# Patient Record
Sex: Female | Born: 1945 | Race: White | Hispanic: No | Marital: Married | Smoking: Former smoker
Health system: Southern US, Community
[De-identification: ages and names within clinical notes are randomized; demographics above are authoritative.]

## PROBLEM LIST (undated history)

## (undated) ENCOUNTER — Emergency Department (HOSPITAL_COMMUNITY): Admission: EM | Payer: Medicare Other | Source: Home / Self Care

## (undated) DIAGNOSIS — I509 Heart failure, unspecified: Secondary | ICD-10-CM

## (undated) DIAGNOSIS — T8859XA Other complications of anesthesia, initial encounter: Secondary | ICD-10-CM

## (undated) DIAGNOSIS — G4730 Sleep apnea, unspecified: Secondary | ICD-10-CM

## (undated) DIAGNOSIS — M8580 Other specified disorders of bone density and structure, unspecified site: Secondary | ICD-10-CM

## (undated) DIAGNOSIS — K219 Gastro-esophageal reflux disease without esophagitis: Secondary | ICD-10-CM

## (undated) DIAGNOSIS — M5431 Sciatica, right side: Secondary | ICD-10-CM

## (undated) DIAGNOSIS — D649 Anemia, unspecified: Secondary | ICD-10-CM

## (undated) DIAGNOSIS — M549 Dorsalgia, unspecified: Secondary | ICD-10-CM

## (undated) DIAGNOSIS — I4891 Unspecified atrial fibrillation: Secondary | ICD-10-CM

## (undated) DIAGNOSIS — Z5189 Encounter for other specified aftercare: Secondary | ICD-10-CM

## (undated) DIAGNOSIS — M1990 Unspecified osteoarthritis, unspecified site: Secondary | ICD-10-CM

## (undated) DIAGNOSIS — R7303 Prediabetes: Secondary | ICD-10-CM

## (undated) DIAGNOSIS — M5416 Radiculopathy, lumbar region: Secondary | ICD-10-CM

## (undated) DIAGNOSIS — S92302A Fracture of unspecified metatarsal bone(s), left foot, initial encounter for closed fracture: Secondary | ICD-10-CM

## (undated) DIAGNOSIS — Z950 Presence of cardiac pacemaker: Secondary | ICD-10-CM

## (undated) DIAGNOSIS — E213 Hyperparathyroidism, unspecified: Secondary | ICD-10-CM

## (undated) DIAGNOSIS — O009 Unspecified ectopic pregnancy without intrauterine pregnancy: Secondary | ICD-10-CM

## (undated) DIAGNOSIS — C541 Malignant neoplasm of endometrium: Secondary | ICD-10-CM

## (undated) DIAGNOSIS — I499 Cardiac arrhythmia, unspecified: Secondary | ICD-10-CM

## (undated) DIAGNOSIS — E785 Hyperlipidemia, unspecified: Secondary | ICD-10-CM

## (undated) DIAGNOSIS — I10 Essential (primary) hypertension: Secondary | ICD-10-CM

## (undated) DIAGNOSIS — I480 Paroxysmal atrial fibrillation: Secondary | ICD-10-CM

## (undated) DIAGNOSIS — J329 Chronic sinusitis, unspecified: Secondary | ICD-10-CM

## (undated) DIAGNOSIS — T7840XA Allergy, unspecified, initial encounter: Secondary | ICD-10-CM

## (undated) DIAGNOSIS — K589 Irritable bowel syndrome without diarrhea: Secondary | ICD-10-CM

## (undated) DIAGNOSIS — H269 Unspecified cataract: Secondary | ICD-10-CM

## (undated) DIAGNOSIS — F320 Major depressive disorder, single episode, mild: Secondary | ICD-10-CM

## (undated) DIAGNOSIS — R011 Cardiac murmur, unspecified: Secondary | ICD-10-CM

## (undated) HISTORY — DX: Dorsalgia, unspecified: M54.9

## (undated) HISTORY — PX: TOTAL KNEE ARTHROPLASTY: SHX125

## (undated) HISTORY — DX: Other specified disorders of bone density and structure, unspecified site: M85.80

## (undated) HISTORY — DX: Chronic sinusitis, unspecified: J32.9

## (undated) HISTORY — DX: Unspecified ectopic pregnancy without intrauterine pregnancy: O00.90

## (undated) HISTORY — DX: Sleep apnea, unspecified: G47.30

## (undated) HISTORY — DX: Fracture of unspecified metatarsal bone(s), left foot, initial encounter for closed fracture: S92.302A

## (undated) HISTORY — PX: CHOLECYSTECTOMY: SHX55

## (undated) HISTORY — DX: Irritable bowel syndrome without diarrhea: K58.9

## (undated) HISTORY — DX: Encounter for other specified aftercare: Z51.89

## (undated) HISTORY — DX: Malignant neoplasm of endometrium: C54.1

## (undated) HISTORY — DX: Allergy, unspecified, initial encounter: T78.40XA

## (undated) HISTORY — PX: NASAL SINUS SURGERY: SHX719

## (undated) HISTORY — PX: TONSILLECTOMY: SUR1361

## (undated) HISTORY — PX: ABDOMINAL HYSTERECTOMY: SHX81

## (undated) HISTORY — PX: TOTAL ABDOMINAL HYSTERECTOMY W/ BILATERAL SALPINGOOPHORECTOMY: SHX83

## (undated) HISTORY — PX: FINGER SURGERY: SHX640

## (undated) HISTORY — PX: DILATION AND CURETTAGE OF UTERUS: SHX78

## (undated) HISTORY — DX: Unspecified cataract: H26.9

## (undated) HISTORY — DX: Hyperparathyroidism, unspecified: E21.3

## (undated) HISTORY — DX: Sciatica, right side: M54.31

## (undated) HISTORY — DX: Cardiac murmur, unspecified: R01.1

## (undated) HISTORY — DX: Major depressive disorder, single episode, mild: F32.0

## (undated) HISTORY — DX: Radiculopathy, lumbar region: M54.16

## (undated) HISTORY — DX: Prediabetes: R73.03

## (undated) HISTORY — DX: Unspecified osteoarthritis, unspecified site: M19.90

## (undated) HISTORY — PX: FRACTURE SURGERY: SHX138

## (undated) HISTORY — DX: Anemia, unspecified: D64.9

## (undated) HISTORY — PX: JOINT REPLACEMENT: SHX530

## (undated) HISTORY — PX: EYE SURGERY: SHX253

---

## 1997-12-04 ENCOUNTER — Ambulatory Visit (HOSPITAL_COMMUNITY): Admission: RE | Admit: 1997-12-04 | Discharge: 1997-12-04 | Payer: Self-pay | Admitting: *Deleted

## 1999-01-14 ENCOUNTER — Ambulatory Visit (HOSPITAL_COMMUNITY): Admission: RE | Admit: 1999-01-14 | Discharge: 1999-01-14 | Payer: Self-pay | Admitting: *Deleted

## 2000-02-22 ENCOUNTER — Ambulatory Visit (HOSPITAL_COMMUNITY): Admission: RE | Admit: 2000-02-22 | Discharge: 2000-02-22 | Payer: Self-pay | Admitting: Neurosurgery

## 2000-02-22 ENCOUNTER — Encounter: Payer: Self-pay | Admitting: Neurosurgery

## 2000-03-07 ENCOUNTER — Ambulatory Visit (HOSPITAL_COMMUNITY): Admission: RE | Admit: 2000-03-07 | Discharge: 2000-03-07 | Payer: Self-pay | Admitting: Neurosurgery

## 2000-03-07 ENCOUNTER — Encounter: Payer: Self-pay | Admitting: Neurosurgery

## 2000-03-21 ENCOUNTER — Encounter: Payer: Self-pay | Admitting: Neurosurgery

## 2000-03-21 ENCOUNTER — Ambulatory Visit (HOSPITAL_COMMUNITY): Admission: RE | Admit: 2000-03-21 | Discharge: 2000-03-21 | Payer: Self-pay | Admitting: Neurosurgery

## 2000-04-11 ENCOUNTER — Encounter (INDEPENDENT_AMBULATORY_CARE_PROVIDER_SITE_OTHER): Payer: Self-pay

## 2000-04-11 ENCOUNTER — Other Ambulatory Visit: Admission: RE | Admit: 2000-04-11 | Discharge: 2000-04-11 | Payer: Self-pay | Admitting: *Deleted

## 2001-05-28 ENCOUNTER — Encounter: Payer: Self-pay | Admitting: Family Medicine

## 2001-05-28 ENCOUNTER — Encounter: Admission: RE | Admit: 2001-05-28 | Discharge: 2001-05-28 | Payer: Self-pay | Admitting: Family Medicine

## 2001-07-07 ENCOUNTER — Emergency Department (HOSPITAL_COMMUNITY): Admission: EM | Admit: 2001-07-07 | Discharge: 2001-07-07 | Payer: Self-pay | Admitting: Emergency Medicine

## 2002-01-02 DIAGNOSIS — C541 Malignant neoplasm of endometrium: Secondary | ICD-10-CM

## 2002-01-02 HISTORY — DX: Malignant neoplasm of endometrium: C54.1

## 2002-03-05 ENCOUNTER — Encounter: Admission: RE | Admit: 2002-03-05 | Discharge: 2002-03-05 | Payer: Self-pay | Admitting: *Deleted

## 2002-04-08 ENCOUNTER — Other Ambulatory Visit: Admission: RE | Admit: 2002-04-08 | Discharge: 2002-04-08 | Payer: Self-pay | Admitting: *Deleted

## 2002-05-02 ENCOUNTER — Encounter (INDEPENDENT_AMBULATORY_CARE_PROVIDER_SITE_OTHER): Payer: Self-pay | Admitting: *Deleted

## 2002-05-02 ENCOUNTER — Ambulatory Visit (HOSPITAL_COMMUNITY): Admission: RE | Admit: 2002-05-02 | Discharge: 2002-05-02 | Payer: Self-pay | Admitting: *Deleted

## 2002-05-21 ENCOUNTER — Encounter: Admission: RE | Admit: 2002-05-21 | Discharge: 2002-05-21 | Payer: Self-pay | Admitting: Internal Medicine

## 2002-05-21 ENCOUNTER — Encounter: Payer: Self-pay | Admitting: Internal Medicine

## 2002-05-21 ENCOUNTER — Encounter: Admission: RE | Admit: 2002-05-21 | Discharge: 2002-05-21 | Payer: Self-pay | Admitting: *Deleted

## 2002-06-06 ENCOUNTER — Inpatient Hospital Stay (HOSPITAL_COMMUNITY): Admission: RE | Admit: 2002-06-06 | Discharge: 2002-06-09 | Payer: Self-pay | Admitting: *Deleted

## 2002-06-06 ENCOUNTER — Encounter (INDEPENDENT_AMBULATORY_CARE_PROVIDER_SITE_OTHER): Payer: Self-pay | Admitting: Specialist

## 2002-07-08 ENCOUNTER — Ambulatory Visit: Admission: RE | Admit: 2002-07-08 | Discharge: 2002-07-08 | Payer: Self-pay | Admitting: Gynecology

## 2002-10-21 ENCOUNTER — Other Ambulatory Visit: Admission: RE | Admit: 2002-10-21 | Discharge: 2002-10-21 | Payer: Self-pay | Admitting: *Deleted

## 2002-12-29 ENCOUNTER — Encounter: Admission: RE | Admit: 2002-12-29 | Discharge: 2002-12-29 | Payer: Self-pay | Admitting: *Deleted

## 2003-01-13 ENCOUNTER — Other Ambulatory Visit: Admission: RE | Admit: 2003-01-13 | Discharge: 2003-01-13 | Payer: Self-pay | Admitting: *Deleted

## 2003-04-06 ENCOUNTER — Encounter: Admission: RE | Admit: 2003-04-06 | Discharge: 2003-04-06 | Payer: Self-pay | Admitting: *Deleted

## 2003-04-09 ENCOUNTER — Other Ambulatory Visit: Admission: RE | Admit: 2003-04-09 | Discharge: 2003-04-09 | Payer: Self-pay | Admitting: *Deleted

## 2003-07-03 ENCOUNTER — Encounter: Admission: RE | Admit: 2003-07-03 | Discharge: 2003-07-03 | Payer: Self-pay | Admitting: *Deleted

## 2003-10-11 ENCOUNTER — Emergency Department (HOSPITAL_COMMUNITY): Admission: EM | Admit: 2003-10-11 | Discharge: 2003-10-11 | Payer: Self-pay | Admitting: Emergency Medicine

## 2004-01-05 ENCOUNTER — Encounter (HOSPITAL_COMMUNITY): Admission: RE | Admit: 2004-01-05 | Discharge: 2004-04-04 | Payer: Self-pay | Admitting: *Deleted

## 2004-05-03 ENCOUNTER — Encounter: Admission: RE | Admit: 2004-05-03 | Discharge: 2004-05-03 | Payer: Self-pay | Admitting: *Deleted

## 2004-05-12 ENCOUNTER — Ambulatory Visit (HOSPITAL_COMMUNITY): Admission: RE | Admit: 2004-05-12 | Discharge: 2004-05-12 | Payer: Self-pay | Admitting: *Deleted

## 2005-02-22 ENCOUNTER — Encounter: Admission: RE | Admit: 2005-02-22 | Discharge: 2005-02-22 | Payer: Self-pay | Admitting: Internal Medicine

## 2005-05-31 ENCOUNTER — Ambulatory Visit (HOSPITAL_COMMUNITY): Admission: RE | Admit: 2005-05-31 | Discharge: 2005-05-31 | Payer: Self-pay | Admitting: *Deleted

## 2005-06-29 ENCOUNTER — Encounter: Admission: RE | Admit: 2005-06-29 | Discharge: 2005-06-29 | Payer: Self-pay | Admitting: *Deleted

## 2005-07-11 ENCOUNTER — Encounter: Admission: RE | Admit: 2005-07-11 | Discharge: 2005-10-09 | Payer: Self-pay | Admitting: Orthopedic Surgery

## 2006-04-03 ENCOUNTER — Ambulatory Visit (HOSPITAL_COMMUNITY): Admission: RE | Admit: 2006-04-03 | Discharge: 2006-04-03 | Payer: Self-pay | Admitting: Obstetrics & Gynecology

## 2006-07-25 ENCOUNTER — Encounter: Admission: RE | Admit: 2006-07-25 | Discharge: 2006-07-25 | Payer: Self-pay | Admitting: Obstetrics & Gynecology

## 2006-11-05 ENCOUNTER — Inpatient Hospital Stay (HOSPITAL_COMMUNITY): Admission: RE | Admit: 2006-11-05 | Discharge: 2006-11-08 | Payer: Self-pay | Admitting: Orthopedic Surgery

## 2006-11-27 ENCOUNTER — Encounter: Admission: RE | Admit: 2006-11-27 | Discharge: 2007-01-02 | Payer: Self-pay | Admitting: Orthopedic Surgery

## 2007-01-07 ENCOUNTER — Encounter: Admission: RE | Admit: 2007-01-07 | Discharge: 2007-04-07 | Payer: Self-pay | Admitting: Orthopedic Surgery

## 2007-04-10 ENCOUNTER — Ambulatory Visit (HOSPITAL_COMMUNITY): Admission: RE | Admit: 2007-04-10 | Discharge: 2007-04-10 | Payer: Self-pay | Admitting: Orthopedic Surgery

## 2007-06-18 ENCOUNTER — Ambulatory Visit (HOSPITAL_COMMUNITY): Admission: RE | Admit: 2007-06-18 | Discharge: 2007-06-18 | Payer: Self-pay | Admitting: Obstetrics & Gynecology

## 2007-10-10 ENCOUNTER — Encounter: Admission: RE | Admit: 2007-10-10 | Discharge: 2007-10-10 | Payer: Self-pay | Admitting: Obstetrics & Gynecology

## 2007-11-26 ENCOUNTER — Ambulatory Visit (HOSPITAL_COMMUNITY): Admission: RE | Admit: 2007-11-26 | Discharge: 2007-11-26 | Payer: Self-pay | Admitting: Gastroenterology

## 2008-10-28 ENCOUNTER — Encounter: Admission: RE | Admit: 2008-10-28 | Discharge: 2008-10-28 | Payer: Self-pay | Admitting: Obstetrics & Gynecology

## 2009-11-11 ENCOUNTER — Encounter: Admission: RE | Admit: 2009-11-11 | Discharge: 2009-11-11 | Payer: Self-pay | Admitting: Obstetrics & Gynecology

## 2010-05-17 NOTE — Discharge Summary (Signed)
Veronica Bradford, Veronica Bradford                 ACCOUNT NO.:  [id number]   MEDICAL RECORD NO.:  [id number]          PATIENT TYPE:  INP   LOCATION:  1606                         FACILITY:  [facility]   PHYSICIAN:  Ollen Gross, M.D.    DATE OF BIRTH:  Sep 19, 1945   DATE OF ADMISSION:  11/05/2006  DATE OF DISCHARGE:  11/08/2006                               DISCHARGE SUMMARY   ADMITTING DIAGNOSES:  1. Osteoarthritis right knee.  2. History of mild migraines.  3. Hypertension.  4. Hiatal hernia.  5. Reflux disease.  6. Past history of fluid retention following abdominal surgery.  7. Surgical menopause.   DISCHARGE DIAGNOSES:  1. Osteoarthritis right knee status post right total knee replacement      arthroplasty.  2. Mild postop blood loss anemia, did not require transfusion.  3. Postop volume overload, improved.  4. History of mild migraines.  5. Hypertension.  6. Hiatal hernia.  7. Reflux disease.  8. Past history of fluid retention following abdominal surgery.  9. Surgical menopause.   PROCEDURE:  November 05, 2006, right total knee.  Surgeon, Dr. Lequita Halt.  Assistant Avel Peace PA-C.   ANESTHESIA:  General.   CONSULTS:  None.   BRIEF HISTORY:  The patient is a 65 year old female with severe end-  stage arthritis of the right knee with progressive worsening pain and  dysfunction, failed non-operative management now presents for total knee  arthroplasty.   LABORATORY DATA:  Preop CBC showed hemoglobin 13.9, hematocrit 41.0,  white cell count 6.1. Chem panel slightly low potassium at 3.4.  Remaining Chem panel all within normal limits.  PT/INR preop 12.9 and  1.0 and PTT of 36.  Preop UA negative.  Serial CBCs were followed.  Hemoglobin did drop down to 11.3 then 10.2.  Last noted H and H 10.0 and  29.0.  Serial B-Mets were followed.  Potassium came up to 4.5, back down  to 3.7.   Chest x-ray October 30, 2006:  No active cardiopulmonary disease.   HOSPITAL COURSE:  The patient was  admitted to [facility],  tolerated procedure well.  Later transferred to the recovery room,  orthopedic floor, started on PCA and p.o. analgesic pain control after  surgery.  Actually was doing very well on the morning of day one.  She  is already doing straight leg raises.  Anticipate that she would do  really well.  She was noted to have a large volume overload over 3  liters.  Put her on strict Is and Os and started diuresing some of the  fluid.  She had a history of volume overload in the past with likely  third spacing so we wanted to keep her from getting into any pulmonary  issues.  Started diuresing and her output actually was very good.  She  was on lisinopril.  Her BUN and creatinine were fine.  We set that on  parameters, resumed her omeprazole.  Starting to get up out of bed on  day one.  By day two she was even doing better.  She had a rough night  on day one I think because she was up and about, a little bit better on  the morning of day two.  She developed migraines.  Started back on her  Imitrex.  DC the PCA, fluids and the Foley.  Dressing changed.  Incision  looked good.  She was able to get up and about with PT, walked about 120  feet by day three.  Migraine headache had improved.  She was doing well.  Incision looked good.  Weaned over to p.o. meds and discharged home.   DISCHARGE/PLAN:  1. The patient was discharged home on November 08, 2006.  2. Discharge diagnoses:  Please see above.  3. Discharge medications: Coumadin, Percocet, Robaxin.   DIET:  Resume home diet.   ACTIVITY:  She is weightbearing as tolerated right lower extremity.  Home health PT, home health nursing total knee protocol, daily dressing  change, do not submerge incision under water.  Follow-up 2 weeks.   DISPOSITION:  Home.   CONDITION ON DISCHARGE:  Improved.      Alexzandrew L. Perkins, P.A.C.      Ollen Gross, M.D.  Electronically Signed    ALP/MEDQ  D:  11/08/2006   T:  11/08/2006  Job:  [id number]   cc:   Ollen Gross, M.D.  Fax: [phone]   Theressa Millard, M.D.  Fax: [phone]

## 2010-05-17 NOTE — H&P (Signed)
Veronica Bradford, Veronica Bradford                 ACCOUNT NO.:  [id number]   MEDICAL RECORD NO.:  [id number]          PATIENT TYPE:  INP   LOCATION:  NA                           FACILITY:  [facility]   PHYSICIAN:  Ollen Gross, M.D.    DATE OF BIRTH:  1945/01/04   DATE OF ADMISSION:  11/05/2006  DATE OF DISCHARGE:                              HISTORY & PHYSICAL   CHIEF COMPLAINT:  Right knee pain.   HISTORY OF PRESENT ILLNESS:  The patient is a 65 year old female who has  been seen by Dr. Lequita Halt for ongoing right knee pain.  She has been  treated for knee problems for multiple years now.  She has had a knee  arthroscopy about 10 years ago.  For the past 2-3 years her knees have  been getting worse.  She has had injections including viscous  supplementation without significant relief.  She is found in the office  to have significant arthritis with bone-on-bone medial compartment also  patellofemoral spurring.  It is felt she has reached a point where she  would benefit undergoing surgery.  Risks and benefits been discussed.  She elects to proceed with surgery.   ALLERGIES:  PERCOGESIC CAUSES RASH.  AMOXICILLIN CAUSES RASH.   CURRENT MEDICATIONS:  1. Lisinopril/HCT, lovastatin, aspirin, Vicodin, guaifenesin,      omeprazole, Flonase nose spray.   PAST MEDICAL HISTORY:  1. History of mild migraines.  2. Hypertension.  3. Hiatal hernia.  4 . Reflux disease.  1. Past history of fluid retention following one of her abdominal      surgeries.  2. Surgical menopause.   PAST SURGICAL HISTORY:  1. Right knee surgery.  2. Hysterectomy with BSO.  3. Cholecystectomy.  4. Tonsils/adenoids.   SOCIAL HISTORY:  Married.  She is a Engineer, civil (consulting).  Works at [employer].  [place].  Four ounces of alcohol 3-5 times a week.  One child.  Husband and son will be assisting with care after surgery.   FAMILY HISTORY:  Mother with breast cancer and hypertension.  Father  with hypertension, diabetes.   REVIEW OF  SYSTEMS:  GENERAL:  No fevers, chills, night sweats.  NEUROLOGICAL:  No seizures, syncope, paralysis.  RESPIRATORY:  No  shortness of breath, productive cough or hemoptysis.  CARDIOVASCULAR:  No chest pain, or orthopnea.  GI:  No nausea or constipation.  GU: No  dysuria, hematuria or discharge.  MUSCULOSKELETAL:  Right knee.   PHYSICAL EXAMINATION:  VITAL SIGNS:  Pulse 72, respirations 12, blood  pressure 136/74.  GENERAL:  A 65 year old white female, well-nourished, well-developed, no  acute distress.  She is overweight, obese, alert, oriented, cooperative,  very pleasant, excellent historian.  HEENT: Normocephalic, atraumatic.  Pupils are reactive.  Oropharynx  clear.  EOMs intact.  NECK:  Supple.  No bruits.  CHEST:  Clear anterior posterior chest walls.  No rhonchi, rales or  wheezing.  HEART:  Regular rate and rhythm.  No murmur.  ABDOMEN:  Soft.  Bowel sounds present, nontender.  RECTAL/BREASTS/GENITALIA:  Not done, not pertinent to present illness.  EXTREMITIES:  Right knee no effusion,  marked crepitus is noted.  Range  of motion 5-125, tender more medial than lateral.   IMPRESSION:  1. Osteoarthritis right knee.  2. History of mild migraines.  3. Hypertension.  4. Hiatal hernia.  5. Reflux disease.  6. Past history of fluid retention following abdominal surgery.  7. Surgical menopause.   PLAN:  The patient was admitted to [facility] to undergo a  right total knee replacement arthroplasty.  Surgery will be performed by  Ollen Gross.      Alexzandrew L. Perkins, P.A.C.      Ollen Gross, M.D.  Electronically Signed    ALP/MEDQ  D:  10/31/2006  T:  10/31/2006  Job:  [id number]   cc:   Theressa Millard, M.D.  Fax: [phone]   Ollen Gross, M.D.  Fax: [phone]

## 2010-05-17 NOTE — Op Note (Signed)
[name], Bradford                 ACCOUNT NO.:  [id number]   MEDICAL RECORD NO.:  [id number]          PATIENT TYPE:  INP   LOCATION:  0004                         FACILITY:  [facility]   PHYSICIAN:  Ollen Gross, M.D.    DATE OF BIRTH:  21-May-1945   DATE OF PROCEDURE:  11/05/2006  DATE OF DISCHARGE:                               OPERATIVE REPORT   PREOPERATIVE DIAGNOSIS:  Osteoarthritis right knee.   POSTOPERATIVE DIAGNOSIS:  Osteoarthritis right knee.   PROCEDURE:  Right total knee arthroplasty.   SURGEON:  Ollen Gross, M.D.   ASSISTANT:  Avel Peace PA-C   ANESTHESIA:  General with postop Marcaine pain pump.   ESTIMATED BLOOD LOSS:  Minimal.   DRAINS:  None.   TOURNIQUET TIME:  40 minutes at 300 mmHg.   COMPLICATIONS:  None.   CONDITION:  Stable to recovery.   BRIEF CLINICAL NOTE:  Veronica Bradford is a 65 year old female who has severe end-  stage arthritis of the right knee with progressively worsening pain and  dysfunction.  She has failed nonoperative management and presents for  total knee arthroplasty.   PROCEDURE IN DETAIL:  After successful administration of general  anesthetic, a tourniquet was placed high on the right thigh and her  right lower extremity prepped and draped in usual sterile fashion.  Extremity was wrapped in Esmarch, knee flexed, tourniquet inflated to  300 mmHg.  Midline incision made with a 10 blade through subcutaneous  tissue to level the extensor mechanism.  A fresh blade is used make a  medial parapatellar arthrotomy.  Soft tissue over the proximal and  medial tibia subperiosteally elevated to the joint line with the knife  and into the semimembranosus bursa with a Cobb elevator.  Soft tissue  laterally was elevated with attention being paid to avoid the patellar  tendon on tibial tubercle.  The patella subluxed laterally, knee flexed  90 degrees, ACL and PCL removed.  Drill was used to create a starting  hole in distal femur and the canal was  thoroughly irrigated.  The 5  degree right valgus alignment guide is placed referencing off the  posterior condyles, rotations marked and a block pinned to remove 11 mm  of the distal femur.  I took 11 because she had a preop flexion  contracture.  Distal femoral resection is made with an oscillating saw.  Sizing blocks placed, size 3 is most appropriate.  Rotations marked at  the epicondylar axis.  Size 3 cutting block was placed and the anterior-  posterior and chamfer cuts were made.   Tibia subluxed forward and the menisci are removed.  Extramedullary  tibial alignment guides placed referencing proximally at the medial  aspect of the tibial tubercle and distally along the second metatarsal  axis and tibial crest.  The block is pinned to remove about 10 mm of the  non deficient lateral side.  Tibial resection is made with an  oscillating saw.  Size 3 is the most appropriate tibial component and  the proximal tibia is prepared with a modular drill and keel punch for a  size  3.  Femoral preparation is completed with the intercondylar cut.   Size 3 mobile bearing tibial trial, size 3 posterior stabilized femoral  trial and a 10-mm posterior stabilized rotating platform insert trial  are placed.  With a 20 there is a tiny bit of varus-valgus plane and a  tiny bit of hyperextension.  With a 12.5 which allowed for full  extension with excellent varus and valgus balance throughout full range  of motion.  This also has excellent AP balance.  The patella was everted  and thickness measured to be 22 mm.  Freehand resection taken to 12 mm,  35 template is placed, lug holes were drilled, trial patella was placed  and it tracks normally.  Osteophytes were removed off the posterior  femur with a trial in place.  All trials removed and the cut bone  surfaces are prepared with pulsatile lavage.  Cement was mixed and once  ready for implantation, the size 3 mobile bearing tibial tray, size 3   posterior stabilized femur and the 35 patella are cemented into position  and patella is held with the clamp.  The trial 12.5-mm inserts placed,  knee held in full extension and all extruded cement removed.  When the  cement fully hardened then the permanent 12.5 mm posterior stabilized  rotating platform insert is placed into the tibial tray.  The wound was  copiously irrigated with saline solution and the FloSeal was injected  onto the posterior capsule into the mediolateral gutters and  suprapatellar area.  Moist sponge was placed and the tourniquet released  with total time of 40 minutes.  We held the sponge of couple minutes  then removed it and there was minimal bleeding encountered.  That which  was encountered was stopped with electrocautery.  We irrigated the joint  and then closed the arthrotomy with interrupted #1 PDS.  Flexion against  gravity to 135 degrees.  Subcu was closed with interrupted 2-0 Vicryl  and subcuticular running 4-0 Monocryl.  The catheter for Marcaine pain  pump is placed and the pump initiated.  Steri-Strips and bulky sterile  dressing are applied and she is placed into a knee immobilizer, awakened  and transferred to recovery in stable condition.      Ollen Gross, M.D.  Electronically Signed     FA/MEDQ  D:  11/05/2006  T:  11/06/2006  Job:  [id number]

## 2010-05-17 NOTE — Op Note (Signed)
NAMEMELISSE, CAETANO                 ACCOUNT NO.:  [id number]   MEDICAL RECORD NO.:  [id number]          PATIENT TYPE:  AMB   LOCATION:  ENDO                         FACILITY:  [facility]   PHYSICIAN:  Petra Kuba, M.D.    DATE OF BIRTH:  Sep 03, 1945   DATE OF PROCEDURE:  DATE OF DISCHARGE:                               OPERATIVE REPORT   PROCEDURE:  Colonoscopy.   INDICATIONS:  Family history of colon cancer due for colonic screening.  Consent was signed after risks, benefits, methods and options thoroughly  discussed by Dr. Matthias Hughs prior to any sedation.   MEDICINES USED:  Fentanyl 100 mcg, Versed 10 mg.   DESCRIPTION OF PROCEDURE:  Rectal inspection was pertinent for small  external hemorrhoids.  Digital exam was negative.  The video colonoscope  was inserted and with some difficulty due to a long looping colon.  With  rolling her on her back and abdominal pressure was able to advance to  the cecum which was identified by the appendiceal orifice and ileocecal  valve.  On insertion, some left-sided scattered diverticula were seen  but no other abnormalities.  The scope was slowly withdrawn.  The prep  was adequate.  There was some liquid stool that required washing and  suctioning.  On slow withdrawal through the colon other than left-sided  diverticula, no polyps, tumors or masses were seen.  Once back in the  rectum, anorectal pull-through and retroflexion confirmed some small  hemorrhoids.  The scope was straightened and readvanced a short ways up  the left side of the colon.  Air was suctioned, the scope removed.  The  patient tolerated the procedure well.  There was no obvious immediate  complication.   ENDOSCOPIC DIAGNOSES:  1. Internal-external small hemorrhoids.  2. Left-sided diverticula to a proximal splenic flexure.  3. Otherwise within normal limits to the cecum.   PLAN:  Follow-up with either myself or [facility] p[facility].  Otherwise, repeat screening in 5  years.  Otherwise, return care to Dr.  Earl Gala.           ______________________________  Petra Kuba, M.D.     MEM/MEDQ  D:  11/26/2007  T:  11/26/2007  Job:  [id number]   cc:   Bernette Redbird, M.D.  Fax: [phone]   Theressa Millard, M.D.  Fax: [phone]

## 2010-05-20 NOTE — Op Note (Signed)
NAMERHYAN, WOLTERS                             ACCOUNT NO.:  [id number]   MEDICAL RECORD NO.:  [id number]                   PATIENT TYPE:  AMB   LOCATION:  DAY                                  FACILITY:  [facility]   PHYSICIAN:  Pershing Cox, M.D.            DATE OF BIRTH:  [birth year]   DATE OF PROCEDURE:  05/02/2002  DATE OF DISCHARGE:                                 OPERATIVE REPORT   PREOPERATIVE DIAGNOSES:  Dysfunctional uterine bleeding, enlarging uterine  myomas.   POSTOPERATIVE DIAGNOSES:  Dysfunctional uterine bleeding, enlarging uterine  myomas and submucosal myomas.   ANESTHESIA:  MAC plus Marcaine paracervical block.   SURGEON:  Pershing Cox, M.D.   INDICATIONS FOR PROCEDURE:  Veronica Bradford is 65 years old. She has begun to  have menstrual cycle irregularity and when seen for her annual examination,  her history revealed that she had bled almost the entire month of March. She  has a long history of dysfunctional uterine bleeding and had a normal biopsy  in the past. She had regular menstrual periods every other month in 2002 and  2003. Her last normal menstrual period was in December and no bleeding until  March when she began to have excessive menstrual bleeding. Sonogram showed a  uterus which was enlarged from previous studies and had a new uterine myoma.  The endometrial stripe was thickened. A decision was made to proceed with  D&C hysteroscopy to evaluate her uterine lining in preparation for  anticipated surgery.   FINDINGS:  Examination under anesthesia shows the uterus to be approximately  14 weeks in size and irregular in shape. The endometrial cavity sounds to 11  1/2 cm. On curettage, the walls of the endometrial canal are irregular  consistent with submucosal myoma.   DESCRIPTION OF PROCEDURE:  Veronica Bradford was brought to the operating room  with an IV in place. She received an gram of Ancef in the holding area. She  was placed supine on the OR  table and IV sedation was administered. She was  placed in Buxton stirrups and examination under anesthesia was performed.  Hibiclens was used to prep the vagina, perineum, lower abdomen, and upper  thigh. The patient was then draped with sterile linen for a vaginal  procedure. A collecting drape was placed beneath the buttock so there would  measured affluent during the procedure.   A large bivalve Graves speculum was inserted into the vagina, cervix was  visualized and anterior cervix was anesthetized with 0.25% Marcaine. A  single tooth tenaculum was used to grasp the anterior cervix. A Kevorkian  curette was used to obtain endocervical curetting. The uterine sound then  passed to a depth of 11-1/2 cm with 4-3/4 inches. Serial Pratt dilators were  used to dilate the cervix. It should be noted the cervix was very soft.  There was no difficulty with the dilatation. The hysteroscope was  introduced  into the cavity and attempt was made to distend the cavity. There was a huge  amount of tissue that was visualized and the cavity could not be distended  using normal pressures. Affluent was turned off and the pressure was raised  to 90 and still I could not get good visualization of the uterine fundus  because of the extensive amount of tissue. Hysteroscopy was aborted. Small  and large sharp curettes were used then to obtain tissue from the walls of  the endometrium at first using long strips and then exploration. The walls  both anterior and posterior showed irregularities which were suggestive of  submucosal myoma. No attempt was made to revisualize. The sound passed again  to a depth of 11 1/2 cm and the procedure was terminated.   Sterile urine was collected at the beginning of the procedure using a red  rubber catheter. This is to assess her urine as she has a low grade  temperature and an elevated white count of 11,000.                                               Pershing Cox,  M.D.    MAJ/MEDQ  D:  05/02/2002  T:  05/02/2002  Job:  [id number]

## 2010-05-20 NOTE — Consult Note (Signed)
NAME:  Veronica Bradford, Veronica Bradford                     ACCOUNT NO.:  [id number]   MEDICAL RECORD NO.:  [id number]                   PATIENT TYPE:  OUT   LOCATION:  GYN                                  FACILITY:  [facility]   PHYSICIAN:  De Blanch, M.D.         DATE OF BIRTH:  [birth year]   DATE OF CONSULTATION:  07/08/2002  DATE OF DISCHARGE:                                   CONSULTATION   REASON FOR CONSULTATION:  The patient is a 65 year old white female seen in  consultation at the request of Dr. Gildardo Griffes.  Her past history is  outlined in Dr. Argentina Ponder very detailed letter of June 16, 2002.  Because of  uncontrolled bleeding, the patient underwent a total abdominal hysterectomy,  bilateral salpingo-oophorectomy, peritoneal washings, and on June 06, 2002,  at the same time she had a cholecystectomy with findings of chronic  cholecystitis and cholelithiasis.  Initially, the cytology of the peritoneal  fluid was interpreted as showing malignant cells, although initial  interpretation of the hysterectomy specimen was considered to show complex  hyperplasia without atypia.  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  Because of the uncertainty as to this constellation of diagnoses, the  pathology was reviewed at [facility] by Dr. Ursula Beath  and his colleagues.  Dr. 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   IMPRESSION:  Stage 1A grade 1 endometrial adenocarcinoma.   PLAN:  I had a lengthy discussion with the patient and her husband regarding  the difference in the pathology reports and interpretations.  Given the  favorable report from [facility], I believe we should  manage the patient as if she had a stage 1A grade 1 endometrial cancer,  although closer surveillance  would be reasonable, including the possibility  of repeating a CAT scan or peritoneal washings at some point in the future.  I did explain to them the likelihood that cholecystitis might result in  reactive mesothelial cells which might be interpreted as atypical or  malignant.  The subtle differences between hyperplasia and endometrial  cancer were also discussed.  At the conclusion of the consultation, the  patient and her husband's questions were answered.  They will return to the  care of Dr. Carey Bullocks and will follow her recommendations.                                               De Blanch, M.D.    DC/MEDQ  D:  07/08/2002  T:  07/08/2002  Job:  [id number]   cc:   Pershing Cox, M.D.  [address]  [address]  [place]  [place] [address]  Fax: [phone]  Telford Nab, R.N.  [address]  [place], [place] [address]   Sandria Bales. Ezzard Standing, M.D.  [address]  [place]  [place] [address]  Fax: [phone]

## 2010-05-20 NOTE — Discharge Summary (Signed)
Veronica Bradford                             ACCOUNT NO.:  [id number]   MEDICAL RECORD NO.:  [id number]                   PATIENT TYPE:  INP   LOCATION:  7673                                 FACILITY:  [facility]   PHYSICIAN:  Pershing Cox, M.D.            DATE OF BIRTH:  02/06/1945   DATE OF ADMISSION:  06/06/2002  DATE OF DISCHARGE:  06/09/2002                                 DISCHARGE SUMMARY   ADMISSION DIAGNOSES:  Menorrhagia and uterine myomas.   HISTORY OF PRESENT ILLNESS:  For details of the patient's admission history  and physical, see the note transcribed and dated June 06, 2002. In brief, she  is 65 years old. She has a long history of dysfunctional uterine bleeding  and recently this has become quite heavy with the development of anemia. She  has been treated with Lupron and Progesterone to control this bleeding. She  is brought to the operating room today to have her uterus, fallopian tubes,  and ovaries removed. She has a family history of ovarian cancer. The patient  also had an abdominal pelvic CT scan performed by her family doctor which  showed gallstones. She has elected to undergo a laparoscopic cholecystectomy  prior to her hysterectomy.   HOSPITAL COURSE:  On the day of admission, Verenis Nicosia was taken to the  operating room and, under general anesthesia, laparoscopic cholecystectomy,  exploratory laparotomy, total abdominal hysterectomy, right salpingo-  oophorectomy were performed. The patient had a 12 week globular uterus with  frozen section suggesting adenomyosis.   The patient's immediate postoperative course was uncomplicated. She had good  urine output on the first postoperative day. Her pain control was adequate.  On the morning of postoperative day #1, I began Toradol. The patient was  doing well then except for a migraine headache which responded to nasal  Imitrex. She began on sips of clear liquids that day. The patient had a  significant  intraoperative blood loss but had no problems in the  postoperative period with evidence of blood loss.   Once she began to take sips of water and then advancing to clear liquids,  she was able to advance to a diet quite rapidly. She was able to tolerate  p.o. pain medication without difficulty. On postoperative day #2, she had  some drainage from her incision. This was prepped and probed and felt to be  a wound seroma.   When seen on postoperative day #3, she complained of low back pain but,  otherwise, no other problems. She had had flatus. She had no nausea.   DISPOSITION:  She was allowed to be discharged on postoperative day #3 with  prescriptions for Peri-Colace while on Tylox #40, Darvocet #40, Toradol 10  mg #6 and the Vivelle-Dot for estrogen replacement. Final pathology is  pending at the time of this dictation.  Pershing Cox, M.D.    MAJ/MEDQ  D:  06/13/2002  T:  06/14/2002  Job:  [id number]

## 2010-05-20 NOTE — Op Note (Signed)
Veronica Bradford                             ACCOUNT NO.:  [id number]   MEDICAL RECORD NO.:  [id number]                   PATIENT TYPE:  INP   LOCATION:  X001                                 FACILITY:  [facility]   PHYSICIAN:  Pershing Cox, M.D.            DATE OF BIRTH:  [birth year]   DATE OF PROCEDURE:  06/06/2002  DATE OF DISCHARGE:                                 OPERATIVE REPORT   PREOPERATIVE DIAGNOSES:  Intractable menorrhagia and uterine myoma.   POSTOPERATIVE DIAGNOSES:  Intractable menorrhagia, presumed adenomyosis.   PROCEDURE:  Exploratory laparotomy, total abdominal hysterectomy, right  salpingo-oophorectomy.   ANESTHESIA:  General endotracheal.   SURGEON:  Pershing Cox, M.D.   ASSISTANT:  Genia Del, M.D.   INDICATIONS FOR PROCEDURE:  Veronica Bradford is 65 years old. She has a long history of  dysfunctional uterine bleeding and it has lead to anemia on several  occasions. She is admitted today for total abdominal hysterectomy and right  salpingo-oophorectomy.   Veronica Bradford was brought to the operating room by Dr. Ovidio Kin who was  performing a laparoscopic cholecystectomy. She was prepped with Hibiclens.  Her vagina was prepped and a Foley catheter was sterilely inserted into her  bladder by the surgeon. Laparoscopic cholecystectomy was then performed.  Once Dr. Ezzard Standing had finished his case, we then proceeded with the total  abdominal hysterectomy.   Veronica Bradford was inserted into the subumbilical tissues extending to the  symphysis and approximately 4 cm below the umbilicus. The skin was incised  with a knife and carried down with blunt and sharp dissection with to the  fascia which was opened to expose underlying rectus muscles. The peritoneum  was opened atraumatically without injury to underlying tissues. A large  Balfour retractor was inserted and laps were placed in the abdomen to  elevate the bowel out of the pelvis.   The patient's uterus was huge.  There was a large side to side expansion and  it was very difficult to visualize. Long Kelly clamps were placed along the  adnexal structures. The round ligaments were identified, suture ligated and  transected. On the patient's left where there was no adnexa, a clamp was  placed along the peritoneal surfaces staying far away from the bowel. This  was incised and suture ligated. On the patient's right after the round had  been transected, the peritoneum along the infundibulopelvic ligament was  incised. The ureter was visualized and the IP ligament was clamped, cut,  suture and free tie ligated. Using the ovary on traction, I was able to  separate the ovary from the posterior broad ligament and lift it onto the  uterus. We opened the peritoneum overlying the lower uterine segment and  dissected along the cervix and lower uterine segment to separate the bladder  in this area.   The uterine arteries were clamped on each side of the specimen doubly. Each  of these pedicles was suture ligated. The second pedicle was suture ligated  and tied around the first for double ligature. There was a lot of bleeding  that ensued after the uterine arteries were taken. This was primarily back  bleeding on the left. We were able to halt some of this bleeding by putting  sutures into the substance of the uterus to halt the bleeding. Straight  Masterson clamps were used to separate the lower uterine segment and cervix  from the cardinal ligament. Beneath the cervix, right angled Masterson  clamps were placed. The specimen was excised from the upper vagina. All  pedicles along the way were Heaney ligated. Angled sutures were placed on  each side of the vagina and held. There was bleeding along the posterior  vaginal wall where the rectosigmoid had been adherent in this area. The  posterior vaginal wall was then sutured to the mucosa over the rectosigmoid  to secure this area and make it hemostatic. The vagina  was closed by  interrupted sutures closing from front to back. Hemostasis was obtained by  using cautery and where appropriate small 3-0 Vicryl sutures. Hemostasis was  complete at the end of the procedure.   The laps were removed. The anterior abdominal wall was closed with  interrupted Novofil suture using a Smead-Jones closure. The subcutaneous  tissues were approximated using interrupted 2-0 Vicryl stitches and skin  staples were applied. We closed the umbilical incision which Dr. Ezzard Standing had  left open in case our incision extended into this area. This was also closed  with skin staples.   Estimated blood loss 1000 mL. (This includes the washings that were placed  at the time of Dr. Allene Pyo surgery and not retrieved. This may be as high  as 500 mL of fluid.) Urine output 200 mL. Fluids, 3800 mL of crystalloid and  500 mL of Hespan.                                               Pershing Cox, M.D.    MAJ/MEDQ  D:  06/06/2002  T:  06/06/2002  Job:  [id number]   cc:   Theressa Millard, M.D.  [address] [place]  [place] [address]  Fax: [phone]   Sandria Bales. Ezzard Standing, M.D.  [address]  [place]  [place] [address]  Fax: [phone]   Genia Del, M.D.  [address]  [place]  [place] [address]  Fax: [phone]

## 2010-05-20 NOTE — Op Note (Signed)
Veronica Bradford, Veronica Bradford                             ACCOUNT NO.:  [id number]   MEDICAL RECORD NO.:  [id number]                   PATIENT TYPE:  INP   LOCATION:  X001                                 [facility]ACILITY[facility]  [facility]   PHYSICIAN:  Sandria Bales. Ezzard Standing, M.D.               DATE OF BIRTH:  12/21/1945   DATE OF PROCEDURE:  06/06/2002  DATE OF DISCHARGE:                                 OPERATIVE REPORT   PREOPERATIVE DIAGNOSES:  Chronic cholecystitis with cholelithiasis and  enlarged uterus.   POSTOPERATIVE DIAGNOSES:  Chronic cholecystitis with cholelithiasis and  enlarged uterus.   PROCEDURE:  Part one of this operation is a laparoscopic cholecystectomy  with intraoperative cholangiogram.   SURGEON:  Sandria Bales. Ezzard Standing, M.D.   FIRST ASSISTANT:  Pershing Cox, M.D.   ANESTHESIA:  General endotracheal.   ESTIMATED BLOOD LOSS:  Minimal.   INDICATIONS FOR PROCEDURE:  Veronica Bradford is a 65 year old white female who has  an enlarged uterus and a planned hysterectomy by Dr. Gildardo Griffes for  this enlarged uterus and menorrhagia. She had a CT scan of her abdomen which  revealed cholelithiasis and the patient actually gives a history of symptoms  from chronic GI dyspepsia which may very well be related to her gallbladder.  She comes for attempted laparoscopic cholecystectomy as the first portion of  this operation with Dr. Carey Bullocks doing an open hysterectomy and resection of  her remaining ovary at the second part of the operation. The indications and  potential complications were explained to the patient. The potential  complications included but not limited to infection, bleeding, bowel injury,  bile duct injury, and open gallbladder surgery.   The patient presents to the operating room, she had 1 g of Ancef at the  initiation of the procedure, PAS stockings in place, a Foley catheter in  place, oral gastric tube in place and her abdomen was prepped with  Hibiclens.   Four trocars were  placed, an infraumbilical trocar with a 12 mm Hasson  trocar secured with a #0 Vicryl suture, a 10 mm subxiphoid trocar and then  two 5 mm trocars in the right mid subcostal area.   Abdominal exploration with the laparoscope revealed right and left lobes of  the liver unremarkable, the stomach mildly dilated with some air in it. We  could see enlarged uterus down in her pelvis. The remainder of her bowel was  covered much with omentum. First because of concern by Dr. Carey Bullocks of this  being a potential malignancy, we irrigated the abdomen with about a liter of  saline, we covered about 600 mL for peritoneal washings and these were  aspirated both over the right lobe of the liver and down in the pelvis over  the uterus. After this was completed, we grabbed the gallbladder, rotated it  cephalad, dissection carried out along the gallbladder cystic duct junction.  The patient  had some attachments of the duodenum up to the gallbladder and  had fatty infiltration along the neck of the gallbladder and this was  dissected free. Intraoperative cholangiogram was obtained using a cut off  taut catheter which was inserted through a 14 gauge Jelco and it was decided  to cut the cystic duct and the taut catheter was secured with a hemoclip.   Using 1/2 strength Hypaque solution, I injected about 6 mL and showed free  flow of contrast down the common bile duct into the duodenum refluxing up  the hepatic radicles and this was felt to be a normal intraoperative  cholangiogram.   The taut catheter was then removed, the cystic duct then triply endoclipped  and divided. The cystic artery was identified, triply endoclipped and  divided. I then sharply and bluntly dissected the gallbladder from the  gallbladder bed, it showed the triangle of Calot well. The gallbladder bed  was slowly and sharply dissected from the gallbladder bed. Prior to complete  division of the gallbladder from the gallbladder bed, I  reinspected the  gallbladder bed and the triangle of Calot and there was no bleeding or bile  leak. I then divided the gallbladder from the gallbladder bed, placed the  gallbladder in the endocatch bag and delivered it through the umbilicus.   I then removed each trocar in each turn, there was no bleeding from any  trocar site. The umbilical trocar was closed with a #0 Vicryl suture, the  skin was closed with skin staples.   At this time, the patient was placed in Trendelenburg position and Dr.  Carey Bullocks started her hysterectomy through a lower midline abdominal incision,  she will dictate this portion of the operation.   Again this is the first portion of this operation, the patient did well to  this point with Dr. Carey Bullocks assuming care from here.                                               Sandria Bales. Ezzard Standing, M.D.    DHN/MEDQ  D:  06/06/2002  T:  06/06/2002  Job:  [id number]   cc:   Pershing Cox, M.D.  [address]  [place]  [place] [address]  Fax: [phone]   Theressa Millard, M.D.  [address] [place]  [place] [address]  Fax: [phone]   Candace Cruise, M.D.

## 2010-10-07 ENCOUNTER — Other Ambulatory Visit: Payer: Self-pay | Admitting: Obstetrics & Gynecology

## 2010-10-07 DIAGNOSIS — Z1231 Encounter for screening mammogram for malignant neoplasm of breast: Secondary | ICD-10-CM

## 2010-10-11 LAB — CBC
HCT: 29 — ABNORMAL LOW
HCT: 29.8 — ABNORMAL LOW
Hemoglobin: 10.2 — ABNORMAL LOW
Hemoglobin: 11.3 — ABNORMAL LOW
MCHC: 34.1
MCHC: 34.4
MCV: 83.6
MCV: 85
Platelets: 201
Platelets: 224
RBC: 3.51 — ABNORMAL LOW
RDW: 15.7 — ABNORMAL HIGH
WBC: 11.8 — ABNORMAL HIGH
WBC: 8.2

## 2010-10-11 LAB — TYPE AND SCREEN

## 2010-10-11 LAB — BASIC METABOLIC PANEL
BUN: 8
CO2: 28
Chloride: 104
Creatinine, Ser: 0.7
GFR calc Af Amer: >60
GFR calc Af Amer: >60
Glucose, Bld: 133 — ABNORMAL HIGH
Potassium: 4.5
Sodium: 137

## 2010-10-11 LAB — ABO/RH: ABO/RH(D): A POS

## 2010-10-12 LAB — URINALYSIS, ROUTINE W REFLEX MICROSCOPIC
Glucose, UA: NEGATIVE
Hgb urine dipstick: NEGATIVE
Protein, ur: NEGATIVE

## 2010-10-12 LAB — COMPREHENSIVE METABOLIC PANEL
BUN: 11
CO2: 30
Chloride: 100
Creatinine, Ser: 0.68
GFR calc non Af Amer: >60
Glucose, Bld: 106 — ABNORMAL HIGH
Total Bilirubin: 0.8

## 2010-10-12 LAB — CBC
HCT: 41
Hemoglobin: 13.9
MCV: 84.9
Platelets: 257
RBC: 4.83
WBC: 6.1

## 2010-10-12 LAB — PROTIME-INR: INR: 1

## 2010-10-12 LAB — APTT: aPTT: 36

## 2010-11-16 ENCOUNTER — Ambulatory Visit: Payer: Self-pay

## 2010-11-18 ENCOUNTER — Ambulatory Visit
Admission: RE | Admit: 2010-11-18 | Discharge: 2010-11-18 | Disposition: A | Discharge status: Home / Self Care | Payer: 59 | Source: Ambulatory Visit | Attending: Obstetrics & Gynecology | Admitting: Obstetrics & Gynecology

## 2010-11-18 DIAGNOSIS — Z1231 Encounter for screening mammogram for malignant neoplasm of breast: Secondary | ICD-10-CM

## 2011-01-06 ENCOUNTER — Encounter: Payer: Self-pay | Admitting: *Deleted

## 2011-01-06 ENCOUNTER — Emergency Department (INDEPENDENT_AMBULATORY_CARE_PROVIDER_SITE_OTHER)
Admission: EM | Admit: 2011-01-06 | Discharge: 2011-01-06 | Disposition: A | Discharge status: Home / Self Care | Payer: 59 | Source: Home / Self Care | Attending: Emergency Medicine | Admitting: Emergency Medicine

## 2011-01-06 DIAGNOSIS — N390 Urinary tract infection, site not specified: Secondary | ICD-10-CM

## 2011-01-06 DIAGNOSIS — R300 Dysuria: Secondary | ICD-10-CM

## 2011-01-06 HISTORY — DX: Hyperlipidemia, unspecified: E78.5

## 2011-01-06 HISTORY — DX: Essential (primary) hypertension: I10

## 2011-01-06 HISTORY — DX: Gastro-esophageal reflux disease without esophagitis: K21.9

## 2011-01-06 LAB — POCT URINALYSIS DIPSTICK
Bilirubin, UA: NEGATIVE
Glucose, UA: NEGATIVE
Ketones, UA: NEGATIVE
pH, UA: 6 (ref 5–8)

## 2011-01-06 MED ORDER — PHENAZOPYRIDINE HCL 200 MG PO TABS: 200.0000 mg | ORAL_TABLET | Freq: Three times a day (TID) | ORAL | Status: AC

## 2011-01-06 MED ORDER — CIPROFLOXACIN HCL 500 MG PO TABS: 500.0000 mg | ORAL_TABLET | Freq: Two times a day (BID) | ORAL | Status: AC

## 2011-01-06 NOTE — ED Notes (Signed)
Pt c/o dyuria and frequent urination x 1 day.

## 2011-01-06 NOTE — ED Provider Notes (Signed)
History     CSN: [id number]  Arrival date & time 01/06/11  1929   First MD Initiated Contact with Patient 01/06/11 1930      No chief complaint on file.   (Consider location/radiation/quality/duration/timing/severity/associated sxs/prior treatment) HPI Mandy is a 66 y.o. female who presents today with UTI symptoms for 1 days.  + dysuria + frequency + urgency No hematuria No vaginal discharge No fever/chills + lower abdominal pain No back pain No fatigue    No past medical history on file.  No past surgical history on file.  No family history on file.  History  Substance Use Topics  . Smoking status: Not on file  . Smokeless tobacco: Not on file  . Alcohol Use: Not on file    OB History    No data available      Review of Systems  Allergies  Review of patient's allergies indicates no known allergies.  Home Medications  No current outpatient prescriptions on file.  There were no vitals taken for this visit.  Physical Exam  Nursing note and vitals reviewed. Constitutional: She is oriented to person, place, and time. She appears well-developed and well-nourished.  HENT:  Head: Normocephalic and atraumatic.  Eyes: No scleral icterus.  Neck: Neck supple.  Cardiovascular: Regular rhythm and normal heart sounds.   Pulmonary/Chest: Effort normal and breath sounds normal. No respiratory distress.  Abdominal: Soft. Normal appearance and bowel sounds are normal. She exhibits no mass. There is no rebound, no guarding and no CVA tenderness.  Neurological: She is alert and oriented to person, place, and time.  Skin: Skin is warm and dry.  Psychiatric: She has a normal mood and affect. Her speech is normal.    ED Course  Procedures (including critical care time)  Labs Reviewed - No data to display No results found.   No diagnosis found.    MDM  1) Take the prescribed antibiotic as directed. 2) A urinalysis was done in clinic.  A urine culture is  pending. 3) Follow up with your PCP or urologist if not improving or if worsening symptoms.       Lily Kocher, MD 01/06/11 [phone]

## 2011-01-08 ENCOUNTER — Telehealth: Payer: Self-pay | Admitting: Family Medicine

## 2011-01-10 ENCOUNTER — Telehealth: Payer: Self-pay | Admitting: *Deleted

## 2011-01-26 LAB — LIPID PANEL
Cholesterol: 165 mg/dL (ref 0–200)
HDL: 52 mg/dL (ref 35–70)
LDL Cholesterol: 89 mg/dL
LDl/HDL Ratio: 3.2

## 2011-01-26 LAB — BASIC METABOLIC PANEL
BUN: 15 mg/dL (ref 4–21)
Creatinine: 0.7 mg/dL (ref 0.5–1.1)
Glucose: 85 mg/dL
Potassium: 3.9 mmol/L (ref 3.4–5.3)
Sodium: 141 mmol/L (ref 137–147)

## 2011-01-26 LAB — HEPATIC FUNCTION PANEL
ALT: 21 U/L (ref 7–35)
AST: 18 U/L (ref 13–35)
Alkaline Phosphatase: 60 U/L (ref 25–125)
Bilirubin, Total: 0.5 mg/dL

## 2011-03-15 ENCOUNTER — Encounter: Payer: 59 | Attending: Internal Medicine | Admitting: *Deleted

## 2011-03-15 ENCOUNTER — Encounter: Payer: Self-pay | Admitting: *Deleted

## 2011-03-15 DIAGNOSIS — E669 Obesity, unspecified: Secondary | ICD-10-CM | POA: Insufficient documentation

## 2011-03-15 DIAGNOSIS — E785 Hyperlipidemia, unspecified: Secondary | ICD-10-CM | POA: Insufficient documentation

## 2011-03-15 DIAGNOSIS — I10 Essential (primary) hypertension: Secondary | ICD-10-CM | POA: Insufficient documentation

## 2011-03-15 DIAGNOSIS — R7309 Other abnormal glucose: Secondary | ICD-10-CM | POA: Insufficient documentation

## 2011-03-15 DIAGNOSIS — Z713 Dietary counseling and surveillance: Secondary | ICD-10-CM | POA: Insufficient documentation

## 2011-03-15 NOTE — Progress Notes (Signed)
  Medical Nutrition Therapy:  Appt start time: 0745 end time:  [id number].   Assessment:  Primary concerns today: patient here for nutrition counseling for obesity and elevated fasting BG, which has recently improved. 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 She enjoys her job, however it is very sedentary. She has a 64 year old son at home that has Down's Syndrome and she states he is a joy and very easy to live with. Her husband has been remodeling a second home for a couple of years and she is looking forward to moving there when it is completed. Activity is limited, she has had knee replacement, but is successful with stationary bike in the home about 3 times a week.  MEDICATIONS: see list   DIETARY INTAKE:  Usual eating pattern includes 3 meals and 1-3 snacks per day.  Everyday foods include good variety of all food groups.  Avoided foods include high fat or sugar foods.    24-hr recall:  B ( AM): couple of egg beaters with 1 slice toast, butter, coffee SF creamer & Benefiber OR cottage cheese with fruit and toast (RYE)  Snk ( AM): carb with protein; crax with PNB, Cheese, bagel chip or fruit and protein  L ( PM): brings left overs from home OR Lean Cuisine with raw veggies OR cafe  Snk ( PM): same as AM OR 10 nuts D ( PM): lean meat, small starch, vegetables, salad with oil & vinegar, red wine Snk ( PM): none Beverages: coffee, red wine, hot tea, Seltzer water, 2% milk  Usual physical activity: low, walks dog daily and stationary bike 3x a week for 30 minutes  Estimated energy needs: 1200 calories 135 g carbohydrates 90 g protein 33 g fat  Progress Towards Goal(s):  In progress.   Nutritional Diagnosis:  NI-1.6 Predicted suboptional energy  As related to obesity.  As evidenced by BMI of 39.4% .    Intervention:  Nutrition counseling provided on balance of calorie intake vs energy spent with net weight gain. She has dieted most of her life and  is knowledgeable of good nutritious choices, however her energy expenditure is limited. Encouraged her to consider being active every day just as she eats, sleeps, bathes, etc every day. Plan: Aim for 2 Carb choices (30 grams) per meal +/- 1 either way, 0-1 Choice per snack if hungry Continue to read food labels for total carb of foods Increase activity level by riding stationary bike for at least 10 minutes every day, as tolerated Put "bike" time on the calendar so it is a planned appointment, not a tentative choice Plan a follow up visit to continue conversation on diabetes facts and options for control    Handouts given during visit include:  Living Well with Diabetes booklet FYI  Carb Counting and Reading Food Labels Handout  Meal Plan Card  Monitoring/Evaluation:  Dietary intake, exercise for at least 15 minutes every day as tolerated, body weight prn Patient to schedule once she has her work schedule.

## 2011-03-15 NOTE — Patient Instructions (Signed)
Plan: Aim for 2 Carb choices (30 grams) per meal +/- 1 either way, 0-1 Choice per snack if hungry Continue to read food labels for total carb of foods Increase activity level by riding stationary bike for at least 10 minutes every day, as tolerated Put "bike" time on the calendar so it is a planned appointment, not a tentative choice Plan a follow up visit to continue conversation on diabetes facts and options for control

## 2011-10-23 ENCOUNTER — Other Ambulatory Visit: Payer: Self-pay | Admitting: Obstetrics & Gynecology

## 2011-10-23 DIAGNOSIS — Z1231 Encounter for screening mammogram for malignant neoplasm of breast: Secondary | ICD-10-CM

## 2011-11-29 ENCOUNTER — Ambulatory Visit: Payer: 59

## 2011-12-13 ENCOUNTER — Ambulatory Visit
Admission: RE | Admit: 2011-12-13 | Discharge: 2011-12-13 | Disposition: A | Discharge status: Home / Self Care | Payer: 59 | Source: Ambulatory Visit | Attending: Obstetrics & Gynecology | Admitting: Obstetrics & Gynecology

## 2011-12-13 DIAGNOSIS — Z1231 Encounter for screening mammogram for malignant neoplasm of breast: Secondary | ICD-10-CM

## 2012-01-25 LAB — BASIC METABOLIC PANEL
BUN: 20 mg/dL (ref 4–21)
Creatinine: 0.7 mg/dL (ref 0.5–1.1)
Glucose: 85 mg/dL
Sodium: 140 mmol/L (ref 137–147)

## 2012-01-25 LAB — HEPATIC FUNCTION PANEL
ALT: 20 U/L (ref 7–35)
AST: 21 U/L (ref 13–35)
Alkaline Phosphatase: 64 U/L (ref 25–125)
Bilirubin, Total: 0.5 mg/dL

## 2012-01-25 LAB — LIPID PANEL
Cholesterol: 170 mg/dL (ref 0–200)
HDL: 54 mg/dL (ref 35–70)
LDL Cholesterol: 93 mg/dL
LDl/HDL Ratio: 3.1

## 2012-08-02 DIAGNOSIS — M5416 Radiculopathy, lumbar region: Secondary | ICD-10-CM

## 2012-08-02 HISTORY — DX: Radiculopathy, lumbar region: M54.16

## 2012-08-20 ENCOUNTER — Other Ambulatory Visit: Payer: Self-pay | Admitting: Internal Medicine

## 2012-08-20 ENCOUNTER — Other Ambulatory Visit (HOSPITAL_COMMUNITY): Payer: Self-pay | Admitting: Internal Medicine

## 2012-08-20 DIAGNOSIS — M5416 Radiculopathy, lumbar region: Secondary | ICD-10-CM

## 2012-08-20 DIAGNOSIS — M5410 Radiculopathy, site unspecified: Secondary | ICD-10-CM

## 2012-08-22 ENCOUNTER — Ambulatory Visit (HOSPITAL_COMMUNITY)
Admission: RE | Admit: 2012-08-22 | Discharge: 2012-08-22 | Disposition: A | Discharge status: Home / Self Care | Payer: 59 | Source: Ambulatory Visit | Attending: Internal Medicine | Admitting: Internal Medicine

## 2012-08-22 DIAGNOSIS — M4120 Other idiopathic scoliosis, site unspecified: Secondary | ICD-10-CM | POA: Insufficient documentation

## 2012-08-22 DIAGNOSIS — M5416 Radiculopathy, lumbar region: Secondary | ICD-10-CM

## 2012-08-22 DIAGNOSIS — M47817 Spondylosis without myelopathy or radiculopathy, lumbosacral region: Secondary | ICD-10-CM | POA: Insufficient documentation

## 2012-08-22 DIAGNOSIS — M5126 Other intervertebral disc displacement, lumbar region: Secondary | ICD-10-CM | POA: Insufficient documentation

## 2012-09-05 ENCOUNTER — Other Ambulatory Visit: Payer: Self-pay | Admitting: Internal Medicine

## 2012-09-05 DIAGNOSIS — M5416 Radiculopathy, lumbar region: Secondary | ICD-10-CM

## 2012-09-10 ENCOUNTER — Ambulatory Visit
Admission: RE | Admit: 2012-09-10 | Discharge: 2012-09-10 | Disposition: A | Discharge status: Home / Self Care | Payer: 59 | Source: Ambulatory Visit | Attending: Internal Medicine | Admitting: Internal Medicine

## 2012-09-10 DIAGNOSIS — M5416 Radiculopathy, lumbar region: Secondary | ICD-10-CM

## 2012-09-10 MED ORDER — METHYLPREDNISOLONE ACETATE 40 MG/ML INJ SUSP (RADIOLOG
120.0000 mg | Freq: Once | INTRAMUSCULAR | Status: AC
  Administered 2012-09-10: 120 mg via EPIDURAL

## 2012-09-10 MED ORDER — IOHEXOL 180 MG/ML  SOLN
1.0000 mL | Freq: Once | INTRAMUSCULAR | Status: AC | PRN
  Administered 2012-09-10: 1 mL via EPIDURAL

## 2013-01-02 LAB — HM COLONOSCOPY: HM Colonoscopy: NORMAL

## 2013-03-07 LAB — LIPID PANEL
Cholesterol: 172 mg/dL (ref 0–200)
HDL: 58 mg/dL (ref 35–70)
LDL Cholesterol: 98 mg/dL
LDl/HDL Ratio: 3

## 2013-03-07 LAB — BASIC METABOLIC PANEL
BUN: 14 mg/dL (ref 4–21)
Creatinine: 0.7 mg/dL (ref 0.5–1.1)
Glucose: 87 mg/dL
Potassium: 3.9 mmol/L (ref 3.4–5.3)
Sodium: 141 mmol/L (ref 137–147)

## 2013-03-07 LAB — HEPATIC FUNCTION PANEL
ALT: 27 U/L (ref 7–35)
AST: 23 U/L (ref 13–35)
Alkaline Phosphatase: 76 U/L (ref 25–125)
Bilirubin, Total: 0.6 mg/dL

## 2013-03-21 ENCOUNTER — Other Ambulatory Visit: Payer: Self-pay

## 2013-03-21 DIAGNOSIS — Z1231 Encounter for screening mammogram for malignant neoplasm of breast: Secondary | ICD-10-CM

## 2013-03-21 DIAGNOSIS — Z803 Family history of malignant neoplasm of breast: Secondary | ICD-10-CM

## 2013-04-07 ENCOUNTER — Ambulatory Visit
Admission: RE | Admit: 2013-04-07 | Discharge: 2013-04-07 | Disposition: A | Discharge status: Home / Self Care | Payer: 59 | Source: Ambulatory Visit

## 2013-04-07 DIAGNOSIS — Z1231 Encounter for screening mammogram for malignant neoplasm of breast: Secondary | ICD-10-CM

## 2013-04-07 DIAGNOSIS — Z803 Family history of malignant neoplasm of breast: Secondary | ICD-10-CM

## 2013-06-20 ENCOUNTER — Encounter: Payer: Self-pay | Admitting: Internal Medicine

## 2013-06-20 ENCOUNTER — Other Ambulatory Visit: Payer: Self-pay | Admitting: Internal Medicine

## 2013-06-20 ENCOUNTER — Telehealth: Payer: Self-pay | Admitting: *Deleted

## 2013-06-20 DIAGNOSIS — I4891 Unspecified atrial fibrillation: Secondary | ICD-10-CM

## 2013-06-20 DIAGNOSIS — I480 Paroxysmal atrial fibrillation: Secondary | ICD-10-CM | POA: Insufficient documentation

## 2013-06-20 NOTE — Telephone Encounter (Signed)
Spoke with pt and she would like to proceed with her scheduled echo and ov with [name] next week.  Echo will be for new onset afib.  Schedulers are aware that pt wants to keep scheduled echo and OV appt.  Pt very pleased with all the help.

## 2013-06-24 ENCOUNTER — Ambulatory Visit (HOSPITAL_COMMUNITY): Payer: 59 | Attending: Cardiology | Admitting: Radiology

## 2013-06-24 DIAGNOSIS — I4891 Unspecified atrial fibrillation: Secondary | ICD-10-CM | POA: Insufficient documentation

## 2013-06-24 DIAGNOSIS — E669 Obesity, unspecified: Secondary | ICD-10-CM | POA: Insufficient documentation

## 2013-06-24 NOTE — Progress Notes (Signed)
Echocardiogram performed.  

## 2013-06-25 ENCOUNTER — Ambulatory Visit (INDEPENDENT_AMBULATORY_CARE_PROVIDER_SITE_OTHER): Payer: 59 | Admitting: Internal Medicine

## 2013-06-25 VITALS — BP 136/70 | HR 76 | Ht 68.0 in | Wt 283.0 lb

## 2013-06-25 DIAGNOSIS — R0609 Other forms of dyspnea: Secondary | ICD-10-CM

## 2013-06-25 DIAGNOSIS — R609 Edema, unspecified: Secondary | ICD-10-CM

## 2013-06-25 DIAGNOSIS — R0989 Other specified symptoms and signs involving the circulatory and respiratory systems: Secondary | ICD-10-CM

## 2013-06-25 DIAGNOSIS — I4891 Unspecified atrial fibrillation: Secondary | ICD-10-CM

## 2013-06-25 DIAGNOSIS — R002 Palpitations: Secondary | ICD-10-CM

## 2013-06-25 DIAGNOSIS — R0683 Snoring: Secondary | ICD-10-CM

## 2013-06-25 MED ORDER — FUROSEMIDE 20 MG PO TABS: 20.0000 mg | ORAL_TABLET | ORAL | Status: DC | PRN

## 2013-06-25 NOTE — Patient Instructions (Addendum)
Your physician recommends that you schedule a follow-up appointment in:   [place] physician has recommended you make the following change in your medication:  ASPIRIN  81 MG  EVERY DAY  FUROSEMIDE  20 MG   AS NEEDED  FOR  SWELLING  Your physician has requested that you have a lower or upper extremity venous duplex. This test is an ultrasound of the veins in the legs or arms. It looks at venous blood flow that carries blood from the heart to the legs or arms. Allow one hour for a Lower Venous exam. Allow thirty minutes for an Upper Venous exam. There are no restrictions or special instructions.  LOWER   LEG  EDEMA Your physician has recommended that you have a sleep study. This test records several body functions during sleep, including: brain activity, eye movement, oxygen and carbon dioxide blood levels, heart rate and rhythm, breathing rate and rhythm, the flow of air through your mouth and nose, snoring, body muscle movements, and chest and belly movement.

## 2013-06-25 NOTE — Progress Notes (Signed)
ELECTROPHYSIOLOGY CONSULT NOTE  Patient ID: Veronica Bradford, MRN: [id number], DOB/AGE: [birth year] 68 y.o. Admit date: (Not on file) Date of Consult: 06/25/2013  Primary Physician: Horton Finer, MD Primary Cardiologist: new  Chief Complaint: at4rial fib*   HPI Veronica Bradford is a 68 y.o. 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 Hypertension   . Hyperlipemia   . GERD (gastroesophageal reflux disease)       Surgical History:  Past Surgical History  Procedure Laterality Date  . Total knee arthroplasty    . Abdominal hysterectomy    . Tonsillectomy    . Cholecystectomy       Home Meds: Prior to Admission medications   Medication Sig Start Date End Date Taking? Authorizing Provider  Dexlansoprazole (DEXILANT) 30 MG capsule Take 30 mg by mouth daily.      Historical Provider, MD  esomeprazole (NEXIUM) 40 MG capsule Take 40 mg by mouth daily before breakfast.    Historical Provider, MD  lisinopril-hydrochlorothiazide (PRINZIDE,ZESTORETIC) 10-12.5 MG per tablet Take 1 tablet by mouth daily.      Historical Provider, MD  simvastatin (ZOCOR) 80 MG tablet Take 80 mg by mouth at bedtime.       Historical Provider, MD    Allergies:  Allergies  Allergen Reactions  . Amoxicillin Rash    History   Social History  . Marital Status: Married    Spouse Name: N/A    Number of Children: N/A  . Years of Education: N/A   Occupational History  . Not on file.   Social History Main Topics  . Smoking status: Former Smoker    Quit date: 01/06/1980  . Smokeless tobacco: Never Used  . Alcohol Use: 2.4 oz/week    4 Glasses of wine per week     Comment: glass of wine 4 nights a week  . Drug Use: No  . Sexual Activity: Not on file   Other Topics Concern  . Not on file   Social History Narrative  . No narrative on file     Family History  Problem Relation Age of Onset  . Hypertension Mother   . Cancer Mother     ovarian, colon and breast  . Hypertension Father   . Diabetes Father      ROS:  Please see the history of present illness.     All other systems reviewed and negative.    Physical Exam:   Blood pressure 136/70, pulse 76, height 5\' 8"  (1.727 m), weight 283 lb (128.368 kg). General: Well developed, well nourished female in no acute distress. Head: Normocephalic, atraumatic, sclera non-icteric, no xanthomas,  nares are without discharge. EENT: normal Lymph Nodes:  none Back: without scoliosis/kyphosis , no CVA tendersness Neck: Negative for carotid bruits. JVD 8-10  Lungs: Clear bilaterally to auscultation without wheezes, rales, or rhonchi. Breathing is unlabored. Heart: RRR with diminished S1 S2. 2/6 murmur , rubs, or gallops appreciated. Abdomen: Soft, non-tender, non-distended with normoactive bowel sounds. No hepatomegaly. No rebound/guarding. No obvious abdominal masses. Msk:  Strength and tone appear normal for age. Extremities: No clubbing or cyanosis. L>R 2+edema.  Distal pedal pulses are 2+ and equal bilaterally. Skin: Warm and Dry Neuro: Alert and oriented X 3. CN III-XII intact Grossly normal sensory and motor function . Psych:  Responds to  questions appropriately with a normal affect.      Labs: Cardiac Enzymes No results found for this basename: CKTOTAL, CKMB, TROPONINI,  in the last 72 hours  Radiology/Studies:  No results found.  EKG: sinus at 76  18/13/41  Echo Normal LV functionwith LVH and Mild LAE  Assessment and Plan:  Atrial Fibrillation-presumed  LVH/Hypertensive cardiomyopathy  Left leg edema  Sleep disordered breathing  Hx of Uterine CAncer   She had ausculated irregularly irregular tahcycardia assoc with palpitations.  This occurred in the context of pseudophed, but also has LAE and LVH by echo, poss OSA and Hypertension so afib is likely  Will use AliveCor monitor to track rhythm  This is a very cost effective tool for diagnosing her symptomatic palpitations  If she were to have Afib, she would be an appropriate candidate for anticoagulation and i broached that subject with her today.  We will also have her decrease her ASA dose 325>>81  Will do venous doppler to exclude DVT and with hx of cancer will need pelvic CT if neg  Will arrange sleep study  Increase diuretic to add lasix 20 mg 2-3 d/week as needed for edema  We need to get review of her old labs to incl TSH CBC and BMET      Virl Axe

## 2013-06-27 ENCOUNTER — Ambulatory Visit (HOSPITAL_COMMUNITY): Payer: 59 | Attending: Internal Medicine | Admitting: Cardiology

## 2013-06-27 DIAGNOSIS — R609 Edema, unspecified: Secondary | ICD-10-CM | POA: Insufficient documentation

## 2013-06-27 NOTE — Progress Notes (Signed)
Bilateral LE venous duplex performed. 

## 2013-07-01 ENCOUNTER — Telehealth: Payer: Self-pay | Admitting: Internal Medicine

## 2013-07-01 NOTE — Telephone Encounter (Signed)
New message  Pt called request a call back with results. Please call

## 2013-07-01 NOTE — Telephone Encounter (Signed)
spoke with pt, reviewed results, she expressed understanding but had further questions for klein. will confer with him regarding her questions and get baclk with her.

## 2013-07-03 NOTE — Telephone Encounter (Signed)
Advised patient that I would order pelvic MRI and that we would contact her next week to schedule this. She is agreeable to this.

## 2013-07-07 ENCOUNTER — Other Ambulatory Visit: Payer: Self-pay | Admitting: *Deleted

## 2013-07-07 DIAGNOSIS — Z01812 Encounter for preprocedural laboratory examination: Secondary | ICD-10-CM

## 2013-07-07 DIAGNOSIS — R600 Localized edema: Secondary | ICD-10-CM

## 2013-07-07 NOTE — Telephone Encounter (Signed)
Follow Up   Pt called. Request a call back to discuss if her MRI has been scheduled and if not where is she within the process to have a scheduled appt.

## 2013-07-09 ENCOUNTER — Other Ambulatory Visit (INDEPENDENT_AMBULATORY_CARE_PROVIDER_SITE_OTHER): Payer: 59

## 2013-07-09 DIAGNOSIS — Z01812 Encounter for preprocedural laboratory examination: Secondary | ICD-10-CM

## 2013-07-09 LAB — BASIC METABOLIC PANEL
BUN: 19 mg/dL (ref 6–23)
CALCIUM: 11 mg/dL — AB (ref 8.4–10.5)
CO2: 28 meq/L (ref 19–32)
CREATININE: 0.8 mg/dL (ref 0.4–1.2)
Chloride: 102 mEq/L (ref 96–112)
GFR: 79.82 mL/min (ref >60.00)
Glucose, Bld: 113 mg/dL — ABNORMAL HIGH (ref 70–99)
Potassium: 3.6 mEq/L (ref 3.5–5.1)
Sodium: 139 mEq/L (ref 135–145)

## 2013-07-10 ENCOUNTER — Other Ambulatory Visit: Payer: Self-pay | Admitting: *Deleted

## 2013-07-10 ENCOUNTER — Telehealth: Payer: Self-pay | Admitting: Internal Medicine

## 2013-07-10 NOTE — Progress Notes (Signed)
Error

## 2013-07-10 NOTE — Telephone Encounter (Signed)
Theadora Rama, CMA spoke with patient earlier and explained we will send pill order out on 7/27. Order for Valium 5mg  prior to MRI. Patient verbalized understanding and agreeable to plan.

## 2013-07-10 NOTE — Telephone Encounter (Signed)
New message     Pt is having an MRI on 07-29-13.  She is claustrophobiic and need medication.  Please call in something to [facility] on [address]

## 2013-07-25 ENCOUNTER — Ambulatory Visit (HOSPITAL_COMMUNITY)
Admission: RE | Admit: 2013-07-25 | Discharge: 2013-07-25 | Disposition: A | Discharge status: Home / Self Care | Payer: 59 | Source: Ambulatory Visit | Attending: Internal Medicine | Admitting: Internal Medicine

## 2013-07-28 ENCOUNTER — Other Ambulatory Visit: Payer: Self-pay | Admitting: *Deleted

## 2013-07-28 MED ORDER — DIAZEPAM 5 MG PO TABS: 5.0000 mg | ORAL_TABLET | Freq: Four times a day (QID) | ORAL | Status: DC | PRN

## 2013-07-28 NOTE — Telephone Encounter (Signed)
phoned in 5mg  valium 1 tab only for the MRI on 7.28.15

## 2013-07-28 NOTE — Telephone Encounter (Signed)
also called pt and left msg regarding script. both home & work

## 2013-07-29 ENCOUNTER — Ambulatory Visit (HOSPITAL_COMMUNITY): Admission: RE | Admit: 2013-07-29 | Payer: 59 | Source: Ambulatory Visit

## 2013-07-30 ENCOUNTER — Other Ambulatory Visit (HOSPITAL_COMMUNITY): Payer: 59

## 2013-07-31 ENCOUNTER — Other Ambulatory Visit: Payer: Self-pay | Admitting: *Deleted

## 2013-07-31 ENCOUNTER — Ambulatory Visit (HOSPITAL_COMMUNITY)
Admission: RE | Admit: 2013-07-31 | Discharge: 2013-07-31 | Disposition: A | Discharge status: Home / Self Care | Payer: 59 | Source: Ambulatory Visit | Attending: Internal Medicine | Admitting: Internal Medicine

## 2013-07-31 DIAGNOSIS — R600 Localized edema: Secondary | ICD-10-CM

## 2013-07-31 DIAGNOSIS — Z8542 Personal history of malignant neoplasm of other parts of uterus: Secondary | ICD-10-CM | POA: Insufficient documentation

## 2013-07-31 DIAGNOSIS — R609 Edema, unspecified: Secondary | ICD-10-CM

## 2013-07-31 MED ORDER — GADOBENATE DIMEGLUMINE 529 MG/ML IV SOLN
20.0000 mL | Freq: Once | INTRAVENOUS | Status: AC
Start: 2013-07-31 — End: 2013-07-31
  Administered 2013-07-31: 20 mL via INTRAVENOUS

## 2013-08-02 DIAGNOSIS — M5431 Sciatica, right side: Secondary | ICD-10-CM

## 2013-08-02 HISTORY — DX: Sciatica, right side: M54.31

## 2013-08-04 ENCOUNTER — Telehealth: Payer: Self-pay | Admitting: Internal Medicine

## 2013-08-04 NOTE — Telephone Encounter (Signed)
Follow up:    Pt called for the results of her MRI 7/30 and the Sleep study plesase   Give her a call.

## 2013-08-04 NOTE — Telephone Encounter (Signed)
Advised normal MRI. She is extremely relieved to hear the good news.  Informed her Dr. Caryl Comes has not reviewed sleep study yet, but that I would call her once he has reviewed. She is agreeable to this.

## 2013-08-05 ENCOUNTER — Emergency Department (HOSPITAL_COMMUNITY)
Admission: EM | Admit: 2013-08-05 | Discharge: 2013-08-05 | Disposition: A | Discharge status: Home / Self Care | Payer: 59 | Attending: Emergency Medicine | Admitting: Emergency Medicine

## 2013-08-05 ENCOUNTER — Encounter (HOSPITAL_COMMUNITY): Payer: Self-pay | Admitting: Emergency Medicine

## 2013-08-05 ENCOUNTER — Other Ambulatory Visit: Payer: Self-pay | Admitting: Physician Assistant

## 2013-08-05 ENCOUNTER — Telehealth: Payer: Self-pay | Admitting: Internal Medicine

## 2013-08-05 DIAGNOSIS — Z87891 Personal history of nicotine dependence: Secondary | ICD-10-CM | POA: Insufficient documentation

## 2013-08-05 DIAGNOSIS — I480 Paroxysmal atrial fibrillation: Secondary | ICD-10-CM

## 2013-08-05 DIAGNOSIS — R42 Dizziness and giddiness: Secondary | ICD-10-CM | POA: Insufficient documentation

## 2013-08-05 DIAGNOSIS — Z7982 Long term (current) use of aspirin: Secondary | ICD-10-CM | POA: Insufficient documentation

## 2013-08-05 DIAGNOSIS — K219 Gastro-esophageal reflux disease without esophagitis: Secondary | ICD-10-CM | POA: Insufficient documentation

## 2013-08-05 DIAGNOSIS — R7309 Other abnormal glucose: Secondary | ICD-10-CM

## 2013-08-05 DIAGNOSIS — E739 Lactose intolerance, unspecified: Secondary | ICD-10-CM | POA: Insufficient documentation

## 2013-08-05 DIAGNOSIS — R002 Palpitations: Secondary | ICD-10-CM | POA: Insufficient documentation

## 2013-08-05 DIAGNOSIS — I4891 Unspecified atrial fibrillation: Secondary | ICD-10-CM | POA: Insufficient documentation

## 2013-08-05 DIAGNOSIS — E785 Hyperlipidemia, unspecified: Secondary | ICD-10-CM | POA: Insufficient documentation

## 2013-08-05 DIAGNOSIS — IMO0002 Reserved for concepts with insufficient information to code with codable children: Secondary | ICD-10-CM | POA: Insufficient documentation

## 2013-08-05 DIAGNOSIS — I10 Essential (primary) hypertension: Secondary | ICD-10-CM | POA: Insufficient documentation

## 2013-08-05 DIAGNOSIS — Z79899 Other long term (current) drug therapy: Secondary | ICD-10-CM | POA: Insufficient documentation

## 2013-08-05 DIAGNOSIS — Z792 Long term (current) use of antibiotics: Secondary | ICD-10-CM | POA: Insufficient documentation

## 2013-08-05 DIAGNOSIS — R7302 Impaired glucose tolerance (oral): Secondary | ICD-10-CM

## 2013-08-05 HISTORY — DX: Paroxysmal atrial fibrillation: I48.0

## 2013-08-05 LAB — BASIC METABOLIC PANEL
ANION GAP: 12 (ref 5–15)
BUN: 12 mg/dL (ref 6–23)
CALCIUM: 10.1 mg/dL (ref 8.4–10.5)
CHLORIDE: 106 meq/L (ref 96–112)
CO2: 26 mEq/L (ref 19–32)
Creatinine, Ser: 0.6 mg/dL (ref 0.50–1.10)
GFR calc Af Amer: >90 mL/min (ref >90)
GFR calc non Af Amer: >90 mL/min (ref >90)
GLUCOSE: 126 mg/dL — AB (ref 70–99)
POTASSIUM: 3.6 meq/L — AB (ref 3.7–5.3)
SODIUM: 144 meq/L (ref 137–147)

## 2013-08-05 LAB — PRO B NATRIURETIC PEPTIDE: Pro B Natriuretic peptide (BNP): 445.2 pg/mL — ABNORMAL HIGH (ref 0–125)

## 2013-08-05 LAB — CBC
HCT: 42.5 % (ref 36.0–46.0)
HEMOGLOBIN: 14.4 g/dL (ref 12.0–15.0)
MCH: 28.7 pg (ref 26.0–34.0)
MCHC: 33.9 g/dL (ref 30.0–36.0)
MCV: 84.8 fL (ref 78.0–100.0)
Platelets: 256 10*3/uL (ref 150–400)
RBC: 5.01 MIL/uL (ref 3.87–5.11)
RDW: 15.2 % (ref 11.5–15.5)
WBC: 6.6 10*3/uL (ref 4.0–10.5)

## 2013-08-05 LAB — TSH: TSH: 0.498 u[IU]/mL (ref 0.350–4.500)

## 2013-08-05 LAB — TROPONIN I

## 2013-08-05 LAB — I-STAT TROPONIN, ED: Troponin i, poc: 0 ng/mL (ref 0.00–0.08)

## 2013-08-05 MED ORDER — APIXABAN 5 MG PO TABS: 5.0000 mg | ORAL_TABLET | Freq: Two times a day (BID) | ORAL | Status: DC

## 2013-08-05 MED ORDER — METOPROLOL TARTRATE 25 MG PO TABS
12.5000 mg | ORAL_TABLET | Freq: Two times a day (BID) | ORAL | Status: DC
  Administered 2013-08-05: 12.5 mg via ORAL
  Filled 2013-08-05: qty 1

## 2013-08-05 MED ORDER — METOPROLOL TARTRATE 12.5 MG HALF TABLET: 12.5000 mg | ORAL_TABLET | Freq: Two times a day (BID) | ORAL | Status: DC

## 2013-08-05 MED ORDER — DILTIAZEM HCL 100 MG IV SOLR
5.0000 mg/h | INTRAVENOUS | Status: DC
  Administered 2013-08-05: 5 mg/h via INTRAVENOUS

## 2013-08-05 MED ORDER — DILTIAZEM LOAD VIA INFUSION
25.0000 mg | Freq: Once | INTRAVENOUS | Status: AC
  Administered 2013-08-05: 25 mg via INTRAVENOUS
  Filled 2013-08-05: qty 25

## 2013-08-05 MED ORDER — APIXABAN 5 MG PO TABS
5.0000 mg | ORAL_TABLET | Freq: Two times a day (BID) | ORAL | Status: DC
  Administered 2013-08-05: 5 mg via ORAL
  Filled 2013-08-05 (×2): qty 1

## 2013-08-05 MED ORDER — SODIUM CHLORIDE 0.9 % IV BOLUS (SEPSIS): 500.0000 mL | Freq: Once | INTRAVENOUS | Status: DC

## 2013-08-05 NOTE — Consult Note (Signed)
CARDIOLOGY CONSULT NOTE   Patient ID: Veronica Bradford MRN: [id number], DOB/AGE: 1945-08-04   Admit date: 08/05/2013 Date of Consult: 08/05/2013   Primary Physician: Horton Finer, MD Primary Cardiologist: Dr. Virl Axe  Pt. Profile   obese 68 year old Caucasian female with past medical history of hypertension, hyperlipidemia, GERD, and borderline diabetes present with a-fib with RVR who spontaneously converted back to NSR after diltiazem bolus  Problem List  Past Medical History  Diagnosis Date  . Hypertension   . Hyperlipemia   . GERD (gastroesophageal reflux disease)     Past Surgical History  Procedure Laterality Date  . Total knee arthroplasty    . Abdominal hysterectomy    . Tonsillectomy    . Cholecystectomy       Allergies  Allergies  Allergen Reactions  . Amoxicillin Rash    HPI   The patient is obese 68 year old Caucasian female with past medical history of hypertension, hyperlipidemia, GERD, chronic diastolic dysfunction and borderline diabetes. She was recently seen by Dr. Virl Axe in the office on 06/25/2013 after developing irregular heart rate for 10 hours. Unfortunately, her symptom resolved prior to office appointment. It was presumed that she had paroxysmal atrial fibrillation. She also recently underwent a colonoscopy as part of her annual screening process. The result was negative, however patient had prolonged diarrhea afterward. She denies any recent fever or chill, however states she was not feeling well throughout the entire Saturday and has some nausea and vomiting at the time. As part of the workup for her lower extremity edema, Dr. Caryl Comes scheduled MRI of the pelvis given her previous history of cancer which was negative. She also had lower extremity venous Doppler which was negative for DVT as well. She underwent echocardiogram on 06/24/2013 which showed moderate LVH, preserved systolic function, grade 2 diastolic dysfunction, mildly  dilated left atrium. 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 She woke up in the morning of 08/05/2013 around 5:30 AM feeling some palpitations and flushing sensation in her face. She checked her pulse which was tachycardic at the time and believed she went back into atrial fibrillation again.  Op on presentation to [facility] ED, she was noted to have a blood pressure of 120/58. Her pulse was take to the cardiac in 100-120s. Initial EKG showed atrial fibrillation with RVR with heart rate 114. Point-of-care troponin was negative. Significant laboratory finding include potassium of 3.6, creatinine of 0.6, proBNP of 400, glucose of 126. Patient was placed on diltiazem bolus followed by drip. Cardiology was consulted for A. fib with RVR.  Of note, patient denies any recent exertional chest discomfort, she does have lower extremity edema presumed to the ED to venous insufficiency, however denies any orthopnea or paroxysmal nocturnal dyspnea. She had some mild lightheadedness during the event, however no significant shortness of breath. Prior to be seen by cardiology service, patient actually converted back to normal sinus rhythm. Telemetry did not reveal significant posttermination pulses, the longest pulse was 1.2 seconds. After conversion, patient's vital sign was initially hypotensive with systolic blood pressure in the 90s, however gradually went back to 120s. Patient symptom resolved after conversion.  Inpatient Medications  . apixaban  5 mg Oral BID  . metoprolol tartrate  12.5 mg Oral BID    Family History Family History  Problem Relation Age of Onset  . Hypertension Mother   .  Cancer Mother     ovarian, colon and breast  . Hypertension Father   . Diabetes Father      Social History History   Social History  . Marital Status: Married    Spouse Name: N/A    Number of  Children: N/A  . Years of Education: N/A   Occupational History  . Not on file.   Social History Main Topics  . Smoking status: Former Smoker    Quit date: 01/06/1980  . Smokeless tobacco: Never Used  . Alcohol Use: 2.4 oz/week    4 Glasses of wine per week     Comment: glass of wine 4 nights a week  . Drug Use: No  . Sexual Activity: Not on file   Other Topics Concern  . Not on file   Social History Narrative  . No narrative on file     Review of Systems  General:  No chills, fever, night sweats. Lost 15 lbs weight changes.  Cardiovascular:  No chest pain, orthopnea, paroxysmal nocturnal dyspnea. Mild dyspnea on exertion, no increase in LE edema.  Palpitations and facial flushing Dermatological: No rash, lesions/masses Respiratory: No cough, dyspnea Urologic: No hematuria, dysuria Abdominal:   No nausea, vomiting, diarrhea, bright red blood per rectum, melena, or hematemesis Neurologic:  No visual changes, wkns, changes in mental status. All other systems reviewed and are otherwise negative except as noted above.  Physical Exam  Blood pressure 120/58, pulse 100, temperature 98 F (36.7 C), temperature source Oral, resp. rate 13, SpO2 98.00%.  General: Pleasant, NAD Psych: Normal affect. Neuro: Alert and oriented X 3. Moves all extremities spontaneously. HEENT: Normal  Neck: Supple without bruits or JVD. Lungs:  Resp regular and unlabored, CTA. Heart: RRR no s3, s4, or murmurs. Abdomen: Soft, non-tender, non-distended, BS + x 4.  Extremities: No clubbing, cyanosis. DP/PT/Radials 2+ and equal bilaterally. 2+ nonpitting edema  Labs  No results found for this basename: CKTOTAL, CKMB, TROPONINI,  in the last 72 hours Lab Results  Component Value Date   WBC 6.6 08/05/2013   HGB 14.4 08/05/2013   HCT 42.5 08/05/2013   MCV 84.8 08/05/2013   PLT 256 08/05/2013    Recent Labs Lab 08/05/13 1012  NA 144  K 3.6*  CL 106  CO2 26  BUN 12  CREATININE 0.60  CALCIUM 10.1    GLUCOSE 126*   No results found for this basename: CHOL, HDL, LDLCALC, TRIG   No results found for this basename: DDIMER    Radiology/Studies  Mr Pelvis W Wo Contrast  07/31/2013   CLINICAL DATA:  HISTORY OF ENDOMETRIAL CANCER.  EXAM: MRI PELVIS WITHOUT AND WITH CONTRAST  TECHNIQUE: Multiplanar multisequence MR imaging of the pelvis was performed both before and after administration of intravenous contrast.  CONTRAST:  16mL MULTIHANCE GADOBENATE DIMEGLUMINE 529 MG/ML IV SOLN  COMPARISON:  CT dated 06/18/2007  FINDINGS: There is no free fluid identified within the pelvis. No fluid collection. There is no evidence for adenopathy within the lower abdomen or pelvis. Small bilateral iliac nodes appear similar to previous examination.  Postoperative change from hysterectomy identified. No evidence for abnormal enhancing soft tissue within the vaginal cuff to suggest local tumor recurrence. The urinary bladder appears normal. The pelvic bowel loops are unremarkable. No peritoneal nodule or mass identified. The abdominal aorta and IVC as well as the iliac vessels appear patent. No findings identified to explain patient's leg swelling.  The signal from within the bone marrow appears normal. Note  is made of a fat containing right inguinal hernia.  IMPRESSION: 1. Examination is negative for residual or recurrence of tumor. 2. No findings identified to explain leg swelling. 3. Previous hysterectomy. 4. Right inguinal hernia which contains fat only.   Electronically Signed   By: Kerby Moors M.D.   On: 07/31/2013 13:05    ECG  EKG atrial fibrillation with RVR with heart rate 114  ASSESSMENT AND PLAN  1. atrial fibrillation with RVR  - converted in the ED after dilt gtt initiation  - will need rate control med, have given pt prescription for metoprolol 12.5 mg BID, start first dose in ED  - discussed with patient regarding anticoagulation, CHA2DS2-Vasc score 3 (HTN, female, age)  - eliquis 5mg  BID,  check with case manager (given pt 30day free coupon, copay afterward is $10 per month on eliquis)  - outpatient Lexiscan stress test in 1-2 weeks, office will contact patient  - check another troponin along with TSH, if troponin negative, patient to be discharged to followup with Dr. Caryl Comes as outpatient   2. lower extremity edema, likely 2.2 venous insufficiency and chronic diastolic HF 3. chronic diastolic dysfunction, on 20mg  lasix every other day  4. hypertension 5. Hyperlipidemia 6. Borderline hyperglycemia   Signed, Almyra Deforest, PA-C 08/05/2013, 1:39 PM   I have seen and evaluated the patient this PM along with Almyra Deforest, PA-C. I agree with his findings, examination as well as impression recommendations. I was present during the interview & examined the patient along with Mr. Eulas Post.  We have discussed the plan as laid out above.  New Dx of Afib in a 68 y/o woman with multiple CRFs - obese, borderline DM (glucose intolerant), HTN & HLD  -- converted with Diltiazem.  Will use BB (to avoid CCB related edema) & initiate Anticoagulation with Eliquis (CHA2DS2Vasc score ~3).   OP ST - if normal, could consider Flecainide for PRN or standing control -- defer to Dr. Caryl Comes.  (no SSx of CHF despite mod LVH with Gr 2 DD on Echo).  MD Time with pt: 25 min - total PA &MD time ~45 min.  Leonie Man, M.D., M.S. Interventional Cardiologist   Pager # [phone] 08/05/2013

## 2013-08-05 NOTE — ED Notes (Signed)
Pt.stated, i got up at 0530 feeling really hot so i got up to read and felt my heart going fast. I had the same symptoms one month ago. I called Dr. Sammuel Bailiff office and was told to come here.

## 2013-08-05 NOTE — Discharge Instructions (Signed)
You were started on two new medications. You are to take metoprolol 12.5 mg twice a day and apixaban 5 mg twice a day. You will have a Lexiscan stress test on 8/17 and a cardiology follow-up appointment with Kerin Ransom on 8/26. Please seek medical attention or return to the ED if you have any new chest pain, palpitations, shortness of breath, or any other worrisome medical condition.

## 2013-08-05 NOTE — Telephone Encounter (Signed)
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 Please call and advise.

## 2013-08-05 NOTE — ED Notes (Signed)
Vital signs stable. 

## 2013-08-05 NOTE — ED Provider Notes (Signed)
I saw and evaluated the patient, reviewed the resident's note and I agree with the findings and plan.   EKG Interpretation   Date/Time:  Tuesday August 05 2013 09:56:27 EDT Ventricular Rate:  114 PR Interval:    QRS Duration: 126 QT Interval:  344 QTC Calculation: 474 R Axis:   -24 Text Interpretation:  Atrial fibrillation with rapid ventricular response  Left ventricular hypertrophy with QRS widening Cannot rule out Septal  infarct , age undetermined T wave abnormality, consider lateral ischemia  or digitalis effect Abnormal ECG compared to prior, a fib has replaced  sinus rhythm and rate increased  Confirmed by [facility]  MD, TREY ([id number]) on  08/05/2013 1:20:24 PM      68 yo female with palpitations presenting with A fib with RVR.  On exam, well appearing, nontoxic, not distressed, normal respiratory effort, normal perfusion, heart sounds normal with irregularly irregular rhythm and tachy rate, lungs CTAB.  Dilt bolus and drip has improved rate.  Cardiology consulted.    Clinical Impression: 1. Paroxysmal atrial fibrillation   2. Hypertension, essential   3. Morbidly obese   4. Glucose intolerance (impaired glucose tolerance)       Houston Siren III, MD 08/06/13 [phone]

## 2013-08-05 NOTE — Progress Notes (Signed)
  CARE MANAGEMENT ED NOTE 08/05/2013  Patient:  COHEN, DOLEMAN   Account Number:  [id number]  Date Initiated:  08/05/2013  Documentation initiated by:  Edwyna Shell  Subjective/Objective Assessment:   68 yo female presenting to the ED with palpatations and dizziness     Subjective/Objective Assessment Detail:     Action/Plan:   Patient will use 30 day voucher for Eliquis at the outpatient pharmacy and then will follow up with the continued coupon through the outpatient pharmacy   Action/Plan Detail:   Anticipated DC Date:       Status Recommendation to Physician:   Result of Recommendation:  Agreed    DC Planning Services  CM consult  Medication Assistance    Choice offered to / List presented to:  C-1 Patient          Status of service:  Completed, signed off  ED Comments:   ED Comments Detail:  CM consulted by cardiology to assist with Eliquis 30 day voucher and continued coupon for refills. This CM spoke with Gerald Stabs in outpatient pharmacy and he stated that after the first 30 days the refills will be $25 without the coupon and $10 with the Eliquis coupon. This CM then activated the 30 day voucher and instructed the patient to take the coupon with the prescription to the outpatient pharmacy and also explained that the outpatient pharmacy has the coupon for the refills which will then lower the cost to $10 per month. Cardiology PA Almyra Deforest was called back and informed of affordability of medication and 30 day voucher that would be provided to the patient. The patient verbalized understanding and had no other questions or concerns.

## 2013-08-05 NOTE — ED Provider Notes (Signed)
CSN: [id number]     Arrival date & time 08/05/13  3299 History   First MD Initiated Contact with Patient 08/05/13 1019     Chief Complaint  Patient presents with  . Palpitations  . Dizziness     (Consider location/radiation/quality/duration/timing/severity/associated sxs/prior Treatment) Patient is a 68 y.o. female presenting with palpitations and dizziness.  Palpitations Associated symptoms: dizziness, nausea, shortness of breath and vomiting   Associated symptoms: no chest pain and no diaphoresis   Dizziness Associated symptoms: diarrhea, nausea, palpitations, shortness of breath and vomiting   Associated symptoms: no chest pain     Ms. Schwarz is a 68 year old woman with HTN, HL who presents with new onset a fib w RVR. Last June she felt palpitations and found her own pulse irregularly irregular (she is an Therapist, sports). She saw her cardiologist Dr. Caryl Comes and was in NSR. She had an echo notable for grade 2 diastolic dysfunction w a mildly dilated L atrium. LE doppler negative. She was fine until this past week she had several episodes of diarrhea and one episode of emesis on Saturday. She also reports subjective fevers and thought she had a bug. This morning she woke up with palpitations and an irregularly irregular rhythm in the 140s. She called her cardiologist and came in to the ED w husband.   Past Medical History  Diagnosis Date  . Hypertension   . Hyperlipemia   . GERD (gastroesophageal reflux disease)    Past Surgical History  Procedure Laterality Date  . Total knee arthroplasty    . Abdominal hysterectomy    . Tonsillectomy    . Cholecystectomy     Family History  Problem Relation Age of Onset  . Hypertension Mother   . Cancer Mother     ovarian, colon and breast  . Hypertension Father   . Diabetes Father    History  Substance Use Topics  . Smoking status: Former Smoker    Quit date: 01/06/1980  . Smokeless tobacco: Never Used  . Alcohol Use: 2.4 oz/week    4 Glasses of  wine per week     Comment: glass of wine 4 nights a week   OB History   Grav Para Term Preterm Abortions TAB SAB Ect Mult Living                 Review of Systems  Constitutional: Positive for fever and appetite change. Negative for chills and diaphoresis.  Respiratory: Positive for shortness of breath.   Cardiovascular: Positive for palpitations. Negative for chest pain.  Gastrointestinal: Positive for nausea, vomiting and diarrhea. Negative for abdominal pain and constipation.  Neurological: Positive for dizziness.      Allergies  Amoxicillin  Home Medications   Prior to Admission medications   Medication Sig Start Date End Date Taking? Authorizing Provider  acetaminophen-codeine (TYLENOL #3) 300-30 MG per tablet Take 1 tablet by mouth every 4 (four) hours as needed for moderate pain.    Yes Historical Provider, MD  aspirin EC 81 MG tablet Take 81 mg by mouth at bedtime.   Yes Historical Provider, MD  cetirizine (ZYRTEC) 10 MG tablet Take 10 mg by mouth daily.   Yes Historical Provider, MD  cholecalciferol (VITAMIN D) 1000 UNITS tablet Take 2,000 Units by mouth every other day.   Yes Historical Provider, MD  Coenzyme Q10 (CO Q 10) 10 MG CAPS Take 20 mg by mouth daily.   Yes Historical Provider, MD  esomeprazole (NEXIUM) 40 MG capsule Take 40  mg by mouth daily before breakfast.   Yes Historical Provider, MD  fluticasone (FLONASE) 50 MCG/ACT nasal spray Place 2 sprays into both nostrils daily.   Yes Historical Provider, MD  furosemide (LASIX) 20 MG tablet Take 20 mg by mouth daily as needed for fluid.   Yes Historical Provider, MD  Multiple Vitamins-Minerals (MULTIVITAMIN PO) Take 1 tablet by mouth daily.   Yes Historical Provider, MD  Omega-3 Fatty Acids (FISH OIL) 1200 MG CAPS Take 1,200 mg by mouth daily.   Yes Historical Provider, MD  oxymetazoline (AFRIN) 0.05 % nasal spray Place 2 sprays into both nostrils daily.   Yes Historical Provider, MD  simvastatin (ZOCOR) 80 MG  tablet Take 80 mg by mouth at bedtime.     Yes Historical Provider, MD  valsartan-hydrochlorothiazide (DIOVAN-HCT) 320-25 MG per tablet Take 1 tablet by mouth daily.   Yes Historical Provider, MD   BP 137/88  Pulse 95  Temp(Src) 98 F (36.7 C) (Oral)  Resp 20  SpO2 97% Physical Exam  Constitutional: She appears well-developed and well-nourished. No distress.  HENT:  Head: Normocephalic and atraumatic.  Eyes: EOM are normal. Pupils are equal, round, and reactive to light. No scleral icterus.  Cardiovascular:  Irregularly irregular, tachycardic, 2/6 systolic ejection murmur  Pulmonary/Chest: Effort normal and breath sounds normal. No respiratory distress. She has no wheezes. She has no rales.  Abdominal: Soft. Bowel sounds are normal. She exhibits no distension. There is no tenderness.  Musculoskeletal: She exhibits no edema.  Skin: She is not diaphoretic.    ED Course  Procedures (including critical care time) Labs Review  Basic Metabolic Panel:  Recent Labs  08/05/13 1012  NA 144  K 3.6*  CL 106  CO2 26  GLUCOSE 126*  BUN 12  CREATININE 0.60  CALCIUM 10.1   CBC:  Recent Labs  08/05/13 1012  WBC 6.6  HGB 14.4  HCT 42.5  MCV 84.8  PLT 256   Cardiac Enzymes:  Recent Labs  08/05/13 1435  TROPONINI <0.30  troponin negative x 2  BNP:  Recent Labs  08/05/13 1012  PROBNP 445.2*   Thyroid Function Tests:  Recent Labs  08/05/13 1426  TSH 0.498     Imaging Review No results found.   EKG Interpretation None      MDM   Final diagnoses:  None   10:56 AM: Patient in a fib w RVR on EKG. Irregularly irregular and tachycardic on exam, otherwise exam wnl. Called cardiology. Will start diltiazem with 25 mg bolus and titrated drip and reassess.  11:35 AM: Repeat EKG after diltiazem bolus still a fib but HR 79. Patient still asymptomatic on diltiazem drip. Troponin negative. CBC, BMP wnl  1:59 PM: Spoke to cardiology. They started her on  metoprolol 12.5 mg BID, apaxiban 5 mg BID. If her second troponin is negative, she is clear for d/c. They will schedule her outpatient follow-up which will likely include stress test. Pt currently stable with HR in 60s.  3:38PM: Second troponin negative. Patient asymptomatic and hemodynamically stable. Will d/c home.  Kelby Aline, MD 08/05/13 1540

## 2013-08-06 NOTE — ED Provider Notes (Signed)
I saw and evaluated the patient, reviewed the resident's note and I agree with the findings and plan.   EKG Interpretation   Date/Time:  Tuesday August 05 2013 09:56:27 EDT Ventricular Rate:  114 PR Interval:    QRS Duration: 126 QT Interval:  344 QTC Calculation: 474 R Axis:   -24 Text Interpretation:  Atrial fibrillation with rapid ventricular response  Left ventricular hypertrophy with QRS widening Cannot rule out Septal  infarct , age undetermined T wave abnormality, consider lateral ischemia  or digitalis effect Abnormal ECG compared to prior, a fib has replaced  sinus rhythm and rate increased  Confirmed by [facility]  MD, TREY ([id number]) on  08/05/2013 1:20:24 PM        Houston Siren III, MD 08/06/13 [phone]

## 2013-08-07 ENCOUNTER — Other Ambulatory Visit (HOSPITAL_COMMUNITY): Payer: 59

## 2013-08-07 NOTE — Telephone Encounter (Signed)
Abnormal sleep study results.  Dr. Caryl Comes is referring to her PCP, Dr. Maxwell Caul, for further advisement/referral needs. Faxing copy of sleep study to his office today.  Patient verbalized understanding and agreeable to plan.

## 2013-08-14 ENCOUNTER — Encounter: Payer: Self-pay | Admitting: Internal Medicine

## 2013-08-18 ENCOUNTER — Encounter (HOSPITAL_COMMUNITY): Payer: 59

## 2013-08-27 ENCOUNTER — Encounter: Payer: 59 | Admitting: Cardiology

## 2013-09-03 ENCOUNTER — Encounter (HOSPITAL_COMMUNITY): Payer: 59 | Attending: Physician Assistant | Admitting: Radiology

## 2013-09-03 VITALS — BP 122/50 | HR 58 | Ht 68.0 in | Wt 268.0 lb

## 2013-09-03 DIAGNOSIS — I480 Paroxysmal atrial fibrillation: Secondary | ICD-10-CM

## 2013-09-03 DIAGNOSIS — I4891 Unspecified atrial fibrillation: Secondary | ICD-10-CM | POA: Insufficient documentation

## 2013-09-03 MED ORDER — REGADENOSON 0.4 MG/5ML IV SOLN
0.4000 mg | Freq: Once | INTRAVENOUS | Status: AC
  Administered 2013-09-03: 0.4 mg via INTRAVENOUS

## 2013-09-03 MED ORDER — TECHNETIUM TC 99M SESTAMIBI GENERIC - CARDIOLITE
33.0000 | Freq: Once | INTRAVENOUS | Status: AC | PRN
  Administered 2013-09-03: 33 via INTRAVENOUS

## 2013-09-03 NOTE — Progress Notes (Signed)
[facility] [address] [place], [place] [address] [phone]    Cardiology Nuclear Med Study  JOSCELIN FRAY is a 68 y.o. female     MRN : [id number]     DOB: 04/15/1945  Procedure Date: 09/03/2013  Nuclear Med Background Indication for Stress Test:  Evaluation for Ischemia, and Patient seen in hospital on 08-05-2013 for AFIB/RVR, negative Troponin History:  No known CAD, Afib, Echo 06/2013, MPI ~10 yrs ago Cardiac Risk Factors: History of Smoking, Hypertension and Lipids  Symptoms:  Palpitations   Nuclear Pre-Procedure Caffeine/Decaff Intake:  None> 12 hrs NPO After: 9:00pm   Lungs:  clear O2 Sat: 98% on room air. IV 0.9% NS with Angio Cath:  22g  IV Site: R Antecubital x 1, tolerated well IV Started by:  Irven Baltimore, RN  Chest Size (in):  44 Cup Size: C  Height: 5\' 8"  (1.727 m)  Weight:  268 lb (121.564 kg)  BMI:  Body mass index is 40.76 kg/(m^2). Tech Comments:  No medications today. Patient took Metoprolol 8:00pm yesterday. Patsy Edwards,RN.    Nuclear Med Study 1 or 2 day study: 2 day  Stress Test Type:  Treadmill/Lexiscan  Reading MD: N/A  Order Authorizing Provider:  Virl Axe, MD  Resting Radionuclide: Technetium 36m Sestamibi  Resting Radionuclide Dose: 33.0 mCi  On     09-04-13  Stress Radionuclide:  Technetium 43m Sestamibi  Stress Radionuclide Dose: 33.0 mCi  On        09-03-13          Stress Protocol Rest HR: 58 Stress HR: 103  Rest BP: 122/50 Stress BP: 156/65  Exercise Time (min): n/a METS: n/a           Dose of Adenosine (mg):  n/a Dose of Lexiscan: 0.4 mg  Dose of Atropine (mg): n/a Dose of Dobutamine: n/a mcg/kg/min (at max HR)  Stress Test Technologist: Glade Lloyd, BS-ES  Nuclear Technologist:  Vedia Pereyra, CNMT     Rest Procedure:  Myocardial perfusion imaging was performed at rest 45 minutes following the intravenous administration of Technetium 27m Sestamibi. Rest ECG: Sinus bradycardia heart rate 58  with nonspecific interventricular conduction delay  Stress Procedure:  The patient received IV Lexiscan 0.4 mg over 15-seconds with concurrent low level exercise and then Technetium 62m Sestamibi was injected at 30-seconds while the patient continued walking one more minute.  Quantitative spect images were obtained after a 45-minute delay.  During the infusion of Lexiscan the patient stated she felt flushed but this resolved in recovery.  Stress ECG: No significant change from baseline ECG  QPS Raw Data Images:  Mild diaphragmatic attenuation; normal left ventricular size. There is bowel loop attenuation artifact seen at rest and stress, more significantly at rest. Stress Images:  There is subtle decreased uptake along the anterior wall distribution seen at both rest and stress consistent with breast attenuation. Otherwise, there is homogeneous radiotracer uptake. Rest Images:  See above Subtraction (SDS):  No evidence of ischemia. Transient Ischemic Dilatation (Normal <1.22):  0.89 Lung/Heart Ratio (Normal <0.45):  0.27  Quantitative Gated Spect Images QGS EDV:  94 ml QGS ESV:  31 ml  Impression Exercise Capacity:  Lexiscan with low level exercise. BP Response:  Normal blood pressure response. Clinical Symptoms:  Typical symptoms with Lexiscan. ECG Impression:  No significant ST segment change suggestive of ischemia. Comparison with Prior Nuclear Study: No images to compare  Overall Impression:  Low risk stress nuclear study  with no ischemia identified..  LV Ejection Fraction: 67%  LV Wall Motion:  NL LV Function; NL Wall Motion  Candee Furbish, MD

## 2013-09-04 ENCOUNTER — Ambulatory Visit (HOSPITAL_COMMUNITY): Payer: 59 | Attending: Internal Medicine

## 2013-09-04 DIAGNOSIS — R0989 Other specified symptoms and signs involving the circulatory and respiratory systems: Secondary | ICD-10-CM

## 2013-09-04 MED ORDER — TECHNETIUM TC 99M SESTAMIBI GENERIC - CARDIOLITE
33.0000 | Freq: Once | INTRAVENOUS | Status: AC | PRN
  Administered 2013-09-04: 33 via INTRAVENOUS

## 2013-09-09 ENCOUNTER — Ambulatory Visit (INDEPENDENT_AMBULATORY_CARE_PROVIDER_SITE_OTHER): Payer: 59 | Admitting: Cardiology

## 2013-09-09 ENCOUNTER — Other Ambulatory Visit: Payer: Self-pay | Admitting: Internal Medicine

## 2013-09-09 ENCOUNTER — Encounter: Payer: Self-pay | Admitting: Cardiology

## 2013-09-09 VITALS — BP 124/70 | HR 74 | Ht 69.0 in | Wt 267.0 lb

## 2013-09-09 DIAGNOSIS — R600 Localized edema: Secondary | ICD-10-CM

## 2013-09-09 DIAGNOSIS — E785 Hyperlipidemia, unspecified: Secondary | ICD-10-CM | POA: Insufficient documentation

## 2013-09-09 DIAGNOSIS — G4733 Obstructive sleep apnea (adult) (pediatric): Secondary | ICD-10-CM | POA: Insufficient documentation

## 2013-09-09 DIAGNOSIS — I10 Essential (primary) hypertension: Secondary | ICD-10-CM | POA: Insufficient documentation

## 2013-09-09 DIAGNOSIS — Z7901 Long term (current) use of anticoagulants: Secondary | ICD-10-CM

## 2013-09-09 DIAGNOSIS — M8580 Other specified disorders of bone density and structure, unspecified site: Secondary | ICD-10-CM

## 2013-09-09 DIAGNOSIS — I519 Heart disease, unspecified: Secondary | ICD-10-CM

## 2013-09-09 DIAGNOSIS — I4891 Unspecified atrial fibrillation: Secondary | ICD-10-CM

## 2013-09-09 DIAGNOSIS — R609 Edema, unspecified: Secondary | ICD-10-CM

## 2013-09-09 DIAGNOSIS — I480 Paroxysmal atrial fibrillation: Secondary | ICD-10-CM

## 2013-09-09 DIAGNOSIS — E669 Obesity, unspecified: Secondary | ICD-10-CM

## 2013-09-09 DIAGNOSIS — I119 Hypertensive heart disease without heart failure: Secondary | ICD-10-CM

## 2013-09-09 DIAGNOSIS — I5189 Other ill-defined heart diseases: Secondary | ICD-10-CM

## 2013-09-09 DIAGNOSIS — G4730 Sleep apnea, unspecified: Secondary | ICD-10-CM

## 2013-09-09 HISTORY — DX: Sleep apnea, unspecified: G47.30

## 2013-09-09 NOTE — Patient Instructions (Signed)
Your physician recommends that you continue on your current medications as directed. Please refer to the Current Medication list given to you today.   KEEP APPT WITH [name]  09/24/13

## 2013-09-09 NOTE — Assessment & Plan Note (Signed)
No recurrence since ER visit 08/05/13

## 2013-09-09 NOTE — Assessment & Plan Note (Signed)
Lasix prn, recent MRI, venous dopplers negative for DVT

## 2013-09-09 NOTE — Assessment & Plan Note (Signed)
Controlled.  

## 2013-09-09 NOTE — Assessment & Plan Note (Signed)
She is now on Bi Pap

## 2013-09-09 NOTE — Progress Notes (Signed)
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 She was seen June 24th by [name] for suspect PAF thatspontaneously converted back to NSR. She had lower extremity edema and a history suggesting sleep apnea. She had a sleep study as well as work up for pelvic and LE DVT which was negative. Echo showed LVH with grade 2 diastolic dysfunction.          She was seen in the ER 08/05/13 with PAF that converted with Diltiazem. She was started on Eliquis, CHA2D2Vasc -3, and lopressor (no Ca++ blocker secondary to edema. She is in the office today for follow up.             She has not had recurrent palpitations. She is now on BiPap and shared her monitoring results with me. She has lost 23 lbs with [employer]     Current Outpatient Prescriptions  Medication Sig Dispense Refill  . acetaminophen-codeine (TYLENOL #3) 300-30 MG per tablet Take 1 tablet by mouth every 4 (four) hours as needed for moderate pain.       Marland Kitchen apixaban (ELIQUIS) 5 MG TABS tablet Take 1 tablet (5 mg total) by mouth 2 (two) times daily.  60 tablet  0  . calcium & magnesium carbonates (MYLANTA) 311-232 MG per tablet Take 1 tablet by mouth daily.      . cetirizine (ZYRTEC) 10 MG tablet Take 10 mg by mouth daily.      . cholecalciferol (VITAMIN D) 1000 UNITS tablet Take 2,000 Units by mouth every other day.      . Coenzyme Q10 (CO Q 10) 10 MG CAPS Take 20 mg by mouth daily.      Marland Kitchen esomeprazole (NEXIUM) 40 MG capsule Take 40 mg by mouth daily before breakfast.      . fluticasone (FLONASE) 50 MCG/ACT nasal spray Place 2 sprays into both nostrils daily.      . furosemide (LASIX) 20 MG tablet Take 20 mg by mouth daily as needed for fluid.      . metoprolol tartrate (LOPRESSOR) 12.5 mg TABS tablet Take 0.5 tablets  (12.5 mg total) by mouth 2 (two) times daily. Take addition 0.5 tabs if have palpitation  60 tablet  12  . Multiple Vitamins-Minerals (MULTIVITAMIN PO) Take 1 tablet by mouth daily.      . Omega-3 Fatty Acids (FISH OIL) 1200 MG CAPS Take 1,200 mg by mouth daily.      . simvastatin (ZOCOR) 80 MG tablet Take 80 mg by mouth at bedtime.        . valsartan-hydrochlorothiazide (DIOVAN-HCT) 320-25 MG per tablet Take 1 tablet by mouth daily.      . Vitamin D, Ergocalciferol, (DRISDOL) 50000 UNITS CAPS capsule Take 50,000 Units by mouth daily.       No current facility-administered medications for this visit.    Allergies  Allergen Reactions  . Other Other (See Comments)    Shingles Vaccine>Serum Sickness per patient.  . Amoxicillin Rash    History   Social History  . Marital Status: Married    Spouse Name: N/A    Number of Children: N/A  . Years of Education: N/A   Occupational History  . Not on file.   Social History Main Topics  . Smoking status: Former Smoker  Quit date: 01/06/1980  . Smokeless tobacco: Never Used  . Alcohol Use: 2.4 oz/week    4 Glasses of wine per week     Comment: glass of wine 4 nights a week  . Drug Use: No  . Sexual Activity: Not on file   Other Topics Concern  . Not on file   Social History Narrative  . No narrative on file     Review of Systems: General: negative for chills, fever, night sweats or weight changes.  Cardiovascular: negative for chest pain, dyspnea on exertion, edema, orthopnea, palpitations, paroxysmal nocturnal dyspnea or shortness of breath Dermatological: negative for rash Respiratory: negative for cough or wheezing Chronic sinus issues, usues nasal flushes at home and Flonase.  Urologic: negative for hematuria Abdominal: negative for nausea, vomiting, bright red blood per rectum, melena, or hematemesis Chronic diarrhea after colonoscopy, followed by Dr [name]. Neurologic: negative for visual changes, syncope, or  dizziness Recent sciatica, Rx'd with steroid injection All other systems reviewed and are otherwise negative except as noted above.    Blood pressure 124/70, pulse 74, height 5\' 9"  (1.753 m), weight 267 lb (121.11 kg).  General appearance: alert, cooperative, no distress and morbidly obese Neck: no carotid bruit and no JVD Lungs: clear to auscultation bilaterally Heart: regular rate and rhythm Extremities: trace edema    ASSESSMENT AND PLAN:   PAF (paroxysmal atrial fibrillation) No recurrence since ER visit 08/05/13  Chronic anticoagulation Eliquis  Diastolic dysfunction-grade 2 No CHF  Edema of both legs Lasix prn, recent MRI, venous dopplers negative for DVT  Obesity (BMI 30-39.9) She is in [employer] and has lost 23 lbs.   Sleep apnea She is now on Bi Pap   HTN (hypertension) Controlled   PLAN  I did not change medications. She asked about ASA and fish oil, I told her ASA was relatively contraindicated on Eliquis.  We discussed OTC decongestant use. I applauded her for Wgt loss and Bi Pap compliance. She can see [place] in 6 months or sooner if needed.  Veronica Bradford KPA-C 09/09/2013 11:20 AM

## 2013-09-09 NOTE — Assessment & Plan Note (Signed)
No CHF- 

## 2013-09-09 NOTE — Assessment & Plan Note (Signed)
Eliquis 

## 2013-09-09 NOTE — Assessment & Plan Note (Signed)
She is in [employer] and has lost 23 lbs.

## 2013-09-13 ENCOUNTER — Other Ambulatory Visit: Payer: Self-pay | Admitting: Physician Assistant

## 2013-09-13 ENCOUNTER — Telehealth: Payer: Self-pay | Admitting: Physician Assistant

## 2013-09-13 DIAGNOSIS — E876 Hypokalemia: Secondary | ICD-10-CM

## 2013-09-13 LAB — BASIC METABOLIC PANEL
BUN: 15 mg/dL (ref 6–23)
CHLORIDE: 103 meq/L (ref 96–112)
CO2: 28 mEq/L (ref 19–32)
Calcium: 10.2 mg/dL (ref 8.4–10.5)
Creat: 0.71 mg/dL (ref 0.50–1.10)
GLUCOSE: 90 mg/dL (ref 70–99)
Potassium: 3.7 mEq/L (ref 3.5–5.3)
Sodium: 137 mEq/L (ref 135–145)

## 2013-09-13 LAB — MAGNESIUM: MAGNESIUM: 1.7 mg/dL (ref 1.5–2.5)

## 2013-09-13 NOTE — Telephone Encounter (Signed)
Patient called because she had been struggling with diarrhea for over a month, ever since she had a colonoscopy. She has seen her GI doctor and followed his recommendations, but the symptoms have not resolved. She is feeling weak from this, but felt worse today and developed palpitations. She took her home medications including metoprolol and Eliquis as usual and came to work. She continues to feel bad, and her heart rate is consistently greater than 120.  Advised her that her electrolytes may be altered because of the consistent diarrhea. Advised her that the acute illness may be the trigger for the probable atrial fibrillation. She took her vital signs and her systolic blood pressure is less than 100. Advised her that she may be dehydrated and need IV fluids. Request she go to the emergency room for further evaluation and the patient stated she would do so.

## 2013-09-16 ENCOUNTER — Ambulatory Visit
Admission: RE | Admit: 2013-09-16 | Discharge: 2013-09-16 | Disposition: A | Discharge status: Home / Self Care | Payer: 59 | Source: Ambulatory Visit | Attending: Internal Medicine | Admitting: Internal Medicine

## 2013-09-16 DIAGNOSIS — M8580 Other specified disorders of bone density and structure, unspecified site: Secondary | ICD-10-CM

## 2013-09-18 ENCOUNTER — Other Ambulatory Visit: Payer: Self-pay | Admitting: Internal Medicine

## 2013-09-24 ENCOUNTER — Encounter: Payer: Self-pay | Admitting: Internal Medicine

## 2013-09-24 ENCOUNTER — Ambulatory Visit (INDEPENDENT_AMBULATORY_CARE_PROVIDER_SITE_OTHER): Payer: 59 | Admitting: Internal Medicine

## 2013-09-24 VITALS — BP 108/56 | HR 60 | Ht 68.0 in | Wt 269.2 lb

## 2013-09-24 DIAGNOSIS — I480 Paroxysmal atrial fibrillation: Secondary | ICD-10-CM

## 2013-09-24 DIAGNOSIS — I4891 Unspecified atrial fibrillation: Secondary | ICD-10-CM

## 2013-09-24 MED ORDER — RIVAROXABAN 20 MG PO TABS: 20.0000 mg | ORAL_TABLET | Freq: Every day | ORAL | Status: DC

## 2013-09-24 NOTE — Progress Notes (Signed)
Patient Care Team: Horton Finer, MD as PCP - General (Internal Medicine)   HPI  Veronica Bradford is a 68 y.o. female Seen in followup for AFib   Echo >> normal LV function with LVH  With normal LV  She was seen in the ER 8/15 with recurrent AF   Started on apixoban   She has developed diarrhea or following an also occurred with the discontinuation of her proprietary  brand Nexium and the introduction of apixaban.  It has been a real nuisance and has persisted despite the use of probiotics, yogurt, and the recent introduction of Lomotil. It is abating somewhat.  Past Medical History  Diagnosis Date  . Hypertension   . Hyperlipemia   . GERD (gastroesophageal reflux disease)   . PAF (paroxysmal atrial fibrillation)     Past Surgical History  Procedure Laterality Date  . Total knee arthroplasty    . Abdominal hysterectomy    . Tonsillectomy    . Cholecystectomy      Current Outpatient Prescriptions  Medication Sig Dispense Refill  . acetaminophen-codeine (TYLENOL #3) 300-30 MG per tablet Take 1 tablet by mouth every 4 (four) hours as needed for moderate pain.       . calcium & magnesium carbonates (MYLANTA) 311-232 MG per tablet Take 1 tablet by mouth daily.      . cetirizine (ZYRTEC) 10 MG tablet Take 10 mg by mouth daily.      . cholecalciferol (VITAMIN D) 1000 UNITS tablet Take 2,000 Units by mouth every other day.      . Coenzyme Q10 (CO Q 10) 10 MG CAPS Take 20 mg by mouth daily.      . Diphenoxylate-Atropine (LOMOTIL PO) Take 1-2 capsules by mouth as directed. Every 2/3 days..      . fluticasone (FLONASE) 50 MCG/ACT nasal spray Place 2 sprays into both nostrils daily.      . furosemide (LASIX) 20 MG tablet Take 20 mg by mouth daily as needed for fluid.      . metoprolol tartrate (LOPRESSOR) 12.5 mg TABS tablet Take 0.5 tablets (12.5 mg total) by mouth 2 (two) times daily. Take addition 0.5 tabs if have palpitation  60 tablet  12  . Multiple Vitamins-Minerals  (MULTIVITAMIN PO) Take 1 tablet by mouth daily.      . simvastatin (ZOCOR) 80 MG tablet Take 80 mg by mouth at bedtime.        . valsartan-hydrochlorothiazide (DIOVAN-HCT) 320-25 MG per tablet Take 1 tablet by mouth daily.      . Vitamin D, Ergocalciferol, (DRISDOL) 50000 UNITS CAPS capsule Take 50,000 Units by mouth daily.      . Omega-3 Fatty Acids (FISH OIL) 1200 MG CAPS Take 1,200 mg by mouth daily.       No current facility-administered medications for this visit.    Allergies  Allergen Reactions  . Other Other (See Comments)    Shingles Vaccine>Serum Sickness per patient.  . Amoxicillin Rash    Review of Systems negative except from HPI and PMH  Physical Exam BP 108/56  Ht 5\' 8"  (1.727 m)  Wt 269 lb 3.2 oz (122.108 kg)  BMI 40.94 kg/m2 Well developed and well nourished in no acute distress HENT normal E scleral and icterus clear Neck Supple Clear to ausculation  Regular rate and rhythm, no murmurs gallops or rub Soft with active bowel sounds No clubbing cyanosis 1+ Edema Alert and oriented, grossly normal motor and sensory function Skin  Warm and Dry  ECG demonstrates sinus rhythm at 60 intervals 19/13/42 IVCD  Assessment and  Plan  Atrial fibrillation-paroxysmal   CHADS-VASc score 2 (age/gender)   Diarrhea   The latter problem has been described with apixaban. It was also concurrent with the introduction of her generic Nexium. The latter has been discontinued in favor of an H2 blocker. Her diarrhea seems to be improving a little bit. In the event however, that it does not resolve, I would like to undertake an apixaban exclusion trial.  At that point we will begin her on Rivaroxaban

## 2013-09-24 NOTE — Patient Instructions (Addendum)
Your physician has recommended you make the following change in your medication:  1) STOP Eliquis 2) START Xarelto  Your physician recommends that you schedule a follow-up appointment in: 3 months with Dr. Caryl Comes  Please call us with decision on NOAC - let me know if you stay on Eliquis

## 2013-11-26 ENCOUNTER — Encounter: Payer: Self-pay | Admitting: Internal Medicine

## 2013-12-10 ENCOUNTER — Encounter: Payer: Self-pay | Admitting: Internal Medicine

## 2013-12-10 ENCOUNTER — Ambulatory Visit (INDEPENDENT_AMBULATORY_CARE_PROVIDER_SITE_OTHER): Payer: 59 | Admitting: Internal Medicine

## 2013-12-10 VITALS — BP 122/70 | HR 56 | Ht 68.0 in | Wt 267.0 lb

## 2013-12-10 DIAGNOSIS — I480 Paroxysmal atrial fibrillation: Secondary | ICD-10-CM

## 2013-12-10 LAB — CBC WITH DIFFERENTIAL/PLATELET
BASOS ABS: 0 10*3/uL (ref 0.0–0.1)
Basophils Relative: 0.4 % (ref 0.0–3.0)
Eosinophils Absolute: 0.1 10*3/uL (ref 0.0–0.7)
Eosinophils Relative: 1.6 % (ref 0.0–5.0)
HEMATOCRIT: 40.6 % (ref 36.0–46.0)
Hemoglobin: 13.3 g/dL (ref 12.0–15.0)
LYMPHS ABS: 2 10*3/uL (ref 0.7–4.0)
Lymphocytes Relative: 23.5 % (ref 12.0–46.0)
MCHC: 32.8 g/dL (ref 30.0–36.0)
MCV: 86.6 fl (ref 78.0–100.0)
MONO ABS: 0.7 10*3/uL (ref 0.1–1.0)
MONOS PCT: 8.6 % (ref 3.0–12.0)
NEUTROS PCT: 65.9 % (ref 43.0–77.0)
Neutro Abs: 5.5 10*3/uL (ref 1.4–7.7)
Platelets: 241 10*3/uL (ref 150.0–400.0)
RBC: 4.69 Mil/uL (ref 3.87–5.11)
RDW: 15.1 % (ref 11.5–15.5)
WBC: 8.4 10*3/uL (ref 4.0–10.5)

## 2013-12-10 LAB — BASIC METABOLIC PANEL
BUN: 15 mg/dL (ref 6–23)
CO2: 30 mEq/L (ref 19–32)
Calcium: 10.5 mg/dL (ref 8.4–10.5)
Chloride: 100 mEq/L (ref 96–112)
Creatinine, Ser: 0.9 mg/dL (ref 0.4–1.2)
GFR: 69.24 mL/min (ref >60.00)
Glucose, Bld: 104 mg/dL — ABNORMAL HIGH (ref 70–99)
POTASSIUM: 3.5 meq/L (ref 3.5–5.1)
Sodium: 138 mEq/L (ref 135–145)

## 2013-12-10 LAB — TSH: TSH: 0.88 u[IU]/mL (ref 0.35–4.50)

## 2013-12-10 NOTE — Progress Notes (Signed)
Patient Care Team: Horton Finer, MD as PCP - General (Internal Medicine)   HPI  Veronica Bradford is a 68 y.o. female Seen in followup for AFib   Echo >> normal LV function with LVH  With normal LV  She was seen in the ER 8/15 with recurrent AF   Started on apixoban   She has developed diarrhea or following an also occurred with the discontinuation of her proprietary  brand Nexium and the introduction of apixaban. The diarrhea abated with the discontinuation of the apixaban. She is currently on Rivaroxaban  She has not had any atrial fibrillation in about 3 months.  She has some problems with peripheral edema. Her energy level is notably less than was a year ago. This is true not withstanding the fact that she is currently treated with sleep apnea with a significant reduction in her AHI as well as a 25 pound weight loss         Past Medical History  Diagnosis Date  . Hypertension   . Hyperlipemia   . GERD (gastroesophageal reflux disease)   . PAF (paroxysmal atrial fibrillation)     Past Surgical History  Procedure Laterality Date  . Total knee arthroplasty    . Abdominal hysterectomy    . Tonsillectomy    . Cholecystectomy      Current Outpatient Prescriptions  Medication Sig Dispense Refill  . acetaminophen-codeine (TYLENOL #3) 300-30 MG per tablet Take 1 tablet by mouth every 4 (four) hours as needed for moderate pain.     . Calcium Citrate-Vitamin D (CALCIUM CITRATE + PO) Take by mouth.    . cetirizine (ZYRTEC) 10 MG tablet Take 10 mg by mouth daily.    . cholecalciferol (VITAMIN D) 1000 UNITS tablet Take 2,000 Units by mouth every other day.    . Coenzyme Q10 (CO Q 10) 10 MG CAPS Take 20 mg by mouth daily.    . fluticasone (FLONASE) 50 MCG/ACT nasal spray Place 2 sprays into both nostrils daily.    . furosemide (LASIX) 20 MG tablet Take 20 mg by mouth daily as needed for fluid.    . metoprolol tartrate (LOPRESSOR) 12.5 mg TABS tablet Take 0.5 tablets  (12.5 mg total) by mouth 2 (two) times daily. Take addition 0.5 tabs if have palpitation 60 tablet 12  . Multiple Vitamins-Minerals (MULTIVITAMIN PO) Take 1 tablet by mouth daily.    . Omega-3 Fatty Acids (FISH OIL) 1200 MG CAPS Take 1,200 mg by mouth daily.    . rivaroxaban (XARELTO) 20 MG TABS tablet Take 1 tablet (20 mg total) by mouth daily with supper. 30 tablet 3  . simvastatin (ZOCOR) 80 MG tablet Take 80 mg by mouth at bedtime.      . valsartan-hydrochlorothiazide (DIOVAN-HCT) 320-25 MG per tablet Take 1 tablet by mouth daily.     No current facility-administered medications for this visit.    Allergies  Allergen Reactions  . Other Other (See Comments)    Shingles Vaccine>Serum Sickness per patient.  . Amoxicillin Rash    Review of Systems negative except from HPI and PMH  Physical Exam BP 122/70 mmHg  Pulse 56  Ht 5\' 8"  (1.727 m)  Wt 267 lb (121.11 kg)  BMI 40.61 kg/m2 Well developed and well nourished in no acute distress HENT normal E scleral and icterus clear Neck Supple Clear to ausculation  Regular rate and rhythm, no murmurs gallops or rub Soft with active bowel sounds No clubbing cyanosis  1+ Edema Alert and oriented, grossly normal motor and sensory function Skin Warm and Dry  ECG demonstrates sinus rhythm at 60 intervals 19/13/42 IVCD  Assessment and  Plan  Atrial fibrillation-paroxysmal   CHADS-VASc score 2 (age/gender)   OSA  Obesity  Fatigue  The cause of her fatigue is not clear. TSH was normal in August. Metabolic profile was also normal. Hemoglobin was 14.4. We will recheck these. I wonder whether it is related to her metoprolol notwithstanding the low dose. We will hold it.   We have discussed the strategy as relates to atrial fibrillation. In the event that she has a recurrence, if it persists overnight, or if the symptoms are significant, she will go to the emergency room. She has a prescription requesting the use of flecainide 300 mg as  a one-time dose to see if she will convert. In the event that she does not convert she would need cardioversion. Thereafter, we would determine the role of antiarrhythmic therapy based on the frequency of recurrences.

## 2013-12-10 NOTE — Patient Instructions (Addendum)
Handwritten prescription given to you today for Flecainide 300 mg if in A Fib -- this is to take to the ER with you.  Labs today: CBCD, BMET, TSH  Your physician wants you to follow-up in: 6 months with Dr. Caryl Comes. You will receive a reminder letter in the mail two months in advance. If you don't receive a letter, please call our office to schedule the follow-up appointment.

## 2013-12-11 ENCOUNTER — Ambulatory Visit: Payer: 59 | Admitting: Internal Medicine

## 2013-12-11 ENCOUNTER — Telehealth: Payer: Self-pay | Admitting: Internal Medicine

## 2013-12-11 NOTE — Telephone Encounter (Signed)
F/U  Patient returning call.

## 2013-12-11 NOTE — Telephone Encounter (Signed)
New Msg   Pt saw Dr. Caryl Comes yesterday and he changed meds, pt went into Afib this morning and wants to know if she should go to ER or stop meds. Please call and advise [phone].

## 2013-12-11 NOTE — Telephone Encounter (Signed)
Pt is aware to continue holding the metoprolol and see if the  fatigue gets better , and follow Dr. Olin Pia recommendations if on persistent A-fib overnight and have significant symptoms.

## 2013-12-11 NOTE — Telephone Encounter (Signed)
Pt was seen by Dr. Caryl Comes yesterday pt C/O of  fatigue MD think that it could be the Metoprolol . Pt was taking metoprolol 12.5 mg BID. The Metoprolol was D/C and pt was given a written prescription for Flecainide 300 mg as one time dose to take to the ER, if she has recurrent A-Fib. In the event if she has a recurrence if it persist it overnight or if her symptoms are significant. Pt started to have A-fib on early morning rate 120' beats/minute at 4:30 AM pt took 25 mg Metoprolol. By 8:30 AM HR was between 60 to 70 beats/minute regular she denies any symptoms now she  Took  Xarelto a few minutes ago after she had a procedure. Pt would like to know if continue  taken the Metoprolol 12.5 mg BID as before , because she has not being in A-fib for sometime since starting the Metoprolol. Pt states that Dr. Caryl Comes mention to her some other betablocker that she could take.   Dr.Ross DOD aware she states that the A-fib episode may have been  Incidental   and recommends for pt to give a time to see if she feels better holding the Metoprolol. Left pt a message to call back.

## 2014-01-08 ENCOUNTER — Ambulatory Visit: Payer: 59 | Attending: Sports Medicine

## 2014-01-08 DIAGNOSIS — M7521 Bicipital tendinitis, right shoulder: Secondary | ICD-10-CM | POA: Insufficient documentation

## 2014-01-08 DIAGNOSIS — M25511 Pain in right shoulder: Secondary | ICD-10-CM | POA: Insufficient documentation

## 2014-01-13 ENCOUNTER — Ambulatory Visit: Payer: 59

## 2014-01-13 DIAGNOSIS — M7521 Bicipital tendinitis, right shoulder: Secondary | ICD-10-CM | POA: Diagnosis not present

## 2014-01-15 ENCOUNTER — Ambulatory Visit: Payer: 59

## 2014-01-15 DIAGNOSIS — M7521 Bicipital tendinitis, right shoulder: Secondary | ICD-10-CM | POA: Diagnosis not present

## 2014-01-20 ENCOUNTER — Ambulatory Visit: Payer: 59

## 2014-01-20 DIAGNOSIS — M7521 Bicipital tendinitis, right shoulder: Secondary | ICD-10-CM | POA: Diagnosis not present

## 2014-01-23 ENCOUNTER — Ambulatory Visit: Payer: 59 | Admitting: Physical Therapy

## 2014-01-27 ENCOUNTER — Ambulatory Visit: Payer: 59

## 2014-01-27 DIAGNOSIS — M7521 Bicipital tendinitis, right shoulder: Secondary | ICD-10-CM | POA: Diagnosis not present

## 2014-01-30 ENCOUNTER — Ambulatory Visit: Payer: 59 | Admitting: Rehabilitation

## 2014-02-02 ENCOUNTER — Ambulatory Visit: Payer: 59 | Attending: Sports Medicine

## 2014-02-02 DIAGNOSIS — M25511 Pain in right shoulder: Secondary | ICD-10-CM | POA: Insufficient documentation

## 2014-02-02 DIAGNOSIS — M7521 Bicipital tendinitis, right shoulder: Secondary | ICD-10-CM | POA: Diagnosis present

## 2014-02-04 ENCOUNTER — Telehealth: Payer: Self-pay | Admitting: Internal Medicine

## 2014-02-04 NOTE — Telephone Encounter (Signed)
Called and spoke with Freda Munro, CMA for Dr. [name]. Informed her that Dr. Caryl Comes nor his nurse were in the office today but that I would forward this message to them to let them know that they are awaiting clearance to hold Xarelto.  Informed Freda Munro that Dr. Caryl Comes would not be back in the office until tomorrow.  Freda Munro verbalized understanding and was in agreement with plan.

## 2014-02-04 NOTE — Telephone Encounter (Signed)
New MEssage  Janett Billow from  [facility] called about status of clearance  Request for surgical clearance:  1. What type of surgery is being performed? endoscopy  2. When is this surgery scheduled? 2/8 monday  3. Are there any medications that need to be held prior to surgery and how long? Xarelto-   4. Name of physician performing surgery? Dr. Herbie Baltimore Buccini  5. What is your office phone and fax number? [phone] 6.

## 2014-02-05 NOTE — Telephone Encounter (Signed)
Should be held for 48 hrs  thaniks steve

## 2014-02-06 ENCOUNTER — Ambulatory Visit: Payer: 59 | Admitting: Rehabilitation

## 2014-02-06 ENCOUNTER — Encounter: Payer: Self-pay | Admitting: *Deleted

## 2014-02-06 NOTE — Telephone Encounter (Signed)
Faxed order to hold Xarelto to [facility].

## 2014-02-09 HISTORY — PX: ESOPHAGOGASTRODUODENOSCOPY: SHX1529

## 2014-02-16 ENCOUNTER — Encounter (HOSPITAL_COMMUNITY): Payer: Self-pay | Admitting: Neurology

## 2014-02-16 ENCOUNTER — Emergency Department (HOSPITAL_COMMUNITY)
Admission: EM | Admit: 2014-02-16 | Discharge: 2014-02-16 | Disposition: A | Discharge status: Home / Self Care | Payer: 59 | Attending: Emergency Medicine | Admitting: Emergency Medicine

## 2014-02-16 DIAGNOSIS — Z8719 Personal history of other diseases of the digestive system: Secondary | ICD-10-CM | POA: Insufficient documentation

## 2014-02-16 DIAGNOSIS — I4891 Unspecified atrial fibrillation: Secondary | ICD-10-CM | POA: Diagnosis present

## 2014-02-16 DIAGNOSIS — Z880 Allergy status to penicillin: Secondary | ICD-10-CM | POA: Insufficient documentation

## 2014-02-16 DIAGNOSIS — E785 Hyperlipidemia, unspecified: Secondary | ICD-10-CM | POA: Diagnosis not present

## 2014-02-16 DIAGNOSIS — Z79899 Other long term (current) drug therapy: Secondary | ICD-10-CM | POA: Insufficient documentation

## 2014-02-16 DIAGNOSIS — I480 Paroxysmal atrial fibrillation: Secondary | ICD-10-CM | POA: Insufficient documentation

## 2014-02-16 DIAGNOSIS — Z7901 Long term (current) use of anticoagulants: Secondary | ICD-10-CM | POA: Insufficient documentation

## 2014-02-16 DIAGNOSIS — Z7951 Long term (current) use of inhaled steroids: Secondary | ICD-10-CM | POA: Diagnosis not present

## 2014-02-16 DIAGNOSIS — I10 Essential (primary) hypertension: Secondary | ICD-10-CM | POA: Insufficient documentation

## 2014-02-16 DIAGNOSIS — Z87891 Personal history of nicotine dependence: Secondary | ICD-10-CM | POA: Diagnosis not present

## 2014-02-16 LAB — BASIC METABOLIC PANEL
ANION GAP: 3 — AB (ref 5–15)
BUN: 12 mg/dL (ref 6–23)
CHLORIDE: 109 mmol/L (ref 96–112)
CO2: 30 mmol/L (ref 19–32)
Calcium: 10.2 mg/dL (ref 8.4–10.5)
Creatinine, Ser: 0.65 mg/dL (ref 0.50–1.10)
GFR calc Af Amer: >90 mL/min (ref >90)
GLUCOSE: 103 mg/dL — AB (ref 70–99)
Potassium: 4.4 mmol/L (ref 3.5–5.1)
Sodium: 142 mmol/L (ref 135–145)

## 2014-02-16 LAB — MAGNESIUM: Magnesium: 2.1 mg/dL (ref 1.5–2.5)

## 2014-02-16 MED ORDER — FLECAINIDE ACETATE 150 MG PO TABS: 300.0000 mg | ORAL_TABLET | ORAL | Status: DC | PRN

## 2014-02-16 MED ORDER — FLECAINIDE ACETATE 100 MG PO TABS
300.0000 mg | ORAL_TABLET | Freq: Once | ORAL | Status: AC
  Administered 2014-02-16: 300 mg via ORAL
  Filled 2014-02-16 (×2): qty 3

## 2014-02-16 NOTE — Discharge Instructions (Signed)

## 2014-02-16 NOTE — ED Provider Notes (Signed)
CSN: [id number]     Arrival date & time 02/16/14  6010 History   First MD Initiated Contact with Patient 02/16/14 1002     Chief Complaint  Patient presents with  . Atrial Fibrillation     (Consider location/radiation/quality/duration/timing/severity/associated sxs/prior Treatment) Patient is a 69 y.o. female presenting with atrial fibrillation. The history is provided by the patient.  Atrial Fibrillation This is a recurrent problem. The current episode started 6 to 12 hours ago. The problem occurs constantly. The problem has not changed since onset.Pertinent negatives include no chest pain, no abdominal pain, no headaches and no shortness of breath. Nothing aggravates the symptoms. Nothing relieves the symptoms.    Past Medical History  Diagnosis Date  . Hypertension   . Hyperlipemia   . GERD (gastroesophageal reflux disease)   . PAF (paroxysmal atrial fibrillation)    Past Surgical History  Procedure Laterality Date  . Total knee arthroplasty    . Abdominal hysterectomy    . Tonsillectomy    . Cholecystectomy     Family History  Problem Relation Age of Onset  . Hypertension Mother   . Cancer Mother     ovarian, colon and breast  . Hypertension Father   . Diabetes Father    History  Substance Use Topics  . Smoking status: Former Smoker    Quit date: 01/06/1980  . Smokeless tobacco: Never Used  . Alcohol Use: 2.4 oz/week    4 Glasses of wine per week     Comment: glass of wine 4 nights a week   OB History    No data available     Review of Systems  Constitutional: Negative for fever and chills.  Respiratory: Negative for shortness of breath.   Cardiovascular: Negative for chest pain.  Gastrointestinal: Negative for abdominal pain.  Neurological: Negative for headaches.  All other systems reviewed and are negative.     Allergies  Other and Amoxicillin  Home Medications   Prior to Admission medications   Medication Sig Start Date End Date Taking?  Authorizing Provider  acetaminophen-codeine (TYLENOL #3) 300-30 MG per tablet Take 1 tablet by mouth every 4 (four) hours as needed for moderate pain.     Historical Provider, MD  Calcium Citrate-Vitamin D (CALCIUM CITRATE + PO) Take by mouth.    Historical Provider, MD  cetirizine (ZYRTEC) 10 MG tablet Take 10 mg by mouth daily.    Historical Provider, MD  cholecalciferol (VITAMIN D) 1000 UNITS tablet Take 2,000 Units by mouth every other day.    Historical Provider, MD  Coenzyme Q10 (CO Q 10) 10 MG CAPS Take 20 mg by mouth daily.    Historical Provider, MD  fluticasone (FLONASE) 50 MCG/ACT nasal spray Place 2 sprays into both nostrils daily.    Historical Provider, MD  furosemide (LASIX) 20 MG tablet Take 20 mg by mouth daily as needed for fluid.    Historical Provider, MD  metoprolol tartrate (LOPRESSOR) 12.5 mg TABS tablet Take 0.5 tablets (12.5 mg total) by mouth 2 (two) times daily. Take addition 0.5 tabs if have palpitation 08/05/13   Almyra Deforest, PA  Multiple Vitamins-Minerals (MULTIVITAMIN PO) Take 1 tablet by mouth daily.    Historical Provider, MD  Omega-3 Fatty Acids (FISH OIL) 1200 MG CAPS Take 1,200 mg by mouth daily.    Historical Provider, MD  rivaroxaban (XARELTO) 20 MG TABS tablet Take 1 tablet (20 mg total) by mouth daily with supper. 09/24/13   [name], MD  simvastatin [facility])  80 MG tablet Take 80 mg by mouth at bedtime.      Historical Provider, MD  valsartan-hydrochlorothiazide (DIOVAN-HCT) 320-25 MG per tablet Take 1 tablet by mouth daily.    Historical Provider, MD   BP 116/62 mmHg  Pulse 51  Temp(Src) 97.8 F (36.6 C) (Oral)  Resp 18  SpO2 97% Physical Exam  Constitutional: She is oriented to person, place, and time. She appears well-developed and well-nourished. No distress.  HENT:  Head: Normocephalic and atraumatic.  Mouth/Throat: Oropharynx is clear and moist.  Eyes: EOM are normal. Pupils are equal, round, and reactive to light.  Neck: Normal range of motion.  Neck supple.  Cardiovascular: Normal rate.  An irregularly irregular rhythm present. Exam reveals no friction rub.   No murmur heard. Pulmonary/Chest: Effort normal and breath sounds normal. No respiratory distress. She has no wheezes. She has no rales.  Abdominal: Soft. She exhibits no distension. There is no tenderness. There is no rebound.  Musculoskeletal: Normal range of motion. She exhibits no edema.  Neurological: She is alert and oriented to person, place, and time.  Skin: No rash noted. She is not diaphoretic.  Nursing note and vitals reviewed.   ED Course  Procedures (including critical care time) Labs Review Labs Reviewed - No data to display  Imaging Review No results found.   EKG Interpretation   Date/Time:  Monday February 16 2014 10:06:12 EST Ventricular Rate:  102 PR Interval:    QRS Duration: 129 QT Interval:  349 QTC Calculation: 455 R Axis:   -29 Text Interpretation:  Atrial fibrillation LVH with secondary  repolarization abnormality Anterior Q waves, possibly due to LVH Baseline  wander in lead(s) I II aVR aVF Afib similar to prior Confirmed by Mingo Amber   MD, [place] ([id number]) on 02/16/2014 10:16:06 AM      EKG Interpretation  Date/Time:  Monday February 16 2014 12:12:03 EST Ventricular Rate:  99 PR Interval:    QRS Duration: 146 QT Interval:  373 QTC Calculation: 479 R Axis:   -41 Text Interpretation:  Sinus rhythm  LVH with IVCD, LAD and secondary repol abnrm Baseline wander in lead(s) V2 Sinus rhythm, similar to prior Confirmed by Mingo Amber  MD, [place] ([id number]) on 02/16/2014 12:16:35 PM        MDM   Final diagnoses:  Paroxysmal atrial fibrillation    82F here with Afib. Takes Xarelto chronically for this. Flipped into Afib about 9 hours ago. Doesn't take any rate control medications. Has had extensive w/u for Afib, has Rx from Dr. Caryl Comes with Cardiology to take a 300 mg Flecainide whenever she is in Afib. Will give 300 mg flecainide. Exam benign except  for irregularly irreguarl HR with occasional RVR.  Converted to sinus rhythm, I spoke with Cards who recommended watching her for 2 hours and checking a BMP and a magnesium.   I spoke with Dr. Mare Ferrari about sending her home with flecainide Rx for "pill in the pocket" events. Not to take daily. Patient states this had been discussed previously. He stated that was ok. I sent her EP doctor, Dr. Caryl Comes, a message through epic about this.  After observation, has not had any arrhythmias, still in sinus rhythm. Stable for discharge.     Evelina Bucy, MD 02/16/14 [phone]

## 2014-02-16 NOTE — ED Notes (Signed)
Pt called out, feels like her heart is back in rhythm. EKG obtained. Appears to be in SR.

## 2014-02-16 NOTE — ED Notes (Signed)
Pt reports hx of afib intermittently, last night pt woke up in the night at 0100 with irregular, fast HR of 120. She took metoprolol 12.5 mg. This morning at 0530 took another 12.5 mg metoprolol. Has rx from Dr. Caryl Comes for fecainide. Denies cp. Pt is a x 4. AFIB on monitor.

## 2014-02-20 ENCOUNTER — Telehealth: Payer: Self-pay | Admitting: Internal Medicine

## 2014-02-20 NOTE — Telephone Encounter (Signed)
New Message  Pt was seen at ED on 2/15 for Afib. Per pt- was told to see if Dr. Caryl Comes needed to see pt before June recall. Nothing in AVS about f/u appt. Pt wanted to speak w/ Rn about what to do going forward. Please call vback and discuss,.

## 2014-02-20 NOTE — Telephone Encounter (Signed)
Informed patient that follow up in June should be sufficient unless symptomatic or uncontrolled.  Patient will let me know if frequency increases. She is aware I will review this with Dr. Caryl Comes when he returns at the beginning of March.

## 2014-03-02 NOTE — Telephone Encounter (Addendum)
Confirmed with patient that she has Flecainide at home if she needs for prn. Advised her to keep follow up in June, unless AFib worsens/symptomatic/problematic. She tells me that she thinks her AFib episodes are corresponding to stress.  She is keeping an eye on this to see if correlation continues when she is in stressful situations. She is agreeable to plan.

## 2014-03-03 ENCOUNTER — Other Ambulatory Visit: Payer: Self-pay | Admitting: Internal Medicine

## 2014-03-11 ENCOUNTER — Other Ambulatory Visit (HOSPITAL_COMMUNITY): Payer: Self-pay | Admitting: Sports Medicine

## 2014-03-11 ENCOUNTER — Other Ambulatory Visit: Payer: Self-pay | Admitting: Nurse Practitioner

## 2014-03-11 ENCOUNTER — Other Ambulatory Visit (HOSPITAL_COMMUNITY)
Admission: RE | Admit: 2014-03-11 | Discharge: 2014-03-11 | Disposition: A | Discharge status: Home / Self Care | Payer: 59 | Source: Ambulatory Visit | Attending: Nurse Practitioner | Admitting: Nurse Practitioner

## 2014-03-11 DIAGNOSIS — Z1151 Encounter for screening for human papillomavirus (HPV): Secondary | ICD-10-CM | POA: Insufficient documentation

## 2014-03-11 DIAGNOSIS — Z01419 Encounter for gynecological examination (general) (routine) without abnormal findings: Secondary | ICD-10-CM | POA: Insufficient documentation

## 2014-03-11 DIAGNOSIS — M778 Other enthesopathies, not elsewhere classified: Secondary | ICD-10-CM

## 2014-03-11 DIAGNOSIS — M7581 Other shoulder lesions, right shoulder: Principal | ICD-10-CM

## 2014-03-16 ENCOUNTER — Ambulatory Visit (HOSPITAL_COMMUNITY): Payer: 59

## 2014-03-16 LAB — CYTOLOGY - PAP

## 2014-03-24 ENCOUNTER — Ambulatory Visit (HOSPITAL_COMMUNITY)
Admission: RE | Admit: 2014-03-24 | Discharge: 2014-03-24 | Disposition: A | Discharge status: Home / Self Care | Payer: 59 | Source: Ambulatory Visit | Attending: Sports Medicine | Admitting: Sports Medicine

## 2014-03-24 DIAGNOSIS — M7581 Other shoulder lesions, right shoulder: Secondary | ICD-10-CM | POA: Insufficient documentation

## 2014-03-24 DIAGNOSIS — M778 Other enthesopathies, not elsewhere classified: Secondary | ICD-10-CM

## 2014-03-24 DIAGNOSIS — M25511 Pain in right shoulder: Secondary | ICD-10-CM | POA: Diagnosis present

## 2014-06-25 ENCOUNTER — Ambulatory Visit: Payer: 59 | Admitting: Family Medicine

## 2014-06-29 ENCOUNTER — Other Ambulatory Visit: Payer: Self-pay

## 2014-07-02 ENCOUNTER — Other Ambulatory Visit: Payer: Self-pay | Admitting: Internal Medicine

## 2014-07-14 ENCOUNTER — Ambulatory Visit: Payer: 59 | Admitting: Internal Medicine

## 2014-08-12 ENCOUNTER — Telehealth: Payer: Self-pay | Admitting: Behavioral Health

## 2014-08-12 NOTE — Telephone Encounter (Signed)
Pre-Visit Call completed with patient and chart updated.   Pre-Visit Info documented in Specialty Comments under [employer].    

## 2014-08-12 NOTE — Telephone Encounter (Signed)
Last note was an error.  Unable to reach patient at time of Pre-Visit Call.  Left message for patient to return call when available.

## 2014-08-13 ENCOUNTER — Ambulatory Visit (INDEPENDENT_AMBULATORY_CARE_PROVIDER_SITE_OTHER): Payer: 59 | Admitting: Internal Medicine

## 2014-08-13 ENCOUNTER — Encounter: Payer: Self-pay | Admitting: Internal Medicine

## 2014-08-13 VITALS — BP 138/76 | HR 77 | Temp 97.8°F | Ht 68.0 in | Wt 288.4 lb

## 2014-08-13 DIAGNOSIS — K589 Irritable bowel syndrome without diarrhea: Secondary | ICD-10-CM | POA: Diagnosis not present

## 2014-08-13 DIAGNOSIS — R7309 Other abnormal glucose: Secondary | ICD-10-CM | POA: Diagnosis not present

## 2014-08-13 DIAGNOSIS — K219 Gastro-esophageal reflux disease without esophagitis: Secondary | ICD-10-CM | POA: Insufficient documentation

## 2014-08-13 DIAGNOSIS — I10 Essential (primary) hypertension: Secondary | ICD-10-CM

## 2014-08-13 DIAGNOSIS — R739 Hyperglycemia, unspecified: Secondary | ICD-10-CM | POA: Insufficient documentation

## 2014-08-13 DIAGNOSIS — J329 Chronic sinusitis, unspecified: Secondary | ICD-10-CM | POA: Insufficient documentation

## 2014-08-13 DIAGNOSIS — M545 Low back pain, unspecified: Secondary | ICD-10-CM

## 2014-08-13 DIAGNOSIS — R7303 Prediabetes: Secondary | ICD-10-CM

## 2014-08-13 DIAGNOSIS — J320 Chronic maxillary sinusitis: Secondary | ICD-10-CM

## 2014-08-13 DIAGNOSIS — C541 Malignant neoplasm of endometrium: Secondary | ICD-10-CM | POA: Insufficient documentation

## 2014-08-13 DIAGNOSIS — M549 Dorsalgia, unspecified: Secondary | ICD-10-CM | POA: Insufficient documentation

## 2014-08-13 HISTORY — DX: Irritable bowel syndrome, unspecified: K58.9

## 2014-08-13 LAB — BASIC METABOLIC PANEL
BUN: 15 mg/dL (ref 6–23)
CO2: 27 mEq/L (ref 19–32)
Calcium: 10.6 mg/dL — ABNORMAL HIGH (ref 8.4–10.5)
Chloride: 107 mEq/L (ref 96–112)
Creatinine, Ser: 0.7 mg/dL (ref 0.40–1.20)
GFR: 88.8 mL/min (ref >60.00)
GLUCOSE: 111 mg/dL — AB (ref 70–99)
Potassium: 3.7 mEq/L (ref 3.5–5.1)
Sodium: 142 mEq/L (ref 135–145)

## 2014-08-13 LAB — HEMOGLOBIN A1C: HEMOGLOBIN A1C: 5.7 % (ref 4.6–6.5)

## 2014-08-13 MED ORDER — CIPROFLOXACIN HCL 500 MG PO TABS: 500.0000 mg | ORAL_TABLET | Freq: Two times a day (BID) | ORAL | Status: DC

## 2014-08-13 NOTE — Progress Notes (Signed)
Pre visit review using our clinic review tool, if applicable. No additional management support is needed unless otherwise documented below in the visit note. 

## 2014-08-13 NOTE — Patient Instructions (Addendum)
Get your blood work before you leave   Use ciprofloxacin if you develop traveler's diarrhea while in [place]. If you have severe symptoms you need to see a local M.D.  Please sttop by the front desk to be sure we have a release of information, we need to get records from the physician @ [facility].

## 2014-08-13 NOTE — Progress Notes (Signed)
Subjective:    Patient ID: Veronica Bradford, female    DOB: Sep 29, 1945, 69 y.o.   MRN: [id number]  DOS:  08/13/2014 Type of visit - description : New patient, to get established, previous PCP Dr. Maxwell Caul Interval history: Sleep apnea, still  follow-up by Dr. Maxwell Caul, on CPAP. Atrial fibrillation: Anticoagulated, rate is well-controlled today High cholesterol, on statins, no apparent side effects. Hypertension: Good compliance of medication, ambulatory BPs 135-145/70. Lower extremity edema: A chronic problem, on Lasix as needed, she has noted that tramadol increases the swelling. Chronic back pain: On Tylenol # 3 as needed. GERD: On Protonix, was unable to stop PPIs.  Review of Systems Denies chest pain or difficulty breathing No nausea vomiting. History of IBS, occasional diarrhea. No dysphagia or odynophagia  Past Medical History  Diagnosis Date  . Hypertension   . Hyperlipemia   . GERD (gastroesophageal reflux disease)   . PAF (paroxysmal atrial fibrillation)   . Prediabetes   . Chronic sinusitis     s/p 2 surgeries remotely, [name]  . Endometrial cancer     found during hysterectomy, no chemo,XRT  . Back pain   . GERD (gastroesophageal reflux disease)   . Sleep apnea 09/09/2013  . IBS (irritable bowel syndrome) 08/13/2014    Dx 2015, diarrhea on-off, Dr [name]     Past Surgical History  Procedure Laterality Date  . Total knee arthroplasty Right ~2008  . Abdominal hysterectomy      had B oophorectomy  . Tonsillectomy    . Cholecystectomy  ~2006  . Nasal sinus surgery      x 2 remotely, [name]    Social History   Social History  . Marital Status: Married    Spouse Name: N/A  . Number of Children: 1  . Years of Education: N/A   Occupational History  . RN-ICU    Social History Main Topics  . Smoking status: Former Smoker    Quit date: 01/06/1980  . Smokeless tobacco: Never Used  . Alcohol Use: 2.4 oz/week    4 Glasses of wine per week     Comment:  glass of wine 4 nights a week  . Drug Use: No  . Sexual Activity: Not on file   Other Topics Concern  . Not on file   Social History Narrative     Family History  Problem Relation Age of Onset  . Hypertension Mother   . Cancer Mother     ovarian, colon and breast  . Hypertension Father   . Diabetes Father        Medication List       This list is accurate as of: 08/13/14  2:15 PM.  Always use your most recent med list.               acetaminophen-codeine 300-30 MG per tablet  Commonly known as:  TYLENOL #3  Take 1 tablet by mouth daily as needed for moderate pain.     CALCIUM CITRATE + PO  Take by mouth.     cetirizine 10 MG tablet  Commonly known as:  ZYRTEC  Take 10 mg by mouth daily.     cholecalciferol 1000 UNITS tablet  Commonly known as:  VITAMIN D  Take 2,000 Units by mouth every other day.     ciprofloxacin 500 MG tablet  Commonly known as:  CIPRO  Take 1 tablet (500 mg total) by mouth 2 (two) times daily.     Co Q 10  10 MG Caps  Take 100 mg by mouth daily.     Fish Oil 1200 MG Caps  Take 1,200 mg by mouth daily.     flecainide 150 MG tablet  Commonly known as:  TAMBOCOR  Take 2 tablets (300 mg total) by mouth as needed (for paroxysmal atrial fibrillation).     fluticasone 50 MCG/ACT nasal spray  Commonly known as:  FLONASE  Place 2 sprays into both nostrils daily.     furosemide 20 MG tablet  Commonly known as:  LASIX  Take 20 mg by mouth daily as needed for fluid.     metoprolol tartrate 12.5 mg Tabs tablet  Commonly known as:  LOPRESSOR  Take 0.5 tablets (12.5 mg total) by mouth 2 (two) times daily. Take addition 0.5 tabs if have palpitation     MULTIVITAMIN PO  Take 1 tablet by mouth daily.     oxymetazoline 0.05 % nasal spray  Commonly known as:  AFRIN  Place 1 spray into both nostrils at bedtime.     pantoprazole 40 MG tablet  Commonly known as:  PROTONIX  Take 40 mg by mouth daily.     ranitidine 150 MG capsule  Commonly  known as:  ZANTAC  Take 150 mg by mouth daily as needed for heartburn.     simvastatin 80 MG tablet  Commonly known as:  ZOCOR  Take 40 mg by mouth at bedtime.     valsartan-hydrochlorothiazide 320-25 MG per tablet  Commonly known as:  DIOVAN-HCT  Take 1 tablet by mouth daily.     XARELTO 20 MG Tabs tablet  Generic drug:  rivaroxaban  TAKE 1 TABLET (20 MG) BY MOUTH DAILY WITH SUPPER.           Objective:   Physical Exam BP 138/76 mmHg  Pulse 77  Temp(Src) 97.8 F (36.6 C) (Oral)  Ht 5\' 8"  (1.727 m)  Wt 288 lb 6 oz (130.806 kg)  BMI 43.86 kg/m2  SpO2 97% General:   Well developed, well nourished . NAD.  HEENT:  Normocephalic . Face symmetric, atraumatic Lungs:  CTA B Normal respiratory effort, no intercostal retractions, no accessory muscle use. Heart: RRR,  no murmur.  +/+++ pretibial edema bilaterally  Skin: Not pale. Not jaundice Neurologic:  alert & oriented X3.  Speech normal, gait appropriate for age and unassisted Psych--  Cognition and judgment appear intact.  Cooperative with normal attention span and concentration.  Behavior appropriate. No anxious or depressed appearing.      Assessment & Plan:   Edema: Request a prescription to get an adjustable mattress that will allow leg elevation. Will do.  Hypertension: Continue with present care, check a BMP  History of A-fib: Sees cardiology regularly.  Sleep apnea: On CPAP, managed by Dr. Maxwell Caul  Prediabetes: Check A1c  Back pain:  on Tylenol 3 as needed. Use to take tramadol but it caused swelling.  History of endometrial cancer: Found during a hysterectomy, did not require radiation or chemotherapy, just observation. Had a PAP  with previous PCP 04-2014 and was told it was normal.  GERD: At some point she tried to stop PPIs and take H2 blockers but that didn't work for her.  High cholesterol: Get records  Going to [place], request a medication for diarrhea. We'll try Cipro as needed. See  instructions

## 2014-08-27 ENCOUNTER — Telehealth: Payer: Self-pay | Admitting: *Deleted

## 2014-08-27 NOTE — Telephone Encounter (Signed)
Medical records received via fax from [place]. Forwarded to [place]. JG//CMA

## 2014-09-22 ENCOUNTER — Ambulatory Visit (INDEPENDENT_AMBULATORY_CARE_PROVIDER_SITE_OTHER): Payer: 59 | Admitting: Internal Medicine

## 2014-09-22 ENCOUNTER — Encounter: Payer: Self-pay | Admitting: Internal Medicine

## 2014-09-22 VITALS — BP 128/70 | HR 68 | Ht 68.5 in | Wt 291.4 lb

## 2014-09-22 DIAGNOSIS — I480 Paroxysmal atrial fibrillation: Secondary | ICD-10-CM

## 2014-09-22 NOTE — Patient Instructions (Signed)
Medication Instructions:  Your physician recommends that you continue on your current medications as directed. Please refer to the Current Medication list given to you today.  Labwork: None ordered  Testing/Procedures: None ordered  Follow-Up: Your physician wants you to follow-up in: 1 year with Dr. Klein.  You will receive a reminder letter in the mail two months in advance. If you don't receive a letter, please call our office to schedule the follow-up appointment.  Any Other Special Instructions Will Be Listed Below (If Applicable). Thank you for choosing [facility]!!        

## 2014-09-22 NOTE — Progress Notes (Signed)
Patient Care Team: Colon Branch, MD as PCP - General (Internal Medicine) Carlena Sax, MD as Consulting Physician (Internal Medicine)   HPI  Veronica Bradford is a 69 y.o. female Seen in followup for AFib   Echo >> normal LV function with LVH  With normal LV  She was seen in the ER 8/15 with recurrent AF   Started on apixoban   She has developed diarrhea or following an also occurred with the discontinuation of her proprietary  brand Nexium and the introduction of apixaban. The diarrhea abated with the discontinuation of the apixaban. She is currently on Rivaroxaban  She hadatrial fibrillation  2/16 and went to the ER for possible AFib.  receivied flecainide >>> NSR    She has had 2 other episodes of afib each lasting about 3 hrs and terminating with fleciainide  She is extremely fatigued afterwards  Otherwise without complaints of sob cp edema or palpitations compiant with CXPAP           Past Medical History  Diagnosis Date  . Hypertension   . Hyperlipemia   . GERD (gastroesophageal reflux disease)   . PAF (paroxysmal atrial fibrillation)   . Prediabetes   . Chronic sinusitis     s/p 2 surgeries remotely, [name]  . Endometrial cancer     found during hysterectomy, no chemo,XRT  . Back pain   . GERD (gastroesophageal reflux disease)   . Sleep apnea 09/09/2013    Dx with OSA in 2015, by Dr. Caryl Comes, APAP  . IBS (irritable bowel syndrome) 08/13/2014    Dx 2015, diarrhea on-off, Dr [name]   . Ectopic pregnancy   . Osteopenia   . Mild depression     Pt's son has Down's Syndrome  . Sciatica of right side 08/2013    Received Depomedrol 80 mg injection  . Lumbar radiculopathy 08/2012    MRI in 08/2012  . Endometrial cancer 2004    Early diagnosis  . Mild hyperparathyroidism     Past Surgical History  Procedure Laterality Date  . Total knee arthroplasty Right ~2008  . Abdominal hysterectomy      had B oophorectomy  . Tonsillectomy    . Cholecystectomy  ~2006  .  Nasal sinus surgery      x 2 remotely, [name]  . Finger surgery      Left index    Current Outpatient Prescriptions  Medication Sig Dispense Refill  . acetaminophen-codeine (TYLENOL #3) 300-30 MG per tablet Take 1 tablet by mouth daily as needed for moderate pain.     . Calcium Citrate-Vitamin D (CALCIUM CITRATE + PO) Take by mouth.    . cetirizine (ZYRTEC) 10 MG tablet Take 10 mg by mouth daily.    . cholecalciferol (VITAMIN D) 1000 UNITS tablet Take 2,000 Units by mouth every other day.    . ciprofloxacin (CIPRO) 500 MG tablet Take 1 tablet (500 mg total) by mouth 2 (two) times daily. (Patient taking differently: Take 500 mg by mouth 2 (two) times daily. Prn for dental work) 6 tablet 0  . Coenzyme Q10 (CO Q 10) 10 MG CAPS Take 100 mg by mouth daily.     . flecainide (TAMBOCOR) 150 MG tablet Take 2 tablets (300 mg total) by mouth as needed (for paroxysmal atrial fibrillation). 10 tablet 0  . fluticasone (FLONASE) 50 MCG/ACT nasal spray Place 2 sprays into both nostrils daily.    . furosemide (LASIX) 20 MG tablet Take 20  mg by mouth daily as needed for fluid.    . metoprolol tartrate (LOPRESSOR) 12.5 mg TABS tablet Take 0.5 tablets (12.5 mg total) by mouth 2 (two) times daily. Take addition 0.5 tabs if have palpitation 60 tablet 12  . Multiple Vitamins-Minerals (MULTIVITAMIN PO) Take 1 tablet by mouth daily.    . Omega-3 Fatty Acids (FISH OIL) 1200 MG CAPS Take 1,200 mg by mouth daily.    Marland Kitchen oxymetazoline (AFRIN) 0.05 % nasal spray Place 1 spray into both nostrils at bedtime.     . pantoprazole (PROTONIX) 40 MG tablet Take 40 mg by mouth daily.    . ranitidine (ZANTAC) 150 MG capsule Take 150 mg by mouth daily as needed for heartburn.    . simvastatin (ZOCOR) 80 MG tablet Take 40 mg by mouth at bedtime.     . valsartan-hydrochlorothiazide (DIOVAN-HCT) 320-25 MG per tablet Take 1 tablet by mouth daily.    Alveda Reasons 20 MG TABS tablet TAKE 1 TABLET (20 MG) BY MOUTH DAILY WITH SUPPER. 30  tablet 3   No current facility-administered medications for this visit.    Allergies  Allergen Reactions  . Nsaids Other (See Comments)    Pt on Xarelto  . Other Other (See Comments)    Shingles Vaccine>Serum Sickness per patient.  . Tramadol Swelling  . Amoxicillin Rash    Review of Systems negative except from HPI and PMH  Physical Exam BP 128/70 mmHg  Pulse 68  Ht 5' 8.5" (1.74 m)  Wt 291 lb 6.4 oz (132.178 kg)  BMI 43.66 kg/m2 Well developed and well nourished in no acute distress HENT normal E scleral and icterus clear Neck Supple Clear to ausculation  Regular rate and rhythm, no murmurs gallops or rub Soft with active bowel sounds No clubbing cyanosis 1+ Edema Alert and oriented, grossly normal motor and sensory function Skin Warm and Dry  ECG demonstrates sinus rhythm at 60 intervals 19/13/42 IVCD  Assessment and  Plan  Atrial fibrillation-paroxysmal   CHADS-VASc score 2 (age/gender)   OSA wqell treated  Obesity  Fatigue  She will conitinue the protocol of prn flecaindie which is working   4 episodes over the last year

## 2014-10-01 ENCOUNTER — Ambulatory Visit (INDEPENDENT_AMBULATORY_CARE_PROVIDER_SITE_OTHER): Payer: 59

## 2014-10-01 DIAGNOSIS — Z23 Encounter for immunization: Secondary | ICD-10-CM | POA: Diagnosis not present

## 2014-11-03 ENCOUNTER — Other Ambulatory Visit: Payer: Self-pay | Admitting: Internal Medicine

## 2014-12-02 ENCOUNTER — Other Ambulatory Visit: Payer: Self-pay

## 2014-12-02 MED ORDER — FLECAINIDE ACETATE 150 MG PO TABS: 300.0000 mg | ORAL_TABLET | ORAL | Status: DC | PRN

## 2014-12-22 ENCOUNTER — Ambulatory Visit (INDEPENDENT_AMBULATORY_CARE_PROVIDER_SITE_OTHER): Payer: 59 | Admitting: Internal Medicine

## 2014-12-22 ENCOUNTER — Encounter: Payer: Self-pay | Admitting: Internal Medicine

## 2014-12-22 VITALS — BP 122/66 | HR 67 | Temp 97.4°F | Ht 68.0 in | Wt 286.1 lb

## 2014-12-22 DIAGNOSIS — I10 Essential (primary) hypertension: Secondary | ICD-10-CM

## 2014-12-22 DIAGNOSIS — K589 Irritable bowel syndrome without diarrhea: Secondary | ICD-10-CM

## 2014-12-22 DIAGNOSIS — E785 Hyperlipidemia, unspecified: Secondary | ICD-10-CM | POA: Diagnosis not present

## 2014-12-22 DIAGNOSIS — Z09 Encounter for follow-up examination after completed treatment for conditions other than malignant neoplasm: Secondary | ICD-10-CM

## 2014-12-22 DIAGNOSIS — R7303 Prediabetes: Secondary | ICD-10-CM | POA: Diagnosis not present

## 2014-12-22 NOTE — Progress Notes (Signed)
Pre visit review using our clinic review tool, if applicable. No additional management support is needed unless otherwise documented below in the visit note. 

## 2014-12-22 NOTE — Patient Instructions (Signed)
Before you leave the office:  Go to the front desk: Please schedule labs to be done within few days (fasting)  Schedule a complete physical exam to be done in 3 or 4 months  Please be fasting

## 2014-12-22 NOTE — Progress Notes (Signed)
Subjective:    Patient ID: Veronica Bradford, female    DOB: [birth year], 69 y.o.   MRN: [id number]  DOS:  12/22/2014 Type of visit - description : Routine office visit Interval history: A fibrillation: Rarely has symptoms IBS: Ongoing problem with diarrhea, symptoms decreased with Lomotil and Tylenol No. 3 as needed Tinnitus for about 4 months; reports  a long history of HOH, was checked 6 months ago, worse on the right. HTN: Ambulatory BPs in the 120s.    Review of Systems No chest pain or difficulty breathing No nausea vomiting  Past Medical History  Diagnosis Date  . Hypertension   . Hyperlipemia   . GERD (gastroesophageal reflux disease)   . PAF (paroxysmal atrial fibrillation) ([place])   . Prediabetes   . Chronic sinusitis     s/p 2 surgeries remotely, [name]  . Endometrial cancer ([place])     found during hysterectomy, no chemo,XRT  . Back pain   . GERD (gastroesophageal reflux disease)   . Sleep apnea 09/09/2013    Dx with OSA in 2015, by Dr. Caryl Comes, APAP  . IBS (irritable bowel syndrome) 08/13/2014    Dx 2015, diarrhea on-off, Dr [name]   . Ectopic pregnancy   . Osteopenia   . Mild depression     Pt's son has Down's Syndrome  . Sciatica of right side 08/2013    Received Depomedrol 80 mg injection  . Lumbar radiculopathy 08/2012    MRI in 08/2012  . Endometrial cancer ([place]) 2004    Early diagnosis  . Mild hyperparathyroidism [facility])     Past Surgical History  Procedure Laterality Date  . Total knee arthroplasty Right ~2008  . Abdominal hysterectomy      had B oophorectomy  . Tonsillectomy    . Cholecystectomy  ~2006  . Nasal sinus surgery      x 2 remotely, [name]  . Finger surgery      Left index  . Esophagogastroduodenoscopy  02/09/2014    Dr. [name]    Social History   Social History  . Marital Status: Married    Spouse Name: N/A  . Number of Children: 1  . Years of Education: N/A   Occupational History  . RN-ICU    Social History Main Topics    . Smoking status: Former Smoker    Quit date: 01/06/1980  . Smokeless tobacco: Never Used  . Alcohol Use: 2.4 oz/week    4 Glasses of wine per week     Comment: glass of wine 4 nights a week  . Drug Use: No  . Sexual Activity: Not on file   Other Topics Concern  . Not on file   Social History Narrative        Medication List       This list is accurate as of: 12/22/14 11:59 PM.  Always use your most recent med list.               acetaminophen-codeine 300-30 MG tablet  Commonly known as:  TYLENOL #3  Take 1 tablet by mouth daily as needed for moderate pain.     CALCIUM CITRATE + PO  Take by mouth.     cetirizine 10 MG tablet  Commonly known as:  ZYRTEC  Take 10 mg by mouth daily. Reported on 12/22/2014     cholecalciferol 1000 UNITS tablet  Commonly known as:  VITAMIN D  Take 2,000 Units by mouth every other day.  ciprofloxacin 500 MG tablet  Commonly known as:  CIPRO  Take 1 tablet (500 mg total) by mouth 2 (two) times daily.     Co Q 10 10 MG Caps  Take 100 mg by mouth daily.     Fish Oil 1200 MG Caps  Take 1,200 mg by mouth daily.     flecainide 150 MG tablet  Commonly known as:  TAMBOCOR  Take 2 tablets (300 mg total) by mouth as needed (for paroxysmal atrial fibrillation).     fluticasone 50 MCG/ACT nasal spray  Commonly known as:  FLONASE  Place 2 sprays into both nostrils daily.     furosemide 20 MG tablet  Commonly known as:  LASIX  Take 20 mg by mouth daily as needed for fluid.     metoprolol tartrate 12.5 mg Tabs tablet  Commonly known as:  LOPRESSOR  Take 0.5 tablets (12.5 mg total) by mouth 2 (two) times daily. Take addition 0.5 tabs if have palpitation     MULTIVITAMIN PO  Take 1 tablet by mouth daily.     oxymetazoline 0.05 % nasal spray  Commonly known as:  AFRIN  Place 1 spray into both nostrils at bedtime.     pantoprazole 40 MG tablet  Commonly known as:  PROTONIX  Take 40 mg by mouth daily.     ranitidine 150 MG  capsule  Commonly known as:  ZANTAC  Take 150 mg by mouth daily as needed for heartburn.     simvastatin 80 MG tablet  Commonly known as:  ZOCOR  Take 40 mg by mouth at bedtime.     valsartan-hydrochlorothiazide 320-25 MG tablet  Commonly known as:  DIOVAN-HCT  Take 1 tablet by mouth daily.     XARELTO 20 MG Tabs tablet  Generic drug:  rivaroxaban  TAKE 1 TABLET (20 MG) BY MOUTH DAILY WITH SUPPER.           Objective:   Physical Exam BP 122/66 mmHg  Pulse 67  Temp(Src) 97.4 F (36.3 C) (Oral)  Ht 5\' 8"  (1.727 m)  Wt 286 lb 2 oz (129.785 kg)  BMI 43.52 kg/m2  SpO2 97% General:   Well developed, well nourished . NAD.  HEENT:  Normocephalic . Face symmetric, atraumatic Lungs:  CTA B Normal respiratory effort, no intercostal retractions, no accessory muscle use. Heart: RRR,  ? Murmur right of the sternum  +/+++ pretibial edema bilaterally  Skin: Not pale. Not jaundice Neurologic:  alert & oriented X3.  Speech normal, gait appropriate for age and unassisted Psych--  Cognition and judgment appear intact.  Cooperative with normal attention span and concentration.  Behavior appropriate. No anxious or depressed appearing.      Assessment & Plan:   Assessment: Prediabetes HTN Hyperlipidemia Chronic lower extremity edema (eval by previous pcp, related to varicose veins?) Paroxysmal atrial fibrillation GI: Dr [name]  --GERD --IBS, chronic diarrhea --GI records: [place], 2004, 2009 8 and 07-2013. Negative for polyps or IBD. + Diverticuli. Next 2020 due to family history. Sleep apnea, CPAP, Dr. Maxwell Caul Chronic back pain, history of epidurals in the past, MRI 2014: Scoliosis,DJD. H/o endometrial cancer, found during a hysterectomy, no chemotherapy or XRT. Pap smear 04-2014 within normal per patient cipro pre dental d/t knee replacement (rx by ortho)  PLAN: Prediabetes: Last A1c satisfactory HTN: Well-controlled, recent BMP normal Hyperlipidemia: Check  FLP IBS, follow-up by Dr. [name] Tinnitus: Recommend observation for now, if increase sx or worsen HOH>>>> will need ENT referral RTC  4  months, CPX

## 2014-12-23 ENCOUNTER — Other Ambulatory Visit (INDEPENDENT_AMBULATORY_CARE_PROVIDER_SITE_OTHER): Payer: 59

## 2014-12-23 DIAGNOSIS — Z09 Encounter for follow-up examination after completed treatment for conditions other than malignant neoplasm: Secondary | ICD-10-CM | POA: Insufficient documentation

## 2014-12-23 DIAGNOSIS — E785 Hyperlipidemia, unspecified: Secondary | ICD-10-CM

## 2014-12-23 LAB — LIPID PANEL
CHOL/HDL RATIO: 3
Cholesterol: 141 mg/dL (ref 0–200)
HDL: 46.1 mg/dL (ref >39.00)
LDL CALC: 78 mg/dL (ref 0–99)
NonHDL: 95.16
TRIGLYCERIDES: 85 mg/dL (ref 0.0–149.0)
VLDL: 17 mg/dL (ref 0.0–40.0)

## 2014-12-23 NOTE — Assessment & Plan Note (Signed)
Prediabetes: Last A1c satisfactory HTN: Well-controlled, recent BMP normal Hyperlipidemia: Check FLP IBS, follow-up by Dr. [name] Tinnitus: Recommend observation for now, if increase sx or worsen HOH>>>> will need ENT referral RTC  4 months, CPX

## 2015-01-15 ENCOUNTER — Other Ambulatory Visit: Payer: Self-pay

## 2015-01-15 MED ORDER — SIMVASTATIN 80 MG PO TABS: 40.0000 mg | ORAL_TABLET | Freq: Every day | ORAL | Status: DC

## 2015-01-27 MED FILL — FLECAINIDE ACETATE 150 MG T: 150 | 5 days supply | Qty: 10 | Fill #2

## 2015-01-27 MED FILL — SIMVASTATIN 80 MG TABLET: 80 | 90 days supply | Qty: 45 | Fill #0

## 2015-01-27 MED FILL — XARELTO 20 MG TABLET: 20 | 90 days supply | Qty: 90 | Fill #3

## 2015-01-27 MED FILL — CLINDAMYCIN HCL 300 MG CAP: 300 | 8 days supply | Qty: 16 | Fill #0

## 2015-02-12 MED FILL — CLINDAMYCIN HCL 150 MG CAP: 150 | 10 days supply | Qty: 30 | Fill #0

## 2015-02-12 MED FILL — HYDROCODON-APAP 10-325: 10-325 | 2 days supply | Qty: 12 | Fill #0

## 2015-03-02 MED FILL — raNITIdine HCL 150 MG TABS: 150 | 30 days supply | Qty: 30 | Fill #0

## 2015-03-02 MED FILL — CLINDAMYCIN HCL 300 MG CAPS: 300 | 8 days supply | Qty: 16 | Fill #1

## 2015-03-02 MED FILL — FLECAINIDE ACETATE 150 MG T: 150 | 5 days supply | Qty: 10 | Fill #3

## 2015-03-12 ENCOUNTER — Telehealth: Payer: Self-pay | Admitting: Internal Medicine

## 2015-03-12 NOTE — Telephone Encounter (Signed)
Relationship to patient: Self  Can be reached: [phone]  Pharmacy: [facility]   Reason for call: Pt called in because she takes simvastatin She says that she switch insurance to [place]. Pt says that it will be cheaper for her to have a 90 day supply of her med. ALSO, pt would like to have 40 mg tablets instead of 80 because pt says that she have a hard time splitting her tablets.

## 2015-03-12 NOTE — Telephone Encounter (Signed)
Okay to change to Simvastatin 40 mg 1 tablet daily instead of Simvastatin 80 mg 1/2 tablet daily?

## 2015-03-15 ENCOUNTER — Other Ambulatory Visit: Payer: Self-pay

## 2015-03-15 MED ORDER — RIVAROXABAN 20 MG PO TABS: 20.0000 mg | ORAL_TABLET | Freq: Every day | ORAL | Status: DC

## 2015-03-15 MED ORDER — SIMVASTATIN 40 MG PO TABS: 40.0000 mg | ORAL_TABLET | Freq: Every day | ORAL | Status: DC

## 2015-03-15 NOTE — Telephone Encounter (Signed)
Okay simvastatin 40 mg 1 tablets daily , Rx 90 days

## 2015-03-15 NOTE — Telephone Encounter (Signed)
Simvastatin 80 mg d/c, Simvastatin 40 mg 1 tablet daily, #90 and 0RF sent to [facility].

## 2015-03-15 NOTE — Telephone Encounter (Signed)
Received fax from [place] requesting transfer of Xarelto Rx. Rx sent.

## 2015-03-16 ENCOUNTER — Other Ambulatory Visit: Payer: Self-pay

## 2015-03-16 MED ORDER — VALSARTAN-HYDROCHLOROTHIAZIDE 320-25 MG PO TABS: 1.0000 | ORAL_TABLET | Freq: Every day | ORAL | Status: DC

## 2015-04-06 ENCOUNTER — Emergency Department (HOSPITAL_COMMUNITY)
Admission: EM | Admit: 2015-04-06 | Discharge: 2015-04-06 | Disposition: A | Discharge status: Home / Self Care | Payer: Commercial Managed Care - HMO | Attending: Emergency Medicine | Admitting: Emergency Medicine

## 2015-04-06 ENCOUNTER — Other Ambulatory Visit: Payer: Self-pay

## 2015-04-06 ENCOUNTER — Encounter (HOSPITAL_COMMUNITY): Payer: Self-pay

## 2015-04-06 DIAGNOSIS — K219 Gastro-esophageal reflux disease without esophagitis: Secondary | ICD-10-CM | POA: Insufficient documentation

## 2015-04-06 DIAGNOSIS — Z880 Allergy status to penicillin: Secondary | ICD-10-CM | POA: Insufficient documentation

## 2015-04-06 DIAGNOSIS — I10 Essential (primary) hypertension: Secondary | ICD-10-CM | POA: Insufficient documentation

## 2015-04-06 DIAGNOSIS — Z8739 Personal history of other diseases of the musculoskeletal system and connective tissue: Secondary | ICD-10-CM | POA: Diagnosis not present

## 2015-04-06 DIAGNOSIS — R002 Palpitations: Secondary | ICD-10-CM | POA: Diagnosis present

## 2015-04-06 DIAGNOSIS — Z7951 Long term (current) use of inhaled steroids: Secondary | ICD-10-CM | POA: Insufficient documentation

## 2015-04-06 DIAGNOSIS — Z7901 Long term (current) use of anticoagulants: Secondary | ICD-10-CM | POA: Diagnosis not present

## 2015-04-06 DIAGNOSIS — Z8541 Personal history of malignant neoplasm of cervix uteri: Secondary | ICD-10-CM | POA: Diagnosis not present

## 2015-04-06 DIAGNOSIS — Z8709 Personal history of other diseases of the respiratory system: Secondary | ICD-10-CM | POA: Insufficient documentation

## 2015-04-06 DIAGNOSIS — E785 Hyperlipidemia, unspecified: Secondary | ICD-10-CM | POA: Diagnosis not present

## 2015-04-06 DIAGNOSIS — Z87891 Personal history of nicotine dependence: Secondary | ICD-10-CM | POA: Insufficient documentation

## 2015-04-06 DIAGNOSIS — Z8669 Personal history of other diseases of the nervous system and sense organs: Secondary | ICD-10-CM | POA: Diagnosis not present

## 2015-04-06 DIAGNOSIS — Z79899 Other long term (current) drug therapy: Secondary | ICD-10-CM | POA: Diagnosis not present

## 2015-04-06 DIAGNOSIS — I480 Paroxysmal atrial fibrillation: Secondary | ICD-10-CM | POA: Insufficient documentation

## 2015-04-06 DIAGNOSIS — I4891 Unspecified atrial fibrillation: Secondary | ICD-10-CM

## 2015-04-06 NOTE — ED Provider Notes (Signed)
CSN: [id number]     Arrival date & time 04/06/15  1122 History   First MD Initiated Contact with Patient 04/06/15 1352     Chief Complaint  Patient presents with  . Atrial Fibrillation     (Consider location/radiation/quality/duration/timing/severity/associated sxs/prior Treatment) HPI   70 year old female palpitations. She has a past history of atrial fibrillation. She reports onset of palpitations around midnight. She took a dose of flecainide but felt like she was still in atrial fibrillation this morning so came to the emergency room. She reports a normal recurrent couple hours. While waiting room she did convert back to a sinus rhythm. She has no further complaints. Denies any chest pain, palpitations, dyspnea, etc.   Past Medical History  Diagnosis Date  . Hypertension   . Hyperlipemia   . GERD (gastroesophageal reflux disease)   . PAF (paroxysmal atrial fibrillation) ([place])   . Prediabetes   . Chronic sinusitis     s/p 2 surgeries remotely, [name]  . Endometrial cancer ([place])     found during hysterectomy, no chemo,XRT  . Back pain   . GERD (gastroesophageal reflux disease)   . Sleep apnea 09/09/2013    Dx with OSA in 2015, by Dr. Caryl Comes, APAP  . IBS (irritable bowel syndrome) 08/13/2014    Dx 2015, diarrhea on-off, Dr [name]   . Ectopic pregnancy   . Osteopenia   . Mild depression     Pt's son has Down's Syndrome  . Sciatica of right side 08/2013    Received Depomedrol 80 mg injection  . Lumbar radiculopathy 08/2012    MRI in 08/2012  . Endometrial cancer ([place]) 2004    Early diagnosis  . Mild hyperparathyroidism [facility])    Past Surgical History  Procedure Laterality Date  . Total knee arthroplasty Right ~2008  . Abdominal hysterectomy      had B oophorectomy  . Tonsillectomy    . Cholecystectomy  ~2006  . Nasal sinus surgery      x 2 remotely, [name]  . Finger surgery      Left index  . Esophagogastroduodenoscopy  02/09/2014    Dr. [name]   Family History   Problem Relation Age of Onset  . Hypertension Mother   . Cancer Mother     ovarian, colon and breast  . Hypertension Father   . Diabetes Father   . Cancer Sister     Head and neck (1/2 sister)   Social History  Substance Use Topics  . Smoking status: Former Smoker    Quit date: 01/06/1980  . Smokeless tobacco: Never Used  . Alcohol Use: 2.4 oz/week    4 Glasses of wine per week     Comment: glass of wine 4 nights a week   OB History    No data available     Review of Systems  All systems reviewed and negative, other than as noted in HPI.   Allergies  Nsaids; Tramadol; Zoster vaccine live; and Amoxicillin  Home Medications   Prior to Admission medications   Medication Sig Start Date End Date Taking? Authorizing Provider  acetaminophen-codeine (TYLENOL #3) 300-30 MG per tablet Take 1 tablet by mouth daily as needed for moderate pain.     Historical Provider, MD  Calcium Citrate-Vitamin D (CALCIUM CITRATE + PO) Take by mouth.    Historical Provider, MD  cetirizine (ZYRTEC) 10 MG tablet Take 10 mg by mouth daily. Reported on 12/22/2014    Historical Provider, MD  cholecalciferol (VITAMIN D)  1000 UNITS tablet Take 2,000 Units by mouth every other day.    Historical Provider, MD  ciprofloxacin (CIPRO) 500 MG tablet Take 1 tablet (500 mg total) by mouth 2 (two) times daily. Patient not taking: Reported on 12/22/2014 08/13/14   Colon Branch, MD  Coenzyme Q10 (CO Q 10) 10 MG CAPS Take 100 mg by mouth daily.     Historical Provider, MD  flecainide (TAMBOCOR) 150 MG tablet Take 2 tablets (300 mg total) by mouth as needed (for paroxysmal atrial fibrillation). Patient not taking: Reported on 12/22/2014 12/02/14   [name], MD  fluticasone [facility]) 50 MCG/ACT nasal spray Place 2 sprays into both nostrils daily.    Historical Provider, MD  furosemide (LASIX) 20 MG tablet Take 20 mg by mouth daily as needed for fluid.    Historical Provider, MD  metoprolol tartrate (LOPRESSOR) 12.5  mg TABS tablet Take 0.5 tablets (12.5 mg total) by mouth 2 (two) times daily. Take addition 0.5 tabs if have palpitation Patient not taking: Reported on 12/22/2014 08/05/13   Almyra Deforest, PA  Multiple Vitamins-Minerals (MULTIVITAMIN PO) Take 1 tablet by mouth daily.    Historical Provider, MD  Omega-3 Fatty Acids (FISH OIL) 1200 MG CAPS Take 1,200 mg by mouth daily.    Historical Provider, MD  oxymetazoline (AFRIN) 0.05 % nasal spray Place 1 spray into both nostrils at bedtime.     Historical Provider, MD  pantoprazole (PROTONIX) 40 MG tablet Take 40 mg by mouth daily.    Historical Provider, MD  ranitidine (ZANTAC) 150 MG capsule Take 150 mg by mouth daily as needed for heartburn.    Historical Provider, MD  rivaroxaban (XARELTO) 20 MG TABS tablet Take 1 tablet (20 mg total) by mouth daily with supper. 03/15/15   [name], MD  simvastatin (ZOCOR) 40 MG tablet Take 1 tablet (40 mg total) by mouth at bedtime. 03/15/15   Colon Branch, MD  valsartan-hydrochlorothiazide (DIOVAN-HCT) 320-25 MG tablet Take 1 tablet by mouth daily. 03/16/15   Colon Branch, MD   BP 151/69 mmHg  Pulse 82  Temp(Src) 98.2 F (36.8 C) (Oral)  Resp 16  Ht 5\' 8"  (1.727 m)  Wt 279 lb 11.2 oz (126.871 kg)  BMI 42.54 kg/m2  SpO2 95% Physical Exam  Constitutional: She appears well-developed and well-nourished. No distress.  HENT:  Head: Normocephalic and atraumatic.  Eyes: Conjunctivae are normal. Right eye exhibits no discharge. Left eye exhibits no discharge.  Neck: Neck supple.  Cardiovascular: Normal rate, regular rhythm and normal heart sounds.  Exam reveals no gallop and no friction rub.   No murmur heard. Pulmonary/Chest: Effort normal and breath sounds normal. No respiratory distress.  Abdominal: Soft. She exhibits no distension. There is no tenderness.  Musculoskeletal: She exhibits no edema or tenderness.  Neurological: She is alert.  Skin: Skin is warm and dry.  Psychiatric: She has a normal mood and affect. Her  behavior is normal. Thought content normal.  Nursing note and vitals reviewed.   ED Course  Procedures (including critical care time) Labs Review Labs Reviewed - No data to display  Imaging Review No results found. I have personally reviewed and evaluated these images and lab results as part of my medical decision-making.   EKG Interpretation   Date/Time:  Tuesday April 06 2015 13:58:43 EDT Ventricular Rate:  64 PR Interval:  206 QRS Duration: 150 QT Interval:  441 QTC Calculation: 455 R Axis:   -41 Text Interpretation:  Sinus rhythm  Nonspecific IVCD with LAD LVH with  secondary repolarization abnormality Probable lateral infarct, age  indeterminate Anterior Q waves, possibly due to LVH ED PHYSICIAN  INTERPRETATION AVAILABLE IN [place] Confirmed by TEST, Record  ([id number]) on 04/07/2015 6:57:15 AM      MDM   Final diagnoses:  Atrial fibrillation, unspecified type ([place])    69yF with afib. Hx of same. Took flecainide last night and converted while in waiting room. Symptoms now completely resolved. She is on xarelto and has established cardiology care. I do not feel that further ED w/u is necessary. Pt is comfortable with discharge. Return precautions discussed.     Virgel Manifold, MD 04/12/15 1438

## 2015-04-06 NOTE — ED Notes (Signed)
Pt advised Amy, RN advised pt would be considered AMA if left now.  Pt will stay and see provider, pt requested  no xray or lab work until then.

## 2015-04-06 NOTE — ED Notes (Signed)
Pt reports onset last night started going into Afib took Flecanide and has not converted yet  No chest pain, shortness of breath.

## 2015-04-06 NOTE — Discharge Instructions (Signed)
Atrial Fibrillation °Atrial fibrillation is a type of heartbeat that is irregular or fast (rapid). If you have this condition, your heart keeps quivering in a weird (chaotic) way. This condition can make it so your heart cannot pump blood normally. Having this condition gives a person more risk for stroke, heart failure, and other heart problems. There are different types of atrial fibrillation. Talk with your doctor to learn about the type that you have. °HOME CARE °· Take over-the-counter and prescription medicines only as told by your doctor. °· If your doctor prescribed a blood-thinning medicine, take it exactly as told. Taking too much of it can cause bleeding. If you do not take enough of it, you will not have the protection that you need against stroke and other problems. °· Do not use any tobacco products. These include cigarettes, chewing tobacco, and e-cigarettes. If you need help quitting, ask your doctor. °· If you have apnea (obstructive sleep apnea), manage it as told by your doctor. °· Do not drink alcohol. °· Do not drink beverages that have caffeine. These include coffee, soda, and tea. °· Maintain a healthy weight. Do not use diet pills unless your doctor says they are safe for you. Diet pills may make heart problems worse. °· Follow diet instructions as told by your doctor. °· Exercise regularly as told by your doctor. °· Keep all follow-up visits as told by your doctor. This is important. °GET HELP IF: °· You notice a change in the speed, rhythm, or strength of your heartbeat. °· You are taking a blood-thinning medicine and you notice more bruising. °· You get tired more easily when you move or exercise. °GET HELP RIGHT AWAY IF: °· You have pain in your chest or your belly (abdomen). °· You have sweating or weakness. °· You feel sick to your stomach (nauseous). °· You notice blood in your throw up (vomit), poop (stool), or pee (urine). °· You are short of breath. °· You suddenly have swollen feet  and ankles. °· You feel dizzy. °· Your suddenly get weak or numb in your face, arms, or legs, especially if it happens on one side of your body. °· You have trouble talking, trouble understanding, or both. °· Your face or your eyelid droops on one side. °These symptoms may be an emergency. Do not wait to see if the symptoms will go away. Get medical help right away. Call your local emergency services (911 in the [place] Do not drive yourself to the hospital. °  °This information is not intended to replace advice given to you by your health care provider. Make sure you discuss any questions you have with your health care provider. °  °Document Released: 09/28/2007 Document Revised: 09/09/2014 Document Reviewed: 04/15/2014 °Elsevier Interactive Patient Education ©2016 [employer] ° °

## 2015-04-06 NOTE — ED Notes (Signed)
Pt verbalized understanding of d/c instructions, prescriptions, and follow-up care. No further questions/concerns, VSS, ambulatory w/ steady gait (refused wheelchair) 

## 2015-04-06 NOTE — ED Notes (Signed)
Pt reports has converted out of Afib.  Advised to let nurse first Amy, RN to know.

## 2015-04-06 NOTE — ED Notes (Signed)
Pt requesting blood to be obtained at time of IV insertion.

## 2015-04-14 ENCOUNTER — Encounter: Payer: Self-pay | Admitting: Internal Medicine

## 2015-04-14 ENCOUNTER — Ambulatory Visit (INDEPENDENT_AMBULATORY_CARE_PROVIDER_SITE_OTHER): Payer: Commercial Managed Care - HMO | Admitting: Internal Medicine

## 2015-04-14 VITALS — BP 126/56 | HR 58 | Temp 97.8°F | Ht 68.0 in | Wt 278.5 lb

## 2015-04-14 DIAGNOSIS — Z0001 Encounter for general adult medical examination with abnormal findings: Secondary | ICD-10-CM | POA: Diagnosis not present

## 2015-04-14 DIAGNOSIS — I10 Essential (primary) hypertension: Secondary | ICD-10-CM | POA: Diagnosis not present

## 2015-04-14 DIAGNOSIS — H9313 Tinnitus, bilateral: Secondary | ICD-10-CM | POA: Diagnosis not present

## 2015-04-14 DIAGNOSIS — Z124 Encounter for screening for malignant neoplasm of cervix: Secondary | ICD-10-CM

## 2015-04-14 DIAGNOSIS — E785 Hyperlipidemia, unspecified: Secondary | ICD-10-CM | POA: Diagnosis not present

## 2015-04-14 DIAGNOSIS — Z0000 Encounter for general adult medical examination without abnormal findings: Secondary | ICD-10-CM | POA: Diagnosis not present

## 2015-04-14 DIAGNOSIS — I480 Paroxysmal atrial fibrillation: Secondary | ICD-10-CM

## 2015-04-14 DIAGNOSIS — Z01419 Encounter for gynecological examination (general) (routine) without abnormal findings: Secondary | ICD-10-CM

## 2015-04-14 MED ORDER — ACETAMINOPHEN-CODEINE #3 300-30 MG PO TABS: 1.0000 | ORAL_TABLET | Freq: Every day | ORAL | Status: DC | PRN

## 2015-04-14 NOTE — Patient Instructions (Signed)
GO TO THE LAB :      Get the blood work     GO TO THE FRONT DESK Schedule your next appointment for a  routine checkup in 6 months, no fasting   The American Heart Association website has very good ideas about eating healthy

## 2015-04-14 NOTE — Assessment & Plan Note (Signed)
HTN: Continue Diovan HCT, labs Hyperlipidemia: Continue simvastatin, labs Chronic lower extremity edema: Venous insufficiency? Discussed  radiology referral for further eval, will think about it. A. fib, paroxysmal, most recent episode 3 weeks ago. Now asymptomatic Osteopenia: Last T score normal 2015, reassess next year Tinnitus: Three-month history also, refer to ENT. RTC 6 months

## 2015-04-14 NOTE — Progress Notes (Signed)
Pre visit review using our clinic review tool, if applicable. No additional management support is needed unless otherwise documented below in the visit note. 

## 2015-04-14 NOTE — Progress Notes (Signed)
Subjective:    Patient ID: Veronica Bradford, female    DOB: 11/12/1945, 70 y.o.   MRN: [id number]  DOS:  04/14/2015 Type of visit - description : CPX Interval history: Martin Majestic to the ER 04-06-2015 with episodes of atrial fibrillation, took flecainide, resolved spontaneously a few hours later. We also address her chronic medical problems, and a new problem (tinnitus)  Review of Systems  Constitutional: No fever. No chills. No unexplained wt changes. No unusual sweats  HEENT: No dental problems, no ear discharge, no facial swelling, no voice changes. No eye discharge, no eye  redness , no  intolerance to light   Respiratory: No wheezing , no  difficulty breathing. No cough , no mucus production  Cardiovascular: No CP  Leg swelling, chronic for about 2 years, worse when sitting down, decrease with activity. Somewhat concerned because swelling seems to be more prominent. Worse on the left. GI: no nausea, no vomiting, no diarrhea , no  abdominal pain.  No blood in the stools. No dysphagia, no odynophagia    Endocrine: No polyphagia, no polyuria , no polydipsia  GU: No dysuria, gross hematuria, difficulty urinating. No urinary urgency, no frequency.  Musculoskeletal: No joint swellings or unusual aches or pains  Skin: No change in the color of the skin, palor , no  Rash  Allergic, immunologic: No environmental allergies , no  food allergies  Neurological: No dizziness no  syncope. No headaches. No diplopia, no slurred, no slurred speech, no motor deficits, no facial  Numbness + Tinnitus for 3 months, getting worse, it is constant, no history of nose exposure, mild hearing decrease.  Hematological: No enlarged lymph nodes, no easy bruising , no unusual bleedings  Psychiatry: No suicidal ideas, no hallucinations, no beavior problems, no confusion.  No unusual/severe anxiety, no depression  Past Medical History  Diagnosis Date  . Hypertension   . Hyperlipemia   . GERD (gastroesophageal  reflux disease)   . PAF (paroxysmal atrial fibrillation) ([place])   . Prediabetes   . Chronic sinusitis     s/p 2 surgeries remotely, [name]  . Endometrial cancer ([place])     found during hysterectomy, no chemo,XRT  . Back pain   . GERD (gastroesophageal reflux disease)   . Sleep apnea 09/09/2013    Dx with OSA in 2015, by Dr. Caryl Comes, APAP  . IBS (irritable bowel syndrome) 08/13/2014    Dx 2015, diarrhea on-off, Dr [name]   . Ectopic pregnancy   . Osteopenia   . Mild depression     Pt's son has Down's Syndrome  . Sciatica of right side 08/2013    Received Depomedrol 80 mg injection  . Lumbar radiculopathy 08/2012    MRI in 08/2012  . Endometrial cancer ([place]) 2004    Early diagnosis  . Mild hyperparathyroidism [facility])     Past Surgical History  Procedure Laterality Date  . Total knee arthroplasty Right ~2008  . Abdominal hysterectomy      had B oophorectomy  . Tonsillectomy    . Cholecystectomy  ~2006  . Nasal sinus surgery      x 2 remotely, [name]  . Finger surgery      Left index  . Esophagogastroduodenoscopy  02/09/2014    Dr. [name]    Social History   Social History  . Marital Status: Married    Spouse Name: N/A  . Number of Children: 1  . Years of Education: N/A   Occupational History  . 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 Smoking status: Former Smoker    Quit date: 01/06/1980  . Smokeless tobacco: Never Used  . Alcohol Use: 2.4 oz/week    4 Glasses of wine per week     Comment: glass of wine 4 nights a week  . Drug Use: No  . Sexual Activity: Not on file   Other Topics Concern  . Not on file   Social History Narrative   Lives w/ husband, and son [name]     Family History  Problem Relation Age of Onset  . Hypertension Mother   . Cancer Mother     ovarian   . Hypertension Father   . Diabetes Father   . Cancer Sister     Head and neck (1/2 sister)  . Colon cancer Mother   . Breast cancer Mother   . CAD Neg Hx             Medication List       This list is accurate as of: 04/14/15  7:27 PM.  Always use your most recent med list.               acetaminophen-codeine 300-30 MG tablet  Commonly known as:  TYLENOL #3  Take 1 tablet by mouth daily as needed for moderate pain.     CALCIUM CITRATE + PO  Take by mouth.     cetirizine 10 MG tablet  Commonly known as:  ZYRTEC  Take 10 mg by mouth daily. Reported on 04/14/2015     cholecalciferol 1000 units tablet  Commonly known as:  VITAMIN D  Take 2,000 Units by mouth every other day.     ciprofloxacin 500 MG tablet  Commonly known as:  CIPRO  Take 1 tablet (500 mg total) by mouth 2 (two) times daily.     Co Q 10 10 MG Caps  Take 100 mg by mouth daily.     diphenoxylate-atropine 2.5-0.025 MG tablet  Commonly known as:  LOMOTIL  Take 2 tablets by mouth as needed for diarrhea or loose stools.     Fish Oil 1200 MG Caps  Take 1,200 mg by mouth daily.     flecainide 150 MG tablet  Commonly known as:  TAMBOCOR  Take 2 tablets (300 mg total) by mouth as needed (for paroxysmal atrial fibrillation).     fluticasone 50 MCG/ACT nasal spray  Commonly known as:  FLONASE  Place 2 sprays into both nostrils daily.     furosemide 20 MG tablet  Commonly known as:  LASIX  Take 20 mg by mouth daily as needed for fluid.     MULTIVITAMIN PO  Take 1 tablet by mouth daily.     oxymetazoline 0.05 % nasal spray  Commonly known as:  AFRIN  Place 1 spray into both nostrils at bedtime. Reported on 04/14/2015     pantoprazole 40 MG tablet  Commonly known as:  PROTONIX  Take 20 mg by mouth daily.     ranitidine 150 MG capsule  Commonly known as:  ZANTAC  Take 150 mg by mouth daily as needed for heartburn.     rivaroxaban 20 MG Tabs tablet  Commonly known as:  XARELTO  Take 1 tablet (20 mg total) by mouth daily with supper.     simvastatin 40 MG tablet  Commonly known as:  ZOCOR  Take 1 tablet (40 mg total) by mouth at bedtime.      valsartan-hydrochlorothiazide 320-25 MG  tablet  Commonly known as:  DIOVAN-HCT  Take 1 tablet by mouth daily.           Objective:   Physical Exam BP 126/56 mmHg  Pulse 58  Temp(Src) 97.8 F (36.6 C) (Oral)  Ht 5\' 8"  (1.727 m)  Wt 278 lb 8 oz (126.327 kg)  BMI 42.36 kg/m2  SpO2 95%  General:   Well developed, well nourished . NAD.  Neck: No  thyromegaly , normal carotid pulse, no bruit. HEENT:  Normocephalic . Face symmetric, atraumatic. TMs normal Lungs:  CTA B Normal respiratory effort, no intercostal retractions, no accessory muscle use. Heart: RRR,  no murmur.  Lower extremities: Left leg is slightly larger in circumference just above the ankle, at the calf level they are almost the same size + varicose veins without phlebitis changes Normal pedal pulses bilaterally Abdomen:  Not distended, soft, non-tender. No rebound or rigidity.   Skin: Exposed areas without rash. Not pale. Not jaundice Neurologic:  alert & oriented X3.  Speech normal, gait appropriate for age and unassisted Strength symmetric and appropriate for age.  Psych: Cognition and judgment appear intact.  Cooperative with normal attention span and concentration.  Behavior appropriate. No anxious or depressed appearing.    Assessment & Plan:    Assessment: Prediabetes HTN Hyperlipidemia Chronic lower extremity edema L>R (eval by previous pcp, related to varicose veins?) Paroxysmal atrial fibrillation GI: Dr [name]  --GERD --IBS, chronic diarrhea --GI records: [place], 2004, 2009 8 and 07-2013. Negative for polyps or IBD. + Diverticuli. Next 2020 due to [place] OSA--- CPAP, Dr. Maxwell Caul Osteopenia:  Multiple  Dexas, last two:  T score 2009  -1.1 T score 2015  -0.7 DJD --Chronic back pain-- tylenol #3 prn --- RF per PCP --history of epidurals in the past, MRI 2014: Scoliosis,DJD --CIPRO pre dental d/t knee replacement (rx by ortho) H/o endometrial cancer, found during a hysterectomy, no  chemotherapy or XRT. Pap smear 04-2014 within normal per patient   PLAN: HTN: Continue Diovan HCT, labs Hyperlipidemia: Continue simvastatin, labs Chronic lower extremity edema: Venous insufficiency? Discussed  radiology referral for further eval, will think about it. A. fib, paroxysmal, most recent episode 3 weeks ago. Now asymptomatic Osteopenia: Last T score normal 2015, reassess next year Tinnitus: Three-month history also, refer to ENT. RTC 6 months   In addition to CPX multiple other issues discussed, time spent  > >40 min

## 2015-04-14 NOTE — Assessment & Plan Note (Addendum)
Td 2013; pnm shot 2015, prevnar 2015; zostavax 2013 Female care: refer to gyn  PAP 04-2014 MMG 2015, patient will call and schedule a follow-up mammogram  Colonoscopy 2015: Normal per office visit notes from [place] and exercise discussed

## 2015-04-15 ENCOUNTER — Other Ambulatory Visit: Payer: Self-pay

## 2015-04-15 DIAGNOSIS — Z1231 Encounter for screening mammogram for malignant neoplasm of breast: Secondary | ICD-10-CM

## 2015-04-15 LAB — LIPID PANEL
CHOLESTEROL: 154 mg/dL (ref 0–200)
HDL: 57.2 mg/dL (ref >39.00)
LDL Cholesterol: 78 mg/dL (ref 0–99)
NONHDL: 96.45
TRIGLYCERIDES: 94 mg/dL (ref 0.0–149.0)
Total CHOL/HDL Ratio: 3
VLDL: 18.8 mg/dL (ref 0.0–40.0)

## 2015-04-15 LAB — COMPREHENSIVE METABOLIC PANEL
ALBUMIN: 4.1 g/dL (ref 3.5–5.2)
ALK PHOS: 68 U/L (ref 39–117)
ALT: 23 U/L (ref 0–35)
AST: 24 U/L (ref 0–37)
BILIRUBIN TOTAL: 0.9 mg/dL (ref 0.2–1.2)
BUN: 15 mg/dL (ref 6–23)
CALCIUM: 10.6 mg/dL — AB (ref 8.4–10.5)
CO2: 30 mEq/L (ref 19–32)
Chloride: 105 mEq/L (ref 96–112)
Creatinine, Ser: 0.7 mg/dL (ref 0.40–1.20)
GFR: 88.63 mL/min (ref >60.00)
Glucose, Bld: 79 mg/dL (ref 70–99)
POTASSIUM: 4.2 meq/L (ref 3.5–5.1)
Sodium: 142 mEq/L (ref 135–145)
TOTAL PROTEIN: 7 g/dL (ref 6.0–8.3)

## 2015-04-15 LAB — CBC WITH DIFFERENTIAL/PLATELET
BASOS PCT: 0.5 % (ref 0.0–3.0)
Basophils Absolute: 0 10*3/uL (ref 0.0–0.1)
EOS PCT: 2.7 % (ref 0.0–5.0)
Eosinophils Absolute: 0.2 10*3/uL (ref 0.0–0.7)
HCT: 41.6 % (ref 36.0–46.0)
Hemoglobin: 13.8 g/dL (ref 12.0–15.0)
LYMPHS ABS: 1.9 10*3/uL (ref 0.7–4.0)
Lymphocytes Relative: 26.4 % (ref 12.0–46.0)
MCHC: 33.2 g/dL (ref 30.0–36.0)
MCV: 86 fl (ref 78.0–100.0)
MONO ABS: 0.5 10*3/uL (ref 0.1–1.0)
MONOS PCT: 6.7 % (ref 3.0–12.0)
NEUTROS ABS: 4.7 10*3/uL (ref 1.4–7.7)
NEUTROS PCT: 63.7 % (ref 43.0–77.0)
Platelets: 213 10*3/uL (ref 150.0–400.0)
RBC: 4.83 Mil/uL (ref 3.87–5.11)
RDW: 15.5 % (ref 11.5–15.5)
WBC: 7.4 10*3/uL (ref 4.0–10.5)

## 2015-04-15 LAB — TSH: TSH: 1.03 u[IU]/mL (ref 0.35–4.50)

## 2015-04-20 MED FILL — MUPIROCIN 2% OINTMENT: 2 | 8 days supply | Qty: 22 | Fill #0

## 2015-04-20 MED FILL — CEPHALEXIN 500 MG CAPSULE: 500 | 5 days supply | Qty: 15 | Fill #0

## 2015-04-20 MED FILL — ACETAMINOPHEN/COD #3 TABLET: 300-30 | 30 days supply | Qty: 30 | Fill #0

## 2015-04-28 ENCOUNTER — Ambulatory Visit
Admission: RE | Admit: 2015-04-28 | Discharge: 2015-04-28 | Disposition: A | Discharge status: Home / Self Care | Payer: Commercial Managed Care - HMO | Source: Ambulatory Visit

## 2015-04-28 DIAGNOSIS — Z1231 Encounter for screening mammogram for malignant neoplasm of breast: Secondary | ICD-10-CM

## 2015-04-29 ENCOUNTER — Ambulatory Visit (INDEPENDENT_AMBULATORY_CARE_PROVIDER_SITE_OTHER): Payer: Commercial Managed Care - HMO | Admitting: Obstetrics & Gynecology

## 2015-04-29 ENCOUNTER — Other Ambulatory Visit (HOSPITAL_COMMUNITY)
Admission: RE | Admit: 2015-04-29 | Discharge: 2015-04-29 | Disposition: A | Discharge status: Home / Self Care | Payer: Commercial Managed Care - HMO | Source: Ambulatory Visit | Attending: Obstetrics & Gynecology | Admitting: Obstetrics & Gynecology

## 2015-04-29 ENCOUNTER — Encounter: Payer: Self-pay | Admitting: Obstetrics & Gynecology

## 2015-04-29 VITALS — BP 122/51 | HR 66 | Resp 16 | Ht 68.0 in | Wt 279.0 lb

## 2015-04-29 DIAGNOSIS — Z1151 Encounter for screening for human papillomavirus (HPV): Secondary | ICD-10-CM

## 2015-04-29 DIAGNOSIS — Z01419 Encounter for gynecological examination (general) (routine) without abnormal findings: Secondary | ICD-10-CM | POA: Insufficient documentation

## 2015-04-29 DIAGNOSIS — Z124 Encounter for screening for malignant neoplasm of cervix: Secondary | ICD-10-CM | POA: Diagnosis not present

## 2015-04-29 DIAGNOSIS — Z8542 Personal history of malignant neoplasm of other parts of uterus: Secondary | ICD-10-CM | POA: Diagnosis not present

## 2015-04-29 NOTE — Progress Notes (Signed)
Subjective:    Veronica Bradford is a 70 y.o. MW female who presents for an annual exam. The patient has no complaints today. The patient is not currently sexually active. GYN screening history: last pap: was normal. The patient wears seatbelts: yes. The patient participates in regular exercise: no. Has the patient ever been transfused or tattooed?: no. The patient reports that there is not domestic violence in her life.   Menstrual History: OB History    Gravida Para Term Preterm AB TAB SAB Ectopic Multiple Living   4 1 1  3  3   13        No LMP recorded. Patient has had a hysterectomy.    The following portions of the patient's history were reviewed and updated as appropriate: allergies, current medications, past family history, past medical history, past social history, past surgical history and problem list.  Review of Systems Pertinent items are noted in HPI. Married for 40+ years, no longer sexually active, retired Personnel officer, Her cardiologist has done an echocardiogram and did not hear her murmur (as Did Dr. [name])   Objective:    BP 122/51 mmHg  Pulse 66  Resp 16  Ht 5\' 8"  (1.727 m)  Wt 279 lb (126.554 kg)  BMI 42.43 kg/m2  General Appearance:    Alert, cooperative, no distress, appears stated age  Head:    Normocephalic, without obvious abnormality, atraumatic  Eyes:    PERRL, conjunctiva/corneas clear, EOM's intact, fundi    benign, both eyes  Ears:    Normal TM's and external ear canals, both ears  Nose:   Nares normal, septum midline, mucosa normal, no drainage    or sinus tenderness  Throat:   Lips, mucosa, and tongue normal; teeth and gums normal  Neck:   Supple, symmetrical, trachea midline, no adenopathy;    thyroid:  no enlargement/tenderness/nodules; no carotid   bruit or JVD  Back:     Symmetric, no curvature, ROM normal, no CVA tenderness  Lungs:     Clear to auscultation bilaterally, respirations unlabored  Chest Wall:    No tenderness or deformity   Heart:     Regular rate and rhythm, S1 and S2 normal, 3/6 holosystolic murmur  Breast Exam:    No tenderness, masses, or nipple abnormality  Abdomen:     Soft, non-tender, bowel sounds active all four quadrants,    no masses, no organomegaly  Genitalia:    Normal female without lesion, discharge or tenderness, normal cuff and no palpable pelvic masses     Extremities:   Extremities normal, atraumatic, no cyanosis or edema  Pulses:   2+ and symmetric all extremities  Skin:   Skin color, texture, turgor normal, no rashes or lesions  Lymph nodes:   Cervical, supraclavicular, and axillary nodes normal  Neurologic:   CNII-XII intact, normal strength, sensation and reflexes    throughout  .    Assessment:    Healthy female exam.    Plan:     Thin prep Pap smear. with cotesting (I am waiting for a call back from Dr. Denman George to see how long she will need pap smears)

## 2015-04-30 LAB — CYTOLOGY - PAP

## 2015-05-19 ENCOUNTER — Other Ambulatory Visit: Payer: Self-pay | Admitting: Internal Medicine

## 2015-06-03 ENCOUNTER — Ambulatory Visit (HOSPITAL_BASED_OUTPATIENT_CLINIC_OR_DEPARTMENT_OTHER)
Admission: RE | Admit: 2015-06-03 | Discharge: 2015-06-03 | Disposition: A | Discharge status: Home / Self Care | Payer: Commercial Managed Care - HMO | Source: Ambulatory Visit | Attending: Internal Medicine | Admitting: Internal Medicine

## 2015-06-03 ENCOUNTER — Encounter: Payer: Self-pay | Admitting: Internal Medicine

## 2015-06-03 ENCOUNTER — Ambulatory Visit (INDEPENDENT_AMBULATORY_CARE_PROVIDER_SITE_OTHER): Payer: Commercial Managed Care - HMO | Admitting: Internal Medicine

## 2015-06-03 ENCOUNTER — Telehealth: Payer: Self-pay | Admitting: Internal Medicine

## 2015-06-03 VITALS — BP 124/72 | HR 65 | Temp 97.6°F | Ht 68.0 in | Wt 279.4 lb

## 2015-06-03 DIAGNOSIS — S92352A Displaced fracture of fifth metatarsal bone, left foot, initial encounter for closed fracture: Secondary | ICD-10-CM | POA: Diagnosis not present

## 2015-06-03 DIAGNOSIS — M25572 Pain in left ankle and joints of left foot: Secondary | ICD-10-CM

## 2015-06-03 DIAGNOSIS — S92302A Fracture of unspecified metatarsal bone(s), left foot, initial encounter for closed fracture: Secondary | ICD-10-CM

## 2015-06-03 DIAGNOSIS — X58XXXA Exposure to other specified factors, initial encounter: Secondary | ICD-10-CM | POA: Diagnosis not present

## 2015-06-03 NOTE — Assessment & Plan Note (Signed)
Left foot injury and pain: Rule out fracture, get x-ray. If x-ray is +, refer to [place]. If (-) conservative treatment with ice, foot elevation. Has a boot , tried  to use it but feels more comfortable with wide shoes RTC 10-2015 as schedule

## 2015-06-03 NOTE — Progress Notes (Signed)
Subjective:    Patient ID: Veronica Bradford, female    DOB: 1945/11/10, 70 y.o.   MRN: [id number]  DOS:  06/03/2015 Type of visit - description : acute Interval history: About a week ago she was walking on uneven ground,rolled outward her left foot, immediately developed pain and swelling at the dorsum of the foot. Has been using ice and leg elevation. swelling slightly decreased, pain is still there, range from 4/10 to 7/10 On Tylenol with codeine as needed   Review of Systems Mild chronic ankle swelling at baseline, no ankle pain per se No other injury but her chronic back pain is slightly worse, she thinks because her gait is uneven d/t foot injury.  Past Medical History  Diagnosis Date  . Hypertension   . Hyperlipemia   . GERD (gastroesophageal reflux disease)   . PAF (paroxysmal atrial fibrillation) ([place])   . Prediabetes   . Chronic sinusitis     s/p 2 surgeries remotely, [name]  . Endometrial cancer ([place])     found during hysterectomy, no chemo,XRT  . Back pain   . GERD (gastroesophageal reflux disease)   . Sleep apnea 09/09/2013    Dx with OSA in 2015, by Dr. Caryl Comes, APAP  . IBS (irritable bowel syndrome) 08/13/2014    Dx 2015, diarrhea on-off, Dr [name]   . Ectopic pregnancy   . Osteopenia   . Mild depression     Pt's son has Down's Syndrome  . Sciatica of right side 08/2013    Received Depomedrol 80 mg injection  . Lumbar radiculopathy 08/2012    MRI in 08/2012  . Endometrial cancer ([place]) 2004    Early diagnosis  . Mild hyperparathyroidism [facility])     Past Surgical History  Procedure Laterality Date  . Total knee arthroplasty Right ~2008  . Abdominal hysterectomy      had B oophorectomy  . Tonsillectomy    . Cholecystectomy  ~2006  . Nasal sinus surgery      x 2 remotely, [name]  . Finger surgery      Left index  . Esophagogastroduodenoscopy  02/09/2014    Dr. [name]  . Dilation and curettage of uterus      after SAB    Social History   Social  History  . Marital Status: Married    Spouse Name: N/A  . Number of Children: 1  . Years of Education: N/A   Occupational History  . 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 Smoking status: Former Smoker    Quit date: 01/06/1980  . Smokeless tobacco: Never Used  . Alcohol Use: 2.4 oz/week    4 Glasses of wine per week     Comment: glass of wine 4 nights a week  . Drug Use: No  . Sexual Activity: No   Other Topics Concern  . Not on file   Social History Narrative   Lives w/ husband, and son [name]        Medication List       This list is accurate as of: 06/03/15  1:00 PM.  Always use your most recent med list.               acetaminophen-codeine 300-30 MG tablet  Commonly known as:  TYLENOL #3  Take 1 tablet by mouth daily as needed for moderate pain.     CALCIUM CITRATE + PO  Take by mouth.     cetirizine 10 MG  tablet  Commonly known as:  ZYRTEC  Take 10 mg by mouth daily. Reported on 04/14/2015     cholecalciferol 1000 units tablet  Commonly known as:  VITAMIN D  Take 2,000 Units by mouth every other day.     ciprofloxacin 500 MG tablet  Commonly known as:  CIPRO  Take 1 tablet (500 mg total) by mouth 2 (two) times daily.     Co Q 10 10 MG Caps  Take 100 mg by mouth daily.     diphenoxylate-atropine 2.5-0.025 MG tablet  Commonly known as:  LOMOTIL  Take 2 tablets by mouth as needed for diarrhea or loose stools.     Fish Oil 1200 MG Caps  Take 1,200 mg by mouth daily.     flecainide 150 MG tablet  Commonly known as:  TAMBOCOR  Take 2 tablets (300 mg total) by mouth as needed (for paroxysmal atrial fibrillation).     fluticasone 50 MCG/ACT nasal spray  Commonly known as:  FLONASE  Place 2 sprays into both nostrils daily.     furosemide 20 MG tablet  Commonly known as:  LASIX  Take 20 mg by mouth daily as needed for fluid.     MULTIVITAMIN PO  Take 1 tablet by mouth daily.     oxymetazoline 0.05 % nasal spray  Commonly known  as:  AFRIN  Place 1 spray into both nostrils at bedtime. Reported on 06/03/2015     pantoprazole 40 MG tablet  Commonly known as:  PROTONIX  Take 20 mg by mouth daily.     ranitidine 150 MG capsule  Commonly known as:  ZANTAC  Take 150 mg by mouth daily as needed for heartburn.     rivaroxaban 20 MG Tabs tablet  Commonly known as:  XARELTO  Take 1 tablet (20 mg total) by mouth daily with supper.     simvastatin 40 MG tablet  Commonly known as:  ZOCOR  Take 1 tablet (40 mg total) by mouth at bedtime.     valsartan-hydrochlorothiazide 320-25 MG tablet  Commonly known as:  DIOVAN-HCT  Take 1 tablet by mouth daily.           Objective:   Physical Exam BP 124/72 mmHg  Pulse 65  Temp(Src) 97.6 F (36.4 C) (Oral)  Ht 5\' 8"  (1.727 m)  Wt 279 lb 6 oz (126.724 kg)  BMI 42.49 kg/m2  SpO2 98% General:   Well developed, well nourished . NAD.  HEENT:  Normocephalic . Face symmetric, atraumatic MSK: Left ankle no TTP and full range of motion. Dorsum of the left foot is slightly swollen, slightly TTP proximal from the fourth toe. No deformities. Good left pedal pulse. Skin: Not pale. Not jaundice Neurologic:  alert & oriented X3.  Speech normal, gait appropriate for age and unassisted Psych--  Cognition and judgment appear intact.  Cooperative with normal attention span and concentration.  Behavior appropriate. No anxious or depressed appearing.      Assessment & Plan:   Assessment: Prediabetes HTN Hyperlipidemia Chronic lower extremity edema L>R (eval by previous pcp, related to varicose veins?) Paroxysmal atrial fibrillation GI: Dr [name]  --GERD --IBS, chronic diarrhea --GI records: [place], 2004, 2009 8 and 07-2013. Negative for polyps or IBD. + Diverticuli. Next 2020 due to [place] OSA--- CPAP, Dr. Maxwell Caul Osteopenia:  Multiple  Dexas, last two:  T score 2009  -1.1 T score 2015  -0.7 DJD --Chronic back pain-- tylenol #3 prn --- RF per PCP --history  of  epidurals in the past, MRI 2014: Scoliosis,DJD --CIPRO pre dental d/t knee replacement (rx by ortho) H/o endometrial cancer, found during a hysterectomy, no chemotherapy or XRT. Pap smear 04-2014 within normal per patient   PLAN: Left foot injury and pain: Rule out fracture, get x-ray. If x-ray is +, refer to [place]. If (-) conservative treatment with ice, foot elevation. Has a boot , tried  to use it but feels more comfortable with wide shoes RTC 10-2015 as schedule

## 2015-06-03 NOTE — Patient Instructions (Signed)
Get your x-rays downstairs  Continue Tylenol with codeine or Tylenol by itself to help pain  ICE  Leg elevation  Call if not gradually improving

## 2015-06-03 NOTE — Telephone Encounter (Signed)
Advise patient, she has a fracture at the fifth metatarsal. Arrange a referral to ortho.   There is a subacute mildly displaced spiral fracture through the distal third of the shaft of the fifth metatarsal. No other fractures are observed.

## 2015-06-03 NOTE — Telephone Encounter (Signed)
Spoke w/ Pt, informed her of x-ray results, instructed her to use boot if possible until she see's Ortho. Pt requesting to be seen at [place] since she is an already established Pt there. Referral placed. Informed her to expect a call from their office or ours to schedule an appt. Pt verbalized understanding.

## 2015-06-03 NOTE — Telephone Encounter (Signed)
Please advise 

## 2015-06-03 NOTE — Progress Notes (Signed)
Pre visit review using our clinic review tool, if applicable. No additional management support is needed unless otherwise documented below in the visit note. 

## 2015-06-03 NOTE — Telephone Encounter (Signed)
Relation to [id number] Call back number:[phone]   Reason for call:  Patient inquiring about imaging results patient was advised results would be back today.

## 2015-06-04 ENCOUNTER — Encounter: Payer: Self-pay | Admitting: Internal Medicine

## 2015-06-07 ENCOUNTER — Ambulatory Visit: Payer: Commercial Managed Care - HMO | Admitting: Family Medicine

## 2015-07-02 ENCOUNTER — Telehealth: Payer: Self-pay | Admitting: Internal Medicine

## 2015-07-02 MED ORDER — FLECAINIDE ACETATE 150 MG PO TABS: 300.0000 mg | ORAL_TABLET | ORAL | Status: DC | PRN

## 2015-07-02 MED FILL — FLECAINIDE ACETATE 150 MG T: 150 | 30 days supply | Qty: 30 | Fill #0

## 2015-07-02 NOTE — Telephone Encounter (Signed)
New message    The pt was wanting to speak with a nurse no other information provided

## 2015-07-02 NOTE — Telephone Encounter (Signed)
I spoke with Dr. Caryl Comes. Reviewed my conversation below with the patient. He states that the patient should go ahead and take an additional 300 mg of flecainide now and if she does not convert to NSR within 12-14 hours, then she should report to the ER at [facility] for the a-fib protocol in place there. I have notified the patient of Dr. Olin Pia recommendations and she is agreeable and voices understanding.

## 2015-07-02 NOTE — Telephone Encounter (Signed)
I called and spoke with the patient. She has a history of PAF.  She takes xarelto 20 mg once daily and flecainide 300 mg as needed for breakthrough. She reports several episodes of a-fib over the last few 2-3 months (4/4, 4/28, 5/15, 5/30- all lasting about 2-4 hours). She states today that she went in to a-fib about 2 am this morning and took flecainide 300 mg. She had copious amounts of vomiting about 15 minutes later. She waited until about 3 am and took an additional 150 mg of flecainide. She is currently still in a-fib with HR's 100-110. She has had no recurrent nausea/ vomiting/ denies any other cardiac symptoms at this time. She is not on any other medications for rate control- previously on metoprolol, but she states she did not feel well at all taking this. She has not missed any does of xarelto.  I advised her I will speak with Dr. Caryl Comes and call her back. She is agreeble.

## 2015-07-24 ENCOUNTER — Encounter: Payer: Self-pay | Admitting: Nurse Practitioner

## 2015-07-26 ENCOUNTER — Ambulatory Visit (INDEPENDENT_AMBULATORY_CARE_PROVIDER_SITE_OTHER): Payer: Commercial Managed Care - HMO | Admitting: Family

## 2015-07-26 ENCOUNTER — Telehealth: Payer: Self-pay | Admitting: Internal Medicine

## 2015-07-26 VITALS — BP 140/72 | HR 63 | Temp 98.1°F | Resp 16 | Ht 68.0 in | Wt 272.8 lb

## 2015-07-26 DIAGNOSIS — I4891 Unspecified atrial fibrillation: Secondary | ICD-10-CM

## 2015-07-26 DIAGNOSIS — I480 Paroxysmal atrial fibrillation: Secondary | ICD-10-CM | POA: Diagnosis not present

## 2015-07-26 DIAGNOSIS — R002 Palpitations: Secondary | ICD-10-CM

## 2015-07-26 DIAGNOSIS — I10 Essential (primary) hypertension: Secondary | ICD-10-CM | POA: Diagnosis not present

## 2015-07-26 NOTE — Assessment & Plan Note (Signed)
Was in NSR in the ER and rhythm is regular today on exam.  She is encouraged to keep her appointment with cardiology tomorrow as schedueld.

## 2015-07-26 NOTE — Progress Notes (Signed)
Subjective:    Patient ID: Veronica Bradford, female    DOB: [birth year], 70 y.o.   MRN: [id number]  HPI  Veronica Bradford is a 70 yr old female with history of hypertension, PAF (on flecanide PRN) and xarelto, who presents today with complaint of recent "pvc's." She brings with her today her lab work and EKG from her most recent ED visit.   BP Readings from Last 3 Encounters:  07/26/15 140/72  06/03/15 124/72  04/29/15 (!) 122/51   Friday 7/22 she reports that she has a pulsitile tinnitus which woke her up.  Felt that she was having "pvcs" about 0-4/minute. Reports that she had hx of pvc's about 20 yrs ago diagnosed by Dr. Melvern Banker. She reports that she spoke to Dr. Caryl Comes (she was in [place] at the time). She went to [facility] hospital ER. Reports that she was hydrated.  She felt sleepy for 2 days, had a HA.  Reports that she drove home with her husband. On Sunday AM, She took an imitrex which she had on hand.  Took a nap and when she woke up, her bp was improved. Reports bp 180/80 initially when she had it checked in [place].  She is scheduled to see Franco Collet NP tomorrow (cardiology).   She denies CP or SOB. Reports that her asymmetric LE edema is about at baseline. Perhaps slightly worse due to recent long car travel.   Review of Systems See HPI Past Medical History:  Diagnosis Date  . Back pain   . Chronic sinusitis    s/p 2 surgeries remotely, [name]  . Closed fracture of metatarsal of left foot    L foot, fifth metatarsal  . Ectopic pregnancy   . Endometrial cancer ([place])    found during hysterectomy, no chemo,XRT  . Endometrial cancer ([place]) 2004   Early diagnosis  . GERD (gastroesophageal reflux disease)   . GERD (gastroesophageal reflux disease)   . Hyperlipemia   . Hypertension   . IBS (irritable bowel syndrome) 08/13/2014   Dx 2015, diarrhea on-off, Dr [name]   . Lumbar radiculopathy 08/2012   MRI in 08/2012  . Mild depression    Pt's son has Down's Syndrome  . Mild  hyperparathyroidism ([place])   . Osteopenia   . PAF (paroxysmal atrial fibrillation) ([place])   . Prediabetes   . Sciatica of right side 08/2013   Received Depomedrol 80 mg injection  . Sleep apnea 09/09/2013   Dx with OSA in 2015, by Dr. Caryl Comes, APAP     Social History   Social History  . Marital status: Married    Spouse name: N/A  . Number of children: 1  . Years of education: N/A   Occupational History  . 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 Smoking status: Former Smoker    Quit date: 01/06/1980  . Smokeless tobacco: Never Used  . Alcohol use 2.4 oz/week    4 Glasses of wine per week     Comment: glass of wine 4 nights a week  . Drug use: No  . Sexual activity: No   Other Topics Concern  . Not on file   Social History Narrative   Lives w/ husband, and son Veronica Bradford    Past Surgical History:  Procedure Laterality Date  . ABDOMINAL HYSTERECTOMY     had B oophorectomy  . CHOLECYSTECTOMY  ~2006  . DILATION AND CURETTAGE OF UTERUS     after SAB  .  ESOPHAGOGASTRODUODENOSCOPY  02/09/2014   Dr. [name]  . FINGER SURGERY     Left index  . NASAL SINUS SURGERY     x 2 remotely, [name]  . TONSILLECTOMY    . TOTAL KNEE ARTHROPLASTY Right ~2008    Family History  Problem Relation Age of Onset  . Hypertension Mother   . Cancer Mother     ovarian   . Colon cancer Mother   . Breast cancer Mother   . Hypertension Father   . Diabetes Father   . Cancer Sister     Head and neck (1/2 sister)  . CAD Neg Hx     Allergies  Allergen Reactions  . Nsaids Other (See Comments)    Pt on Xarelto  . Tolmetin Other (See Comments)    Pt on Xarelto  . Tramadol Swelling  . Zoster Vaccine Live Other (See Comments)    Sickness per Pt.   Marland Kitchen Amoxicillin Rash    Current Outpatient Prescriptions on File Prior to Visit  Medication Sig Dispense Refill  . acetaminophen-codeine (TYLENOL #3) 300-30 MG tablet Take 1 tablet by mouth daily as needed for moderate pain. 30 tablet 0   . Calcium Citrate-Vitamin D (CALCIUM CITRATE + PO) Take by mouth.    . cetirizine (ZYRTEC) 10 MG tablet Take 10 mg by mouth daily. Reported on 04/14/2015    . cholecalciferol (VITAMIN D) 1000 UNITS tablet Take 2,000 Units by mouth every other day.    . ciprofloxacin (CIPRO) 500 MG tablet Take 1 tablet (500 mg total) by mouth 2 (two) times daily. 6 tablet 0  . Coenzyme Q10 (CO Q 10) 10 MG CAPS Take 100 mg by mouth daily.     . diphenoxylate-atropine (LOMOTIL) 2.5-0.025 MG tablet Take 2 tablets by mouth as needed for diarrhea or loose stools.    . flecainide (TAMBOCOR) 150 MG tablet Take 2 tablets (300 mg total) by mouth as needed (for paroxysmal atrial fibrillation). 30 tablet 1  . fluticasone (FLONASE) 50 MCG/ACT nasal spray Place 2 sprays into both nostrils daily.    . furosemide (LASIX) 20 MG tablet Take 20 mg by mouth daily as needed for fluid.    . Multiple Vitamins-Minerals (MULTIVITAMIN PO) Take 1 tablet by mouth daily.    . Omega-3 Fatty Acids (FISH OIL) 1200 MG CAPS Take 1,200 mg by mouth daily.    . ranitidine (ZANTAC) 150 MG capsule Take 150 mg by mouth daily as needed for heartburn.    . rivaroxaban (XARELTO) 20 MG TABS tablet Take 1 tablet (20 mg total) by mouth daily with supper. 90 tablet 1  . simvastatin (ZOCOR) 40 MG tablet Take 1 tablet (40 mg total) by mouth at bedtime. 90 tablet 1  . valsartan-hydrochlorothiazide (DIOVAN-HCT) 320-25 MG tablet Take 1 tablet by mouth daily. 90 tablet 1   No current facility-administered medications on file prior to visit.     BP 140/72   Pulse 63   Temp 98.1 F (36.7 C)   Resp 16   Ht 5\' 8"  (1.727 m)   Wt 272 lb 12.8 oz (123.7 kg)   SpO2 97%   BMI 41.48 kg/m        Objective:   Physical Exam  Constitutional: She appears well-developed and well-nourished.  Cardiovascular: Normal rate, regular rhythm and normal heart sounds.   No murmur heard. Pulmonary/Chest: Effort normal and breath sounds normal. No respiratory distress. She  has no wheezes.  Musculoskeletal:  L ankle/pedal edema 2-3+ R ankle/pedal edema  1-2+   Psychiatric: She has a normal mood and affect. Her behavior is normal. Judgment and thought content normal.          Assessment & Plan:

## 2015-07-26 NOTE — Telephone Encounter (Signed)
Pt called in to request a referral to see her cardiologist Warnell Forester. She says that she was seen at the hospital for cardiac concern and was advised to fu with cardiologist.

## 2015-07-26 NOTE — Telephone Encounter (Signed)
I called and spoke with the patient. She reports that she was on vacation with her family over the last week at [facility]. On Friday, she started to have "new onset" PVC's per her report.  She reports having about 4/ min at the time. She tried to stay hydrated and stay replete of K+/ magnesium. They were scheduled to drive home, but she became concerned when the PVC's didn't subside. She went to an Urgent [place] that sent her to [facility]. She was monitored for about 4 hours/ given IVF. 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 She called Dr. Caryl Comes over the weekend and he advised that the ER draw a troponin level on her- she states a troponin/ BMP/ TSH were all drawn and WNL.  Her SBP was running 150-180 over the weekend during these events.  She was instructed to f/u with her cardiologist regarding her PVC's when she returned home. She is still having them today (about 1-2/ min) intermittently. She is going to follow up with her PCP today about her hypertension.  She is requesting follow up with our office as well for PVC's. Appointment scheduled 7/25 with Truitt Merle, NP at 9:30 am. She is agreeable.

## 2015-07-26 NOTE — Assessment & Plan Note (Signed)
BP looks good today, continue current meds.

## 2015-07-26 NOTE — Telephone Encounter (Signed)
New message     The pt was seen in [place] While on vacation, and wants to be seen earlier than August 2 nd with a PA, she states she was seen in the ER cause of PVC's.

## 2015-07-26 NOTE — Patient Instructions (Signed)
Please keep your upcoming appointment with cardiology.   

## 2015-07-26 NOTE — Telephone Encounter (Signed)
Pt is an established cardio Pt, seeing Dr. Caryl Comes. Will place referral.

## 2015-07-26 NOTE — Progress Notes (Signed)
Pre visit review using our clinic review tool, if applicable. No additional management support is needed unless otherwise documented below in the visit note. 

## 2015-07-27 ENCOUNTER — Ambulatory Visit (INDEPENDENT_AMBULATORY_CARE_PROVIDER_SITE_OTHER): Payer: Commercial Managed Care - HMO | Admitting: Nurse Practitioner

## 2015-07-27 ENCOUNTER — Encounter: Payer: Self-pay | Admitting: Nurse Practitioner

## 2015-07-27 VITALS — BP 128/64 | HR 67 | Ht 68.0 in | Wt 273.4 lb

## 2015-07-27 DIAGNOSIS — I10 Essential (primary) hypertension: Secondary | ICD-10-CM | POA: Diagnosis not present

## 2015-07-27 DIAGNOSIS — I480 Paroxysmal atrial fibrillation: Secondary | ICD-10-CM | POA: Diagnosis not present

## 2015-07-27 DIAGNOSIS — Z7901 Long term (current) use of anticoagulants: Secondary | ICD-10-CM | POA: Diagnosis not present

## 2015-07-27 DIAGNOSIS — I119 Hypertensive heart disease without heart failure: Secondary | ICD-10-CM | POA: Diagnosis not present

## 2015-07-27 NOTE — Patient Instructions (Addendum)
We will be checking the following labs today - NONE   Medication Instructions:    Continue with your current medicines.     Testing/Procedures To Be Arranged:  Echocardiogram  Follow-Up:   See Dr. Caryl Comes back as planned unless your echo is abnormal.     Other Special Instructions:   Think about what we talked about today.   Keep a check on your blood pressure  Limit salt    If you need a refill on your cardiac medications before your next appointment, please call your pharmacy.   Call the [place] office at [phone] if you have any questions, problems or concerns.

## 2015-07-27 NOTE — Progress Notes (Signed)
CARDIOLOGY OFFICE NOTE  Date:  07/27/2015    Veronica Bradford Date of Birth: 09-12-1945 Medical Record [id number]  PCP:  Veronica November, MD  Cardiologist:  Veronica Bradford    Chief Complaint  Patient presents with  . Palpitations  . Atrial Fibrillation    Work in visit - seen for Dr. Caryl Bradford    History of Present Illness: Veronica Bradford is a 70 y.o. female who presents today for a work in visit. Seen for Dr. Caryl Bradford.   She has a history of PAF, GERD, prediabetes, depression and OSA. She is on chronic Flecainide as well as Xarelto. Did not tolerate Eliquis.   Last seen back in September. Felt to be doing ok.   Phone call yesterday - "The pt was seen in [place] While on vacation, and wants to be seen earlier than August 2 nd with a PA, she states she was seen in the ER cause of PVC's"  Thus added to my schedule for today.    Bradford in today. Here with her husband. Saw PCP yesterday. Brought her records. Was in [place] for vacation. On Friday, woke up with pulsatile tinnitus - feels like this is her PVC equivalent. Noted compensatory pauses. Tried to drink more fluids and maintain hydration. No chest pain and not short of breath. Felt like she "was not in the game" and "fuzzy". No passing out. No alcohol for a week prior. No real caffeine. Probably got more salt due to eating out. This persisted into the next day - she was concerned. Husband called Dr. Caryl Bradford - advised to go to Urgent Care for EKG and labs. She went to Urgent Care but was not satisfied with what they could do for her - referred on to the ER - negative labs and negative troponin. Was hydrated with IVF. BP was elevated at both places. This was associated with a headache for which she took Imitrex and BP meds early - this seemed to help. Saw her PCP yesterday for BP - BP was improved and she was to monitor at home. Now back to normal.  No atrial fib. Feels like she continues to have PVCs - but less - only using Flecainide prn - last spell  towards the end of June for which she called. She is now pretty much back to normal. Fairly sedentary. Had stopped checking her BP at home. Her labs were normal. EKG with LBBB - no PVCs or AF noted.  Past Medical History:  Diagnosis Date  . Back pain   . Chronic sinusitis    s/p 2 surgeries remotely, [name]  . Closed fracture of metatarsal of left foot    L foot, fifth metatarsal  . Ectopic pregnancy   . Endometrial cancer ([place])    found during hysterectomy, no chemo,XRT  . Endometrial cancer ([place]) 2004   Early diagnosis  . GERD (gastroesophageal reflux disease)   . GERD (gastroesophageal reflux disease)   . Hyperlipemia   . Hypertension   . IBS (irritable bowel syndrome) 08/13/2014   Dx 2015, diarrhea on-off, Dr [name]   . Lumbar radiculopathy 08/2012   MRI in 08/2012  . Mild depression    Pt's son has Down's Syndrome  . Mild hyperparathyroidism ([place])   . Osteopenia   . PAF (paroxysmal atrial fibrillation) ([place])   . Prediabetes   . Sciatica of right side 08/2013   Received Depomedrol 80 mg injection  . Sleep apnea 09/09/2013   Dx with OSA in 2015,  by Dr. Caryl Bradford, APAP    Past Surgical History:  Procedure Laterality Date  . ABDOMINAL HYSTERECTOMY     had B oophorectomy  . CHOLECYSTECTOMY  ~2006  . DILATION AND CURETTAGE OF UTERUS     after SAB  . ESOPHAGOGASTRODUODENOSCOPY  02/09/2014   Dr. [name]  . FINGER SURGERY     Left index  . NASAL SINUS SURGERY     x 2 remotely, [name]  . TONSILLECTOMY    . TOTAL KNEE ARTHROPLASTY Right ~2008     Medications: Current Outpatient Prescriptions  Medication Sig Dispense Refill  . acetaminophen-codeine (TYLENOL #3) 300-30 MG tablet Take 1 tablet by mouth daily as needed for moderate pain. 30 tablet 0  . Alum Hydroxide-Mag Carbonate (GAVISCON PO) Take 1-2 tablets by mouth as needed (indigestion).    . Calcium Citrate-Vitamin D (CALCIUM CITRATE + PO) Take by mouth.    . cetirizine (ZYRTEC) 10 MG tablet Take 10 mg by mouth as  needed for allergies.    . cholecalciferol (VITAMIN D) 1000 UNITS tablet Take 2,000 Units by mouth every other day.    . ciprofloxacin (CIPRO) 500 MG tablet Take 500 mg by mouth as needed (prior to dental work).    . Coenzyme Q10 (CO Q 10) 10 MG CAPS Take 100 mg by mouth daily.     . diphenoxylate-atropine (LOMOTIL) 2.5-0.025 MG tablet Take 2 tablets by mouth as needed for diarrhea or loose stools.    . flecainide (TAMBOCOR) 150 MG tablet Take 2 tablets (300 mg total) by mouth as needed (for paroxysmal atrial fibrillation). 30 tablet 1  . fluticasone (FLONASE) 50 MCG/ACT nasal spray Place into both nostrils as needed for allergies or rhinitis.    . furosemide (LASIX) 20 MG tablet Take 20 mg by mouth daily as needed for fluid.    . Multiple Vitamins-Minerals (MULTIVITAMIN PO) Take 1 tablet by mouth daily.    . Omega-3 Fatty Acids (FISH OIL) 1200 MG CAPS Take 1,200 mg by mouth daily.    . ranitidine (ZANTAC) 150 MG capsule Take 150 mg by mouth daily as needed for heartburn.    . rivaroxaban (XARELTO) 20 MG TABS tablet Take 1 tablet (20 mg total) by mouth daily with supper. 90 tablet 1  . simvastatin (ZOCOR) 40 MG tablet Take 1 tablet (40 mg total) by mouth at bedtime. 90 tablet 1  . valsartan-hydrochlorothiazide (DIOVAN-HCT) 320-25 MG tablet Take 1 tablet by mouth daily. 90 tablet 1   No current facility-administered medications for this visit.     Allergies: Allergies  Allergen Reactions  . Nsaids Other (See Comments)    Pt on Xarelto  . Tolmetin Other (See Comments)    Pt on Xarelto  . Tramadol Swelling  . Zoster Vaccine Live Other (See Comments)    Sickness per Pt.   Marland Kitchen Amoxicillin Rash    Social History: The patient  reports that she quit smoking about 35 years ago. She has never used smokeless tobacco. She reports that she drinks about 2.4 oz of alcohol per week . She reports that she does not use drugs.   Family History: The patient's family history includes Breast cancer in her  mother; Cancer in her mother and sister; Colon cancer in her mother; Diabetes in her father; Hypertension in her father and mother.   Review of Systems: Please see the history of present illness.   Otherwise, the review of systems is positive for none.   All other systems are reviewed and negative.  Physical Exam: VS:  BP 128/64 (BP Location: Left Arm, Patient Position: Sitting, Cuff Size: Large) Comment: left/ right 132/74  Pulse 67   Ht 5\' 8"  (1.727 m)   Wt 273 lb 6.4 oz (124 kg)   SpO2 97% Comment: at rest  BMI 41.57 kg/m  .  BMI Body mass index is 41.57 kg/m.  Wt Readings from Last 3 Encounters:  07/27/15 273 lb 6.4 oz (124 kg)  07/26/15 272 lb 12.8 oz (123.7 kg)  06/03/15 279 lb 6 oz (126.7 kg)    General: Pleasant. Obese female who is alert and in no acute distress.   HEENT: Normal.  Neck: Supple, no JVD, carotid bruits, or masses noted.  Cardiac: Regular rate and rhythm. No murmurs, rubs, or gallops. No edema.  Respiratory:  Lungs are clear to auscultation bilaterally with normal work of breathing.  GI: Obese. Soft and nontender.  MS: No deformity or atrophy. Gait and ROM intact.  Skin: Warm and dry. Color is normal.  Neuro:  Strength and sensation are intact and no gross focal deficits noted.  Psych: Alert, appropriate and with normal affect.   LABORATORY DATA:  EKG:  EKG is ordered today. This demonstrates NSR with LBBB.  Lab Results  Component Value Date   WBC 7.4 04/14/2015   HGB 13.8 04/14/2015   HCT 41.6 04/14/2015   PLT 213.0 04/14/2015   GLUCOSE 79 04/14/2015   CHOL 154 04/14/2015   TRIG 94.0 04/14/2015   HDL 57.20 04/14/2015   LDLCALC 78 04/14/2015   ALT 23 04/14/2015   AST 24 04/14/2015   NA 142 04/14/2015   K 4.2 04/14/2015   CL 105 04/14/2015   CREATININE 0.70 04/14/2015   BUN 15 04/14/2015   CO2 30 04/14/2015   TSH 1.03 04/14/2015   INR 2.5 (H) 11/08/2006   HGBA1C 5.7 08/13/2014    BNP (last 3 results) No results for input(s): BNP in  the last 8760 hours.  ProBNP (last 3 results) No results for input(s): PROBNP in the last 8760 hours.   Other Studies Reviewed Today:  Echo Study Conclusions from 06/2013  - Left ventricle: The cavity size was normal. Wall thickness was increased in a pattern of moderate LVH. Systolic function was normal. Wall motion was normal; there were no regional wall motion abnormalities. Features are consistent with a pseudonormal left ventricular filling pattern, with concomitant abnormal relaxation and increased filling pressure (grade 2 diastolic dysfunction). - Left atrium: The atrium was mildly dilated. Anterior-posterior dimension: 42 mm.  Assessment/Plan:  1. PVCs - not really symptomatic - I think this was just more of a concern for her - will get her echo updated - if recurs - would place Holter monitor. For now, no change in medicines.   2. PAF - maintained on chronic anticoagulation therapy with prn Flecainide  3. Chronic anticoagulation - recent CBC ok. No problems noted.  4. Obesity - discussed at length.   Current medicines are reviewed with the patient today.  The patient does not have concerns regarding medicines other than what has been noted above.  The following changes have been made:  See above.  Labs/ tests ordered today include:    Orders Placed This Encounter  Procedures  . ECHOCARDIOGRAM COMPLETE     Disposition:   FU with Dr. Caryl Bradford as planned.    Patient is agreeable to this plan and will call if any problems develop in the interim.   Signed: Burtis Junes, RN, ANP-C 07/27/2015 10:17 AM  [facility] [address] [place], [place]  [address] Phone: [phone] Fax: [phone]

## 2015-08-10 ENCOUNTER — Other Ambulatory Visit: Payer: Self-pay

## 2015-08-10 ENCOUNTER — Ambulatory Visit (HOSPITAL_COMMUNITY): Payer: Commercial Managed Care - HMO | Attending: Cardiology

## 2015-08-10 DIAGNOSIS — I119 Hypertensive heart disease without heart failure: Secondary | ICD-10-CM

## 2015-08-10 DIAGNOSIS — Z6841 Body Mass Index (BMI) 40.0 and over, adult: Secondary | ICD-10-CM | POA: Diagnosis not present

## 2015-08-10 DIAGNOSIS — I110 Hypertensive heart disease with heart failure: Secondary | ICD-10-CM | POA: Insufficient documentation

## 2015-08-10 DIAGNOSIS — I071 Rheumatic tricuspid insufficiency: Secondary | ICD-10-CM | POA: Insufficient documentation

## 2015-08-10 DIAGNOSIS — I509 Heart failure, unspecified: Secondary | ICD-10-CM | POA: Insufficient documentation

## 2015-08-10 DIAGNOSIS — Z7901 Long term (current) use of anticoagulants: Secondary | ICD-10-CM | POA: Diagnosis not present

## 2015-08-10 DIAGNOSIS — E785 Hyperlipidemia, unspecified: Secondary | ICD-10-CM | POA: Diagnosis not present

## 2015-08-10 DIAGNOSIS — I10 Essential (primary) hypertension: Secondary | ICD-10-CM

## 2015-08-10 DIAGNOSIS — I480 Paroxysmal atrial fibrillation: Secondary | ICD-10-CM | POA: Insufficient documentation

## 2015-08-10 DIAGNOSIS — E669 Obesity, unspecified: Secondary | ICD-10-CM | POA: Diagnosis not present

## 2015-08-23 ENCOUNTER — Other Ambulatory Visit: Payer: Self-pay | Admitting: Internal Medicine

## 2015-08-25 ENCOUNTER — Telehealth: Payer: Self-pay | Admitting: Internal Medicine

## 2015-08-25 NOTE — Telephone Encounter (Signed)
Was sent in yesterday as below.  Medication Detail    Disp Refills Start End   rivaroxaban (XARELTO) 20 MG TABS tablet 90 tablet 3 08/24/2015    Sig - Route: Take 1 tablet (20 mg total) by mouth daily with supper. - Oral   E-Prescribing Status: Receipt confirmed by pharmacy (08/24/2015 2:27 PM EDT)   Pharmacy   [facility], [place]

## 2015-08-25 NOTE — Telephone Encounter (Signed)
Pt requesting Xarelto refill to [facility] mail order-90 days

## 2015-08-30 ENCOUNTER — Encounter: Payer: Self-pay | Admitting: Internal Medicine

## 2015-09-21 ENCOUNTER — Ambulatory Visit: Payer: Commercial Managed Care - HMO | Admitting: Internal Medicine

## 2015-09-23 ENCOUNTER — Encounter: Payer: Self-pay | Admitting: Internal Medicine

## 2015-09-23 ENCOUNTER — Ambulatory Visit (INDEPENDENT_AMBULATORY_CARE_PROVIDER_SITE_OTHER): Payer: Commercial Managed Care - HMO | Admitting: Internal Medicine

## 2015-09-23 VITALS — BP 124/68 | HR 62 | Ht 68.0 in | Wt 265.2 lb

## 2015-09-23 DIAGNOSIS — I493 Ventricular premature depolarization: Secondary | ICD-10-CM | POA: Diagnosis not present

## 2015-09-23 DIAGNOSIS — I480 Paroxysmal atrial fibrillation: Secondary | ICD-10-CM | POA: Diagnosis not present

## 2015-09-23 MED ORDER — PROPAFENONE HCL 225 MG PO TABS: ORAL_TABLET | ORAL | 2 refills | Status: DC

## 2015-09-23 NOTE — Progress Notes (Signed)
Patient Care Team: Colon Branch, MD as PCP - General (Internal Medicine) Marius Ditch, MD as Consulting Physician (Internal Medicine) [name], MD as Consulting Physician (Cardiology) Wylene Simmer, MD as Consulting Physician (Orthopedic Surgery)   HPI  Veronica Bradford is a 70 y.o. female Seen in followup for AFib   Echo >> normal LV function with LVH  With normal LV  She was seen in the ER 8/15 with recurrent AF   Started on [place]   She has developed diarrhea or following an also occurred with the discontinuation of her proprietary  brand Nexium and the introduction of apixaban. The diarrhea abated with the discontinuation of the apixaban. She is currently on Rivaroxaban  She had atrial fibrillation  2/16 and went to the ER for possible AFib.  receivied flecainide >>> NSR    She has had 2 other episodes of afib each lasting about 3 hrs and terminating with fleciainide this largely still works if she is able to not vomit taking her flecainide. There does not seem to be any particular explanation to the episodic vomiting.    She also recounts that this summer she developed PVCs. I got involved. I'll follow along vacation in [place] in [place].  Otherwise without complaints of sob cp edema or palpitations compliant with CPAP although she struggled with tubing           Past Medical History:  Diagnosis Date  . Back pain   . Chronic sinusitis    s/p 2 surgeries remotely, [name]  . Closed fracture of metatarsal of left foot    L foot, fifth metatarsal  . Ectopic pregnancy   . Endometrial cancer ([place])    found during hysterectomy, no chemo,XRT  . Endometrial cancer ([place]) 2004   Early diagnosis  . GERD (gastroesophageal reflux disease)   . GERD (gastroesophageal reflux disease)   . Hyperlipemia   . Hypertension   . IBS (irritable bowel syndrome) 08/13/2014   Dx 2015, diarrhea on-off, Dr [name]   . Lumbar radiculopathy 08/2012   MRI in 08/2012  . Mild  depression    Pt's son has Down's Syndrome  . Mild hyperparathyroidism ([place])   . Osteopenia   . PAF (paroxysmal atrial fibrillation) ([place])   . Prediabetes   . Sciatica of right side 08/2013   Received Depomedrol 80 mg injection  . Sleep apnea 09/09/2013   Dx with OSA in 2015, by Dr. Caryl Comes, APAP    Past Surgical History:  Procedure Laterality Date  . ABDOMINAL HYSTERECTOMY     had B oophorectomy  . CHOLECYSTECTOMY  ~2006  . DILATION AND CURETTAGE OF UTERUS     after SAB  . ESOPHAGOGASTRODUODENOSCOPY  02/09/2014   Dr. [name]  . FINGER SURGERY     Left index  . NASAL SINUS SURGERY     x 2 remotely, [name]  . TONSILLECTOMY    . TOTAL KNEE ARTHROPLASTY Right ~2008    Current Outpatient Prescriptions  Medication Sig Dispense Refill  . acetaminophen-codeine (TYLENOL #3) 300-30 MG tablet Take 1 tablet by mouth daily as needed for moderate pain. 30 tablet 0  . Alum Hydroxide-Mag Carbonate (GAVISCON PO) Take 1-2 tablets by mouth as needed (indigestion).    . Calcium Citrate-Vitamin D (CALCIUM CITRATE + PO) Take by mouth.    . cetirizine (ZYRTEC) 10 MG tablet Take 10 mg by mouth as needed for allergies.    . cholecalciferol (VITAMIN D) 1000 UNITS  tablet Take 2,000 Units by mouth every other day.    . ciprofloxacin (CIPRO) 500 MG tablet Take 500 mg by mouth as needed (prior to dental work).    . Coenzyme Q10 (CO Q 10) 10 MG CAPS Take 100 mg by mouth daily.     . diphenoxylate-atropine (LOMOTIL) 2.5-0.025 MG tablet Take 2 tablets by mouth as needed for diarrhea or loose stools.    . fluticasone (FLONASE) 50 MCG/ACT nasal spray Place into both nostrils as needed for allergies or rhinitis.    . furosemide (LASIX) 20 MG tablet Take 20 mg by mouth daily as needed for fluid.    . Multiple Vitamins-Minerals (MULTIVITAMIN PO) Take 1 tablet by mouth daily.    . Omega-3 Fatty Acids (FISH OIL) 1200 MG CAPS Take 1,200 mg by mouth daily.    . ranitidine (ZANTAC) 150 MG capsule Take 150 mg by mouth  daily as needed for heartburn.    . rivaroxaban (XARELTO) 20 MG TABS tablet Take 1 tablet (20 mg total) by mouth daily with supper. 90 tablet 3  . simvastatin (ZOCOR) 40 MG tablet Take 1 tablet (40 mg total) by mouth at bedtime. 90 tablet 1  . valsartan-hydrochlorothiazide (DIOVAN-HCT) 320-25 MG tablet Take 1 tablet by mouth daily. 90 tablet 1  . propafenone (RYTHMOL) 225 MG tablet Take 2 tablets by mouth as directed for afib 30 tablet 2   No current facility-administered medications for this visit.     Allergies  Allergen Reactions  . Nsaids Other (See Comments)    Pt on Xarelto  . Tolmetin Other (See Comments)    Pt on Xarelto  . Tramadol Swelling  . Zoster Vaccine Live Other (See Comments)    Sickness per Pt.   Marland Kitchen Amoxicillin Rash    Review of Systems negative except from HPI and PMH  Physical Exam BP 124/68   Pulse 62   Ht 5\' 8"  (1.727 m)   Wt 265 lb 3.2 oz (120.3 kg)   SpO2 98%   BMI 40.32 kg/m  Well developed and well nourished in no acute distress HENT normal E scleral and icterus clear Neck Supple Clear to ausculation  Regular rate and rhythm, no murmurs gallops or rub Soft with active bowel sounds No clubbing cyanosis 1+ Edema Alert and oriented, grossly normal motor and sensory function Skin Warm and Dry  ECG demonstrates sinus rhythm at 60 intervals 19/13/42 IVCD  Assessment and  Plan  Atrial fibrillation-paroxysmal   CHADS-VASc score 2 (age/gender)   OSA well treated  Obesity   We have decided to try propafenone as an alternative to the flecainide.  She will use 450 mg as needed. We will see how it works.  He further discussed strategies sleep apnea therapy

## 2015-09-23 NOTE — Patient Instructions (Addendum)
Medication Instructions:  Your physician has recommended you make the following change in your medication:  1) Stop Flecainide 2) Take Propafenone 225 mg--take 2 tablets by mouth as directed for afib   Labwork: None ordered   Testing/Procedures: None ordered   Follow-Up: Your physician wants you to follow-up in: 6 months with Dr Gari Crown will receive a reminder letter in the mail two months in advance. If you don't receive a letter, please call our office to schedule the follow-up appointment.   Any Other Special Instructions Will Be Listed Below (If Applicable).     If you need a refill on your cardiac medications before your next appointment, please call your pharmacy.

## 2015-09-24 ENCOUNTER — Other Ambulatory Visit: Payer: Self-pay | Admitting: *Deleted

## 2015-09-24 MED ORDER — PROPAFENONE HCL 225 MG PO TABS: ORAL_TABLET | ORAL | 3 refills | Status: DC

## 2015-10-04 ENCOUNTER — Other Ambulatory Visit: Payer: Self-pay | Admitting: Internal Medicine

## 2015-10-19 ENCOUNTER — Encounter: Payer: Self-pay | Admitting: Internal Medicine

## 2015-10-19 ENCOUNTER — Ambulatory Visit (INDEPENDENT_AMBULATORY_CARE_PROVIDER_SITE_OTHER): Payer: Commercial Managed Care - HMO | Admitting: Internal Medicine

## 2015-10-19 VITALS — BP 124/68 | HR 58 | Temp 97.6°F | Resp 14 | Ht 68.0 in | Wt 266.2 lb

## 2015-10-19 DIAGNOSIS — I10 Essential (primary) hypertension: Secondary | ICD-10-CM

## 2015-10-19 DIAGNOSIS — Z23 Encounter for immunization: Secondary | ICD-10-CM

## 2015-10-19 LAB — BASIC METABOLIC PANEL
BUN: 17 mg/dL (ref 6–23)
CALCIUM: 10.9 mg/dL — AB (ref 8.4–10.5)
CO2: 29 meq/L (ref 19–32)
Chloride: 106 mEq/L (ref 96–112)
Creatinine, Ser: 0.69 mg/dL (ref 0.40–1.20)
GFR: 89.97 mL/min (ref >60.00)
Glucose, Bld: 96 mg/dL (ref 70–99)
Potassium: 3.7 mEq/L (ref 3.5–5.1)
SODIUM: 143 meq/L (ref 135–145)

## 2015-10-19 MED ORDER — ACETAMINOPHEN-CODEINE #3 300-30 MG PO TABS: 1.0000 | ORAL_TABLET | Freq: Every day | ORAL | 0 refills | Status: DC | PRN

## 2015-10-19 NOTE — Progress Notes (Signed)
Pre visit review using our clinic review tool, if applicable. No additional management support is needed unless otherwise documented below in the visit note. 

## 2015-10-19 NOTE — Patient Instructions (Signed)
GO TO THE LAB : Get the blood work     GO TO THE FRONT DESK Schedule your next appointment for a  Physical exam by 04-2016

## 2015-10-19 NOTE — Progress Notes (Signed)
Subjective:    Patient ID: Veronica Bradford, female    DOB: 1945/10/02, 70 y.o.   MRN: [id number]  DOS:  10/19/2015 Type of visit - description : Routine checkup Interval history: Feeling great, exercising more (swimming) eating healthier, has lost weight.  Wt Readings from Last 3 Encounters:  10/19/15 266 lb 4 oz (120.8 kg)  09/23/15 265 lb 3.2 oz (120.3 kg)  07/27/15 273 lb 6.4 oz (124 kg)     Review of Systems Denies chest or difficulty breathing Some back pain lately, she thinks related to increasing physical activity  Past Medical History:  Diagnosis Date  . Back pain   . Chronic sinusitis    s/p 2 surgeries remotely, [name]  . Closed fracture of metatarsal of left foot    L foot, fifth metatarsal  . Ectopic pregnancy   . Endometrial cancer ([place])    found during hysterectomy, no chemo,XRT  . Endometrial cancer ([place]) 2004   Early diagnosis  . GERD (gastroesophageal reflux disease)   . GERD (gastroesophageal reflux disease)   . Hyperlipemia   . Hypertension   . IBS (irritable bowel syndrome) 08/13/2014   Dx 2015, diarrhea on-off, Dr [name]   . Lumbar radiculopathy 08/2012   MRI in 08/2012  . Mild depression ([place])    Pt's son has Down's Syndrome  . Mild hyperparathyroidism ([place])   . Osteopenia   . PAF (paroxysmal atrial fibrillation) ([place])   . Prediabetes   . Sciatica of right side 08/2013   Received Depomedrol 80 mg injection  . Sleep apnea 09/09/2013   Dx with OSA in 2015, by Dr. Caryl Comes, APAP    Past Surgical History:  Procedure Laterality Date  . ABDOMINAL HYSTERECTOMY     had B oophorectomy  . CHOLECYSTECTOMY  ~2006  . DILATION AND CURETTAGE OF UTERUS     after SAB  . ESOPHAGOGASTRODUODENOSCOPY  02/09/2014   Dr. [name]  . FINGER SURGERY     Left index  . NASAL SINUS SURGERY     x 2 remotely, [name]  . TONSILLECTOMY    . TOTAL KNEE ARTHROPLASTY Right ~2008    Social History   Social History  . Marital status: Married    Spouse name: N/A  .  Number of children: 1  . Years of education: N/A   Occupational History  . 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 Smoking status: Former Smoker    Quit date: 01/06/1980  . Smokeless tobacco: Never Used  . Alcohol use 2.4 oz/week    4 Glasses of wine per week     Comment: glass of wine 4 nights a week  . Drug use: No  . Sexual activity: No   Other Topics Concern  . Not on file   Social History Narrative   Lives w/ husband, and son [name]        Medication List       Accurate as of 10/19/15  6:52 PM. Always use your most recent med list.          acetaminophen-codeine 300-30 MG tablet Commonly known as:  TYLENOL #3 Take 1 tablet by mouth daily as needed for moderate pain.   CALCIUM CITRATE + PO Take by mouth.   cetirizine 10 MG tablet Commonly known as:  ZYRTEC Take 10 mg by mouth as needed for allergies.   cholecalciferol 1000 units tablet Commonly known as:  VITAMIN D Take 2,000 Units by mouth every other  day.   ciprofloxacin 500 MG tablet Commonly known as:  CIPRO Take 500 mg by mouth as needed (prior to dental work).   Co Q 10 10 MG Caps Take 100 mg by mouth daily.   diphenoxylate-atropine 2.5-0.025 MG tablet Commonly known as:  LOMOTIL Take 2 tablets by mouth as needed for diarrhea or loose stools.   Fish Oil 1200 MG Caps Take 1,200 mg by mouth daily.   fluticasone 50 MCG/ACT nasal spray Commonly known as:  FLONASE Place into both nostrils as needed for allergies or rhinitis.   furosemide 20 MG tablet Commonly known as:  LASIX Take 20 mg by mouth daily as needed for fluid.   GAVISCON PO Take 1-2 tablets by mouth as needed (indigestion).   MULTIVITAMIN PO Take 1 tablet by mouth daily.   propafenone 225 MG tablet Commonly known as:  RYTHMOL Take 2 tablets by mouth daily as needed for afib   ranitidine 150 MG capsule Commonly known as:  ZANTAC Take 150 mg by mouth daily as needed for heartburn.   rivaroxaban 20 MG Tabs  tablet Commonly known as:  XARELTO Take 1 tablet (20 mg total) by mouth daily with supper.   simvastatin 40 MG tablet Commonly known as:  ZOCOR Take 1 tablet (40 mg total) by mouth at bedtime.   valsartan-hydrochlorothiazide 320-25 MG tablet Commonly known as:  DIOVAN-HCT Take 1 tablet by mouth daily.          Objective:   Physical Exam BP 124/68 (BP Location: Left Arm, Patient Position: Sitting, Cuff Size: Normal)   Pulse (!) 58   Temp 97.6 F (36.4 C) (Oral)   Resp 14   Ht 5\' 8"  (1.727 m)   Wt 266 lb 4 oz (120.8 kg)   SpO2 97%   BMI 40.48 kg/m  General:   Well developed, well nourished . NAD.  HEENT:  Normocephalic . Face symmetric, atraumatic Lungs:  CTA B Normal respiratory effort, no intercostal retractions, no accessory muscle use. Heart: RRR,  no murmur.  Trace edema, worse on the left  Skin: Not pale. Not jaundice Neurologic:  alert & oriented X3.  Speech normal, gait appropriate for age and unassisted Psych--  Cognition and judgment appear intact.  Cooperative with normal attention span and concentration.  Behavior appropriate. No anxious or depressed appearing.      Assessment & Plan:  Assessment: Prediabetes HTN Hyperlipidemia Chronic lower extremity edema L>R (eval by previous pcp, related to varicose veins?) CV: Paroxysmal atrial fibrillation  ( Rythmol----> pill-in-pocket) GI: Dr [name]  --GERD, IBS, chronic diarrhea --GI records: [place], 2004, 2009 8 and 07-2013. Negative for polyps or IBD. + Diverticuli. Next 2020 due to [place] OSA--- CPAP, Dr. Maxwell Caul Osteopenia:  Multiple  [place], last two:  T score 2009  -1.1 T score 2015  -0.7 DJD --Chronic back pain-- tylenol #3 prn --- RF per PCP --history of epidurals in the past, MRI 2014: Scoliosis,DJD --CIPRO pre dental d/t knee replacement (rx by ortho) H/o endometrial cancer, found during a hysterectomy, no chemotherapy or XRT. Pap smear 04-2014 within normal per  patient   PLAN: Prediabetes: Doing great with diet and exercise, see HPI HTN: BP is excellent, check a BMP. Continue Diovan HCT. Chronic back pain: Refill Tylenol No. 3. rec back stretching.  RTC 04-2016 for CPX

## 2015-10-19 NOTE — Assessment & Plan Note (Signed)
PLAN: Prediabetes: Doing great with diet and exercise, see HPI HTN: BP is excellent, check a BMP. Continue Diovan HCT. Chronic back pain: Refill Tylenol No. 3. rec back stretching.  RTC 04-2016 for CPX

## 2015-11-11 MED FILL — ACETAMINOPHEN/COD #3 TABLET: 300-30 | 30 days supply | Qty: 30 | Fill #0

## 2015-12-12 ENCOUNTER — Telehealth: Payer: Self-pay | Admitting: Physician Assistant

## 2015-12-12 NOTE — Telephone Encounter (Signed)
Pt had an episode of atrial fib that started last night.  She took er PRN Rhythmol but the afib continued all night. She was going to ask if she could take it again, but converted just before I called.   She has not missed any doses of Xarelto.  The afib has never lasted this long.  Told her she could take the dose once daily, but call first.   F/u as scheduled.  Lenoard Aden 12/11/2015 12:38 PM [facility] [phone]

## 2015-12-13 ENCOUNTER — Telehealth: Payer: Self-pay | Admitting: Internal Medicine

## 2015-12-13 NOTE — Telephone Encounter (Signed)
Reviewed with Dr. Harrington Challenger. OK to keep appt as planned tomorrow. No changes today

## 2015-12-13 NOTE — Telephone Encounter (Signed)
Spoke with pt. She reports episode of atrial fib over weekend. Went back to regular rhythm. Today she had been having frequent PVC's. She has checked pulse and PVC's are every 3-4 beats.  Worse when at rest. Less frequent when up moving around. Felt a little "dizzy in the head" this AM--like something was not right.  Aware of skipped beats. She feels these in her chest. Can hear her heartbeat.  Heart rate running around 60.  BP 146/62-64.  Has not been drinking caffeine.  I scheduled pt to see Truitt Merle, NP tomorrow at 9:00.  I instructed pt to go to ED if symptoms worsen prior to this appt.

## 2015-12-13 NOTE — Telephone Encounter (Signed)
Mrs.Goetzke is calling because she has been having a lot PVC's like every 4 beat and wants to know should she make an appt or go to the hospital . Please call .  Thanks

## 2015-12-14 ENCOUNTER — Encounter: Payer: Self-pay | Admitting: Nurse Practitioner

## 2015-12-14 ENCOUNTER — Ambulatory Visit (INDEPENDENT_AMBULATORY_CARE_PROVIDER_SITE_OTHER): Payer: Commercial Managed Care - HMO | Admitting: Nurse Practitioner

## 2015-12-14 VITALS — BP 128/62 | HR 58 | Ht 68.5 in | Wt 261.4 lb

## 2015-12-14 DIAGNOSIS — I480 Paroxysmal atrial fibrillation: Secondary | ICD-10-CM

## 2015-12-14 DIAGNOSIS — R002 Palpitations: Secondary | ICD-10-CM

## 2015-12-14 LAB — CBC
HCT: 42.3 % (ref 35.0–45.0)
Hemoglobin: 14.3 g/dL (ref 11.7–15.5)
MCH: 29.8 pg (ref 27.0–33.0)
MCHC: 33.8 g/dL (ref 32.0–36.0)
MCV: 88.1 fL (ref 80.0–100.0)
MPV: 10.9 fL (ref 7.5–12.5)
Platelets: 245 10*3/uL (ref 140–400)
RBC: 4.8 MIL/uL (ref 3.80–5.10)
RDW: 14 % (ref 11.0–15.0)
WBC: 6.4 10*3/uL (ref 3.8–10.8)

## 2015-12-14 LAB — BASIC METABOLIC PANEL
BUN: 14 mg/dL (ref 7–25)
CO2: 29 mmol/L (ref 20–31)
Calcium: 10.6 mg/dL — ABNORMAL HIGH (ref 8.6–10.4)
Chloride: 104 mmol/L (ref 98–110)
Creat: 0.76 mg/dL (ref 0.50–0.99)
Glucose, Bld: 108 mg/dL — ABNORMAL HIGH (ref 65–99)
Potassium: 3.6 mmol/L (ref 3.5–5.3)
Sodium: 141 mmol/L (ref 135–146)

## 2015-12-14 NOTE — Progress Notes (Signed)
CARDIOLOGY OFFICE NOTE  Date:  12/14/2015    [name] Date of Birth: 11-Jan-1945 Medical Record [id number]  PCP:  Kathlene November, MD  Cardiologist:  Caryl Comes    Chief Complaint  Patient presents with  . Palpitations    Work in visit - seen for Dr. Caryl Comes    History of Present Illness: Veronica Bradford is a 70 y.o. female who presents today for a work in visit. Seen for Dr. Caryl Comes. She has a history of HTN, HLD, PAF, chronic anticoagulation, OSA and obesity.   Last seen in September by Dr. Caryl Comes. Switched from prn Flecainide to prn Rhythmol. Has not tolerated Eliquis. She remains on Xarelto.   Comes in today. Here alone. Admits she is anxious. Complains of having more PVCs - every 3rd and 4th beat - worse over the past few days. Felt tired with this. No real chest pain. Not short of breath. Noted at rest - not so much when "up and around". No regular exercise. Says she has been having more atrial fib - was about every 4 to 6 weeks - now every other week for the past 6 weeks. Does use prn Rhythmol on occasion which seems to help - typically converts within an hour or so. She is concerned over her 1st degree AV block today. BP elevated yesterday.   Past Medical History:  Diagnosis Date  . Back pain   . Chronic sinusitis    s/p 2 surgeries remotely, [name]  . Closed fracture of metatarsal of left foot    L foot, fifth metatarsal  . Ectopic pregnancy   . Endometrial cancer ([place])    found during hysterectomy, no chemo,XRT  . Endometrial cancer ([place]) 2004   Early diagnosis  . GERD (gastroesophageal reflux disease)   . GERD (gastroesophageal reflux disease)   . Hyperlipemia   . Hypertension   . IBS (irritable bowel syndrome) 08/13/2014   Dx 2015, diarrhea on-off, Dr [name]   . Lumbar radiculopathy 08/2012   MRI in 08/2012  . Mild depression ([place])    Pt's son has Down's Syndrome  . Mild hyperparathyroidism ([place])   . Osteopenia   . PAF (paroxysmal atrial fibrillation) ([place])   .  Prediabetes   . Sciatica of right side 08/2013   Received Depomedrol 80 mg injection  . Sleep apnea 09/09/2013   Dx with OSA in 2015, by Dr. Caryl Comes, APAP    Past Surgical History:  Procedure Laterality Date  . ABDOMINAL HYSTERECTOMY     had B oophorectomy  . CHOLECYSTECTOMY  ~2006  . DILATION AND CURETTAGE OF UTERUS     after SAB  . ESOPHAGOGASTRODUODENOSCOPY  02/09/2014   Dr. [name]  . FINGER SURGERY     Left index  . NASAL SINUS SURGERY     x 2 remotely, [name]  . TONSILLECTOMY    . TOTAL KNEE ARTHROPLASTY Right ~2008     Medications: Current Outpatient Prescriptions  Medication Sig Dispense Refill  . acetaminophen-codeine (TYLENOL #3) 300-30 MG tablet Take 1 tablet by mouth daily as needed for moderate pain. 30 tablet 0  . Alum Hydroxide-Mag Carbonate (GAVISCON PO) Take 1-2 tablets by mouth as needed (indigestion).    . Calcium Citrate-Vitamin D (CALCIUM CITRATE + PO) Take by mouth.    . cetirizine (ZYRTEC) 10 MG tablet Take 10 mg by mouth as needed for allergies.    . cholecalciferol (VITAMIN D) 1000 UNITS tablet Take 2,000 Units by mouth every other day.    Marland Kitchen  ciprofloxacin (CIPRO) 500 MG tablet Take 500 mg by mouth as needed (prior to dental work).    . Coenzyme Q10 (CO Q 10) 10 MG CAPS Take 100 mg by mouth daily.     . diphenoxylate-atropine (LOMOTIL) 2.5-0.025 MG tablet Take 2 tablets by mouth as needed for diarrhea or loose stools.    . fluticasone (FLONASE) 50 MCG/ACT nasal spray Place into both nostrils as needed for allergies or rhinitis.    . furosemide (LASIX) 20 MG tablet Take 20 mg by mouth daily as needed for fluid.    . Multiple Vitamins-Minerals (MULTIVITAMIN PO) Take 1 tablet by mouth daily.    . Omega-3 Fatty Acids (FISH OIL) 1200 MG CAPS Take 1,200 mg by mouth daily.    . propafenone (RYTHMOL) 225 MG tablet Take 2 tablets by mouth daily as needed for afib 90 tablet 3  . ranitidine (ZANTAC) 150 MG capsule Take 150 mg by mouth daily as needed for heartburn.     . rivaroxaban (XARELTO) 20 MG TABS tablet Take 1 tablet (20 mg total) by mouth daily with supper. 90 tablet 3  . simvastatin (ZOCOR) 40 MG tablet Take 1 tablet (40 mg total) by mouth at bedtime. 90 tablet 0  . valsartan-hydrochlorothiazide (DIOVAN-HCT) 320-25 MG tablet Take 1 tablet by mouth daily. 90 tablet 0   No current facility-administered medications for this visit.     Allergies: Allergies  Allergen Reactions  . Nsaids Other (See Comments)    Pt on Xarelto  . Tolmetin Other (See Comments)    Pt on Xarelto  . Tramadol Swelling  . Zoster Vaccine Live Other (See Comments)    Sickness per Pt.   Marland Kitchen Amoxicillin Rash    Social History: The patient  reports that she quit smoking about 35 years ago. She has never used smokeless tobacco. She reports that she drinks about 2.4 oz of alcohol per week . She reports that she does not use drugs.   Family History: The patient's family history includes Breast cancer in her mother; Cancer in her mother and sister; Colon cancer in her mother; Diabetes in her father; Hypertension in her father and mother.   Review of Systems: Please see the history of present illness.   Otherwise, the review of systems is positive for IBS with diarrhea.   All other systems are reviewed and negative.   Physical Exam: VS:  BP 128/62   Pulse (!) 58   Ht 5' 8.5" (1.74 m)   Wt 261 lb 6.4 oz (118.6 kg)   BMI 39.17 kg/m  .  BMI Body mass index is 39.17 kg/m.  Wt Readings from Last 3 Encounters:  12/14/15 261 lb 6.4 oz (118.6 kg)  10/19/15 266 lb 4 oz (120.8 kg)  09/23/15 265 lb 3.2 oz (120.3 kg)    General: Pleasant. Obese female who is alert and in no acute distress.   HEENT: Normal.  Neck: Supple, no JVD, carotid bruits, or masses noted.  Cardiac: Regular rate and rhythm. Soft outflow murmur. No edema.  Respiratory:  Lungs are clear to auscultation bilaterally with normal work of breathing.  GI: Obese. Soft and nontender.  MS: No deformity or atrophy.  Gait and ROM intact.  Skin: Warm and dry. Color is normal.  Neuro:  Strength and sensation are intact and no gross focal deficits noted.  Psych: Alert, appropriate and with normal affect.   LABORATORY DATA:  EKG:  EKG is ordered today. This demonstrates NSR with LBBB. 1st degree AV  block noted.   Lab Results  Component Value Date   WBC 7.4 04/14/2015   HGB 13.8 04/14/2015   HCT 41.6 04/14/2015   PLT 213.0 04/14/2015   GLUCOSE 96 10/19/2015   CHOL 154 04/14/2015   TRIG 94.0 04/14/2015   HDL 57.20 04/14/2015   LDLCALC 78 04/14/2015   ALT 23 04/14/2015   AST 24 04/14/2015   NA 143 10/19/2015   K 3.7 10/19/2015   CL 106 10/19/2015   CREATININE 0.69 10/19/2015   BUN 17 10/19/2015   CO2 29 10/19/2015   TSH 1.03 04/14/2015   INR 2.5 (H) 11/08/2006   HGBA1C 5.7 08/13/2014    BNP (last 3 results) No results for input(s): BNP in the last 8760 hours.  ProBNP (last 3 results) No results for input(s): PROBNP in the last 8760 hours.   Other Studies Reviewed Today:  Echo Study Conclusions from 08/2015  - Left ventricle: The cavity size was normal. Wall thickness was   increased in a pattern of mild LVH. There was mild focal basal   hypertrophy of the septum. Systolic function was normal. The   estimated ejection fraction was in the range of 60% to 65%. Wall   motion was normal; there were no regional wall motion   abnormalities. Doppler parameters are consistent with abnormal   left ventricular relaxation (grade 1 diastolic dysfunction). - Left atrium: The atrium was moderately dilated.  Impressions:  - Normal LV systolic function; mild LVH; grade 1 diastolic   dysfunction; moderate LAE; trace TR.  Assessment/Plan: 1. Palpitations/PVCs/PAF - will place event monitor to quantify. May need to consider daily AAD therapy - will defer to Dr. Caryl Comes. Needs labs today. Has resting bradycardia which may limit therapy going forward. I have left her on her current regimen for  now.   2. Chronic anticoagulation - on Xarelto.   3. HTN - BP ok today. Would continue to monitor for now.   4. HLD  5. OSA  6. Obesity   Current medicines are reviewed with the patient today.  The patient does not have concerns regarding medicines other than what has been noted above.  The following changes have been made:  See above.  Labs/ tests ordered today include:    Orders Placed This Encounter  Procedures  . Basic metabolic panel  . CBC  . Cardiac event monitor  . EKG 12-Lead     Disposition:   FU with Dr. Caryl Comes after her event monitor.   Patient is agreeable to this plan and will call if any problems develop in the interim.   Signed: Burtis Junes, RN, ANP-C 12/14/2015 9:29 AM  [facility] [address] [place], [place]  [address] Phone: [phone] Fax: [phone]

## 2015-12-14 NOTE — Patient Instructions (Addendum)
We will be checking the following labs today - BMET and CBC   Medication Instructions:    Continue with your current medicines for now    Testing/Procedures To Be Arranged:  Event monitor   Follow-Up:   See Dr. Caryl Comes in follow up in about 5 weeks.     Other Special Instructions:   N/A    If you need a refill on your cardiac medications before your next appointment, please call your pharmacy.   Call the [facility] office at [phone] if you have any questions, problems or concerns.

## 2015-12-15 ENCOUNTER — Telehealth: Payer: Self-pay | Admitting: Nurse Practitioner

## 2015-12-15 NOTE — Telephone Encounter (Signed)
Patient returning Veronica Bradford's call.  

## 2015-12-16 ENCOUNTER — Telehealth: Payer: Self-pay | Admitting: Internal Medicine

## 2015-12-16 DIAGNOSIS — G4733 Obstructive sleep apnea (adult) (pediatric): Secondary | ICD-10-CM

## 2015-12-16 NOTE — Telephone Encounter (Signed)
Caller name: Relationship to patient: Self Can be reached: [phone]  Pharmacy:  Reason for call: Request referral to Dr. Nehemiah Settle @ [facility] [address] [place], [place], [place] [address]     [phone]  NPI: [id number] Dx code: G26.33  Patient has appointment on Monday 12/18

## 2015-12-16 NOTE — Telephone Encounter (Signed)
Referral placed.

## 2015-12-20 DIAGNOSIS — G4733 Obstructive sleep apnea (adult) (pediatric): Secondary | ICD-10-CM | POA: Diagnosis not present

## 2015-12-22 ENCOUNTER — Ambulatory Visit (INDEPENDENT_AMBULATORY_CARE_PROVIDER_SITE_OTHER): Payer: Commercial Managed Care - HMO

## 2015-12-22 DIAGNOSIS — I480 Paroxysmal atrial fibrillation: Secondary | ICD-10-CM | POA: Diagnosis not present

## 2015-12-22 DIAGNOSIS — R002 Palpitations: Secondary | ICD-10-CM | POA: Diagnosis not present

## 2016-01-06 ENCOUNTER — Telehealth: Payer: Self-pay | Admitting: Internal Medicine

## 2016-01-06 NOTE — Telephone Encounter (Signed)
I called and spoke with the patient. She states that she saw Truitt Merle, NP the middle of December due to a-fib episodes she was having, but she was also having PVC's. She has been wearing an event monitor since 12/22/15. She states the last 3 days have been "pretty bad" for her with what she believes are PVC's. She feels she is having a lot of bigeminy/ trigeminy. She is not recording every episodes as she states "I would have trigger finger from all the episodes." She states she is not really having any symptoms except she does not feel "in the game" and is not focused. She has not taken any PRN propafenone. I advised her I will speak with our monitor tech to try to obtain tracings from her events thus far to confirm what she is having. I will call her back this afternoon once Dr. Caryl Comes reviews. She is agreeable.

## 2016-01-06 NOTE — Telephone Encounter (Signed)
New Message    Heart Rate is 50, very lethargic, but thinks she is having 2-3 extra beat a minute.  Patient denies having SOB or any other symptoms

## 2016-01-06 NOTE — Telephone Encounter (Signed)
Called and spoke w Pt about symptomatic PVCs  Will try first Mg  Oxide 400 bid  If that doesn't work, will try flecainide 50 bid  If so will plan to do GXT Myoview  Complaints of chest discomfort but neg myoview 2015

## 2016-01-06 NOTE — Telephone Encounter (Signed)
New Message   Patient calling to follow up with Nira Conn about previous conversation

## 2016-01-13 ENCOUNTER — Telehealth: Payer: Self-pay | Admitting: *Deleted

## 2016-01-13 NOTE — Telephone Encounter (Signed)
Received cardiac monitor report for 01/12/2016 @3 :58ET. Report is for Sinus rhythm, Sinus arrhythmia w/run of V-Tach (5beats).  Called patient, she stated that she didn't have any symptoms.  She did say that she has a terrible cold and started doing "chest cupping" to break up congestion sometime in the afternoon but quit when she thought that it may affect the monitor.  She stated that she didn't feel any heart symptoms all day, which was better than previous days.  She is asking to be advised about what to do or not do with the "chest cupping" I advised that she would be called back.

## 2016-01-14 ENCOUNTER — Telehealth: Payer: Self-pay | Admitting: Internal Medicine

## 2016-01-14 NOTE — Telephone Encounter (Signed)
Returned call to patient.She stated she never received a call back from [place] yesterday regarding the run of VT on her monitor.Stated she continues to have irregular heart beat this morning.She stopped taking Flecainide,only took 4 doses.Stated Flecainide made her feel tired.She is taking the increased dose of magnesium and potassium.Stated she is concerned she had a run of V Tach.She would like a call from [place].She needs to know what he has planned for her.Advised Dr.Klein is out of office.I will send message to him.

## 2016-01-14 NOTE — Telephone Encounter (Signed)
New message      Pt is calling to follow up on conversation from yesterday.  She states no one has called her back.  She also needs to give update on PVC's.  She wishes to talk to Legrand Como since she talked to him yesterday

## 2016-01-17 ENCOUNTER — Encounter: Payer: Self-pay | Admitting: *Deleted

## 2016-01-17 NOTE — Telephone Encounter (Signed)
F/U Call  Veronica Bradford is calling because his wife standing heart rate is 62 and they are very concerned. Please call

## 2016-01-17 NOTE — Telephone Encounter (Signed)
Returned call to patient she stated she wanted to let Dr.Klein know she is having frequent PVC's.Stated she feels bad no energy,weakness,cannot concentrate.Stated she wanted to know if she needs to restart Flecainide or take another medication for PVC's.Message sent to [place] for advice.

## 2016-01-23 DIAGNOSIS — I10 Essential (primary) hypertension: Secondary | ICD-10-CM | POA: Diagnosis not present

## 2016-01-23 DIAGNOSIS — J018 Other acute sinusitis: Secondary | ICD-10-CM | POA: Diagnosis not present

## 2016-01-25 ENCOUNTER — Other Ambulatory Visit: Payer: Self-pay | Admitting: Internal Medicine

## 2016-01-25 MED ORDER — RIVAROXABAN 20 MG PO TABS: 20.0000 mg | ORAL_TABLET | Freq: Every day | ORAL | 3 refills | Status: DC

## 2016-01-25 NOTE — Telephone Encounter (Signed)
Thought we would try dronaderone 400 bid

## 2016-01-25 NOTE — Telephone Encounter (Signed)
I called and spoke with the patient to advise her of Dr. Olin Pia recommendations for trying Multaq 400 mg BID for PVC's. She states she is currently on magnesium oxide 400 mg BID- started 01/06/16. She tried the flecainide for 2 days and felt no relief with the amount of PVC's that she was having.  She has turned in her monitor and currently feels that her level of PVC's has decreased.  Her husband had been on multaq 400 mg BID and is off this so she does have some of this at home to take.  I advised her if she is feeling ok, she can hold off on starting this, but if her PVC's seem to worsen, she will start multaq 400 mg BID.  She voices understanding and is agreeable.  She is scheduled to follow up with Dr. Caryl Comes on 01/31/16.

## 2016-01-31 ENCOUNTER — Encounter: Payer: Self-pay | Admitting: Internal Medicine

## 2016-01-31 ENCOUNTER — Ambulatory Visit (INDEPENDENT_AMBULATORY_CARE_PROVIDER_SITE_OTHER): Payer: Medicare Other | Admitting: Internal Medicine

## 2016-01-31 VITALS — BP 114/56 | HR 62 | Ht 68.0 in | Wt 263.2 lb

## 2016-01-31 DIAGNOSIS — I480 Paroxysmal atrial fibrillation: Secondary | ICD-10-CM | POA: Diagnosis not present

## 2016-01-31 DIAGNOSIS — I493 Ventricular premature depolarization: Secondary | ICD-10-CM | POA: Diagnosis not present

## 2016-01-31 MED ORDER — FLECAINIDE ACETATE 100 MG PO TABS: ORAL_TABLET | ORAL | Status: DC

## 2016-01-31 NOTE — Progress Notes (Signed)
Patient Care Team: Colon Branch, MD as PCP - General (Internal Medicine) Marius Ditch, MD as Consulting Physician (Internal Medicine) [name], MD as Consulting Physician (Cardiology) Wylene Simmer, MD as Consulting Physician (Orthopedic Surgery)   HPI  GAE HASENAUER is a 71 y.o. female Seen in followup for AFib and now PVCs. She underwent an event recorder which was reviewed personally and demonstrated 0-14% PVCs (monomorphic)  She also recounts that this summer she developed PVCs. I got involved. She has continued to have symptoms. An event recorder demonstrated nonsustained ventricular tachycardia.  She notes no clear association with her irritable bowel or other things with her PVCs. She has eliminated caffeine from her diet. She is becoming teetotaler  Echo >> normal LV function with LVH  With normal LV   She was seen in the ER 8/15 with recurrent AF   Started on apixoban    She is currently on Rivaroxaban  She had atrial fibrillation  2/16 and went to the ER for possible AFib.  receivied flecainide >>> NSR    She has had 2 other episodes of afib each lasting about 3 hrs and terminating with fleciainide this largely still works if she is able to not vomit taking her flecainide. There does not seem to be any particular explanation to the episodic vomiting.               Past Medical History:  Diagnosis Date  . Back pain   . Chronic sinusitis    s/p 2 surgeries remotely, [name]  . Closed fracture of metatarsal of left foot    L foot, fifth metatarsal  . Ectopic pregnancy   . Endometrial cancer ([place])    found during hysterectomy, no chemo,XRT  . Endometrial cancer ([place]) 2004   Early diagnosis  . GERD (gastroesophageal reflux disease)   . GERD (gastroesophageal reflux disease)   . Hyperlipemia   . Hypertension   . IBS (irritable bowel syndrome) 08/13/2014   Dx 2015, diarrhea on-off, Dr [name]   . Lumbar radiculopathy 08/2012   MRI in 08/2012  . Mild  depression ([place])    Pt's son has Down's Syndrome  . Mild hyperparathyroidism ([place])   . Osteopenia   . PAF (paroxysmal atrial fibrillation) ([place])   . Prediabetes   . Sciatica of right side 08/2013   Received Depomedrol 80 mg injection  . Sleep apnea 09/09/2013   Dx with OSA in 2015, by Dr. Caryl Comes, APAP    Past Surgical History:  Procedure Laterality Date  . CHOLECYSTECTOMY  ~2006  . DILATION AND CURETTAGE OF UTERUS     after SAB  . ESOPHAGOGASTRODUODENOSCOPY  02/09/2014   Dr. [name]  . FINGER SURGERY Left    index  . NASAL SINUS SURGERY     x 2 remotely, [name]  . TONSILLECTOMY    . TOTAL ABDOMINAL HYSTERECTOMY W/ BILATERAL SALPINGOOPHORECTOMY    . TOTAL KNEE ARTHROPLASTY Right ~2008    Current Outpatient Prescriptions  Medication Sig Dispense Refill  . acetaminophen-codeine (TYLENOL #3) 300-30 MG tablet Take 1 tablet by mouth daily as needed for moderate pain. 30 tablet 0  . Alum Hydroxide-Mag Carbonate (GAVISCON PO) Take 1-2 tablets by mouth as needed (indigestion).    . Calcium Citrate-Vitamin D (CALCIUM CITRATE + PO) Take by mouth.    . cetirizine (ZYRTEC) 10 MG tablet Take 10 mg by mouth as needed for allergies.    . cholecalciferol (VITAMIN D) 1000 UNITS  tablet Take 2,000 Units by mouth every other day.    . ciprofloxacin (CIPRO) 500 MG tablet Take 500 mg by mouth as needed (prior to dental work).    . Coenzyme Q10 (CO Q 10) 10 MG CAPS Take 100 mg by mouth daily.     . diphenoxylate-atropine (LOMOTIL) 2.5-0.025 MG tablet Take 2 tablets by mouth as needed for diarrhea or loose stools.    Noelle Penner MAGNESIUM CITRATE PO Take 500 mg by mouth 2 (two) times daily.     . fluticasone (FLONASE) 50 MCG/ACT nasal spray Place into both nostrils as needed for allergies or rhinitis.    . furosemide (LASIX) 20 MG tablet Take 20 mg by mouth daily as needed for fluid.    . Multiple Vitamins-Minerals (MULTIVITAMIN PO) Take 1 tablet by mouth daily.    . Omega-3 Fatty Acids (FISH OIL) 1200 MG  CAPS Take 1,200 mg by mouth daily.    . propafenone (RYTHMOL) 225 MG tablet Take 2 tablets by mouth daily as needed for afib 90 tablet 3  . ranitidine (ZANTAC) 150 MG capsule Take 150 mg by mouth daily as needed for heartburn.    . rivaroxaban (XARELTO) 20 MG TABS tablet Take 1 tablet (20 mg total) by mouth daily with supper. 90 tablet 3  . simvastatin (ZOCOR) 40 MG tablet Take 1 tablet (40 mg total) by mouth at bedtime. 90 tablet 0  . valsartan-hydrochlorothiazide (DIOVAN-HCT) 320-25 MG tablet Take 1 tablet by mouth daily. 90 tablet 0   No current facility-administered medications for this visit.     Allergies  Allergen Reactions  . Nsaids Other (See Comments)    Pt on Xarelto  . Tolmetin Other (See Comments)    Pt on Xarelto  . Tramadol Swelling  . Zoster Vaccine Live Other (See Comments)    Sickness per Pt.   Marland Kitchen Amoxicillin Rash    Review of Systems negative except from HPI and PMH  Physical Exam BP (!) 114/56   Pulse 62   Ht 5\' 8"  (1.727 m)   Wt 263 lb 3.2 oz (119.4 kg)   SpO2 98%   BMI 40.02 kg/m  Well developed and well nourished in no acute distress HENT normal E scleral and icterus clear Neck Supple Clear to ausculation  Regular rate and rhythm, no murmurs gallops or rub Soft with active bowel sounds No clubbing cyanosis no  Edema Alert and oriented, grossly normal motor and sensory function Skin Warm and Dry  ECG demonstrates sinus rhythm at 60 intervals 19/14/45 IVCD  Assessment and  Plan  Atrial fibrillation-paroxysmal   CHADS-VASc score 2 (age/gender)   OSA well treated  Obesity  PVCs  Sinus bradycardia   Sinus bradycardia makes antiarrhythmic therapy for PVCs and/or atrial fibrillation more challenging. For now, we have elected to use flecainide as a when necessary for her PVCs at a dose of 100 mg twice daily. We have discussed the impact of atrial fibrillation and its need for adjunctive AV nodal blocking agents in the event that we were to  continue with 1 C therapy  Currently atrial fibrillation is quiescent  On Anticoagulation;  No bleeding issues   More than 50% of 45 min was spent in counseling related to the above

## 2016-01-31 NOTE — Patient Instructions (Signed)
Medication Instructions: - Your physician has recommended you make the following change in your medication:  1) take flecainide 100 mg - one tablet by mouth twice daily as needed for palpitations  Labwork: - none ordered  Procedures/Testing: - none ordered  Follow-Up: - Your physician recommends that you schedule a follow-up appointment in: 3-4 months with Dr. Caryl Comes.   Any Additional Special Instructions Will Be Listed Below (If Applicable).     If you need a refill on your cardiac medications before your next appointment, please call your pharmacy.

## 2016-02-25 ENCOUNTER — Telehealth: Payer: Self-pay | Admitting: Internal Medicine

## 2016-02-25 ENCOUNTER — Other Ambulatory Visit: Payer: Self-pay | Admitting: Internal Medicine

## 2016-02-28 MED ORDER — SIMVASTATIN 40 MG PO TABS: 40.0000 mg | ORAL_TABLET | Freq: Every day | ORAL | 0 refills | Status: DC

## 2016-02-28 MED ORDER — VALSARTAN-HYDROCHLOROTHIAZIDE 320-25 MG PO TABS: 1.0000 | ORAL_TABLET | Freq: Every day | ORAL | 0 refills | Status: DC

## 2016-02-28 MED ORDER — ACETAMINOPHEN-CODEINE #3 300-30 MG PO TABS: 1.0000 | ORAL_TABLET | Freq: Every day | ORAL | 0 refills | Status: DC | PRN

## 2016-02-28 NOTE — Telephone Encounter (Signed)
Rx printed, awaiting MD signature.  

## 2016-02-28 NOTE — Telephone Encounter (Signed)
Okay #30, no refills 

## 2016-02-28 NOTE — Telephone Encounter (Signed)
Pt is requesting refill on Tylenol #3.  Last OV: 04/14/2015, cpe 05/02/2016 Last Fill: 10/19/2015 #30 and 0RF UDS: None  Please advise.

## 2016-02-28 NOTE — Telephone Encounter (Signed)
Pt informed via MyChart that Rx has been placed at front desk for pick up at her convenience.  

## 2016-03-09 MED FILL — ACETAMINOPHEN/COD #3 TABLET: 300-30 | 30 days supply | Qty: 30 | Fill #0

## 2016-04-13 ENCOUNTER — Other Ambulatory Visit: Payer: Self-pay | Admitting: Internal Medicine

## 2016-04-20 ENCOUNTER — Encounter: Payer: Self-pay | Admitting: Internal Medicine

## 2016-04-20 ENCOUNTER — Ambulatory Visit (INDEPENDENT_AMBULATORY_CARE_PROVIDER_SITE_OTHER): Payer: Medicare Other | Admitting: Internal Medicine

## 2016-04-20 VITALS — BP 126/80 | HR 61 | Temp 98.2°F | Resp 14 | Ht 68.0 in | Wt 261.5 lb

## 2016-04-20 DIAGNOSIS — R1032 Left lower quadrant pain: Secondary | ICD-10-CM | POA: Diagnosis not present

## 2016-04-20 LAB — POCT URINALYSIS DIPSTICK
Bilirubin, UA: NEGATIVE
Blood, UA: NEGATIVE
Glucose, UA: NEGATIVE
Ketones, UA: NEGATIVE
Leukocytes, UA: NEGATIVE
Nitrite, UA: NEGATIVE
Spec Grav, UA: 1.01 (ref 1.010–1.025)
Urobilinogen, UA: 0.2 E.U./dL
pH, UA: 7 (ref 5.0–8.0)

## 2016-04-20 MED ORDER — SULFAMETHOXAZOLE-TRIMETHOPRIM 800-160 MG PO TABS: 1.0000 | ORAL_TABLET | Freq: Two times a day (BID) | ORAL | 0 refills | Status: DC

## 2016-04-20 MED FILL — SULFAMETHOXAZOLE/TMP DS TAB: 800-160 | 5 days supply | Qty: 10 | Fill #0

## 2016-04-20 NOTE — Assessment & Plan Note (Signed)
LLQ pain, suprapubic discomfort, LUTS: Abdominal exam is benign, Udip + protein. No alarm symptoms. She is concerned about her history of cancer. DDX includes a UTI, IBS, doubt diverticulitis. Doubt cancer related. Plan: Empiric Bactrim 5 days, ER if severe symptoms, check a UA urine culture. She has an appointment with me in few days, reassess then.

## 2016-04-20 NOTE — Progress Notes (Signed)
Subjective:    Patient ID: Veronica Bradford, female    DOB: 05/08/1945, 71 y.o.   MRN: [id number]  DOS:  04/20/2016 Type of visit - description : acute Interval history: 3 weeks h/o  abd pain, on-off, intensity range from 1/10 to 4/10. Located at [facility] mostly, does report suprapubic pressure, a feeling of need to urinate even minutes after she urinate.  No gross hematuria, no dysuria per se   Review of Systems No fever chills No nausea, vomiting, blood in the stool constipation. Has IBS and chronic diarrhea, currently symptoms are controlled with a small dose of Imodium daily Denies any vaginal discharge, bleeding or rash.  Past Medical History:  Diagnosis Date  . Back pain   . Chronic sinusitis    s/p 2 surgeries remotely, [name]  . Closed fracture of metatarsal of left foot    L foot, fifth metatarsal  . Ectopic pregnancy   . Endometrial cancer ([place])    found during hysterectomy, no chemo,XRT  . Endometrial cancer ([place]) 2004   Early diagnosis  . GERD (gastroesophageal reflux disease)   . GERD (gastroesophageal reflux disease)   . Hyperlipemia   . Hypertension   . IBS (irritable bowel syndrome) 08/13/2014   Dx 2015, diarrhea on-off, Dr [name]   . Lumbar radiculopathy 08/2012   MRI in 08/2012  . Mild depression ([place])    Pt's son has Down's Syndrome  . Mild hyperparathyroidism ([place])   . Osteopenia   . PAF (paroxysmal atrial fibrillation) ([place])   . Prediabetes   . Sciatica of right side 08/2013   Received Depomedrol 80 mg injection  . Sleep apnea 09/09/2013   Dx with OSA in 2015, by Dr. Caryl Comes, APAP    Past Surgical History:  Procedure Laterality Date  . CHOLECYSTECTOMY  ~2006  . DILATION AND CURETTAGE OF UTERUS     after SAB  . ESOPHAGOGASTRODUODENOSCOPY  02/09/2014   Dr. [name]  . FINGER SURGERY Left    index  . NASAL SINUS SURGERY     x 2 remotely, [name]  . TONSILLECTOMY    . TOTAL ABDOMINAL HYSTERECTOMY W/ BILATERAL SALPINGOOPHORECTOMY    . TOTAL KNEE  ARTHROPLASTY Right ~2008    Social History   Social History  . Marital status: Married    Spouse name: N/A  . Number of children: 1  . Years of education: N/A   Occupational History  . 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 Smoking status: Former Smoker    Quit date: 01/06/1980  . Smokeless tobacco: Never Used  . Alcohol use 2.4 oz/week    4 Glasses of wine per week     Comment: glass of wine 4 nights a week  . Drug use: No  . Sexual activity: No   Other Topics Concern  . Not on file   Social History Narrative   Lives w/ husband, and son [name]      Allergies as of 04/20/2016      Reactions   Nsaids Other (See Comments)   Pt on Xarelto   Tolmetin Other (See Comments)   Pt on Xarelto   Tramadol Swelling   Zoster Vaccine Live Other (See Comments)   Sickness per Pt.    Amoxicillin Rash      Medication List       Accurate as of 04/20/16 12:39 PM. Always use your most recent med list.          acetaminophen-codeine  300-30 MG tablet Commonly known as:  TYLENOL #3 Take 1 tablet by mouth daily as needed for moderate pain.   CALCIUM CITRATE + PO Take by mouth.   cetirizine 10 MG tablet Commonly known as:  ZYRTEC Take 10 mg by mouth as needed for allergies.   cholecalciferol 1000 units tablet Commonly known as:  VITAMIN D Take 2,000 Units by mouth every other day.   ciprofloxacin 500 MG tablet Commonly known as:  CIPRO Take 500 mg by mouth as needed (prior to dental work).   Co Q 10 10 MG Caps Take 100 mg by mouth daily.   diphenoxylate-atropine 2.5-0.025 MG tablet Commonly known as:  LOMOTIL Take 2 tablets by mouth as needed for diarrhea or loose stools.   Fish Oil 1200 MG Caps Take 1,200 mg by mouth daily.   flecainide 100 MG tablet Commonly known as:  TAMBOCOR Take one tablet (100 mg) by mouth twice daily as needed for PVC's/ palpitations   fluticasone 50 MCG/ACT nasal spray Commonly known as:  FLONASE Place into both nostrils  as needed for allergies or rhinitis.   furosemide 20 MG tablet Commonly known as:  LASIX Take 20 mg by mouth daily as needed for fluid.   GAVISCON PO Take 1-2 tablets by mouth as needed (indigestion).   MAGNESIUM-OXIDE PO Take 500 mg by mouth 2 (two) times daily.   MULTIVITAMIN PO Take 1 tablet by mouth daily.   propafenone 225 MG tablet Commonly known as:  RYTHMOL Take 2 tablets by mouth daily as needed for afib   ranitidine 150 MG capsule Commonly known as:  ZANTAC Take 150 mg by mouth daily as needed for heartburn.   rivaroxaban 20 MG Tabs tablet Commonly known as:  XARELTO Take 1 tablet (20 mg total) by mouth daily with supper.   simvastatin 40 MG tablet Commonly known as:  ZOCOR Take 1 tablet (40 mg total) by mouth at bedtime.   sulfamethoxazole-trimethoprim 800-160 MG tablet Commonly known as:  BACTRIM DS,SEPTRA DS Take 1 tablet by mouth 2 (two) times daily.   valsartan-hydrochlorothiazide 320-25 MG tablet Commonly known as:  DIOVAN-HCT Take 1 tablet by mouth daily.          Objective:   Physical Exam BP 126/80 (BP Location: Left Arm, Patient Position: Sitting, Cuff Size: Normal)   Pulse 61   Temp 98.2 F (36.8 C) (Oral)   Resp 14   Ht 5\' 8"  (1.727 m)   Wt 261 lb 8 oz (118.6 kg)   SpO2 96%   BMI 39.76 kg/m  General:   Well developed, well nourished . NAD.  HEENT:  Normocephalic . Face symmetric, atraumatic Lungs:  CTA B Normal respiratory effort, no intercostal retractions, no accessory muscle use. Heart: RRR,  no murmur.  no pretibial edema bilaterally  Abdomen:  Not distended, soft, non-tender. No rebound or rigidity.  Skin: Not pale. Not jaundice Neurologic:  alert & oriented X3.  Speech normal, gait appropriate for age and unassisted Psych--  Cognition and judgment appear intact.  Cooperative with normal attention span and concentration.  Behavior appropriate. No anxious or depressed appearing.    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 Negative for polyps or IBD. + Diverticuli. Next 2020 due  to [place] OSA--- CPAP, Dr. Maxwell Caul Osteopenia:  Multiple  Dexas, last two:  T score 2009  -1.1 T score 2015  -0.7 DJD --Chronic back pain-- tylenol #3 prn --- RF per PCP --history of epidurals in the past, MRI 2014: Scoliosis,DJD --CIPRO pre dental d/t knee replacement (rx by ortho) H/o endometrial cancer, found during a hysterectomy, no chemotherapy or XRT. Pap smear 04-2014 within normal per patient   PLAN: LLQ pain, suprapubic discomfort, LUTS: Abdominal exam is benign, Udip + protein. No alarm symptoms. She is concerned about her history of cancer. DDX includes a UTI, IBS, doubt diverticulitis. Doubt cancer related. Plan: Empiric Bactrim 5 days, ER if severe symptoms, check a UA urine culture. She has an appointment with me in few days, reassess then.

## 2016-04-20 NOTE — Patient Instructions (Addendum)
Drink plenty of fluids  Take Bactrim DS one tablet twice a day for 5 days  Call or go to the ER if severe symptoms, fever, chills, blood  in the stools

## 2016-04-20 NOTE — Progress Notes (Signed)
Pre visit review using our clinic review tool, if applicable. No additional management support is needed unless otherwise documented below in the visit note. 

## 2016-04-21 LAB — URINALYSIS, ROUTINE W REFLEX MICROSCOPIC
Bilirubin Urine: NEGATIVE
Hgb urine dipstick: NEGATIVE
Ketones, ur: NEGATIVE
LEUKOCYTES UA: NEGATIVE
Nitrite: NEGATIVE
SPECIFIC GRAVITY, URINE: 1.015 (ref 1.000–1.030)
Total Protein, Urine: NEGATIVE
URINE GLUCOSE: NEGATIVE
Urobilinogen, UA: 0.2 (ref 0.0–1.0)
pH: 7.5 (ref 5.0–8.0)

## 2016-04-23 LAB — URINE CULTURE

## 2016-04-24 MED ORDER — NITROFURANTOIN MONOHYD MACRO 100 MG PO CAPS: 100.0000 mg | ORAL_CAPSULE | Freq: Two times a day (BID) | ORAL | 0 refills | Status: DC

## 2016-04-24 MED FILL — NITROFURANTOIN MONO-MCR 100: 100 | 3 days supply | Qty: 6 | Fill #0

## 2016-04-24 NOTE — Addendum Note (Signed)
Addended byDamita Dunnings D on: 04/24/2016 01:08 PM   Modules accepted: Orders

## 2016-04-27 ENCOUNTER — Telehealth: Payer: Self-pay | Admitting: *Deleted

## 2016-04-27 MED ORDER — NITROFURANTOIN MONOHYD MACRO 100 MG PO CAPS: 100.0000 mg | ORAL_CAPSULE | Freq: Two times a day (BID) | ORAL | 0 refills | Status: DC

## 2016-04-27 MED FILL — NITROFURANTOIN MONO-MCR 100: 100 | 3 days supply | Qty: 6 | Fill #0

## 2016-04-27 NOTE — Telephone Encounter (Signed)
Pt states she sent "an email" this morning concerning her UTI symptoms. I do not see any evidence of this. Pt reports she finished her Macrobid at 9 pm last night. She states that this morning her abd pain is much better, but she is still feeling pressure above her bladder and constantly feels like she needs to void. Pt states she feels like all symptoms should be gone by now. Please advise.

## 2016-04-27 NOTE — Addendum Note (Signed)
Addended by: Naaman Plummer A on: 04/27/2016 01:57 PM   Modules accepted: Orders

## 2016-04-27 NOTE — Telephone Encounter (Signed)
AWV scheduled for 05/02/16 @815 .

## 2016-04-27 NOTE — Telephone Encounter (Signed)
Please send an additional prescription for Macrobid, #6 tablets, no refills

## 2016-04-27 NOTE — Telephone Encounter (Signed)
Medication filled to pharmacy as requested.  Pt notified.

## 2016-05-01 NOTE — Progress Notes (Signed)
Pre visit review using our clinic review tool, if applicable. No additional management support is needed unless otherwise documented below in the visit note. 

## 2016-05-01 NOTE — Progress Notes (Signed)
Subjective:   Veronica Bradford is a 71 y.o. female who presents for an Initial Medicare Annual Wellness Visit.  The Patient was informed that the wellness visit is to identify future health risk and educate and initiate measures that can reduce risk for increased disease through the lifespan.    Pt expresses concern today about continued pressure in bladder after recent tx. Will discuss further with PCP at appt today.  Review of Systems    No ROS.  Medicare Wellness Visit. Cardiac Risk Factors include: advanced age (>87men, >10 women);dyslipidemia;hypertension;obesity (BMI >30kg/m2);sedentary lifestyle Sleep patterns:  Uses Benadryl to sleep 6-7 hrs.  Feels rested. Home Safety/Smoke Alarms:  Feels safe in home. Smoke alarms in place.  Living environment; residence and Firearm Safety:Lives with husband and son. Lives on 1 floor of 2 story home. Guns not discussed. Seat Belt Safety/Bike Helmet: Wears seat belt.   Counseling:   Eye Exam- Dr.Oman annually. Wears glasses. Dental- Dr.Pucket. Every 6 months.  Female:   Pap- Hysterectomy      Mammo-Last 04/28/15: BI-RADS CATEGORY  1: Negative. Ordered today. Dexa scan- Last 09/16/13: Normal.---will discuss with PCP today.  CCS-Last 01/02/13: normal per external report.     Objective:    Today's Vitals   05/02/16 0842  BP: 140/62  Pulse: 60  SpO2: 98%  Weight: 259 lb (117.5 kg)  Height: 5\' 8"  (1.727 m)   Body mass index is 39.38 kg/m.   Current Medications (verified) Outpatient Encounter Prescriptions as of 05/02/2016  Medication Sig  . acetaminophen-codeine (TYLENOL #3) 300-30 MG tablet Take 1 tablet by mouth daily as needed for moderate pain.  Marland Kitchen Alum Hydroxide-Mag Carbonate (GAVISCON PO) Take 1-2 tablets by mouth as needed (indigestion).  . Calcium Citrate-Vitamin D (CALCIUM CITRATE + PO) Take by mouth.  . cetirizine (ZYRTEC) 10 MG tablet Take 10 mg by mouth as needed for allergies.  . cholecalciferol (VITAMIN D) 1000 UNITS tablet  Take 2,000 Units by mouth every other day.  . ciprofloxacin (CIPRO) 500 MG tablet Take 500 mg by mouth as needed (prior to dental work).  . Coenzyme Q10 (CO Q 10) 10 MG CAPS Take 100 mg by mouth daily.   . diphenoxylate-atropine (LOMOTIL) 2.5-0.025 MG tablet Take 2 tablets by mouth as needed for diarrhea or loose stools.  . flecainide (TAMBOCOR) 100 MG tablet Take one tablet (100 mg) by mouth twice daily as needed for PVC's/ palpitations  . fluticasone (FLONASE) 50 MCG/ACT nasal spray Place into both nostrils as needed for allergies or rhinitis.  Marland Kitchen MAGNESIUM-OXIDE PO Take 500 mg by mouth 2 (two) times daily.  . Multiple Vitamins-Minerals (MULTIVITAMIN PO) Take 1 tablet by mouth daily.  . nitrofurantoin, macrocrystal-monohydrate, (MACROBID) 100 MG capsule Take 1 capsule (100 mg total) by mouth 2 (two) times daily.  . Omega-3 Fatty Acids (FISH OIL) 1200 MG CAPS Take 1,200 mg by mouth daily.  . propafenone (RYTHMOL) 225 MG tablet Take 2 tablets by mouth daily as needed for afib  . ranitidine (ZANTAC) 150 MG capsule Take 150 mg by mouth daily as needed for heartburn.  . rivaroxaban (XARELTO) 20 MG TABS tablet Take 1 tablet (20 mg total) by mouth daily with supper.  . simvastatin (ZOCOR) 40 MG tablet Take 1 tablet (40 mg total) by mouth at bedtime.  . valsartan-hydrochlorothiazide (DIOVAN-HCT) 320-25 MG tablet Take 1 tablet by mouth daily.  . furosemide (LASIX) 20 MG tablet Take 20 mg by mouth daily as needed for fluid.   No facility-administered encounter medications  on file as of 05/02/2016.     Allergies (verified) Nsaids; Tolmetin; Zoster vaccine live; and Amoxicillin   History: Past Medical History:  Diagnosis Date  . Back pain   . Chronic sinusitis    s/p 2 surgeries remotely, [name]  . Closed fracture of metatarsal of left foot    L foot, fifth metatarsal  . Ectopic pregnancy   . Endometrial cancer ([place])    found during hysterectomy, no chemo,XRT  . Endometrial cancer ([place]) 2004     Early diagnosis  . GERD (gastroesophageal reflux disease)   . GERD (gastroesophageal reflux disease)   . Hyperlipemia   . Hypertension   . IBS (irritable bowel syndrome) 08/13/2014   Dx 2015, diarrhea on-off, Dr [name]   . Lumbar radiculopathy 08/2012   MRI in 08/2012  . Mild depression ([place])    Pt's son has Down's Syndrome  . Mild hyperparathyroidism ([place])   . Osteopenia   . PAF (paroxysmal atrial fibrillation) ([place])   . Prediabetes   . Sciatica of right side 08/2013   Received Depomedrol 80 mg injection  . Sleep apnea 09/09/2013   Dx with OSA in 2015, by Dr. Caryl Comes, APAP   Past Surgical History:  Procedure Laterality Date  . CHOLECYSTECTOMY  ~2006  . DILATION AND CURETTAGE OF UTERUS     after SAB  . ESOPHAGOGASTRODUODENOSCOPY  02/09/2014   Dr. [name]  . FINGER SURGERY Left    index  . NASAL SINUS SURGERY     x 2 remotely, [name]  . TONSILLECTOMY    . TOTAL ABDOMINAL HYSTERECTOMY W/ BILATERAL SALPINGOOPHORECTOMY    . TOTAL KNEE ARTHROPLASTY Right ~2008   Family History  Problem Relation Age of Onset  . Hypertension Mother   . Colon cancer Mother   . Breast cancer Mother   . Ovarian cancer Mother   . Hypertension Father   . Diabetes Father   . Head & neck cancer Sister   . CAD Neg Hx    Social History   Occupational History  . 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 Smoking status: Former Smoker    Quit date: 01/06/1980  . Smokeless tobacco: Never Used  . Alcohol use Yes     Comment: rare  . Drug use: No  . Sexual activity: No    Tobacco Counseling Counseling given: Not Answered   Activities of Daily Living In your present state of health, do you have any difficulty performing the following activities: 05/02/2016 10/19/2015  Hearing? N N  Vision? N N  Difficulty concentrating or making decisions? N N  Walking or climbing stairs? N N  Dressing or bathing? N N  Doing errands, shopping? N N  Preparing Food and eating ? N -  Using the  Toilet? N -  In the past six months, have you accidently leaked urine? Y -  Do you have problems with loss of bowel control? Y -  Managing your Medications? N -  Managing your Finances? N -  Housekeeping or managing your Housekeeping? N -  Some recent data might be hidden    Immunizations and Health Maintenance Immunization History  Administered Date(s) Administered  . Influenza, High Dose Seasonal PF 10/01/2014, 10/19/2015  . Influenza-Unspecified 10/19/2010, 09/09/2013  . Pneumococcal Conjugate-13 03/07/2013  . Pneumococcal Polysaccharide-23 09/09/2013  . Tdap 07/26/2011  . Zoster 11/12/2012   Health Maintenance Due  Topic Date Due  . Hepatitis C Screening  1945-08-19  . MAMMOGRAM  04/27/2016    Patient Care Team: Colon Branch, MD as PCP - General (Internal Medicine) Marius Ditch, MD as Consulting Physician (Internal Medicine) [name], MD as Consulting Physician (Cardiology) Wylene Simmer, MD as Consulting Physician (Orthopedic Surgery)  Indicate any recent Medical Services you may have received from other than [facility] providers in the past year (date may be approximate).     Assessment:   This is a routine wellness examination for [place]. Physical assessment deferred to PCP.  Hearing/Vision screen  Hearing Screening   125Hz  250Hz  500Hz  1000Hz  2000Hz  3000Hz  4000Hz  6000Hz  8000Hz   Right ear:   Pass Pass Pass  Pass    Left ear:   Fail Pass Pass  Pass    Comments: Able to hear conversational tones w/o difficulty. No issues reported.   Left ear hearing screen discussed with pt---declines referral to audiology at this time.   Visual Acuity Screening   Right eye Left eye Both eyes  Without correction:     With correction: 20/20 20/20 20/20     Dietary issues and exercise activities discussed: Current Exercise Habits: The patient does not participate in regular exercise at present, Exercise limited by: None identified   Diet (meal preparation, eat out, water intake,  caffeinated beverages, dairy products, fruits and vegetables): in general, a "healthy" diet     Goals      Patient Stated   . Weight (lb) < 225 lb (102.1 kg) (pt-stated)          With diet and exercise       Depression Screen PHQ 2/9 Scores 05/02/2016 10/19/2015 04/14/2015 08/13/2014  PHQ - 2 Score 0 0 0 0    Fall Risk Fall Risk  05/02/2016 10/19/2015 04/14/2015 08/13/2014  Falls in the past year? No No No No    Cognitive Function: Ad8 score reviewed for issues:  Issues making decisions:no  Less interest in hobbies / activities:no  Repeats questions, stories (family complaining):no  Trouble using ordinary gadgets (microwave, computer, phone):no  Forgets the month or year: no  Mismanaging finances: no  Remembering appts:no  Daily problems with thinking and/or memory:no Ad8 score is=0        Screening Tests Health Maintenance  Topic Date Due  . Hepatitis C Screening  March 12, 1945  . MAMMOGRAM  04/27/2016  . INFLUENZA VACCINE  08/02/2016  . COLONOSCOPY  01/02/2018  . TETANUS/TDAP  07/25/2021  . DEXA SCAN  Completed  . PNA vac Low Risk Adult  Completed      Plan:     Follow up with PCP today as scheduled.  Continue to eat heart healthy diet (full of fruits, vegetables, whole grains, lean protein, water--limit salt, fat, and sugar intake) and increase physical activity as tolerated.  Continue doing brain stimulating activities (puzzles, reading, adult coloring books, staying active) to keep memory sharp.   Schedule mammogram. Order placed today.  I have personally reviewed and noted the following in the patient's chart:   . Medical and social history . Use of alcohol, tobacco or illicit drugs  . Current medications and supplements . Functional ability and status . Nutritional status . Physical activity . Advanced directives . List of other physicians . Vitals . Screenings to include cognitive, depression, and falls . Referrals and appointments  In  addition, I have reviewed and discussed with patient certain preventive protocols, quality metrics, and best practice recommendations. A written personalized care plan for preventive services as well as general preventive health recommendations were provided to patient.  Naaman Plummer [place], [place]   05/02/2016   Kathlene November, MD\

## 2016-05-02 ENCOUNTER — Encounter (HOSPITAL_BASED_OUTPATIENT_CLINIC_OR_DEPARTMENT_OTHER): Payer: Self-pay

## 2016-05-02 ENCOUNTER — Ambulatory Visit (INDEPENDENT_AMBULATORY_CARE_PROVIDER_SITE_OTHER): Payer: Medicare Other | Admitting: Internal Medicine

## 2016-05-02 ENCOUNTER — Encounter: Payer: Self-pay | Admitting: Internal Medicine

## 2016-05-02 ENCOUNTER — Ambulatory Visit (HOSPITAL_BASED_OUTPATIENT_CLINIC_OR_DEPARTMENT_OTHER)
Admission: RE | Admit: 2016-05-02 | Discharge: 2016-05-02 | Disposition: A | Discharge status: Home / Self Care | Payer: Medicare Other | Source: Ambulatory Visit | Attending: Internal Medicine | Admitting: Internal Medicine

## 2016-05-02 VITALS — BP 140/62 | HR 60 | Ht 68.0 in | Wt 259.0 lb

## 2016-05-02 DIAGNOSIS — R102 Pelvic and perineal pain: Secondary | ICD-10-CM | POA: Insufficient documentation

## 2016-05-02 DIAGNOSIS — Z1159 Encounter for screening for other viral diseases: Secondary | ICD-10-CM

## 2016-05-02 DIAGNOSIS — Z0001 Encounter for general adult medical examination with abnormal findings: Secondary | ICD-10-CM

## 2016-05-02 DIAGNOSIS — Z1231 Encounter for screening mammogram for malignant neoplasm of breast: Secondary | ICD-10-CM | POA: Diagnosis not present

## 2016-05-02 DIAGNOSIS — Z1239 Encounter for other screening for malignant neoplasm of breast: Secondary | ICD-10-CM

## 2016-05-02 DIAGNOSIS — R911 Solitary pulmonary nodule: Secondary | ICD-10-CM | POA: Insufficient documentation

## 2016-05-02 DIAGNOSIS — K5730 Diverticulosis of large intestine without perforation or abscess without bleeding: Secondary | ICD-10-CM | POA: Diagnosis not present

## 2016-05-02 DIAGNOSIS — Z0000 Encounter for general adult medical examination without abnormal findings: Secondary | ICD-10-CM

## 2016-05-02 DIAGNOSIS — K5790 Diverticulosis of intestine, part unspecified, without perforation or abscess without bleeding: Secondary | ICD-10-CM | POA: Diagnosis not present

## 2016-05-02 DIAGNOSIS — R399 Unspecified symptoms and signs involving the genitourinary system: Secondary | ICD-10-CM

## 2016-05-02 LAB — CBC WITH DIFFERENTIAL/PLATELET
BASOS PCT: 0.6 % (ref 0.0–3.0)
Basophils Absolute: 0 10*3/uL (ref 0.0–0.1)
EOS PCT: 2.7 % (ref 0.0–5.0)
Eosinophils Absolute: 0.2 10*3/uL (ref 0.0–0.7)
HEMATOCRIT: 43.9 % (ref 36.0–46.0)
Hemoglobin: 14.7 g/dL (ref 12.0–15.0)
Lymphocytes Relative: 25.2 % (ref 12.0–46.0)
Lymphs Abs: 1.5 10*3/uL (ref 0.7–4.0)
MCHC: 33.4 g/dL (ref 30.0–36.0)
MCV: 88 fl (ref 78.0–100.0)
MONOS PCT: 8.9 % (ref 3.0–12.0)
Monocytes Absolute: 0.5 10*3/uL (ref 0.1–1.0)
NEUTROS ABS: 3.8 10*3/uL (ref 1.4–7.7)
Neutrophils Relative %: 62.6 % (ref 43.0–77.0)
PLATELETS: 226 10*3/uL (ref 150.0–400.0)
RBC: 4.99 Mil/uL (ref 3.87–5.11)
RDW: 14.9 % (ref 11.5–15.5)
WBC: 6.1 10*3/uL (ref 4.0–10.5)

## 2016-05-02 LAB — COMPREHENSIVE METABOLIC PANEL
ALK PHOS: 66 U/L (ref 39–117)
ALT: 18 U/L (ref 0–35)
AST: 20 U/L (ref 0–37)
Albumin: 4 g/dL (ref 3.5–5.2)
BILIRUBIN TOTAL: 0.8 mg/dL (ref 0.2–1.2)
BUN: 14 mg/dL (ref 6–23)
CALCIUM: 10.6 mg/dL — AB (ref 8.4–10.5)
CO2: 29 mEq/L (ref 19–32)
CREATININE: 0.66 mg/dL (ref 0.40–1.20)
Chloride: 105 mEq/L (ref 96–112)
GFR: 94.55 mL/min (ref >60.00)
Glucose, Bld: 102 mg/dL — ABNORMAL HIGH (ref 70–99)
Potassium: 3.7 mEq/L (ref 3.5–5.1)
Sodium: 140 mEq/L (ref 135–145)
TOTAL PROTEIN: 6.8 g/dL (ref 6.0–8.3)

## 2016-05-02 LAB — POC URINALSYSI DIPSTICK (AUTOMATED)
Bilirubin, UA: NEGATIVE
Blood, UA: NEGATIVE
Glucose, UA: NEGATIVE
Ketones, UA: NEGATIVE
LEUKOCYTES UA: NEGATIVE
NITRITE UA: NEGATIVE
PH UA: 6 (ref 5.0–8.0)
PROTEIN UA: NEGATIVE
Spec Grav, UA: >=1.03 — AB (ref 1.010–1.025)
UROBILINOGEN UA: 0.2 U/dL

## 2016-05-02 LAB — URINALYSIS, ROUTINE W REFLEX MICROSCOPIC
Bilirubin Urine: NEGATIVE
Hgb urine dipstick: NEGATIVE
KETONES UR: NEGATIVE
Nitrite: NEGATIVE
PH: 6 (ref 5.0–8.0)
SPECIFIC GRAVITY, URINE: 1.025 (ref 1.000–1.030)
TOTAL PROTEIN, URINE-UPE24: NEGATIVE
URINE GLUCOSE: NEGATIVE
UROBILINOGEN UA: 0.2 (ref 0.0–1.0)

## 2016-05-02 LAB — MAGNESIUM: Magnesium: 2.1 mg/dL (ref 1.5–2.5)

## 2016-05-02 LAB — LIPID PANEL
CHOLESTEROL: 165 mg/dL (ref 0–200)
HDL: 55.7 mg/dL (ref >39.00)
LDL Cholesterol: 79 mg/dL (ref 0–99)
NonHDL: 109.29
TRIGLYCERIDES: 153 mg/dL — AB (ref 0.0–149.0)
Total CHOL/HDL Ratio: 3
VLDL: 30.6 mg/dL (ref 0.0–40.0)

## 2016-05-02 LAB — HEMOGLOBIN A1C: HEMOGLOBIN A1C: 5.8 % (ref 4.6–6.5)

## 2016-05-02 LAB — HEPATITIS C ANTIBODY: HCV Ab: NEGATIVE

## 2016-05-02 MED ORDER — ACETAMINOPHEN-CODEINE #3 300-30 MG PO TABS: 1.0000 | ORAL_TABLET | Freq: Every day | ORAL | 0 refills | Status: DC | PRN

## 2016-05-02 MED ORDER — IOPAMIDOL (ISOVUE-300) INJECTION 61%
100.0000 mL | Freq: Once | INTRAVENOUS | Status: AC | PRN
  Administered 2016-05-02: 100 mL via INTRAVENOUS

## 2016-05-02 MED FILL — ACETAMINOPHEN/COD #3 TABLET: 300-30 | 30 days supply | Qty: 30 | Fill #0

## 2016-05-02 NOTE — Patient Instructions (Addendum)
GO TO THE LAB : Get the blood work     GO TO THE FRONT DESK Schedule your next appointment for a  checkup in 4 weeks  ER if severe lower abdominal pain, fever, chills.        Veronica Bradford , Thank you for taking time to come for your Medicare Wellness Visit. I appreciate your ongoing commitment to your health goals. Please review the following plan we discussed and let me know if I can assist you in the future.   These are the goals we discussed: Goals      Patient Stated   . Weight (lb) < 225 lb (102.1 kg) (pt-stated)          With diet and exercise        This is a list of the screening recommended for you and due dates:  Health Maintenance  Topic Date Due  .  Hepatitis C: One time screening is recommended by Center for Disease Control  (CDC) for  adults born from [birth year] through 1963.   1945/02/01  . Mammogram  04/27/2016  . Flu Shot  08/02/2016  . Colon Cancer Screening  01/02/2018  . Tetanus Vaccine  07/25/2021  . DEXA scan (bone density measurement)  Completed  . Pneumonia vaccines  Completed   Continue to eat heart healthy diet (full of fruits, vegetables, whole grains, lean protein, water--limit salt, fat, and sugar intake) and increase physical activity as tolerated.  Continue doing brain stimulating activities (puzzles, reading, adult coloring books, staying active) to keep memory sharp.   Schedule mammogram. Order placed today.   Health Maintenance, Female Adopting a healthy lifestyle and getting preventive care can go a long way to promote health and wellness. Talk with your health care provider about what schedule of regular examinations is right for you. This is a good chance for you to check in with your provider about disease prevention and staying healthy. In between checkups, there are plenty of things you can do on your own. Experts have done a lot of research about which lifestyle changes and preventive measures are most likely to keep you healthy. Ask  your health care provider for more information. Weight and diet Eat a healthy diet  Be sure to include plenty of vegetables, fruits, low-fat dairy products, and lean protein.  Do not eat a lot of foods high in solid fats, added sugars, or salt.  Get regular exercise. This is one of the most important things you can do for your health.  Most adults should exercise for at least 150 minutes each week. The exercise should increase your heart rate and make you sweat (moderate-intensity exercise).  Most adults should also do strengthening exercises at least twice a week. This is in addition to the moderate-intensity exercise. Maintain a healthy weight  Body mass index (BMI) is a measurement that can be used to identify possible weight problems. It estimates body fat based on height and weight. Your health care provider can help determine your BMI and help you achieve or maintain a healthy weight.  For females 55 years of age and older:  A BMI below 18.5 is considered underweight.  A BMI of 18.5 to 24.9 is normal.  A BMI of 25 to 29.9 is considered overweight.  A BMI of 30 and above is considered obese. Watch levels of cholesterol and blood lipids  You should start having your blood tested for lipids and cholesterol at 69 years of age, then have this test  every 5 years.  You may need to have your cholesterol levels checked more often if:  Your lipid or cholesterol levels are high.  You are older than 69 years of age.  You are at high risk for heart disease. Cancer screening Lung Cancer  Lung cancer screening is recommended for adults 42-18 years old who are at high risk for lung cancer because of a history of smoking.  A yearly low-dose CT scan of the lungs is recommended for people who:  Currently smoke.  Have quit within the past 15 years.  Have at least a 30-pack-year history of smoking. A pack year is smoking an average of one pack of cigarettes a day for 1  year.  Yearly screening should continue until it has been 15 years since you quit.  Yearly screening should stop if you develop a health problem that would prevent you from having lung cancer treatment. Breast Cancer  Practice breast self-awareness. This means understanding how your breasts normally appear and feel.  It also means doing regular breast self-exams. Let your health care provider know about any changes, no matter how small.  If you are in your 20s or 30s, you should have a clinical breast exam (CBE) by a health care provider every 1-3 years as part of a regular health exam.  If you are 14 or older, have a CBE every year. Also consider having a breast X-ray (mammogram) every year.  If you have a family history of breast cancer, talk to your health care provider about genetic screening.  If you are at high risk for breast cancer, talk to your health care provider about having an MRI and a mammogram every year.  Breast cancer gene (BRCA) assessment is recommended for women who have family members with BRCA-related cancers. BRCA-related cancers include:  Breast.  Ovarian.  Tubal.  Peritoneal cancers.  Results of the assessment will determine the need for genetic counseling and BRCA1 and BRCA2 testing. Cervical Cancer  Your health care provider may recommend that you be screened regularly for cancer of the pelvic organs (ovaries, uterus, and vagina). This screening involves a pelvic examination, including checking for microscopic changes to the surface of your cervix (Pap test). You may be encouraged to have this screening done every 3 years, beginning at age 15.  For women ages 67-65, health care providers may recommend pelvic exams and Pap testing every 3 years, or they may recommend the Pap and pelvic exam, combined with testing for human papilloma virus (HPV), every 5 years. Some types of HPV increase your risk of cervical cancer. Testing for HPV may also be done on women  of any age with unclear Pap test results.  Other health care providers may not recommend any screening for nonpregnant women who are considered low risk for pelvic cancer and who do not have symptoms. Ask your health care provider if a screening pelvic exam is right for you.  If you have had past treatment for cervical cancer or a condition that could lead to cancer, you need Pap tests and screening for cancer for at least 20 years after your treatment. If Pap tests have been discontinued, your risk factors (such as having a new sexual partner) need to be reassessed to determine if screening should resume. Some women have medical problems that increase the chance of getting cervical cancer. In these cases, your health care provider may recommend more frequent screening and Pap tests. Colorectal Cancer  This type of cancer can be  detected and often prevented.  Routine colorectal cancer screening usually begins at 69 years of age and continues through 69 years of age.  Your health care provider may recommend screening at an earlier age if you have risk factors for colon cancer.  Your health care provider may also recommend using home test kits to check for hidden blood in the stool.  A small camera at the end of a tube can be used to examine your colon directly (sigmoidoscopy or colonoscopy). This is done to check for the earliest forms of colorectal cancer.  Routine screening usually begins at age 59.  Direct examination of the colon should be repeated every 5-10 years through 69 years of age. However, you may need to be screened more often if early forms of precancerous polyps or small growths are found. Skin Cancer  Check your skin from head to toe regularly.  Tell your health care provider about any new moles or changes in moles, especially if there is a change in a mole's shape or color.  Also tell your health care provider if you have a mole that is larger than the size of a pencil  eraser.  Always use sunscreen. Apply sunscreen liberally and repeatedly throughout the day.  Protect yourself by wearing long sleeves, pants, a wide-brimmed hat, and sunglasses whenever you are outside. Heart disease, diabetes, and high blood pressure  High blood pressure causes heart disease and increases the risk of stroke. High blood pressure is more likely to develop in:  People who have blood pressure in the high end of the normal range (130-139/85-89 mm Hg).  People who are overweight or obese.  People who are African American.  If you are 92-62 years of age, have your blood pressure checked every 3-5 years. If you are 26 years of age or older, have your blood pressure checked every year. You should have your blood pressure measured twice-once when you are at a hospital or clinic, and once when you are not at a hospital or clinic. Record the average of the two measurements. To check your blood pressure when you are not at a hospital or clinic, you can use:  An automated blood pressure machine at a pharmacy.  A home blood pressure monitor.  If you are between 75 years and 7 years old, ask your health care provider if you should take aspirin to prevent strokes.  Have regular diabetes screenings. This involves taking a blood sample to check your fasting blood sugar level.  If you are at a normal weight and have a low risk for diabetes, have this test once every three years after 69 years of age.  If you are overweight and have a high risk for diabetes, consider being tested at a younger age or more often. Preventing infection Hepatitis B  If you have a higher risk for hepatitis B, you should be screened for this virus. You are considered at high risk for hepatitis B if:  You were born in a country where hepatitis B is common. Ask your health care provider which countries are considered high risk.  Your parents were born in a high-risk country, and you have not been immunized  against hepatitis B (hepatitis B vaccine).  You have HIV or AIDS.  You use needles to inject street drugs.  You live with someone who has hepatitis B.  You have had sex with someone who has hepatitis B.  You get hemodialysis treatment.  You take certain medicines for conditions,  including cancer, organ transplantation, and autoimmune conditions. Hepatitis C  Blood testing is recommended for:  Everyone born from [birth year] through 1963.  Anyone with known risk factors for hepatitis C. Sexually transmitted infections (STIs)  You should be screened for sexually transmitted infections (STIs) including gonorrhea and chlamydia if:  You are sexually active and are younger than 68 years of age.  You are older than 69 years of age and your health care provider tells you that you are at risk for this type of infection.  Your sexual activity has changed since you were last screened and you are at an increased risk for chlamydia or gonorrhea. Ask your health care provider if you are at risk.  If you do not have HIV, but are at risk, it may be recommended that you take a prescription medicine daily to prevent HIV infection. This is called pre-exposure prophylaxis (PrEP). You are considered at risk if:  You are sexually active and do not regularly use condoms or know the HIV status of your partner(s).  You take drugs by injection.  You are sexually active with a partner who has HIV. Talk with your health care provider about whether you are at high risk of being infected with HIV. If you choose to begin PrEP, you should first be tested for HIV. You should then be tested every 3 months for as long as you are taking PrEP. Pregnancy  If you are premenopausal and you may become pregnant, ask your health care provider about preconception counseling.  If you may become pregnant, take 400 to 800 micrograms (mcg) of folic acid every day.  If you want to prevent pregnancy, talk to your health care  provider about birth control (contraception). Osteoporosis and menopause  Osteoporosis is a disease in which the bones lose minerals and strength with aging. This can result in serious bone fractures. Your risk for osteoporosis can be identified using a bone density scan.  If you are 41 years of age or older, or if you are at risk for osteoporosis and fractures, ask your health care provider if you should be screened.  Ask your health care provider whether you should take a calcium or vitamin D supplement to lower your risk for osteoporosis.  Menopause may have certain physical symptoms and risks.  Hormone replacement therapy may reduce some of these symptoms and risks. Talk to your health care provider about whether hormone replacement therapy is right for you. Follow these instructions at home:  Schedule regular health, dental, and eye exams.  Stay current with your immunizations.  Do not use any tobacco products including cigarettes, chewing tobacco, or electronic cigarettes.  If you are pregnant, do not drink alcohol.  If you are breastfeeding, limit how much and how often you drink alcohol.  Limit alcohol intake to no more than 1 drink per day for nonpregnant women. One drink equals 12 ounces of beer, 5 ounces of wine, or 1 ounces of hard liquor.  Do not use street drugs.  Do not share needles.  Ask your health care provider for help if you need support or information about quitting drugs.  Tell your health care provider if you often feel depressed.  Tell your health care provider if you have ever been abused or do not feel safe at home. This information is not intended to replace advice given to you by your health care provider. Make sure you discuss any questions you have with your health care provider. Document Released: 07/04/2010 Document  Revised: 05/27/2015 Document Reviewed: 09/22/2014 Elsevier Interactive Patient Education  2017 [employer].

## 2016-05-02 NOTE — Progress Notes (Signed)
Subjective:    Patient ID: Veronica Bradford, female    DOB: 1945-08-26, 71 y.o.   MRN: [id number]  DOS:  05/02/2016 Type of visit - description : CPX Interval history: Chronic medical issues discussed   Review of Systems Was seen with the suprapubic discomfort, urinary symptoms, urine culture showed enterococcus. Was prescribed Bactrim and subsequently switched to Macrobid ( 3-day rounds x 2 ). She is not completely well. She still has some suprapubic pressure and intermittent left lower quadrant discomfort. No dysuria or gross hematuria. Occasional postvoiding spasm at the suprapubic area.  Specifically asked about the following: No fever chills. No chest pain or difficulty breathing No nausea, vomiting, blood in the stools. Stools are soft consistent with IBS Occasionally heartburn. No headaches   Other than above, a 14 point review of systems is negative     Past Medical History:  Diagnosis Date  . Back pain   . Chronic sinusitis    s/p 2 surgeries remotely, [name]  . Closed fracture of metatarsal of left foot    L foot, fifth metatarsal  . Ectopic pregnancy   . Endometrial cancer ([place])    found during hysterectomy, no chemo,XRT  . Endometrial cancer ([place]) 2004   Early diagnosis  . GERD (gastroesophageal reflux disease)   . GERD (gastroesophageal reflux disease)   . Hyperlipemia   . Hypertension   . IBS (irritable bowel syndrome) 08/13/2014   Dx 2015, diarrhea on-off, Dr [name]   . Lumbar radiculopathy 08/2012   MRI in 08/2012  . Mild depression ([place])    Pt's son has Down's Syndrome  . Mild hyperparathyroidism ([place])   . Osteopenia   . PAF (paroxysmal atrial fibrillation) ([place])   . Prediabetes   . Sciatica of right side 08/2013   Received Depomedrol 80 mg injection  . Sleep apnea 09/09/2013   Dx with OSA in 2015, by Dr. Caryl Comes, APAP    Past Surgical History:  Procedure Laterality Date  . ABDOMINAL HYSTERECTOMY    . CHOLECYSTECTOMY  ~2006  . DILATION AND  CURETTAGE OF UTERUS     after SAB  . ESOPHAGOGASTRODUODENOSCOPY  02/09/2014   Dr. [name]  . FINGER SURGERY Left    index  . NASAL SINUS SURGERY     x 2 remotely, [name]  . TONSILLECTOMY    . TOTAL ABDOMINAL HYSTERECTOMY W/ BILATERAL SALPINGOOPHORECTOMY    . TOTAL KNEE ARTHROPLASTY Right ~2008    Social History   Social History  . Marital status: Married    Spouse name: N/A  . Number of children: 1  . Years of education: N/A   Occupational History  . 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 Smoking status: Former Smoker    Quit date: 01/06/1980  . Smokeless tobacco: Never Used  . Alcohol use Yes     Comment: rare  . Drug use: No  . Sexual activity: No   Other Topics Concern  . Not on file   Social History Narrative   Lives w/ husband, and son [name]     Family History  Problem Relation Age of Onset  . Hypertension Mother   . Colon cancer Mother   . Breast cancer Mother   . Ovarian cancer Mother   . Hypertension Father   . Diabetes Father   . Head & neck cancer Sister   . CAD Neg Hx      Allergies as of 05/02/2016  Reactions   Nsaids Other (See Comments)   Pt on Xarelto   Tolmetin Other (See Comments)   Pt on Xarelto   Zoster Vaccine Live Other (See Comments)   Sickness per Pt.    Amoxicillin Rash      Medication List       Accurate as of 05/02/16 11:59 PM. Always use your most recent med list.          acetaminophen-codeine 300-30 MG tablet Commonly known as:  TYLENOL #3 Take 1 tablet by mouth daily as needed for moderate pain.   CALCIUM CITRATE + PO Take by mouth.   cetirizine 10 MG tablet Commonly known as:  ZYRTEC Take 10 mg by mouth as needed for allergies.   cholecalciferol 1000 units tablet Commonly known as:  VITAMIN D Take 2,000 Units by mouth every other day.   ciprofloxacin 500 MG tablet Commonly known as:  CIPRO Take 500 mg by mouth as needed (prior to dental work).   Co Q 10 10 MG Caps Take 100 mg by  mouth daily.   diphenoxylate-atropine 2.5-0.025 MG tablet Commonly known as:  LOMOTIL Take 2 tablets by mouth as needed for diarrhea or loose stools.   Fish Oil 1200 MG Caps Take 1,200 mg by mouth daily.   flecainide 100 MG tablet Commonly known as:  TAMBOCOR Take one tablet (100 mg) by mouth twice daily as needed for PVC's/ palpitations   fluticasone 50 MCG/ACT nasal spray Commonly known as:  FLONASE Place into both nostrils as needed for allergies or rhinitis.   furosemide 20 MG tablet Commonly known as:  LASIX Take 20 mg by mouth daily as needed for fluid.   GAVISCON PO Take 1-2 tablets by mouth as needed (indigestion).   MAGNESIUM-OXIDE PO Take 500 mg by mouth 2 (two) times daily.   MULTIVITAMIN PO Take 1 tablet by mouth daily.   nitrofurantoin (macrocrystal-monohydrate) 100 MG capsule Commonly known as:  MACROBID Take 1 capsule (100 mg total) by mouth 2 (two) times daily.   propafenone 225 MG tablet Commonly known as:  RYTHMOL Take 2 tablets by mouth daily as needed for afib   ranitidine 150 MG capsule Commonly known as:  ZANTAC Take 150 mg by mouth daily as needed for heartburn.   rivaroxaban 20 MG Tabs tablet Commonly known as:  XARELTO Take 1 tablet (20 mg total) by mouth daily with supper.   simvastatin 40 MG tablet Commonly known as:  ZOCOR Take 1 tablet (40 mg total) by mouth at bedtime.   valsartan-hydrochlorothiazide 320-25 MG tablet Commonly known as:  DIOVAN-HCT Take 1 tablet by mouth daily.          Objective:   Physical Exam BP 140/62 (BP Location: Right Arm, Patient Position: Sitting, Cuff Size: Large)   Pulse 60   Ht 5\' 8"  (1.727 m)   Wt 259 lb (117.5 kg)   SpO2 98%   BMI 39.38 kg/m   General:   Well developed, well nourished . NAD.  Neck: No  thyromegaly  HEENT:  Normocephalic . Face symmetric, atraumatic Lungs:  CTA B Normal respiratory effort, no intercostal retractions, no accessory muscle use. Heart: RRR,  no murmur.    No pretibial edema bilaterally  Abdomen:  Not distended, soft, non-tender. No rebound or rigidity.   Skin: Exposed areas without rash. Not pale. Not jaundice Neurologic:  alert & oriented X3.  Speech normal, gait appropriate for age and unassisted Strength symmetric and appropriate for age.  Psych: Cognition and judgment  appear intact.  Cooperative with normal attention span and concentration.  Behavior appropriate. No anxious or depressed appearing.    Assessment & Plan:   Assessment: Prediabetes HTN Hyperlipidemia Chronic lower extremity edema L>R (eval by previous pcp, related to varicose veins?) CV:  Paroxysmal atrial fibrillation: on xarelto and Rythmol----> pill-in-pocket PVCs -- flecainide prn GI: Dr [name]  --GERD (zantac or protonix prn), IBS, chronic diarrhea --GI records: [place], 2004, 2009 8 and 07-2013. Negative for polyps or IBD. + Diverticuli. Next 2020 due to [place] OSA--- CPAP, Dr. Maxwell Caul Osteopenia:  Multiple  Dexas, last two:  T score 2009  -1.1 T score 2015  -0.7 DJD --Chronic back pain-- tylenol #3 prn --- RF per PCP --history of epidurals in the past, MRI 2014: Scoliosis,DJD --CIPRO pre dental d/t knee replacement (rx by ortho) H/o endometrial cancer, found during a hysterectomy, no chemotherapy or XRT. Pap smear 04-2014 within normal per patient   PLAN: HTN: Seems well-controlled on Lasix Hyperlipidemia: Checking labs, on simvastatin Paroxysmal A. fib: On Xarelto, seems to be doing well, also check Mg levels per pt request  GERD: Occasional sxs, alternating between Zantac and Protonix. Chronic back pain: Refill Tylenol 3 LLQ, suprapubic discomfort, LUTS: See last visit, UCX (+) , s/p Bactrim and subsequently switched to [place] ( 3-day rounds x 2 ). Sxs are not completely resolved. Udip (-). Plan: UA, urine culture, CT abdomen and pelvis to rule out other conditions (h/o endometrial ca, diverticulitis?, others?) RTC 4 weeks

## 2016-05-02 NOTE — Assessment & Plan Note (Addendum)
--  Td 2013; pnm shot 2015, prevnar 2015; zostavax 2013 (had a severe HA after zostavax): shingrex discussed , reluctant to proceed d/t s/e from zostavax --Female care: per gyn PAP 12- 2017 (-) MMG--04-2015 (-) --CCS: last cscope 2015, next 2020 d/t FH --Diet and exercise discussed --Labs: CMP, FLP, CBC, A1c, UA, urine culture. Hep C. Mg

## 2016-05-03 LAB — URINE CULTURE: Organism ID, Bacteria: NO GROWTH

## 2016-05-03 NOTE — Assessment & Plan Note (Signed)
HTN: Seems well-controlled on Lasix Hyperlipidemia: Checking labs, on simvastatin Paroxysmal A. fib: On Xarelto, seems to be doing well, also check Mg levels per pt request  GERD: Occasional sxs, alternating between Zantac and Protonix. Chronic back pain: Refill Tylenol 3 LLQ, suprapubic discomfort, LUTS: See last visit, UCX (+) , s/p Bactrim and subsequently switched to [place] ( 3-day rounds x 2 ). Sxs are not completely resolved. Udip (-). Plan: UA, urine culture, CT abdomen and pelvis to rule out other conditions (h/o endometrial ca, diverticulitis?, others?) RTC 4 weeks

## 2016-05-15 DIAGNOSIS — H35373 Puckering of macula, bilateral: Secondary | ICD-10-CM | POA: Diagnosis not present

## 2016-05-17 ENCOUNTER — Ambulatory Visit (INDEPENDENT_AMBULATORY_CARE_PROVIDER_SITE_OTHER): Payer: Medicare Other | Admitting: Internal Medicine

## 2016-05-17 ENCOUNTER — Encounter: Payer: Self-pay | Admitting: Internal Medicine

## 2016-05-17 VITALS — BP 122/74 | HR 63 | Ht 68.0 in | Wt 263.0 lb

## 2016-05-17 DIAGNOSIS — R001 Bradycardia, unspecified: Secondary | ICD-10-CM | POA: Diagnosis not present

## 2016-05-17 DIAGNOSIS — I493 Ventricular premature depolarization: Secondary | ICD-10-CM | POA: Diagnosis not present

## 2016-05-17 DIAGNOSIS — I480 Paroxysmal atrial fibrillation: Secondary | ICD-10-CM

## 2016-05-17 NOTE — Progress Notes (Signed)
Patient Care Team: Colon Branch, MD as PCP - General (Internal Medicine) Marius Ditch, MD as Consulting Physician (Internal Medicine) [name], MD as Consulting Physician (Cardiology) Wylene Simmer, MD as Consulting Physician (Orthopedic Surgery)   HPI  Veronica Bradford is a 71 y.o. female Seen in followup for AFib and now PVCs. Event recorder   demonstrated 0-14% PVCs (monomorphic and  nonsustained ventricular tachycardia.  She notes no clear association with her irritable bowel or other things with her PVCs. She has eliminated caffeine from her diet. She is becoming teetotaler  PVC episodes are infrequent but recurring daily. Atrial fibrillation has been sporadic. She try taking propafenone without restoration of sinus. She did try the flecainide again which worked better and was not associated with nausea and vomiting as before.  anticoagulation with Rivaroxaban and without bleeding issues  PVCs also improved with supplemental magnesium  Date Cr Hgb Cr Mag  5/18 0.66 14.7 3.7 2.1           She has chronic mild peripheral edema and mild DOE. No chest pain.  She takes a diuretic when necessary  Echo >> normal LV function with LVH  With normal LV                Past Medical History:  Diagnosis Date  . Back pain   . Chronic sinusitis    s/p 2 surgeries remotely, [name]  . Closed fracture of metatarsal of left foot    L foot, fifth metatarsal  . Ectopic pregnancy   . Endometrial cancer ([place])    found during hysterectomy, no chemo,XRT  . Endometrial cancer ([place]) 2004   Early diagnosis  . GERD (gastroesophageal reflux disease)   . GERD (gastroesophageal reflux disease)   . Hyperlipemia   . Hypertension   . IBS (irritable bowel syndrome) 08/13/2014   Dx 2015, diarrhea on-off, Dr [name]   . Lumbar radiculopathy 08/2012   MRI in 08/2012  . Mild depression ([place])    Pt's son has Down's Syndrome  . Mild hyperparathyroidism ([place])   . Osteopenia   .  PAF (paroxysmal atrial fibrillation) ([place])   . Prediabetes   . Sciatica of right side 08/2013   Received Depomedrol 80 mg injection  . Sleep apnea 09/09/2013   Dx with OSA in 2015, by Dr. Caryl Comes, APAP    Past Surgical History:  Procedure Laterality Date  . ABDOMINAL HYSTERECTOMY    . CHOLECYSTECTOMY  ~2006  . DILATION AND CURETTAGE OF UTERUS     after SAB  . ESOPHAGOGASTRODUODENOSCOPY  02/09/2014   Dr. [name]  . FINGER SURGERY Left    index  . NASAL SINUS SURGERY     x 2 remotely, [name]  . TONSILLECTOMY    . TOTAL ABDOMINAL HYSTERECTOMY W/ BILATERAL SALPINGOOPHORECTOMY    . TOTAL KNEE ARTHROPLASTY Right ~2008    Current Outpatient Prescriptions  Medication Sig Dispense Refill  . acetaminophen-codeine (TYLENOL #3) 300-30 MG tablet Take 1 tablet by mouth daily as needed for moderate pain. 30 tablet 0  . Alum Hydroxide-Mag Carbonate (GAVISCON PO) Take 1-2 tablets by mouth as needed (indigestion).    . Calcium Citrate-Vitamin D (CALCIUM CITRATE + PO) Take by mouth.    . cetirizine (ZYRTEC) 10 MG tablet Take 10 mg by mouth as needed for allergies.    . cholecalciferol (VITAMIN D) 1000 UNITS tablet Take 2,000 Units by mouth every other day.    Marland Kitchen CINNAMON PO Take  0.5 tsp by mouth twice daily    . ciprofloxacin (CIPRO) 500 MG tablet Take 500 mg by mouth as needed (prior to dental work).    . Coenzyme Q10 (CO Q 10) 10 MG CAPS Take 100 mg by mouth daily.     Marland Kitchen CRANBERRY PO Take 1 capsule by mouth daily.    . diphenoxylate-atropine (LOMOTIL) 2.5-0.025 MG tablet Take 2 tablets by mouth as needed for diarrhea or loose stools.    . flecainide (TAMBOCOR) 100 MG tablet Take one tablet (100 mg) by mouth twice daily as needed for PVC's/ palpitations    . fluticasone (FLONASE) 50 MCG/ACT nasal spray Place into both nostrils as needed for allergies or rhinitis.    . furosemide (LASIX) 20 MG tablet Take 20 mg by mouth daily as needed for fluid.    Marland Kitchen MAGNESIUM-OXIDE PO Take 500 mg by mouth 2 (two)  times daily.    . Multiple Vitamins-Minerals (MULTIVITAMIN PO) Take 1 tablet by mouth daily.    . nitrofurantoin, macrocrystal-monohydrate, (MACROBID) 100 MG capsule Take 1 capsule (100 mg total) by mouth 2 (two) times daily. 6 capsule 0  . Omega-3 Fatty Acids (FISH OIL) 1200 MG CAPS Take 1,200 mg by mouth daily.    . propafenone (RYTHMOL) 225 MG tablet Take 2 tablets by mouth daily as needed for afib 90 tablet 3  . ranitidine (ZANTAC) 150 MG capsule Take 150 mg by mouth daily as needed for heartburn.    . rivaroxaban (XARELTO) 20 MG TABS tablet Take 1 tablet (20 mg total) by mouth daily with supper. 90 tablet 3  . simvastatin (ZOCOR) 40 MG tablet Take 1 tablet (40 mg total) by mouth at bedtime. 90 tablet 0  . valsartan-hydrochlorothiazide (DIOVAN-HCT) 320-25 MG tablet Take 1 tablet by mouth daily. 90 tablet 0   No current facility-administered medications for this visit.     Allergies  Allergen Reactions  . Nsaids Other (See Comments)    Pt on Xarelto  . Tolmetin Other (See Comments)    Pt on Xarelto  . Zoster Vaccine Live Other (See Comments)    Sickness per Pt.   Marland Kitchen Amoxicillin Rash    Review of Systems negative except from HPI and PMH  Physical Exam BP 122/74   Pulse 63   Ht 5\' 8"  (1.727 m)   Wt 263 lb (119.3 kg)   SpO2 99%   BMI 39.99 kg/m  Well developed and nourished in no acute distress HENT normal Neck supple with JVP-flat Clear No bruit Regular rate and rhythm, no murmurs or gallops Abd-soft with active BS No Clubbing cyanosis tr  edema Skin-warm and dry A & Oriented  Grossly normal sensory and motor function  ECG demonstrates sinus rhythm at 56 Intervals 19/16/46 Right bundle branch block  Assessment and  Plan  Atrial fibrillation-paroxysmal   CHADS-VASc score 2 (age/gender)    Obesity  PVCs  Sinus bradycardia  On Anticoagulation;  No bleeding issues   We will continue when necessary flecainide for atrial fibrillation and continue magnesium for  PVCs controlled  She is continuing to work on weight loss.  We spent more than 50% of our >25 min visit in face to face counseling regarding the above

## 2016-05-17 NOTE — Patient Instructions (Addendum)
Medication Instructions:  Your physician recommends that you continue on your current medications as directed. Please refer to the Current Medication list given to you today.  Labwork: None ordered.  Testing/Procedures: None ordered.  Follow-Up: Your physician wants you to follow-up in: 6 months with Renee Ursuy, PA. You will receive a reminder letter in the mail two months in advance. If you don't receive a letter, please call our office to schedule the follow-up appointment.   Any Other Special Instructions Will Be Listed Below (If Applicable).     If you need a refill on your cardiac medications before your next appointment, please call your pharmacy.  

## 2016-05-22 DIAGNOSIS — N390 Urinary tract infection, site not specified: Secondary | ICD-10-CM | POA: Diagnosis not present

## 2016-05-30 ENCOUNTER — Ambulatory Visit (INDEPENDENT_AMBULATORY_CARE_PROVIDER_SITE_OTHER): Payer: Medicare Other | Admitting: Internal Medicine

## 2016-05-30 ENCOUNTER — Encounter: Payer: Self-pay | Admitting: Internal Medicine

## 2016-05-30 DIAGNOSIS — Z09 Encounter for follow-up examination after completed treatment for conditions other than malignant neoplasm: Secondary | ICD-10-CM

## 2016-05-30 DIAGNOSIS — R1032 Left lower quadrant pain: Secondary | ICD-10-CM | POA: Diagnosis not present

## 2016-05-30 NOTE — Assessment & Plan Note (Addendum)
Hyperglycemia: CBGs in the 80s - 90s with cinnamon, patient wonder if this is safe to take large amount of supplements. I'm not sure. I still recommend a healthy diet, exercise as the primary way to manage her CBGs. LLQ pain, LUTS: CT scan was negative, saw urology, no pathology found, felt to be irritable bladder. Was recommended dietary changes and Azo-Standard OTC, feeling better  Hypercalcemia: Chronic for more than 10 years per patient. Will check ionized calcium and PTH. She is currently on calcium 1000 mg daily and vitamin D approximately 1300 units. RTC 6 months.

## 2016-05-30 NOTE — Patient Instructions (Signed)
GO TO THE LAB : Get the blood work     GO TO THE FRONT DESK Schedule your next appointment for a  Check up in 6 months   

## 2016-05-30 NOTE — Progress Notes (Signed)
Subjective:    Patient ID: Veronica Bradford, female    DOB: 04-Jan-1945, 71 y.o.   MRN: [id number]  DOS:  05/30/2016 Type of visit - description : f/u Interval history: Since the last visit, CT abdomen was negative, saw urology, note reviewed . LUTS: sx better after dietary changes  Saw cards, note reviewed, stable   Review of Systems Overall feels well, check her CBGs 2 to 3 times a week, fasting. When she takes cinnamon supplement CBGs are in the 80s or 90s.  Past Medical History:  Diagnosis Date  . Back pain   . Chronic sinusitis    s/p 2 surgeries remotely, [name]  . Closed fracture of metatarsal of left foot    L foot, fifth metatarsal  . Ectopic pregnancy   . Endometrial cancer ([place])    found during hysterectomy, no chemo,XRT  . Endometrial cancer ([place]) 2004   Early diagnosis  . GERD (gastroesophageal reflux disease)   . GERD (gastroesophageal reflux disease)   . Hyperlipemia   . Hypertension   . IBS (irritable bowel syndrome) 08/13/2014   Dx 2015, diarrhea on-off, Dr [name]   . Lumbar radiculopathy 08/2012   MRI in 08/2012  . Mild depression ([place])    Pt's son has Down's Syndrome  . Mild hyperparathyroidism ([place])   . Osteopenia   . PAF (paroxysmal atrial fibrillation) ([place])   . Prediabetes   . Sciatica of right side 08/2013   Received Depomedrol 80 mg injection  . Sleep apnea 09/09/2013   Dx with OSA in 2015, by Dr. Caryl Comes, APAP    Past Surgical History:  Procedure Laterality Date  . ABDOMINAL HYSTERECTOMY    . CHOLECYSTECTOMY  ~2006  . DILATION AND CURETTAGE OF UTERUS     after SAB  . ESOPHAGOGASTRODUODENOSCOPY  02/09/2014   Dr. [name]  . FINGER SURGERY Left    index  . NASAL SINUS SURGERY     x 2 remotely, [name]  . TONSILLECTOMY    . TOTAL ABDOMINAL HYSTERECTOMY W/ BILATERAL SALPINGOOPHORECTOMY    . TOTAL KNEE ARTHROPLASTY Right ~2008    Social History   Social History  . Marital status: Married    Spouse name: N/A  . Number of children: 1  .  Years of education: N/A   Occupational History  . 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 Smoking status: Former Smoker    Quit date: 01/06/1980  . Smokeless tobacco: Never Used  . Alcohol use Yes     Comment: rare  . Drug use: No  . Sexual activity: No   Other Topics Concern  . Not on file   Social History Narrative   Lives w/ husband, and son [name]      Allergies as of 05/30/2016      Reactions   Nsaids Other (See Comments)   Pt on Xarelto   Tolmetin Other (See Comments)   Pt on Xarelto   Zoster Vaccine Live Other (See Comments)   Sickness per Pt.    Amoxicillin Rash      Medication List       Accurate as of 05/30/16  2:53 PM. Always use your most recent med list.          acetaminophen-codeine 300-30 MG tablet Commonly known as:  TYLENOL #3 Take 1 tablet by mouth daily as needed for moderate pain.   CALCIUM CITRATE + PO Take by mouth.   cetirizine 10 MG tablet Commonly known  as:  ZYRTEC Take 10 mg by mouth as needed for allergies.   cholecalciferol 1000 units tablet Commonly known as:  VITAMIN D Take 2,000 Units by mouth every other day.   CINNAMON PO Take 0.5 tsp by mouth twice daily   ciprofloxacin 500 MG tablet Commonly known as:  CIPRO Take 500 mg by mouth as needed (prior to dental work).   Co Q 10 10 MG Caps Take 100 mg by mouth daily.   CRANBERRY PO Take 1 capsule by mouth daily.   diphenoxylate-atropine 2.5-0.025 MG tablet Commonly known as:  LOMOTIL Take 2 tablets by mouth as needed for diarrhea or loose stools.   Fish Oil 1200 MG Caps Take 1,200 mg by mouth daily.   flecainide 100 MG tablet Commonly known as:  TAMBOCOR Take one tablet (100 mg) by mouth twice daily as needed for PVC's/ palpitations   fluticasone 50 MCG/ACT nasal spray Commonly known as:  FLONASE Place into both nostrils as needed for allergies or rhinitis.   furosemide 20 MG tablet Commonly known as:  LASIX Take 20 mg by mouth daily as needed  for fluid.   GAVISCON PO Take 1-2 tablets by mouth as needed (indigestion).   MAGNESIUM-OXIDE PO Take 500 mg by mouth 2 (two) times daily.   MULTIVITAMIN PO Take 1 tablet by mouth daily.   propafenone 225 MG tablet Commonly known as:  RYTHMOL Take 2 tablets by mouth daily as needed for afib   ranitidine 150 MG capsule Commonly known as:  ZANTAC Take 150 mg by mouth daily as needed for heartburn.   rivaroxaban 20 MG Tabs tablet Commonly known as:  XARELTO Take 1 tablet (20 mg total) by mouth daily with supper.   simvastatin 40 MG tablet Commonly known as:  ZOCOR Take 1 tablet (40 mg total) by mouth at bedtime.   valsartan-hydrochlorothiazide 320-25 MG tablet Commonly known as:  DIOVAN-HCT Take 1 tablet by mouth daily.          Objective:   Physical Exam BP 126/74 (BP Location: Left Arm, Patient Position: Sitting, Cuff Size: Normal)   Pulse 60   Temp 97.6 F (36.4 C) (Oral)   Resp 14   Ht 5\' 8"  (1.727 m)   Wt 261 lb 6 oz (118.6 kg)   SpO2 97%   BMI 39.74 kg/m  General:   Well developed, well nourished . NAD.  HEENT:  Normocephalic . Face symmetric, atraumatic Neck - no mass  Lungs:  CTA B Normal respiratory effort, no intercostal retractions, no accessory muscle use. Heart: RRR,  no murmur.  No pretibial edema bilaterally  Skin: Not pale. Not jaundice Neurologic:  alert & oriented X3.  Speech normal, gait appropriate for age and unassisted Psych--  Cognition and judgment appear intact.  Cooperative with normal attention span and concentration.  Behavior appropriate. No anxious or depressed appearing.      Assessment & Plan:  Assessment: Prediabetes HTN Hyperlipidemia Chronic lower extremity edema L>R (eval by previous pcp, related to varicose veins?) CV:  Paroxysmal atrial fibrillation: on xarelto and Rythmol----> pill-in-pocket PVCs -- flecainide prn GI: Dr [name]  --GERD (zantac or protonix prn), IBS, chronic diarrhea --GI records:  [place], 2004, 2009 8 and 07-2013. Negative for polyps or IBD. + Diverticuli. Next 2020 due to [place] OSA--- CPAP, Dr. Maxwell Caul Osteopenia:  Multiple  Dexas, last two:  T score 2009  -1.1 T score 2015  -0.7 DJD --Chronic back pain-- tylenol #3 prn --- RF per PCP --history of epidurals  in the past, MRI 2014: Scoliosis,DJD --CIPRO pre dental d/t knee replacement (rx by ortho) H/o endometrial cancer, found during a hysterectomy, no chemotherapy or XRT. Pap smear 04-2014 within normal per patient   PLAN:  Hyperglycemia: CBGs in the 80s - 90s with cinnamon, patient wonder if this is safe to take large amount of supplements. I'm not sure. I still recommend a healthy diet, exercise as the primary way to manage her CBGs. LLQ pain, LUTS: CT scan was negative, saw urology, no pathology found, felt to be irritable bladder. Was recommended dietary changes and Azo-Standard OTC, feeling better  Hypercalcemia: Chronic for more than 10 years per patient. Will check ionized calcium and PTH. She is currently on calcium 1000 mg daily and vitamin D approximately 1300 units. RTC 6 months.

## 2016-05-30 NOTE — Progress Notes (Signed)
Pre visit review using our clinic review tool, if applicable. No additional management support is needed unless otherwise documented below in the visit note. 

## 2016-05-31 LAB — PARATHYROID HORMONE, INTACT (NO CA): PTH: 77 pg/mL — ABNORMAL HIGH (ref 14–64)

## 2016-05-31 LAB — CALCIUM, IONIZED: Calcium, Ion: 5.8 mg/dL — ABNORMAL HIGH (ref 4.8–5.6)

## 2016-06-01 DIAGNOSIS — G4733 Obstructive sleep apnea (adult) (pediatric): Secondary | ICD-10-CM | POA: Diagnosis not present

## 2016-06-05 ENCOUNTER — Encounter: Payer: Self-pay | Admitting: Internal Medicine

## 2016-06-05 NOTE — Addendum Note (Signed)
Addended byDamita Dunnings D on: 06/05/2016 01:59 PM   Modules accepted: Orders

## 2016-06-06 ENCOUNTER — Other Ambulatory Visit (INDEPENDENT_AMBULATORY_CARE_PROVIDER_SITE_OTHER): Payer: Medicare Other

## 2016-06-07 ENCOUNTER — Telehealth: Payer: Self-pay | Admitting: Internal Medicine

## 2016-06-07 DIAGNOSIS — E213 Hyperparathyroidism, unspecified: Secondary | ICD-10-CM

## 2016-06-07 NOTE — Telephone Encounter (Signed)
Per Dr. [name], okay to send general surgery referral to Dr. Harlow Asa for q/o hyperparathyroidism.

## 2016-06-07 NOTE — Telephone Encounter (Signed)
Caller name: Relationship to patient: Self Can be reached: [phone]  Pharmacy:  Reason for call: Request referral to see Dr. Armandina Gemma. (Please communicate via My Chart)

## 2016-06-09 DIAGNOSIS — R140 Abdominal distension (gaseous): Secondary | ICD-10-CM | POA: Diagnosis not present

## 2016-06-09 DIAGNOSIS — K580 Irritable bowel syndrome with diarrhea: Secondary | ICD-10-CM | POA: Diagnosis not present

## 2016-06-09 LAB — VITAMIN D 1,25 DIHYDROXY
Vitamin D 1, 25 (OH)2 Total: 60 pg/mL (ref 18–72)
Vitamin D3 1, 25 (OH)2: 60 pg/mL

## 2016-06-12 ENCOUNTER — Other Ambulatory Visit: Payer: Self-pay | Admitting: Internal Medicine

## 2016-06-13 DIAGNOSIS — H35373 Puckering of macula, bilateral: Secondary | ICD-10-CM | POA: Diagnosis not present

## 2016-06-13 DIAGNOSIS — H43813 Vitreous degeneration, bilateral: Secondary | ICD-10-CM | POA: Diagnosis not present

## 2016-06-13 DIAGNOSIS — H43392 Other vitreous opacities, left eye: Secondary | ICD-10-CM | POA: Diagnosis not present

## 2016-06-13 DIAGNOSIS — H2513 Age-related nuclear cataract, bilateral: Secondary | ICD-10-CM | POA: Diagnosis not present

## 2016-06-13 MED ORDER — ACETAMINOPHEN-CODEINE #3 300-30 MG PO TABS: 1.0000 | ORAL_TABLET | Freq: Every day | ORAL | 0 refills | Status: DC | PRN

## 2016-06-13 NOTE — Telephone Encounter (Signed)
Pt informed via MyChart that Rx has been placed at front desk for pick up at her convenience.  

## 2016-06-13 NOTE — Telephone Encounter (Signed)
Ok 30 tablets, no refill

## 2016-06-13 NOTE — Telephone Encounter (Signed)
Pt is requesting refill on Tylenol # 3.   Last OV: 05/30/2016 Last Fill: 05/02/2016 #30 and 0RF UDS: None  Will need UDS and contract at time of pick up.  Please advise.

## 2016-06-13 NOTE — Telephone Encounter (Signed)
Rx printed, awaiting MD signature.  

## 2016-06-15 NOTE — Telephone Encounter (Signed)
No, is ok to wait

## 2016-06-15 NOTE — Telephone Encounter (Signed)
Please advise 

## 2016-06-15 NOTE — Telephone Encounter (Signed)
Can be reached: [phone] or [phone] Pt would prefer msg in [place]   Reason for call: Pt is scheduled for appt 08/07/16 with Dr. Harlow Asa at [facility]. Pt has declined the ENT appt as she previously saw Dr. Lucia Gaskins ENT but he does not do parathyroid surgery. Pt is asking if there is concern for her waiting until 08/07/16 due to calcium levels. Pt would like msg in MyChart with feedback.

## 2016-06-15 NOTE — Telephone Encounter (Signed)
MyChart message sent to Pt as requested.

## 2016-06-19 ENCOUNTER — Other Ambulatory Visit: Payer: Self-pay | Admitting: Internal Medicine

## 2016-06-21 MED FILL — ACETAMINOPHEN/COD #3 TABLET: 300-30 | 30 days supply | Qty: 30 | Fill #0

## 2016-06-29 ENCOUNTER — Encounter: Payer: Self-pay | Admitting: Internal Medicine

## 2016-07-22 DIAGNOSIS — N390 Urinary tract infection, site not specified: Secondary | ICD-10-CM | POA: Diagnosis not present

## 2016-07-22 DIAGNOSIS — R350 Frequency of micturition: Secondary | ICD-10-CM | POA: Diagnosis not present

## 2016-07-22 DIAGNOSIS — R300 Dysuria: Secondary | ICD-10-CM | POA: Diagnosis not present

## 2016-08-15 DIAGNOSIS — E210 Primary hyperparathyroidism: Secondary | ICD-10-CM | POA: Diagnosis not present

## 2016-08-15 DIAGNOSIS — L720 Epidermal cyst: Secondary | ICD-10-CM | POA: Diagnosis not present

## 2016-08-16 ENCOUNTER — Other Ambulatory Visit (HOSPITAL_COMMUNITY): Payer: Self-pay | Admitting: Surgery

## 2016-08-16 DIAGNOSIS — E210 Primary hyperparathyroidism: Secondary | ICD-10-CM

## 2016-08-18 DIAGNOSIS — E210 Primary hyperparathyroidism: Secondary | ICD-10-CM | POA: Diagnosis not present

## 2016-08-21 ENCOUNTER — Telehealth: Payer: Self-pay | Admitting: Internal Medicine

## 2016-08-21 ENCOUNTER — Encounter (HOSPITAL_COMMUNITY): Payer: Self-pay | Admitting: Nurse Practitioner

## 2016-08-21 ENCOUNTER — Ambulatory Visit (HOSPITAL_COMMUNITY)
Admission: RE | Admit: 2016-08-21 | Discharge: 2016-08-21 | Disposition: A | Discharge status: Home / Self Care | Payer: Medicare Other | Source: Ambulatory Visit | Attending: Nurse Practitioner | Admitting: Nurse Practitioner

## 2016-08-21 VITALS — BP 148/84 | HR 106 | Ht 68.0 in | Wt 257.6 lb

## 2016-08-21 DIAGNOSIS — I10 Essential (primary) hypertension: Secondary | ICD-10-CM | POA: Diagnosis not present

## 2016-08-21 DIAGNOSIS — Z79899 Other long term (current) drug therapy: Secondary | ICD-10-CM | POA: Insufficient documentation

## 2016-08-21 DIAGNOSIS — Z833 Family history of diabetes mellitus: Secondary | ICD-10-CM | POA: Insufficient documentation

## 2016-08-21 DIAGNOSIS — Z7901 Long term (current) use of anticoagulants: Secondary | ICD-10-CM | POA: Insufficient documentation

## 2016-08-21 DIAGNOSIS — Z888 Allergy status to other drugs, medicaments and biological substances status: Secondary | ICD-10-CM | POA: Diagnosis not present

## 2016-08-21 DIAGNOSIS — F329 Major depressive disorder, single episode, unspecified: Secondary | ICD-10-CM | POA: Insufficient documentation

## 2016-08-21 DIAGNOSIS — Z8041 Family history of malignant neoplasm of ovary: Secondary | ICD-10-CM | POA: Insufficient documentation

## 2016-08-21 DIAGNOSIS — I480 Paroxysmal atrial fibrillation: Secondary | ICD-10-CM | POA: Diagnosis not present

## 2016-08-21 DIAGNOSIS — Z803 Family history of malignant neoplasm of breast: Secondary | ICD-10-CM | POA: Insufficient documentation

## 2016-08-21 DIAGNOSIS — K580 Irritable bowel syndrome with diarrhea: Secondary | ICD-10-CM | POA: Diagnosis not present

## 2016-08-21 DIAGNOSIS — Z880 Allergy status to penicillin: Secondary | ICD-10-CM | POA: Insufficient documentation

## 2016-08-21 DIAGNOSIS — R7303 Prediabetes: Secondary | ICD-10-CM | POA: Diagnosis not present

## 2016-08-21 DIAGNOSIS — G4733 Obstructive sleep apnea (adult) (pediatric): Secondary | ICD-10-CM | POA: Diagnosis not present

## 2016-08-21 DIAGNOSIS — M5416 Radiculopathy, lumbar region: Secondary | ICD-10-CM | POA: Diagnosis not present

## 2016-08-21 DIAGNOSIS — E785 Hyperlipidemia, unspecified: Secondary | ICD-10-CM | POA: Diagnosis not present

## 2016-08-21 DIAGNOSIS — Z87891 Personal history of nicotine dependence: Secondary | ICD-10-CM | POA: Insufficient documentation

## 2016-08-21 DIAGNOSIS — K219 Gastro-esophageal reflux disease without esophagitis: Secondary | ICD-10-CM | POA: Diagnosis not present

## 2016-08-21 DIAGNOSIS — Z800 Family history of malignant neoplasm of digestive organs: Secondary | ICD-10-CM | POA: Diagnosis not present

## 2016-08-21 DIAGNOSIS — M8580 Other specified disorders of bone density and structure, unspecified site: Secondary | ICD-10-CM | POA: Insufficient documentation

## 2016-08-21 DIAGNOSIS — Z8542 Personal history of malignant neoplasm of other parts of uterus: Secondary | ICD-10-CM | POA: Insufficient documentation

## 2016-08-21 DIAGNOSIS — Z96651 Presence of right artificial knee joint: Secondary | ICD-10-CM | POA: Diagnosis not present

## 2016-08-21 DIAGNOSIS — Z9049 Acquired absence of other specified parts of digestive tract: Secondary | ICD-10-CM | POA: Insufficient documentation

## 2016-08-21 DIAGNOSIS — Z8249 Family history of ischemic heart disease and other diseases of the circulatory system: Secondary | ICD-10-CM | POA: Insufficient documentation

## 2016-08-21 DIAGNOSIS — Z9889 Other specified postprocedural states: Secondary | ICD-10-CM | POA: Diagnosis not present

## 2016-08-21 LAB — CBC
HCT: 46 % (ref 36.0–46.0)
Hemoglobin: 15.1 g/dL — ABNORMAL HIGH (ref 12.0–15.0)
MCH: 28.8 pg (ref 26.0–34.0)
MCHC: 32.8 g/dL (ref 30.0–36.0)
MCV: 87.6 fL (ref 78.0–100.0)
PLATELETS: 259 10*3/uL (ref 150–400)
RBC: 5.25 MIL/uL — AB (ref 3.87–5.11)
RDW: 14.2 % (ref 11.5–15.5)
WBC: 7.5 10*3/uL (ref 4.0–10.5)

## 2016-08-21 LAB — BASIC METABOLIC PANEL
ANION GAP: 9 (ref 5–15)
BUN: 12 mg/dL (ref 6–20)
CALCIUM: 11 mg/dL — AB (ref 8.9–10.3)
CO2: 27 mmol/L (ref 22–32)
CREATININE: 0.73 mg/dL (ref 0.44–1.00)
Chloride: 106 mmol/L (ref 101–111)
Glucose, Bld: 104 mg/dL — ABNORMAL HIGH (ref 65–99)
Potassium: 4.4 mmol/L (ref 3.5–5.1)
SODIUM: 142 mmol/L (ref 135–145)

## 2016-08-21 LAB — MAGNESIUM: MAGNESIUM: 2.3 mg/dL (ref 1.7–2.4)

## 2016-08-21 MED ORDER — DILTIAZEM HCL 30 MG PO TABS: ORAL_TABLET | ORAL | 0 refills | Status: DC

## 2016-08-21 NOTE — Telephone Encounter (Signed)
New message  Pt call requesting to speak with RN.  Pt states she went into Afib early this morning and took her medication the patient states she came out of afib around 4:30am. Pt states she has went back into afib. Please call back to discuss

## 2016-08-21 NOTE — Telephone Encounter (Signed)
Pt states she woke up about 12:30 AM in A Fib, took a total of flecainide 300 mg, was in SR about 4:30 AM. Pt states a short time ago she went back in A Fib, HR 92-100, BP 141-151-90-101, pt denies any symptoms except feeling that her heart rate is fast and irregular, does not feel she could walk long distance. Pt states she has not missed any doses of Xarelto.   I reviewed with Dr Nahser--Dr Acie Fredrickson recommended appt for pt in [facility] this afternoon. Pt aware she has been scheduled to see Roderic Palau, NP today at 2:30 PM in [facility].

## 2016-08-21 NOTE — Addendum Note (Signed)
Encounter addended by: Iona Hansen, CMA on: 08/21/2016  3:43 PM<BR>    Actions taken: Order list changed

## 2016-08-21 NOTE — Progress Notes (Signed)
Primary Care Physician: Colon Branch, MD Referring Physician: [facility] triage EP: Dr. Ludger Nutting is a 71 y.o. female with a h/o paroxsymal afib  followed by Dr. Caryl Comes. She usually will take EITHER flecainide or propafenone to help restore SR. She noted around 4:30 am today that she was in afib and took her usual 300 mg flecainide. Around 11 am, she noted return to SR, however early afternoon, she went back into afib. She also noted that she is being worked up for abnormal parathyroid levels and her IBS is bothering her with more diarrhea over the last couple of days. She has been treating with imodium and two doses peptobismol yesterday. She is not on daily rate control because she normally has a heart rate in the 50's. She is on xarelto 20 mg daily, states no missed doses.  Today, she denies symptoms of   chest pain, shortness of breath, orthopnea, PND, lower extremity edema, dizziness, presyncope, syncope, or neurologic sequela. Positive for palpitations/ fatigue.The patient is tolerating medications without difficulties and is otherwise without complaint today.   Past Medical History:  Diagnosis Date  . Back pain   . Chronic sinusitis    s/p 2 surgeries remotely, [name]  . Closed fracture of metatarsal of left foot    L foot, fifth metatarsal  . Ectopic pregnancy   . Endometrial cancer ([place]) 2004   Early diagnosis  . GERD (gastroesophageal reflux disease)   . Hyperlipemia   . Hypertension   . IBS (irritable bowel syndrome) 08/13/2014   Dx 2015, diarrhea on-off, Dr [name]   . Lumbar radiculopathy 08/2012   MRI in 08/2012  . Mild depression ([place])    Pt's son has Down's Syndrome  . Mild hyperparathyroidism ([place])   . Osteopenia   . PAF (paroxysmal atrial fibrillation) ([place])   . Prediabetes   . Sciatica of right side 08/2013   Received Depomedrol 80 mg injection  . Sleep apnea 09/09/2013   Dx with OSA in 2015, by Dr. Caryl Comes, APAP   Past Surgical History:  Procedure  Laterality Date  . ABDOMINAL HYSTERECTOMY    . CHOLECYSTECTOMY  ~2006  . DILATION AND CURETTAGE OF UTERUS     after SAB  . ESOPHAGOGASTRODUODENOSCOPY  02/09/2014   Dr. [name]  . FINGER SURGERY Left    index  . NASAL SINUS SURGERY     x 2 remotely, [name]  . TONSILLECTOMY    . TOTAL ABDOMINAL HYSTERECTOMY W/ BILATERAL SALPINGOOPHORECTOMY    . TOTAL KNEE ARTHROPLASTY Right ~2008    Current Outpatient Prescriptions  Medication Sig Dispense Refill  . acetaminophen-codeine (TYLENOL #3) 300-30 MG tablet Take 1 tablet by mouth daily as needed for moderate pain. 30 tablet 0  . Alum Hydroxide-Mag Carbonate (GAVISCON PO) Take 1-2 tablets by mouth as needed (indigestion).    . CRANBERRY PO Take 1 capsule by mouth daily.    . flecainide (TAMBOCOR) 150 MG tablet Take 150 mg by mouth as needed. Pill in a pocket    . flecainide (TAMBOCOR) 50 MG tablet Take 100 mg by mouth 2 (two) times daily as needed.     . fluticasone (FLONASE) 50 MCG/ACT nasal spray Place into both nostrils as needed for allergies or rhinitis.    Marland Kitchen loperamide (IMODIUM A-D) 2 MG tablet Take 2 mg by mouth. 0.5 tablet bid, increase when has diarrhea    . MAGNESIUM-OXIDE PO Take 500 mg by mouth 2 (two) times daily.    Marland Kitchen  Multiple Vitamins-Minerals (MULTIVITAMIN PO) Take 1 tablet by mouth daily.    . Omega-3 Fatty Acids (FISH OIL) 1200 MG CAPS Take 1,200 mg by mouth daily.    . propafenone (RYTHMOL) 225 MG tablet Take 2 tablets by mouth daily as needed for afib 90 tablet 3  . ranitidine (ZANTAC) 150 MG capsule Take 150 mg by mouth daily as needed for heartburn.    . rivaroxaban (XARELTO) 20 MG TABS tablet Take 1 tablet (20 mg total) by mouth daily with supper. 90 tablet 3  . simvastatin (ZOCOR) 40 MG tablet Take 1 tablet (40 mg total) by mouth at bedtime. 90 tablet 2  . valsartan-hydrochlorothiazide (DIOVAN-HCT) 320-25 MG tablet Take 1 tablet by mouth daily. 90 tablet 2  . Calcium Citrate-Vitamin D (CALCIUM CITRATE + PO) Take by  mouth.    . cetirizine (ZYRTEC) 10 MG tablet Take 10 mg by mouth as needed for allergies.    . cholecalciferol (VITAMIN D) 1000 UNITS tablet Take 2,000 Units by mouth every other day.    Marland Kitchen CINNAMON PO Take 0.5 tsp by mouth twice daily    . ciprofloxacin (CIPRO) 500 MG tablet Take 500 mg by mouth as needed (prior to dental work).    . Coenzyme Q10 (CO Q 10) 10 MG CAPS Take 100 mg by mouth daily.     Marland Kitchen diltiazem (CARDIZEM) 30 MG tablet Take one tablet by mouth every 4 hours AS NEEDED for A-fib HR > 100 as long as BP > 100. 30 tablet 0  . furosemide (LASIX) 20 MG tablet Take 20 mg by mouth daily as needed for fluid.     No current facility-administered medications for this encounter.     Allergies  Allergen Reactions  . Nsaids Other (See Comments)    Pt on Xarelto  . Tolmetin Other (See Comments)    Pt on Xarelto  . Zoster Vaccine Live Other (See Comments)    Sickness per Pt.   Marland Kitchen Amoxicillin Rash    Social History   Social History  . Marital status: Married    Spouse name: N/A  . Number of children: 1  . Years of education: N/A   Occupational History  . 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 Smoking status: Former Smoker    Quit date: 01/06/1980  . Smokeless tobacco: Never Used  . Alcohol use Yes     Comment: rare  . Drug use: No  . Sexual activity: No   Other Topics Concern  . Not on file   Social History Narrative   Lives w/ husband, and son [name]    Family History  Problem Relation Age of Onset  . Hypertension Mother   . Colon cancer Mother   . Breast cancer Mother   . Ovarian cancer Mother   . Hypertension Father   . Diabetes Father   . Head & neck cancer Sister   . CAD Neg Hx     ROS- All systems are reviewed and negative except as per the HPI above  Physical Exam: Vitals:   08/21/16 1434  BP: (!) 148/84  Pulse: (!) 106  Weight: 257 lb 9.6 oz (116.8 kg)  Height: 5\' 8"  (1.727 m)   Wt Readings from Last 3 Encounters:  08/21/16 257  lb 9.6 oz (116.8 kg)  05/30/16 261 lb 6 oz (118.6 kg)  05/17/16 263 lb (119.3 kg)    Labs: Lab Results  Component Value Date   NA 140 05/02/2016  K 3.7 05/02/2016   CL 105 05/02/2016   CO2 29 05/02/2016   GLUCOSE 102 (H) 05/02/2016   BUN 14 05/02/2016   CREATININE 0.66 05/02/2016   CALCIUM 10.6 (H) 05/02/2016   MG 2.1 05/02/2016   Lab Results  Component Value Date   INR 2.5 (H) 11/08/2006   Lab Results  Component Value Date   CHOL 165 05/02/2016   HDL 55.70 05/02/2016   LDLCALC 79 05/02/2016   TRIG 153.0 (H) 05/02/2016     GEN- The patient is well appearing, alert and oriented x 3 today.   Head- normocephalic, atraumatic Eyes-  Sclera clear, conjunctiva pink Ears- hearing intact Oropharynx- clear Neck- supple, no JVP Lymph- no cervical lymphadenopathy Lungs- Clear to ausculation bilaterally, normal work of breathing Heart- irregular rate and rhythm, no murmurs, rubs or gallops, PMI not laterally displaced GI- soft, NT, ND, + BS Extremities- no clubbing, cyanosis, or edema MS- no significant deformity or atrophy Skin- no rash or lesion Psych- euthymic mood, full affect Neuro- strength and sensation are intact  EKG-afib at 106 bpm, RBBB, LAFB, qrs int 166 ms, qtc 448 ms Epic records reviewed    Assessment and Plan: 1. Paroxysmal afib  First occurred 4:30 this am, responded well to 300 mg flecainide but has now reoccurred Do not take any further flecainide or Rythmol since she took the big conversion dose this am Will give 30 mg Cardizem to use every 4 hours if sys BP is over 100 and pulse stays around 100 If afib persists she will need cardioversion She will call the office in the am and let us know if afib persisits Otherwise, to the ER if condition worsens She plans to let her GI doctor know that she is having a flare of diarrhea with IBS Bmet/cbc today  Butch Penny C. Tyffany Waldrop, [facility] [address] [place], [place]  [address] [phone]

## 2016-08-21 NOTE — Patient Instructions (Signed)
Start Cardizem 30 mg.   Take one tablet by mouth every 4 hours AS NEEDED for A-fib HR > 100 as long as BP > 100.  Please call us tomorrow for an update

## 2016-08-23 ENCOUNTER — Encounter (HOSPITAL_COMMUNITY): Payer: Medicare Other

## 2016-08-23 ENCOUNTER — Encounter (HOSPITAL_COMMUNITY)
Admission: RE | Admit: 2016-08-23 | Discharge: 2016-08-23 | Disposition: A | Discharge status: Home / Self Care | Payer: Medicare Other | Source: Ambulatory Visit | Attending: Surgery | Admitting: Surgery

## 2016-08-23 DIAGNOSIS — E210 Primary hyperparathyroidism: Secondary | ICD-10-CM | POA: Insufficient documentation

## 2016-08-23 MED ORDER — TECHNETIUM TC 99M SESTAMIBI - CARDIOLITE
25.0000 | Freq: Once | INTRAVENOUS | Status: AC | PRN
  Administered 2016-08-23: 25 via INTRAVENOUS

## 2016-08-30 ENCOUNTER — Other Ambulatory Visit: Payer: Self-pay | Admitting: Surgery

## 2016-08-30 DIAGNOSIS — E210 Primary hyperparathyroidism: Secondary | ICD-10-CM

## 2016-09-06 ENCOUNTER — Ambulatory Visit
Admission: RE | Admit: 2016-09-06 | Discharge: 2016-09-06 | Disposition: A | Discharge status: Home / Self Care | Payer: Medicare Other | Source: Ambulatory Visit | Attending: Surgery | Admitting: Surgery

## 2016-09-06 ENCOUNTER — Other Ambulatory Visit: Payer: Self-pay | Admitting: Surgery

## 2016-09-06 DIAGNOSIS — E041 Nontoxic single thyroid nodule: Secondary | ICD-10-CM | POA: Diagnosis not present

## 2016-09-06 DIAGNOSIS — E210 Primary hyperparathyroidism: Secondary | ICD-10-CM

## 2016-09-11 ENCOUNTER — Telehealth: Payer: Self-pay

## 2016-09-11 NOTE — Telephone Encounter (Signed)
LMOM informing Pt that Handicap Placard form has been completed and placed at front desk for pick up at her convenience. Copy of form sent for scanning.

## 2016-09-15 ENCOUNTER — Other Ambulatory Visit: Payer: Self-pay | Admitting: Surgery

## 2016-09-15 DIAGNOSIS — E210 Primary hyperparathyroidism: Secondary | ICD-10-CM

## 2016-09-22 ENCOUNTER — Telehealth: Payer: Self-pay | Admitting: Internal Medicine

## 2016-09-22 MED ORDER — ACETAMINOPHEN-CODEINE #3 300-30 MG PO TABS: 1.0000 | ORAL_TABLET | Freq: Every day | ORAL | 0 refills | Status: DC | PRN

## 2016-09-22 NOTE — Telephone Encounter (Signed)
Okay Tylenol No. 3 , # 30 no refills. Switch from Diovan with me in next refill

## 2016-09-22 NOTE — Telephone Encounter (Signed)
LMOM informing Pt that Rx has been placed at front desk for pick up at her convenience.  

## 2016-09-22 NOTE — Telephone Encounter (Addendum)
Pt is requesting refill on Tylenol #3.   Last OV: 05/30/2016 Last Fill: 06/13/2016 #30 and 0RF UDS: None  NCCR printed; no issues noted.    Please advise.    Also, Pt still on Diovan-HCT 320-25mg . Please advise.

## 2016-09-22 NOTE — Telephone Encounter (Signed)
Rx printed, awaiting MD signature.  

## 2016-09-22 NOTE — Telephone Encounter (Signed)
°  Relation to pt : self  Call back [phone]   Reason for call:  Patient requesting a refill acetaminophen-codeine (TYLENOL #3) 300-30 MG tablet, please advise

## 2016-09-28 ENCOUNTER — Ambulatory Visit
Admission: RE | Admit: 2016-09-28 | Discharge: 2016-09-28 | Disposition: A | Discharge status: Home / Self Care | Payer: Medicare Other | Source: Ambulatory Visit | Attending: Surgery | Admitting: Surgery

## 2016-09-28 DIAGNOSIS — E210 Primary hyperparathyroidism: Secondary | ICD-10-CM

## 2016-09-28 DIAGNOSIS — I6523 Occlusion and stenosis of bilateral carotid arteries: Secondary | ICD-10-CM | POA: Diagnosis not present

## 2016-09-28 MED ORDER — IOPAMIDOL (ISOVUE-300) INJECTION 61%
75.0000 mL | Freq: Once | INTRAVENOUS | Status: AC | PRN
Start: 2016-09-28 — End: 2016-09-28
  Administered 2016-09-28: 75 mL via INTRAVENOUS

## 2016-10-16 DIAGNOSIS — E210 Primary hyperparathyroidism: Secondary | ICD-10-CM | POA: Diagnosis not present

## 2016-10-18 ENCOUNTER — Telehealth: Payer: Self-pay | Admitting: Internal Medicine

## 2016-10-18 ENCOUNTER — Encounter: Payer: Self-pay | Admitting: Internal Medicine

## 2016-10-18 ENCOUNTER — Ambulatory Visit (INDEPENDENT_AMBULATORY_CARE_PROVIDER_SITE_OTHER): Payer: Medicare Other | Admitting: Internal Medicine

## 2016-10-18 ENCOUNTER — Telehealth (HOSPITAL_COMMUNITY): Payer: Self-pay | Admitting: *Deleted

## 2016-10-18 VITALS — BP 116/78 | HR 65 | Temp 97.7°F | Resp 14 | Ht 68.0 in | Wt 251.2 lb

## 2016-10-18 DIAGNOSIS — R197 Diarrhea, unspecified: Secondary | ICD-10-CM

## 2016-10-18 DIAGNOSIS — K529 Noninfective gastroenteritis and colitis, unspecified: Secondary | ICD-10-CM | POA: Diagnosis not present

## 2016-10-18 DIAGNOSIS — I480 Paroxysmal atrial fibrillation: Secondary | ICD-10-CM | POA: Diagnosis not present

## 2016-10-18 MED FILL — ACETAMINOPHEN/COD #3 TABLET: 300-30 | 30 days supply | Qty: 30 | Fill #0

## 2016-10-18 NOTE — Progress Notes (Signed)
Subjective:    Patient ID: Veronica Bradford, female    DOB: [birth year], 71 y.o.   MRN: [id number]  DOS:  10/18/2016 Type of visit - description : acute Interval history: She has IBS-D with on and off diarrhea however 8 days ago started with severe nausea, vomiting, diarrhea and abdominal pain.  Diarrhea was watery, several episodes a day, no blood or mucus in the stools. No hematemesis. She was able to drink plenty of fluids and continue taking her meds and Mg supplements. After 36 hours she felt better, did notice weight loss. Symptoms resurface 3 days ago on 10/15/2016: Nausea, vomiting, diarrhea and abdominal pain again. Abdominal pain was diffuse, crampy, not localizing to the left of the right. She attempted to eat a more normal diet yesterday and had more diarrhea. Today is on a clear diet, still having watery clear stool diarrhea.  At the same time, had 2 episodes of atrial fibrillation characterized by tachycardia/palpitations. First episode was 10/16/2016, she took propafenone x 2, symptoms stopped after 4 hours. Had another episode today at 5 AM , took propafenone again x 1 and Cardizem as recommended by cardiology. Symptoms stopped at noon.    Wt Readings from Last 3 Encounters:  10/18/16 251 lb 4 oz (114 kg)  08/21/16 257 lb 9.6 oz (116.8 kg)  05/30/16 261 lb 6 oz (118.6 kg)    Review of Systems Denies chest pain, shortness of breath or near syncope with episodes of A. fib. No fever chills No dysuria or gross hematuria No recent antibiotics, last one was June  Past Medical History:  Diagnosis Date  . Back pain   . Chronic sinusitis    s/p 2 surgeries remotely, [name]  . Closed fracture of metatarsal of left foot    L foot, fifth metatarsal  . Ectopic pregnancy   . Endometrial cancer ([place]) 2004   Early diagnosis  . GERD (gastroesophageal reflux disease)   . Hyperlipemia   . Hypertension   . IBS (irritable bowel syndrome) 08/13/2014   Dx 2015, diarrhea  on-off, Dr [name]   . Lumbar radiculopathy 08/2012   MRI in 08/2012  . Mild depression ([place])    Pt's son has Down's Syndrome  . Mild hyperparathyroidism ([place])   . Osteopenia   . PAF (paroxysmal atrial fibrillation) ([place])   . Prediabetes   . Sciatica of right side 08/2013   Received Depomedrol 80 mg injection  . Sleep apnea 09/09/2013   Dx with OSA in 2015, by Dr. Caryl Comes, APAP    Past Surgical History:  Procedure Laterality Date  . ABDOMINAL HYSTERECTOMY    . CHOLECYSTECTOMY  ~2006  . DILATION AND CURETTAGE OF UTERUS     after SAB  . ESOPHAGOGASTRODUODENOSCOPY  02/09/2014   Dr. [name]  . FINGER SURGERY Left    index  . NASAL SINUS SURGERY     x 2 remotely, [name]  . TONSILLECTOMY    . TOTAL ABDOMINAL HYSTERECTOMY W/ BILATERAL SALPINGOOPHORECTOMY    . TOTAL KNEE ARTHROPLASTY Right ~2008    Social History   Social History  . Marital status: Married    Spouse name: N/A  . Number of children: 1  . Years of education: N/A   Occupational History  . 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 Smoking status: Former Smoker    Quit date: 01/06/1980  . Smokeless tobacco: Never Used  . Alcohol use Yes     Comment: rare  .  Drug use: No  . Sexual activity: No   Other Topics Concern  . Not on file   Social History Narrative   Lives w/ husband, and son [name]      Allergies as of 10/18/2016      Reactions   Nsaids Other (See Comments)   Pt on Xarelto   Tolmetin Other (See Comments)   Pt on Xarelto   Zoster Vaccine Live Other (See Comments)   Sickness per Pt.    Amoxicillin Rash      Medication List       Accurate as of 10/18/16 11:59 PM. Always use your most recent med list.          acetaminophen-codeine 300-30 MG tablet Commonly known as:  TYLENOL #3 Take 1 tablet by mouth daily as needed for moderate pain.   CALCIUM CITRATE + PO Take by mouth.   cetirizine 10 MG tablet Commonly known as:  ZYRTEC Take 10 mg by mouth as needed for  allergies.   cholecalciferol 1000 units tablet Commonly known as:  VITAMIN D Take 2,000 Units by mouth every other day.   CINNAMON PO Take 0.5 tsp by mouth twice daily   ciprofloxacin 500 MG tablet Commonly known as:  CIPRO Take 500 mg by mouth as needed (prior to dental work).   Co Q 10 10 MG Caps Take 100 mg by mouth daily.   CRANBERRY PO Take 1 capsule by mouth daily.   diltiazem 30 MG tablet Commonly known as:  CARDIZEM Take one tablet by mouth every 4 hours AS NEEDED for A-fib HR > 100 as long as BP > 100.   Fish Oil 1200 MG Caps Take 1,200 mg by mouth daily.   flecainide 50 MG tablet Commonly known as:  TAMBOCOR Take 100 mg by mouth 2 (two) times daily as needed.   flecainide 150 MG tablet Commonly known as:  TAMBOCOR Take 150 mg by mouth as needed. Pill in a pocket   fluticasone 50 MCG/ACT nasal spray Commonly known as:  FLONASE Place into both nostrils as needed for allergies or rhinitis.   furosemide 20 MG tablet Commonly known as:  LASIX Take 20 mg by mouth daily as needed for fluid.   GAVISCON PO Take 1-2 tablets by mouth as needed (indigestion).   IMODIUM A-D 2 MG tablet Generic drug:  loperamide Take 2 mg by mouth. 0.5 tablet bid, increase when has diarrhea   MAGNESIUM-OXIDE PO Take 500 mg by mouth 2 (two) times daily.   MULTIVITAMIN PO Take 1 tablet by mouth daily.   propafenone 225 MG tablet Commonly known as:  RYTHMOL Take 2 tablets by mouth daily as needed for afib   ranitidine 150 MG capsule Commonly known as:  ZANTAC Take 150 mg by mouth daily as needed for heartburn.   rivaroxaban 20 MG Tabs tablet Commonly known as:  XARELTO Take 1 tablet (20 mg total) by mouth daily with supper.   simvastatin 40 MG tablet Commonly known as:  ZOCOR Take 1 tablet (40 mg total) by mouth at bedtime.   valsartan-hydrochlorothiazide 320-25 MG tablet Commonly known as:  DIOVAN-HCT Take 1 tablet by mouth daily.          Objective:    Physical Exam BP 116/78 (BP Location: Left Arm, Patient Position: Sitting, Cuff Size: Normal)   Pulse 65   Temp 97.7 F (36.5 C) (Oral)   Resp 14   Ht 5\' 8"  (1.727 m)   Wt 251 lb 4 oz (114  kg)   SpO2 98%   BMI 38.20 kg/m  General:   Well developed, well nourished . NAD.  HEENT:  Normocephalic . Face symmetric, atraumatic Lungs:  CTA B Normal respiratory effort, no intercostal retractions, no accessory muscle use. Heart: RRR,  no murmur.  Trace ankle edema, slightly worse on the left   Abdomen:  Not distended, soft, minimal tenderness at the epigastric area without mass,rebound or rigidity.  Skin: Not pale. Not jaundice Neurologic:  alert & oriented X3.  Speech normal, gait appropriate for age and unassisted Psych--  Cognition and judgment appear intact.  Cooperative with normal attention span and concentration.  Behavior appropriate. No anxious or depressed appearing.    Assessment & Plan:   Assessment: Prediabetes HTN Hyperlipidemia Chronic lower extremity edema L>R (eval by previous pcp, related to varicose veins?) CV:  Paroxysmal atrial fibrillation: on xarelto and Rythmol----> pill-in-pocket PVCs -- flecainide prn GI: Dr [name]  --GERD (zantac or protonix prn), IBS, chronic diarrhea --GI records: [place], 2004, 2009 8 and 07-2013. Negative for polyps or IBD. + Diverticuli. Next 2020 due to [place] -- IBS, Diarrhea OSA--- CPAP, Dr. Maxwell Caul Osteopenia:  Multiple  Dexas, last two:  T score 2009  -1.1 T score 2015  -0.7 DJD --Chronic back pain-- tylenol #3 prn --- RF per PCP --history of epidurals in the past, MRI 2014: Scoliosis,DJD --CIPRO pre dental d/t knee replacement (rx by ortho) H/o endometrial cancer, found during a hysterectomy, no chemotherapy or XRT. Pap smear 04-2014 within normal per patient Primary hyperparathyroidism: Chronic hypercalcemia, increased PTH 05/2016, refered to ENT  PLAN:  Acute gastroenteritis: Episodes of nausea, vomiting,  diarrhea and abdominal pain likely acute gastroenteritis, doubt related to IBS. No other people around her is sick, no recent antibiotics. She takes a very low iimodium dose  chronically for IBS, recommend to minimize his use, risk of QT prolongation per U-T-D. To help here acute diarrhea recommend Metamucil 2 capsules twice a day for few days. Going forward, rec to  discuss with cardiology appropriateness of take a low dose of Imodium daily. Plan: Continue clear diet, CBC, CMP, magnesium levels. Stool cultures, WBCs and C. difficile. Call if no better. A. fib: EKG today sinus rhythm, no change from previous.. EKG discuss with cardiology who agrees . ER if symptoms persistent. Primary hyperparathyroidism?: Chronic hypercalcemia, increased PTH 05/2016, saw general surgery, further eval was done, she was told she does not have primary hyperparathyroidism. Reassess on RTC  Today, I spent more than 45   min with the patient: >50% of the time counseling regards acute gastroenteritis, listening to her concerns and detailed history. Coordinating her care with cardiology, discussing with them her EKG. Reviewing phone calls from cardiology.

## 2016-10-18 NOTE — Telephone Encounter (Signed)
Pt left msg on machine that she went into afib after 2 days of nausea/vomiting and diarrhea.  She took propafenone 225 mg, 2 tablets on Monday.  Wanted to know if she could take full dose again today.  Per Butch Penny due to metabolization of drug she would advise the patient wait until tomorrow to take more propafenone.  She could use her 30 mg cardizem per instructions and see PCP to get potassium and electrolytes checked.  I called pt and the above was discussed.  Per patient, she already took 1 tab of propafenone at 5 a.m. And so she was advised then to wait 12 hours to take the other tablet, but to use the cardizem 30 mg if HR over 496 and her systolic bp over 759.  Pt understood and was going to call PCP to get lab/nurse visit.  Pt was instructed to call if no improvement

## 2016-10-18 NOTE — Telephone Encounter (Signed)
Spoke w/ Pt, she is still having GI symptoms as of about 30-45 minutes ago, Pt has hx of IBS but symptoms typically do not last this long. Recommend Pt to come into office for visit. Appt scheduled with PCP at 2:30.

## 2016-10-18 NOTE — Telephone Encounter (Signed)
Please see other telephone note from today.  Pt also called AFib clinic who addressed this concern.  Closing encounter.

## 2016-10-18 NOTE — Telephone Encounter (Signed)
Please proceed with a BMP and magnesium level today. DX atrial fibrillation If she is still symptomatic with stomach problems: needs to be seen

## 2016-10-18 NOTE — Progress Notes (Signed)
Pre visit review using our clinic review tool, if applicable. No additional management support is needed unless otherwise documented below in the visit note. 

## 2016-10-18 NOTE — Telephone Encounter (Signed)
Veronica Bradford Self [phone]  [name] called to say that she had a round of nausea and diarrhea last week for two days and again this week and she went into AFib last night, she has taken some imodium and drinking plenty of fluids like coconut water. She also called the Afib Clinic and they recommended she call us to get some labs to check her electrolytes. Please advice.

## 2016-10-18 NOTE — Telephone Encounter (Signed)
Mrs.Ruffino is calling because she is in AFIB and is wanting to know if she should take her medication( Propasenone 225 mg ) , because she took two tablets on Monday . Please call

## 2016-10-18 NOTE — Telephone Encounter (Signed)
Please advise 

## 2016-10-18 NOTE — Patient Instructions (Signed)
GO TO THE LAB : Get the blood work     Continue drinking plenty of clear fluids  Imodium as needed  Call cardiology if you continue having atrial fibrillation  ER if symptoms severe, chest pain   Food Choices to Help Relieve Diarrhea, Adult When you have diarrhea, the foods you eat and your eating habits are very important. Choosing the right foods and drinks can help:  Relieve diarrhea.  Replace lost fluids and nutrients.  Prevent dehydration.  What general guidelines should I follow? Relieving diarrhea  Choose foods with less than 2 g or .07 oz. of fiber per serving.  Limit fats to less than 8 tsp (38 g or 1.34 oz.) a day.  Avoid the following: ? Foods and beverages sweetened with high-fructose corn syrup, honey, or sugar alcohols such as xylitol, sorbitol, and mannitol. ? Foods that contain a lot of fat or sugar. ? Fried, greasy, or spicy foods. ? High-fiber grains, breads, and cereals. ? Raw fruits and vegetables.  Eat foods that are rich in probiotics. These foods include dairy products such as yogurt and fermented milk products. They help increase healthy bacteria in the stomach and intestines (gastrointestinal tract, or GI tract).  If you have lactose intolerance, avoid dairy products. These may make your diarrhea worse.  Take medicine to help stop diarrhea (antidiarrheal medicine) only as told by your health care provider. Replacing nutrients  Eat small meals or snacks every 3-4 hours.  Eat bland foods, such as white rice, toast, or baked potato, until your diarrhea starts to get better. Gradually reintroduce nutrient-rich foods as tolerated or as told by your health care provider. This includes: ? Well-cooked protein foods. ? Peeled, seeded, and soft-cooked fruits and vegetables. ? Low-fat dairy products.  Take vitamin and mineral supplements as told by your health care provider. Preventing dehydration   Start by sipping water or a special solution to  prevent dehydration (oral rehydration solution, ORS). Urine that is clear or pale yellow means that you are getting enough fluid.  Try to drink at least 8-10 cups of fluid each day to help replace lost fluids.  You may add other liquids in addition to water, such as clear juice or decaffeinated sports drinks, as tolerated or as told by your health care provider.  Avoid drinks with caffeine, such as coffee, tea, or soft drinks.  Avoid alcohol. What foods are recommended? The items listed may not be a complete list. Talk with your health care provider about what dietary choices are best for you. Grains White rice. White, [place], or pita breads (fresh or toasted), including plain rolls, buns, or bagels. White pasta. Saltine, soda, or graham crackers. Pretzels. Low-fiber cereal. Cooked cereals made with water (such as cornmeal, farina, or cream cereals). Plain muffins. Matzo. Melba toast. Zwieback. Vegetables Potatoes (without the skin). Most well-cooked and canned vegetables without skins or seeds. Tender lettuce. Fruits Apple sauce. Fruits canned in juice. Cooked apricots, cherries, grapefruit, peaches, pears, or plums. Fresh bananas and cantaloupe. Meats and other protein foods Baked or boiled chicken. Eggs. Tofu. Fish. Seafood. Smooth nut butters. Ground or well-cooked tender beef, ham, veal, lamb, pork, or poultry. Dairy Plain yogurt, kefir, and unsweetened liquid yogurt. Lactose-free milk, buttermilk, skim milk, or soy milk. Low-fat or nonfat hard cheese. Beverages Water. Low-calorie sports drinks. Fruit juices without pulp. Strained tomato and vegetable juices. Decaffeinated teas. Sugar-free beverages not sweetened with sugar alcohols. Oral rehydration solutions, if approved by your health care provider. Seasoning and other foods  Bouillon, broth, or soups made from recommended foods. What foods are not recommended? The items listed may not be a complete list. Talk with your health care  provider about what dietary choices are best for you. Grains Whole grain, whole wheat, bran, or rye breads, rolls, pastas, and crackers. Wild or brown rice. Whole grain or bran cereals. Barley. Oats and oatmeal. Corn tortillas or taco shells. Granola. Popcorn. Vegetables Raw vegetables. Fried vegetables. Cabbage, broccoli, Brussels sprouts, artichokes, baked beans, beet greens, corn, kale, legumes, peas, sweet potatoes, and yams. Potato skins. Cooked spinach and cabbage. Fruits Dried fruit, including raisins and dates. Raw fruits. Stewed or dried prunes. Canned fruits with syrup. Meat and other protein foods Fried or fatty meats. Deli meats. Chunky nut butters. Nuts and seeds. Beans and lentils. Berniece Salines. Hot dogs. Sausage. Dairy High-fat cheeses. Whole milk, chocolate milk, and beverages made with milk, such as milk shakes. Half-and-half. Cream. sour cream. Ice cream. Beverages Caffeinated beverages (such as coffee, tea, soda, or energy drinks). Alcoholic beverages. Fruit juices with pulp. Prune juice. Soft drinks sweetened with high-fructose corn syrup or sugar alcohols. High-calorie sports drinks. Fats and oils Butter. Cream sauces. Margarine. Salad oils. Plain salad dressings. Olives. Avocados. Mayonnaise. Sweets and desserts Sweet rolls, doughnuts, and sweet breads. Sugar-free desserts sweetened with sugar alcohols such as xylitol and sorbitol. Seasoning and other foods Honey. Hot sauce. Chili powder. Gravy. Cream-based or milk-based soups. Pancakes and waffles. Summary  When you have diarrhea, the foods you eat and your eating habits are very important.  Make sure you get at least 8-10 cups of fluid each day, or enough to keep your urine clear or pale yellow.  Eat bland foods and gradually reintroduce healthy, nutrient-rich foods as tolerated, or as told by your health care provider.  Avoid high-fiber, fried, greasy, or spicy foods. This information is not intended to replace advice  given to you by your health care provider. Make sure you discuss any questions you have with your health care provider. Document Released: 03/11/2003 Document Revised: 12/17/2015 Document Reviewed: 12/17/2015 Elsevier Interactive Patient Education  2017 [employer].

## 2016-10-19 LAB — COMPREHENSIVE METABOLIC PANEL
ALBUMIN: 3.9 g/dL (ref 3.5–5.2)
ALK PHOS: 62 U/L (ref 39–117)
ALT: 16 U/L (ref 0–35)
AST: 18 U/L (ref 0–37)
BUN: 11 mg/dL (ref 6–23)
CO2: 31 mEq/L (ref 19–32)
Calcium: 10.4 mg/dL (ref 8.4–10.5)
Chloride: 104 mEq/L (ref 96–112)
Creatinine, Ser: 0.64 mg/dL (ref 0.40–1.20)
GFR: 97.84 mL/min (ref >60.00)
Glucose, Bld: 103 mg/dL — ABNORMAL HIGH (ref 70–99)
Potassium: 4.2 mEq/L (ref 3.5–5.1)
SODIUM: 141 meq/L (ref 135–145)
TOTAL PROTEIN: 6.5 g/dL (ref 6.0–8.3)
Total Bilirubin: 0.7 mg/dL (ref 0.2–1.2)

## 2016-10-19 LAB — CBC WITH DIFFERENTIAL/PLATELET
Basophils Absolute: 0 10*3/uL (ref 0.0–0.1)
Basophils Relative: 0.3 % (ref 0.0–3.0)
EOS PCT: 3.8 % (ref 0.0–5.0)
Eosinophils Absolute: 0.3 10*3/uL (ref 0.0–0.7)
HCT: 46.3 % — ABNORMAL HIGH (ref 36.0–46.0)
Hemoglobin: 15.3 g/dL — ABNORMAL HIGH (ref 12.0–15.0)
Lymphocytes Relative: 30 % (ref 12.0–46.0)
Lymphs Abs: 2.1 10*3/uL (ref 0.7–4.0)
MCHC: 32.9 g/dL (ref 30.0–36.0)
MCV: 89.5 fl (ref 78.0–100.0)
MONO ABS: 0.6 10*3/uL (ref 0.1–1.0)
MONOS PCT: 8.9 % (ref 3.0–12.0)
Neutro Abs: 4.1 10*3/uL (ref 1.4–7.7)
Neutrophils Relative %: 57 % (ref 43.0–77.0)
Platelets: 222 10*3/uL (ref 150.0–400.0)
RBC: 5.18 Mil/uL — AB (ref 3.87–5.11)
RDW: 14.1 % (ref 11.5–15.5)
WBC: 7.1 10*3/uL (ref 4.0–10.5)

## 2016-10-19 LAB — MAGNESIUM: MAGNESIUM: 2.1 mg/dL (ref 1.5–2.5)

## 2016-10-19 NOTE — Assessment & Plan Note (Signed)
Acute gastroenteritis: Episodes of nausea, vomiting, diarrhea and abdominal pain likely acute gastroenteritis, doubt related to IBS. No other people around her is sick, no recent antibiotics. She takes a very low iimodium dose  chronically for IBS, recommend to minimize his use, risk of QT prolongation per U-T-D. To help here acute diarrhea recommend Metamucil 2 capsules twice a day for few days. Going forward, rec to  discuss with cardiology appropriateness of take a low dose of Imodium daily. Plan: Continue clear diet, CBC, CMP, magnesium levels. Stool cultures, WBCs and C. difficile. Call if no better. A. fib: EKG today sinus rhythm, no change from previous.. EKG discuss with cardiology who agrees . ER if symptoms persistent. Primary hyperparathyroidism?: Chronic hypercalcemia, increased PTH 05/2016, saw general surgery, further eval was done, she was told she does not have primary hyperparathyroidism. Reassess on RTC

## 2016-10-20 ENCOUNTER — Ambulatory Visit: Payer: Medicare Other

## 2016-10-20 DIAGNOSIS — R197 Diarrhea, unspecified: Secondary | ICD-10-CM | POA: Diagnosis not present

## 2016-10-20 NOTE — Addendum Note (Signed)
Addended by: Harl Bowie on: 10/20/2016 11:15 AM   Modules accepted: Orders

## 2016-10-22 LAB — TIQ-NTM

## 2016-10-22 LAB — CLOSTRIDIUM DIFFICILE BY PCR

## 2016-10-23 LAB — FECAL LACTOFERRIN, QUANT
FECAL LACTOFERRIN: POSITIVE — AB
MICRO NUMBER:: 81173249
SPECIMEN QUALITY: ADEQUATE

## 2016-10-24 LAB — STOOL CULTURE
MICRO NUMBER: 81173244
MICRO NUMBER:: 81173242
MICRO NUMBER:: 81173243
SHIGA RESULT: NOT DETECTED
SPECIMEN QUALITY:: ADEQUATE
SPECIMEN QUALITY:: ADEQUATE
SPECIMEN QUALITY:: ADEQUATE

## 2016-10-26 DIAGNOSIS — K580 Irritable bowel syndrome with diarrhea: Secondary | ICD-10-CM | POA: Diagnosis not present

## 2016-10-26 DIAGNOSIS — G43A0 Cyclical vomiting, not intractable: Secondary | ICD-10-CM | POA: Diagnosis not present

## 2016-10-30 ENCOUNTER — Encounter: Payer: Self-pay | Admitting: Internal Medicine

## 2016-10-30 ENCOUNTER — Telehealth: Payer: Self-pay | Admitting: Internal Medicine

## 2016-10-30 NOTE — Telephone Encounter (Signed)
Received a note from general surgery, the request patient to be off hydrochlorothiazide for 3-6 months due to primary hyperparathyroidism. After 6 months off diuretics, then they plan to check a I- PTH, serum calcium and vitamin D. She was recommended to continue with vitamin D 2000 units daily. Will discuss with the patient when she comes back in few weeks

## 2016-10-31 ENCOUNTER — Other Ambulatory Visit: Payer: Self-pay | Admitting: Internal Medicine

## 2016-10-31 NOTE — Telephone Encounter (Signed)
Pt last saw [name] 05/17/16, last labs 10/18/16 Creat 0.64, weight 114kg, age 71, CrCl 151.41, based on CrCl pt is on appropriate dosage of Xarelto 20mg  QD.  Will refill rx.

## 2016-11-01 ENCOUNTER — Ambulatory Visit: Payer: Medicare Other

## 2016-11-01 MED FILL — PEG-3350 SOLUTION: 420 | 1 days supply | Qty: 4000 | Fill #0

## 2016-11-02 ENCOUNTER — Telehealth: Payer: Self-pay | Admitting: Internal Medicine

## 2016-11-02 NOTE — Telephone Encounter (Signed)
New Message  Pt call requesting to speak with RN about my chart message sent to the nurse. Please call back to discuss

## 2016-11-02 NOTE — Telephone Encounter (Signed)
Discussed with Roderic Palau NP -- recommended with 220PR on last EKG to take only flecainide 50mg  tonight and tomorrow morning to hopefully prevent any breakthrough afib during procedure. Pt will call if further issues.

## 2016-11-02 NOTE — Telephone Encounter (Addendum)
This patient's most recent medication changes have been addressed by Roderic Palau, NP in AFib Clinic and Dr. Caryl Comes is not in the office. I am routing message to AFib clinic for assistance. Please see patient's advice request dated 10/29

## 2016-11-03 DIAGNOSIS — D126 Benign neoplasm of colon, unspecified: Secondary | ICD-10-CM | POA: Diagnosis not present

## 2016-11-03 DIAGNOSIS — K3189 Other diseases of stomach and duodenum: Secondary | ICD-10-CM | POA: Diagnosis not present

## 2016-11-03 DIAGNOSIS — K5730 Diverticulosis of large intestine without perforation or abscess without bleeding: Secondary | ICD-10-CM | POA: Diagnosis not present

## 2016-11-03 DIAGNOSIS — Z800 Family history of malignant neoplasm of digestive organs: Secondary | ICD-10-CM | POA: Diagnosis not present

## 2016-11-03 DIAGNOSIS — R197 Diarrhea, unspecified: Secondary | ICD-10-CM | POA: Diagnosis not present

## 2016-11-03 DIAGNOSIS — R112 Nausea with vomiting, unspecified: Secondary | ICD-10-CM | POA: Diagnosis not present

## 2016-11-03 LAB — HM COLONOSCOPY

## 2016-11-07 DIAGNOSIS — K3189 Other diseases of stomach and duodenum: Secondary | ICD-10-CM | POA: Diagnosis not present

## 2016-11-07 DIAGNOSIS — D126 Benign neoplasm of colon, unspecified: Secondary | ICD-10-CM | POA: Diagnosis not present

## 2016-11-10 ENCOUNTER — Ambulatory Visit (INDEPENDENT_AMBULATORY_CARE_PROVIDER_SITE_OTHER): Payer: Medicare Other

## 2016-11-10 DIAGNOSIS — Z23 Encounter for immunization: Secondary | ICD-10-CM

## 2016-11-13 ENCOUNTER — Encounter: Payer: Self-pay | Admitting: Physician Assistant

## 2016-11-13 ENCOUNTER — Encounter: Payer: Self-pay | Admitting: Internal Medicine

## 2016-11-17 DIAGNOSIS — K580 Irritable bowel syndrome with diarrhea: Secondary | ICD-10-CM | POA: Diagnosis not present

## 2016-11-17 DIAGNOSIS — G43A0 Cyclical vomiting, not intractable: Secondary | ICD-10-CM | POA: Diagnosis not present

## 2016-11-26 NOTE — Progress Notes (Signed)
Cardiology Office Note Date:  11/27/2016  Patient ID:  Veronica Bradford, Veronica Bradford 1945-01-08, MRN [id number] PCP:  Colon Branch, MD  Cardiologist:  Dr. Caryl Comes    Chief Complaint: Planned 6 months follow up  History of Present Illness: Veronica Bradford is a 71 y.o. female with history of HTN, HLD CBP, OSA, hx of endometrial cancer treated surgically, IBS,  and AFib and PVCs., OSA she reports compliant with CPAP  The patient comes today to be seen for Dr. Caryl Comes, last seen by him in May.  Most recently she had follow up with Maximino Greenland, NP in the Afib clinic in August, at that time had an AFib episode that responded to he 300mg  Flecainide though reoccurred shortly afterwards and rx diltiazem 30mg  Q4 prn.  More recently was planned for EGD/colonoscopy late October, she was advised by AFib clinic to take prophylactic flecainide 50mg  the evening prior and AM of her procedure in hopes to prevent AFib that might end up cancelling her procedure.  She had her EGD/colonoscopy done without Afib or complication.  She reports having to use a PRN flecainide dose abut every 4-6 weeks.  She can tell immediately when she is in Afib, HR tends to be quite fast, towards 120's, and makes her feel very tired/weak.  She denies any kind of CP, outside of he AF feels like her exertional capacity is good.  She continues to stuggle quite a bit with her GI issues, episodic vomiting and diarrhea.  She can have a day with 4-6 episodes of vomiting (never blood or coffee ground), and other days watery diarrhea (again, no blood).  She has been found in the last year or s with elevated calcium and was worked up (negative) for hyperparathyroid, she reports discussions with her PMD about stopping her HCTZ and see if this helps.  She has chronic trace edema, waxes and wanes with known varicose veins, inquires about referral/vascular specialist.  She doesn't feel like she is bothered at all with the PVCs, says those palpitations were much different  then her AF and has not felt them since started on magnesium supplement.  She denies any trouble with the Xarelto, no bleeding or signs of bleeding.  AFib hx: Diagnosed 2015 Uses EITHER flecainide OR propafenone PRN only   Past Medical History:  Diagnosis Date  . Back pain   . Chronic sinusitis    s/p 2 surgeries remotely, [name]  . Closed fracture of metatarsal of left foot    L foot, fifth metatarsal  . Ectopic pregnancy   . Endometrial cancer ([place]) 2004   Early diagnosis  . GERD (gastroesophageal reflux disease)   . Hyperlipemia   . Hypertension   . IBS (irritable bowel syndrome) 08/13/2014   Dx 2015, diarrhea on-off, Dr [name]   . Lumbar radiculopathy 08/2012   MRI in 08/2012  . Mild depression ([place])    Pt's son has Down's Syndrome  . 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 Osteopenia   . PAF (paroxysmal atrial fibrillation) ([place])   . Prediabetes   . Sciatica of right side 08/2013   Received Depomedrol 80 mg injection  . Sleep apnea 09/09/2013   Dx with OSA in 2015, by Dr. Caryl Comes, APAP    Past Surgical History:  Procedure Laterality Date  . ABDOMINAL HYSTERECTOMY    . CHOLECYSTECTOMY  ~2006  . DILATION AND CURETTAGE OF UTERUS     after SAB  . ESOPHAGOGASTRODUODENOSCOPY  02/09/2014   Dr. [name]  .  FINGER SURGERY Left    index  . NASAL SINUS SURGERY     x 2 remotely, [name]  . TONSILLECTOMY    . TOTAL ABDOMINAL HYSTERECTOMY W/ BILATERAL SALPINGOOPHORECTOMY    . TOTAL KNEE ARTHROPLASTY Right ~2008    Current Outpatient Medications  Medication Sig Dispense Refill  . acetaminophen-codeine (TYLENOL #3) 300-30 MG tablet Take 1 tablet by mouth daily as needed for moderate pain. 30 tablet 0  . Alum Hydroxide-Mag Carbonate (GAVISCON PO) Take 1-2 tablets by mouth as needed (indigestion).    . Calcium Citrate-Vitamin D (CALCIUM CITRATE + PO) Take by mouth.    . cetirizine (ZYRTEC) 10 MG tablet Take 10 mg by mouth as needed for allergies.    . cholecalciferol (VITAMIN D)  1000 UNITS tablet Take 2,000 Units by mouth every other day.    Marland Kitchen CINNAMON PO Take 0.5 tsp by mouth twice daily    . ciprofloxacin (CIPRO) 500 MG tablet Take 500 mg by mouth as needed (prior to dental work).    . Coenzyme Q10 (CO Q 10) 10 MG CAPS Take 100 mg by mouth daily.     Marland Kitchen CRANBERRY PO Take 1 capsule by mouth as needed.     . diltiazem (CARDIZEM) 30 MG tablet Take one tablet by mouth every 4 hours AS NEEDED for A-fib HR > 100 as long as BP > 100. 30 tablet 0  . flecainide (TAMBOCOR) 150 MG tablet Take 150 mg by mouth 2 (two) times daily. Pill in a pocket     . flecainide (TAMBOCOR) 50 MG tablet Take 100 mg by mouth 2 (two) times daily as needed.     . fluticasone (FLONASE) 50 MCG/ACT nasal spray Place into both nostrils as needed for allergies or rhinitis.    . furosemide (LASIX) 20 MG tablet Take 20 mg by mouth daily as needed for fluid.    Marland Kitchen loperamide (IMODIUM A-D) 2 MG tablet Take 2 mg by mouth. 0.5 tablet bid, increase when has diarrhea    . MAGNESIUM-OXIDE PO Take 500 mg by mouth 2 (two) times daily.    . Multiple Vitamins-Minerals (MULTIVITAMIN PO) Take 1 tablet by mouth daily.    . Omega-3 Fatty Acids (FISH OIL) 1200 MG CAPS Take 1,200 mg by mouth daily.    . pantoprazole (PROTONIX) 40 MG tablet Take 40 mg by mouth daily.    . propafenone (RYTHMOL) 225 MG tablet Take 2 tablets by mouth daily as needed for afib 90 tablet 3  . ranitidine (ZANTAC) 150 MG capsule Take 150 mg by mouth daily as needed for heartburn.    . simvastatin (ZOCOR) 40 MG tablet Take 1 tablet (40 mg total) by mouth at bedtime. 90 tablet 2  . valsartan-hydrochlorothiazide (DIOVAN-HCT) 320-25 MG tablet Take 1 tablet by mouth daily. 90 tablet 2  . XARELTO 20 MG TABS tablet TAKE 1 TABLET BY MOUTH  DAILY WITH SUPPER 90 tablet 1   No current facility-administered medications for this visit.     Allergies:   Nsaids; Tolmetin; Zoster vaccine live; and Amoxicillin   Social History:  The patient  reports that she  quit smoking about 36 years ago. she has never used smokeless tobacco. She reports that she drinks alcohol. She reports that she does not use drugs.   Family History:  The patient's family history includes Breast cancer in her mother; Colon cancer in her mother; Diabetes in her father; Head & neck cancer in her sister; Hypertension in her father and mother;  Ovarian cancer in her mother.  ROS:  Please see the history of present illness.  All other systems are reviewed and otherwise negative.   PHYSICAL EXAM:  VS:  BP 122/66   Pulse 69   Ht 5\' 8"  (1.727 m)   Wt 255 lb (115.7 kg)   SpO2 98%   BMI 38.77 kg/m  BMI: Body mass index is 38.77 kg/m. Well nourished, well developed, in no acute distress  HEENT: normocephalic, atraumatic  Neck: no JVD, carotid bruits or masses Cardiac:  RRR; no significant murmurs, no rubs, or gallops Lungs:  CTA b/l, no wheezing, rhonchi or rales  Abd: soft, nontender, obese MS: no deformity or atrophy Ext: trace ankle edema b/l Skin: warm and dry, no rash Neuro:  No gross deficits appreciated Psych: euthymic mood, full affect   EKG:  SB, 58bpm, RBBB, PR 171ms, QRS 148ms, QTc 459ms  08/10/15: TTE Study Conclusions - Left ventricle: The cavity size was normal. Wall thickness was   increased in a pattern of mild LVH. There was mild focal basal   hypertrophy of the septum. Systolic function was normal. The   estimated ejection fraction was in the range of 60% to 65%. Wall   motion was normal; there were no regional wall motion   abnormalities. Doppler parameters are consistent with abnormal   left ventricular relaxation (grade 1 diastolic dysfunction). - Left atrium: The atrium was moderately dilated. Impressions: - Normal LV systolic function; mild LVH; grade 1 diastolic   dysfunction; moderate LAE; trace TR.  09/05/13: stress myoview Impression Exercise Capacity:  Lexiscan with low level exercise. BP Response:  Normal blood pressure  response. Clinical Symptoms:  Typical symptoms with Lexiscan. ECG Impression:  No significant ST segment change suggestive of ischemia. Comparison with Prior Nuclear Study: No images to compare Overall Impression:  Low risk stress nuclear study with no ischemia identified.. LV Ejection Fraction: 67%  LV Wall Motion:  NL LV Function; NL Wall Motion  Recent Labs: 10/18/2016: ALT 16; BUN 11; Creatinine, Ser 0.64; Hemoglobin 15.3; Magnesium 2.1; Platelets 222.0; Potassium 4.2; Sodium 141  05/02/2016: Cholesterol 165; HDL 55.70; LDL Cholesterol 79; Total CHOL/HDL Ratio 3; Triglycerides 153.0; VLDL 30.6   CrCl cannot be calculated (Patient's most recent lab result is older than the maximum 21 days allowed.).   Wt Readings from Last 3 Encounters:  11/27/16 255 lb (115.7 kg)  10/18/16 251 lb 4 oz (114 kg)  08/21/16 257 lb 9.6 oz (116.8 kg)     Other studies reviewed: Additional studies/records reviewed today include: summarized above  ASSESSMENT AND PLAN:  1. Paroxysmal Afib     CHA2DS2Vasc is 2, on Xarelto, appropriately dosed     She reports compliance with xarelto      She has been using Flecainid OR Porpafenonenow as PRN med only for her AFib, she infrequently will use the propafenone, and never uses both on the same day.  Her last AFib episode took longer then usual to go away, about 12 hours, usually only 4-6.  She also was concerned back in August when she took her Flecainide and resolved the AFib though came back a few hours later, self resolving shortly afterwards.  We discussed that she may require at some point daily medical therapy, though given SB and RBBB (not new), may need to consider alternative to Flecainide or Propafenone.  She for now feels like she is most comfortable with PRN use.  We discussed lifestyle modifications, particularly exercise and weight loss.  2. HTN  Looks OK, no objection to stopping HCTZ, she will disuss further with her PMD,  and calcium  monitoring    Disposition: F/u with AFib clinic in 4 months, sooner if needed.  Current medicines are reviewed at length with the patient today.  The patient did not have any concerns regarding medicines.  Venetia Night, PA-C 11/27/2016 10:37 AM     [facility] [address] [place] [address] [phone] (office)  [phone] (fax)

## 2016-11-27 ENCOUNTER — Ambulatory Visit: Payer: Medicare Other | Admitting: Physician Assistant

## 2016-11-27 DIAGNOSIS — Z79899 Other long term (current) drug therapy: Secondary | ICD-10-CM

## 2016-11-27 DIAGNOSIS — I10 Essential (primary) hypertension: Secondary | ICD-10-CM | POA: Diagnosis not present

## 2016-11-27 DIAGNOSIS — I480 Paroxysmal atrial fibrillation: Secondary | ICD-10-CM | POA: Diagnosis not present

## 2016-11-27 MED ORDER — FUROSEMIDE 20 MG PO TABS: 20.0000 mg | ORAL_TABLET | Freq: Every day | ORAL | 6 refills | Status: DC | PRN

## 2016-11-27 MED ORDER — FLECAINIDE ACETATE 150 MG PO TABS: 300.0000 mg | ORAL_TABLET | ORAL | 6 refills | Status: DC | PRN

## 2016-11-27 MED ORDER — PROPAFENONE HCL 225 MG PO TABS: ORAL_TABLET | ORAL | 6 refills | Status: DC

## 2016-11-27 MED FILL — PROPAFENONE HCL 225 MG TAB: 225 | 30 days supply | Qty: 30 | Fill #0

## 2016-11-27 MED FILL — FUROSEMIDE 20 MG TAB: 20 | 30 days supply | Qty: 30 | Fill #0

## 2016-11-27 MED FILL — FLECAINIDE ACETATE 150 MG T: 150 | 30 days supply | Qty: 30 | Fill #0

## 2016-11-27 NOTE — Patient Instructions (Addendum)
Medication Instructions:   Your physician recommends that you continue on your current medications as directed. Please refer to the Current Medication list given to you today.   If you need a refill on your cardiac medications before your next appointment, please call your pharmacy.  Labwork: NONE ORDERED  TODAY    Testing/Procedures: NONE ORDERED  TODAY    Follow-Up: IN 4 MONTHS WITH THE AFIB CLINIC   Any Other Special Instructions Will Be Listed Below (If Applicable).

## 2016-12-04 ENCOUNTER — Ambulatory Visit: Payer: Medicare Other | Admitting: Internal Medicine

## 2016-12-04 ENCOUNTER — Encounter: Payer: Self-pay | Admitting: Internal Medicine

## 2016-12-04 VITALS — BP 128/72 | HR 56 | Temp 98.1°F | Resp 14 | Ht 68.0 in | Wt 253.2 lb

## 2016-12-04 DIAGNOSIS — G8929 Other chronic pain: Secondary | ICD-10-CM

## 2016-12-04 DIAGNOSIS — K580 Irritable bowel syndrome with diarrhea: Secondary | ICD-10-CM | POA: Diagnosis not present

## 2016-12-04 DIAGNOSIS — M545 Low back pain, unspecified: Secondary | ICD-10-CM

## 2016-12-04 DIAGNOSIS — I10 Essential (primary) hypertension: Secondary | ICD-10-CM

## 2016-12-04 MED ORDER — VALSARTAN 320 MG PO TABS: 320.0000 mg | ORAL_TABLET | Freq: Every day | ORAL | 1 refills | Status: DC

## 2016-12-04 MED ORDER — VALSARTAN 320 MG PO TABS: 320.0000 mg | ORAL_TABLET | Freq: Every day | ORAL | 0 refills | Status: DC

## 2016-12-04 MED ORDER — ACETAMINOPHEN-CODEINE #3 300-30 MG PO TABS: 1.0000 | ORAL_TABLET | Freq: Every day | ORAL | 0 refills | Status: DC | PRN

## 2016-12-04 MED FILL — ACETAMINOPHEN/COD #3 TABLET: 300-30 | 30 days supply | Qty: 30 | Fill #0

## 2016-12-04 MED FILL — VALSARTAN 320 MG TABLET: 320 | 30 days supply | Qty: 30 | Fill #0

## 2016-12-04 NOTE — Progress Notes (Signed)
Subjective:    Patient ID: Veronica Bradford, female    DOB: Jul 10, 1945, 71 y.o.   MRN: [id number]  DOS:  12/04/2016 Type of visit - description : rov Interval history: See last visit, had a number of GI symptoms, saw GI, had upper and lower endoscopy. Overall feels better but he still has occasional diarrhea. A. fib: Recently seen by cardiology. Increase calcium: Note from surgery reviewed, they recommend to DC HCTZ. Chronic pain: Due for a refill on Tylenol 3   Review of Systems Denies chest pain or difficulty breathing.  Still has occasional LE edema Still has occasional palpitations and symptomatic A. fib. Has varicose veins, no actual pain they seem to be more visible here lately.  Past Medical History:  Diagnosis Date  . Back pain   . Chronic sinusitis    s/p 2 surgeries remotely, [name]  . Closed fracture of metatarsal of left foot    L foot, fifth metatarsal  . Ectopic pregnancy   . Endometrial cancer ([place]) 2004   Early diagnosis  . GERD (gastroesophageal reflux disease)   . Hyperlipemia   . Hypertension   . IBS (irritable bowel syndrome) 08/13/2014   Dx 2015, diarrhea on-off, Dr [name]   . Lumbar radiculopathy 08/2012   MRI in 08/2012  . Mild depression ([place])    Pt's son has Down's Syndrome  . Mild hyperparathyroidism ([place])   . Osteopenia   . PAF (paroxysmal atrial fibrillation) ([place])   . Prediabetes   . Sciatica of right side 08/2013   Received Depomedrol 80 mg injection  . Sleep apnea 09/09/2013   Dx with OSA in 2015, by Dr. Caryl Comes, APAP    Past Surgical History:  Procedure Laterality Date  . ABDOMINAL HYSTERECTOMY    . CHOLECYSTECTOMY  ~2006  . DILATION AND CURETTAGE OF UTERUS     after SAB  . ESOPHAGOGASTRODUODENOSCOPY  02/09/2014   Dr. [name]  . FINGER SURGERY Left    index  . NASAL SINUS SURGERY     x 2 remotely, [name]  . TONSILLECTOMY    . TOTAL ABDOMINAL HYSTERECTOMY W/ BILATERAL SALPINGOOPHORECTOMY    . TOTAL KNEE ARTHROPLASTY Right ~2008      Social History   Socioeconomic History  . Marital status: Married    Spouse name: Not on file  . Number of children: 1  . Years of education: Not on file  . Highest education level: Not on file  Social Needs  . Financial resource strain: Not on file  . Food insecurity - worry: Not on file  . Food insecurity - inability: Not on file  . Transportation needs - medical: Not on file  . Transportation needs - non-medical: Not on file  Occupational History  . 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 Smoking status: Former Smoker    Last attempt to quit: 01/06/1980    Years since quitting: 36.9  . Smokeless tobacco: Never Used  Substance and Sexual Activity  . Alcohol use: Yes    Comment: rare  . Drug use: No  . Sexual activity: No  Other Topics Concern  . Not on file  Social History Narrative   Lives w/ husband, and son [name]      Allergies as of 12/04/2016      Reactions   Nsaids Other (See Comments)   Pt on Xarelto   Tolmetin Other (See Comments)   Pt on Xarelto   Zoster Vaccine Live Other (See Comments)  Sickness per Pt.    Amoxicillin Rash      Medication List        Accurate as of 12/04/16  7:05 PM. Always use your most recent med list.          acetaminophen-codeine 300-30 MG tablet Commonly known as:  TYLENOL #3 Take 1 tablet by mouth daily as needed for moderate pain.   CALCIUM CITRATE + PO Take by mouth.   cetirizine 10 MG tablet Commonly known as:  ZYRTEC Take 10 mg by mouth as needed for allergies.   cholecalciferol 1000 units tablet Commonly known as:  VITAMIN D Take 2,000 Units by mouth every other day.   CINNAMON PO Take 0.5 tsp by mouth twice daily   ciprofloxacin 500 MG tablet Commonly known as:  CIPRO Take 500 mg by mouth as needed (prior to dental work).   Co Q 10 10 MG Caps Take 100 mg by mouth daily.   CRANBERRY PO Take 1 capsule by mouth as needed.   diltiazem 30 MG tablet Commonly known as:  CARDIZEM Take one  tablet by mouth every 4 hours AS NEEDED for A-fib HR > 100 as long as BP > 100.   Fish Oil 1200 MG Caps Take 1,200 mg by mouth daily.   flecainide 150 MG tablet Commonly known as:  TAMBOCOR Take 2 tablets (300 mg total) by mouth as needed (FOR AFIB). Pill in a pocket   fluticasone 50 MCG/ACT nasal spray Commonly known as:  FLONASE Place into both nostrils as needed for allergies or rhinitis.   furosemide 20 MG tablet Commonly known as:  LASIX Take 1 tablet (20 mg total) by mouth daily as needed for fluid.   GAVISCON PO Take 1-2 tablets by mouth as needed (indigestion).   IMODIUM A-D 2 MG tablet Generic drug:  loperamide Take 2 mg by mouth. 0.5 tablet bid, increase when has diarrhea   MAGNESIUM-OXIDE PO Take 500 mg by mouth 2 (two) times daily.   MULTIVITAMIN PO Take 1 tablet by mouth daily.   pantoprazole 40 MG tablet Commonly known as:  PROTONIX Take 40 mg by mouth daily.   propafenone 225 MG tablet Commonly known as:  RYTHMOL Take 2 tablets by mouth daily as needed for afib   ranitidine 150 MG capsule Commonly known as:  ZANTAC Take 150 mg by mouth daily as needed for heartburn.   simvastatin 40 MG tablet Commonly known as:  ZOCOR Take 1 tablet (40 mg total) by mouth at bedtime.   valsartan 320 MG tablet Commonly known as:  DIOVAN Take 1 tablet (320 mg total) by mouth daily.   XARELTO 20 MG Tabs tablet Generic drug:  rivaroxaban TAKE 1 TABLET BY MOUTH  DAILY WITH SUPPER          Objective:   Physical Exam BP 128/72 (BP Location: Left Arm, Patient Position: Sitting, Cuff Size: Small)   Pulse (!) 56   Temp 98.1 F (36.7 C) (Oral)   Resp 14   Ht 5\' 8"  (1.727 m)   Wt 253 lb 4 oz (114.9 kg)   SpO2 97%   BMI 38.51 kg/m  General:   Well developed, overweight appearing, no distress HEENT:  Normocephalic . Face symmetric, atraumatic Lungs:  CTA B Normal respiratory effort, no intercostal retractions, no accessory muscle use. Heart: RRR,  no  murmur.  Trace pretibial edema bilaterally  Skin: Not pale. Not jaundice.  Few small superficial varicose veins at the lower extremities, no phlebitis on exam  Neurologic:  alert & oriented X3.  Speech normal, gait appropriate for age and unassisted Psych--  Cognition and judgment appear intact.  Cooperative with normal attention span and concentration.  Behavior appropriate. No anxious or depressed appearing.      Assessment & Plan:    Assessment: Prediabetes HTN Hyperlipidemia Chronic lower extremity edema L>R (eval by previous pcp, related to varicose veins?) CV:  Paroxysmal atrial fibrillation: on xarelto and Rythmol----> pill-in-pocket PVCs -- flecainide prn GI: Dr [name]  --GERD (zantac or protonix prn), IBS, chronic diarrhea --GI records: [place], 2004, 2009 8 and 07-2013. Negative for polyps or IBD. + Diverticuli. Next 202 due to [place]. Had a EGD/cscope 11/2016 d -- IBS, Diarrhea OSA--- CPAP, Dr. Maxwell Caul Osteopenia:  Multiple  Dexas, last two:  T score 2009  -1.1 T score 2015  -0.7 DJD --Chronic back pain-- tylenol #3 prn --- RF per PCP --history of epidurals in the past, MRI 2014: Scoliosis,DJD --CIPRO pre dental d/t knee replacement (rx by ortho) H/o endometrial cancer, found during a hysterectomy, no chemotherapy or XRT. Pap smear 04-2014 within normal per patient Primary hyperparathyroidism: Chronic hypercalcemia, increased PTH 05/2016, refered to ENT  PLAN:  IBS, diarrhea predominant: Had a EGD, colonoscopy with Dr. [name] November 2018, back on Protonix, overall better compared to last visit, still has occasional diarrhea. Hypercalcemia: Surgery suggest to DC HCTZ (she takes Lasix prn for edema) and recheck an intact PTH, calcium and vitamin D by April 2019.  She is to continue vitamin D, she is not taking calcium supplements.  See next. HTN: Will DC HCTZ, continue Diovan 320 mg daily, monitor BPs.  To replace HCTZ we could try beta-blockers but her heart  rate at baseline is in the 50s, amlodipine?  She uses CCB's as needed and she has some baseline edema.  Other options could be clonidine, Cardura.   I discussed this with cardiology, they are okay with amlodipine as long as she does not take diltiazem frequently.  All this was discussed with the patient we agreed to simply stop the HCTZ, watch diet and salt intake, monitor BPs.  If needed will start amlodipine (noting that simvastatin will have to be changed to Lipitor due to interactions) BMP in  2 weeks DJD: Refill Tylenol 3, get a UDS Varicose veins, at some point would like to have treatment.  That is okay.  I   refer to IR when she is ready RTC  05/2017

## 2016-12-04 NOTE — Assessment & Plan Note (Signed)
IBS, diarrhea predominant: Had a EGD, colonoscopy with Dr. [name] November 2018, back on Protonix, overall better compared to last visit, still has occasional diarrhea. Hypercalcemia: Surgery suggest to DC HCTZ (she takes Lasix prn for edema) and recheck an intact PTH, calcium and vitamin D by April 2019.  She is to continue vitamin D, she is not taking calcium supplements.  See next. HTN: Will DC HCTZ, continue Diovan 320 mg daily, monitor BPs.  To replace HCTZ we could try beta-blockers but her heart rate at baseline is in the 50s, amlodipine?  She uses CCB's as needed and she has some baseline edema.  Other options could be clonidine, Cardura.   I discussed this with cardiology, they are okay with amlodipine as long as she does not take diltiazem frequently.  All this was discussed with the patient we agreed to simply stop the HCTZ, watch diet and salt intake, monitor BPs.  If needed will start amlodipine (noting that simvastatin will have to be changed to Lipitor due to interactions) BMP in  2 weeks DJD: Refill Tylenol 3, get a UDS Varicose veins, at some point would like to have treatment.  That is okay.  I   refer to IR when she is ready RTC  05/2017

## 2016-12-04 NOTE — Patient Instructions (Addendum)
Go to the lab, provide a urine sample  GO TO THE FRONT DESK Schedule your next appointment for a physical exam by May 2019  Schedule labs to be done 2 weeks from today, BMP, HTN  Stop Diovan HCT, start Diovan 320 mg 1 tablet daily  Check the  blood pressure 2 or 3 times a week Be sure your blood pressure is between 110/65 and  135/85. If it is consistently higher or lower, let me know

## 2016-12-04 NOTE — Progress Notes (Signed)
Pre visit review using our clinic review tool, if applicable. No additional management support is needed unless otherwise documented below in the visit note. 

## 2016-12-07 DIAGNOSIS — G4733 Obstructive sleep apnea (adult) (pediatric): Secondary | ICD-10-CM | POA: Diagnosis not present

## 2016-12-09 LAB — PAIN MGMT, PROFILE 8 W/CONF, U
6 Acetylmorphine: NEGATIVE ng/mL (ref <10)
ALPHAHYDROXYALPRAZOLAM: NEGATIVE ng/mL (ref <25)
AMPHETAMINES: NEGATIVE ng/mL (ref <500)
Alcohol Metabolites: NEGATIVE ng/mL (ref <500)
Alphahydroxymidazolam: NEGATIVE ng/mL (ref <50)
Alphahydroxytriazolam: NEGATIVE ng/mL (ref <50)
Aminoclonazepam: NEGATIVE ng/mL (ref <25)
Benzodiazepines: NEGATIVE ng/mL (ref <100)
Buprenorphine, Urine: NEGATIVE ng/mL (ref <5)
Buprenorphine: NEGATIVE ng/mL (ref <2)
COCAINE METABOLITE: NEGATIVE ng/mL (ref <150)
CREATININE: 124.9 mg/dL
Hydroxyethylflurazepam: NEGATIVE ng/mL (ref <50)
LORAZEPAM: NEGATIVE ng/mL (ref <50)
MDMA: NEGATIVE ng/mL (ref <500)
Marijuana Metabolite: NEGATIVE ng/mL (ref <20)
NORBUPRENORPHINE: NEGATIVE ng/mL (ref <2)
NORDIAZEPAM: NEGATIVE ng/mL (ref <50)
OPIATES: NEGATIVE ng/mL (ref <100)
OXIDANT: NEGATIVE ug/mL (ref <200)
Oxazepam: NEGATIVE ng/mL (ref <50)
Oxycodone: NEGATIVE ng/mL (ref <100)
PH: 7.17 (ref 4.5–9.0)
Temazepam: NEGATIVE ng/mL (ref <50)

## 2016-12-13 ENCOUNTER — Other Ambulatory Visit: Payer: Self-pay

## 2016-12-13 ENCOUNTER — Emergency Department (HOSPITAL_BASED_OUTPATIENT_CLINIC_OR_DEPARTMENT_OTHER)
Admission: EM | Admit: 2016-12-13 | Discharge: 2016-12-14 | Disposition: A | Discharge status: Home / Self Care | Payer: Medicare Other | Attending: Emergency Medicine | Admitting: Emergency Medicine

## 2016-12-13 ENCOUNTER — Encounter (HOSPITAL_BASED_OUTPATIENT_CLINIC_OR_DEPARTMENT_OTHER): Payer: Self-pay

## 2016-12-13 DIAGNOSIS — Z79899 Other long term (current) drug therapy: Secondary | ICD-10-CM | POA: Diagnosis not present

## 2016-12-13 DIAGNOSIS — I10 Essential (primary) hypertension: Secondary | ICD-10-CM | POA: Diagnosis not present

## 2016-12-13 DIAGNOSIS — Z96651 Presence of right artificial knee joint: Secondary | ICD-10-CM | POA: Diagnosis not present

## 2016-12-13 DIAGNOSIS — Z7901 Long term (current) use of anticoagulants: Secondary | ICD-10-CM | POA: Diagnosis not present

## 2016-12-13 DIAGNOSIS — Z87891 Personal history of nicotine dependence: Secondary | ICD-10-CM | POA: Insufficient documentation

## 2016-12-13 DIAGNOSIS — R002 Palpitations: Secondary | ICD-10-CM | POA: Diagnosis present

## 2016-12-13 DIAGNOSIS — I4891 Unspecified atrial fibrillation: Secondary | ICD-10-CM

## 2016-12-13 LAB — CBC
HCT: 45.5 % (ref 36.0–46.0)
Hemoglobin: 15 g/dL (ref 12.0–15.0)
MCH: 29.4 pg (ref 26.0–34.0)
MCHC: 33 g/dL (ref 30.0–36.0)
MCV: 89 fL (ref 78.0–100.0)
PLATELETS: 237 10*3/uL (ref 150–400)
RBC: 5.11 MIL/uL (ref 3.87–5.11)
RDW: 14.8 % (ref 11.5–15.5)
WBC: 8.4 10*3/uL (ref 4.0–10.5)

## 2016-12-13 LAB — BASIC METABOLIC PANEL
Anion gap: 7 (ref 5–15)
BUN: 15 mg/dL (ref 6–20)
CALCIUM: 10.1 mg/dL (ref 8.9–10.3)
CO2: 26 mmol/L (ref 22–32)
CREATININE: 0.6 mg/dL (ref 0.44–1.00)
Chloride: 108 mmol/L (ref 101–111)
Glucose, Bld: 102 mg/dL — ABNORMAL HIGH (ref 65–99)
Potassium: 4.1 mmol/L (ref 3.5–5.1)
SODIUM: 141 mmol/L (ref 135–145)

## 2016-12-13 LAB — TROPONIN I

## 2016-12-13 MED ORDER — RIVAROXABAN 20 MG PO TABS
20.0000 mg | ORAL_TABLET | Freq: Every day | ORAL | Status: DC
  Administered 2016-12-13: 20 mg via ORAL
  Filled 2016-12-13: qty 1

## 2016-12-13 MED ORDER — DILTIAZEM HCL 25 MG/5ML IV SOLN
10.0000 mg | Freq: Once | INTRAVENOUS | Status: AC
  Administered 2016-12-13: 10 mg via INTRAVENOUS
  Filled 2016-12-13: qty 5

## 2016-12-13 MED ORDER — DILTIAZEM HCL 25 MG/5ML IV SOLN
10.0000 mg | Freq: Once | INTRAVENOUS | Status: AC
  Administered 2016-12-13: 10 mg via INTRAVENOUS

## 2016-12-13 MED ORDER — SODIUM CHLORIDE 0.9 % IV BOLUS (SEPSIS)
250.0000 mL | Freq: Once | INTRAVENOUS | Status: AC
  Administered 2016-12-13: 250 mL via INTRAVENOUS

## 2016-12-13 NOTE — ED Provider Notes (Signed)
[facility]  [facility] EMERGENCY DEPARTMENT Provider Note   CSN: [id number] Arrival date & time: 12/13/16  1811     History   Chief Complaint Chief Complaint  Patient presents with  . Tachycardia    HPI Veronica Bradford is a 71 y.o. female.  HPI  71 y.o. female with a hx of HTN, HLD, PAF on Xarelto, presents to the Emergency Department today due to palpitations around 3pm. Hx same x 10 days ago. Took propafenone 300mg  as Cardiologist instructed without change. Denies chest pain or SOB. No abdominal pain. No N/V. No diaphoresis. No numbness/tingling. Pt states she started to have even higher heart rate PTA. Checked and it was in 200s. Valsalva maneuvers with reductions. HR remained leveated around 100-120bpm. Pt states baseline HR 50-60s. 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 No cough/congestion. No other symptoms noted     Past Medical History:  Diagnosis Date  . Back pain   . Chronic sinusitis    s/p 2 surgeries remotely, [name]  . Closed fracture of metatarsal of left foot    L foot, fifth metatarsal  . Ectopic pregnancy   . Endometrial cancer ([place]) 2004   Early diagnosis  . GERD (gastroesophageal reflux disease)   . Hyperlipemia   . Hypertension   . IBS (irritable bowel syndrome) 08/13/2014   Dx 2015, diarrhea on-off, Dr [name]   . Lumbar radiculopathy 08/2012   MRI in 08/2012  . Mild depression ([place])    Pt's son has Down's Syndrome  . Mild hyperparathyroidism ([place])   . Osteopenia   . PAF (paroxysmal atrial fibrillation) ([place])   . Prediabetes   . Sciatica of right side 08/2013   Received Depomedrol 80 mg injection  . Sleep apnea 09/09/2013   Dx with OSA in 2015, by Dr. Caryl Comes, APAP    Patient Active Problem List   Diagnosis Date Noted  . Annual physical exam 04/14/2015  . PCP NOTES >>>>> 12/23/2014  . IBS (irritable bowel syndrome) 08/13/2014  . Hyperglycemia   . Chronic sinusitis   . Endometrial cancer ([place])   . Back pain-- on tylenol #3 prn   . GERD  (gastroesophageal reflux disease)   . HTN (hypertension) 09/09/2013  . Diastolic dysfunction-grade 2 09/09/2013  . Chronic anticoagulation 09/09/2013  . Edema of both legs 09/09/2013  . Dyslipidemia 09/09/2013  . Obesity (BMI 30-39.9) 09/09/2013  . Sleep apnea-- Dr Maxwell Caul, on CPAP 09/09/2013  . PAF (paroxysmal atrial fibrillation) ([place]) 06/20/2013    Past Surgical History:  Procedure Laterality Date  . ABDOMINAL HYSTERECTOMY    . CHOLECYSTECTOMY  ~2006  . DILATION AND CURETTAGE OF UTERUS     after SAB  . ESOPHAGOGASTRODUODENOSCOPY  02/09/2014   Dr. [name]  . FINGER SURGERY Left    index  . NASAL SINUS SURGERY     x 2 remotely, [name]  . TONSILLECTOMY    . TOTAL ABDOMINAL HYSTERECTOMY W/ BILATERAL SALPINGOOPHORECTOMY    . TOTAL KNEE ARTHROPLASTY Right ~2008    OB History    Gravida Para Term Preterm AB Living   4 1 1   3 13    SAB TAB Ectopic Multiple Live Births   3               Home Medications    Prior to Admission medications   Medication Sig Start Date End Date Taking? Authorizing Provider  acetaminophen-codeine (TYLENOL #3) 300-30 MG tablet Take 1 tablet by mouth daily as needed for moderate pain. 12/04/16  Colon Branch, MD  Alum Hydroxide-Mag Carbonate (GAVISCON PO) Take 1-2 tablets by mouth as needed (indigestion).    [provider]  Calcium Citrate-Vitamin D (CALCIUM CITRATE + PO) Take by mouth.    [provider]  cetirizine (ZYRTEC) 10 MG tablet Take 10 mg by mouth as needed for allergies.    [provider]  cholecalciferol (VITAMIN D) 1000 UNITS tablet Take 2,000 Units by mouth every other day.    [provider]  CINNAMON PO Take 0.5 tsp by mouth twice daily    [provider]  ciprofloxacin (CIPRO) 500 MG tablet Take 500 mg by mouth as needed (prior to dental work).    [provider]  Coenzyme Q10 (CO Q 10) 10 MG CAPS Take 100 mg by mouth daily.     [provider]  CRANBERRY PO Take 1  capsule by mouth as needed.     [provider]  diltiazem (CARDIZEM) 30 MG tablet Take one tablet by mouth every 4 hours AS NEEDED for A-fib HR > 100 as long as BP > 100. 08/21/16   Sherran Needs, NP  flecainide (TAMBOCOR) 150 MG tablet Take 2 tablets (300 mg total) by mouth as needed (FOR AFIB). Pill in a pocket 11/27/16   Baldwin Jamaica, PA-C  fluticasone [facility]) 50 MCG/ACT nasal spray Place into both nostrils as needed for allergies or rhinitis.    [provider]  furosemide (LASIX) 20 MG tablet Take 1 tablet (20 mg total) by mouth daily as needed for fluid. 11/27/16   Baldwin Jamaica, PA-C  loperamide (IMODIUM A-D) 2 MG tablet Take 2 mg by mouth. 0.5 tablet bid, increase when has diarrhea    [provider]  MAGNESIUM-OXIDE PO Take 500 mg by mouth 2 (two) times daily.    [provider]  Multiple Vitamins-Minerals (MULTIVITAMIN PO) Take 1 tablet by mouth daily.    [provider]  Omega-3 Fatty Acids (FISH OIL) 1200 MG CAPS Take 1,200 mg by mouth daily.    [provider]  pantoprazole (PROTONIX) 40 MG tablet Take 40 mg by mouth daily.    [provider]  propafenone (RYTHMOL) 225 MG tablet Take 2 tablets by mouth daily as needed for afib 11/27/16   Baldwin Jamaica, PA-C  ranitidine (ZANTAC) 150 MG capsule Take 150 mg by mouth daily as needed for heartburn.    [provider]  simvastatin (ZOCOR) 40 MG tablet Take 1 tablet (40 mg total) by mouth at bedtime. 06/19/16   Colon Branch, MD  valsartan (DIOVAN) 320 MG tablet Take 1 tablet (320 mg total) by mouth daily. 12/04/16   Colon Branch, MD  XARELTO 20 MG TABS tablet TAKE 1 TABLET BY MOUTH  DAILY WITH SUPPER 10/31/16   [name], MD    Family History Family History  Problem Relation Age of Onset  . Hypertension Mother   . Colon cancer Mother   . Breast cancer Mother   . Ovarian cancer Mother   . Hypertension Father   . Diabetes Father   . Head & neck  cancer Sister   . CAD Neg Hx     Social History Social History   Tobacco Use  . Smoking status: Former Smoker    Last attempt to quit: 01/06/1980    Years since quitting: 36.9  . Smokeless tobacco: Never Used  Substance Use Topics  . Alcohol use: Yes    Comment: rare  .  Drug use: No     Allergies   Nsaids; Tolmetin; Zoster vaccine live; and Amoxicillin   Review of Systems Review of Systems ROS reviewed and all are negative for acute change except as noted in the HPI.  Physical Exam Updated Vital Signs BP (!) 155/86 (BP Location: Right Arm)   Pulse (!) 170   Temp 98.1 F (36.7 C) (Oral)   Resp 20   Ht 5\' 8"  (1.727 m)   Wt 112.5 kg (248 lb)   SpO2 100%   BMI 37.71 kg/m   Physical Exam  Constitutional: She is oriented to person, place, and time. She appears well-developed and well-nourished. No distress.  HENT:  Head: Normocephalic and atraumatic.  Right Ear: Tympanic membrane, external ear and ear canal normal.  Left Ear: Tympanic membrane, external ear and ear canal normal.  Nose: Nose normal.  Mouth/Throat: Uvula is midline, oropharynx is clear and moist and mucous membranes are normal. No trismus in the jaw. No oropharyngeal exudate, posterior oropharyngeal erythema or tonsillar abscesses.  Eyes: EOM are normal. Pupils are equal, round, and reactive to light.  Neck: Normal range of motion. Neck supple. No tracheal deviation present.  Cardiovascular: S1 normal, S2 normal, normal heart sounds, intact distal pulses and normal pulses. An irregularly irregular rhythm present. Tachycardia present.  Pulmonary/Chest: Effort normal and breath sounds normal. No respiratory distress. She has no decreased breath sounds. She has no wheezes. She has no rhonchi. She has no rales.  Abdominal: Normal appearance and bowel sounds are normal. There is no tenderness.  Musculoskeletal: Normal range of motion.  Neurological: She is alert and oriented to person, place, and time.  Skin:  Skin is warm and dry.  Psychiatric: She has a normal mood and affect. Her speech is normal and behavior is normal. Thought content normal.  Nursing note and vitals reviewed.  ED Treatments / Results  Labs (all labs ordered are listed, but only abnormal results are displayed) Labs Reviewed  BASIC METABOLIC PANEL - Abnormal; Notable for the following components:      Result Value   Glucose, Bld 102 (*)    All other components within normal limits  CBC  TROPONIN I    EKG  EKG Interpretation  Date/Time:  Wednesday December 13 2016 18:32:45 EST Ventricular Rate:  82 PR Interval:    QRS Duration: 181 QT Interval:  404 QTC Calculation: 472 R Axis:   -44 Text Interpretation:  Atrial fibrillation RBBB and LAFB Probable left ventricular hypertrophy Abnormal T, consider ischemia, lateral leads no sig change from previous Confirmed by Charlesetta Shanks [phone]) on 12/13/2016 7:53:45 PM       Radiology No results found.  Procedures Procedures (including critical care time) CRITICAL CARE Performed by: Ozella Rocks   Total critical care time: 60 minutes  Critical care time was exclusive of separately billable procedures and treating other patients.  Critical care was necessary to treat or prevent imminent or life-threatening deterioration.  Critical care was time spent personally by me on the following activities: development of treatment plan with patient and/or surrogate as well as nursing, discussions with consultants, evaluation of patient's response to treatment, examination of patient, obtaining history from patient or surrogate, ordering and performing treatments and interventions, ordering and review of laboratory studies, ordering and review of radiographic studies, pulse oximetry and re-evaluation of patient's condition.   Medications Ordered in ED Medications  rivaroxaban (XARELTO) tablet 20 mg (20 mg Oral Given 12/13/16 2345)  diltiazem (CARDIZEM) injection 10 mg (10  mg  Intravenous Given 12/13/16 2015)  sodium chloride 0.9 % bolus 250 mL (0 mLs Intravenous Stopped 12/13/16 2015)  diltiazem (CARDIZEM) injection 10 mg (10 mg Intravenous Given 12/13/16 2255)  sodium chloride 0.9 % bolus 250 mL (0 mLs Intravenous Stopped 12/13/16 2301)  diltiazem (CARDIZEM) injection 10 mg (10 mg Intravenous Given 12/13/16 2328)     Initial Impression / Assessment and Plan / ED Course  I have reviewed the triage vital signs and the nursing notes.  Pertinent labs & imaging results that were available during my care of the patient were reviewed by me and considered in my medical decision making (see chart for details).  Final Clinical Impressions(s) / ED Diagnoses  {I have reviewed and evaluated the relevant laboratory values. {I have reviewed and evaluated the relevant imaging studies. {I have interpreted the relevant EKG. {I have reviewed the relevant previous healthcare records.  {I obtained HPI from historian. {Patient discussed with supervising physician.  ED Course:  Assessment: Pt is a 71 y.o. female with a hx of HTN, HLD, PAF on Xarelto, presents to the Emergency Department today due to palpitations around 3pm. Hx same x 10 days ago. Took propafenone 300mg  as Cardiologist instructed without change. Denies chest pain or SOB. No abdominal pain. No N/V. No diaphoresis. No numbness/tingling. Pt states she started to have even higher heart rate PTA. Checked and it was in 200s. Valsalva maneuvers with reductions. HR remained leveated around 100-120bpm. Pt states baseline HR 50-60s. 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 No cough/congestion. On exam, pt in NAD. Nontoxic/nonseptic appearing. VS with tachycardia. Normotensive. Afebrile. Lungs CTA. Abdomen nontender soft. EKG shows Afib with RVR. Given 10mg  Diltazem with improvement of HR. Given additional 10mg  Diltiazem x2 with HR 70s. Labs unremarkable. CHAD2Vasc 3. Observed in ED. Consult to Cardiology (Dr.[email or web address] Will Rx Diltz ER 120 to take  Daily. Pt can take 30mg  Dilt q4h before she fills Rx. Plan is to DC home with close follow up to Cardiology. Pt main concern was elevated HR with improvement with Valsalva. Potentially SVT, but patient without symptoms in ED or on EKG. Potentially just Afib RVR. At time of discharge, Patient is in no acute distress. Vital Signs are stable. Patient is able to ambulate. Patient able to tolerate PO.    Disposition/Plan:  DC Home Additional Verbal discharge instructions given and discussed with patient.  Pt Instructed to f/u with Cardiology in the next week for evaluation and treatment of symptoms. Return precautions given Pt acknowledges and agrees with plan  Supervising Physician Charlesetta Shanks, MD  Final diagnoses:  Atrial fibrillation with rapid ventricular response [facility])    ED Discharge Orders    None       Shary Decamp, PA-C 12/14/16 0017    Charlesetta Shanks, MD 12/15/16 [phone]

## 2016-12-13 NOTE — ED Notes (Signed)
ED Provider at bedside. 

## 2016-12-13 NOTE — ED Notes (Signed)
ED Provider at bedside discussing consult with Cardiology and dispo plan of care

## 2016-12-13 NOTE — ED Notes (Signed)
[place] bar and Trail mix given per pt request.

## 2016-12-13 NOTE — ED Notes (Signed)
ED Provider at bedside, Dr. Johnney Killian.

## 2016-12-13 NOTE — ED Notes (Signed)
Pt walked to bathroom with no pain and brisk gait.  HR up to 177 upon return to room.  HR back down to 100 within 2 min of sitting down on bed.

## 2016-12-13 NOTE — ED Triage Notes (Signed)
C/o elevated HR x today-hx of Afib-NAD

## 2016-12-14 MED ORDER — DILTIAZEM HCL ER 120 MG PO CP12: 120.0000 mg | ORAL_CAPSULE | Freq: Every day | ORAL | 0 refills | Status: DC

## 2016-12-14 NOTE — Discharge Instructions (Signed)
Please read and follow all provided instructions.  Your diagnoses today include:  1. Atrial fibrillation with rapid ventricular response (HCC)     Medications prescribed:  Take as prescribed   Home care instructions:  Follow any educational materials contained in this packet.  Follow-up instructions: Please follow-up with your Cardiologist for further evaluation of symptoms and treatment   Return instructions:  Please return to the Emergency Department if you do not get better, if you get worse, or new symptoms OR  - Fever (temperature greater than 101.60F)  - Bleeding that does not stop with holding pressure to the area    -Severe pain (please note that you may be more sore the day after your accident)  - Chest Pain  - Difficulty breathing  - Severe nausea or vomiting  - Inability to tolerate food and liquids  - Passing out  - Skin becoming red around your wounds  - Change in mental status (confusion or lethargy)  - New numbness or weakness    Please return if you have any other emergent concerns.  Additional Information:  Your vital signs today were: BP 123/67    Pulse 79    Temp 98.1 F (36.7 C) (Oral)    Resp 14    Ht 5\' 8"  (1.727 m)    Wt 112.5 kg (248 lb)    SpO2 100%    BMI 37.71 kg/m  If your blood pressure (BP) was elevated above 135/85 this visit, please have this repeated by your doctor within one month. ---------------

## 2016-12-15 ENCOUNTER — Telehealth: Payer: Self-pay | Admitting: Internal Medicine

## 2016-12-15 NOTE — Telephone Encounter (Signed)
F/U Call:  Patient returning call states that if you cant reach her on number listed, please try [phone].

## 2016-12-15 NOTE — Telephone Encounter (Signed)
Left message for patient to call back  

## 2016-12-15 NOTE — Telephone Encounter (Signed)
New Message  Pt call requesting to speak with RN about her hospital visit on 12/12. Please call back to discuss

## 2016-12-15 NOTE — Telephone Encounter (Signed)
Patient calling about several questions. Patient was wondering if propafenone (Rythmol) could have caused her to go into SVT? Should she be concerned about going into SVT or was this just a one time thing? Patient stated she is doing fine now and HR is staying in the 50's. Patient wanted to also let you know she is not taking diltiazem extended release that was prescribed during her ER visit. She does not feel that it would be safe to take with her HR being in the 50's. Patient was also wondering if she would be fine going on vacation to [place] for a couple of months. Patient requested Dr. Caryl Comes to call her back at [phone] or [phone]. Will forward to Dr. Caryl Comes.

## 2016-12-15 NOTE — Telephone Encounter (Signed)
HER SVT was in fact afib  I agree with slow HR would not take dilt but could use it as needed  The frequency of these event will determine what we may want to do next-- change meds or ablation --   [place] should be warm and sunny  Have fun and [employer]

## 2016-12-18 ENCOUNTER — Other Ambulatory Visit (INDEPENDENT_AMBULATORY_CARE_PROVIDER_SITE_OTHER): Payer: Medicare Other

## 2016-12-18 ENCOUNTER — Encounter: Payer: Self-pay | Admitting: Internal Medicine

## 2016-12-18 ENCOUNTER — Telehealth: Payer: Self-pay | Admitting: Internal Medicine

## 2016-12-18 DIAGNOSIS — I10 Essential (primary) hypertension: Secondary | ICD-10-CM | POA: Diagnosis not present

## 2016-12-18 DIAGNOSIS — R1012 Left upper quadrant pain: Secondary | ICD-10-CM | POA: Diagnosis not present

## 2016-12-18 DIAGNOSIS — G4733 Obstructive sleep apnea (adult) (pediatric): Secondary | ICD-10-CM | POA: Diagnosis not present

## 2016-12-18 DIAGNOSIS — G43A0 Cyclical vomiting, not intractable: Secondary | ICD-10-CM | POA: Diagnosis not present

## 2016-12-18 DIAGNOSIS — K580 Irritable bowel syndrome with diarrhea: Secondary | ICD-10-CM | POA: Diagnosis not present

## 2016-12-18 LAB — BASIC METABOLIC PANEL
BUN: 15 mg/dL (ref 6–23)
CHLORIDE: 108 meq/L (ref 96–112)
CO2: 28 mEq/L (ref 19–32)
CREATININE: 0.62 mg/dL (ref 0.40–1.20)
Calcium: 9.9 mg/dL (ref 8.4–10.5)
GFR: 101.44 mL/min (ref >60.00)
GLUCOSE: 86 mg/dL (ref 70–99)
Potassium: 3.8 mEq/L (ref 3.5–5.1)
Sodium: 142 mEq/L (ref 135–145)

## 2016-12-18 MED ORDER — VALSARTAN 320 MG PO TABS: 320.0000 mg | ORAL_TABLET | Freq: Every day | ORAL | 1 refills | Status: DC

## 2016-12-18 NOTE — Telephone Encounter (Signed)
Rx sent 

## 2016-12-18 NOTE — Telephone Encounter (Signed)
Called patient about Dr. Olin Pia message. Patient verbalized understanding and had no other questions at this time.

## 2016-12-18 NOTE — Telephone Encounter (Signed)
Copied from [place]. Topic: Quick Communication - See Telephone Encounter >> Dec 18, 2016  8:02 AM Rosalin Hawking wrote: CRM for notification. See Telephone encounter for:  12/18/16.   Pt stated is leaving out of town on Dec 25, 2016 and is needing a refill on her valsartan (DIOVAN) 320 MG tablet, pt stated is wanting to have 3 month supply. Please advise pt when rx sent to pharmacy since she needs to get it before the 24 th of Dec.

## 2016-12-20 MED FILL — dilTIAZem HCL 30 MG TABS: 30 | 5 days supply | Qty: 30 | Fill #0

## 2016-12-21 ENCOUNTER — Other Ambulatory Visit: Payer: Self-pay

## 2016-12-21 MED ORDER — VALSARTAN 320 MG PO TABS: 320.0000 mg | ORAL_TABLET | Freq: Every day | ORAL | 1 refills | Status: DC

## 2016-12-22 ENCOUNTER — Telehealth: Payer: Self-pay | Admitting: Internal Medicine

## 2016-12-22 DIAGNOSIS — E785 Hyperlipidemia, unspecified: Secondary | ICD-10-CM

## 2016-12-22 NOTE — Telephone Encounter (Signed)
Letter from his pharmacy reviewed, there is a interaction between simvastatin and diltiazem.  Advised patient rec to stop simvastatin and switch her to Pravachol 40 mg 1 p.o. daily. If she agrees, send a prescription, schedule AST ALT  FLP 6 weeks from today.

## 2016-12-25 MED ORDER — PRAVASTATIN SODIUM 40 MG PO TABS: 40.0000 mg | ORAL_TABLET | Freq: Every day | ORAL | 0 refills | Status: DC

## 2016-12-25 NOTE — Telephone Encounter (Signed)
MyChart message sent to Pt informing of recommendations. Pravastatin sent to [facility]. AST, ALT, FLP ordered to be completed in 6 weeks. Instructed Pt to call office to schedule lab appt (fasting).

## 2016-12-27 MED FILL — VALSARTAN 320 MG TABLET: 320 | 90 days supply | Qty: 90 | Fill #0

## 2017-01-02 DIAGNOSIS — R1084 Generalized abdominal pain: Secondary | ICD-10-CM | POA: Diagnosis not present

## 2017-01-02 DIAGNOSIS — K580 Irritable bowel syndrome with diarrhea: Secondary | ICD-10-CM | POA: Diagnosis not present

## 2017-01-03 DIAGNOSIS — R1084 Generalized abdominal pain: Secondary | ICD-10-CM | POA: Diagnosis not present

## 2017-01-03 LAB — BASIC METABOLIC PANEL
BUN: 16 (ref 4–21)
CREATININE: 0.7 (ref 0.5–1.1)
GLUCOSE: 129
POTASSIUM: 4.1 (ref 3.4–5.3)
Sodium: 142 (ref 137–147)

## 2017-01-03 LAB — HEPATIC FUNCTION PANEL
ALT: 49 — AB (ref 7–35)
AST: 52 — AB (ref 13–35)
Alkaline Phosphatase: 132 — AB (ref 25–125)
Bilirubin, Direct: 0.2 (ref 0.01–0.4)
Bilirubin, Total: 0.6

## 2017-01-03 LAB — CBC AND DIFFERENTIAL
HEMATOCRIT: 48 — AB (ref 36–46)
HEMOGLOBIN: 15.9 (ref 12.0–16.0)
Neutrophils Absolute: 7
Platelets: 254 (ref 150–399)
WBC: 10.3

## 2017-01-15 ENCOUNTER — Encounter (HOSPITAL_COMMUNITY): Payer: Self-pay | Admitting: Nurse Practitioner

## 2017-01-15 DIAGNOSIS — R609 Edema, unspecified: Secondary | ICD-10-CM | POA: Diagnosis not present

## 2017-01-15 DIAGNOSIS — E119 Type 2 diabetes mellitus without complications: Secondary | ICD-10-CM | POA: Diagnosis not present

## 2017-01-15 DIAGNOSIS — I4891 Unspecified atrial fibrillation: Secondary | ICD-10-CM | POA: Diagnosis not present

## 2017-01-15 DIAGNOSIS — K589 Irritable bowel syndrome without diarrhea: Secondary | ICD-10-CM | POA: Diagnosis not present

## 2017-01-15 DIAGNOSIS — I10 Essential (primary) hypertension: Secondary | ICD-10-CM | POA: Diagnosis not present

## 2017-01-15 MED ORDER — DILTIAZEM HCL 30 MG PO TABS: ORAL_TABLET | ORAL | 1 refills | Status: DC

## 2017-02-12 ENCOUNTER — Other Ambulatory Visit: Payer: Self-pay | Admitting: Internal Medicine

## 2017-03-19 ENCOUNTER — Telehealth: Payer: Self-pay | Admitting: *Deleted

## 2017-03-19 ENCOUNTER — Other Ambulatory Visit (INDEPENDENT_AMBULATORY_CARE_PROVIDER_SITE_OTHER): Payer: Medicare Other

## 2017-03-19 DIAGNOSIS — E213 Hyperparathyroidism, unspecified: Secondary | ICD-10-CM

## 2017-03-19 DIAGNOSIS — E785 Hyperlipidemia, unspecified: Secondary | ICD-10-CM | POA: Diagnosis not present

## 2017-03-19 LAB — LIPID PANEL
CHOL/HDL RATIO: 3
CHOLESTEROL: 177 mg/dL (ref 0–200)
HDL: 58 mg/dL (ref >39.00)
LDL CALC: 98 mg/dL (ref 0–99)
NonHDL: 118.76
TRIGLYCERIDES: 104 mg/dL (ref 0.0–149.0)
VLDL: 20.8 mg/dL (ref 0.0–40.0)

## 2017-03-19 LAB — AST: AST: 19 U/L (ref 0–37)

## 2017-03-19 LAB — ALT: ALT: 20 U/L (ref 0–35)

## 2017-03-19 NOTE — Telephone Encounter (Signed)
Received Physician Orders from [facility] requesting to have labs for pt drawn in our office "in the first part of April and send results to them at [phone]; forwarded to provider/SLS

## 2017-03-22 NOTE — Addendum Note (Signed)
Addended byDamita Dunnings D on: 03/22/2017 01:18 PM   Modules accepted: Orders

## 2017-03-22 NOTE — Telephone Encounter (Signed)
Surgery Dr. Harle Stanford request patient to have PTH, calcium and vitamin D drawn in April 2019.  DX hypercalcemia.  Will do.

## 2017-03-22 NOTE — Telephone Encounter (Signed)
Orders placed.

## 2017-03-28 ENCOUNTER — Encounter: Payer: Self-pay | Admitting: Internal Medicine

## 2017-03-28 ENCOUNTER — Telehealth: Payer: Self-pay | Admitting: Internal Medicine

## 2017-03-28 NOTE — Telephone Encounter (Signed)
Requesting:acetaminophen-codeine (TYLENOL #3) 300-30 MG tablet Contract:06/20/16 UDS:12/04/16 Last Visit:12/04/16 Next Visit:none with pcp Last Refill:12/04/16 #30 0-R  Please Advise

## 2017-03-29 ENCOUNTER — Encounter (HOSPITAL_COMMUNITY): Payer: Self-pay | Admitting: Nurse Practitioner

## 2017-03-29 ENCOUNTER — Ambulatory Visit (HOSPITAL_COMMUNITY)
Admission: RE | Admit: 2017-03-29 | Discharge: 2017-03-29 | Disposition: A | Discharge status: Home / Self Care | Payer: Medicare Other | Source: Ambulatory Visit | Attending: Nurse Practitioner | Admitting: Nurse Practitioner

## 2017-03-29 VITALS — BP 136/68 | HR 58 | Ht 68.0 in | Wt 249.8 lb

## 2017-03-29 DIAGNOSIS — Z886 Allergy status to analgesic agent status: Secondary | ICD-10-CM | POA: Insufficient documentation

## 2017-03-29 DIAGNOSIS — Z90710 Acquired absence of both cervix and uterus: Secondary | ICD-10-CM | POA: Diagnosis not present

## 2017-03-29 DIAGNOSIS — G4733 Obstructive sleep apnea (adult) (pediatric): Secondary | ICD-10-CM | POA: Insufficient documentation

## 2017-03-29 DIAGNOSIS — Z880 Allergy status to penicillin: Secondary | ICD-10-CM | POA: Diagnosis not present

## 2017-03-29 DIAGNOSIS — M5416 Radiculopathy, lumbar region: Secondary | ICD-10-CM | POA: Diagnosis not present

## 2017-03-29 DIAGNOSIS — E785 Hyperlipidemia, unspecified: Secondary | ICD-10-CM | POA: Diagnosis not present

## 2017-03-29 DIAGNOSIS — Z79899 Other long term (current) drug therapy: Secondary | ICD-10-CM | POA: Insufficient documentation

## 2017-03-29 DIAGNOSIS — F329 Major depressive disorder, single episode, unspecified: Secondary | ICD-10-CM | POA: Diagnosis not present

## 2017-03-29 DIAGNOSIS — M8580 Other specified disorders of bone density and structure, unspecified site: Secondary | ICD-10-CM | POA: Insufficient documentation

## 2017-03-29 DIAGNOSIS — Z833 Family history of diabetes mellitus: Secondary | ICD-10-CM | POA: Diagnosis not present

## 2017-03-29 DIAGNOSIS — Z96651 Presence of right artificial knee joint: Secondary | ICD-10-CM | POA: Insufficient documentation

## 2017-03-29 DIAGNOSIS — Z888 Allergy status to other drugs, medicaments and biological substances status: Secondary | ICD-10-CM | POA: Diagnosis not present

## 2017-03-29 DIAGNOSIS — K589 Irritable bowel syndrome without diarrhea: Secondary | ICD-10-CM | POA: Insufficient documentation

## 2017-03-29 DIAGNOSIS — Z87891 Personal history of nicotine dependence: Secondary | ICD-10-CM | POA: Insufficient documentation

## 2017-03-29 DIAGNOSIS — I10 Essential (primary) hypertension: Secondary | ICD-10-CM | POA: Diagnosis not present

## 2017-03-29 DIAGNOSIS — Z8249 Family history of ischemic heart disease and other diseases of the circulatory system: Secondary | ICD-10-CM | POA: Insufficient documentation

## 2017-03-29 DIAGNOSIS — Z8041 Family history of malignant neoplasm of ovary: Secondary | ICD-10-CM | POA: Insufficient documentation

## 2017-03-29 DIAGNOSIS — I480 Paroxysmal atrial fibrillation: Secondary | ICD-10-CM

## 2017-03-29 DIAGNOSIS — Z800 Family history of malignant neoplasm of digestive organs: Secondary | ICD-10-CM | POA: Insufficient documentation

## 2017-03-29 DIAGNOSIS — R7303 Prediabetes: Secondary | ICD-10-CM | POA: Diagnosis not present

## 2017-03-29 DIAGNOSIS — Z803 Family history of malignant neoplasm of breast: Secondary | ICD-10-CM | POA: Insufficient documentation

## 2017-03-29 DIAGNOSIS — Z8542 Personal history of malignant neoplasm of other parts of uterus: Secondary | ICD-10-CM | POA: Diagnosis not present

## 2017-03-29 DIAGNOSIS — Z9889 Other specified postprocedural states: Secondary | ICD-10-CM | POA: Insufficient documentation

## 2017-03-29 DIAGNOSIS — K219 Gastro-esophageal reflux disease without esophagitis: Secondary | ICD-10-CM | POA: Insufficient documentation

## 2017-03-29 DIAGNOSIS — Z7901 Long term (current) use of anticoagulants: Secondary | ICD-10-CM | POA: Insufficient documentation

## 2017-03-29 DIAGNOSIS — Z9049 Acquired absence of other specified parts of digestive tract: Secondary | ICD-10-CM | POA: Insufficient documentation

## 2017-03-29 MED ORDER — ACETAMINOPHEN-CODEINE #3 300-30 MG PO TABS: 1.0000 | ORAL_TABLET | Freq: Every day | ORAL | 0 refills | Status: DC | PRN

## 2017-03-29 MED FILL — ACETAMINOPHEN/COD #3 TABLET: 300-30 | 30 days supply | Qty: 30 | Fill #0

## 2017-03-29 NOTE — Telephone Encounter (Signed)
Sent!

## 2017-03-29 NOTE — Telephone Encounter (Signed)
NCCR printed- no discrepancies noted- sent for scanning.  

## 2017-03-29 NOTE — Progress Notes (Signed)
Primary Care Physician: Colon Branch, MD Referring Physician: [facility] triage EP: Dr. Pat Kocher, PA-C   Veronica Bradford is a 72 y.o. female with a h/o paroxsymal afib  followed by Dr. Caryl Comes. She usually will take EITHER flecainide or propafenone to help restore SR. She is in afib clinic for 4 month f/u from last visit with [place].  She notes that she was seen in the ER here in December with RVR around 200 mg after taking PIP Rythmol.  She did convert with cardizem IV. Sine then she has had a couple episodes of afib and has taken flecainide with conversion in 1-2 hours. She is afraid to take the rythmol again for the RVR associated with it. She always starts out with 30 mg cardizem before any antiarrythmic.She is on xarelto 20 mg daily, states no missed doses.She is currently happy with the PIP approach and does not want daily antiarrythmic.  Today, she denies symptoms of   chest pain, shortness of breath, orthopnea, PND, lower extremity edema, dizziness, presyncope, syncope, or neurologic sequela. Positive for palpitations/ fatigue.The patient is tolerating medications without difficulties and is otherwise without complaint today.   Past Medical History:  Diagnosis Date  . Back pain   . Chronic sinusitis    s/p 2 surgeries remotely, [name]  . Closed fracture of metatarsal of left foot    L foot, fifth metatarsal  . Ectopic pregnancy   . Endometrial cancer ([place]) 2004   Early diagnosis  . GERD (gastroesophageal reflux disease)   . Hyperlipemia   . Hypertension   . IBS (irritable bowel syndrome) 08/13/2014   Dx 2015, diarrhea on-off, Dr [name]   . Lumbar radiculopathy 08/2012   MRI in 08/2012  . Mild depression ([place])    Pt's son has Down's Syndrome  . Mild hyperparathyroidism ([place])   . Osteopenia   . PAF (paroxysmal atrial fibrillation) ([place])   . Prediabetes   . Sciatica of right side 08/2013   Received Depomedrol 80 mg injection  . Sleep apnea 09/09/2013   Dx with OSA in  2015, by Dr. Caryl Comes, APAP   Past Surgical History:  Procedure Laterality Date  . ABDOMINAL HYSTERECTOMY    . CHOLECYSTECTOMY  ~2006  . DILATION AND CURETTAGE OF UTERUS     after SAB  . ESOPHAGOGASTRODUODENOSCOPY  02/09/2014   Dr. [name]  . FINGER SURGERY Left    index  . NASAL SINUS SURGERY     x 2 remotely, [name]  . TONSILLECTOMY    . TOTAL ABDOMINAL HYSTERECTOMY W/ BILATERAL SALPINGOOPHORECTOMY    . TOTAL KNEE ARTHROPLASTY Right ~2008    Current Outpatient Medications  Medication Sig Dispense Refill  . acetaminophen-codeine (TYLENOL #3) 300-30 MG tablet Take 1 tablet by mouth daily as needed for moderate pain. 30 tablet 0  . Calcium Citrate-Vitamin D (CALCIUM CITRATE + PO) Take by mouth.    . cetirizine (ZYRTEC) 10 MG tablet Take 10 mg by mouth as needed for allergies.    . cholecalciferol (VITAMIN D) 1000 UNITS tablet Take 2,000 Units by mouth every other day.    . Coenzyme Q10 (CO Q 10) 10 MG CAPS Take 100 mg by mouth daily.     Marland Kitchen CRANBERRY PO Take 1 capsule by mouth as needed.     . diltiazem (CARDIZEM) 30 MG tablet Take 1 tablet every 4 hours AS NEEDED for heart rate >100 as long as blood pressure >100. 45 tablet 1  . flecainide (TAMBOCOR)  150 MG tablet Take 2 tablets (300 mg total) by mouth as needed (FOR AFIB). Pill in a pocket 30 tablet 6  . fluticasone (FLONASE) 50 MCG/ACT nasal spray Place into both nostrils as needed for allergies or rhinitis.    . furosemide (LASIX) 20 MG tablet Take 1 tablet (20 mg total) by mouth daily as needed for fluid. 30 tablet 6  . loperamide (IMODIUM A-D) 2 MG tablet Take 2 mg by mouth. 0.5 tablet bid, increase when has diarrhea    . MAGNESIUM-OXIDE PO Take 500 mg by mouth 2 (two) times daily.    . Multiple Vitamins-Minerals (MULTIVITAMIN PO) Take 1 tablet by mouth daily.    . Omega-3 Fatty Acids (FISH OIL) 1200 MG CAPS Take 1,200 mg by mouth daily.    . pantoprazole (PROTONIX) 40 MG tablet Take 40 mg by mouth daily.    . pravastatin  (PRAVACHOL) 40 MG tablet Take 1 tablet (40 mg total) by mouth at bedtime. 90 tablet 1  . valsartan (DIOVAN) 320 MG tablet Take 1 tablet (320 mg total) by mouth daily. 90 tablet 1  . XARELTO 20 MG TABS tablet TAKE 1 TABLET BY MOUTH  DAILY WITH SUPPER 90 tablet 1  . CINNAMON PO Take 0.5 tsp by mouth twice daily    . ciprofloxacin (CIPRO) 500 MG tablet Take 500 mg by mouth as needed (prior to dental work).    . propafenone (RYTHMOL) 225 MG tablet Take 2 tablets by mouth daily as needed for afib (Patient not taking: Reported on 03/29/2017) 30 tablet 6   No current facility-administered medications for this encounter.     Allergies  Allergen Reactions  . Nsaids Other (See Comments)    Pt on Xarelto  . Tolmetin Other (See Comments)    Pt on Xarelto  . Zoster Vaccine Live Other (See Comments)    Sickness per Pt.   Marland Kitchen Amoxicillin Rash    Social History   Socioeconomic History  . Marital status: Married    Spouse name: Not on file  . Number of children: 1  . Years of education: Not on file  . Highest education level: Not on file  Occupational History  . 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 Financial resource strain: Not on file  . Food insecurity:    Worry: Not on file    Inability: Not on file  . Transportation needs:    Medical: Not on file    Non-medical: Not on file  Tobacco Use  . Smoking status: Former Smoker    Last attempt to quit: 01/06/1980    Years since quitting: 37.2  . Smokeless tobacco: Never Used  Substance and Sexual Activity  . Alcohol use: Yes    Comment: rare  . Drug use: No  . Sexual activity: Not on file  Lifestyle  . Physical activity:    Days per week: Not on file    Minutes per session: Not on file  . Stress: Not on file  Relationships  . Social connections:    Talks on phone: Not on file    Gets together: Not on file    Attends religious service: Not on file    Active member of club or organization: Not on file    Attends meetings  of clubs or organizations: Not on file    Relationship status: Not on file  . Intimate partner violence:    Fear of current or ex partner: Not on file    Emotionally  abused: Not on file    Physically abused: Not on file    Forced sexual activity: Not on file  Other Topics Concern  . Not on file  Social History Narrative   Lives w/ husband, and son [name]    Family History  Problem Relation Age of Onset  . Hypertension Mother   . Colon cancer Mother   . Breast cancer Mother   . Ovarian cancer Mother   . Hypertension Father   . Diabetes Father   . Head & neck cancer Sister   . CAD Neg Hx     ROS- All systems are reviewed and negative except as per the HPI above  Physical Exam: Vitals:   03/29/17 1049  BP: 136/68  Pulse: (!) 58  Weight: 249 lb 12.8 oz (113.3 kg)  Height: 5\' 8"  (1.727 m)   Wt Readings from Last 3 Encounters:  03/29/17 249 lb 12.8 oz (113.3 kg)  12/13/16 248 lb (112.5 kg)  12/04/16 253 lb 4 oz (114.9 kg)    Labs: Lab Results  Component Value Date   NA 142 12/18/2016   K 3.8 12/18/2016   CL 108 12/18/2016   CO2 28 12/18/2016   GLUCOSE 86 12/18/2016   BUN 15 12/18/2016   CREATININE 0.62 12/18/2016   CALCIUM 9.9 12/18/2016   MG 2.1 10/18/2016   Lab Results  Component Value Date   INR 2.5 (H) 11/08/2006   Lab Results  Component Value Date   CHOL 177 03/19/2017   HDL 58.00 03/19/2017   LDLCALC 98 03/19/2017   TRIG 104.0 03/19/2017     GEN- The patient is well appearing, alert and oriented x 3 today.   Head- normocephalic, atraumatic Eyes-  Sclera clear, conjunctiva pink Ears- hearing intact Oropharynx- clear Neck- supple, no JVP Lymph- no cervical lymphadenopathy Lungs- Clear to ausculation bilaterally, normal work of breathing Heart- irregular rate and rhythm, no murmurs, rubs or gallops, PMI not laterally displaced GI- soft, NT, ND, + BS Extremities- no clubbing, cyanosis, or edema MS- no significant deformity or atrophy Skin- no  rash or lesion Psych- euthymic mood, full affect Neuro- strength and sensation are intact  EKG- Sinus brady at 58 bpm, PR int 216 ms, qrs int 154 bpm, qtc 433 ms Epic records reviewed    Assessment and Plan: 1. Paroxysmal afib  Still happy with PIP approach with flecainde to control afib burden Prefers not to take propafenone as she had HR around 200 bpm with last use Continue 30 mg Cardizem to use every 4 hours for breakthrough afib to take first before flecainide Continue xarelto 20 mg daily for chadsvasc score of at least 3  F/u with Dr. Caryl Comes in June   [place]. Rashon Westrup, [facility] [address] [place], [place] [address] [phone]

## 2017-04-11 ENCOUNTER — Other Ambulatory Visit (INDEPENDENT_AMBULATORY_CARE_PROVIDER_SITE_OTHER): Payer: Medicare Other

## 2017-04-11 DIAGNOSIS — E213 Hyperparathyroidism, unspecified: Secondary | ICD-10-CM

## 2017-04-14 LAB — VITAMIN D 1,25 DIHYDROXY
Vitamin D 1, 25 (OH)2 Total: 44 pg/mL (ref 18–72)
Vitamin D2 1, 25 (OH)2: <8 pg/mL
Vitamin D3 1, 25 (OH)2: 44 pg/mL

## 2017-04-14 LAB — PTH, INTACT AND CALCIUM
CALCIUM: 10.3 mg/dL (ref 8.6–10.4)
PTH: 125 pg/mL — AB (ref 14–64)

## 2017-04-17 DIAGNOSIS — E041 Nontoxic single thyroid nodule: Secondary | ICD-10-CM | POA: Diagnosis not present

## 2017-04-17 DIAGNOSIS — E349 Endocrine disorder, unspecified: Secondary | ICD-10-CM | POA: Diagnosis not present

## 2017-04-30 NOTE — Progress Notes (Addendum)
Subjective:   Veronica Bradford is a 72 y.o. female who presents for Medicare Annual/Subsequent preventive examination.  Review of Systems: No ROS.  Medicare Wellness Visit. Additional risk factors are reflected in the social history.  Cardiac Risk Factors include: advanced age (>32men, >34 women);hypertension;obesity (BMI >30kg/m2);sedentary lifestyle Sleep patterns: Uses Benadryl only as needed. Sleeps 4-6 hrs. Naps as needed.  Home Safety/Smoke Alarms: Feels safe in home. Smoke alarms in place.  Living environment; residence and Firearm Safety: Lives with husband on 1 floor of 2 story home.  Seat Belt Safety/Bike Helmet: Wears seat belt.   Female:   Pap- hysterectomy. Last pap 04/29/15      Mammo- ordered      Dexa scan-  ordered      CCS- utd   Objective:    Vitals: BP 134/62 (BP Location: Left Wrist, Patient Position: Sitting, Cuff Size: Normal)   Pulse (!) 53   Ht 5\' 8"  (1.727 m)   Wt 258 lb (117 kg)   SpO2 96%   BMI 39.23 kg/m   Body mass index is 39.23 kg/m.  Advanced Directives 05/03/2017 12/13/2016 05/02/2016 04/06/2015  Does Patient Have a Medical Advance Directive? Yes Yes Yes Yes  Type of Paramedic of [place];Living will [place];Living will [place];Living will [place];Living will  Does patient want to make changes to medical advance directive? No - Patient declined - - No - Patient declined  Copy of [place] in Chart? No - copy requested - No - copy requested -    Tobacco Social History   Tobacco Use  Smoking Status Former Smoker  . Last attempt to quit: 01/06/1980  . Years since quitting: 37.3  Smokeless Tobacco Never Used     Counseling given: Not Answered   Clinical Intake:     Pain : No/denies pain                 Past Medical History:  Diagnosis Date  . Back pain   . Chronic sinusitis    s/p 2 surgeries remotely, [name]  . Closed  fracture of metatarsal of left foot    L foot, fifth metatarsal  . Ectopic pregnancy   . Endometrial cancer ([place]) 2004   Early diagnosis  . GERD (gastroesophageal reflux disease)   . Hyperlipemia   . Hypertension   . IBS (irritable bowel syndrome) 08/13/2014   Dx 2015, diarrhea on-off, Dr [name]   . Lumbar radiculopathy 08/2012   MRI in 08/2012  . Mild depression ([place])    Pt's son has Down's Syndrome  . Mild hyperparathyroidism ([place])   . Osteopenia   . PAF (paroxysmal atrial fibrillation) ([place])   . Prediabetes   . Sciatica of right side 08/2013   Received Depomedrol 80 mg injection  . Sleep apnea 09/09/2013   Dx with OSA in 2015, by Dr. Caryl Comes, APAP   Past Surgical History:  Procedure Laterality Date  . ABDOMINAL HYSTERECTOMY    . CHOLECYSTECTOMY  ~2006  . DILATION AND CURETTAGE OF UTERUS     after SAB  . ESOPHAGOGASTRODUODENOSCOPY  02/09/2014   Dr. [name]  . FINGER SURGERY Left    index  . NASAL SINUS SURGERY     x 2 remotely, [name]  . TONSILLECTOMY    . TOTAL ABDOMINAL HYSTERECTOMY W/ BILATERAL SALPINGOOPHORECTOMY    . TOTAL KNEE ARTHROPLASTY Right ~2008   Family History  Problem Relation Age of  Onset  . Hypertension Mother   . Colon cancer Mother   . Breast cancer Mother   . Ovarian cancer Mother   . Hypertension Father   . Diabetes Father   . Head & neck cancer Sister   . CAD Neg Hx    Social History   Socioeconomic History  . Marital status: Married    Spouse name: Not on file  . Number of children: 1  . Years of education: Not on file  . Highest education level: Not on file  Occupational History  . 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 Financial resource strain: Not on file  . Food insecurity:    Worry: Not on file    Inability: Not on file  . Transportation needs:    Medical: Not on file    Non-medical: Not on file  Tobacco Use  . Smoking status: Former Smoker    Last attempt to quit: 01/06/1980    Years since quitting: 37.3  .  Smokeless tobacco: Never Used  Substance and Sexual Activity  . Alcohol use: Yes    Comment: rare  . Drug use: No  . Sexual activity: Not on file  Lifestyle  . Physical activity:    Days per week: Not on file    Minutes per session: Not on file  . Stress: Not on file  Relationships  . Social connections:    Talks on phone: Not on file    Gets together: Not on file    Attends religious service: Not on file    Active member of club or organization: Not on file    Attends meetings of clubs or organizations: Not on file    Relationship status: Not on file  Other Topics Concern  . Not on file  Social History Narrative   Lives w/ husband, and son Veronica Bradford    Outpatient Encounter Medications as of 05/03/2017  Medication Sig  . acetaminophen-codeine (TYLENOL #3) 300-30 MG tablet Take 1 tablet by mouth daily as needed for moderate pain.  . cetirizine (ZYRTEC) 10 MG tablet Take 10 mg by mouth as needed for allergies.  . cholecalciferol (VITAMIN D) 1000 UNITS tablet Take 2,000 Units by mouth every other day.  . Coenzyme Q10 (CO Q 10) 10 MG CAPS Take 100 mg by mouth daily.   Marland Kitchen CRANBERRY PO Take 1 capsule by mouth as needed.   . diltiazem (CARDIZEM) 30 MG tablet Take 1 tablet every 4 hours AS NEEDED for heart rate >100 as long as blood pressure >100.  . flecainide (TAMBOCOR) 150 MG tablet Take 2 tablets (300 mg total) by mouth as needed (FOR AFIB). Pill in a pocket  . fluticasone (FLONASE) 50 MCG/ACT nasal spray Place into both nostrils as needed for allergies or rhinitis.  . furosemide (LASIX) 20 MG tablet Take 1 tablet (20 mg total) by mouth daily as needed for fluid.  Marland Kitchen loperamide (IMODIUM A-D) 2 MG tablet Take 2 mg by mouth. 0.5 tablet bid, increase when has diarrhea  . MAGNESIUM-OXIDE PO Take 500 mg by mouth 2 (two) times daily.  . Multiple Vitamins-Minerals (MULTIVITAMIN PO) Take 1 tablet by mouth daily.  . Omega-3 Fatty Acids (FISH OIL) 1200 MG CAPS Take 1,200 mg by mouth daily.  .  pantoprazole (PROTONIX) 40 MG tablet Take 40 mg by mouth daily.  . pravastatin (PRAVACHOL) 40 MG tablet Take 1 tablet (40 mg total) by mouth at bedtime.  . propafenone (RYTHMOL) 225 MG tablet Take 2 tablets  by mouth daily as needed for afib  . valsartan (DIOVAN) 320 MG tablet Take 1 tablet (320 mg total) by mouth daily.  Alveda Reasons 20 MG TABS tablet TAKE 1 TABLET BY MOUTH  DAILY WITH SUPPER  . Calcium Citrate-Vitamin D (CALCIUM CITRATE + PO) Take by mouth.  Marland Kitchen CINNAMON PO Take 0.5 tsp by mouth twice daily  . ciprofloxacin (CIPRO) 500 MG tablet Take 500 mg by mouth as needed (prior to dental work).   No facility-administered encounter medications on file as of 05/03/2017.     Activities of Daily Living In your present state of health, do you have any difficulty performing the following activities: 05/03/2017  Hearing? N  Vision? N  Comment wears glasses  Difficulty concentrating or making decisions? N  Walking or climbing stairs? N  Dressing or bathing? N  Doing errands, shopping? N  Preparing Food and eating ? N  Using the Toilet? N  In the past six months, have you accidently leaked urine? N  Do you have problems with loss of bowel control? N  Managing your Medications? N  Managing your Finances? N  Housekeeping or managing your Housekeeping? N  Some recent data might be hidden    Patient Care Team: Colon Branch, MD as PCP - General (Internal Medicine) Marius Ditch, MD as Consulting Physician (Internal Medicine) [name], MD as Consulting Physician (Cardiology) Wylene Simmer, MD as Consulting Physician (Orthopedic Surgery) Armandina Gemma, MD as Consulting Physician (General Surgery)   Assessment:   This is a routine wellness examination for [place]. Physical assessment deferred to PCP.   Exercise Activities and Dietary recommendations Current Exercise Habits: The patient does not participate in regular exercise at present, Exercise limited by: None identified Diet (meal  preparation, eat out, water intake, caffeinated beverages, dairy products, fruits and vegetables): well balanced, on average, 3 meals per day   Goals    . Weight (lb) < 225 lb (102.1 kg) (pt-stated)     With diet and exercise        Fall Risk Fall Risk  05/03/2017 05/02/2016 10/19/2015 04/14/2015 08/13/2014  Falls in the past year? No No No No No     Depression Screen PHQ 2/9 Scores 05/03/2017 05/02/2016 10/19/2015 04/14/2015  PHQ - 2 Score 0 0 0 0    Cognitive Function Ad8 score reviewed for issues:  Issues making decisions:no  Less interest in hobbies / activities:no  Repeats questions, stories (family complaining):no  Trouble using ordinary gadgets (microwave, computer, phone):no  Forgets the month or year: no  Mismanaging finances: no  Remembering appts:no  Daily problems with thinking and/or memory:no Ad8 score is=0         Immunization History  Administered Date(s) Administered  . Influenza, High Dose Seasonal PF 10/01/2014, 10/19/2015, 11/10/2016  . Influenza-Unspecified 10/19/2010, 09/09/2013  . Pneumococcal Conjugate-13 03/07/2013  . Pneumococcal Polysaccharide-23 09/09/2013  . Tdap 07/26/2011  . Zoster 11/12/2012    Screening Tests Health Maintenance  Topic Date Due  . MAMMOGRAM  05/02/2017  . INFLUENZA VACCINE  08/02/2017  . TETANUS/TDAP  07/25/2021  . COLONOSCOPY  11/03/2021  . DEXA SCAN  Completed  . Hepatitis C Screening  Completed  . PNA vac Low Risk Adult  Completed        Plan:   Follow up with [name] as scheduled 05/23/17  Eat heart healthy diet (full of fruits, vegetables, whole grains, lean protein, water--limit salt, fat, and sugar intake) and increase physical activity as tolerated.  Continue doing  brain stimulating activities (puzzles, reading, adult coloring books, staying active) to keep memory sharp.   I have personally reviewed and noted the following in the patient's chart:   . Medical and social history . Use of alcohol,  tobacco or illicit drugs  . Current medications and supplements . Functional ability and status . Nutritional status . Physical activity . Advanced directives . List of other physicians . Hospitalizations, surgeries, and ER visits in previous 12 months . Vitals . Screenings to include cognitive, depression, and falls . Referrals and appointments  In addition, I have reviewed and discussed with patient certain preventive protocols, quality metrics, and best practice recommendations. A written personalized care plan for preventive services as well as general preventive health recommendations were provided to patient.     Veronica Bradford, [place]  05/03/2017  Kathlene November, MD

## 2017-05-03 ENCOUNTER — Ambulatory Visit (HOSPITAL_BASED_OUTPATIENT_CLINIC_OR_DEPARTMENT_OTHER)
Admission: RE | Admit: 2017-05-03 | Discharge: 2017-05-03 | Disposition: A | Discharge status: Home / Self Care | Payer: Medicare Other | Source: Ambulatory Visit | Attending: Internal Medicine | Admitting: Internal Medicine

## 2017-05-03 ENCOUNTER — Ambulatory Visit (INDEPENDENT_AMBULATORY_CARE_PROVIDER_SITE_OTHER): Payer: Medicare Other | Admitting: *Deleted

## 2017-05-03 ENCOUNTER — Encounter: Payer: Self-pay | Admitting: *Deleted

## 2017-05-03 VITALS — BP 134/62 | HR 53 | Ht 68.0 in | Wt 258.0 lb

## 2017-05-03 DIAGNOSIS — Z780 Asymptomatic menopausal state: Secondary | ICD-10-CM | POA: Insufficient documentation

## 2017-05-03 DIAGNOSIS — Z0000 Encounter for general adult medical examination without abnormal findings: Secondary | ICD-10-CM

## 2017-05-03 DIAGNOSIS — Z1231 Encounter for screening mammogram for malignant neoplasm of breast: Secondary | ICD-10-CM | POA: Insufficient documentation

## 2017-05-03 DIAGNOSIS — M85852 Other specified disorders of bone density and structure, left thigh: Secondary | ICD-10-CM | POA: Diagnosis not present

## 2017-05-03 DIAGNOSIS — Z1239 Encounter for other screening for malignant neoplasm of breast: Secondary | ICD-10-CM

## 2017-05-03 NOTE — Patient Instructions (Signed)
Follow up with [name] as scheduled 05/23/17  Eat heart healthy diet (full of fruits, vegetables, whole grains, lean protein, water--limit salt, fat, and sugar intake) and increase physical activity as tolerated.  Continue doing brain stimulating activities (puzzles, reading, adult coloring books, staying active) to keep memory sharp.    Veronica Bradford , Thank you for taking time to come for your Medicare Wellness Visit. I appreciate your ongoing commitment to your health goals. Please review the following plan we discussed and let me know if I can assist you in the future.   These are the goals we discussed: Goals    . Weight (lb) < 225 lb (102.1 kg) (pt-stated)     With diet and exercise        This is a list of the screening recommended for you and due dates:  Health Maintenance  Topic Date Due  . Mammogram  05/02/2017  . Flu Shot  08/02/2017  . Tetanus Vaccine  07/25/2021  . Colon Cancer Screening  11/03/2021  . DEXA scan (bone density measurement)  Completed  .  Hepatitis C: One time screening is recommended by Center for Disease Control  (CDC) for  adults born from [birth year] through 1963.   Completed  . Pneumonia vaccines  Completed    Health Maintenance for Postmenopausal Women Menopause is a normal process in which your reproductive ability comes to an end. This process happens gradually over a span of months to years, usually between the ages of 34 and 41. Menopause is complete when you have missed 12 consecutive menstrual periods. It is important to talk with your health care provider about some of the most common conditions that affect postmenopausal women, such as heart disease, cancer, and bone loss (osteoporosis). Adopting a healthy lifestyle and getting preventive care can help to promote your health and wellness. Those actions can also lower your chances of developing some of these common conditions. What should I know about menopause? During menopause, you may experience a  number of symptoms, such as:  Moderate-to-severe hot flashes.  Night sweats.  Decrease in sex drive.  Mood swings.  Headaches.  Tiredness.  Irritability.  Memory problems.  Insomnia.  Choosing to treat or not to treat menopausal changes is an individual decision that you make with your health care provider. What should I know about hormone replacement therapy and supplements? Hormone therapy products are effective for treating symptoms that are associated with menopause, such as hot flashes and night sweats. Hormone replacement carries certain risks, especially as you become older. If you are thinking about using estrogen or estrogen with progestin treatments, discuss the benefits and risks with your health care provider. What should I know about heart disease and stroke? Heart disease, heart attack, and stroke become more likely as you age. This may be due, in part, to the hormonal changes that your body experiences during menopause. These can affect how your body processes dietary fats, triglycerides, and cholesterol. Heart attack and stroke are both medical emergencies. There are many things that you can do to help prevent heart disease and stroke:  Have your blood pressure checked at least every 1-2 years. High blood pressure causes heart disease and increases the risk of stroke.  If you are 28-39 years old, ask your health care provider if you should take aspirin to prevent a heart attack or a stroke.  Do not use any tobacco products, including cigarettes, chewing tobacco, or electronic cigarettes. If you need help quitting, ask your health  care provider.  It is important to eat a healthy diet and maintain a healthy weight. ? Be sure to include plenty of vegetables, fruits, low-fat dairy products, and lean protein. ? Avoid eating foods that are high in solid fats, added sugars, or salt (sodium).  Get regular exercise. This is one of the most important things that you can do  for your health. ? Try to exercise for at least 150 minutes each week. The type of exercise that you do should increase your heart rate and make you sweat. This is known as moderate-intensity exercise. ? Try to do strengthening exercises at least twice each week. Do these in addition to the moderate-intensity exercise.  Know your numbers.Ask your health care provider to check your cholesterol and your blood glucose. Continue to have your blood tested as directed by your health care provider.  What should I know about cancer screening? There are several types of cancer. Take the following steps to reduce your risk and to catch any cancer development as early as possible. Breast Cancer  Practice breast self-awareness. ? This means understanding how your breasts normally appear and feel. ? It also means doing regular breast self-exams. Let your health care provider know about any changes, no matter how small.  If you are 70 or older, have a clinician do a breast exam (clinical breast exam or CBE) every year. Depending on your age, family history, and medical history, it may be recommended that you also have a yearly breast X-ray (mammogram).  If you have a family history of breast cancer, talk with your health care provider about genetic screening.  If you are at high risk for breast cancer, talk with your health care provider about having an MRI and a mammogram every year.  Breast cancer (BRCA) gene test is recommended for women who have family members with BRCA-related cancers. Results of the assessment will determine the need for genetic counseling and BRCA1 and for BRCA2 testing. BRCA-related cancers include these types: ? Breast. This occurs in males or females. ? Ovarian. ? Tubal. This may also be called fallopian tube cancer. ? Cancer of the abdominal or pelvic lining (peritoneal cancer). ? Prostate. ? Pancreatic.  Cervical, Uterine, and Ovarian Cancer Your health care provider may  recommend that you be screened regularly for cancer of the pelvic organs. These include your ovaries, uterus, and vagina. This screening involves a pelvic exam, which includes checking for microscopic changes to the surface of your cervix (Pap test).  For women ages 21-65, health care providers may recommend a pelvic exam and a Pap test every three years. For women ages 44-65, they may recommend the Pap test and pelvic exam, combined with testing for human papilloma virus (HPV), every five years. Some types of HPV increase your risk of cervical cancer. Testing for HPV may also be done on women of any age who have unclear Pap test results.  Other health care providers may not recommend any screening for nonpregnant women who are considered low risk for pelvic cancer and have no symptoms. Ask your health care provider if a screening pelvic exam is right for you.  If you have had past treatment for cervical cancer or a condition that could lead to cancer, you need Pap tests and screening for cancer for at least 20 years after your treatment. If Pap tests have been discontinued for you, your risk factors (such as having a new sexual partner) need to be reassessed to determine if you should  start having screenings again. Some women have medical problems that increase the chance of getting cervical cancer. In these cases, your health care provider may recommend that you have screening and Pap tests more often.  If you have a family history of uterine cancer or ovarian cancer, talk with your health care provider about genetic screening.  If you have vaginal bleeding after reaching menopause, tell your health care provider.  There are currently no reliable tests available to screen for ovarian cancer.  Lung Cancer Lung cancer screening is recommended for adults 21-18 years old who are at high risk for lung cancer because of a history of smoking. A yearly low-dose CT scan of the lungs is recommended if  you:  Currently smoke.  Have a history of at least 30 pack-years of smoking and you currently smoke or have quit within the past 15 years. A pack-year is smoking an average of one pack of cigarettes per day for one year.  Yearly screening should:  Continue until it has been 15 years since you quit.  Stop if you develop a health problem that would prevent you from having lung cancer treatment.  Colorectal Cancer  This type of cancer can be detected and can often be prevented.  Routine colorectal cancer screening usually begins at age 4 and continues through age 53.  If you have risk factors for colon cancer, your health care provider may recommend that you be screened at an earlier age.  If you have a family history of colorectal cancer, talk with your health care provider about genetic screening.  Your health care provider may also recommend using home test kits to check for hidden blood in your stool.  A small camera at the end of a tube can be used to examine your colon directly (sigmoidoscopy or colonoscopy). This is done to check for the earliest forms of colorectal cancer.  Direct examination of the colon should be repeated every 5-10 years until age 15. However, if early forms of precancerous polyps or small growths are found or if you have a family history or genetic risk for colorectal cancer, you may need to be screened more often.  Skin Cancer  Check your skin from head to toe regularly.  Monitor any moles. Be sure to tell your health care provider: ? About any new moles or changes in moles, especially if there is a change in a mole's shape or color. ? If you have a mole that is larger than the size of a pencil eraser.  If any of your family members has a history of skin cancer, especially at a young age, talk with your health care provider about genetic screening.  Always use sunscreen. Apply sunscreen liberally and repeatedly throughout the day.  Whenever you are  outside, protect yourself by wearing long sleeves, pants, a wide-brimmed hat, and sunglasses.  What should I know about osteoporosis? Osteoporosis is a condition in which bone destruction happens more quickly than new bone creation. After menopause, you may be at an increased risk for osteoporosis. To help prevent osteoporosis or the bone fractures that can happen because of osteoporosis, the following is recommended:  If you are 19-55 years old, get at least 1,000 mg of calcium and at least 600 mg of vitamin D per day.  If you are older than age 54 but younger than age 78, get at least 1,200 mg of calcium and at least 600 mg of vitamin D per day.  If you are older  than age 1, get at least 1,200 mg of calcium and at least 800 mg of vitamin D per day.  Smoking and excessive alcohol intake increase the risk of osteoporosis. Eat foods that are rich in calcium and vitamin D, and do weight-bearing exercises several times each week as directed by your health care provider. What should I know about how menopause affects my mental health? Depression may occur at any age, but it is more common as you become older. Common symptoms of depression include:  Low or sad mood.  Changes in sleep patterns.  Changes in appetite or eating patterns.  Feeling an overall lack of motivation or enjoyment of activities that you previously enjoyed.  Frequent crying spells.  Talk with your health care provider if you think that you are experiencing depression. What should I know about immunizations? It is important that you get and maintain your immunizations. These include:  Tetanus, diphtheria, and pertussis (Tdap) booster vaccine.  Influenza every year before the flu season begins.  Pneumonia vaccine.  Shingles vaccine.  Your health care provider may also recommend other immunizations. This information is not intended to replace advice given to you by your health care provider. Make sure you discuss  any questions you have with your health care provider. Document Released: 02/10/2005 Document Revised: 07/09/2015 Document Reviewed: 09/22/2014 Elsevier Interactive Patient Education  2018 [employer].

## 2017-05-15 ENCOUNTER — Encounter: Payer: Self-pay | Admitting: Internal Medicine

## 2017-05-16 ENCOUNTER — Other Ambulatory Visit: Payer: Self-pay | Admitting: Internal Medicine

## 2017-05-16 NOTE — Telephone Encounter (Signed)
Xarelto 20mg  refill request received; pt is 72 yrs old, wt-114.9kg, Crea-0.70 on 01/03/17, last seen by Roderic Palau on 03/29/17, CrCl-137.21ml/min; will send in refill to requested pharmacy.

## 2017-05-23 ENCOUNTER — Encounter: Payer: Self-pay | Admitting: Internal Medicine

## 2017-05-23 ENCOUNTER — Ambulatory Visit (INDEPENDENT_AMBULATORY_CARE_PROVIDER_SITE_OTHER): Payer: Medicare Other | Admitting: Internal Medicine

## 2017-05-23 VITALS — BP 128/78 | HR 53 | Temp 98.0°F | Resp 14 | Ht 68.0 in | Wt 256.2 lb

## 2017-05-23 DIAGNOSIS — I10 Essential (primary) hypertension: Secondary | ICD-10-CM

## 2017-05-23 DIAGNOSIS — M545 Low back pain, unspecified: Secondary | ICD-10-CM

## 2017-05-23 DIAGNOSIS — R945 Abnormal results of liver function studies: Secondary | ICD-10-CM

## 2017-05-23 DIAGNOSIS — G8929 Other chronic pain: Secondary | ICD-10-CM

## 2017-05-23 DIAGNOSIS — E785 Hyperlipidemia, unspecified: Secondary | ICD-10-CM

## 2017-05-23 DIAGNOSIS — Z0000 Encounter for general adult medical examination without abnormal findings: Secondary | ICD-10-CM

## 2017-05-23 DIAGNOSIS — M8580 Other specified disorders of bone density and structure, unspecified site: Secondary | ICD-10-CM

## 2017-05-23 DIAGNOSIS — R7989 Other specified abnormal findings of blood chemistry: Secondary | ICD-10-CM

## 2017-05-23 DIAGNOSIS — L989 Disorder of the skin and subcutaneous tissue, unspecified: Secondary | ICD-10-CM | POA: Diagnosis not present

## 2017-05-23 LAB — CBC WITH DIFFERENTIAL/PLATELET
BASOS ABS: 0 10*3/uL (ref 0.0–0.1)
Basophils Relative: 1 % (ref 0.0–3.0)
EOS PCT: 4.3 % (ref 0.0–5.0)
Eosinophils Absolute: 0.2 10*3/uL (ref 0.0–0.7)
HCT: 40.1 % (ref 36.0–46.0)
HEMOGLOBIN: 13.3 g/dL (ref 12.0–15.0)
Lymphocytes Relative: 34 % (ref 12.0–46.0)
Lymphs Abs: 1.6 10*3/uL (ref 0.7–4.0)
MCHC: 33.1 g/dL (ref 30.0–36.0)
MCV: 88.5 fl (ref 78.0–100.0)
MONOS PCT: 7.7 % (ref 3.0–12.0)
Monocytes Absolute: 0.4 10*3/uL (ref 0.1–1.0)
Neutro Abs: 2.4 10*3/uL (ref 1.4–7.7)
Neutrophils Relative %: 53 % (ref 43.0–77.0)
Platelets: 191 10*3/uL (ref 150.0–400.0)
RBC: 4.53 Mil/uL (ref 3.87–5.11)
RDW: 14.7 % (ref 11.5–15.5)
WBC: 4.6 10*3/uL (ref 4.0–10.5)

## 2017-05-23 LAB — COMPREHENSIVE METABOLIC PANEL
ALBUMIN: 4 g/dL (ref 3.5–5.2)
ALK PHOS: 83 U/L (ref 39–117)
ALT: 36 U/L — ABNORMAL HIGH (ref 0–35)
AST: 38 U/L — ABNORMAL HIGH (ref 0–37)
BILIRUBIN TOTAL: 0.8 mg/dL (ref 0.2–1.2)
BUN: 18 mg/dL (ref 6–23)
CO2: 30 mEq/L (ref 19–32)
Calcium: 10.3 mg/dL (ref 8.4–10.5)
Chloride: 107 mEq/L (ref 96–112)
Creatinine, Ser: 0.65 mg/dL (ref 0.40–1.20)
GFR: 95.94 mL/min (ref >60.00)
GLUCOSE: 93 mg/dL (ref 70–99)
Potassium: 4.1 mEq/L (ref 3.5–5.1)
SODIUM: 143 meq/L (ref 135–145)
TOTAL PROTEIN: 6.4 g/dL (ref 6.0–8.3)

## 2017-05-23 LAB — TSH: TSH: 1.06 u[IU]/mL (ref 0.35–4.50)

## 2017-05-23 MED ORDER — ACETAMINOPHEN-CODEINE #3 300-30 MG PO TABS: 1.0000 | ORAL_TABLET | Freq: Every day | ORAL | 0 refills | Status: DC | PRN

## 2017-05-23 MED FILL — ACETAMINOPHEN/COD #3 TABLET: 300-30 | 30 days supply | Qty: 30 | Fill #0

## 2017-05-23 NOTE — Patient Instructions (Signed)
GO TO THE LAB : Get the blood work     GO TO THE FRONT DESK Schedule your next appointment for a  routine checkup in 6 months  

## 2017-05-23 NOTE — Progress Notes (Signed)
Pre visit review using our clinic review tool, if applicable. No additional management support is needed unless otherwise documented below in the visit note. 

## 2017-05-23 NOTE — Assessment & Plan Note (Signed)
DM A1c stable over time.  No change Hyperlipidemia: Well-controlled currently on   Pravachol HTN: Currently well controlled on Diovan 320 mg, takes half B.I.D..  He uses Lasix as needed for edema, Cardizem as needed for PVCs. GI: Had a number of GI symptoms, had a EGD and colonoscopy 12/2016, doing better. Chronic back pain: Sxs still there but stable over time, we discussed possibly referral to orthopedic doctor but she declined, at this point she will not accept any procedures.  She takes Tylenol #3 on average 3 times a week. Osteopenia: Last DEXA.  05/03/2017, T score increased from -0.7 to -1.8 context of primary hyperparathyroidism.  Discussed case with surgery, they are not opposed to start medication.  After talking with the patient we agreed to reassess her T score in 1 year and decide then whether or not she likes to start medication then Aanxiety: +  stress related to her own health issues.  Counseled. Request a dermatology referral for a general screening.  Multiple skin lesions. RTC 6 months

## 2017-05-23 NOTE — Progress Notes (Signed)
Subjective:    Patient ID: Veronica Bradford, female    DOB: 03-Jul-1945, 72 y.o.   MRN: [id number]  DOS:  05/23/2017 Type of visit - description : CPX Interval history: Chronic medical issues reviewed.      Review of Systems Continue with chronic back pain, often times at night, denies fever, chills.  Takes Tylenol # 3 on average 3 times a week, overall pain has been stable over the last few years. Admit to a stress, mostly related to her own health.  Other than above, a 14 point review of systems is negative      Past Medical History:  Diagnosis Date  . Back pain   . Chronic sinusitis    s/p 2 surgeries remotely, Dr Veronica Bradford  . Closed fracture of metatarsal of left foot    L foot, fifth metatarsal  . Ectopic pregnancy   . Endometrial cancer ([place]) 2004   Early diagnosis  . GERD (gastroesophageal reflux disease)   . Hyperlipemia   . Hypertension   . IBS (irritable bowel syndrome) 08/13/2014   Dx 2015, diarrhea on-off, Dr Veronica Bradford   . Lumbar radiculopathy 08/2012   MRI in 08/2012  . Mild depression ([place])    Pt's son has Down's Syndrome  . 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 Osteopenia   . PAF (paroxysmal atrial fibrillation) ([place])   . Prediabetes   . Sciatica of right side 08/2013   Received Depomedrol 80 mg injection  . Sleep apnea 09/09/2013   Dx with OSA in 2015, by Dr. Caryl Comes, APAP    Past Surgical History:  Procedure Laterality Date  . ABDOMINAL HYSTERECTOMY    . CHOLECYSTECTOMY  ~2006  . DILATION AND CURETTAGE OF UTERUS     after SAB  . ESOPHAGOGASTRODUODENOSCOPY  02/09/2014   Dr. Cristina Bradford  . FINGER SURGERY Left    index  . NASAL SINUS SURGERY     x 2 remotely, Dr Veronica Bradford  . TONSILLECTOMY    . TOTAL ABDOMINAL HYSTERECTOMY W/ BILATERAL SALPINGOOPHORECTOMY    . TOTAL KNEE ARTHROPLASTY Right ~2008    Social History   Socioeconomic History  . Marital status: Married    Spouse name: Not on file  . Number of children: 1  . Years of education: Not on file  . Highest  education level: Not on file  Occupational History  . 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 Financial resource strain: Not on file  . Food insecurity:    Worry: Not on file    Inability: Not on file  . Transportation needs:    Medical: Not on file    Non-medical: Not on file  Tobacco Use  . Smoking status: Former Smoker    Last attempt to quit: 01/06/1980    Years since quitting: 37.4  . Smokeless tobacco: Never Used  . Tobacco comment: smoked from 1970 to 1982, less than 1 ppd  Substance and Sexual Activity  . Alcohol use: Yes    Comment: rare  . Drug use: No  . Sexual activity: Not on file  Lifestyle  . Physical activity:    Days per week: Not on file    Minutes per session: Not on file  . Stress: Not on file  Relationships  . Social connections:    Talks on phone: Not on file    Gets together: Not on file    Attends religious service: Not on file    Active member of club or organization: Not on  file    Attends meetings of clubs or organizations: Not on file    Relationship status: Not on file  . Intimate partner violence:    Fear of current or ex partner: Not on file    Emotionally abused: Not on file    Physically abused: Not on file    Forced sexual activity: Not on file  Other Topics Concern  . Not on file  Social History Narrative   Lives w/ husband, and son Veronica Bradford     Family History  Problem Relation Age of Onset  . Hypertension Mother   . Colon cancer Mother   . Breast cancer Mother   . Ovarian cancer Mother   . Hypertension Father   . Diabetes Father   . Head & neck cancer Sister   . CAD Neg Hx      Allergies as of 05/23/2017      Reactions   Nsaids Other (See Comments)   Pt on Xarelto   Tolmetin Other (See Comments)   Pt on Xarelto   Zoster Vaccine Live Other (See Comments)   Sickness per Pt.    Amoxicillin Rash      Medication List        Accurate as of 05/23/17  6:36 PM. Always use your most recent med list.            acetaminophen-codeine 300-30 MG tablet Commonly known as:  TYLENOL #3 Take 1 tablet by mouth daily as needed for moderate pain.   cetirizine 10 MG tablet Commonly known as:  ZYRTEC Take 10 mg by mouth as needed for allergies.   cholecalciferol 1000 units tablet Commonly known as:  VITAMIN D Take 2,000 Units by mouth every other day.   CINNAMON PO Take 0.5 tsp by mouth twice daily   ciprofloxacin 500 MG tablet Commonly known as:  CIPRO Take 500 mg by mouth as needed (prior to dental work).   Co Q 10 10 MG Caps Take 100 mg by mouth daily.   CRANBERRY PO Take 1 capsule by mouth as needed.   diltiazem 30 MG tablet Commonly known as:  CARDIZEM Take 1 tablet every 4 hours AS NEEDED for heart rate >100 as long as blood pressure >100.   Fish Oil 1200 MG Caps Take 1,200 mg by mouth daily.   flecainide 150 MG tablet Commonly known as:  TAMBOCOR Take 2 tablets (300 mg total) by mouth as needed (FOR AFIB). Pill in a pocket   fluticasone 50 MCG/ACT nasal spray Commonly known as:  FLONASE Place into both nostrils as needed for allergies or rhinitis.   furosemide 20 MG tablet Commonly known as:  LASIX Take 1 tablet (20 mg total) by mouth daily as needed for fluid.   IMODIUM A-D 2 MG tablet Generic drug:  loperamide Take 2 mg by mouth. 0.5 tablet bid, increase when has diarrhea   MAGNESIUM-OXIDE PO Take 500 mg by mouth 2 (two) times daily.   MULTIVITAMIN PO Take 1 tablet by mouth daily.   pantoprazole 40 MG tablet Commonly known as:  PROTONIX Take 40 mg by mouth daily.   pravastatin 40 MG tablet Commonly known as:  PRAVACHOL Take 1 tablet (40 mg total) by mouth at bedtime.   propafenone 225 MG tablet Commonly known as:  RYTHMOL Take 2 tablets by mouth daily as needed for afib   valsartan 320 MG tablet Commonly known as:  DIOVAN Take 1 tablet (320 mg total) by mouth daily.   XARELTO 20 MG  Tabs tablet Generic drug:  rivaroxaban TAKE 1 TABLET BY MOUTH  DAILY  WITH SUPPER          Objective:   Physical Exam BP 128/78 (BP Location: Left Arm, Patient Position: Sitting, Cuff Size: Normal)   Pulse (!) 53   Temp 98 F (36.7 C) (Oral)   Resp 14   Ht 5\' 8"  (1.727 m)   Wt 256 lb 4 oz (116.2 kg)   SpO2 98%   BMI 38.96 kg/m  General:   Well developed, obese appearing. NAD.  Neck: No  thyromegaly  HEENT:  Normocephalic . Face symmetric, atraumatic Lungs:  CTA B Normal respiratory effort, no intercostal retractions, no accessory muscle use. Heart: RRR,  no murmur.  No pretibial edema today  Abdomen:  Not distended, soft, non-tender. No rebound or rigidity.   Skin: Noticeable varicose veins at the lower extremities, no phlebitis. Neurologic:  alert & oriented X3.  Speech normal, gait appropriate for age and unassisted Strength symmetric and appropriate for age.  Psych: Cognition and judgment appear intact.  Cooperative with normal attention span and concentration.  Behavior appropriate. No anxious or depressed appearing.     Assessment & Plan:   Assessment: Prediabetes HTN Hyperlipidemia Chronic lower extremity edema L>R (eval by previous pcp, related to varicose veins?) CV:  Paroxysmal atrial fibrillation: on xarelto; Rythmol----> pill-in-pocket PVCs -- flecainide prn GI: Dr Veronica Bradford  --GERD (zantac or protonix prn), IBS, chronic diarrhea --GI records: [place], 2004, 2009 8 and 07-2013. Negative for polyps or IBD. + Diverticuli. Next 202 due to [place]. Had a EGD/cscope 11/2016 d -- IBS, Diarrhea OSA--- CPAP, Dr. Maxwell Caul Osteopenia:  Multiple  Dexas, last two:  T score 2009  -1.1 T score 2015  -0.7 DJD --Chronic back pain-- tylenol #3 prn --- RF per PCP --history of epidurals in the past, MRI 2014: Scoliosis,DJD --CIPRO pre dental d/t knee replacement (rx by ortho) H/o endometrial cancer, found during a hysterectomy, no chemotherapy or XRT.  Primary hyperparathyroidism: Chronic hypercalcemia, increased PTH 05/2016,  refered to ENT  PLAN:  DM A1c stable over time.  No change Hyperlipidemia: Well-controlled currently on   Pravachol HTN: Currently well controlled on Diovan 320 mg, takes half B.I.D..  He uses Lasix as needed for edema, Cardizem as needed for PVCs. GI: Had a number of GI symptoms, had a EGD and colonoscopy 12/2016, doing better. Chronic back pain: Sxs still there but stable over time, we discussed possibly referral to orthopedic doctor but she declined, at this point she will not accept any procedures.  She takes Tylenol #3 on average 3 times a week. Osteopenia: Last DEXA.  05/03/2017, T score increased from -0.7 to -1.8 context of primary hyperparathyroidism.  Discussed case with surgery, they are not opposed to start medication.  After talking with the patient we agreed to reassess her T score in 1 year and decide then whether or not she likes to start medication then Aanxiety: +  stress related to her own health issues.  Counseled. Request a dermatology referral for a general screening.  Multiple skin lesions. RTC 6 months   Today, I spent more than  15  min with the patient: >50% of the time counseling regards all her chronic medical problems including back pain management and osteopenia management.

## 2017-05-23 NOTE — Assessment & Plan Note (Addendum)
--  Td 2013; pnm shot 2015, prevnar 2015; zostavax 2013 (had a severe HA after zostavax); shingrex discussed, reluctant to proceed d/t s/e from zostavax --Female care: per gyn, h/o endometrial ca: refer back to Gyn PAP 12- 2017 (-) [facility]-- 05/2017, (-) per pt  --CCS: last cscope 2015; on 11-2016 d/t GI sx had EGD (see report) and Cscope (no report, + polyps per pt); next Cscope per  GI --Diet and exercise discussed --Labs: CMP, CBC, TSH

## 2017-05-24 ENCOUNTER — Telehealth: Payer: Self-pay | Admitting: Internal Medicine

## 2017-05-24 NOTE — Telephone Encounter (Signed)
Copied from [place] [id number]. Topic: Quick Communication - Lab Results >> May 24, 2017  3:09 PM Damita Dunnings, CMA wrote: Called patient to inform them of lab results from 05/23/2017. When patient returns call, triage nurse may disclose results.

## 2017-05-24 NOTE — Telephone Encounter (Signed)
Please advise 

## 2017-05-24 NOTE — Addendum Note (Signed)
Addended byDamita Dunnings D on: 05/24/2017 03:14 PM   Modules accepted: Orders

## 2017-05-24 NOTE — Telephone Encounter (Signed)
Patient notified of labs- she wants to know if the new stain started in December could cause elevated levels. Patient states she only takes Cardizem as needed- maybe once monthly. Labs scheduled for next week.  Patient also wanted to verify that Dr [name] has reviewed her labs from [facility] - She brought CAT scan and labs in- they were all normal from her report.

## 2017-05-25 ENCOUNTER — Other Ambulatory Visit: Payer: Self-pay | Admitting: Internal Medicine

## 2017-05-25 NOTE — Telephone Encounter (Signed)
Spoke w/ Pt, informed to stay on statin. Pt verbalized understanding. Lab appt scheduled 05/29/2017.

## 2017-05-25 NOTE — Telephone Encounter (Signed)
Recommend to stay on the statins and proceed with blood work.

## 2017-05-29 ENCOUNTER — Other Ambulatory Visit (INDEPENDENT_AMBULATORY_CARE_PROVIDER_SITE_OTHER): Payer: Medicare Other

## 2017-05-29 DIAGNOSIS — R7989 Other specified abnormal findings of blood chemistry: Secondary | ICD-10-CM

## 2017-05-29 DIAGNOSIS — R945 Abnormal results of liver function studies: Secondary | ICD-10-CM | POA: Diagnosis not present

## 2017-05-31 ENCOUNTER — Telehealth: Payer: Self-pay | Admitting: *Deleted

## 2017-05-31 LAB — ANA: Anti Nuclear Antibody(ANA): NEGATIVE

## 2017-05-31 LAB — ALPHA-1-ANTITRYPSIN: A1 ANTITRYPSIN SER: 133 mg/dL (ref 83–199)

## 2017-05-31 LAB — TRANSFERRIN SATURATION
IRON SATN MFR SERPL: 18 % Saturation
IRON SERPL-MCNC: 61 ug/dL
TRANSFERRIN SERPL-MCNC: 244 mg/dL

## 2017-05-31 LAB — ANTI-SMOOTH MUSCLE ANTIBODY, IGG: Actin (Smooth Muscle) Antibody (IGG): <20 U (ref <20)

## 2017-05-31 LAB — HEPATITIS B SURFACE ANTIGEN: Hepatitis B Surface Ag: NONREACTIVE

## 2017-05-31 LAB — HEPATITIS B CORE ANTIBODY, TOTAL: Hep B Core Total Ab: NONREACTIVE

## 2017-05-31 NOTE — Telephone Encounter (Signed)
Received Lab Report results from [facility]; forwarded to provider/SLS 05/30

## 2017-06-14 ENCOUNTER — Encounter: Payer: Self-pay | Admitting: Internal Medicine

## 2017-07-14 ENCOUNTER — Other Ambulatory Visit: Payer: Self-pay

## 2017-07-14 ENCOUNTER — Telehealth: Payer: Self-pay | Admitting: Student

## 2017-07-14 ENCOUNTER — Emergency Department (HOSPITAL_COMMUNITY)
Admission: EM | Admit: 2017-07-14 | Discharge: 2017-07-15 | Disposition: A | Discharge status: Home / Self Care | Payer: Medicare Other | Attending: Emergency Medicine | Admitting: Emergency Medicine

## 2017-07-14 ENCOUNTER — Emergency Department (HOSPITAL_COMMUNITY): Payer: Medicare Other

## 2017-07-14 DIAGNOSIS — Z87891 Personal history of nicotine dependence: Secondary | ICD-10-CM | POA: Insufficient documentation

## 2017-07-14 DIAGNOSIS — I10 Essential (primary) hypertension: Secondary | ICD-10-CM | POA: Diagnosis not present

## 2017-07-14 DIAGNOSIS — R002 Palpitations: Secondary | ICD-10-CM | POA: Diagnosis not present

## 2017-07-14 DIAGNOSIS — Z79899 Other long term (current) drug therapy: Secondary | ICD-10-CM | POA: Diagnosis not present

## 2017-07-14 DIAGNOSIS — I480 Paroxysmal atrial fibrillation: Secondary | ICD-10-CM | POA: Diagnosis not present

## 2017-07-14 LAB — BASIC METABOLIC PANEL
Anion gap: 9 (ref 5–15)
BUN: 11 mg/dL (ref 8–23)
CHLORIDE: 110 mmol/L (ref 98–111)
CO2: 23 mmol/L (ref 22–32)
CREATININE: 0.78 mg/dL (ref 0.44–1.00)
Calcium: 10.4 mg/dL — ABNORMAL HIGH (ref 8.9–10.3)
Glucose, Bld: 130 mg/dL — ABNORMAL HIGH (ref 70–99)
POTASSIUM: 4.6 mmol/L (ref 3.5–5.1)
Sodium: 142 mmol/L (ref 135–145)

## 2017-07-14 LAB — CBC
HEMATOCRIT: 47.9 % — AB (ref 36.0–46.0)
Hemoglobin: 15 g/dL (ref 12.0–15.0)
MCH: 28.1 pg (ref 26.0–34.0)
MCHC: 31.3 g/dL (ref 30.0–36.0)
MCV: 89.9 fL (ref 78.0–100.0)
Platelets: 244 10*3/uL (ref 150–400)
RBC: 5.33 MIL/uL — ABNORMAL HIGH (ref 3.87–5.11)
RDW: 14.6 % (ref 11.5–15.5)
WBC: 7 10*3/uL (ref 4.0–10.5)

## 2017-07-14 MED ORDER — ETOMIDATE 2 MG/ML IV SOLN
10.0000 mg | Freq: Once | INTRAVENOUS | Status: DC
  Filled 2017-07-14: qty 10

## 2017-07-14 NOTE — ED Triage Notes (Signed)
Patient c/o AFIB since last night at 01:30a.m. Taking her diltiazem q4 - q6 hours per her provider instruction (she has medication for PRN afib). Denies SOB and CP, only pressure.

## 2017-07-14 NOTE — ED Provider Notes (Signed)
[facility] EMERGENCY DEPARTMENT Provider Note   CSN: [id number] Arrival date & time: 07/14/17  2150     History   Chief Complaint Chief Complaint  Patient presents with  . Palpitations    HPI Veronica Bradford is a 72 y.o. female.  HPI  This is a 72 year old female with a history of hypertension, hyperlipidemia, paroxysmal atrial fibrillation on Xarelto who presents with atrial fibrillation.  Patient reports that she woke up this morning at 1:30 AM with palpitations and palpitations in her left ear.  She states that at baseline her heart rate is in the 50s.  She is very symptomatic when her heart rate is over 100.  She took 300 mg of flecainide at 1:30 AM.  Heart rate continued to be elevated in the 115's.  At 3 AM she took a dose of diltiazem.  She took an additional dose around mid morning and 1 at 4 PM.  She called her cardiology office who recommended her scheduling her diltiazem every 6 hours.  Last diltiazem dose was around 4.  Patient reports that after dinner she noted her heart rate was in the 120s.  She became concerned.  She denies any chest pain, shortness of breath, recent illnesses or fevers.  She denies any recent increase caffeine use.  No changes in medication.  No recent sicknesses.  She does report that she takes her Xarelto daily as prescribed.  Primary cardiologist:  Dr. Caryl Comes  Past Medical History:  Diagnosis Date  . Back pain   . Chronic sinusitis    s/p 2 surgeries remotely, [name]  . Closed fracture of metatarsal of left foot    L foot, fifth metatarsal  . Ectopic pregnancy   . Endometrial cancer ([place]) 2004   Early diagnosis  . GERD (gastroesophageal reflux disease)   . Hyperlipemia   . Hypertension   . IBS (irritable bowel syndrome) 08/13/2014   Dx 2015, diarrhea on-off, Dr [name]   . Lumbar radiculopathy 08/2012   MRI in 08/2012  . Mild depression ([place])    Pt's son has Down's Syndrome  . Mild hyperparathyroidism ([place])   .  Osteopenia   . PAF (paroxysmal atrial fibrillation) ([place])   . Prediabetes   . Sciatica of right side 08/2013   Received Depomedrol 80 mg injection  . Sleep apnea 09/09/2013   Dx with OSA in 2015, by Dr. Caryl Comes, APAP    Patient Active Problem List   Diagnosis Date Noted  . Osteopenia 05/23/2017  . Annual physical exam 04/14/2015  . PCP NOTES >>>>> 12/23/2014  . IBS (irritable bowel syndrome) 08/13/2014  . Hyperglycemia   . Chronic sinusitis   . Endometrial cancer ([place])   . Back pain-- on tylenol #3 prn   . GERD (gastroesophageal reflux disease)   . HTN (hypertension) 09/09/2013  . Diastolic dysfunction-grade 2 09/09/2013  . Chronic anticoagulation 09/09/2013  . Edema of both legs 09/09/2013  . Dyslipidemia 09/09/2013  . Obesity (BMI 30-39.9) 09/09/2013  . Sleep apnea-- Dr Maxwell Caul, on CPAP 09/09/2013  . PAF (paroxysmal atrial fibrillation) ([place]) 06/20/2013    Past Surgical History:  Procedure Laterality Date  . ABDOMINAL HYSTERECTOMY    . CHOLECYSTECTOMY  ~2006  . DILATION AND CURETTAGE OF UTERUS     after SAB  . ESOPHAGOGASTRODUODENOSCOPY  02/09/2014   Dr. [name]  . FINGER SURGERY Left    index  . NASAL SINUS SURGERY     x 2 remotely, [name]  . TONSILLECTOMY    .  TOTAL ABDOMINAL HYSTERECTOMY W/ BILATERAL SALPINGOOPHORECTOMY    . TOTAL KNEE ARTHROPLASTY Right ~2008     OB History    Gravida  4   Para  1   Term  1   Preterm      AB  3   Living  13     SAB  3   TAB      Ectopic      Multiple      Live Births               Home Medications    Prior to Admission medications   Medication Sig Start Date End Date Taking? Authorizing Provider  cetirizine (ZYRTEC) 10 MG tablet Take 10 mg by mouth as needed for allergies.   Yes [provider]  cholecalciferol (VITAMIN D) 1000 UNITS tablet Take 2,000 Units by mouth daily.    Yes [provider]  ciprofloxacin (CIPRO) 500 MG tablet Take 500 mg by mouth as needed (prior to dental  work).   Yes [provider]  Coenzyme Q10 (CO Q 10) 10 MG CAPS Take 100 mg by mouth daily.    Yes [provider]  CRANBERRY PO Take 1 capsule by mouth as needed.    Yes [provider]  Cyanocobalamin (VITAMIN B-12 PO) Take 1 tablet by mouth daily.   Yes [provider]  diltiazem (CARDIZEM) 30 MG tablet Take 1 tablet every 4 hours AS NEEDED for heart rate >100 as long as blood pressure >100. 01/15/17  Yes Sherran Needs, NP  flecainide (TAMBOCOR) 150 MG tablet Take 2 tablets (300 mg total) by mouth as needed (FOR AFIB). Pill in a pocket 11/27/16  Yes Baldwin Jamaica, PA-C  fluticasone [facility]) 50 MCG/ACT nasal spray Place 1 spray into both nostrils as needed for allergies or rhinitis.    Yes [provider]  furosemide (LASIX) 20 MG tablet Take 1 tablet (20 mg total) by mouth daily as needed for fluid. 11/27/16  Yes Baldwin Jamaica, PA-C  MAGNESIUM-OXIDE PO Take 500 mg by mouth 2 (two) times daily.   Yes [provider]  Multiple Vitamins-Minerals (MULTIVITAMIN PO) Take 1 tablet by mouth daily.   Yes [provider]  Omega-3 Fatty Acids (FISH OIL) 1200 MG CAPS Take 1,200 mg by mouth daily.   Yes [provider]  pantoprazole (PROTONIX) 40 MG tablet Take 40 mg by mouth daily.   Yes [provider]  pravastatin (PRAVACHOL) 40 MG tablet Take 1 tablet (40 mg total) by mouth at bedtime. 02/12/17  Yes Colon Branch, MD  propafenone (RYTHMOL) 225 MG tablet Take 2 tablets by mouth daily as needed for afib 11/27/16  Yes Baldwin Jamaica, PA-C  valsartan (DIOVAN) 320 MG tablet Take 1 tablet (320 mg total) by mouth daily. Patient taking differently: Take 160 mg by mouth 2 (two) times daily.  05/25/17  Yes Paz, Alda Berthold, MD  XARELTO 20 MG TABS tablet TAKE 1 TABLET BY MOUTH  DAILY WITH SUPPER 05/16/17  Yes [name], MD  acetaminophen-codeine (TYLENOL #3) 300-30 MG tablet Take 1 tablet by mouth daily as needed for moderate  pain. Patient not taking: Reported on 07/15/2017 05/23/17   Colon Branch, MD    Family History Family History  Problem Relation Age of Onset  . Hypertension Mother   . Colon cancer Mother   . Breast cancer Mother   . Ovarian cancer Mother   . Hypertension Father   .  Diabetes Father   . Head & neck cancer Sister   . CAD Neg Hx     Social History Social History   Tobacco Use  . Smoking status: Former Smoker    Last attempt to quit: 01/06/1980    Years since quitting: 37.5  . Smokeless tobacco: Never Used  . Tobacco comment: smoked from 1970 to 1982, less than 1 ppd  Substance Use Topics  . Alcohol use: Yes    Comment: rare  . Drug use: No     Allergies   Nsaids; Tolmetin; Zoster vaccine live; and Amoxicillin   Review of Systems Review of Systems  Constitutional: Negative for fever.  Respiratory: Negative for shortness of breath.   Cardiovascular: Positive for palpitations. Negative for chest pain.  Gastrointestinal: Negative for abdominal pain, nausea and vomiting.  Genitourinary: Negative for dysuria.  All other systems reviewed and are negative.    Physical Exam Updated Vital Signs BP (!) 145/68   Pulse 62   Temp 98.4 F (36.9 C) (Oral)   Resp 17   Ht 5\' 8"  (1.727 m)   Wt 115.7 kg (255 lb)   SpO2 99%   BMI 38.77 kg/m   Physical Exam  Constitutional: She is oriented to person, place, and time. She appears well-developed and well-nourished.  Overweight  HENT:  Head: Normocephalic and atraumatic.  Neck: Neck supple.  Cardiovascular: Normal heart sounds.  No murmur heard. Irregular rhythm  Pulmonary/Chest: Effort normal and breath sounds normal. No respiratory distress. She has no wheezes.  Abdominal: Soft. Bowel sounds are normal. There is no tenderness.  Musculoskeletal: She exhibits edema.  Neurological: She is alert and oriented to person, place, and time.  Skin: Skin is warm and dry.  Psychiatric: She has a normal mood and affect.  Nursing note  and vitals reviewed.    ED Treatments / Results  Labs (all labs ordered are listed, but only abnormal results are displayed) Labs Reviewed  BASIC METABOLIC PANEL - Abnormal; Notable for the following components:      Result Value   Glucose, Bld 130 (*)    Calcium 10.4 (*)    All other components within normal limits  CBC - Abnormal; Notable for the following components:   RBC 5.33 (*)    HCT 47.9 (*)    All other components within normal limits  MAGNESIUM    EKG EKG Interpretation  Date/Time:  Saturday July 14 2017 21:59:42 EDT Ventricular Rate:  113 PR Interval:    QRS Duration: 148 QT Interval:  322 QTC Calculation: 441 R Axis:   -53 Text Interpretation:  Atrial fibrillation Right bundle branch block Left anterior fascicular block  Bifascicular block  Left ventricular hypertrophy with repolarization abnormality Cannot rule out Septal infarct , age undetermined Abnormal ECG Faster when compared to prior Reconfirmed by Thayer Jew [phone]) on 07/14/2017 11:12:14 PM   Radiology Dg Chest 2 View  Result Date: 07/14/2017 CLINICAL DATA:  Heart palpitations EXAM: CHEST - 2 VIEW COMPARISON:  None. FINDINGS: The heart size and mediastinal contours are within normal limits. Both lungs are clear. The visualized skeletal structures are unremarkable. IMPRESSION: No active cardiopulmonary disease. Electronically Signed   By: Ulyses Jarred M.D.   On: 07/14/2017 22:30    Procedures .Cardioversion Date/Time: 07/15/2017 1:11 AM Performed by: Merryl Hacker, MD Authorized by: Merryl Hacker, MD   Consent:    Consent obtained:  Verbal and written   Consent given by:  Patient   Risks discussed:  Induced  arrhythmia, death and pain   Alternatives discussed:  Rate-control medication, delayed treatment and observation Pre-procedure details:    Cardioversion basis:  Emergent   Rhythm:  Atrial fibrillation   Electrode placement:  Anterior-posterior Patient sedated: Yes. Refer to  sedation procedure documentation for details of sedation.  Attempt one:    Cardioversion mode:  Synchronous   Shock (Joules):  200   Shock outcome:  Conversion to normal sinus rhythm Post-procedure details:    Patient status:  Awake   Patient tolerance of procedure:  Tolerated well, no immediate complications .Sedation Date/Time: 07/15/2017 1:11 AM Performed by: Merryl Hacker, MD Authorized by: Merryl Hacker, MD   Consent:    Consent obtained:  Written   Consent given by:  Patient   Risks discussed:  Allergic reaction, inadequate sedation, nausea, vomiting, respiratory compromise necessitating ventilatory assistance and intubation and prolonged sedation necessitating reversal Universal protocol:    Procedure explained and questions answered to patient or proxy's satisfaction: yes     Relevant documents present and verified: yes     Immediately prior to procedure a time out was called: yes   Indications:    Procedure performed:  Cardioversion   Procedure necessitating sedation performed by:  Physician performing sedation   Intended level of sedation:  Deep Pre-sedation assessment:    Time since last food or drink:  8p   ASA classification: class 3 - patient with severe systemic disease     Neck mobility: normal     Mouth opening:  2 finger widths   Thyromental distance:  3 finger widths   Mallampati score:  II - soft palate, uvula, fauces visible   Pre-sedation assessments completed and reviewed: airway patency, cardiovascular function, hydration status and respiratory function   Immediate pre-procedure details:    Reassessment: Patient reassessed immediately prior to procedure     Reviewed: vital signs and relevant labs/tests     Verified: bag valve mask available, emergency equipment available, intubation equipment available, IV patency confirmed and oxygen available   Procedure details (see MAR for exact dosages):    Preoxygenation:  Nasal cannula   Sedation:   Etomidate   Intra-procedure monitoring:  Blood pressure monitoring, continuous capnometry, frequent vital sign checks and continuous pulse oximetry   Intra-procedure events: none     Total Provider sedation time (minutes):  10 Post-procedure details:    Attendance: Constant attendance by certified staff until patient recovered     Recovery: Patient returned to pre-procedure baseline     Post-sedation assessments completed and reviewed: airway patency, cardiovascular function and hydration status     Patient is stable for discharge or admission: yes     Patient tolerance:  Tolerated well, no immediate complications   (including critical care time)  CRITICAL CARE Performed by: Merryl Hacker   Total critical care time: 30 minutes  Critical care time was exclusive of separately billable procedures and treating other patients.  Critical care was necessary to treat or prevent imminent or life-threatening deterioration.  Critical care was time spent personally by me on the following activities: development of treatment plan with patient and/or surrogate as well as nursing, discussions with consultants, evaluation of patient's response to treatment, examination of patient, obtaining history from patient or surrogate, ordering and performing treatments and interventions, ordering and review of laboratory studies, ordering and review of radiographic studies, pulse oximetry and re-evaluation of patient's condition.   Medications Ordered in ED Medications  etomidate (AMIDATE) injection 10 mg (has no administration in  time range)  etomidate (AMIDATE) injection (8 mg Intravenous Given 07/15/17 0022)     Initial Impression / Assessment and Plan / ED Course  I have reviewed the triage vital signs and the nursing notes.  Pertinent labs & imaging results that were available during my care of the patient were reviewed by me and considered in my medical decision making (see chart for  details).  Clinical Course as of Jul 15 108  Nancy Fetter Jul 15, 2017  0046 Cardioversion successful with conversion to sinus rhythm.  Postconversion, patient is now awake and alert.  Will monitor for short duration.  Will ensure that she can walk and tolerate fluids without difficulty.   [CH]  0109 Patient ambulatory and back to baseline post cardioversion.  She has a ride home.  She remains in sinus rhythm with a rate in the 50s to 60s.   [CH]    Clinical Course User Index [CH] Javarion Douty, Barbette Hair, MD    This patients CHA2DS2-VASc Score and unadjusted Ischemic Stroke Rate (% per year) is equal to 3.2 % stroke rate/year from a score of 3  Above score calculated as 1 point each if present [CHF, HTN, DM, Vascular=MI/PAD/Aortic Plaque, Age if 65-74, or Female] Above score calculated as 2 points each if present [Age > 75, or Stroke/TIA/TE]   Patient presents in atrial fibrillation.  She is very symptomatic even at fairly good rate control.  Rates in the emergency department between 90 and 110.  She reports compliance with her Xarelto.  I discussed with the patient at length risk and benefits of electrocardioversion.  She is a candidate for electrocardioversion.  She otherwise does not appear to have any contraindications.  She is followed closely by cardiology.  Her lab work-up was reviewed.  No significant metabolic derangements.  Chest x-ray reassuring.  Troponin negative.  Patient was sedated and cardioverted without any adverse events.  She returned to her baseline post cardioversion.  We will have her follow-up with cardiology on Monday.  After history, exam, and medical workup I feel the patient has been appropriately medically screened and is safe for discharge home. Pertinent diagnoses were discussed with the patient. Patient was given return precautions.   Final Clinical Impressions(s) / ED Diagnoses   Final diagnoses:  Paroxysmal atrial fibrillation [facility])    ED Discharge Orders         Ordered    Amb referral to AFIB Clinic     07/14/17 2320       Merryl Hacker, MD 07/15/17 0116

## 2017-07-14 NOTE — Telephone Encounter (Addendum)
    Patient called the after-hours line to report she had gone into atrial fibrillation around 0100 last night as she felt her heart skipping. HR was in the 80's at that time. She took 300mg  of Flecainide as this has converted her in the past. She reports that she feels like she is still in atrial fibrillation and heart rate has varied from the 80's to 110's. She denies any associated dyspnea but does feel her heart skipping. No associated chest pain, lightheadedness, dizziness, or presyncope.  I recommended that she continue to take Cardizem 30 mg every 6 hours and continue to follow heart rate and BP. Advised against using Flecainide again as this should not be used twice within 24 hours. Will send a note to the atrial fibrillation clinic so she has close follow-up. She was advised to go to the emergency department if she develops rapid palpitations, dyspnea, or any other acute changes. She voiced understanding of this and was appreciative of the call.  Signed, Erma Heritage, PA-C 07/14/2017, 3:10 PM Pager: [phone]

## 2017-07-15 LAB — MAGNESIUM: MAGNESIUM: 2.3 mg/dL (ref 1.7–2.4)

## 2017-07-15 MED ORDER — ETOMIDATE 2 MG/ML IV SOLN
INTRAVENOUS | Status: AC | PRN
  Administered 2017-07-15: 8 mg via INTRAVENOUS

## 2017-07-15 NOTE — Discharge Instructions (Addendum)
You were seen today for atrial fibrillation.  You underwent cardioversion and sedation.  You successfully converted to sinus rhythm.  Continue medications at home as previously prescribed.  Follow-up with cardiology.

## 2017-07-16 ENCOUNTER — Encounter: Payer: Self-pay | Admitting: Obstetrics & Gynecology

## 2017-07-16 ENCOUNTER — Ambulatory Visit (INDEPENDENT_AMBULATORY_CARE_PROVIDER_SITE_OTHER): Payer: Medicare Other | Admitting: Obstetrics & Gynecology

## 2017-07-16 ENCOUNTER — Telehealth: Payer: Self-pay | Admitting: Internal Medicine

## 2017-07-16 VITALS — BP 109/50 | HR 62 | Ht 68.0 in | Wt 256.1 lb

## 2017-07-16 DIAGNOSIS — Z01419 Encounter for gynecological examination (general) (routine) without abnormal findings: Secondary | ICD-10-CM | POA: Diagnosis not present

## 2017-07-16 NOTE — Telephone Encounter (Signed)
Pt calling to let us know that she went to ED and was cardioverted Sat around midnight. She has had PVC's with some lighthededness and Bigeminy this am, off and on-was told to let us know-asking if fu needed? Pls advise [phone]

## 2017-07-16 NOTE — Telephone Encounter (Signed)
Pt feeling better now. Rhythm is normalized. Appt made for 7/17

## 2017-07-16 NOTE — Progress Notes (Signed)
Subjective:     Veronica Bradford is a 72 y.o. female here for a routine exam.  [id number] LMP 72yo. Pt was dx'd uterine ca. Pt was s/p surgery. NO chemo or radiation.   Current complaints: feeling extra heart beats.  Pt was recently at the hosp for afib. S/p cardioversion. Pt is a prev ICU nurse.      Gynecologic History No LMP recorded. Patient has had a hysterectomy. Contraception: status post hysterectomy Last Pap: WNL. Results were: normal. 04/29/2015. Pt does not have a uterus.   Last mammogram: 05/03/2017. Results were: normal  Obstetric History OB History  Gravida Para Term Preterm AB Living  4 1 1   3 1   SAB TAB Ectopic Multiple Live Births  2   1   1     # Outcome Date GA Lbr Len/2nd Weight Sex Delivery Anes PTL Lv  4 SAB 1989          3 SAB 1986          2 Term 43 [redacted]w[redacted]d   M Vag-Spont None  LIV  1 Ectopic 1980           The following portions of the patient's history were reviewed and updated as appropriate: allergies, current medications, past family history, past medical history, past social history, past surgical history and problem list.  Patient Active Problem List   Diagnosis Date Noted  . Osteopenia 05/23/2017  . Annual physical exam 04/14/2015  . PCP NOTES >>>>> 12/23/2014  . IBS (irritable bowel syndrome) 08/13/2014  . Hyperglycemia   . Chronic sinusitis   . Endometrial cancer ([place])   . Back pain-- on tylenol #3 prn   . GERD (gastroesophageal reflux disease)   . HTN (hypertension) 09/09/2013  . Diastolic dysfunction-grade 2 09/09/2013  . Chronic anticoagulation 09/09/2013  . Edema of both legs 09/09/2013  . Dyslipidemia 09/09/2013  . Obesity (BMI 30-39.9) 09/09/2013  . Sleep apnea-- Dr Maxwell Caul, on CPAP 09/09/2013  . PAF (paroxysmal atrial fibrillation) ([place]) 06/20/2013   Review of Systems Pertinent items are noted in HPI.    Objective:  BP (!) 109/50   Pulse 62   Ht 5\' 8"  (1.727 m)   Wt 256 lb 1.9 oz (116.2 kg)   BMI 38.94 kg/m  General Appearance:     Alert, cooperative, no distress, appears stated age  Head:    Normocephalic, without obvious abnormality, atraumatic  Eyes:    conjunctiva/corneas clear, EOM's intact, both eyes  Ears:    Normal external ear canals, both ears  Nose:   Nares normal, septum midline, mucosa normal, no drainage    or sinus tenderness  Throat:   Lips, mucosa, and tongue normal; teeth and gums normal  Neck:   Supple, symmetrical, trachea midline, no adenopathy;    thyroid:  no enlargement/tenderness/nodules  Back:     Symmetric, no curvature, ROM normal, no CVA tenderness  Lungs:     Clear to auscultation bilaterally, respirations unlabored  Chest Wall:    No tenderness or deformity. Pt has multiple skipped beats and ectopic beats- asymptomatic.    Heart:    Regular rate and rhythm, S1 and S2 normal, no murmur, rub   or gallop  Breast Exam:    No tenderness, masses, or nipple abnormality  Abdomen:     Soft, non-tender, bowel sounds active all four quadrants,    no masses, no organomegaly  Genitalia:    Normal female without lesion, discharge or tenderness; cervix and uterus surgically absent.  Extremities:   Extremities normal, atraumatic, no cyanosis or edema  Pulses:   2+ and symmetric all extremities  Skin:   Skin color, texture, turgor normal, no rashes or lesions     Assessment:    Healthy female exam.   H/o Uterine cancer   No further PAP indicated  F/u ANNUALLY for GYN and PELVIC exam .   afib- s/p cardioversion 2 days prev.    Plan:     F/u with Cards for full eval  Pt also followed by the afib clinic. Did leave a message and will expect a call back later today Annual mammogram   Manessa Buley L. Harraway-Smith, M.D., Cherlynn June

## 2017-07-18 ENCOUNTER — Encounter (HOSPITAL_COMMUNITY): Payer: Self-pay | Admitting: Nurse Practitioner

## 2017-07-18 ENCOUNTER — Ambulatory Visit (HOSPITAL_COMMUNITY)
Admission: RE | Admit: 2017-07-18 | Discharge: 2017-07-18 | Disposition: A | Discharge status: Home / Self Care | Payer: Medicare Other | Source: Ambulatory Visit | Attending: Nurse Practitioner | Admitting: Nurse Practitioner

## 2017-07-18 VITALS — BP 138/80 | HR 56 | Ht 68.0 in | Wt 254.0 lb

## 2017-07-18 DIAGNOSIS — Z87891 Personal history of nicotine dependence: Secondary | ICD-10-CM | POA: Insufficient documentation

## 2017-07-18 DIAGNOSIS — Z8249 Family history of ischemic heart disease and other diseases of the circulatory system: Secondary | ICD-10-CM | POA: Diagnosis not present

## 2017-07-18 DIAGNOSIS — K219 Gastro-esophageal reflux disease without esophagitis: Secondary | ICD-10-CM | POA: Insufficient documentation

## 2017-07-18 DIAGNOSIS — G4733 Obstructive sleep apnea (adult) (pediatric): Secondary | ICD-10-CM | POA: Insufficient documentation

## 2017-07-18 DIAGNOSIS — Z79899 Other long term (current) drug therapy: Secondary | ICD-10-CM | POA: Diagnosis not present

## 2017-07-18 DIAGNOSIS — I4510 Unspecified right bundle-branch block: Secondary | ICD-10-CM | POA: Diagnosis not present

## 2017-07-18 DIAGNOSIS — R002 Palpitations: Secondary | ICD-10-CM | POA: Diagnosis not present

## 2017-07-18 DIAGNOSIS — Z800 Family history of malignant neoplasm of digestive organs: Secondary | ICD-10-CM | POA: Insufficient documentation

## 2017-07-18 DIAGNOSIS — Z880 Allergy status to penicillin: Secondary | ICD-10-CM | POA: Diagnosis not present

## 2017-07-18 DIAGNOSIS — F329 Major depressive disorder, single episode, unspecified: Secondary | ICD-10-CM | POA: Insufficient documentation

## 2017-07-18 DIAGNOSIS — Z96651 Presence of right artificial knee joint: Secondary | ICD-10-CM | POA: Insufficient documentation

## 2017-07-18 DIAGNOSIS — R001 Bradycardia, unspecified: Secondary | ICD-10-CM | POA: Insufficient documentation

## 2017-07-18 DIAGNOSIS — M5416 Radiculopathy, lumbar region: Secondary | ICD-10-CM | POA: Insufficient documentation

## 2017-07-18 DIAGNOSIS — R7303 Prediabetes: Secondary | ICD-10-CM | POA: Insufficient documentation

## 2017-07-18 DIAGNOSIS — Z8542 Personal history of malignant neoplasm of other parts of uterus: Secondary | ICD-10-CM | POA: Diagnosis not present

## 2017-07-18 DIAGNOSIS — E785 Hyperlipidemia, unspecified: Secondary | ICD-10-CM | POA: Diagnosis not present

## 2017-07-18 DIAGNOSIS — M8580 Other specified disorders of bone density and structure, unspecified site: Secondary | ICD-10-CM | POA: Diagnosis not present

## 2017-07-18 DIAGNOSIS — I10 Essential (primary) hypertension: Secondary | ICD-10-CM | POA: Insufficient documentation

## 2017-07-18 DIAGNOSIS — K589 Irritable bowel syndrome without diarrhea: Secondary | ICD-10-CM | POA: Insufficient documentation

## 2017-07-18 DIAGNOSIS — I480 Paroxysmal atrial fibrillation: Secondary | ICD-10-CM | POA: Diagnosis not present

## 2017-07-18 NOTE — Progress Notes (Signed)
Primary Care Physician: Colon Branch, MD Referring Physician: [facility] triage EP: Dr. Pat Kocher, PA-C   Veronica Bradford is a 72 y.o. female with a h/o paroxsymal afib  followed by Dr. Caryl Comes. She usually will take EITHER flecainide or propafenone to help restore SR. She is in afib clinic for 4 month f/u from last visit with [facility].  She notes that she was seen in the ER here in December with RVR around 200 mg after taking PIP Rythmol.  She did convert with cardizem IV. Sine then she has had a couple episodes of afib and has taken flecainide with conversion in 1-2 hours. She is afraid to take the rythmol again for the RVR associated with it. She always starts out with 30 mg cardizem before any antiarrythmic.She was on xarelto 20 mg daily, states no missed doses.She is currently happy with the PIP approach and does not want daily antiarrythmic.  Pt is back in afib clinic after having an urgent cardioversion in the  ER, 7/13. She developed afib and took her 300 mg flecainide x 1 and was using Cardizem 30 mg every few hours. After 23 hours and failure to convert,  with continued RVR and lightheadedness, she went to the ER and had successful cardioversion. She states the last time she had afib was 8 weeks ago and the PIP flecainide converted her. No obvious triggers. 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  Today, she denies symptoms of   chest pain, shortness of breath, orthopnea, PND, lower extremity edema, dizziness, presyncope, syncope, or neurologic sequela. Positive for palpitations/ fatigue.The patient is tolerating medications without difficulties and is otherwise without complaint today.   Past Medical History:  Diagnosis Date  . Back pain   . Chronic sinusitis    s/p 2 surgeries remotely, [name]  . Closed fracture of metatarsal of left foot    L foot, fifth metatarsal  . Ectopic pregnancy     . Endometrial cancer ([place]) 2004   Early diagnosis  . GERD (gastroesophageal reflux disease)   . Hyperlipemia   . Hypertension   . IBS (irritable bowel syndrome) 08/13/2014   Dx 2015, diarrhea on-off, Dr [name]   . Lumbar radiculopathy 08/2012   MRI in 08/2012  . Mild depression ([place])    Pt's son has Down's Syndrome  . Mild hyperparathyroidism ([place])   . Osteopenia   . PAF (paroxysmal atrial fibrillation) ([place])   . Prediabetes   . Sciatica of right side 08/2013   Received Depomedrol 80 mg injection  . Sleep apnea 09/09/2013   Dx with OSA in 2015, by Dr. Caryl Comes, APAP   Past Surgical History:  Procedure Laterality Date  . ABDOMINAL HYSTERECTOMY    . CHOLECYSTECTOMY  ~2006  . DILATION AND CURETTAGE OF UTERUS     after SAB  . ESOPHAGOGASTRODUODENOSCOPY  02/09/2014   Dr. [name]  . FINGER SURGERY Left    index  . NASAL SINUS SURGERY     x 2 remotely, [name]  . TONSILLECTOMY    . TOTAL ABDOMINAL HYSTERECTOMY W/ BILATERAL SALPINGOOPHORECTOMY    . TOTAL KNEE ARTHROPLASTY Right ~2008    Current Outpatient Medications  Medication Sig Dispense Refill  . acetaminophen-codeine (TYLENOL #3) 300-30 MG tablet Take 1 tablet by mouth daily as needed for moderate pain. 30 tablet 0  . cetirizine (ZYRTEC) 10 MG tablet Take 10  mg by mouth as needed for allergies.    . cholecalciferol (VITAMIN D) 1000 UNITS tablet Take 2,000 Units by mouth daily.     . ciprofloxacin (CIPRO) 500 MG tablet Take 500 mg by mouth as needed (prior to dental work).    . Coenzyme Q10 (CO Q 10) 10 MG CAPS Take 100 mg by mouth daily.     . Cyanocobalamin (VITAMIN B-12 PO) Take 1 tablet by mouth daily.    Marland Kitchen diltiazem (CARDIZEM) 30 MG tablet Take 1 tablet every 4 hours AS NEEDED for heart rate >100 as long as blood pressure >100. 45 tablet 1  . flecainide (TAMBOCOR) 150 MG tablet Take 2 tablets (300 mg total) by mouth as needed (FOR AFIB). Pill in a pocket 30 tablet 6  . furosemide (LASIX) 20 MG tablet Take 1 tablet (20 mg  total) by mouth daily as needed for fluid. 30 tablet 6  . MAGNESIUM-OXIDE PO Take 500 mg by mouth 2 (two) times daily.    . Multiple Vitamins-Minerals (MULTIVITAMIN PO) Take 1 tablet by mouth daily.    . Omega-3 Fatty Acids (FISH OIL) 1200 MG CAPS Take 1,200 mg by mouth daily.    . pantoprazole (PROTONIX) 40 MG tablet Take 40 mg by mouth daily.    . pravastatin (PRAVACHOL) 40 MG tablet Take 1 tablet (40 mg total) by mouth at bedtime. 90 tablet 1  . propafenone (RYTHMOL) 225 MG tablet Take 2 tablets by mouth daily as needed for afib 30 tablet 6  . valsartan (DIOVAN) 320 MG tablet Take 1 tablet (320 mg total) by mouth daily. (Patient taking differently: Take 160 mg by mouth 2 (two) times daily. ) 90 tablet 1  . XARELTO 20 MG TABS tablet TAKE 1 TABLET BY MOUTH  DAILY WITH SUPPER 90 tablet 2  . CRANBERRY PO Take 1 capsule by mouth as needed.     . fluticasone (FLONASE) 50 MCG/ACT nasal spray Place 1 spray into both nostrils as needed for allergies or rhinitis.      No current facility-administered medications for this encounter.     Allergies  Allergen Reactions  . Nsaids Other (See Comments)    Pt on Xarelto  . Tolmetin Other (See Comments)    Pt on Xarelto  . Zoster Vaccine Live Other (See Comments)    Sickness per Pt.   Marland Kitchen Amoxicillin Rash    Social History   Socioeconomic History  . Marital status: Married    Spouse name: Not on file  . Number of children: 1  . Years of education: Not on file  . Highest education level: Not on file  Occupational History  . 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 Financial resource strain: Not on file  . Food insecurity:    Worry: Not on file    Inability: Not on file  . Transportation needs:    Medical: Not on file    Non-medical: Not on file  Tobacco Use  . Smoking status: Former Smoker    Last attempt to quit: 01/06/1980    Years since quitting: 37.5  . Smokeless tobacco: Never Used  . Tobacco comment: smoked from 1970 to  1982, less than 1 ppd  Substance and Sexual Activity  . Alcohol use: Yes    Comment: rare  . Drug use: No  . Sexual activity: Not Currently    Birth control/protection: None  Lifestyle  . Physical activity:    Days per week: Not on file  Minutes per session: Not on file  . Stress: Not on file  Relationships  . Social connections:    Talks on phone: Not on file    Gets together: Not on file    Attends religious service: Not on file    Active member of club or organization: Not on file    Attends meetings of clubs or organizations: Not on file    Relationship status: Not on file  . Intimate partner violence:    Fear of current or ex partner: Not on file    Emotionally abused: Not on file    Physically abused: Not on file    Forced sexual activity: Not on file  Other Topics Concern  . Not on file  Social History Narrative   Lives w/ husband, and son [name]    Family History  Problem Relation Age of Onset  . Hypertension Mother   . Colon cancer Mother   . Breast cancer Mother   . Ovarian cancer Mother   . Hypertension Father   . Diabetes Father   . Head & neck cancer Sister   . CAD Neg Hx     ROS- All systems are reviewed and negative except as per the HPI above  Physical Exam: Vitals:   07/18/17 1118  BP: 138/80  Pulse: (!) 56  Weight: 254 lb (115.2 kg)  Height: 5\' 8"  (1.727 m)   Wt Readings from Last 3 Encounters:  07/18/17 254 lb (115.2 kg)  07/16/17 256 lb 1.9 oz (116.2 kg)  07/14/17 255 lb (115.7 kg)    Labs: Lab Results  Component Value Date   NA 142 07/14/2017   K 4.6 07/14/2017   CL 110 07/14/2017   CO2 23 07/14/2017   GLUCOSE 130 (H) 07/14/2017   BUN 11 07/14/2017   CREATININE 0.78 07/14/2017   CALCIUM 10.4 (H) 07/14/2017   MG 2.3 07/14/2017   Lab Results  Component Value Date   INR 2.5 (H) 11/08/2006   Lab Results  Component Value Date   CHOL 177 03/19/2017   HDL 58.00 03/19/2017   LDLCALC 98 03/19/2017   TRIG 104.0 03/19/2017      GEN- The patient is well appearing, alert and oriented x 3 today.   Head- normocephalic, atraumatic Eyes-  Sclera clear, conjunctiva pink Ears- hearing intact Oropharynx- clear Neck- supple, no JVP Lymph- no cervical lymphadenopathy Lungs- Clear to ausculation bilaterally, normal work of breathing Heart- regular rate and rhythm, no murmurs, rubs or gallops, PMI not laterally displaced GI- soft, NT, ND, + BS Extremities- no clubbing, cyanosis, or edema MS- no significant deformity or atrophy Skin- no rash or lesion Psych- euthymic mood, full affect Neuro- strength and sensation are intact  EKG- Sinus brady at 56 bpm, PR int 204 ms, qrs int 158 bpm, qtc 445 ms Epic records reviewed    Assessment and Plan: 1. Paroxysmal afib  PIP flecainide failed to convert pt Successful cardioversion in ER, 7/13, but afib episodes are occurring more often, last episode 8 weeks ago Prefers not to take propafenone as she had HR around 200 bpm with last use I hesitate to recommend daily flecainide for which she would ideally need concomitant rate control drug, for baseline RBBB, borderline first degree block and brady at baseline.  She may be an ablation candidate or change of antiarrythmic, to one that would not cause a chronotropic response    Continue xarelto 20 mg daily for chadsvasc score of at least 3  I will get  her back to Dr. Caryl Comes to discuss   Geroge Baseman. Rozella Servello, [facility] [address] [place], [place] [address] [phone]

## 2017-07-25 NOTE — Progress Notes (Signed)
[facility]   Patient Details  Name: RUDIE RIKARD MRN: [id number] Date of Birth: [birth year] Age: 72 y.o. PCP: Colon Branch, MD  Vitals:   07/23/17 0945  BP: (!) 142/70  Pulse: (!) 56  Resp: 18  SpO2: 97%  Weight: 258 lb 12.8 oz (117.4 kg)     [facility] Eval - 07/25/17 0900      Referral    Referring Provider  AFIB clinic    Reason for referral  Obesitity/Overweight;Hypertension;Inactivity;Orthopedic;Other    Program Start Date  07/25/17      Measurement   Waist Circumference  47 inches    Hip Circumference  56.5 inches    Body fat  48.2 percent      Information for Trainer   Goals  "increase fitness, core, balance"    Current Exercise  "a tiny bit of walking"    Orthopedic Concerns  "RTKR, low back pain, LT knee pain"    Pertinent Medical History  "AFIB, HTN, Obesity"    Current Barriers  Kneeling RT knee      Mobility and Daily Activities   I find it easy to walk up or down two or more flights of stairs.  3    I have no trouble taking out the trash.  4    I do housework such as vacuuming and dusting on my own without difficulty.  4    I can easily lift a gallon of milk (8lbs).  4    I can easily walk a mile.  2    I have no trouble reaching into high cupboards or reaching down to pick up something from the floor.  4    I do not have trouble doing out-door work such as Armed forces logistics/support/administrative officer, raking leaves, or gardening.  3      Mobility and Daily Activities   I feel younger than my age.  2    I feel independent.  4    I feel energetic.  3    I live an active life.   3    I feel strong.  3    I feel healthy.  2    I feel active as other people my age.  2      How fit and strong are you.   Fit and Strong Total Score  43      Past Medical History:  Diagnosis Date  . Back pain   . Chronic sinusitis    s/p 2 surgeries remotely, [name]  . Closed fracture of metatarsal of left foot    L foot, fifth metatarsal  . Ectopic pregnancy   .  Endometrial cancer ([place]) 2004   Early diagnosis  . GERD (gastroesophageal reflux disease)   . Hyperlipemia   . Hypertension   . IBS (irritable bowel syndrome) 08/13/2014   Dx 2015, diarrhea on-off, Dr [name]   . Lumbar radiculopathy 08/2012   MRI in 08/2012  . Mild depression ([place])    Pt's son has Down's Syndrome  . Mild hyperparathyroidism ([place])   . Osteopenia   . PAF (paroxysmal atrial fibrillation) ([place])   . Prediabetes   . Sciatica of right side 08/2013   Received Depomedrol 80 mg injection  . Sleep apnea 09/09/2013   Dx with OSA in 2015, by Dr. Caryl Comes, APAP   Past Surgical History:  Procedure Laterality Date  . ABDOMINAL HYSTERECTOMY    . CHOLECYSTECTOMY  ~2006  . DILATION AND CURETTAGE  OF UTERUS     after SAB  . ESOPHAGOGASTRODUODENOSCOPY  02/09/2014   Dr. [name]  . FINGER SURGERY Left    index  . NASAL SINUS SURGERY     x 2 remotely, [name]  . TONSILLECTOMY    . TOTAL ABDOMINAL HYSTERECTOMY W/ BILATERAL SALPINGOOPHORECTOMY    . TOTAL KNEE ARTHROPLASTY Right ~2008   Social History   Tobacco Use  Smoking Status Former Smoker  . Last attempt to quit: 01/06/1980  . Years since quitting: 37.5  Smokeless Tobacco Never Used  Tobacco Comment   smoked from 1970 to 1982, less than 1 ppd    Mrs. Turnbaugh and her husband will participate in the PREP together starting today x 12 weeks on Wed/Fri 11-noon.  [name] 07/25/2017, 9:48 AM

## 2017-07-26 ENCOUNTER — Encounter: Payer: Self-pay | Admitting: Internal Medicine

## 2017-07-26 ENCOUNTER — Ambulatory Visit: Payer: Medicare Other | Admitting: Internal Medicine

## 2017-07-26 VITALS — BP 120/68 | HR 61 | Ht 68.0 in | Wt 255.2 lb

## 2017-07-26 DIAGNOSIS — I493 Ventricular premature depolarization: Secondary | ICD-10-CM | POA: Diagnosis not present

## 2017-07-26 DIAGNOSIS — I480 Paroxysmal atrial fibrillation: Secondary | ICD-10-CM | POA: Diagnosis not present

## 2017-07-26 DIAGNOSIS — R001 Bradycardia, unspecified: Secondary | ICD-10-CM | POA: Diagnosis not present

## 2017-07-26 NOTE — Progress Notes (Signed)
Patient Care Team: Colon Branch, MD as PCP - General (Internal Medicine) Marius Ditch, MD as Consulting Physician (Internal Medicine) [name], MD as Consulting Physician (Cardiology) Wylene Simmer, MD as Consulting Physician (Orthopedic Surgery) Armandina Gemma, MD as Consulting Physician (General Surgery)   HPI  Veronica Bradford is a 72 y.o. female Seen in followup for AFib and now PVCs. Event recorder demonstrated 0-14% PVCs (monomorphic and  nonsustained ventricular tachycardia.  She notes no clear association with her irritable bowel or other things with her PVCs. She has eliminated caffeine from her diet. She is becoming teetotaler  PVC episodes are infrequent but recurring daily. Atrial fibrillation has been sporadic. She try taking propafenone without restoration of sinus. She did try the flecainide again which worked better and was not associated with nausea and vomiting as before.  anticoagulation with Rivaroxaban and without bleeding issues  PVCs also improved with supplemental magnesium  Date Cr Hgb Cr Mag  5/18 0.66 14.7 3.7 2.1           Echo >> normal LV function with LVH  With normal LV   DATE TEST EF   9/15 Myoview  65 % No ischemia  8/17 Echo   60-65 %          She is referred back from the A. fib clinic where she has been seen because of recurrent episodes of atrial fibrillation requiring cardioversion.  Episodes include 12/18, 7/19.  The first was associated with a rapid ventricular response having taken propafenone.  She is concerned about the ventricular rate of 200 the heart rate was not documented.  It was by her auscultation.  She is also had numerous other episodes between December and now wearing the flecainide has been temporally associated with this termination of her atrial fibrillation episodes  The episodes are associated with a great deal of lassitude some breathlessness and lightheadedness.   She is very anxious about the next  occurrence of afib and its disruption       Past Medical History:  Diagnosis Date  . Back pain   . Chronic sinusitis    s/p 2 surgeries remotely, [name]  . Closed fracture of metatarsal of left foot    L foot, fifth metatarsal  . Ectopic pregnancy   . Endometrial cancer ([place]) 2004   Early diagnosis  . GERD (gastroesophageal reflux disease)   . Hyperlipemia   . Hypertension   . IBS (irritable bowel syndrome) 08/13/2014   Dx 2015, diarrhea on-off, Dr [name]   . Lumbar radiculopathy 08/2012   MRI in 08/2012  . Mild depression ([place])    Pt's son has Down's Syndrome  . Mild hyperparathyroidism ([place])   . Osteopenia   . PAF (paroxysmal atrial fibrillation) ([place])   . Prediabetes   . Sciatica of right side 08/2013   Received Depomedrol 80 mg injection  . Sleep apnea 09/09/2013   Dx with OSA in 2015, by Dr. Caryl Comes, APAP    Past Surgical History:  Procedure Laterality Date  . ABDOMINAL HYSTERECTOMY    . CHOLECYSTECTOMY  ~2006  . DILATION AND CURETTAGE OF UTERUS     after SAB  . ESOPHAGOGASTRODUODENOSCOPY  02/09/2014   Dr. [name]  . FINGER SURGERY Left    index  . NASAL SINUS SURGERY     x 2 remotely, [name]  . TONSILLECTOMY    . TOTAL ABDOMINAL HYSTERECTOMY W/ BILATERAL SALPINGOOPHORECTOMY    . TOTAL KNEE ARTHROPLASTY  Right ~2008    Current Outpatient Medications  Medication Sig Dispense Refill  . acetaminophen-codeine (TYLENOL #3) 300-30 MG tablet Take 1 tablet by mouth daily as needed for moderate pain. 30 tablet 0  . cetirizine (ZYRTEC) 10 MG tablet Take 10 mg by mouth as needed for allergies.    . cholecalciferol (VITAMIN D) 1000 UNITS tablet Take 2,000 Units by mouth daily.     . ciprofloxacin (CIPRO) 500 MG tablet Take 500 mg by mouth as needed (prior to dental work).    . Coenzyme Q10 (CO Q 10) 10 MG CAPS Take 100 mg by mouth daily.     Marland Kitchen CRANBERRY PO Take 1 capsule by mouth as needed.     . Cyanocobalamin (VITAMIN B-12 PO) Take 1 tablet by mouth daily.    Marland Kitchen  diltiazem (CARDIZEM) 30 MG tablet Take 1 tablet every 4 hours AS NEEDED for heart rate >100 as long as blood pressure >100. 45 tablet 1  . flecainide (TAMBOCOR) 150 MG tablet Take 2 tablets (300 mg total) by mouth as needed (FOR AFIB). Pill in a pocket 30 tablet 6  . fluticasone (FLONASE) 50 MCG/ACT nasal spray Place 1 spray into both nostrils as needed for allergies or rhinitis.     . furosemide (LASIX) 20 MG tablet Take 1 tablet (20 mg total) by mouth daily as needed for fluid. 30 tablet 6  . MAGNESIUM-OXIDE PO Take 500 mg by mouth 2 (two) times daily.    . Multiple Vitamins-Minerals (MULTIVITAMIN PO) Take 1 tablet by mouth daily.    . Omega-3 Fatty Acids (FISH OIL) 1200 MG CAPS Take 1,200 mg by mouth daily.    . pantoprazole (PROTONIX) 40 MG tablet Take 40 mg by mouth daily.    . pravastatin (PRAVACHOL) 40 MG tablet Take 1 tablet (40 mg total) by mouth at bedtime. 90 tablet 1  . propafenone (RYTHMOL) 225 MG tablet Take 2 tablets by mouth daily as needed for afib 30 tablet 6  . valsartan (DIOVAN) 320 MG tablet Take 1 tablet (320 mg total) by mouth daily. (Patient taking differently: Take 160 mg by mouth 2 (two) times daily. ) 90 tablet 1  . XARELTO 20 MG TABS tablet TAKE 1 TABLET BY MOUTH  DAILY WITH SUPPER 90 tablet 2   No current facility-administered medications for this visit.     Allergies  Allergen Reactions  . Nsaids Other (See Comments)    Pt on Xarelto  . Tolmetin Other (See Comments)    Pt on Xarelto  . Zoster Vaccine Live Other (See Comments)    Sickness per Pt.   Marland Kitchen Amoxicillin Rash    Review of Systems negative except from HPI and PMH  Physical Exam BP 120/68 (BP Location: Left Arm, Patient Position: Sitting, Cuff Size: Normal)   Pulse 61   Ht '5\' 8"'$  (1.727 m)   Wt 255 lb 4 oz (115.8 kg)   BMI 38.81 kg/m  Well developed and nourished in no acute distress HENT normal Neck supple with JVP-flat Clear Regular rate and rhythm, no murmurs or gallops Abd-soft with  active BS No Clubbing cyanosis edema Skin-warm and dry A & Oriented  Grossly normal sensory and motor function  ECG demonstrates from 7/17 sinus rhythm at 56 Intervals 20/16/46 Axis left -35   right bundle branch block left axis deviation  Assessment and  Plan  Atrial fibrillation-paroxysmal   CHADS-VASc score 2 (age/gender)    Obesity  PVCs  Sinus bradycardia  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   On Anticoagulation;  No bleeding issues   She has started exercise through the afib clinic  We had a lengthy discussion regarding treatment options.  These would include ongoing PRN 1C therapy with cardioversion back-up, full-time antiarrhythmic therapy and consideration of catheter ablation  Drug options I think are reasonably limited as noted by DC-NP that is to say the conduction system disease might make the use of a 1C difficult.  Need for adjunctive AV nodal blocking agent would aggravate her bradycardia.  Hence, dofetilide would be our best option.  We reviewed potential benefits risks and need for hospitalization.  She would like to defer this at the present time and would like to meet with Dr. Greggory Brandy with whom her husband has also met to consider catheter ablation.  She is more inclined that he pursue it.  We briefly discussed benefits as well as risks.  More than 50% of 40  min was spent in counseling related to the above

## 2017-07-26 NOTE — Patient Instructions (Addendum)
Medication Instructions: - Your physician recommends that you continue on your current medications as directed. Please refer to the Current Medication list given to you today.  Labwork: - none ordered  Procedures/Testing: - none ordered  Follow-Up: - You have been referred to : Dr. [name] for consideration of a-fib ablation. You will be contacted by the [facility] office to schedule this.   Any Additional Special Instructions Will Be Listed Below (If Applicable).     If you need a refill on your cardiac medications before your next appointment, please call your pharmacy.

## 2017-08-01 NOTE — Progress Notes (Signed)
[facility] PREP Weekly Session   Patient Details  Name: Veronica Bradford MRN: [id number] Date of Birth: 1945-07-22 Age: 72 y.o. PCP: Colon Branch, MD  Vitals:   08/01/17 1415  Weight: 259 lb (117.5 kg)    [facility] Weekly seesion - 08/01/17 1400      Weekly Session   Topic Discussed  Other ways to be active    Minutes exercised this week  60 minutes cardio   cardio   Classes attended to date  1      Fun things you did since last meeting:"swimming" Things you are grateful for:"my family, another day" Nutrition celebrations:"more fruit"  [name] 08/01/2017, 2:15 PM

## 2017-08-08 ENCOUNTER — Other Ambulatory Visit (HOSPITAL_COMMUNITY): Payer: Self-pay | Admitting: Nurse Practitioner

## 2017-08-08 MED FILL — FLECAINIDE ACETATE 150 MG T: 150 | 30 days supply | Qty: 30 | Fill #1

## 2017-08-08 NOTE — Telephone Encounter (Signed)
This is a A-Fib clinic pt 

## 2017-08-08 NOTE — Progress Notes (Signed)
[facility] PREP Weekly Session   Patient Details  Name: Veronica Bradford MRN: [id number] Date of Birth: Dec 10, 1945 Age: 72 y.o. PCP: Colon Branch, MD  Vitals:   08/08/17 1525  Weight: 254 lb 4.8 oz (115.3 kg)    [facility] Weekly seesion - 08/01/17 1400      Weekly Session   Topic Discussed  Other ways to be active    Minutes exercised this week  60 minutes cardio   cardio   Classes attended to date  1      Fun things you did since last meeting:"swim" Things you are grateful for:"modern medicine, fresh fruit" Nutrition celebrations:" No salt, fresh fruit" Barriers:"afib"   [name] 08/08/2017, 3:26 PM

## 2017-08-09 ENCOUNTER — Other Ambulatory Visit: Payer: Self-pay | Admitting: Internal Medicine

## 2017-08-09 MED ORDER — VALSARTAN 320 MG PO TABS: 320.0000 mg | ORAL_TABLET | Freq: Every day | ORAL | 1 refills | Status: DC

## 2017-08-09 MED FILL — dilTIAZem HCL 30 MG TABS: 30 | 5 days supply | Qty: 30 | Fill #0

## 2017-08-15 NOTE — Progress Notes (Signed)
[facility] PREP Weekly Session   Patient Details  Name: Veronica Bradford MRN: [id number] Date of Birth: 07-23-1945 Age: 72 y.o. PCP: Colon Branch, MD  Vitals:   08/15/17 1325  Weight: 259 lb (117.5 kg)    [facility] [employer] Weekly seesion - 08/15/17 1300      Weekly Session   Topic Discussed  Restaurant Eating    Minutes exercised this week  110 minutes   35cardio/45strength/29flexibility     Fun things you did since last meeting:"swim, knitting" Things you are grateful for:"family, friends" Nutrition celebrations:"drinking more water, more veggies" Barriers:"concerns for heart issues"   [name] 08/15/2017, 1:26 PM

## 2017-08-21 DIAGNOSIS — H43813 Vitreous degeneration, bilateral: Secondary | ICD-10-CM | POA: Diagnosis not present

## 2017-08-21 DIAGNOSIS — H43392 Other vitreous opacities, left eye: Secondary | ICD-10-CM | POA: Diagnosis not present

## 2017-08-21 DIAGNOSIS — H35373 Puckering of macula, bilateral: Secondary | ICD-10-CM | POA: Diagnosis not present

## 2017-08-21 DIAGNOSIS — H3581 Retinal edema: Secondary | ICD-10-CM | POA: Diagnosis not present

## 2017-08-23 DIAGNOSIS — B351 Tinea unguium: Secondary | ICD-10-CM | POA: Diagnosis not present

## 2017-08-23 DIAGNOSIS — D225 Melanocytic nevi of trunk: Secondary | ICD-10-CM | POA: Diagnosis not present

## 2017-08-23 DIAGNOSIS — L820 Inflamed seborrheic keratosis: Secondary | ICD-10-CM | POA: Diagnosis not present

## 2017-08-23 DIAGNOSIS — Z1283 Encounter for screening for malignant neoplasm of skin: Secondary | ICD-10-CM | POA: Diagnosis not present

## 2017-08-27 ENCOUNTER — Encounter: Payer: Self-pay | Admitting: Internal Medicine

## 2017-08-27 ENCOUNTER — Ambulatory Visit: Payer: Medicare Other | Admitting: Internal Medicine

## 2017-08-27 VITALS — BP 134/68 | HR 63 | Ht 68.0 in | Wt 258.6 lb

## 2017-08-27 DIAGNOSIS — Z01812 Encounter for preprocedural laboratory examination: Secondary | ICD-10-CM

## 2017-08-27 DIAGNOSIS — G4733 Obstructive sleep apnea (adult) (pediatric): Secondary | ICD-10-CM | POA: Diagnosis not present

## 2017-08-27 DIAGNOSIS — I480 Paroxysmal atrial fibrillation: Secondary | ICD-10-CM | POA: Diagnosis not present

## 2017-08-27 NOTE — H&P (View-Only) (Signed)
Electrophysiology Office Note   Date:  08/27/2017   ID:  Veronica, Bradford 04/11/1945, MRN [id number]  PCP:  Colon Branch, MD    Primary Electrophysiologist: [name]   CC: afib   History of Present Illness: Veronica Bradford is a 72 y.o. female who presents today for electrophysiology evaluation.   She is referred by [name] for further evaluation of afib and PVCs.  She has had afib for 4-5 years.  She has done reasonably well with prn flecainide and rhythmol.  Episodes have increased in frequency and duration.  She also has PVCs.  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  She has not tried preventative AADs but only use as pill in pocket.  [name] has mentioned tikosyn as an option. During afib, she has symptoms of fatigue and tachypalpitations. Today, she denies symptoms of palpitations, chest pain, shortness of breath, orthopnea, PND, lower extremity edema, claudication, dizziness, presyncope, syncope, bleeding, or neurologic sequela. The patient is tolerating medications without difficulties and is otherwise without complaint today.    Past Medical History:  Diagnosis Date  . Back pain   . Chronic sinusitis    s/p 2 surgeries remotely, [name]  . Closed fracture of metatarsal of left foot    L foot, fifth metatarsal  . Ectopic pregnancy   . Endometrial cancer ([place]) 2004   Early diagnosis  . GERD (gastroesophageal reflux disease)   . Hyperlipemia   . Hypertension   . IBS (irritable bowel syndrome) 08/13/2014   Dx 2015, diarrhea on-off, Dr [name]   . Lumbar radiculopathy 08/2012   MRI in 08/2012  . Mild depression ([place])    Pt's son has Down's Syndrome  . Mild hyperparathyroidism ([place])   . Osteopenia   . PAF (paroxysmal atrial fibrillation) ([place])   . Prediabetes   . Sciatica of right side 08/2013   Received Depomedrol 80 mg injection  . Sleep apnea 09/09/2013   Dx with OSA in 2015, by Dr. Caryl Comes, APAP   Past Surgical History:  Procedure  Laterality Date  . ABDOMINAL HYSTERECTOMY    . CHOLECYSTECTOMY  ~2006  . DILATION AND CURETTAGE OF UTERUS     after SAB  . ESOPHAGOGASTRODUODENOSCOPY  02/09/2014   Dr. [name]  . FINGER SURGERY Left    index  . NASAL SINUS SURGERY     x 2 remotely, [name]  . TONSILLECTOMY    . TOTAL ABDOMINAL HYSTERECTOMY W/ BILATERAL SALPINGOOPHORECTOMY    . TOTAL KNEE ARTHROPLASTY Right ~2008     Current Outpatient Medications  Medication Sig Dispense Refill  . acetaminophen-codeine (TYLENOL #3) 300-30 MG tablet Take 1 tablet by mouth daily as needed for moderate pain. 30 tablet 0  . cetirizine (ZYRTEC) 10 MG tablet Take 10 mg by mouth as needed for allergies.    . cholecalciferol (VITAMIN D) 1000 UNITS tablet Take 2,000 Units by mouth daily.     . ciprofloxacin (CIPRO) 500 MG tablet Take 500 mg by mouth as needed (prior to dental work).    . Coenzyme Q10 (CO Q 10) 10 MG CAPS Take 100 mg by mouth daily.     Marland Kitchen CRANBERRY PO Take 1 capsule by mouth as needed.     . Cyanocobalamin (VITAMIN B-12 PO) Take 1 tablet by mouth daily.    Marland Kitchen diltiazem (CARDIZEM) 30 MG tablet Take 1 tablet every 4 hours AS NEEDED for heart rate >100 as long as blood pressure >100. 45  tablet 1  . diltiazem (CARDIZEM) 30 MG tablet TAKE ONE TABLET BY MOUTH EVERY 4 HOURS AS NEEDED FOR A-FIB HR > 100 AS LONG AS BP > 100. 30 tablet 2  . flecainide (TAMBOCOR) 150 MG tablet Take 2 tablets (300 mg total) by mouth as needed (FOR AFIB). Pill in a pocket 30 tablet 6  . fluticasone (FLONASE) 50 MCG/ACT nasal spray Place 1 spray into both nostrils as needed for allergies or rhinitis.     . furosemide (LASIX) 20 MG tablet Take 1 tablet (20 mg total) by mouth daily as needed for fluid. 30 tablet 6  . MAGNESIUM-OXIDE PO Take 500 mg by mouth 2 (two) times daily.    . Multiple Vitamins-Minerals (MULTIVITAMIN PO) Take 1 tablet by mouth daily.    . Omega-3 Fatty Acids (FISH OIL) 1200 MG CAPS Take 1,200 mg by mouth daily.    . pantoprazole  (PROTONIX) 40 MG tablet Take 40 mg by mouth daily.    . pravastatin (PRAVACHOL) 40 MG tablet Take 1 tablet (40 mg total) by mouth at bedtime. 90 tablet 1  . propafenone (RYTHMOL) 225 MG tablet Take 2 tablets by mouth daily as needed for afib 30 tablet 6  . valsartan (DIOVAN) 320 MG tablet Take 1 tablet (320 mg total) by mouth daily. 90 tablet 1  . XARELTO 20 MG TABS tablet TAKE 1 TABLET BY MOUTH  DAILY WITH SUPPER 90 tablet 2   No current facility-administered medications for this visit.     Allergies:   Nsaids; Tolmetin; Zoster vaccine live; and Amoxicillin   Social History:  The patient  reports that she quit smoking about 37 years ago. She has never used smokeless tobacco. She reports that she drinks alcohol. She reports that she does not use drugs.   Family History:  The patient's  family history includes Breast cancer in her mother; Colon cancer in her mother; Diabetes in her father; Head & neck cancer in her sister; Hypertension in her father and mother; Ovarian cancer in her mother.    ROS:  Please see the history of present illness.   All other systems are personally reviewed and negative.    PHYSICAL EXAM: VS:  BP 134/68   Pulse 63   Ht 5\' 8"  (1.727 m)   Wt 258 lb 9.6 oz (117.3 kg)   SpO2 99%   BMI 39.32 kg/m  , BMI Body mass index is 39.32 kg/m. GEN: Well nourished, well developed, in no acute distress  HEENT: normal  Neck: no JVD, carotid bruits, or masses Cardiac: RRR; no murmurs, rubs, or gallops,no edema  Respiratory:  clear to auscultation bilaterally, normal work of breathing GI: soft, nontender, nondistended, + BS MS: no deformity or atrophy  Skin: warm and dry  Neuro:  Strength and sensation are intact Psych: euthymic mood, full affect  EKG:  EKG is ordered today. The ekg ordered today is personally reviewed and shows sinus rhythm 63 bpm, PR 216 msec, RBB, LAHB, QRS 158 msec,  QTc 464 msec   Recent Labs: 05/23/2017: ALT 36; TSH 1.06 07/14/2017: BUN 11;  Creatinine, Ser 0.78; Hemoglobin 15.0; Magnesium 2.3; Platelets 244; Potassium 4.6; Sodium 142  personally reviewed   Lipid Panel     Component Value Date/Time   CHOL 177 03/19/2017 0926   TRIG 104.0 03/19/2017 0926   HDL 58.00 03/19/2017 0926   CHOLHDL 3 03/19/2017 0926   VLDL 20.8 03/19/2017 0926   LDLCALC 98 03/19/2017 0926   personally reviewed  Wt Readings from Last 3 Encounters:  08/27/17 258 lb 9.6 oz (117.3 kg)  08/15/17 259 lb (117.5 kg)  08/08/17 254 lb 4.8 oz (115.3 kg)      Other studies personally reviewed: Additional studies/ records that were reviewed today include: Dr Aquilla Hacker notes, AF clinic notes  Review of the above records today demonstrates: echo 08/10/15 reveals EF 60%, mild LVH, moderate LA enlargement (80mm)   ASSESSMENT AND PLAN:  1.  Paroxysmal atrial fibrillation The patient has symptomatic, recurrent paroxysmal atrial fibrillation. she has failed medical therapy with rhythmol and flecainide as "pill in pocket". Chads2vasc score is 3.  she is anticoagulated with xarelto. Therapeutic strategies for afib including medicine (tikosyn) and ablation were discussed in detail with the patient today. Risk, benefits, and alternatives to EP study and radiofrequency ablation for afib were also discussed in detail today. These risks include but are not limited to stroke, bleeding, vascular damage, tamponade, perforation, damage to the esophagus, lungs, and other structures, pulmonary vein stenosis, worsening renal function, and death. The patient understands these risk and wishes to proceed.  We will therefore proceed with catheter ablation at the next available time.  Carto, ICE, anesthesia are requested for the procedure.  Will also obtain cardiac CT prior to the procedure to exclude LAA thrombus and further evaluate atrial anatomy.  Echo is ordered today to evaluate LA size.  2. Obesity Body mass index is 39.32 kg/m. Lifestyle modification encouraged  3.  OSA She is compliant with CPAP which is followed by Dr Maxwell Caul   Current medicines are reviewed at length with the patient today.   The patient does not have concerns regarding her medicines.  The following changes were made today:  none    Signed, [name], MD  08/27/2017 9:01 AM     [facility] [address] [place] [address] [phone] (office) [phone] (fax)

## 2017-08-27 NOTE — Progress Notes (Signed)
Electrophysiology Office Note   Date:  08/27/2017   ID:  Veronica Bradford, Veronica Bradford Apr 18, 1945, MRN [id number]  PCP:  Colon Branch, MD    Primary Electrophysiologist: [name]   CC: afib   History of Present Illness: Veronica Bradford is a 72 y.o. female who presents today for electrophysiology evaluation.   She is referred by [name] for further evaluation of afib and PVCs.  She has had afib for 4-5 years.  She has done reasonably well with prn flecainide and rhythmol.  Episodes have increased in frequency and duration.  She also has PVCs.  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  She has not tried preventative AADs but only use as pill in pocket.  [name] has mentioned tikosyn as an option. During afib, she has symptoms of fatigue and tachypalpitations. Today, she denies symptoms of palpitations, chest pain, shortness of breath, orthopnea, PND, lower extremity edema, claudication, dizziness, presyncope, syncope, bleeding, or neurologic sequela. The patient is tolerating medications without difficulties and is otherwise without complaint today.    Past Medical History:  Diagnosis Date  . Back pain   . Chronic sinusitis    s/p 2 surgeries remotely, [name]  . Closed fracture of metatarsal of left foot    L foot, fifth metatarsal  . Ectopic pregnancy   . Endometrial cancer ([place]) 2004   Early diagnosis  . GERD (gastroesophageal reflux disease)   . Hyperlipemia   . Hypertension   . IBS (irritable bowel syndrome) 08/13/2014   Dx 2015, diarrhea on-off, Dr [name]   . Lumbar radiculopathy 08/2012   MRI in 08/2012  . Mild depression ([place])    Pt's son has Down's Syndrome  . Mild hyperparathyroidism ([place])   . Osteopenia   . PAF (paroxysmal atrial fibrillation) ([place])   . Prediabetes   . Sciatica of right side 08/2013   Received Depomedrol 80 mg injection  . Sleep apnea 09/09/2013   Dx with OSA in 2015, by Dr. Caryl Comes, APAP   Past Surgical History:  Procedure  Laterality Date  . ABDOMINAL HYSTERECTOMY    . CHOLECYSTECTOMY  ~2006  . DILATION AND CURETTAGE OF UTERUS     after SAB  . ESOPHAGOGASTRODUODENOSCOPY  02/09/2014   Dr. [name]  . FINGER SURGERY Left    index  . NASAL SINUS SURGERY     x 2 remotely, [name]  . TONSILLECTOMY    . TOTAL ABDOMINAL HYSTERECTOMY W/ BILATERAL SALPINGOOPHORECTOMY    . TOTAL KNEE ARTHROPLASTY Right ~2008     Current Outpatient Medications  Medication Sig Dispense Refill  . acetaminophen-codeine (TYLENOL #3) 300-30 MG tablet Take 1 tablet by mouth daily as needed for moderate pain. 30 tablet 0  . cetirizine (ZYRTEC) 10 MG tablet Take 10 mg by mouth as needed for allergies.    . cholecalciferol (VITAMIN D) 1000 UNITS tablet Take 2,000 Units by mouth daily.     . ciprofloxacin (CIPRO) 500 MG tablet Take 500 mg by mouth as needed (prior to dental work).    . Coenzyme Q10 (CO Q 10) 10 MG CAPS Take 100 mg by mouth daily.     Marland Kitchen CRANBERRY PO Take 1 capsule by mouth as needed.     . Cyanocobalamin (VITAMIN B-12 PO) Take 1 tablet by mouth daily.    Marland Kitchen diltiazem (CARDIZEM) 30 MG tablet Take 1 tablet every 4 hours AS NEEDED for heart rate >100 as long as blood pressure >100. 45  tablet 1  . diltiazem (CARDIZEM) 30 MG tablet TAKE ONE TABLET BY MOUTH EVERY 4 HOURS AS NEEDED FOR A-FIB HR > 100 AS LONG AS BP > 100. 30 tablet 2  . flecainide (TAMBOCOR) 150 MG tablet Take 2 tablets (300 mg total) by mouth as needed (FOR AFIB). Pill in a pocket 30 tablet 6  . fluticasone (FLONASE) 50 MCG/ACT nasal spray Place 1 spray into both nostrils as needed for allergies or rhinitis.     . furosemide (LASIX) 20 MG tablet Take 1 tablet (20 mg total) by mouth daily as needed for fluid. 30 tablet 6  . MAGNESIUM-OXIDE PO Take 500 mg by mouth 2 (two) times daily.    . Multiple Vitamins-Minerals (MULTIVITAMIN PO) Take 1 tablet by mouth daily.    . Omega-3 Fatty Acids (FISH OIL) 1200 MG CAPS Take 1,200 mg by mouth daily.    . pantoprazole  (PROTONIX) 40 MG tablet Take 40 mg by mouth daily.    . pravastatin (PRAVACHOL) 40 MG tablet Take 1 tablet (40 mg total) by mouth at bedtime. 90 tablet 1  . propafenone (RYTHMOL) 225 MG tablet Take 2 tablets by mouth daily as needed for afib 30 tablet 6  . valsartan (DIOVAN) 320 MG tablet Take 1 tablet (320 mg total) by mouth daily. 90 tablet 1  . XARELTO 20 MG TABS tablet TAKE 1 TABLET BY MOUTH  DAILY WITH SUPPER 90 tablet 2   No current facility-administered medications for this visit.     Allergies:   Nsaids; Tolmetin; Zoster vaccine live; and Amoxicillin   Social History:  The patient  reports that she quit smoking about 37 years ago. She has never used smokeless tobacco. She reports that she drinks alcohol. She reports that she does not use drugs.   Family History:  The patient's  family history includes Breast cancer in her mother; Colon cancer in her mother; Diabetes in her father; Head & neck cancer in her sister; Hypertension in her father and mother; Ovarian cancer in her mother.    ROS:  Please see the history of present illness.   All other systems are personally reviewed and negative.    PHYSICAL EXAM: VS:  BP 134/68   Pulse 63   Ht 5\' 8"  (1.727 m)   Wt 258 lb 9.6 oz (117.3 kg)   SpO2 99%   BMI 39.32 kg/m  , BMI Body mass index is 39.32 kg/m. GEN: Well nourished, well developed, in no acute distress  HEENT: normal  Neck: no JVD, carotid bruits, or masses Cardiac: RRR; no murmurs, rubs, or gallops,no edema  Respiratory:  clear to auscultation bilaterally, normal work of breathing GI: soft, nontender, nondistended, + BS MS: no deformity or atrophy  Skin: warm and dry  Neuro:  Strength and sensation are intact Psych: euthymic mood, full affect  EKG:  EKG is ordered today. The ekg ordered today is personally reviewed and shows sinus rhythm 63 bpm, PR 216 msec, RBB, LAHB, QRS 158 msec,  QTc 464 msec   Recent Labs: 05/23/2017: ALT 36; TSH 1.06 07/14/2017: BUN 11;  Creatinine, Ser 0.78; Hemoglobin 15.0; Magnesium 2.3; Platelets 244; Potassium 4.6; Sodium 142  personally reviewed   Lipid Panel     Component Value Date/Time   CHOL 177 03/19/2017 0926   TRIG 104.0 03/19/2017 0926   HDL 58.00 03/19/2017 0926   CHOLHDL 3 03/19/2017 0926   VLDL 20.8 03/19/2017 0926   LDLCALC 98 03/19/2017 0926   personally reviewed  Wt Readings from Last 3 Encounters:  08/27/17 258 lb 9.6 oz (117.3 kg)  08/15/17 259 lb (117.5 kg)  08/08/17 254 lb 4.8 oz (115.3 kg)      Other studies personally reviewed: Additional studies/ records that were reviewed today include: Dr Aquilla Hacker notes, AF clinic notes  Review of the above records today demonstrates: echo 08/10/15 reveals EF 60%, mild LVH, moderate LA enlargement (48mm)   ASSESSMENT AND PLAN:  1.  Paroxysmal atrial fibrillation The patient has symptomatic, recurrent paroxysmal atrial fibrillation. she has failed medical therapy with rhythmol and flecainide as "pill in pocket". Chads2vasc score is 3.  she is anticoagulated with xarelto. Therapeutic strategies for afib including medicine (tikosyn) and ablation were discussed in detail with the patient today. Risk, benefits, and alternatives to EP study and radiofrequency ablation for afib were also discussed in detail today. These risks include but are not limited to stroke, bleeding, vascular damage, tamponade, perforation, damage to the esophagus, lungs, and other structures, pulmonary vein stenosis, worsening renal function, and death. The patient understands these risk and wishes to proceed.  We will therefore proceed with catheter ablation at the next available time.  Carto, ICE, anesthesia are requested for the procedure.  Will also obtain cardiac CT prior to the procedure to exclude LAA thrombus and further evaluate atrial anatomy.  Echo is ordered today to evaluate LA size.  2. Obesity Body mass index is 39.32 kg/m. Lifestyle modification encouraged  3.  OSA She is compliant with CPAP which is followed by Dr Maxwell Caul   Current medicines are reviewed at length with the patient today.   The patient does not have concerns regarding her medicines.  The following changes were made today:  none    Signed, [name], MD  08/27/2017 9:01 AM     [facility] [address] [place] [address] [phone] (office) [phone] (fax)

## 2017-08-27 NOTE — Patient Instructions (Addendum)
Medication Instructions:  Your physician recommends that you continue on your current medications as directed. Please refer to the Current Medication list given to you today. If you need a refill on your cardiac medications before your next appointment, please call your pharmacy.  Labwork: Today pre procedure blood work:  BMP and CBC  Testing/Procedures: Your physician has requested that you have an echocardiogram. Echocardiography is a painless test that uses sound waves to create images of your heart. It provides your doctor with information about the size and shape of your heart and how well your heart's chambers and valves are working. This procedure takes approximately one hour. There are no restrictions for this procedure.  Your physician has requested that you have cardiac CT. Cardiac computed tomography (CT) is a painless test that uses an x-ray machine to take clear, detailed pictures of your heart. For further information please visit [email or web address]. Please follow instruction sheet as given.  Your physician has recommended that you have an ablation. Catheter ablation is a medical procedure used to treat some cardiac arrhythmias (irregular heartbeats). During catheter ablation, a long, thin, flexible tube is put into a blood vessel in your groin (upper thigh), or neck. This tube is called an ablation catheter. It is then guided to your heart through the blood vessel. Radio frequency waves destroy small areas of heart tissue where abnormal heartbeats may cause an arrhythmia to start. Please see the instructions below.  Follow-Up: You will follow up with Roderic Palau, NP with the Atrial fibrillation (Afib) clinic 4 weeks after your ablationon 9/3.  You will follow up with Dr. Rayann Heman 3 months after your procedure on 9/3.    CARDIAC CT INSTRUCTIONS:  Please arrive at the [facility] main entrance of [facility] [place], [place]  [phone]  Proceed to the [facility] Radiology Department (First Floor).  Please follow these instructions carefully (unless otherwise directed):  Hold all erectile dysfunction medications at least 48 hours prior to test.  On the Night Before the Test: . Drink plenty of water. . Do not consume any caffeinated/decaffeinated beverages or chocolate 12 hours prior to your test. . Do not take any antihistamines 12 hours prior to your test. . If you take Metformin do not take 24 hours prior to test. .  On the Day of the Test: . Drink plenty of water. Do not drink any water within one hour of the test. . Do not eat any food 4 hours prior to the test. . You may take your regular medications prior to the test. . HOLD Furosemide morning of the test.  After the Test: . Drink plenty of water. . After receiving IV contrast, you may experience a mild flushed feeling. This is normal. . On occasion, you may experience a mild rash up to 24 hours after the test. This is not dangerous. If this occurs, you can take Benadryl 25 mg and increase your fluid intake. . If you experience trouble breathing, this can be serious. If it is severe call 911 IMMEDIATELY. If it is mild, please call our office. . If you take any of these medications: Glipizide/Metformin, Avandament, Glucavance, please do not take 48 hours after completing test.   ABLATION INSTRUCTIONS:  Please arrive at the [facility] main entrance of [facility] hospital at: 5:30 a.m. on 09/04/17 Do not eat or drink after midnight prior to procedure On the morning of your procedure do not take any medications. Plan  for one night stay.  You will need someone to drive you home at discharge.     Cardiac Ablation Cardiac ablation is a procedure to disable (ablate) a small amount of heart tissue in very specific places. The heart has many electrical connections. Sometimes these connections are abnormal and can cause the heart to beat very  fast or irregularly. Ablating some of the problem areas can improve the heart rhythm or return it to normal. Ablation may be done for people who:  Have Wolff-Parkinson-White syndrome.  Have fast heart rhythms (tachycardia).  Have taken medicines for an abnormal heart rhythm (arrhythmia) that were not effective or caused side effects.  Have a high-risk heartbeat that may be life-threatening.  During the procedure, a small incision is made in the neck or the groin, and a long, thin, flexible tube (catheter) is inserted into the incision and moved to the heart. Small devices (electrodes) on the tip of the catheter will send out electrical currents. A type of X-ray (fluoroscopy) will be used to help guide the catheter and to provide images of the heart. Tell a health care provider about:  Any allergies you have.  All medicines you are taking, including vitamins, herbs, eye drops, creams, and over-the-counter medicines.  Any problems you or family members have had with anesthetic medicines.  Any blood disorders you have.  Any surgeries you have had.  Any medical conditions you have, such as kidney failure.  Whether you are pregnant or may be pregnant. What are the risks? Generally, this is a safe procedure. However, problems may occur, including:  Infection.  Bruising and bleeding at the catheter insertion site.  Bleeding into the chest, especially into the sac that surrounds the heart. This is a serious complication.  Stroke or blood clots.  Damage to other structures or organs.  Allergic reaction to medicines or dyes.  Need for a permanent pacemaker if the normal electrical system is damaged. A pacemaker is a small computer that sends electrical signals to the heart and helps your heart beat normally.  The procedure not being fully effective. This may not be recognized until months later. Repeat ablation procedures are sometimes required.  What happens before the  procedure?  Follow instructions from your health care provider about eating or drinking restrictions.  Ask your health care provider about: ? Changing or stopping your regular medicines. This is especially important if you are taking diabetes medicines or blood thinners. ? Taking medicines such as aspirin and ibuprofen. These medicines can thin your blood. Do not take these medicines before your procedure if your health care provider instructs you not to.  Plan to have someone take you home from the hospital or clinic.  If you will be going home right after the procedure, plan to have someone with you for 24 hours. What happens during the procedure?  To lower your risk of infection: ? Your health care team will wash or sanitize their hands. ? Your skin will be washed with soap. ? Hair may be removed from the incision area.  An IV tube will be inserted into one of your veins.  You will be given a medicine to help you relax (sedative).  The skin on your neck or groin will be numbed.  An incision will be made in your neck or your groin.  A needle will be inserted through the incision and into a large vein in your neck or groin.  A catheter will be inserted into the needle and  moved to your heart.  Dye may be injected through the catheter to help your surgeon see the area of the heart that needs treatment.  Electrical currents will be sent from the catheter to ablate heart tissue in desired areas. There are three types of energy that may be used to ablate heart tissue: ? Heat (radiofrequency energy). ? Laser energy. ? Extreme cold (cryoablation).  When the necessary tissue has been ablated, the catheter will be removed.  Pressure will be held on the catheter insertion area to prevent excessive bleeding.  A bandage (dressing) will be placed over the catheter insertion area. The procedure may vary among health care providers and hospitals. What happens after the  procedure?  Your blood pressure, heart rate, breathing rate, and blood oxygen level will be monitored until the medicines you were given have worn off.  Your catheter insertion area will be monitored for bleeding. You will need to lie still for a few hours to ensure that you do not bleed from the catheter insertion area.  Do not drive for 24 hours or as long as directed by your health care provider. Summary  Cardiac ablation is a procedure to disable (ablate) a small amount of heart tissue in very specific places. Ablating some of the problem areas can improve the heart rhythm or return it to normal.  During the procedure, electrical currents will be sent from the catheter to ablate heart tissue in desired areas. This information is not intended to replace advice given to you by your health care provider. Make sure you discuss any questions you have with your health care provider. Document Released: 05/07/2008 Document Revised: 11/08/2015 Document Reviewed: 11/08/2015 Elsevier Interactive Patient Education  [employer].

## 2017-08-28 ENCOUNTER — Ambulatory Visit (HOSPITAL_COMMUNITY)
Admission: RE | Admit: 2017-08-28 | Discharge: 2017-08-28 | Disposition: A | Discharge status: Home / Self Care | Payer: Medicare Other | Source: Ambulatory Visit | Attending: Internal Medicine | Admitting: Internal Medicine

## 2017-08-28 ENCOUNTER — Encounter (HOSPITAL_COMMUNITY): Payer: Self-pay

## 2017-08-28 ENCOUNTER — Ambulatory Visit (HOSPITAL_COMMUNITY): Admission: RE | Admit: 2017-08-28 | Payer: Medicare Other | Source: Ambulatory Visit

## 2017-08-28 DIAGNOSIS — I480 Paroxysmal atrial fibrillation: Secondary | ICD-10-CM | POA: Diagnosis not present

## 2017-08-28 DIAGNOSIS — I4891 Unspecified atrial fibrillation: Secondary | ICD-10-CM | POA: Diagnosis not present

## 2017-08-28 DIAGNOSIS — R918 Other nonspecific abnormal finding of lung field: Secondary | ICD-10-CM | POA: Diagnosis not present

## 2017-08-28 DIAGNOSIS — G4733 Obstructive sleep apnea (adult) (pediatric): Secondary | ICD-10-CM | POA: Diagnosis not present

## 2017-08-28 LAB — CBC
HEMOGLOBIN: 14.6 g/dL (ref 11.1–15.9)
Hematocrit: 43.1 % (ref 34.0–46.6)
MCH: 29.7 pg (ref 26.6–33.0)
MCHC: 33.9 g/dL (ref 31.5–35.7)
MCV: 88 fL (ref 79–97)
PLATELETS: 245 10*3/uL (ref 150–450)
RBC: 4.92 x10E6/uL (ref 3.77–5.28)
RDW: 14.8 % (ref 12.3–15.4)
WBC: 6.7 10*3/uL (ref 3.4–10.8)

## 2017-08-28 LAB — BASIC METABOLIC PANEL
BUN/Creatinine Ratio: 18 (ref 12–28)
BUN: 14 mg/dL (ref 8–27)
CO2: 26 mmol/L (ref 20–29)
Calcium: 10.6 mg/dL — ABNORMAL HIGH (ref 8.7–10.3)
Chloride: 106 mmol/L (ref 96–106)
Creatinine, Ser: 0.77 mg/dL (ref 0.57–1.00)
GFR, EST AFRICAN AMERICAN: 91 mL/min/{1.73_m2} (ref >59)
GFR, EST NON AFRICAN AMERICAN: 79 mL/min/{1.73_m2} (ref >59)
Glucose: 100 mg/dL — ABNORMAL HIGH (ref 65–99)
POTASSIUM: 4.3 mmol/L (ref 3.5–5.2)
SODIUM: 144 mmol/L (ref 134–144)

## 2017-08-28 MED ORDER — IOPAMIDOL (ISOVUE-370) INJECTION 76%
100.0000 mL | Freq: Once | INTRAVENOUS | Status: AC | PRN
  Administered 2017-08-28: 100 mL via INTRAVENOUS

## 2017-08-30 ENCOUNTER — Ambulatory Visit (HOSPITAL_COMMUNITY): Payer: Medicare Other | Attending: Cardiology

## 2017-08-30 ENCOUNTER — Other Ambulatory Visit: Payer: Self-pay

## 2017-08-30 DIAGNOSIS — G4733 Obstructive sleep apnea (adult) (pediatric): Secondary | ICD-10-CM | POA: Diagnosis not present

## 2017-08-30 DIAGNOSIS — I10 Essential (primary) hypertension: Secondary | ICD-10-CM | POA: Insufficient documentation

## 2017-08-30 DIAGNOSIS — I4891 Unspecified atrial fibrillation: Secondary | ICD-10-CM | POA: Insufficient documentation

## 2017-08-30 DIAGNOSIS — I361 Nonrheumatic tricuspid (valve) insufficiency: Secondary | ICD-10-CM | POA: Diagnosis not present

## 2017-08-30 DIAGNOSIS — I480 Paroxysmal atrial fibrillation: Secondary | ICD-10-CM

## 2017-08-30 DIAGNOSIS — E785 Hyperlipidemia, unspecified: Secondary | ICD-10-CM | POA: Insufficient documentation

## 2017-08-31 NOTE — Progress Notes (Signed)
[facility] [employer] PREP Weekly Session   Patient Details  Name: Veronica Bradford MRN: [id number] Date of Birth: June 30, 1945 Age: 72 y.o. PCP: Colon Branch, MD  Vitals:   08/22/17 1426  Weight: 261 lb (118.4 kg)    [facility] [employer] Weekly seesion - 08/31/17 1400      Weekly Session   Minutes exercised this week  240 minutes   150cardio/90strength     Things you are grateful for:"doctors"  [name] 08/31/2017, 2:27 PM

## 2017-08-31 NOTE — Progress Notes (Signed)
[facility] PREP Weekly Session   Patient Details  Name: Veronica Bradford MRN: [id number] Date of Birth: [birth year] Age: 72 y.o. PCP: Colon Branch, MD  Vitals:    [facility] [employer] Weekly seesion - 08/31/17 1453      Weekly Session   Minutes exercised this week  220 minutes   160cardio/60strength     Fun things you did since last meeting:"yard sales, knitting, lunch w/friends" Things you are grateful for:"family, medical people, figs" Nutrition celebrations for the week:"figs, reading labels" Barriers:"eating out, low salt"   [name] 08/31/2017, 2:54 PM

## 2017-09-03 NOTE — Anesthesia Preprocedure Evaluation (Addendum)
Anesthesia Evaluation  Patient identified by MRN, date of birth, ID band Patient awake    Reviewed: Allergy & Precautions, H&P , NPO status , Patient's Chart, lab work & pertinent test results  Airway Mallampati: II  TM Distance: >3 FB Neck ROM: Full    Dental no notable dental hx. (+) Teeth Intact, Dental Advisory Given   Pulmonary sleep apnea and Continuous Positive Airway Pressure Ventilation , former smoker,    Pulmonary exam normal breath sounds clear to auscultation       Cardiovascular Exercise Tolerance: Good hypertension, + dysrhythmias Atrial Fibrillation  Rhythm:Regular Rate:Normal     Neuro/Psych Depression negative neurological ROS     GI/Hepatic Neg liver ROS, GERD  Medicated and Controlled,  Endo/Other  Morbid obesity  Renal/GU negative Renal ROS  negative genitourinary   Musculoskeletal   Abdominal   Peds  Hematology negative hematology ROS (+)   Anesthesia Other Findings   Reproductive/Obstetrics negative OB ROS                            Anesthesia Physical Anesthesia Plan  ASA: III  Anesthesia Plan: General   Post-op Pain Management:    Induction: Intravenous  PONV Risk Score and Plan: 4 or greater and Ondansetron, Dexamethasone and Midazolam  Airway Management Planned: LMA and Oral ETT  Additional Equipment:   Intra-op Plan:   Post-operative Plan: Extubation in OR  Informed Consent: I have reviewed the patients History and Physical, chart, labs and discussed the procedure including the risks, benefits and alternatives for the proposed anesthesia with the patient or authorized representative who has indicated his/her understanding and acceptance.   Dental advisory given  Plan Discussed with: CRNA  Anesthesia Plan Comments:         Anesthesia Quick Evaluation

## 2017-09-04 ENCOUNTER — Other Ambulatory Visit: Payer: Self-pay

## 2017-09-04 ENCOUNTER — Encounter (HOSPITAL_COMMUNITY)
Admission: RE | Disposition: A | Discharge status: Home / Self Care | Payer: Self-pay | Source: Ambulatory Visit | Attending: Internal Medicine

## 2017-09-04 ENCOUNTER — Ambulatory Visit (HOSPITAL_COMMUNITY): Payer: Medicare Other | Admitting: Certified Registered Nurse Anesthetist

## 2017-09-04 ENCOUNTER — Encounter (HOSPITAL_COMMUNITY): Payer: Self-pay | Admitting: Certified Registered Nurse Anesthetist

## 2017-09-04 ENCOUNTER — Ambulatory Visit (HOSPITAL_COMMUNITY)
Admission: RE | Admit: 2017-09-04 | Discharge: 2017-09-05 | Disposition: A | Discharge status: Home / Self Care | Payer: Medicare Other | Source: Ambulatory Visit | Attending: Internal Medicine | Admitting: Internal Medicine

## 2017-09-04 DIAGNOSIS — K589 Irritable bowel syndrome without diarrhea: Secondary | ICD-10-CM | POA: Insufficient documentation

## 2017-09-04 DIAGNOSIS — R7303 Prediabetes: Secondary | ICD-10-CM | POA: Insufficient documentation

## 2017-09-04 DIAGNOSIS — M8580 Other specified disorders of bone density and structure, unspecified site: Secondary | ICD-10-CM | POA: Diagnosis not present

## 2017-09-04 DIAGNOSIS — K219 Gastro-esophageal reflux disease without esophagitis: Secondary | ICD-10-CM | POA: Diagnosis not present

## 2017-09-04 DIAGNOSIS — Z8041 Family history of malignant neoplasm of ovary: Secondary | ICD-10-CM | POA: Insufficient documentation

## 2017-09-04 DIAGNOSIS — G4733 Obstructive sleep apnea (adult) (pediatric): Secondary | ICD-10-CM | POA: Diagnosis not present

## 2017-09-04 DIAGNOSIS — Z8249 Family history of ischemic heart disease and other diseases of the circulatory system: Secondary | ICD-10-CM | POA: Insufficient documentation

## 2017-09-04 DIAGNOSIS — E213 Hyperparathyroidism, unspecified: Secondary | ICD-10-CM | POA: Insufficient documentation

## 2017-09-04 DIAGNOSIS — Z880 Allergy status to penicillin: Secondary | ICD-10-CM | POA: Insufficient documentation

## 2017-09-04 DIAGNOSIS — Z888 Allergy status to other drugs, medicaments and biological substances status: Secondary | ICD-10-CM | POA: Insufficient documentation

## 2017-09-04 DIAGNOSIS — Z886 Allergy status to analgesic agent status: Secondary | ICD-10-CM | POA: Diagnosis not present

## 2017-09-04 DIAGNOSIS — Z79899 Other long term (current) drug therapy: Secondary | ICD-10-CM | POA: Insufficient documentation

## 2017-09-04 DIAGNOSIS — Z87891 Personal history of nicotine dependence: Secondary | ICD-10-CM | POA: Diagnosis not present

## 2017-09-04 DIAGNOSIS — Z7901 Long term (current) use of anticoagulants: Secondary | ICD-10-CM | POA: Diagnosis not present

## 2017-09-04 DIAGNOSIS — I483 Typical atrial flutter: Secondary | ICD-10-CM | POA: Insufficient documentation

## 2017-09-04 DIAGNOSIS — I10 Essential (primary) hypertension: Secondary | ICD-10-CM | POA: Insufficient documentation

## 2017-09-04 DIAGNOSIS — Z808 Family history of malignant neoplasm of other organs or systems: Secondary | ICD-10-CM | POA: Insufficient documentation

## 2017-09-04 DIAGNOSIS — Z90722 Acquired absence of ovaries, bilateral: Secondary | ICD-10-CM | POA: Diagnosis not present

## 2017-09-04 DIAGNOSIS — I4891 Unspecified atrial fibrillation: Secondary | ICD-10-CM | POA: Diagnosis not present

## 2017-09-04 DIAGNOSIS — Z833 Family history of diabetes mellitus: Secondary | ICD-10-CM | POA: Insufficient documentation

## 2017-09-04 DIAGNOSIS — Z9049 Acquired absence of other specified parts of digestive tract: Secondary | ICD-10-CM | POA: Insufficient documentation

## 2017-09-04 DIAGNOSIS — Z96651 Presence of right artificial knee joint: Secondary | ICD-10-CM | POA: Diagnosis not present

## 2017-09-04 DIAGNOSIS — E669 Obesity, unspecified: Secondary | ICD-10-CM | POA: Diagnosis not present

## 2017-09-04 DIAGNOSIS — Z803 Family history of malignant neoplasm of breast: Secondary | ICD-10-CM | POA: Insufficient documentation

## 2017-09-04 DIAGNOSIS — Z887 Allergy status to serum and vaccine status: Secondary | ICD-10-CM | POA: Insufficient documentation

## 2017-09-04 DIAGNOSIS — E785 Hyperlipidemia, unspecified: Secondary | ICD-10-CM | POA: Diagnosis not present

## 2017-09-04 DIAGNOSIS — Z90710 Acquired absence of both cervix and uterus: Secondary | ICD-10-CM | POA: Diagnosis not present

## 2017-09-04 DIAGNOSIS — Z800 Family history of malignant neoplasm of digestive organs: Secondary | ICD-10-CM | POA: Insufficient documentation

## 2017-09-04 DIAGNOSIS — I480 Paroxysmal atrial fibrillation: Secondary | ICD-10-CM | POA: Diagnosis not present

## 2017-09-04 DIAGNOSIS — Z6839 Body mass index (BMI) 39.0-39.9, adult: Secondary | ICD-10-CM | POA: Insufficient documentation

## 2017-09-04 DIAGNOSIS — Z9889 Other specified postprocedural states: Secondary | ICD-10-CM | POA: Diagnosis not present

## 2017-09-04 HISTORY — PX: ATRIAL FIBRILLATION ABLATION: EP1191

## 2017-09-04 LAB — POCT ACTIVATED CLOTTING TIME
ACTIVATED CLOTTING TIME: 274 s
ACTIVATED CLOTTING TIME: 296 s
ACTIVATED CLOTTING TIME: 390 s
ACTIVATED CLOTTING TIME: 186 s
Activated Clotting Time: 296 seconds

## 2017-09-04 SURGERY — ATRIAL FIBRILLATION ABLATION
Anesthesia: General

## 2017-09-04 MED ORDER — SODIUM CHLORIDE 0.9 % IV SOLN: 250.0000 mL | INTRAVENOUS | Status: DC | PRN

## 2017-09-04 MED ORDER — FLECAINIDE ACETATE 100 MG PO TABS
300.0000 mg | ORAL_TABLET | Freq: Once | ORAL | Status: AC
  Administered 2017-09-04: 18:00:00 300 mg via ORAL
  Filled 2017-09-04 (×2): qty 3

## 2017-09-04 MED ORDER — MIDAZOLAM HCL 2 MG/2ML IJ SOLN
INTRAMUSCULAR | Status: AC
  Filled 2017-09-04: qty 2

## 2017-09-04 MED ORDER — LOPERAMIDE HCL 2 MG PO CAPS: 2.0000 mg | ORAL_CAPSULE | ORAL | Status: DC | PRN

## 2017-09-04 MED ORDER — DILTIAZEM HCL 30 MG PO TABS
30.0000 mg | ORAL_TABLET | Freq: Once | ORAL | Status: AC
  Administered 2017-09-04: 30 mg via ORAL
  Filled 2017-09-04: qty 1

## 2017-09-04 MED ORDER — SODIUM CHLORIDE 0.9 % IV SOLN
INTRAVENOUS | Status: DC
  Administered 2017-09-04: 06:00:00 via INTRAVENOUS

## 2017-09-04 MED ORDER — HEPARIN (PORCINE) IN NACL 1000-0.9 UT/500ML-% IV SOLN
INTRAVENOUS | Status: AC
  Filled 2017-09-04: qty 500

## 2017-09-04 MED ORDER — HEPARIN SODIUM (PORCINE) 1000 UNIT/ML IJ SOLN
INTRAMUSCULAR | Status: AC
  Filled 2017-09-04: qty 1

## 2017-09-04 MED ORDER — HEPARIN SODIUM (PORCINE) 1000 UNIT/ML IJ SOLN
INTRAMUSCULAR | Status: DC | PRN
  Administered 2017-09-04 (×2): 4000 [IU] via INTRAVENOUS
  Administered 2017-09-04: 3000 [IU] via INTRAVENOUS

## 2017-09-04 MED ORDER — PROPOFOL 10 MG/ML IV BOLUS
INTRAVENOUS | Status: AC
  Filled 2017-09-04: qty 20

## 2017-09-04 MED ORDER — SODIUM CHLORIDE 0.9% FLUSH
3.0000 mL | Freq: Two times a day (BID) | INTRAVENOUS | Status: DC
  Administered 2017-09-04: 18:00:00 3 mL via INTRAVENOUS

## 2017-09-04 MED ORDER — PHENYLEPHRINE HCL 10 MG/ML IJ SOLN
INTRAMUSCULAR | Status: DC | PRN
  Administered 2017-09-04 (×3): 80 ug via INTRAVENOUS
  Administered 2017-09-04: 120 ug via INTRAVENOUS
  Administered 2017-09-04: 80 ug via INTRAVENOUS

## 2017-09-04 MED ORDER — FENTANYL CITRATE (PF) 250 MCG/5ML IJ SOLN
INTRAMUSCULAR | Status: AC
  Filled 2017-09-04: qty 5

## 2017-09-04 MED ORDER — DILTIAZEM HCL 30 MG PO TABS
30.0000 mg | ORAL_TABLET | Freq: Four times a day (QID) | ORAL | Status: DC
  Filled 2017-09-04 (×4): qty 1

## 2017-09-04 MED ORDER — BUPIVACAINE HCL (PF) 0.25 % IJ SOLN
INTRAMUSCULAR | Status: DC | PRN
  Administered 2017-09-04: 30 mL

## 2017-09-04 MED ORDER — SUGAMMADEX SODIUM 200 MG/2ML IV SOLN
INTRAVENOUS | Status: DC | PRN
  Administered 2017-09-04: 200 mg via INTRAVENOUS

## 2017-09-04 MED ORDER — ACETAMINOPHEN 325 MG PO TABS: 650.0000 mg | ORAL_TABLET | ORAL | Status: DC | PRN

## 2017-09-04 MED ORDER — HYDROCODONE-ACETAMINOPHEN 5-325 MG PO TABS: 1.0000 | ORAL_TABLET | ORAL | Status: DC | PRN

## 2017-09-04 MED ORDER — ISOPROTERENOL HCL 0.2 MG/ML IJ SOLN
INTRAVENOUS | Status: DC | PRN
  Administered 2017-09-04: 20 ug/min via INTRAVENOUS

## 2017-09-04 MED ORDER — RIVAROXABAN 20 MG PO TABS
20.0000 mg | ORAL_TABLET | Freq: Every day | ORAL | Status: DC
  Administered 2017-09-04: 20 mg via ORAL
  Filled 2017-09-04 (×2): qty 1

## 2017-09-04 MED ORDER — HEPARIN SODIUM (PORCINE) 1000 UNIT/ML IJ SOLN
INTRAMUSCULAR | Status: DC | PRN
  Administered 2017-09-04: 12000 [IU] via INTRAVENOUS
  Administered 2017-09-04 (×2): 1000 [IU] via INTRAVENOUS

## 2017-09-04 MED ORDER — HYDROCODONE-ACETAMINOPHEN 5-325 MG PO TABS
1.0000 | ORAL_TABLET | ORAL | Status: DC | PRN
  Administered 2017-09-04: 2 via ORAL
  Filled 2017-09-04 (×2): qty 2

## 2017-09-04 MED ORDER — ISOPROTERENOL HCL 0.2 MG/ML IJ SOLN
INTRAMUSCULAR | Status: AC
  Filled 2017-09-04: qty 5

## 2017-09-04 MED ORDER — ONDANSETRON HCL 4 MG/2ML IJ SOLN
INTRAMUSCULAR | Status: DC | PRN
  Administered 2017-09-04: 4 mg via INTRAVENOUS

## 2017-09-04 MED ORDER — PROPOFOL 10 MG/ML IV BOLUS
INTRAVENOUS | Status: DC | PRN
  Administered 2017-09-04: 50 mg via INTRAVENOUS
  Administered 2017-09-04: 130 mg via INTRAVENOUS

## 2017-09-04 MED ORDER — PANTOPRAZOLE SODIUM 40 MG PO TBEC
40.0000 mg | DELAYED_RELEASE_TABLET | Freq: Every day | ORAL | Status: DC
  Administered 2017-09-04: 40 mg via ORAL
  Filled 2017-09-04: qty 1

## 2017-09-04 MED ORDER — PROTAMINE SULFATE 10 MG/ML IV SOLN
INTRAVENOUS | Status: DC | PRN
  Administered 2017-09-04: 40 mg via INTRAVENOUS

## 2017-09-04 MED ORDER — ONDANSETRON HCL 4 MG/2ML IJ SOLN: 4.0000 mg | Freq: Four times a day (QID) | INTRAMUSCULAR | Status: DC | PRN

## 2017-09-04 MED ORDER — LIDOCAINE HCL (CARDIAC) PF 100 MG/5ML IV SOSY
PREFILLED_SYRINGE | INTRAVENOUS | Status: DC | PRN
  Administered 2017-09-04: 60 mg via INTRAVENOUS

## 2017-09-04 MED ORDER — OFF THE BEAT BOOK
Freq: Once | Status: AC
  Administered 2017-09-04: 21:00:00 1
  Filled 2017-09-04: qty 1

## 2017-09-04 MED ORDER — SODIUM CHLORIDE 0.9% FLUSH: 3.0000 mL | INTRAVENOUS | Status: DC | PRN

## 2017-09-04 MED ORDER — SODIUM CHLORIDE 0.9% FLUSH: 3.0000 mL | Freq: Two times a day (BID) | INTRAVENOUS | Status: DC

## 2017-09-04 MED ORDER — EPHEDRINE SULFATE 50 MG/ML IJ SOLN
INTRAMUSCULAR | Status: DC | PRN
  Administered 2017-09-04: 5 mg via INTRAVENOUS
  Administered 2017-09-04: 10 mg via INTRAVENOUS
  Administered 2017-09-04: 5 mg via INTRAVENOUS

## 2017-09-04 MED ORDER — HEPARIN (PORCINE) IN NACL 1000-0.9 UT/500ML-% IV SOLN
INTRAVENOUS | Status: DC | PRN
  Administered 2017-09-04: 500 mL

## 2017-09-04 MED ORDER — FENTANYL CITRATE (PF) 100 MCG/2ML IJ SOLN
INTRAMUSCULAR | Status: DC | PRN
  Administered 2017-09-04 (×2): 50 ug via INTRAVENOUS
  Administered 2017-09-04: 100 ug via INTRAVENOUS
  Administered 2017-09-04: 50 ug via INTRAVENOUS

## 2017-09-04 MED ORDER — ROCURONIUM BROMIDE 100 MG/10ML IV SOLN
INTRAVENOUS | Status: DC | PRN
  Administered 2017-09-04 (×2): 50 mg via INTRAVENOUS

## 2017-09-04 MED ORDER — DEXAMETHASONE SODIUM PHOSPHATE 10 MG/ML IJ SOLN
INTRAMUSCULAR | Status: DC | PRN
  Administered 2017-09-04: 10 mg via INTRAVENOUS

## 2017-09-04 MED ORDER — BUPIVACAINE HCL (PF) 0.25 % IJ SOLN
INTRAMUSCULAR | Status: AC
  Filled 2017-09-04: qty 30

## 2017-09-04 MED ORDER — IRBESARTAN 300 MG PO TABS
300.0000 mg | ORAL_TABLET | Freq: Every day | ORAL | Status: DC
  Filled 2017-09-04: qty 1

## 2017-09-04 MED ORDER — ACETAMINOPHEN 325 MG PO TABS
650.0000 mg | ORAL_TABLET | ORAL | Status: DC | PRN
  Filled 2017-09-04: qty 2

## 2017-09-04 MED ORDER — MIDAZOLAM HCL 5 MG/5ML IJ SOLN
INTRAMUSCULAR | Status: DC | PRN
  Administered 2017-09-04: 2 mg via INTRAVENOUS

## 2017-09-04 SURGICAL SUPPLY — 18 items
BLANKET WARM UNDERBOD FULL ACC (MISCELLANEOUS) ×1 IMPLANT
CATH MAPPNG PENTARAY F 2-6-2MM (CATHETERS) IMPLANT
CATH NAVISTAR SMARTTOUCH DF (ABLATOR) ×1 IMPLANT
CATH SOUNDSTAR 3D IMAGING (CATHETERS) ×1 IMPLANT
CATH WEBSTER BI DIR CS D-F CRV (CATHETERS) ×1 IMPLANT
COVER SWIFTLINK CONNECTOR (BAG) ×1 IMPLANT
NDL BAYLIS TRANSSEPTAL 71CM (NEEDLE) IMPLANT
NEEDLE BAYLIS TRANSSEPTAL 71CM (NEEDLE) ×2 IMPLANT
PACK EP LATEX FREE (CUSTOM PROCEDURE TRAY) ×2
PACK EP LF (CUSTOM PROCEDURE TRAY) ×1 IMPLANT
PAD PRO RADIOLUCENT 2001M-C (PAD) ×2 IMPLANT
PATCH CARTO3 (PAD) ×1 IMPLANT
PENTARAY F 2-6-2MM (CATHETERS) ×2
SHEATH AVANTI 11F 11CM (SHEATH) ×1 IMPLANT
SHEATH PINNACLE 7F 10CM (SHEATH) ×2 IMPLANT
SHEATH PINNACLE 9F 10CM (SHEATH) ×1 IMPLANT
SHEATH SWARTZ TS SL2 63CM 8.5F (SHEATH) ×1 IMPLANT
TUBING SMART ABLATE COOLFLOW (TUBING) ×1 IMPLANT

## 2017-09-04 NOTE — Anesthesia Procedure Notes (Signed)
Procedure Name: Intubation Date/Time: 09/04/2017 7:55 AM Performed by: Shirlyn Goltz, CRNA Pre-anesthesia Checklist: Patient identified, Emergency Drugs available, Suction available and Patient being monitored Patient Re-evaluated:Patient Re-evaluated prior to induction Oxygen Delivery Method: Circle system utilized Preoxygenation: Pre-oxygenation with 100% oxygen Induction Type: IV induction Ventilation: Mask ventilation without difficulty Laryngoscope Size: Mac and 3 Grade View: Grade III Tube type: Oral Tube size: 7.0 mm Number of attempts: 1 Airway Equipment and Method: Stylet Placement Confirmation: ETT inserted through vocal cords under direct vision,  positive ETCO2 and breath sounds checked- equal and bilateral Secured at: 22 cm Tube secured with: Tape Dental Injury: Teeth and Oropharynx as per pre-operative assessment

## 2017-09-04 NOTE — Interval H&P Note (Signed)
History and Physical Interval Note:  09/04/2017 7:25 AM  Veronica Bradford  has presented today for surgery, with the diagnosis of afib  The various methods of treatment have been discussed with the patient and family. After consideration of risks, benefits and other options for treatment, the patient has consented to  Procedure(s): ATRIAL FIBRILLATION ABLATION (N/A) as a surgical intervention .  The patient's history has been reviewed, patient examined, no change in status, stable for surgery.  I have reviewed the patient's chart and labs.  Questions were answered to the patient's satisfaction.     [name]

## 2017-09-04 NOTE — Anesthesia Postprocedure Evaluation (Signed)
Anesthesia Post Note  Patient: Veronica Bradford  Procedure(s) Performed: ATRIAL FIBRILLATION ABLATION (N/A )     Patient location during evaluation: PACU Anesthesia Type: General Level of consciousness: awake and alert Pain management: pain level controlled Vital Signs Assessment: post-procedure vital signs reviewed and stable Respiratory status: spontaneous breathing, nonlabored ventilation and respiratory function stable Cardiovascular status: blood pressure returned to baseline and stable Postop Assessment: no apparent nausea or vomiting Anesthetic complications: no    Last Vitals:  Vitals:   09/04/17 1210 09/04/17 1215  BP: 137/65 (!) 142/70  Pulse: 78 79  Resp: 12 14  Temp:    SpO2: 98% 99%    Last Pain:  Vitals:   09/04/17 1135  TempSrc:   PainSc: 0-No pain                 Dyamon Sosinski,W. EDMOND

## 2017-09-04 NOTE — Transfer of Care (Signed)
Immediate Anesthesia Transfer of Care Note  Patient: Veronica Bradford  Procedure(s) Performed: ATRIAL FIBRILLATION ABLATION (N/A )  Patient Location: Cath Lab  Anesthesia Type:General  Level of Consciousness: awake, alert , oriented and patient cooperative  Airway & Oxygen Therapy: Patient Spontanous Breathing and Patient connected to nasal cannula oxygen  Post-op Assessment: Report given to RN and Post -op Vital signs reviewed and stable  Post vital signs: Reviewed and stable  Last Vitals:  Vitals Value Taken Time  BP 143/62 09/04/2017 11:35 AM  Temp    Pulse 88 09/04/2017 11:36 AM  Resp 8 09/04/2017 11:36 AM  SpO2 97 % 09/04/2017 11:36 AM  Vitals shown include unvalidated device data.  Last Pain:  Vitals:   09/04/17 1135  TempSrc:   PainSc: 0-No pain      Patients Stated Pain Goal: 2 (84/03/97 9536)  Complications: No apparent anesthesia complications

## 2017-09-04 NOTE — Progress Notes (Addendum)
Client states, "I think I am having a laryngospasm"; Dr Ola Spurr notified and in to see client; no new orders noted

## 2017-09-04 NOTE — Progress Notes (Signed)
Site area: Right groin a 7, 9, and 11 french arterial sheath was removed  Site Prior to Removal:  Level 0  Pressure Applied For 20 MINUTES    Bedrest Beginning at 1215p  Manual:   Yes.    Patient Status During Pull:  stable  Post Pull Groin Site:  Level 0  Post Pull Instructions Given:  Yes.    Post Pull Pulses Present:  Yes.    Dressing Applied:  Yes.    Comments:  VS remain stable

## 2017-09-04 NOTE — Progress Notes (Signed)
Doing well post operatively She has had nonsustained episodes of afib as well as ectopic atrial tachycardia, secondary to ablation. She is quite anxious about this.  We will observe overnight with anticipated discharge in AM  [name] MD, [facility] 09/04/2017 3:38 PM

## 2017-09-04 NOTE — Progress Notes (Signed)
Client c/o feeling like heart is racing, cardiac monitor shows increased heart rate and Dr Rayann Heman notified and order noted

## 2017-09-04 NOTE — Progress Notes (Signed)
Client cont c/o headache and states wants me to wait to call Dr about cont headache

## 2017-09-04 NOTE — Progress Notes (Signed)
Report called and client transferred via stretcher and cardiac monitor to 6-C-08

## 2017-09-05 ENCOUNTER — Encounter (HOSPITAL_COMMUNITY): Payer: Self-pay | Admitting: Internal Medicine

## 2017-09-05 DIAGNOSIS — Z79899 Other long term (current) drug therapy: Secondary | ICD-10-CM | POA: Diagnosis not present

## 2017-09-05 DIAGNOSIS — Z9889 Other specified postprocedural states: Secondary | ICD-10-CM | POA: Diagnosis not present

## 2017-09-05 DIAGNOSIS — I480 Paroxysmal atrial fibrillation: Secondary | ICD-10-CM | POA: Diagnosis not present

## 2017-09-05 DIAGNOSIS — I10 Essential (primary) hypertension: Secondary | ICD-10-CM | POA: Diagnosis not present

## 2017-09-05 DIAGNOSIS — Z87891 Personal history of nicotine dependence: Secondary | ICD-10-CM | POA: Diagnosis not present

## 2017-09-05 DIAGNOSIS — G4733 Obstructive sleep apnea (adult) (pediatric): Secondary | ICD-10-CM | POA: Diagnosis not present

## 2017-09-05 DIAGNOSIS — Z886 Allergy status to analgesic agent status: Secondary | ICD-10-CM | POA: Diagnosis not present

## 2017-09-05 DIAGNOSIS — Z880 Allergy status to penicillin: Secondary | ICD-10-CM | POA: Diagnosis not present

## 2017-09-05 DIAGNOSIS — K589 Irritable bowel syndrome without diarrhea: Secondary | ICD-10-CM | POA: Diagnosis not present

## 2017-09-05 DIAGNOSIS — E785 Hyperlipidemia, unspecified: Secondary | ICD-10-CM | POA: Diagnosis not present

## 2017-09-05 DIAGNOSIS — Z887 Allergy status to serum and vaccine status: Secondary | ICD-10-CM | POA: Diagnosis not present

## 2017-09-05 DIAGNOSIS — Z888 Allergy status to other drugs, medicaments and biological substances status: Secondary | ICD-10-CM | POA: Diagnosis not present

## 2017-09-05 DIAGNOSIS — Z96651 Presence of right artificial knee joint: Secondary | ICD-10-CM | POA: Diagnosis not present

## 2017-09-05 DIAGNOSIS — K219 Gastro-esophageal reflux disease without esophagitis: Secondary | ICD-10-CM | POA: Diagnosis not present

## 2017-09-05 DIAGNOSIS — I483 Typical atrial flutter: Secondary | ICD-10-CM | POA: Diagnosis not present

## 2017-09-05 DIAGNOSIS — R7303 Prediabetes: Secondary | ICD-10-CM | POA: Diagnosis not present

## 2017-09-05 DIAGNOSIS — E213 Hyperparathyroidism, unspecified: Secondary | ICD-10-CM | POA: Diagnosis not present

## 2017-09-05 DIAGNOSIS — Z9049 Acquired absence of other specified parts of digestive tract: Secondary | ICD-10-CM | POA: Diagnosis not present

## 2017-09-05 DIAGNOSIS — M8580 Other specified disorders of bone density and structure, unspecified site: Secondary | ICD-10-CM | POA: Diagnosis not present

## 2017-09-05 DIAGNOSIS — Z7901 Long term (current) use of anticoagulants: Secondary | ICD-10-CM | POA: Diagnosis not present

## 2017-09-05 NOTE — Discharge Instructions (Signed)
Post procedure care/activity instructions No driving for 4 days. No lifting over 5 lbs for 1 week. No vigorous or sexual activity for 1 week. You may return to work in one week. Keep procedure site clean & dry. If you notice increased pain, swelling, bleeding or pus, call/return!  You may shower, but no soaking baths/hot tubs/pools for 1 week.     You have an appointment set up with the [facility].  Multiple studies have shown that being followed by a dedicated atrial fibrillation clinic in addition to the standard care you receive from your other physicians improves health. We believe that enrollment in the atrial fibrillation clinic will allow [place] to better care for you.   The phone number to the [facility] is [phone]. The clinic is staffed Monday through Friday from 8:30am to 5pm.  Parking Directions: The clinic is located in the Heart and Vascular Building connected to [facility]. 1)From [address] turn on to [address] and go to the 3rd entrance  (Heart and Vascular entrance) on the right. 2)Look to the right for Heart &Vascular Parking Garage. 3)A code for the entrance is required please call the clinic to receive this.   4)Take the elevators to the 1st floor. Registration is in the room with the glass walls at the end of the hallway.  If you have any trouble parking or locating the clinic, please dont hesitate to call [phone].

## 2017-09-05 NOTE — Discharge Summary (Addendum)
ELECTROPHYSIOLOGY PROCEDURE DISCHARGE SUMMARY    Patient ID: Veronica Bradford,  MRN: [id number], DOB/AGE: Feb 14, 1945 72 y.o.  Admit date: 09/04/2017 Discharge date: 09/05/2017  Primary Care Physician: Colon Branch, MD  Electrophysiologist: Dr. Caryl Comes  Primary Discharge Diagnosis:  1. Paroxysmal AFib     CHA2DS2Vasc is 3, on xarelto 2. AFlutter (typical)  Secondary Discharge Diagnosis:  1. HTN 2. OSA 3. Obesity  Procedures This Admission:  1.  Electrophysiology study and radiofrequency catheter ablation on 09/04/17 by Dr [name].  This study demonstrated   CONCLUSIONS: 1. Sinus rhythm upon presentation.   2. Intracardiac echo reveals a moderate sized left atrium with four separate pulmonary veins without evidence of pulmonary vein stenosis. 3. Successful electrical isolation and anatomical encircling of all four pulmonary veins with radiofrequency current.    4. Cavo-tricuspid isthmus ablation was performed with complete bidirectional isthmus block achieved.  5. No inducible arrhythmias following ablation both on and off of Isuprel 6. No early apparent complications.   Brief HPI: Veronica Bradford is a 72 y.o. female with a history of paroxysmal atrial fibrillation.  She has failed medical therapy with flecainide and propafenone (pill in the pocket). Risks, benefits, and alternatives to catheter ablation of atrial fibrillation were reviewed with the patient who wished to proceed.  The patient underwent cardiac CT prior to the procedure which demonstrated no LAA thrombus.    Hospital Course:  The patient was admitted and underwent EPS/RFCA of atrial fibrillation with details as outlined above.  They were monitored on telemetry overnight which demonstrated SR/SB generally 50's-60's.   R groin was without complication on the day of discharge.  The patient feels well this morning, no CP or SOB, she was examined by Dr. Rayann Heman and considered to be stable for discharge.  Wound care and  restrictions were reviewed with the patient.  The patient will be seen back by Roderic Palau, NP in 4 weeks and Dr Rayann Heman in 12 weeks for post ablation follow up.     Physical Exam: Vitals:   09/04/17 1954 09/04/17 2000 09/05/17 0439 09/05/17 0707  BP: 111/74  (!) 100/40 (!) 112/47  Pulse: 64  (!) 53 (!) 52  Resp: 19 (!) 25 13 15   Temp: 98 F (36.7 C)  97.7 F (36.5 C) 98 F (36.7 C)  TempSrc: Oral  Oral Oral  SpO2: 99%  99% 97%  Weight:   116 kg   Height:        GEN- The patient is well appearing, alert and oriented x 3 today.   HEENT: normocephalic, atraumatic; sclera clear, conjunctiva pink; hearing intact; oropharynx clear; neck supple  Lungs- CTA b/l, normal work of breathing.  No wheezes, rales, rhonchi Heart-  RRR, no murmurs, rubs or gallops  GI- soft, non-tender, non-distended  Extremities- no clubbing, cyanosis, or edema; R DP/PT pulses 2+ bilaterally,  R groin without hematoma/bruit MS- no significant deformity or atrophy Skin- warm and dry, no rash or lesion Psych- euthymic mood, full affect Neuro- strength and sensation are intact   Labs:   Lab Results  Component Value Date   WBC 6.7 08/27/2017   HGB 14.6 08/27/2017   HCT 43.1 08/27/2017   MCV 88 08/27/2017   PLT 245 08/27/2017   No results for input(s): NA, K, CL, CO2, BUN, CREATININE, CALCIUM, PROT, BILITOT, ALKPHOS, ALT, AST, GLUCOSE in the last 168 hours.  Invalid input(s): LABALBU   Discharge Medications:  Allergies as of 09/05/2017  Reactions   Nsaids Other (See Comments)   Pt on Xarelto   Penicillins    Local reaction   Tolmetin Other (See Comments)   Pt on Xarelto   Zoster Vaccine Live Other (See Comments)   Sickness per Pt.    Amoxicillin Rash      Medication List    TAKE these medications   acetaminophen-codeine 300-30 MG tablet Commonly known as:  TYLENOL #3 Take 1 tablet by mouth daily as needed for moderate pain. What changed:  how much to take   cetirizine 10 MG  tablet Commonly known as:  ZYRTEC Take 10 mg by mouth daily as needed for allergies.   Chromium Picolinate 1000 MCG Tabs Take 1,000 mcg by mouth every evening.   clindamycin 300 MG capsule Commonly known as:  CLEOCIN Take 600 mg by mouth See admin instructions. Take 600 mg by mouth prior to dental appointment   Co Q 10 100 MG Caps Take 100 mg by mouth daily.   diltiazem 30 MG tablet Commonly known as:  CARDIZEM Take 1 tablet every 4 hours AS NEEDED for heart rate >100 as long as blood pressure >100. What changed:    how much to take  how to take this  when to take this  reasons to take this  additional instructions   Fish Oil 1000 MG Caps Take 1,000 mg by mouth daily.   flecainide 150 MG tablet Commonly known as:  TAMBOCOR Take 2 tablets (300 mg total) by mouth as needed (FOR AFIB). Pill in a pocket What changed:    when to take this  reasons to take this  additional instructions   fluticasone 50 MCG/ACT nasal spray Commonly known as:  FLONASE Place 1 spray into both nostrils daily.   furosemide 20 MG tablet Commonly known as:  LASIX Take 1 tablet (20 mg total) by mouth daily as needed for fluid.   loperamide 2 MG capsule Commonly known as:  IMODIUM Take 2-4 mg by mouth as needed for diarrhea or loose stools.   MAGNESIUM-OXIDE PO Take 500 mg by mouth 2 (two) times daily.   MULTIVITAMIN PO Take 1 tablet by mouth daily.   pantoprazole 40 MG tablet Commonly known as:  PROTONIX Take 40 mg by mouth daily.   pravastatin 40 MG tablet Commonly known as:  PRAVACHOL Take 1 tablet (40 mg total) by mouth at bedtime.   propafenone 225 MG tablet Commonly known as:  RYTHMOL Take 2 tablets by mouth daily as needed for afib   TERBINAFINE EX Apply 1 application topically at bedtime. Terbinafine 1000 mg DMSO Compound Toe Nail Polish   valsartan 320 MG tablet Commonly known as:  DIOVAN Take 1 tablet (320 mg total) by mouth daily. What changed:    how much  to take  when to take this   vitamin B-12 1000 MCG tablet Commonly known as:  CYANOCOBALAMIN Take 2,000 mcg by mouth every evening.   Vitamin D 2000 units Caps Take 2,000 Units by mouth daily.   XARELTO 20 MG Tabs tablet Generic drug:  rivaroxaban TAKE 1 TABLET BY MOUTH  DAILY WITH SUPPER What changed:  See the new instructions.       Disposition:  Home  Discharge Instructions    Diet - low sodium heart healthy   Complete by:  As directed    Increase activity slowly   Complete by:  As directed      Follow-up Information    [facility] Follow  up on 10/02/2017.   Specialty:  Cardiology Why:  9:30AM Contact information: [address] [id number] [place] [phone]       [name], MD Follow up on 12/13/2017.   Specialty:  Cardiology Why:  9:30AM Contact information: [place] [place] [address] [phone]           Duration of Discharge Encounter: Greater than 30 minutes including physician time.  SignedTommye Standard, PA-C 09/05/2017 8:49 AM   I have seen, examined the patient, and reviewed the ab2ove assessment and plan.  Changes to above are made where necessary.  On exam, RRR.  Doing well s/p ablation.  Routine follow-up.  Co Sign: [name], MD 09/05/2017 9:56 PM

## 2017-09-06 ENCOUNTER — Telehealth: Payer: Self-pay

## 2017-09-06 NOTE — Telephone Encounter (Signed)
Patient to follow up with Cardiology

## 2017-09-10 ENCOUNTER — Telehealth (HOSPITAL_COMMUNITY): Payer: Self-pay | Admitting: *Deleted

## 2017-09-10 LAB — POCT I-STAT CREATININE: CREATININE: 0.6 mg/dL (ref 0.44–1.00)

## 2017-09-10 MED ORDER — METOPROLOL SUCCINATE ER 25 MG PO TB24: 12.5000 mg | ORAL_TABLET | Freq: Every day | ORAL | 2 refills | Status: DC

## 2017-09-10 MED FILL — METOPROLOL SUCCINATE ER 25: 25 | 30 days supply | Qty: 15 | Fill #0

## 2017-09-10 NOTE — Telephone Encounter (Signed)
Patient called in stating since ablation she is having an increase in PVCs. Most of the time it self-terminates fairly quickly but it definitely an uptick in the frequency. 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 Never experienced that before - Roderic Palau NP instructed if returns to see eye physician. Patient states she has not missed any doses of her xarelto. Discussed above with Butch Penny - she would like to start low dose metoprolol succinate 12.5mg  at bedtime (BP 130/54 HR 63 up to 90 with pvcs). Patient states she will monitor her PVCs today and decide if she actually wants to start the metoprolol or not but asked that I go ahead and call in RX.

## 2017-09-13 ENCOUNTER — Telehealth (HOSPITAL_COMMUNITY): Payer: Self-pay | Admitting: *Deleted

## 2017-09-13 ENCOUNTER — Ambulatory Visit (HOSPITAL_COMMUNITY): Payer: Medicare Other | Admitting: Nurse Practitioner

## 2017-09-13 MED ORDER — VALSARTAN 320 MG PO TABS: 160.0000 mg | ORAL_TABLET | Freq: Every day | ORAL | 1 refills | Status: DC

## 2017-09-13 NOTE — Telephone Encounter (Signed)
Patient called in stating since starting metoprolol she has had a normal rhythm but her bp has been lower. Discussed with Roderic Palau NP - will decrease diovan to 160mg  in the AM only and continue metoprolol 1/2 tab at bedtime. Pt in agreement.

## 2017-09-21 NOTE — Progress Notes (Signed)
[facility] [employer] PREP Weekly Session   Patient Details  Name: Veronica Bradford MRN: [id number] Date of Birth: [birth year] Age: 72 y.o. PCP: Colon Branch, MD  Vitals:   09/19/17 1450  Weight: 263 lb (119.3 kg)    [facility] [employer] Weekly seesion - 09/21/17 1400      Weekly Session   Topic Discussed  --   portions   Minutes exercised this week  40 minutes      Fun things you did since last meeting:"went to [employer] music" Nutrition celebrations:"figs"   [name] 09/21/2017, 2:51 PM

## 2017-09-27 ENCOUNTER — Other Ambulatory Visit: Payer: Self-pay | Admitting: Surgery

## 2017-09-27 DIAGNOSIS — E349 Endocrine disorder, unspecified: Secondary | ICD-10-CM

## 2017-09-27 DIAGNOSIS — E210 Primary hyperparathyroidism: Secondary | ICD-10-CM

## 2017-09-27 DIAGNOSIS — E041 Nontoxic single thyroid nodule: Secondary | ICD-10-CM

## 2017-10-01 ENCOUNTER — Ambulatory Visit
Admission: RE | Admit: 2017-10-01 | Discharge: 2017-10-01 | Disposition: A | Discharge status: Home / Self Care | Payer: Medicare Other | Source: Ambulatory Visit | Attending: Surgery | Admitting: Surgery

## 2017-10-01 ENCOUNTER — Telehealth: Payer: Self-pay | Admitting: Internal Medicine

## 2017-10-01 DIAGNOSIS — E349 Endocrine disorder, unspecified: Secondary | ICD-10-CM

## 2017-10-01 DIAGNOSIS — E210 Primary hyperparathyroidism: Secondary | ICD-10-CM

## 2017-10-01 DIAGNOSIS — E041 Nontoxic single thyroid nodule: Secondary | ICD-10-CM

## 2017-10-01 MED ORDER — ACETAMINOPHEN-CODEINE #3 300-30 MG PO TABS: 1.0000 | ORAL_TABLET | Freq: Every day | ORAL | 0 refills | Status: DC | PRN

## 2017-10-01 MED FILL — ACETAMINOPHEN/COD #3 TABLET: 300-30 | 30 days supply | Qty: 30 | Fill #0

## 2017-10-01 NOTE — Telephone Encounter (Signed)
Tylenol # 3 refill Last Refill:05/24/27 # 30 with 0 refill Last OV: 05/23/17 PCP: Dr. [name] Pharmacy:[facility]

## 2017-10-01 NOTE — Progress Notes (Signed)
[facility] PRE[facility] Weekly Session   Patient Details  Name: Veronica Bradford MRN: [id number] Date of Birth: 11-Nov-1945 Age: 72 y.o. PCP: Colon Branch, MD  Vitals:   09/26/17 1459  Weight: 261 lb 6.4 oz (118.6 kg)    [facility] [employer] Weekly seesion - 10/01/17 1400      Weekly Session   Minutes exercised this week  180 minutes   120cardio/30strength/62flexibility     Fun things you did since last meeting:"Park festival" Things you are grateful for:"being here" Barriers:"Heart, med issues"  [name] 10/01/2017, 2:59 PM

## 2017-10-01 NOTE — Telephone Encounter (Signed)
Copied from [place] [phone]. Topic: Quick Communication - Rx Refill/Question >> Oct 01, 2017  9:54 AM Blase Mess A wrote: Medication: acetaminophen-codeine (TYLENOL #3) 300-30 MG tablet [[id number]]  Has the patient contacted their pharmacy? Yes  (Agent: If no, request that the patient contact the pharmacy for the refill.) (Agent: If yes, when and what did the pharmacy advise?)  Preferred Pharmacy (with phone number or street name): [place], [place] [address] Phone: [phone] Fax: [phone]    Agent: Please be advised that RX refills may take up to 3 business days. We ask that you follow-up with your pharmacy.

## 2017-10-01 NOTE — Telephone Encounter (Signed)
NCCR printed- no discrepancies noted- sent for scanning.  

## 2017-10-01 NOTE — Telephone Encounter (Signed)
rx sent

## 2017-10-02 ENCOUNTER — Ambulatory Visit (HOSPITAL_COMMUNITY)
Admission: RE | Admit: 2017-10-02 | Discharge: 2017-10-02 | Disposition: A | Discharge status: Home / Self Care | Payer: Medicare Other | Source: Ambulatory Visit | Attending: Nurse Practitioner | Admitting: Nurse Practitioner

## 2017-10-02 ENCOUNTER — Encounter (HOSPITAL_COMMUNITY): Payer: Self-pay | Admitting: Nurse Practitioner

## 2017-10-02 VITALS — BP 122/62 | HR 50 | Ht 68.0 in | Wt 255.0 lb

## 2017-10-02 DIAGNOSIS — Z880 Allergy status to penicillin: Secondary | ICD-10-CM | POA: Diagnosis not present

## 2017-10-02 DIAGNOSIS — Z8249 Family history of ischemic heart disease and other diseases of the circulatory system: Secondary | ICD-10-CM | POA: Diagnosis not present

## 2017-10-02 DIAGNOSIS — I480 Paroxysmal atrial fibrillation: Secondary | ICD-10-CM | POA: Insufficient documentation

## 2017-10-02 DIAGNOSIS — Z8542 Personal history of malignant neoplasm of other parts of uterus: Secondary | ICD-10-CM | POA: Insufficient documentation

## 2017-10-02 DIAGNOSIS — R7303 Prediabetes: Secondary | ICD-10-CM | POA: Insufficient documentation

## 2017-10-02 DIAGNOSIS — I10 Essential (primary) hypertension: Secondary | ICD-10-CM | POA: Diagnosis not present

## 2017-10-02 DIAGNOSIS — Z9889 Other specified postprocedural states: Secondary | ICD-10-CM | POA: Insufficient documentation

## 2017-10-02 DIAGNOSIS — G4733 Obstructive sleep apnea (adult) (pediatric): Secondary | ICD-10-CM | POA: Insufficient documentation

## 2017-10-02 DIAGNOSIS — I4891 Unspecified atrial fibrillation: Secondary | ICD-10-CM | POA: Diagnosis present

## 2017-10-02 DIAGNOSIS — E785 Hyperlipidemia, unspecified: Secondary | ICD-10-CM | POA: Diagnosis not present

## 2017-10-02 DIAGNOSIS — K219 Gastro-esophageal reflux disease without esophagitis: Secondary | ICD-10-CM | POA: Insufficient documentation

## 2017-10-02 DIAGNOSIS — K589 Irritable bowel syndrome without diarrhea: Secondary | ICD-10-CM | POA: Diagnosis not present

## 2017-10-02 DIAGNOSIS — E210 Primary hyperparathyroidism: Secondary | ICD-10-CM | POA: Diagnosis not present

## 2017-10-02 DIAGNOSIS — Z96651 Presence of right artificial knee joint: Secondary | ICD-10-CM | POA: Diagnosis not present

## 2017-10-02 DIAGNOSIS — Z79899 Other long term (current) drug therapy: Secondary | ICD-10-CM | POA: Insufficient documentation

## 2017-10-02 DIAGNOSIS — Z9049 Acquired absence of other specified parts of digestive tract: Secondary | ICD-10-CM | POA: Diagnosis not present

## 2017-10-02 DIAGNOSIS — Z7901 Long term (current) use of anticoagulants: Secondary | ICD-10-CM | POA: Insufficient documentation

## 2017-10-02 DIAGNOSIS — E213 Hyperparathyroidism, unspecified: Secondary | ICD-10-CM | POA: Insufficient documentation

## 2017-10-02 DIAGNOSIS — M8580 Other specified disorders of bone density and structure, unspecified site: Secondary | ICD-10-CM | POA: Diagnosis not present

## 2017-10-02 DIAGNOSIS — Z886 Allergy status to analgesic agent status: Secondary | ICD-10-CM | POA: Insufficient documentation

## 2017-10-02 DIAGNOSIS — E349 Endocrine disorder, unspecified: Secondary | ICD-10-CM | POA: Diagnosis not present

## 2017-10-02 DIAGNOSIS — Z87891 Personal history of nicotine dependence: Secondary | ICD-10-CM | POA: Diagnosis not present

## 2017-10-02 MED ORDER — METOPROLOL SUCCINATE ER 25 MG PO TB24: 12.5000 mg | ORAL_TABLET | Freq: Every day | ORAL | 2 refills | Status: DC | PRN

## 2017-10-02 NOTE — Progress Notes (Signed)
Primary Care Physician: Colon Branch, MD Referring Physician: [facility] triage EP: Dr. Pat Kocher, PA-C   Veronica Bradford is a 72 y.o. female with a h/o paroxsymal afib  followed by Dr. Caryl Comes. She usually will take EITHER flecainide or propafenone to help restore SR. She is in afib clinic for 4 month f/u from last visit with [facility].  She notes that she was seen in the ER here in December with RVR around 200 mg after taking PIP Rythmol.  She did convert with cardizem IV. Sine then she has had a couple episodes of afib and has taken flecainide with conversion in 1-2 hours. She is afraid to take the rythmol again for the RVR associated with it. She always starts out with 30 mg cardizem before any antiarrythmic.She was on xarelto 20 mg daily, states no missed doses.She is currently happy with the PIP approach and does not want daily antiarrythmic.  Pt is back in afib clinic after having an urgent cardioversion in the  ER, 7/13. She developed afib and took her 300 mg flecainide x 1 and was using Cardizem 30 mg every few hours. After 23 hours and failure to convert,  with continued RVR and lightheadedness, she went to the ER and had successful cardioversion. She states the last time she had afib was 8 weeks ago and the PIP flecainide converted her. No obvious triggers. 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  F/u in afib clinic, she underwent ablation one month ago. Was noticing some increased HR's after ablation and she was started on 12.5 mg metoprolol succinate, but cut this in half with some low BP issues. She has only noted 2 short episodes of afib since the procedure . Has a resting HR in the 50's, 50 bpm today. Has noted some pounding of the heart with some pulsation in the fingertips since staring BB. Some indigestion, but improving, no groin issues.   Today, she denies symptoms of   chest pain,  shortness of breath, orthopnea, PND, lower extremity edema, dizziness, presyncope, syncope, or neurologic sequela. Positive for palpitations/ fatigue.The patient is tolerating medications without difficulties and is otherwise without complaint today.   Past Medical History:  Diagnosis Date  . Back pain   . Chronic sinusitis    s/p 2 surgeries remotely, [name]  . Closed fracture of metatarsal of left foot    L foot, fifth metatarsal  . Ectopic pregnancy   . Endometrial cancer ([place]) 2004   Early diagnosis  . GERD (gastroesophageal reflux disease)   . Hyperlipemia   . Hypertension   . IBS (irritable bowel syndrome) 08/13/2014   Dx 2015, diarrhea on-off, Dr [name]   . Lumbar radiculopathy 08/2012   MRI in 08/2012  . Mild depression ([place])    Pt's son has Down's Syndrome  . Mild hyperparathyroidism ([place] [facility])   . Osteopenia   . PAF (paroxysmal atrial fibrillation) ([place])   . Prediabetes   . Sciatica of right side 08/2013   Received Depomedrol 80 mg injection  . Sleep apnea 09/09/2013   Dx with OSA in 2015, by Dr. Caryl Comes, APAP   Past Surgical History:  Procedure Laterality Date  . ABDOMINAL HYSTERECTOMY    . ATRIAL FIBRILLATION ABLATION N/A 09/04/2017   Procedure: ATRIAL FIBRILLATION ABLATION;  Surgeon: [name], MD;  Location: [facility];  Service: Cardiovascular;  Laterality: N/A;  .  CHOLECYSTECTOMY  ~2006  . DILATION AND CURETTAGE OF UTERUS     after SAB  . ESOPHAGOGASTRODUODENOSCOPY  02/09/2014   Dr. [name]  . FINGER SURGERY Left    index  . NASAL SINUS SURGERY     x 2 remotely, [name]  . TONSILLECTOMY    . TOTAL ABDOMINAL HYSTERECTOMY W/ BILATERAL SALPINGOOPHORECTOMY    . TOTAL KNEE ARTHROPLASTY Right ~2008    Current Outpatient Medications  Medication Sig Dispense Refill  . acetaminophen-codeine (TYLENOL #3) 300-30 MG tablet Take 1 tablet by mouth daily as needed for moderate pain. 30 tablet 0  . Cholecalciferol (VITAMIN D) 2000 units CAPS Take 2,000 Units by  mouth daily.     . Chromium Picolinate 1000 MCG TABS Take 1,000 mcg by mouth every evening.    . clindamycin (CLEOCIN) 300 MG capsule Take 600 mg by mouth See admin instructions. Take 600 mg by mouth prior to dental appointment    . Coenzyme Q10 (CO Q 10) 100 MG CAPS Take 100 mg by mouth daily.     Marland Kitchen diltiazem (CARDIZEM) 30 MG tablet Take 1 tablet every 4 hours AS NEEDED for heart rate >100 as long as blood pressure >100. (Patient taking differently: Take 30 mg by mouth every 4 (four) hours as needed (for AFIB). ) 45 tablet 1  . flecainide (TAMBOCOR) 150 MG tablet Take 2 tablets (300 mg total) by mouth as needed (FOR AFIB). Pill in a pocket (Patient taking differently: Take 300 mg by mouth daily as needed (for AFIB). ) 30 tablet 6  . fluticasone (FLONASE) 50 MCG/ACT nasal spray Place 1 spray into both nostrils daily.     Marland Kitchen loperamide (IMODIUM) 2 MG capsule Take 2-4 mg by mouth as needed for diarrhea or loose stools.    Marland Kitchen MAGNESIUM-OXIDE PO Take 550 mg by mouth daily with supper.     . metoprolol succinate (TOPROL XL) 25 MG 24 hr tablet Take 0.5 tablets (12.5 mg total) by mouth daily as needed. 15 tablet 2  . Multiple Vitamins-Minerals (MULTIVITAMIN PO) Take 1 tablet by mouth daily.    . Omega-3 Fatty Acids (FISH OIL) 1000 MG CAPS Take 1,000 mg by mouth daily.     . pantoprazole (PROTONIX) 40 MG tablet Take 40 mg by mouth daily.    . pravastatin (PRAVACHOL) 40 MG tablet Take 1 tablet (40 mg total) by mouth at bedtime. 90 tablet 1  . TERBINAFINE EX Apply 1 application topically at bedtime. Terbinafine 1000 mg DMSO Compound Toe Nail Polish    . valsartan (DIOVAN) 320 MG tablet Take 0.5 tablets (160 mg total) by mouth daily. 90 tablet 1  . vitamin B-12 (CYANOCOBALAMIN) 1000 MCG tablet Take 2,000 mcg by mouth every evening.     Alveda Reasons 20 MG TABS tablet TAKE 1 TABLET BY MOUTH  DAILY WITH SUPPER (Patient taking differently: Take 20 mg by mouth daily with supper. ) 90 tablet 2  . cetirizine (ZYRTEC) 10  MG tablet Take 10 mg by mouth daily as needed for allergies.     . furosemide (LASIX) 20 MG tablet Take 1 tablet (20 mg total) by mouth daily as needed for fluid. (Patient not taking: Reported on 10/02/2017) 30 tablet 6   No current facility-administered medications for this encounter.     Allergies  Allergen Reactions  . Nsaids Other (See Comments)    Pt on Xarelto  . Penicillins     Local reaction  . Tolmetin Other (See Comments)    Pt  on Xarelto  . Zoster Vaccine Live Other (See Comments)    Sickness per Pt.   Marland Kitchen Amoxicillin Rash    Social History   Socioeconomic History  . Marital status: Married    Spouse name: Not on file  . Number of children: 1  . Years of education: Not on file  . Highest education level: Not on file  Occupational History  . 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 Financial resource strain: Not on file  . Food insecurity:    Worry: Not on file    Inability: Not on file  . Transportation needs:    Medical: Not on file    Non-medical: Not on file  Tobacco Use  . Smoking status: Former Smoker    Last attempt to quit: 01/06/1980    Years since quitting: 37.7  . Smokeless tobacco: Never Used  . Tobacco comment: smoked from 1970 to 1982, less than 1 ppd  Substance and Sexual Activity  . Alcohol use: Yes    Comment: rare  . Drug use: No  . Sexual activity: Not Currently    Birth control/protection: None  Lifestyle  . Physical activity:    Days per week: Not on file    Minutes per session: Not on file  . Stress: Not on file  Relationships  . Social connections:    Talks on phone: Not on file    Gets together: Not on file    Attends religious service: Not on file    Active member of club or organization: Not on file    Attends meetings of clubs or organizations: Not on file    Relationship status: Not on file  . Intimate partner violence:    Fear of current or ex partner: Not on file    Emotionally abused: Not on file     Physically abused: Not on file    Forced sexual activity: Not on file  Other Topics Concern  . Not on file  Social History Narrative   Lives w/ husband, and son [name]   Retired Marine scientist   Lives in [place]    Family History  Problem Relation Age of Onset  . Hypertension Mother   . Colon cancer Mother   . Breast cancer Mother   . Ovarian cancer Mother   . Hypertension Father   . Diabetes Father   . Head & neck cancer Sister   . CAD Neg Hx     ROS- All systems are reviewed and negative except as per the HPI above  Physical Exam: Vitals:   10/02/17 0921  BP: 122/62  Pulse: (!) 50  Weight: 115.7 kg  Height: 5\' 8"  (1.727 m)   Wt Readings from Last 3 Encounters:  10/02/17 115.7 kg  09/26/17 118.6 kg  09/19/17 119.3 kg    Labs: Lab Results  Component Value Date   NA 144 08/27/2017   K 4.3 08/27/2017   CL 106 08/27/2017   CO2 26 08/27/2017   GLUCOSE 100 (H) 08/27/2017   BUN 14 08/27/2017   CREATININE 0.60 08/28/2017   CALCIUM 10.6 (H) 08/27/2017   MG 2.3 07/14/2017   Lab Results  Component Value Date   INR 2.5 (H) 11/08/2006   Lab Results  Component Value Date   CHOL 177 03/19/2017   HDL 58.00 03/19/2017   LDLCALC 98 03/19/2017   TRIG 104.0 03/19/2017     GEN- The patient is well appearing, alert and oriented x 3 today.   Head-  normocephalic, atraumatic Eyes-  Sclera clear, conjunctiva pink Ears- hearing intact Oropharynx- clear Neck- supple, no JVP Lymph- no cervical lymphadenopathy Lungs- Clear to ausculation bilaterally, normal work of breathing Heart- slow regular rate and rhythm, no murmurs, rubs or gallops, PMI not laterally displaced GI- soft, NT, ND, + BS Extremities- no clubbing, cyanosis, or edema MS- no significant deformity or atrophy Skin- no rash or lesion Psych- euthymic mood, full affect Neuro- strength and sensation are intact  EKG- Sinus brady at 50 bpm, PR int 190 ms, qrs int 180 bpm, qtc 448 ms Epic records  reviewed    Assessment and Plan: 1. Paroxysmal afib  PIP flecainide failed to convert pt Successful cardioversion in ER, 7/13, but afib episodes were occurring more often Now s/p ablation one month ago Overall low afib burden Will stop BB  She is back to exercising at the gym Continue xarelto 20 mg daily for chadsvasc score of at least 3, reminded not to interrupt anticoagulation until f/u with Dr. Rayann Heman, end of healing period   2. HTN Stable  F/u with Dr. Rayann Heman  12/12   Veronica Bradford. Veronica Bradford, [facility] [address] [place], [place] [address] [phone]

## 2017-10-02 NOTE — Addendum Note (Signed)
Encounter addended by: Sherran Needs, NP on: 10/02/2017 10:50 AM  Actions taken: LOS modified

## 2017-10-03 ENCOUNTER — Ambulatory Visit (INDEPENDENT_AMBULATORY_CARE_PROVIDER_SITE_OTHER): Payer: Medicare Other

## 2017-10-03 DIAGNOSIS — Z23 Encounter for immunization: Secondary | ICD-10-CM

## 2017-10-03 NOTE — Progress Notes (Signed)
[facility] [employer] PREP Weekly Session   Patient Details  Name: [name] MRN: [id number] Date of Birth: 04/04/1945 Age: 72 y.o. PCP: Colon Branch, MD  Vitals:   10/03/17 1402  Weight: 255 lb 1.6 oz (115.7 kg)    [facility] [employer] Weekly seesion - 10/03/17 1400      Weekly Session   Topic Discussed  Water    Minutes exercised this week  120 minutes   60cardio/40strength/38flexibility     Things you are grateful for:"being alive" Nutrition celebrations:"birthday cake" Barriers:"afib x 1 nite, med changes"   [name] 10/03/2017, 2:02 PM

## 2017-10-15 NOTE — Progress Notes (Signed)
[facility] [employer] PRE[facility] Weekly Session   Patient Details  Name: MIKELL CAMP MRN: [id number] Date of Birth: [birth year] Age: 72 y.o. PCP: Colon Branch, MD  Vitals:   10/10/17 0937  Weight: 253 lb 4.8 oz (114.9 kg)    [facility] [employer] Weekly seesion - 10/15/17 0900      Weekly Session   Minutes exercised this week  120 minutes   60cardio/30strength/74flexibility     Things you are grateful for:"family, this class" Nutrition celebrations:"good choices in restaurants" Barriers:"IBS"  [name] 10/15/2017, 9:37 AM

## 2017-10-18 ENCOUNTER — Encounter (HOSPITAL_COMMUNITY): Payer: Self-pay | Admitting: Emergency Medicine

## 2017-10-18 ENCOUNTER — Emergency Department (HOSPITAL_COMMUNITY)
Admission: EM | Admit: 2017-10-18 | Discharge: 2017-10-18 | Disposition: A | Discharge status: Home / Self Care | Payer: Medicare Other | Attending: Emergency Medicine | Admitting: Emergency Medicine

## 2017-10-18 ENCOUNTER — Emergency Department (HOSPITAL_COMMUNITY): Payer: Medicare Other

## 2017-10-18 DIAGNOSIS — Z96641 Presence of right artificial hip joint: Secondary | ICD-10-CM | POA: Insufficient documentation

## 2017-10-18 DIAGNOSIS — Z87891 Personal history of nicotine dependence: Secondary | ICD-10-CM | POA: Insufficient documentation

## 2017-10-18 DIAGNOSIS — Z79899 Other long term (current) drug therapy: Secondary | ICD-10-CM | POA: Diagnosis not present

## 2017-10-18 DIAGNOSIS — I10 Essential (primary) hypertension: Secondary | ICD-10-CM | POA: Diagnosis not present

## 2017-10-18 DIAGNOSIS — J209 Acute bronchitis, unspecified: Secondary | ICD-10-CM | POA: Insufficient documentation

## 2017-10-18 DIAGNOSIS — R079 Chest pain, unspecified: Secondary | ICD-10-CM | POA: Diagnosis not present

## 2017-10-18 DIAGNOSIS — R05 Cough: Secondary | ICD-10-CM | POA: Diagnosis not present

## 2017-10-18 LAB — CBC WITH DIFFERENTIAL/PLATELET
ABS IMMATURE GRANULOCYTES: 0.04 10*3/uL (ref 0.00–0.07)
BASOS PCT: 0 %
Basophils Absolute: 0.1 10*3/uL (ref 0.0–0.1)
Eosinophils Absolute: 0.2 10*3/uL (ref 0.0–0.5)
Eosinophils Relative: 1 %
HCT: 42.2 % (ref 36.0–46.0)
Hemoglobin: 13.5 g/dL (ref 12.0–15.0)
Immature Granulocytes: 0 %
Lymphocytes Relative: 9 %
Lymphs Abs: 1.1 10*3/uL (ref 0.7–4.0)
MCH: 28.8 pg (ref 26.0–34.0)
MCHC: 32 g/dL (ref 30.0–36.0)
MCV: 90 fL (ref 80.0–100.0)
MONO ABS: 0.8 10*3/uL (ref 0.1–1.0)
Monocytes Relative: 6 %
NEUTROS ABS: 10.1 10*3/uL — AB (ref 1.7–7.7)
Neutrophils Relative %: 84 %
PLATELETS: 226 10*3/uL (ref 150–400)
RBC: 4.69 MIL/uL (ref 3.87–5.11)
RDW: 14.1 % (ref 11.5–15.5)
WBC: 12.2 10*3/uL — AB (ref 4.0–10.5)
nRBC: 0 % (ref 0.0–0.2)

## 2017-10-18 LAB — BASIC METABOLIC PANEL
ANION GAP: 7 (ref 5–15)
BUN: 6 mg/dL — ABNORMAL LOW (ref 8–23)
CALCIUM: 9.7 mg/dL (ref 8.9–10.3)
CHLORIDE: 106 mmol/L (ref 98–111)
CO2: 24 mmol/L (ref 22–32)
Creatinine, Ser: 0.67 mg/dL (ref 0.44–1.00)
GFR calc non Af Amer: >60 mL/min (ref >60)
Glucose, Bld: 115 mg/dL — ABNORMAL HIGH (ref 70–99)
POTASSIUM: 3.9 mmol/L (ref 3.5–5.1)
Sodium: 137 mmol/L (ref 135–145)

## 2017-10-18 LAB — I-STAT TROPONIN, ED: Troponin i, poc: 0.01 ng/mL (ref 0.00–0.08)

## 2017-10-18 MED ORDER — HYDROCODONE-ACETAMINOPHEN 5-325 MG PO TABS: 1.0000 | ORAL_TABLET | ORAL | 0 refills | Status: DC | PRN

## 2017-10-18 MED ORDER — IPRATROPIUM-ALBUTEROL 0.5-2.5 (3) MG/3ML IN SOLN
3.0000 mL | Freq: Once | RESPIRATORY_TRACT | Status: DC
  Filled 2017-10-18: qty 3

## 2017-10-18 MED ORDER — CEFUROXIME AXETIL 500 MG PO TABS: 500.0000 mg | ORAL_TABLET | Freq: Two times a day (BID) | ORAL | 0 refills | Status: DC

## 2017-10-18 MED ORDER — HYDROCODONE-ACETAMINOPHEN 5-325 MG PO TABS
1.0000 | ORAL_TABLET | Freq: Once | ORAL | Status: AC
  Administered 2017-10-18: 1 via ORAL
  Filled 2017-10-18: qty 1

## 2017-10-18 MED ORDER — CEFDINIR 300 MG PO CAPS
300.0000 mg | ORAL_CAPSULE | Freq: Once | ORAL | Status: AC
  Administered 2017-10-18: 300 mg via ORAL
  Filled 2017-10-18: qty 1

## 2017-10-18 NOTE — Discharge Instructions (Addendum)
Drink plenty of fluids.  Reserve hydrocodone for use at night so you can sleep.  Return if symptoms are getting worse.

## 2017-10-18 NOTE — ED Triage Notes (Addendum)
Patient reports right upper chest pain with SOB , productive cough , nasal congestion for 4 days and headache onset today , fever = 102 today , chest pain increases with coughing.

## 2017-10-18 NOTE — ED Provider Notes (Signed)
[facility] Provider Note   CSN: [id number] Arrival date & time: 10/18/17  0149     History   Chief Complaint Chief Complaint  Patient presents with  . Chest Pain    HPI [facility] is a 72 y.o. female.  The history is provided by the patient.  She has history of hypertension, hyperlipidemia, paroxysmal atrial fibrillation and comes in complaining of cold symptoms for the last 5 days.  She has had yellowish to greenish rhinorrhea and cough productive of yellowish sputum.  With the symptoms, she has developed right upper chest pain which she rates at 7/10.  Pain is worse with deep breath and cough.  There has been some dyspnea.  During the day today, she had fever up to 102.6 with associated chills.  There is been no nausea or vomiting.  Of note, she had an ablation for atrial fibrillation approximately 6 weeks ago, and has had 2 episodes of atrial fibrillation since then.  She is currently anticoagulated on rivaroxaban.  She took half of a tablet of Tylenol #3 at 1 PM without relief of cough or headache.  Past Medical History:  Diagnosis Date  . Back pain   . Chronic sinusitis    s/p 2 surgeries remotely, [name]  . Closed fracture of metatarsal of left foot    L foot, fifth metatarsal  . Ectopic pregnancy   . Endometrial cancer ([place]) 2004   Early diagnosis  . GERD (gastroesophageal reflux disease)   . Hyperlipemia   . Hypertension   . IBS (irritable bowel syndrome) 08/13/2014   Dx 2015, diarrhea on-off, Dr [name]   . Lumbar radiculopathy 08/2012   MRI in 08/2012  . Mild depression ([place])    Pt's son has Down's Syndrome  . 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 Osteopenia   . PAF (paroxysmal atrial fibrillation) ([place])   . Prediabetes   . Sciatica of right side 08/2013   Received Depomedrol 80 mg injection  . Sleep apnea 09/09/2013   Dx with OSA in 2015, by Dr. Caryl Comes, APAP    Patient Active Problem List   Diagnosis Date Noted  .  Paroxysmal atrial fibrillation ([place]) 09/04/2017  . Osteopenia 05/23/2017  . Annual physical exam 04/14/2015  . PCP NOTES >>>>> 12/23/2014  . IBS (irritable bowel syndrome) 08/13/2014  . Hyperglycemia   . Chronic sinusitis   . Endometrial cancer ([place])   . Back pain-- on tylenol #3 prn   . GERD (gastroesophageal reflux disease)   . HTN (hypertension) 09/09/2013  . Diastolic dysfunction-grade 2 09/09/2013  . Chronic anticoagulation 09/09/2013  . Edema of both legs 09/09/2013  . Dyslipidemia 09/09/2013  . Obesity (BMI 30-39.9) 09/09/2013  . Sleep apnea-- Dr Maxwell Caul, on CPAP 09/09/2013  . PAF (paroxysmal atrial fibrillation) ([place]) 06/20/2013    Past Surgical History:  Procedure Laterality Date  . ABDOMINAL HYSTERECTOMY    . ATRIAL FIBRILLATION ABLATION N/A 09/04/2017   Procedure: ATRIAL FIBRILLATION ABLATION;  Surgeon: [name], MD;  Location: [facility];  Service: Cardiovascular;  Laterality: N/A;  . CHOLECYSTECTOMY  ~2006  . DILATION AND CURETTAGE OF UTERUS     after SAB  . ESOPHAGOGASTRODUODENOSCOPY  02/09/2014   Dr. [name]  . FINGER SURGERY Left    index  . NASAL SINUS SURGERY     x 2 remotely, [name]  . TONSILLECTOMY    . TOTAL ABDOMINAL HYSTERECTOMY W/ BILATERAL SALPINGOOPHORECTOMY    . TOTAL KNEE ARTHROPLASTY Right ~  2008     OB History    Gravida  4   Para  1   Term  1   Preterm      AB  3   Living  1     SAB  2   TAB      Ectopic  1   Multiple      Live Births  1            Home Medications    Prior to Admission medications   Medication Sig Start Date End Date Taking? Authorizing Provider  acetaminophen-codeine (TYLENOL #3) 300-30 MG tablet Take 1 tablet by mouth daily as needed for moderate pain. 10/01/17   Colon Branch, MD  cetirizine (ZYRTEC) 10 MG tablet Take 10 mg by mouth daily as needed for allergies.     [provider]  Cholecalciferol (VITAMIN D) 2000 units CAPS Take 2,000 Units by mouth daily.     [provider]  Chromium Picolinate 1000 MCG TABS Take 1,000 mcg by mouth every evening.    [provider]  clindamycin (CLEOCIN) 300 MG capsule Take 600 mg by mouth See admin instructions. Take 600 mg by mouth prior to dental appointment    [provider]  Coenzyme Q10 (CO Q 10) 100 MG CAPS Take 100 mg by mouth daily.     [provider]  diltiazem (CARDIZEM) 30 MG tablet Take 1 tablet every 4 hours AS NEEDED for heart rate >100 as long as blood pressure >100. Patient taking differently: Take 30 mg by mouth every 4 (four) hours as needed (for AFIB).  01/15/17   Sherran Needs, NP  flecainide (TAMBOCOR) 150 MG tablet Take 2 tablets (300 mg total) by mouth as needed (FOR AFIB). Pill in a pocket Patient taking differently: Take 300 mg by mouth daily as needed (for AFIB).  11/27/16   Baldwin Jamaica, PA-C  fluticasone (FLONASE) 50 MCG/ACT nasal spray Place 1 spray into both nostrils daily.     [provider]  furosemide (LASIX) 20 MG tablet Take 1 tablet (20 mg total) by mouth daily as needed for fluid. Patient not taking: Reported on 10/02/2017 11/27/16   Baldwin Jamaica, PA-C  loperamide (IMODIUM) 2 MG capsule Take 2-4 mg by mouth as needed for diarrhea or loose stools.    [provider]  MAGNESIUM-OXIDE PO Take 550 mg by mouth daily with supper.     [provider]  metoprolol succinate (TOPROL XL) 25 MG 24 hr tablet Take 0.5 tablets (12.5 mg total) by mouth daily as needed. 10/02/17   Sherran Needs, NP  Multiple Vitamins-Minerals (MULTIVITAMIN PO) Take 1 tablet by mouth daily.    [provider]  Omega-3 Fatty Acids (FISH OIL) 1000 MG CAPS Take 1,000 mg by mouth daily.     [provider]  pantoprazole (PROTONIX) 40 MG tablet Take 40 mg by mouth daily.    [provider]  pravastatin (PRAVACHOL) 40 MG tablet Take 1 tablet (40 mg total) by mouth at bedtime. 08/09/17   Colon Branch, MD  TERBINAFINE EX Apply 1  application topically at bedtime. Terbinafine 1000 mg DMSO Compound Toe Nail [place]    [provider]  valsartan (DIOVAN) 320 MG tablet Take 0.5 tablets (160 mg total) by mouth daily. 09/13/17   Sherran Needs, NP  vitamin B-12 (CYANOCOBALAMIN) 1000 MCG tablet Take 2,000 mcg by mouth every evening.     [provider]  XARELTO 20 MG TABS tablet TAKE 1 TABLET BY MOUTH  DAILY WITH SUPPER Patient taking differently: Take 20 mg by mouth daily with supper.  05/16/17   [name], MD    Family History Family History  Problem Relation Age of Onset  . Hypertension Mother   . Colon cancer Mother   . Breast cancer Mother   . Ovarian cancer Mother   . Hypertension Father   . Diabetes Father   . Head & neck cancer Sister   . CAD Neg Hx     Social History Social History   Tobacco Use  . Smoking status: Former Smoker    Last attempt to quit: 01/06/1980    Years since quitting: 37.8  . Smokeless tobacco: Never Used  . Tobacco comment: smoked from 1970 to 1982, less than 1 ppd  Substance Use Topics  . Alcohol use: Yes    Comment: rare  . Drug use: No     Allergies   Nsaids; Penicillins; Tolmetin; Zoster vaccine live; and Amoxicillin   Review of Systems Review of Systems  All other systems reviewed and are negative.    Physical Exam Updated Vital Signs BP 119/77 (BP Location: Right Arm)   Pulse 82   Temp 99.5 F (37.5 C) (Oral)   Resp 18   SpO2 98%   Physical Exam  Nursing note and vitals reviewed.  72 year old female, resting comfortably and in no acute distress. Vital signs are normal. Oxygen saturation is 98%, which is normal. Head is normocephalic and atraumatic. PERRLA, EOMI. Oropharynx is clear. Neck is nontender and supple without adenopathy or JVD. Back is nontender and there is no CVA tenderness. Lungs have prolonged exhalation phase with occasional expiratory wheeze.  There are no rales or rhonchi. Chest is nontender. Heart has regular  rate and rhythm without murmur. Abdomen is soft, flat, nontender without masses or hepatosplenomegaly and peristalsis is normoactive. Extremities have 1+ edema, full range of motion is present. Skin is warm and dry without rash. Neurologic: Mental status is normal, cranial nerves are intact, there are no motor or sensory deficits.  ED Treatments / Results  Labs (all labs ordered are listed, but only abnormal results are displayed) Labs Reviewed  BASIC METABOLIC PANEL - Abnormal; Notable for the following components:      Result Value   Glucose, Bld 115 (*)    BUN 6 (*)    All other components within normal limits  CBC WITH DIFFERENTIAL/PLATELET - Abnormal; Notable for the following components:   WBC 12.2 (*)    Neutro Abs 10.1 (*)    All other components within normal limits  I-STAT TROPONIN, ED    EKG EKG Interpretation  Date/Time:  Thursday October 18 2017 01:52:02 EDT Ventricular Rate:  81 PR Interval:    QRS Duration: 154 QT Interval:  410 QTC Calculation: 476 R Axis:   -40 Text Interpretation:  Atrial fibrillation Left axis deviation Right bundle branch block When compared with ECG of 10/02/2017, HEART RATE has increased Reconfirmed by Delora Fuel ([id number]) on 10/18/2017 2:06:11 AM   Radiology Dg Chest 2 View  Result Date: 10/18/2017 CLINICAL DATA:  Cough EXAM: CHEST - 2 VIEW COMPARISON:  07/14/2017 FINDINGS: The heart size and mediastinal contours are within normal limits. Both lungs are clear. The visualized skeletal structures are unremarkable. IMPRESSION: No active cardiopulmonary disease. Electronically Signed   By: Donavan Foil M.D.   On: 10/18/2017 02:40    Procedures Procedures   Medications Ordered  in ED Medications  ipratropium-albuterol (DUONEB) 0.5-2.5 (3) MG/3ML nebulizer solution 3 mL (3 mLs Nebulization Not Given 10/18/17 0324)  cefdinir (OMNICEF) capsule 300 mg (has no administration in time range)  HYDROcodone-acetaminophen (NORCO/VICODIN) 5-325 MG  per tablet 1 tablet (1 tablet Oral Given 10/18/17 0250)     Initial Impression / Assessment and Plan / ED Course  I have reviewed the triage vital signs and the nursing notes.  Pertinent labs & imaging results that were available during my care of the patient were reviewed by me and considered in my medical decision making (see chart for details).  Respiratory tract infection with secondary headache and chest pain.  X-ray shows no evidence of pneumonia.  She is afebrile here.  We will give therapeutic trial of albuterol with ipratropium.  Unfortunately, because she is on an oral anticoagulant, cannot use NSAIDs for pain.  Old records are reviewed confirming recent hospitalization for cardiac ablation.  She refused albuterol because of concern that it might send her into atrial fibrillation.  Laboratory work-up is unremarkable.  Mild leukocytosis is present.  Suspect she started with a viral bronchitis and had bacterial superinfection.  Will treat with antibiotics.  She is allergic to penicillin, is discharged with prescription for cefuroxime.  Also given prescription for small number of hydrocodone-acetaminophen tablets to use at night to suppress cough so she can sleep.  Return precautions discussed.  Final Clinical Impressions(s) / ED Diagnoses   Final diagnoses:  Acute bronchitis, unspecified organism    ED Discharge Orders         Ordered    cefUROXime (CEFTIN) 500 MG tablet  2 times daily with meals     10/18/17 0400    HYDROcodone-acetaminophen (NORCO) 5-325 MG tablet  Every 4 hours PRN     10/18/17 3662           Delora Fuel, MD 94/76/54 [phone]

## 2017-10-18 NOTE — ED Notes (Signed)
IV attempted x2 without success, IV held at this time per MD

## 2017-10-19 ENCOUNTER — Ambulatory Visit: Payer: Self-pay

## 2017-10-19 ENCOUNTER — Encounter: Payer: Self-pay | Admitting: Internal Medicine

## 2017-10-19 ENCOUNTER — Ambulatory Visit (INDEPENDENT_AMBULATORY_CARE_PROVIDER_SITE_OTHER): Payer: Medicare Other | Admitting: Internal Medicine

## 2017-10-19 VITALS — BP 126/78 | HR 56 | Temp 98.0°F | Resp 16 | Ht 68.0 in | Wt 258.1 lb

## 2017-10-19 DIAGNOSIS — J9801 Acute bronchospasm: Secondary | ICD-10-CM

## 2017-10-19 DIAGNOSIS — J40 Bronchitis, not specified as acute or chronic: Secondary | ICD-10-CM

## 2017-10-19 MED ORDER — PREDNISONE 10 MG PO TABS: ORAL_TABLET | ORAL | 0 refills | Status: DC

## 2017-10-19 MED FILL — predniSONE 10 MG TABS: 10 | 5 days supply | Qty: 10 | Fill #0

## 2017-10-19 NOTE — Progress Notes (Signed)
Subjective:    Patient ID: [facility], female    DOB: Jul 02, 1945, 72 y.o.   MRN: [id number]  DOS:  10/19/2017 Type of visit - description : acute Interval history:  Went to the ER yesterday complaining of upper respiratory symptoms for 5 days. CBC showed a white count of 12.2.  BMP normal, EKG chest x-ray with no acute findings. Was prescribed cefdinir and hydrocodone for cough suppression. She was noted to have prolonged expiration with occasional wheeze, declined albuterol as she is afraid of going back on A. fib.   Review of Systems She is here today because she continue with symptoms Still coughing, some yellow sputum, overall cough has decreased a little. No fever today. Sinuses with minimal symptoms, using saline irrigation Continued wheezing and having DOE. Reports a headache but only w/ fevers. No nausea, vomiting, diarrhea. + Malaise and mild generalized aches.  Past Medical History:  Diagnosis Date  . Back pain   . Chronic sinusitis    s/p 2 surgeries remotely, [name]  . Closed fracture of metatarsal of left foot    L foot, fifth metatarsal  . Ectopic pregnancy   . Endometrial cancer ([place]) 2004   Early diagnosis  . GERD (gastroesophageal reflux disease)   . Hyperlipemia   . Hypertension   . IBS (irritable bowel syndrome) 08/13/2014   Dx 2015, diarrhea on-off, Dr [name]   . Lumbar radiculopathy 08/2012   MRI in 08/2012  . Mild depression ([place])    Pt's son has Down's Syndrome  . Mild hyperparathyroidism ([place])   . Osteopenia   . PAF (paroxysmal atrial fibrillation) ([place])   . Prediabetes   . Sciatica of right side 08/2013   Received Depomedrol 80 mg injection  . Sleep apnea 09/09/2013   Dx with OSA in 2015, by Dr. Caryl Comes, APAP    Past Surgical History:  Procedure Laterality Date  . ABDOMINAL HYSTERECTOMY    . ATRIAL FIBRILLATION ABLATION N/A 09/04/2017   Procedure: ATRIAL FIBRILLATION ABLATION;  Surgeon: [name], MD;  Location: [facility];   Service: Cardiovascular;  Laterality: N/A;  . CHOLECYSTECTOMY  ~2006  . DILATION AND CURETTAGE OF UTERUS     after SAB  . ESOPHAGOGASTRODUODENOSCOPY  02/09/2014   Dr. [name]  . FINGER SURGERY Left    index  . NASAL SINUS SURGERY     x 2 remotely, [name]  . TONSILLECTOMY    . TOTAL ABDOMINAL HYSTERECTOMY W/ BILATERAL SALPINGOOPHORECTOMY    . TOTAL KNEE ARTHROPLASTY Right ~2008    Social History   Socioeconomic History  . Marital status: Married    Spouse name: Not on file  . Number of children: 1  . Years of education: Not on file  . Highest education level: Not on file  Occupational History  . 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 Financial resource strain: Not on file  . Food insecurity:    Worry: Not on file    Inability: Not on file  . Transportation needs:    Medical: Not on file    Non-medical: Not on file  Tobacco Use  . Smoking status: Former Smoker    Last attempt to quit: 01/06/1980    Years since quitting: 37.8  . Smokeless tobacco: Never Used  . Tobacco comment: smoked from 1970 to 1982, less than 1 ppd  Substance and Sexual Activity  . Alcohol use: Yes    Comment: rare  . Drug use: No  . Sexual  activity: Not Currently    Birth control/protection: None  Lifestyle  . Physical activity:    Days per week: Not on file    Minutes per session: Not on file  . Stress: Not on file  Relationships  . Social connections:    Talks on phone: Not on file    Gets together: Not on file    Attends religious service: Not on file    Active member of club or organization: Not on file    Attends meetings of clubs or organizations: Not on file    Relationship status: Not on file  . Intimate partner violence:    Fear of current or ex partner: Not on file    Emotionally abused: Not on file    Physically abused: Not on file    Forced sexual activity: Not on file  Other Topics Concern  . Not on file  Social History Narrative   Lives w/ husband, and son  [name]   Retired Marine scientist   Lives in [place] as of 10/19/2017      Reactions   Nsaids Other (See Comments)   Pt on Xarelto   Penicillins    Local reaction   Tolmetin Other (See Comments)   Pt on Xarelto   Zoster Vaccine Live Other (See Comments)   Sickness per Pt.    Amoxicillin Rash      Medication List        Accurate as of 10/19/17 11:59 PM. Always use your most recent med list.          acetaminophen-codeine 300-30 MG tablet Commonly known as:  TYLENOL #3 Take 1 tablet by mouth daily as needed for moderate pain.   cefUROXime 500 MG tablet Commonly known as:  CEFTIN Take 1 tablet (500 mg total) by mouth 2 (two) times daily with a meal.   Chromium Picolinate 1000 MCG Tabs Take 1,000 mcg by mouth every evening.   Co Q 10 100 MG Caps Take 100 mg by mouth daily.   diltiazem 30 MG tablet Commonly known as:  CARDIZEM Take 1 tablet every 4 hours AS NEEDED for heart rate >100 as long as blood pressure >100.   Fish Oil 1000 MG Caps Take 1,000 mg by mouth daily.   flecainide 150 MG tablet Commonly known as:  TAMBOCOR Take 2 tablets (300 mg total) by mouth as needed (FOR AFIB). Pill in a pocket   fluticasone 50 MCG/ACT nasal spray Commonly known as:  FLONASE Place 1 spray into both nostrils daily.   HYDROcodone-acetaminophen 5-325 MG tablet Commonly known as:  NORCO/VICODIN Take 1 tablet by mouth every 4 (four) hours as needed for moderate pain (or coughing).   MAGNESIUM-OXIDE PO Take 500 mg by mouth 2 (two) times daily.   MULTIVITAMIN PO Take 1 tablet by mouth daily.   pantoprazole 40 MG tablet Commonly known as:  PROTONIX Take 40 mg by mouth daily.   pravastatin 40 MG tablet Commonly known as:  PRAVACHOL Take 1 tablet (40 mg total) by mouth at bedtime.   predniSONE 10 MG tablet Commonly known as:  DELTASONE 2 tabs a day x 5 days   valsartan 320 MG tablet Commonly known as:  DIOVAN Take 0.5 tablets (160 mg total) by mouth daily.     vitamin B-12 1000 MCG tablet Commonly known as:  CYANOCOBALAMIN Take 2,000 mcg by mouth every evening.   Vitamin D 2000 units Caps Take 2,000 Units by mouth daily.  XARELTO 20 MG Tabs tablet Generic drug:  rivaroxaban TAKE 1 TABLET BY MOUTH  DAILY WITH SUPPER          Objective:   Physical Exam BP 126/78 (BP Location: Left Wrist, Patient Position: Sitting, Cuff Size: Small)   Pulse (!) 56   Temp 98 F (36.7 C) (Oral)   Resp 16   Ht 5\' 8"  (1.727 m)   Wt 258 lb 2 oz (117.1 kg)   SpO2 96%   BMI 39.25 kg/m  General:   Well developed, NAD, see BMI.  HEENT:  Normocephalic . Face symmetric, atraumatic. TMs normal.  Throat symmetric no red.  Nose is slightly congested. Lungs:  Few rhonchi, increased expiratory time, few wheezes  Normal respiratory effort, no intercostal retractions, no accessory muscle use. Heart: RRR,  no murmur.  No pretibial edema bilaterally  Skin: Not pale. Not jaundice Neurologic:  alert & oriented X3.  Speech normal, gait appropriate for age and unassisted Psych--  Cognition and judgment appear intact.  Cooperative with normal attention span and concentration.  Behavior appropriate. No anxious or depressed appearing.      Assessment & Plan:    Assessment: Prediabetes HTN Hyperlipidemia Chronic lower extremity edema L>R (eval by previous pcp, related to varicose veins?) CV:  Paroxysmal atrial fibrillation: on xarelto; Rythmol----> pill-in-pocket PVCs -- flecainide prn Ablation 09/2017 GI: Dr [name]  --GERD (zantac or protonix prn), IBS, chronic diarrhea --GI records: [place], 2004, 2009 8 and 07-2013. Negative for polyps or IBD. + Diverticuli. Next 202 due to [place]. Had a EGD/cscope 11/2016 d -- IBS, Diarrhea OSA--- CPAP, Dr. Maxwell Caul Osteopenia:  Multiple  Dexas, last two:  T score 2009  -1.1 T score 2015  -0.7 DJD --Chronic back pain-- tylenol #3 prn --- RF per PCP --history of epidurals in the past, MRI 2014:  Scoliosis,DJD --CIPRO pre dental d/t knee replacement (rx by ortho) H/o endometrial cancer, found during a hysterectomy, no chemotherapy or XRT.  Primary hyperparathyroidism: Chronic hypercalcemia, increased PTH 05/2016, refered to ENT  PLAN:  Bronchitis, bronchospasm: No history of previous asthma, symptoms consistent with bronchitis and bronchospasm, already on antibiotics. Although she continue with symptoms she is overall slightly better.  She is reluctant to take inhalers due to history of recent A. fib, I understand her concerns.  We agreed to add prednisone and continue the other medications.  Call if not better.  See AVS.

## 2017-10-19 NOTE — Progress Notes (Signed)
Pre visit review using our clinic review tool, if applicable. No additional management support is needed unless otherwise documented below in the visit note. 

## 2017-10-19 NOTE — Patient Instructions (Signed)
Continue the antibiotic Ceftin  Continue Robitussin-DM  Continue saline irrigation in your sinuses  Hydrocodone as needed for cough  Please take prednisone as prescribed for 5 days  Call if not gradually better  ER if you feel worse  Tylenol as needed for fever

## 2017-10-19 NOTE — Telephone Encounter (Signed)
Pt. Reports she was seen in ED yesterday and diagnosed with bronchitis. Started on antibiotic. Recently had an ablation for cardiac arrhythmias. Refused a breathing treatment yesterday because she was afraid of the stimulant. Still having wheezing today and would like to see her provider. Appointment made for today.   Reason for Disposition . [1] Intermittent fever > 100.0 F (37.8 C) AND [2] lasts > 3 weeks  Answer Assessment - Initial Assessment Questions 1. TEMPERATURE: "What is the most recent temperature?"  "How was it measured?"      Running low grade fever 2. ONSET: "When did the fever start?"      Yesterday 102.6 3. SYMPTOMS: "Do you have any other symptoms besides the fever?"  (e.g., colds, headache, sore throat, earache, cough, rash, diarrhea, vomiting, abdominal pain)     Diagnosed with bronchitis 4. CAUSE: If there are no symptoms, ask: "What do you think is causing the fever?"      Seen in ED with bronchitis 5. CONTACTS: "Does anyone else in the family have an infection?"     Yes - everyone has had acold 6. TREATMENT: "What have you done so far to treat this fever?" (e.g., medications)     Tylenol  7. IMMUNOCOMPROMISE: "Do you have of the following: diabetes, HIV positive, splenectomy, cancer chemotherapy, chronic steroid treatment, transplant patient, etc."     No 8. PREGNANCY: "Is there any chance you are pregnant?" "When was your last menstrual period?"     No 9. TRAVEL: "Have you traveled out of the country in the last month?" (e.g., travel history, exposures)     No  Protocols used: FEVER-A-AH

## 2017-10-20 NOTE — Assessment & Plan Note (Signed)
  Bronchitis, bronchospasm: No history of previous asthma, symptoms consistent with bronchitis and bronchospasm, already on antibiotics. Although she continue with symptoms she is overall slightly better.  She is reluctant to take inhalers due to history of recent A. fib, I understand her concerns.  We agreed to add prednisone and continue the other medications.  Call if not better.  See AVS.

## 2017-10-26 DIAGNOSIS — E041 Nontoxic single thyroid nodule: Secondary | ICD-10-CM | POA: Diagnosis not present

## 2017-10-26 DIAGNOSIS — E210 Primary hyperparathyroidism: Secondary | ICD-10-CM | POA: Diagnosis not present

## 2017-11-15 ENCOUNTER — Telehealth: Payer: Self-pay | Admitting: Internal Medicine

## 2017-11-15 MED FILL — FLECAINIDE ACETATE 150 MG T: 150 | 30 days supply | Qty: 30 | Fill #2

## 2017-11-15 MED FILL — dilTIAZem HCL 30 MG TABS: 30 | 5 days supply | Qty: 30 | Fill #1

## 2017-11-15 NOTE — Telephone Encounter (Signed)
Was RX hydrocodone at the ER, will delete from med list, RF Tylenol #3

## 2017-11-15 NOTE — Telephone Encounter (Signed)
Pt is requesting refill on Tylenol #3.   Last OV: 10/19/2017 Last Fill:  10/01/2017 #30 and 0RF UDS: 12/04/2016 Low risk  NCCR in media from 10/01/2017

## 2017-11-16 MED FILL — ACETAMINOPHEN/COD #3 TABLET: 300-30 | 30 days supply | Qty: 30 | Fill #0

## 2017-12-06 ENCOUNTER — Ambulatory Visit: Payer: Medicare Other | Admitting: Internal Medicine

## 2017-12-06 ENCOUNTER — Encounter: Payer: Self-pay | Admitting: Internal Medicine

## 2017-12-06 VITALS — BP 120/68 | HR 64 | Ht 67.0 in | Wt 255.2 lb

## 2017-12-06 DIAGNOSIS — R918 Other nonspecific abnormal finding of lung field: Secondary | ICD-10-CM | POA: Diagnosis not present

## 2017-12-06 LAB — CBC WITH DIFFERENTIAL/PLATELET
BASOS ABS: 0 10*3/uL (ref 0.0–0.1)
Basophils Relative: 0.8 % (ref 0.0–3.0)
EOS PCT: 4.7 % (ref 0.0–5.0)
Eosinophils Absolute: 0.3 10*3/uL (ref 0.0–0.7)
HCT: 41.5 % (ref 36.0–46.0)
Hemoglobin: 13.6 g/dL (ref 12.0–15.0)
LYMPHS ABS: 1.9 10*3/uL (ref 0.7–4.0)
Lymphocytes Relative: 33 % (ref 12.0–46.0)
MCHC: 32.7 g/dL (ref 30.0–36.0)
MCV: 87.8 fl (ref 78.0–100.0)
MONO ABS: 0.5 10*3/uL (ref 0.1–1.0)
Monocytes Relative: 9 % (ref 3.0–12.0)
NEUTROS ABS: 3.1 10*3/uL (ref 1.4–7.7)
NEUTROS PCT: 52.5 % (ref 43.0–77.0)
PLATELETS: 227 10*3/uL (ref 150.0–400.0)
RBC: 4.73 Mil/uL (ref 3.87–5.11)
RDW: 14.6 % (ref 11.5–15.5)
WBC: 5.9 10*3/uL (ref 4.0–10.5)

## 2017-12-06 LAB — SEDIMENTATION RATE: SED RATE: 16 mm/h (ref 0–30)

## 2017-12-06 NOTE — Progress Notes (Addendum)
Veronica Bradford, female    DOB: 01-Jan-1946   MRN: [id number]    Brief patient profile:  61 yowf quit smoking 1982 s/p hysterectomy stage 1 adenocarcinoma around 2004 and has kept up with coloscopy and mammography  Referred by Dr Joylene Grapes for incidental MPNs on CT heart scan.  History of Present Illness  12/06/2017  Pulmonary/ 1st office eval/   feels overall the same for years, no wt loss, no known active primary ca or for above or h/o collagen vasc dz   Dyspnea:  Not limited by breathing from desired activities  / cardio at gym limited by knees  Cough: none Sleep: on cpap / head flat  SABA use: none    No obvious day to day or daytime variability or assoc excess/ purulent sputum or mucus plugs or hemoptysis or cp or chest tightness, subjective wheeze or overt sinus or hb symptoms.   Sleeping on cpap as above  without nocturnal  or early am exacerbation  of respiratory  c/o's or need for noct saba. Also denies any obvious fluctuation of symptoms with weather or environmental changes or other aggravating or alleviating factors except as outlined above   No unusual exposure hx or h/o childhood pna/ asthma or knowledge of premature birth.  Current Allergies, Complete Past Medical History, Past Surgical History, Family History, and Social History were reviewed in [employer] record.  ROS  The following are not active complaints unless bolded Hoarseness, sore throat, dysphagia, dental problems, itching, sneezing,  nasal congestion or discharge of excess mucus or purulent secretions, ear ache,   fever, chills, sweats, unintended wt loss or wt gain, classically pleuritic or exertional cp,  orthopnea pnd or arm/hand swelling  or leg feet swelling, presyncope, palpitations, abdominal pain, anorexia, nausea, vomiting, diarrhea  or change in bowel habits or change in bladder habits, change in stools or change in urine, dysuria, hematuria,  rash, arthralgias, visual complaints,  headache, numbness, weakness or ataxia or problems with walking or coordination,  change in mood or  memory.           Past Medical History:  Diagnosis Date  . Back pain   . Chronic sinusitis    s/p 2 surgeries remotely, [name]  . Closed fracture of metatarsal of left foot    L foot, fifth metatarsal  . Ectopic pregnancy   . Endometrial cancer ([place]) 2004   Early diagnosis  . GERD (gastroesophageal reflux disease)   . Hyperlipemia   . Hypertension   . IBS (irritable bowel syndrome) 08/13/2014   Dx 2015, diarrhea on-off, Dr [name]   . Lumbar radiculopathy 08/2012   MRI in 08/2012  . Mild depression ([place])    Pt's son has Down's Syndrome  . Mild hyperparathyroidism ([place])   . Osteopenia   . PAF (paroxysmal atrial fibrillation) ([place])   . Prediabetes   . Sciatica of right side 08/2013   Received Depomedrol 80 mg injection  . Sleep apnea 09/09/2013   Dx with OSA in 2015, by Dr. Caryl Comes, APAP    Outpatient Medications Prior to Visit  Medication Sig Dispense Refill  . acetaminophen-codeine (TYLENOL #3) 300-30 MG tablet TAKE 1 TABLET BY MOUTH DAILY AS NEEDED FOR MODERATE PAIN. 30 tablet 0  . cefUROXime (CEFTIN) 500 MG tablet Take 1 tablet (500 mg total) by mouth 2 (two) times daily with a meal. 20 tablet 0  . Cholecalciferol (VITAMIN D) 2000 units CAPS Take 2,000 Units by mouth daily.     Marland Kitchen  Chromium Picolinate 1000 MCG TABS Take 1,000 mcg by mouth every evening.    . Coenzyme Q10 (CO Q 10) 100 MG CAPS Take 100 mg by mouth daily.     Marland Kitchen diltiazem (CARDIZEM) 30 MG tablet Take 1 tablet every 4 hours AS NEEDED for heart rate >100 as long as blood pressure >100. (Patient taking differently: Take 30 mg by mouth every 4 (four) hours as needed (for AFIB). ) 45 tablet 1  . flecainide (TAMBOCOR) 150 MG tablet Take 2 tablets (300 mg total) by mouth as needed (FOR AFIB). Pill in a pocket (Patient taking differently: Take 300 mg by mouth daily as needed (for AFIB). ) 30 tablet 6  . fluticasone (FLONASE)  50 MCG/ACT nasal spray Place 1 spray into both nostrils daily.     Marland Kitchen MAGNESIUM-OXIDE PO Take 500 mg by mouth 2 (two) times daily.     . Multiple Vitamins-Minerals (MULTIVITAMIN PO) Take 1 tablet by mouth daily.    . Omega-3 Fatty Acids (FISH OIL) 1000 MG CAPS Take 1,000 mg by mouth daily.     . pantoprazole (PROTONIX) 40 MG tablet Take 40 mg by mouth daily.    . pravastatin (PRAVACHOL) 40 MG tablet Take 1 tablet (40 mg total) by mouth at bedtime. 90 tablet 1  . predniSONE (DELTASONE) 10 MG tablet 2 tabs a day x 5 days 10 tablet 0  . valsartan (DIOVAN) 320 MG tablet Take 0.5 tablets (160 mg total) by mouth daily. (Patient taking differently: Take 160 mg by mouth 2 (two) times daily. ) 90 tablet 1  . vitamin B-12 (CYANOCOBALAMIN) 1000 MCG tablet Take 2,000 mcg by mouth every evening.     Alveda Reasons 20 MG TABS tablet TAKE 1 TABLET BY MOUTH  DAILY WITH SUPPER (Patient taking differently: Take 20 mg by mouth daily with supper. ) 90 tablet 2   No facility-administered medications prior to visit.      Objective:     BP 120/68 (BP Location: Left Arm, Patient Position: Sitting, Cuff Size: Large)   Pulse 64   Ht '5\' 7"'$  (1.702 m)   Wt 255 lb 3.2 oz (115.8 kg)   SpO2 98%   BMI 39.97 kg/m      Vital signs reviewed - Note on arrival 02 sats  98% on RA  HEENT: nl dentition, turbinates bilaterally, and oropharynx. Nl external ear canals without cough reflex   NECK :  without JVD/Nodes/TM/ nl carotid upstrokes bilaterally   LUNGS: no acc muscle use,  Nl contour chest which is clear to A and P bilaterally without cough on insp or exp maneuvers   CV:  slt irreg with   no s3 or II/VI hsm- No  increase in P2, and  Trace bilateral pedal edema   ABD: obese  soft and nontender with nl inspiratory excursion in the supine position. No bruits or organomegaly appreciated, bowel sounds nl  MS:  Nl gait/ ext warm without deformities, calf tenderness, cyanosis or clubbing No obvious joint restrictions    SKIN: warm and dry without lesions    NEURO:  alert, approp, nl sensorium with  no motor or cerebellar deficits apparent.      Labs ordered/ reviewed:    Lab Results  Component Value Date   WBC 5.9 12/06/2017   HGB 13.6 12/06/2017   HCT 41.5 12/06/2017   MCV 87.8 12/06/2017   PLT 227.0 12/06/2017      Labs ordered 12/06/2017   Quant Gold TB  Lab Results  Component Value Date   ESRSEDRATE 16 12/06/2017        I personally reviewed images and agree with radiology impression as follows:   Chest CT 08/28/17 No acute abnormality. Small pulmonary nodules, largest measuring 5 mm. No follow-up needed if patient is low-risk (and has no known or suspected primary neoplasm). Non-contrast chest CT can be considered in 12 months if patient is high-risk.     Assessment   Multiple pulmonary nodules determined by computed tomography of lung See CT chest 08/28/17 all < 5 mm - ESR 12/06/2017  16  - GOLD TB plus pending    She does not have a significant smoking hx but does have an incidentally noted G1 adeno of uterus p hysterectomy for unrelated problem 15 years prior to OV.  So she is at low but not minimal risk and reasonable to do f/u ct chest at one year -  I don't feel strongly about this based on [facility] guidelines as reviewed with the pt and the f/u is optional, not mandatory, but she is agreeeable to the one year f/u and nothing further at that point if stable - if she has new resp symptoms in meantime CT needs to be moved up and of course if needs repeat cardiac CT for some reason in that same time frame  then would not need the f/u  At one year ct here.   Discussed in detail all the  indications, usual  risks and alternatives  relative to the benefits with patient who agrees to proceed with conservative f/u with  >>> CT chest placed in reminder file for 08/29/18      Total time devoted to counseling  > 50 % of initial 40  min office visit:  review case with pt/  discussion of options/alternatives/ personally creating written customized instructions  in presence of pt  then going over those specific  Instructions directly with the pt including how to use all of the meds but in particular covering each new medication in detail and the difference between the maintenance= "automatic" meds and the prns using an action plan format for the latter (If this problem/symptom => do that organization reading Left to right).  Please see AVS from this visit for a full list of these instructions which I personally wrote for this pt and  are unique to this visit.                Christinia Gully, MD 12/06/2017

## 2017-12-06 NOTE — Patient Instructions (Addendum)
Please remember to go to the lab department   for your tests - we will call you with the results when they are available.      We will contact you for a follow up ct chest  Around 08/29/18

## 2017-12-07 ENCOUNTER — Encounter: Payer: Self-pay | Admitting: Internal Medicine

## 2017-12-07 NOTE — Assessment & Plan Note (Addendum)
See CT chest 08/28/17 all < 5 mm - ESR 12/06/2017  16  - GOLD TB plus pending    She does not have a significant smoking hx but does have an incidentally noted G1 adeno of uterus p hysterectomy for unrelated problem 15 years prior to OV.  So she is at low but not minimal risk and reasonable to do f/u ct chest at one year -  I don't feel strongly about this based on [facility] guidelines as reviewed with the pt and the f/u is optional, not mandatory, but she is agreeeable to the one year f/u and nothing further at that point if stable - if she has new resp symptoms in meantime CT needs to be moved up and of course if needs repeat cardiac CT for some reason in that same time frame  then would not need the f/u  At one year ct here.   Discussed in detail all the  indications, usual  risks and alternatives  relative to the benefits with patient who agrees to proceed with conservative f/u with  >>> CT chest placed in reminder file for 08/29/18      Total time devoted to counseling  > 50 % of initial 40  min office visit:  review case with pt/ discussion of options/alternatives/ personally creating written customized instructions  in presence of pt  then going over those specific  Instructions directly with the pt including how to use all of the meds but in particular covering each new medication in detail and the difference between the maintenance= "automatic" meds and the prns using an action plan format for the latter (If this problem/symptom => do that organization reading Left to right).  Please see AVS from this visit for a full list of these instructions which I personally wrote for this pt and  are unique to this visit.

## 2017-12-08 LAB — QUANTIFERON-TB GOLD PLUS
Mitogen-NIL: 9.25 IU/mL
NIL: 0.02 IU/mL
QuantiFERON-TB Gold Plus: NEGATIVE
TB1-NIL: 0 [IU]/mL
TB2-NIL: 0 [IU]/mL

## 2017-12-10 NOTE — Progress Notes (Signed)
Spoke with pt and notified of results per Dr. Wert. Pt verbalized understanding and denied any questions. 

## 2017-12-11 DIAGNOSIS — G4733 Obstructive sleep apnea (adult) (pediatric): Secondary | ICD-10-CM | POA: Diagnosis not present

## 2017-12-13 ENCOUNTER — Ambulatory Visit: Payer: Medicare Other | Admitting: Internal Medicine

## 2017-12-13 ENCOUNTER — Encounter: Payer: Self-pay | Admitting: Internal Medicine

## 2017-12-13 VITALS — BP 126/70 | HR 60 | Ht 67.0 in | Wt 249.2 lb

## 2017-12-13 DIAGNOSIS — I480 Paroxysmal atrial fibrillation: Secondary | ICD-10-CM

## 2017-12-13 DIAGNOSIS — G4733 Obstructive sleep apnea (adult) (pediatric): Secondary | ICD-10-CM

## 2017-12-13 MED ORDER — FLECAINIDE ACETATE 150 MG PO TABS: 300.0000 mg | ORAL_TABLET | ORAL | 6 refills | Status: DC | PRN

## 2017-12-13 MED ORDER — DILTIAZEM HCL 30 MG PO TABS: ORAL_TABLET | ORAL | 1 refills | Status: DC

## 2017-12-13 NOTE — Progress Notes (Signed)
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 female who presents today for routine electrophysiology followup.  Since her recent afib ablation, the patient reports doing very well.  she denies procedure related complications and is pleased with the results of the procedure.  She had bronchitis recently and has significantly reduced her exercise.  She has rare palpitations but also notices lability in her heart rate which concerns her at times. Today, she denies symptoms of chest pain, shortness of breath,  lower extremity edema, dizziness, presyncope, or syncope.  The patient is otherwise without complaint today.   Past Medical History:  Diagnosis Date  . Back pain   . Chronic sinusitis    s/p 2 surgeries remotely, [name]  . Closed fracture of metatarsal of left foot    L foot, fifth metatarsal  . Ectopic pregnancy   . Endometrial cancer ([place]) 2004   Early diagnosis  . GERD (gastroesophageal reflux disease)   . Hyperlipemia   . Hypertension   . IBS (irritable bowel syndrome) 08/13/2014   Dx 2015, diarrhea on-off, Dr [name]   . Lumbar radiculopathy 08/2012   MRI in 08/2012  . Mild depression ([place])    Pt's son has Down's Syndrome  . Mild hyperparathyroidism ([place])   . Osteopenia   . PAF (paroxysmal atrial fibrillation) ([place])   . Prediabetes   . Sciatica of right side 08/2013   Received Depomedrol 80 mg injection  . Sleep apnea 09/09/2013   Dx with OSA in 2015, by Dr. Caryl Comes, APAP   Past Surgical History:  Procedure Laterality Date  . ABDOMINAL HYSTERECTOMY    . ATRIAL FIBRILLATION ABLATION N/A 09/04/2017   Procedure: ATRIAL FIBRILLATION ABLATION;  Surgeon: [name], MD;  Location: [facility];  Service: Cardiovascular;  Laterality: N/A;  . CHOLECYSTECTOMY  ~2006  . DILATION AND CURETTAGE OF UTERUS     after SAB  . ESOPHAGOGASTRODUODENOSCOPY  02/09/2014   Dr. [name]  . FINGER SURGERY Left    index  . NASAL SINUS SURGERY     x 2 remotely, Dr  Lucia Gaskins  . TONSILLECTOMY    . TOTAL ABDOMINAL HYSTERECTOMY W/ BILATERAL SALPINGOOPHORECTOMY    . TOTAL KNEE ARTHROPLASTY Right ~2008    ROS- all systems are personally reviewed and negatives except as per HPI above  Current Outpatient Medications  Medication Sig Dispense Refill  . acetaminophen-codeine (TYLENOL #3) 300-30 MG tablet TAKE 1 TABLET BY MOUTH DAILY AS NEEDED FOR MODERATE PAIN. 30 tablet 0  . Cholecalciferol (VITAMIN D) 2000 units CAPS Take 2,000 Units by mouth daily.     . Chromium Picolinate 1000 MCG TABS Take 1,000 mcg by mouth every evening.    . Coenzyme Q10 (CO Q 10) 100 MG CAPS Take 100 mg by mouth daily.     Marland Kitchen diltiazem (CARDIZEM) 30 MG tablet Take 1 tablet every 4 hours AS NEEDED for heart rate >100 as long as blood pressure >100. (Patient taking differently: Take 30 mg by mouth every 4 (four) hours as needed (for AFIB). ) 45 tablet 1  . flecainide (TAMBOCOR) 150 MG tablet Take 2 tablets (300 mg total) by mouth as needed (FOR AFIB). Pill in a pocket (Patient taking differently: Take 300 mg by mouth daily as needed (for AFIB). ) 30 tablet 6  . fluticasone (FLONASE) 50 MCG/ACT nasal spray Place 1 spray into both nostrils daily.     Marland Kitchen MAGNESIUM-OXIDE PO Take 500 mg by mouth  2 (two) times daily.     . Multiple Vitamins-Minerals (MULTIVITAMIN PO) Take 1 tablet by mouth daily.    . Omega-3 Fatty Acids (FISH OIL) 1000 MG CAPS Take 1,000 mg by mouth daily.     . pantoprazole (PROTONIX) 40 MG tablet Take 40 mg by mouth daily.    . pravastatin (PRAVACHOL) 40 MG tablet Take 1 tablet (40 mg total) by mouth at bedtime. 90 tablet 1  . rivaroxaban (XARELTO) 20 MG TABS tablet Take 20 mg by mouth daily.    . valsartan (DIOVAN) 320 MG tablet Take 0.5 tablets (160 mg total) by mouth daily. (Patient taking differently: Take 160 mg by mouth 2 (two) times daily. ) 90 tablet 1  . vitamin B-12 (CYANOCOBALAMIN) 1000 MCG tablet Take 2,000 mcg by mouth every evening.      No current  facility-administered medications for this visit.     Physical Exam: Vitals:   12/13/17 0932  BP: 126/70  Pulse: 60  SpO2: 99%  Weight: 249 lb 3.2 oz (113 kg)  Height: 5\' 7"  (1.702 m)    GEN- The patient is well appearing, alert and oriented x 3 today.   Head- normocephalic, atraumatic Eyes-  Sclera clear, conjunctiva pink Ears- hearing intact Oropharynx- clear Lungs- Clear to ausculation bilaterally, normal work of breathing Heart- Regular rate and rhythm, no murmurs, rubs or gallops, PMI not laterally displaced GI- soft, NT, ND, + BS Extremities- no clubbing, cyanosis, or edema  EKG tracing ordered today is personally reviewed and shows sinus rhythm 60 bpm, RBBB, LAHB  Assessment and Plan:  1. Paroxysmal atrial fibrillation and atrial flutter Doing well s/p ablation off AAD therapy She has "pill in pocket' flecainide and diltiazem chads2vasc score is 3.  Continue anticoagulation Consider ILR if she still has palpitations on return in 3 months I would not advise 30 day monitors for her going forward  2. Obesity Body mass index is 39.03 kg/m. Lifestyle modification encouraged Regular exercise encouraged  3. OSA Compliance with CPAP encouraged  4. HTN Stable No change required today  Return to see me in 3 months  [name] MD, [facility] 12/13/2017 9:50 AM

## 2017-12-13 NOTE — Patient Instructions (Addendum)
Medication Instructions:  Your physician recommends that you continue on your current medications as directed. Please refer to the Current Medication list given to you today.  Labwork: None ordered.  Testing/Procedures: None ordered.  Follow-Up: Your physician wants you to follow-up in: 3 months with Dr. Allred.      Any Other Special Instructions Will Be Listed Below (If Applicable).  If you need a refill on your cardiac medications before your next appointment, please call your pharmacy.   

## 2017-12-24 ENCOUNTER — Telehealth: Payer: Self-pay | Admitting: Internal Medicine

## 2017-12-24 MED FILL — ACETAMINOPHEN/COD #3 TABLET: 300-30 | 30 days supply | Qty: 30 | Fill #0

## 2017-12-24 NOTE — Telephone Encounter (Signed)
sent 

## 2017-12-24 NOTE — Telephone Encounter (Signed)
Pt requesting refill on Tylenol #3.   Last OV: 10/19/2017 Last Fill: 11/15/2017 #30 and 0RF UDS: 12/04/2016 Low risk  NCCR in media from 10/01/2017

## 2018-01-01 ENCOUNTER — Other Ambulatory Visit: Payer: Self-pay

## 2018-01-01 ENCOUNTER — Emergency Department
Admission: EM | Admit: 2018-01-01 | Discharge: 2018-01-01 | Disposition: A | Discharge status: Home / Self Care | Payer: Medicare Other | Source: Home / Self Care

## 2018-01-01 ENCOUNTER — Encounter: Payer: Self-pay | Admitting: Emergency Medicine

## 2018-01-01 DIAGNOSIS — R300 Dysuria: Secondary | ICD-10-CM | POA: Diagnosis not present

## 2018-01-01 LAB — POCT URINALYSIS DIP (MANUAL ENTRY)
BILIRUBIN UA: NEGATIVE
BILIRUBIN UA: NEGATIVE mg/dL
Blood, UA: NEGATIVE
Glucose, UA: NEGATIVE mg/dL
LEUKOCYTES UA: NEGATIVE
Nitrite, UA: NEGATIVE
PROTEIN UA: NEGATIVE mg/dL
SPEC GRAV UA: 1.01 (ref 1.010–1.025)
Urobilinogen, UA: 0.2 E.U./dL
pH, UA: 7 (ref 5.0–8.0)

## 2018-01-01 MED ORDER — NITROFURANTOIN MONOHYD MACRO 100 MG PO CAPS: 100.0000 mg | ORAL_CAPSULE | Freq: Two times a day (BID) | ORAL | 0 refills | Status: DC

## 2018-01-01 NOTE — ED Triage Notes (Signed)
Dysuria x 3 days

## 2018-01-01 NOTE — Discharge Instructions (Addendum)
10 you to drink plenty of fluids  Your urine culture is pending  Take the nitrofurantoin (Macrobid) 100 mg 1 twice daily.  Since you are going to [place], you might want to check back in a few days to see if a culture result has returned.  If nothing grows you can discontinue the medicine.  Take Pyridium as needed.  Get rechecked if you are concerned that something is getting worse or if you are having fever or chills or vomiting or generally getting sicker.

## 2018-01-01 NOTE — ED Provider Notes (Addendum)
[facility]    CSN: [id number] Arrival date & time: 01/01/18  [place]     History   Chief Complaint Chief Complaint  Patient presents with  . Dysuria    HPI Veronica Bradford is a 72 y.o. female.   HPI Patient has dysuria for the past 3 days.  She has been drinking a lot of water and take some Pyridium.  A year and a half ago she had a similar symptoms and ended up having an enterococcus UTI which was somewhat resistant on gave some difficulty getting cleared up.  She is getting ready to go out of town to [place] in a couple of days, and a little anxious about this.  She has not had any fever or chills.  She has not had sexual intercourse for about 7 years, so cannot blame that.  She dipped a urine at home and was positive for leukocytes.  I explained to her that the test here was negative.  We discussed possibilities.   Past Medical History:  Diagnosis Date  . Back pain   . Chronic sinusitis    s/p 2 surgeries remotely, [name]  . Closed fracture of metatarsal of left foot    L foot, fifth metatarsal  . Ectopic pregnancy   . Endometrial cancer ([place]) 2004   Early diagnosis  . GERD (gastroesophageal reflux disease)   . Hyperlipemia   . Hypertension   . IBS (irritable bowel syndrome) 08/13/2014   Dx 2015, diarrhea on-off, Dr [name]   . Lumbar radiculopathy 08/2012   MRI in 08/2012  . Mild depression ([place])    Pt's son has Down's Syndrome  . Mild hyperparathyroidism ([place])   . Osteopenia   . PAF (paroxysmal atrial fibrillation) ([place])   . Prediabetes   . Sciatica of right side 08/2013   Received Depomedrol 80 mg injection  . Sleep apnea 09/09/2013   Dx with OSA in 2015, by Dr. Caryl Comes, APAP    Patient Active Problem List   Diagnosis Date Noted  . Multiple pulmonary nodules determined by computed tomography of lung 12/06/2017  . Paroxysmal atrial fibrillation ([place]) 09/04/2017  . Osteopenia 05/23/2017  . Annual physical exam 04/14/2015  . PCP NOTES >>>>>  12/23/2014  . IBS (irritable bowel syndrome) 08/13/2014  . Hyperglycemia   . Chronic sinusitis   . Endometrial cancer ([place])   . Back pain-- on tylenol #3 prn   . GERD (gastroesophageal reflux disease)   . HTN (hypertension) 09/09/2013  . Diastolic dysfunction-grade 2 09/09/2013  . Chronic anticoagulation 09/09/2013  . Edema of both legs 09/09/2013  . Dyslipidemia 09/09/2013  . Obesity (BMI 30-39.9) 09/09/2013  . Sleep apnea-- Dr Maxwell Caul, on CPAP 09/09/2013  . PAF (paroxysmal atrial fibrillation) ([place]) 06/20/2013    Past Surgical History:  Procedure Laterality Date  . ABDOMINAL HYSTERECTOMY    . ATRIAL FIBRILLATION ABLATION N/A 09/04/2017   Procedure: ATRIAL FIBRILLATION ABLATION;  Surgeon: [name], MD;  Location: [facility];  Service: Cardiovascular;  Laterality: N/A;  . CHOLECYSTECTOMY  ~2006  . DILATION AND CURETTAGE OF UTERUS     after SAB  . ESOPHAGOGASTRODUODENOSCOPY  02/09/2014   Dr. [name]  . FINGER SURGERY Left    index  . NASAL SINUS SURGERY     x 2 remotely, [name]  . TONSILLECTOMY    . TOTAL ABDOMINAL HYSTERECTOMY W/ BILATERAL SALPINGOOPHORECTOMY    . TOTAL KNEE ARTHROPLASTY Right ~2008    OB History    Gravida  4   Para  1   Term  1   Preterm      AB  3   Living  1     SAB  2   TAB      Ectopic  1   Multiple      Live Births  1            Home Medications    Prior to Admission medications   Medication Sig Start Date End Date Taking? Authorizing Provider  acetaminophen-codeine (TYLENOL #3) 300-30 MG tablet TAKE 1 TABLET BY MOUTH DAILY AS NEEDED FOR MODERATE PAIN. 12/24/17   Colon Branch, MD  Cholecalciferol (VITAMIN D) 2000 units CAPS Take 2,000 Units by mouth daily.     [provider]  Chromium Picolinate 1000 MCG TABS Take 1,000 mcg by mouth every evening.    [provider]  ciprofloxacin (CIPRO) 500 MG tablet Take 1 tablet (500 mg total) by mouth 2 (two) times daily for 7 days. 01/04/18 01/11/18   Leamon Arnt, MD  Coenzyme Q10 (CO Q 10) 100 MG CAPS Take 100 mg by mouth daily.     [provider]  diltiazem (CARDIZEM) 30 MG tablet Take 1 tablet every 4 hours AS NEEDED for heart rate >100 as long as blood pressure >100. 12/13/17   Allred, Jeneen Rinks, MD  flecainide (TAMBOCOR) 150 MG tablet Take 2 tablets (300 mg total) by mouth as needed (FOR AFIB). Pill in a pocket 12/13/17   Allred, Jeneen Rinks, MD  fluticasone (FLONASE) 50 MCG/ACT nasal spray Place 1 spray into both nostrils daily.     [provider]  MAGNESIUM-OXIDE PO Take 500 mg by mouth 2 (two) times daily.     [provider]  metroNIDAZOLE (FLAGYL) 500 MG tablet Take 1 tablet (500 mg total) by mouth 2 (two) times daily for 7 days. 01/04/18 01/11/18  Leamon Arnt, MD  Multiple Vitamins-Minerals (MULTIVITAMIN PO) Take 1 tablet by mouth daily.    [provider]  Omega-3 Fatty Acids (FISH OIL) 1000 MG CAPS Take 1,000 mg by mouth daily.     [provider]  pantoprazole (PROTONIX) 40 MG tablet Take 40 mg by mouth daily.    [provider]  pravastatin (PRAVACHOL) 40 MG tablet Take 1 tablet (40 mg total) by mouth at bedtime. 08/09/17   Colon Branch, MD  rivaroxaban (XARELTO) 20 MG TABS tablet Take 20 mg by mouth daily.    [provider]  valsartan (DIOVAN) 320 MG tablet Take 0.5 tablets (160 mg total) by mouth daily. Patient taking differently: Take 160 mg by mouth 2 (two) times daily.  09/13/17   Sherran Needs, NP  vitamin B-12 (CYANOCOBALAMIN) 1000 MCG tablet Take 2,000 mcg by mouth every evening.     [provider]    Family History Family History  Problem Relation Age of Onset  . Hypertension Mother   . Colon cancer Mother   . Breast cancer Mother   . Ovarian cancer Mother   . Hypertension Father   . Diabetes Father   . Head & neck cancer Sister   . CAD Neg Hx     Social History Social History   Tobacco Use  . Smoking status: Former Smoker    Last  attempt to quit: 01/06/1980    Years since quitting: 38.0  . Smokeless tobacco: Never Used  . Tobacco comment: smoked from 1970 to 1982, less than 1 ppd  Substance Use Topics  .  Alcohol use: Yes    Comment: rare  . Drug use: No     Allergies   Nsaids; Penicillins; Tolmetin; Zoster vaccine live; and Amoxicillin   Review of Systems Review of Systems Incisional: Unremarkable No other systemic complaints  Physical Exam Triage Vital Signs ED Triage Vitals  Enc Vitals Group     BP 01/01/18 1932 (!) 171/70     Pulse Rate 01/01/18 1932 67     Resp --      Temp 01/01/18 1932 97.6 F (36.4 C)     Temp Source 01/01/18 1932 Oral     SpO2 01/01/18 1932 98 %     Weight 01/01/18 1933 245 lb (111.1 kg)     Height 01/01/18 1933 5\' 7"  (1.702 m)     Head Circumference --      Peak Flow --      Pain Score 01/01/18 1932 2     Pain Loc --      Pain Edu? --      Excl. in [place]? --    No data found.  Updated Vital Signs BP (!) 171/70 (BP Location: Right Arm)   Pulse 67   Temp 97.6 F (36.4 C) (Oral)   Ht 5\' 7"  (1.702 m)   Wt 111.1 kg   SpO2 98%   BMI 38.37 kg/m   Visual Acuity Right Eye Distance:   Left Eye Distance:   Bilateral Distance:    Right Eye Near:   Left Eye Near:    Bilateral Near:     Physical Exam No acute distress.  No CVA tenderness.  Abdomen soft.  A little discomfort in the suprapubic region on palpation.  UC Treatments / Results  Labs (all labs ordered are listed, but only abnormal results are displayed) Labs Reviewed  URINE CULTURE  POCT URINALYSIS DIP (MANUAL ENTRY)    EKG None  Radiology No results found.  Procedures Procedures (including critical care time)  Medications Ordered in UC Medications - No data to display  Initial Impression / Assessment and Plan / UC Course  I have reviewed the triage vital signs and the nursing notes.  Pertinent labs & imaging results that were available during my care of the patient were reviewed by me and  considered in my medical decision making (see chart for details).     Dysuria, no definite UTI, but because of her history did go ahead and put her on antibiotics for a few days until the urine culture returned because she had had a positive dip at home. Final Clinical Impressions(s) / UC Diagnoses   Final diagnoses:  Dysuria     Discharge Instructions     10 you to drink plenty of fluids  Your urine culture is pending  Take the nitrofurantoin (Macrobid) 100 mg 1 twice daily.  Since you are going to [place], you might want to check back in a few days to see if a culture result has returned.  If nothing grows you can discontinue the medicine.  Take Pyridium as needed.  Get rechecked if you are concerned that something is getting worse or if you are having fever or chills or vomiting or generally getting sicker.    ED Prescriptions    Medication Sig Dispense Auth. Provider   nitrofurantoin, macrocrystal-monohydrate, (MACROBID) 100 MG capsule Take 1 capsule (100 mg total) by mouth 2 (two) times daily. 10 capsule Posey Boyer, MD     Controlled Substance Prescriptions [place] Controlled Substance Registry consulted? No  Posey Boyer, MD 01/01/18 2014    Posey Boyer, MD 01/04/18 [phone]

## 2018-01-03 ENCOUNTER — Telehealth: Payer: Self-pay | Admitting: *Deleted

## 2018-01-03 LAB — URINE CULTURE
MICRO NUMBER:: 91557067
Result:: NO GROWTH
SPECIMEN QUALITY: ADEQUATE

## 2018-01-03 NOTE — Telephone Encounter (Signed)
Callback: No answer, left message on mobile voicemail UCX negative. Call back as needed.

## 2018-01-04 ENCOUNTER — Encounter: Payer: Self-pay | Admitting: Family Medicine

## 2018-01-04 ENCOUNTER — Ambulatory Visit: Payer: Self-pay | Admitting: *Deleted

## 2018-01-04 ENCOUNTER — Ambulatory Visit (INDEPENDENT_AMBULATORY_CARE_PROVIDER_SITE_OTHER): Payer: Medicare Other | Admitting: Family Medicine

## 2018-01-04 ENCOUNTER — Other Ambulatory Visit: Payer: Self-pay

## 2018-01-04 VITALS — BP 122/70 | HR 61 | Temp 98.0°F | Resp 18 | Ht 67.0 in | Wt 252.6 lb

## 2018-01-04 DIAGNOSIS — K580 Irritable bowel syndrome with diarrhea: Secondary | ICD-10-CM | POA: Diagnosis not present

## 2018-01-04 MED ORDER — CIPROFLOXACIN HCL 500 MG PO TABS: 500.0000 mg | ORAL_TABLET | Freq: Two times a day (BID) | ORAL | 0 refills | Status: AC

## 2018-01-04 MED ORDER — METRONIDAZOLE 500 MG PO TABS: 500.0000 mg | ORAL_TABLET | Freq: Two times a day (BID) | ORAL | 0 refills | Status: AC

## 2018-01-04 NOTE — Patient Instructions (Signed)
Please follow up if symptoms do not improve or as needed.   I believe this is your IBS IF you get fever or increased pain, you may start the antibiotics to cover for diverticulitis.   If you get worse, please go to a medical facility for further evaluation.

## 2018-01-04 NOTE — Progress Notes (Signed)
Subjective  CC:   Chief Complaint  Patient presents with  . Abdominal Pain    History of IBS.Marland Kitchen Had diarrhea yesterday morning took Imodium.. Discomfort started at 3pm and is gradually decreasing     HPI: Veronica Bradford is a 73 y.o. female who presents to the office today to address the problems listed above in the chief complaint. Same day acute visit; PCP not available. New pt to me. Chart reviewed.    73 year old with history of IBS-D who presents with an episode of left lower quadrant abdominal pain that started off yesterday morning was associated with 2 episodes of watery diarrhea.  Pain was described as gnawing.  It persisted throughout the night.  However, pain is almost completely resolved since 10 AM this morning.  She has not had further episodes of diarrhea.  She had watery diarrhea without blood or mucus.  She is mildly stressed because tomorrow she is to leave in her RV for 19-month trip to [place].  She had a similar exacerbation last year at this time.  She had full GI evaluation then.  She did use Imodium yesterday.  Appetite was mildly decreased but no nausea vomiting or fevers.  She has had a colonoscopy and that did show diverticulosis.  She is never experienced an acute flare of diverticulitis.  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  No history of SBO.  She is feeling fairly well right now.  Assessment  1. Irritable bowel syndrome with diarrhea      Plan   Abdominal pain and diarrhea: Symptoms and exam most consistent with a bowel syndrome flare.  Reassured.  Given the left lower quadrant symptoms, this could be an early diverticulitis.  Because she is traveling, I gave her a printed prescription for Cipro and Flagyl to use if diarrhea and abdominal pain persist or fever develops.  She understands that if her symptoms worsen she will need to seek medical care again.  Patient understands and agrees with current care plan.  Follow up: Return  if symptoms worsen or fail to improve.  Visit date not found  No orders of the defined types were placed in this encounter.  Meds ordered this encounter  Medications  . ciprofloxacin (CIPRO) 500 MG tablet    Sig: Take 1 tablet (500 mg total) by mouth 2 (two) times daily for 7 days.    Dispense:  14 tablet    Refill:  0  . metroNIDAZOLE (FLAGYL) 500 MG tablet    Sig: Take 1 tablet (500 mg total) by mouth 2 (two) times daily for 7 days.    Dispense:  14 tablet    Refill:  0      I reviewed the patients updated PMH, FH, and SocHx.    Patient Active Problem List   Diagnosis Date Noted  . Multiple pulmonary nodules determined by computed tomography of lung 12/06/2017  . Paroxysmal atrial fibrillation ([place]) 09/04/2017  . Osteopenia 05/23/2017  . Annual physical exam 04/14/2015  . PCP NOTES >>>>> 12/23/2014  . IBS (irritable bowel syndrome) 08/13/2014  . Hyperglycemia   . Chronic sinusitis   . Endometrial cancer ([place])   . Back pain-- on tylenol #3 prn   . GERD (gastroesophageal reflux disease)   . HTN (hypertension) 09/09/2013  . Diastolic dysfunction-grade 2 09/09/2013  . Chronic anticoagulation 09/09/2013  . Edema of both legs 09/09/2013  . Dyslipidemia 09/09/2013  . Obesity (BMI 30-39.9) 09/09/2013  . Sleep apnea-- Dr  Osborne, on CPAP 09/09/2013  . PAF (paroxysmal atrial fibrillation) ([place]) 06/20/2013   Current Meds  Medication Sig  . acetaminophen-codeine (TYLENOL #3) 300-30 MG tablet TAKE 1 TABLET BY MOUTH DAILY AS NEEDED FOR MODERATE PAIN.  Marland Kitchen Cholecalciferol (VITAMIN D) 2000 units CAPS Take 2,000 Units by mouth daily.   . Chromium Picolinate 1000 MCG TABS Take 1,000 mcg by mouth every evening.  . Coenzyme Q10 (CO Q 10) 100 MG CAPS Take 100 mg by mouth daily.   Marland Kitchen diltiazem (CARDIZEM) 30 MG tablet Take 1 tablet every 4 hours AS NEEDED for heart rate >100 as long as blood pressure >100.  . flecainide (TAMBOCOR) 150 MG tablet Take 2 tablets (300 mg total) by mouth as  needed (FOR AFIB). Pill in a pocket  . fluticasone (FLONASE) 50 MCG/ACT nasal spray Place 1 spray into both nostrils daily.   Marland Kitchen MAGNESIUM-OXIDE PO Take 500 mg by mouth 2 (two) times daily.   . Multiple Vitamins-Minerals (MULTIVITAMIN PO) Take 1 tablet by mouth daily.  . Omega-3 Fatty Acids (FISH OIL) 1000 MG CAPS Take 1,000 mg by mouth daily.   . pantoprazole (PROTONIX) 40 MG tablet Take 40 mg by mouth daily.  . pravastatin (PRAVACHOL) 40 MG tablet Take 1 tablet (40 mg total) by mouth at bedtime.  . rivaroxaban (XARELTO) 20 MG TABS tablet Take 20 mg by mouth daily.  . valsartan (DIOVAN) 320 MG tablet Take 0.5 tablets (160 mg total) by mouth daily. (Patient taking differently: Take 160 mg by mouth 2 (two) times daily. )  . vitamin B-12 (CYANOCOBALAMIN) 1000 MCG tablet Take 2,000 mcg by mouth every evening.   . [DISCONTINUED] nitrofurantoin, macrocrystal-monohydrate, (MACROBID) 100 MG capsule Take 1 capsule (100 mg total) by mouth 2 (two) times daily.    Allergies: Patient is allergic to nsaids; penicillins; tolmetin; zoster vaccine live; and amoxicillin. Family History: Patient family history includes Breast cancer in her mother; Colon cancer in her mother; Diabetes in her father; Head & neck cancer in her sister; Hypertension in her father and mother; Ovarian cancer in her mother. Social History:  Patient  reports that she quit smoking about 38 years ago. She has never used smokeless tobacco. She reports current alcohol use. She reports that she does not use drugs.  Review of Systems: Constitutional: Negative for fever malaise or anorexia Cardiovascular: negative for chest pain Respiratory: negative for SOB or persistent cough Gastrointestinal: negative for abdominal pain  Objective  Vitals: BP 122/70   Pulse 61   Temp 98 F (36.7 C) (Oral)   Resp 18   Ht 5\' 7"  (1.702 m)   Wt 252 lb 9.6 oz (114.6 kg)   SpO2 98%   BMI 39.56 kg/m  General: no acute distress , A&Ox3, appears  well HEENT: PEERL, conjunctiva normal, Oropharynx moist,neck is supple Cardiovascular:  RRR without murmur or gallop.  Respiratory:  Good breath sounds bilaterally, CTAB with normal respiratory effort Gastrointestinal: soft, flat abdomen, normal active bowel sounds, no palpable masses, no hepatosplenomegaly, no appreciated hernias, mild left mid abdominal tenderness without rebound, guarding or masses.  Left lower quadrant is benign Skin:  Warm, no rashes     Commons side effects, risks, benefits, and alternatives for medications and treatment plan prescribed today were discussed, and the patient expressed understanding of the given instructions. Patient is instructed to call or message via MyChart if he/she has any questions or concerns regarding our treatment plan. No barriers to understanding were identified. We discussed Red Flag symptoms and signs  in detail. Patient expressed understanding regarding what to do in case of urgent or emergency type symptoms.   Medication list was reconciled, printed and provided to the patient in AVS. Patient instructions and summary information was reviewed with the patient as documented in the AVS. This note was prepared with assistance of Dragon voice recognition software. Occasional wrong-word or sound-a-like substitutions may have occurred due to the inherent limitations of voice recognition software

## 2018-01-04 NOTE — Telephone Encounter (Signed)
Patient is calling to report that she is having LLQ abdominal pain- she does have history of IBS and she states diverticula have been seen on colonoscopy- although she has never has a flare. Appointment schedule for evaluation of abdominal pain- PCP not available- she is aware to go to ED for fever or severe pain.    Reason for Disposition . [1] MILD-MODERATE pain AND [2] constant AND [3] present > 2 hours  Answer Assessment - Initial Assessment Questions 1. LOCATION: "Where does it hurt?"      LLQ 2. RADIATION: "Does the pain shoot anywhere else?" (e.g., chest, back)     stable 3. ONSET: "When did the pain begin?" (e.g., minutes, hours or days ago)      yesterday 4. SUDDEN: "Gradual or sudden onset?"     Gradual discomfort 5. PATTERN "Does the pain come and go, or is it constant?"    - If constant: "Is it getting better, staying the same, or worsening?"      (Note: Constant means the pain never goes away completely; most serious pain is constant and it progresses)     - If intermittent: "How long does it last?" "Do you have pain now?"     (Note: Intermittent means the pain goes away completely between bouts)     Constant- stabbing pain- boring pain 6. SEVERITY: "How bad is the pain?"  (e.g., Scale 1-10; mild, moderate, or severe)   - MILD (1-3): doesn't interfere with normal activities, abdomen soft and not tender to touch    - MODERATE (4-7): interferes with normal activities or awakens from sleep, tender to touch    - SEVERE (8-10): excruciating pain, doubled over, unable to do any normal activities      5-6 7. RECURRENT SYMPTOM: "Have you ever had this type of abdominal pain before?" If so, ask: "When was the last time?" and "What happened that time?"      Patient does have history bowel problems- intermittent pain that goes away 8. CAUSE: "What do you think is causing the abdominal pain?"     unknown 9. RELIEVING/AGGRAVATING FACTORS: "What makes it better or worse?" (e.g.,  movement, antacids, bowel movement)     Pain medication helped-but didn't relieve 10. OTHER SYMPTOMS: "Has there been any vomiting, diarrhea, constipation, or urine problems?"       Thought she was having urinary symptoms- she was seen and culture was clear- she was given Rx to have on hand, diarrhea 2 times yesterday and nausea- common with her IBS 11. PREGNANCY: "Is there any chance you are pregnant?" "When was your last menstrual period?"       n/a  Protocols used: ABDOMINAL PAIN - [facility]

## 2018-01-07 ENCOUNTER — Ambulatory Visit: Payer: Medicare Other | Admitting: Family Medicine

## 2018-01-07 ENCOUNTER — Other Ambulatory Visit (HOSPITAL_COMMUNITY): Payer: Self-pay | Admitting: *Deleted

## 2018-01-07 MED ORDER — RIVAROXABAN 20 MG PO TABS: 20.0000 mg | ORAL_TABLET | Freq: Every day | ORAL | 0 refills | Status: DC

## 2018-01-15 ENCOUNTER — Other Ambulatory Visit: Payer: Self-pay | Admitting: Internal Medicine

## 2018-03-15 ENCOUNTER — Ambulatory Visit: Payer: Medicare Other | Admitting: Internal Medicine

## 2018-03-19 ENCOUNTER — Other Ambulatory Visit: Payer: Self-pay | Admitting: Internal Medicine

## 2018-03-19 NOTE — Telephone Encounter (Signed)
Weight 114kg, age 73, SCr 0.67 on 10/18/17, CrCl 121mL/min. Last OV August 2019.

## 2018-03-28 DIAGNOSIS — G4733 Obstructive sleep apnea (adult) (pediatric): Secondary | ICD-10-CM | POA: Diagnosis not present

## 2018-04-25 ENCOUNTER — Telehealth: Payer: Self-pay

## 2018-04-25 NOTE — Telephone Encounter (Signed)
Spoke with pt regarding appt on 04/29/18. Pt stated she is unable to upload an EKG, but will check vitals prior to appt. Pt questions and concerns were address.

## 2018-04-29 ENCOUNTER — Telehealth (INDEPENDENT_AMBULATORY_CARE_PROVIDER_SITE_OTHER): Payer: Medicare Other | Admitting: Internal Medicine

## 2018-04-29 ENCOUNTER — Encounter: Payer: Self-pay | Admitting: Internal Medicine

## 2018-04-29 ENCOUNTER — Other Ambulatory Visit: Payer: Self-pay

## 2018-04-29 VITALS — BP 108/43 | HR 53

## 2018-04-29 DIAGNOSIS — I10 Essential (primary) hypertension: Secondary | ICD-10-CM | POA: Diagnosis not present

## 2018-04-29 DIAGNOSIS — I480 Paroxysmal atrial fibrillation: Secondary | ICD-10-CM

## 2018-04-29 DIAGNOSIS — G4733 Obstructive sleep apnea (adult) (pediatric): Secondary | ICD-10-CM

## 2018-04-29 NOTE — Progress Notes (Signed)
Electrophysiology TeleHealth Note   Due to national recommendations of social distancing due to COVID 19, an audio/video telehealth visit is felt to be most appropriate for this patient at this time.  See MyChart message from today for the patient's consent to telehealth for [facility].   Date:  04/29/2018   ID:  Veronica Bradford, DOB November 03, 1945, MRN [id number]  Location: patient's home  Provider location: [address] [place]  Evaluation Performed: Follow-up visit  PCP:  Colon Branch, MD   Chief Complaint:  afib  History of Present Illness:    Veronica Bradford is a 73 y.o. female who presents via audio/video conferencing for a telehealth visit today.  Since last being seen in our clinic, the patient reports doing very well. She has rare palpitations.  These will sometimes wake her from sleep.  She reports having an irregular pulse with heart rates 80s.  Today, she denies symptoms of  chest pain, shortness of breath,  lower extremity edema, dizziness, presyncope, or syncope.  The patient is otherwise without complaint today.  The patient denies symptoms of fevers, chills, cough, or new SOB worrisome for COVID 19.  Past Medical History:  Diagnosis Date  . Back pain   . Chronic sinusitis    s/p 2 surgeries remotely, [name]  . Closed fracture of metatarsal of left foot    L foot, fifth metatarsal  . Ectopic pregnancy   . Endometrial cancer ([place]) 2004   Early diagnosis  . GERD (gastroesophageal reflux disease)   . Hyperlipemia   . Hypertension   . IBS (irritable bowel syndrome) 08/13/2014   Dx 2015, diarrhea on-off, Dr [name]   . Lumbar radiculopathy 08/2012   MRI in 08/2012  . Mild depression ([place])    Pt's son has Down's Syndrome  . Mild hyperparathyroidism ([place])   . Osteopenia   . PAF (paroxysmal atrial fibrillation) ([place])   . Prediabetes   . Sciatica of right side 08/2013   Received Depomedrol 80 mg injection  . Sleep apnea 09/09/2013   Dx with OSA in  2015, by Dr. Caryl Comes, APAP    Past Surgical History:  Procedure Laterality Date  . ABDOMINAL HYSTERECTOMY    . ATRIAL FIBRILLATION ABLATION N/A 09/04/2017   Procedure: ATRIAL FIBRILLATION ABLATION;  Surgeon: [name], MD;  Location: [place] [facility];  Service: Cardiovascular;  Laterality: N/A;  . CHOLECYSTECTOMY  ~2006  . DILATION AND CURETTAGE OF UTERUS     after SAB  . ESOPHAGOGASTRODUODENOSCOPY  02/09/2014   Dr. [name]  . FINGER SURGERY Left    index  . NASAL SINUS SURGERY     x 2 remotely, [name]  . TONSILLECTOMY    . TOTAL ABDOMINAL HYSTERECTOMY W/ BILATERAL SALPINGOOPHORECTOMY    . TOTAL KNEE ARTHROPLASTY Right ~2008    Current Outpatient Medications  Medication Sig Dispense Refill  . acetaminophen-codeine (TYLENOL #3) 300-30 MG tablet TAKE 1 TABLET BY MOUTH DAILY AS NEEDED FOR MODERATE PAIN. 30 tablet 0  . Cholecalciferol (VITAMIN D) 2000 units CAPS Take 2,000 Units by mouth daily.     . Chromium Picolinate 1000 MCG TABS Take 1,000 mcg by mouth every evening.    . clindamycin (CLEOCIN) 150 MG capsule Take 1 capsule by mouth 3 (three) times daily.    . Coenzyme Q10 (CO Q 10) 100 MG CAPS Take 100 mg by mouth daily.     Marland Kitchen diltiazem (CARDIZEM) 30 MG tablet Take 1 tablet every 4  hours AS NEEDED for heart rate >100 as long as blood pressure >100. 45 tablet 1  . Flaxseed, Linseed, (FLAXSEED OIL) 1000 MG CAPS Take 1 capsule by mouth daily.    . flecainide (TAMBOCOR) 150 MG tablet Take 2 tablets (300 mg total) by mouth as needed (FOR AFIB). Pill in a pocket 30 tablet 6  . fluticasone (FLONASE) 50 MCG/ACT nasal spray Place 1 spray into both nostrils daily.     Marland Kitchen loperamide (IMODIUM A-D) 2 MG tablet Take 2 mg by mouth as needed for diarrhea or loose stools.    . Magnesium 500 MG TABS Take 1 tablet by mouth daily.    . Multiple Vitamins-Minerals (MULTIVITAMIN PO) Take 1 tablet by mouth daily.    . Omega-3 Fatty Acids (FISH OIL) 1000 MG CAPS Take 1,000 mg by mouth daily.     .  pantoprazole (PROTONIX) 40 MG tablet Take 40 mg by mouth daily.    . pravastatin (PRAVACHOL) 40 MG tablet Take 1 tablet (40 mg total) by mouth at bedtime. 90 tablet 1  . valsartan (DIOVAN) 320 MG tablet Take 0.5 tablets (160 mg total) by mouth 2 (two) times daily. 180 tablet 1  . vitamin B-12 (CYANOCOBALAMIN) 1000 MCG tablet Take 2,000 mcg by mouth every evening.     Alveda Reasons 20 MG TABS tablet TAKE 1 TABLET BY MOUTH  DAILY WITH SUPPER 90 tablet 1   No current facility-administered medications for this visit.     Allergies:   Nsaids; Penicillins; Tolmetin; Zoster vaccine live; and Amoxicillin   Social History:  The patient  reports that she quit smoking about 38 years ago. She has never used smokeless tobacco. She reports current alcohol use. She reports that she does not use drugs.   Family History:  The patient's  family history includes Breast cancer in her mother; Colon cancer in her mother; Diabetes in her father; Head & neck cancer in her sister; Hypertension in her father and mother; Ovarian cancer in her mother.   ROS:  Please see the history of present illness.   All other systems are personally reviewed and negative.    Exam:    Vital Signs:  BP (!) 108/43   Pulse (!) 53   Well appearing, alert and conversant, regular work of breathing,  good skin color Eyes- anicteric, neuro- grossly intact, skin- no apparent rash or lesions or cyanosis, mouth- oral mucosa is pink   Labs/Other Tests and Data Reviewed:    Recent Labs: 05/23/2017: ALT 36; TSH 1.06 07/14/2017: Magnesium 2.3 10/18/2017: BUN 6; Creatinine, Ser 0.67; Potassium 3.9; Sodium 137 12/06/2017: Hemoglobin 13.6; Platelets 227.0   Wt Readings from Last 3 Encounters:  01/04/18 252 lb 9.6 oz (114.6 kg)  01/01/18 245 lb (111.1 kg)  12/13/17 249 lb 3.2 oz (113 kg)     Other studies personally reviewed: Additional studies/ records that were reviewed today include: my prior office notes  Review of the above records  today demonstrates: as above Prior radiographs: cardiac CT 08/28/17- calcium score  Is 71%    ASSESSMENT & PLAN:    1.  Paroxysmal atrial fibrillation/ atrial flutter She has short episodes of palpitations which are rare.  She takes flecainide for this. I have advised ILR.  She will consider this. I would not advise 30 day monitors in the future. She is on xarelto  2. OSA She is compliant with CPAP Followed by Dr Maxwell Caul and optimized per patient  3. HTN Stable No change required today  4. Overweight Lifestyle modification is encouraged  5. COVID 19 screen The patient denies symptoms of COVID 19 at this time.  The importance of social distancing was discussed today.  Follow-up:  Return to see me in 6 months  Current medicines are reviewed at length with the patient today.   The patient does not have concerns regarding her medicines.  The following changes were made today:  none  Labs/ tests ordered today include:  No orders of the defined types were placed in this encounter.   Patient Risk:  after full review of this patients clinical status, I feel that they are at moderate risk at this time.  Today, I have spent 15 minutes with the patient with telehealth technology discussing afib .    Army Fossa, MD  04/29/2018 9:54 AM     [address] [place]  [phone] (office) [phone] (fax)

## 2018-05-03 NOTE — Progress Notes (Deleted)
Virtual Visit via Video Note  I connected with patient on 05/06/18 at 10:00 AM EDT by a video enabled telemedicine application and verified that I am speaking with the correct person using two identifiers.   THIS ENCOUNTER IS A VIRTUAL VISIT DUE TO COVID-19 - PATIENT WAS NOT SEEN IN THE OFFICE. PATIENT HAS CONSENTED TO VIRTUAL VISIT / TELEMEDICINE VISIT   Location of patient: home  Location of provider: office  I discussed the limitations of evaluation and management by telemedicine and the availability of in person appointments. The patient expressed understanding and agreed to proceed.   Subjective:   Veronica Bradford is a 73 y.o. female who presents for Medicare Annual (Subsequent) preventive examination.  Review of Systems: No ROS.  Medicare Wellness Virtual Visit. UTA vital signs.  Additional risk factors are reflected in the social history.   Sleep patterns:   Home Safety/Smoke Alarms: Feels safe in home. Smoke alarms in place.     Female:     Mammo- 05/04/17      Dexa scan-  05/03/17      CCS- 11/03/16 per abstract entry.     Objective:     Vitals: Unable to assess. This visit is enabled though telemedicine due to Covid 19.   Advanced Directives 10/18/2017 09/04/2017 05/03/2017 12/13/2016 05/02/2016 04/06/2015  Does Patient Have a Medical Advance Directive? No Yes Yes Yes Yes Yes  Type of Advance Directive - [place];Living will [place];Living will [place];Living will [place];Living will [place];Living will  Does patient want to make changes to medical advance directive? - No - Patient declined No - Patient declined - - No - Patient declined  Copy of [place] in Chart? - No - copy requested No - copy requested - No - copy requested -    Tobacco Social History   Tobacco Use  Smoking Status Former Smoker  . Last attempt to quit: 01/06/1980  . Years since  quitting: 38.3  Smokeless Tobacco Never Used  Tobacco Comment   smoked from 1970 to 1982, less than 1 ppd     Counseling given: Not Answered Comment: smoked from [place] to 1982, less than 1 ppd   Clinical Intake:                       Past Medical History:  Diagnosis Date  . Back pain   . Chronic sinusitis    s/p 2 surgeries remotely, [name]  . Closed fracture of metatarsal of left foot    L foot, fifth metatarsal  . Ectopic pregnancy   . Endometrial cancer ([place]) 2004   Early diagnosis  . GERD (gastroesophageal reflux disease)   . Hyperlipemia   . Hypertension   . IBS (irritable bowel syndrome) 08/13/2014   Dx 2015, diarrhea on-off, Dr [name]   . Lumbar radiculopathy 08/2012   MRI in 08/2012  . Mild depression ([place])    Pt's son has Down's Syndrome  . Mild hyperparathyroidism ([place])   . Osteopenia   . PAF (paroxysmal atrial fibrillation) ([place])   . Prediabetes   . Sciatica of right side 08/2013   Received Depomedrol 80 mg injection  . Sleep apnea 09/09/2013   Dx with OSA in 2015, by Dr. Caryl Comes, APAP   Past Surgical History:  Procedure Laterality Date  . ABDOMINAL HYSTERECTOMY    . ATRIAL FIBRILLATION ABLATION N/A 09/04/2017   Procedure: ATRIAL FIBRILLATION  ABLATION;  Surgeon: [name], MD;  Location: [facility];  Service: Cardiovascular;  Laterality: N/A;  . CHOLECYSTECTOMY  ~2006  . DILATION AND CURETTAGE OF UTERUS     after SAB  . ESOPHAGOGASTRODUODENOSCOPY  02/09/2014   Dr. [name]  . FINGER SURGERY Left    index  . NASAL SINUS SURGERY     x 2 remotely, [name]  . TONSILLECTOMY    . TOTAL ABDOMINAL HYSTERECTOMY W/ BILATERAL SALPINGOOPHORECTOMY    . TOTAL KNEE ARTHROPLASTY Right ~2008   Family History  Problem Relation Age of Onset  . Hypertension Mother   . Colon cancer Mother   . Breast cancer Mother   . Ovarian cancer Mother   . Hypertension Father   . Diabetes Father   . Head & neck cancer Sister   . CAD Neg Hx    Social  History   Socioeconomic History  . Marital status: Married    Spouse name: Not on file  . Number of children: 1  . Years of education: Not on file  . Highest education level: Not on file  Occupational History  . 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 Financial resource strain: Not on file  . Food insecurity:    Worry: Not on file    Inability: Not on file  . Transportation needs:    Medical: Not on file    Non-medical: Not on file  Tobacco Use  . Smoking status: Former Smoker    Last attempt to quit: 01/06/1980    Years since quitting: 38.3  . Smokeless tobacco: Never Used  . Tobacco comment: smoked from 1970 to 1982, less than 1 ppd  Substance and Sexual Activity  . Alcohol use: Yes    Comment: rare  . Drug use: No  . Sexual activity: Not Currently    Birth control/protection: None  Lifestyle  . Physical activity:    Days per week: Not on file    Minutes per session: Not on file  . Stress: Not on file  Relationships  . Social connections:    Talks on phone: Not on file    Gets together: Not on file    Attends religious service: Not on file    Active member of club or organization: Not on file    Attends meetings of clubs or organizations: Not on file    Relationship status: Not on file  Other Topics Concern  . Not on file  Social History Narrative   Lives w/ husband, and son [name]   Retired Marine scientist   Lives in [place]    Outpatient Encounter Medications as of 05/06/2018  Medication Sig  . acetaminophen-codeine (TYLENOL #3) 300-30 MG tablet TAKE 1 TABLET BY MOUTH DAILY AS NEEDED FOR MODERATE PAIN.  Marland Kitchen Cholecalciferol (VITAMIN D) 2000 units CAPS Take 2,000 Units by mouth daily.   . Chromium Picolinate 1000 MCG TABS Take 1,000 mcg by mouth every evening.  . clindamycin (CLEOCIN) 150 MG capsule Take 1 capsule by mouth 3 (three) times daily.  . Coenzyme Q10 (CO Q 10) 100 MG CAPS Take 100 mg by mouth daily.   Marland Kitchen diltiazem (CARDIZEM) 30 MG tablet Take 1 tablet  every 4 hours AS NEEDED for heart rate >100 as long as blood pressure >100.  Marland Kitchen Flaxseed, Linseed, (FLAXSEED OIL) 1000 MG CAPS Take 1 capsule by mouth daily.  . flecainide (TAMBOCOR) 150 MG tablet Take 2 tablets (300 mg total) by mouth as needed (FOR AFIB). Pill  in a pocket  . fluticasone (FLONASE) 50 MCG/ACT nasal spray Place 1 spray into both nostrils daily.   Marland Kitchen loperamide (IMODIUM A-D) 2 MG tablet Take 2 mg by mouth as needed for diarrhea or loose stools.  . Magnesium 500 MG TABS Take 1 tablet by mouth daily.  . Multiple Vitamins-Minerals (MULTIVITAMIN PO) Take 1 tablet by mouth daily.  . Omega-3 Fatty Acids (FISH OIL) 1000 MG CAPS Take 1,000 mg by mouth daily.   . pantoprazole (PROTONIX) 40 MG tablet Take 40 mg by mouth daily.  . pravastatin (PRAVACHOL) 40 MG tablet Take 1 tablet (40 mg total) by mouth at bedtime.  . valsartan (DIOVAN) 320 MG tablet Take 0.5 tablets (160 mg total) by mouth 2 (two) times daily.  . vitamin B-12 (CYANOCOBALAMIN) 1000 MCG tablet Take 2,000 mcg by mouth every evening.   Alveda Reasons 20 MG TABS tablet TAKE 1 TABLET BY MOUTH  DAILY WITH SUPPER   No facility-administered encounter medications on file as of 05/06/2018.     Activities of Daily Living In your present state of health, do you have any difficulty performing the following activities: 09/04/2017 05/03/2017  Hearing? N N  Vision? N N  Comment - wears glasses  Difficulty concentrating or making decisions? N N  Walking or climbing stairs? N N  Dressing or bathing? N N  Doing errands, shopping? N N  Preparing Food and eating ? - N  Using the Toilet? - N  In the past six months, have you accidently leaked urine? - N  Do you have problems with loss of bowel control? - N  Managing your Medications? - N  Managing your Finances? - N  Housekeeping or managing your Housekeeping? - N  Some recent data might be hidden    Patient Care Team: Colon Branch, MD as PCP - General (Internal Medicine) Marius Ditch, MD  as Consulting Physician (Internal Medicine) [name], MD as Consulting Physician (Cardiology) Wylene Simmer, MD as Consulting Physician (Orthopedic Surgery) Armandina Gemma, MD as Consulting Physician (General Surgery)    Assessment:   This is a routine wellness examination for [place]. Physical assessment deferred to PCP.  Exercise Activities and Dietary recommendations   Diet (meal preparation, eat out, water intake, caffeinated beverages, dairy products, fruits and vegetables): {Desc; diets:16563} Breakfast: Lunch:  Dinner:      Goals    . Weight (lb) < 225 lb (102.1 kg) (pt-stated)     With diet and exercise        Fall Risk Fall Risk  05/03/2017 05/02/2016 10/19/2015 04/14/2015 08/13/2014  Falls in the past year? No No No No No    Depression Screen PHQ 2/9 Scores 05/03/2017 05/02/2016 10/19/2015 04/14/2015  PHQ - 2 Score 0 0 0 0     Cognitive Function Ad8 score reviewed for issues:  Issues making decisions:  Less interest in hobbies / activities:  Repeats questions, stories (family complaining):  Trouble using ordinary gadgets (microwave, computer, phone):  Forgets the month or year:   Mismanaging finances:   Remembering appts:  Daily problems with thinking and/or memory: Ad8 score is=            Immunization History  Administered Date(s) Administered  . Influenza, High Dose Seasonal PF 10/01/2014, 10/19/2015, 11/10/2016, 10/03/2017  . Influenza-Unspecified 10/19/2010, 09/09/2013  . Pneumococcal Conjugate-13 03/07/2013  . Pneumococcal Polysaccharide-23 09/09/2013  . Tdap 07/26/2011  . Zoster 11/12/2012   Screening Tests Health Maintenance  Topic Date Due  . MAMMOGRAM  05/04/2018  . DEXA SCAN  05/04/2018  . INFLUENZA VACCINE  08/03/2018  . TETANUS/TDAP  07/25/2021  . COLONOSCOPY  11/03/2021  . Hepatitis C Screening  Completed  . PNA vac Low Risk Adult  Completed     Plan:   ***   I have personally reviewed and noted the following in the  patient's chart:   . Medical and social history . Use of alcohol, tobacco or illicit drugs  . Current medications and supplements . Functional ability and status . Nutritional status . Physical activity . Advanced directives . List of other physicians . Hospitalizations, surgeries, and ER visits in previous 12 months . Vitals . Screenings to include cognitive, depression, and falls . Referrals and appointments  In addition, I have reviewed and discussed with patient certain preventive protocols, quality metrics, and best practice recommendations. A written personalized care plan for preventive services as well as general preventive health recommendations were provided to patient.     Shela Nevin, [place]  05/03/2018

## 2018-05-06 ENCOUNTER — Ambulatory Visit (INDEPENDENT_AMBULATORY_CARE_PROVIDER_SITE_OTHER): Payer: Medicare Other | Admitting: *Deleted

## 2018-05-06 ENCOUNTER — Encounter: Payer: Self-pay | Admitting: *Deleted

## 2018-05-06 ENCOUNTER — Ambulatory Visit: Payer: Medicare Other | Admitting: *Deleted

## 2018-05-06 DIAGNOSIS — Z0000 Encounter for general adult medical examination without abnormal findings: Secondary | ICD-10-CM | POA: Diagnosis not present

## 2018-05-06 NOTE — Progress Notes (Addendum)
Virtual Visit via Video Note  I connected with patient on 05/06/18 at 10:00 AM EDT by a video enabled telemedicine application and verified that I am speaking with the correct person using two identifiers.   THIS ENCOUNTER IS A VIRTUAL VISIT DUE TO COVID-19 - PATIENT WAS NOT SEEN IN THE OFFICE. PATIENT HAS CONSENTED TO VIRTUAL VISIT / TELEMEDICINE VISIT   Location of patient: home  Location of provider: office  I discussed the limitations of evaluation and management by telemedicine and the availability of in person appointments. The patient expressed understanding and agreed to proceed.   Subjective:   Veronica Bradford is a 73 y.o. female who presents for Medicare Annual (Subsequent) preventive examination.   Pt is retired Therapist, sports. Sewing masks for friends for covid.  Review of Systems: No ROS.  Medicare Wellness Virtual Visit. UTA vital signs.  Additional risk factors are reflected in the social history. Cardiac Risk Factors include: advanced age (>42men, >30 women);hypertension Sleep patterns: still only sleeps 4-6 hrs  Home Safety/Smoke Alarms: Feels safe in home. Smoke alarms in place.  Lives with husband and son on 1st floor of 2 story home.   Female:       Mammo-   Pt will sched after pandemic is over    Dexa scan- 05/03/17        CCS- 11/03/16. Recall 5 yrs per      Objective:     Vitals: Unable to assess. This visit is enabled though telemedicine due to Covid 19. Pt reports BP 116/49, HR=61bpm.  Advanced Directives 05/06/2018 10/18/2017 09/04/2017 05/03/2017 12/13/2016 05/02/2016 04/06/2015  Does Patient Have a Medical Advance Directive? No No Yes Yes Yes Yes Yes  Type of Advance Directive - Public librarian;Living will Veronica Bradford;Living will [place];Living will [place];Living will [place];Living will  Does patient want to make changes to medical advance directive? - - No - Patient declined  No - Patient declined - - No - Patient declined  Copy of [place] in Chart? - - No - copy requested No - copy requested - No - copy requested -  Would patient like information on creating a medical advance directive? No - Patient declined - - - - - -    Tobacco Social History   Tobacco Use  Smoking Status Former Smoker  . Last attempt to quit: 01/06/1980  . Years since quitting: 38.3  Smokeless Tobacco Never Used  Tobacco Comment   smoked from 1970 to 1982, less than 1 ppd     Counseling given: Not Answered Comment: smoked from [place] to 1982, less than 1 ppd   Clinical Intake:     Pain : No/denies pain     Past Medical History:  Diagnosis Date  . Back pain   . Chronic sinusitis    s/p 2 surgeries remotely, [name]  . Closed fracture of metatarsal of left foot    L foot, fifth metatarsal  . Ectopic pregnancy   . Endometrial cancer ([place]) 2004   Early diagnosis  . GERD (gastroesophageal reflux disease)   . Hyperlipemia   . Hypertension   . IBS (irritable bowel syndrome) 08/13/2014   Dx 2015, diarrhea on-off, Dr [name]   . Lumbar radiculopathy 08/2012   MRI in 08/2012  . Mild depression ([place])    Pt's son has Down's Syndrome  . Mild hyperparathyroidism ([place])   . Osteopenia   . PAF (paroxysmal  atrial fibrillation) ([place])   . Prediabetes   . Sciatica of right side 08/2013   Received Depomedrol 80 mg injection  . Sleep apnea 09/09/2013   Dx with OSA in 2015, by Dr. Caryl Comes, APAP   Past Surgical History:  Procedure Laterality Date  . ABDOMINAL HYSTERECTOMY    . ATRIAL FIBRILLATION ABLATION N/A 09/04/2017   Procedure: ATRIAL FIBRILLATION ABLATION;  Surgeon: [name], MD;  Location: [facility];  Service: Cardiovascular;  Laterality: N/A;  . CHOLECYSTECTOMY  ~2006  . DILATION AND CURETTAGE OF UTERUS     after SAB  . ESOPHAGOGASTRODUODENOSCOPY  02/09/2014   Dr. [name]  . FINGER SURGERY Left    index  . NASAL SINUS SURGERY     x 2 remotely,  [name]  . TONSILLECTOMY    . TOTAL ABDOMINAL HYSTERECTOMY W/ BILATERAL SALPINGOOPHORECTOMY    . TOTAL KNEE ARTHROPLASTY Right ~2008   Family History  Problem Relation Age of Onset  . Hypertension Mother   . Colon cancer Mother   . Breast cancer Mother   . Ovarian cancer Mother   . Hypertension Father   . Diabetes Father   . Head & neck cancer Sister   . CAD Neg Hx    Social History   Socioeconomic History  . Marital status: Married    Spouse name: Not on file  . Number of children: 1  . Years of education: Not on file  . Highest education level: Not on file  Occupational History  . 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 Financial resource strain: Not on file  . Food insecurity:    Worry: Not on file    Inability: Not on file  . Transportation needs:    Medical: Not on file    Non-medical: Not on file  Tobacco Use  . Smoking status: Former Smoker    Last attempt to quit: 01/06/1980    Years since quitting: 38.3  . Smokeless tobacco: Never Used  . Tobacco comment: smoked from 1970 to 1982, less than 1 ppd  Substance and Sexual Activity  . Alcohol use: Yes    Comment: rare  . Drug use: No  . Sexual activity: Not Currently    Birth control/protection: None  Lifestyle  . Physical activity:    Days per week: Not on file    Minutes per session: Not on file  . Stress: Not on file  Relationships  . Social connections:    Talks on phone: Not on file    Gets together: Not on file    Attends religious service: Not on file    Active member of club or organization: Not on file    Attends meetings of clubs or organizations: Not on file    Relationship status: Not on file  Other Topics Concern  . Not on file  Social History Narrative   Lives w/ husband, and son Veronica Bradford   Retired Marine scientist   Lives in [place]    Outpatient Encounter Medications as of 05/06/2018  Medication Sig  . acetaminophen-codeine (TYLENOL #3) 300-30 MG tablet TAKE 1 TABLET BY MOUTH DAILY  AS NEEDED FOR MODERATE PAIN.  Marland Kitchen Cholecalciferol (VITAMIN D) 2000 units CAPS Take 2,000 Units by mouth daily.   . clindamycin (CLEOCIN) 150 MG capsule Take 1 capsule by mouth 3 (three) times daily.  . Coenzyme Q10 (CO Q 10) 100 MG CAPS Take 100 mg by mouth daily.   Marland Kitchen diltiazem (CARDIZEM) 30 MG tablet Take 1  tablet every 4 hours AS NEEDED for heart rate >100 as long as blood pressure >100.  Marland Kitchen Flaxseed, Linseed, (FLAXSEED OIL) 1000 MG CAPS Take 1 capsule by mouth daily.  . flecainide (TAMBOCOR) 150 MG tablet Take 2 tablets (300 mg total) by mouth as needed (FOR AFIB). Pill in a pocket  . fluticasone (FLONASE) 50 MCG/ACT nasal spray Place 1 spray into both nostrils daily.   . Magnesium 500 MG TABS Take 1 tablet by mouth daily.  . Multiple Vitamins-Minerals (MULTIVITAMIN PO) Take 1 tablet by mouth daily.  . Omega-3 Fatty Acids (FISH OIL) 1000 MG CAPS Take 1,000 mg by mouth daily.   . pantoprazole (PROTONIX) 40 MG tablet Take 40 mg by mouth daily.  . pravastatin (PRAVACHOL) 40 MG tablet Take 1 tablet (40 mg total) by mouth at bedtime.  . valsartan (DIOVAN) 320 MG tablet Take 0.5 tablets (160 mg total) by mouth 2 (two) times daily.  . vitamin B-12 (CYANOCOBALAMIN) 1000 MCG tablet Take 2,000 mcg by mouth every evening.   Alveda Reasons 20 MG TABS tablet TAKE 1 TABLET BY MOUTH  DAILY WITH SUPPER  . Chromium Picolinate 1000 MCG TABS Take 1,000 mcg by mouth every evening.  . loperamide (IMODIUM A-D) 2 MG tablet Take 2 mg by mouth as needed for diarrhea or loose stools.   No facility-administered encounter medications on file as of 05/06/2018.     Activities of Daily Living In your present state of health, do you have any difficulty performing the following activities: 05/06/2018 09/04/2017  Hearing? N N  Vision? N N  Difficulty concentrating or making decisions? N N  Walking or climbing stairs? N N  Dressing or bathing? N N  Doing errands, shopping? N N  Preparing Food and eating ? N -  Using the Toilet? N  -  In the past six months, have you accidently leaked urine? N -  Do you have problems with loss of bowel control? N -  Managing your Medications? N -  Managing your Finances? N -  Housekeeping or managing your Housekeeping? N -  Some recent data might be hidden    Patient Care Team: Colon Branch, MD as PCP - General (Internal Medicine) Marius Ditch, MD as Consulting Physician (Internal Medicine) [name], MD as Consulting Physician (Cardiology) Wylene Simmer, MD as Consulting Physician (Orthopedic Surgery) Armandina Gemma, MD as Consulting Physician (General Surgery)    Assessment:   This is a routine wellness examination for [place]. Physical assessment deferred to PCP.   Exercise Activities and Dietary recommendations Current Exercise Habits: Home exercise routine, Type of exercise: walking, Time (Minutes): 20, Frequency (Times/Week): 7, Weekly Exercise (Minutes/Week): 140, Intensity: Mild, Exercise limited by: None identified   Diet (meal preparation, eat out, water intake, caffeinated beverages, dairy products, fruits and vegetables): well balanced, on average, 3 meals per day   Goals    . Weight (lb) < 225 lb (102.1 kg) (pt-stated)     With diet and exercise        Fall Risk Fall Risk  05/06/2018 05/03/2017 05/02/2016 10/19/2015 04/14/2015  Falls in the past year? 0 No No No No     Depression Screen PHQ 2/9 Scores 05/06/2018 05/03/2017 05/02/2016 10/19/2015  PHQ - 2 Score 0 0 0 0     Cognitive Function Ad8 score reviewed for issues:  Issues making decisions:no  Less interest in hobbies / activities:no  Repeats questions, stories (family complaining):no  Trouble using ordinary gadgets (microwave, computer, phone):no  Forgets the month or year: no  Mismanaging finances: no  Remembering appts:no  Daily problems with thinking and/or memory:no Ad8 score is=0         Immunization History  Administered Date(s) Administered  . Influenza, High Dose Seasonal  PF 10/01/2014, 10/19/2015, 11/10/2016, 10/03/2017  . Influenza-Unspecified 10/19/2010, 09/09/2013  . Pneumococcal Conjugate-13 03/07/2013  . Pneumococcal Polysaccharide-23 09/09/2013  . Tdap 07/26/2011  . Zoster 11/12/2012    Screening Tests Health Maintenance  Topic Date Due  . MAMMOGRAM  05/04/2018  . DEXA SCAN  05/04/2018  . INFLUENZA VACCINE  08/03/2018  . TETANUS/TDAP  07/25/2021  . COLONOSCOPY  11/03/2021  . Hepatitis C Screening  Completed  . PNA vac Low Risk Adult  Completed       Plan:   See you next year.  Continue to eat heart healthy diet (full of fruits, vegetables, whole grains, lean protein, water--limit salt, fat, and sugar intake) and increase physical activity as tolerated.  Continue doing brain stimulating activities (puzzles, reading, adult coloring books, staying active) to keep memory sharp.   Bring a copy of your living will and/or healthcare power of attorney to your next office visit.    I have personally reviewed and noted the following in the patient's chart:   . Medical and social history . Use of alcohol, tobacco or illicit drugs  . Current medications and supplements . Functional ability and status . Nutritional status . Physical activity . Advanced directives . List of other physicians . Hospitalizations, surgeries, and ER visits in previous 12 months . Vitals . Screenings to include cognitive, depression, and falls . Referrals and appointments  In addition, I have reviewed and discussed with patient certain preventive protocols, quality metrics, and best practice recommendations. A written personalized care plan for preventive services as well as general preventive health recommendations were provided to patient.     Naaman Plummer [place], [place]  05/06/2018   Kathlene November, MD

## 2018-05-06 NOTE — Patient Instructions (Signed)
See you next year.  Continue to eat heart healthy diet (full of fruits, vegetables, whole grains, lean protein, water--limit salt, fat, and sugar intake) and increase physical activity as tolerated.  Continue doing brain stimulating activities (puzzles, reading, adult coloring books, staying active) to keep memory sharp.   Bring a copy of your living will and/or healthcare power of attorney to your next office visit.   [name] , Thank you for taking time to come for your Medicare Wellness Visit. I appreciate your ongoing commitment to your health goals. Please review the following plan we discussed and let me know if I can assist you in the future.   These are the goals we discussed: Goals    . Weight (lb) < 225 lb (102.1 kg) (pt-stated)     With diet and exercise        This is a list of the screening recommended for you and due dates:  Health Maintenance  Topic Date Due  . Mammogram  05/04/2018  . DEXA scan (bone density measurement)  05/04/2018  . Flu Shot  08/03/2018  . Tetanus Vaccine  07/25/2021  . Colon Cancer Screening  11/03/2021  .  Hepatitis C: One time screening is recommended by Center for Disease Control  (CDC) for  adults born from [birth year] through 1963.   Completed  . Pneumonia vaccines  Completed    Health Maintenance After Age 35 After age 70, you are at a higher risk for certain long-term diseases and infections as well as injuries from falls. Falls are a major cause of broken bones and head injuries in people who are older than age 46. Getting regular preventive care can help to keep you healthy and well. Preventive care includes getting regular testing and making lifestyle changes as recommended by your health care provider. Talk with your health care provider about:  Which screenings and tests you should have. A screening is a test that checks for a disease when you have no symptoms.  A diet and exercise plan that is right for you. What should I know about  screenings and tests to prevent falls? Screening and testing are the best ways to find a health problem early. Early diagnosis and treatment give you the best chance of managing medical conditions that are common after age 69. Certain conditions and lifestyle choices may make you more likely to have a fall. Your health care provider may recommend:  Regular vision checks. Poor vision and conditions such as cataracts can make you more likely to have a fall. If you wear glasses, make sure to get your prescription updated if your vision changes.  Medicine review. Work with your health care provider to regularly review all of the medicines you are taking, including over-the-counter medicines. Ask your health care provider about any side effects that may make you more likely to have a fall. Tell your health care provider if any medicines that you take make you feel dizzy or sleepy.  Osteoporosis screening. Osteoporosis is a condition that causes the bones to get weaker. This can make the bones weak and cause them to break more easily.  Blood pressure screening. Blood pressure changes and medicines to control blood pressure can make you feel dizzy.  Strength and balance checks. Your health care provider may recommend certain tests to check your strength and balance while standing, walking, or changing positions.  Foot health exam. Foot pain and numbness, as well as not wearing proper footwear, can make you more likely  to have a fall.  Depression screening. You may be more likely to have a fall if you have a fear of falling, feel emotionally low, or feel unable to do activities that you used to do.  Alcohol use screening. Using too much alcohol can affect your balance and may make you more likely to have a fall. What actions can I take to lower my risk of falls? General instructions  Talk with your health care provider about your risks for falling. Tell your health care provider if: ? You fall. Be sure  to tell your health care provider about all falls, even ones that seem minor. ? You feel dizzy, sleepy, or off-balance.  Take over-the-counter and prescription medicines only as told by your health care provider. These include any supplements.  Eat a healthy diet and maintain a healthy weight. A healthy diet includes low-fat dairy products, low-fat (lean) meats, and fiber from whole grains, beans, and lots of fruits and vegetables. Home safety  Remove any tripping hazards, such as rugs, cords, and clutter.  Install safety equipment such as grab bars in bathrooms and safety rails on stairs.  Keep rooms and walkways well-lit. Activity   Follow a regular exercise program to stay fit. This will help you maintain your balance. Ask your health care provider what types of exercise are appropriate for you.  If you need a cane or walker, use it as recommended by your health care provider.  Wear supportive shoes that have nonskid soles. Lifestyle  Do not drink alcohol if your health care provider tells you not to drink.  If you drink alcohol, limit how much you have: ? 0-1 drink a day for women. ? 0-2 drinks a day for men.  Be aware of how much alcohol is in your drink. In the [place] one drink equals one typical bottle of beer (12 oz), one-half glass of wine (5 oz), or one shot of hard liquor (1 oz).  Do not use any products that contain nicotine or tobacco, such as cigarettes and e-cigarettes. If you need help quitting, ask your health care provider. Summary  Having a healthy lifestyle and getting preventive care can help to protect your health and wellness after age 67.  Screening and testing are the best way to find a health problem early and help you avoid having a fall. Early diagnosis and treatment give you the best chance for managing medical conditions that are more common for people who are older than age 36.  Falls are a major cause of broken bones and head injuries in people  who are older than age 17. Take precautions to prevent a fall at home.  Work with your health care provider to learn what changes you can make to improve your health and wellness and to prevent falls. This information is not intended to replace advice given to you by your health care provider. Make sure you discuss any questions you have with your health care provider. Document Released: 11/01/2016 Document Revised: 11/01/2016 Document Reviewed: 11/01/2016 Elsevier Interactive Patient Education  2019 [employer].

## 2018-05-14 DIAGNOSIS — H2512 Age-related nuclear cataract, left eye: Secondary | ICD-10-CM | POA: Diagnosis not present

## 2018-05-14 DIAGNOSIS — H35373 Puckering of macula, bilateral: Secondary | ICD-10-CM | POA: Diagnosis not present

## 2018-05-14 DIAGNOSIS — H18413 Arcus senilis, bilateral: Secondary | ICD-10-CM | POA: Diagnosis not present

## 2018-05-14 DIAGNOSIS — H2513 Age-related nuclear cataract, bilateral: Secondary | ICD-10-CM | POA: Diagnosis not present

## 2018-05-14 DIAGNOSIS — H25043 Posterior subcapsular polar age-related cataract, bilateral: Secondary | ICD-10-CM | POA: Diagnosis not present

## 2018-05-28 ENCOUNTER — Other Ambulatory Visit: Payer: Self-pay

## 2018-05-28 ENCOUNTER — Ambulatory Visit (INDEPENDENT_AMBULATORY_CARE_PROVIDER_SITE_OTHER): Payer: Medicare Other | Admitting: Internal Medicine

## 2018-05-28 DIAGNOSIS — E785 Hyperlipidemia, unspecified: Secondary | ICD-10-CM

## 2018-05-28 DIAGNOSIS — M545 Low back pain, unspecified: Secondary | ICD-10-CM

## 2018-05-28 DIAGNOSIS — K580 Irritable bowel syndrome with diarrhea: Secondary | ICD-10-CM

## 2018-05-28 DIAGNOSIS — R739 Hyperglycemia, unspecified: Secondary | ICD-10-CM | POA: Diagnosis not present

## 2018-05-28 DIAGNOSIS — I480 Paroxysmal atrial fibrillation: Secondary | ICD-10-CM

## 2018-05-28 DIAGNOSIS — I10 Essential (primary) hypertension: Secondary | ICD-10-CM

## 2018-05-28 DIAGNOSIS — G8929 Other chronic pain: Secondary | ICD-10-CM

## 2018-05-28 DIAGNOSIS — E213 Hyperparathyroidism, unspecified: Secondary | ICD-10-CM

## 2018-05-28 MED ORDER — ACETAMINOPHEN-CODEINE #3 300-30 MG PO TABS: 1.0000 | ORAL_TABLET | Freq: Every day | ORAL | 0 refills | Status: DC | PRN

## 2018-05-28 MED FILL — ACETAMINOPHEN/COD #3 TABLET: 300-30 | 30 days supply | Qty: 30 | Fill #0

## 2018-05-28 NOTE — Progress Notes (Signed)
Subjective:    Patient ID: Veronica Bradford, female    DOB: 20-Jan-1945, 73 y.o.   MRN: [id number]  DOS:  05/28/2018 Type of visit - description: Virtual Visit via Video Note  I connected with@ on 05/28/18 at 10:00 AM EDT by a video enabled telemedicine application and verified that I am speaking with the correct person using two identifiers.   THIS ENCOUNTER IS A VIRTUAL VISIT DUE TO COVID-19 - PATIENT WAS NOT SEEN IN THE OFFICE. PATIENT HAS CONSENTED TO VIRTUAL VISIT / TELEMEDICINE VISIT   Location of patient: home  Location of provider: office  I discussed the limitations of evaluation and management by telemedicine and the availability of in person appointments. The patient expressed understanding and agreed to proceed.  History of Present Illness:  ROV HTN: Occasionally BPs are low, she is adjusting her medication as needed Having dental problems, on doxycycline for few weeks Back pain on and off, needs a refill on Tylenol #3 Pulmonary nodules: Pulmonary note reviewed PAF: Cardiology note reviewed Increase LFTs: Chart reviewed, patient concerned    Review of Systems Denies fever chills No chest pain or difficulty breathing Occasional diarrhea, she feels related to IBS.  No blood in the stools. Very good COVID-19 precautions.  She shelters at home & practice social distancing  Past Medical History:  Diagnosis Date  . Back pain   . Chronic sinusitis    s/p 2 surgeries remotely, [name]  . Closed fracture of metatarsal of left foot    L foot, fifth metatarsal  . Ectopic pregnancy   . Endometrial cancer ([place]) 2004   Early diagnosis  . GERD (gastroesophageal reflux disease)   . Hyperlipemia   . Hypertension   . IBS (irritable bowel syndrome) 08/13/2014   Dx 2015, diarrhea on-off, Dr [name]   . Lumbar radiculopathy 08/2012   MRI in 08/2012  . Mild depression ([place])    Pt's son has Down's Syndrome  . Mild hyperparathyroidism ([place])   . Osteopenia   . PAF (paroxysmal  atrial fibrillation) ([place])   . Prediabetes   . Sciatica of right side 08/2013   Received Depomedrol 80 mg injection  . Sleep apnea 09/09/2013   Dx with OSA in 2015, by Dr. Caryl Comes, APAP    Past Surgical History:  Procedure Laterality Date  . ABDOMINAL HYSTERECTOMY    . ATRIAL FIBRILLATION ABLATION N/A 09/04/2017   Procedure: ATRIAL FIBRILLATION ABLATION;  Surgeon: [name], MD;  Location: [facility];  Service: Cardiovascular;  Laterality: N/A;  . CHOLECYSTECTOMY  ~2006  . DILATION AND CURETTAGE OF UTERUS     after SAB  . ESOPHAGOGASTRODUODENOSCOPY  02/09/2014   Dr. [name]  . FINGER SURGERY Left    index  . NASAL SINUS SURGERY     x 2 remotely, [name]  . TONSILLECTOMY    . TOTAL ABDOMINAL HYSTERECTOMY W/ BILATERAL SALPINGOOPHORECTOMY    . TOTAL KNEE ARTHROPLASTY Right ~2008    Social History   Socioeconomic History  . Marital status: Married    Spouse name: Not on file  . Number of children: 1  . Years of education: Not on file  . Highest education level: Not on file  Occupational History  . 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 Financial resource strain: Not on file  . Food insecurity:    Worry: Not on file    Inability: Not on file  . Transportation needs:    Medical: Not on file  Non-medical: Not on file  Tobacco Use  . Smoking status: Former Smoker    Last attempt to quit: 01/06/1980    Years since quitting: 38.4  . Smokeless tobacco: Never Used  . Tobacco comment: smoked from 1970 to 1982, less than 1 ppd  Substance and Sexual Activity  . Alcohol use: Yes    Comment: rare  . Drug use: No  . Sexual activity: Not Currently    Birth control/protection: None  Lifestyle  . Physical activity:    Days per week: Not on file    Minutes per session: Not on file  . Stress: Not on file  Relationships  . Social connections:    Talks on phone: Not on file    Gets together: Not on file    Attends religious service: Not on file    Active  member of club or organization: Not on file    Attends meetings of clubs or organizations: Not on file    Relationship status: Not on file  . Intimate partner violence:    Fear of current or ex partner: Not on file    Emotionally abused: Not on file    Physically abused: Not on file    Forced sexual activity: Not on file  Other Topics Concern  . Not on file  Social History Narrative   Lives w/ husband, and son [name]   Retired Marine scientist   Lives in [place] as of 05/28/2018      Reactions   Nsaids Other (See Comments)   Pt on Xarelto   Penicillins    Local reaction   Tolmetin Other (See Comments)   Pt on Xarelto   Zoster Vaccine Live Other (See Comments)   Sickness per Pt.    Amoxicillin Rash      Medication List       Accurate as of May 28, 2018 10:05 AM. If you have any questions, ask your nurse or doctor.        acetaminophen-codeine 300-30 MG tablet Commonly known as:  TYLENOL #3 TAKE 1 TABLET BY MOUTH DAILY AS NEEDED FOR MODERATE PAIN.   Chromium Picolinate 1000 MCG Tabs Take 1,000 mcg by mouth every evening.   clindamycin 150 MG capsule Commonly known as:  CLEOCIN Take 1 capsule by mouth 3 (three) times daily.   Co Q 10 100 MG Caps Take 100 mg by mouth daily.   diltiazem 30 MG tablet Commonly known as:  Cardizem Take 1 tablet every 4 hours AS NEEDED for heart rate >100 as long as blood pressure >100.   Fish Oil 1000 MG Caps Take 1,000 mg by mouth daily.   Flaxseed Oil 1000 MG Caps Take 1 capsule by mouth daily.   flecainide 150 MG tablet Commonly known as:  TAMBOCOR Take 2 tablets (300 mg total) by mouth as needed (FOR AFIB). Pill in a pocket   fluticasone 50 MCG/ACT nasal spray Commonly known as:  FLONASE Place 1 spray into both nostrils daily.   loperamide 2 MG tablet Commonly known as:  IMODIUM A-D Take 2 mg by mouth as needed for diarrhea or loose stools.   Magnesium 500 MG Tabs Take 1 tablet by mouth daily.   MULTIVITAMIN  PO Take 1 tablet by mouth daily.   pantoprazole 40 MG tablet Commonly known as:  PROTONIX Take 40 mg by mouth daily.   pravastatin 40 MG tablet Commonly known as:  PRAVACHOL Take 1 tablet (40 mg total) by mouth  at bedtime.   valsartan 320 MG tablet Commonly known as:  DIOVAN Take 0.5 tablets (160 mg total) by mouth 2 (two) times daily.   vitamin B-12 1000 MCG tablet Commonly known as:  CYANOCOBALAMIN Take 2,000 mcg by mouth every evening.   Vitamin D 50 MCG (2000 UT) Caps Take 2,000 Units by mouth daily.   Xarelto 20 MG Tabs tablet Generic drug:  rivaroxaban TAKE 1 TABLET BY MOUTH  DAILY WITH SUPPER           Objective:   Physical Exam There were no vitals taken for this visit. This is a virtual video visit, she was able to see me but I have a problem seeing her in my screen.    Assessment      Assessment: Prediabetes HTN Hyperlipidemia Chronic lower extremity edema L>R (eval by previous pcp, related to varicose veins?) CV:  --Paroxysmal atrial fibrillation, a flutter: on xarelto flecainide-diltiazem  prn --Ablation 09/2017 GI: Dr [name]  --GERD (zantac or protonix prn), IBS, chronic diarrhea --GI records: [place], 2004, 2009 8 and 07-2013. Negative for polyps or IBD. + Diverticuli. Next 202 due to [place]. Had a EGD/cscope 11/2016 d -- IBS, Diarrhea --Increased LFTs: CT abdomen 05/2016: Normal liver. 05/2017: WNL  Hepatitis B and C, Alpha 1 antitrypsin, anti-smooth muscle antibody, ANA trans-ferritin  OSA--- CPAP, Dr. Maxwell Caul Osteopenia:  Multiple  Dexas, last two:  T score 2009  -1.1 T score 2015  -0.7 DJD --Chronic back pain-- tylenol #3 prn --- RF per PCP --history of epidurals in the past, MRI 2014: Scoliosis,DJD --CIPRO pre dental d/t knee replacement (rx by ortho) H/o endometrial cancer, found during a hysterectomy, no chemotherapy or XRT.  Primary hyperparathyroidism: Chronic hypercalcemia, increased PTH 05/2016, refered to ENT  PLAN:   Prediabetes: diet control. Check A1c HTN: Currently on Diovan, she takes half tablet twice a day, once or twice a month BPs are low and she skips half tablet as needed.  Check a CMP. Hyperlipidemia: On Pravachol, check a FLP Paroxysmal atrial fibrillation, a flutter, palpitations: Flecainide and verapamil as needed (typically uses once or twice a month), on Xarelto. IBS: Symptoms at baseline, diarrhea on average once a day.   Chronic back pain: Refill Tylenol 3 Primary hyperparathyroidism: Last visit with surgery 10-2017, felt to be stable, follow-up in 1 year LFTs elevated: Minimal, sporadic LFT elevation.  Recheck labs today.   Pulmonary nodules: Saw Dr. Melvyn Novas 12/06/2017, TB Gold, sed rate negative, next CT 08-2018 Plan: Labs this week  RTC CPX 4 months I discussed the assessment and treatment plan with the patient. The patient was provided an opportunity to ask questions and all were answered. The patient agreed with the plan and demonstrated an understanding of the instructions.   The patient was advised to call back or seek an in-person evaluation if the symptoms worsen or if the condition fails to improve as anticipated.

## 2018-05-29 ENCOUNTER — Other Ambulatory Visit: Payer: Medicare Other

## 2018-05-29 DIAGNOSIS — H35373 Puckering of macula, bilateral: Secondary | ICD-10-CM | POA: Diagnosis not present

## 2018-05-29 DIAGNOSIS — H18413 Arcus senilis, bilateral: Secondary | ICD-10-CM | POA: Diagnosis not present

## 2018-05-29 DIAGNOSIS — H2513 Age-related nuclear cataract, bilateral: Secondary | ICD-10-CM | POA: Diagnosis not present

## 2018-05-29 DIAGNOSIS — H25043 Posterior subcapsular polar age-related cataract, bilateral: Secondary | ICD-10-CM | POA: Diagnosis not present

## 2018-05-29 NOTE — Assessment & Plan Note (Signed)
Prediabetes: diet control. Check A1c HTN: Currently on Diovan, she takes half tablet twice a day, once or twice a month BPs are low and she skips half tablet as needed.  Check a CMP. Hyperlipidemia: On Pravachol, check a FLP Paroxysmal atrial fibrillation, a flutter, palpitations: Flecainide and verapamil as needed (typically uses once or twice a month), on Xarelto. IBS: Symptoms at baseline, diarrhea on average once a day.   Chronic back pain: Refill Tylenol 3 Primary hyperparathyroidism: Last visit with surgery 10-2017, felt to be stable, follow-up in 1 year LFTs elevated: Minimal, sporadic LFT elevation.  Recheck labs today.   Pulmonary nodules: Saw Dr. Melvyn Novas 12/06/2017, TB Gold, sed rate negative, next CT 08-2018 Plan: Labs this week  RTC CPX 4 months

## 2018-05-31 ENCOUNTER — Other Ambulatory Visit: Payer: Self-pay

## 2018-05-31 ENCOUNTER — Other Ambulatory Visit (INDEPENDENT_AMBULATORY_CARE_PROVIDER_SITE_OTHER): Payer: Medicare Other

## 2018-05-31 DIAGNOSIS — E785 Hyperlipidemia, unspecified: Secondary | ICD-10-CM

## 2018-05-31 DIAGNOSIS — I10 Essential (primary) hypertension: Secondary | ICD-10-CM

## 2018-05-31 DIAGNOSIS — R739 Hyperglycemia, unspecified: Secondary | ICD-10-CM | POA: Diagnosis not present

## 2018-05-31 LAB — LIPID PANEL
Cholesterol: 177 mg/dL (ref 0–200)
HDL: 61.7 mg/dL (ref >39.00)
LDL Cholesterol: 99 mg/dL (ref 0–99)
NonHDL: 114.85
Total CHOL/HDL Ratio: 3
Triglycerides: 81 mg/dL (ref 0.0–149.0)
VLDL: 16.2 mg/dL (ref 0.0–40.0)

## 2018-05-31 LAB — COMPREHENSIVE METABOLIC PANEL
ALT: 15 U/L (ref 0–35)
AST: 16 U/L (ref 0–37)
Albumin: 3.6 g/dL (ref 3.5–5.2)
Alkaline Phosphatase: 67 U/L (ref 39–117)
BUN: 15 mg/dL (ref 6–23)
CO2: 28 mEq/L (ref 19–32)
Calcium: 9.9 mg/dL (ref 8.4–10.5)
Chloride: 107 mEq/L (ref 96–112)
Creatinine, Ser: 0.65 mg/dL (ref 0.40–1.20)
GFR: 90 mL/min (ref >60.00)
Glucose, Bld: 85 mg/dL (ref 70–99)
Potassium: 4.3 mEq/L (ref 3.5–5.1)
Sodium: 142 mEq/L (ref 135–145)
Total Bilirubin: 0.7 mg/dL (ref 0.2–1.2)
Total Protein: 5.8 g/dL — ABNORMAL LOW (ref 6.0–8.3)

## 2018-05-31 LAB — HEMOGLOBIN A1C: Hgb A1c MFr Bld: 5.7 % (ref 4.6–6.5)

## 2018-06-15 DIAGNOSIS — H2512 Age-related nuclear cataract, left eye: Secondary | ICD-10-CM | POA: Diagnosis not present

## 2018-06-18 DIAGNOSIS — H25812 Combined forms of age-related cataract, left eye: Secondary | ICD-10-CM | POA: Diagnosis not present

## 2018-06-18 DIAGNOSIS — H2512 Age-related nuclear cataract, left eye: Secondary | ICD-10-CM | POA: Diagnosis not present

## 2018-06-19 DIAGNOSIS — H2511 Age-related nuclear cataract, right eye: Secondary | ICD-10-CM | POA: Diagnosis not present

## 2018-06-19 DIAGNOSIS — H25011 Cortical age-related cataract, right eye: Secondary | ICD-10-CM | POA: Diagnosis not present

## 2018-06-19 DIAGNOSIS — H25041 Posterior subcapsular polar age-related cataract, right eye: Secondary | ICD-10-CM | POA: Diagnosis not present

## 2018-06-20 DIAGNOSIS — G4733 Obstructive sleep apnea (adult) (pediatric): Secondary | ICD-10-CM | POA: Diagnosis not present

## 2018-07-02 DIAGNOSIS — H2511 Age-related nuclear cataract, right eye: Secondary | ICD-10-CM | POA: Diagnosis not present

## 2018-07-02 DIAGNOSIS — H25811 Combined forms of age-related cataract, right eye: Secondary | ICD-10-CM | POA: Diagnosis not present

## 2018-07-10 ENCOUNTER — Other Ambulatory Visit: Payer: Self-pay | Admitting: Internal Medicine

## 2018-07-10 DIAGNOSIS — R911 Solitary pulmonary nodule: Secondary | ICD-10-CM

## 2018-07-28 ENCOUNTER — Other Ambulatory Visit: Payer: Self-pay | Admitting: Internal Medicine

## 2018-08-11 ENCOUNTER — Telehealth: Payer: Self-pay | Admitting: Student

## 2018-08-11 NOTE — Telephone Encounter (Signed)
   Received page from Answering Service that patient has been in atrial fibrillation for the past 12 hours. Patient is a former ICU nurse. She has a history of atrial fibrillation s/p ablation in 09/2017. She is on PRN Cardizem and pill in pocket Flecainide at home. She states she went into atrial fibrillation around 11:45pm last night. She has took 30mg  of Diltiazem 30mg  at that time. When that did not help, she took 300mg  of Flecainide. She then took another 30mg  of Diltiazem at 4:00am and 9:00am with no improvement. BP around 11:45 am was 140/80 and HR was in the 100's to 120's. Patient reports palpitations and mild lightheadedness but no chest pain, shortness of breath, or near syncope. Discussed with Dr. Curt Bears - unfortunately only real option at this time is DCCV in ED or try to get patient into the A. Fib clinic tomorrow. Patient has not missed any doses of Xarelto. Patient stated she is most likely going to go to the ED because she does not want to wait until tomorrow. Will have husband drive her if she goes. Patient to call back if she decides she would rather have an appointment in the A. Fib clinic.  Darreld Mclean, PA-C 08/11/2018 12:21 PM

## 2018-08-23 ENCOUNTER — Ambulatory Visit (INDEPENDENT_AMBULATORY_CARE_PROVIDER_SITE_OTHER)
Admission: RE | Admit: 2018-08-23 | Discharge: 2018-08-23 | Disposition: A | Discharge status: Home / Self Care | Payer: Medicare Other | Source: Ambulatory Visit | Attending: Internal Medicine | Admitting: Internal Medicine

## 2018-08-23 ENCOUNTER — Other Ambulatory Visit: Payer: Self-pay

## 2018-08-23 DIAGNOSIS — R911 Solitary pulmonary nodule: Secondary | ICD-10-CM | POA: Diagnosis not present

## 2018-08-23 DIAGNOSIS — R918 Other nonspecific abnormal finding of lung field: Secondary | ICD-10-CM | POA: Diagnosis not present

## 2018-08-25 IMAGING — CT CT NECK SOFT TISSUE WO/W CM
2 of 10 series · 8 of 27 positions shown, 9 images · IV contrast (75CC ISOVUE 300)
Comparison: Nuclear medicine parathyroid scan 08/23/2016

CLINICAL DATA: Primary hyperparathyroidism. Rule out parathyroid
adenoma

EXAM:
CT NECK WITH AND WITHOUT CONTRAST
TECHNIQUE: Multidetector CT imaging of the neck was performed without and with
intravenous contrast.
CONTRAST:  75mL O4FQOI-H11 IOPAMIDOL (O4FQOI-H11) INJECTION 61%

[Series 4: arterial thin · axial · arterial · 0.39mm/px · z∈[+29,+221]mm · 3 of 309 slices shown, 4 images]
[im 1/309  soft-tissue]
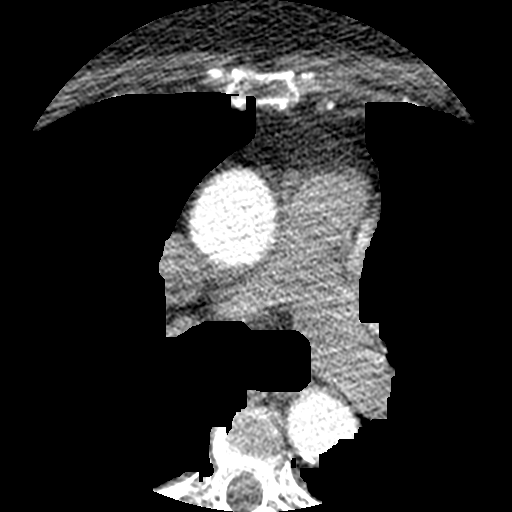
[im 1/309  bone]
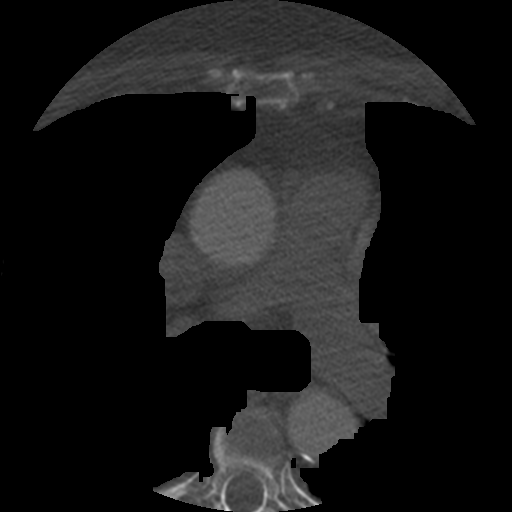
[im 155/309  bone]
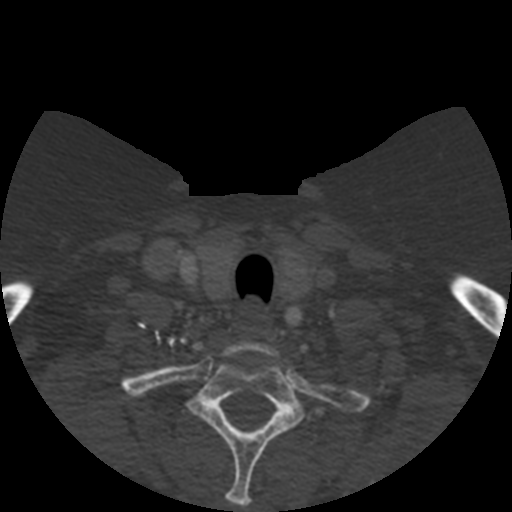
[im 309/309  bone]
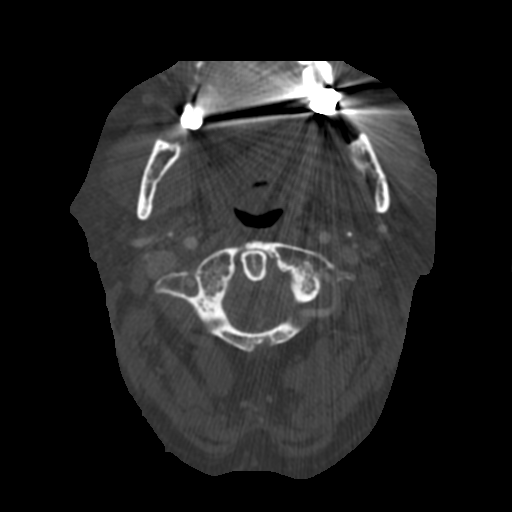

[Series 601: cor soft tissue neck · coronal · 0.39mm/px · 5 of 101 slices shown]
[im 17/101  bone]
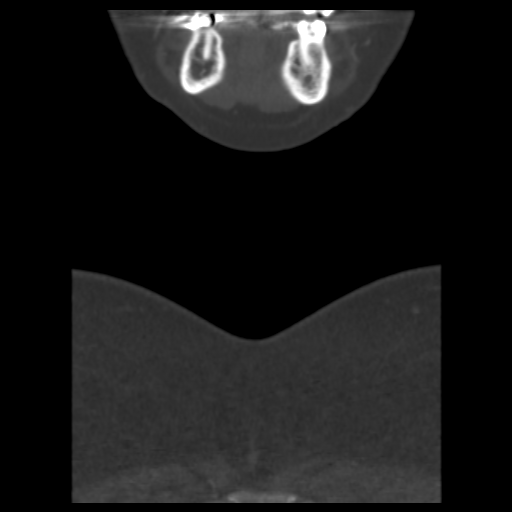
[im 34/101  bone]
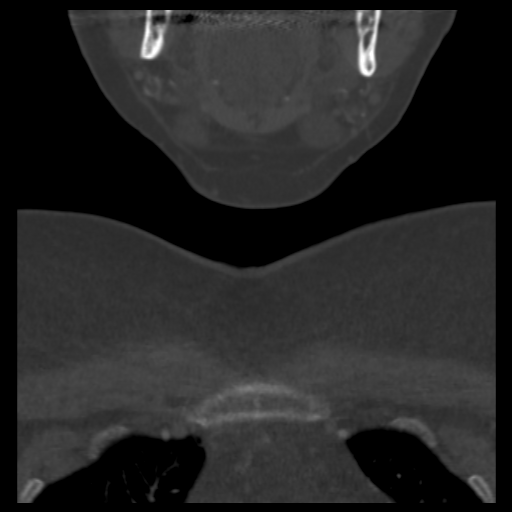
[im 51/101  bone]
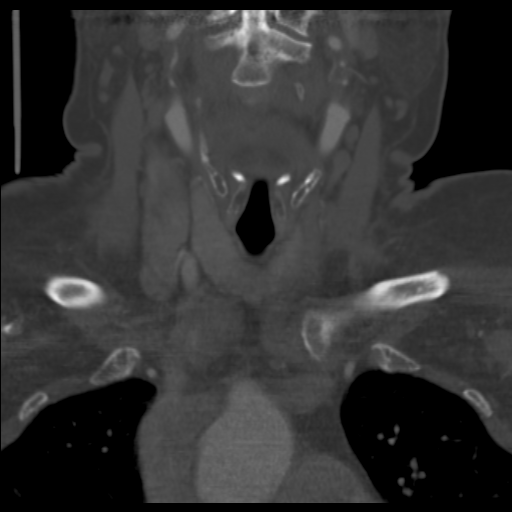
[im 67/101  bone]
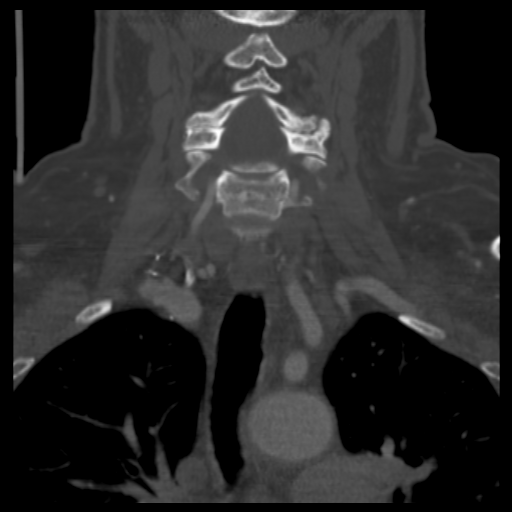
[im 84/101  bone]
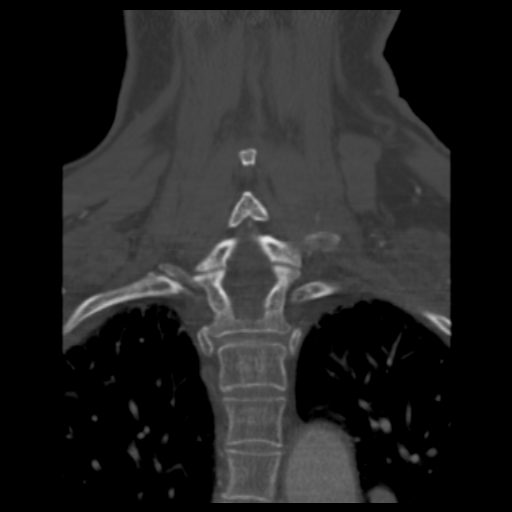

[8 of 27 positions shown; findings below may reference images not displayed]

FINDINGS: Pharynx and larynx: Negative

Salivary glands: Negative

Thyroid: Thyroid normal in size. No focal thyroid nodule. Left
thyroid lobe extends lower than the right thyroid lobe likely
accounting for the asymmetric activity in the left lower pole on
nuclear medicine scan.

Negative for parathyroid adenoma. No hyperenhancing nodules seen in
the region of the thyroid bed. No nodule is seen in the region of
the tracheoesophageal groove or superior mediastinum.

Lymph nodes: No pathologic or worrisome lymph nodes in the neck

Vascular: Mild atherosclerotic disease of the carotid bifurcation
bilaterally.

Limited intracranial: Not imaged

Visualized orbits: Not image

Mastoids and visualized paranasal sinuses: Not imaged

Skeleton: No acute skeletal abnormality.

Upper chest: Lung apices clear.

Other: None
IMPRESSION: Negative for parathyroid adenoma.

Carotid artery atherosclerotic disease bilaterally, relatively mild

## 2018-09-12 ENCOUNTER — Other Ambulatory Visit (HOSPITAL_BASED_OUTPATIENT_CLINIC_OR_DEPARTMENT_OTHER): Payer: Self-pay | Admitting: Internal Medicine

## 2018-09-12 DIAGNOSIS — Z1231 Encounter for screening mammogram for malignant neoplasm of breast: Secondary | ICD-10-CM

## 2018-09-16 ENCOUNTER — Encounter (HOSPITAL_BASED_OUTPATIENT_CLINIC_OR_DEPARTMENT_OTHER): Payer: Self-pay

## 2018-09-16 ENCOUNTER — Other Ambulatory Visit: Payer: Self-pay

## 2018-09-16 ENCOUNTER — Ambulatory Visit (HOSPITAL_BASED_OUTPATIENT_CLINIC_OR_DEPARTMENT_OTHER)
Admission: RE | Admit: 2018-09-16 | Discharge: 2018-09-16 | Disposition: A | Discharge status: Home / Self Care | Payer: Medicare Other | Source: Ambulatory Visit | Attending: Internal Medicine | Admitting: Internal Medicine

## 2018-09-16 DIAGNOSIS — Z1231 Encounter for screening mammogram for malignant neoplasm of breast: Secondary | ICD-10-CM | POA: Diagnosis not present

## 2018-09-18 ENCOUNTER — Other Ambulatory Visit: Payer: Self-pay

## 2018-09-19 ENCOUNTER — Encounter: Payer: Self-pay | Admitting: Internal Medicine

## 2018-09-19 ENCOUNTER — Ambulatory Visit (INDEPENDENT_AMBULATORY_CARE_PROVIDER_SITE_OTHER): Payer: Medicare Other | Admitting: Internal Medicine

## 2018-09-19 ENCOUNTER — Other Ambulatory Visit: Payer: Self-pay

## 2018-09-19 VITALS — BP 138/47 | HR 61 | Temp 97.9°F | Resp 16 | Ht 67.0 in | Wt 269.4 lb

## 2018-09-19 DIAGNOSIS — E210 Primary hyperparathyroidism: Secondary | ICD-10-CM

## 2018-09-19 DIAGNOSIS — Z23 Encounter for immunization: Secondary | ICD-10-CM | POA: Diagnosis not present

## 2018-09-19 DIAGNOSIS — Z79899 Other long term (current) drug therapy: Secondary | ICD-10-CM | POA: Diagnosis not present

## 2018-09-19 DIAGNOSIS — G8929 Other chronic pain: Secondary | ICD-10-CM | POA: Diagnosis not present

## 2018-09-19 DIAGNOSIS — I480 Paroxysmal atrial fibrillation: Secondary | ICD-10-CM

## 2018-09-19 DIAGNOSIS — Z01419 Encounter for gynecological examination (general) (routine) without abnormal findings: Secondary | ICD-10-CM

## 2018-09-19 DIAGNOSIS — M545 Low back pain: Secondary | ICD-10-CM | POA: Diagnosis not present

## 2018-09-19 DIAGNOSIS — E785 Hyperlipidemia, unspecified: Secondary | ICD-10-CM | POA: Diagnosis not present

## 2018-09-19 DIAGNOSIS — Z09 Encounter for follow-up examination after completed treatment for conditions other than malignant neoplasm: Secondary | ICD-10-CM

## 2018-09-19 DIAGNOSIS — Z0000 Encounter for general adult medical examination without abnormal findings: Secondary | ICD-10-CM | POA: Diagnosis not present

## 2018-09-19 DIAGNOSIS — E669 Obesity, unspecified: Secondary | ICD-10-CM

## 2018-09-19 LAB — BASIC METABOLIC PANEL
BUN: 11 mg/dL (ref 6–23)
CO2: 29 mEq/L (ref 19–32)
Calcium: 10.5 mg/dL (ref 8.4–10.5)
Chloride: 108 mEq/L (ref 96–112)
Creatinine, Ser: 0.66 mg/dL (ref 0.40–1.20)
GFR: 88.35 mL/min (ref >60.00)
Glucose, Bld: 97 mg/dL (ref 70–99)
Potassium: 4.7 mEq/L (ref 3.5–5.1)
Sodium: 142 mEq/L (ref 135–145)

## 2018-09-19 LAB — CBC WITH DIFFERENTIAL/PLATELET
Basophils Absolute: 0.1 10*3/uL (ref 0.0–0.1)
Basophils Relative: 1.1 % (ref 0.0–3.0)
Eosinophils Absolute: 0.3 10*3/uL (ref 0.0–0.7)
Eosinophils Relative: 5.2 % — ABNORMAL HIGH (ref 0.0–5.0)
HCT: 41.5 % (ref 36.0–46.0)
Hemoglobin: 13.7 g/dL (ref 12.0–15.0)
Lymphocytes Relative: 29 % (ref 12.0–46.0)
Lymphs Abs: 1.7 10*3/uL (ref 0.7–4.0)
MCHC: 32.9 g/dL (ref 30.0–36.0)
MCV: 87.8 fl (ref 78.0–100.0)
Monocytes Absolute: 0.6 10*3/uL (ref 0.1–1.0)
Monocytes Relative: 9.9 % (ref 3.0–12.0)
Neutro Abs: 3.1 10*3/uL (ref 1.4–7.7)
Neutrophils Relative %: 54.8 % (ref 43.0–77.0)
Platelets: 226 10*3/uL (ref 150.0–400.0)
RBC: 4.73 Mil/uL (ref 3.87–5.11)
RDW: 14.8 % (ref 11.5–15.5)
WBC: 5.7 10*3/uL (ref 4.0–10.5)

## 2018-09-19 LAB — VITAMIN D 25 HYDROXY (VIT D DEFICIENCY, FRACTURES): VITD: 26.83 ng/mL — ABNORMAL LOW (ref 30.00–100.00)

## 2018-09-19 LAB — TSH: TSH: 1.14 u[IU]/mL (ref 0.35–4.50)

## 2018-09-19 MED ORDER — ACETAMINOPHEN-CODEINE #3 300-30 MG PO TABS: 1.0000 | ORAL_TABLET | Freq: Every day | ORAL | 0 refills | Status: DC | PRN

## 2018-09-19 MED FILL — ACETAMINOPHEN/COD #3 TABLET: 300-30 | 30 days supply | Qty: 30 | Fill #0

## 2018-09-19 NOTE — Patient Instructions (Addendum)
GO TO THE LAB : Get the blood work     GO TO THE FRONT DESK Schedule your next appointment for a   checkup in 4 months  

## 2018-09-19 NOTE — Progress Notes (Signed)
Subjective:    Patient ID: Veronica Bradford, female    DOB: [birth year], 73 y.o.   MRN: [id number]  DOS:  09/19/2018 Type of visit - description: CPX Note from cardiology and recent chest CT report reviewed.    Review of Systems Reports chronic skin irritation on the lower extremities.  Wonders if something can be done Having discomfort for the lower abdomen, it feels like it is located in the fat tissue.  Sometimes hurts with changing positions. Denies any redness or swelling. Request Tylenol #3 to be refilled   Other than above, a 14 point review of systems is negative    Past Medical History:  Diagnosis Date  . Back pain   . Chronic sinusitis    s/p 2 surgeries remotely, [name]  . Closed fracture of metatarsal of left foot    L foot, fifth metatarsal  . Ectopic pregnancy   . Endometrial cancer ([place]) 2004   Early diagnosis  . GERD (gastroesophageal reflux disease)   . Hyperlipemia   . Hypertension   . IBS (irritable bowel syndrome) 08/13/2014   Dx 2015, diarrhea on-off, Dr [name]   . Lumbar radiculopathy 08/2012   MRI in 08/2012  . Mild depression ([place])    Pt's son has Down's Syndrome  . Mild hyperparathyroidism ([place])   . Osteopenia   . PAF (paroxysmal atrial fibrillation) ([place])   . Prediabetes   . Sciatica of right side 08/2013   Received Depomedrol 80 mg injection  . Sleep apnea 09/09/2013   Dx with OSA in 2015, by Dr. Caryl Comes, APAP    Past Surgical History:  Procedure Laterality Date  . ABDOMINAL HYSTERECTOMY    . ATRIAL FIBRILLATION ABLATION N/A 09/04/2017   Procedure: ATRIAL FIBRILLATION ABLATION;  Surgeon: [name], MD;  Location: [facility];  Service: Cardiovascular;  Laterality: N/A;  . CHOLECYSTECTOMY  ~2006  . DILATION AND CURETTAGE OF UTERUS     after SAB  . ESOPHAGOGASTRODUODENOSCOPY  02/09/2014   Dr. [name]  . FINGER SURGERY Left    index  . NASAL SINUS SURGERY     x 2 remotely, [name]  . TONSILLECTOMY    . TOTAL ABDOMINAL  HYSTERECTOMY W/ BILATERAL SALPINGOOPHORECTOMY    . TOTAL KNEE ARTHROPLASTY Right ~2008    Social History   Socioeconomic History  . Marital status: Married    Spouse name: Not on file  . Number of children: 1  . Years of education: Not on file  . Highest education level: Not on file  Occupational History  . 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 Financial resource strain: Not on file  . Food insecurity    Worry: Not on file    Inability: Not on file  . Transportation needs    Medical: Not on file    Non-medical: Not on file  Tobacco Use  . Smoking status: Former Smoker    Quit date: 01/06/1980    Years since quitting: 38.7  . Smokeless tobacco: Never Used  . Tobacco comment: smoked from 1970 to 1982, less than 1 ppd  Substance and Sexual Activity  . Alcohol use: Yes    Comment: rare  . Drug use: No  . Sexual activity: Not Currently    Birth control/protection: None  Lifestyle  . Physical activity    Days per week: Not on file    Minutes per session: Not on file  . Stress: Not on file  Relationships  . Social connections  Talks on phone: Not on file    Gets together: Not on file    Attends religious service: Not on file    Active member of club or organization: Not on file    Attends meetings of clubs or organizations: Not on file    Relationship status: Not on file  . Intimate partner violence    Fear of current or ex partner: Not on file    Emotionally abused: Not on file    Physically abused: Not on file    Forced sexual activity: Not on file  Other Topics Concern  . Not on file  Social History Narrative   Lives w/ husband, and son Veronica Bradford   Retired Marine scientist   Lives in [place]     Family History  Problem Relation Age of Onset  . Hypertension Mother   . Colon cancer Mother   . Breast cancer Mother   . Ovarian cancer Mother   . Hypertension Father   . Diabetes Father   . Head & neck cancer Sister   . CAD Neg Hx      Allergies as of  09/19/2018      Reactions   Nsaids Other (See Comments)   Pt on Xarelto   Penicillins    Local reaction   Tolmetin Other (See Comments)   Pt on Xarelto   Zoster Vaccine Live Other (See Comments)   Sickness per Pt.    Amoxicillin Rash      Medication List       Accurate as of September 19, 2018 11:59 PM. If you have any questions, ask your nurse or doctor.        STOP taking these medications   clindamycin 150 MG capsule Commonly known as: CLEOCIN Stopped by: Kathlene November, MD     TAKE these medications   acetaminophen-codeine 300-30 MG tablet Commonly known as: TYLENOL #3 Take 1 tablet by mouth daily as needed for moderate pain.   Benadryl Allergy 25 MG tablet Generic drug: diphenhydrAMINE PRN for sleep   Chromium Picolinate 1000 MCG Tabs Take 1,000 mcg by mouth every evening.   Co Q 10 100 MG Caps Take 100 mg by mouth daily.   Cranberry 600 MG Tabs   diltiazem 30 MG tablet Commonly known as: Cardizem Take 1 tablet every 4 hours AS NEEDED for heart rate >100 as long as blood pressure >100.   Fish Oil 1000 MG Caps Take 1,000 mg by mouth daily.   Flaxseed Oil 1000 MG Caps Take 1 capsule by mouth daily.   flecainide 150 MG tablet Commonly known as: TAMBOCOR Take 2 tablets (300 mg total) by mouth as needed (FOR AFIB). Pill in a pocket   fluticasone 50 MCG/ACT nasal spray Commonly known as: FLONASE Place 1 spray into both nostrils daily.   loperamide 2 MG tablet Commonly known as: IMODIUM A-D Take 2 mg by mouth as needed for diarrhea or loose stools.   Lutein 40 MG Caps   Magnesium 500 MG Tabs Take 1 tablet by mouth daily.   MULTIVITAMIN PO Take 1 tablet by mouth daily.   pantoprazole 40 MG tablet Commonly known as: PROTONIX Take 40 mg by mouth daily.   pravastatin 40 MG tablet Commonly known as: PRAVACHOL Take 1 tablet (40 mg total) by mouth at bedtime.   valsartan 320 MG tablet Commonly known as: DIOVAN Take 0.5 tablets (160 mg total) by mouth  2 (two) times daily.   vitamin B-12 1000 MCG tablet Commonly known as:  CYANOCOBALAMIN Take 2,000 mcg by mouth every evening.   Vitamin D 50 MCG (2000 UT) Caps Take 2,000 Units by mouth daily.   Xarelto 20 MG Tabs tablet Generic drug: rivaroxaban TAKE 1 TABLET BY MOUTH  DAILY WITH SUPPER           Objective:   Physical Exam Musculoskeletal:       Legs:    BP (!) 138/47 (BP Location: Left Arm, Patient Position: Sitting, Cuff Size: Normal)   Pulse 61   Temp 97.9 F (36.6 C) (Temporal)   Resp 16   Ht 5\' 7"  (1.702 m)   Wt 269 lb 6 oz (122.2 kg)   SpO2 99%   BMI 42.19 kg/m  General:   Well developed, NAD, BMI noted.  HEENT:  Normocephalic . Face symmetric, atraumatic Lungs:  CTA B Normal respiratory effort, no intercostal retractions, no accessory muscle use. Heart: RRR,  no murmur.  no pretibial edema bilaterally  Abdomen:  Not distended, soft, non-tender.  Very large adipose tissue noted with no abnormalities Skin: See graphic  Neurologic:  alert & oriented X3.  Speech normal, gait appropriate for age and unassisted Psych--  Cognition and judgment appear intact.  Cooperative with normal attention span and concentration.  Behavior appropriate. No anxious or depressed appearing.     Assessment      Assessment: Prediabetes HTN Hyperlipidemia Chronic lower extremity edema L>R (eval by previous pcp, related to varicose veins?) CV:  --Paroxysmal atrial fibrillation, a flutter: on xarelto flecainide-diltiazem  prn --Ablation 09/2017 GI: Dr [name]  --GERD (zantac or protonix prn), IBS, chronic diarrhea --GI records: [place], 2004, 2009 8 and 07-2013. Negative for polyps or IBD. + Diverticuli. Next 202 due to [place]. Had a EGD/cscope 11/2016 d --Increased LFTs: CT abdomen 05/2016: Normal liver. 05/2017: WNL  Hepatitis B and C, Alpha 1 antitrypsin, anti-smooth muscle antibody, ANA trans-ferritin  OSA--- CPAP, Dr. Maxwell Caul Osteopenia:  Multiple  Dexas,  last two:  T score 2009  -1.1; T score 2015  -0.7; T score 05/2017 -1.8 DJD --Chronic back pain-- tylenol #3 prn --- RF per PCP --history of epidurals in the past, MRI 2014: Scoliosis,DJD --CIPRO pre dental d/t knee replacement (rx by ortho) H/o endometrial cancer, found during a hysterectomy, no chemotherapy or XRT.  Primary hyperparathyroidism: Chronic hypercalcemia, increased PTH 05/2016, refered to ENT  PLAN:  Here for CPX  Diabetes: Check A1c HTN: Seems controlled Hyperlipidemia: Last FLP satisfactory Chronic LE edema and skin changes: Likely stasis dermatitis due to varicose veins and obesity.  Recommend to keep the area protected,use moisturizers. Abdominal pain: As described above likely from pendulous adipose tissue.  Weight loss would be beneficial. Chronic back pain: RF Tylenol 3, check a UDS.  She uses on average 1 or 2 tablets a week. Osteopenia: Will discuss on RTC paroxysmal atrial fibrillation: Last visit with cardiology 04/2018.  They noted rare palpitations, on flecainide-cardiazem  prn and Xarelto. Pulmonary nodule: CT 08/23/2018, interval resolution of subpleural nodules, there are two unchanged 3 mm nodules, no follow-up needed Hyperparathyroidism, thyroid nodule: Neck ultrasound 10/01/2016, last visit with surgery 10/26/2017.  They recommend watchful waiting and will follow-up in 1 year.  Patient requests labs, will do well vitamin D, PTH intact and a calcium level (BMP) RTC 4 months  Today in addition to the physical exam I spent more than 20    min with the patient: >50% of the time counseling regards her chronic medical problems

## 2018-09-19 NOTE — Progress Notes (Signed)
Pre visit review using our clinic review tool, if applicable. No additional management support is needed unless otherwise documented below in the visit note. 

## 2018-09-20 LAB — PAIN MGMT, PROFILE 8 W/CONF, U
6 Acetylmorphine: NEGATIVE ng/mL
Alcohol Metabolites: NEGATIVE ng/mL (ref <500)
Amphetamines: NEGATIVE ng/mL
Benzodiazepines: NEGATIVE ng/mL
Buprenorphine, Urine: NEGATIVE ng/mL
Cocaine Metabolite: NEGATIVE ng/mL
Creatinine: 32.4 mg/dL
MDMA: NEGATIVE ng/mL
Marijuana Metabolite: NEGATIVE ng/mL
Opiates: NEGATIVE ng/mL
Oxidant: NEGATIVE ug/mL
Oxycodone: NEGATIVE ng/mL
pH: 6.5 (ref 4.5–9.0)

## 2018-09-20 LAB — PTH, INTACT AND CALCIUM
Calcium: 10.1 mg/dL (ref 8.6–10.4)
PTH: 157 pg/mL — ABNORMAL HIGH (ref 14–64)

## 2018-09-21 NOTE — Assessment & Plan Note (Signed)
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  Recommend to keep the area protected,use moisturizers. Abdominal pain: As described above likely from pendulous adipose tissue.  Weight loss would be beneficial. Chronic back pain: RF Tylenol 3, check a UDS.  She uses on average 1 or 2 tablets a week. Osteopenia: Will discuss on RTC paroxysmal atrial fibrillation: Last visit with cardiology 04/2018.  They noted rare palpitations, on flecainide-cardiazem  prn and Xarelto. Pulmonary nodule: CT 08/23/2018, interval resolution of subpleural nodules, there are two unchanged 3 mm nodules, no follow-up needed Hyperparathyroidism, thyroid nodule: Neck ultrasound 10/01/2016, last visit with surgery 10/26/2017.  They recommend watchful waiting and will follow-up in 1 year.  Patient requests labs, will do well vitamin D, PTH intact and a calcium level (BMP) RTC 4 months

## 2018-09-21 NOTE — Assessment & Plan Note (Signed)
--  Td 2013 - pnm shot 2015 - prevnar 2015 -  zostavax 2013: had a severe HA (and fever?) after zostavax  - shingrex discussed, reluctant to proceed d/t s/e from zostavax - flu shot today --Female care: per gyn, h/o endometrial ca; saw Gyn 07/16/2017, no further Paps needed but follow-up with gynecology every year for a pelvic exam.  Will refer to gynecology PAP 12- 2017 (-) MMG 09/2018 neg   --CCS:  cscope 2015 -- had a  Colonoscopy & EGD 11-2016 d/t GI sx; no Cscope  report, + polyps per pt; next  per  GI --Diet and exercise discussed --Labs: BMP, CBC, TSH, UDS, PTH, vitamin D

## 2018-09-25 ENCOUNTER — Other Ambulatory Visit: Payer: Self-pay

## 2018-09-25 ENCOUNTER — Encounter: Payer: Self-pay | Admitting: Internal Medicine

## 2018-09-25 MED ORDER — VITAMIN D (ERGOCALCIFEROL) 1.25 MG (50000 UNIT) PO CAPS: 50000.0000 [IU] | ORAL_CAPSULE | ORAL | 0 refills | Status: DC

## 2018-09-27 ENCOUNTER — Ambulatory Visit: Payer: Medicare Other | Admitting: Family Medicine

## 2018-10-04 DIAGNOSIS — K580 Irritable bowel syndrome with diarrhea: Secondary | ICD-10-CM | POA: Diagnosis not present

## 2018-10-04 DIAGNOSIS — R198 Other specified symptoms and signs involving the digestive system and abdomen: Secondary | ICD-10-CM | POA: Diagnosis not present

## 2018-10-04 DIAGNOSIS — R197 Diarrhea, unspecified: Secondary | ICD-10-CM | POA: Diagnosis not present

## 2018-10-07 DIAGNOSIS — L821 Other seborrheic keratosis: Secondary | ICD-10-CM | POA: Diagnosis not present

## 2018-10-07 DIAGNOSIS — D225 Melanocytic nevi of trunk: Secondary | ICD-10-CM | POA: Diagnosis not present

## 2018-10-07 DIAGNOSIS — D1801 Hemangioma of skin and subcutaneous tissue: Secondary | ICD-10-CM | POA: Diagnosis not present

## 2018-10-07 DIAGNOSIS — L570 Actinic keratosis: Secondary | ICD-10-CM | POA: Diagnosis not present

## 2018-10-07 DIAGNOSIS — L814 Other melanin hyperpigmentation: Secondary | ICD-10-CM | POA: Diagnosis not present

## 2018-10-09 ENCOUNTER — Encounter: Payer: Self-pay | Admitting: Obstetrics & Gynecology

## 2018-10-09 ENCOUNTER — Ambulatory Visit (INDEPENDENT_AMBULATORY_CARE_PROVIDER_SITE_OTHER): Payer: Medicare Other | Admitting: Obstetrics & Gynecology

## 2018-10-09 ENCOUNTER — Other Ambulatory Visit: Payer: Self-pay

## 2018-10-09 VITALS — BP 130/42 | HR 64 | Ht 67.0 in | Wt 267.1 lb

## 2018-10-09 DIAGNOSIS — Z8542 Personal history of malignant neoplasm of other parts of uterus: Secondary | ICD-10-CM | POA: Diagnosis not present

## 2018-10-09 DIAGNOSIS — Z01419 Encounter for gynecological examination (general) (routine) without abnormal findings: Secondary | ICD-10-CM

## 2018-10-09 NOTE — Progress Notes (Signed)
Subjective:     Veronica Bradford is a 73 y.o. female here for a routine exam.  Current complaints: none. Pt denies GYN problems. She reports that she has pain at times from the IBS. Pt denies bleeding or pelvic pain. Seh reports a h/o uterine cancer. Her mother had breast, colon and ovarian cancer that was dx'd when she was >85 years of age.    Gynecologic History No LMP recorded. Patient has had a hysterectomy. Contraception: status post hysterectomy Last Pap: prior to hyst. Results were: normal Last mammogram: 09/16/2018. Results were: normal  Obstetric History OB History  Gravida Para Term Preterm AB Living  4 1 1   3 1   SAB TAB Ectopic Multiple Live Births  2   1   1     # Outcome Date GA Lbr Len/2nd Weight Sex Delivery Anes PTL Lv  4 SAB 1989          3 SAB 1986          2 Term 65 [redacted]w[redacted]d   M Vag-Spont None  LIV  1 Ectopic 1980           The following portions of the patient's history were reviewed and updated as appropriate: allergies, current medications, past family history, past medical history, past social history, past surgical history and problem list.  Review of Systems Pertinent items are noted in HPI.    Objective:  BP (!) 130/42   Pulse 64   Ht 5\' 7"  (1.702 m)   Wt 267 lb 1.3 oz (121.1 kg)   BMI 41.83 kg/m   General Appearance:    Alert, cooperative, no distress, appears stated age  Head:    Normocephalic, without obvious abnormality, atraumatic  Eyes:    conjunctiva/corneas clear, EOM's intact, both eyes  Ears:    Normal external ear canals, both ears  Nose:   Nares normal, septum midline, mucosa normal, no drainage    or sinus tenderness  Throat:   Lips, mucosa, and tongue normal; teeth and gums normal  Neck:   Supple, symmetrical, trachea midline, no adenopathy;    thyroid:  no enlargement/tenderness/nodules  Back:     Symmetric, no curvature, ROM normal, no CVA tenderness  Lungs:     respirations unlabored  Chest Wall:    No tenderness or deformity   Heart:    Regular rate and rhythm  Breast Exam:    No tenderness, masses, or nipple abnormality  Abdomen:     Soft, non-tender, bowel sounds active all four quadrants,    no masses, no organomegaly  Genitalia:    Normal female without lesion, discharge or tenderness   No nodularity or masses at the cuff. Rectal not repeated it was done within the month by primary care.   Extremities:   Extremities normal, atraumatic, no cyanosis or edema  Pulses:   2+ and symmetric all extremities  Skin:   Skin color, texture, turgor normal, no rashes or lesions    Assessment:    Healthy female exam.   H/o Uterine cancer.    Plan:    Follow up in: 1 year.    Rec annual mammogram and pelvic exam   Noha Milberger L. Harraway-Smith, M.D., Cherlynn June

## 2018-10-11 DIAGNOSIS — K580 Irritable bowel syndrome with diarrhea: Secondary | ICD-10-CM | POA: Diagnosis not present

## 2018-10-15 DIAGNOSIS — H3581 Retinal edema: Secondary | ICD-10-CM | POA: Diagnosis not present

## 2018-10-15 DIAGNOSIS — H43392 Other vitreous opacities, left eye: Secondary | ICD-10-CM | POA: Diagnosis not present

## 2018-10-15 DIAGNOSIS — H35373 Puckering of macula, bilateral: Secondary | ICD-10-CM | POA: Diagnosis not present

## 2018-10-15 DIAGNOSIS — H43813 Vitreous degeneration, bilateral: Secondary | ICD-10-CM | POA: Diagnosis not present

## 2018-10-21 ENCOUNTER — Other Ambulatory Visit: Payer: Self-pay | Admitting: Internal Medicine

## 2018-10-28 ENCOUNTER — Telehealth: Payer: Medicare Other | Admitting: Internal Medicine

## 2018-10-28 DIAGNOSIS — H26492 Other secondary cataract, left eye: Secondary | ICD-10-CM | POA: Diagnosis not present

## 2018-10-28 DIAGNOSIS — H35372 Puckering of macula, left eye: Secondary | ICD-10-CM | POA: Diagnosis not present

## 2018-10-28 DIAGNOSIS — H3581 Retinal edema: Secondary | ICD-10-CM | POA: Diagnosis not present

## 2018-11-05 ENCOUNTER — Telehealth (HOSPITAL_COMMUNITY): Payer: Self-pay | Admitting: *Deleted

## 2018-11-05 NOTE — Telephone Encounter (Signed)
Patient called in stating her AF burden has been increasing in frequency over the last 10 days requiring PIP use of cardizem and flecainide. HR ws between 80-110. Pt states post episodes she is fatigued, brain fog and just not feeling right for about 24 hours then back to baseline. BP 93/61 HR 48 now-  encouraged to increase fluid intake to help with BP. Pt has follow up with Dr. Rayann Heman on 11/16 - will notify his office of recent uptick in AF burden. Pt would like a sooner appt with [facility] - will send to his RN/Scheduler to see if this is available. Pt will continue current medication regimen until follow up.

## 2018-11-06 ENCOUNTER — Encounter: Payer: Self-pay | Admitting: Internal Medicine

## 2018-11-06 DIAGNOSIS — H3581 Retinal edema: Secondary | ICD-10-CM | POA: Diagnosis not present

## 2018-11-06 DIAGNOSIS — H35371 Puckering of macula, right eye: Secondary | ICD-10-CM | POA: Diagnosis not present

## 2018-11-06 MED ORDER — FLECAINIDE ACETATE 150 MG PO TABS: 300.0000 mg | ORAL_TABLET | Freq: Every day | ORAL | 5 refills | Status: DC | PRN

## 2018-11-06 MED ORDER — DILTIAZEM HCL 30 MG PO TABS: ORAL_TABLET | ORAL | 5 refills | Status: DC

## 2018-11-06 NOTE — Telephone Encounter (Signed)
Pt's medications were sent to pt's pharmacy as requested. Confirmation received.  

## 2018-11-07 ENCOUNTER — Telehealth (INDEPENDENT_AMBULATORY_CARE_PROVIDER_SITE_OTHER): Payer: Medicare Other | Admitting: Internal Medicine

## 2018-11-07 ENCOUNTER — Encounter: Payer: Self-pay | Admitting: Internal Medicine

## 2018-11-07 VITALS — Ht 67.0 in | Wt 263.0 lb

## 2018-11-07 DIAGNOSIS — G4733 Obstructive sleep apnea (adult) (pediatric): Secondary | ICD-10-CM | POA: Diagnosis not present

## 2018-11-07 DIAGNOSIS — I480 Paroxysmal atrial fibrillation: Secondary | ICD-10-CM | POA: Diagnosis not present

## 2018-11-07 NOTE — Progress Notes (Signed)
Electrophysiology TeleHealth Note   Due to national recommendations of social distancing due to COVID 19, an audio/video telehealth visit is felt to be most appropriate for this patient at this time.  See MyChart message from today for the patient's consent to telehealth for [name].   Date:  11/07/2018   ID:  Veronica Bradford, DOB 11-Feb-1945, MRN [id number]  Location: patient's home  Provider location:  [facility]  Evaluation Performed: Follow-up visit  PCP:  Colon Branch, MD   Electrophysiologist:  [name]  Chief Complaint:  palpitations  History of Present Illness:    Veronica Bradford is a 73 y.o. female who presents via telehealth conferencing today.  Since last being seen in our clinic, the patient reports doing very well.  She continues to have occasional palpitations.  She is recovering from retinal surgery and has noticed increased palpitations since that time.  Today, she denies symptoms of chest pain, shortness of breath,  lower extremity edema, dizziness, presyncope, or syncope.  The patient is otherwise without complaint today.  The patient denies symptoms of fevers, chills, cough, or new SOB worrisome for COVID 19.  Past Medical History:  Diagnosis Date  . Back pain   . Chronic sinusitis    s/p 2 surgeries remotely, [name]  . Closed fracture of metatarsal of left foot    L foot, fifth metatarsal  . Ectopic pregnancy   . Endometrial cancer ([place]) 2004   Early diagnosis  . GERD (gastroesophageal reflux disease)   . Hyperlipemia   . Hypertension   . IBS (irritable bowel syndrome) 08/13/2014   Dx 2015, diarrhea on-off, Dr [name]   . Lumbar radiculopathy 08/2012   MRI in 08/2012  . Mild depression ([place])    Pt's son has Down's Syndrome  . Mild hyperparathyroidism ([place])   . Osteopenia   . PAF (paroxysmal atrial fibrillation) ([place])   . Prediabetes   . Sciatica of right side 08/2013   Received Depomedrol 80 mg injection  . Sleep apnea 09/09/2013   Dx with  OSA in 2015, by Dr. Caryl Comes, APAP    Past Surgical History:  Procedure Laterality Date  . ABDOMINAL HYSTERECTOMY    . ATRIAL FIBRILLATION ABLATION N/A 09/04/2017   Procedure: ATRIAL FIBRILLATION ABLATION;  Surgeon: [name], MD;  Location: [place] [facility];  Service: Cardiovascular;  Laterality: N/A;  . CHOLECYSTECTOMY  ~2006  . DILATION AND CURETTAGE OF UTERUS     after SAB  . ESOPHAGOGASTRODUODENOSCOPY  02/09/2014   Dr. [name]  . FINGER SURGERY Left    index  . NASAL SINUS SURGERY     x 2 remotely, [name]  . TONSILLECTOMY    . TOTAL ABDOMINAL HYSTERECTOMY W/ BILATERAL SALPINGOOPHORECTOMY    . TOTAL KNEE ARTHROPLASTY Right ~2008    Current Outpatient Medications  Medication Sig Dispense Refill  . acetaminophen-codeine (TYLENOL #3) 300-30 MG tablet Take 1 tablet by mouth daily as needed for moderate pain. 30 tablet 0  . Cholecalciferol (VITAMIN D) 2000 units CAPS Take 2,000 Units by mouth daily.     . Chromium Picolinate 1000 MCG TABS Take 1,000 mcg by mouth every evening.    . Coenzyme Q10 (CO Q 10) 100 MG CAPS Take 100 mg by mouth daily.     . Cranberry 600 MG TABS     . diltiazem (CARDIZEM) 30 MG tablet Take 1 tablet every 4 hours AS NEEDED for heart rate >100 as long as blood pressure >100.  45 tablet 5  . diphenhydrAMINE (BENADRYL ALLERGY) 25 MG tablet PRN for sleep    . flecainide (TAMBOCOR) 150 MG tablet Take 2 tablets (300 mg total) by mouth daily as needed (FOR AFIB). Pill in a pocket 60 tablet 5  . fluticasone (FLONASE) 50 MCG/ACT nasal spray Place 1 spray into both nostrils daily.     Marland Kitchen loperamide (IMODIUM A-D) 2 MG tablet Take 2 mg by mouth as needed for diarrhea or loose stools.    . Lutein 40 MG CAPS     . Magnesium 500 MG TABS Take 1 tablet by mouth daily.    . Multiple Vitamins-Minerals (MULTIVITAMIN PO) Take 1 tablet by mouth daily.    . Omega-3 Fatty Acids (FISH OIL) 1000 MG CAPS Take 1,000 mg by mouth daily.     . pantoprazole (PROTONIX) 40 MG tablet Take  40 mg by mouth daily.    . pravastatin (PRAVACHOL) 40 MG tablet Take 1 tablet (40 mg total) by mouth at bedtime. 90 tablet 3  . valsartan (DIOVAN) 320 MG tablet Take 0.5 tablets (160 mg total) by mouth 2 (two) times daily. 90 tablet 2  . vitamin B-12 (CYANOCOBALAMIN) 1000 MCG tablet Take 2,000 mcg by mouth every evening.     . Vitamin D, Ergocalciferol, (DRISDOL) 1.25 MG (50000 UT) CAPS capsule Take 1 capsule (50,000 Units total) by mouth every 7 (seven) days. 12 capsule 0  . XARELTO 20 MG TABS tablet TAKE 1 TABLET BY MOUTH  DAILY WITH SUPPER 90 tablet 1   No current facility-administered medications for this visit.     Allergies:   Nsaids, Penicillins, Tolmetin, Zoster vaccine live, and Amoxicillin   Social History:  The patient  reports that she quit smoking about 38 years ago. She has never used smokeless tobacco. She reports current alcohol use. She reports that she does not use drugs.   Family History:  The patient's family history includes Breast cancer in her mother; Colon cancer in her mother; Diabetes in her father; Head & neck cancer in her sister; Hypertension in her father and mother; Ovarian cancer in her mother.   ROS:  Please see the history of present illness.   All other systems are personally reviewed and negative.    Exam:    Vital Signs:  Ht 5\' 7"  (1.702 m)   Wt 263 lb (119.3 kg)   BMI 41.19 kg/m   Well sounding and appearing, alert and conversant, regular work of breathing,  good skin color Eyes- anicteric, neuro- grossly intact, skin- no apparent rash or lesions or cyanosis, mouth- oral mucosa is pink  Labs/Other Tests and Data Reviewed:    Recent Labs: 05/31/2018: ALT 15 09/19/2018: BUN 11; Creatinine, Ser 0.66; Hemoglobin 13.7; Platelets 226.0; Potassium 4.7; Sodium 142; TSH 1.14   Wt Readings from Last 3 Encounters:  11/07/18 263 lb (119.3 kg)  10/09/18 267 lb 1.3 oz (121.1 kg)  09/19/18 269 lb 6 oz (122.2 kg)     I have reviewed [facility] mobile  transmissions from 10/17/2018 and also 11/05/2018 which confirm afib  ASSESSMENT & PLAN:    1.  Palpitations Paroxysmal atrial fibrillation/ atrial flutter S/p PVI/ CTI ablation 2019 She continues to have palpitations despite medical therapy with flecainide. I have reviewed recent [facility] which clearly show afib. She is on xarelto The patient has symptomatic, recurrent paroxysmal atrial fibrillation. she has failed medical therapy with flecainide.  She is s/p afib ablation 2019. Therapeutic strategies for afib including medicine (sotalol) and ablation  were discussed in detail with the patient today. Risk, benefits, and alternatives to EP study and radiofrequency ablation for afib were also discussed in detail today.  She is clear that she is not interested in ablation at this time.  Given RBBB, LAD I would not advise flecainide daily  2. OSA Compliant with CPAP  3. HTN Stable No change required today  4. Obesity Body mass index is 41.19 kg/m. Lifestyle modification is advised I have reviewed the patients BMI and decreased success rates with ablation at length today.  Weight loss is strongly advised.  Per Guijian et al (PACE 2013; [id number]), patients with BMI 25-29.9 (obese) have a 27% increase in AF recurrence post ablation.  Patients with BMI >30 have a 31% increase in AF recurrence post ablation when compared to those with BMI <25.  Follow-up:  AF clinic in 3 months  Patient Risk:  after full review of this patients clinical status, I feel that they are at moderate risk at this time.  Today, I have spent 15 minutes with the patient with telehealth technology discussing arrhythmia management .    SignedThompson Grayer, MD  11/07/2018 2:17 PM     [address] [place] [address] [phone] (office) [phone] (fax)

## 2018-11-08 DIAGNOSIS — G4733 Obstructive sleep apnea (adult) (pediatric): Secondary | ICD-10-CM | POA: Diagnosis not present

## 2018-11-18 ENCOUNTER — Telehealth: Payer: Medicare Other | Admitting: Internal Medicine

## 2018-11-18 DIAGNOSIS — H35371 Puckering of macula, right eye: Secondary | ICD-10-CM | POA: Insufficient documentation

## 2018-11-18 DIAGNOSIS — H3581 Retinal edema: Secondary | ICD-10-CM | POA: Insufficient documentation

## 2018-11-27 DIAGNOSIS — H35372 Puckering of macula, left eye: Secondary | ICD-10-CM | POA: Diagnosis not present

## 2018-11-27 DIAGNOSIS — H3581 Retinal edema: Secondary | ICD-10-CM | POA: Diagnosis not present

## 2018-12-09 ENCOUNTER — Other Ambulatory Visit: Payer: Self-pay | Admitting: Internal Medicine

## 2018-12-09 NOTE — Telephone Encounter (Signed)
Pt's age 73, wt 119.3 kg, SCr 0.66, CrCl 149.38, last ov w/ Dr. Rayann Heman 11/07/18.

## 2019-01-13 DIAGNOSIS — G4733 Obstructive sleep apnea (adult) (pediatric): Secondary | ICD-10-CM | POA: Diagnosis not present

## 2019-01-15 ENCOUNTER — Encounter: Payer: Self-pay | Admitting: Internal Medicine

## 2019-01-20 ENCOUNTER — Encounter: Payer: Self-pay | Admitting: Internal Medicine

## 2019-01-20 ENCOUNTER — Other Ambulatory Visit: Payer: Self-pay

## 2019-01-20 ENCOUNTER — Ambulatory Visit (INDEPENDENT_AMBULATORY_CARE_PROVIDER_SITE_OTHER): Payer: Medicare Other | Admitting: Internal Medicine

## 2019-01-20 VITALS — BP 105/55 | HR 61

## 2019-01-20 DIAGNOSIS — M545 Low back pain: Secondary | ICD-10-CM

## 2019-01-20 DIAGNOSIS — M8580 Other specified disorders of bone density and structure, unspecified site: Secondary | ICD-10-CM

## 2019-01-20 DIAGNOSIS — G8929 Other chronic pain: Secondary | ICD-10-CM

## 2019-01-20 DIAGNOSIS — E559 Vitamin D deficiency, unspecified: Secondary | ICD-10-CM | POA: Insufficient documentation

## 2019-01-20 DIAGNOSIS — I480 Paroxysmal atrial fibrillation: Secondary | ICD-10-CM

## 2019-01-20 DIAGNOSIS — E210 Primary hyperparathyroidism: Secondary | ICD-10-CM

## 2019-01-20 DIAGNOSIS — I10 Essential (primary) hypertension: Secondary | ICD-10-CM | POA: Diagnosis not present

## 2019-01-20 MED ORDER — ACETAMINOPHEN-CODEINE #3 300-30 MG PO TABS: 1.0000 | ORAL_TABLET | Freq: Every day | ORAL | 0 refills | Status: DC | PRN

## 2019-01-20 NOTE — Assessment & Plan Note (Signed)
Prediabetes: Diet controlled, last A1c satisfactory HTN: Currently on Diovan, last BMP satisfactory.  Blood pressure today 105/55 with no symptoms of low blood pressure.  No change Paroxysmal atrial fibrillation: Good compliance with Xarelto, on flecainide/diltiazem as needed.  She had few bouts of atrial fibrillation after eye surgery but that is largely resolved Osteopenia: Due for a bone density test, patient is reluctant to proceed due to the quarantine. Hyperparathyroidism: Last PTH elevated, will wait until able to see surgery in person to follow-up with them. Vitamin D deficiency: S/p ergocalciferol, now on daily vitamin D supplements.  Will recheck on RTC MSK, chronic back pain: Refill Tylenol 3.  PDMP reviewed and okay. RTC 3 months, will call and schedule

## 2019-01-20 NOTE — Progress Notes (Signed)
Subjective:    Patient ID: Veronica Bradford, female    DOB: July 29, 1945, 74 y.o.   MRN: [id number]  DOS:  01/20/2019 Type of visit - description: Virtual Visit via Video Note  I connected with the above patient  by a video enabled telemedicine application and verified that I am speaking with the correct person using two identifiers.   THIS ENCOUNTER IS A VIRTUAL VISIT DUE TO COVID-19 - PATIENT WAS NOT SEEN IN THE OFFICE. PATIENT HAS CONSENTED TO VIRTUAL VISIT / TELEMEDICINE VISIT   Location of patient: home  Location of provider: office  I discussed the limitations of evaluation and management by telemedicine and the availability of in person appointments. The patient expressed understanding and agreed to proceed.  Routine office visit Today we talk about osteopenia, vitamin D deficiency, hyperparathyroidism, pain management, HTN, atrial fibrillation.    In general she feels well. No fever chills No chest pain no difficulty breathing Taking very good care of herself with good quarantine precautions.  Review of Systems  See above   Past Medical History:  Diagnosis Date  . Back pain   . Chronic sinusitis    s/p 2 surgeries remotely, [name]  . Closed fracture of metatarsal of left foot    L foot, fifth metatarsal  . Ectopic pregnancy   . Endometrial cancer ([place]) 2004   Early diagnosis  . GERD (gastroesophageal reflux disease)   . Hyperlipemia   . Hypertension   . IBS (irritable bowel syndrome) 08/13/2014   Dx 2015, diarrhea on-off, Dr [name]   . Lumbar radiculopathy 08/2012   MRI in 08/2012  . Mild depression ([place])    Pt's son has Down's Syndrome  . Mild hyperparathyroidism ([place])   . Osteopenia   . PAF (paroxysmal atrial fibrillation) ([place])   . Prediabetes   . Sciatica of right side 08/2013   Received Depomedrol 80 mg injection  . Sleep apnea 09/09/2013   Dx with OSA in 2015, by Dr. Caryl Comes, APAP    Past Surgical History:  Procedure Laterality Date  . ABDOMINAL  HYSTERECTOMY    . ATRIAL FIBRILLATION ABLATION N/A 09/04/2017   Procedure: ATRIAL FIBRILLATION ABLATION;  Surgeon: [name], MD;  Location: [place] [facility];  Service: Cardiovascular;  Laterality: N/A;  . CHOLECYSTECTOMY  ~2006  . DILATION AND CURETTAGE OF UTERUS     after SAB  . ESOPHAGOGASTRODUODENOSCOPY  02/09/2014   Dr. [name]  . FINGER SURGERY Left    index  . NASAL SINUS SURGERY     x 2 remotely, [name]  . TONSILLECTOMY    . TOTAL ABDOMINAL HYSTERECTOMY W/ BILATERAL SALPINGOOPHORECTOMY    . TOTAL KNEE ARTHROPLASTY Right ~2008            Objective:   Physical Exam There were no vitals taken for this visit. This is a virtual video visit, she is alert oriented x3, in no distress.    Assessment      Assessment: Prediabetes HTN Hyperlipidemia Chronic lower extremity edema L>R (eval by previous pcp, related to varicose veins?) CV:  --Paroxysmal atrial fibrillation, a flutter: on xarelto flecainide-diltiazem  prn --Ablation 09/2017 GI: Dr [name]  --GERD (zantac or protonix prn), IBS, chronic diarrhea --GI records: [place], 2004, 2009 8 and 07-2013. Negative for polyps or IBD. + Diverticuli. Next 202 due to [place]. Had a EGD/cscope 11/2016 d --Increased LFTs: CT abdomen 05/2016: Normal liver. 05/2017: WNL  Hepatitis B and C, Alpha 1 antitrypsin, anti-smooth muscle antibody, ANA trans-ferritin  OSA--- CPAP, Dr. Maxwell Caul Osteopenia:  Multiple  Dexas, last two:  T score 2009  -1.1; T score 2015  -0.7; T score 05/2017 -1.8 DJD --Chronic back pain-- tylenol #3 prn --- RF per PCP --history of epidurals in the past, MRI 2014: Scoliosis,DJD --CIPRO pre dental d/t knee replacement (rx by ortho) H/o endometrial cancer, found during a hysterectomy, no chemotherapy or XRT.  Primary hyperparathyroidism: Chronic hypercalcemia, increased PTH 05/2016, refered to ENT  PLAN:  Prediabetes: Diet controlled, last A1c satisfactory HTN: Currently on Diovan, last BMP  satisfactory.  Blood pressure today 105/55 with no symptoms of low blood pressure.  No change Paroxysmal atrial fibrillation: Good compliance with Xarelto, on flecainide/diltiazem as needed.  She had few bouts of atrial fibrillation after eye surgery but that is largely resolved Osteopenia: Due for a bone density test, patient is reluctant to proceed due to the quarantine. Hyperparathyroidism: Last PTH elevated, will wait until able to see surgery in person to follow-up with them. Vitamin D deficiency: S/p ergocalciferol, now on daily vitamin D supplements.  Will recheck on RTC MSK, chronic back pain: Refill Tylenol 3.  PDMP reviewed and okay. RTC 3 months, will call and schedule      I discussed the assessment and treatment plan with the patient. The patient was provided an opportunity to ask questions and all were answered. The patient agreed with the plan and demonstrated an understanding of the instructions.   The patient was advised to call back or seek an in-person evaluation if the symptoms worsen or if the condition fails to improve as anticipated.

## 2019-02-02 ENCOUNTER — Ambulatory Visit: Payer: Medicare Other

## 2019-02-05 ENCOUNTER — Encounter: Payer: Self-pay | Admitting: Internal Medicine

## 2019-02-05 ENCOUNTER — Telehealth (INDEPENDENT_AMBULATORY_CARE_PROVIDER_SITE_OTHER): Payer: Medicare Other | Admitting: Internal Medicine

## 2019-02-05 VITALS — BP 121/57 | HR 59 | Ht 67.0 in | Wt 260.0 lb

## 2019-02-05 DIAGNOSIS — I493 Ventricular premature depolarization: Secondary | ICD-10-CM | POA: Diagnosis not present

## 2019-02-05 DIAGNOSIS — I480 Paroxysmal atrial fibrillation: Secondary | ICD-10-CM

## 2019-02-05 DIAGNOSIS — I10 Essential (primary) hypertension: Secondary | ICD-10-CM | POA: Diagnosis not present

## 2019-02-05 DIAGNOSIS — G4733 Obstructive sleep apnea (adult) (pediatric): Secondary | ICD-10-CM | POA: Diagnosis not present

## 2019-02-05 NOTE — Progress Notes (Signed)
Electrophysiology TeleHealth Note   Due to national recommendations of social distancing due to COVID 19, an audio/video telehealth visit is felt to be most appropriate for this patient at this time.  See MyChart message from today for the patient's consent to telehealth for [facility].  Date:  02/05/2019   ID:  Veronica Bradford, DOB 09-07-1945, MRN [id number]  Location: patient's home  Provider location:  [facility]  Evaluation Performed: Follow-up visit  PCP:  Colon Branch, MD   Electrophysiologist:  Dr Rayann Heman  Chief Complaint:  AF follow up  History of Present Illness:    Veronica Bradford is a 74 y.o. female who presents via telehealth conferencing today.  Since last being seen in our clinic, the patient reports doing reasonably well.  She has been working on weight loss.  She also struggles with IBS.   Today, she denies symptoms of  chest pain, shortness of breath,  lower extremity edema, dizziness, presyncope, or syncope.  The patient is otherwise without complaint today.  The patient denies symptoms of fevers, chills, cough, or new SOB worrisome for COVID 19.  Past Medical History:  Diagnosis Date  . Back pain   . Chronic sinusitis    s/p 2 surgeries remotely, [name]  . Closed fracture of metatarsal of left foot    L foot, fifth metatarsal  . Ectopic pregnancy   . Endometrial cancer ([place]) 2004   Early diagnosis  . GERD (gastroesophageal reflux disease)   . Hyperlipemia   . Hypertension   . IBS (irritable bowel syndrome) 08/13/2014   Dx 2015, diarrhea on-off, Dr [name]   . Lumbar radiculopathy 08/2012   MRI in 08/2012  . Mild depression ([place])    Pt's son has Down's Syndrome  . Mild hyperparathyroidism ([place])   . Osteopenia   . PAF (paroxysmal atrial fibrillation) ([place])   . Prediabetes   . Sciatica of right side 08/2013   Received Depomedrol 80 mg injection  . Sleep apnea 09/09/2013   Dx with OSA in 2015, by Dr. Caryl Comes, APAP    Past Surgical History:    Procedure Laterality Date  . ABDOMINAL HYSTERECTOMY    . ATRIAL FIBRILLATION ABLATION N/A 09/04/2017   Procedure: ATRIAL FIBRILLATION ABLATION;  Surgeon: [name], MD;  Location: [place] [facility];  Service: Cardiovascular;  Laterality: N/A;  . CHOLECYSTECTOMY  ~2006  . DILATION AND CURETTAGE OF UTERUS     after SAB  . ESOPHAGOGASTRODUODENOSCOPY  02/09/2014   Dr. [name]  . FINGER SURGERY Left    index  . NASAL SINUS SURGERY     x 2 remotely, [name]  . TONSILLECTOMY    . TOTAL ABDOMINAL HYSTERECTOMY W/ BILATERAL SALPINGOOPHORECTOMY    . TOTAL KNEE ARTHROPLASTY Right ~2008    Current Outpatient Medications  Medication Sig Dispense Refill  . acetaminophen-codeine (TYLENOL #3) 300-30 MG tablet Take 1 tablet by mouth daily as needed for moderate pain. 30 tablet 0  . Cholecalciferol (VITAMIN D) 2000 units CAPS Take 2,000 Units by mouth daily.     . Chromium Picolinate 1000 MCG TABS Take 1,000 mcg by mouth every evening.    . Coenzyme Q10 (CO Q 10) 100 MG CAPS Take 100 mg by mouth daily.     . Cranberry 600 MG TABS     . diltiazem (CARDIZEM) 30 MG tablet Take 1 tablet every 4 hours AS NEEDED for heart rate >100 as long as blood pressure >100. 45 tablet 5  .  diphenhydrAMINE (BENADRYL ALLERGY) 25 MG tablet PRN for sleep    . flecainide (TAMBOCOR) 150 MG tablet Take 2 tablets (300 mg total) by mouth daily as needed (FOR AFIB). Pill in a pocket 60 tablet 5  . fluticasone (FLONASE) 50 MCG/ACT nasal spray Place 1 spray into both nostrils daily.     Marland Kitchen loperamide (IMODIUM A-D) 2 MG tablet Take 2 mg by mouth as needed for diarrhea or loose stools.    . Lutein 40 MG CAPS     . Magnesium 500 MG TABS Take 1 tablet by mouth daily.    . Multiple Vitamins-Minerals (MULTIVITAMIN PO) Take 1 tablet by mouth daily.    . Omega-3 Fatty Acids (FISH OIL) 1000 MG CAPS Take 1,000 mg by mouth daily.     . pantoprazole (PROTONIX) 40 MG tablet Take 40 mg by mouth daily.    . pravastatin (PRAVACHOL) 40 MG  tablet Take 1 tablet (40 mg total) by mouth at bedtime. 90 tablet 3  . valsartan (DIOVAN) 320 MG tablet Take 0.5 tablets (160 mg total) by mouth 2 (two) times daily. 90 tablet 2  . vitamin B-12 (CYANOCOBALAMIN) 1000 MCG tablet Take 2,000 mcg by mouth every evening.     Alveda Reasons 20 MG TABS tablet TAKE 1 TABLET BY MOUTH  DAILY WITH SUPPER 90 tablet 3   No current facility-administered medications for this visit.    Allergies:   Nsaids, Penicillins, Tolmetin, Zoster vaccine live, and Amoxicillin   Social History:  The patient  reports that she quit smoking about 39 years ago. She has never used smokeless tobacco. She reports current alcohol use. She reports that she does not use drugs.   Family History:  The patient's  family history includes Breast cancer in her mother; Colon cancer in her mother; Diabetes in her father; Head & neck cancer in her sister; Hypertension in her father and mother; Ovarian cancer in her mother.   ROS:  Please see the history of present illness.   All other systems are personally reviewed and negative.    Exam:    Vital Signs:  BP (!) 121/57   Pulse (!) 59   Ht 5\' 7"  (1.702 m)   Wt 260 lb (117.9 kg)   BMI 40.72 kg/m   Well sounding and appearing, alert and conversant, regular work of breathing,  good skin color Eyes- anicteric, neuro- grossly intact, skin- no apparent rash or lesions or cyanosis, mouth- oral mucosa is pink  Labs/Other Tests and Data Reviewed:    Recent Labs: 05/31/2018: ALT 15 09/19/2018: BUN 11; Creatinine, Ser 0.66; Hemoglobin 13.7; Platelets 226.0; Potassium 4.7; Sodium 142; TSH 1.14   Wt Readings from Last 3 Encounters:  02/05/19 260 lb (117.9 kg)  11/07/18 263 lb (119.3 kg)  10/09/18 267 lb 1.3 oz (121.1 kg)     EKG's from [facility] reviewed   ASSESSMENT & PLAN:    1.  Paroxysmal atrial fibrillation/flutter/PVCs S/p PVI/CTI 2019 Recent Kardia strips reviewed which show SR, rare PVCs Continue pill in the pocket  flecainide. With baseline conduction system disease, would avoid daily flecainide  2.  OSA Compliant with CPAP  3.  HTN Stable No change required today  4.  Obesity She has been working on weight loss She is down 7 pounds since October Congratulated on weight loss!  Encouraged to continue efforts     Follow-up:  AF clinic in 3 months    Patient Risk:  after full review of this patients clinical status,  I feel that they are at moderate risk at this time.  Today, I have spent 15 minutes with the patient with telehealth technology discussing arrhythmia management .    Army Fossa, MD  02/05/2019 12:27 PM     [place] [address] [place] [address] [phone] (office) [phone] (fax)

## 2019-02-09 ENCOUNTER — Ambulatory Visit: Payer: Medicare Other | Attending: Internal Medicine

## 2019-02-09 DIAGNOSIS — Z23 Encounter for immunization: Secondary | ICD-10-CM

## 2019-02-09 NOTE — Progress Notes (Signed)
   Covid-19 Vaccination Clinic  Name:  ANARIA SIEKER    MRN: [id number] DOB: 1945/07/16  02/09/2019  Ms. Beckert was observed post Covid-19 immunization for 15 minutes without incidence. She was provided with Vaccine Information Sheet and instruction to access the V-Safe system.   Ms. Lazer was instructed to call 911 with any severe reactions post vaccine: Marland Kitchen Difficulty breathing  . Swelling of your face and throat  . A fast heartbeat  . A bad rash all over your body  . Dizziness and weakness    Immunizations Administered    Name Date Dose VIS Date Route   Pfizer COVID-19 Vaccine 02/09/2019  2:08 PM 0.3 mL 12/13/2018 Intramuscular   Manufacturer: [place]   Lot: [id number]   [place]: [id number]

## 2019-02-10 ENCOUNTER — Telehealth: Payer: Medicare Other | Admitting: Internal Medicine

## 2019-02-13 ENCOUNTER — Ambulatory Visit: Payer: Medicare Other

## 2019-03-06 ENCOUNTER — Ambulatory Visit: Payer: Medicare Other | Attending: Internal Medicine

## 2019-03-06 DIAGNOSIS — Z23 Encounter for immunization: Secondary | ICD-10-CM | POA: Insufficient documentation

## 2019-03-06 NOTE — Progress Notes (Signed)
   Covid-19 Vaccination Clinic  Name:  Veronica Bradford    MRN: [id number] DOB: [birth year]  03/06/2019  [name] was observed post Covid-19 immunization for 15 minutes without incident. She was provided with Vaccine Information Sheet and instruction to access the V-Safe system.   [name] was instructed to call 911 with any severe reactions post vaccine: Marland Kitchen Difficulty breathing  . Swelling of face and throat  . A fast heartbeat  . A bad rash all over body  . Dizziness and weakness   Immunizations Administered    Name Date Dose VIS Date Route   Pfizer COVID-19 Vaccine 03/06/2019  9:02 AM 0.3 mL 12/13/2018 Intramuscular   Manufacturer: [place]   Lot: [id number]   [place]: [id number]

## 2019-03-25 DIAGNOSIS — H35371 Puckering of macula, right eye: Secondary | ICD-10-CM | POA: Diagnosis not present

## 2019-03-25 DIAGNOSIS — H43811 Vitreous degeneration, right eye: Secondary | ICD-10-CM | POA: Diagnosis not present

## 2019-03-27 ENCOUNTER — Telehealth: Payer: Self-pay

## 2019-03-27 DIAGNOSIS — E349 Endocrine disorder, unspecified: Secondary | ICD-10-CM | POA: Diagnosis not present

## 2019-03-27 DIAGNOSIS — E210 Primary hyperparathyroidism: Secondary | ICD-10-CM | POA: Diagnosis not present

## 2019-03-28 ENCOUNTER — Encounter: Payer: Self-pay | Admitting: Internal Medicine

## 2019-03-28 ENCOUNTER — Other Ambulatory Visit: Payer: Self-pay

## 2019-03-28 ENCOUNTER — Telehealth (INDEPENDENT_AMBULATORY_CARE_PROVIDER_SITE_OTHER): Payer: Medicare Other | Admitting: Internal Medicine

## 2019-03-28 VITALS — BP 102/60 | Ht 67.0 in

## 2019-03-28 DIAGNOSIS — I10 Essential (primary) hypertension: Secondary | ICD-10-CM

## 2019-03-28 DIAGNOSIS — G5603 Carpal tunnel syndrome, bilateral upper limbs: Secondary | ICD-10-CM | POA: Diagnosis not present

## 2019-03-28 NOTE — Assessment & Plan Note (Signed)
CTS: SX consistent with CTS, doubt radiculopathy.  Symptoms are acutely worse and she is having some motor weakness.  After a long conversation we agreed on: -Start to use CTS splinters bilaterally, at night. -Refer to Ortho hand. Had questions about complications and yes in the long-term she could get permanent nerve damage. HTN: Slightly low at times, she takes valsartan 0.5 tablets B.I.D. and occasionally skip it if the blood pressure is too low.  Further adjustment on RTC Primary hyperparathyroidism: To see surgery soon. RTC 3 months, will call and arrange

## 2019-03-28 NOTE — Progress Notes (Signed)
Pre visit review using our clinic review tool, if applicable. No additional management support is needed unless otherwise documented below in the visit note. 

## 2019-03-28 NOTE — Progress Notes (Signed)
Subjective:    Patient ID: Veronica Bradford, female    DOB: May 31, 1945, 74 y.o.   MRN: [id number]  DOS:  03/28/2019 Type of visit - description: Virtual Visit via Video Note  I connected with the above patient  by a video enabled telemedicine application and verified that I am speaking with the correct person using two identifiers.   THIS ENCOUNTER IS A VIRTUAL VISIT DUE TO COVID-19 - PATIENT WAS NOT SEEN IN THE OFFICE. PATIENT HAS CONSENTED TO VIRTUAL VISIT / TELEMEDICINE VISIT   Location of patient: home  Location of provider: office  I discussed the limitations of evaluation and management by telemedicine and the availability of in person appointments. The patient expressed understanding and agreed to proceed.  Acute Has a long history of bilateral hand numbness, worse in the last 10 days, R>L Has numbness, tingling at the first second and third fingers. She uses her hands a lot doing crafting. At times she has some weakness and is unable to open bottles with her right hand.  Denies neck pain or elbow pain. No hand or wrist swelling     Review of Systems See above   Past Medical History:  Diagnosis Date  . Back pain   . Chronic sinusitis    s/p 2 surgeries remotely, [name]  . Closed fracture of metatarsal of left foot    L foot, fifth metatarsal  . Ectopic pregnancy   . Endometrial cancer ([place]) 2004   Early diagnosis  . GERD (gastroesophageal reflux disease)   . Hyperlipemia   . Hypertension   . IBS (irritable bowel syndrome) 08/13/2014   Dx 2015, diarrhea on-off, Dr [name]   . Lumbar radiculopathy 08/2012   MRI in 08/2012  . Mild depression ([place])    Pt's son has Down's Syndrome  . Mild hyperparathyroidism ([place])   . Osteopenia   . PAF (paroxysmal atrial fibrillation) ([place])   . Prediabetes   . Sciatica of right side 08/2013   Received Depomedrol 80 mg injection  . Sleep apnea 09/09/2013   Dx with OSA in 2015, by Dr. Caryl Comes, APAP    Past Surgical History:   Procedure Laterality Date  . ABDOMINAL HYSTERECTOMY    . ATRIAL FIBRILLATION ABLATION N/A 09/04/2017   Procedure: ATRIAL FIBRILLATION ABLATION;  Surgeon: [name], MD;  Location: [facility];  Service: Cardiovascular;  Laterality: N/A;  . CHOLECYSTECTOMY  ~2006  . DILATION AND CURETTAGE OF UTERUS     after SAB  . ESOPHAGOGASTRODUODENOSCOPY  02/09/2014   Dr. [name]  . FINGER SURGERY Left    index  . NASAL SINUS SURGERY     x 2 remotely, [name]  . TONSILLECTOMY    . TOTAL ABDOMINAL HYSTERECTOMY W/ BILATERAL SALPINGOOPHORECTOMY    . TOTAL KNEE ARTHROPLASTY Right ~2008    Allergies as of 03/28/2019      Reactions   Nsaids Other (See Comments)   Pt on Xarelto   Penicillins    Local reaction   Tolmetin Other (See Comments)   Pt on Xarelto   Zoster Vaccine Live Other (See Comments)   Sickness per Pt.    Amoxicillin Rash      Medication List       Accurate as of March 28, 2019  4:49 PM. If you have any questions, ask your nurse or doctor.        STOP taking these medications   Vitamin D 50 MCG (2000 UT) Caps Stopped by: Kathlene November, MD  TAKE these medications   acetaminophen-codeine 300-30 MG tablet Commonly known as: TYLENOL #3 Take 1 tablet by mouth daily as needed for moderate pain.   Benadryl Allergy 25 MG tablet Generic drug: diphenhydrAMINE PRN for sleep   Chromium Picolinate 1000 MCG Tabs Take 1,000 mcg by mouth every evening.   Co Q 10 100 MG Caps Take 100 mg by mouth daily.   Cranberry 600 MG Tabs   diltiazem 30 MG tablet Commonly known as: Cardizem Take 1 tablet every 4 hours AS NEEDED for heart rate >100 as long as blood pressure >100.   Fish Oil 1000 MG Caps Take 1,000 mg by mouth daily.   flecainide 150 MG tablet Commonly known as: TAMBOCOR Take 2 tablets (300 mg total) by mouth daily as needed (FOR AFIB). Pill in a pocket   fluticasone 50 MCG/ACT nasal spray Commonly known as: FLONASE Place 1 spray into both nostrils daily.    loperamide 2 MG tablet Commonly known as: IMODIUM A-D Take 2 mg by mouth as needed for diarrhea or loose stools.   Lutein 40 MG Caps   Magnesium 500 MG Tabs Take 1 tablet by mouth daily.   MULTIVITAMIN PO Take 1 tablet by mouth daily.   omeprazole 20 MG capsule Commonly known as: PRILOSEC Take 20 mg by mouth daily.   pantoprazole 40 MG tablet Commonly known as: PROTONIX Take 40 mg by mouth daily.   pravastatin 40 MG tablet Commonly known as: PRAVACHOL Take 1 tablet (40 mg total) by mouth at bedtime.   valsartan 320 MG tablet Commonly known as: DIOVAN Take 0.5 tablets (160 mg total) by mouth 2 (two) times daily.   vitamin B-12 1000 MCG tablet Commonly known as: CYANOCOBALAMIN Take 2,000 mcg by mouth every evening.   Xarelto 20 MG Tabs tablet Generic drug: rivaroxaban TAKE 1 TABLET BY MOUTH  DAILY WITH SUPPER          Objective:   Physical Exam BP 102/60   Ht 5\' 7"  (1.702 m)   BMI 40.72 kg/m  This is a virtual video visit, she is alert oriented x3, in no apparent distress    Assessment     Assessment: Prediabetes HTN Hyperlipidemia Chronic lower extremity edema L>R (eval by previous pcp, related to varicose veins?) CV:  --Paroxysmal atrial fibrillation, a flutter: on xarelto flecainide-diltiazem  prn --Ablation 09/2017 GI: Dr [name]  --GERD (zantac or protonix prn), IBS, chronic diarrhea --GI records: [facility], 2004, 2009 8 and 07-2013. Negative for polyps or IBD. + Diverticuli. Next 202 due to [place]. Had a EGD/cscope 11/2016 d --Increased LFTs: CT abdomen 05/2016: Normal liver. 05/2017: WNL  Hepatitis B and C, Alpha 1 antitrypsin, anti-smooth muscle antibody, ANA trans-ferritin  OSA--- CPAP, Dr. Maxwell Caul Osteopenia:  Multiple  Dexas, last two:  T score 2009  -1.1; T score 2015  -0.7; T score 05/2017 -1.8 DJD --Chronic back pain-- tylenol #3 prn --- RF per PCP --history of epidurals in the past, MRI 2014: Scoliosis,DJD --CIPRO pre dental d/t knee  replacement (rx by ortho) H/o endometrial cancer, found during a hysterectomy, no chemotherapy or XRT.  Primary hyperparathyroidism: Chronic hypercalcemia, increased PTH 05/2016, refered to ENT  PLAN:  CTS: SX consistent with CTS, doubt radiculopathy.  Symptoms are acutely worse and she is having some motor weakness.  After a long conversation we agreed on: -Start to use CTS splinters bilaterally, at night. -Refer to Ortho hand. Had questions about complications and yes in the long-term she could get permanent nerve damage.  HTN: Slightly low at times, she takes valsartan 0.5 tablets B.I.D. and occasionally skip it if the blood pressure is too low.  Further adjustment on RTC Primary hyperparathyroidism: To see surgery soon. RTC 3 months, will call and arrange   I discussed the assessment and treatment plan with the patient. The patient was provided an opportunity to ask questions and all were answered. The patient agreed with the plan and demonstrated an understanding of the instructions.   The patient was advised to call back or seek an in-person evaluation if the symptoms worsen or if the condition fails to improve as anticipated.

## 2019-04-03 DIAGNOSIS — E210 Primary hyperparathyroidism: Secondary | ICD-10-CM | POA: Diagnosis not present

## 2019-04-03 DIAGNOSIS — E349 Endocrine disorder, unspecified: Secondary | ICD-10-CM | POA: Diagnosis not present

## 2019-04-23 DIAGNOSIS — Z961 Presence of intraocular lens: Secondary | ICD-10-CM | POA: Diagnosis not present

## 2019-05-07 ENCOUNTER — Ambulatory Visit (HOSPITAL_COMMUNITY)
Admission: RE | Admit: 2019-05-07 | Discharge: 2019-05-07 | Disposition: A | Discharge status: Home / Self Care | Payer: Medicare Other | Source: Ambulatory Visit | Attending: Nurse Practitioner | Admitting: Nurse Practitioner

## 2019-05-07 ENCOUNTER — Encounter (HOSPITAL_COMMUNITY): Payer: Self-pay | Admitting: Nurse Practitioner

## 2019-05-07 ENCOUNTER — Other Ambulatory Visit: Payer: Self-pay

## 2019-05-07 VITALS — BP 140/56 | HR 54 | Ht 67.0 in | Wt 258.0 lb

## 2019-05-07 DIAGNOSIS — I480 Paroxysmal atrial fibrillation: Secondary | ICD-10-CM | POA: Diagnosis not present

## 2019-05-07 DIAGNOSIS — D6869 Other thrombophilia: Secondary | ICD-10-CM | POA: Diagnosis not present

## 2019-05-07 NOTE — Progress Notes (Signed)
This visit is being conducted via phone call  - after an attmept to do on video chat - due to the COVID-19 pandemic. This patient has given me verbal consent via phone to conduct this visit, patient states they are participating from their home address. Some vital signs may be absent or patient reported.   Patient identification: identified by name, DOB, and current address.   Subjective:   Veronica Bradford is a 74 y.o. female who presents for Medicare Annual (Subsequent) preventive examination.  Pt is retired Therapist, sports.  Review of Systems:  Home Safety/Smoke Alarms: Feels safe in home. Smoke alarms in place.  Lives with husband and son in 2 story home.  Female:   Mammo- 09/16/18      Dexa scan- 05/03/17. Declines today due to pandemic.    CCS- 11/03/16. Recall 5 yrs.      Objective:     Vitals: Unable to assess. This visit is enabled though telemedicine due to Covid 19.   Advanced Directives 05/08/2019 05/06/2018 10/18/2017 09/04/2017 05/03/2017 12/13/2016 05/02/2016  Does Patient Have a Medical Advance Directive? No No No Yes Yes Yes Yes  Type of Advance Directive - - Public librarian;Living will [place];Living will [place];Living will [place];Living will  Does patient want to make changes to medical advance directive? - - - No - Patient declined No - Patient declined - -  Copy of [place] in Chart? - - - No - copy requested No - copy requested - No - copy requested  Would patient like information on creating a medical advance directive? No - Patient declined No - Patient declined - - - - -    Tobacco Social History   Tobacco Use  Smoking Status Former Smoker  . Quit date: 01/06/1980  . Years since quitting: 39.3  Smokeless Tobacco Never Used  Tobacco Comment   smoked from 1970 to 1982, less than 1 ppd     Counseling given: Not Answered Comment: smoked from [place] to 1982, less than 1 ppd    Clinical Intake: Pain : No/denies pain     Past Medical History:  Diagnosis Date  . Back pain   . Chronic sinusitis    s/p 2 surgeries remotely, [name]  . Closed fracture of metatarsal of left foot    L foot, fifth metatarsal  . Ectopic pregnancy   . Endometrial cancer ([place]) 2004   Early diagnosis  . GERD (gastroesophageal reflux disease)   . Hyperlipemia   . Hypertension   . IBS (irritable bowel syndrome) 08/13/2014   Dx 2015, diarrhea on-off, Dr [name]   . Lumbar radiculopathy 08/2012   MRI in 08/2012  . Mild depression ([place])    Pt's son has Down's Syndrome  . Mild hyperparathyroidism ([place])   . Osteopenia   . PAF (paroxysmal atrial fibrillation) ([place])   . Prediabetes   . Sciatica of right side 08/2013   Received Depomedrol 80 mg injection  . Sleep apnea 09/09/2013   Dx with OSA in 2015, by Dr. Caryl Comes, APAP   Past Surgical History:  Procedure Laterality Date  . ABDOMINAL HYSTERECTOMY    . ATRIAL FIBRILLATION ABLATION N/A 09/04/2017   Procedure: ATRIAL FIBRILLATION ABLATION;  Surgeon: [name], MD;  Location: [facility];  Service: Cardiovascular;  Laterality: N/A;  . CHOLECYSTECTOMY  ~2006  . DILATION AND CURETTAGE OF UTERUS     after SAB  . ESOPHAGOGASTRODUODENOSCOPY  02/09/2014   Dr. [name]  . FINGER SURGERY Left    index  . NASAL SINUS SURGERY     x 2 remotely, [name]  . TONSILLECTOMY    . TOTAL ABDOMINAL HYSTERECTOMY W/ BILATERAL SALPINGOOPHORECTOMY    . TOTAL KNEE ARTHROPLASTY Right ~2008   Family History  Problem Relation Age of Onset  . Hypertension Mother   . Colon cancer Mother   . Breast cancer Mother   . Ovarian cancer Mother   . Hypertension Father   . Diabetes Father   . Head & neck cancer Sister   . CAD Neg Hx    Social History   Socioeconomic History  . Marital status: Married    Spouse name: Not on file  . Number of children: 1  . Years of education: Not on file  . Highest education level: Not on file  Occupational  History  . 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 Smoking status: Former Smoker    Quit date: 01/06/1980    Years since quitting: 39.3  . Smokeless tobacco: Never Used  . Tobacco comment: smoked from 1970 to 1982, less than 1 ppd  Substance and Sexual Activity  . Alcohol use: Yes    Comment: rare  . Drug use: No  . Sexual activity: Not Currently    Birth control/protection: None  Other Topics Concern  . Not on file  Social History Narrative   Lives w/ husband, and son [name]   Retired Marine scientist   [place]ives in [place] Strain: [place]   . Difficulty of Paying Living Expenses: Not hard at all  Food Insecurity: No Food Insecurity  . Worried About Charity fundraiser in the Last Year: Never true  . Ran Out of Food in the Last Year: Never true  Transportation Needs: No Transportation Needs  . Lack of Transportation (Medical): No  . Lack of Transportation (Non-Medical): No  Physical Activity:   . Days of Exercise per Week:   . Minutes of Exercise per Session:   Stress:   . Feeling of Stress :   Social Connections:   . Frequency of Communication with Friends and Family:   . Frequency of Social Gatherings with Friends and Family:   . Attends Religious Services:   . Active Member of Clubs or Organizations:   . Attends Archivist Meetings:   Marland Kitchen Marital Status:     Outpatient Encounter Medications as of 05/08/2019  Medication Sig  . acetaminophen (TYLENOL) 325 MG tablet Take 325 mg by mouth as needed.  Marland Kitchen acetaminophen-codeine (TYLENOL #3) 300-30 MG tablet Take 1 tablet by mouth daily as needed for moderate pain.  . Chromium Picolinate 1000 MCG TABS Take 1,000 mcg by mouth every evening.  . Coenzyme Q10 (CO Q 10) 100 MG CAPS Take 100 mg by mouth daily.   . Cranberry 600 MG TABS   . diltiazem (CARDIZEM) 30 MG tablet Take 1 tablet every 4 hours AS NEEDED for heart rate >100 as long as blood pressure >100.  .  flecainide (TAMBOCOR) 150 MG tablet Take 2 tablets (300 mg total) by mouth daily as needed (FOR AFIB). Pill in a pocket  . fluticasone (FLONASE) 50 MCG/ACT nasal spray Place 1 spray into both nostrils daily.   Marland Kitchen loperamide (IMODIUM A-D) 2 MG tablet Take 2 mg by mouth daily.   . Lutein 40 MG CAPS Taking one capsule by mouth daily  .  Magnesium 400 MG TABS Taking 400mg  of magnesium oxide by mouth in the am and the other magnesium tablet 250mg  at night  . Omega-3 Fatty Acids (FISH OIL) 1000 MG CAPS Take 1,000 mg by mouth daily.   Marland Kitchen omeprazole (PRILOSEC) 20 MG capsule Take 20 mg by mouth daily.  . pravastatin (PRAVACHOL) 40 MG tablet Take 1 tablet (40 mg total) by mouth at bedtime.  . valsartan (DIOVAN) 320 MG tablet Take 0.5 tablets (160 mg total) by mouth 2 (two) times daily.  . vitamin B-12 (CYANOCOBALAMIN) 1000 MCG tablet Take 2,000 mcg by mouth every evening.   Alveda Reasons 20 MG TABS tablet TAKE 1 TABLET BY MOUTH  DAILY WITH SUPPER  . [DISCONTINUED] diphenhydrAMINE (BENADRYL ALLERGY) 25 MG tablet PRN for sleep  . [DISCONTINUED] Multiple Vitamins-Minerals (MULTIVITAMIN PO) Take 1 tablet by mouth daily.  . [DISCONTINUED] pantoprazole (PROTONIX) 40 MG tablet Take 40 mg by mouth daily.   No facility-administered encounter medications on file as of 05/08/2019.    Activities of Daily Living In your present state of health, do you have any difficulty performing the following activities: 05/08/2019  Hearing? N  Vision? N  Difficulty concentrating or making decisions? N  Walking or climbing stairs? N  Dressing or bathing? N  Doing errands, shopping? N  Preparing Food and eating ? N  Using the Toilet? N  In the past six months, have you accidently leaked urine? N  Do you have problems with loss of bowel control? N  Managing your Medications? N  Managing your Finances? N  Housekeeping or managing your Housekeeping? N  Some recent data might be hidden    Patient Care Team: Colon Branch, MD as PCP -  General (Internal Medicine) Marius Ditch, MD as Consulting Physician (Internal Medicine) [name], MD as Consulting Physician (Cardiology) Wylene Simmer, MD as Consulting Physician (Orthopedic Surgery) Armandina Gemma, MD as Consulting Physician (General Surgery) [name], MD as Consulting Physician (Gastroenterology) [place], Heather, [place] as Consulting Physician (Optometry) Sherlynn Stalls, MD as Consulting Physician (Ophthalmology)    Assessment:   This is a routine wellness examination for [place]. Physical assessment deferred to PCP.   Exercise Activities and Dietary recommendations Current Exercise Habits: Home exercise routine, Type of exercise: walking, Time (Minutes): 10, Frequency (Times/Week): 5, Weekly Exercise (Minutes/Week): 50, Intensity: Mild, Exercise limited by: None identified   Diet (meal preparation, eat out, water intake, caffeinated beverages, dairy products, fruits and vegetables): in general, a "healthy" diet  , well balanced pt follows a low sodium diet.   Goals    . Weight (lb) < 225 lb (102.1 kg) (pt-stated)     With diet and exercise        Fall Risk Fall Risk  05/08/2019 09/19/2018 05/06/2018 05/03/2017 05/02/2016  Falls in the past year? 0 0 0 No No  Number falls in past yr: 0 - - - -  Injury with Fall? 0 - - - -  Follow up Education provided;Falls prevention discussed Falls evaluation completed - - -   Depression Screen PHQ 2/9 Scores 05/08/2019 05/06/2018 05/03/2017 05/02/2016  PHQ - 2 Score 0 0 0 0     Cognitive Function Ad8 score reviewed for issues:  Issues making decisions:no  Less interest in hobbies / activities:no  Repeats questions, stories (family complaining):no  Trouble using ordinary gadgets (microwave, computer, phone):no  Forgets the month or year: no  Mismanaging finances: no  Remembering appts:no  Daily problems with thinking and/or memory:no Ad8 score is=0  Immunization History  Administered Date(s)  Administered  . Fluad Quad(high Dose 65+) 09/19/2018  . Influenza, High Dose Seasonal PF 10/01/2014, 10/19/2015, 11/10/2016, 10/03/2017  . Influenza-Unspecified 10/19/2010, 09/09/2013  . PFIZER SARS-COV-2 Vaccination 02/09/2019, 03/06/2019  . Pneumococcal Conjugate-13 03/07/2013  . Pneumococcal Polysaccharide-23 09/09/2013  . Tdap 07/26/2011  . Zoster 11/12/2012     Screening Tests Health Maintenance  Topic Date Due  . DEXA SCAN  05/04/2018  . INFLUENZA VACCINE  08/03/2019  . MAMMOGRAM  09/16/2019  . TETANUS/TDAP  07/25/2021  . COLONOSCOPY  11/03/2021  . COVID-19 Vaccine  Completed  . Hepatitis C Screening  Completed  . PNA vac Low Risk Adult  Completed      Plan:    Please schedule your next medicare wellness visit with me in 1 yr.  Continue to eat heart healthy diet (full of fruits, vegetables, whole grains, lean protein, water--limit salt, fat, and sugar intake) and increase physical activity as tolerated.   Continue doing brain stimulating activities (puzzles, reading, adult coloring books, staying active) to keep memory sharp.   Bring a copy of your living will and/or healthcare power of attorney to your next office visit.   I have personally reviewed and noted the following in the patient's chart:   . Medical and social history . Use of alcohol, tobacco or illicit drugs  . Current medications and supplements . Functional ability and status . Nutritional status . Physical activity . Advanced directives . List of other physicians . Hospitalizations, surgeries, and ER visits in previous 12 months . Vitals . Screenings to include cognitive, depression, and falls . Referrals and appointments  In addition, I have reviewed and discussed with patient certain preventive protocols, quality metrics, and best practice recommendations. A written personalized care plan for preventive services as well as general preventive health recommendations were provided to patient.      Shela Nevin, [place]  05/08/2019   PCP note: Pt states she has had you look at veins in her legs at previous appt and  she would like referral to Vein and vascular specialist.

## 2019-05-07 NOTE — Progress Notes (Signed)
Electrophysiology TeleHealth Note   Due to national recommendations of social distancing due to COVID 19, Video telehealth visit is felt to be most appropriate for this patient at this time.  See MyChart message/consent below  from today for patient consent regarding telehealth for the [facility].    Date:  05/07/2019   ID:  Veronica Bradford, DOB [birth year], MRN [id number]  Location: home   Provider location: [address] [place], [place] [address] Evaluation Performed:  Follow up afib  PCP:  Colon Branch, MD  Primary Cardiologist:  Dr.Klein  Primary Electrophysiologist: Dr. Rayann Heman  CC: Paroxysmal afib     History of Present Illness: Veronica Bradford is a 74 y.o. female who presents via audio/video conferencing for a telehealth visit today.   The patient is referred for 3 month f/u regarding afib  by Dr Rayann Heman. She reports afib x 2 in March, related to her covid shot. Both episodes were over in 4 hours. She  also had an area to bleed on her lower leg possibly a small varicosity that burst. She has chronic  fatigue that is present on most days and does not correlate with afib episodes.My chart Kardia strips reviewed.They show S brady.  She uses CPAP.   Today, she denies symptoms of palpitations, chest pain, shortness of breath, orthopnea, PND, lower extremity edema, claudication, dizziness, presyncope, syncope, bleeding, or neurologic sequela. The patient is tolerating medications without difficulties and is otherwise without complaint today+ fatigue .   she denies symptoms of cough, fevers, chills, or new SOB worrisome for COVID 19.  She has had both covid shots.   Atrial Fibrillation Risk Factors:  she does have symptoms or diagnosis of sleep apnea. she is compliant with CPAP therapy. she does not have a history of rheumatic fever. she does not have a history of alcohol use. The patient does not have a history of early familial atrial fibrillation or other  arrhythmias.  she has a BMI of Body mass index is 40.41 kg/m.Marland Kitchen Filed Weights   05/07/19 1011  Weight: 117 kg    Past Medical History:  Diagnosis Date  . Back pain   . Chronic sinusitis    s/p 2 surgeries remotely, [name]  . Closed fracture of metatarsal of left foot    L foot, fifth metatarsal  . Ectopic pregnancy   . Endometrial cancer ([facility]) 2004   Early diagnosis  . GERD (gastroesophageal reflux disease)   . Hyperlipemia   . Hypertension   . IBS (irritable bowel syndrome) 08/13/2014   Dx 2015, diarrhea on-off, Dr [name]   . Lumbar radiculopathy 08/2012   MRI in 08/2012  . 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 Mild hyperparathyroidism ([facility])   . Osteopenia   . PAF (paroxysmal atrial fibrillation) ([facility])   . Prediabetes   . Sciatica of right side 08/2013   Received Depomedrol 80 mg injection  . Sleep apnea 09/09/2013   Dx with OSA in 2015, by Dr. Caryl Comes, APAP   Past Surgical History:  Procedure Laterality Date  . ABDOMINAL HYSTERECTOMY    . ATRIAL FIBRILLATION ABLATION N/A 09/04/2017   Procedure: ATRIAL FIBRILLATION ABLATION;  Surgeon: [name], MD;  Location: [facility];  Service: Cardiovascular;  Laterality: N/A;  . CHOLECYSTECTOMY  ~2006  . DILATION AND CURETTAGE OF UTERUS     after SAB  . ESOPHAGOGASTRODUODENOSCOPY  02/09/2014   Dr. [name]  . FINGER SURGERY  Left    index  . NASAL SINUS SURGERY     x 2 remotely, [name]  . TONSILLECTOMY    . TOTAL ABDOMINAL HYSTERECTOMY W/ BILATERAL SALPINGOOPHORECTOMY    . TOTAL KNEE ARTHROPLASTY Right ~2008     Current Outpatient Medications  Medication Sig Dispense Refill  . acetaminophen (TYLENOL) 325 MG tablet Take 325 mg by mouth as needed.    Marland Kitchen acetaminophen-codeine (TYLENOL #3) 300-30 MG tablet Take 1 tablet by mouth daily as needed for moderate pain. 30 tablet 0  . Chromium Picolinate 1000 MCG TABS Take 1,000 mcg by mouth every evening.    . Coenzyme Q10 (CO Q 10) 100 MG CAPS Take 100  mg by mouth daily.     Marland Kitchen diltiazem (CARDIZEM) 30 MG tablet Take 1 tablet every 4 hours AS NEEDED for heart rate >100 as long as blood pressure >100. 45 tablet 5  . flecainide (TAMBOCOR) 150 MG tablet Take 2 tablets (300 mg total) by mouth daily as needed (FOR AFIB). Pill in a pocket 60 tablet 5  . fluticasone (FLONASE) 50 MCG/ACT nasal spray Place 1 spray into both nostrils daily.     Marland Kitchen loperamide (IMODIUM A-D) 2 MG tablet Take 2 mg by mouth daily.     . Lutein 40 MG CAPS Taking one capsule by mouth daily    . Magnesium 400 MG TABS Taking 400mg  of magnesium oxide by mouth in the am and the other magnesium tablet at night    . Omega-3 Fatty Acids (FISH OIL) 1000 MG CAPS Take 1,000 mg by mouth daily.     Marland Kitchen omeprazole (PRILOSEC) 20 MG capsule Take 20 mg by mouth daily.    . pravastatin (PRAVACHOL) 40 MG tablet Take 1 tablet (40 mg total) by mouth at bedtime. 90 tablet 3  . valsartan (DIOVAN) 320 MG tablet Take 0.5 tablets (160 mg total) by mouth 2 (two) times daily. 90 tablet 2  . vitamin B-12 (CYANOCOBALAMIN) 1000 MCG tablet Take 2,000 mcg by mouth every evening.     Alveda Reasons 20 MG TABS tablet TAKE 1 TABLET BY MOUTH  DAILY WITH SUPPER 90 tablet 3  . Cranberry 600 MG TABS      No current facility-administered medications for this encounter.    Allergies:   Nsaids, Penicillins, Tolmetin, Zoster vaccine live, and Amoxicillin   Social History:  The patient  reports that she quit smoking about 39 years ago. She has never used smokeless tobacco. She reports current alcohol use. She reports that she does not use drugs.   Family History:  The patient's  family history includes Breast cancer in her mother; Colon cancer in her mother; Diabetes in her father; Head & neck cancer in her sister; Hypertension in her father and mother; Ovarian cancer in her mother.    ROS:  Please see the history of present illness.   All other systems are personally reviewed and negative.   Exam: Well appearing, alert  and conversant, regular work of breathing,  good skin color Poor quality of audio/video   Recent Labs: 05/31/2018: ALT 15 09/19/2018: BUN 11; Creatinine, Ser 0.66; Hemoglobin 13.7; Platelets 226.0; Potassium 4.7; Sodium 142; TSH 1.14  personally reviewed    Other studies personally reviewed: Additional studies/ records that were reviewed today include: Kardia strips   Review of the above records today demonstrates: Sinus brady in the 50's    The patient presents wearable device technology report for my review today. kardia strips as above  ASSESSMENT AND PLAN:  1.   Paroxysmal  atrial fibrillation Appears  to be low burden  Has flecainide PIP to use if needed  Last episode was related to covid shot  Continue xarelto 20 mg daily   This patients CHA2DS2-VASc Score and unadjusted Ischemic Stroke Rate (% per year) is equal to 3.2 % stroke rate/year from a score of 3  Above score calculated as 1 point each if present [CHF, HTN, DM, Vascular=MI/PAD/Aortic Plaque, Age if 65-74, or Female] Above score calculated as 2 points each if present [Age > 75, or Stroke/TIA/TE]  2. HTN Stable    3. LE varicosities  May benefit seeing a vein and vascular specialist  Asked to check with PCP to see if referral needed from them  She has a wellness visit tomorrow   COVID screen The patient does not have any symptoms that suggest any further testing/ screening at this time.  Social distancing reinforced today.   She has had both covid shots   Follow-up:   3 months with Dr. Rayann Heman   Current medicines are reviewed   with the patient today.   The patient does not have concerns regarding her medicines.  The following changes were made today:  none  Labs/ tests ordered today include: none  No orders of the defined types were placed in this encounter.   Patient Risk:  after full review of this patients clinical status, I feel that they are at moderate  risk at this time.   Today, I have  spent  15  minutes with the patient with telehealth technology discussing above issues   Signed, Roderic Palau NP  05/07/2019 11:36 AM  Afib [facility] [address] [place], [place] [address] [phone]   I hereby voluntarily request, consent and authorize the [facility] and its employed or contracted physicians, physician assistants, nurse practitioners or other licensed health care professionals (the Practitioner), to provide me with telemedicine health care services (the "Services") as deemed necessary by the treating Practitioner. I acknowledge and consent to receive the Services by the Practitioner via telemedicine. I understand that the telemedicine visit will involve communicating with the Practitioner through live audiovisual communication technology and the disclosure of certain medical information by electronic transmission. I acknowledge that I have been given the opportunity to request an in-person assessment or other available alternative prior to the telemedicine visit and am voluntarily participating in the telemedicine visit.   I understand that I have the right to withhold or withdraw my consent to the use of telemedicine in the course of my care at any time, without affecting my right to future care or treatment, and that the Practitioner or I may terminate the telemedicine visit at any time. I understand that I have the right to inspect all information obtained and/or recorded in the course of the telemedicine visit and may receive copies of available information for a reasonable fee.  I understand that some of the potential risks of receiving the Services via telemedicine include:   Delay or interruption in medical evaluation due to technological equipment failure or disruption;  Information transmitted may not be sufficient (e.g. poor resolution of images) to allow for appropriate medical decision making by the Practitioner; and/or  In rare  instances, security protocols could fail, causing a breach of personal health information.   Furthermore, I acknowledge that it is my responsibility to provide information about my medical history, conditions and care that is complete and accurate to  the best of my ability. I acknowledge that Practitioner's advice, recommendations, and/or decision may be based on factors not within their control, such as incomplete or inaccurate data provided by me or distortions of diagnostic images or specimens that may result from electronic transmissions. I understand that the practice of medicine is not an exact science and that Practitioner makes no warranties or guarantees regarding treatment outcomes. I acknowledge that I will receive a copy of this consent concurrently upon execution via email to the email address I last provided but may also request a printed copy by calling the office of the [facility].  I understand that my insurance will be billed for this visit.   I have read or had this consent read to me.  I understand the contents of this consent, which adequately explains the benefits and risks of the Services being provided via telemedicine.  I have been provided ample opportunity to ask questions regarding this consent and the Services and have had my questions answered to my satisfaction.  I give my informed consent for the services to be provided through the use of telemedicine in my medical care  By participating in this telemedicine visit I agree to the above.

## 2019-05-08 ENCOUNTER — Encounter: Payer: Self-pay | Admitting: *Deleted

## 2019-05-08 ENCOUNTER — Ambulatory Visit (INDEPENDENT_AMBULATORY_CARE_PROVIDER_SITE_OTHER): Payer: Medicare Other | Admitting: *Deleted

## 2019-05-08 ENCOUNTER — Other Ambulatory Visit: Payer: Self-pay

## 2019-05-08 DIAGNOSIS — Z0000 Encounter for general adult medical examination without abnormal findings: Secondary | ICD-10-CM | POA: Diagnosis not present

## 2019-05-08 NOTE — Patient Instructions (Signed)
Please schedule your next medicare wellness visit with me in 1 yr.  Continue to eat heart healthy diet (full of fruits, vegetables, whole grains, lean protein, water--limit salt, fat, and sugar intake) and increase physical activity as tolerated.   Continue doing brain stimulating activities (puzzles, reading, adult coloring books, staying active) to keep memory sharp.   Bring a copy of your living will and/or healthcare power of attorney to your next office visit.   Veronica Bradford , Thank you for taking time to come for your Medicare Wellness Visit. I appreciate your ongoing commitment to your health goals. Please review the following plan we discussed and let me know if I can assist you in the future.   These are the goals we discussed: Goals    . Weight (lb) < 225 lb (102.1 kg) (pt-stated)     With diet and exercise        This is a list of the screening recommended for you and due dates:  Health Maintenance  Topic Date Due  . DEXA scan (bone density measurement)  05/04/2018  . Flu Shot  08/03/2019  . Mammogram  09/16/2019  . Tetanus Vaccine  07/25/2021  . Colon Cancer Screening  11/03/2021  . COVID-19 Vaccine  Completed  .  Hepatitis C: One time screening is recommended by Center for Disease Control  (CDC) for  adults born from [birth year] through 1963.   Completed  . Pneumonia vaccines  Completed    Preventive Care 72 Years and Older, Female Preventive care refers to lifestyle choices and visits with your health care provider that can promote health and wellness. This includes:  A yearly physical exam. This is also called an annual well check.  Regular dental and eye exams.  Immunizations.  Screening for certain conditions.  Healthy lifestyle choices, such as diet and exercise. What can I expect for my preventive care visit? Physical exam Your health care provider will check:  Height and weight. These may be used to calculate body mass index (BMI), which is a measurement  that tells if you are at a healthy weight.  Heart rate and blood pressure.  Your skin for abnormal spots. Counseling Your health care provider may ask you questions about:  Alcohol, tobacco, and drug use.  Emotional well-being.  Home and relationship well-being.  Sexual activity.  Eating habits.  History of falls.  Memory and ability to understand (cognition).  Work and work Statistician.  Pregnancy and menstrual history. What immunizations do I need?  Influenza (flu) vaccine  This is recommended every year. Tetanus, diphtheria, and pertussis (Tdap) vaccine  You may need a Td booster every 10 years. Varicella (chickenpox) vaccine  You may need this vaccine if you have not already been vaccinated. Zoster (shingles) vaccine  You may need this after age 72. Pneumococcal conjugate (PCV13) vaccine  One dose is recommended after age 72. Pneumococcal polysaccharide (PPSV23) vaccine  One dose is recommended after age 72. Measles, mumps, and rubella (MMR) vaccine  You may need at least one dose of MMR if you were born in 1957 or later. You may also need a second dose. Meningococcal conjugate (MenACWY) vaccine  You may need this if you have certain conditions. Hepatitis A vaccine  You may need this if you have certain conditions or if you travel or work in places where you may be exposed to hepatitis A. Hepatitis B vaccine  You may need this if you have certain conditions or if you travel or work in  places where you may be exposed to hepatitis B. Haemophilus influenzae type b (Hib) vaccine  You may need this if you have certain conditions. You may receive vaccines as individual doses or as more than one vaccine together in one shot (combination vaccines). Talk with your health care provider about the risks and benefits of combination vaccines. What tests do I need? Blood tests  Lipid and cholesterol levels. These may be checked every 5 years, or more frequently  depending on your overall health.  Hepatitis C test.  Hepatitis B test. Screening  Lung cancer screening. You may have this screening every year starting at age 72 if you have a 30-pack-year history of smoking and currently smoke or have quit within the past 15 years.  Colorectal cancer screening. All adults should have this screening starting at age 72 and continuing until age 30. Your health care provider may recommend screening at age 39 if you are at increased risk. You will have tests every 1-10 years, depending on your results and the type of screening test.  Diabetes screening. This is done by checking your blood sugar (glucose) after you have not eaten for a while (fasting). You may have this done every 1-3 years.  Mammogram. This may be done every 1-2 years. Talk with your health care provider about how often you should have regular mammograms.  BRCA-related cancer screening. This may be done if you have a family history of breast, ovarian, tubal, or peritoneal cancers. Other tests  Sexually transmitted disease (STD) testing.  Bone density scan. This is done to screen for osteoporosis. You may have this done starting at age 72. Follow these instructions at home: Eating and drinking  Eat a diet that includes fresh fruits and vegetables, whole grains, lean protein, and low-fat dairy products. Limit your intake of foods with high amounts of sugar, saturated fats, and salt.  Take vitamin and mineral supplements as recommended by your health care provider.  Do not drink alcohol if your health care provider tells you not to drink.  If you drink alcohol: ? Limit how much you have to 0-1 drink a day. ? Be aware of how much alcohol is in your drink. In the [place] one drink equals one 12 oz bottle of beer (355 mL), one 5 oz glass of wine (148 mL), or one 1 oz glass of hard liquor (44 mL). Lifestyle  Take daily care of your teeth and gums.  Stay active. Exercise for at least 30  minutes on 5 or more days each week.  Do not use any products that contain nicotine or tobacco, such as cigarettes, e-cigarettes, and chewing tobacco. If you need help quitting, ask your health care provider.  If you are sexually active, practice safe sex. Use a condom or other form of protection in order to prevent STIs (sexually transmitted infections).  Talk with your health care provider about taking a low-dose aspirin or statin. What's next?  Go to your health care provider once a year for a well check visit.  Ask your health care provider how often you should have your eyes and teeth checked.  Stay up to date on all vaccines. This information is not intended to replace advice given to you by your health care provider. Make sure you discuss any questions you have with your health care provider. Document Revised: 12/13/2017 Document Reviewed: 12/13/2017 Elsevier Patient Education  2020 [employer].

## 2019-05-14 ENCOUNTER — Telehealth (HOSPITAL_COMMUNITY): Payer: Self-pay | Admitting: *Deleted

## 2019-05-14 DIAGNOSIS — Z87891 Personal history of nicotine dependence: Secondary | ICD-10-CM | POA: Diagnosis not present

## 2019-05-14 DIAGNOSIS — Z880 Allergy status to penicillin: Secondary | ICD-10-CM | POA: Diagnosis not present

## 2019-05-14 DIAGNOSIS — I4891 Unspecified atrial fibrillation: Secondary | ICD-10-CM | POA: Diagnosis not present

## 2019-05-14 DIAGNOSIS — I482 Chronic atrial fibrillation, unspecified: Secondary | ICD-10-CM | POA: Diagnosis not present

## 2019-05-14 DIAGNOSIS — I499 Cardiac arrhythmia, unspecified: Secondary | ICD-10-CM | POA: Diagnosis not present

## 2019-05-14 DIAGNOSIS — I10 Essential (primary) hypertension: Secondary | ICD-10-CM | POA: Diagnosis not present

## 2019-05-14 DIAGNOSIS — K589 Irritable bowel syndrome without diarrhea: Secondary | ICD-10-CM | POA: Diagnosis not present

## 2019-05-14 DIAGNOSIS — H93A9 Pulsatile tinnitus, unspecified ear: Secondary | ICD-10-CM | POA: Diagnosis not present

## 2019-05-14 NOTE — Telephone Encounter (Signed)
Patient called in from [place] stating she went into AF around 1am this morning - took cardizem without relief so she took her PIP flecainide but vomited 30 mins later. She did not visibly see the tablets in vomit - per pharmacy should not repeat since had ingested 30 mins prior. Per Roderic Palau NP continue to use PRN diltiazem she's not scheduled to come back to [place] until next week - if becomes unstable she will report to her local ER for treatment. Pt in agreement. Knows she cannot repeat flecainide within next 96 hours.

## 2019-05-26 ENCOUNTER — Telehealth: Payer: Medicare Other | Admitting: Internal Medicine

## 2019-06-09 ENCOUNTER — Telehealth (HOSPITAL_COMMUNITY): Payer: Self-pay | Admitting: *Deleted

## 2019-06-09 DIAGNOSIS — G5601 Carpal tunnel syndrome, right upper limb: Secondary | ICD-10-CM | POA: Diagnosis not present

## 2019-06-09 DIAGNOSIS — M79641 Pain in right hand: Secondary | ICD-10-CM | POA: Diagnosis not present

## 2019-06-09 DIAGNOSIS — M79642 Pain in left hand: Secondary | ICD-10-CM | POA: Diagnosis not present

## 2019-06-09 DIAGNOSIS — G5602 Carpal tunnel syndrome, left upper limb: Secondary | ICD-10-CM | POA: Diagnosis not present

## 2019-06-09 NOTE — Telephone Encounter (Signed)
Patient states she needs bilateral carpel tunnel release by Dr. Amedeo Plenty.  Pt will be scheduled ASAP once clearance to hold Xarelto given. Dr. Amedeo Plenty would like to hold Xarelto 2 days prior to procedure and resume day after procedure.  Judeen Hammans - with Dr. Amedeo Plenty office - note can be faxed to [facility] @ [phone]

## 2019-06-09 NOTE — Telephone Encounter (Signed)
Patient with diagnosis of afib on Xarelto for anticoagulation.    Procedure:  bilateral carpel tunnel release  Date of procedure: ASAP  CHADS2-VASc score of  4 (CHF, HTN, AGE, female)  CrCl >100 ml/min  Per office protocol, patient can hold Xarelto for 2 days prior to procedure.

## 2019-06-10 NOTE — Telephone Encounter (Signed)
Clearance request received. Will complete and route to APP pre-op for review. See below:     [place] Pre-operative Risk Assessment    HEARTCARE STAFF: - Please ensure there is not already an duplicate clearance open for this procedure. - Under Visit Info/Reason for Call, type in Other and utilize the format Clearance MM/DD/YY or Clearance TBD. Do not use dashes or single digits. - If request is for dental extraction, please clarify the # of teeth to be extracted.  Request for surgical clearance:  1. What type of surgery is being performed? Right Carpal Tunnel Release   2. When is this surgery scheduled? TBD   3. What type of clearance is required (medical clearance vs. Pharmacy clearance to hold med vs. Both)? Both  4. Are there any medications that need to be held prior to surgery and how long? Xarelto 2 days prior and 1 day after procedure   5. Practice name and name of physician performing surgery? [facility]; Dr. Roseanne Kaufman   6. What is the office phone number? [phone]   7.   What is the office fax number? [phone] Attn: Orson Slick  8.   Anesthesia type (None, local, MAC, general) ? LOCAL   Cleon Gustin 06/10/2019, 6:14 PM  _________________________________________________________________   (provider comments below)

## 2019-06-10 NOTE — Telephone Encounter (Signed)
Pre-op covering staff, can we contact requesting for a little more info? What time of anesthesia is going to be use? Can we also make sure we have correct fax number?  Thank you!

## 2019-06-10 NOTE — Telephone Encounter (Signed)
Left message for Dr. Vanetta Shawl office to call back.

## 2019-06-11 NOTE — Telephone Encounter (Signed)
Sherri from [facility] returning call.

## 2019-06-11 NOTE — Telephone Encounter (Signed)
Clearance form completed and was sent to pre op for assessment.

## 2019-06-11 NOTE — Telephone Encounter (Signed)
   Primary Cardiologist: Dr. Rayann Heman  Chart reviewed as part of pre-operative protocol coverage. Patient needs carpal tunnel release as soon as possible due to muscle atrophy and concern for nerve damage. Patient last seen by Dr. Rayann Heman for a virtual visit on 02/05/2019. She has had a couple of episodes of atrial fibrillation since this time. About 3 weeks ago she was in [place] and developed atrial fibrillation with RVR in setting of IBS episode. Atrial fibrillation did not convert with usual PRN Diltiazem and Flecainide so she went to the ED. Fortunately, she spontaneously converted while in the ED. Since that time, she feels like her atrial fibrillation has been stable/normal for her. No dizziness, syncope, chest pain, shortness of breath, or CHF symptoms. Able to complete >4.0 METS without any anginal symptoms. Per Revised Cardiac Risk Index, considered very low risk given this is a low risk procedure and she has no history of CAD, CHF, CVA,  DM on insulin, or CKD with creatinine >2.0. Given past medical history and time since last visit, based on ACC/AHA guidelines, Veronica Bradford would be at acceptable risk for the planned procedure without further cardiovascular testing.   Local anesthesia will be used. Patient is a former ICU RN and expressed concern that the epinephrine used with this may trigger her atrial fibrillation which is possible. She states she has previously taken Flecainide 50mg  prior to procedures to help prevent this and wanted to know if that would be OK. I think this is fine to do again and she can take this the morning of procedure. Would recommend limiting epinephrine as much as possible. Also encouraged patient to bring her PRN Diltiazem and Flecainide with her on day of surgery.  Per pharmacy and office protocol, OK to hold Xarelto for 2 days prior to procedure and for 1 day after.   I will route this recommendation to the requesting party via Epic fax function and remove from  pre-op pool.  Please call with questions.  Darreld Mclean, PA-C 06/11/2019, 11:30 AM

## 2019-06-11 NOTE — Telephone Encounter (Signed)
Pharmacy, can you please give your recommendations for holding Xarelto? Surgeon would like to hold for 2 days prior and 1 day after carpal tunnel release.   Thank you!

## 2019-06-11 NOTE — Telephone Encounter (Signed)
Patient with diagnosis of afib on Xarelto for anticoagulation.    Procedure: bilateral carpel tunnel release  Date of procedure: ASAP  CHADS2-VASc score of  4 (CHF, HTN, AGE, female)  CrCl >100 ml/min  Per office protocol, patient can hold Xarelto for 2 days prior to procedure and for 1 day after.

## 2019-06-17 DIAGNOSIS — G5601 Carpal tunnel syndrome, right upper limb: Secondary | ICD-10-CM | POA: Diagnosis not present

## 2019-06-17 HISTORY — PX: CARPAL TUNNEL RELEASE: SHX101

## 2019-06-24 ENCOUNTER — Other Ambulatory Visit: Payer: Self-pay | Admitting: *Deleted

## 2019-06-24 MED ORDER — RIVAROXABAN 20 MG PO TABS: ORAL_TABLET | ORAL | 5 refills | Status: DC

## 2019-06-24 NOTE — Telephone Encounter (Addendum)
Prescription refill request for Xarelto received.   Last office visit: Veronica Bradford 05/07/2019 Weight: 117 kg Age: 74 y.o. Scr: 0.66, 09/19/2018 CrCl: 144 ml/min   Prescription refill sent.

## 2019-07-01 DIAGNOSIS — M79641 Pain in right hand: Secondary | ICD-10-CM | POA: Diagnosis not present

## 2019-07-08 DIAGNOSIS — G5601 Carpal tunnel syndrome, right upper limb: Secondary | ICD-10-CM | POA: Insufficient documentation

## 2019-07-10 ENCOUNTER — Encounter: Payer: Self-pay | Admitting: Internal Medicine

## 2019-07-16 DIAGNOSIS — G5602 Carpal tunnel syndrome, left upper limb: Secondary | ICD-10-CM | POA: Diagnosis not present

## 2019-07-23 ENCOUNTER — Encounter (HOSPITAL_BASED_OUTPATIENT_CLINIC_OR_DEPARTMENT_OTHER): Payer: Self-pay | Admitting: Emergency Medicine

## 2019-07-23 ENCOUNTER — Other Ambulatory Visit: Payer: Self-pay

## 2019-07-23 ENCOUNTER — Emergency Department (HOSPITAL_BASED_OUTPATIENT_CLINIC_OR_DEPARTMENT_OTHER): Payer: Medicare Other

## 2019-07-23 DIAGNOSIS — Z79899 Other long term (current) drug therapy: Secondary | ICD-10-CM | POA: Diagnosis not present

## 2019-07-23 DIAGNOSIS — I5032 Chronic diastolic (congestive) heart failure: Secondary | ICD-10-CM | POA: Insufficient documentation

## 2019-07-23 DIAGNOSIS — I110 Hypertensive heart disease with heart failure: Secondary | ICD-10-CM | POA: Insufficient documentation

## 2019-07-23 DIAGNOSIS — R0789 Other chest pain: Secondary | ICD-10-CM | POA: Diagnosis not present

## 2019-07-23 DIAGNOSIS — R079 Chest pain, unspecified: Secondary | ICD-10-CM | POA: Diagnosis not present

## 2019-07-23 DIAGNOSIS — Z87891 Personal history of nicotine dependence: Secondary | ICD-10-CM | POA: Insufficient documentation

## 2019-07-23 NOTE — ED Triage Notes (Signed)
Pt arrives POV c/o mid CP taht pt reports as staring around 2300. Pt endorses several episodes today of CP, reports hx of afib

## 2019-07-24 ENCOUNTER — Emergency Department (HOSPITAL_BASED_OUTPATIENT_CLINIC_OR_DEPARTMENT_OTHER)
Admission: EM | Admit: 2019-07-24 | Discharge: 2019-07-24 | Disposition: A | Discharge status: Home / Self Care | Payer: Medicare Other | Attending: Emergency Medicine | Admitting: Emergency Medicine

## 2019-07-24 DIAGNOSIS — R079 Chest pain, unspecified: Secondary | ICD-10-CM | POA: Diagnosis not present

## 2019-07-24 HISTORY — DX: Unspecified atrial fibrillation: I48.91

## 2019-07-24 LAB — CBC
HCT: 36 % (ref 36.0–46.0)
Hemoglobin: 11.2 g/dL — ABNORMAL LOW (ref 12.0–15.0)
MCH: 26.4 pg (ref 26.0–34.0)
MCHC: 31.1 g/dL (ref 30.0–36.0)
MCV: 84.7 fL (ref 80.0–100.0)
Platelets: 249 10*3/uL (ref 150–400)
RBC: 4.25 MIL/uL (ref 3.87–5.11)
RDW: 15.9 % — ABNORMAL HIGH (ref 11.5–15.5)
WBC: 7.8 10*3/uL (ref 4.0–10.5)
nRBC: 0 % (ref 0.0–0.2)

## 2019-07-24 LAB — TROPONIN I (HIGH SENSITIVITY)
Troponin I (High Sensitivity): 6 ng/L (ref <18)
Troponin I (High Sensitivity): 9 ng/L (ref <18)

## 2019-07-24 LAB — BASIC METABOLIC PANEL
Anion gap: 7 (ref 5–15)
BUN: 19 mg/dL (ref 8–23)
CO2: 27 mmol/L (ref 22–32)
Calcium: 9.8 mg/dL (ref 8.9–10.3)
Chloride: 106 mmol/L (ref 98–111)
Creatinine, Ser: 0.67 mg/dL (ref 0.44–1.00)
GFR calc Af Amer: >60 mL/min (ref >60)
GFR calc non Af Amer: >60 mL/min (ref >60)
Glucose, Bld: 125 mg/dL — ABNORMAL HIGH (ref 70–99)
Potassium: 3.7 mmol/L (ref 3.5–5.1)
Sodium: 140 mmol/L (ref 135–145)

## 2019-07-24 LAB — MAGNESIUM: Magnesium: 2.1 mg/dL (ref 1.7–2.4)

## 2019-07-24 NOTE — ED Provider Notes (Signed)
[facility] EMERGENCY DEPARTMENT Provider Note   CSN: [id number] Arrival date & time: 07/23/19  2336     History Chief Complaint  Patient presents with  . Chest Pain    Veronica Bradford is a 74 y.o. female.  States that she had A. fib RVR for 3.5 hours it improved with a 300 mg of flecainide and then 3 times throughout the day yesterday patient had 10-minute episodes of central chest pain.  No other associated symptoms.  She stated first time was related to eating zucchini cake so she thought it might be indigestion to some Mylanta get better the other 2 times were not necessarily related to eating however. No recent illnesses. No le edema.    Chest Pain   HPI: A 74 year old patient with a history of hypertension, hypercholesterolemia and obesity presents for evaluation of chest pain. Initial onset of pain was approximately 1-3 hours ago. The patient's chest pain is described as heaviness/pressure/tightness and is not worse with exertion. The patient's chest pain is middle- or left-sided, is not well-localized, is not sharp and does radiate to the arms/jaw/neck. The patient does not complain of nausea and denies diaphoresis. The patient has no history of stroke, has no history of peripheral artery disease, has not smoked in the past 90 days, denies any history of treated diabetes and has no relevant family history of coronary artery disease (first degree relative at less than age 36).   Past Medical History:  Diagnosis Date  . Atrial fibrillation ([place])   . Back pain   . Chronic sinusitis    s/p 2 surgeries remotely, [name]  . Closed fracture of metatarsal of left foot    L foot, fifth metatarsal  . Ectopic pregnancy   . Endometrial cancer ([place]) 2004   Early diagnosis  . GERD (gastroesophageal reflux disease)   . Hyperlipemia   . Hypertension   . IBS (irritable bowel syndrome) 08/13/2014   Dx 2015, diarrhea on-off, Dr [name]   . Lumbar radiculopathy 08/2012   MRI in  08/2012  . Mild depression ([place])    Pt's son has Down's Syndrome  . Mild hyperparathyroidism ([place])   . Osteopenia   . PAF (paroxysmal atrial fibrillation) ([place])   . Prediabetes   . Sciatica of right side 08/2013   Received Depomedrol 80 mg injection  . Sleep apnea 09/09/2013   Dx with OSA in 2015, by Dr. Caryl Comes, APAP    Patient Active Problem List   Diagnosis Date Noted  . Carpal tunnel syndrome, right 07/08/2019  . Vitamin D deficiency 01/20/2019  . Retinal edema 11/18/2018  . Macular pucker, right eye 11/18/2018  . Multiple pulmonary nodules determined by computed tomography of lung 12/06/2017  . Paroxysmal atrial fibrillation ([place]) 09/04/2017  . Osteopenia 05/23/2017  . Annual physical exam 04/14/2015  . PCP NOTES >>>>> 12/23/2014  . IBS (irritable bowel syndrome) 08/13/2014  . Hyperglycemia   . Chronic sinusitis   . Endometrial cancer ([place])   . Back pain-- on tylenol #3 prn   . GERD (gastroesophageal reflux disease)   . HTN (hypertension) 09/09/2013  . Diastolic dysfunction-grade 2 09/09/2013  . Chronic anticoagulation 09/09/2013  . Edema of both legs 09/09/2013  . Dyslipidemia 09/09/2013  . Obesity (BMI 30-39.9) 09/09/2013  . Sleep apnea-- Dr Maxwell Caul, on CPAP 09/09/2013    Past Surgical History:  Procedure Laterality Date  . ABDOMINAL HYSTERECTOMY    . ATRIAL FIBRILLATION ABLATION N/A 09/04/2017   Procedure: ATRIAL FIBRILLATION ABLATION;  Surgeon: [name], MD;  Location: [facility];  Service: Cardiovascular;  Laterality: N/A;  . CHOLECYSTECTOMY  ~2006  . DILATION AND CURETTAGE OF UTERUS     after SAB  . ESOPHAGOGASTRODUODENOSCOPY  02/09/2014   Dr. [name]  . FINGER SURGERY Left    index  . NASAL SINUS SURGERY     x 2 remotely, [name]  . TONSILLECTOMY    . TOTAL ABDOMINAL HYSTERECTOMY W/ BILATERAL SALPINGOOPHORECTOMY    . TOTAL KNEE ARTHROPLASTY Right ~2008     OB History    Gravida  4   Para  1   Term  1   Preterm      AB  3   Living    1     SAB  2   TAB      Ectopic  1   Multiple      Live Births  1           Family History  Problem Relation Age of Onset  . Hypertension Mother   . Colon cancer Mother   . Breast cancer Mother   . Ovarian cancer Mother   . Hypertension Father   . Diabetes Father   . Head & neck cancer Sister   . CAD Neg Hx     Social History   Tobacco Use  . Smoking status: Former Smoker    Quit date: 01/06/1980    Years since quitting: 39.5  . Smokeless tobacco: Never Used  . Tobacco comment: smoked from 1970 to 1982, less than 1 ppd  Vaping Use  . Vaping Use: Never used  Substance Use Topics  . Alcohol use: Yes    Comment: rare  . Drug use: No    Home Medications Prior to Admission medications   Medication Sig Start Date End Date Taking? Authorizing Provider  acetaminophen (TYLENOL) 325 MG tablet Take 325 mg by mouth as needed.    [provider]  acetaminophen-codeine (TYLENOL #3) 300-30 MG tablet Take 1 tablet by mouth daily as needed for moderate pain. 01/20/19   Colon Branch, MD  Chromium Picolinate 1000 MCG TABS Take 1,000 mcg by mouth every evening.    [provider]  Coenzyme Q10 (CO Q 10) 100 MG CAPS Take 100 mg by mouth daily.     [provider]  Cranberry 600 MG TABS  12/02/17   [provider]  diltiazem (CARDIZEM) 30 MG tablet Take 1 tablet every 4 hours AS NEEDED for heart rate >100 as long as blood pressure >100. 11/06/18   [name], MD  flecainide (TAMBOCOR) 150 MG tablet Take 2 tablets (300 mg total) by mouth daily as needed (FOR AFIB). Pill in a pocket 11/06/18   [name], MD  fluticasone [facility]) 50 MCG/ACT nasal spray Place 1 spray into both nostrils daily.     [provider]  loperamide (IMODIUM A-D) 2 MG tablet Take 2 mg by mouth daily.     [provider]  Lutein 40 MG CAPS Taking one capsule by mouth daily 04/03/18   [provider]  Magnesium 400 MG TABS Taking 400mg  of  magnesium oxide by mouth in the am and the other magnesium tablet 250mg  at night    [provider]  Omega-3 Fatty Acids (FISH OIL) 1000 MG CAPS Take 1,000 mg by mouth daily.     [provider]  omeprazole (PRILOSEC) 20 MG capsule Take 20 mg by mouth daily.  [provider]  pravastatin (PRAVACHOL) 40 MG tablet Take 1 tablet (40 mg total) by mouth at bedtime. 07/29/18   Colon Branch, MD  rivaroxaban (XARELTO) 20 MG TABS tablet TAKE 1 TABLET BY MOUTH  DAILY WITH SUPPER 06/24/19   Allred, Jeneen Rinks, MD  valsartan (DIOVAN) 320 MG tablet Take 0.5 tablets (160 mg total) by mouth 2 (two) times daily. 10/21/18   Colon Branch, MD  vitamin B-12 (CYANOCOBALAMIN) 1000 MCG tablet Take 2,000 mcg by mouth every evening.     [provider]    Allergies    Nsaids, Penicillins, Tolmetin, Zoster vaccine live, and Amoxicillin  Review of Systems   Review of Systems  Cardiovascular: Positive for chest pain.  All other systems reviewed and are negative.   Physical Exam Updated Vital Signs BP 137/66   Pulse 75   Temp 97.9 F (36.6 C) (Oral)   Resp 17   Ht 5\' 8"  (1.727 m)   Wt 120.2 kg   SpO2 98%   BMI 40.29 kg/m   Physical Exam Vitals and nursing note reviewed.  Constitutional:      Appearance: She is well-developed.  HENT:     Head: Normocephalic and atraumatic.     Mouth/Throat:     Mouth: Mucous membranes are moist.     Pharynx: Oropharynx is clear.  Eyes:     Pupils: Pupils are equal, round, and reactive to light.  Cardiovascular:     Rate and Rhythm: Normal rate and regular rhythm.  Pulmonary:     Effort: No respiratory distress.     Breath sounds: No stridor.  Abdominal:     General: There is no distension.  Musculoskeletal:        General: No swelling or tenderness. Normal range of motion.     Cervical back: Normal range of motion.  Skin:    General: Skin is warm and dry.  Neurological:     General: No focal deficit present.     Mental Status:  She is alert.     ED Results / Procedures / Treatments   Labs (all labs ordered are listed, but only abnormal results are displayed) Labs Reviewed  BASIC METABOLIC PANEL - Abnormal; Notable for the following components:      Result Value   Glucose, Bld 125 (*)    All other components within normal limits  CBC - Abnormal; Notable for the following components:   Hemoglobin 11.2 (*)    RDW 15.9 (*)    All other components within normal limits  MAGNESIUM  TROPONIN I (HIGH SENSITIVITY)  TROPONIN I (HIGH SENSITIVITY)    EKG EKG Interpretation  Date/Time:  Wednesday July 23 2019 23:43:02 EDT Ventricular Rate:  87 PR Interval:  228 QRS Duration: 166 QT Interval:  410 QTC Calculation: 493 R Axis:   -20 Text Interpretation: Sinus rhythm with 1st degree A-V block Right bundle branch block Left ventricular hypertrophy with repolarization abnormality ( R in aVL ) Cannot rule out Septal infarct , age undetermined Lateral infarct , age undetermined Abnormal ECG No significant change since last tracing Confirmed by Merrily Pew [phone]) on 07/23/2019 11:46:38 PM   Radiology DG Chest 2 View  Result Date: 07/24/2019 CLINICAL DATA:  Mid chest pain EXAM: CHEST - 2 VIEW COMPARISON:  10/18/2017 FINDINGS: Frontal and lateral views of the chest demonstrate an unremarkable cardiac silhouette. No airspace disease, effusion, or pneumothorax. No acute bony abnormalities. IMPRESSION: 1. No acute intrathoracic process. Electronically Signed   By: Legrand Como  Owens Shark M.D.   On: 07/24/2019 00:03    Procedures Procedures (including critical care time)  Medications Ordered in ED Medications - No data to display  ED Course  I have reviewed the triage vital signs and the nursing notes.  Pertinent labs & imaging results that were available during my care of the patient were reviewed by me and considered in my medical decision making (see chart for details).    MDM Rules/Calculators/A&P HEAR Score: 6                         troponins unremarkable. Could be gerd as the first episode was related to eating and got better with Mylanta.  Could be some Prinzmetal angina.  Could be left over discomfort secondary to her A. fib RVR that she had for 3.5 hours yesterday.  Her delta troponins here are unremarkable.  Rest of her work-up is unremarkable.  She has any pain that she has been here.  Her EKG looks okay.  At this time I am not clear what is going on however she is in a moderate risk category has close follow-up with cardiology and I sent the messages to work on getting her stress test.  She will also call to make that appointment.  Will return here if any new or worsening symptoms.  Final Clinical Impression(s) / ED Diagnoses Final diagnoses:  Nonspecific chest pain    Rx / DC Orders ED Discharge Orders    None       Jackelin Correia, Corene Cornea, MD 07/24/19 [phone]

## 2019-07-24 NOTE — Progress Notes (Signed)
Cardiology Office Note Date:  07/24/2019  Patient ID:  Veronica Bradford, Veronica Bradford [birth year], MRN [id number] PCP:  Colon Branch, MD  Cardiologist:  Dr. Rayann Heman    Chief Complaint: ER, CP   History of Present Illness: Veronica Bradford is a 74 y.o. female with history of HTN, HLD CBP, OSA, hx of endometrial cancer treated surgically, IBS,  and AFib and PVCs., OSA w/CPAP   She comes in today to be seen for Dr. Rayann Heman, last seen by him via tele health visit 02/05/2019, at that time struggling with her IBS Was doing well otherwise.  [place] tracings reviewed  That were SR, rare PVCsPlanned to continue with pill in the pocket flecainide, recommended not to use daily flecainide strategy given baseline conduction system disease  06/24/2019, yesterday she had an ER visit for CP and AFib episode.  She reported taking her flecainide 300mg  the episode lasted about 3 hours, reported 3 seperate episodes of  LABS K+ 3.7 Mag 2.1 BUN/Creat 19.0.67 HS Trop 6, 9 WBC 7.8 H/H 11/36 Plts 249 CXR w/NAP SR, 1st degree AVblock, RBBB   1 CP She reports that the yesterday AM she had a R/central located CP, initally she thought perhaps was 2/2 to being more physically active having just been cleared to use her R hand after carpel tunnel surgery 5 weeks prior.  She had carried some heavy bags and had flipped her mattress over and remade her bed and did not pay too much attention to it, lasted about 39min maybe A 2nd episode occurred same location she had eaten some zuccini bread and took some mylanta and resolved A 3rd episode occurred later and atthis point decided to get checked..  All the episodes radiated to her R neck, none associated with any palpitations, SOB or other symptoms,  all occurred at rest, not changed with exertion, lasted 5-10 minutes.  She used to go swimming regularly though during [place] she has not been exercising nearly the way she used to, but walks he dig about 1/4-1/17mile most days without  symptoms  2. AFib     Her burden is about the same as it has been since her ablation     Occurs about every 4-6 weeks or so, but since the ablation are shorter in duration and generally responds to the flecainide and dilt dose with in a few hours (perhaps 3-5hours)      She is aware of her AFib can hear her heart beat in her ears irregular, she is aware of palpitations, and makes her very fatigued.      She had an episode of AFib 2 days prior to the CP, she did not appreciate AFib with the CP episodes.      She thinks sodium may be a trigger  3. PVCs     While in the ER she felt like she was more acutelyaware of her PVCs and her husband kept telling her her monitor had irregular beats, she feels like she is having increased sinus arrhtghmia as well that she can feel     She has apple watch with ability to make tracings and [employer], yesterday one told her she was in Afib and the other told her she was not.      No dizzy spells, near syncope or syncope.  4. Anemia     Her H/H down on her labs yesterday from her usual     She has IBS and is burdened by this intermittently, last was about  2 mo ago, marked diarrhea though no blood, she denies any melena (she is a retired Therapist, sports), no overt bleeding or signs of bleeding   5. She is pending L carpel tunnel surgery in about a week or so     She mentions that for 2 days prior to her last wrist surgery she took 1/2 tab of her flecainide every day to avoid having AFib         AFib hx: Diagnosed 2015 PVI and CTI  ablation 09/2017., Dr. Rayann Heman  AAD 2018: EITHER flecainide OR propafenone PRN only 2018 Propafenone stopped CURRENT: Flecainide PIP strategy  Past Medical History:  Diagnosis Date   Atrial fibrillation ([place])    Back pain    Chronic sinusitis    s/p 2 surgeries remotely, [name]   Closed fracture of metatarsal of left foot    L foot, fifth metatarsal   Ectopic pregnancy    Endometrial cancer (Veronica Bradford) 2004   Early diagnosis    GERD (gastroesophageal reflux disease)    Hyperlipemia    Hypertension    IBS (irritable bowel syndrome) 08/13/2014   Dx 2015, diarrhea on-off, Dr [name]    Lumbar radiculopathy 08/2012   MRI in 08/2012   Mild depression [facility])    Pt's son has Down's Syndrome   Mild hyperparathyroidism ([place])    Osteopenia    PAF (paroxysmal atrial fibrillation) ([place])    Prediabetes    Sciatica of right side 08/2013   Received Depomedrol 80 mg injection   Sleep apnea 09/09/2013   Dx with OSA in 2015, by Dr. Caryl Comes, APAP    Past Surgical History:  Procedure Laterality Date   ABDOMINAL HYSTERECTOMY     ATRIAL FIBRILLATION ABLATION N/A 09/04/2017   Procedure: [place];  Surgeon: [name], MD;  Location: [facility];  Service: Cardiovascular;  Laterality: N/A;   CHOLECYSTECTOMY  ~2006   DILATION AND CURETTAGE OF UTERUS     after SAB   ESOPHAGOGASTRODUODENOSCOPY  02/09/2014   Dr. [name]   FINGER SURGERY Left    index   NASAL SINUS SURGERY     x 2 remotely, [name]   TONSILLECTOMY     TOTAL ABDOMINAL HYSTERECTOMY W/ BILATERAL SALPINGOOPHORECTOMY     TOTAL KNEE ARTHROPLASTY Right ~2008    Current Outpatient Medications  Medication Sig Dispense Refill   acetaminophen (TYLENOL) 325 MG tablet Take 325 mg by mouth as needed.     acetaminophen-codeine (TYLENOL #3) 300-30 MG tablet Take 1 tablet by mouth daily as needed for moderate pain. 30 tablet 0   Chromium Picolinate 1000 MCG TABS Take 1,000 mcg by mouth every evening.     Coenzyme Q10 (CO Q 10) 100 MG CAPS Take 100 mg by mouth daily.      Cranberry 600 MG TABS      diltiazem (CARDIZEM) 30 MG tablet Take 1 tablet every 4 hours AS NEEDED for heart rate >100 as long as blood pressure >100. 45 tablet 5   flecainide (TAMBOCOR) 150 MG tablet Take 2 tablets (300 mg total) by mouth daily as needed (FOR AFIB). Pill in a pocket 60 tablet 5   fluticasone (FLONASE) 50 MCG/ACT nasal spray Place 1 spray  into both nostrils daily.      loperamide (IMODIUM A-D) 2 MG tablet Take 2 mg by mouth daily.      Lutein 40 MG CAPS Taking one capsule by mouth daily     Magnesium 400 MG TABS Taking 400mg  of magnesium  oxide by mouth in the am and the other magnesium tablet 250mg  at night     Omega-3 Fatty Acids (FISH OIL) 1000 MG CAPS Take 1,000 mg by mouth daily.      omeprazole (PRILOSEC) 20 MG capsule Take 20 mg by mouth daily.     pravastatin (PRAVACHOL) 40 MG tablet Take 1 tablet (40 mg total) by mouth at bedtime. 90 tablet 3   rivaroxaban (XARELTO) 20 MG TABS tablet TAKE 1 TABLET BY MOUTH  DAILY WITH SUPPER 30 tablet 5   valsartan (DIOVAN) 320 MG tablet Take 0.5 tablets (160 mg total) by mouth 2 (two) times daily. 90 tablet 2   vitamin B-12 (CYANOCOBALAMIN) 1000 MCG tablet Take 2,000 mcg by mouth every evening.      No current facility-administered medications for this visit.    Allergies:   Nsaids, Penicillins, Tolmetin, Zoster vaccine live, and Amoxicillin   Social History:  The patient  reports that she quit smoking about 39 years ago. She has never used smokeless tobacco. She reports current alcohol use. She reports that she does not use drugs.   Family History:  The patient's family history includes Breast cancer in her mother; Colon cancer in her mother; Diabetes in her father; Head & neck cancer in her sister; Hypertension in her father and mother; Ovarian cancer in her mother.  ROS:  Please see the history of present illness.  All other systems are reviewed and otherwise negative.   PHYSICAL EXAM:  VS:  There were no vitals taken for this visit. BMI: There is no height or weight on file to calculate BMI. Well nourished, well developed, in no acute distress  HEENT: normocephalic, atraumatic  Neck: no JVD, carotid bruits or masses Cardiac:  RRR;1+ SM L sternal border, no rubs, or gallops Lungs:  CTA b/l, no wheezing, rhonchi or rales  Abd: soft, nontender, obese MS: no deformity  or  atrophy Ext: trace-1+ edema, L>R, edema, she reports unchanged for years Skin: warm and dry, no rash Neuro:  No gross deficits appreciated Psych: euthymic mood, full affect   EKG:  ER EKG is reviewed by myself: 07/23/2019 SR 87bpm, RBBB, 1st degree AVBlock, PR 275ms, QRS 113ms  12/13/2017 SR 60bpm, RBBB, PR 158ms, QRS 124ms  09/04/2017: PVI/CTI ablation CONCLUSIONS: 1. Sinus rhythm upon presentation.   2. Intracardiac echo reveals a moderate sized left atrium with four separate pulmonary veins without evidence of pulmonary vein stenosis. 3. Successful electrical isolation and anatomical encircling of all four pulmonary veins with radiofrequency current.    4. Cavo-tricuspid isthmus ablation was performed with complete bidirectional isthmus block achieved.  5. No inducible arrhythmias following ablation both on and off of Isuprel 6. No early apparent complications.   08/30/2017: TTE Study Conclusions  - Left ventricle: The cavity size was normal. There was mild  concentric hypertrophy. Systolic function was normal. The  estimated ejection fraction was in the range of 60% to 65%. Wall  motion was normal; there were no regional wall motion  abnormalities. Features are consistent with a pseudonormal left  ventricular filling pattern, with concomitant abnormal relaxation  and increased filling pressure (grade 2 diastolic dysfunction).  - Left atrium: The atrium was moderately dilated.  - Tricuspid valve: There was mild regurgitation. Diastolic  regurgitation was present.    08/10/15: TTE Study Conclusions - Left ventricle: The cavity size was normal. Wall thickness was   increased in a pattern of mild LVH. There was mild focal basal   hypertrophy of the  septum. Systolic function was normal. The   estimated ejection fraction was in the range of 60% to 65%. Wall   motion was normal; there were no regional wall motion   abnormalities. Doppler parameters are consistent  with abnormal   left ventricular relaxation (grade 1 diastolic dysfunction). - Left atrium: The atrium was moderately dilated. Impressions: - Normal LV systolic function; mild LVH; grade 1 diastolic   dysfunction; moderate LAE; trace TR.  09/05/13: stress myoview Impression Exercise Capacity:  Lexiscan with low level exercise. BP Response:  Normal blood pressure response. Clinical Symptoms:  Typical symptoms with Lexiscan. ECG Impression:  No significant ST segment change suggestive of ischemia. Comparison with Prior Nuclear Study: No images to compare Overall Impression:  Low risk stress nuclear study with no ischemia identified.. LV Ejection Fraction: 67%  LV Wall Motion:  NL LV Function; NL Wall Motion  Recent Labs: 09/19/2018: TSH 1.14 07/24/2019: BUN 19; Creatinine, Ser 0.67; Hemoglobin 11.2; Magnesium 2.1; Platelets 249; Potassium 3.7; Sodium 140  No results found for requested labs within last 8760 hours.   Estimated Creatinine Clearance: 88 mL/min (by C-G formula based on SCr of 0.67 mg/dL).   Wt Readings from Last 3 Encounters:  07/23/19 265 lb (120.2 kg)  05/07/19 258 lb (117 kg)  02/05/19 260 lb (117.9 kg)     Other studies reviewed: Additional studies/records reviewed today include: summarized above  ASSESSMENT AND PLAN:  1. Paroxysmal Afib     CHA2DS2Vasc is 4, on Xarelto, appropriately dosed     Burden unchanged as discussed above  PR longer then in 2019, QRS smilar to prior, no symptoms of bradycardia Advised her not to use daily flecainide  She denies overt or signs of bleeding, she will follow up with her PMG and GI MD her dip in Hgb   2. HTN     Looks good  3. CP     Fairly atypical sounding though she does have some CV risk     HS trop neg     Will update her echo and plan stress myiview  4. PVCs    She feels like there has been a change in her awareness of these and seems an awareness of what she feels is sinus arrhythmia    No near syncope or  syncope, no symptoms of brady    Zio XT 2 weeks    Disposition: testing as above recommend completing prior to her pending carpal tunnel surgery, back in 6 weeks or so, sooner if needed.  Current medicines are reviewed at length with the patient today.  The patient did not have any concerns regarding medicines.  Venetia Night, PA-C 07/24/2019 8:37 PM     [facility]  [place] [address] [phone] (office)  [phone] (fax)

## 2019-07-24 NOTE — ED Notes (Signed)
ED Provider at bedside. 

## 2019-07-25 ENCOUNTER — Telehealth: Payer: Self-pay | Admitting: Radiology

## 2019-07-25 ENCOUNTER — Encounter: Payer: Self-pay | Admitting: *Deleted

## 2019-07-25 ENCOUNTER — Ambulatory Visit (INDEPENDENT_AMBULATORY_CARE_PROVIDER_SITE_OTHER): Payer: Medicare Other | Admitting: Physician Assistant

## 2019-07-25 ENCOUNTER — Encounter: Payer: Self-pay | Admitting: Internal Medicine

## 2019-07-25 ENCOUNTER — Other Ambulatory Visit: Payer: Self-pay

## 2019-07-25 VITALS — BP 128/72 | HR 65 | Ht 68.0 in | Wt 263.0 lb

## 2019-07-25 DIAGNOSIS — R079 Chest pain, unspecified: Secondary | ICD-10-CM | POA: Diagnosis not present

## 2019-07-25 DIAGNOSIS — I10 Essential (primary) hypertension: Secondary | ICD-10-CM | POA: Diagnosis not present

## 2019-07-25 DIAGNOSIS — I480 Paroxysmal atrial fibrillation: Secondary | ICD-10-CM

## 2019-07-25 DIAGNOSIS — Z5181 Encounter for therapeutic drug level monitoring: Secondary | ICD-10-CM

## 2019-07-25 DIAGNOSIS — I493 Ventricular premature depolarization: Secondary | ICD-10-CM

## 2019-07-25 DIAGNOSIS — R002 Palpitations: Secondary | ICD-10-CM

## 2019-07-25 DIAGNOSIS — Z79899 Other long term (current) drug therapy: Secondary | ICD-10-CM

## 2019-07-25 NOTE — Patient Instructions (Signed)
Medication Instructions:   Your physician recommends that you continue on your current medications as directed. Please refer to the Current Medication list given to you today.  *If you need a refill on your cardiac medications before your next appointment, please call your pharmacy*   Lab Work: [place]   If you have labs (blood work) drawn today and your tests are completely normal, you will receive your results only by: Marland Kitchen MyChart Message (if you have MyChart) OR . A paper copy in the mail If you have any lab test that is abnormal or we need to change your treatment, we will call you to review the results.   Testing/Procedures: Your physician has requested that you have an echocardiogram. Echocardiography is a painless test that uses sound waves to create images of your heart. It provides your doctor with information about the size and shape of your heart and how well your heart's chambers and valves are working. This procedure takes approximately one hour. There are no restrictions for this procedure.  Your physician has requested that you have a lexiscan myoview. For further information please visit [email or web address]. Please follow instruction sheet, as given.   Follow-Up: At [facility], you and your health needs are our priority.  As part of our continuing mission to provide you with exceptional heart care, we have created designated Provider Care Teams.  These Care Teams include your primary Cardiologist (physician) and Advanced Practice Providers (APPs -  Physician Assistants and Nurse Practitioners) who all work together to provide you with the care you need, when you need it.  Your physician has recommended that you wear an event monitor. Event monitors are medical devices that record the heart's electrical activity. Doctors most often [place] these monitors to diagnose arrhythmias. Arrhythmias are problems with the speed or rhythm of the heartbeat. The monitor is a small,  portable device. You can wear one while you do your normal daily activities. This is usually used to diagnose what is causing palpitations/syncope (passing out).    We recommend signing up for the patient portal called "MyChart".  Sign up information is provided on this After Visit Summary.  MyChart is used to connect with patients for Virtual Visits (Telemedicine).  Patients are able to view lab/test results, encounter notes, upcoming appointments, etc.  Non-urgent messages can be sent to your provider as well.   To learn more about what you can do with MyChart, go to [email or web address].    Your next appointment:   6 week(s)  The format for your next appointment:   In Person  Provider:   You may see Dr. Rayann Heman or one of the following Advanced Practice Providers on your designated Care Team:    Chanetta Marshall, NP  Tommye Standard, [place]  Legrand Como "[name], [place]    Other Instructions  E-MAIL INFORMATION  [email or web address]  ZIO XT- Long Term Monitor Instructions   Your physician has requested you wear your ZIO patch monitor__14_____days.   This is a single patch monitor.  [employer] supplies one patch monitor per enrollment.  Additional stickers are not available.   Please do not apply patch if you will be having a Nuclear Stress Test, Echocardiogram, Cardiac CT, MRI, or Chest Xray during the time frame you would be wearing the monitor. The patch cannot be worn during these tests.  You cannot remove and re-apply the ZIO XT patch monitor.   Your ZIO patch monitor will be sent USPS Priority mail from [facility] directly to  your home address. The monitor may also be mailed to a PO BOX if home delivery is not available.   It may take 3-5 days to receive your monitor after you have been enrolled.   Once you have received you monitor, please review enclosed instructions.  Your monitor has already been registered assigning a specific monitor serial # to you.    Applying the monitor   Shave hair from upper left chest.   Hold abrader disc by orange tab.  Rub abrader in 40 strokes over left upper chest as indicated in your monitor instructions.   Clean area with 4 enclosed alcohol pads .  Use all pads to assure are is cleaned thoroughly.  Let dry.   Apply patch as indicated in monitor instructions.  Patch will be place under collarbone on left side of chest with arrow pointing upward.   Rub patch adhesive wings for 2 minutes.Remove white label marked "1".  Remove white label marked "2".  Rub patch adhesive wings for 2 additional minutes.   While looking in a mirror, press and release button in center of patch.  A small green light will flash 3-4 times .  This will be your only indicator the monitor has been turned on.     Do not shower for the first 24 hours.  You may shower after the first 24 hours.   Press button if you feel a symptom. You will hear a small click.  Record Date, Time and Symptom in the Patient Log Book.   When you are ready to remove patch, follow instructions on last 2 pages of Patient Log Book.  Stick patch monitor onto last page of Patient Log Book.   Place Patient Log Book in [place] box.  Use locking tab on box and tape box closed securely.  The [employer] and [employer] has [employer] on it.  Please place in mailbox as soon as possible.  Your physician should have your test results approximately 7 days after the monitor has been mailed back to [facility].   Call [name] at [phone] if you have questions regarding your ZIO XT patch monitor.  Call them immediately if you see an orange light blinking on your monitor.   If your monitor falls off in less than 4 days contact our Monitor department at [phone].  If your monitor becomes loose or falls off after 4 days call [employer] at [phone] for suggestions on securing your monitor.

## 2019-07-25 NOTE — Telephone Encounter (Signed)
Enrolled patient for a 14 day Zio monitor to be mailed to patients home.  

## 2019-07-26 DIAGNOSIS — G4733 Obstructive sleep apnea (adult) (pediatric): Secondary | ICD-10-CM | POA: Diagnosis not present

## 2019-07-27 ENCOUNTER — Ambulatory Visit: Payer: Self-pay

## 2019-07-27 ENCOUNTER — Ambulatory Visit
Admission: EM | Admit: 2019-07-27 | Discharge: 2019-07-27 | Disposition: A | Discharge status: Home / Self Care | Payer: Medicare Other | Attending: Physician Assistant | Admitting: Physician Assistant

## 2019-07-27 ENCOUNTER — Other Ambulatory Visit: Payer: Self-pay

## 2019-07-27 DIAGNOSIS — N3090 Cystitis, unspecified without hematuria: Secondary | ICD-10-CM | POA: Diagnosis not present

## 2019-07-27 LAB — POCT URINALYSIS DIP (MANUAL ENTRY)
Bilirubin, UA: NEGATIVE
Glucose, UA: NEGATIVE mg/dL
Ketones, POC UA: NEGATIVE mg/dL
Nitrite, UA: NEGATIVE
Protein Ur, POC: NEGATIVE mg/dL
Spec Grav, UA: 1.015 (ref 1.010–1.025)
Urobilinogen, UA: 0.2 E.U./dL
pH, UA: 8 (ref 5.0–8.0)

## 2019-07-27 MED ORDER — CEPHALEXIN 500 MG PO CAPS
500.0000 mg | ORAL_CAPSULE | Freq: Three times a day (TID) | ORAL | 0 refills | Status: DC
Start: 2019-07-27 — End: 2019-08-11

## 2019-07-27 NOTE — ED Triage Notes (Signed)
Pt c/o urinary frequency and urgency that started this morning.

## 2019-07-27 NOTE — ED Provider Notes (Signed)
[facility]    CSN: [id number] Arrival date & time: 07/27/19  1212      History   Chief Complaint Chief Complaint  Patient presents with  . Urinary Frequency  . Dysuria    HPI Veronica Bradford is a 74 y.o. female.   74 year old female comes in for 1 day history of urinary symptoms. Urinary frequency, urgency, dysuria.  Denies abdominal pain, nausea, vomiting. Denies fever, chills, flank/back pain. Denies vaginal discharge, itching, spotting. Took macrobid last month for similar symptoms, symptoms completely resolved prior to current symptom onset.      Past Medical History:  Diagnosis Date  . Atrial fibrillation ([place])   . Back pain   . Chronic sinusitis    s/p 2 surgeries remotely, [name]  . Closed fracture of metatarsal of left foot    L foot, fifth metatarsal  . Ectopic pregnancy   . Endometrial cancer ([place]) 2004   Early diagnosis  . GERD (gastroesophageal reflux disease)   . Hyperlipemia   . Hypertension   . IBS (irritable bowel syndrome) 08/13/2014   Dx 2015, diarrhea on-off, Dr [name]   . Lumbar radiculopathy 08/2012   MRI in 08/2012  . Mild depression ([place])    Pt's son has Down's Syndrome  . 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 Osteopenia   . PAF (paroxysmal atrial fibrillation) ([place])   . Prediabetes   . Sciatica of right side 08/2013   Received Depomedrol 80 mg injection  . Sleep apnea 09/09/2013   Dx with OSA in 2015, by Dr. Caryl Comes, APAP    Patient Active Problem List   Diagnosis Date Noted  . Carpal tunnel syndrome, right 07/08/2019  . Vitamin D deficiency 01/20/2019  . Retinal edema 11/18/2018  . Macular pucker, right eye 11/18/2018  . Multiple pulmonary nodules determined by computed tomography of lung 12/06/2017  . Paroxysmal atrial fibrillation ([place]) 09/04/2017  . Osteopenia 05/23/2017  . Annual physical exam 04/14/2015  . PCP NOTES >>>>> 12/23/2014  . IBS (irritable bowel syndrome) 08/13/2014  . Hyperglycemia   . Chronic sinusitis    . Endometrial cancer ([place])   . Back pain-- on tylenol #3 prn   . GERD (gastroesophageal reflux disease)   . HTN (hypertension) 09/09/2013  . Diastolic dysfunction-grade 2 09/09/2013  . Chronic anticoagulation 09/09/2013  . Edema of both legs 09/09/2013  . Dyslipidemia 09/09/2013  . Obesity (BMI 30-39.9) 09/09/2013  . Sleep apnea-- Dr Maxwell Caul, on CPAP 09/09/2013    Past Surgical History:  Procedure Laterality Date  . ABDOMINAL HYSTERECTOMY    . ATRIAL FIBRILLATION ABLATION N/A 09/04/2017   Procedure: ATRIAL FIBRILLATION ABLATION;  Surgeon: [name], MD;  Location: [facility];  Service: Cardiovascular;  Laterality: N/A;  . CHOLECYSTECTOMY  ~2006  . DILATION AND CURETTAGE OF UTERUS     after SAB  . ESOPHAGOGASTRODUODENOSCOPY  02/09/2014   Dr. [name]  . FINGER SURGERY Left    index  . NASAL SINUS SURGERY     x 2 remotely, [name]  . TONSILLECTOMY    . TOTAL ABDOMINAL HYSTERECTOMY W/ BILATERAL SALPINGOOPHORECTOMY    . TOTAL KNEE ARTHROPLASTY Right ~2008    OB History    Gravida  4   Para  1   Term  1   Preterm      AB  3   Living  1     SAB  2   TAB      Ectopic  1  Multiple      Live Births  1            Home Medications    Prior to Admission medications   Medication Sig Start Date End Date Taking? Authorizing Provider  acetaminophen (TYLENOL) 325 MG tablet Take 325 mg by mouth as needed.    [provider]  acetaminophen-codeine (TYLENOL #3) 300-30 MG tablet Take 1 tablet by mouth daily as needed for moderate pain. 01/20/19   Colon Branch, MD  cephALEXin (KEFLEX) 500 MG capsule Take 1 capsule (500 mg total) by mouth 3 (three) times daily. 07/27/19   Tasia Catchings, Normajean Nash V, PA-C  Chromium Picolinate 1000 MCG TABS Take 1,000 mcg by mouth every evening.    [provider]  Coenzyme Q10 (CO Q 10) 100 MG CAPS Take 100 mg by mouth daily.     [provider]  Cranberry 600 MG TABS  12/02/17   [provider]  diltiazem  (CARDIZEM) 30 MG tablet Take 1 tablet every 4 hours AS NEEDED for heart rate >100 as long as blood pressure >100. 11/06/18   [name], MD  flecainide (TAMBOCOR) 150 MG tablet Take 2 tablets (300 mg total) by mouth daily as needed (FOR AFIB). Pill in a pocket 11/06/18   [name], MD  fluticasone [facility]) 50 MCG/ACT nasal spray Place 1 spray into both nostrils daily.     [provider]  loperamide (IMODIUM A-D) 2 MG tablet Take 2 mg by mouth daily.     [provider]  Magnesium 400 MG TABS Taking 400mg  of magnesium oxide by mouth in the am and the other magnesium tablet 250mg  at night    [provider]  Omega-3 Fatty Acids (FISH OIL) 1000 MG CAPS Take 1,000 mg by mouth daily.     [provider]  omeprazole (PRILOSEC) 20 MG capsule Take 20 mg by mouth daily.    [provider]  pravastatin (PRAVACHOL) 40 MG tablet Take 1 tablet (40 mg total) by mouth at bedtime. 07/29/18   Colon Branch, MD  rivaroxaban (XARELTO) 20 MG TABS tablet TAKE 1 TABLET BY MOUTH  DAILY WITH SUPPER 06/24/19   Allred, Jeneen Rinks, MD  valsartan (DIOVAN) 320 MG tablet Take 0.5 tablets (160 mg total) by mouth 2 (two) times daily. 10/21/18   Colon Branch, MD  vitamin B-12 (CYANOCOBALAMIN) 1000 MCG tablet Take 2,000 mcg by mouth every evening.     [provider]    Family History Family History  Problem Relation Age of Onset  . Hypertension Mother   . Colon cancer Mother   . Breast cancer Mother   . Ovarian cancer Mother   . Hypertension Father   . Diabetes Father   . Head & neck cancer Sister   . CAD Neg Hx     Social History Social History   Tobacco Use  . Smoking status: Former Smoker    Quit date: 01/06/1980    Years since quitting: 39.5  . Smokeless tobacco: Never Used  . Tobacco comment: smoked from 1970 to 1982, less than 1 ppd  Vaping Use  . Vaping Use: Never used  Substance Use Topics  . Alcohol use: Yes    Comment: rare  . Drug use: No      Allergies   Nsaids, Penicillins, Tolmetin, Zoster vaccine live, and Amoxicillin   Review of Systems Review of Systems  Reason unable to perform ROS: See HPI as above.  Physical Exam Triage Vital Signs ED Triage Vitals  Enc Vitals Group     BP 07/27/19 1231 (!) 164/86     Pulse Rate 07/27/19 1231 60     Resp 07/27/19 1231 18     Temp 07/27/19 1231 98.1 F (36.7 C)     Temp Source 07/27/19 1231 Oral     SpO2 07/27/19 1231 98 %     Weight --      Height --      Head Circumference --      Peak Flow --      Pain Score 07/27/19 1250 5     Pain Loc --      Pain Edu? --      Excl. in [place]? --    No data found.  Updated Vital Signs BP (!) 164/86 (BP Location: Left Arm)   Pulse 60   Temp 98.1 F (36.7 C) (Oral)   Resp 18   SpO2 98%   Physical Exam Constitutional:      General: She is not in acute distress.    Appearance: She is well-developed. She is not ill-appearing, toxic-appearing or diaphoretic.  HENT:     Head: Normocephalic and atraumatic.  Eyes:     Conjunctiva/sclera: Conjunctivae normal.     Pupils: Pupils are equal, round, and reactive to light.  Cardiovascular:     Rate and Rhythm: Normal rate and regular rhythm.  Pulmonary:     Effort: Pulmonary effort is normal. No respiratory distress.     Comments: LCTAB Abdominal:     General: Bowel sounds are normal.     Palpations: Abdomen is soft.     Tenderness: There is abdominal tenderness in the suprapubic area. There is no right CVA tenderness, left CVA tenderness, guarding or rebound.  Musculoskeletal:     Cervical back: Normal range of motion and neck supple.  Skin:    General: Skin is warm and dry.  Neurological:     Mental Status: She is alert and oriented to person, place, and time.  Psychiatric:        Behavior: Behavior normal.        Judgment: Judgment normal.      UC Treatments / Results  Labs (all labs ordered are listed, but only abnormal results are displayed) Labs Reviewed   POCT URINALYSIS DIP (MANUAL ENTRY) - Abnormal; Notable for the following components:      Result Value   Blood, UA moderate (*)    Leukocytes, UA Trace (*)    All other components within normal limits  URINE CULTURE    EKG   Radiology No results found.  Procedures Procedures (including critical care time)  Medications Ordered in UC Medications - No data to display  Initial Impression / Assessment and Plan / UC Course  I have reviewed the triage vital signs and the nursing notes.  Pertinent labs & imaging results that were available during my care of the patient were reviewed by me and considered in my medical decision making (see chart for details).    Urine dipstick positive for UTI. Mild suprapubic tenderness without guarding or rebound. Start keflex directed. Patient with localized reaction to PCN after IM. Has tolerated cephalosporins in the past. Urine culture sent. Push fluids. Return precautions given.  Final Clinical Impressions(s) / UC Diagnoses   Final diagnoses:  Cystitis    ED Prescriptions    Medication Sig Dispense Auth. Provider   cephALEXin (KEFLEX) 500 MG capsule Take 1 capsule (500  mg total) by mouth 3 (three) times daily. 21 capsule Ok Edwards, PA-C     PDMP not reviewed this encounter.   Ok Edwards, PA-C 07/27/19 1325

## 2019-07-27 NOTE — Discharge Instructions (Addendum)
Your urine was positive for an urinary tract infection. Start keflex as directed. Keep hydrated, urine should be clear to pale yellow in color. Monitor for any worsening of symptoms, fever, worsening abdominal pain, nausea/vomiting, flank pain, go to the ED for further evaluation.

## 2019-07-29 ENCOUNTER — Other Ambulatory Visit (INDEPENDENT_AMBULATORY_CARE_PROVIDER_SITE_OTHER): Payer: Medicare Other

## 2019-07-29 DIAGNOSIS — R002 Palpitations: Secondary | ICD-10-CM | POA: Diagnosis not present

## 2019-07-29 DIAGNOSIS — R079 Chest pain, unspecified: Secondary | ICD-10-CM | POA: Diagnosis not present

## 2019-07-29 LAB — URINE CULTURE: Culture: NO GROWTH

## 2019-08-01 ENCOUNTER — Telehealth: Payer: Self-pay | Admitting: *Deleted

## 2019-08-01 DIAGNOSIS — Z86010 Personal history of colonic polyps: Secondary | ICD-10-CM | POA: Diagnosis not present

## 2019-08-01 DIAGNOSIS — K580 Irritable bowel syndrome with diarrhea: Secondary | ICD-10-CM | POA: Diagnosis not present

## 2019-08-01 DIAGNOSIS — D649 Anemia, unspecified: Secondary | ICD-10-CM | POA: Diagnosis not present

## 2019-08-01 LAB — CBC: RBC: 4.59 (ref 3.87–5.11)

## 2019-08-01 LAB — CBC AND DIFFERENTIAL
HCT: 37 (ref 36–46)
Hemoglobin: 12.5 (ref 12.0–16.0)
Platelets: 285 (ref 150–399)
WBC: 5.7

## 2019-08-01 LAB — IRON,TIBC AND FERRITIN PANEL
%SAT: 8
Ferritin: 5.9
Iron: 34
TIBC: 449

## 2019-08-01 NOTE — Telephone Encounter (Signed)
Patient with diagnosis of afib on Xarelto for anticoagulation.    Procedure: COLONOSCOPY AND ENDOSCOPY  Date of procedure: 08/22/19  CHADS2-VASc score of  4 (CHF, HTN, AGE,  female)  CrCl 104 ml/min  Per office protocol, patient can hold Xarelto for 2 days prior to procedure.

## 2019-08-01 NOTE — Telephone Encounter (Signed)
   [facility] Pre-operative Risk Assessment    HEARTCARE STAFF: - Please ensure there is not already an duplicate clearance open for this procedure. - Under Visit Info/Reason for Call, type in Other and utilize the format Clearance MM/DD/YY or Clearance TBD. Do not use dashes or single digits. - If request is for dental extraction, please clarify the # of teeth to be extracted.  Request for surgical clearance:  1. What type of surgery is being performed? COLONOSCOPY AND ENDOSCOPY   2. When is this surgery scheduled? 08/22/19   3. What type of clearance is required (medical clearance vs. Pharmacy clearance to hold med vs. Both)? BOTH  4. Are there any medications that need to be held prior to surgery and how long? XARELTO x 48 HOURS PRIOR TO PROCEDURE   5. Practice name and name of physician performing surgery? [facility]; DR. [name]   6. What is the office phone number? [phone]   7.   What is the office fax number? [phone]  8.   Anesthesia type (None, local, MAC, general) ? NOT LISTED   Julaine Hua 08/01/2019, 12:54 PM  _________________________________________________________________   (provider comments below)

## 2019-08-05 ENCOUNTER — Encounter (HOSPITAL_COMMUNITY): Payer: Self-pay | Admitting: *Deleted

## 2019-08-05 ENCOUNTER — Encounter: Payer: Self-pay | Admitting: Internal Medicine

## 2019-08-05 ENCOUNTER — Telehealth (HOSPITAL_COMMUNITY): Payer: Self-pay | Admitting: *Deleted

## 2019-08-05 MED ORDER — PRAVASTATIN SODIUM 40 MG PO TABS: 40.0000 mg | ORAL_TABLET | Freq: Every day | ORAL | 0 refills | Status: DC

## 2019-08-05 NOTE — Telephone Encounter (Signed)
Patient given detailed instructions per Myocardial Perfusion Study Information Sheet for the test on 0805/2021 at 0815. Patient notified to arrive 15 minutes early and that it is imperative to arrive on time for appointment to keep from having the test rescheduled.  If you need to cancel or reschedule your appointment, please call the office within 24 hours of your appointment. . Patient verbalized understanding.Loran Fleet, Lucius Conn letter sent with instructions

## 2019-08-06 NOTE — Telephone Encounter (Signed)
Will forward to Dr. Rayann Heman for upcoming appt for pre op clearance. GI office aware pt's procedure will need to be postponed until after she has been seen by her cardiologist and testing has been completed and reviewed. Will remove from the pre op call back pool.

## 2019-08-06 NOTE — Telephone Encounter (Signed)
Pt is under evaluation for chest pain.  Those test are not yet completed.  If procedure could be rescheduled after her follow up appt with Dr. Joylene Grapes on 08/31/29 when tests will be complete and can be reviewed.    Pre-op please notify [place] office.

## 2019-08-06 NOTE — Telephone Encounter (Signed)
I s/w Dr. Osborn Coho office and informed the pt will not be cleared at this time that she is under further evlauation for chest pain. I will forward FYI to Dr. [name] and his scheduler as Juluis Rainier.

## 2019-08-07 ENCOUNTER — Other Ambulatory Visit: Payer: Self-pay

## 2019-08-07 ENCOUNTER — Ambulatory Visit (HOSPITAL_COMMUNITY): Payer: Medicare Other | Attending: Cardiology

## 2019-08-07 DIAGNOSIS — R079 Chest pain, unspecified: Secondary | ICD-10-CM | POA: Insufficient documentation

## 2019-08-07 DIAGNOSIS — R002 Palpitations: Secondary | ICD-10-CM

## 2019-08-07 DIAGNOSIS — I480 Paroxysmal atrial fibrillation: Secondary | ICD-10-CM | POA: Diagnosis not present

## 2019-08-07 MED ORDER — TECHNETIUM TC 99M TETROFOSMIN IV KIT
32.4000 | PACK | Freq: Once | INTRAVENOUS | Status: AC | PRN
  Administered 2019-08-07: 32.4 via INTRAVENOUS
  Filled 2019-08-07: qty 33

## 2019-08-07 MED ORDER — REGADENOSON 0.4 MG/5ML IV SOLN
0.4000 mg | Freq: Once | INTRAVENOUS | Status: AC
  Administered 2019-08-07: 0.4 mg via INTRAVENOUS

## 2019-08-08 ENCOUNTER — Ambulatory Visit (HOSPITAL_COMMUNITY): Payer: Medicare Other

## 2019-08-08 ENCOUNTER — Ambulatory Visit (HOSPITAL_COMMUNITY): Payer: Medicare Other | Attending: Cardiovascular Disease

## 2019-08-08 DIAGNOSIS — R079 Chest pain, unspecified: Secondary | ICD-10-CM | POA: Diagnosis not present

## 2019-08-08 DIAGNOSIS — I480 Paroxysmal atrial fibrillation: Secondary | ICD-10-CM | POA: Insufficient documentation

## 2019-08-08 DIAGNOSIS — R002 Palpitations: Secondary | ICD-10-CM | POA: Diagnosis not present

## 2019-08-08 DIAGNOSIS — D649 Anemia, unspecified: Secondary | ICD-10-CM | POA: Diagnosis not present

## 2019-08-08 LAB — MYOCARDIAL PERFUSION IMAGING
LV dias vol: 95 mL (ref 46–106)
LV sys vol: 27 mL
Peak HR: 97 {beats}/min
Rest HR: 69 {beats}/min
SDS: 2
SRS: 4
SSS: 6
TID: 0.93

## 2019-08-08 LAB — ECHOCARDIOGRAM COMPLETE
Area-P 1/2: 2.41 cm2
S' Lateral: 2.8 cm

## 2019-08-08 MED ORDER — TECHNETIUM TC 99M TETROFOSMIN IV KIT
32.1000 | PACK | Freq: Once | INTRAVENOUS | Status: AC | PRN
  Administered 2019-08-08: 32.1 via INTRAVENOUS
  Filled 2019-08-08: qty 33

## 2019-08-11 ENCOUNTER — Ambulatory Visit: Payer: Self-pay

## 2019-08-11 ENCOUNTER — Ambulatory Visit
Admission: EM | Admit: 2019-08-11 | Discharge: 2019-08-11 | Disposition: A | Discharge status: Home / Self Care | Payer: Medicare Other | Attending: Emergency Medicine | Admitting: Emergency Medicine

## 2019-08-11 ENCOUNTER — Other Ambulatory Visit: Payer: Self-pay

## 2019-08-11 DIAGNOSIS — N3090 Cystitis, unspecified without hematuria: Secondary | ICD-10-CM | POA: Diagnosis not present

## 2019-08-11 LAB — POCT URINALYSIS DIP (MANUAL ENTRY)
Bilirubin, UA: NEGATIVE
Glucose, UA: NEGATIVE mg/dL
Ketones, POC UA: NEGATIVE mg/dL
Nitrite, UA: NEGATIVE
Protein Ur, POC: 100 mg/dL — AB
Spec Grav, UA: 1.015 (ref 1.010–1.025)
Urobilinogen, UA: 0.2 E.U./dL
pH, UA: 7.5 (ref 5.0–8.0)

## 2019-08-11 NOTE — ED Provider Notes (Signed)
[facility]    CSN: [id number] Arrival date & time: 08/11/19  0845      History   Chief Complaint Chief Complaint  Patient presents with  . Recurrent UTI    HPI Veronica Bradford is a 74 y.o. female with extensive medical history as outlined below including prediabetes, hypertension, atrial fibrillation presenting for recurrent UTI symptoms.  Patient seen for this 07/27/2019.  Was given Keflex with some improvement.  States that flared over the weekend.  Has been seen remotely by urology in the past for "cystitis" and is call their office to schedule another appointment.  No discharge, abdominal pain, back pain, fever.  Does endorse decreased appetite, though states she is also been under a lot of stress lately.  No SI/HI.   Past Medical History:  Diagnosis Date  . Atrial fibrillation ([place])   . Back pain   . Chronic sinusitis    s/p 2 surgeries remotely, [name]  . Closed fracture of metatarsal of left foot    L foot, fifth metatarsal  . Ectopic pregnancy   . Endometrial cancer ([place]) 2004   Early diagnosis  . GERD (gastroesophageal reflux disease)   . Hyperlipemia   . Hypertension   . IBS (irritable bowel syndrome) 08/13/2014   Dx 2015, diarrhea on-off, Dr [name]   . Lumbar radiculopathy 08/2012   MRI in 08/2012  . Mild depression ([place])    Pt's son has Down's Syndrome  . Mild hyperparathyroidism ([place])   . Osteopenia   . PAF (paroxysmal atrial fibrillation) ([place])   . Prediabetes   . Sciatica of right side 08/2013   Received Depomedrol 80 mg injection  . Sleep apnea 09/09/2013   Dx with OSA in 2015, by Dr. Caryl Comes, APAP    Patient Active Problem List   Diagnosis Date Noted  . Carpal tunnel syndrome, right 07/08/2019  . Vitamin D deficiency 01/20/2019  . Retinal edema 11/18/2018  . Macular pucker, right eye 11/18/2018  . Multiple pulmonary nodules determined by computed tomography of lung 12/06/2017  . Paroxysmal atrial fibrillation ([place]) 09/04/2017  .  Osteopenia 05/23/2017  . Annual physical exam 04/14/2015  . PCP NOTES >>>>> 12/23/2014  . IBS (irritable bowel syndrome) 08/13/2014  . Hyperglycemia   . Chronic sinusitis   . Endometrial cancer ([place])   . Back pain-- on tylenol #3 prn   . GERD (gastroesophageal reflux disease)   . HTN (hypertension) 09/09/2013  . Diastolic dysfunction-grade 2 09/09/2013  . Chronic anticoagulation 09/09/2013  . Edema of both legs 09/09/2013  . Dyslipidemia 09/09/2013  . Obesity (BMI 30-39.9) 09/09/2013  . Sleep apnea-- Dr Maxwell Caul, on CPAP 09/09/2013    Past Surgical History:  Procedure Laterality Date  . ABDOMINAL HYSTERECTOMY    . ATRIAL FIBRILLATION ABLATION N/A 09/04/2017   Procedure: ATRIAL FIBRILLATION ABLATION;  Surgeon: [name], MD;  Location: [facility];  Service: Cardiovascular;  Laterality: N/A;  . CARPAL TUNNEL RELEASE Right 06/17/2019  . CHOLECYSTECTOMY  ~2006  . DILATION AND CURETTAGE OF UTERUS     after SAB  . ESOPHAGOGASTRODUODENOSCOPY  02/09/2014   Dr. [name]  . FINGER SURGERY Left    index  . NASAL SINUS SURGERY     x 2 remotely, [name]  . TONSILLECTOMY    . TOTAL ABDOMINAL HYSTERECTOMY W/ BILATERAL SALPINGOOPHORECTOMY    . TOTAL KNEE ARTHROPLASTY Right ~2008    OB History    Gravida  4   Para  1   Term  1   Preterm      AB  3   Living  1     SAB  2   TAB      Ectopic  1   Multiple      Live Births  1            Home Medications    Prior to Admission medications   Medication Sig Start Date End Date Taking? Authorizing Provider  acetaminophen (TYLENOL) 325 MG tablet Take 325 mg by mouth as needed.    [provider]  acetaminophen-codeine (TYLENOL #3) 300-30 MG tablet Take 1 tablet by mouth daily as needed for moderate pain. 01/20/19   Colon Branch, MD  Chromium Picolinate 1000 MCG TABS Take 1,000 mcg by mouth every evening.    [provider]  Coenzyme Q10 (CO Q 10) 100 MG CAPS Take 100 mg by mouth daily.      [provider]  Cranberry 600 MG TABS  12/02/17   [provider]  diltiazem (CARDIZEM) 30 MG tablet Take 1 tablet every 4 hours AS NEEDED for heart rate >100 as long as blood pressure >100. 11/06/18   [name], MD  flecainide (TAMBOCOR) 150 MG tablet Take 2 tablets (300 mg total) by mouth daily as needed (FOR AFIB). Pill in a pocket 11/06/18   [name], MD  fluticasone [facility]) 50 MCG/ACT nasal spray Place 1 spray into both nostrils daily.     [provider]  loperamide (IMODIUM A-D) 2 MG tablet Take 2 mg by mouth daily.     [provider]  Magnesium 400 MG TABS Taking 400mg  of magnesium oxide by mouth in the am and the other magnesium tablet 250mg  at night    [provider]  Omega-3 Fatty Acids (FISH OIL) 1000 MG CAPS Take 1,000 mg by mouth daily.     [provider]  omeprazole (PRILOSEC) 20 MG capsule Take 20 mg by mouth daily.    [provider]  pravastatin (PRAVACHOL) 40 MG tablet Take 1 tablet (40 mg total) by mouth at bedtime. 08/05/19   Colon Branch, MD  rivaroxaban (XARELTO) 20 MG TABS tablet TAKE 1 TABLET BY MOUTH  DAILY WITH SUPPER 06/24/19   Allred, Jeneen Rinks, MD  valsartan (DIOVAN) 320 MG tablet Take 0.5 tablets (160 mg total) by mouth 2 (two) times daily. 10/21/18   Colon Branch, MD  vitamin B-12 (CYANOCOBALAMIN) 1000 MCG tablet Take 2,000 mcg by mouth every evening.     [provider]    Family History Family History  Problem Relation Age of Onset  . Hypertension Mother   . Colon cancer Mother   . Breast cancer Mother   . Ovarian cancer Mother   . Hypertension Father   . Diabetes Father   . Head & neck cancer Sister   . CAD Neg Hx     Social History Social History   Tobacco Use  . Smoking status: Former Smoker    Quit date: 01/06/1980    Years since quitting: 39.6  . Smokeless tobacco: Never Used  . Tobacco comment: smoked from 1970 to 1982, less than 1 ppd  Vaping Use  . Vaping Use:  Never used  Substance Use Topics  . Alcohol use: Yes    Comment: rare  . Drug use: No     Allergies   Nsaids, Penicillins, Tolmetin, Zoster vaccine live, and Amoxicillin   Review of Systems As per HPI  Physical Exam Triage Vital Signs ED Triage Vitals  Enc Vitals Group     BP      Pulse      Resp      Temp      Temp src      SpO2      Weight      Height      Head Circumference      Peak Flow      Pain Score      Pain Loc      Pain Edu?      Excl. in [place]?    No data found.  Updated Vital Signs BP (!) 142/74 (BP Location: Left Arm)   Pulse 75   Temp 98.1 F (36.7 C) (Oral)   Resp 18   SpO2 97%   Visual Acuity Right Eye Distance:   Left Eye Distance:   Bilateral Distance:    Right Eye Near:   Left Eye Near:    Bilateral Near:     Physical Exam Constitutional:      General: She is not in acute distress. HENT:     Head: Normocephalic and atraumatic.  Eyes:     General: No scleral icterus.    Pupils: Pupils are equal, round, and reactive to light.  Cardiovascular:     Rate and Rhythm: Normal rate.  Pulmonary:     Effort: Pulmonary effort is normal.  Abdominal:     General: Bowel sounds are normal.     Palpations: Abdomen is soft.     Tenderness: There is no abdominal tenderness. There is no right CVA tenderness, left CVA tenderness or guarding.  Skin:    Coloration: Skin is not jaundiced or pale.  Neurological:     Mental Status: She is alert and oriented to person, place, and time.      UC Treatments / Results  Labs (all labs ordered are listed, but only abnormal results are displayed) Labs Reviewed  POCT URINALYSIS DIP (MANUAL ENTRY) - Abnormal; Notable for the following components:      Result Value   Color, UA light yellow (*)    Blood, UA large (*)    Protein Ur, POC =100 (*)    Leukocytes, UA Large (3+) (*)    All other components within normal limits  URINE CULTURE    EKG   Radiology No results  found.  Procedures Procedures (including critical care time)  Medications Ordered in UC Medications - No data to display  Initial Impression / Assessment and Plan / UC Course  I have reviewed the triage vital signs and the nursing notes.  Pertinent labs & imaging results that were available during my care of the patient were reviewed by me and considered in my medical decision making (see chart for details).     Afebrile, nontoxic in office today.  Last seen for this 07/27/2019: Urine culture negative.  Urine with large leukocytes, protein, blood.  Given patient's history, parents today in office, will await culture prior to treating with antibiotics given she recently completed course.  Patient has follow-up pending with urology: Stressed importance of keeping this for further evaluation/management of possible cystitis.  Return precautions discussed, pt verbalized understanding and is agreeable to plan. Final Clinical Impressions(s) / UC Diagnoses   Final diagnoses:  Cystitis     Discharge Instructions     Urine culture pending: We will treat with antibiotics if indicated.   Important that you follow-up with urology for further evaluation  of your recurrent cystitis. Return sooner for worsening pain, bleeding, fever.    ED Prescriptions    None     PDMP not reviewed this encounter.   [place], [place], [place] 08/11/19 1037

## 2019-08-11 NOTE — ED Triage Notes (Signed)
Pt c/o urinary urgency for the past few days and has worsen over the weekend.

## 2019-08-11 NOTE — Discharge Instructions (Addendum)
Urine culture pending: We will treat with antibiotics if indicated.   Important that you follow-up with urology for further evaluation of your recurrent cystitis. Return sooner for worsening pain, bleeding, fever.

## 2019-08-12 LAB — URINE CULTURE

## 2019-08-13 ENCOUNTER — Other Ambulatory Visit: Payer: Self-pay

## 2019-08-13 ENCOUNTER — Ambulatory Visit: Payer: Self-pay

## 2019-08-13 ENCOUNTER — Encounter: Payer: Self-pay | Admitting: Family Medicine

## 2019-08-13 ENCOUNTER — Ambulatory Visit (INDEPENDENT_AMBULATORY_CARE_PROVIDER_SITE_OTHER): Payer: Medicare Other | Admitting: Family Medicine

## 2019-08-13 VITALS — BP 132/70 | HR 76 | Temp 97.9°F | Resp 16 | Ht 68.0 in | Wt 257.0 lb

## 2019-08-13 DIAGNOSIS — R300 Dysuria: Secondary | ICD-10-CM | POA: Diagnosis not present

## 2019-08-13 DIAGNOSIS — R310 Gross hematuria: Secondary | ICD-10-CM | POA: Diagnosis not present

## 2019-08-13 DIAGNOSIS — R350 Frequency of micturition: Secondary | ICD-10-CM

## 2019-08-13 LAB — POCT URINALYSIS DIPSTICK
Bilirubin, UA: NEGATIVE
Blood, UA: NEGATIVE
Glucose, UA: NEGATIVE
Ketones, UA: NEGATIVE
Nitrite, UA: NEGATIVE
Protein, UA: NEGATIVE
Urobilinogen, UA: 0.2 E.U./dL
pH, UA: 6 (ref 5.0–8.0)

## 2019-08-13 MED ORDER — NITROFURANTOIN MONOHYD MACRO 100 MG PO CAPS
100.0000 mg | ORAL_CAPSULE | Freq: Two times a day (BID) | ORAL | 0 refills | Status: AC
Start: 2019-08-13 — End: 2019-08-18

## 2019-08-13 MED ORDER — PHENAZOPYRIDINE HCL 200 MG PO TABS: 200.0000 mg | ORAL_TABLET | Freq: Three times a day (TID) | ORAL | 0 refills | Status: AC | PRN

## 2019-08-13 NOTE — Patient Instructions (Addendum)
A few things to remember from today's visit:   Urinary frequency - Plan: POCT urinalysis dipstick, Culture, Urine, Culture, Urine  Gross hematuria - Plan: Ambulatory referral to Urology  It does not seem to be infectious, but you can try Macrobid and if not better in 48 hours you can stop it. Urology referral placed. Adequate hydration.  If you need refills please call your pharmacy. Do not use My Chart to request refills or for acute issues that need immediate attention.    Please be sure medication list is accurate. If a new problem present, please set up appointment sooner than planned today.

## 2019-08-13 NOTE — Progress Notes (Deleted)
ACUTE VISIT Chief Complaint  Patient presents with  . Urinary Tract Infection   HPI: Veronica Bradford is a 74 y.o. female with history of diastolic dysfunction grade 2, atrial fibrillation on chronic anticoagulation, hypertension, and OSA here today complaining of urinary symptoms for at least 8 weeks. Gross hematuria, last episode 2 days ago. Severe burning and pressure sensation with urination. Nocturia x2-3. Problem is interfering with his sleep, last night she slept 2 hours.  She has been evaluated in the ED on 07/27/2019 and 08/11/2019. Initially treated with nitrofurantoin, temporary relief. Recently completed treatment with Keflex. Ucx 07/27/19: No growth. Ucx 08/11/19:Multiple species present, suggest recollection.  She thinks bacteria may not be growing in cultures because she is drinking a lot of water. She would like to have another urine culture.  No history of nephrolithiasis. Reported history of hypercalcemia.  Endometrial cancer diagnosed in 2016 after hysterectomy, she will require radiation or chemotherapy. Former smoker. Family history negative for urinary tract malignancy.  Atrial fibrillation on Xarelto 20 mg daily. She has been in the ER a few weeks ago complaining of chest pain. She is following with cardiologist, currently she had a cardiac monitor. Chest pain has resolved.  Lab Results  Component Value Date   CREATININE 0.67 07/24/2019   BUN 19 07/24/2019   NA 140 07/24/2019   K 3.7 07/24/2019   CL 106 07/24/2019   CO2 27 07/24/2019   She also reports recent diagnosis of anemia. She is following with GI.  Lab Results  Component Value Date   WBC 7.8 07/24/2019   HGB 11.2 (L) 07/24/2019   HCT 36.0 07/24/2019   MCV 84.7 07/24/2019   PLT 249 07/24/2019   Negative for fever. She has had chills for the past 2 to 3 days. + Decreased appetite and nausea. Negative for abdominal pain, vomiting, changes in bowel habits, blood in the stool, or  melena. She is positive that there is no vaginal bleeding and negative for vaginal discharge.  History of back pain, she denied having more pain than usual.  Review of Systems  Constitutional: Positive for fatigue. Negative for activity change and appetite change.  HENT: Negative for mouth sores and nosebleeds.   Respiratory: Negative for cough and wheezing.   Cardiovascular: Negative for chest pain.  Gastrointestinal: Negative for abdominal pain, nausea and vomiting.  Musculoskeletal: Negative for back pain and myalgias.  Rest see pertinent positives and negatives per HPI.  Current Outpatient Medications on File Prior to Visit  Medication Sig Dispense Refill  . acetaminophen (TYLENOL) 325 MG tablet Take 325 mg by mouth as needed.    Marland Kitchen acetaminophen-codeine (TYLENOL #3) 300-30 MG tablet Take 1 tablet by mouth daily as needed for moderate pain. 30 tablet 0  . Chromium Picolinate 1000 MCG TABS Take 1,000 mcg by mouth every evening.    . Coenzyme Q10 (CO Q 10) 100 MG CAPS Take 100 mg by mouth daily.     . Cranberry 600 MG TABS     . diltiazem (CARDIZEM) 30 MG tablet Take 1 tablet every 4 hours AS NEEDED for heart rate >100 as long as blood pressure >100. 45 tablet 5  . flecainide (TAMBOCOR) 150 MG tablet Take 2 tablets (300 mg total) by mouth daily as needed (FOR AFIB). Pill in a pocket 60 tablet 5  . fluticasone (FLONASE) 50 MCG/ACT nasal spray Place 1 spray into both nostrils daily.     Marland Kitchen loperamide (IMODIUM A-D) 2 MG tablet Take 2 mg by  mouth daily.     . Magnesium 400 MG TABS Taking 400mg  of magnesium oxide by mouth in the am and the other magnesium tablet 250mg  at night    . Omega-3 Fatty Acids (FISH OIL) 1000 MG CAPS Take 1,000 mg by mouth daily.     Marland Kitchen omeprazole (PRILOSEC) 20 MG capsule Take 20 mg by mouth daily.    . pravastatin (PRAVACHOL) 40 MG tablet Take 1 tablet (40 mg total) by mouth at bedtime. 90 tablet 0  . Pyridoxine HCl (VITAMIN B6) 200 MG TABS     . rivaroxaban  (XARELTO) 20 MG TABS tablet TAKE 1 TABLET BY MOUTH  DAILY WITH SUPPER 30 tablet 5  . valsartan (DIOVAN) 320 MG tablet Take 0.5 tablets (160 mg total) by mouth 2 (two) times daily. 90 tablet 2  . vitamin B-12 (CYANOCOBALAMIN) 1000 MCG tablet Take 2,000 mcg by mouth every evening.      No current facility-administered medications on file prior to visit.     Past Medical History:  Diagnosis Date  . Atrial fibrillation ([place])   . Back pain   . Chronic sinusitis    s/p 2 surgeries remotely, [name]  . Closed fracture of metatarsal of left foot    L foot, fifth metatarsal  . Ectopic pregnancy   . Endometrial cancer ([place]) 2004   Early diagnosis  . GERD (gastroesophageal reflux disease)   . Hyperlipemia   . Hypertension   . IBS (irritable bowel syndrome) 08/13/2014   Dx 2015, diarrhea on-off, Dr [name]   . Lumbar radiculopathy 08/2012   MRI in 08/2012  . Mild depression ([place])    Pt's son has Down's Syndrome  . Mild hyperparathyroidism ([place])   . Osteopenia   . PAF (paroxysmal atrial fibrillation) ([place])   . Prediabetes   . Sciatica of right side 08/2013   Received Depomedrol 80 mg injection  . Sleep apnea 09/09/2013   Dx with OSA in 2015, by Dr. Caryl Comes, APAP   Allergies  Allergen Reactions  . Nsaids Other (See Comments)    Pt on Xarelto  . Penicillins     Local reaction  . Tolmetin Other (See Comments)    Pt on Xarelto  . Zoster Vaccine Live Other (See Comments)    Sickness per Pt.   Marland Kitchen Amoxicillin Rash    Social History   Socioeconomic History  . Marital status: Married    Spouse name: Not on file  . Number of children: 1  . Years of education: Not on file  . Highest education level: Not on file  Occupational History  . 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 Smoking status: Former Smoker    Quit date: 01/06/1980    Years since quitting: 39.6  . Smokeless tobacco: Never Used  . Tobacco comment: smoked from 1970 to 1982, less than 1 ppd  Vaping Use  . Vaping  Use: Never used  Substance and Sexual Activity  . Alcohol use: Yes    Comment: rare  . Drug use: No  . Sexual activity: Not Currently    Birth control/protection: None  Other Topics Concern  . Not on file  Social History Narrative   Lives w/ husband, and son [name]   Retired Marine scientist   Lives in [place] Strain: [place]   . Difficulty of Paying Living Expenses: Not hard at all  Food Insecurity: No Food Insecurity  . Worried About Running  Out of Food in the Last Year: Never true  . Ran Out of Food in the Last Year: Never true  Transportation Needs: No Transportation Needs  . Lack of Transportation (Medical): No  . Lack of Transportation (Non-Medical): No  Physical Activity:   . Days of Exercise per Week:   . Minutes of Exercise per Session:   Stress:   . Feeling of Stress :   Social Connections:   . Frequency of Communication with Friends and Family:   . Frequency of Social Gatherings with Friends and Family:   . Attends Religious Services:   . Active Member of Clubs or Organizations:   . Attends Archivist Meetings:   Marland Kitchen Marital Status:     Vitals:   08/13/19 1113  BP: 132/70  Pulse: 76  Temp: 97.9 F (36.6 C)  SpO2: 99%   Body mass index is 39.08 kg/m.  Physical Exam Constitutional:      General: She is not in acute distress.    Appearance: She is well-developed.  HENT:     Head: Normocephalic and atraumatic.     Mouth/Throat:     Mouth: Mucous membranes are moist.     Pharynx: Oropharynx is clear.  Eyes:     Conjunctiva/sclera: Conjunctivae normal.  Cardiovascular:     Rate and Rhythm: Normal rate and regular rhythm.  Pulmonary:     Effort: Pulmonary effort is normal. No respiratory distress.     Breath sounds: Normal breath sounds.  Abdominal:     Palpations: Abdomen is soft. There is no mass.     Tenderness: There is no abdominal tenderness.  Lymphadenopathy:     Cervical: No cervical  adenopathy.  Skin:    General: Skin is warm.     Findings: No erythema.  Neurological:     Mental Status: She is alert and oriented to person, place, and time.  Psychiatric:        Mood and Affect: Mood is anxious.        Speech: Speech normal.     ASSESSMENT AND PLAN:  Veronica Bradford was seen today for urinary tract infection.  Diagnoses and all orders for this visit:  Urinary frequency -     POCT urinalysis dipstick -     Culture, Urine; Future -     Culture, Urine     No follow-ups on file.   Nasser Ku G. [place], MD  [facility]. [place] office.  Discharge Instructions   None

## 2019-08-13 NOTE — Progress Notes (Signed)
ACUTE VISIT Chief Complaint  Patient presents with  . Urinary Tract Infection   HPI: [name] is a 74 y.o. female  with history of diastolic dysfunction grade 2, atrial fibrillation on chronic anticoagulation, hypertension, and OSA here today complaining of urinary symptoms for at least 8 weeks. Gross hematuria, last episode 2 days ago. Severe burning and pressure sensation with urination. Nocturia x2-3. Problem is interfering with his sleep, last night she slept 2 hours.  She has been evaluated in the ED on 07/27/2019 and 08/11/2019. Initially treated with nitrofurantoin, temporary relief. Recently completed treatment with Keflex. Ucx 07/27/19: No growth. Ucx 08/11/19:Multiple species present, suggest recollection.  She thinks high water intake may have caused negative urine cultures.  She would like to have another urine culture.  No history of nephrolithiasis. Reported history of hypercalcemia.  Endometrial cancer diagnosed in 2016 after hysterectomy, she will require radiation or chemotherapy. Former smoker. Family history negative for urinary tract malignancy.  Atrial fibrillation on Xarelto 20 mg daily. She has been in the ER a few weeks ago complaining of chest pain. She is following with cardiologist, currently she had a cardiac monitor. Chest pain has resolved.  Lab Results  Component Value Date   CREATININE 0.67 07/24/2019   BUN 19 07/24/2019   NA 140 07/24/2019   K 3.7 07/24/2019   CL 106 07/24/2019   CO2 27 07/24/2019   She also reports recent diagnosis of anemia. She is following with GI.  Lab Results  Component Value Date   WBC 7.8 07/24/2019   HGB 11.2 (L) 07/24/2019   HCT 36.0 07/24/2019   MCV 84.7 07/24/2019   PLT 249 07/24/2019   Negative for fever. She has had chills for the past 2 to 3 days. + Decreased appetite and nausea. Negative for abdominal pain, vomiting, changes in bowel habits, blood in the stool, or melena. She is positive  that there is no vaginal bleeding and negative for vaginal discharge and pruritus.  History of back pain, she denied having more pain than usual.  Review of Systems  Constitutional: Positive for activity change, appetite change and fatigue.  HENT: Negative for mouth sores and nosebleeds.   Endocrine: Negative for polydipsia, polyphagia and polyuria.  Genitourinary: Positive for dysuria. Negative for decreased urine volume and flank pain.  Musculoskeletal: Negative for gait problem and joint swelling.  Skin: Negative for pallor and rash.  Neurological: Negative for syncope and weakness.  Psychiatric/Behavioral: Positive for sleep disturbance. Negative for confusion. The patient is nervous/anxious.   Rest see pertinent positives and negatives per HPI.  Current Outpatient Medications on File Prior to Visit  Medication Sig Dispense Refill  . acetaminophen (TYLENOL) 325 MG tablet Take 325 mg by mouth as needed.    Marland Kitchen acetaminophen-codeine (TYLENOL #3) 300-30 MG tablet Take 1 tablet by mouth daily as needed for moderate pain. 30 tablet 0  . Chromium Picolinate 1000 MCG TABS Take 1,000 mcg by mouth every evening.    . Coenzyme Q10 (CO Q 10) 100 MG CAPS Take 100 mg by mouth daily.     . Cranberry 600 MG TABS     . diltiazem (CARDIZEM) 30 MG tablet Take 1 tablet every 4 hours AS NEEDED for heart rate >100 as long as blood pressure >100. 45 tablet 5  . flecainide (TAMBOCOR) 150 MG tablet Take 2 tablets (300 mg total) by mouth daily as needed (FOR AFIB). Pill in a pocket 60 tablet 5  . fluticasone (FLONASE) 50 MCG/ACT nasal  spray Place 1 spray into both nostrils daily.     Marland Kitchen loperamide (IMODIUM A-D) 2 MG tablet Take 2 mg by mouth daily.     . Magnesium 400 MG TABS Taking 400mg  of magnesium oxide by mouth in the am and the other magnesium tablet 250mg  at night    . Omega-3 Fatty Acids (FISH OIL) 1000 MG CAPS Take 1,000 mg by mouth daily.     Marland Kitchen omeprazole (PRILOSEC) 20 MG capsule Take 20 mg by mouth  daily.    . pravastatin (PRAVACHOL) 40 MG tablet Take 1 tablet (40 mg total) by mouth at bedtime. 90 tablet 0  . Pyridoxine HCl (VITAMIN B6) 200 MG TABS     . rivaroxaban (XARELTO) 20 MG TABS tablet TAKE 1 TABLET BY MOUTH  DAILY WITH SUPPER 30 tablet 5  . valsartan (DIOVAN) 320 MG tablet Take 0.5 tablets (160 mg total) by mouth 2 (two) times daily. 90 tablet 2  . vitamin B-12 (CYANOCOBALAMIN) 1000 MCG tablet Take 2,000 mcg by mouth every evening.      No current facility-administered medications on file prior to visit.   Past Medical History:  Diagnosis Date  . Atrial fibrillation ([place])   . Back pain   . Chronic sinusitis    s/p 2 surgeries remotely, [name]  . Closed fracture of metatarsal of left foot    L foot, fifth metatarsal  . Ectopic pregnancy   . Endometrial cancer ([place]) 2004   Early diagnosis  . GERD (gastroesophageal reflux disease)   . Hyperlipemia   . Hypertension   . IBS (irritable bowel syndrome) 08/13/2014   Dx 2015, diarrhea on-off, Dr [name]   . Lumbar radiculopathy 08/2012   MRI in 08/2012  . Mild depression ([place])    Pt's son has Down's Syndrome  . Mild hyperparathyroidism ([place])   . Osteopenia   . PAF (paroxysmal atrial fibrillation) ([place])   . Prediabetes   . Sciatica of right side 08/2013   Received Depomedrol 80 mg injection  . Sleep apnea 09/09/2013   Dx with OSA in 2015, by Dr. Caryl Comes, APAP   Allergies  Allergen Reactions  . Nsaids Other (See Comments)    Pt on Xarelto  . Penicillins     Local reaction  . Tolmetin Other (See Comments)    Pt on Xarelto  . Zoster Vaccine Live Other (See Comments)    Sickness per Pt.   Marland Kitchen Amoxicillin Rash    Social History   Socioeconomic History  . Marital status: Married    Spouse name: Not on file  . Number of children: 1  . Years of education: Not on file  . Highest education level: Not on file  Occupational History  . 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 Smoking status: Former Smoker     Quit date: 01/06/1980    Years since quitting: 39.6  . Smokeless tobacco: Never Used  . Tobacco comment: smoked from 1970 to 1982, less than 1 ppd  Vaping Use  . Vaping Use: Never used  Substance and Sexual Activity  . Alcohol use: Yes    Comment: rare  . Drug use: No  . Sexual activity: Not Currently    Birth control/protection: None  Other Topics Concern  . Not on file  Social History Narrative   Lives w/ husband, and son [name]   Retired Marine scientist   Lives in [place] Strain: [place]   .  Difficulty of Paying Living Expenses: Not hard at all  Food Insecurity: No Food Insecurity  . Worried About Charity fundraiser in the Last Year: Never true  . Ran Out of Food in the Last Year: Never true  Transportation Needs: No Transportation Needs  . Lack of Transportation (Medical): No  . Lack of Transportation (Non-Medical): No  Physical Activity:   . Days of Exercise per Week:   . Minutes of Exercise per Session:   Stress:   . Feeling of Stress :   Social Connections:   . Frequency of Communication with Friends and Family:   . Frequency of Social Gatherings with Friends and Family:   . Attends Religious Services:   . Active Member of Clubs or Organizations:   . Attends Archivist Meetings:   Marland Kitchen Marital Status:    Vitals:   08/13/19 1113  BP: 132/70  Pulse: 76  Resp: 16  Temp: 97.9 F (36.6 C)  SpO2: 99%   Body mass index is 39.08 kg/m.  Physical Exam Vitals and nursing note reviewed.  Constitutional:      General: She is not in acute distress.    Appearance: She is well-developed.  HENT:     Head: Normocephalic and atraumatic.     Mouth/Throat:     Mouth: Mucous membranes are dry.  Eyes:     Conjunctiva/sclera: Conjunctivae normal.  Cardiovascular:     Rate and Rhythm: Normal rate and regular rhythm.     Heart sounds: Murmur (Soft SEM RUSB) heard.   Pulmonary:     Effort: Pulmonary effort is normal.  No respiratory distress.     Breath sounds: Normal breath sounds.  Abdominal:     Palpations: Abdomen is soft. There is no mass.     Tenderness: There is no abdominal tenderness. There is no right CVA tenderness or left CVA tenderness.  Musculoskeletal:     Right lower leg: No edema.     Left lower leg: No edema.  Skin:    General: Skin is warm.     Findings: No erythema.  Neurological:     General: No focal deficit present.     Mental Status: She is alert and oriented to person, place, and time.  Psychiatric:        Mood and Affect: Mood is anxious.   ASSESSMENT AND PLAN:  Ms. Demica was seen today for urinary tract infection.  Diagnoses and all orders for this visit: Orders Placed This Encounter  Procedures  . Culture, Urine  . Ambulatory referral to Urology  . POCT urinalysis dipstick    Gross hematuria Probably has been recovering for at least 2 months. We discussed all possible etiologies, malignancy needs to be considered. Anticoagulation could aggravate problem. Recommend urology evaluation, referral placed. Instructed about warning signs.  Urinary frequency Urine dipstick 1+ leukocytes, rest negative. She has been on antibiotic treatment with no resolution of symptoms. Explained that it does not seem to be caused by an infectious process. She would like urine sent for culture.  Dysuria Symptomatic treatment with phenazopyridine. She is not reporting gynecologic symptoms, so GU examination was not done today. Adequate hydration. A few years ago nitrofurantoin help with problem, she would like to try antibiotic again. Macrobid 100 mg twice daily, instructed to stop medication if not improvement in 48 hours.  -     phenazopyridine (PYRIDIUM) 200 MG tablet; Take 1 tablet (200 mg total) by mouth 3 (three) times daily as needed for up  to 3 days for pain.  Other orders -     nitrofurantoin, macrocrystal-monohydrate, (MACROBID) 100 MG capsule; Take 1 capsule (100 mg  total) by mouth 2 (two) times daily for 5 days.  Return if symptoms worsen or fail to improve, for Urologic evaluation..  Dempsey Knotek G. [place], MD  [facility]. [place] office.  Discharge Instructions       A few things to remember from today's visit:   Urinary frequency - Plan: POCT urinalysis dipstick, Culture, Urine, Culture, Urine  Gross hematuria - Plan: Ambulatory referral to Urology  It does not seem to be infectious, but you can try Macrobid and if not better in 48 hours you can stop it. Urology referral placed. Adequate hydration.  If you need refills please call your pharmacy. Do not use My Chart to request refills or for acute issues that need immediate attention.    Please be sure medication list is accurate. If a new problem present, please set up appointment sooner than planned today.

## 2019-08-15 LAB — URINE CULTURE
MICRO NUMBER:: 10813898
SPECIMEN QUALITY:: ADEQUATE

## 2019-08-16 ENCOUNTER — Other Ambulatory Visit: Payer: Self-pay | Admitting: Family Medicine

## 2019-08-16 ENCOUNTER — Encounter: Payer: Self-pay | Admitting: Family Medicine

## 2019-08-16 MED ORDER — SULFAMETHOXAZOLE-TRIMETHOPRIM 800-160 MG PO TABS
1.0000 | ORAL_TABLET | Freq: Two times a day (BID) | ORAL | 0 refills | Status: AC
Start: 2019-08-16 — End: 2019-08-19

## 2019-08-16 NOTE — Progress Notes (Signed)
Pt called after hours nurse. Did not tolerate macrobid and requesting different rx. Culture shows pansensitive klebsiella. Will send in bactrim due to penicillin allergy.  Algis Greenhouse. Jerline Pain, MD 08/16/2019 3:42 PM

## 2019-08-26 DIAGNOSIS — R002 Palpitations: Secondary | ICD-10-CM | POA: Diagnosis not present

## 2019-08-27 ENCOUNTER — Encounter: Payer: Self-pay | Admitting: Internal Medicine

## 2019-08-29 ENCOUNTER — Telehealth: Payer: Self-pay

## 2019-08-29 ENCOUNTER — Telehealth: Payer: Medicare Other | Admitting: Internal Medicine

## 2019-08-29 NOTE — Telephone Encounter (Signed)
Spoke with pt regarding virtual appt on 09/01/19. Pt stated she will check her vitals and upload an EKG prior to her appt. Pt agreed and confirmed virtual visit.

## 2019-09-01 ENCOUNTER — Other Ambulatory Visit: Payer: Self-pay

## 2019-09-01 ENCOUNTER — Telehealth (INDEPENDENT_AMBULATORY_CARE_PROVIDER_SITE_OTHER): Payer: Medicare Other | Admitting: Internal Medicine

## 2019-09-01 VITALS — BP 137/48 | HR 56 | Ht 68.0 in | Wt 253.0 lb

## 2019-09-01 DIAGNOSIS — I10 Essential (primary) hypertension: Secondary | ICD-10-CM

## 2019-09-01 DIAGNOSIS — I480 Paroxysmal atrial fibrillation: Secondary | ICD-10-CM | POA: Diagnosis not present

## 2019-09-01 DIAGNOSIS — G4733 Obstructive sleep apnea (adult) (pediatric): Secondary | ICD-10-CM

## 2019-09-01 DIAGNOSIS — D6869 Other thrombophilia: Secondary | ICD-10-CM | POA: Diagnosis not present

## 2019-09-01 NOTE — Progress Notes (Signed)
Electrophysiology TeleHealth Note   Due to national recommendations of social distancing due to COVID 19, an audio/video telehealth visit is felt to be most appropriate for this patient at this time.  See MyChart message from today for the patient's consent to telehealth for [facility].  Date:  09/01/2019   ID:  Veronica Bradford, DOB 1945-12-23, MRN [id number]  Location: patient's home  Provider location:  [place]  Evaluation Performed: Follow-up visit  PCP:  Colon Branch, MD   Electrophysiologist:  Dr Rayann Heman  Chief Complaint:  palpitations  History of Present Illness:    Veronica Bradford is a 74 y.o. female who presents via telehealth conferencing today.  Since last being seen in our clinic, the patient reports doing very well.  She was in the ED in July with afib and chest pain.  She was seen by EP APP subsequently.  She has done well since, without further episodes. Today, she denies symptoms of palpitations, exertional chest pain, shortness of breath,  lower extremity edema, dizziness, presyncope, or syncope.  The patient is otherwise without complaint today.   Past Medical History:  Diagnosis Date  . Atrial fibrillation ([place])   . Back pain   . Chronic sinusitis    s/p 2 surgeries remotely, [name]  . Closed fracture of metatarsal of left foot    L foot, fifth metatarsal  . Ectopic pregnancy   . Endometrial cancer ([place]) 2004   Early diagnosis  . GERD (gastroesophageal reflux disease)   . Hyperlipemia   . Hypertension   . IBS (irritable bowel syndrome) 08/13/2014   Dx 2015, diarrhea on-off, Dr [name]   . Lumbar radiculopathy 08/2012   MRI in 08/2012  . Mild depression ([place])    Pt's son has Down's Syndrome  . Mild hyperparathyroidism ([place])   . Osteopenia   . PAF (paroxysmal atrial fibrillation) ([place])   . Prediabetes   . Sciatica of right side 08/2013   Received Depomedrol 80 mg injection  . Sleep apnea 09/09/2013   Dx with OSA in 2015, by Dr. Caryl Comes, APAP     Past Surgical History:  Procedure Laterality Date  . ABDOMINAL HYSTERECTOMY    . ATRIAL FIBRILLATION ABLATION N/A 09/04/2017   Procedure: ATRIAL FIBRILLATION ABLATION;  Surgeon: [name], MD;  Location: [place] [facility];  Service: Cardiovascular;  Laterality: N/A;  . CARPAL TUNNEL RELEASE Right 06/17/2019  . CHOLECYSTECTOMY  ~2006  . DILATION AND CURETTAGE OF UTERUS     after SAB  . ESOPHAGOGASTRODUODENOSCOPY  02/09/2014   Dr. [name]  . FINGER SURGERY Left    index  . NASAL SINUS SURGERY     x 2 remotely, [name]  . TONSILLECTOMY    . TOTAL ABDOMINAL HYSTERECTOMY W/ BILATERAL SALPINGOOPHORECTOMY    . TOTAL KNEE ARTHROPLASTY Right ~2008    Current Outpatient Medications  Medication Sig Dispense Refill  . acetaminophen (TYLENOL) 325 MG tablet Take 325 mg by mouth as needed.    Marland Kitchen acetaminophen-codeine (TYLENOL #3) 300-30 MG tablet Take 1 tablet by mouth daily as needed for moderate pain. 30 tablet 0  . Chromium Picolinate 1000 MCG TABS Take 1,000 mcg by mouth every evening.    . Coenzyme Q10 (CO Q 10) 100 MG CAPS Take 100 mg by mouth daily.     . Cranberry 600 MG TABS     . diltiazem (CARDIZEM) 30 MG tablet Take 1 tablet every 4 hours AS NEEDED for heart rate >100 as  long as blood pressure >100. 45 tablet 5  . flecainide (TAMBOCOR) 150 MG tablet Take 2 tablets (300 mg total) by mouth daily as needed (FOR AFIB). Pill in a pocket 60 tablet 5  . fluticasone (FLONASE) 50 MCG/ACT nasal spray Place 1 spray into both nostrils daily.     . furosemide (LASIX) 20 MG tablet Take 20 mg by mouth as needed for fluid.    Marland Kitchen loperamide (IMODIUM A-D) 2 MG tablet Take 2 mg by mouth daily.     . Magnesium 400 MG TABS Taking 400mg  of magnesium oxide by mouth in the am and the other magnesium tablet 250mg  at night    . Omega-3 Fatty Acids (FISH OIL) 1000 MG CAPS Take 1,000 mg by mouth daily.     Marland Kitchen omeprazole (PRILOSEC) 20 MG capsule Take 20 mg by mouth daily.    . pravastatin (PRAVACHOL) 40 MG  tablet Take 1 tablet (40 mg total) by mouth at bedtime. 90 tablet 0  . Pyridoxine HCl (VITAMIN B6) 200 MG TABS     . rivaroxaban (XARELTO) 20 MG TABS tablet TAKE 1 TABLET BY MOUTH  DAILY WITH SUPPER 30 tablet 5  . valsartan (DIOVAN) 320 MG tablet Take 0.5 tablets (160 mg total) by mouth 2 (two) times daily. 90 tablet 2  . vitamin B-12 (CYANOCOBALAMIN) 1000 MCG tablet Take 2,000 mcg by mouth every evening.      No current facility-administered medications for this visit.    Allergies:   Nsaids, Penicillins, Tolmetin, Zoster vaccine live, and Amoxicillin   Social History:  The patient  reports that she quit smoking about 39 years ago. She has never used smokeless tobacco. She reports current alcohol use. She reports that she does not use drugs.   ROS:  Please see the history of present illness.   All other systems are personally reviewed and negative.    Exam:    Vital Signs:  BP (!) 137/48   Pulse (!) 56   Ht 5\' 8"  (1.727 m)   Wt 253 lb (114.8 kg)   BMI 38.47 kg/m   Well sounding and appearing, alert and conversant, regular work of breathing,  good skin color Eyes- anicteric, neuro- grossly intact, skin- no apparent rash or lesions or cyanosis, mouth- oral mucosa is pink  Labs/Other Tests and Data Reviewed:    Recent Labs: 09/19/2018: TSH 1.14 07/24/2019: BUN 19; Creatinine, Ser 0.67; Magnesium 2.1; Potassium 3.7; Sodium 140 08/01/2019: Hemoglobin 12.5; Platelets 285   Wt Readings from Last 3 Encounters:  09/01/19 253 lb (114.8 kg)  08/13/19 257 lb (116.6 kg)  08/07/19 263 lb (119.3 kg)    Echo, event monitor, and myoview were reviewed with her at length today.  ASSESSMENT & PLAN:    1.  Paroxysmal atrial fibrillation/ atrial flutter S/p PVI/CTI 2019 She has done well post ablation, despite severe LA enlargement She takes xarelto for stroke prevention for chads2vasc score of 4. She takes flecainide as a pill in pocket medicine We discussed importantance of close follow-up  to avoid toxicity with this medicine.  2. HTN Stable No change required today  3. OSA Compliant with CPAP  4. Obesity She continues to work diligently on this  5. preop She needs to have a colonoscopy and also carpel tunnel release Ok to proceed with these procedures without further CV testing Hold xarelto 24-48 hours prior to the procedure and resume as soon as she can after   Risks, benefits and potential toxicities for medications prescribed and/or  refilled reviewed with patient today.   Follow-up:  3 months in AF clinic   Patient Risk:  after full review of this patients clinical status, I feel that they are at moderate risk at this time.  Today, I have spent 15 minutes with the patient with telehealth technology discussing arrhythmia management .    Army Fossa, MD  09/01/2019 9:41 AM     [facility] [address] [place] [address] [phone] (office) [phone] (fax)

## 2019-09-04 ENCOUNTER — Encounter: Payer: Self-pay | Admitting: Internal Medicine

## 2019-09-04 ENCOUNTER — Other Ambulatory Visit: Payer: Self-pay | Admitting: Family Medicine

## 2019-09-04 MED ORDER — SULFAMETHOXAZOLE-TRIMETHOPRIM 800-160 MG PO TABS
1.0000 | ORAL_TABLET | Freq: Two times a day (BID) | ORAL | 0 refills | Status: DC
Start: 2019-09-04 — End: 2019-12-29

## 2019-09-04 NOTE — Telephone Encounter (Signed)
Please advise in PCP absence.  

## 2019-09-05 ENCOUNTER — Ambulatory Visit (INDEPENDENT_AMBULATORY_CARE_PROVIDER_SITE_OTHER): Payer: Medicare Other | Admitting: Medical

## 2019-09-05 ENCOUNTER — Other Ambulatory Visit: Payer: Self-pay

## 2019-09-05 VITALS — BP 149/38 | HR 69 | Temp 97.6°F | Resp 18 | Ht 68.0 in | Wt 262.2 lb

## 2019-09-05 DIAGNOSIS — R350 Frequency of micturition: Secondary | ICD-10-CM | POA: Diagnosis not present

## 2019-09-05 DIAGNOSIS — R300 Dysuria: Secondary | ICD-10-CM

## 2019-09-05 LAB — POC URINALSYSI DIPSTICK (AUTOMATED)
Bilirubin, UA: NEGATIVE
Blood, UA: NEGATIVE
Glucose, UA: NEGATIVE
Ketones, UA: NEGATIVE
Leukocytes, UA: NEGATIVE
Nitrite, UA: NEGATIVE
Protein, UA: NEGATIVE
Spec Grav, UA: 1.01 (ref 1.010–1.025)
Urobilinogen, UA: 0.2 E.U./dL
pH, UA: 7 (ref 5.0–8.0)

## 2019-09-05 NOTE — Progress Notes (Addendum)
Subjective:    Patient ID: Veronica Bradford, female    DOB: Jul 07, 1945, 74 y.o.   MRN: [id number]  HPI  Pt came in for possible uti symptoms. Dr Veronica Bradford saw my chart message and send in bactrim.  My name is Dr Veronica Bradford I am helping out Dr Veronica Bradford while he is out of the office. Since you had a decent response to Bactrim DS I sent in a new 10 day course to get you through to your visit with Dr Veronica Bradford. Also recommend taking some AZO cranberry tabs and a probiotic daily. Drink plenty of water and let us know if your symptoms worsen. Take care Dr B   Pt in today reporting urinary symptoms for weeks. States had some symptoms since end of June. Pt took otc urinary test and + indicators. Pt tool old antibiotic and got better briefly but then afterwards symptoms came back. Then she went to UC and they gave keflex. Got better symptoms returned. She got evaluated again and culture indeterminate and was contaminated.   Later did another culture and got + for Klebsiella. She got bactrim for 3 days. That was on sensitivity report.   Over last 5 days.  Dysuria- yes Frequent urination-yes Hesitancy-no Suprapubic pressure-some Fever-no chills-no Nausea-no Vomiting-no CVA pain-no History of UTI-yes Gross hematuria-no  Review of Systems  Constitutional: Negative for chills, fatigue and fever.  Respiratory: Negative for cough, chest tightness and shortness of breath.   Cardiovascular: Negative for chest pain and palpitations.  Gastrointestinal: Negative for abdominal pain.  Genitourinary: Positive for dysuria and frequency. Negative for urgency.       Some slight burn and slight frequency.  Musculoskeletal: Negative for back pain and joint swelling.  Skin: Negative for rash.  Neurological: Negative for dizziness, numbness and headaches.  Hematological: Negative for adenopathy. Does not bruise/bleed easily.  Psychiatric/Behavioral: Negative for behavioral problems and confusion.    Past Medical  History:  Diagnosis Date  . Atrial fibrillation ([place])   . Back pain   . Chronic sinusitis    s/p 2 surgeries remotely, Dr Veronica Bradford  . Closed fracture of metatarsal of left foot    L foot, fifth metatarsal  . Ectopic pregnancy   . Endometrial cancer ([place]) 2004   Early diagnosis  . GERD (gastroesophageal reflux disease)   . Hyperlipemia   . Hypertension   . IBS (irritable bowel syndrome) 08/13/2014   Dx 2015, diarrhea on-off, Dr Veronica Bradford   . Lumbar radiculopathy 08/2012   MRI in 08/2012  . Mild depression ([place])    Pt's son has Down's Syndrome  . Mild hyperparathyroidism ([place])   . Osteopenia   . PAF (paroxysmal atrial fibrillation) ([place])   . Prediabetes   . Sciatica of right side 08/2013   Received Depomedrol 80 mg injection  . Sleep apnea 09/09/2013   Dx with OSA in 2015, by Dr. Caryl Bradford, APAP     Social History   Socioeconomic History  . Marital status: Married    Spouse name: Not on file  . Number of children: 1  . Years of education: Not on file  . Highest education level: Not on file  Occupational History  . 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 Smoking status: Former Smoker    Quit date: 01/06/1980    Years since quitting: 39.6  . Smokeless tobacco: Never Used  . Tobacco comment: smoked from 1970 to 1982, less than 1 ppd  Vaping Use  . Vaping Use: Never used  Substance and Sexual Activity  . Alcohol use: Yes    Comment: rare  . Drug use: No  . Sexual activity: Not Currently    Birth control/protection: None  Other Topics Concern  . Not on file  Social History Narrative   Lives w/ husband, and son Veronica Bradford   Retired Marine scientist   Lives in [place] Strain: [place]   . Difficulty of Paying Living Expenses: Not hard at all  Food Insecurity: No Food Insecurity  . Worried About Charity fundraiser in the Last Year: Never true  . Ran Out of Food in the Last Year: Never true  Transportation Needs: No  Transportation Needs  . Lack of Transportation (Medical): No  . Lack of Transportation (Non-Medical): No  Physical Activity:   . Days of Exercise per Week: Not on file  . Minutes of Exercise per Session: Not on file  Stress:   . Feeling of Stress : Not on file  Social Connections:   . Frequency of Communication with Friends and Family: Not on file  . Frequency of Social Gatherings with Friends and Family: Not on file  . Attends Religious Services: Not on file  . Active Member of Clubs or Organizations: Not on file  . Attends Archivist Meetings: Not on file  . Marital Status: Not on file  Intimate Partner Violence:   . Fear of Current or Ex-Partner: Not on file  . Emotionally Abused: Not on file  . Physically Abused: Not on file  . Sexually Abused: Not on file    Past Surgical History:  Procedure Laterality Date  . ABDOMINAL HYSTERECTOMY    . ATRIAL FIBRILLATION ABLATION N/A 09/04/2017   Procedure: ATRIAL FIBRILLATION ABLATION;  Surgeon: Veronica Grayer, MD;  Location: [facility];  Service: Cardiovascular;  Laterality: N/A;  . CARPAL TUNNEL RELEASE Right 06/17/2019  . CHOLECYSTECTOMY  ~2006  . DILATION AND CURETTAGE OF UTERUS     after SAB  . ESOPHAGOGASTRODUODENOSCOPY  02/09/2014   Dr. Cristina Bradford  . FINGER SURGERY Left    index  . NASAL SINUS SURGERY     x 2 remotely, Dr Veronica Bradford  . TONSILLECTOMY    . TOTAL ABDOMINAL HYSTERECTOMY W/ BILATERAL SALPINGOOPHORECTOMY    . TOTAL KNEE ARTHROPLASTY Right ~2008    Family History  Problem Relation Age of Onset  . Hypertension Mother   . Colon cancer Mother   . Breast cancer Mother   . Ovarian cancer Mother   . Hypertension Father   . Diabetes Father   . Head & neck cancer Sister   . CAD Neg Hx     Allergies  Allergen Reactions  . Nsaids Other (See Comments)    Pt on Xarelto  . Penicillins     Local reaction  . Tolmetin Other (See Comments)    Pt on Xarelto  . Zoster Vaccine Live Other (See Comments)     Sickness per Pt.   Marland Kitchen Amoxicillin Rash    Current Outpatient Medications on File Prior to Visit  Medication Sig Dispense Refill  . acetaminophen (TYLENOL) 325 MG tablet Take 325 mg by mouth as needed.    Marland Kitchen acetaminophen-codeine (TYLENOL #3) 300-30 MG tablet Take 1 tablet by mouth daily as needed for moderate pain. 30 tablet 0  . Chromium Picolinate 1000 MCG TABS Take 1,000 mcg by mouth every evening.    . Coenzyme Q10 (CO Q 10) 100 MG CAPS Take  100 mg by mouth daily.     . Cranberry 600 MG TABS     . diltiazem (CARDIZEM) 30 MG tablet Take 1 tablet every 4 hours AS NEEDED for heart rate >100 as long as blood pressure >100. 45 tablet 5  . flecainide (TAMBOCOR) 150 MG tablet Take 2 tablets (300 mg total) by mouth daily as needed (FOR AFIB). Pill in a pocket 60 tablet 5  . fluticasone (FLONASE) 50 MCG/ACT nasal spray Place 1 spray into both nostrils daily.     . furosemide (LASIX) 20 MG tablet Take 20 mg by mouth as needed for fluid.    Marland Kitchen loperamide (IMODIUM A-D) 2 MG tablet Take 2 mg by mouth daily.     . Magnesium 400 MG TABS Taking 400mg  of magnesium oxide by mouth in the am and the other magnesium tablet 250mg  at night    . Omega-3 Fatty Acids (FISH OIL) 1000 MG CAPS Take 1,000 mg by mouth daily.     Marland Kitchen omeprazole (PRILOSEC) 20 MG capsule Take 20 mg by mouth daily.    . pravastatin (PRAVACHOL) 40 MG tablet Take 1 tablet (40 mg total) by mouth at bedtime. 90 tablet 0  . Pyridoxine HCl (VITAMIN B6) 200 MG TABS     . rivaroxaban (XARELTO) 20 MG TABS tablet TAKE 1 TABLET BY MOUTH  DAILY WITH SUPPER 30 tablet 5  . sulfamethoxazole-trimethoprim (BACTRIM DS) 800-160 MG tablet Take 1 tablet by mouth 2 (two) times daily. 20 tablet 0  . valsartan (DIOVAN) 320 MG tablet Take 0.5 tablets (160 mg total) by mouth 2 (two) times daily. 90 tablet 2  . vitamin B-12 (CYANOCOBALAMIN) 1000 MCG tablet Take 2,000 mcg by mouth every evening.      No current facility-administered medications on file prior to visit.      BP (!) 149/38   Pulse 69   Temp 97.6 F (36.4 C) (Oral)   Resp 18   Ht 5\' 8"  (1.727 m)   Wt 262 lb 3.2 oz (118.9 kg)   SpO2 100%   BMI 39.87 kg/m       Objective:   Physical Exam  General- No acute distress. Pleasant patient. Neck- Full range of motion, no jvd Lungs- Clear, even and unlabored. Heart- regular rate and rhythm. Neurologic- CNII- XII grossly intact.  Back- no cva tenderness.       Assessment & Plan:  You do have suspicious signs/sympotms presently and history that supports likely uti.  Your urine looks clear but will follow your culture.  Start the bactrim DS Dr. Charlett Bradford sent in.  If studies come back negative then consider dx of IC.  Follow up 7 days or as needed.  Keep follow up with your urologist as well.

## 2019-09-05 NOTE — Patient Instructions (Signed)
You do have suspicious signs/sympotms presently and history that supports likely uti.  Your urine looks clear but will follow your culture.  Start the bactrim DS Dr. Charlett Blake sent in.  If studies come back negative then consider dx of IC.  Follow up 7 days or as needed.

## 2019-09-06 LAB — URINE CULTURE
MICRO NUMBER:: 10910846
Result:: NO GROWTH
SPECIMEN QUALITY:: ADEQUATE

## 2019-09-09 ENCOUNTER — Encounter: Payer: Self-pay | Admitting: Medical

## 2019-09-12 ENCOUNTER — Ambulatory Visit: Payer: Medicare Other | Admitting: Medical

## 2019-10-01 ENCOUNTER — Encounter: Payer: Self-pay | Admitting: Internal Medicine

## 2019-10-01 DIAGNOSIS — M8580 Other specified disorders of bone density and structure, unspecified site: Secondary | ICD-10-CM

## 2019-10-01 DIAGNOSIS — Z780 Asymptomatic menopausal state: Secondary | ICD-10-CM

## 2019-10-03 ENCOUNTER — Encounter (HOSPITAL_BASED_OUTPATIENT_CLINIC_OR_DEPARTMENT_OTHER): Payer: Medicare Other

## 2019-10-05 ENCOUNTER — Telehealth: Payer: Self-pay | Admitting: Internal Medicine

## 2019-10-05 NOTE — Telephone Encounter (Signed)
Paged by Ms. Hafford re atrial fibrillation. She reports that earlier this morning she could feel herself go back into atrial fibrillation. Her Fitbit clearly demonstrated an abrupt jump in her heart rate into the low 100s. She immediately took a diltiazem and two hours later took her flecainide. Normally she will convert, but she has yet to despite two additional doses of dilt. She has palpitations and fatigue, but no significant SOB or chest pain. Her HR remains in the low 100s but will go up to the 130s if she gets up and walks around. She is wondering whether she should come in this evening for a cardioversion. I advised her to trial an additional dose of dilt later this evening. I asked that she come to the ED if the palpitations become more bothersome or she has any significant change in her symptoms (SOB, chest pain, etc). She has not missed any doses of Xarelto. She will call tomorrow to report whether she has converted back to sinus.

## 2019-10-06 NOTE — Telephone Encounter (Signed)
Please follow-up with her to see if an AF clinic visit would be beneficial.

## 2019-10-09 ENCOUNTER — Encounter (HOSPITAL_COMMUNITY): Payer: Self-pay

## 2019-10-14 ENCOUNTER — Other Ambulatory Visit: Payer: Self-pay | Admitting: Internal Medicine

## 2019-10-14 NOTE — Telephone Encounter (Signed)
Lm to discuss 10/8 no return call.

## 2019-10-16 DIAGNOSIS — Z87448 Personal history of other diseases of urinary system: Secondary | ICD-10-CM | POA: Diagnosis not present

## 2019-10-16 DIAGNOSIS — Z87440 Personal history of urinary (tract) infections: Secondary | ICD-10-CM | POA: Diagnosis not present

## 2019-10-16 MED FILL — SOLIFENACIN SUCCINATE 10 MG: 10 | 30 days supply | Qty: 30 | Fill #0

## 2019-10-17 DIAGNOSIS — H43811 Vitreous degeneration, right eye: Secondary | ICD-10-CM | POA: Diagnosis not present

## 2019-10-17 DIAGNOSIS — H35371 Puckering of macula, right eye: Secondary | ICD-10-CM | POA: Diagnosis not present

## 2019-10-24 ENCOUNTER — Telehealth: Payer: Self-pay | Admitting: Pharmacist

## 2019-10-24 NOTE — Progress Notes (Addendum)
Chronic Care Management Pharmacy Assistant   Name: Veronica Bradford  MRN: [id number] DOB: Dec 08, 1945  Reason for Encounter: Medication Review/ General Adherence Call  Patient Questions:  1.  Have you seen any other providers since your last visit?   2.  Any changes in your medicines or health?     PCP : Colon Branch, MD  Allergies:   Allergies  Allergen Reactions   Nsaids Other (See Comments)    Pt on Xarelto   Penicillins     Local reaction   Tolmetin Other (See Comments)    Pt on Xarelto   Zoster Vaccine Live Other (See Comments)    Sickness per Pt.    Amoxicillin Rash    Medications: Outpatient Encounter Medications as of 10/24/2019  Medication Sig   acetaminophen (TYLENOL) 325 MG tablet Take 325 mg by mouth as needed.   acetaminophen-codeine (TYLENOL #3) 300-30 MG tablet Take 1 tablet by mouth daily as needed for moderate pain.   Chromium Picolinate 1000 MCG TABS Take 1,000 mcg by mouth every evening.   Coenzyme Q10 (CO Q 10) 100 MG CAPS Take 100 mg by mouth daily.    Cranberry 600 MG TABS    diltiazem (CARDIZEM) 30 MG tablet Take 1 tablet every 4 hours AS NEEDED for heart rate >100 as long as blood pressure >100.   flecainide (TAMBOCOR) 150 MG tablet Take 2 tablets (300 mg total) by mouth daily as needed (FOR AFIB). Pill in a pocket   fluticasone (FLONASE) 50 MCG/ACT nasal spray Place 1 spray into both nostrils daily.    furosemide (LASIX) 20 MG tablet Take 20 mg by mouth as needed for fluid.   loperamide (IMODIUM A-D) 2 MG tablet Take 2 mg by mouth daily.    Magnesium 400 MG TABS Taking 400mg  of magnesium oxide by mouth in the am and the other magnesium tablet 250mg  at night   Omega-3 Fatty Acids (FISH OIL) 1000 MG CAPS Take 1,000 mg by mouth daily.    omeprazole (PRILOSEC) 20 MG capsule Take 20 mg by mouth daily.   pravastatin (PRAVACHOL) 40 MG tablet Take 1 tablet (40 mg total) by mouth at bedtime.   Pyridoxine HCl (VITAMIN B6) 200 MG TABS    rivaroxaban  (XARELTO) 20 MG TABS tablet TAKE 1 TABLET BY MOUTH  DAILY WITH SUPPER   sulfamethoxazole-trimethoprim (BACTRIM DS) 800-160 MG tablet Take 1 tablet by mouth 2 (two) times daily.   valsartan (DIOVAN) 320 MG tablet Take 0.5 tablets (160 mg total) by mouth 2 (two) times daily.   vitamin B-12 (CYANOCOBALAMIN) 1000 MCG tablet Take 2,000 mcg by mouth every evening.    No facility-administered encounter medications on file as of 10/24/2019.    Current Diagnosis: Patient Active Problem List   Diagnosis Date Noted   Carpal tunnel syndrome, right 07/08/2019   Vitamin D deficiency 01/20/2019   Retinal edema 11/18/2018   Macular pucker, right eye 11/18/2018   Multiple pulmonary nodules determined by computed tomography of lung 12/06/2017   Paroxysmal atrial fibrillation ([place]) 09/04/2017   Osteopenia 05/23/2017   Annual physical exam 04/14/2015   PCP NOTES >>>>> 12/23/2014   IBS (irritable bowel syndrome) 08/13/2014   Hyperglycemia    Chronic sinusitis    Endometrial cancer ([facility])    Back pain-- on tylenol #3 prn    GERD (gastroesophageal reflux disease)    HTN (hypertension) 85/46/2703   Diastolic dysfunction-grade 2 09/09/2013   Chronic anticoagulation 09/09/2013   Edema of both legs  09/09/2013   Dyslipidemia 09/09/2013   Obesity (BMI 30-39.9) 09/09/2013   Sleep apnea-- Dr Maxwell Caul, on CPAP 09/09/2013    Goals Addressed   None    Called patient and discussed Pravastatin 40 mg 90-day supply medication, no problems at this time.  Patient was unaware of the Chronic Care Management program.  Explained the concept of CCM and our goals on working with patients. Patient states she has been taking this medication for years now.   Denies any side effects. Patient denies any recent ED visits.  Patient states she has no problems obtaining her medication.  States no pharmacy problems.   Follow-Up:  Pharmacist Review    [place] Shannon, [facility] [place]  Pharmacist Assistant [phone]  Reviewed by: De Blanch, PharmD Clinical Pharmacist [facility] [phone]

## 2019-10-29 ENCOUNTER — Other Ambulatory Visit: Payer: Self-pay

## 2019-10-29 ENCOUNTER — Ambulatory Visit (HOSPITAL_COMMUNITY)
Admission: RE | Admit: 2019-10-29 | Discharge: 2019-10-29 | Disposition: A | Discharge status: Home / Self Care | Payer: Medicare Other | Source: Ambulatory Visit | Attending: Nurse Practitioner | Admitting: Nurse Practitioner

## 2019-10-29 ENCOUNTER — Encounter (HOSPITAL_COMMUNITY): Payer: Self-pay | Admitting: Nurse Practitioner

## 2019-10-29 VITALS — BP 116/68 | HR 55 | Ht 68.0 in | Wt 259.2 lb

## 2019-10-29 DIAGNOSIS — Z87891 Personal history of nicotine dependence: Secondary | ICD-10-CM | POA: Diagnosis not present

## 2019-10-29 DIAGNOSIS — D6869 Other thrombophilia: Secondary | ICD-10-CM | POA: Diagnosis not present

## 2019-10-29 DIAGNOSIS — I480 Paroxysmal atrial fibrillation: Secondary | ICD-10-CM | POA: Insufficient documentation

## 2019-10-29 DIAGNOSIS — I493 Ventricular premature depolarization: Secondary | ICD-10-CM | POA: Diagnosis not present

## 2019-10-29 DIAGNOSIS — I4510 Unspecified right bundle-branch block: Secondary | ICD-10-CM | POA: Diagnosis not present

## 2019-10-29 DIAGNOSIS — Z7901 Long term (current) use of anticoagulants: Secondary | ICD-10-CM | POA: Diagnosis not present

## 2019-10-29 LAB — CBC
HCT: 41.1 % (ref 36.0–46.0)
Hemoglobin: 12.6 g/dL (ref 12.0–15.0)
MCH: 27.5 pg (ref 26.0–34.0)
MCHC: 30.7 g/dL (ref 30.0–36.0)
MCV: 89.5 fL (ref 80.0–100.0)
Platelets: 229 10*3/uL (ref 150–400)
RBC: 4.59 MIL/uL (ref 3.87–5.11)
RDW: 17.2 % — ABNORMAL HIGH (ref 11.5–15.5)
WBC: 7.5 10*3/uL (ref 4.0–10.5)
nRBC: 0 % (ref 0.0–0.2)

## 2019-10-29 LAB — TSH: TSH: 0.719 u[IU]/mL (ref 0.350–4.500)

## 2019-10-29 LAB — COMPREHENSIVE METABOLIC PANEL
ALT: 23 U/L (ref 0–44)
AST: 23 U/L (ref 15–41)
Albumin: 3.5 g/dL (ref 3.5–5.0)
Alkaline Phosphatase: 61 U/L (ref 38–126)
Anion gap: 6 (ref 5–15)
BUN: 10 mg/dL (ref 8–23)
CO2: 27 mmol/L (ref 22–32)
Calcium: 10.1 mg/dL (ref 8.9–10.3)
Chloride: 109 mmol/L (ref 98–111)
Creatinine, Ser: 0.69 mg/dL (ref 0.44–1.00)
GFR, Estimated: >60 mL/min (ref >60)
Glucose, Bld: 113 mg/dL — ABNORMAL HIGH (ref 70–99)
Potassium: 3.6 mmol/L (ref 3.5–5.1)
Sodium: 142 mmol/L (ref 135–145)
Total Bilirubin: 0.6 mg/dL (ref 0.3–1.2)
Total Protein: 6.2 g/dL — ABNORMAL LOW (ref 6.5–8.1)

## 2019-10-29 LAB — MAGNESIUM: Magnesium: 2.2 mg/dL (ref 1.7–2.4)

## 2019-10-29 NOTE — Progress Notes (Signed)
Primary Care Physician: Colon Branch, MD Referring Physician: Dr. Rayann Heman EP: Dr. Ludger Nutting is a 74 y.o. female with a h/o afib and PVC's that is in the afib clinic for increased palpitations 2/2 PVC's. She has them documented on her Jodelle Red and her ekg today shows sinus brady with PVC's as well. She feels they contribute to a "brain fog" when she has more of them. She has a HR in the 50's without any rate control on board, so I do not feel comfortable adding a BB for PVC's .Marland Kitchen In the past, a very small dose of BB caused HR's in the 40's. In the past Dr. Caryl Comes recommended magnesium for them which helped for a while. She is currently still taking this bid. She denies any extra caffeine or alcohol. No extra stress. She was in the hospital in July with chest pain and afib, wore a monitor for a short while but no significant PVC burden was noted. She feels the PVC's have been worse for the last week. She does use flecainide PIP for breakthrough afib , but I do not feel comfortable using this daily for PVC's 2/2 baseline RBBB.  afib has been quiet.    Today, she + for symptoms of palpitations, no  chest pain, shortness of breath, orthopnea, PND, lower extremity edema, dizziness, presyncope, syncope, or neurologic sequela.+ for brain fog. The patient is tolerating medications without difficulties and is otherwise without complaint today.   Past Medical History:  Diagnosis Date  . Atrial fibrillation ([place])   . Back pain   . Chronic sinusitis    s/p 2 surgeries remotely, [name]  . Closed fracture of metatarsal of left foot    L foot, fifth metatarsal  . Ectopic pregnancy   . Endometrial cancer ([place]) 2004   Early diagnosis  . GERD (gastroesophageal reflux disease)   . Hyperlipemia   . Hypertension   . IBS (irritable bowel syndrome) 08/13/2014   Dx 2015, diarrhea on-off, Dr [name]   . Lumbar radiculopathy 08/2012   MRI in 08/2012  . Mild depression ([place])    Pt's son has Down's  Syndrome  . Mild hyperparathyroidism ([place])   . Osteopenia   . PAF (paroxysmal atrial fibrillation) ([place])   . Prediabetes   . Sciatica of right side 08/2013   Received Depomedrol 80 mg injection  . Sleep apnea 09/09/2013   Dx with OSA in 2015, by Dr. Caryl Comes, APAP   Past Surgical History:  Procedure Laterality Date  . ABDOMINAL HYSTERECTOMY    . ATRIAL FIBRILLATION ABLATION N/A 09/04/2017   Procedure: ATRIAL FIBRILLATION ABLATION;  Surgeon: [name], MD;  Location: [facility];  Service: Cardiovascular;  Laterality: N/A;  . CARPAL TUNNEL RELEASE Right 06/17/2019  . CHOLECYSTECTOMY  ~2006  . DILATION AND CURETTAGE OF UTERUS     after SAB  . ESOPHAGOGASTRODUODENOSCOPY  02/09/2014   Dr. [name]  . FINGER SURGERY Left    index  . NASAL SINUS SURGERY     x 2 remotely, [name]  . TONSILLECTOMY    . TOTAL ABDOMINAL HYSTERECTOMY W/ BILATERAL SALPINGOOPHORECTOMY    . TOTAL KNEE ARTHROPLASTY Right ~2008    Current Outpatient Medications  Medication Sig Dispense Refill  . acetaminophen (TYLENOL) 325 MG tablet Take 325 mg by mouth as needed.    Marland Kitchen acetaminophen-codeine (TYLENOL #3) 300-30 MG tablet Take 1 tablet by mouth daily as needed for moderate pain. 30 tablet 0  . Chromium  Picolinate 1000 MCG TABS Take 1,000 mcg by mouth every evening.    . Coenzyme Q10 (CO Q 10) 100 MG CAPS Take 100 mg by mouth daily.     . Cranberry 600 MG TABS     . diltiazem (CARDIZEM) 30 MG tablet Take 1 tablet every 4 hours AS NEEDED for heart rate >100 as long as blood pressure >100. 45 tablet 5  . flecainide (TAMBOCOR) 150 MG tablet Take 2 tablets (300 mg total) by mouth daily as needed (FOR AFIB). Pill in a pocket 60 tablet 5  . fluticasone (FLONASE) 50 MCG/ACT nasal spray Place 1 spray into both nostrils daily.     . furosemide (LASIX) 20 MG tablet Take 20 mg by mouth as needed for fluid.    . Magnesium 400 MG TABS Taking 400mg  of magnesium oxide by mouth in the am and the other magnesium tablet 250mg   at night    . Omega-3 Fatty Acids (FISH OIL) 1000 MG CAPS Take 1,000 mg by mouth daily.     Marland Kitchen omeprazole (PRILOSEC) 20 MG capsule Take 20 mg by mouth daily.    . pravastatin (PRAVACHOL) 40 MG tablet Take 1 tablet (40 mg total) by mouth at bedtime. 90 tablet 1  . Pyridoxine HCl (VITAMIN B6) 200 MG TABS     . rivaroxaban (XARELTO) 20 MG TABS tablet TAKE 1 TABLET BY MOUTH  DAILY WITH SUPPER 30 tablet 5  . valsartan (DIOVAN) 320 MG tablet Take 0.5 tablets (160 mg total) by mouth 2 (two) times daily. 90 tablet 2  . vitamin B-12 (CYANOCOBALAMIN) 1000 MCG tablet Take 2,000 mcg by mouth every evening.     . loperamide (IMODIUM A-D) 2 MG tablet Take 2 mg by mouth daily.     Marland Kitchen sulfamethoxazole-trimethoprim (BACTRIM DS) 800-160 MG tablet Take 1 tablet by mouth 2 (two) times daily. (Patient not taking: Reported on 10/29/2019) 20 tablet 0   No current facility-administered medications for this encounter.    Allergies  Allergen Reactions  . Nsaids Other (See Comments)    Pt on Xarelto  . Penicillins     Local reaction  . Tolmetin Other (See Comments)    Pt on Xarelto  . Zoster Vaccine Live Other (See Comments)    Sickness per Pt.   Marland Kitchen Amoxicillin Rash    Social History   Socioeconomic History  . Marital status: Married    Spouse name: Not on file  . Number of children: 1  . Years of education: Not on file  . Highest education level: Not on file  Occupational History  . 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 Smoking status: Former Smoker    Quit date: 01/06/1980    Years since quitting: 39.8  . Smokeless tobacco: Never Used  . Tobacco comment: smoked from 1970 to 1982, less than 1 ppd  Vaping Use  . Vaping Use: Never used  Substance and Sexual Activity  . Alcohol use: Yes    Comment: rare  . Drug use: No  . Sexual activity: Not Currently    Birth control/protection: None  Other Topics Concern  . Not on file  Social History Narrative   Lives w/ husband, and son [name]    Retired Marine scientist   Lives in [place] Strain: [place]   . Difficulty of Paying Living Expenses: Not hard at all  Food Insecurity: No Food Insecurity  . Worried About [employer] of  Food in the Last Year: Never true  . Ran Out of Food in the Last Year: Never true  Transportation Needs: No Transportation Needs  . Lack of Transportation (Medical): No  . Lack of Transportation (Non-Medical): No  Physical Activity:   . Days of Exercise per Week: Not on file  . Minutes of Exercise per Session: Not on file  Stress:   . Feeling of Stress : Not on file  Social Connections:   . Frequency of Communication with Friends and Family: Not on file  . Frequency of Social Gatherings with Friends and Family: Not on file  . Attends Religious Services: Not on file  . Active Member of Clubs or Organizations: Not on file  . Attends Archivist Meetings: Not on file  . Marital Status: Not on file  Intimate Partner Violence:   . Fear of Current or Ex-Partner: Not on file  . Emotionally Abused: Not on file  . Physically Abused: Not on file  . Sexually Abused: Not on file    Family History  Problem Relation Age of Onset  . Hypertension Mother   . Colon cancer Mother   . Breast cancer Mother   . Ovarian cancer Mother   . Hypertension Father   . Diabetes Father   . Head & neck cancer Sister   . CAD Neg Hx     ROS- All systems are reviewed and negative except as per the HPI above  Physical Exam: Vitals:   10/29/19 1539  BP: 116/68  Pulse: (!) 55  Weight: 117.6 kg  Height: 5\' 8"  (1.727 m)   Wt Readings from Last 3 Encounters:  10/29/19 117.6 kg  09/05/19 118.9 kg  09/01/19 114.8 kg    Labs: Lab Results  Component Value Date   NA 140 07/24/2019   K 3.7 07/24/2019   CL 106 07/24/2019   CO2 27 07/24/2019   GLUCOSE 125 (H) 07/24/2019   BUN 19 07/24/2019   CREATININE 0.67 07/24/2019   CALCIUM 9.8 07/24/2019   MG 2.1  07/24/2019   Lab Results  Component Value Date   INR 2.5 (H) 11/08/2006   Lab Results  Component Value Date   CHOL 177 05/31/2018   HDL 61.70 05/31/2018   [place] 99 05/31/2018   TRIG 81.0 05/31/2018     GEN- The patient is well appearing, alert and oriented x 3 today.   Head- normocephalic, atraumatic Eyes-  Sclera clear, conjunctiva pink Ears- hearing intact Oropharynx- clear Neck- supple, no JVP Lymph- no cervical lymphadenopathy Lungs- Clear to ausculation bilaterally, normal work of breathing Heart- regular rate and irregular  Rhythm(pvc's), no murmurs, rubs or gallops, PMI not laterally displaced GI- soft, NT, ND, + BS Extremities- no clubbing, cyanosis, or edema MS- no significant deformity or atrophy Skin- no rash or lesion Psych- euthymic mood, full affect Neuro- strength and sensation are intact  EKG-sinus brady at 55 bpm, with frequent PVC's RBBB 08/08/19-1. Left ventricular ejection fraction, by estimation, is 60 to 65%. The left ventricle has normal function. The left ventricle has no regional wall motion abnormalities. Left ventricular diastolic parameters are consistent with Grade II diastolic dysfunction (pseudonormalization). Elevated left ventricular end-diastolic pressure. 2. Right ventricular systolic function is normal. The right ventricular size is normal. There is normal pulmonary artery systolic pressure. 3. Left atrial size was severely dilated. 4. The mitral valve is normal in structure. Trivial mitral valve regurgitation. No evidence of mitral stenosis. 5. The aortic valve is tricuspid. Aortic valve regurgitation is  not visualized. No aortic stenosis is present. 6. The inferior vena cava is normal in size with greater than 50% respiratory variability, suggesting right atrial pressure of 3 mmHg.   Assessment and Plan: 1. Frequent PVC's Avoid caffeine She does not consume alcohol Continue magnesium as prescribed She was reassured that PVC's  are usually benign Will place a one week ZIO patch to assess PVC burden I am not prescribing BB for presence of bradycardia or daily flecainide for RBBB Cbc, Tsh, Cmet, Mag today  2. Paroxysmal afib  This has recently been quiet    3. CHA2DS2VASc score of 3 Continue  xarelto 20 mg daily   She will f/u with Dr. Caryl Comes after zio patch   Geroge Baseman. [place], [facility] [address] [place],  [address] [phone]

## 2019-10-29 NOTE — Patient Instructions (Signed)
Remove monitor on 11/4 and mail in  Dr. Jackalyn [place] office will call to schedule appointment for follow up to review monitor results.

## 2019-11-03 ENCOUNTER — Telehealth (HOSPITAL_COMMUNITY): Payer: Self-pay

## 2019-11-03 ENCOUNTER — Telehealth: Payer: Self-pay | Admitting: Internal Medicine

## 2019-11-03 NOTE — Telephone Encounter (Signed)
Contacted patient to notify her Per Dr. Caryl Comes there is no harm in proceeding with her Colonoscopy. Her PVCS seem to be a little better and Roderic Palau NP was notified. Consulted with patient and she verbalized understanding.

## 2019-11-03 NOTE — Telephone Encounter (Signed)
Pt called in stated she seen Dr Rayann Heman last wed on the 247th and she was suppose to get a call to be referred to [name].  She stated she has not rec'd a call , she just wanted to fu   Best number [phone]

## 2019-11-03 NOTE — Telephone Encounter (Signed)
New message    Pt has questions about the heart monitor she is wearing. She would like to speak to Dr. Aquilla Hacker nurse about pvc and if it would interfere with her having a colonoscopy

## 2019-11-10 DIAGNOSIS — Z1159 Encounter for screening for other viral diseases: Secondary | ICD-10-CM | POA: Diagnosis not present

## 2019-11-13 ENCOUNTER — Encounter: Payer: Self-pay | Admitting: Internal Medicine

## 2019-11-13 DIAGNOSIS — K319 Disease of stomach and duodenum, unspecified: Secondary | ICD-10-CM | POA: Diagnosis not present

## 2019-11-13 DIAGNOSIS — K317 Polyp of stomach and duodenum: Secondary | ICD-10-CM | POA: Diagnosis not present

## 2019-11-13 DIAGNOSIS — K3189 Other diseases of stomach and duodenum: Secondary | ICD-10-CM | POA: Diagnosis not present

## 2019-11-13 DIAGNOSIS — Z86010 Personal history of colonic polyps: Secondary | ICD-10-CM | POA: Diagnosis not present

## 2019-11-13 DIAGNOSIS — D500 Iron deficiency anemia secondary to blood loss (chronic): Secondary | ICD-10-CM | POA: Diagnosis not present

## 2019-11-13 LAB — HM COLONOSCOPY

## 2019-11-15 DIAGNOSIS — R002 Palpitations: Secondary | ICD-10-CM | POA: Diagnosis not present

## 2019-11-17 ENCOUNTER — Encounter (HOSPITAL_COMMUNITY): Payer: Self-pay | Admitting: *Deleted

## 2019-11-17 ENCOUNTER — Other Ambulatory Visit: Payer: Self-pay

## 2019-11-17 ENCOUNTER — Encounter (HOSPITAL_COMMUNITY): Payer: Self-pay

## 2019-11-17 ENCOUNTER — Ambulatory Visit (HOSPITAL_BASED_OUTPATIENT_CLINIC_OR_DEPARTMENT_OTHER)
Admission: RE | Admit: 2019-11-17 | Discharge: 2019-11-17 | Disposition: A | Discharge status: Home / Self Care | Payer: Medicare Other | Source: Ambulatory Visit

## 2019-11-17 ENCOUNTER — Ambulatory Visit (HOSPITAL_BASED_OUTPATIENT_CLINIC_OR_DEPARTMENT_OTHER)
Admission: RE | Admit: 2019-11-17 | Discharge: 2019-11-17 | Disposition: A | Discharge status: Home / Self Care | Payer: Medicare Other | Source: Ambulatory Visit | Attending: Internal Medicine | Admitting: Internal Medicine

## 2019-11-17 ENCOUNTER — Encounter (HOSPITAL_BASED_OUTPATIENT_CLINIC_OR_DEPARTMENT_OTHER): Payer: Self-pay

## 2019-11-17 DIAGNOSIS — Z780 Asymptomatic menopausal state: Secondary | ICD-10-CM | POA: Insufficient documentation

## 2019-11-17 DIAGNOSIS — Z13820 Encounter for screening for osteoporosis: Secondary | ICD-10-CM | POA: Diagnosis not present

## 2019-11-17 DIAGNOSIS — Z1231 Encounter for screening mammogram for malignant neoplasm of breast: Secondary | ICD-10-CM | POA: Diagnosis not present

## 2019-11-17 DIAGNOSIS — M8580 Other specified disorders of bone density and structure, unspecified site: Secondary | ICD-10-CM | POA: Insufficient documentation

## 2019-11-17 DIAGNOSIS — M85852 Other specified disorders of bone density and structure, left thigh: Secondary | ICD-10-CM | POA: Diagnosis not present

## 2019-11-17 NOTE — Addendum Note (Signed)
Encounter addended by: Juluis Mire, RN on: 11/17/2019 8:43 AM  Actions taken: Imaging Exam ended

## 2019-11-19 DIAGNOSIS — K319 Disease of stomach and duodenum, unspecified: Secondary | ICD-10-CM | POA: Diagnosis not present

## 2019-11-19 DIAGNOSIS — K317 Polyp of stomach and duodenum: Secondary | ICD-10-CM | POA: Diagnosis not present

## 2019-11-20 ENCOUNTER — Encounter: Payer: Self-pay | Admitting: Internal Medicine

## 2019-11-21 ENCOUNTER — Other Ambulatory Visit: Payer: Self-pay

## 2019-11-21 ENCOUNTER — Encounter: Payer: Self-pay | Admitting: Internal Medicine

## 2019-11-21 ENCOUNTER — Ambulatory Visit: Payer: Medicare Other | Admitting: Internal Medicine

## 2019-11-21 VITALS — BP 118/64 | HR 60 | Ht 67.5 in | Wt 254.2 lb

## 2019-11-21 DIAGNOSIS — I480 Paroxysmal atrial fibrillation: Secondary | ICD-10-CM | POA: Diagnosis not present

## 2019-11-21 MED ORDER — FLECAINIDE ACETATE 50 MG PO TABS: 50.0000 mg | ORAL_TABLET | Freq: Two times a day (BID) | ORAL | 3 refills | Status: DC

## 2019-11-21 NOTE — Patient Instructions (Signed)
Medication Instructions:  Your physician has recommended you make the following change in your medication:  Start Flecainide 50mg  1 tablet by mouth twice daily.  *If you need a refill on your cardiac medications before your next appointment, please call your pharmacy*   Lab Work: None ordered.  If you have labs (blood work) drawn today and your tests are completely normal, you will receive your results only by: Marland Kitchen MyChart Message (if you have MyChart) OR . A paper copy in the mail If you have any lab test that is abnormal or we need to change your treatment, we will call you to review the results.   Testing/Procedures: EKg to be scheduled next week.   Follow-Up: At [facility], you and your health needs are our priority.  As part of our continuing mission to provide you with exceptional heart care, we have created designated Provider Care Teams.  These Care Teams include your primary Cardiologist (physician) and Advanced Practice Providers (APPs -  Physician Assistants and Nurse Practitioners) who all work together to provide you with the care you need, when you need it.  We recommend signing up for the patient portal called "MyChart".  Sign up information is provided on this After Visit Summary.  MyChart is used to connect with patients for Virtual Visits (Telemedicine).  Patients are able to view lab/test results, encounter notes, upcoming appointments, etc.  Non-urgent messages can be sent to your provider as well.   To learn more about what you can do with MyChart, go to [email or web address].    Your next appointment:   02/05/2020 at 145pm - Virtual visit with [name]

## 2019-11-21 NOTE — Progress Notes (Signed)
Patient Care Team: Colon Branch, MD as PCP - General (Internal Medicine) Marius Ditch, MD as Consulting Physician (Internal Medicine) [name], MD as Consulting Physician (Cardiology) Wylene Simmer, MD as Consulting Physician (Orthopedic Surgery) Armandina Gemma, MD as Consulting Physician (General Surgery) [name], MD as Consulting Physician (Gastroenterology) [place], [place], [place] as Consulting Physician (Optometry) Sherlynn Stalls, MD as Consulting Physician (Ophthalmology) Roseanne Kaufman, MD as Consulting Physician (Orthopedic Surgery)   HPI  Veronica Bradford is a 74 y.o. female Seen in followup for AFib and now PVCs. Event recorder demonstrated 0-14% PVCs (monomorphic and  nonsustained ventricular tachycardia. Afib ablation (9/19) JA--her impression is that her atrial fibrillation frequency is not much different.  She does not need to get cardioverted however.  Atrial fibrillation seems to be associated in some way with her IBS which is associated with diarrhea.  She has treated sleep apnea with good reports.   anticoagulation with Rivaroxaban and without bleeding issues  PVCs has been variably improved with supplemental magnesium;   more problematic recently with "brain fog.  "  Longstanding bradycardia.  Using her cardia as well as her recent Zio patch she does get her heart rate into the low 100s with walking the dog.    Date Cr Hgb  K Mag  5/18 0.66 14.7 3.7 2.1  10/21  0.69 12.6 3.6 2.2 6       DATE TEST EF   9/15 Myoview  65 % No ischemia  8/17 Echo   60-65 %   8/21 Myoview 71% No ischemia  8/21 Echo  60-65% LAE-mod-severe  (42.5 ml/m2)         Monitor 8/21>> scant Afib ( 2 hrs) and rare PVCs  Monitor 11/21 >> AFib none   PVCs 6 %      Past Medical History:  Diagnosis Date  . Atrial fibrillation ([place])   . Back pain   . Chronic sinusitis    s/p 2 surgeries remotely, [name]  . Closed fracture of metatarsal of left foot    L foot, fifth  metatarsal  . Ectopic pregnancy   . Endometrial cancer ([place]) 2004   Early diagnosis  . GERD (gastroesophageal reflux disease)   . Hyperlipemia   . Hypertension   . IBS (irritable bowel syndrome) 08/13/2014   Dx 2015, diarrhea on-off, Dr [name]   . Lumbar radiculopathy 08/2012   MRI in 08/2012  . Mild depression ([place])    Pt's son has Down's Syndrome  . Mild hyperparathyroidism ([place])   . Osteopenia   . PAF (paroxysmal atrial fibrillation) ([place])   . Prediabetes   . Sciatica of right side 08/2013   Received Depomedrol 80 mg injection  . Sleep apnea 09/09/2013   Dx with OSA in 2015, by Dr. Caryl Comes, APAP    Past Surgical History:  Procedure Laterality Date  . ABDOMINAL HYSTERECTOMY    . ATRIAL FIBRILLATION ABLATION N/A 09/04/2017   Procedure: ATRIAL FIBRILLATION ABLATION;  Surgeon: [name], MD;  Location: [facility];  Service: Cardiovascular;  Laterality: N/A;  . CARPAL TUNNEL RELEASE Right 06/17/2019  . CHOLECYSTECTOMY  ~2006  . DILATION AND CURETTAGE OF UTERUS     after SAB  . ESOPHAGOGASTRODUODENOSCOPY  02/09/2014   Dr. [name]  . FINGER SURGERY Left    index  . NASAL SINUS SURGERY     x 2 remotely, [name]  . TONSILLECTOMY    . TOTAL ABDOMINAL HYSTERECTOMY W/ BILATERAL SALPINGOOPHORECTOMY    .  TOTAL KNEE ARTHROPLASTY Right ~2008    Current Outpatient Medications  Medication Sig Dispense Refill  . acetaminophen (TYLENOL) 325 MG tablet Take 325 mg by mouth as needed.    Marland Kitchen acetaminophen-codeine (TYLENOL #3) 300-30 MG tablet Take 1 tablet by mouth daily as needed for moderate pain. 30 tablet 0  . Chromium Picolinate 1000 MCG TABS Take 1,000 mcg by mouth every evening.    . Coenzyme Q10 (CO Q 10) 100 MG CAPS Take 100 mg by mouth daily.     . Cranberry 600 MG TABS     . diltiazem (CARDIZEM) 30 MG tablet Take 1 tablet every 4 hours AS NEEDED for heart rate >100 as long as blood pressure >100. 45 tablet 5  . flecainide (TAMBOCOR) 150 MG tablet Take 2 tablets (300 mg  total) by mouth daily as needed (FOR AFIB). Pill in a pocket 60 tablet 5  . fluticasone (FLONASE) 50 MCG/ACT nasal spray Place 1 spray into both nostrils daily.     . furosemide (LASIX) 20 MG tablet Take 20 mg by mouth as needed for fluid.    Marland Kitchen loperamide (IMODIUM A-D) 2 MG tablet Take 2 mg by mouth daily.     . Magnesium 400 MG TABS Take 400 mg by mouth in the morning and at bedtime.     . Omega-3 Fatty Acids (FISH OIL) 1000 MG CAPS Take 1,000 mg by mouth daily.     Marland Kitchen omeprazole (PRILOSEC) 20 MG capsule Take 20 mg by mouth daily.    . pravastatin (PRAVACHOL) 40 MG tablet Take 1 tablet (40 mg total) by mouth at bedtime. 90 tablet 1  . Pyridoxine HCl (VITAMIN B6) 200 MG TABS     . rivaroxaban (XARELTO) 20 MG TABS tablet TAKE 1 TABLET BY MOUTH  DAILY WITH SUPPER 30 tablet 5  . sulfamethoxazole-trimethoprim (BACTRIM DS) 800-160 MG tablet Take 1 tablet by mouth 2 (two) times daily. 20 tablet 0  . valsartan (DIOVAN) 320 MG tablet Take 320 mg by mouth daily.    . vitamin B-12 (CYANOCOBALAMIN) 1000 MCG tablet Take 2,000 mcg by mouth every evening.      No current facility-administered medications for this visit.    Allergies  Allergen Reactions  . Nsaids Other (See Comments)    Pt on Xarelto  . Penicillins     Local reaction  . Tolmetin Other (See Comments)    Pt on Xarelto  . Zoster Vaccine Live Other (See Comments)    Sickness per Pt.   Marland Kitchen Amoxicillin Rash    Review of Systems negative except from HPI and PMH  Physical Exam BP 118/64   Pulse 60   Ht 5' 7.5" (1.715 m)   Wt 254 lb 3.2 oz (115.3 kg)   SpO2 94%   BMI 39.23 kg/m  Well developed and nourished in no acute distress HENT normal Neck supple with JVP-flat Clear Regular rate and rhythm, no murmurs or gallops Abd-soft with active BS No Clubbing cyanosis edema Skin-warm and dry A & Oriented  Grossly normal sensory and motor function  ECG  Sinus @ 60  20/16/46 RBBB  Assessment and  Plan  Atrial  fibrillation-paroxysmal   CHADS-VASc score 2 (age/gender)    Obesity  RBBB  PVCs  Sinus bradycardia  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    The ECG 12/15 showed IVCD @ 140    On anticoagulation without bleeding.  Discussed treatment options related to her A. fib as well as her PVCs which  would include #1-doing nothing,   2-antiarrhythmic therapy,   3-ablative therapy.  As relates to the latter and her A. fib her left atrial size becomes an issue. Moreover, there were concerns about hospitalization given the Covid pandemic and so antiarrhythmic options also would be excluding of dofetilide.  Do not think amiodarone is a great choice with his propensity to bradycardia which would be significantly aggravated.  This would also apply to dronaderone.  Hence, we have landed on flecainide and we have had a lengthy discussion regarding the potential for acceleration of her atrial arrhythmias in the event that she developed atrial flutter without concomitant AV nodal blockade.  She does have Cardizem at home and she would use it as needed.  With her conduction system disease, we will have to play close attention to lengthening of PR/QRS duration.

## 2019-11-25 MED ORDER — VALSARTAN 320 MG PO TABS: 320.0000 mg | ORAL_TABLET | Freq: Every day | ORAL | 0 refills | Status: DC

## 2019-12-01 ENCOUNTER — Other Ambulatory Visit: Payer: Self-pay

## 2019-12-01 ENCOUNTER — Ambulatory Visit (INDEPENDENT_AMBULATORY_CARE_PROVIDER_SITE_OTHER): Payer: Medicare Other

## 2019-12-01 VITALS — BP 159/75 | HR 76 | Wt 256.2 lb

## 2019-12-01 DIAGNOSIS — I480 Paroxysmal atrial fibrillation: Secondary | ICD-10-CM | POA: Diagnosis not present

## 2019-12-01 DIAGNOSIS — I493 Ventricular premature depolarization: Secondary | ICD-10-CM | POA: Diagnosis not present

## 2019-12-01 DIAGNOSIS — Z79899 Other long term (current) drug therapy: Secondary | ICD-10-CM

## 2019-12-01 NOTE — Progress Notes (Signed)
Pt presents for nurse visit today for EKG due to Flecainide start at the request of [name].  Pt started Flecainide 50mg  bid on 11/27/2019 for PVC's and A-Fib.  Pt reports she has been feeling well since starting the Flecainide and does not believe she has had any PVC's since starting medication.  Pt did report she possibly felt PVC's while having EKG today.  EKG reviewed by Dr Leda Quail who states it is unchanged from last EKG and to continue Flecainide as prescribed.  Message routed to [name] for review.

## 2019-12-01 NOTE — Patient Instructions (Signed)
Medication Instructions:  Your physician recommends that you continue on your current medications as directed. Please refer to the Current Medication list given to you today.  *If you need a refill on your cardiac medications before your next appointment, please call your pharmacy*   Lab Work: None ordered.  If you have labs (blood work) drawn today and your tests are completely normal, you will receive your results only by: Marland Kitchen MyChart Message (if you have MyChart) OR . A paper copy in the mail If you have any lab test that is abnormal or we need to change your treatment, we will call you to review the results.   Testing/Procedures: EKG today   Follow-Up: At [facility], you and your health needs are our priority.  As part of our continuing mission to provide you with exceptional heart care, we have created designated Provider Care Teams.  These Care Teams include your primary Cardiologist (physician) and Advanced Practice Providers (APPs -  Physician Assistants and Nurse Practitioners) who all work together to provide you with the care you need, when you need it.  We recommend signing up for the patient portal called "MyChart".  Sign up information is provided on this After Visit Summary.  MyChart is used to connect with patients for Virtual Visits (Telemedicine).  Patients are able to view lab/test results, encounter notes, upcoming appointments, etc.  Non-urgent messages can be sent to your provider as well.   To learn more about what you can do with MyChart, go to [email or web address].

## 2019-12-04 DIAGNOSIS — Z87440 Personal history of urinary (tract) infections: Secondary | ICD-10-CM | POA: Diagnosis not present

## 2019-12-12 ENCOUNTER — Encounter: Payer: Self-pay | Admitting: Internal Medicine

## 2019-12-17 DIAGNOSIS — Z87440 Personal history of urinary (tract) infections: Secondary | ICD-10-CM | POA: Diagnosis not present

## 2019-12-29 ENCOUNTER — Encounter: Payer: Self-pay | Admitting: Internal Medicine

## 2019-12-29 ENCOUNTER — Other Ambulatory Visit: Payer: Self-pay

## 2019-12-29 ENCOUNTER — Ambulatory Visit (INDEPENDENT_AMBULATORY_CARE_PROVIDER_SITE_OTHER): Payer: Medicare Other | Admitting: Internal Medicine

## 2019-12-29 VITALS — BP 142/86 | HR 57 | Temp 98.3°F | Resp 18 | Ht 68.0 in | Wt 255.4 lb

## 2019-12-29 DIAGNOSIS — E785 Hyperlipidemia, unspecified: Secondary | ICD-10-CM

## 2019-12-29 DIAGNOSIS — M545 Low back pain, unspecified: Secondary | ICD-10-CM

## 2019-12-29 DIAGNOSIS — Z0000 Encounter for general adult medical examination without abnormal findings: Secondary | ICD-10-CM | POA: Diagnosis not present

## 2019-12-29 DIAGNOSIS — Z79899 Other long term (current) drug therapy: Secondary | ICD-10-CM

## 2019-12-29 DIAGNOSIS — D509 Iron deficiency anemia, unspecified: Secondary | ICD-10-CM | POA: Diagnosis not present

## 2019-12-29 DIAGNOSIS — I480 Paroxysmal atrial fibrillation: Secondary | ICD-10-CM | POA: Diagnosis not present

## 2019-12-29 DIAGNOSIS — R739 Hyperglycemia, unspecified: Secondary | ICD-10-CM | POA: Diagnosis not present

## 2019-12-29 DIAGNOSIS — I10 Essential (primary) hypertension: Secondary | ICD-10-CM | POA: Diagnosis not present

## 2019-12-29 DIAGNOSIS — Z0001 Encounter for general adult medical examination with abnormal findings: Secondary | ICD-10-CM

## 2019-12-29 DIAGNOSIS — G8929 Other chronic pain: Secondary | ICD-10-CM | POA: Diagnosis not present

## 2019-12-29 MED ORDER — ACETAMINOPHEN-CODEINE #3 300-30 MG PO TABS
1.0000 | ORAL_TABLET | Freq: Every day | ORAL | 0 refills | Status: DC | PRN
Start: 2019-12-29 — End: 2020-03-12

## 2019-12-29 NOTE — Patient Instructions (Signed)
Check the  blood pressure 2 or 3 times a month  BP GOAL is between 110/65 and  135/85. If it is consistently higher or lower, let me know    the average.    GO TO THE LAB : Get the blood work     [place], [place] back for a checkup in 6 months

## 2019-12-29 NOTE — Progress Notes (Signed)
Pre visit review using our clinic review tool, if applicable. No additional management support is needed unless otherwise documented below in the visit note. 

## 2019-12-29 NOTE — Progress Notes (Signed)
Subjective:    Patient ID: Veronica Bradford, female    DOB: 1945-09-27, 74 y.o.   MRN: [id number]  DOS:  12/29/2019 Type of visit - description: cpx Since the last office visit, she saw several doctors, notes reviewed. In general is feeling well.    Review of Systems History of IBS, has episodic nausea, vomiting . No chest pain no difficulty breathing. Palpitation has decreased significantly since she started flecainide. Occasional lower extremity edema, at baseline. Emotionally doing okay  Other than above, a 14 point review of systems is negative      Past Medical History:  Diagnosis Date  . Atrial fibrillation (HCC)   . Back pain   . Chronic sinusitis    s/p 2 surgeries remotely, Dr Ezzard Standing  . Closed fracture of metatarsal of left foot    L foot, fifth metatarsal  . Ectopic pregnancy   . Endometrial cancer (HCC) 2004   Early diagnosis  . GERD (gastroesophageal reflux disease)   . Hyperlipemia   . Hypertension   . IBS (irritable bowel syndrome) 08/13/2014   Dx 2015, diarrhea on-off, Dr Matthias Hughs   . Lumbar radiculopathy 08/2012   MRI in 08/2012  . Mild depression (HCC)    Pt's son has Down's Syndrome  . Mild hyperparathyroidism (HCC)   . Osteopenia   . PAF (paroxysmal atrial fibrillation) (HCC)   . Prediabetes   . Sciatica of right side 08/2013   Received Depomedrol 80 mg injection  . Sleep apnea 09/09/2013   Dx with OSA in 2015, by Dr. Graciela Husbands, APAP    Past Surgical History:  Procedure Laterality Date  . ABDOMINAL HYSTERECTOMY    . ATRIAL FIBRILLATION ABLATION N/A 09/04/2017   Procedure: ATRIAL FIBRILLATION ABLATION;  Surgeon: Hillis Range, MD;  Location: [facility];  Service: Cardiovascular;  Laterality: N/A;  . CARPAL TUNNEL RELEASE Right 06/17/2019  . CHOLECYSTECTOMY  ~2006  . DILATION AND CURETTAGE OF UTERUS     after SAB  . ESOPHAGOGASTRODUODENOSCOPY  02/09/2014   Dr. Matthias Hughs  . FINGER SURGERY Left    index  . NASAL SINUS SURGERY     x 2 remotely,  Dr Ezzard Standing  . TONSILLECTOMY    . TOTAL ABDOMINAL HYSTERECTOMY W/ BILATERAL SALPINGOOPHORECTOMY    . TOTAL KNEE ARTHROPLASTY Right ~2008    Allergies as of 12/29/2019      Reactions   Nsaids Other (See Comments)   Pt on Xarelto   Penicillins    Local reaction   Tolmetin Other (See Comments)   Pt on Xarelto   Zoster Vaccine Live Other (See Comments)   Sickness per Pt.    Amoxicillin Rash      Medication List       Accurate as of December 29, 2019 11:59 PM. If you have any questions, ask your nurse or doctor.        STOP taking these medications   Magnesium 400 MG Tabs Stopped by: Willow Ora, MD   sulfamethoxazole-trimethoprim 800-160 MG tablet Commonly known as: BACTRIM DS Stopped by: Willow Ora, MD     TAKE these medications   acetaminophen 325 MG tablet Commonly known as: TYLENOL Take 325 mg by mouth as needed.   acetaminophen-codeine 300-30 MG tablet Commonly known as: TYLENOL #3 Take 1 tablet by mouth daily as needed for moderate pain.   Chromium Picolinate 1000 MCG Tabs Take 1,000 mcg by mouth every evening.   Co Q 10 100 MG Caps Take 100 mg by mouth daily.  Cranberry 600 MG Tabs   diltiazem 30 MG tablet Commonly known as: Cardizem Take 1 tablet every 4 hours AS NEEDED for heart rate >100 as long as blood pressure >100.   Fish Oil 1000 MG Caps Take 1,000 mg by mouth daily.   flecainide 50 MG tablet Commonly known as: TAMBOCOR Take 1 tablet (50 mg total) by mouth 2 (two) times daily.   fluticasone 50 MCG/ACT nasal spray Commonly known as: FLONASE Place 1 spray into both nostrils daily.   furosemide 20 MG tablet Commonly known as: LASIX Take 20 mg by mouth as needed for fluid.   loperamide 2 MG tablet Commonly known as: IMODIUM A-D Take 2 mg by mouth daily.   omeprazole 20 MG capsule Commonly known as: PRILOSEC Take 20 mg by mouth daily.   pravastatin 40 MG tablet Commonly known as: PRAVACHOL Take 1 tablet (40 mg total) by mouth at  bedtime.   rivaroxaban 20 MG Tabs tablet Commonly known as: Xarelto TAKE 1 TABLET BY MOUTH  DAILY WITH SUPPER   valsartan 320 MG tablet Commonly known as: DIOVAN Take 1 tablet (320 mg total) by mouth daily.   vitamin B-12 1000 MCG tablet Commonly known as: CYANOCOBALAMIN Take 2,000 mcg by mouth every evening.   Vitamin B6 200 MG Tabs          Objective:   Physical Exam BP (!) 142/86 (BP Location: Left Arm, Patient Position: Sitting, Cuff Size: Normal)   Pulse (!) 57   Temp 98.3 F (36.8 C) (Oral)   Resp 18   Ht 5\' 8"  (1.727 m)   Wt 255 lb 6 oz (115.8 kg)   SpO2 97%   BMI 38.83 kg/m  General: Well developed, NAD, BMI noted Neck: No  thyromegaly  HEENT:  Normocephalic . Face symmetric, atraumatic Lungs:  CTA B Normal respiratory effort, no intercostal retractions, no accessory muscle use. Heart: RRR,  no murmur.  Abdomen:  Not distended, soft, non-tender. No rebound or rigidity.   Lower extremities: no pretibial edema bilaterally  Skin: Exposed areas without rash. Not pale. Not jaundice Neurologic:  alert & oriented X3.  Speech normal, gait appropriate for age and unassisted Strength symmetric and appropriate for age.  Psych: Cognition and judgment appear intact.  Cooperative with normal attention span and concentration.  Behavior appropriate. No anxious or depressed appearing.     Assessment      Assessment: Prediabetes HTN Hyperlipidemia Chronic lower extremity edema L>R (eval by previous pcp, related to varicose veins?) CV:  --P.  Atrial fibrillation, A. flutter: on xarelto flecainide-diltiazem  prn --Ablation 09/2017 GI: Dr [name]  --GERD (zantac or protonix prn), IBS, chronic diarrhea --GI records: [place], 2004, 2009 8 and 07-2013. Negative for polyps or IBD. + Diverticuli. Next 202 due to [place]. Had a EGD/cscope 11/2016 d --Increased LFTs: CT abdomen 05/2016: Normal liver. 05/2017: WNL  Hepatitis B and C, Alpha 1 antitrypsin,  anti-smooth muscle antibody, ANA trans-ferritin  OSA--- CPAP, Dr. Maxwell Caul Osteopenia:  Multiple  Dexas, last two:  T score 2009  -1.1; T score 2015  -0.7; T score 05/2017 -1.8 DJD --Chronic back pain-- tylenol #3 prn --- RF per PCP --history of epidurals in the past, MRI 2014: Scoliosis,DJD --CIPRO pre dental d/t knee replacement (rx by ortho) H/o endometrial cancer, found during a hysterectomy, no chemotherapy or XRT.  Primary hyperparathyroidism: Chronic hypercalcemia, increased PTH 05/2016, refered to ENT  PLAN:  Here for CPX Prediabetes: A1c stable for years, recheck today. HTN: BP today is  142/86, continue Lasix, Diovan.  Checking labs. Paroxysmal A. fib: Saw cardiology 11/21/2019, remains anticoagulated, treatment options discussed.  Was prescribed flecainide, it has help: significantly less palpitations.  The patient stopped Mg and request levels rechecked. Primary hyperparathyroidism: T score -1.7 on November 2021. Saw general surgery April 2021 regards hyperparathyroidism and thyroid nodules, was recommended to return in 1 year Anemia: Saw GI 08/01/2019, Iron 74, iron saturation 8%, ferritin 5.9. was rec EGD and colonoscopy   Reports not available, apparently in transit to be scanned.  Checking labs DJD: Refill Tylenol 3.  PDMP okay.  RF sent, contract and UDS today RTC 6 months  In addition to CPX, we addressed her chronic medical issues, did extensive chart review as well.   This visit occurred during the SARS-CoV-2 public health emergency.  Safety protocols were in place, including screening questions prior to the visit, additional usage of staff PPE, and extensive cleaning of exam room while observing appropriate contact time as indicated for disinfecting solutions.

## 2019-12-30 ENCOUNTER — Encounter: Payer: Self-pay | Admitting: Internal Medicine

## 2019-12-30 LAB — BASIC METABOLIC PANEL
BUN: 11 mg/dL (ref 6–23)
CO2: 28 mEq/L (ref 19–32)
Calcium: 10.3 mg/dL (ref 8.4–10.5)
Chloride: 107 mEq/L (ref 96–112)
Creatinine, Ser: 0.59 mg/dL (ref 0.40–1.20)
GFR: 90.28 mL/min (ref >60.00)
Glucose, Bld: 84 mg/dL (ref 70–99)
Potassium: 3.9 mEq/L (ref 3.5–5.1)
Sodium: 142 mEq/L (ref 135–145)

## 2019-12-30 LAB — DRUG MONITORING, PANEL 8 WITH CONFIRMATION, URINE
6 Acetylmorphine: NEGATIVE ng/mL (ref <10)
Alcohol Metabolites: NEGATIVE ng/mL
Amphetamines: NEGATIVE ng/mL (ref <500)
Benzodiazepines: NEGATIVE ng/mL (ref <100)
Buprenorphine, Urine: NEGATIVE ng/mL (ref <5)
Cocaine Metabolite: NEGATIVE ng/mL (ref <150)
Creatinine: 24.5 mg/dL
MDMA: NEGATIVE ng/mL (ref <500)
Marijuana Metabolite: NEGATIVE ng/mL (ref <20)
Opiates: NEGATIVE ng/mL (ref <100)
Oxidant: NEGATIVE ug/mL
Oxycodone: NEGATIVE ng/mL (ref <100)
pH: 6.4 (ref 4.5–9.0)

## 2019-12-30 LAB — CBC WITH DIFFERENTIAL/PLATELET
Basophils Absolute: 0.1 10*3/uL (ref 0.0–0.1)
Basophils Relative: 1.3 % (ref 0.0–3.0)
Eosinophils Absolute: 0.2 10*3/uL (ref 0.0–0.7)
Eosinophils Relative: 3.3 % (ref 0.0–5.0)
HCT: 40.1 % (ref 36.0–46.0)
Hemoglobin: 13.3 g/dL (ref 12.0–15.0)
Lymphocytes Relative: 27.4 % (ref 12.0–46.0)
Lymphs Abs: 1.6 10*3/uL (ref 0.7–4.0)
MCHC: 33.2 g/dL (ref 30.0–36.0)
MCV: 87 fl (ref 78.0–100.0)
Monocytes Absolute: 0.5 10*3/uL (ref 0.1–1.0)
Monocytes Relative: 8.7 % (ref 3.0–12.0)
Neutro Abs: 3.4 10*3/uL (ref 1.4–7.7)
Neutrophils Relative %: 59.3 % (ref 43.0–77.0)
Platelets: 223 10*3/uL (ref 150.0–400.0)
RBC: 4.61 Mil/uL (ref 3.87–5.11)
RDW: 15.1 % (ref 11.5–15.5)
WBC: 5.8 10*3/uL (ref 4.0–10.5)

## 2019-12-30 LAB — IRON: Iron: 66 ug/dL (ref 42–145)

## 2019-12-30 LAB — DM TEMPLATE

## 2019-12-30 LAB — FERRITIN: Ferritin: 22.9 ng/mL (ref 10.0–291.0)

## 2019-12-30 LAB — MAGNESIUM: Magnesium: 2.1 mg/dL (ref 1.5–2.5)

## 2019-12-30 LAB — HEMOGLOBIN A1C: Hgb A1c MFr Bld: 5.5 % (ref 4.6–6.5)

## 2019-12-30 NOTE — Assessment & Plan Note (Signed)
Here for CPX Prediabetes: A1c stable for years, recheck today. HTN: BP today is 142/86, continue Lasix, Diovan.  Checking labs. Paroxysmal A. fib: Saw cardiology 11/21/2019, remains anticoagulated, treatment options discussed.  Was prescribed flecainide, it has help: significantly less palpitations.  The patient stopped Mg and request levels rechecked. Primary hyperparathyroidism: T score -1.7 on November 2021. Saw general surgery April 2021 regards hyperparathyroidism and thyroid nodules, was recommended to return in 1 year Anemia: Saw GI 08/01/2019, Iron 74, iron saturation 8%, ferritin 5.9. was rec EGD and colonoscopy   Reports not available, apparently in transit to be scanned.  Checking labs DJD: Refill Tylenol 3.  PDMP okay.  RF sent, contract and UDS today RTC 6 months

## 2019-12-30 NOTE — Assessment & Plan Note (Signed)
--  Td 2013 - pnm shot 2015; - prevnar 2015 -  zostavax 2013: had s/e  severe HA (and fever?) after zostavax ;  reluctant to take shingrex  - had COVID vaccine x3 -Had a flu shot --Female care: per gyn, h/o endometrial ca; saw Gyn 10-19-2019 per pt  Last mammogram 11-2019 ([facility]) --CCS:  cscope 2015 -- had a  Colonoscopy & EGD 11-2016 d/t GI sx; no Cscope  report, + polyps per pt; had a cscope again 2021, no reports --Diet and exercise discussed --Labs:  BMP,A1C magnesium, FLP, CBC, iron, ferritin,

## 2020-01-07 NOTE — Telephone Encounter (Signed)
See also pt advice request - Spoke with pt who reports she has had a breakthrough episode of rapid Afib this am.  Pt states this was confirmed by her Fitbit and Kardia. Pt took Dilt 30mg  at that time. Pt denies current CP, SOB. Dizziness or syncope.  Pt spoke with On-call provider around 7am and was advised to take morning dose of Flecainide now which pt did.  Current HR between 98 and 105 with BP 150/90.  On call provider told pt she would be contacted by Dr staff for further advisement.  Pt's next appointment with Dr Odessa Fleming is a virtual visit 02/05/2020.

## 2020-01-07 NOTE — Telephone Encounter (Signed)
Received phone from pt who states she remains in Afib with HR in 80's - 90's.  Pt's states she is currently asymptomatic.  Pt advised per Otilio Saber, PA-C continue to monitor and take Diltiazem as directed.  If pt develops symptoms as discussed previously she should seek attention in the ED.  Pt advised RN will be out of the office tomorrow but she may contact triage with update as well as on call provider if needed.  Pt verbalizes understanding and agrees with current plan.

## 2020-01-16 MED ORDER — RIVAROXABAN 20 MG PO TABS
20.0000 mg | ORAL_TABLET | Freq: Every day | ORAL | 3 refills | Status: DC
Start: 2020-01-16 — End: 2020-11-03

## 2020-01-16 NOTE — Addendum Note (Signed)
Addended by: Thora Lance on: 01/16/2020 03:48 PM   Modules accepted: Orders

## 2020-01-19 DIAGNOSIS — G4733 Obstructive sleep apnea (adult) (pediatric): Secondary | ICD-10-CM | POA: Diagnosis not present

## 2020-01-31 DIAGNOSIS — G4733 Obstructive sleep apnea (adult) (pediatric): Secondary | ICD-10-CM | POA: Diagnosis not present

## 2020-02-05 ENCOUNTER — Telehealth: Payer: Self-pay

## 2020-02-05 ENCOUNTER — Telehealth (INDEPENDENT_AMBULATORY_CARE_PROVIDER_SITE_OTHER): Payer: Medicare Other | Admitting: Internal Medicine

## 2020-02-05 ENCOUNTER — Other Ambulatory Visit: Payer: Self-pay

## 2020-02-05 VITALS — BP 126/56 | HR 52 | Ht 68.0 in | Wt 254.0 lb

## 2020-02-05 DIAGNOSIS — I480 Paroxysmal atrial fibrillation: Secondary | ICD-10-CM

## 2020-02-05 NOTE — Patient Instructions (Signed)
Medication Instructions:  Your physician has recommended you make the following change in your medication:   ** You may take Flecainide 100mg  as needed in addition to your standard dose for prolonged episodes of Afib. *If you need a refill on your cardiac medications before your next appointment, please call your pharmacy*   Lab Work: None ordered.  If you have labs (blood work) drawn today and your tests are completely normal, you will receive your results only by: Marland Kitchen MyChart Message (if you have MyChart) OR . A paper copy in the mail If you have any lab test that is abnormal or we need to change your treatment, we will call you to review the results.   Testing/Procedures: None ordered.    Follow-Up: At [facility], you and your health needs are our priority.  As part of our continuing mission to provide you with exceptional heart care, we have created designated Provider Care Teams.  These Care Teams include your primary Cardiologist (physician) and Advanced Practice Providers (APPs -  Physician Assistants and Nurse Practitioners) who all work together to provide you with the care you need, when you need it.  We recommend signing up for the patient portal called "MyChart".  Sign up information is provided on this After Visit Summary.  MyChart is used to connect with patients for Virtual Visits (Telemedicine).  Patients are able to view lab/test results, encounter notes, upcoming appointments, etc.  Non-urgent messages can be sent to your provider as well.   To learn more about what you can do with MyChart, go to [email or web address].    Your next appointment:   6 month(s)  The format for your next appointment:   In Person  Provider:   Virl Axe, MD

## 2020-02-05 NOTE — Progress Notes (Signed)
Electrophysiology TeleHealth Note   Due to national recommendations of social distancing due to COVID 19, an audio/video telehealth visit is felt to be most appropriate for this patient at this time.  See MyChart message from today for the patient's consent to telehealth for [facility].   Date:  02/05/2020   ID:  Veronica Bradford, DOB 08/15/1945, MRN [id number]  Location: patient's home  Provider location: [address] [place]  Evaluation Performed: Follow-up visit  PCP:  Colon Branch, MD  Cardiologist:  no Electrophysiologist:  SK   Chief Complaint:  Afib   History of Present Illness:    Veronica Bradford is a 75 y.o. female who presents via audio/video conferencing for a telehealth visit today.  Since last being seen in our clinic for persistent atrial fibrillation and PVCs  the patient reports doing much better.  PVCs are much less and brain fog is better  One episode of intercurrent atrial fibrillation  Lasted about 12 hrs, some palps assoc with heaviness and dyspnea  Prior history of atrial fibrillation ablation 9/19-JA.  Because of recent episodes of atrial fibrillation we started her on flecainide with some trepidation given her propensity to bradycardia precluding the use of concomitant AV nodal blocking drugs.  There has been one interval atrial fibrillation.  Some PACs on kardiaMobile   Dofetilide was precluded at her last discussions because of concerns of hospitalization during the pandemic  Date Cr Hgb  K Mag  5/18 0.66 14.7 3.7 2.1  10/21  0.69 12.6 3.6 2.2 6  12/21  13.3         DATE TEST EF   9/15 Myoview  65 % No ischemia  8/17 Echo   60-65 %   8/21 Myoview 71% No ischemia  8/21 Echo  60-65% LAE-mod-severe  (42.5 ml/m2)         DATE PR interval QRSduration Dose  10/21 194 160 0  11/21 212 164 50      Monitor 8/21>> scant Afib ( 2 hrs) and rare PVCs  Monitor 11/21 >> AFib none   PVCs 6 %   The patient denies symptoms of  fevers, chills, cough, or new SOB worrisome for COVID 19.  Past Medical History:  Diagnosis Date  . Atrial fibrillation ([place])   . Back pain   . Chronic sinusitis    s/p 2 surgeries remotely, [name]  . Closed fracture of metatarsal of left foot    L foot, fifth metatarsal  . Ectopic pregnancy   . Endometrial cancer ([place]) 2004   Early diagnosis  . GERD (gastroesophageal reflux disease)   . Hyperlipemia   . Hypertension   . IBS (irritable bowel syndrome) 08/13/2014   Dx 2015, diarrhea on-off, Dr [name]   . Lumbar radiculopathy 08/2012   MRI in 08/2012  . Mild depression ([place])    Pt's son has Down's Syndrome  . Mild hyperparathyroidism ([place])   . Osteopenia   . PAF (paroxysmal atrial fibrillation) ([place])   . Prediabetes   . Sciatica of right side 08/2013   Received Depomedrol 80 mg injection  . Sleep apnea 09/09/2013   Dx with OSA in 2015, by Dr. Caryl Comes, APAP    Past Surgical History:  Procedure Laterality Date  . ABDOMINAL HYSTERECTOMY    . ATRIAL FIBRILLATION ABLATION N/A 09/04/2017   Procedure: ATRIAL FIBRILLATION ABLATION;  Surgeon: [name], MD;  Location: [facility];  Service: Cardiovascular;  Laterality: N/A;  .  CARPAL TUNNEL RELEASE Right 06/17/2019  . CHOLECYSTECTOMY  ~2006  . DILATION AND CURETTAGE OF UTERUS     after SAB  . ESOPHAGOGASTRODUODENOSCOPY  02/09/2014   Dr. [name]  . FINGER SURGERY Left    index  . NASAL SINUS SURGERY     x 2 remotely, [name]  . TONSILLECTOMY    . TOTAL ABDOMINAL HYSTERECTOMY W/ BILATERAL SALPINGOOPHORECTOMY    . TOTAL KNEE ARTHROPLASTY Right ~2008    Current Outpatient Medications  Medication Sig Dispense Refill  . acetaminophen (TYLENOL) 325 MG tablet Take 325 mg by mouth as needed.    Marland Kitchen acetaminophen-codeine (TYLENOL #3) 300-30 MG tablet Take 1 tablet by mouth daily as needed for moderate pain. 30 tablet 0  . Chromium Picolinate 1000 MCG TABS Take 1,000 mcg by mouth every evening.    . Coenzyme Q10 (CO Q 10) 100 MG  CAPS Take 100 mg by mouth daily.     . Cranberry 600 MG TABS     . diltiazem (CARDIZEM) 30 MG tablet Take 1 tablet every 4 hours AS NEEDED for heart rate >100 as long as blood pressure >100. 45 tablet 5  . ferrous sulfate 325 (65 FE) MG tablet Take 325 mg by mouth daily with breakfast.    . flecainide (TAMBOCOR) 50 MG tablet Take 1 tablet (50 mg total) by mouth 2 (two) times daily. 180 tablet 3  . fluticasone (FLONASE) 50 MCG/ACT nasal spray Place 1 spray into both nostrils daily.    . furosemide (LASIX) 20 MG tablet Take 20 mg by mouth as needed for fluid.    Marland Kitchen loperamide (IMODIUM A-D) 2 MG tablet Take 2 mg by mouth daily.    . Omega-3 Fatty Acids (FISH OIL) 1000 MG CAPS Take 1,000 mg by mouth daily.     Marland Kitchen omeprazole (PRILOSEC) 20 MG capsule Take 20 mg by mouth daily.    . pravastatin (PRAVACHOL) 40 MG tablet Take 1 tablet (40 mg total) by mouth at bedtime. 90 tablet 1  . Pyridoxine HCl (VITAMIN B6) 200 MG TABS     . rivaroxaban (XARELTO) 20 MG TABS tablet Take 1 tablet (20 mg total) by mouth daily with supper. 90 tablet 3  . valsartan (DIOVAN) 320 MG tablet Take 1 tablet (320 mg total) by mouth daily. 90 tablet 0  . vitamin B-12 (CYANOCOBALAMIN) 1000 MCG tablet Take 2,000 mcg by mouth every evening.      No current facility-administered medications for this visit.    Allergies:   Nsaids, Penicillins, Tolmetin, Zoster vaccine live, and Amoxicillin   Social History:  The patient  reports that she quit smoking about 40 years ago. She has never used smokeless tobacco. She reports current alcohol use. She reports that she does not use drugs.   Family History:  The patient's   family history includes Breast cancer in her mother; Colon cancer in her mother; Diabetes in her father; Head & neck cancer in her sister; Hypertension in her father and mother; Ovarian cancer in her mother.   ROS:  Please see the history of present illness.   All other systems are personally reviewed and negative.     Exam:    Vital Signs:  BP (!) 126/56   Pulse (!) 52   Ht 5\' 8"  (1.727 m)   Wt 254 lb (115.2 kg)   BMI 38.62 kg/m      Labs/Other Tests and Data Reviewed:    Recent Labs: 10/29/2019: ALT 23; TSH 0.719 12/29/2019: BUN  11; Creatinine, Ser 0.59; Hemoglobin 13.3; Magnesium 2.1; Platelets 223.0; Potassium 3.9; Sodium 142   Wt Readings from Last 3 Encounters:  02/05/20 254 lb (115.2 kg)  12/29/19 255 lb 6 oz (115.8 kg)  12/01/19 256 lb 3.2 oz (116.2 kg)     Other studies personally reviewed: Additional studies/ records that were reviewed today include: *remote trac9ing >> NSR +/- PACs Review of the above records today demonstrates:    ASSESSMENT & PLAN:    Atrial fibrillation-paroxysmal   CHADS-VASc score 2 (age/gender)    Obesity  RBBB  PVCs  Sinus bradycardia  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  With PVCs improved, less brain fog,  Will continue flec at current notice  Rare atrial fib  Continue flec  No bleeding   COVID 19 screen The patient denies symptoms of COVID 19 at this time.  The importance of social distancing was discussed today.  Follow-up:  92m    Current medicines are reviewed at length with the patient today.   The patient does not have concerns regarding her medicines.  The following changes were made today:  Take flecainide 100 mg prn in addition to standard dose with prolonged AFib Suggested use of prn benzo with cardiac assoc anxiety  Suggested she discuss this with her PCP  Labs/ tests ordered today include:   No orders of the defined types were placed in this encounter.   Future tests ( post COVID )     Patient Risk:  after full review of this patients clinical status, I feel that they are at moderate*  risk at this time.  Today, I have spent 21 minutes with the patient with telehealth technology discussing the above.  Signed, Virl Axe, MD  02/05/2020 2:08 PM     [facility] [address] [place] [address] [phone] (office) [phone] (fax)

## 2020-02-05 NOTE — Telephone Encounter (Signed)
  Patient Consent for Virtual Visit         Veronica Bradford has provided verbal consent on 02/05/2020 for a virtual visit (video or telephone).   CONSENT FOR VIRTUAL VISIT FOR:  Veronica Bradford  By participating in this virtual visit I agree to the following:  I hereby voluntarily request, consent and authorize [facility] and its employed or contracted physicians, physician assistants, nurse practitioners or other licensed health care professionals (the Practitioner), to provide me with telemedicine health care services (the "Services") as deemed necessary by the treating Practitioner. I acknowledge and consent to receive the Services by the Practitioner via telemedicine. I understand that the telemedicine visit will involve communicating with the Practitioner through live audiovisual communication technology and the disclosure of certain medical information by electronic transmission. I acknowledge that I have been given the opportunity to request an in-person assessment or other available alternative prior to the telemedicine visit and am voluntarily participating in the telemedicine visit.  I understand that I have the right to withhold or withdraw my consent to the use of telemedicine in the course of my care at any time, without affecting my right to future care or treatment, and that the Practitioner or I may terminate the telemedicine visit at any time. I understand that I have the right to inspect all information obtained and/or recorded in the course of the telemedicine visit and may receive copies of available information for a reasonable fee.  I understand that some of the potential risks of receiving the Services via telemedicine include:  Marland Kitchen Delay or interruption in medical evaluation due to technological equipment failure or disruption; . Information transmitted may not be sufficient (e.g. poor resolution of images) to allow for appropriate medical decision making by the Practitioner;  and/or  . In rare instances, security protocols could fail, causing a breach of personal health information.  Furthermore, I acknowledge that it is my responsibility to provide information about my medical history, conditions and care that is complete and accurate to the best of my ability. I acknowledge that Practitioner's advice, recommendations, and/or decision may be based on factors not within their control, such as incomplete or inaccurate data provided by me or distortions of diagnostic images or specimens that may result from electronic transmissions. I understand that the practice of medicine is not an exact science and that Practitioner makes no warranties or guarantees regarding treatment outcomes. I acknowledge that a copy of this consent can be made available to me via my patient portal ([place]), or I can request a printed copy by calling the office of [facility].    I understand that my insurance will be billed for this visit.   I have read or had this consent read to me. . I understand the contents of this consent, which adequately explains the benefits and risks of the Services being provided via telemedicine.  . I have been provided ample opportunity to ask questions regarding this consent and the Services and have had my questions answered to my satisfaction. . I give my informed consent for the services to be provided through the use of telemedicine in my medical care

## 2020-02-23 ENCOUNTER — Other Ambulatory Visit: Payer: Self-pay | Admitting: Internal Medicine

## 2020-02-27 DIAGNOSIS — R112 Nausea with vomiting, unspecified: Secondary | ICD-10-CM | POA: Diagnosis not present

## 2020-02-27 DIAGNOSIS — R197 Diarrhea, unspecified: Secondary | ICD-10-CM | POA: Diagnosis not present

## 2020-02-27 LAB — COMPREHENSIVE METABOLIC PANEL
Albumin: 3.9 (ref 3.5–5.0)
Calcium: 9.9 (ref 8.7–10.7)

## 2020-02-27 LAB — BASIC METABOLIC PANEL
BUN: 10 (ref 4–21)
CO2: 20 (ref 13–22)
Chloride: 108 (ref 99–108)
Creatinine: 0.7 (ref 0.5–1.1)
Glucose: 101
Potassium: 3.8 (ref 3.4–5.3)
Sodium: 143 (ref 137–147)

## 2020-02-27 LAB — HEPATIC FUNCTION PANEL
ALT: 27 (ref 7–35)
AST: 45 — AB (ref 13–35)
Alkaline Phosphatase: 114 (ref 25–125)

## 2020-02-27 LAB — CBC: RBC: 5.12 — AB (ref 3.87–5.11)

## 2020-02-27 LAB — CBC AND DIFFERENTIAL
HCT: 46 (ref 36–46)
Hemoglobin: 15.3 (ref 12.0–16.0)
WBC: 9.8

## 2020-03-04 ENCOUNTER — Other Ambulatory Visit: Payer: Self-pay | Admitting: Physician Assistant

## 2020-03-04 DIAGNOSIS — R112 Nausea with vomiting, unspecified: Secondary | ICD-10-CM | POA: Diagnosis not present

## 2020-03-04 DIAGNOSIS — R197 Diarrhea, unspecified: Secondary | ICD-10-CM | POA: Diagnosis not present

## 2020-03-09 ENCOUNTER — Other Ambulatory Visit: Payer: Self-pay

## 2020-03-09 ENCOUNTER — Inpatient Hospital Stay (HOSPITAL_COMMUNITY)
Admission: AD | Admit: 2020-03-09 | Discharge: 2020-03-12 | DRG: 392 | Disposition: A | Discharge status: Home / Self Care | Payer: Medicare Other | Source: Ambulatory Visit | Attending: Internal Medicine | Admitting: Internal Medicine

## 2020-03-09 ENCOUNTER — Inpatient Hospital Stay (HOSPITAL_COMMUNITY): Payer: Medicare Other

## 2020-03-09 ENCOUNTER — Encounter (HOSPITAL_COMMUNITY): Payer: Self-pay | Admitting: Internal Medicine

## 2020-03-09 DIAGNOSIS — K2970 Gastritis, unspecified, without bleeding: Secondary | ICD-10-CM | POA: Diagnosis not present

## 2020-03-09 DIAGNOSIS — Z880 Allergy status to penicillin: Secondary | ICD-10-CM | POA: Diagnosis not present

## 2020-03-09 DIAGNOSIS — K3189 Other diseases of stomach and duodenum: Secondary | ICD-10-CM | POA: Diagnosis not present

## 2020-03-09 DIAGNOSIS — Z6838 Body mass index (BMI) 38.0-38.9, adult: Secondary | ICD-10-CM

## 2020-03-09 DIAGNOSIS — M8580 Other specified disorders of bone density and structure, unspecified site: Secondary | ICD-10-CM | POA: Diagnosis not present

## 2020-03-09 DIAGNOSIS — Z96651 Presence of right artificial knee joint: Secondary | ICD-10-CM | POA: Diagnosis present

## 2020-03-09 DIAGNOSIS — I480 Paroxysmal atrial fibrillation: Secondary | ICD-10-CM | POA: Diagnosis not present

## 2020-03-09 DIAGNOSIS — Z90710 Acquired absence of both cervix and uterus: Secondary | ICD-10-CM | POA: Diagnosis not present

## 2020-03-09 DIAGNOSIS — C541 Malignant neoplasm of endometrium: Secondary | ICD-10-CM | POA: Diagnosis present

## 2020-03-09 DIAGNOSIS — K580 Irritable bowel syndrome with diarrhea: Secondary | ICD-10-CM | POA: Diagnosis present

## 2020-03-09 DIAGNOSIS — Z7901 Long term (current) use of anticoagulants: Secondary | ICD-10-CM | POA: Diagnosis not present

## 2020-03-09 DIAGNOSIS — Z886 Allergy status to analgesic agent status: Secondary | ICD-10-CM | POA: Diagnosis not present

## 2020-03-09 DIAGNOSIS — K589 Irritable bowel syndrome without diarrhea: Secondary | ICD-10-CM | POA: Diagnosis present

## 2020-03-09 DIAGNOSIS — Z20822 Contact with and (suspected) exposure to covid-19: Secondary | ICD-10-CM | POA: Diagnosis present

## 2020-03-09 DIAGNOSIS — E213 Hyperparathyroidism, unspecified: Secondary | ICD-10-CM | POA: Diagnosis not present

## 2020-03-09 DIAGNOSIS — K649 Unspecified hemorrhoids: Secondary | ICD-10-CM | POA: Diagnosis present

## 2020-03-09 DIAGNOSIS — I5032 Chronic diastolic (congestive) heart failure: Secondary | ICD-10-CM | POA: Diagnosis present

## 2020-03-09 DIAGNOSIS — E669 Obesity, unspecified: Secondary | ICD-10-CM | POA: Diagnosis present

## 2020-03-09 DIAGNOSIS — E785 Hyperlipidemia, unspecified: Secondary | ICD-10-CM | POA: Diagnosis present

## 2020-03-09 DIAGNOSIS — Z888 Allergy status to other drugs, medicaments and biological substances status: Secondary | ICD-10-CM | POA: Diagnosis not present

## 2020-03-09 DIAGNOSIS — R197 Diarrhea, unspecified: Secondary | ICD-10-CM | POA: Diagnosis not present

## 2020-03-09 DIAGNOSIS — E860 Dehydration: Secondary | ICD-10-CM | POA: Diagnosis not present

## 2020-03-09 DIAGNOSIS — R112 Nausea with vomiting, unspecified: Secondary | ICD-10-CM | POA: Diagnosis present

## 2020-03-09 DIAGNOSIS — I110 Hypertensive heart disease with heart failure: Secondary | ICD-10-CM | POA: Diagnosis present

## 2020-03-09 DIAGNOSIS — R1012 Left upper quadrant pain: Secondary | ICD-10-CM | POA: Diagnosis not present

## 2020-03-09 DIAGNOSIS — Z79899 Other long term (current) drug therapy: Secondary | ICD-10-CM

## 2020-03-09 DIAGNOSIS — G4733 Obstructive sleep apnea (adult) (pediatric): Secondary | ICD-10-CM | POA: Diagnosis not present

## 2020-03-09 DIAGNOSIS — R7303 Prediabetes: Secondary | ICD-10-CM | POA: Diagnosis present

## 2020-03-09 DIAGNOSIS — I5189 Other ill-defined heart diseases: Secondary | ICD-10-CM

## 2020-03-09 DIAGNOSIS — R634 Abnormal weight loss: Secondary | ICD-10-CM | POA: Diagnosis not present

## 2020-03-09 DIAGNOSIS — E861 Hypovolemia: Secondary | ICD-10-CM | POA: Diagnosis present

## 2020-03-09 DIAGNOSIS — K5730 Diverticulosis of large intestine without perforation or abscess without bleeding: Secondary | ICD-10-CM | POA: Diagnosis present

## 2020-03-09 DIAGNOSIS — Z9049 Acquired absence of other specified parts of digestive tract: Secondary | ICD-10-CM

## 2020-03-09 DIAGNOSIS — Z8542 Personal history of malignant neoplasm of other parts of uterus: Secondary | ICD-10-CM

## 2020-03-09 DIAGNOSIS — G4730 Sleep apnea, unspecified: Secondary | ICD-10-CM | POA: Diagnosis present

## 2020-03-09 DIAGNOSIS — Z8249 Family history of ischemic heart disease and other diseases of the circulatory system: Secondary | ICD-10-CM

## 2020-03-09 DIAGNOSIS — Z87891 Personal history of nicotine dependence: Secondary | ICD-10-CM

## 2020-03-09 DIAGNOSIS — K219 Gastro-esophageal reflux disease without esophagitis: Secondary | ICD-10-CM | POA: Diagnosis not present

## 2020-03-09 DIAGNOSIS — R109 Unspecified abdominal pain: Secondary | ICD-10-CM | POA: Diagnosis not present

## 2020-03-09 DIAGNOSIS — I10 Essential (primary) hypertension: Secondary | ICD-10-CM | POA: Diagnosis present

## 2020-03-09 DIAGNOSIS — K449 Diaphragmatic hernia without obstruction or gangrene: Secondary | ICD-10-CM | POA: Diagnosis not present

## 2020-03-09 DIAGNOSIS — Z833 Family history of diabetes mellitus: Secondary | ICD-10-CM

## 2020-03-09 LAB — CBC WITH DIFFERENTIAL/PLATELET
Abs Immature Granulocytes: 0.02 10*3/uL (ref 0.00–0.07)
Basophils Absolute: 0.1 10*3/uL (ref 0.0–0.1)
Basophils Relative: 1 %
Eosinophils Absolute: 1.6 10*3/uL — ABNORMAL HIGH (ref 0.0–0.5)
Eosinophils Relative: 17 %
HCT: 46.6 % — ABNORMAL HIGH (ref 36.0–46.0)
Hemoglobin: 15.2 g/dL — ABNORMAL HIGH (ref 12.0–15.0)
Immature Granulocytes: 0 %
Lymphocytes Relative: 20 %
Lymphs Abs: 1.9 10*3/uL (ref 0.7–4.0)
MCH: 29.2 pg (ref 26.0–34.0)
MCHC: 32.6 g/dL (ref 30.0–36.0)
MCV: 89.6 fL (ref 80.0–100.0)
Monocytes Absolute: 0.6 10*3/uL (ref 0.1–1.0)
Monocytes Relative: 6 %
Neutro Abs: 5.4 10*3/uL (ref 1.7–7.7)
Neutrophils Relative %: 56 %
Platelets: 262 10*3/uL (ref 150–400)
RBC: 5.2 MIL/uL — ABNORMAL HIGH (ref 3.87–5.11)
RDW: 14.4 % (ref 11.5–15.5)
WBC: 9.7 10*3/uL (ref 4.0–10.5)
nRBC: 0 % (ref 0.0–0.2)

## 2020-03-09 LAB — COMPREHENSIVE METABOLIC PANEL
ALT: 24 U/L (ref 0–44)
AST: 29 U/L (ref 15–41)
Albumin: 3.6 g/dL (ref 3.5–5.0)
Alkaline Phosphatase: 81 U/L (ref 38–126)
Anion gap: 6 (ref 5–15)
BUN: 12 mg/dL (ref 8–23)
CO2: 25 mmol/L (ref 22–32)
Calcium: 10 mg/dL (ref 8.9–10.3)
Chloride: 104 mmol/L (ref 98–111)
Creatinine, Ser: 0.62 mg/dL (ref 0.44–1.00)
GFR, Estimated: >60 mL/min (ref >60)
Glucose, Bld: 103 mg/dL — ABNORMAL HIGH (ref 70–99)
Potassium: 3.3 mmol/L — ABNORMAL LOW (ref 3.5–5.1)
Sodium: 135 mmol/L (ref 135–145)
Total Bilirubin: 0.8 mg/dL (ref 0.3–1.2)
Total Protein: 6.1 g/dL — ABNORMAL LOW (ref 6.5–8.1)

## 2020-03-09 LAB — SARS CORONAVIRUS 2 (TAT 6-24 HRS): SARS Coronavirus 2: NEGATIVE

## 2020-03-09 LAB — LIPASE, BLOOD: Lipase: 25 U/L (ref 11–51)

## 2020-03-09 LAB — MAGNESIUM: Magnesium: 2.1 mg/dL (ref 1.7–2.4)

## 2020-03-09 MED ORDER — VITAMIN B-12 1000 MCG PO TABS
2000.0000 ug | ORAL_TABLET | Freq: Every evening | ORAL | Status: DC
  Administered 2020-03-09 – 2020-03-11 (×3): 2000 ug via ORAL
  Filled 2020-03-09 (×3): qty 2

## 2020-03-09 MED ORDER — OMEGA-3-ACID ETHYL ESTERS 1 G PO CAPS
1.0000 g | ORAL_CAPSULE | Freq: Every day | ORAL | Status: DC
  Administered 2020-03-09 – 2020-03-12 (×4): 1 g via ORAL
  Filled 2020-03-09 (×3): qty 1

## 2020-03-09 MED ORDER — PRAVASTATIN SODIUM 40 MG PO TABS
40.0000 mg | ORAL_TABLET | Freq: Every day | ORAL | Status: DC
  Administered 2020-03-09 – 2020-03-10 (×2): 40 mg via ORAL
  Filled 2020-03-09 (×2): qty 1

## 2020-03-09 MED ORDER — POLYVINYL ALCOHOL 1.4 % OP SOLN
1.0000 [drp] | Freq: Three times a day (TID) | OPHTHALMIC | Status: DC | PRN
  Filled 2020-03-09: qty 15

## 2020-03-09 MED ORDER — IOHEXOL 9 MG/ML PO SOLN
500.0000 mL | ORAL | Status: AC
  Administered 2020-03-09 (×2): 500 mL via ORAL

## 2020-03-09 MED ORDER — ACETAMINOPHEN 325 MG PO TABS: 650.0000 mg | ORAL_TABLET | Freq: Four times a day (QID) | ORAL | Status: DC | PRN

## 2020-03-09 MED ORDER — PEG 3350-KCL-NA BICARB-NACL 420 G PO SOLR
4000.0000 mL | Freq: Once | ORAL | Status: AC
  Administered 2020-03-10: 4000 mL via ORAL

## 2020-03-09 MED ORDER — FLECAINIDE ACETATE 50 MG PO TABS
50.0000 mg | ORAL_TABLET | Freq: Two times a day (BID) | ORAL | Status: DC
  Administered 2020-03-09 – 2020-03-12 (×6): 50 mg via ORAL
  Filled 2020-03-09 (×6): qty 1

## 2020-03-09 MED ORDER — IOHEXOL 300 MG/ML  SOLN
100.0000 mL | Freq: Once | INTRAMUSCULAR | Status: AC | PRN
  Administered 2020-03-09: 100 mL via INTRAVENOUS

## 2020-03-09 MED ORDER — ACETAMINOPHEN 650 MG RE SUPP: 650.0000 mg | Freq: Four times a day (QID) | RECTAL | Status: DC | PRN

## 2020-03-09 MED ORDER — HYPROMELLOSE (GONIOSCOPIC) 2.5 % OP SOLN: 1.0000 [drp] | Freq: Three times a day (TID) | OPHTHALMIC | Status: DC | PRN

## 2020-03-09 MED ORDER — FLUTICASONE PROPIONATE 50 MCG/ACT NA SUSP
1.0000 | Freq: Every day | NASAL | Status: DC
  Administered 2020-03-10 – 2020-03-12 (×3): 1 via NASAL
  Filled 2020-03-09: qty 16

## 2020-03-09 MED ORDER — ONDANSETRON HCL 4 MG/2ML IJ SOLN: 4.0000 mg | Freq: Three times a day (TID) | INTRAMUSCULAR | Status: DC | PRN

## 2020-03-09 MED ORDER — VITAMIN B-6 100 MG PO TABS
200.0000 mg | ORAL_TABLET | Freq: Every day | ORAL | Status: DC
  Administered 2020-03-09 – 2020-03-12 (×4): 200 mg via ORAL
  Filled 2020-03-09 (×4): qty 2

## 2020-03-09 MED ORDER — POTASSIUM CHLORIDE CRYS ER 20 MEQ PO TBCR
40.0000 meq | EXTENDED_RELEASE_TABLET | Freq: Every day | ORAL | Status: AC
  Administered 2020-03-09 – 2020-03-10 (×2): 40 meq via ORAL
  Filled 2020-03-09: qty 2

## 2020-03-09 NOTE — Plan of Care (Signed)

## 2020-03-09 NOTE — Progress Notes (Signed)
IVT consulted for PIV placement.  RN going to assess for PIV placement and put in new consult if difficult to get a PIV.

## 2020-03-09 NOTE — Consult Note (Addendum)
Referring Provider: [facility] Primary Care Physician:  Colon Branch, MD Primary Gastroenterologist:  Dr. [name]  Reason for Consultation: Nausea and vomiting, diarrhea, weight loss  HPI: Veronica Bradford is a 75 y.o. female admitted directly to the hospital because of ongoing symptoms of nausea, vomiting, food intolerance, inability to eat, weight loss, and diarrhea unresponsive to outpatient management.  This is a morbidly obese Caucasian female with history of atrial fibrillation (chads score 4 as of August 2021) on chronic anticoagulation with Xarelto whom I have followed for years with a longstanding history of atypical diarrhea predominant IBS, historically characterized by spells of diarrhea occurring about once a week, heralded by nausea and vomiting, with resolution of symptoms in between these episodes.  Through the years, the symptoms were really not of sufficient severity to necessitate ongoing medical intervention or frequent trips to the doctor.  This past November, I performed endoscopy and colonoscopy on the patient because of Hemoccult negative iron deficiency anemia, hemoglobin 12.5, iron saturation 8%, ferritin 6.  There was some mild hemorrhagic gastritis on the endoscopy, and sigmoid diverticulosis on colonoscopy, but no source of iron deficiency anemia was identified.    Note that on a previous colonoscopy in 2018, random mucosal biopsies of the colon have been negative for microscopic colitis.  For the past couple of weeks, the patient has had persistent symptoms of nausea, vomiting, and diarrhea, which is atypical for her.  She has had some days worse than others, but she has not had a long symptom-free interval.  As a consequence, she indicates she has lost 14 pounds, although the onset of symptoms did correlate with going on weight watchers so some of the weight loss may be explained by dietary changes.  By our scales, her weight is down 11 pounds compared to her  previous baseline weight last July.  When seen recently by our physician assistant in the office, she was started on colestipol for possible choleretic diarrhea, since she is remotely status post cholecystectomy.  However, if anything the seem to make her symptoms worse.  Because of the ongoing symptoms and the absence of improvement on outpatient therapy, expedited evaluation was felt to be appropriate.  We had originally planned for an outpatient CT later this month, but with the patient's symptoms getting worse, it was not felt like she would do well with outpatient evaluation and management.  Ironically, the patient has actually been doing better over the past 48 hours.  Her last vomiting, which was quite severe and protracted, was about 36 hours prior to admission, and her last bowel movement, after taking multiple Imodium's, was about 12 hours ago so by her standards, things are actually relatively good at the moment.     Past Medical History:  Diagnosis Date  . Atrial fibrillation ([place])   . Back pain   . Chronic sinusitis    s/p 2 surgeries remotely, [name]  . Closed fracture of metatarsal of left foot    L foot, fifth metatarsal  . Ectopic pregnancy   . Endometrial cancer ([place]) 2004   Early diagnosis  . GERD (gastroesophageal reflux disease)   . Hyperlipemia   . Hypertension   . IBS (irritable bowel syndrome) 08/13/2014   Dx 2015, diarrhea on-off, Dr [name]   . Lumbar radiculopathy 08/2012   MRI in 08/2012  . 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 Osteopenia   . PAF (paroxysmal atrial fibrillation) ([place])   . Prediabetes   . Sciatica of right  side 08/2013   Received Depomedrol 80 mg injection  . Sleep apnea 09/09/2013   Dx with OSA in 2015, by Dr. Caryl Comes, APAP    Past Surgical History:  Procedure Laterality Date  . ABDOMINAL HYSTERECTOMY    . ATRIAL FIBRILLATION ABLATION N/A 09/04/2017   Procedure: ATRIAL FIBRILLATION ABLATION;  Surgeon: [name], MD;  Location: [facility];  Service: Cardiovascular;  Laterality: N/A;  . CARPAL TUNNEL RELEASE Right 06/17/2019  . CHOLECYSTECTOMY  ~2006  . DILATION AND CURETTAGE OF UTERUS     after SAB  . ESOPHAGOGASTRODUODENOSCOPY  02/09/2014   Dr. [name]  . FINGER SURGERY Left    index  . NASAL SINUS SURGERY     x 2 remotely, [name]  . TONSILLECTOMY    . TOTAL ABDOMINAL HYSTERECTOMY W/ BILATERAL SALPINGOOPHORECTOMY    . TOTAL KNEE ARTHROPLASTY Right ~2008    Prior to Admission medications   Medication Sig Start Date End Date Taking? Authorizing Provider  acetaminophen (TYLENOL) 325 MG tablet Take 325 mg by mouth as needed.    [provider]  acetaminophen-codeine (TYLENOL #3) 300-30 MG tablet Take 1 tablet by mouth daily as needed for moderate pain. 12/29/19   Colon Branch, MD  Chromium Picolinate 1000 MCG TABS Take 1,000 mcg by mouth every evening.    [provider]  Coenzyme Q10 (CO Q 10) 100 MG CAPS Take 100 mg by mouth daily.     [provider]  Cranberry 600 MG TABS  12/02/17   [provider]  diltiazem (CARDIZEM) 30 MG tablet Take 1 tablet every 4 hours AS NEEDED for heart rate >100 as long as blood pressure >100. 11/06/18   [name], MD  ferrous sulfate 325 (65 FE) MG tablet Take 325 mg by mouth daily with breakfast.    [provider]  flecainide (TAMBOCOR) 50 MG tablet Take 1 tablet (50 mg total) by mouth 2 (two) times daily. 11/21/19   [name], MD  fluticasone (FLONASE) 50 MCG/ACT nasal spray Place 1 spray into both nostrils daily.    [provider]  furosemide (LASIX) 20 MG tablet Take 20 mg by mouth as needed for fluid.    [provider]  loperamide (IMODIUM A-D) 2 MG tablet Take 2 mg by mouth daily.    [provider]  Omega-3 Fatty Acids (FISH OIL) 1000 MG CAPS Take 1,000 mg by mouth daily.     [provider]  omeprazole (PRILOSEC) 20 MG capsule Take 20 mg by mouth daily.    [provider]  pravastatin (PRAVACHOL) 40 MG tablet Take 1 tablet (40 mg total) by mouth at bedtime. 10/15/19   Colon Branch, MD  Pyridoxine HCl (VITAMIN B6) 200 MG TABS  06/17/19   [provider]  rivaroxaban (XARELTO) 20 MG TABS tablet Take 1 tablet (20 mg total) by mouth daily with supper. 01/16/20   [name], MD  valsartan (DIOVAN) 320 MG tablet TAKE 1 TABLET(320 MG) BY MOUTH DAILY 02/23/20   Colon Branch, MD  vitamin B-12 (CYANOCOBALAMIN) 1000 MCG tablet Take 2,000 mcg by mouth every evening.     [provider]    No current facility-administered medications for this encounter.    Allergies as of 03/09/2020 - Review Complete 03/09/2020  Allergen Reaction Noted  . Lisinopril  03/09/2020  . Nsaids Other (See Comments) 08/13/2014  . Penicillins  09/04/2017  . Tolmetin Other (See Comments) 06/03/2015  .  Zoster vaccine live Other (See Comments) 12/22/2014  . Amoxicillin Rash 03/15/2011    Family History  Problem Relation Age of Onset  . Hypertension Mother   . Colon cancer Mother   . Breast cancer Mother   . Ovarian cancer Mother   . Hypertension Father   . Diabetes Father   . Head & neck cancer Sister   . CAD Neg Hx     Social History   Socioeconomic History  . Marital status: Married    Spouse name: Not on file  . Number of children: 1  . Years of education: Not on file  . Highest education level: Not on file  Occupational History  . 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 Smoking status: Former Smoker    Quit date: 01/06/1980    Years since quitting: 40.2  . Smokeless tobacco: Never Used  . Tobacco comment: smoked from 1970 to 1982, less than 1 ppd  Vaping Use  . Vaping Use: Never used  Substance and Sexual Activity  . Alcohol use: Yes    Comment: rare  . Drug use: No  . Sexual activity: Not Currently    Birth control/protection: None  Other Topics Concern  . Not on file  Social History Narrative   Lives w/ husband, and son  [name]   Retired Marine scientist   Lives in [place] Strain: [place]   . Difficulty of Paying Living Expenses: Not hard at all  Food Insecurity: No Food Insecurity  . Worried About Charity fundraiser in the Last Year: Never true  . Ran Out of Food in the Last Year: Never true  Transportation Needs: No Transportation Needs  . Lack of Transportation (Medical): No  . Lack of Transportation (Non-Medical): No  Physical Activity: Not on file  Stress: Not on file  Social Connections: Not on file  Intimate Partner Violence: Not on file    Review of Systems: No chest pain, but the patient has been concerned by the fact that her heart rate has been running relatively high with minimal exertion, such as walking around in the house, with her heart rate reaching 104 (normally, it does not get up that high, even when she goes outside for a walk with her dog).  No dyspnea.  No focal neurologic symptoms.  No urinary symptoms.  No skin rashes.  Physical Exam: Vital signs in last 24 hours:     General:   Alert,  Well-developed, well-nourished, pleasant and cooperative in NAD Head:  Normocephalic and atraumatic. Eyes:  Sclera clear, no icterus.   Conjunctiva pink. Mouth:   No ulcerations or lesions.  Oropharynx pink but somewhat dry. Neck:   No masses or thyromegaly. Lungs:  Clear throughout to auscultation.   No wheezes, crackles, or rhonchi. No evident respiratory distress. Heart:   Regular rate and rhythm; no murmurs, clicks, rubs,  or gallops.  The patient might be in sinus rhythm at this time, at least there was no overt regularity to the heart rhythm. Abdomen: Moderately adipose, but without overt tenderness. Msk:   Symmetrical without gross deformities. Pulses:  Normal radial pulse is noted. Extremities:   Without clubbing, cyanosis, or edema.  The patient reports that her left leg tends to remain a bit swollen due to varicosities, although that is  not clinically evident to my exam. Neurologic:  Alert and coherent;  grossly normal neurologically. Skin:  Intact without significant lesions or  rashes. Cervical Nodes:  No significant cervical adenopathy. Psych:   Alert and cooperative. Normal mood and affect.  Intake/Output from previous day: No intake/output data recorded. Intake/Output this shift: No intake/output data recorded.  Lab Results: No results for input(s): WBC, HGB, HCT, PLT in the last 72 hours. BMET No results for input(s): NA, K, CL, CO2, GLUCOSE, BUN, CREATININE, CALCIUM in the last 72 hours. LFT No results for input(s): PROT, ALBUMIN, AST, ALT, ALKPHOS, BILITOT, BILIDIR, IBILI in the last 72 hours. PT/INR No results for input(s): LABPROT, INR in the last 72 hours.  Studies/Results: No results found.  Impression:  1.  Several week history of intractable nausea and vomiting with diarrhea with associated symptoms (tachycardia with minimal exertion) to suggest volume contraction 2.  Pre-existing longstanding history of periodic episodic nausea vomiting and diarrhea 3.  Weight loss and feeding difficulties 4.  Atrial fibrillation with chronic anticoagulation 5.  Morbid obesity 6.  History of significant left-sided diverticulosis 7.  Family history of colon cancer in her mother at age 60, with colonoscopy in 2018 showing 3 small adenomatous/serrated polyps, subsequent colonoscopic exam in 2021 negative for polyps  Plan: (Discussed with Dr. Marylyn Ishihara)  1.  Hold Xarelto in anticipation of upcoming endoscopic procedures 2.  CT of abdomen and pelvis checking for neoplasia, neuroendocrine tumor, evidence of inflammatory bowel disease,low grade bowel obstruction, etc. 3.  Prep tomorrow, endoscopy and colonoscopy on Thursday with random mucosal biopsies to check for source of nausea and diarrhea--scheduled for 7:30 a.m. Thursday. 4.  Check Chromogranin A level for possible neuroendocrine secretory tumor   LOS: 0 days    Youlanda Mighty Buccini  03/09/2020, 1:38 PM   Pager [phone] If no answer or after 5 PM call [phone]

## 2020-03-09 NOTE — H&P (Signed)
History and Physical    Veronica Bradford YSA:[id number] DOB: 09/16/1945 DOA: 03/09/2020  PCP: Colon Branch, MD  Patient coming from: [facility]  Chief Complaint: N/V/D  HPI: Veronica Bradford is a 75 y.o. female with medical history significant of IBS, a fib, HTN. She presents with N/V/D. She states that she has IBS; and when it flares, it happens in a specific way. She will typically have several hours or voluminous diarrhea and vomiting followed by days of intermittent diarrhea. Imodium typically calms everything down. 2 weeks ago, she started having her "regular" symptoms. She tried her regular treatment of imodium, but it didn't help. She tried to take as much fluids as possible but she wasn't able to keep up. She called her GI doc, and she was started on colestipol and home fluids. Her symptoms did not improve. She called her GI doc again, and they scheduled at CT. However, this CT can't be done until next week. Over the last several days, she has been unable to keep anything down at all. She also felt palpitations.  She decided to see her GI team again. In office, they found her to have soft BP and tachycardia. They recommended that she go to the hospital for evaluation.    Review of Systems:  Denies CP, syncopal episodes, dyspnea, fevers, hematochezia, hematemesis. Reports 16 lbs weight loss. Review of systems is otherwise negative for all not mentioned in HPI.   PMHx Past Medical History:  Diagnosis Date  . Atrial fibrillation ([place])   . Back pain   . Chronic sinusitis    s/p 2 surgeries remotely, [name]  . Closed fracture of metatarsal of left foot    L foot, fifth metatarsal  . Ectopic pregnancy   . Endometrial cancer ([place]) 2004   Early diagnosis  . GERD (gastroesophageal reflux disease)   . Hyperlipemia   . Hypertension   . IBS (irritable bowel syndrome) 08/13/2014   Dx 2015, diarrhea on-off, Dr [name]   . Lumbar radiculopathy 08/2012   MRI in 08/2012  . Mild depression ([place])     Pt's son has Down's Syndrome  . 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 Osteopenia   . PAF (paroxysmal atrial fibrillation) ([place])   . Prediabetes   . Sciatica of right side 08/2013   Received Depomedrol 80 mg injection  . Sleep apnea 09/09/2013   Dx with OSA in 2015, by Dr. Caryl Comes, APAP    PSHx Past Surgical History:  Procedure Laterality Date  . ABDOMINAL HYSTERECTOMY    . ATRIAL FIBRILLATION ABLATION N/A 09/04/2017   Procedure: ATRIAL FIBRILLATION ABLATION;  Surgeon: [name], MD;  Location: [facility];  Service: Cardiovascular;  Laterality: N/A;  . CARPAL TUNNEL RELEASE Right 06/17/2019  . CHOLECYSTECTOMY  ~2006  . DILATION AND CURETTAGE OF UTERUS     after SAB  . ESOPHAGOGASTRODUODENOSCOPY  02/09/2014   Dr. [name]  . FINGER SURGERY Left    index  . NASAL SINUS SURGERY     x 2 remotely, [name]  . TONSILLECTOMY    . TOTAL ABDOMINAL HYSTERECTOMY W/ BILATERAL SALPINGOOPHORECTOMY    . TOTAL KNEE ARTHROPLASTY Right ~2008    SocHx  reports that she quit smoking about 40 years ago. She has never used smokeless tobacco. She reports current alcohol use. She reports that she does not use drugs.  Allergies  Allergen Reactions  . Nsaids Other (See Comments)    Pt on Xarelto  . Penicillins  Local reaction  . Tolmetin Other (See Comments)    Pt on Xarelto  . Zoster Vaccine Live Other (See Comments)    Sickness per Pt.   Marland Kitchen Amoxicillin Rash    FamHx Family History  Problem Relation Age of Onset  . Hypertension Mother   . Colon cancer Mother   . Breast cancer Mother   . Ovarian cancer Mother   . Hypertension Father   . Diabetes Father   . Head & neck cancer Sister   . CAD Neg Hx     Prior to Admission medications   Medication Sig Start Date End Date Taking? Authorizing Provider  acetaminophen (TYLENOL) 325 MG tablet Take 325 mg by mouth as needed.    [provider]  acetaminophen-codeine (TYLENOL #3) 300-30 MG tablet Take 1 tablet by mouth daily  as needed for moderate pain. 12/29/19   Colon Branch, MD  Chromium Picolinate 1000 MCG TABS Take 1,000 mcg by mouth every evening.    [provider]  Coenzyme Q10 (CO Q 10) 100 MG CAPS Take 100 mg by mouth daily.     [provider]  Cranberry 600 MG TABS  12/02/17   [provider]  diltiazem (CARDIZEM) 30 MG tablet Take 1 tablet every 4 hours AS NEEDED for heart rate >100 as long as blood pressure >100. 11/06/18   [name], MD  ferrous sulfate 325 (65 FE) MG tablet Take 325 mg by mouth daily with breakfast.    [provider]  flecainide (TAMBOCOR) 50 MG tablet Take 1 tablet (50 mg total) by mouth 2 (two) times daily. 11/21/19   [name], MD  fluticasone (FLONASE) 50 MCG/ACT nasal spray Place 1 spray into both nostrils daily.    [provider]  furosemide (LASIX) 20 MG tablet Take 20 mg by mouth as needed for fluid.    [provider]  loperamide (IMODIUM A-D) 2 MG tablet Take 2 mg by mouth daily.    [provider]  Omega-3 Fatty Acids (FISH OIL) 1000 MG CAPS Take 1,000 mg by mouth daily.     [provider]  omeprazole (PRILOSEC) 20 MG capsule Take 20 mg by mouth daily.    [provider]  pravastatin (PRAVACHOL) 40 MG tablet Take 1 tablet (40 mg total) by mouth at bedtime. 10/15/19   Colon Branch, MD  Pyridoxine HCl (VITAMIN B6) 200 MG TABS  06/17/19   [provider]  rivaroxaban (XARELTO) 20 MG TABS tablet Take 1 tablet (20 mg total) by mouth daily with supper. 01/16/20   [name], MD  valsartan (DIOVAN) 320 MG tablet TAKE 1 TABLET(320 MG) BY MOUTH DAILY 02/23/20   Colon Branch, MD  vitamin B-12 (CYANOCOBALAMIN) 1000 MCG tablet Take 2,000 mcg by mouth every evening.     [provider]    Physical Exam: There were no vitals filed for this visit.  General: 75 y.o. female resting in chair in NAD Eyes: PERRL, normal sclera ENMT: Nares patent w/o discharge, orophaynx clear,  dentition normal, ears w/o discharge/lesions/ulcers Neck: Supple, trachea midline Cardiovascular: RRR, +S1, S2, no m/g/r, equal pulses throughout Respiratory: CTABL, no w/r/r, normal WOB GI: BS+, NDNT, no masses noted, no organomegaly noted MSK: No e/c/c Skin: No rashes, bruises, ulcerations noted Neuro: A&O x 3, no focal deficits Psyc: Appropriate interaction and affect, calm/cooperative  Labs on Admission: I have personally reviewed following labs and imaging studies  CBC: No results for input(s): WBC,  NEUTROABS, HGB, HCT, MCV, PLT in the last 168 hours. Basic Metabolic Panel: No results for input(s): NA, K, CL, CO2, GLUCOSE, BUN, CREATININE, CALCIUM, MG, PHOS in the last 168 hours. GFR: CrCl cannot be calculated (Patient's most recent lab result is older than the maximum 21 days allowed.). Liver Function Tests: No results for input(s): AST, ALT, ALKPHOS, BILITOT, PROT, ALBUMIN in the last 168 hours. No results for input(s): LIPASE, AMYLASE in the last 168 hours. No results for input(s): AMMONIA in the last 168 hours. Coagulation Profile: No results for input(s): INR, PROTIME in the last 168 hours. Cardiac Enzymes: No results for input(s): CKTOTAL, CKMB, CKMBINDEX, TROPONINI in the last 168 hours. BNP (last 3 results) No results for input(s): PROBNP in the last 8760 hours. HbA1C: No results for input(s): HGBA1C in the last 72 hours. CBG: No results for input(s): GLUCAP in the last 168 hours. Lipid Profile: No results for input(s): CHOL, HDL, LDLCALC, TRIG, CHOLHDL, LDLDIRECT in the last 72 hours. Thyroid Function Tests: No results for input(s): TSH, T4TOTAL, FREET4, T3FREE, THYROIDAB in the last 72 hours. Anemia Panel: No results for input(s): VITAMINB12, FOLATE, FERRITIN, TIBC, IRON, RETICCTPCT in the last 72 hours. Urine analysis:    Component Value Date/Time   COLORURINE YELLOW 05/02/2016 0951   APPEARANCEUR Cloudy (A) 05/02/2016 0951   LABSPEC 1.025 05/02/2016 0951    PHURINE 6.0 05/02/2016 0951   GLUCOSEU NEGATIVE 05/02/2016 0951   HGBUR NEGATIVE 05/02/2016 0951   BILIRUBINUR negative 09/05/2019 1352   KETONESUR negative 08/11/2019 0929   KETONESUR NEGATIVE 05/02/2016 0951   PROTEINUR Negative 09/05/2019 1352   PROTEINUR NEGATIVE 10/30/2006 0945   UROBILINOGEN 0.2 09/05/2019 1352   UROBILINOGEN 0.2 05/02/2016 0951   NITRITE negative 09/05/2019 1352   NITRITE NEGATIVE 05/02/2016 0951   LEUKOCYTESUR Negative 09/05/2019 1352    Radiological Exams on Admission: No results found.  Assessment/Plan N/V/D Unintentional wt loss     - admit to inpt, tele     - [facility] to follow     - checking lipase, CMP, cbc, c diff/GI PCR, chromogranin A     - if renal functio is ok, will order CT w/ IV and oral contrast     - hold xarelto for pending scope      - PRN anti-emetics  Paroxysmal A fib HTN     - hold anticoagulation     - continue fleccanide     - BP was soft in GI office; slowly start back BP meds  IBS     - as above  OSA     - continue CPAP at night  HLD     - continue statin  DVT prophylaxis: SCDs  Code Status: FULL  Family Communication: None at bedside  Consults called: [facility]   Status is: Inpatient  Remains inpatient appropriate because:Inpatient level of care appropriate due to severity of illness   Dispo: The patient is from: Home              Anticipated d/c is to: Home              Patient currently is not medically stable to d/c.   Difficult to place patient No  Jonnie Finner DO [facility]  If 7PM-7AM, please contact night-coverage [email or web address]  03/09/2020, 1:18 PM

## 2020-03-10 DIAGNOSIS — Z7901 Long term (current) use of anticoagulants: Secondary | ICD-10-CM

## 2020-03-10 DIAGNOSIS — E785 Hyperlipidemia, unspecified: Secondary | ICD-10-CM

## 2020-03-10 DIAGNOSIS — I5189 Other ill-defined heart diseases: Secondary | ICD-10-CM

## 2020-03-10 LAB — CBC
HCT: 45.3 % (ref 36.0–46.0)
Hemoglobin: 15 g/dL (ref 12.0–15.0)
MCH: 29.8 pg (ref 26.0–34.0)
MCHC: 33.1 g/dL (ref 30.0–36.0)
MCV: 90.1 fL (ref 80.0–100.0)
Platelets: 250 10*3/uL (ref 150–400)
RBC: 5.03 MIL/uL (ref 3.87–5.11)
RDW: 14.5 % (ref 11.5–15.5)
WBC: 7.2 10*3/uL (ref 4.0–10.5)
nRBC: 0 % (ref 0.0–0.2)

## 2020-03-10 LAB — COMPREHENSIVE METABOLIC PANEL
ALT: 19 U/L (ref 0–44)
AST: 26 U/L (ref 15–41)
Albumin: 2.7 g/dL — ABNORMAL LOW (ref 3.5–5.0)
Alkaline Phosphatase: 54 U/L (ref 38–126)
Anion gap: 6 (ref 5–15)
BUN: 6 mg/dL — ABNORMAL LOW (ref 8–23)
CO2: 22 mmol/L (ref 22–32)
Calcium: 9.4 mg/dL (ref 8.9–10.3)
Chloride: 108 mmol/L (ref 98–111)
Creatinine, Ser: 0.37 mg/dL — ABNORMAL LOW (ref 0.44–1.00)
GFR, Estimated: >60 mL/min (ref >60)
Glucose, Bld: 90 mg/dL (ref 70–99)
Potassium: 4.2 mmol/L (ref 3.5–5.1)
Sodium: 136 mmol/L (ref 135–145)
Total Bilirubin: 0.8 mg/dL (ref 0.3–1.2)
Total Protein: 4.4 g/dL — ABNORMAL LOW (ref 6.5–8.1)

## 2020-03-10 MED ORDER — PROSOURCE PLUS PO LIQD
30.0000 mL | Freq: Three times a day (TID) | ORAL | Status: DC
  Administered 2020-03-10: 30 mL via ORAL
  Filled 2020-03-10 (×5): qty 30

## 2020-03-10 MED ORDER — LACTATED RINGERS IV SOLN: INTRAVENOUS | Status: AC

## 2020-03-10 MED ORDER — BOOST / RESOURCE BREEZE PO LIQD CUSTOM
1.0000 | Freq: Two times a day (BID) | ORAL | Status: DC
  Administered 2020-03-10 – 2020-03-12 (×4): 1 via ORAL

## 2020-03-10 MED ORDER — IRBESARTAN 150 MG PO TABS
150.0000 mg | ORAL_TABLET | Freq: Every day | ORAL | Status: DC
Start: 2020-03-10 — End: 2020-03-12
  Administered 2020-03-10 – 2020-03-12 (×3): 150 mg via ORAL
  Filled 2020-03-10 (×2): qty 1

## 2020-03-10 MED ORDER — IRBESARTAN 150 MG PO TABS
300.0000 mg | ORAL_TABLET | Freq: Every day | ORAL | Status: DC
  Filled 2020-03-10: qty 2

## 2020-03-10 MED ORDER — ADULT MULTIVITAMIN W/MINERALS CH
1.0000 | ORAL_TABLET | Freq: Every day | ORAL | Status: DC
  Administered 2020-03-10 – 2020-03-12 (×3): 1 via ORAL
  Filled 2020-03-10 (×3): qty 1

## 2020-03-10 NOTE — Progress Notes (Signed)
[facility]                                                                              Patient Demographics  Veronica Bradford, is a 75 y.o. female, DOB - [birth year], WRU:[id number]  Admit date - 03/09/2020   Admitting Physician Jonnie Finner, DO  Outpatient Primary MD for the patient is Colon Branch, MD  Outpatient specialists:   LOS - 1  days   Medical records reviewed and are as summarized below:    No chief complaint on file.      Brief summary   Patient is a 75 year old female with history of IBS, obesity, paroxysmal A. fib on chronic anticoagulation, GERD, hypertension, grade 2 diastolic CHF, history of endometrial CA admitted directly to the hospital because of ongoing nausea vomiting diarrhea, weight loss.  Patient reported that she has IBS and typically has flares with several hours of voluminous diarrhea and vomiting followed by days of intermittent diarrhea.  She has used Imodium with improvement.  However for the last 2 weeks she has been having intractable nausea vomiting and diarrhea, unable to keep up.  Patient called her GI, was started on colestipol and hold fluids however with no significant improvement.  She was scheduled for CT however it could not be done until next week.  She also felt palpitations, saw GI in office and was found to have hypotension and tachycardia and was recommended ED for further work-up. Patient reported no hematochezia, melena, hematemesis.  He however has 16 pounds weight loss.   Assessment & Plan    Principal Problem: Intractable nausea & vomiting, diarrhea, underlying history of IBS -CT abdomen pelvis negative for any abdominal pathology. -Given several weeks history, unable to hold anything down, weight loss.  Has longstanding history of episodic nausea, vomiting and diarrhea -GI consulted, recommended EGD, colonoscopy on 3/10 -Follow C. difficile, stool studies.  If all work-up negative, will obtain GES -Placed on IV  fluid hydration  Active Problems: Dehydration -Due to hypovolemia from intractable nausea vomiting and diarrhea.  Continue IV fluids    HTN (hypertension), grade 2 diastolic dysfunction -BP currently stable  Paroxysmal atrial fibrillation on chronic anticoagulation -Heart rate currently controlled, continue flecainide - hold Xarelto for EGD, colonoscopy  OSA -Continue CPAP at night    Dyslipidemia -Continue statin  History of endometrial cancer ([place]), 2004 -Outpatient follow-up with gyne-onc  Obesity Estimated body mass index is 38.53 kg/m as calculated from the following:   Height as of this encounter: 5\' 7"  (1.702 m).   Weight as of this encounter: 111.6 kg.  Code Status: Full CODE STATUS DVT Prophylaxis:  SCDs Start: 03/09/20 1431   Level of Care: Level of care: Telemetry Family Communication: Discussed all imaging results, lab results, explained to the patient    Disposition Plan:     Status is: Inpatient  Remains inpatient appropriate because:IV treatments appropriate due to intensity of illness or inability to take PO   Dispo: The patient is from: Home              Anticipated d/c is to: Home  Patient currently is not medically stable to d/c.  Needs endoscopy, colonoscopy, GI work-up in progress   Difficult to place patient No      Time Spent in minutes 35 minutes  Procedures:  None  Consultants:   Gastroenterology  Antimicrobials:   Anti-infectives (From admission, onward)   None         Medications  Scheduled Meds: . (feeding supplement) PROSource Plus  30 mL Oral TID BM  . feeding supplement  1 Container Oral BID BM  . flecainide  50 mg Oral BID  . fluticasone  1 spray Each Nare Daily  . irbesartan  150 mg Oral Daily  . multivitamin with minerals  1 tablet Oral Daily  . omega-3 acid ethyl esters  1 g Oral Daily  . pravastatin  40 mg Oral QHS  . pyridOXINE  200 mg Oral Daily  . vitamin B-12  2,000 mcg Oral QPM    Continuous Infusions: PRN Meds:.acetaminophen **OR** acetaminophen, ondansetron (ZOFRAN) IV, polyvinyl alcohol      Subjective:   Veronica Bradford was seen and examined today.  States no diarrhea this morning.  No active nausea vomiting.  No chest pain or shortness of breath.  No acute events overnight.    Objective:   Vitals:   03/10/20 0109 03/10/20 0622 03/10/20 1037 03/10/20 1242  BP: (!) 141/59 (!) 157/66 126/67 (!) 109/47  Pulse: 65 (!) 58 62 64  Resp: 18 16  16   Temp: 98.1 F (36.7 C) 98.5 F (36.9 C)  97.8 F (36.6 C)  TempSrc: Oral Oral  Axillary  SpO2: 98% 99% 100% 99%  Weight:      Height:       No intake or output data in the 24 hours ending 03/10/20 1624   Wt Readings from Last 3 Encounters:  03/09/20 111.6 kg  02/05/20 115.2 kg  12/29/19 115.8 kg     Exam  General: Alert and oriented x 3, NAD  Cardiovascular: S1 S2 auscultated, no murmurs, RRR  Respiratory: Clear to auscultation bilaterally, no wheezing, rales or rhonchi  Gastrointestinal: Soft, nontender, nondistended, + bowel sounds  Ext: no pedal edema bilaterally  Neuro: no new FND's  Musculoskeletal: No digital cyanosis, clubbing  Skin: No rashes  Psych: Normal affect and demeanor, alert and oriented x3    Data Reviewed:  I have personally reviewed following labs and imaging studies  Micro Results Recent Results (from the past 240 hour(s))  SARS CORONAVIRUS 2 (TAT 6-24 HRS) Nasopharyngeal Nasopharyngeal Swab     Status: None   Collection Time: 03/09/20  2:00 PM   Specimen: Nasopharyngeal Swab  Result Value Ref Range Status   SARS Coronavirus 2 NEGATIVE NEGATIVE Final    Comment: (NOTE) SARS-CoV-2 target nucleic acids are NOT DETECTED.  The SARS-CoV-2 RNA is generally detectable in upper and lower respiratory specimens during the acute phase of infection. Negative results do not preclude SARS-CoV-2 infection, do not rule out co-infections with other pathogens, and should not be  used as the sole basis for treatment or other patient management decisions. Negative results must be combined with clinical observations, patient history, and epidemiological information. The expected result is Negative.  Fact Sheet for Patients: SugarRoll.be  Fact Sheet for Healthcare Providers: https://www.woods-mathews.com/  This test is not yet approved or cleared by the [place] FDA and  has been authorized for detection and/or diagnosis of SARS-CoV-2 by FDA under an Emergency Use Authorization (EUA). This EUA will remain  in effect (meaning this test can  be used) for the duration of the COVID-19 declaration under Se ction 564(b)(1) of the Act, 21 U.S.C. section 360bbb-3(b)(1), unless the authorization is terminated or revoked sooner.  Performed at [facility], [place] [address], [place], [place] [address]     Radiology Reports CT ABDOMEN PELVIS W CONTRAST  Result Date: 03/09/2020 CLINICAL DATA:  Abdominal pain with severe diarrhea EXAM: CT ABDOMEN AND PELVIS WITH CONTRAST TECHNIQUE: Multidetector CT imaging of the abdomen and pelvis was performed using the standard protocol following bolus administration of intravenous contrast. CONTRAST:  114mL OMNIPAQUE IOHEXOL 300 MG/ML  SOLN COMPARISON:  05/02/2016 FINDINGS: Lower chest: No acute abnormality. Hepatobiliary: No focal liver abnormality is seen. Status post cholecystectomy. No biliary dilatation. Pancreas: Unremarkable. No pancreatic ductal dilatation or surrounding inflammatory changes. Spleen: Normal in size without focal abnormality. Adrenals/Urinary Tract: Adrenal glands are within normal limits. Kidneys demonstrate normal enhancement pattern. Normal excretion is noted bilaterally. No renal calculi or obstructive changes are seen. The ureters are within normal limits. Bladder is decompressed. Stomach/Bowel: Scattered diverticular change of the colon is noted. No obstructive or  inflammatory changes are seen. The appendix is within normal limits. Small bowel and stomach are unremarkable. Vascular/Lymphatic: Aortic atherosclerosis. No enlarged abdominal or pelvic lymph nodes. Reproductive: Status post hysterectomy. No adnexal masses. Other: No free fluid is noted. Fat containing umbilical hernia is seen and stable. Musculoskeletal: Degenerative changes of lumbar spine are seen. IMPRESSION: Diverticulosis without diverticulitis. Stable umbilical fat containing hernia. No acute abnormality noted. Electronically Signed   By: Inez Catalina M.D.   On: 03/09/2020 22:29    Lab Data:  CBC: Recent Labs  Lab 03/09/20 1414 03/10/20 0649  WBC 9.7 7.2  NEUTROABS 5.4  --   HGB 15.2* 15.0  HCT 46.6* 45.3  MCV 89.6 90.1  PLT 262 621   Basic Metabolic Panel: Recent Labs  Lab 03/09/20 1414 03/10/20 0527  NA 135 136  K 3.3* 4.2  CL 104 108  CO2 25 22  GLUCOSE 103* 90  BUN 12 6*  CREATININE 0.62 0.37*  CALCIUM 10.0 9.4  MG 2.1  --    GFR: Estimated Creatinine Clearance: 81.9 mL/min (A) (by C-G formula based on SCr of 0.37 mg/dL (L)). Liver Function Tests: Recent Labs  Lab 03/09/20 1414 03/10/20 0527  AST 29 26  ALT 24 19  ALKPHOS 81 54  BILITOT 0.8 0.8  PROT 6.1* 4.4*  ALBUMIN 3.6 2.7*   Recent Labs  Lab 03/09/20 1414  LIPASE 25   No results for input(s): AMMONIA in the last 168 hours. Coagulation Profile: No results for input(s): INR, PROTIME in the last 168 hours. Cardiac Enzymes: No results for input(s): CKTOTAL, CKMB, CKMBINDEX, TROPONINI in the last 168 hours. BNP (last 3 results) No results for input(s): PROBNP in the last 8760 hours. HbA1C: No results for input(s): HGBA1C in the last 72 hours. CBG: No results for input(s): GLUCAP in the last 168 hours. Lipid Profile: No results for input(s): CHOL, HDL, LDLCALC, TRIG, CHOLHDL, LDLDIRECT in the last 72 hours. Thyroid Function Tests: No results for input(s): TSH, T4TOTAL, FREET4, T3FREE,  THYROIDAB in the last 72 hours. Anemia Panel: No results for input(s): VITAMINB12, FOLATE, FERRITIN, TIBC, IRON, RETICCTPCT in the last 72 hours. Urine analysis:    Component Value Date/Time   COLORURINE YELLOW 05/02/2016 0951   APPEARANCEUR Cloudy (A) 05/02/2016 0951   LABSPEC 1.025 05/02/2016 0951   PHURINE 6.0 05/02/2016 0951   GLUCOSEU NEGATIVE 05/02/2016 0951   HGBUR NEGATIVE 05/02/2016  [place] negative 09/05/2019 1352   KETONESUR negative 08/11/2019 0929   [place] 05/02/2016 0951   PROTEINUR Negative 09/05/2019 1352   PROTEINUR NEGATIVE 10/30/2006 0945   UROBILINOGEN 0.2 09/05/2019 1352   UROBILINOGEN 0.2 05/02/2016 0951   NITRITE negative 09/05/2019 1352   NITRITE NEGATIVE 05/02/2016 0951   LEUKOCYTESUR Negative 09/05/2019 1352     Alif Petrak M.D. [facility] 03/10/2020, 4:24 PM  Available via Epic secure chat 7am-7pm After 7 pm, please refer to night coverage provider listed on amion.

## 2020-03-10 NOTE — Progress Notes (Signed)
Patient had a fairly good night.  She has had minimal nausea, no vomiting.  She is tolerating clear liquids.  She has had 3 loose bowel movements over the past 12 hours.  CT scan negative for any cause of nausea and vomiting or diarrhea, such as bowel obstruction, mass, or inflammatory bowel disease.  BUN and creatinine have declined since admission; even though they were normal on admission this suggests that there was probably an element of volume contraction or dehydration as an outpatient.  The patient has not received IV fluids (there have been some problems with vascular access), but she has been drinking large amount of clear liquids, by her report.  CT scan negative for cause of symptoms, specifically no secretory tumor, bowel obstruction, etc.  Impression: Gradually resolving nausea, vomiting, and diarrhea of unclear cause  Plan: Proceed with endoscopy and colonoscopy early tomorrow morning as planned.  Continue to hold Xarelto.  Cleotis Nipper, M.D. Pager [phone] If no answer or after 5 PM call [phone]

## 2020-03-10 NOTE — Progress Notes (Signed)
Initial Nutrition Assessment  RD working remotely.  DOCUMENTATION CODES:   Obesity unspecified  INTERVENTION:  - will order Boost Breeze BID, each supplement provides 250 kcal and 9 grams of protein. - will order order 30 ml Prosource Plus TID, each supplement provides 100 kcal and 15 grams protein.  - will order 1 tablet multivitamin with minerals/day.  - diet advancement as medically feasible.  - complete NFPE when feasible.  NUTRITION DIAGNOSIS:   Inadequate oral intake related to acute illness,nausea,vomiting,diarrhea as evidenced by per patient/family report.  GOAL:   Patient will meet greater than or equal to 90% of their needs  MONITOR:   PO intake,Supplement acceptance,Diet advancement,Labs,Weight trends  REASON FOR ASSESSMENT:   Malnutrition Screening Tool  ASSESSMENT:   75 y.o. female with medical history of IBS, a fib, and HTN. She presented to the ED with N/V/D and palpatations. Imodium typically helps when she has IBS flare. She was unable to keep anything down for several days PTA.  RD is working off campus and patient is currently out of the room to CT. CLD ordered yesterday at 1430 and patient to be made NPO tonight at midnight. No intakes documented.   She has not been seen by a [place] RD at any time in the past.   Weight yesterday was 246 lb and weight on 02/05/20 was 253 lb. This indicates 7 lb weight loss (2.8% body weight) in the past 1 month; not significant for time frame.  Weight had been stable 08/13/19-02/05/20. No information documented in the edema section of flow sheet.   GI is following.   Labs reviewed; BUN: 6 mg/dl, creatinine: 0.37 mg/dl. Medications reviewed; 1 g lovaza/day, 40 mEq Klor-Con x1 dose 3/8 and x1 dose 3/9, 200 mg vitamin B6/day, 2000 mcg oral cyanocobalamin/day.     NUTRITION - FOCUSED PHYSICAL EXAM:  unable to complete at this time.   Diet Order:   Diet Order            Diet NPO time specified  Diet effective  midnight           Diet clear liquid Room service appropriate? Yes; Fluid consistency: Thin  Diet effective now                 EDUCATION NEEDS:   Not appropriate for education at this time  Skin:  Skin Assessment: Reviewed RN Assessment  Last BM:  3/8 (type 7 x1)  Height:   Ht Readings from Last 1 Encounters:  03/09/20 5\' 7"  (1.702 m)    Weight:   Wt Readings from Last 1 Encounters:  03/09/20 111.6 kg     Estimated Nutritional Needs:  Kcal:  1650-1850 kcal Protein:  80-95 grams Fluid:  >/= 3 L/day      Jarome Matin, MS, RD, LDN, CNSC Inpatient Clinical Dietitian RD pager # available in AMION  After hours/weekend pager # available in [facility]

## 2020-03-10 NOTE — Plan of Care (Signed)

## 2020-03-11 ENCOUNTER — Encounter (HOSPITAL_COMMUNITY)
Admission: AD | Disposition: A | Discharge status: Home / Self Care | Payer: Self-pay | Source: Ambulatory Visit | Attending: Internal Medicine

## 2020-03-11 ENCOUNTER — Inpatient Hospital Stay (HOSPITAL_COMMUNITY): Payer: Medicare Other | Admitting: Certified Registered Nurse Anesthetist

## 2020-03-11 ENCOUNTER — Encounter (HOSPITAL_COMMUNITY): Payer: Self-pay | Admitting: Internal Medicine

## 2020-03-11 HISTORY — PX: COLONOSCOPY WITH PROPOFOL: SHX5780

## 2020-03-11 HISTORY — PX: ESOPHAGOGASTRODUODENOSCOPY (EGD) WITH PROPOFOL: SHX5813

## 2020-03-11 HISTORY — PX: BIOPSY: SHX5522

## 2020-03-11 LAB — CBC
HCT: 38.3 % (ref 36.0–46.0)
Hemoglobin: 12.4 g/dL (ref 12.0–15.0)
MCH: 29.6 pg (ref 26.0–34.0)
MCHC: 32.4 g/dL (ref 30.0–36.0)
MCV: 91.4 fL (ref 80.0–100.0)
Platelets: 205 10*3/uL (ref 150–400)
RBC: 4.19 MIL/uL (ref 3.87–5.11)
RDW: 14.7 % (ref 11.5–15.5)
WBC: 6.6 10*3/uL (ref 4.0–10.5)
nRBC: 0 % (ref 0.0–0.2)

## 2020-03-11 LAB — BASIC METABOLIC PANEL
Anion gap: 4 — ABNORMAL LOW (ref 5–15)
BUN: <5 mg/dL — ABNORMAL LOW (ref 8–23)
CO2: 22 mmol/L (ref 22–32)
Calcium: 9.2 mg/dL (ref 8.9–10.3)
Chloride: 109 mmol/L (ref 98–111)
Creatinine, Ser: 0.5 mg/dL (ref 0.44–1.00)
GFR, Estimated: >60 mL/min (ref >60)
Glucose, Bld: 99 mg/dL (ref 70–99)
Potassium: 4.1 mmol/L (ref 3.5–5.1)
Sodium: 135 mmol/L (ref 135–145)

## 2020-03-11 LAB — CHROMOGRANIN A: Chromogranin A (ng/mL): 119.7 ng/mL — ABNORMAL HIGH (ref 0.0–101.8)

## 2020-03-11 SURGERY — ESOPHAGOGASTRODUODENOSCOPY (EGD) WITH PROPOFOL
Anesthesia: Monitor Anesthesia Care

## 2020-03-11 MED ORDER — LIDOCAINE 2% (20 MG/ML) 5 ML SYRINGE
INTRAMUSCULAR | Status: DC | PRN
  Administered 2020-03-11: 60 mg via INTRAVENOUS

## 2020-03-11 MED ORDER — PROPOFOL 10 MG/ML IV BOLUS
INTRAVENOUS | Status: AC
  Filled 2020-03-11: qty 20

## 2020-03-11 MED ORDER — RIVAROXABAN 20 MG PO TABS
20.0000 mg | ORAL_TABLET | Freq: Every day | ORAL | Status: DC
  Administered 2020-03-11: 20 mg via ORAL
  Filled 2020-03-11: qty 1

## 2020-03-11 MED ORDER — SUCRALFATE 1 G PO TABS
1.0000 g | ORAL_TABLET | ORAL | Status: DC
  Administered 2020-03-12 (×3): 1 g via ORAL
  Filled 2020-03-11 (×3): qty 1

## 2020-03-11 MED ORDER — PROPOFOL 1000 MG/100ML IV EMUL
INTRAVENOUS | Status: AC
  Filled 2020-03-11: qty 100

## 2020-03-11 MED ORDER — SUCRALFATE 1 G PO TABS
1.0000 g | ORAL_TABLET | Freq: Three times a day (TID) | ORAL | Status: DC
  Administered 2020-03-11: 1 g via ORAL

## 2020-03-11 MED ORDER — PRAVASTATIN SODIUM 40 MG PO TABS
40.0000 mg | ORAL_TABLET | Freq: Every evening | ORAL | Status: DC
  Administered 2020-03-11: 40 mg via ORAL
  Filled 2020-03-11: qty 1

## 2020-03-11 MED ORDER — PROPOFOL 500 MG/50ML IV EMUL
INTRAVENOUS | Status: DC | PRN
  Administered 2020-03-11: 100 ug/kg/min via INTRAVENOUS

## 2020-03-11 MED ORDER — SUCRALFATE 1 G PO TABS
1.0000 g | ORAL_TABLET | Freq: Four times a day (QID) | ORAL | Status: DC
  Filled 2020-03-11: qty 1

## 2020-03-11 MED ORDER — PANTOPRAZOLE SODIUM 40 MG PO TBEC
40.0000 mg | DELAYED_RELEASE_TABLET | Freq: Every day | ORAL | Status: DC
  Administered 2020-03-11 – 2020-03-12 (×2): 40 mg via ORAL
  Filled 2020-03-11 (×2): qty 1

## 2020-03-11 MED ORDER — ONDANSETRON HCL 4 MG/2ML IJ SOLN
INTRAMUSCULAR | Status: DC | PRN
  Administered 2020-03-11: 4 mg via INTRAVENOUS

## 2020-03-11 MED ORDER — PROPOFOL 10 MG/ML IV BOLUS
INTRAVENOUS | Status: DC | PRN
  Administered 2020-03-11 (×3): 10 mg via INTRAVENOUS
  Administered 2020-03-11: 30 mg via INTRAVENOUS

## 2020-03-11 SURGICAL SUPPLY — 25 items

## 2020-03-11 NOTE — Interval H&P Note (Signed)
History and Physical Interval Note:  03/11/2020 7:41 AM  [name]  has presented today for surgery, with the diagnosis of Nausea, vomiting, weight loss, diarrhea.  The various methods of treatment have been discussed with the patient . After consideration of risks, benefits and other options for treatment, the patient has consented to  Procedure(s): ESOPHAGOGASTRODUODENOSCOPY (EGD) WITH PROPOFOL (N/A) COLONOSCOPY WITH PROPOFOL (N/A) as a surgical intervention.  The patient's history has been reviewed, patient examined, no change in status, stable for surgery.  I have reviewed the patient's chart and labs.  Questions were answered to the patient's satisfaction.     Veronica Bradford

## 2020-03-11 NOTE — Transfer of Care (Signed)
Immediate Anesthesia Transfer of Care Note  Patient: Veronica Bradford  Procedure(s) Performed: ESOPHAGOGASTRODUODENOSCOPY (EGD) WITH PROPOFOL (N/A ) COLONOSCOPY WITH PROPOFOL (N/A ) BIOPSY  Patient Location: PACU and Endoscopy Unit  Anesthesia Type:MAC  Level of Consciousness: awake, alert  and oriented  Airway & Oxygen Therapy: Patient Spontanous Breathing and Patient connected to face mask oxygen  Post-op Assessment: Report given to RN and Post -op Vital signs reviewed and stable  Post vital signs: Reviewed and stable  Last Vitals:  Vitals Value Taken Time  BP    Temp    Pulse 72 03/11/20 0824  Resp 11 03/11/20 0824  SpO2 100 % 03/11/20 0824  Vitals shown include unvalidated device data.  Last Pain:  Vitals:   03/11/20 0710  TempSrc: Oral  PainSc: 0-No pain      Patients Stated Pain Goal: 0 (45/80/99 8338)  Complications: No complications documented.

## 2020-03-11 NOTE — Anesthesia Preprocedure Evaluation (Signed)
Anesthesia Evaluation  Patient identified by MRN, date of birth, ID band Patient awake    Reviewed: Allergy & Precautions, NPO status , Patient's Chart, lab work & pertinent test results  Airway Mallampati: II  TM Distance: >3 FB Neck ROM: Full    Dental no notable dental hx.    Pulmonary sleep apnea and Continuous Positive Airway Pressure Ventilation , former smoker,    Pulmonary exam normal breath sounds clear to auscultation       Cardiovascular hypertension, Normal cardiovascular exam+ dysrhythmias Atrial Fibrillation  Rhythm:Regular Rate:Normal     Neuro/Psych negative neurological ROS  negative psych ROS   GI/Hepatic Neg liver ROS, GERD  ,  Endo/Other  Morbid obesity  Renal/GU negative Renal ROS  negative genitourinary   Musculoskeletal negative musculoskeletal ROS (+)   Abdominal   Peds negative pediatric ROS (+)  Hematology negative hematology ROS (+)   Anesthesia Other Findings   Reproductive/Obstetrics negative OB ROS                             Anesthesia Physical Anesthesia Plan  ASA: III  Anesthesia Plan: MAC   Post-op Pain Management:    Induction: Intravenous  PONV Risk Score and Plan: 1 and Propofol infusion  Airway Management Planned: Simple Face Mask  Additional Equipment:   Intra-op Plan:   Post-operative Plan:   Informed Consent: I have reviewed the patients History and Physical, chart, labs and discussed the procedure including the risks, benefits and alternatives for the proposed anesthesia with the patient or authorized representative who has indicated his/her understanding and acceptance.     Dental advisory given  Plan Discussed with: CRNA and Surgeon  Anesthesia Plan Comments:         Anesthesia Quick Evaluation

## 2020-03-11 NOTE — Op Note (Signed)
[facility] Patient Name: Veronica Bradford Procedure Date: 03/11/2020 MRN: [id number] Attending MD: [name] , MD Date of Birth: May 17, 1945 CSN: [id number] Age: 75 Admit Type: Inpatient Procedure:                Colonoscopy Indications:              Last colonoscopy: November 2021, Clinically                            significant diarrhea of unexplained origin Providers:                [name], MD, Cleda Daub, RN, Laverda Sorenson, Technician, Virgia Land, CRNA Referring MD:              Medicines:                Monitored Anesthesia Care Complications:            No immediate complications. Estimated Blood Loss:     Estimated blood loss was minimal. Procedure:                Pre-Anesthesia Assessment:                           - Prior to the procedure, a History and Physical                            was performed, and patient medications and                            allergies were reviewed. The patient's tolerance of                            previous anesthesia was also reviewed. The risks                            and benefits of the procedure and the sedation                            options and risks were discussed with the patient.                            All questions were answered, and informed consent                            was obtained. Prior Anticoagulants: The patient has                            taken Xarelto (rivaroxaban), last dose was 2 days                            prior to procedure. ASA Grade Assessment: III - A  patient with severe systemic disease. After                            reviewing the risks and benefits, the patient was                            deemed in satisfactory condition to undergo the                            procedure.                           After obtaining informed consent, the colonoscope                            was passed under direct  vision. Throughout the                            procedure, the patient's blood pressure, pulse, and                            oxygen saturations were monitored continuously. The                            [id number]-[id number] ([id number] Olympus pediatric colonscope                            was introduced through the anus and advanced to the                            the cecum, identified by appendiceal orifice and                            ileocecal valve. The colonoscopy was somewhat                            difficult due to restricted mobility of the colon.                            The patient tolerated the procedure well. The                            quality of the bowel preparation was good. Scope In: 7:58:08 AM Scope Out: 8:17:00 AM Scope Withdrawal Time: 0 hours 13 minutes 35 seconds  Total Procedure Duration: 0 hours 18 minutes 52 seconds  Findings:      Hemorrhoids were found on perianal exam.      Multiple medium-mouthed diverticula were found in the sigmoid colon.      The exam was otherwise normal throughout the examined colon.      There is no endoscopic evidence of inflammation, mass, polyps or       angiodysplasia in the entire colon.      Biopsies for histology were taken with a cold forceps for evaluation of       microscopic colitis.      The  retroflexed view of the distal rectum and anal verge was normal and       showed no anal or rectal abnormalities. Impression:               - No cause for diarrhea endoscopically evident.                           - Hemorrhoids found on perianal exam.                           - Diverticulosis in the sigmoid colon.                           - The distal rectum and anal verge are normal on                            retroflexion view.                           - Biopsies were taken with a cold forceps for                            evaluation of microscopic colitis. Moderate Sedation:      This patient was sedated with monitored  anesthesia care, not moderate       sedation. Recommendation:           - Await pathology results.                           - Continue present medications. Procedure Code(s):        --- Professional ---                           [phone], Colonoscopy, flexible; with biopsy, single                            or multiple Diagnosis Code(s):        --- Professional ---                           R19.7, Diarrhea, unspecified                           K57.30, Diverticulosis of large intestine without                            perforation or abscess without bleeding CPT copyright 2019 American Medical Association. All rights reserved. The codes documented in this report are preliminary and upon coder review may  be revised to meet current compliance requirements. [name], MD 03/11/2020 8:33:10 AM This report has been signed electronically. Number of Addenda: 0

## 2020-03-11 NOTE — Progress Notes (Signed)
Patient completed CHG bath and consent signed for EGD.  Endoscopy nurse at bedside and patient taken for her procedure via wheelchair.  No c/o voiced prior to leaving.

## 2020-03-11 NOTE — Progress Notes (Signed)
[facility]                                                                              Patient Demographics  Veronica Bradford, is a 75 y.o. female, DOB - 1945/09/30, HKV:[id number]  Admit date - 03/09/2020   Admitting Physician Jonnie Finner, DO  Outpatient Primary MD for the patient is Colon Branch, MD  Outpatient specialists:   LOS - 2  days   Medical records reviewed and are as summarized below:    No chief complaint on file.      Brief summary   Patient is a 75 year old female with history of IBS, obesity, paroxysmal A. fib on chronic anticoagulation, GERD, hypertension, grade 2 diastolic CHF, history of endometrial CA admitted directly to the hospital because of ongoing nausea vomiting diarrhea, weight loss.  Patient reported that she has IBS and typically has flares with several hours of voluminous diarrhea and vomiting followed by days of intermittent diarrhea.  She has used Imodium with improvement.  However for the last 2 weeks she has been having intractable nausea vomiting and diarrhea, unable to keep up.  Patient called her GI, was started on colestipol and hold fluids however with no significant improvement.  She was scheduled for CT however it could not be done until next week.  She also felt palpitations, saw GI in office and was found to have hypotension and tachycardia and was recommended ED for further work-up. Patient reported no hematochezia, melena, hematemesis.  He however has 16 pounds weight loss.   Assessment & Plan    Principal Problem: Intractable nausea & vomiting, diarrhea, underlying history of IBS -CT abdomen pelvis negative for any abdominal pathology. -Given several weeks history, unable to hold anything down, weight loss.  Has longstanding history of episodic nausea, vomiting and diarrhea -EGD today showed bile reflux and moderate gastritis, started on PPI and Carafate -Started on soft diet.  If patient tolerates today, with no  recurrence of symptoms, likely will DC home in a.m.  Active Problems: Dehydration -Due to hypovolemia from intractable nausea vomiting and diarrhea.  Diarrhea improved     HTN (hypertension), grade 2 diastolic dysfunction -BP stable, continue Avapro  Paroxysmal atrial fibrillation on chronic anticoagulation -Heart rate currently controlled, continue flecainide -Resume Xarelto in the evening  OSA -Continue CPAP at night    Dyslipidemia -Continue statin  History of endometrial cancer ([place]), 2004 -Outpatient follow-up with gyne-onc  Obesity Estimated body mass index is 38.33 kg/m as calculated from the following:   Height as of this encounter: 5\' 7"  (1.702 m).   Weight as of this encounter: 111 kg.  Code Status: Full CODE STATUS DVT Prophylaxis:  SCDs Start: 03/09/20 1431   Level of Care: Level of care: Telemetry Family Communication: Discussed all imaging results, lab results, explained to the patient    Disposition Plan:     Status is: Inpatient  Remains inpatient appropriate because:IV treatments appropriate due to intensity of illness or inability to take PO   Dispo: The patient is from: Home              Anticipated d/c is to:  Home              Patient currently is not medically stable to d/c.  Hopefully DC home in a.m. if continues to tolerate diet    Difficult to place patient No      Time Spent in minutes 35 minutes  Procedures:  None  Consultants:   Gastroenterology  Antimicrobials:   Anti-infectives (From admission, onward)   None         Medications  Scheduled Meds: . (feeding supplement) PROSource Plus  30 mL Oral TID BM  . feeding supplement  1 Container Oral BID BM  . flecainide  50 mg Oral BID  . fluticasone  1 spray Each Nare Daily  . irbesartan  150 mg Oral Daily  . multivitamin with minerals  1 tablet Oral Daily  . omega-3 acid ethyl esters  1 g Oral Daily  . pantoprazole  40 mg Oral Daily  . pravastatin  40 mg Oral QPM   . pyridOXINE  200 mg Oral Daily  . rivaroxaban  20 mg Oral Q supper  . sucralfate  1 g Oral 4 times per day  . vitamin B-12  2,000 mcg Oral QPM   Continuous Infusions: . lactated ringers 75 mL/hr at 03/11/20 0644   PRN Meds:.acetaminophen **OR** acetaminophen, ondansetron (ZOFRAN) IV, polyvinyl alcohol      Subjective:   Vickii Volland was seen and examined today.  Seen after the endoscopy.,  Feeling cold.  States no diarrhea this morning.  No active nausea vomiting.  Wants to try soft diet.    Objective:   Vitals:   03/11/20 0840 03/11/20 0949 03/11/20 1218 03/11/20 1322  BP: 127/80 (!) 159/68 119/62 (!) 134/49  Pulse: (!) 59 (!) 57 66 71  Resp: 11 16  18   Temp:  (!) 97.4 F (36.3 C)  98 F (36.7 C)  TempSrc:  Oral  Oral  SpO2: 100% 100% 100% 99%  Weight:      Height:        Intake/Output Summary (Last 24 hours) at 03/11/2020 1608 Last data filed at 03/11/2020 1217 Gross per 24 hour  Intake 1667.14 ml  Output -  Net 1667.14 ml     Wt Readings from Last 3 Encounters:  03/11/20 111 kg  02/05/20 115.2 kg  12/29/19 115.8 kg   Physical Exam  General: Alert and oriented x 3, NAD  Cardiovascular: S1 S2 clear, RRR. No pedal edema b/l  Respiratory: CTAB  Gastrointestinal: Soft, nontender, nondistended, NBS  Ext: no pedal edema bilaterally  Neuro: no new deficits  Musculoskeletal: No cyanosis, clubbing  Skin: No rashes  Psych: Normal affect and demeanor, alert and oriented x3    Data Reviewed:  I have personally reviewed following labs and imaging studies  Micro Results Recent Results (from the past 240 hour(s))  SARS CORONAVIRUS 2 (TAT 6-24 HRS) Nasopharyngeal Nasopharyngeal Swab     Status: None   Collection Time: 03/09/20  2:00 PM   Specimen: Nasopharyngeal Swab  Result Value Ref Range Status   SARS Coronavirus 2 NEGATIVE NEGATIVE Final    Comment: (NOTE) SARS-CoV-2 target nucleic acids are NOT DETECTED.  The SARS-CoV-2 RNA is generally  detectable in upper and lower respiratory specimens during the acute phase of infection. Negative results do not preclude SARS-CoV-2 infection, do not rule out co-infections with other pathogens, and should not be used as the sole basis for treatment or other patient management decisions. Negative results must be combined with clinical observations, patient history,  and epidemiological information. The expected result is Negative.  Fact Sheet for Patients: SugarRoll.be  Fact Sheet for Healthcare Providers: https://www.woods-mathews.com/  This test is not yet approved or cleared by the [place] FDA and  has been authorized for detection and/or diagnosis of SARS-CoV-2 by FDA under an Emergency Use Authorization (EUA). This EUA will remain  in effect (meaning this test can be used) for the duration of the COVID-19 declaration under Se ction 564(b)(1) of the Act, 21 U.S.C. section 360bbb-3(b)(1), unless the authorization is terminated or revoked sooner.  Performed at [facility], [place] [address], [place], [place] [address]     Radiology Reports CT ABDOMEN PELVIS W CONTRAST  Result Date: 03/09/2020 CLINICAL DATA:  Abdominal pain with severe diarrhea EXAM: CT ABDOMEN AND PELVIS WITH CONTRAST TECHNIQUE: Multidetector CT imaging of the abdomen and pelvis was performed using the standard protocol following bolus administration of intravenous contrast. CONTRAST:  182mL OMNIPAQUE IOHEXOL 300 MG/ML  SOLN COMPARISON:  05/02/2016 FINDINGS: Lower chest: No acute abnormality. Hepatobiliary: No focal liver abnormality is seen. Status post cholecystectomy. No biliary dilatation. Pancreas: Unremarkable. No pancreatic ductal dilatation or surrounding inflammatory changes. Spleen: Normal in size without focal abnormality. Adrenals/Urinary Tract: Adrenal glands are within normal limits. Kidneys demonstrate normal enhancement pattern. Normal excretion is  noted bilaterally. No renal calculi or obstructive changes are seen. The ureters are within normal limits. Bladder is decompressed. Stomach/Bowel: Scattered diverticular change of the colon is noted. No obstructive or inflammatory changes are seen. The appendix is within normal limits. Small bowel and stomach are unremarkable. Vascular/Lymphatic: Aortic atherosclerosis. No enlarged abdominal or pelvic lymph nodes. Reproductive: Status post hysterectomy. No adnexal masses. Other: No free fluid is noted. Fat containing umbilical hernia is seen and stable. Musculoskeletal: Degenerative changes of lumbar spine are seen. IMPRESSION: Diverticulosis without diverticulitis. Stable umbilical fat containing hernia. No acute abnormality noted. Electronically Signed   By: Inez Catalina M.D.   On: 03/09/2020 22:29    Lab Data:  CBC: Recent Labs  Lab 03/09/20 1414 03/10/20 0649 03/11/20 0523  WBC 9.7 7.2 6.6  NEUTROABS 5.4  --   --   HGB 15.2* 15.0 12.4  HCT 46.6* 45.3 38.3  MCV 89.6 90.1 91.4  PLT 262 250 427   Basic Metabolic Panel: Recent Labs  Lab 03/09/20 1414 03/10/20 0527 03/11/20 0523  NA 135 136 135  K 3.3* 4.2 4.1  CL 104 108 109  CO2 25 22 22   GLUCOSE 103* 90 99  BUN 12 6* <5*  CREATININE 0.62 0.37* 0.50  CALCIUM 10.0 9.4 9.2  MG 2.1  --   --    GFR: Estimated Creatinine Clearance: 81.7 mL/min (by C-G formula based on SCr of 0.5 mg/dL). Liver Function Tests: Recent Labs  Lab 03/09/20 1414 03/10/20 0527  AST 29 26  ALT 24 19  ALKPHOS 81 54  BILITOT 0.8 0.8  PROT 6.1* 4.4*  ALBUMIN 3.6 2.7*   Recent Labs  Lab 03/09/20 1414  LIPASE 25   No results for input(s): AMMONIA in the last 168 hours. Coagulation Profile: No results for input(s): INR, PROTIME in the last 168 hours. Cardiac Enzymes: No results for input(s): CKTOTAL, CKMB, CKMBINDEX, TROPONINI in the last 168 hours. BNP (last 3 results) No results for input(s): PROBNP in the last 8760 hours. HbA1C: No  results for input(s): HGBA1C in the last 72 hours. CBG: No results for input(s): GLUCAP in the last 168 hours. Lipid Profile: No results for input(s): CHOL, HDL, LDLCALC,  TRIG, CHOLHDL, LDLDIRECT in the last 72 hours. Thyroid Function Tests: No results for input(s): TSH, T4TOTAL, FREET4, T3FREE, THYROIDAB in the last 72 hours. Anemia Panel: No results for input(s): VITAMINB12, FOLATE, FERRITIN, TIBC, IRON, RETICCTPCT in the last 72 hours. Urine analysis:    Component Value Date/Time   COLORURINE YELLOW 05/02/2016 0951   APPEARANCEUR Cloudy (A) 05/02/2016 0951   LABSPEC 1.025 05/02/2016 0951   PHURINE 6.0 05/02/2016 0951   GLUCOSEU NEGATIVE 05/02/2016 0951   HGBUR NEGATIVE 05/02/2016 0951   BILIRUBINUR negative 09/05/2019 1352   KETONESUR negative 08/11/2019 0929   KETONESUR NEGATIVE 05/02/2016 0951   PROTEINUR Negative 09/05/2019 1352   PROTEINUR NEGATIVE 10/30/2006 0945   UROBILINOGEN 0.2 09/05/2019 1352   UROBILINOGEN 0.2 05/02/2016 0951   NITRITE negative 09/05/2019 1352   NITRITE NEGATIVE 05/02/2016 0951   LEUKOCYTESUR Negative 09/05/2019 1352     Ripudeep Rai M.D. [facility] 03/11/2020, 4:08 PM  Available via Epic secure chat 7am-7pm After 7 pm, please refer to night coverage provider listed on amion.

## 2020-03-11 NOTE — Progress Notes (Signed)
Patient's endoscopy and colonoscopy were well-tolerated.  The endoscopy showed bile reflux and moderate gastritis, perhaps due to the bile.  Accordingly, I will start the patient on sucralfate.  Also, the patient points out that she is on omeprazole as an outpatient.  I will start pantoprazole here in the hospital.  The colonoscopy was normal except for known sigmoid diverticulosis, but random mucosal biopsies were obtained to check for microscopic colitis.  Recommendations:  1.  I will advance the patient to a soft diet 2.  From my standpoint, the patient can be restarted on her Xarelto this evening  3.  If the patient does reasonably well for the next 24 hours (that is, reasonable oral intake, no vomiting, and no severe diarrhea), I think discharge tomorrow would be reasonable.  I will take care of contacting the patient with her biopsy results when they are available. 4.  Start sucralfate and pantoprazole as discussed above (ordered).  I will plan to see the patient on rounds tomorrow.  Please call me if you have any questions.  Cleotis Nipper, M.D. Pager [phone] If no answer or after 5 PM call [phone]

## 2020-03-11 NOTE — Progress Notes (Signed)
Pt tolerated soft diet well, no c/o nausea or vomiting.  Jerene Pitch

## 2020-03-11 NOTE — Anesthesia Postprocedure Evaluation (Signed)
Anesthesia Post Note  Patient: [name]  Procedure(s) Performed: ESOPHAGOGASTRODUODENOSCOPY (EGD) WITH PROPOFOL (N/A ) COLONOSCOPY WITH PROPOFOL (N/A ) BIOPSY     Patient location during evaluation: PACU Anesthesia Type: MAC Level of consciousness: awake and alert Pain management: pain level controlled Vital Signs Assessment: post-procedure vital signs reviewed and stable Respiratory status: spontaneous breathing, nonlabored ventilation, respiratory function stable and patient connected to nasal cannula oxygen Cardiovascular status: stable and blood pressure returned to baseline Postop Assessment: no apparent nausea or vomiting Anesthetic complications: no   No complications documented.  Last Vitals:  Vitals:   03/11/20 0710 03/11/20 0825  BP: (!) 144/51 138/61  Pulse: 64 66  Resp: 16 16  Temp: 36.7 C 36.6 C  SpO2: 100% 100%    Last Pain:  Vitals:   03/11/20 0825  TempSrc: Oral  PainSc:                  Shavonn Convey S

## 2020-03-11 NOTE — Op Note (Signed)
[facility] Patient Name: Veronica Bradford Procedure Date: 03/11/2020 MRN: [id number] Attending MD: [name] , MD Date of Birth: 1945/05/18 CSN: [id number] Age: 75 Admit Type: Inpatient Procedure:                Upper GI endoscopy Indications:              Nausea with vomiting, Weight loss Providers:                [name], MD, Cleda Daub, RN, Laverda Sorenson, Technician, Virgia Land, CRNA Referring MD:              Medicines:                Monitored Anesthesia Care Complications:            No immediate complications. Estimated Blood Loss:     Estimated blood loss was minimal. Estimated blood                            loss was minimal. Procedure:                Pre-Anesthesia Assessment:                           - Prior to the procedure, a History and Physical                            was performed, and patient medications and                            allergies were reviewed. The patient's tolerance of                            previous anesthesia was also reviewed. The risks                            and benefits of the procedure and the sedation                            options and risks were discussed with the patient.                            All questions were answered, and informed consent                            was obtained. Prior Anticoagulants: The patient has                            taken Xarelto (rivaroxaban), last dose was 2 days                            prior to procedure. ASA Grade Assessment: III - A  patient with severe systemic disease. After                            reviewing the risks and benefits, the patient was                            deemed in satisfactory condition to undergo the                            procedure.                           After obtaining informed consent, the endoscope was                            passed under direct vision.  Throughout the                            procedure, the patient's blood pressure, pulse, and                            oxygen saturations were monitored continuously. The                            [id number]-H190 ([id number]) Olympus gastroscope was                            introduced through the mouth, and advanced to the                            second part of duodenum. The upper GI endoscopy was                            accomplished without difficulty. The patient                            tolerated the procedure well. Scope In: Scope Out: Findings:      The larynx was normal.      The examined esophagus was normal.      A 2 cm hiatal hernia was present.      Bilious fluid (moderate amount) was found on the greater curvature of       the stomach.      Diffuse moderately erythematous mucosa with bleeding (focal mucosal       hemorrhages) was found in the gastric antrum.      The cardia and gastric fundus were normal on retroflexion.      Biopsies were taken with a cold forceps in the gastric fundus and in the       gastric antrum for histology. Estimated blood loss was minimal.      The examined duodenum was normal. Biopsies were taken with a cold       forceps for histology. Impression:               - Normal larynx.                           -  Normal esophagus.                           - 2 cm hiatal hernia.                           - Bilious gastric fluid.                           - Erythematous mucosa in the antrum, suggestive of                            bile reflux gastritis.                           - Normal examined duodenum. Biopsied.                           - Biopsies were taken with a cold forceps for                            histology in the gastric fundus and in the gastric                            antrum. Moderate Sedation:      This patient was sedated with monitored anesthesia care, not moderate       sedation. Recommendation:           - Await  pathology results.                           - Use sucralfate tablets 1 gram PO QID for 1 month. Procedure Code(s):        --- Professional ---                           [id number], Esophagogastroduodenoscopy, flexible,                            transoral; with biopsy, single or multiple Diagnosis Code(s):        --- Professional ---                           K31.89, Other diseases of stomach and duodenum                           R11.2, Nausea with vomiting, unspecified                           R63.4, Abnormal weight loss CPT copyright 2019 American [employer] Association. All rights reserved. The codes documented in this report are preliminary and upon coder review may  be revised to meet current compliance requirements. [name], MD 03/11/2020 8:28:46 AM This report has been signed electronically. Number of Addenda: 0

## 2020-03-12 ENCOUNTER — Encounter (HOSPITAL_COMMUNITY): Payer: Self-pay | Admitting: Gastroenterology

## 2020-03-12 LAB — CBC
HCT: 38.2 % (ref 36.0–46.0)
Hemoglobin: 12.4 g/dL (ref 12.0–15.0)
MCH: 29.8 pg (ref 26.0–34.0)
MCHC: 32.5 g/dL (ref 30.0–36.0)
MCV: 91.8 fL (ref 80.0–100.0)
Platelets: 214 10*3/uL (ref 150–400)
RBC: 4.16 MIL/uL (ref 3.87–5.11)
RDW: 14.7 % (ref 11.5–15.5)
WBC: 7.5 10*3/uL (ref 4.0–10.5)
nRBC: 0 % (ref 0.0–0.2)

## 2020-03-12 LAB — BASIC METABOLIC PANEL
Anion gap: 7 (ref 5–15)
BUN: 10 mg/dL (ref 8–23)
CO2: 22 mmol/L (ref 22–32)
Calcium: 9.4 mg/dL (ref 8.9–10.3)
Chloride: 108 mmol/L (ref 98–111)
Creatinine, Ser: 0.52 mg/dL (ref 0.44–1.00)
GFR, Estimated: >60 mL/min (ref >60)
Glucose, Bld: 124 mg/dL — ABNORMAL HIGH (ref 70–99)
Potassium: 3.7 mmol/L (ref 3.5–5.1)
Sodium: 137 mmol/L (ref 135–145)

## 2020-03-12 LAB — SURGICAL PATHOLOGY

## 2020-03-12 MED ORDER — SUCRALFATE 1 G PO TABS: 1.0000 g | ORAL_TABLET | Freq: Three times a day (TID) | ORAL | 1 refills | Status: DC

## 2020-03-12 MED ORDER — PANTOPRAZOLE SODIUM 40 MG PO TBEC: 40.0000 mg | DELAYED_RELEASE_TABLET | Freq: Every day | ORAL | 3 refills | Status: DC

## 2020-03-12 NOTE — Progress Notes (Signed)
Biopsies from endoscopy and colonoscopy are still pending.  Clinically, the patient is doing well.  She had one, soft formed bowel movement yesterday, so the diarrhea has resolved.  From the upper tract perspective, she is tolerating a soft diet without vomiting.  Her minimally elevated chromogranin a level is not of concern.  It might be attributable to PPI therapy.  I explained all this to the patient in considerable detail and she seems adequately reassured.  Impression: Clinically resolved episodic/recurrent GI tract dysmotility leading to nausea, vomiting, and diarrhea  Plan  1.  Okay for discharge at this time.  2.  Patient already has follow-up scheduled in our office for several weeks from now.  She knows to call us in the meantime if there is symptomatic exacerbation.  3.  No specific dietary restrictions are required.  I would advise the patient to advance diet as tolerated.  4.  I will contact the patient with her biopsy results when they are available next week.  5.  In terms of medications, I would continue both sucralfate and pantoprazole for now.  6.  I have suggested that, if she becomes symptomatic with nausea/vomiting and/or diarrhea in the future, that she resort to frequent small aliquots of clear liquids and high-dose Imodium.  This might be sufficient to keep her out of the emergency room.  7.  Plan discussed with Dr. Tana Coast.  Cleotis Nipper, M.D. Pager [phone] If no answer or after 5 PM call [phone]

## 2020-03-12 NOTE — Care Management Important Message (Signed)
Important Message  Patient Details IM Letter given to the Patient. Name: Veronica Bradford MRN: [id number] Date of Birth: 1945/04/05   Medicare Important Message Given:  Yes     Kerin Salen 03/12/2020, 10:46 AM

## 2020-03-12 NOTE — Discharge Summary (Addendum)
Physician Discharge Summary   Patient ID: Veronica Bradford MRN: [id number] DOB/AGE: 01/23/1945 75 y.o.  Admit date: 03/09/2020 Discharge date: 03/12/2020  Primary Care Physician:  Colon Branch, MD   Recommendations for Outpatient Follow-up:  1. Follow up with PCP in 1-2 weeks 2. Patient to follow-up with GI in 2 weeks  Home Health: None, at baseline, ambulating without any difficulty Equipment/Devices:   Discharge Condition: stable  CODE STATUS: FULL    Diet recommendation: Heart healthy diet   Discharge Diagnoses:   Present on Admission: . Intractable nausea and vomiting secondary to moderate gastritis, bile reflux Diarrhea with underlying history of IBS Dehydration . Dyslipidemia . Endometrial cancer ([place]) . GERD (gastroesophageal reflux disease) . HTN (hypertension) . IBS (irritable bowel syndrome) . Obesity (BMI 30-39.9) . Paroxysmal atrial fibrillation ([facility]) . Sleep apnea-- Dr Maxwell Caul, on CPAP   Consults: Gastroenterology    Allergies:   Allergies  Allergen Reactions  . Lisinopril     Other reaction(s): cough  . Nsaids Other (See Comments)    Pt on Xarelto  . Penicillins     Local reaction  . Tolmetin Other (See Comments)    Pt on Xarelto  . Zoster Vaccine Live Other (See Comments)    Sickness per Pt.   Marland Kitchen Amoxicillin Rash     DISCHARGE MEDICATIONS: Allergies as of 03/12/2020      Reactions   Lisinopril    Other reaction(s): cough   Nsaids Other (See Comments)   Pt on Xarelto   Penicillins    Local reaction   Tolmetin Other (See Comments)   Pt on Xarelto   Zoster Vaccine Live Other (See Comments)   Sickness per Pt.    Amoxicillin Rash      Medication List    STOP taking these medications   acetaminophen-codeine 300-30 MG tablet Commonly known as: TYLENOL #3   omeprazole 20 MG capsule Commonly known as: PRILOSEC     TAKE these medications   acetaminophen 325 MG tablet Commonly known as: TYLENOL Take 325 mg by mouth every 6 (six)  hours as needed for moderate pain.   Chromium Picolinate 1000 MCG Tabs Take 1,000 mcg by mouth every evening.   Co Q 10 100 MG Caps Take 100 mg by mouth daily.   Cranberry 600 MG Tabs Take 600 mg by mouth daily.   diltiazem 30 MG tablet Commonly known as: Cardizem Take 1 tablet every 4 hours AS NEEDED for heart rate >100 as long as blood pressure >100. What changed:   how much to take  how to take this  when to take this  reasons to take this   ferrous sulfate 325 (65 FE) MG tablet Take 325 mg by mouth daily with breakfast.   Fish Oil 1000 MG Caps Take 1,000 mg by mouth daily.   flecainide 50 MG tablet Commonly known as: TAMBOCOR Take 1 tablet (50 mg total) by mouth 2 (two) times daily.   fluticasone 50 MCG/ACT nasal spray Commonly known as: FLONASE Place 1 spray into both nostrils daily.   furosemide 20 MG tablet Commonly known as: LASIX Take 20 mg by mouth daily as needed for fluid.   hydroxypropyl methylcellulose / hypromellose 2.5 % ophthalmic solution Commonly known as: ISOPTO TEARS / GONIOVISC Place 1 drop into both eyes 3 (three) times daily as needed for dry eyes.   loperamide 2 MG tablet Commonly known as: IMODIUM A-D Take 2 mg by mouth daily.   ondansetron 4 MG disintegrating tablet Commonly  known as: ZOFRAN-ODT Take 4 mg by mouth every 8 (eight) hours as needed for nausea or vomiting.   pantoprazole 40 MG tablet Commonly known as: PROTONIX Take 1 tablet (40 mg total) by mouth daily. Start taking on: March 13, 2020   pravastatin 40 MG tablet Commonly known as: PRAVACHOL Take 1 tablet (40 mg total) by mouth at bedtime.   Probiotic Tbec Take 1 tablet by mouth daily.   rivaroxaban 20 MG Tabs tablet Commonly known as: Xarelto Take 1 tablet (20 mg total) by mouth daily with supper.   sucralfate 1 g tablet Commonly known as: CARAFATE Take 1 tablet (1 g total) by mouth 4 (four) times daily -  with meals and at bedtime.   valsartan 320 MG  tablet Commonly known as: DIOVAN TAKE 1 TABLET(320 MG) BY MOUTH DAILY What changed: See the new instructions.   vitamin B-12 1000 MCG tablet Commonly known as: CYANOCOBALAMIN Take 2,000 mcg by mouth every evening.   Vitamin B6 200 MG Tabs Take 200 tablets by mouth daily.        Brief H and P: For complete details please refer to admission H and P, but in brief Patient is a 75 year old female with history of IBS, obesity, paroxysmal A. fib on chronic anticoagulation, GERD, hypertension, grade 2 diastolic CHF, history of endometrial CA admitted directly to the hospital because of ongoing nausea vomiting diarrhea, weight loss.  Patient reported that she has IBS and typically has flares with several hours of voluminous diarrhea and vomiting followed by days of intermittent diarrhea.  She has used Imodium with improvement.  However for the last 2 weeks she has been having intractable nausea vomiting and diarrhea, unable to keep up.  Patient called her GI, was started on colestipol and hold fluids however with no significant improvement.  She was scheduled for CT however it could not be done until next week.  She also felt palpitations, saw GI in office and was found to have hypotension and tachycardia and was recommended ED for further work-up. Patient reported no hematochezia, melena, hematemesis, however has 16 pounds weight loss.  Hospital Course:   Intractable nausea & vomiting, diarrhea, underlying history of IBS likely due to moderate gastritis -CT abdomen pelvis negative for any abdominal pathology. Several weeks history, unable to hold anything down, weight loss.  Has longstanding history of episodic nausea, vomiting and diarrhea -GI was consulted, underwent EGD which showed bile reflux and moderate gastritis, started on PPI and Carafate -Colonoscopy was essentially normal except diverticulosis in the sigmoid colon Chromogranin level was slightly elevated.  Discussed with  gastroenterology, Dr. [name], slight elevation is not concerning.  Will follow in the office. Patient has tolerated no solid diet, cleared to DC home.   Dehydration -Due to hypovolemia from intractable nausea vomiting and diarrhea.  - Diarrhea now improved     HTN (hypertension), grade 2 diastolic dysfunction -BP stable, continue valsartan  Paroxysmal atrial fibrillation on chronic anticoagulation -Heart rate currently controlled, continue flecainide -Continue Xarelto  OSA -Continue CPAP at night    Dyslipidemia -Continue statin  History of endometrial cancer ([place]), 2004 -Outpatient follow-up with gyne-onc  Obesity Estimated body mass index is 38.33 kg/m as calculated from the following:   Height as of this encounter: 5\' 7"  (1.702 m).   Weight as of this encounter: 111 kg    Day of Discharge S: No acute complaints, ambulating, tolerating diet.  Diarrhea improved.  BP (!) 125/54 (BP Location: Left Arm)   Pulse Marland Kitchen)  56   Temp 98.1 F (36.7 C) (Oral)   Resp 16   Ht 5\' 7"  (1.702 m)   Wt 111 kg   SpO2 99%   BMI 38.33 kg/m   Physical Exam: General: Alert and awake oriented x3 not in any acute distress. HEENT: anicteric sclera, pupils reactive to light and accommodation CVS: S1-S2 clear no murmur rubs or gallops Chest: clear to auscultation bilaterally, no wheezing rales or rhonchi Abdomen: soft nontender, nondistended, normal bowel sounds Extremities: no cyanosis, clubbing or edema noted bilaterally Neuro: Cranial nerves II-XII intact, no focal neurological deficits    Get Medicines reviewed and adjusted: Please take all your medications with you for your next visit with your Primary MD  Please request your Primary MD to go over all hospital tests and procedure/radiological results at the follow up. Please ask your Primary MD to get all Hospital records sent to his/her office.  If you experience worsening of your admission symptoms, develop shortness of  breath, life threatening emergency, suicidal or homicidal thoughts you must seek medical attention immediately by calling 911 or calling your MD immediately  if symptoms less severe.  You must read complete instructions/literature along with all the possible adverse reactions/side effects for all the Medicines you take and that have been prescribed to you. Take any new Medicines after you have completely understood and accept all the possible adverse reactions/side effects.   Do not drive when taking pain medications.   Do not take more than prescribed Pain, Sleep and Anxiety Medications  Special Instructions: If you have smoked or chewed Tobacco  in the last 2 yrs please stop smoking, stop any regular Alcohol  and or any Recreational drug use.  Wear Seat belts while driving.  Please note  You were cared for by a hospitalist during your hospital stay. Once you are discharged, your primary care physician will handle any further medical issues. Please note that NO REFILLS for any discharge medications will be authorized once you are discharged, as it is imperative that you return to your primary care physician (or establish a relationship with a primary care physician if you do not have one) for your aftercare needs so that they can reassess your need for medications and monitor your lab values.   The results of significant diagnostics from this hospitalization (including imaging, microbiology, ancillary and laboratory) are listed below for reference.      Procedures/Studies:  CT ABDOMEN PELVIS W CONTRAST  Result Date: 03/09/2020 CLINICAL DATA:  Abdominal pain with severe diarrhea EXAM: CT ABDOMEN AND PELVIS WITH CONTRAST TECHNIQUE: Multidetector CT imaging of the abdomen and pelvis was performed using the standard protocol following bolus administration of intravenous contrast. CONTRAST:  119mL OMNIPAQUE IOHEXOL 300 MG/ML  SOLN COMPARISON:  05/02/2016 FINDINGS: Lower chest: No acute  abnormality. Hepatobiliary: No focal liver abnormality is seen. Status post cholecystectomy. No biliary dilatation. Pancreas: Unremarkable. No pancreatic ductal dilatation or surrounding inflammatory changes. Spleen: Normal in size without focal abnormality. Adrenals/Urinary Tract: Adrenal glands are within normal limits. Kidneys demonstrate normal enhancement pattern. Normal excretion is noted bilaterally. No renal calculi or obstructive changes are seen. The ureters are within normal limits. Bladder is decompressed. Stomach/Bowel: Scattered diverticular change of the colon is noted. No obstructive or inflammatory changes are seen. The appendix is within normal limits. Small bowel and stomach are unremarkable. Vascular/Lymphatic: Aortic atherosclerosis. No enlarged abdominal or pelvic lymph nodes. Reproductive: Status post hysterectomy. No adnexal masses. Other: No free fluid is noted. Fat containing umbilical hernia  is seen and stable. Musculoskeletal: Degenerative changes of lumbar spine are seen. IMPRESSION: Diverticulosis without diverticulitis. Stable umbilical fat containing hernia. No acute abnormality noted. Electronically Signed   By: Inez Catalina M.D.   On: 03/09/2020 22:29       LAB RESULTS: Basic Metabolic Panel: Recent Labs  Lab 03/09/20 1414 03/10/20 0527 03/11/20 0523 03/12/20 0542  NA 135   < > 135 137  K 3.3*   < > 4.1 3.7  CL 104   < > 109 108  CO2 25   < > 22 22  GLUCOSE 103*   < > 99 124*  BUN 12   < > <5* 10  CREATININE 0.62   < > 0.50 0.52  CALCIUM 10.0   < > 9.2 9.4  MG 2.1  --   --   --    < > = values in this interval not displayed.   Liver Function Tests: Recent Labs  Lab 03/09/20 1414 03/10/20 0527  AST 29 26  ALT 24 19  ALKPHOS 81 54  BILITOT 0.8 0.8  PROT 6.1* 4.4*  ALBUMIN 3.6 2.7*   Recent Labs  Lab 03/09/20 1414  LIPASE 25   No results for input(s): AMMONIA in the last 168 hours. CBC: Recent Labs  Lab 03/09/20 1414 03/10/20 0649  03/11/20 0523 03/12/20 0542  WBC 9.7   < > 6.6 7.5  NEUTROABS 5.4  --   --   --   HGB 15.2*   < > 12.4 12.4  HCT 46.6*   < > 38.3 38.2  MCV 89.6   < > 91.4 91.8  PLT 262   < > 205 214   < > = values in this interval not displayed.   Cardiac Enzymes: No results for input(s): CKTOTAL, CKMB, CKMBINDEX, TROPONINI in the last 168 hours. BNP: Invalid input(s): POCBNP CBG: No results for input(s): GLUCAP in the last 168 hours.     Disposition and Follow-up: Discharge Instructions    Diet - low sodium heart healthy   Complete by: As directed    Increase activity slowly   Complete by: As directed        DISPOSITION: Home   DISCHARGE FOLLOW-UP  Follow-up Information    Buccini, Robert, MD. Schedule an appointment as soon as possible for a visit in 2 week(s).   Specialty: Gastroenterology Contact information: [address] [place] [address] [phone]        Colon Branch, MD. Schedule an appointment as soon as possible for a visit in 2 week(s).   Specialty: Internal Medicine Contact information: [address] [place] [address] [phone]                Time coordinating discharge:  35 minutes  Signed:   Estill Cotta M.D. [facility] 03/12/2020, 2:04 PM

## 2020-03-15 ENCOUNTER — Telehealth: Payer: Self-pay

## 2020-03-15 NOTE — Telephone Encounter (Signed)
Transition Care Management Unsuccessful Follow-up Telephone Call  Date of discharge and from where: 03/12/2020-[facility]  Attempts:  1st Attempt  Reason for unsuccessful TCM follow-up call:  Left voice message

## 2020-03-16 ENCOUNTER — Telehealth: Payer: Self-pay

## 2020-03-16 NOTE — Telephone Encounter (Signed)
Transition Care Management Unsuccessful Follow-up Telephone Call  Date of discharge and from where:  03/12/2020-[facility]  Attempts:  3rd Attempt  Reason for unsuccessful TCM follow-up call:  No answer/busy

## 2020-03-22 ENCOUNTER — Encounter: Payer: Self-pay | Admitting: Internal Medicine

## 2020-03-22 ENCOUNTER — Other Ambulatory Visit: Payer: Medicare Other

## 2020-03-26 ENCOUNTER — Ambulatory Visit (INDEPENDENT_AMBULATORY_CARE_PROVIDER_SITE_OTHER): Payer: Medicare Other | Admitting: Internal Medicine

## 2020-03-26 ENCOUNTER — Other Ambulatory Visit: Payer: Self-pay

## 2020-03-26 ENCOUNTER — Other Ambulatory Visit: Payer: Self-pay | Admitting: Internal Medicine

## 2020-03-26 VITALS — BP 131/78 | HR 70 | Temp 97.9°F | Ht 67.0 in | Wt 248.0 lb

## 2020-03-26 DIAGNOSIS — R197 Diarrhea, unspecified: Secondary | ICD-10-CM

## 2020-03-26 DIAGNOSIS — E213 Hyperparathyroidism, unspecified: Secondary | ICD-10-CM | POA: Diagnosis not present

## 2020-03-26 DIAGNOSIS — I10 Essential (primary) hypertension: Secondary | ICD-10-CM

## 2020-03-26 DIAGNOSIS — R112 Nausea with vomiting, unspecified: Secondary | ICD-10-CM

## 2020-03-26 MED ORDER — TRIAMTERENE-HCTZ 37.5-25 MG PO TABS: 0.5000 | ORAL_TABLET | Freq: Every day | ORAL | 0 refills | Status: DC

## 2020-03-26 NOTE — Progress Notes (Signed)
Subjective:    Patient ID: Veronica Bradford, female    DOB: 11/20/1945, 75 y.o.   MRN: [id number]  DOS:  03/26/2020 Type of visit - description: Hospital follow-up, TCM 14  Admitted to hospital 03/10/2019, discharged 03/12/2020. Presented with intractable nausea and vomiting as well as diarrhea,  was dehydrated. CT abdomen with no acute pathology, had a EGD that showed bile reflux and moderate gastritis, started PPIs and Carafate. Colonoscopy essentially negative except for diverticulosis. Chromogranin slightly elevated but not of a major concern per GI. Subsequently with conservative treatment she improved and was discharged home.   Review of Systems Since he left the hospital, he continue with some watery diarrhea.  Some nausea. Has changed her diet and remove all diary and is somewhat better. Nausea has also improved.  Denies fever chills No chest pain or difficulty breathing Lower extremity edema at baseline. No blood in the stools No heartburn  Past Medical History:  Diagnosis Date  . Atrial fibrillation ([facility])   . Back pain   . Chronic sinusitis    s/p 2 surgeries remotely, [name]  . Closed fracture of metatarsal of left foot    L foot, fifth metatarsal  . Ectopic pregnancy   . Endometrial cancer ([facility]) 2004   Early diagnosis  . GERD (gastroesophageal reflux disease)   . Hyperlipemia   . Hypertension   . IBS (irritable bowel syndrome) 08/13/2014   Dx 2015, diarrhea on-off, Dr [name]   . Lumbar radiculopathy 08/2012   MRI in 08/2012  . Mild hyperparathyroidism ([facility])   . Osteopenia   . PAF (paroxysmal atrial fibrillation) ([facility] [facility])   . Prediabetes   . Sciatica of right side 08/2013   Received Depomedrol 80 mg injection  . Sleep apnea 09/09/2013   Dx with OSA in 2015, by Dr. Caryl Comes, APAP    Past Surgical History:  Procedure Laterality Date  . ABDOMINAL HYSTERECTOMY    . ATRIAL FIBRILLATION ABLATION N/A 09/04/2017   Procedure: ATRIAL FIBRILLATION ABLATION;  Surgeon:  [name], MD;  Location: [facility];  Service: Cardiovascular;  Laterality: N/A;  . BIOPSY  03/11/2020   Procedure: BIOPSY;  Surgeon: [name], MD;  Location: [facility];  Service: Endoscopy;;  EGD and COLON  . CARPAL TUNNEL RELEASE Right 06/17/2019  . CHOLECYSTECTOMY  ~2006  . COLONOSCOPY WITH PROPOFOL N/A 03/11/2020   Procedure: COLONOSCOPY WITH PROPOFOL;  Surgeon: [name], MD;  Location: [facility];  Service: Endoscopy;  Laterality: N/A;  . DILATION AND CURETTAGE OF UTERUS     after SAB  . ESOPHAGOGASTRODUODENOSCOPY  02/09/2014   Dr. [name]  . ESOPHAGOGASTRODUODENOSCOPY (EGD) WITH PROPOFOL N/A 03/11/2020   Procedure: ESOPHAGOGASTRODUODENOSCOPY (EGD) WITH PROPOFOL;  Surgeon: [name], MD;  Location: [facility];  Service: Endoscopy;  Laterality: N/A;  . FINGER SURGERY Left    index  . NASAL SINUS SURGERY     x 2 remotely, [name]  . TONSILLECTOMY    . TOTAL ABDOMINAL HYSTERECTOMY W/ BILATERAL SALPINGOOPHORECTOMY    . TOTAL KNEE ARTHROPLASTY Right ~2008    Allergies as of 03/26/2020      Reactions   Lisinopril    Other reaction(s): cough   Nsaids Other (See Comments)   Pt on Xarelto   Penicillins    Local reaction   Tolmetin Other (See Comments)   Pt on Xarelto   Zoster Vaccine Live Other (See Comments)   Sickness per Pt.    Amoxicillin Rash      Medication List  Accurate as of March 26, 2020 11:59 PM. If you have any questions, ask your nurse or doctor.        STOP taking these medications   valsartan 320 MG tablet Commonly known as: DIOVAN Stopped by: Kathlene November, MD     TAKE these medications   acetaminophen 325 MG tablet Commonly known as: TYLENOL Take 325 mg by mouth every 6 (six) hours as needed for moderate pain.   acetaminophen-codeine 300-30 MG tablet Commonly known as: TYLENOL #3 Take 1 tablet by mouth daily as needed for moderate pain.   Chromium Picolinate 1000 MCG Tabs Take 1,000 mcg by mouth every  evening.   Co Q 10 100 MG Caps Take 100 mg by mouth daily.   colestipol 1 g tablet Commonly known as: COLESTID Take 1 g by mouth 2 (two) times daily.   Cranberry 600 MG Tabs Take 600 mg by mouth daily.   diltiazem 30 MG tablet Commonly known as: Cardizem Take 1 tablet every 4 hours AS NEEDED for heart rate >100 as long as blood pressure >100. What changed:   how much to take  how to take this  when to take this  reasons to take this   ferrous sulfate 325 (65 FE) MG tablet Take 325 mg by mouth daily with breakfast.   Fish Oil 1000 MG Caps Take 1,000 mg by mouth daily.   flecainide 50 MG tablet Commonly known as: TAMBOCOR Take 1 tablet (50 mg total) by mouth 2 (two) times daily.   fluticasone 50 MCG/ACT nasal spray Commonly known as: FLONASE Place 1 spray into both nostrils daily.   furosemide 20 MG tablet Commonly known as: LASIX Take 20 mg by mouth daily as needed for fluid.   hydroxypropyl methylcellulose / hypromellose 2.5 % ophthalmic solution Commonly known as: ISOPTO TEARS / GONIOVISC Place 1 drop into both eyes 3 (three) times daily as needed for dry eyes.   loperamide 2 MG tablet Commonly known as: IMODIUM A-D Take 2 mg by mouth daily.   ondansetron 4 MG disintegrating tablet Commonly known as: ZOFRAN-ODT Take 4 mg by mouth every 8 (eight) hours as needed for nausea or vomiting.   pantoprazole 40 MG tablet Commonly known as: PROTONIX Take 1 tablet (40 mg total) by mouth daily.   pravastatin 40 MG tablet Commonly known as: PRAVACHOL Take 1 tablet (40 mg total) by mouth at bedtime.   Probiotic Tbec Take 1 tablet by mouth daily.   rivaroxaban 20 MG Tabs tablet Commonly known as: Xarelto Take 1 tablet (20 mg total) by mouth daily with supper.   sucralfate 1 g tablet Commonly known as: CARAFATE Take 1 tablet (1 g total) by mouth 4 (four) times daily -  with meals and at bedtime.   triamterene-hydrochlorothiazide 37.5-25 MG tablet Commonly  known as: MAXZIDE-25 Take 0.5 tablets by mouth daily. Started by: Kathlene November, MD   vitamin B-12 1000 MCG tablet Commonly known as: CYANOCOBALAMIN Take 2,000 mcg by mouth every evening.   Vitamin B6 200 MG Tabs Take 200 tablets by mouth daily.          Objective:   Physical Exam BP 131/78 (BP Location: Right Arm, Patient Position: Sitting, Cuff Size: Large)   Pulse 70   Temp 97.9 F (36.6 C) (Temporal)   Ht 5\' 7"  (1.702 m)   Wt 248 lb (112.5 kg)   SpO2 99%   BMI 38.84 kg/m  General: Well developed, NAD, BMI noted HEENT:  Normocephalic . Face symmetric,  atraumatic Lungs:  CTA B Normal respiratory effort, no intercostal retractions, no accessory muscle use. Heart: RRR,  no murmur.  Abdomen:  Not distended, soft, non-tender. No rebound or rigidity.   Lower extremities: no pretibial edema bilaterally  Skin: Exposed areas without rash. Not pale. Not jaundice Neurologic:  alert & oriented X3.  Speech normal, gait appropriate for age and unassisted Strength symmetric and appropriate for age.  Psych: Cognition and judgment appear intact.  Cooperative with normal attention span and concentration.  Behavior appropriate.    Assessment      Assessment: Prediabetes HTN Hyperlipidemia Chronic lower extremity edema L>R (eval by previous pcp, related to varicose veins?) CV:  --P.  Atrial fibrillation, A. flutter: on xarelto flecainide-diltiazem  prn --Ablation 09/2017 GI: Dr [name]  --GERD (zantac or protonix prn), IBS, chronic diarrhea --GI records: [facility], 2004, 2009 8 and 07-2013. Negative for polyps or IBD. + Diverticuli. Next 202 due to [place]. Had a EGD/cscope 11/2016 d --Increased LFTs: CT abdomen 05/2016: Normal liver. 05/2017: WNL  Hepatitis B and C, Alpha 1 antitrypsin, anti-smooth muscle antibody, ANA trans-ferritin  OSA--- CPAP, Dr. Maxwell Caul Osteopenia:  Multiple  Dexas, last two:  T score 2009  -1.1; T score 2015  -0.7; T score 05/2017  -1.8 DJD --Chronic back pain-- tylenol #3 prn --- RF per PCP --history of epidurals in the past, MRI 2014: Scoliosis,DJD --CIPRO pre dental d/t knee replacement (rx by ortho) H/o endometrial cancer, found during a hysterectomy, no chemotherapy or XRT.  Primary hyperparathyroidism: Chronic hypercalcemia, increased PTH 05/2016, refered to ENT  PLAN:  TCM 14 Nausea, vomiting, diarrhea: Acute symptoms in the context of IBS and chronic diarrhea. Admitted to the hospital,  discharge summary reviewed, see above. PPIs increase from omeprazole to Protonix 40, she is also taking Carafate and colestipol which is helping. Has changed her diet and eliminate diarrhea. Will see GI soon. She suspects valsartan may be the culprit of some of her sxs request to stop it.  See next HTN:  She self decrease valsartan 320 mg to half tablet daily because her BP was too low, BPs remain controlled. Also, suspects  her chronic diarrhea may be related to valsartan and requests a change. I am not convinced that valsartan is the culprit but we agreed on: Stop valsartan, Maxide half tablet daily, BMP and CBC next week, monitor BPs. Primary hyperparathyroidism: Has not seen surgery in a while, likes to proceed, referral sent noting that her last calcium levels have been normal. RTC labs next week RTC ROV 2 months    This visit occurred during the SARS-CoV-2 public health emergency.  Safety protocols were in place, including screening questions prior to the visit, additional usage of staff PPE, and extensive cleaning of exam room while observing appropriate contact time as indicated for disinfecting solutions.

## 2020-03-26 NOTE — Patient Instructions (Addendum)
Stop valsartan  Start Maxide half tablet daily every morning  Still okay to take occasional Lasix  Check the  blood pressure daily BP GOAL is between 110/65 and  135/85. If it is consistently higher or lower, let me know      [place], [place] back for blood work next week  Come back for checkup in 2 months

## 2020-03-28 NOTE — Assessment & Plan Note (Signed)
TCM 14 Nausea, vomiting, diarrhea: Acute symptoms in the context of IBS and chronic diarrhea. Admitted to the hospital,  discharge summary reviewed, see above. PPIs increase from omeprazole to Protonix 40, she is also taking Carafate and colestipol which is helping. Has changed her diet and eliminate diarrhea. Will see GI soon. She suspects valsartan may be the culprit of some of her sxs request to stop it.  See next HTN:  She self decrease valsartan 320 mg to half tablet daily because her BP was too low, BPs remain controlled. Also, suspects  her chronic diarrhea may be related to valsartan and requests a change. I am not convinced that valsartan is the culprit but we agreed on: Stop valsartan, Maxide half tablet daily, BMP and CBC next week, monitor BPs. Primary hyperparathyroidism: Has not seen surgery in a while, likes to proceed, referral sent noting that her last calcium levels have been normal. RTC labs next week RTC ROV 2 months

## 2020-03-30 DIAGNOSIS — R112 Nausea with vomiting, unspecified: Secondary | ICD-10-CM | POA: Diagnosis not present

## 2020-03-30 DIAGNOSIS — R197 Diarrhea, unspecified: Secondary | ICD-10-CM | POA: Diagnosis not present

## 2020-03-31 ENCOUNTER — Other Ambulatory Visit: Payer: Medicare Other

## 2020-04-05 ENCOUNTER — Other Ambulatory Visit: Payer: Self-pay

## 2020-04-05 ENCOUNTER — Other Ambulatory Visit (INDEPENDENT_AMBULATORY_CARE_PROVIDER_SITE_OTHER): Payer: Medicare Other

## 2020-04-05 DIAGNOSIS — R197 Diarrhea, unspecified: Secondary | ICD-10-CM

## 2020-04-05 LAB — CBC WITH DIFFERENTIAL/PLATELET
Basophils Absolute: 0.1 10*3/uL (ref 0.0–0.1)
Basophils Relative: 1.1 % (ref 0.0–3.0)
Eosinophils Absolute: 0.2 10*3/uL (ref 0.0–0.7)
Eosinophils Relative: 4.3 % (ref 0.0–5.0)
HCT: 40.7 % (ref 36.0–46.0)
Hemoglobin: 13.7 g/dL (ref 12.0–15.0)
Lymphocytes Relative: 25.8 % (ref 12.0–46.0)
Lymphs Abs: 1.3 10*3/uL (ref 0.7–4.0)
MCHC: 33.7 g/dL (ref 30.0–36.0)
MCV: 87.8 fl (ref 78.0–100.0)
Monocytes Absolute: 0.5 10*3/uL (ref 0.1–1.0)
Monocytes Relative: 10.5 % (ref 3.0–12.0)
Neutro Abs: 3 10*3/uL (ref 1.4–7.7)
Neutrophils Relative %: 58.3 % (ref 43.0–77.0)
Platelets: 255 10*3/uL (ref 150.0–400.0)
RBC: 4.64 Mil/uL (ref 3.87–5.11)
RDW: 14.7 % (ref 11.5–15.5)
WBC: 5.2 10*3/uL (ref 4.0–10.5)

## 2020-04-05 LAB — BASIC METABOLIC PANEL
BUN: 10 mg/dL (ref 6–23)
CO2: 25 mEq/L (ref 19–32)
Calcium: 10.2 mg/dL (ref 8.4–10.5)
Chloride: 105 mEq/L (ref 96–112)
Creatinine, Ser: 0.58 mg/dL (ref 0.40–1.20)
GFR: 90.49 mL/min (ref >60.00)
Glucose, Bld: 98 mg/dL (ref 70–99)
Potassium: 3.6 mEq/L (ref 3.5–5.1)
Sodium: 138 mEq/L (ref 135–145)

## 2020-04-19 ENCOUNTER — Other Ambulatory Visit: Payer: Self-pay | Admitting: Internal Medicine

## 2020-04-19 DIAGNOSIS — E210 Primary hyperparathyroidism: Secondary | ICD-10-CM | POA: Diagnosis not present

## 2020-04-19 LAB — VITAMIN D 25 HYDROXY (VIT D DEFICIENCY, FRACTURES): Vit D, 25-Hydroxy: 28.2

## 2020-04-30 DIAGNOSIS — H43811 Vitreous degeneration, right eye: Secondary | ICD-10-CM | POA: Diagnosis not present

## 2020-04-30 DIAGNOSIS — H35371 Puckering of macula, right eye: Secondary | ICD-10-CM | POA: Diagnosis not present

## 2020-04-30 DIAGNOSIS — H26491 Other secondary cataract, right eye: Secondary | ICD-10-CM | POA: Diagnosis not present

## 2020-05-06 ENCOUNTER — Telehealth: Payer: Self-pay | Admitting: Internal Medicine

## 2020-05-06 MED ORDER — VITAMIN D (ERGOCALCIFEROL) 1.25 MG (50000 UNIT) PO CAPS: 50000.0000 [IU] | ORAL_CAPSULE | ORAL | 0 refills | Status: DC

## 2020-05-06 NOTE — Telephone Encounter (Signed)
Evaluated by surgery  04/2020. PTH 68 (normal range 15-65) Vitamin D low at 28.2. Surgery recommend vitamin D supplementation, check PTH and vitamin D levels every 6 to 12 months. Follow-up with them as needed.  Please contact patient, recommend: Ergocalciferol 50,000 units weekly for 3 months, send the prescription.

## 2020-05-11 DIAGNOSIS — Z87440 Personal history of urinary (tract) infections: Secondary | ICD-10-CM | POA: Diagnosis not present

## 2020-05-18 IMAGING — US US THYROID
1 series · 13 of 25 positions shown · non-contrast
Comparison: 09/07/2006

CLINICAL DATA: Thyroid nodule, hyperparathyroidism

EXAM:
THYROID ULTRASOUND
TECHNIQUE: Ultrasound examination of the thyroid gland and adjacent soft
tissues was performed.

[Series 1: us thyroid · 0.05mm/px · 13 of 64 slices shown]
[im 1/64]
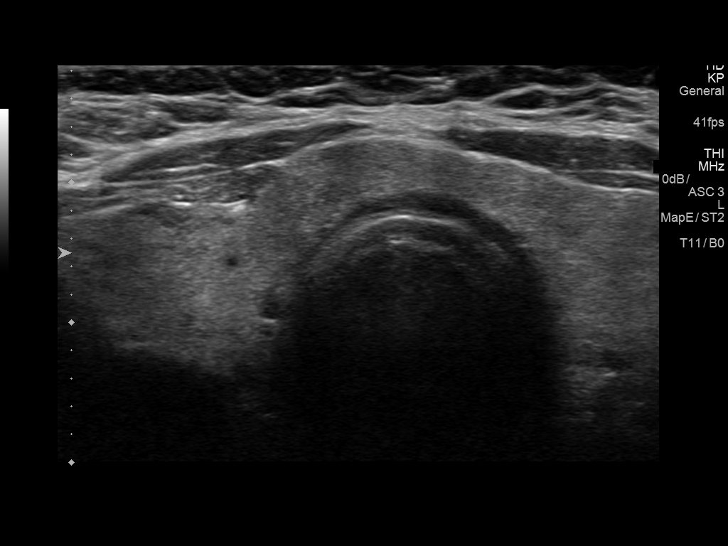
[im 6/64]
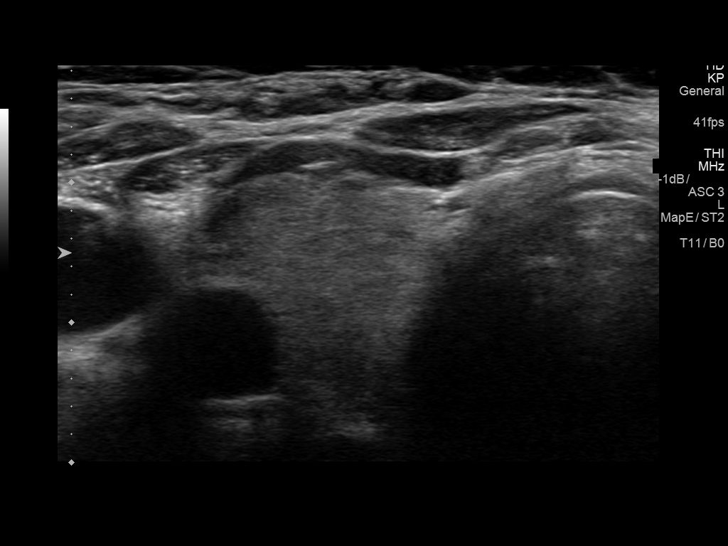
[im 11/64]
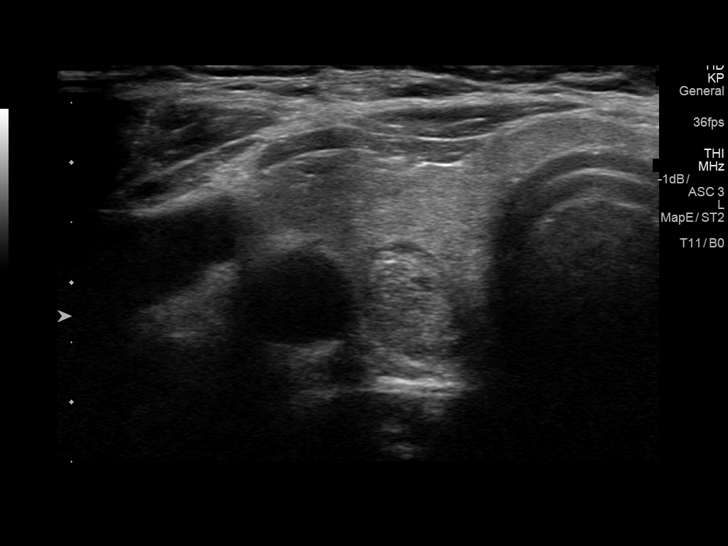
[im 16/64]
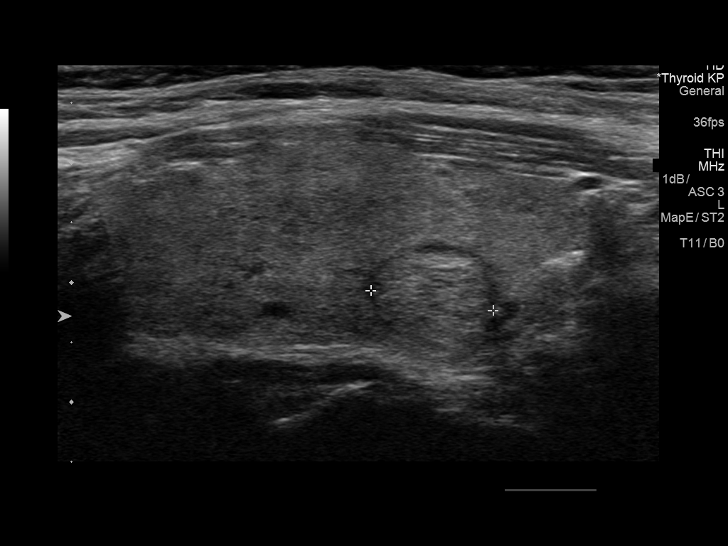
[im 22/64]
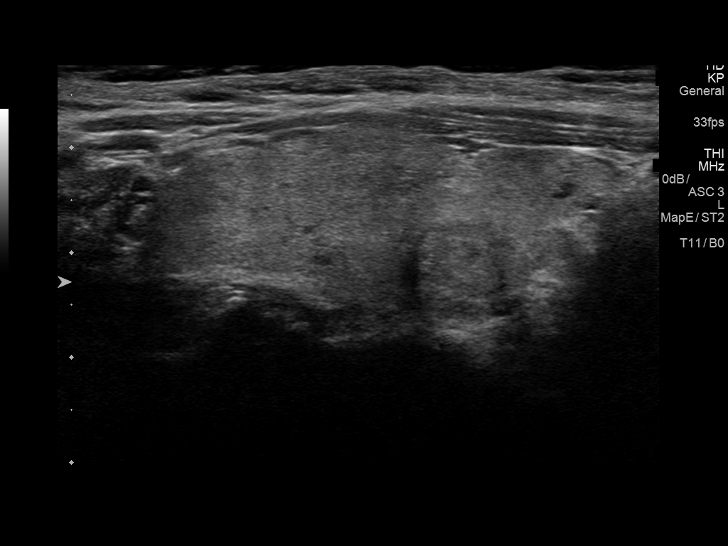
[im 27/64]
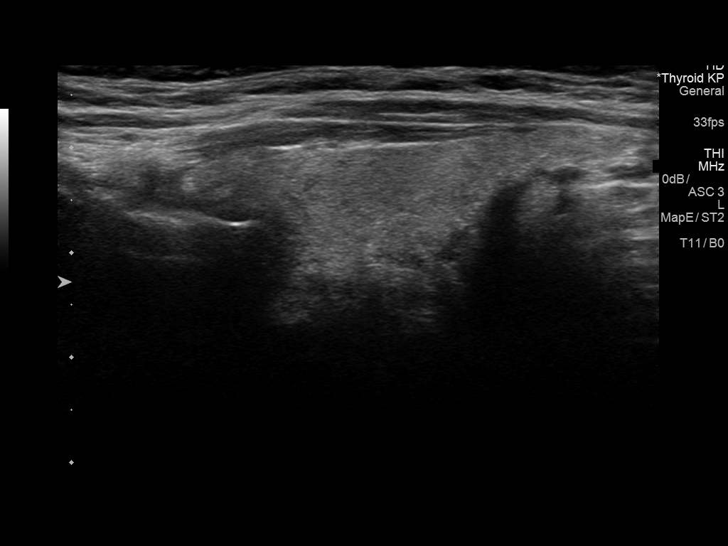
[im 32/64]
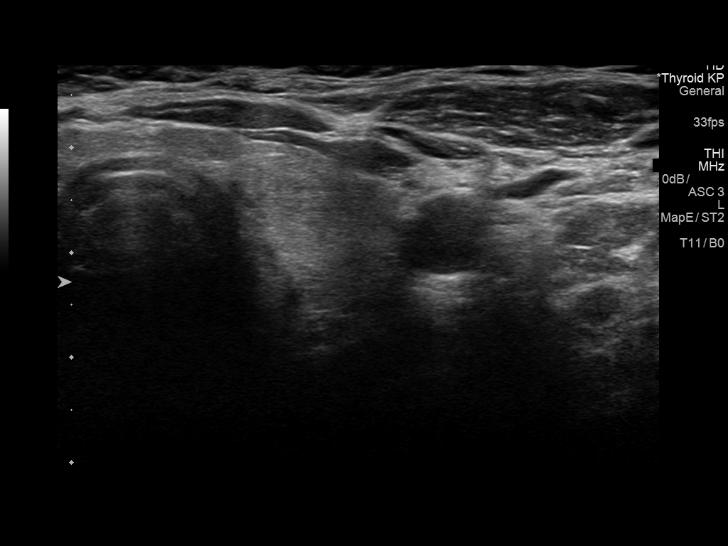
[im 37/64]
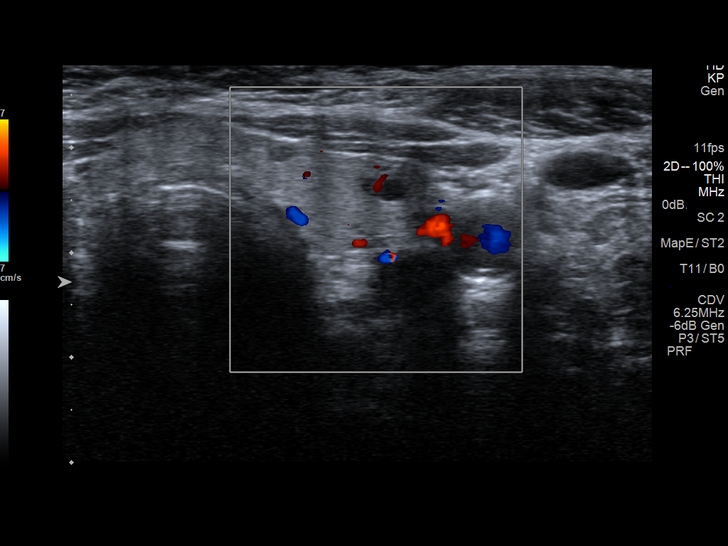
[im 43/64]
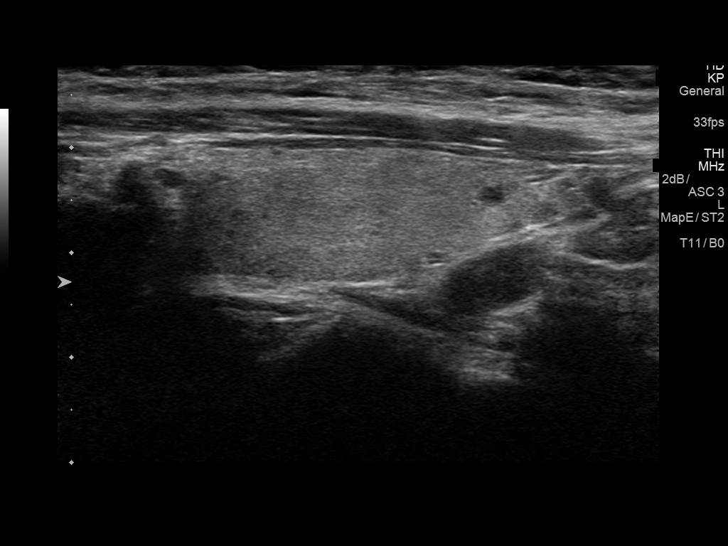
[im 48/64]
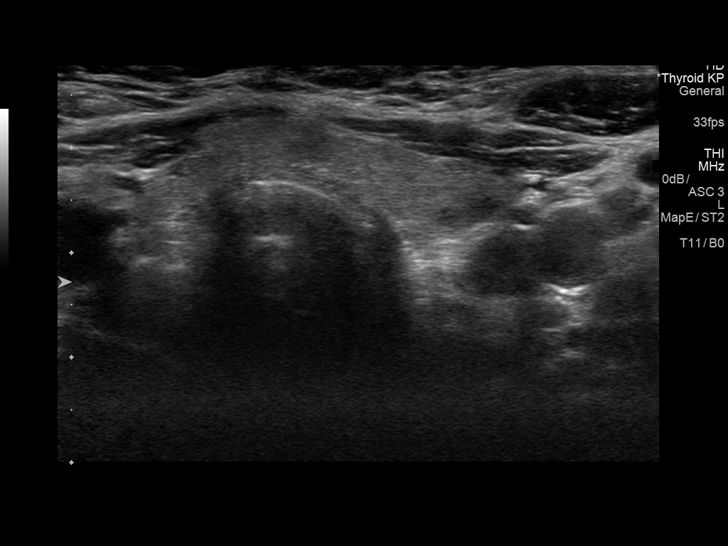
[im 53/64]
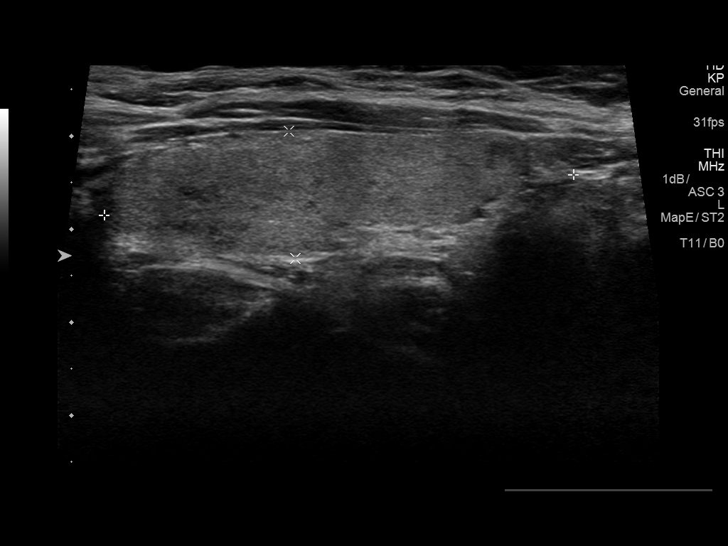
[im 58/64]
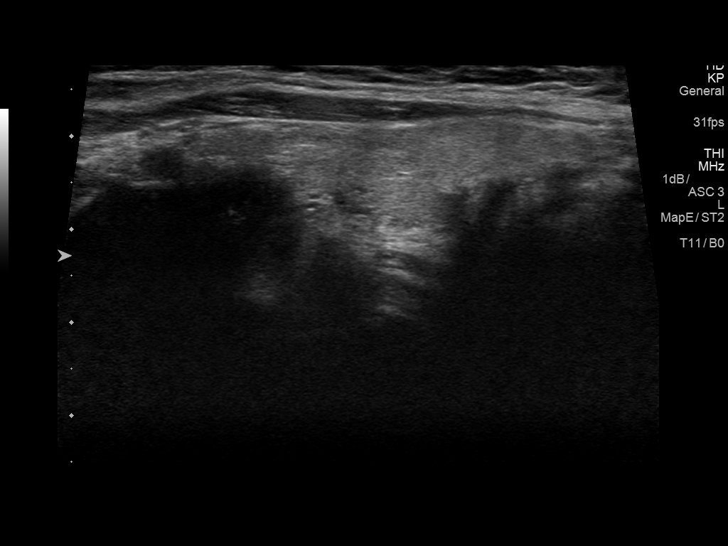
[im 64/64]
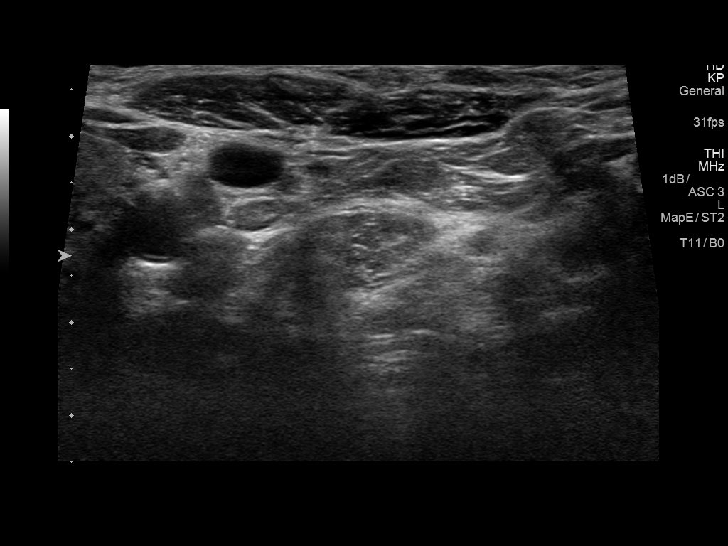

[13 of 25 positions shown; findings below may reference images not displayed]

FINDINGS: Parenchymal Echotexture: Mildly heterogenous

Isthmus: 4 mm

Right lobe: 4.6 x 1.8 x 2.0 cm, previously 4.3 x 1.2 x 2.0 cm

Left lobe: 5.1 x 1.4 x 1.6 cm, previously 4.7 x 1.4 x 1.5 cm

_________________________________________________________

Estimated total number of nodules >/= 1 cm: 1

Number of spongiform nodules >/=  2 cm not described below (TR1): 0

Number of mixed cystic and solid nodules >/= 1.5 cm not described
below (TR2): 0

_________________________________________________________

Nodule # 1:

Location: Right; Inferior

Maximum size: 1.0, unchanged cm; Other 2 dimensions: 1.0 x 0.9,
previously 1.0 x 1.0 cm

Composition: solid/almost completely solid (2)

Echogenicity: isoechoic (1)

Shape: not taller-than-wide (0)

Margins: smooth (0)

Echogenic foci: none (0)

ACR TI-RADS total points: 3.

ACR TI-RADS risk category: TR3 (3 points).

ACR TI-RADS recommendations:

Given size (<1.4 cm) and appearance, this nodule does NOT meet
TI-RADS criteria for biopsy or dedicated follow-up. This nodule has
been down graded from TR 4 to now a TR 3 nodule.

_________________________________________________________

No significant left thyroid abnormality.

Inferior and posterior to the left thyroid lower pole there is a
hypoechoic oval nodule measuring 11 x 7 x 6 mm remaining
indeterminate for an adjacent lymph node or prominent parathyroid.

No other adenopathy
IMPRESSION: 1.0 cm right inferior TR 3 nodule does not meet criteria for biopsy
or follow-up. See above comment.

11 mm indeterminate hypoechoic oval nodule inferior posterior to the
left thyroid lobe may represent adjacent lymph node versus prominent
parathyroid. Difficult to exclude adenoma. Consider repeat
parathyroid scan versus neck CT with contrast.

The above is in keeping with the ACR TI-RADS recommendations - [HOSPITAL] 2004;[DATE].

## 2020-05-26 DIAGNOSIS — H26491 Other secondary cataract, right eye: Secondary | ICD-10-CM | POA: Diagnosis not present

## 2020-05-28 ENCOUNTER — Ambulatory Visit (INDEPENDENT_AMBULATORY_CARE_PROVIDER_SITE_OTHER): Payer: Medicare Other | Admitting: Internal Medicine

## 2020-05-28 ENCOUNTER — Other Ambulatory Visit: Payer: Self-pay

## 2020-05-28 ENCOUNTER — Encounter: Payer: Self-pay | Admitting: Internal Medicine

## 2020-05-28 VITALS — BP 133/74 | HR 61 | Temp 98.2°F | Resp 18 | Ht 67.0 in | Wt 242.0 lb

## 2020-05-28 DIAGNOSIS — I10 Essential (primary) hypertension: Secondary | ICD-10-CM | POA: Diagnosis not present

## 2020-05-28 DIAGNOSIS — E210 Primary hyperparathyroidism: Secondary | ICD-10-CM

## 2020-05-28 DIAGNOSIS — D235 Other benign neoplasm of skin of trunk: Secondary | ICD-10-CM | POA: Diagnosis not present

## 2020-05-28 DIAGNOSIS — R5383 Other fatigue: Secondary | ICD-10-CM | POA: Diagnosis not present

## 2020-05-28 DIAGNOSIS — E041 Nontoxic single thyroid nodule: Secondary | ICD-10-CM | POA: Diagnosis not present

## 2020-05-28 NOTE — Progress Notes (Signed)
Subjective:    Patient ID: Veronica Bradford, female    DOB: Jan 05, 1945, 75 y.o.   MRN: [id number]  DOS:  05/28/2020 Type of visit - description: Follow-up Today we discussed  hypertension, issues with diarrhea, also primary hyperparathyroidism.  Also, has a lump on her back for many years, in the last 4 to 5 years it has increased in size and is getting sensitive.  Wt Readings from Last 3 Encounters:  05/28/20 242 lb (109.8 kg)  03/26/20 248 lb (112.5 kg)  03/11/20 244 lb 11.4 oz (111 kg)     Wt Readings from Last 3 Encounters:  05/28/20 242 lb (109.8 kg)  03/26/20 248 lb (112.5 kg)  03/11/20 244 lb 11.4 oz (111 kg)      Review of Systems See above   Past Medical History:  Diagnosis Date  . Atrial fibrillation ([place])   . Back pain   . Chronic sinusitis    s/p 2 surgeries remotely, [name]  . Closed fracture of metatarsal of left foot    L foot, fifth metatarsal  . Ectopic pregnancy   . Endometrial cancer ([place]) 2004   Early diagnosis  . GERD (gastroesophageal reflux disease)   . Hyperlipemia   . Hypertension   . IBS (irritable bowel syndrome) 08/13/2014   Dx 2015, diarrhea on-off, Dr [name]   . Lumbar radiculopathy 08/2012   MRI in 08/2012  . Mild hyperparathyroidism ([place])   . Osteopenia   . PAF (paroxysmal atrial fibrillation) ([place])   . Prediabetes   . Sciatica of right side 08/2013   Received Depomedrol 80 mg injection  . Sleep apnea 09/09/2013   Dx with OSA in 2015, by Dr. Caryl Comes, APAP    Past Surgical History:  Procedure Laterality Date  . ABDOMINAL HYSTERECTOMY    . ATRIAL FIBRILLATION ABLATION N/A 09/04/2017   Procedure: ATRIAL FIBRILLATION ABLATION;  Surgeon: [name], MD;  Location: [facility];  Service: Cardiovascular;  Laterality: N/A;  . BIOPSY  03/11/2020   Procedure: BIOPSY;  Surgeon: [name], MD;  Location: [facility];  Service: Endoscopy;;  EGD and COLON  . CARPAL TUNNEL RELEASE Right 06/17/2019  . CHOLECYSTECTOMY  ~2006  .  COLONOSCOPY WITH PROPOFOL N/A 03/11/2020   Procedure: COLONOSCOPY WITH PROPOFOL;  Surgeon: [name], MD;  Location: [facility];  Service: Endoscopy;  Laterality: N/A;  . DILATION AND CURETTAGE OF UTERUS     after SAB  . ESOPHAGOGASTRODUODENOSCOPY  02/09/2014   Dr. [name]  . ESOPHAGOGASTRODUODENOSCOPY (EGD) WITH PROPOFOL N/A 03/11/2020   Procedure: ESOPHAGOGASTRODUODENOSCOPY (EGD) WITH PROPOFOL;  Surgeon: [name], MD;  Location: [facility];  Service: Endoscopy;  Laterality: N/A;  . FINGER SURGERY Left    index  . NASAL SINUS SURGERY     x 2 remotely, [name]  . TONSILLECTOMY    . TOTAL ABDOMINAL HYSTERECTOMY W/ BILATERAL SALPINGOOPHORECTOMY    . TOTAL KNEE ARTHROPLASTY Right ~2008    Allergies as of 05/28/2020      Reactions   Lisinopril    Other reaction(s): cough   Nsaids Other (See Comments)   Pt on Xarelto   Penicillins    Local reaction   Tolmetin Other (See Comments)   Pt on Xarelto   Zoster Vaccine Live Other (See Comments)   Sickness per Pt.    Amoxicillin Rash      Medication List       Accurate as of May 28, 2020  2:17 PM. If you have any questions, ask  your nurse or doctor.        STOP taking these medications   Fish Oil 1000 MG Caps Stopped by: Kathlene November, MD   ondansetron 4 MG disintegrating tablet Commonly known as: ZOFRAN-ODT Stopped by: Kathlene November, MD   Probiotic Tbec Stopped by: Kathlene November, MD     TAKE these medications   acetaminophen 325 MG tablet Commonly known as: TYLENOL Take 325 mg by mouth every 6 (six) hours as needed for moderate pain.   acetaminophen-codeine 300-30 MG tablet Commonly known as: TYLENOL #3 Take 1 tablet by mouth daily as needed for moderate pain.   Chromium Picolinate 1000 MCG Tabs Take 1,000 mcg by mouth every evening.   Co Q 10 100 MG Caps Take 100 mg by mouth daily.   colestipol 1 g tablet Commonly known as: COLESTID Take 1 g by mouth 2 (two) times daily.   Cranberry 600 MG Tabs Take 600 mg  by mouth daily.   diltiazem 30 MG tablet Commonly known as: Cardizem Take 1 tablet every 4 hours AS NEEDED for heart rate >100 as long as blood pressure >100. What changed:   how much to take  how to take this  when to take this  reasons to take this   flecainide 50 MG tablet Commonly known as: TAMBOCOR Take 1 tablet (50 mg total) by mouth 2 (two) times daily.   fluticasone 50 MCG/ACT nasal spray Commonly known as: FLONASE Place 1 spray into both nostrils daily.   furosemide 20 MG tablet Commonly known as: LASIX Take 20 mg by mouth daily as needed for fluid.   hydroxypropyl methylcellulose / hypromellose 2.5 % ophthalmic solution Commonly known as: ISOPTO TEARS / GONIOVISC Place 1 drop into both eyes 3 (three) times daily as needed for dry eyes.   loperamide 2 MG tablet Commonly known as: IMODIUM A-D Take 2 mg by mouth daily.   pantoprazole 40 MG tablet Commonly known as: PROTONIX Take 1 tablet (40 mg total) by mouth daily.   pravastatin 40 MG tablet Commonly known as: PRAVACHOL Take 1 tablet (40 mg total) by mouth at bedtime.   rivaroxaban 20 MG Tabs tablet Commonly known as: Xarelto Take 1 tablet (20 mg total) by mouth daily with supper.   sucralfate 1 g tablet Commonly known as: CARAFATE Take 1 tablet (1 g total) by mouth 4 (four) times daily -  with meals and at bedtime.   triamterene-hydrochlorothiazide 37.5-25 MG tablet Commonly known as: MAXZIDE-25 Take 0.5 tablets by mouth daily.   vitamin B-12 1000 MCG tablet Commonly known as: CYANOCOBALAMIN Take 2,000 mcg by mouth every evening.   Vitamin B6 200 MG Tabs Take 200 tablets by mouth daily.   Vitamin D (Ergocalciferol) 1.25 MG (50000 UNIT) Caps capsule Commonly known as: DRISDOL Take 1 capsule (50,000 Units total) by mouth every 7 (seven) days.          Objective:   Physical Exam Skin:        BP 136/77 (BP Location: Left Arm, Patient Position: Sitting, Cuff Size: Large)   Pulse 61    Temp 98.2 F (36.8 C)   Resp 18   Ht 5\' 7"  (1.702 m)   Wt 242 lb (109.8 kg)   SpO2 98%   BMI 37.90 kg/m  General:   Well developed, NAD, BMI noted.  HEENT:  Normocephalic . Face symmetric, atraumatic Lungs:  CTA B Normal respiratory effort, no intercostal retractions, no accessory muscle use. Heart: RRR,  no murmur.  Abdomen:  Not distended, soft, non-tender. No rebound or rigidity.   Skin: Not pale. Not jaundice Lower extremities: no pretibial edema bilaterally  Neurologic:  alert & oriented X3.  Speech normal, gait appropriate for age and unassisted Psych--  Cognition and judgment appear intact.  Cooperative with normal attention span and concentration.  Behavior appropriate. No anxious or depressed appearing.     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  Atrial fibrillation, A. flutter: on xarelto flecainide-diltiazem  prn --Ablation 09/2017 GI: Dr [name]  --GERD (zantac or protonix prn), IBS, chronic diarrhea --GI records: [place], 2004, 2009 8 and 07-2013. Negative for polyps or IBD. + Diverticuli. EGD/cscope 11/2016; EGD/Cscope 03/11/2020   --Increased LFTs: CT abdomen 05/2016: Normal liver. 05/2017: WNL  Hepatitis B and C, Alpha 1 antitrypsin, anti-smooth muscle antibody, ANA trans-ferritin  OSA--- CPAP, Dr. Maxwell Caul Osteopenia:  Multiple  Dexas, last two:  T score 2009  -1.1; T score 2015  -0.7; T score 05/2017 -1.8 DJD --Chronic back pain-- tylenol #3 prn --- RF per PCP --history of epidurals in the past, MRI 2014: Scoliosis,DJD --CIPRO pre dental d/t knee replacement (rx by ortho) H/o endometrial cancer, found during a hysterectomy, no chemotherapy or XRT.  Primary hyperparathyroidism: Chronic hypercalcemia, increased PTH 05/2016, refered to ENT  PLAN:  HTN: See last visit, losartan switch to HCTZ, ambulatory BPs are great, subsequent  BMP was okay.  She also takes Lasix as needed and Cardizem. Nausea, vomiting, diarrhea: See last OV, overall stomach issues are much improved since valsartan was d/c and  since she changed her diet, is avoiding milk products. Sebaceous cyst: See physical exam, has a sebaceous cyst, likes excision, refer to dermatology. Obesity: Has lost some weight, eating healthier.praised  Primary hyperparathyroidism:Seen by surgery 04/03/2019, follow-up in 1 year or as needed. Plan to start moniting issue here, back to surgery if needed; check calcium, intact PTH and vitamin D level Thyroid nodules: Previously follow-up by surgery, plan to repeat ultrasound RTC 6 months   This visit occurred during the SARS-CoV-2 public health emergency.  Safety protocols were in place, including screening questions prior to the visit, additional usage of staff PPE, and extensive cleaning of exam room while observing appropriate contact time as indicated for disinfecting solutions.

## 2020-05-28 NOTE — Patient Instructions (Addendum)
Check the  blood pressure  BP GOAL is between 110/65 and  135/85. If it is consistently higher or lower, let me know    GO TO THE LAB : Get the blood work     [place], [place] back for a checkup in 6 months   STOP BY THE FIRST FLOOR:   Schedule  a thyroid ultrasound

## 2020-05-29 LAB — PTH, INTACT AND CALCIUM
Calcium: 9.8 mg/dL (ref 8.6–10.4)
PTH: 152 pg/mL — ABNORMAL HIGH (ref 16–77)

## 2020-05-29 LAB — VITAMIN D 25 HYDROXY (VIT D DEFICIENCY, FRACTURES): Vit D, 25-Hydroxy: 52 ng/mL (ref 30–100)

## 2020-05-29 NOTE — Assessment & Plan Note (Signed)
HTN: See last visit, losartan switch to HCTZ, ambulatory BPs are great, subsequent BMP was okay.  She also takes Lasix as needed and Cardizem. Nausea, vomiting, diarrhea: See last OV, overall stomach issues are much improved since valsartan was d/c and  since she changed her diet, is avoiding milk products. Sebaceous cyst: See physical exam, has a sebaceous cyst, likes excision, refer to dermatology. Obesity: Has lost some weight, eating healthier.praised  Primary hyperparathyroidism:Seen by surgery 04/03/2019, follow-up in 1 year or as needed. Plan to start moniting issue here, back to surgery if needed; check calcium, intact PTH and vitamin D level Thyroid nodules: Previously follow-up by surgery, plan to repeat ultrasound RTC 6 months

## 2020-06-07 ENCOUNTER — Other Ambulatory Visit: Payer: Self-pay

## 2020-06-07 ENCOUNTER — Encounter: Payer: Self-pay | Admitting: Internal Medicine

## 2020-06-07 ENCOUNTER — Ambulatory Visit (HOSPITAL_BASED_OUTPATIENT_CLINIC_OR_DEPARTMENT_OTHER)
Admission: RE | Admit: 2020-06-07 | Discharge: 2020-06-07 | Disposition: A | Discharge status: Home / Self Care | Payer: Medicare Other | Source: Ambulatory Visit | Attending: Internal Medicine | Admitting: Internal Medicine

## 2020-06-07 ENCOUNTER — Other Ambulatory Visit: Payer: Self-pay | Admitting: Internal Medicine

## 2020-06-07 DIAGNOSIS — E213 Hyperparathyroidism, unspecified: Secondary | ICD-10-CM | POA: Diagnosis not present

## 2020-06-07 DIAGNOSIS — E041 Nontoxic single thyroid nodule: Secondary | ICD-10-CM | POA: Insufficient documentation

## 2020-06-08 ENCOUNTER — Ambulatory Visit (INDEPENDENT_AMBULATORY_CARE_PROVIDER_SITE_OTHER): Payer: Medicare Other

## 2020-06-08 VITALS — Ht 67.0 in | Wt 240.0 lb

## 2020-06-08 DIAGNOSIS — Z0000 Encounter for general adult medical examination without abnormal findings: Secondary | ICD-10-CM | POA: Diagnosis not present

## 2020-06-08 NOTE — Progress Notes (Signed)
Subjective:   Veronica Bradford is a 75 y.o. female who presents for Medicare Annual (Subsequent) preventive examination.  I connected with [name] today by telephone and verified that I am speaking with the correct person using two identifiers. 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    I discussed the limitations, risks, security and privacy concerns of performing an evaluation and management service by telephone and the availability of in person appointments. I also discussed with the patient that there may be a patient responsible charge related to this service. The patient expressed understanding and verbally consented to this telephonic visit.    Interactive audio and video telecommunications were attempted between this provider and patient, however failed, due to patient having technical difficulties OR patient did not have access to video capability.  We continued and completed visit with audio only.  Some vital signs may be absent or patient reported.   Time Spent with patient on telephone encounter: 30 minutes   Review of Systems     Cardiac Risk Factors include: advanced age (>15men, >22 women);hypertension;dyslipidemia;obesity (BMI >30kg/m2)     Objective:    Today's Vitals   06/08/20 1340  Weight: 240 lb (108.9 kg)  Height: 5\' 7"  (1.702 m)   Body mass index is 37.59 kg/m.  Advanced Directives 06/08/2020 03/09/2020 07/23/2019 05/08/2019 05/06/2018 10/18/2017 09/04/2017  Does Patient Have a Medical Advance Directive? No Yes;No No No No No Yes  Type of Advance Directive - - - - - - Press photographer;Living will  Does patient want to make changes to medical advance directive? - No - Patient declined - - - - No - Patient declined  Copy of [place] in Chart? - - - - - - No - copy requested  Would patient like information on creating a medical advance directive? Yes  (MAU/Ambulatory/Procedural Areas - Information given) No - Patient declined - No - Patient declined No - Patient declined - -    Current Medications (verified) Outpatient Encounter Medications as of 06/08/2020  Medication Sig  . acetaminophen (TYLENOL) 325 MG tablet Take 325 mg by mouth every 6 (six) hours as needed for moderate pain.  Marland Kitchen acetaminophen-codeine (TYLENOL #3) 300-30 MG tablet Take 1 tablet by mouth daily as needed for moderate pain.  . Chromium Picolinate 1000 MCG TABS Take 1,000 mcg by mouth every evening.  . Coenzyme Q10 (CO Q 10) 100 MG CAPS Take 100 mg by mouth daily.   . colestipol (COLESTID) 1 g tablet Take 1 g by mouth 2 (two) times daily.  . Cranberry 600 MG TABS Take 600 mg by mouth daily.  Marland Kitchen diltiazem (CARDIZEM) 30 MG tablet Take 1 tablet every 4 hours AS NEEDED for heart rate >100 as long as blood pressure >100. (Patient taking differently: Take 30 mg by mouth daily as needed (Afib). Take 1 tablet every 4 hours AS NEEDED for heart rate >100 as long as blood pressure >100.)  . flecainide (TAMBOCOR) 50 MG tablet Take 1 tablet (50 mg total) by mouth 2 (two) times daily.  . fluticasone (FLONASE) 50 MCG/ACT nasal spray Place 1 spray into both nostrils daily.  . furosemide (LASIX) 20 MG tablet Take 20 mg by mouth daily as needed for fluid.  . hydroxypropyl methylcellulose / hypromellose (ISOPTO TEARS / GONIOVISC) 2.5 % ophthalmic solution Place 1 drop into both eyes 3 (three) times daily as needed for dry eyes.  Marland Kitchen loperamide (IMODIUM A-D) 2  MG tablet Take 2 mg by mouth daily.  . pantoprazole (PROTONIX) 40 MG tablet Take 1 tablet (40 mg total) by mouth daily.  . pravastatin (PRAVACHOL) 40 MG tablet Take 1 tablet (40 mg total) by mouth at bedtime.  . Pyridoxine HCl (VITAMIN B6) 200 MG TABS Take 200 tablets by mouth daily.  . rivaroxaban (XARELTO) 20 MG TABS tablet Take 1 tablet (20 mg total) by mouth daily with supper.  . triamterene-hydrochlorothiazide (MAXZIDE-25) 37.5-25 MG  tablet Take 0.5 tablets by mouth daily.  . vitamin B-12 (CYANOCOBALAMIN) 1000 MCG tablet Take 2,000 mcg by mouth every evening.   . Vitamin D, Ergocalciferol, (DRISDOL) 1.25 MG (50000 UNIT) CAPS capsule Take 1 capsule (50,000 Units total) by mouth every 7 (seven) days.  . sucralfate (CARAFATE) 1 g tablet Take 1 tablet (1 g total) by mouth 4 (four) times daily -  with meals and at bedtime.   No facility-administered encounter medications on file as of 06/08/2020.    Allergies (verified) Lisinopril, Nsaids, Penicillins, Tolmetin, Zoster vaccine live, and Amoxicillin   History: Past Medical History:  Diagnosis Date  . Atrial fibrillation ([place])   . Back pain   . Chronic sinusitis    s/p 2 surgeries remotely, [name]  . Closed fracture of metatarsal of left foot    L foot, fifth metatarsal  . Ectopic pregnancy   . Endometrial cancer ([place]) 2004   Early diagnosis  . GERD (gastroesophageal reflux disease)   . Hyperlipemia   . Hypertension   . IBS (irritable bowel syndrome) 08/13/2014   Dx 2015, diarrhea on-off, Dr [name]   . Lumbar radiculopathy 08/2012   MRI in 08/2012  . 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 Osteopenia   . PAF (paroxysmal atrial fibrillation) ([place])   . Prediabetes   . Sciatica of right side 08/2013   Received Depomedrol 80 mg injection  . Sleep apnea 09/09/2013   Dx with OSA in 2015, by Dr. Caryl Comes, APAP   Past Surgical History:  Procedure Laterality Date  . ABDOMINAL HYSTERECTOMY    . ATRIAL FIBRILLATION ABLATION N/A 09/04/2017   Procedure: ATRIAL FIBRILLATION ABLATION;  Surgeon: [name], MD;  Location: [facility];  Service: Cardiovascular;  Laterality: N/A;  . BIOPSY  03/11/2020   Procedure: BIOPSY;  Surgeon: [name], MD;  Location: [facility];  Service: Endoscopy;;  EGD and COLON  . CARPAL TUNNEL RELEASE Right 06/17/2019  . CHOLECYSTECTOMY  ~2006  . COLONOSCOPY WITH PROPOFOL N/A 03/11/2020   Procedure: COLONOSCOPY WITH PROPOFOL;  Surgeon:  [name], MD;  Location: [facility];  Service: Endoscopy;  Laterality: N/A;  . DILATION AND CURETTAGE OF UTERUS     after SAB  . ESOPHAGOGASTRODUODENOSCOPY  02/09/2014   Dr. [name]  . ESOPHAGOGASTRODUODENOSCOPY (EGD) WITH PROPOFOL N/A 03/11/2020   Procedure: ESOPHAGOGASTRODUODENOSCOPY (EGD) WITH PROPOFOL;  Surgeon: [name], MD;  Location: [facility];  Service: Endoscopy;  Laterality: N/A;  . FINGER SURGERY Left    index  . NASAL SINUS SURGERY     x 2 remotely, [name]  . TONSILLECTOMY    . TOTAL ABDOMINAL HYSTERECTOMY W/ BILATERAL SALPINGOOPHORECTOMY    . TOTAL KNEE ARTHROPLASTY Right ~2008   Family History  Problem Relation Age of Onset  . Hypertension Mother   . Colon cancer Mother   . Breast cancer Mother   . Ovarian cancer Mother   . Hypertension Father   . Diabetes Father   . Head & neck cancer Sister   . CAD Neg  Hx    Social History   Socioeconomic History  . Marital status: Married    Spouse name: Not on file  . Number of children: 1  . Years of education: Not on file  . Highest education level: Not on file  Occupational History  . 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 Smoking status: Former Smoker    Quit date: 01/06/1980    Years since quitting: 40.4  . Smokeless tobacco: Never Used  . Tobacco comment: smoked from 1970 to 1982, less than 1 ppd  Vaping Use  . Vaping Use: Never used  Substance and Sexual Activity  . Alcohol use: Yes    Comment: rare  . Drug use: No  . Sexual activity: Not Currently    Birth control/protection: None  Other Topics Concern  . Not on file  Social History Narrative   Lives w/ husband, and son [name]   Retired Marine scientist   Lives in [place] Strain: [place]   . Difficulty of Paying Living Expenses: Not hard at all  Food Insecurity: No Food Insecurity  . Worried About Charity fundraiser in the Last Year: Never true  . Ran Out of Food in the Last  Year: Never true  Transportation Needs: No Transportation Needs  . Lack of Transportation (Medical): No  . Lack of Transportation (Non-Medical): No  Physical Activity: Sufficiently Active  . Days of Exercise per Week: 7 days  . Minutes of Exercise per Session: 30 min  Stress: No Stress Concern Present  . Feeling of Stress : Not at all  Social Connections: Moderately Integrated  . Frequency of Communication with Friends and Family: More than three times a week  . Frequency of Social Gatherings with Friends and Family: Once a week  . Attends Religious Services: Never  . Active Member of Clubs or Organizations: Yes  . Attends Archivist Meetings: More than 4 times per year  . Marital Status: Married    Tobacco Counseling Counseling given: Not Answered Comment: smoked from 1970 to 1982, less than 1 ppd   Clinical Intake:  Pre-visit preparation completed: Yes  Pain : No/denies pain     Nutritional Status: BMI > 30  Obese Nutritional Risks: None Diabetes: No  How often do you need to have someone help you when you read instructions, pamphlets, or other written materials from your doctor or pharmacy?: 1 - Never  Diabetic?No  Interpreter Needed?: No  Information entered by :: Caroleen Hamman LPN   Activities of Daily Living In your present state of health, do you have any difficulty performing the following activities: 06/08/2020 03/09/2020  Hearing? N N  Vision? N N  Difficulty concentrating or making decisions? N N  Walking or climbing stairs? N Y  Comment - secondary to weakness  Dressing or bathing? N N  Doing errands, shopping? N N  Preparing Food and eating ? N -  Using the Toilet? N -  In the past six months, have you accidently leaked urine? Y -  Comment occasionally -  Do you have problems with loss of bowel control? N -  Managing your Medications? N -  Managing your Finances? N -  Housekeeping or managing your Housekeeping? N -  Some recent data  might be hidden    Patient Care Team: Colon Branch, MD as PCP - General (Internal Medicine) Marius Ditch, MD as Consulting Physician (Internal Medicine) Caryl Comes,  Revonda Standard, MD as Consulting Physician (Cardiology) Wylene Simmer, MD as Consulting Physician (Orthopedic Surgery) Armandina Gemma, MD as Consulting Physician (General Surgery) [name], MD as Consulting Physician (Gastroenterology) [place], Heather, [place] as Consulting Physician (Optometry) Sherlynn Stalls, MD as Consulting Physician (Ophthalmology) Roseanne Kaufman, MD as Consulting Physician (Orthopedic Surgery)  Indicate any recent Medical Services you may have received from other than [facility] providers in the past year (date may be approximate).     Assessment:   This is a routine wellness examination for [place].  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 [place] Reading glasses  Dietary issues and exercise activities discussed: Current Exercise Habits: Home exercise routine, Type of exercise: walking, Time (Minutes): 30, Frequency (Times/Week): 7, Weekly Exercise (Minutes/Week): 210, Intensity: Mild, Exercise limited by: None identified  Goals Addressed   None    Depression Screen PHQ 2/9 Scores 06/08/2020 05/28/2020 12/29/2019 05/08/2019 05/06/2018 05/03/2017 05/02/2016  PHQ - 2 Score 0 0 0 0 0 0 0    Fall Risk Fall Risk  06/08/2020 05/28/2020 12/29/2019 05/08/2019 09/19/2018  Falls in the past year? 0 0 0 0 0  Number falls in past yr: 0 - 0 0 -  Injury with Fall? 0 - 0 0 -  Follow up Falls prevention discussed - - Education provided;Falls prevention discussed Falls evaluation completed    FALL RISK PREVENTION PERTAINING TO THE HOME:  Any stairs in or around the home? Yes  If so, are there any without handrails? No  Home free of  loose throw rugs in walkways, pet beds, electrical cords, etc? Yes  Adequate lighting in your home to reduce risk of falls? Yes   ASSISTIVE DEVICES UTILIZED TO PREVENT FALLS:  Life alert? No  Use of a cane, walker or w/c? No  Grab bars in the bathroom? Yes  Shower chair or bench in shower? No  Elevated toilet seat or a handicapped toilet? No   TIMED UP AND GO:  Was the test performed? No . Phone visit   Cognitive Function:Normal cognitive status assessed by  this Nurse Health Advisor. No abnormalities found.          Immunizations Immunization History  Administered Date(s) Administered  . Fluad Quad(high Dose 65+) 09/19/2018  . Influenza, High Dose Seasonal PF 10/01/2014, 10/19/2015, 11/10/2016, 10/03/2017  . Influenza-Unspecified 10/19/2010, 09/09/2013, 09/25/2019  . PFIZER(Purple Top)SARS-COV-2 Vaccination 02/09/2019, 03/06/2019, 10/08/2019, 04/12/2020  . Pneumococcal Conjugate-13 03/07/2013  . Pneumococcal Polysaccharide-23 09/09/2013  . Tdap 07/26/2011  . Zoster, Live 11/12/2012    TDAP status: Up to date  Flu Vaccine status: Up to date  Pneumococcal vaccine status: Up to date  Covid-19 vaccine status: Completed vaccines  Qualifies for Shingles Vaccine? Yes   Zostavax completed Yes   Shingrix Completed?: No.    Education has been provided regarding the importance of this vaccine. Patient has been advised to call insurance company to determine out of pocket expense if they have not yet received this vaccine. Advised may also receive vaccine at local pharmacy or Health Dept. Verbalized acceptance and understanding.  Screening Tests Health Maintenance  Topic Date Due  . Pneumococcal Vaccine 16-26 Years old (1 of 4 - PCV13) Never done  . Zoster Vaccines- Shingrix (1 of  2) Never done  . INFLUENZA VACCINE  08/02/2020  . MAMMOGRAM  11/16/2020  . DEXA SCAN  11/16/2020  . TETANUS/TDAP  07/25/2021  . COLONOSCOPY (Pts 45-32yrs Insurance coverage will need to be  confirmed)  03/11/2025  . COVID-19 Vaccine  Completed  . Hepatitis C Screening  Completed  . PNA vac Low Risk Adult  Completed  . HPV VACCINES  Aged Out    Health Maintenance  Health Maintenance Due  Topic Date Due  . Pneumococcal Vaccine 2-5 Years old (1 of 4 - PCV13) Never done  . Zoster Vaccines- Shingrix (1 of 2) Never done    Colorectal cancer screening: Type of screening: Colonoscopy. Completed 03/11/2020. Repeat every 5 years  Mammogram status: Completed Bilateral 11/17/2019. Repeat every year  Bone Density status: Completed 11/17/2019. Results reflect: Bone density results: OSTEOPENIA. Repeat every 2 years.  Lung Cancer Screening: (Low Dose CT Chest recommended if Age 46-80 years, 30 pack-year currently smoking OR have quit w/in 15years.) does not qualify.     Additional Screening:  Hepatitis C Screening: Completed 05/02/2016  Vision Screening: Recommended annual ophthalmology exams for early detection of glaucoma and other disorders of the eye. Is the patient up to date with their annual eye exam?  Yes  Who is the provider or what is the name of the office in which the patient attends annual eye exams? Dr. [place]   Dental Screening: Recommended annual dental exams for proper oral hygiene  Community Resource Referral / Chronic Care Management: CRR required this visit?  No   CCM required this visit?  No      Plan:     I have personally reviewed and noted the following in the patient's chart:   . Medical and social history . Use of alcohol, tobacco or illicit drugs  . Current medications and supplements including opioid prescriptions.  . Functional ability and status . Nutritional status . Physical activity . Advanced directives . List of other physicians . Hospitalizations, surgeries, and ER visits in previous 12 months . Vitals . Screenings to include cognitive, depression, and falls . Referrals and appointments  In addition, I have reviewed and  discussed with patient certain preventive protocols, quality metrics, and best practice recommendations. A written personalized care plan for preventive services as well as general preventive health recommendations were provided to patient.   Due to this being a telephonic visit, the after visit summary with patients personalized plan was offered to patient via mail or my-chart. Patient would like to access on my-chart.   Marta Antu, LPN   07/08/4140  Nurse Health Advisor  Nurse Notes: None

## 2020-06-08 NOTE — Patient Instructions (Signed)
Veronica Bradford , Thank you for taking time to complete your Medicare Wellness Visit. I appreciate your ongoing commitment to your health goals. Please review the following plan we discussed and let me know if I can assist you in the future.   Screening recommendations/referrals: Colonoscopy: Completed 03/11/2020-Due 03/11/2025 Mammogram: Completed 11/17/2019-Due 11/16/2020 Bone Density: Completed 11/17/2019-Due 11/16/2021 Recommended yearly ophthalmology/optometry visit for glaucoma screening and checkup Recommended yearly dental visit for hygiene and checkup  Vaccinations: Influenza vaccine: Up to date Pneumococcal vaccine: Up to date Tdap vaccine: Up to date-Due-07/25/2021 Shingles vaccine: Discuss with pharmacy  Covid-19:Up to date  Advanced directives: Information mailed today  Conditions/risks identified: See problem list  Next appointment: Follow up in one year for your annual wellness visit 06/13/2021 @ 1:00   Preventive Care 65 Years and Older, Female Preventive care refers to lifestyle choices and visits with your health care provider that can promote health and wellness. What does preventive care include?  A yearly physical exam. This is also called an annual well check.  Dental exams once or twice a year.  Routine eye exams. Ask your health care provider how often you should have your eyes checked.  Personal lifestyle choices, including:  Daily care of your teeth and gums.  Regular physical activity.  Eating a healthy diet.  Avoiding tobacco and drug use.  Limiting alcohol use.  Practicing safe sex.  Taking low-dose aspirin every day.  Taking vitamin and mineral supplements as recommended by your health care provider. What happens during an annual well check? The services and screenings done by your health care provider during your annual well check will depend on your age, overall health, lifestyle risk factors, and family history of disease. Counseling  Your  health care provider may ask you questions about your:  Alcohol use.  Tobacco use.  Drug use.  Emotional well-being.  Home and relationship well-being.  Sexual activity.  Eating habits.  History of falls.  Memory and ability to understand (cognition).  Work and work Statistician.  Reproductive health. Screening  You may have the following tests or measurements:  Height, weight, and BMI.  Blood pressure.  Lipid and cholesterol levels. These may be checked every 5 years, or more frequently if you are over 36 years old.  Skin check.  Lung cancer screening. You may have this screening every year starting at age 76 if you have a 30-pack-year history of smoking and currently smoke or have quit within the past 15 years.  Fecal occult blood test (FOBT) of the stool. You may have this test every year starting at age 34.  Flexible sigmoidoscopy or colonoscopy. You may have a sigmoidoscopy every 5 years or a colonoscopy every 10 years starting at age 76.  Hepatitis C blood test.  Hepatitis B blood test.  Sexually transmitted disease (STD) testing.  Diabetes screening. This is done by checking your blood sugar (glucose) after you have not eaten for a while (fasting). You may have this done every 1-3 years.  Bone density scan. This is done to screen for osteoporosis. You may have this done starting at age 58.  Mammogram. This may be done every 1-2 years. Talk to your health care provider about how often you should have regular mammograms. Talk with your health care provider about your test results, treatment options, and if necessary, the need for more tests. Vaccines  Your health care provider may recommend certain vaccines, such as:  Influenza vaccine. This is recommended every year.  Tetanus, diphtheria, and  acellular pertussis (Tdap, Td) vaccine. You may need a Td booster every 10 years.  Zoster vaccine. You may need this after age 50.  Pneumococcal 13-valent  conjugate (PCV13) vaccine. One dose is recommended after age 23.  Pneumococcal polysaccharide (PPSV23) vaccine. One dose is recommended after age 2. Talk to your health care provider about which screenings and vaccines you need and how often you need them. This information is not intended to replace advice given to you by your health care provider. Make sure you discuss any questions you have with your health care provider. Document Released: 01/15/2015 Document Revised: 09/08/2015 Document Reviewed: 10/20/2014 Elsevier Interactive Patient Education  2017 [place] Prevention in the Home Falls can cause injuries. They can happen to people of all ages. There are many things you can do to make your home safe and to help prevent falls. What can I do on the outside of my home?  Regularly fix the edges of walkways and driveways and fix any cracks.  Remove anything that might make you trip as you walk through a door, such as a raised step or threshold.  Trim any bushes or trees on the path to your home.  Use bright outdoor lighting.  Clear any walking paths of anything that might make someone trip, such as rocks or tools.  Regularly check to see if handrails are loose or broken. Make sure that both sides of any steps have handrails.  Any raised decks and porches should have guardrails on the edges.  Have any leaves, snow, or ice cleared regularly.  Use sand or salt on walking paths during winter.  Clean up any spills in your garage right away. This includes oil or grease spills. What can I do in the bathroom?  Use night lights.  Install grab bars by the toilet and in the tub and shower. Do not use towel bars as grab bars.  Use non-skid mats or decals in the tub or shower.  If you need to sit down in the shower, use a plastic, non-slip stool.  Keep the floor dry. Clean up any water that spills on the floor as soon as it happens.  Remove soap buildup in the tub or  shower regularly.  Attach bath mats securely with double-sided non-slip rug tape.  Do not have throw rugs and other things on the floor that can make you trip. What can I do in the bedroom?  Use night lights.  Make sure that you have a light by your bed that is easy to reach.  Do not use any sheets or blankets that are too big for your bed. They should not hang down onto the floor.  Have a firm chair that has side arms. You can use this for support while you get dressed.  Do not have throw rugs and other things on the floor that can make you trip. What can I do in the kitchen?  Clean up any spills right away.  Avoid walking on wet floors.  Keep items that you use a lot in easy-to-reach places.  If you need to reach something above you, use a strong step stool that has a grab bar.  Keep electrical cords out of the way.  Do not use floor polish or wax that makes floors slippery. If you must use wax, use non-skid floor wax.  Do not have throw rugs and other things on the floor that can make you trip. What can I do with my stairs?  Do not leave any items on the stairs.  Make sure that there are handrails on both sides of the stairs and use them. Fix handrails that are broken or loose. Make sure that handrails are as long as the stairways.  Check any carpeting to make sure that it is firmly attached to the stairs. Fix any carpet that is loose or worn.  Avoid having throw rugs at the top or bottom of the stairs. If you do have throw rugs, attach them to the floor with carpet tape.  Make sure that you have a light switch at the top of the stairs and the bottom of the stairs. If you do not have them, ask someone to add them for you. What else can I do to help prevent falls?  Wear shoes that:  Do not have high heels.  Have rubber bottoms.  Are comfortable and fit you well.  Are closed at the toe. Do not wear sandals.  If you use a stepladder:  Make sure that it is fully  opened. Do not climb a closed stepladder.  Make sure that both sides of the stepladder are locked into place.  Ask someone to hold it for you, if possible.  Clearly mark and make sure that you can see:  Any grab bars or handrails.  First and last steps.  Where the edge of each step is.  Use tools that help you move around (mobility aids) if they are needed. These include:  Canes.  Walkers.  Scooters.  Crutches.  Turn on the lights when you go into a dark area. Replace any light bulbs as soon as they burn out.  Set up your furniture so you have a clear path. Avoid moving your furniture around.  If any of your floors are uneven, fix them.  If there are any pets around you, be aware of where they are.  Review your medicines with your doctor. Some medicines can make you feel dizzy. This can increase your chance of falling. Ask your doctor what other things that you can do to help prevent falls. This information is not intended to replace advice given to you by your health care provider. Make sure you discuss any questions you have with your health care provider. Document Released: 10/15/2008 Document Revised: 05/27/2015 Document Reviewed: 01/23/2014 Elsevier Interactive Patient Education  2017 [employer].

## 2020-06-09 ENCOUNTER — Other Ambulatory Visit: Payer: Self-pay

## 2020-06-09 DIAGNOSIS — E041 Nontoxic single thyroid nodule: Secondary | ICD-10-CM

## 2020-06-10 ENCOUNTER — Other Ambulatory Visit: Payer: Self-pay | Admitting: Internal Medicine

## 2020-06-10 DIAGNOSIS — E041 Nontoxic single thyroid nodule: Secondary | ICD-10-CM

## 2020-06-16 ENCOUNTER — Ambulatory Visit
Admission: RE | Admit: 2020-06-16 | Discharge: 2020-06-16 | Disposition: A | Discharge status: Home / Self Care | Payer: Medicare Other | Source: Ambulatory Visit | Attending: Internal Medicine | Admitting: Internal Medicine

## 2020-06-16 ENCOUNTER — Other Ambulatory Visit (HOSPITAL_COMMUNITY)
Admission: RE | Admit: 2020-06-16 | Discharge: 2020-06-16 | Disposition: A | Discharge status: Home / Self Care | Payer: Medicare Other | Source: Ambulatory Visit | Attending: Student | Admitting: Student

## 2020-06-16 DIAGNOSIS — E041 Nontoxic single thyroid nodule: Secondary | ICD-10-CM | POA: Insufficient documentation

## 2020-06-17 LAB — CYTOLOGY - NON PAP

## 2020-06-22 DIAGNOSIS — G4733 Obstructive sleep apnea (adult) (pediatric): Secondary | ICD-10-CM | POA: Diagnosis not present

## 2020-06-29 ENCOUNTER — Ambulatory Visit: Payer: Medicare Other | Admitting: Internal Medicine

## 2020-07-01 ENCOUNTER — Encounter: Payer: Self-pay | Admitting: Internal Medicine

## 2020-07-01 DIAGNOSIS — D351 Benign neoplasm of parathyroid gland: Secondary | ICD-10-CM

## 2020-07-12 ENCOUNTER — Ambulatory Visit (INDEPENDENT_AMBULATORY_CARE_PROVIDER_SITE_OTHER): Payer: Medicare Other

## 2020-07-12 ENCOUNTER — Ambulatory Visit (INDEPENDENT_AMBULATORY_CARE_PROVIDER_SITE_OTHER): Payer: Medicare Other | Admitting: *Deleted

## 2020-07-12 ENCOUNTER — Other Ambulatory Visit: Payer: Self-pay

## 2020-07-12 DIAGNOSIS — I480 Paroxysmal atrial fibrillation: Secondary | ICD-10-CM

## 2020-07-12 DIAGNOSIS — H02831 Dermatochalasis of right upper eyelid: Secondary | ICD-10-CM | POA: Diagnosis not present

## 2020-07-12 DIAGNOSIS — Z961 Presence of intraocular lens: Secondary | ICD-10-CM | POA: Diagnosis not present

## 2020-07-12 DIAGNOSIS — H18413 Arcus senilis, bilateral: Secondary | ICD-10-CM | POA: Diagnosis not present

## 2020-07-12 DIAGNOSIS — H26491 Other secondary cataract, right eye: Secondary | ICD-10-CM | POA: Diagnosis not present

## 2020-07-12 DIAGNOSIS — H35373 Puckering of macula, bilateral: Secondary | ICD-10-CM | POA: Diagnosis not present

## 2020-07-12 NOTE — Progress Notes (Signed)
Reason for visit: EKG for pt who sent [place] reading  Name of MD requesting visit: Dr. Fransico Him, DOD*  H&P: Pt on flecainide for PVCs, which has been very helpful.  Noted slow HRs and some associated lightheadedness over the past 2 days.  ROS related to problem: Dr. Radford Pax reviewed the [facility] and requested EKG to confirm it is SB w PACs - plan: if EKG ok, obtain 2 week Zio monitor  Assessment and plan per MD: EKG confirmed by Dr. Radford Pax, Sinus bradycardia LAFB, RBBB, LVH, HR is 52;  she ordered 2 week Zio monitor which pt understands will be mailed to her home.

## 2020-07-12 NOTE — Telephone Encounter (Signed)
Pt called office and has been added to nurse room schedule for EKG to be reviewed by Dr. Radford Pax today.

## 2020-07-12 NOTE — Progress Notes (Unsigned)
Patient enrolled for [employer] to mail a 14 day ZIO XT monitor to address on file. 

## 2020-07-12 NOTE — Telephone Encounter (Signed)
I reviewed with Dr. Radford Pax.  She adv this tracing is difficult to confirm its sinus.  Would like for pt to come today for EKG and if okay she should have a 2 week Zio monitor placed.   Left message for pt to call back and replied to her myChart message.

## 2020-07-15 DIAGNOSIS — I480 Paroxysmal atrial fibrillation: Secondary | ICD-10-CM | POA: Diagnosis not present

## 2020-07-20 ENCOUNTER — Telehealth: Payer: Self-pay | Admitting: Internal Medicine

## 2020-07-20 DIAGNOSIS — Z961 Presence of intraocular lens: Secondary | ICD-10-CM | POA: Diagnosis not present

## 2020-07-20 MED ORDER — ACETAMINOPHEN-CODEINE #3 300-30 MG PO TABS: 1.0000 | ORAL_TABLET | Freq: Every day | ORAL | 0 refills | Status: DC | PRN

## 2020-07-20 NOTE — Telephone Encounter (Signed)
PDMP okay, Rx sent 

## 2020-07-20 NOTE — Telephone Encounter (Signed)
Requesting: Tylenol #3 Contract:  UDS: 12/29/19 Last Visit: 05/28/20 Next Visit: 11/30/20 Last Refill: 03/26/20   Please Advise

## 2020-07-26 ENCOUNTER — Telehealth: Payer: Self-pay | Admitting: Internal Medicine

## 2020-07-26 NOTE — Telephone Encounter (Signed)
[employer] called to report end results of patient's monitor. Patient had two episodes of bradycardia. One was HR 37 for 30 seconds, and HR 36 for 30 seconds on 07/16/20 at 1246 pm  and 450 pm. Will send message to Dr. Caryl Comes and his nurse.

## 2020-07-26 NOTE — Telephone Encounter (Signed)
New Message:        Monica from [facility] is calling. She have an abnormal  EKG result. from

## 2020-07-28 NOTE — Telephone Encounter (Signed)
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

## 2020-07-29 NOTE — Telephone Encounter (Signed)
Spoke with pt who does still have her AliveCor monitor to utilize as needed.Pt states her first Zio monitor had to be returned due to malfunction and she is now wearing her second monitor and has another 9 days of wear before returning.  Pt advised to continue monitoring.  Follow up appointment is scheduled for 08/12/2020 with [name].

## 2020-08-09 ENCOUNTER — Telehealth: Payer: Self-pay | Admitting: Internal Medicine

## 2020-08-09 DIAGNOSIS — R001 Bradycardia, unspecified: Secondary | ICD-10-CM | POA: Insufficient documentation

## 2020-08-09 DIAGNOSIS — I4510 Unspecified right bundle-branch block: Secondary | ICD-10-CM | POA: Insufficient documentation

## 2020-08-09 DIAGNOSIS — I493 Ventricular premature depolarization: Secondary | ICD-10-CM | POA: Insufficient documentation

## 2020-08-09 DIAGNOSIS — I480 Paroxysmal atrial fibrillation: Secondary | ICD-10-CM | POA: Diagnosis not present

## 2020-08-09 NOTE — Telephone Encounter (Signed)
Pt received a phone call this morning from our office, no vm was left. Pt has an upcoming appt  08/12/20 Thursday of this week  Please advise pt further

## 2020-08-10 ENCOUNTER — Telehealth: Payer: Self-pay | Admitting: Internal Medicine

## 2020-08-10 NOTE — Telephone Encounter (Signed)
Spoke with pt and advised I do not see where anyone attempted a phone call to pt and advised call was most likely related to a reminder for 08/12/2020 appointment with [name]. Pt verbalizes understanding and thanked Therapist, sports for the call back.

## 2020-08-10 NOTE — Telephone Encounter (Signed)
Abnormal EKG on Zio

## 2020-08-10 NOTE — Telephone Encounter (Signed)
Pt has an appointment 08/12/2020 with [name] for follow up.

## 2020-08-10 NOTE — Telephone Encounter (Signed)
Spoke with Raquel Sarna at [facility] who is calling with end of report results for the patient. She wore the monitor from 7/26 to 8/5. She had an episode of symptomatic bradycardia at 40 bpm for 30 seconds. She also had 6 runs of SVT.  Will forwards to Dr. Caryl Comes and his nurse to make them aware.

## 2020-08-12 ENCOUNTER — Other Ambulatory Visit: Payer: Self-pay

## 2020-08-12 ENCOUNTER — Ambulatory Visit: Payer: Medicare Other | Admitting: Internal Medicine

## 2020-08-12 VITALS — BP 116/68 | HR 82 | Ht 67.5 in | Wt 236.0 lb

## 2020-08-12 DIAGNOSIS — I4510 Unspecified right bundle-branch block: Secondary | ICD-10-CM

## 2020-08-12 DIAGNOSIS — I480 Paroxysmal atrial fibrillation: Secondary | ICD-10-CM

## 2020-08-12 DIAGNOSIS — I493 Ventricular premature depolarization: Secondary | ICD-10-CM

## 2020-08-12 DIAGNOSIS — Z01812 Encounter for preprocedural laboratory examination: Secondary | ICD-10-CM

## 2020-08-12 DIAGNOSIS — R001 Bradycardia, unspecified: Secondary | ICD-10-CM | POA: Diagnosis not present

## 2020-08-12 DIAGNOSIS — Z01818 Encounter for other preprocedural examination: Secondary | ICD-10-CM | POA: Diagnosis not present

## 2020-08-12 NOTE — Progress Notes (Signed)
Patient ID: Veronica Bradford, female   DOB: 07/15/1945, 75 y.o.   MRN: [id number]      Patient Care Team: Colon Branch, MD as PCP - General (Internal Medicine) Marius Ditch, MD as Consulting Physician (Internal Medicine) [name], MD as Consulting Physician (Cardiology) Wylene Simmer, MD as Consulting Physician (Orthopedic Surgery) Armandina Gemma, MD as Consulting Physician (General Surgery) [name], MD as Consulting Physician (Gastroenterology) [place], Heather, Delton as Consulting Physician (Optometry) Sherlynn Stalls, MD as Consulting Physician (Ophthalmology) Roseanne Kaufman, MD as Consulting Physician (Orthopedic Surgery)   HPI  Veronica Bradford is a 75 y.o. female Seen in followup for AFib and now PVCs. Event recorder demonstrated 0-14% PVCs (monomorphic and  nonsustained ventricular tachycardia. Afib ablation (9/19) JA--her impression is that her atrial fibrillation frequency is not much different.  She does not need to get cardioverted however.  Atrial fibrillation seems to be associated in some way with her IBS which is associated with diarrhea.  She has treated sleep apnea with good reports.   anticoagulation with Rivaroxaban and no bleeding issues  More recently has had episodes of atrial fibrillation about once every month or 2 lasting 4 -9 hours, and went into Afib this am atrial fibrillation and is associated with significant weakness and exercise intolerance and freq increases of the HR into the 120-140s.  Dyspnea assoc with exertion, provoked by  1 flight of stairs, < 100 fts  She has also had problems of late with bradycardia with palpitations in her throat associated with fatigue.  An event recorder for 3 days demonstrated (as noted below), freq junctional rhythm and mean heart rates less than 50 She has sensation of throbbing/ pulsating in her neck and can count her pulse In her ears.    No chest pain, nocturnal dyspnea, and no orthopnea.  There have been no syncope.    Has had LE edema of late.  Not quite sure why.  Is on Maxide which is controlling blood pressure well.  No increased sodium intake.  Edema recurrently temporally related to atrial fibrillation  .    She is on Weight Watchers Diet and loss approximately 50 lbs   Date Cr Hgb  K Mag  5/18 0.66 14.7 3.7 2.1  12/21 0.59 13.3 3.9 2.2 6  4/22 0.58 13.7 3.6      DATE TEST EF   9/15 Myoview  65 % No ischemia  8/17 Echo   60-65 %   8/21 Myoview 71% No ischemia  8/21 Echo  60-65% LAE-mod-severe  (42.5 ml/m2)       Event Recorder personnally reviewed   Monitor 8/21>> scant Afib ( 2 hrs) and rare PVCs  Monitor 11/21 >> AFib none   PVCs 6 % Monitor 7/22 heart rate average 52 with a range of 35--122 AM averages 50  Past Medical History:  Diagnosis Date   Atrial fibrillation (HCC)    Back pain    Chronic sinusitis    s/p 2 surgeries remotely, [name]   Closed fracture of metatarsal of left foot    L foot, fifth metatarsal   Ectopic pregnancy    Endometrial cancer ([place]) 2004   Early diagnosis   GERD (gastroesophageal reflux disease)    Hyperlipemia    Hypertension    IBS (irritable bowel syndrome) 08/13/2014   Dx 2015, diarrhea on-off, Dr [name]    Lumbar radiculopathy 08/2012   MRI in 08/2012   Mild hyperparathyroidism ([place])    Osteopenia  PAF (paroxysmal atrial fibrillation) (HCC)    Prediabetes    Sciatica of right side 08/2013   Received Depomedrol 80 mg injection   Sleep apnea 09/09/2013   Dx with OSA in 2015, by Dr. Caryl Comes, APAP    Past Surgical History:  Procedure Laterality Date   ABDOMINAL HYSTERECTOMY     ATRIAL FIBRILLATION ABLATION N/A 09/04/2017   Procedure: [place];  Surgeon: [name], MD;  Location: [facility];  Service: Cardiovascular;  Laterality: N/A;   BIOPSY  03/11/2020   Procedure: BIOPSY;  Surgeon: [name], MD;  Location: [facility];  Service: Endoscopy;;  EGD and COLON   CARPAL TUNNEL RELEASE Right 06/17/2019    CHOLECYSTECTOMY  ~2006   COLONOSCOPY WITH PROPOFOL N/A 03/11/2020   Procedure: COLONOSCOPY WITH PROPOFOL;  Surgeon: [name], MD;  Location: [facility];  Service: Endoscopy;  Laterality: N/A;   DILATION AND CURETTAGE OF UTERUS     after SAB   ESOPHAGOGASTRODUODENOSCOPY  02/09/2014   Dr. [name]   ESOPHAGOGASTRODUODENOSCOPY (EGD) WITH PROPOFOL N/A 03/11/2020   Procedure: ESOPHAGOGASTRODUODENOSCOPY (EGD) WITH PROPOFOL;  Surgeon: [name], MD;  Location: [facility];  Service: Endoscopy;  Laterality: N/A;   FINGER SURGERY Left    index   NASAL SINUS SURGERY     x 2 remotely, [name]   TONSILLECTOMY     TOTAL ABDOMINAL HYSTERECTOMY W/ BILATERAL SALPINGOOPHORECTOMY     TOTAL KNEE ARTHROPLASTY Right ~2008    Current Outpatient Medications  Medication Sig Dispense Refill   acetaminophen (TYLENOL) 325 MG tablet Take 325 mg by mouth every 6 (six) hours as needed for moderate pain.     acetaminophen-codeine (TYLENOL #3) 300-30 MG tablet Take 1 tablet by mouth daily as needed for moderate pain. 30 tablet 0   Chromium Picolinate 1000 MCG TABS Take 1,000 mcg by mouth every evening.     Coenzyme Q10 (CO Q 10) 100 MG CAPS Take 100 mg by mouth daily.      colestipol (COLESTID) 1 g tablet Take 1 g by mouth 2 (two) times daily.     Cranberry 600 MG TABS Take 600 mg by mouth daily.     diltiazem (CARDIZEM) 30 MG tablet Take 1 tablet every 4 hours AS NEEDED for heart rate >100 as long as blood pressure >100. (Patient taking differently: Take 30 mg by mouth daily as needed (Afib). Take 1 tablet every 4 hours AS NEEDED for heart rate >100 as long as blood pressure >100.) 45 tablet 5   flecainide (TAMBOCOR) 50 MG tablet Take 1 tablet (50 mg total) by mouth 2 (two) times daily. 180 tablet 3   fluticasone (FLONASE) 50 MCG/ACT nasal spray Place 1 spray into both nostrils daily.     furosemide (LASIX) 20 MG tablet Take 20 mg by mouth daily as needed for fluid.     hydroxypropyl methylcellulose /  hypromellose (ISOPTO TEARS / GONIOVISC) 2.5 % ophthalmic solution Place 1 drop into both eyes 3 (three) times daily as needed for dry eyes.     loperamide (IMODIUM A-D) 2 MG tablet Take 2 mg by mouth daily.     pantoprazole (PROTONIX) 40 MG tablet Take 1 tablet (40 mg total) by mouth daily. 30 tablet 3   pravastatin (PRAVACHOL) 40 MG tablet Take 1 tablet (40 mg total) by mouth at bedtime. 90 tablet 1   Pyridoxine HCl (VITAMIN B6) 200 MG TABS Take 200 tablets by mouth daily.     rivaroxaban (XARELTO) 20 MG TABS tablet  Take 1 tablet (20 mg total) by mouth daily with supper. 90 tablet 3   sucralfate (CARAFATE) 1 g tablet Take 1 tablet (1 g total) by mouth 4 (four) times daily -  with meals and at bedtime. 120 tablet 1   triamterene-hydrochlorothiazide (MAXZIDE-25) 37.5-25 MG tablet Take 0.5 tablets by mouth daily. 45 tablet 1   vitamin B-12 (CYANOCOBALAMIN) 1000 MCG tablet Take 2,000 mcg by mouth every evening.      Vitamin D, Ergocalciferol, (DRISDOL) 1.25 MG (50000 UNIT) CAPS capsule Take 1 capsule (50,000 Units total) by mouth every 7 (seven) days. 12 capsule 0   No current facility-administered medications for this visit.    Allergies  Allergen Reactions   Lisinopril     Other reaction(s): cough   Nsaids Other (See Comments)    Pt on Xarelto   Penicillins     Local reaction   Tolmetin Other (See Comments)    Pt on Xarelto   Zoster Vaccine Live Other (See Comments)    Sickness per Pt.    Amoxicillin Rash    Review of Systems negative except from HPI and PMH  Physical Exam: BP 116/68   Pulse 82   Ht 5' 7.5" (1.715 m)   Wt 236 lb (107 kg)   SpO2 98%   BMI 36.42 kg/m   Well developed and nourished in no acute distress HENT normal Neck supple with JVP-  7 Lungs Clear Irregularly irregular rate and rhythm, no murmurs or gallops Abd-soft with active BS No Clubbing cyanosis 2+  edema Skin-warm and dry A & Oriented  Grossly normal sensory and motor function  ECG: Atrial  fibrillation at 82 bpm expected we will-/17/42 Right bundle branch left axis deviation   Assessment and  Plan  Atrial fibrillation-paroxysmal   CHADS-VASc score 2 (age/gender)    Obesity  RBBB/left axis deviation  PVCs  Sinus bradycardia/junctional rhythm  The patient has symptomatic sinus node dysfunction with frequent junctional rhythm bradycardia and chronotropic incompetence.  Pacing is indicated for relief of symptoms.  Moreover, it will allow more aggressive therapies for control of her paroxysmal atrial fibrillation associated with its rapid rates.  Right now she is in atrial fibrillation which is the bigger problem compared to her junctional rhythm; hence, we will undertake cardioversion.  Thereafter, we will need to wait 3 weeks or so prior to holding her anticoagulation to allow for pacemaker implantation.  In the event that she converted spontaneously we can reschedule a pacemaker more expeditiously.  Dual-chamber pacing will be indicated.  We will also have her increase her furosemide given her volume overload.  Have her take Lasix 20 mg a day until resolution.  Diet is already well salt deplete      I,Stephanie Williams,acting as a scribe for Virl Axe, MD.,have documented all relevant documentation on the behalf of Virl Axe, MD,as directed by  Virl Axe, MD while in the presence of Virl Axe, MD.  I, Virl Axe, MD, have reviewed all documentation for this visit. The documentation on 08/12/20 for the exam, diagnosis, procedures, and orders are all accurate and complete.

## 2020-08-12 NOTE — Patient Instructions (Signed)
Medication Instructions:  Your physician recommends that you continue on your current medications as directed. Please refer to the Current Medication list given to you today.  *If you need a refill on your cardiac medications before your next appointment, please call your pharmacy*   Lab Work: CBC and BMET today  If you have labs (blood work) drawn today and your tests are completely normal, you will receive your results only by: [place] (if you have MyChart) OR A paper copy in the mail If you have any lab test that is abnormal or we need to change your treatment, we will call you to review the results.   Testing/Procedures: Your physician has recommended that you have a Cardioversion (DCCV). Electrical Cardioversion uses a jolt of electricity to your heart either through paddles or wired patches attached to your chest. This is a controlled, usually prescheduled, procedure. Defibrillation is done under light anesthesia in the hospital, and you usually go home the day of the procedure. This is done to get your heart back into a normal rhythm. You are not awake for the procedure. Please see the instruction sheet given to you today.   Your physician has recommended that you have a pacemaker inserted. A pacemaker is a small device that is placed under the skin of your chest or abdomen to help control abnormal heart rhythms. This device uses electrical pulses to prompt the heart to beat at a normal rate. Pacemakers are used to treat heart rhythms that are too slow. Wire (leads) are attached to the pacemaker that goes into the chambers of you heart. This is done in the hospital and usually requires and overnight stay. Please see the instruction sheet given to you today for more information.    Follow-Up: At [facility], you and your health needs are our priority.  As part of our continuing mission to provide you with exceptional heart care, we have created designated Provider Care Teams.   These Care Teams include your primary Cardiologist (physician) and Advanced Practice Providers (APPs -  Physician Assistants and Nurse Practitioners) who all work together to provide you with the care you need, when you need it.  We recommend signing up for the patient portal called "MyChart".  Sign up information is provided on this After Visit Summary.  MyChart is used to connect with patients for Virtual Visits (Telemedicine).  Patients are able to view lab/test results, encounter notes, upcoming appointments, etc.  Non-urgent messages can be sent to your provider as well.   To learn more about what you can do with MyChart, go to [email or web address].    Your next appointment:   To be scheduled   Other Instructions Dear Veronica Bradford are scheduled for a Cardioversion on Thursday 08/19/2020 with Dr. Jenkins Rouge.  Please arrive at the [facility] (Main Entrance A) at [facility]: [place], [place] [address] at 730am.   DIET: Nothing to eat or drink after midnight except a sip of water with medications (see medication instructions below)  FYI: For your safety, and to allow [place] to monitor your vital signs accurately during the surgery/procedure we request that   if you have artificial nails, gel coating, SNS etc. Please have those removed prior to your surgery/procedure. Not having the nail coverings /polish removed may result in cancellation or delay of your surgery/procedure.   Medication Instructions: Hold all medications the morning of your procedure.  Continue your anticoagulant: Xarelto You will need to continue  your anticoagulant after your procedure until you  are told by your Provider that it is safe to stop   Labs: CBC and BMET today   You must have a responsible person to drive you home and stay in the waiting area during your procedure. Failure to do so could result in cancellation.  Bring your insurance cards.  *Special Note: Every effort is  made to have your procedure done on time. Occasionally there are emergencies that occur at the hospital that may cause delays. Please be patient if a delay does occur.

## 2020-08-13 ENCOUNTER — Telehealth: Payer: Self-pay | Admitting: Internal Medicine

## 2020-08-13 LAB — CBC
Hematocrit: 37.9 % (ref 34.0–46.6)
Hemoglobin: 11.8 g/dL (ref 11.1–15.9)
MCH: 26.5 pg — ABNORMAL LOW (ref 26.6–33.0)
MCHC: 31.1 g/dL — ABNORMAL LOW (ref 31.5–35.7)
MCV: 85 fL (ref 79–97)
Platelets: 281 10*3/uL (ref 150–450)
RBC: 4.46 x10E6/uL (ref 3.77–5.28)
RDW: 13.6 % (ref 11.7–15.4)
WBC: 7.4 10*3/uL (ref 3.4–10.8)

## 2020-08-13 LAB — BASIC METABOLIC PANEL
BUN/Creatinine Ratio: 13 (ref 12–28)
BUN: 9 mg/dL (ref 8–27)
CO2: 26 mmol/L (ref 20–29)
Calcium: 10.6 mg/dL — ABNORMAL HIGH (ref 8.7–10.3)
Chloride: 110 mmol/L — ABNORMAL HIGH (ref 96–106)
Creatinine, Ser: 0.67 mg/dL (ref 0.57–1.00)
Glucose: 95 mg/dL (ref 65–99)
Potassium: 3.9 mmol/L (ref 3.5–5.2)
Sodium: 149 mmol/L — ABNORMAL HIGH (ref 134–144)
eGFR: 93 mL/min/{1.73_m2} (ref >59)

## 2020-08-13 MED ORDER — DILTIAZEM HCL 30 MG PO TABS: ORAL_TABLET | ORAL | 5 refills | Status: DC

## 2020-08-13 NOTE — Telephone Encounter (Signed)
Took EKG to Dr. Caryl Comes for review. Verified NSR. Planned to cancel cardioversion. Asked to hold PPM implant slot on schedule for patient on Sept 7, 2022 at 8:30 am and have Rosann Auerbach, RN call next week to set up.   Notified patient of plan. Advised to call office if she goes back into atrial fibrillation. Verbalized agreement and understanding.   Canceled Cardioversion.  Forward to Dr. Olin Pia nurse for follow up.

## 2020-08-13 NOTE — Telephone Encounter (Signed)
Veronica Bradford is calling wanting to speak with a nurse in regards to her upcoming procedure.

## 2020-08-13 NOTE — Telephone Encounter (Signed)
See my chart message

## 2020-08-17 ENCOUNTER — Telehealth: Payer: Self-pay

## 2020-08-17 NOTE — Telephone Encounter (Signed)
poke with pt and advised per [name] labs are normal with exception of increase sodium and calcium level.  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   [name] advised the above and states pt may continue her follow up with these providers for the elevated Na+ and Ca and above testing will not be necessary at this time.

## 2020-08-17 NOTE — Telephone Encounter (Signed)
-----   Message from [name], MD sent at 08/14/2020  3:04 PM EDT ----- Please Inform Patient  Labs are normal x increased sodium--maybe benefit from increased fluids   Will need repeat CA , ionized calcium and bmet in two weeks   She can be scheduled for PM implant -- 9/7 or we can look at another MD doing it    Thanks

## 2020-08-19 ENCOUNTER — Encounter (HOSPITAL_COMMUNITY): Admission: RE | Payer: Self-pay | Source: Home / Self Care

## 2020-08-19 ENCOUNTER — Ambulatory Visit (HOSPITAL_COMMUNITY): Admission: RE | Admit: 2020-08-19 | Payer: Medicare Other | Source: Home / Self Care | Admitting: Cardiovascular Disease

## 2020-08-19 DIAGNOSIS — Z01812 Encounter for preprocedural laboratory examination: Secondary | ICD-10-CM

## 2020-08-19 DIAGNOSIS — R001 Bradycardia, unspecified: Secondary | ICD-10-CM

## 2020-08-19 DIAGNOSIS — I480 Paroxysmal atrial fibrillation: Secondary | ICD-10-CM

## 2020-08-19 DIAGNOSIS — I4510 Unspecified right bundle-branch block: Secondary | ICD-10-CM

## 2020-08-19 SURGERY — CARDIOVERSION
Anesthesia: General

## 2020-08-24 ENCOUNTER — Telehealth: Payer: Self-pay

## 2020-08-24 NOTE — Telephone Encounter (Signed)
Attempted phone call to pt to review PPM Instruction letter.  Left message to contact RN at [phone].

## 2020-08-25 MED ORDER — FUROSEMIDE 20 MG PO TABS: 20.0000 mg | ORAL_TABLET | Freq: Every day | ORAL | 1 refills | Status: DC | PRN

## 2020-08-25 NOTE — Addendum Note (Signed)
Addended by: Thora Lance on: 08/25/2020 08:13 AM   Modules accepted: Orders

## 2020-09-08 ENCOUNTER — Other Ambulatory Visit: Payer: Self-pay | Admitting: Internal Medicine

## 2020-09-09 ENCOUNTER — Encounter: Payer: Self-pay | Admitting: Internal Medicine

## 2020-09-09 ENCOUNTER — Other Ambulatory Visit: Payer: Medicare Other | Admitting: *Deleted

## 2020-09-09 ENCOUNTER — Other Ambulatory Visit: Payer: Self-pay

## 2020-09-09 DIAGNOSIS — I480 Paroxysmal atrial fibrillation: Secondary | ICD-10-CM | POA: Diagnosis not present

## 2020-09-09 DIAGNOSIS — I4510 Unspecified right bundle-branch block: Secondary | ICD-10-CM

## 2020-09-09 DIAGNOSIS — R001 Bradycardia, unspecified: Secondary | ICD-10-CM

## 2020-09-09 DIAGNOSIS — Z01812 Encounter for preprocedural laboratory examination: Secondary | ICD-10-CM | POA: Diagnosis not present

## 2020-09-09 LAB — BASIC METABOLIC PANEL
BUN/Creatinine Ratio: 18 (ref 12–28)
BUN: 13 mg/dL (ref 8–27)
CO2: 31 mmol/L — ABNORMAL HIGH (ref 20–29)
Calcium: 10.6 mg/dL — ABNORMAL HIGH (ref 8.7–10.3)
Chloride: 103 mmol/L (ref 96–106)
Creatinine, Ser: 0.71 mg/dL (ref 0.57–1.00)
Glucose: 97 mg/dL (ref 65–99)
Potassium: 4 mmol/L (ref 3.5–5.2)
Sodium: 139 mmol/L (ref 134–144)
eGFR: 90 mL/min/{1.73_m2} (ref >59)

## 2020-09-09 LAB — CBC
Hematocrit: 32.1 % — ABNORMAL LOW (ref 34.0–46.6)
Hemoglobin: 10.2 g/dL — ABNORMAL LOW (ref 11.1–15.9)
MCH: 25.3 pg — ABNORMAL LOW (ref 26.6–33.0)
MCHC: 31.8 g/dL (ref 31.5–35.7)
MCV: 80 fL (ref 79–97)
Platelets: 258 10*3/uL (ref 150–450)
RBC: 4.03 x10E6/uL (ref 3.77–5.28)
RDW: 16 % — ABNORMAL HIGH (ref 11.7–15.4)
WBC: 6.1 10*3/uL (ref 3.4–10.8)

## 2020-09-10 DIAGNOSIS — I480 Paroxysmal atrial fibrillation: Secondary | ICD-10-CM | POA: Diagnosis not present

## 2020-09-10 DIAGNOSIS — K2970 Gastritis, unspecified, without bleeding: Secondary | ICD-10-CM | POA: Diagnosis not present

## 2020-09-10 DIAGNOSIS — D500 Iron deficiency anemia secondary to blood loss (chronic): Secondary | ICD-10-CM | POA: Diagnosis not present

## 2020-09-10 DIAGNOSIS — K580 Irritable bowel syndrome with diarrhea: Secondary | ICD-10-CM | POA: Diagnosis not present

## 2020-09-13 ENCOUNTER — Ambulatory Visit (HOSPITAL_COMMUNITY)
Admission: RE | Admit: 2020-09-13 | Discharge: 2020-09-13 | Disposition: A | Discharge status: Home / Self Care | Payer: Medicare Other | Attending: Internal Medicine | Admitting: Internal Medicine

## 2020-09-13 ENCOUNTER — Other Ambulatory Visit: Payer: Self-pay

## 2020-09-13 ENCOUNTER — Ambulatory Visit (HOSPITAL_COMMUNITY)
Admission: RE | Disposition: A | Discharge status: Home / Self Care | Payer: Self-pay | Source: Home / Self Care | Attending: Internal Medicine

## 2020-09-13 ENCOUNTER — Ambulatory Visit (HOSPITAL_COMMUNITY): Payer: Medicare Other

## 2020-09-13 DIAGNOSIS — Z6836 Body mass index (BMI) 36.0-36.9, adult: Secondary | ICD-10-CM | POA: Insufficient documentation

## 2020-09-13 DIAGNOSIS — I495 Sick sinus syndrome: Secondary | ICD-10-CM | POA: Diagnosis not present

## 2020-09-13 DIAGNOSIS — Z886 Allergy status to analgesic agent status: Secondary | ICD-10-CM | POA: Insufficient documentation

## 2020-09-13 DIAGNOSIS — Z950 Presence of cardiac pacemaker: Secondary | ICD-10-CM | POA: Diagnosis not present

## 2020-09-13 DIAGNOSIS — Z7901 Long term (current) use of anticoagulants: Secondary | ICD-10-CM | POA: Insufficient documentation

## 2020-09-13 DIAGNOSIS — Z79899 Other long term (current) drug therapy: Secondary | ICD-10-CM | POA: Insufficient documentation

## 2020-09-13 DIAGNOSIS — Z888 Allergy status to other drugs, medicaments and biological substances status: Secondary | ICD-10-CM | POA: Insufficient documentation

## 2020-09-13 DIAGNOSIS — I4510 Unspecified right bundle-branch block: Secondary | ICD-10-CM | POA: Insufficient documentation

## 2020-09-13 DIAGNOSIS — Z9889 Other specified postprocedural states: Secondary | ICD-10-CM | POA: Diagnosis not present

## 2020-09-13 DIAGNOSIS — E669 Obesity, unspecified: Secondary | ICD-10-CM | POA: Insufficient documentation

## 2020-09-13 DIAGNOSIS — Z880 Allergy status to penicillin: Secondary | ICD-10-CM | POA: Insufficient documentation

## 2020-09-13 DIAGNOSIS — I480 Paroxysmal atrial fibrillation: Secondary | ICD-10-CM | POA: Diagnosis not present

## 2020-09-13 DIAGNOSIS — Z959 Presence of cardiac and vascular implant and graft, unspecified: Secondary | ICD-10-CM

## 2020-09-13 DIAGNOSIS — Z87891 Personal history of nicotine dependence: Secondary | ICD-10-CM | POA: Diagnosis not present

## 2020-09-13 HISTORY — PX: PACEMAKER IMPLANT: EP1218

## 2020-09-13 SURGERY — PACEMAKER IMPLANT

## 2020-09-13 MED ORDER — ACETAMINOPHEN 325 MG PO TABS
325.0000 mg | ORAL_TABLET | ORAL | Status: DC | PRN
  Filled 2020-09-13: qty 2

## 2020-09-13 MED ORDER — HEPARIN (PORCINE) IN NACL 1000-0.9 UT/500ML-% IV SOLN
INTRAVENOUS | Status: DC | PRN
  Administered 2020-09-13: 500 mL

## 2020-09-13 MED ORDER — MIDAZOLAM HCL 5 MG/5ML IJ SOLN
INTRAMUSCULAR | Status: DC | PRN
  Administered 2020-09-13 (×2): 1 mg via INTRAVENOUS
  Administered 2020-09-13: 2 mg via INTRAVENOUS
  Administered 2020-09-13 (×2): 1 mg via INTRAVENOUS

## 2020-09-13 MED ORDER — MIDAZOLAM HCL 5 MG/5ML IJ SOLN
INTRAMUSCULAR | Status: AC
  Filled 2020-09-13: qty 5

## 2020-09-13 MED ORDER — FENTANYL CITRATE (PF) 100 MCG/2ML IJ SOLN
INTRAMUSCULAR | Status: DC | PRN
  Administered 2020-09-13 (×4): 25 ug via INTRAVENOUS

## 2020-09-13 MED ORDER — FENTANYL CITRATE (PF) 100 MCG/2ML IJ SOLN
INTRAMUSCULAR | Status: AC
  Filled 2020-09-13: qty 2

## 2020-09-13 MED ORDER — LIDOCAINE HCL 1 % IJ SOLN
INTRAMUSCULAR | Status: AC
  Filled 2020-09-13: qty 60

## 2020-09-13 MED ORDER — SODIUM CHLORIDE 0.9 % IV SOLN
80.0000 mg | INTRAVENOUS | Status: AC
  Administered 2020-09-13: 80 mg
  Filled 2020-09-13: qty 2

## 2020-09-13 MED ORDER — SODIUM CHLORIDE 0.9 % IV SOLN: INTRAVENOUS | Status: AC

## 2020-09-13 MED ORDER — SODIUM CHLORIDE 0.9 % IV SOLN: INTRAVENOUS | Status: DC

## 2020-09-13 MED ORDER — LIDOCAINE HCL (PF) 1 % IJ SOLN
INTRAMUSCULAR | Status: DC | PRN
  Administered 2020-09-13: 60 mL

## 2020-09-13 MED ORDER — SODIUM CHLORIDE 0.9 % IV SOLN
INTRAVENOUS | Status: AC
  Filled 2020-09-13: qty 2

## 2020-09-13 MED ORDER — VANCOMYCIN HCL 1500 MG/300ML IV SOLN
1500.0000 mg | INTRAVENOUS | Status: AC
  Administered 2020-09-13: 1500 mg via INTRAVENOUS
  Filled 2020-09-13: qty 300

## 2020-09-13 MED ORDER — ACETAMINOPHEN 325 MG PO TABS
ORAL_TABLET | ORAL | Status: AC
  Administered 2020-09-13: 650 mg
  Filled 2020-09-13: qty 2

## 2020-09-13 MED ORDER — HEPARIN (PORCINE) IN NACL 1000-0.9 UT/500ML-% IV SOLN
INTRAVENOUS | Status: AC
  Filled 2020-09-13: qty 500

## 2020-09-13 MED ORDER — DILTIAZEM HCL ER COATED BEADS 120 MG PO CP24: 120.0000 mg | ORAL_CAPSULE | Freq: Every day | ORAL | 6 refills | Status: DC

## 2020-09-13 MED ORDER — VANCOMYCIN HCL IN DEXTROSE 1-5 GM/200ML-% IV SOLN
INTRAVENOUS | Status: AC
  Filled 2020-09-13: qty 200

## 2020-09-13 SURGICAL SUPPLY — 13 items
CABLE SURGICAL S-101-97-12 (CABLE) ×2 IMPLANT
CATH RIGHTSITE C315HIS02 (CATHETERS) ×1 IMPLANT
HEMOSTAT SURGICEL 2X4 FIBR (HEMOSTASIS) ×1 IMPLANT
LEAD SELECT SECURE 3830 383069 (Lead) IMPLANT
LEAD TENDRIL MRI 52CM LPA1200M (Lead) ×1 IMPLANT
PACEMAKER ASSURITY DR-RF (Pacemaker) ×1 IMPLANT
PAD PRO RADIOLUCENT 2001M-C (PAD) ×2 IMPLANT
SELECT SECURE 3830 383069 (Lead) ×2 IMPLANT
SHEATH 7FR PRELUDE SNAP 13 (SHEATH) ×1 IMPLANT
SHEATH 8FR PRELUDE SNAP 13 (SHEATH) ×1 IMPLANT
SLITTER 6232ADJ (MISCELLANEOUS) ×1 IMPLANT
TRAY PACEMAKER INSERTION (PACKS) ×2 IMPLANT
WIRE HI TORQ VERSACORE-J 145CM (WIRE) ×1 IMPLANT

## 2020-09-13 NOTE — Progress Notes (Signed)
Pt ambulated without difficulty or bleeding.   Discharged home with her husband who will drive and stay with pt x 24 hrs. 

## 2020-09-13 NOTE — Discharge Instructions (Signed)
    Supplemental Discharge Instructions for  Pacemaker/Defibrillator Patients  Tomorrow, 09/14/20, send in a device transmission  Activity No heavy lifting or vigorous activity with your left/right arm for 6 to 8 weeks.  Do not raise your left/right arm above your head for one week.  Gradually raise your affected arm as drawn below.             09/18/20                    09/19/20                    09/20/20                  09/21/20 __  NO DRIVING for  1 week   ; you may begin driving on   [id number]  .  WOUND CARE Keep the wound area clean and dry.  Do not get this area wet , no showers for 24 hours, you may shower on 09/14/20 evening  . Tomorrow, 09/14/20, remove the arm sling Dr. Caryl Comes used DERMABOND to close your wound.  DO NOT peel this off.  Do not rub/scrub the area, pat dry. No bandage is needed on the site.  DO  NOT apply any creams, oils, or ointments to the wound area. If you notice any drainage or discharge from the wound, any swelling or bruising at the site, or you develop a fever > 101? F after you are discharged home, call the office at once.  Special Instructions You are still able to use cellular telephones; use the ear opposite the side where you have your pacemaker/defibrillator.  Avoid carrying your cellular phone near your device. When traveling through airports, show security personnel your identification card to avoid being screened in the metal detectors.  Ask the security personnel to use the hand wand. Avoid arc welding equipment, MRI testing (magnetic resonance imaging), TENS units (transcutaneous nerve stimulators).  Call the office for questions about other devices. Avoid electrical appliances that are in poor condition or are not properly grounded. Microwave ovens are safe to be near or to operate.

## 2020-09-13 NOTE — H&P (Signed)
Patient Care Team: Colon Branch, MD as PCP - General (Internal Medicine) Marius Ditch, MD as Consulting Physician (Internal Medicine) [name], MD as Consulting Physician (Cardiology) Wylene Simmer, MD as Consulting Physician (Orthopedic Surgery) Armandina Gemma, MD as Consulting Physician (General Surgery) [name], MD as Consulting Physician (Gastroenterology) [place], [place], [place] as Consulting Physician (Optometry) Sherlynn Stalls, MD as Consulting Physician (Ophthalmology) Roseanne Kaufman, MD as Consulting Physician (Orthopedic Surgery)   HPI  Veronica Bradford is a 75 y.o. 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  Also with PAR and RVR, treatment of which is limited by bradycardia  Interval afib with spontaneous reversion  Denies dyspnea at rest But complaining of fatigue and some cloudiness of thought  No chest pain  Symptomatic PVCs   Anemia with ongoing eval.   Date Cr Hgb  K Mag  5/18 0.66 14.7 3.7 2.1  12/21 0.59 13.3 3.9 2.2 6  4/22 0.58 13.7 3.6    9/22 0.7 10.2 4.0       DATE TEST EF    9/15 Myoview  65 % No ischemia  8/17 Echo   60-65 %    8/21 Myoview 71% No ischemia  8/21 Echo  60-65% LAE-mod-severe  (42.5 ml/m2)             Records and Results Reviewed   Past Medical History:  Diagnosis Date   Atrial fibrillation (HCC)    Back pain    Chronic sinusitis    s/p 2 surgeries remotely, [name]   Closed fracture of metatarsal of left foot    L foot, fifth metatarsal   Ectopic pregnancy    Endometrial cancer ([place]) 2004   Early diagnosis   GERD (gastroesophageal reflux disease)    Hyperlipemia    Hypertension    IBS (irritable bowel syndrome) 08/13/2014   Dx 2015, diarrhea on-off, Dr [name]    Lumbar radiculopathy 08/2012   MRI in 08/2012   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 Caryl Comes, APAP    Past Surgical History:  Procedure Laterality Date   ABDOMINAL HYSTERECTOMY     ATRIAL FIBRILLATION ABLATION N/A 09/04/2017   Procedure: [place];  Surgeon: [name], MD;  Location: [facility];  Service: Cardiovascular;  Laterality: N/A;   BIOPSY  03/11/2020   Procedure: BIOPSY;  Surgeon: [name], MD;  Location: [facility];  Service: Endoscopy;;  EGD and COLON   CARPAL TUNNEL RELEASE Right 06/17/2019   CHOLECYSTECTOMY  ~2006   COLONOSCOPY WITH PROPOFOL N/A 03/11/2020   Procedure: COLONOSCOPY WITH PROPOFOL;  Surgeon: [name], MD;  Location: [facility];  Service: Endoscopy;  Laterality: N/A;   DILATION AND CURETTAGE OF UTERUS     after SAB   ESOPHAGOGASTRODUODENOSCOPY  02/09/2014   Dr. [name]   ESOPHAGOGASTRODUODENOSCOPY (EGD) WITH PROPOFOL N/A 03/11/2020   Procedure: ESOPHAGOGASTRODUODENOSCOPY (EGD) WITH PROPOFOL;  Surgeon: [name], MD;  Location: [facility];  Service: Endoscopy;  Laterality: N/A;   FINGER SURGERY Left    index   NASAL SINUS SURGERY     x 2 remotely, [name]   TONSILLECTOMY     TOTAL ABDOMINAL HYSTERECTOMY W/ BILATERAL SALPINGOOPHORECTOMY  TOTAL KNEE ARTHROPLASTY Right ~2008    Current Facility-Administered Medications  Medication Dose Route Frequency Provider Last Rate Last Admin   0.9 %  sodium chloride infusion   Intravenous Continuous [name], MD 50 mL/hr at 09/13/20 1108 New Bag at 09/13/20 1108   gentamicin (GARAMYCIN) 80 mg in sodium chloride 0.9 % 500 mL irrigation  80 mg Irrigation On Call [name], MD       vancomycin (VANCOREADY) IVPB 1500 mg/300 mL  1,500 mg Intravenous On Call [name], MD        Allergies  Allergen Reactions   Lactose Intolerance (Gi) Diarrhea and Nausea And Vomiting   Lisinopril Cough   Nsaids Other (See Comments)    Pt on Xarelto    Tape     Causes blisters    Tolmetin Other (See Comments)    Pt on Xarelto   Zoster Vaccine Live Other (See Comments)    Serum sickness, high fever, neck pain    Amoxicillin Rash   Penicillins Rash    Local reaction      Social History   Tobacco Use   Smoking status: Former    Types: Cigarettes    Quit date: 01/06/1980    Years since quitting: 40.7   Smokeless tobacco: Never   Tobacco comments:    smoked from 1970 to 1982, less than 1 ppd  Vaping Use   Vaping Use: Never used  Substance Use Topics   Alcohol use: Yes    Comment: rare   Drug use: No     Family History  Problem Relation Age of Onset   Hypertension Mother    Colon cancer Mother    Breast cancer Mother    Ovarian cancer Mother    Hypertension Father    Diabetes Father    Head & neck cancer Sister    CAD Neg Hx      Current Meds  Medication Sig   acetaminophen (TYLENOL) 500 MG tablet Take 500 mg by mouth every 6 (six) hours as needed for moderate pain or headache.   acetaminophen-codeine (TYLENOL #3) 300-30 MG tablet Take 1 tablet by mouth daily as needed for moderate pain.   ARTIFICIAL TEAR SOLUTION OP Place 1 drop into both eyes 4 (four) times daily.   cholecalciferol (VITAMIN D3) 25 MCG (1000 UNIT) tablet Take 1,000 Units by mouth daily.   Coenzyme Q10 300 MG CAPS Take 300 mg by mouth daily.   diltiazem (CARDIZEM) 30 MG tablet Take 1 tablet every 4 hours AS NEEDED for heart rate >100 as long as blood pressure >100.   Fish Oil-Cholecalciferol (OMEGA-3 + D PO) Take 3 capsules by mouth daily.   flecainide (TAMBOCOR) 50 MG tablet Take 1 tablet (50 mg total) by mouth 2 (two) times daily. (Patient taking differently: Take 50-100 mg by mouth See admin instructions. Take 50 mg twice daily, may take an additional 100 mg dose as needed for breakthrough afib)   fluticasone (FLONASE) 50 MCG/ACT nasal spray Place 1 spray into both nostrils every other day.   furosemide (LASIX) 20 MG tablet Take 1 tablet (20 mg  total) by mouth daily as needed for fluid. (Patient taking differently: Take 20-40 mg by mouth daily as needed for fluid.)   loperamide (IMODIUM A-D) 2 MG tablet Take 2-4 mg by mouth 2 (two) times daily as needed for diarrhea or loose stools.   Misc Natural Products (CYSTEX URINARY HEALTH PO) Take 1 capsule by mouth daily.  pantoprazole (PROTONIX) 40 MG tablet Take 1 tablet (40 mg total) by mouth daily.   pravastatin (PRAVACHOL) 40 MG tablet Take 1 tablet (40 mg total) by mouth at bedtime.   pyridOXINE (VITAMIN B-6) 100 MG tablet Take 100 mg by mouth daily.   rivaroxaban (XARELTO) 20 MG TABS tablet Take 1 tablet (20 mg total) by mouth daily with supper.   sucralfate (CARAFATE) 1 g tablet Take 1 g by mouth 4 (four) times daily -  with meals and at bedtime.   triamterene-hydrochlorothiazide (MAXZIDE-25) 37.5-25 MG tablet Take 0.5 tablets by mouth daily.   [DISCONTINUED] triamterene-hydrochlorothiazide (MAXZIDE-25) 37.5-25 MG tablet Take 0.5 tablets by mouth daily.     Review of Systems negative except from HPI and PMH  Physical Exam BP (!) 158/86   Pulse 68   Temp 98.6 F (37 C)   Resp 18   Ht 5' 7.5" (1.715 m)   Wt 107 kg   SpO2 99%   BMI 36.40 kg/m  Well developed and well nourished in no acute distress HENT normal E scleral and icterus clear Neck Supple JVP flat; carotids brisk and full Clear to ausculation Regular rate and rhythm, no murmurs gallops or rub Soft with active bowel sounds No clubbing cyanosis  Edema Alert and oriented, grossly normal motor and sensory function Skin Warm and Dry    Assessment and  Plan Sinus bradycardia/junctional rhythm-symptomatic  Atrial fibrillation-paroxysmal    CHADS-VASc score 2 (age/gender)    Obesity   RBBB/left axis deviation   PVCs   For pacemaker insertion for symptomatic resting bradycardia 2/2 sinus node dysfunction and junctional rhythm  Also will allow more aggressive therapy for atrial fibrillation  Her  cognitive cloudiness may be a drug effect and serial exclusion is reasonable to see if we can identify the culprit   The benefits and risks were reviewed including but not limited to death,  perforation, infection, lead dislodgement and device malfunction.  The patient understands agrees and is willing to proceed.

## 2020-09-14 ENCOUNTER — Telehealth: Payer: Self-pay

## 2020-09-14 ENCOUNTER — Encounter (HOSPITAL_COMMUNITY): Payer: Self-pay | Admitting: Internal Medicine

## 2020-09-14 MED FILL — Lidocaine HCl Local Inj 1%: INTRAMUSCULAR | Qty: 60 | Status: AC

## 2020-09-14 NOTE — Telephone Encounter (Signed)
Follow-up after same day discharge: Implant date: 09/13/20 MD: Virl Axe, MD Device: St. Jude Assurity Dual-chamber PPM Location: Left Chest   Wound check visit: 09/23/20 @ 08:40 90 day MD follow-up: 12/14/20 @ 2:00  Remote Transmission received: No  Dressing removed: Yes-dermabond remains intact  Successful telephone encounter to patient s/p ppm implant yesterday. Patient states she is doing well. Minimal swelling and discomfort today. Advised she may use ice pack (not directly on skin) for discomfort and mild swelling/bruising and tylenol as previously taken for discomfort. She has been ordered a remote monitor and per industry mailed today and patient should receive tomorrow. Instructions on how to send manual transmission sent via [place]. Patient provided device clinic contact and encouraged to call with any additional questions or concerns.

## 2020-09-15 ENCOUNTER — Telehealth: Payer: Self-pay

## 2020-09-15 NOTE — Telephone Encounter (Signed)
LMOVM letting patient know that we received her transmission.

## 2020-09-21 DIAGNOSIS — D1801 Hemangioma of skin and subcutaneous tissue: Secondary | ICD-10-CM | POA: Diagnosis not present

## 2020-09-21 DIAGNOSIS — L814 Other melanin hyperpigmentation: Secondary | ICD-10-CM | POA: Diagnosis not present

## 2020-09-21 DIAGNOSIS — L821 Other seborrheic keratosis: Secondary | ICD-10-CM | POA: Diagnosis not present

## 2020-09-21 DIAGNOSIS — L720 Epidermal cyst: Secondary | ICD-10-CM | POA: Diagnosis not present

## 2020-09-23 ENCOUNTER — Other Ambulatory Visit: Payer: Self-pay

## 2020-09-23 ENCOUNTER — Ambulatory Visit (INDEPENDENT_AMBULATORY_CARE_PROVIDER_SITE_OTHER): Payer: Medicare Other

## 2020-09-23 DIAGNOSIS — R001 Bradycardia, unspecified: Secondary | ICD-10-CM

## 2020-09-23 NOTE — Patient Instructions (Signed)
   After Your Pacemaker   Monitor your pacemaker site for redness, swelling, and drainage. Call the device clinic at [phone] if you experience these symptoms or fever/chills.  Your incision was closed with Dermabond:  You may shower 1 day after your defibrillator implant and wash your incision with soap and water. Avoid lotions, ointments, or perfumes over your incision until it is well-healed.  You may use a hot tub or a pool after your wound check appointment if the incision is completely closed.  Do not lift, push or pull greater than 10 pounds with the affected arm until 6 weeks after your procedure. There are no other restrictions in arm movement after your wound check appointment.  You may drive, unless driving has been restricted by your healthcare providers.  Your Pacemaker is not MRI compatible.  Remote monitoring is used to monitor your pacemaker from home. This monitoring is scheduled every 91 days by our office. It allows us to keep an eye on the functioning of your device to ensure it is working properly. You will routinely see your Electrophysiologist annually (more often if necessary).  

## 2020-09-24 LAB — CUP PACEART INCLINIC DEVICE CHECK
Battery Remaining Longevity: 87 mo
Battery Voltage: 3.1 V
Brady Statistic RA Percent Paced: 87 %
Brady Statistic RV Percent Paced: 0.74 %
Date Time Interrogation Session: 20220922090400
Implantable Lead Implant Date: 20220912
Implantable Lead Implant Date: 20220912
Implantable Lead Location: 753859
Implantable Lead Location: 753860
Implantable Lead Model: 3830
Implantable Pulse Generator Implant Date: 20220912
Lead Channel Impedance Value: 400 Ohm
Lead Channel Impedance Value: 625 Ohm
Lead Channel Pacing Threshold Amplitude: 0.75 V
Lead Channel Pacing Threshold Amplitude: 1 V
Lead Channel Pacing Threshold Pulse Width: 0.4 ms
Lead Channel Pacing Threshold Pulse Width: 0.4 ms
Lead Channel Sensing Intrinsic Amplitude: 1.6 mV
Lead Channel Sensing Intrinsic Amplitude: 12 mV
Lead Channel Setting Pacing Amplitude: 3.5 V
Lead Channel Setting Pacing Amplitude: 3.5 V
Lead Channel Setting Pacing Pulse Width: 0.4 ms
Lead Channel Setting Sensing Sensitivity: 2 mV
Pulse Gen Model: 2272
Pulse Gen Serial Number: 3956585

## 2020-09-24 NOTE — Progress Notes (Signed)
Wound check appointment. Steri-strips removed. Wound without redness or edema. Incision edges approximated, wound well healed. Normal device function. Thresholds, sensing, and impedances consistent with implant measurements. Device programmed at 3.5V/auto capture programmed on for extra safety margin until 3 month visit. Histogram distribution appropriate for patient and level of activity. 23 AMS episodes and farfield noted, all occurring on 09/19/20. Appears to be 1:1 SVT with longest 14 seconds. Consulted industry for oversensing during [place]. Device reprogrammed from DDI to VVI for AT/AF detection. Patient educated about wound care, arm mobility, lifting restrictions. Patient enrolled in remote monitoring with next transmission scheduled 12/14/20. 91 day ROV follow up with Dr. Caryl Comes 12/14/20.

## 2020-09-25 ENCOUNTER — Telehealth: Payer: Self-pay | Admitting: Cardiology

## 2020-09-25 NOTE — Telephone Encounter (Signed)
Pt called, she had gone into A fib this AM around 6 a.  She took her po flecainide as previously instructed.   She wanted to be sure if of to take her short acting dilt like she has in the past for PAF..  reassured it was ok.  If HR continues to be elevated she was encouraged to go to ER possible DCCV.  She has not missed Xarelto since resumed after PPM.    Triage:  please check with pt on Monday to see if she is in SR and if not will need appt with Dr. Caryl Comes or EP APP

## 2020-09-27 NOTE — Telephone Encounter (Signed)
Outreach made to Pt.  Pt states she was in afib about 12 hours, but after a couple doses of her PRN diltiazem she converted.  No further action needed.

## 2020-10-12 ENCOUNTER — Other Ambulatory Visit (HOSPITAL_BASED_OUTPATIENT_CLINIC_OR_DEPARTMENT_OTHER): Payer: Self-pay | Admitting: Physician Assistant

## 2020-10-12 ENCOUNTER — Other Ambulatory Visit (HOSPITAL_COMMUNITY): Payer: Self-pay | Admitting: Physician Assistant

## 2020-10-12 DIAGNOSIS — K3189 Other diseases of stomach and duodenum: Secondary | ICD-10-CM | POA: Diagnosis not present

## 2020-10-12 DIAGNOSIS — K580 Irritable bowel syndrome with diarrhea: Secondary | ICD-10-CM | POA: Diagnosis not present

## 2020-10-12 DIAGNOSIS — D509 Iron deficiency anemia, unspecified: Secondary | ICD-10-CM | POA: Diagnosis not present

## 2020-10-12 DIAGNOSIS — R1115 Cyclical vomiting syndrome unrelated to migraine: Secondary | ICD-10-CM

## 2020-10-12 DIAGNOSIS — G43A0 Cyclical vomiting, not intractable: Secondary | ICD-10-CM | POA: Diagnosis not present

## 2020-10-12 DIAGNOSIS — R1012 Left upper quadrant pain: Secondary | ICD-10-CM | POA: Diagnosis not present

## 2020-10-18 DIAGNOSIS — D509 Iron deficiency anemia, unspecified: Secondary | ICD-10-CM | POA: Diagnosis not present

## 2020-10-27 DIAGNOSIS — H02889 Meibomian gland dysfunction of unspecified eye, unspecified eyelid: Secondary | ICD-10-CM | POA: Diagnosis not present

## 2020-10-27 DIAGNOSIS — H04123 Dry eye syndrome of bilateral lacrimal glands: Secondary | ICD-10-CM | POA: Diagnosis not present

## 2020-11-02 HISTORY — PX: CARPAL TUNNEL RELEASE: SHX101

## 2020-11-03 ENCOUNTER — Other Ambulatory Visit: Payer: Self-pay

## 2020-11-03 ENCOUNTER — Telehealth: Payer: Self-pay | Admitting: Internal Medicine

## 2020-11-03 ENCOUNTER — Ambulatory Visit (HOSPITAL_COMMUNITY)
Admission: RE | Admit: 2020-11-03 | Discharge: 2020-11-03 | Disposition: A | Discharge status: Home / Self Care | Payer: Medicare Other | Source: Ambulatory Visit | Attending: Physician Assistant | Admitting: Physician Assistant

## 2020-11-03 DIAGNOSIS — R197 Diarrhea, unspecified: Secondary | ICD-10-CM | POA: Diagnosis not present

## 2020-11-03 DIAGNOSIS — R1115 Cyclical vomiting syndrome unrelated to migraine: Secondary | ICD-10-CM | POA: Diagnosis not present

## 2020-11-03 DIAGNOSIS — R112 Nausea with vomiting, unspecified: Secondary | ICD-10-CM | POA: Diagnosis not present

## 2020-11-03 MED ORDER — TECHNETIUM TC 99M SULFUR COLLOID
1.9000 | Freq: Once | INTRAVENOUS | Status: AC | PRN
  Administered 2020-11-03: 1.9 via INTRAVENOUS

## 2020-11-03 MED ORDER — RIVAROXABAN 20 MG PO TABS
20.0000 mg | ORAL_TABLET | Freq: Every day | ORAL | 3 refills | Status: DC
Start: 2020-11-03 — End: 2021-01-31

## 2020-11-03 NOTE — Telephone Encounter (Deleted)
Prescription refill request for Xarelto received.  Indication:  Last office visit:08/12/20 Caryl Comes)  Weight: Age: Scr: CrCl:

## 2020-11-03 NOTE — Telephone Encounter (Signed)
Patient called back to let us know that she was able to get an appointment at her old urologist office. She no longer needs a urine test.

## 2020-11-03 NOTE — Telephone Encounter (Signed)
Patient states she took an UTI stick test and she tested positive on top on having all the symptoms for it. Due to no available appointments patient would like to know if she could come in and give an urine culture instead. She tried contacting her urologist but he left his practice so she would have to start the process as a new patient and would not be seen any sooner. She would like a call back to know what to do. Please advice.

## 2020-11-03 NOTE — Telephone Encounter (Signed)
Prescription refill request for Xarelto received.  Indication: Afib  Last office visit:08/12/20 Caryl Comes)  Weight: 107kg Age: 75 Scr: 0.71 CrCl: 120.56ml/min  Appropriate dose and refill sent to requested pharmacy.

## 2020-11-04 ENCOUNTER — Ambulatory Visit: Payer: Medicare Other | Admitting: Internal Medicine

## 2020-11-04 DIAGNOSIS — G5602 Carpal tunnel syndrome, left upper limb: Secondary | ICD-10-CM | POA: Diagnosis not present

## 2020-11-05 ENCOUNTER — Ambulatory Visit (INDEPENDENT_AMBULATORY_CARE_PROVIDER_SITE_OTHER): Payer: Medicare Other | Admitting: Family

## 2020-11-05 ENCOUNTER — Other Ambulatory Visit: Payer: Self-pay

## 2020-11-05 ENCOUNTER — Telehealth: Payer: Self-pay | Admitting: *Deleted

## 2020-11-05 ENCOUNTER — Encounter: Payer: Self-pay | Admitting: Family

## 2020-11-05 VITALS — BP 156/60 | HR 60 | Temp 97.9°F | Ht 67.0 in | Wt 226.0 lb

## 2020-11-05 DIAGNOSIS — R300 Dysuria: Secondary | ICD-10-CM

## 2020-11-05 DIAGNOSIS — Z87440 Personal history of urinary (tract) infections: Secondary | ICD-10-CM

## 2020-11-05 MED ORDER — NITROFURANTOIN MONOHYD MACRO 100 MG PO CAPS: 100.0000 mg | ORAL_CAPSULE | Freq: Two times a day (BID) | ORAL | 0 refills | Status: DC

## 2020-11-05 NOTE — Telephone Encounter (Signed)
   Pre-operative Risk Assessment    Patient Name: Veronica Bradford  DOB: Oct 25, 1945 MRN: [id number]      Request for Surgical Clearance   Procedure:   LEFT CARPAL TUNNEL RELEASE  Date of Surgery: Clearance TBD                                 Surgeon:  DR. Roseanne Kaufman Surgeon's Group or Practice Name:  [facility] Phone number:  [phone] Fax number:  [phone] ATTN: Orson Slick   Type of Clearance Requested: - Medical  - Pharmacy:  Hold Rivaroxaban (Xarelto) 2 DAYS PRIOR, DAY OF AND HOLD 1 DAY AFTER PROCEDURE   Type of Anesthesia:   STRAIGHT Local    Additional requests/questions:   Jiles Prows   11/05/2020, 4:52 PM

## 2020-11-05 NOTE — Progress Notes (Signed)
Veronica Bradford is a 75 y.o. female with the following history as recorded in EpicCare:  Patient Active Problem List   Diagnosis Date Noted   RBBB 08/09/2020   PVC's (premature ventricular contractions) 08/09/2020   Sinus bradycardia 08/09/2020   Nausea & vomiting 03/09/2020   Carpal tunnel syndrome, right 07/08/2019   Vitamin D deficiency 01/20/2019   Retinal edema 11/18/2018   Macular pucker, right eye 11/18/2018   Multiple pulmonary nodules determined by computed tomography of lung 12/06/2017   Paroxysmal atrial fibrillation ([place]) 09/04/2017   Osteopenia 05/23/2017   Annual physical exam 04/14/2015   PCP NOTES >>>>> 12/23/2014   IBS (irritable bowel syndrome) 08/13/2014   Hyperglycemia    Chronic sinusitis    Endometrial cancer ([place])    Back pain-- on tylenol #3 prn    GERD (gastroesophageal reflux disease)    HTN (hypertension) 27/25/3664   Diastolic dysfunction-grade 2 09/09/2013   Chronic anticoagulation 09/09/2013   Edema of both legs 09/09/2013   Dyslipidemia 09/09/2013   Obesity (BMI 30-39.9) 09/09/2013   Sleep apnea-- Dr Maxwell Caul, on CPAP 09/09/2013    Current Outpatient Medications  Medication Sig Dispense Refill   acetaminophen (TYLENOL) 500 MG tablet Take 500 mg by mouth every 6 (six) hours as needed for moderate pain or headache.     acetaminophen-codeine (TYLENOL #3) 300-30 MG tablet Take 1 tablet by mouth daily as needed for moderate pain. 30 tablet 0   ARTIFICIAL TEAR SOLUTION OP Place 1 drop into both eyes 4 (four) times daily.     cholecalciferol (VITAMIN D3) 25 MCG (1000 UNIT) tablet Take 1,000 Units by mouth daily.     Coenzyme Q10 300 MG CAPS Take 300 mg by mouth daily.     diltiazem (CARDIZEM CD) 120 MG 24 hr capsule Take 1 capsule (120 mg total) by mouth daily. 30 capsule 6   diltiazem (CARDIZEM) 30 MG tablet Take 1 tablet every 4 hours AS NEEDED for heart rate >100 as long as blood pressure >100. 45 tablet 5   Fish Oil-Cholecalciferol (OMEGA-3 + D PO)  Take 3 capsules by mouth daily.     flecainide (TAMBOCOR) 50 MG tablet Take 1 tablet (50 mg total) by mouth 2 (two) times daily. (Patient taking differently: Take 50-100 mg by mouth See admin instructions. Take 50 mg twice daily, may take an additional 100 mg dose as needed for breakthrough afib) 180 tablet 3   fluticasone (FLONASE) 50 MCG/ACT nasal spray Place 1 spray into both nostrils every other day.     furosemide (LASIX) 20 MG tablet Take 1 tablet (20 mg total) by mouth daily as needed for fluid. (Patient taking differently: Take 20-40 mg by mouth daily as needed for fluid.) 90 tablet 1   loperamide (IMODIUM A-D) 2 MG tablet Take 2-4 mg by mouth 2 (two) times daily as needed for diarrhea or loose stools.     nitrofurantoin, macrocrystal-monohydrate, (MACROBID) 100 MG capsule Take 1 capsule (100 mg total) by mouth 2 (two) times daily. 14 capsule 0   pantoprazole (PROTONIX) 40 MG tablet Take 1 tablet (40 mg total) by mouth daily. 30 tablet 3   pravastatin (PRAVACHOL) 40 MG tablet Take 1 tablet (40 mg total) by mouth at bedtime. 90 tablet 1   pyridOXINE (VITAMIN B-6) 100 MG tablet Take 100 mg by mouth daily.     rivaroxaban (XARELTO) 20 MG TABS tablet Take 1 tablet (20 mg total) by mouth daily with supper. 90 tablet 3   sucralfate (CARAFATE) 1  g tablet Take 1 g by mouth 4 (four) times daily -  with meals and at bedtime.     triamterene-hydrochlorothiazide (MAXZIDE-25) 37.5-25 MG tablet Take 0.5 tablets by mouth daily. 45 tablet 1   Misc Natural Products (CYSTEX URINARY HEALTH PO) Take 1 capsule by mouth daily. (Patient not taking: Reported on 11/05/2020)     No current facility-administered medications for this visit.    Allergies: Lactose intolerance (gi), Lisinopril, Nsaids, Tape, Tolmetin, Zoster vaccine live, Amoxicillin, and Penicillins  Past Medical History:  Diagnosis Date   Atrial fibrillation ([place])    Back pain    Chronic sinusitis    s/p 2 surgeries remotely, [name]   Closed  fracture of metatarsal of left foot    L foot, fifth metatarsal   Ectopic pregnancy    Endometrial cancer ([place]) 2004   Early diagnosis   GERD (gastroesophageal reflux disease)    Hyperlipemia    Hypertension    IBS (irritable bowel syndrome) 08/13/2014   Dx 2015, diarrhea on-off, Dr [name]    Lumbar radiculopathy 08/2012   MRI in 08/2012   Mild hyperparathyroidism ([place])    Osteopenia    PAF (paroxysmal atrial fibrillation) ([facility])    Prediabetes    Sciatica of right side 08/2013   Received Depomedrol 80 mg injection   Sleep apnea 09/09/2013   Dx with OSA in 2015, by Dr. Caryl Comes, APAP    Past Surgical History:  Procedure Laterality Date   ABDOMINAL HYSTERECTOMY     ATRIAL FIBRILLATION ABLATION N/A 09/04/2017   Procedure: [place];  Surgeon: [name], MD;  Location: [facility];  Service: Cardiovascular;  Laterality: N/A;   BIOPSY  03/11/2020   Procedure: BIOPSY;  Surgeon: [name], MD;  [facility]: [facility];  Service: Endoscopy;;  EGD and COLON   CARPAL TUNNEL RELEASE Right 06/17/2019   CHOLECYSTECTOMY  ~2006   COLONOSCOPY WITH PROPOFOL N/A 03/11/2020   Procedure: COLONOSCOPY WITH PROPOFOL;  Surgeon: [name], MD;  [facility];  Service: Endoscopy;  Laterality: N/A;   DILATION AND CURETTAGE OF UTERUS     after SAB   ESOPHAGOGASTRODUODENOSCOPY  02/09/2014   Dr. 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/2020   Procedure: PACEMAKER IMPLANT;  Surgeon: [name], MD;  Location: [facility];  Service: Cardiovascular;  Laterality: N/A;   TONSILLECTOMY     TOTAL ABDOMINAL HYSTERECTOMY W/ BILATERAL SALPINGOOPHORECTOMY     TOTAL KNEE  ARTHROPLASTY Right ~2008    Family History  Problem Relation Age of Onset   Hypertension Mother    Colon cancer Mother    Breast cancer Mother    Ovarian cancer Mother    Hypertension Father    Diabetes Father    Head & neck cancer Sister    CAD Neg Hx     Social History   Tobacco Use   Smoking status: Former    Types: Cigarettes    Quit date: 01/06/1980    Years since quitting: 40.8   Smokeless tobacco: Never   Tobacco comments:    smoked from [place] to 1982, less than 1 ppd  Substance Use Topics   Alcohol use: Yes  Comment: rare    Subjective:  Concerned for possible UTI- is prone to recurrent UTIs; did home test almost 2 weeks ago and was "positive" for UTI; had left over prescription of Bactrim DS and treated with 5 days; initially felt better on the antibiotic but felt like symptoms are still persisting; requesting urine culture;  Also requesting referral to her urologist who has changed practices and is now at [facility] in [place];    Objective:  Vitals:   11/05/20 1442  BP: (!) 156/60  Pulse: 60  Temp: 97.9 F (36.6 C)  TempSrc: Oral  SpO2: 99%  Weight: 226 lb (102.5 kg)  Height: 5\' 7"  (1.702 m)    General: Well developed, well nourished, in no acute distress  Skin : Warm and dry.  Head: Normocephalic and atraumatic  Lungs: Respirations unlabored; clear to auscultation bilaterally without wheeze, rales, rhonchi  CVS exam: normal rate and regular rhythm.  Neurologic: Alert and oriented; speech intact; face symmetrical; moves all extremities well; CNII-XII intact without focal deficit   Assessment:  1. History of recurrent UTIs   2. Dysuria     Plan:  Update urine culture today; Rx for Macrobid 100 mg bid x 7 days; follow up to be determined based on culture results;   This visit occurred during the SARS-CoV-2 public health emergency.  Safety protocols were in place, including screening questions prior to the visit, additional usage of staff PPE,  and extensive cleaning of exam room while observing appropriate contact time as indicated for disinfecting solutions.    No follow-ups on file.  Orders Placed This Encounter  Procedures   Urine Culture   Ambulatory referral to Urology    Referral Priority:   Routine    Referral Type:   Consultation    Referral Reason:   Specialty Services Required    Referred to Provider:   Primus Bravo., MD    Requested Specialty:   Urology    Number of Visits Requested:   1    Requested Prescriptions   Signed Prescriptions Disp Refills   nitrofurantoin, macrocrystal-monohydrate, (MACROBID) 100 MG capsule 14 capsule 0    Sig: Take 1 capsule (100 mg total) by mouth 2 (two) times daily.

## 2020-11-06 LAB — URINE CULTURE
MICRO NUMBER:: 12595832
Result:: NO GROWTH
SPECIMEN QUALITY:: ADEQUATE

## 2020-11-08 NOTE — Telephone Encounter (Signed)
Clinical pharmacist to review Xarelto prior to carpal tunnel surgery.

## 2020-11-09 ENCOUNTER — Other Ambulatory Visit: Payer: Self-pay | Admitting: Internal Medicine

## 2020-11-09 NOTE — Telephone Encounter (Signed)
Left a message for the patient to call back and speak to the on-call preop APP of the day 

## 2020-11-09 NOTE — Telephone Encounter (Signed)
Patient with diagnosis of atrial fibrillation on Xarelto for anticoagulation.    Procedure: left carpal tunnel release Date of procedure: TBD   CHA2DS2-VASc Score = 3   This indicates a 3.2% annual risk of stroke. The patient's score is based upon: CHF History: 0 HTN History: 1 Diabetes History: 0 Stroke History: 0 Vascular Disease History: 0 Age Score: 1 Gender Score: 1   CrCl 88 (with adjusted body weight) Platelet count 258  Per office protocol, patient can hold Xarelto for 2 days prior to procedure.   Patient will not need bridging with Lovenox (enoxaparin) around procedure.  Resume Xarelto after procedure at discretion of surgeon.

## 2020-11-17 ENCOUNTER — Encounter: Payer: Self-pay | Admitting: Internal Medicine

## 2020-11-17 ENCOUNTER — Other Ambulatory Visit (HOSPITAL_BASED_OUTPATIENT_CLINIC_OR_DEPARTMENT_OTHER): Payer: Self-pay | Admitting: Internal Medicine

## 2020-11-17 ENCOUNTER — Other Ambulatory Visit: Payer: Self-pay | Admitting: Internal Medicine

## 2020-11-17 DIAGNOSIS — Z1231 Encounter for screening mammogram for malignant neoplasm of breast: Secondary | ICD-10-CM

## 2020-11-17 NOTE — Telephone Encounter (Signed)
   Primary Cardiologist: Virl Axe, MD  Chart reviewed as part of pre-operative protocol coverage. Given past medical history and time since last visit, based on ACC/AHA guidelines, AADHYA BUSTAMANTE would be at acceptable risk for the planned procedure without further cardiovascular testing.   Patient with diagnosis of atrial fibrillation on Xarelto for anticoagulation.     Procedure: left carpal tunnel release Date of procedure: TBD     CHA2DS2-VASc Score = 3   This indicates a 3.2% annual risk of stroke. The patient's score is based upon: CHF History: 0 HTN History: 1 Diabetes History: 0 Stroke History: 0 Vascular Disease History: 0 Age Score: 1 Gender Score: 1    CrCl 88 (with adjusted body weight) Platelet count 258   Per office protocol, patient can hold Xarelto for 2 days prior to procedure.   Patient will not need bridging with Lovenox (enoxaparin) around procedure.  Resume Xarelto after procedure at discretion of surgeon.  Patient was advised that if she develops new symptoms prior to surgery to contact our office to arrange a follow-up appointment.  She verbalized understanding.  I will route this recommendation to the requesting party via Epic fax function and remove from pre-op pool.  Please call with questions.  Jossie Ng. Nekayla Heider NP-C    11/17/2020, 2:23 PM [address] Office [phone] Fax [phone]

## 2020-11-22 ENCOUNTER — Other Ambulatory Visit: Payer: Self-pay

## 2020-11-22 ENCOUNTER — Ambulatory Visit
Admission: RE | Admit: 2020-11-22 | Discharge: 2020-11-22 | Disposition: A | Discharge status: Home / Self Care | Payer: Medicare Other | Source: Ambulatory Visit

## 2020-11-22 DIAGNOSIS — Z1231 Encounter for screening mammogram for malignant neoplasm of breast: Secondary | ICD-10-CM

## 2020-11-23 DIAGNOSIS — G5602 Carpal tunnel syndrome, left upper limb: Secondary | ICD-10-CM | POA: Diagnosis not present

## 2020-11-26 ENCOUNTER — Telehealth: Payer: Self-pay | Admitting: Internal Medicine

## 2020-11-29 NOTE — Telephone Encounter (Signed)
PDMP okay, Rx sent 

## 2020-11-29 NOTE — Telephone Encounter (Signed)
Requesting: Tylenol #3 Contract: 12/29/2019 UDS: 12/29/2019 Last Visit: 05/28/2020 Next Visit: 11/30/2020 Last Refill: 07/20/2020 #30 and 0RF  Please Advise

## 2020-11-30 ENCOUNTER — Telehealth: Payer: Self-pay | Admitting: *Deleted

## 2020-11-30 ENCOUNTER — Encounter: Payer: Self-pay | Admitting: Internal Medicine

## 2020-11-30 ENCOUNTER — Ambulatory Visit (INDEPENDENT_AMBULATORY_CARE_PROVIDER_SITE_OTHER): Payer: Medicare Other | Admitting: Internal Medicine

## 2020-11-30 ENCOUNTER — Other Ambulatory Visit: Payer: Self-pay

## 2020-11-30 VITALS — BP 140/70 | HR 60 | Temp 97.8°F | Resp 18 | Ht 67.0 in | Wt 225.6 lb

## 2020-11-30 DIAGNOSIS — Z79899 Other long term (current) drug therapy: Secondary | ICD-10-CM

## 2020-11-30 DIAGNOSIS — D509 Iron deficiency anemia, unspecified: Secondary | ICD-10-CM | POA: Diagnosis not present

## 2020-11-30 DIAGNOSIS — I10 Essential (primary) hypertension: Secondary | ICD-10-CM | POA: Diagnosis not present

## 2020-11-30 DIAGNOSIS — E785 Hyperlipidemia, unspecified: Secondary | ICD-10-CM

## 2020-11-30 DIAGNOSIS — E041 Nontoxic single thyroid nodule: Secondary | ICD-10-CM | POA: Diagnosis not present

## 2020-11-30 NOTE — Assessment & Plan Note (Signed)
Diabetes: Doing well with diet, recheck A1c on RTC HTN: On Cardizem, Lasix as needed, Maxide.  BP today satisfactory, continue monitoring. Hyperlipidemia: On Pravachol, labs on RTC A. fib, a flutter: Had a pacemaker 09-2020, continue anticoagulated.  Seems to be doing well. Heart murmur?  Recheck on RTC Nausea, vomiting, diarrhea, anemia:  S/p Colonoscopy and EGD  03/11/2020  . EGD >> Erythematous mucosa of the antrum, biopsy done : no malignancy, no H. pylori. Since the last visit, had a gastric  emptying study: Rapid gastric emptying, dumping?. Iron was low according to the patient, on iron and increase dose of Protonix as Rx by GI Thyroid nodule:  Per [place] 06/07/2020: Inferior R thyroid nodule, BX Bethesda category 2. Parathyroid adenoma? We will recheck ultrasound 1 year versus referred to Endo Sebaceous cyst: See last visit, saw dermatology, cyst to be excised. Preventive care: Had a mammogram last week Had COVID-vaccine #5 and a flu shot. RTC 3-4 m

## 2020-11-30 NOTE — Patient Instructions (Addendum)
Check the  blood pressure 2 or 3 times a   week   BP GOAL is between 110/65 and  135/85. If it is consistently higher or lower, let me know   Go to the lab: provide a urine sample    GO TO THE FRONT DESK, [place] back for a physical in 3-4 months

## 2020-11-30 NOTE — Telephone Encounter (Signed)
Pt provided UDS after visit today and wanted PCP to know that her last dose of hydrocodone was 3-4 days ago.

## 2020-11-30 NOTE — Progress Notes (Signed)
Subjective:    Patient ID: Veronica Bradford, female    DOB: 1945-09-24, 75 y.o.   MRN: [id number]  DOS:  11/30/2020 Type of visit - description: f/u Since the last office visit she is doing well. She update me on all her chronic medical problems.  BP Readings from Last 3 Encounters:  11/30/20 140/70  11/05/20 (!) 156/60  09/13/20 (!) 134/44   Wt Readings from Last 3 Encounters:  11/30/20 225 lb 9.6 oz (102.3 kg)  11/05/20 226 lb (102.5 kg)  09/13/20 235 lb 14.3 oz (107 kg)     Review of Systems See above   Past Medical History:  Diagnosis Date   Atrial fibrillation ([facility])    Back pain    Chronic sinusitis    s/p 2 surgeries remotely, [name]   Closed fracture of metatarsal of left foot    L foot, fifth metatarsal   Ectopic pregnancy    Endometrial cancer ([place]) 2004   Early diagnosis   GERD (gastroesophageal reflux disease)    Hyperlipemia    Hypertension    IBS (irritable bowel syndrome) 08/13/2014   Dx 2015, diarrhea on-off, Dr [name]    Lumbar radiculopathy 08/2012   MRI in 08/2012   Mild hyperparathyroidism ([place])    Osteopenia    PAF (paroxysmal atrial fibrillation) (HCC)    Prediabetes    Sciatica of right side 08/2013   Received Depomedrol 80 mg injection   Sleep apnea 09/09/2013   Dx with OSA in 2015, by Dr. Caryl Comes, APAP    Past Surgical History:  Procedure Laterality Date   ABDOMINAL HYSTERECTOMY     ATRIAL FIBRILLATION ABLATION N/A 09/04/2017   Procedure: [place];  Surgeon: [name], MD;  Location: [facility];  Service: Cardiovascular;  Laterality: N/A;   BIOPSY  03/11/2020   Procedure: BIOPSY;  Surgeon: [name], MD;  [facility];  Service: Endoscopy;;  EGD and COLON   CARPAL TUNNEL RELEASE Right 06/17/2019   CARPAL TUNNEL RELEASE Left 11/2020   CHOLECYSTECTOMY  ~2006   COLONOSCOPY WITH PROPOFOL N/A 03/11/2020   Procedure: COLONOSCOPY WITH PROPOFOL;  Surgeon: [name], MD;  [facility];  Service: Endoscopy;  Laterality: N/A;   DILATION AND CURETTAGE OF UTERUS     after SAB   ESOPHAGOGASTRODUODENOSCOPY  02/09/2014   Dr. 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/2020   Procedure: PACEMAKER IMPLANT;  Surgeon: [name], MD;  Location: [facility];  Service: Cardiovascular;  Laterality: N/A;   TONSILLECTOMY     TOTAL ABDOMINAL HYSTERECTOMY W/ BILATERAL SALPINGOOPHORECTOMY     TOTAL KNEE ARTHROPLASTY Right ~2008    Allergies as of 11/30/2020       Reactions   Lactose Intolerance (gi) Diarrhea, Nausea And Vomiting   Lisinopril Cough   Nsaids Other (See Comments)   Pt on Xarelto   Tape    Causes blisters   Tolmetin Other (See Comments)   Pt on Xarelto   Zoster Vaccine Live Other (See Comments)   Serum sickness, high fever, neck pain    Amoxicillin Rash   Penicillins Rash   Local reaction  Medication List        Accurate as of November 30, 2020  5:11 PM. If you have any questions, ask your nurse or doctor.          STOP taking these medications    loperamide 2 MG tablet Commonly known as: IMODIUM A-D Stopped by: Kathlene November, MD   nitrofurantoin (macrocrystal-monohydrate) 100 MG capsule Commonly known as: Macrobid Stopped by: Kathlene November, MD       TAKE these medications    acetaminophen 500 MG tablet Commonly known as: TYLENOL Take 500 mg by mouth every 6 (six) hours as needed for moderate pain or headache.   acetaminophen-codeine 300-30 MG tablet Commonly known as: TYLENOL #3 TAKE 1 TABLET BY MOUTH DAILY AS NEEDED FOR MODERATE PAIN   ARTIFICIAL TEAR SOLUTION OP Place 1 drop into both eyes 4 (four) times daily.    cholecalciferol 25 MCG (1000 UNIT) tablet Commonly known as: VITAMIN D3 Take 1,000 Units by mouth daily.   Coenzyme Q10 300 MG Caps Take 300 mg by mouth daily.   CYSTEX URINARY HEALTH PO Take 1 capsule by mouth daily.   diltiazem 120 MG 24 hr capsule Commonly known as: Cardizem CD Take 1 capsule (120 mg total) by mouth daily.   diltiazem 30 MG tablet Commonly known as: Cardizem Take 1 tablet every 4 hours AS NEEDED for heart rate >100 as long as blood pressure >100.   flecainide 50 MG tablet Commonly known as: TAMBOCOR Take 1 tablet (50 mg total) by mouth 2 (two) times daily. What changed:  how much to take when to take this additional instructions   fluticasone 50 MCG/ACT nasal spray Commonly known as: FLONASE Place 1 spray into both nostrils every other day.   furosemide 20 MG tablet Commonly known as: LASIX Take 1 tablet (20 mg total) by mouth daily as needed for fluid. What changed: how much to take   OMEGA-3 + D PO Take 3 capsules by mouth daily.   pantoprazole 40 MG tablet Commonly known as: PROTONIX Take 1 tablet (40 mg total) by mouth daily. What changed: when to take this   pravastatin 40 MG tablet Commonly known as: PRAVACHOL TAKE 1 TABLET(40 MG) BY MOUTH AT BEDTIME   pyridOXINE 100 MG tablet Commonly known as: VITAMIN B-6 Take 100 mg by mouth daily.   rivaroxaban 20 MG Tabs tablet Commonly known as: Xarelto Take 1 tablet (20 mg total) by mouth daily with supper.   sucralfate 1 g tablet Commonly known as: CARAFATE Take 1 g by mouth 4 (four) times daily -  with meals and at bedtime.   triamterene-hydrochlorothiazide 37.5-25 MG tablet Commonly known as: MAXZIDE-25 Take 0.5 tablets by mouth daily.           Objective:   Physical Exam BP 140/70 (BP Location: Right Arm, Patient Position: Sitting, Cuff Size: Large)   Pulse 60   Temp 97.8 F (36.6 C) (Oral)   Resp 18   Ht 5\' 7"  (1.702 m)   Wt 225 lb 9.6 oz (102.3 kg)   SpO2 99%   BMI  35.33 kg/m     General:   Well developed, NAD, BMI noted. HEENT:  Normocephalic . Face symmetric, atraumatic Lungs:  CTA B Normal respiratory effort, no intercostal retractions, no accessory muscle use. Heart: Regular, trace systolic murmur Lower extremities: no pretibial edema bilaterally  Skin: Not pale. Not jaundice Neurologic:  alert & oriented X3.  Speech normal, gait appropriate for age and unassisted Psych--  Cognition  and judgment appear intact.  Cooperative with normal attention span and concentration.  Behavior appropriate. No anxious or depressed appearing.   Assessment    Assessment: Prediabetes HTN Hyperlipidemia Chronic lower extremity edema L>R (eval by previous pcp, related to varicose veins?) CV:  -- P. Atrial fibrillation, A. flutter: on xarelto flecainide-diltiazem  prn --Ablation 09/2017 --Pacemaker 09-2020 GI: Dr [name]  --GERD (zantac or protonix prn), IBS, chronic diarrhea --GI records: [place], 2004, 2009 8 and 07-2013. Negative for polyps or IBD. + Diverticuli. EGD/cscope 11/2016; EGD/Cscope 03/11/2020   --Increased LFTs: CT abdomen 05/2016: Normal liver. 05/2017: WNL  Hepatitis B and C, Alpha 1 antitrypsin, anti-smooth muscle antibody, ANA trans-ferritin  OSA--- CPAP, Dr. Maxwell Caul Osteopenia:  Multiple  Dexas, last two:  T score 2009  -1.1; T score 2015  -0.7; T score 05/2017 -1.8 DJD --Chronic back pain-- tylenol #3 prn --- RF per PCP --history of epidurals in the past, MRI 2014: Scoliosis,DJD --CIPRO pre dental d/t knee replacement (rx by ortho) H/o endometrial cancer, found during a hysterectomy, no chemotherapy or XRT.  Primary hyperparathyroidism: Chronic hypercalcemia, increased PTH 05/2016, refered to ENT  PLAN:  Diabetes: Doing well with diet, recheck A1c on RTC HTN: On Cardizem, Lasix as needed, Maxide.  BP today satisfactory, continue monitoring. Hyperlipidemia: On Pravachol, labs on RTC A. fib, a flutter: Had a pacemaker  09-2020, continue anticoagulated.  Seems to be doing well. Heart murmur?  Recheck on RTC Nausea, vomiting, diarrhea, anemia:  S/p Colonoscopy and EGD  03/11/2020  . EGD >> Erythematous mucosa of the antrum, biopsy done : no malignancy, no H. pylori. Since the last visit, had a gastric  emptying study: Rapid gastric emptying, dumping?. Iron was low according to the patient, on iron and increase dose of Protonix as Rx by GI Thyroid nodule:  Per [place] 06/07/2020: Inferior R thyroid nodule, BX [facility] category 2. Parathyroid adenoma? We will recheck ultrasound 1 year versus referred to Endo Sebaceous cyst: See last visit, saw dermatology, cyst to be excised. Preventive care: Had a mammogram last week Had COVID-vaccine #5 and a flu shot. RTC 3-4 m  This visit occurred during the SARS-CoV-2 public health emergency.  Safety protocols were in place, including screening questions prior to the visit, additional usage of staff PPE, and extensive cleaning of exam room while observing appropriate contact time as indicated for disinfecting solutions.

## 2020-11-30 NOTE — Telephone Encounter (Signed)
Noted, thank you

## 2020-12-01 LAB — DRUG MONITORING, PANEL 8 WITH CONFIRMATION, URINE
6 Acetylmorphine: NEGATIVE ng/mL (ref <10)
Alcohol Metabolites: NEGATIVE ng/mL (ref <500)
Amphetamines: NEGATIVE ng/mL (ref <500)
Benzodiazepines: NEGATIVE ng/mL (ref <100)
Buprenorphine, Urine: NEGATIVE ng/mL (ref <5)
Cocaine Metabolite: NEGATIVE ng/mL (ref <150)
Creatinine: 3.5 mg/dL — ABNORMAL LOW (ref >=20.0)
MDMA: NEGATIVE ng/mL (ref <500)
Marijuana Metabolite: NEGATIVE ng/mL (ref <20)
Opiates: NEGATIVE ng/mL (ref <100)
Oxidant: NEGATIVE ug/mL (ref <200)
Oxycodone: NEGATIVE ng/mL (ref <100)
Specific Gravity: 1.003 (ref >=1.003)
pH: 7.1 (ref 4.5–9.0)

## 2020-12-01 LAB — DM TEMPLATE

## 2020-12-07 ENCOUNTER — Ambulatory Visit: Payer: Medicare Other | Admitting: Dermatology

## 2020-12-07 DIAGNOSIS — M25642 Stiffness of left hand, not elsewhere classified: Secondary | ICD-10-CM | POA: Diagnosis not present

## 2020-12-13 ENCOUNTER — Telehealth: Payer: Self-pay

## 2020-12-13 NOTE — Telephone Encounter (Signed)
The patient called with questions about her appointments. I answered those questions. She states that sometimes she can feel her heart rate in her throat. She also felt like she can feel extra beats.

## 2020-12-13 NOTE — Telephone Encounter (Signed)
Patient called to report she went into AF this am. Palpitations awoke patient at approx 7 am. Patient states her HR upon wakening aw 110. She has taken an additional 60 mg of cardizem (30 mg at a time) and an additional 100 mg of flecainide per verbal order of Dr. Caryl Comes at patient last appointment. Rate controlled at this time with intermittent   V pacing. Patient has been active with AF clinic in past. Patient encouraged to call however states she wishes to wait to see if she converts on her own within the next 24 to 48 hours. She will call device clinic or AF clinic for additional recommendations if she doesn't not convert on her own.

## 2020-12-14 ENCOUNTER — Ambulatory Visit (INDEPENDENT_AMBULATORY_CARE_PROVIDER_SITE_OTHER): Payer: Medicare Other | Admitting: Internal Medicine

## 2020-12-14 ENCOUNTER — Encounter: Payer: Self-pay | Admitting: Internal Medicine

## 2020-12-14 ENCOUNTER — Ambulatory Visit (INDEPENDENT_AMBULATORY_CARE_PROVIDER_SITE_OTHER): Payer: Medicare Other

## 2020-12-14 ENCOUNTER — Other Ambulatory Visit: Payer: Self-pay

## 2020-12-14 VITALS — BP 130/70 | HR 82 | Ht 67.0 in | Wt 221.0 lb

## 2020-12-14 DIAGNOSIS — I495 Sick sinus syndrome: Secondary | ICD-10-CM

## 2020-12-14 DIAGNOSIS — Z01812 Encounter for preprocedural laboratory examination: Secondary | ICD-10-CM

## 2020-12-14 DIAGNOSIS — I493 Ventricular premature depolarization: Secondary | ICD-10-CM

## 2020-12-14 DIAGNOSIS — I480 Paroxysmal atrial fibrillation: Secondary | ICD-10-CM | POA: Diagnosis not present

## 2020-12-14 DIAGNOSIS — Z01818 Encounter for other preprocedural examination: Secondary | ICD-10-CM | POA: Diagnosis not present

## 2020-12-14 DIAGNOSIS — Z950 Presence of cardiac pacemaker: Secondary | ICD-10-CM | POA: Insufficient documentation

## 2020-12-14 DIAGNOSIS — D509 Iron deficiency anemia, unspecified: Secondary | ICD-10-CM | POA: Diagnosis not present

## 2020-12-14 DIAGNOSIS — R001 Bradycardia, unspecified: Secondary | ICD-10-CM | POA: Diagnosis not present

## 2020-12-14 LAB — CUP PACEART REMOTE DEVICE CHECK
Battery Remaining Longevity: 79 mo
Battery Remaining Percentage: 95.5 %
Battery Voltage: 3.01 V
Brady Statistic AP VP Percent: 2.6 %
Brady Statistic AP VS Percent: 89 %
Brady Statistic AS VP Percent: 1 %
Brady Statistic AS VS Percent: 8.4 %
Brady Statistic RA Percent Paced: 90 %
Brady Statistic RV Percent Paced: 2.7 %
Date Time Interrogation Session: 20221212112553
Implantable Lead Implant Date: 20220912
Implantable Lead Implant Date: 20220912
Implantable Lead Location: 753859
Implantable Lead Location: 753860
Implantable Lead Model: 3830
Implantable Pulse Generator Implant Date: 20220912
Lead Channel Impedance Value: 450 Ohm
Lead Channel Impedance Value: 600 Ohm
Lead Channel Pacing Threshold Amplitude: 0.75 V
Lead Channel Pacing Threshold Amplitude: 1 V
Lead Channel Pacing Threshold Pulse Width: 0.4 ms
Lead Channel Pacing Threshold Pulse Width: 0.4 ms
Lead Channel Sensing Intrinsic Amplitude: 0.4 mV
Lead Channel Sensing Intrinsic Amplitude: 12 mV
Lead Channel Setting Pacing Amplitude: 3.5 V
Lead Channel Setting Pacing Amplitude: 3.5 V
Lead Channel Setting Pacing Pulse Width: 0.4 ms
Lead Channel Setting Sensing Sensitivity: 2 mV
Pulse Gen Model: 2272
Pulse Gen Serial Number: 3956585

## 2020-12-14 LAB — IRON,TIBC AND FERRITIN PANEL: Ferritin: 9.3

## 2020-12-14 LAB — CBC AND DIFFERENTIAL
HCT: 39 (ref 36–46)
Hemoglobin: 13 (ref 12.0–16.0)
Platelets: 278 10*3/uL (ref 150–400)
WBC: 6.9

## 2020-12-14 LAB — CBC: RBC: 4.54 (ref 3.87–5.11)

## 2020-12-14 NOTE — Patient Instructions (Addendum)
Medication Instructions:  Your physician recommends that you continue on your current medications as directed. Please refer to the Current Medication list given to you today.  *If you need a refill on your cardiac medications before your next appointment, please call your pharmacy*   Lab Work: CBC and BMET today  If you have labs (blood work) drawn today and your tests are completely normal, you will receive your results only by: [place] (if you have MyChart) OR A paper copy in the mail If you have any lab test that is abnormal or we need to change your treatment, we will call you to review the results.   Testing/Procedures: Your physician has recommended that you have a Cardioversion (DCCV). Electrical Cardioversion uses a jolt of electricity to your heart either through paddles or wired patches attached to your chest. This is a controlled, usually prescheduled, procedure. Defibrillation is done under light anesthesia in the hospital, and you usually go home the day of the procedure. This is done to get your heart back into a normal rhythm. You are not awake for the procedure. Please see the instruction sheet given to you today.    Follow-Up: At [facility], you and your health needs are our priority.  As part of our continuing mission to provide you with exceptional heart care, we have created designated Provider Care Teams.  These Care Teams include your primary Cardiologist (physician) and Advanced Practice Providers (APPs -  Physician Assistants and Nurse Practitioners) who all work together to provide you with the care you need, when you need it.  We recommend signing up for the patient portal called "MyChart".  Sign up information is provided on this After Visit Summary.  MyChart is used to connect with patients for Virtual Visits (Telemedicine).  Patients are able to view lab/test results, encounter notes, upcoming appointments, etc.  Non-urgent messages can be sent to your  provider as well.   To learn more about what you can do with MyChart, go to [email or web address].    Your next appointment:   Follow up to be scheduled:1}    Other Instructions  You are scheduled for a Cardioversion on 12/20/2020 with Dr. Fletcher Anon.  Please arrive at [facility] at 630am.  DIET: Nothing to eat or drink after midnight except a sip of water with medications (see medication instructions below)  FYI: For your safety, and to allow [place] to monitor your vital signs accurately during the surgery/procedure we request that   if you have artificial nails, gel coating, SNS etc. Please have those removed prior to your surgery/procedure. Not having the nail coverings /polish removed may result in cancellation or delay of your surgery/procedure.   Medication Instructions: Hold Furosemide the morning of your procedure  Continue your anticoagulant:Xarelto You will need to continue your anticoagulant after your procedure until you  are told by your Provider that it is safe to stop   Labs: Labs will be drawn today  You must have a responsible person to drive you home and stay in the waiting area during your procedure. Failure to do so could result in cancellation.  Bring your insurance cards.  *Special Note: Every effort is made to have your procedure done on time. Occasionally there are emergencies that occur at the hospital that may cause delays. Please be patient if a delay does occur.

## 2020-12-14 NOTE — Progress Notes (Signed)
Patient ID: Veronica Bradford, female   DOB: 1945-09-10, 75 y.o.   MRN: [id number]      Patient Care Team: Colon Branch, MD as PCP - General (Internal Medicine) [name], MD as PCP - Cardiology (Cardiology) Marius Ditch, MD as Consulting Physician (Internal Medicine) [name], MD as Consulting Physician (Cardiology) Wylene Simmer, MD as Consulting Physician (Orthopedic Surgery) Armandina Gemma, MD as Consulting Physician (General Surgery) [name], MD as Consulting Physician (Gastroenterology) [place], [place], [place] as Consulting Physician (Optometry) Sherlynn Stalls, MD as Consulting Physician (Ophthalmology) Roseanne Kaufman, MD as Consulting Physician (Orthopedic Surgery)   HPI  Veronica Bradford is a 75 y.o. 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  She does not need to get cardioverted however.  Atrial fibrillation seems to be associated in some way with her IBS which is associated with diarrhea.  She has treated sleep apnea with good reports.   Anticoagulation with Rivaroxaban and no bleeding issues   More recently has had episodes of atrial fibrillation about once every month or 2 lasting 4 -9 hours, and went into Afib this am atrial fibrillation and is associated with significant weakness and exercise intolerance and freq increases of the HR into the 120-140s.        Date Cr Hgb  K Mag  5/18 0.66 14.7 3.7 2.1  12/21 0.59 13.3 3.9 2.2 6  4/22 0.58 13.7 3.6   9/22 0.71     11/22 3.5 10.2       DATE TEST EF   9/15 Myoview  65 % No ischemia  8/17 Echo   60-65 %   8/21 Myoview 71% No ischemia  8/21 Echo  60-65% LAE-mod-severe  (42.5 ml/m2)           Today, she is accompanied by her husband and appears well. She states that she  did not feel well when she experienced atrial fibrillation 30 hrs ago. She reports being lightheaded and short of breath recently after walking, and her heart rate is 107 bpm at this time.   After she received her pacemaker, she states that she has more energy compared to pre-op with no LH or SOB CP   chronic mild edema    She has remains compliant with Rivaroxaban   She also takes 2 pills of Lasix.      Past Medical History:  Diagnosis Date   Atrial fibrillation ([place])    Back pain    Chronic sinusitis    s/p 2 surgeries remotely, [name]   Closed fracture of metatarsal of left foot    L foot, fifth metatarsal   Ectopic pregnancy    Endometrial cancer ([place]) 2004   Early diagnosis   GERD (gastroesophageal reflux disease)    Hyperlipemia    Hypertension    IBS (irritable bowel syndrome) 08/13/2014   Dx 2015, diarrhea on-off, Dr [name]    Lumbar radiculopathy 08/2012   MRI in 08/2012   Mild hyperparathyroidism ([place])    Osteopenia    PAF (paroxysmal atrial fibrillation) ([place])    Prediabetes    Sciatica of right side 08/2013   Received Depomedrol 80 mg injection   Sleep apnea 09/09/2013   Dx with OSA in 2015, by Dr. Caryl Comes, APAP  Past Surgical History:  Procedure Laterality Date   ABDOMINAL HYSTERECTOMY     ATRIAL FIBRILLATION ABLATION N/A 09/04/2017   Procedure: ATRIAL FIBRILLATION ABLATION;  Surgeon: [name], MD;  Location: [place] [facility];  Service: Cardiovascular;  Laterality: N/A;   BIOPSY  03/11/2020   Procedure: BIOPSY;  Surgeon: [name], MD;  Location: [facility];  Service: Endoscopy;;  EGD and COLON   CARPAL TUNNEL RELEASE Right 06/17/2019   CARPAL TUNNEL RELEASE Left 11/2020   CHOLECYSTECTOMY  ~2006   COLONOSCOPY WITH PROPOFOL N/A 03/11/2020   Procedure: COLONOSCOPY WITH PROPOFOL;  Surgeon: [name], MD;  Location: [facility];  Service: Endoscopy;  Laterality: N/A;   DILATION AND CURETTAGE OF UTERUS     after SAB    ESOPHAGOGASTRODUODENOSCOPY  02/09/2014   Dr. 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/2020   Procedure: PACEMAKER IMPLANT;  Surgeon: [name], MD;  Location: [facility];  Service: Cardiovascular;  Laterality: N/A;   TONSILLECTOMY     TOTAL ABDOMINAL HYSTERECTOMY W/ BILATERAL SALPINGOOPHORECTOMY     TOTAL KNEE ARTHROPLASTY Right ~2008    Current Outpatient Medications  Medication Sig Dispense Refill   acetaminophen (TYLENOL) 500 MG tablet Take 500 mg by mouth every 6 (six) hours as needed for moderate pain or headache.     acetaminophen-codeine (TYLENOL #3) 300-30 MG tablet TAKE 1 TABLET BY MOUTH DAILY AS NEEDED FOR MODERATE PAIN 30 tablet 0   ARTIFICIAL TEAR SOLUTION OP Place 1 drop into both eyes 4 (four) times daily.     cholecalciferol (VITAMIN D3) 25 MCG (1000 UNIT) tablet Take 1,000 Units by mouth daily.     Coenzyme Q10 300 MG CAPS Take 300 mg by mouth daily.     diltiazem (CARDIZEM CD) 120 MG 24 hr capsule Take 1 capsule (120 mg total) by mouth daily. 30 capsule 6   diltiazem (CARDIZEM) 30 MG tablet Take 1 tablet every 4 hours AS NEEDED for heart rate >100 as long as blood pressure >100. 45 tablet 5   Fish Oil-Cholecalciferol (OMEGA-3 + D PO) Take 3 capsules by mouth daily.     flecainide (TAMBOCOR) 50 MG tablet Take 1 tablet (50 mg total) by mouth 2 (two) times daily. (Patient taking differently: Take 50-100 mg by mouth See admin instructions. Take 50 mg twice daily, may take an additional 100 mg dose as needed for breakthrough afib) 180 tablet 3   fluticasone (FLONASE) 50 MCG/ACT nasal spray Place 1 spray into both nostrils every other day.     furosemide (LASIX) 20 MG  tablet Take 1 tablet (20 mg total) by mouth daily as needed for fluid. (Patient taking differently: Take 20-40 mg by mouth daily as needed for fluid.) 90 tablet 1   Misc Natural Products (CYSTEX URINARY HEALTH PO) Take 1 capsule by mouth daily.     pantoprazole (PROTONIX) 40 MG tablet Take 1 tablet (40 mg total) by mouth daily. (Patient taking differently: Take 40 mg by mouth 2 (two) times daily.) 30 tablet 3   pravastatin (PRAVACHOL) 40 MG tablet TAKE 1 TABLET(40 MG) BY MOUTH AT BEDTIME 90 tablet 1   pyridOXINE (VITAMIN B-6) 100 MG tablet Take 100 mg  by mouth daily.     rivaroxaban (XARELTO) 20 MG TABS tablet Take 1 tablet (20 mg total) by mouth daily with supper. 90 tablet 3   sucralfate (CARAFATE) 1 g tablet Take 1 g by mouth 4 (four) times daily -  with meals and at bedtime.     triamterene-hydrochlorothiazide (MAXZIDE-25) 37.5-25 MG tablet Take 0.5 tablets by mouth daily. 45 tablet 1   No current facility-administered medications for this visit.    Allergies  Allergen Reactions   Lactose Intolerance (Gi) Diarrhea and Nausea And Vomiting   Lisinopril Cough   Nsaids Other (See Comments)    Pt on Xarelto   Tape     Causes blisters    Tolmetin Other (See Comments)    Pt on Xarelto   Zoster Vaccine Live Other (See Comments)    Serum sickness, high fever, neck pain    Amoxicillin Rash   Penicillins Rash    Local reaction    Review of Systems negative except from HPI and PMH (+) Shortness of Breath (+) Lightheadedness (+) LE Edema  Physical Exam: BP 130/70 (BP Location: Left Arm, Patient Position: Sitting, Cuff Size: Normal)    Pulse 82    Ht 5\' 7"  (1.702 m)    Wt 221 lb (100.2 kg)    SpO2 98%    BMI 34.61 kg/m  Device pocket well healed; without hematoma or erythema.  There is no tethering  Well developed and well nourished in no acute distress HENT normal Neck supple with JVP-flat Clear Device pocket well healed; without hematoma or erythema.  There is no tethering   Regular rate and rhythm, no  murmur Abd-soft with active BS No Clubbing cyanosis 1-2+ edema Skin-warm and dry A & Oriented  Grossly normal sensory and motor function  ECG aFlutter at 280 msec with variable conduction and intermittent pacing and paced QRS about 168msec    Assessment and  Plan  Atrial fibrillation-flutter persistent  CHADS-VASc score 2 (age/gender)    Obesity  RBBB/left axis deviation  PVCs  Sinus bradycardia/junctional rhythm  Pt has persistent atrial flutter in the wake of her prior atrial fibrillation ablation.  She had felt great until this point which occurred about 30 hours ago.  Efforts to pace terminate resulted in atrial fibrillation.  We will schedule for cardioversion but hopefully she will revert on her own.  For right now we will hold off on decisions regarding adjusting antiarrhythmic therapy I will continue her on her Xarelto at 20 mg daily,her flecainide at 50 mg twice daily and rate control with Cardizem 120.  She is volume overloaded.  This may be partly related to the atrial fibrillation we will continue her on her furosemide at 20 mg a day.  When she reverts, if it does not improve, we will have her increase it to 40 mg a day x3 days.  Her device was reprogrammed to maximize longevity and rate response was activated.  She is also has some palpitations that sound like recurrent PVCs.  Her PVC count was 4.3 thousand out of about 9 million  F/U in 3 Months/Year   I,Tinashe Williams,acting as a scribe for Virl Axe, MD.,have documented all relevant documentation on the behalf of Virl Axe, MD,as directed by  Virl Axe, MD while in the presence of Virl Axe, MD.   I, Virl Axe, MD, have reviewed all documentation for this visit. The documentation on 12/14/20 for the exam, diagnosis, procedures, and orders are all accurate and complete.

## 2020-12-15 ENCOUNTER — Telehealth: Payer: Self-pay

## 2020-12-15 ENCOUNTER — Encounter: Payer: Self-pay | Admitting: Internal Medicine

## 2020-12-15 LAB — BASIC METABOLIC PANEL
BUN/Creatinine Ratio: 12 (ref 12–28)
BUN: 10 mg/dL (ref 8–27)
CO2: 24 mmol/L (ref 20–29)
Calcium: 10.8 mg/dL — ABNORMAL HIGH (ref 8.7–10.3)
Chloride: 103 mmol/L (ref 96–106)
Creatinine, Ser: 0.82 mg/dL (ref 0.57–1.00)
Glucose: 114 mg/dL — ABNORMAL HIGH (ref 70–99)
Potassium: 3.9 mmol/L (ref 3.5–5.2)
Sodium: 140 mmol/L (ref 134–144)
eGFR: 76 mL/min/{1.73_m2} (ref >59)

## 2020-12-15 LAB — CBC
Hematocrit: 41.2 % (ref 34.0–46.6)
Hemoglobin: 13.4 g/dL (ref 11.1–15.9)
MCH: 28.8 pg (ref 26.6–33.0)
MCHC: 32.5 g/dL (ref 31.5–35.7)
MCV: 89 fL (ref 79–97)
Platelets: 304 10*3/uL (ref 150–450)
RBC: 4.65 x10E6/uL (ref 3.77–5.28)
RDW: 14.2 % (ref 11.7–15.4)
WBC: 8.9 10*3/uL (ref 3.4–10.8)

## 2020-12-15 NOTE — Telephone Encounter (Signed)
The patient called asking for the nurse. I told her the nurse will give her a call back at the [phone] number. The patient verbalized understanding.

## 2020-12-15 NOTE — Telephone Encounter (Signed)
Patient called in stating when she was seen by SK she states he made some changes to get her out of aflutter and she thinks something is wrong because she woke up with her heart racing. She is nervous something is wrong when she walks from the kitchen to the bedroom her heart rate spikes from 70 to 92 in the matter of seconds. Patient is sending in a transmission for a nurse to look at it and get back with her

## 2020-12-15 NOTE — Telephone Encounter (Signed)
Responded to patient via MyChart after reviewing transmission with Dr. Caryl Comes.

## 2020-12-15 NOTE — Telephone Encounter (Signed)
Pt is returning call to Amy in [facility]

## 2020-12-15 NOTE — Telephone Encounter (Signed)
Attempted to return patient call.  No answer.  LVM with device clinic # to return call.

## 2020-12-17 DIAGNOSIS — L7211 Pilar cyst: Secondary | ICD-10-CM | POA: Diagnosis not present

## 2020-12-17 DIAGNOSIS — L720 Epidermal cyst: Secondary | ICD-10-CM | POA: Diagnosis not present

## 2020-12-20 ENCOUNTER — Encounter: Admission: RE | Payer: Self-pay | Source: Home / Self Care

## 2020-12-20 ENCOUNTER — Ambulatory Visit: Admission: RE | Admit: 2020-12-20 | Payer: Medicare Other | Source: Home / Self Care | Admitting: Cardiovascular Disease

## 2020-12-20 SURGERY — CARDIOVERSION
Anesthesia: General

## 2020-12-23 DIAGNOSIS — R197 Diarrhea, unspecified: Secondary | ICD-10-CM | POA: Diagnosis not present

## 2020-12-23 DIAGNOSIS — Z862 Personal history of diseases of the blood and blood-forming organs and certain disorders involving the immune mechanism: Secondary | ICD-10-CM | POA: Diagnosis not present

## 2020-12-23 DIAGNOSIS — R140 Abdominal distension (gaseous): Secondary | ICD-10-CM | POA: Diagnosis not present

## 2020-12-23 DIAGNOSIS — R790 Abnormal level of blood mineral: Secondary | ICD-10-CM | POA: Diagnosis not present

## 2020-12-23 DIAGNOSIS — R112 Nausea with vomiting, unspecified: Secondary | ICD-10-CM | POA: Diagnosis not present

## 2020-12-24 NOTE — Progress Notes (Signed)
Remote pacemaker transmission.   

## 2020-12-28 ENCOUNTER — Ambulatory Visit (HOSPITAL_BASED_OUTPATIENT_CLINIC_OR_DEPARTMENT_OTHER): Payer: Medicare Other

## 2020-12-30 DIAGNOSIS — G4733 Obstructive sleep apnea (adult) (pediatric): Secondary | ICD-10-CM | POA: Diagnosis not present

## 2021-01-04 ENCOUNTER — Encounter: Payer: Self-pay | Admitting: Internal Medicine

## 2021-01-04 MED ORDER — FUROSEMIDE 40 MG PO TABS: 40.0000 mg | ORAL_TABLET | Freq: Every day | ORAL | 3 refills | Status: DC

## 2021-01-04 MED ORDER — FUROSEMIDE 20 MG PO TABS: 40.0000 mg | ORAL_TABLET | Freq: Every day | ORAL | 1 refills | Status: DC

## 2021-01-06 ENCOUNTER — Telehealth: Payer: Self-pay

## 2021-01-06 NOTE — Telephone Encounter (Signed)
Discussed reprogramming RR with Dr. Caryl Comes who is agreeable. Successful telephone encounter to patient to discuss. Patient is scheduled for device clinic appointment Friday, 01/06/21 at 10:00 am. Per Dr. Caryl Comes decrease slope to 8 and drop max sense rate by 10.

## 2021-01-06 NOTE — Telephone Encounter (Signed)
The patient states that she spoke with [facility] before about her rates being high. She states Portia told her she will get her on the schedule when Dr. Caryl Comes is in the office. I told her Truddie Crumble will talk with Dr. Caryl Comes and give her a call back.

## 2021-01-06 NOTE — Telephone Encounter (Signed)
See Patient message note dated 12/15/20 in EMR. Will discuss patients request to have Rate Response adjusted with Dr. Caryl Comes and call back with device clinic appointment.      "Veronica Bradford! I just reviewed your transmission with Dr. Caryl Comes. You are now in sinus rythem and your cardioversion will be cancelled. Rate response was turned on yesterday so your pacemaker is doing exactly what it is programmed to do which is increase your rate based on your need with activity. If this is too much of a rate for you (if you are feeling like your heart is racing) we can bring you in to lessen the rate response. But please know that Dr. Caryl Comes said everything looks fine.    Let me know what you want to do about reprogramming. If you want to give it some time we can and bring you in one day next week when Dr. Caryl Comes is here.    Thanks,  Sophiea Ueda"   Last read by [name] at 11:10 AM on 01/06/2021.

## 2021-01-07 ENCOUNTER — Ambulatory Visit (INDEPENDENT_AMBULATORY_CARE_PROVIDER_SITE_OTHER): Payer: Medicare Other

## 2021-01-07 ENCOUNTER — Other Ambulatory Visit: Payer: Self-pay

## 2021-01-07 DIAGNOSIS — R001 Bradycardia, unspecified: Secondary | ICD-10-CM | POA: Diagnosis not present

## 2021-01-07 NOTE — Progress Notes (Signed)
Pacemaker check in clinic to decrease slope and Max sensor rate per Dr. Caryl Comes. Slope decreased to 8 and Max sense from 120-110.  Normal device function. Thresholds, sensing, impedances consistent with previous measurements. Device programmed to maximize longevity. No mode switch or high ventricular rates noted. Device programmed at appropriate safety margins. Histogram distribution appropriate for patient activity level. Device programmed to optimize intrinsic conduction. Estimated longevity 10 year. Patient enrolled in remote follow-up. Patient education completed. Patient is instructed to continue normal activities and notify device clinic if she feels her HR remains too high and produces symptoms.

## 2021-01-11 ENCOUNTER — Other Ambulatory Visit: Payer: Self-pay

## 2021-01-11 MED ORDER — FLECAINIDE ACETATE 50 MG PO TABS: 50.0000 mg | ORAL_TABLET | Freq: Two times a day (BID) | ORAL | 3 refills | Status: DC

## 2021-01-18 DIAGNOSIS — G4733 Obstructive sleep apnea (adult) (pediatric): Secondary | ICD-10-CM | POA: Diagnosis not present

## 2021-01-24 DIAGNOSIS — G4733 Obstructive sleep apnea (adult) (pediatric): Secondary | ICD-10-CM | POA: Diagnosis not present

## 2021-01-27 ENCOUNTER — Ambulatory Visit (HOSPITAL_COMMUNITY)
Admission: RE | Admit: 2021-01-27 | Discharge: 2021-01-27 | Disposition: A | Discharge status: Home / Self Care | Payer: Medicare Other | Source: Ambulatory Visit | Attending: Nurse Practitioner | Admitting: Nurse Practitioner

## 2021-01-27 ENCOUNTER — Telehealth: Payer: Self-pay | Admitting: Internal Medicine

## 2021-01-27 ENCOUNTER — Other Ambulatory Visit: Payer: Self-pay

## 2021-01-27 VITALS — BP 134/66 | HR 73 | Ht 67.0 in | Wt 226.6 lb

## 2021-01-27 DIAGNOSIS — Z87891 Personal history of nicotine dependence: Secondary | ICD-10-CM | POA: Diagnosis not present

## 2021-01-27 DIAGNOSIS — Z950 Presence of cardiac pacemaker: Secondary | ICD-10-CM | POA: Diagnosis not present

## 2021-01-27 DIAGNOSIS — D6869 Other thrombophilia: Secondary | ICD-10-CM | POA: Diagnosis not present

## 2021-01-27 DIAGNOSIS — Z7901 Long term (current) use of anticoagulants: Secondary | ICD-10-CM | POA: Diagnosis not present

## 2021-01-27 DIAGNOSIS — I480 Paroxysmal atrial fibrillation: Secondary | ICD-10-CM

## 2021-01-27 MED ORDER — DILTIAZEM HCL 30 MG PO TABS: ORAL_TABLET | ORAL | 1 refills | Status: DC

## 2021-01-27 MED ORDER — PANTOPRAZOLE SODIUM 40 MG PO TBEC: 40.0000 mg | DELAYED_RELEASE_TABLET | Freq: Two times a day (BID) | ORAL | Status: AC

## 2021-01-27 NOTE — Telephone Encounter (Signed)
Patient called back to confirm transmission went through, I let patient we have received it. I let patient know a nurse will call when she reviews it

## 2021-01-27 NOTE — Telephone Encounter (Signed)
Called and spoke with patient, she is agreeable to appt today 01/27/21 with [facility].

## 2021-01-27 NOTE — Telephone Encounter (Signed)
Patient reports increased fatigue and palpitations. States she is not feeing well and doesn't want to stay in AF feeling like she is.  States she is only to do about 10% of her ADLs. Advised I will forward to Af clinic for follow up considering symptoms.  Patient is compliant with medications on file.

## 2021-01-27 NOTE — Telephone Encounter (Signed)
The patient LMOVM that she did send a transmission to be reviewed by nurse. I sent the patient a my chart message letting her know that we did not receive her transmission. I asked her to try again. I gave her [facility] tech support number in case the monitor did not send correctly.

## 2021-01-27 NOTE — Progress Notes (Signed)
Primary Care Physician: Colon Branch, MD Referring Physician: Dr. Rayann Heman EP: Dr. Ludger Nutting is a 76 y.o. female with a h/o PPM, afib and PVC's that is in the afib clinic for  ongoing afib. Device clinic contacted pt this am with persistent afib She feels poorly in  afib. She woke up in afib this am. She took an extra 100 mg flecainide and extra Cardizem. Her HR was over 100 bpm  when she first woke up but now is rate controlled in the 70's. She feels her afib burden is increasing. She will usually go back to SR within 24 hours. She was set up for cardioversion in December but converted prior to cardioversion.  She is on xarelto 20 mg daily for a CHA2DS2VASc  score of at least 3.   Today, she + for symptoms of palpitations, no  chest pain, shortness of breath, orthopnea, PND, lower extremity edema, dizziness, presyncope, syncope, or neurologic sequela.+ for brain fog. The patient is tolerating medications without difficulties and is otherwise without complaint today.   Past Medical History:  Diagnosis Date   Atrial fibrillation ([place])    Back pain    Chronic sinusitis    s/p 2 surgeries remotely, [name]   Closed fracture of metatarsal of left foot    L foot, fifth metatarsal   Ectopic pregnancy    Endometrial cancer ([place]) 2004   Early diagnosis   GERD (gastroesophageal reflux disease)    Hyperlipemia    Hypertension    IBS (irritable bowel syndrome) 08/13/2014   Dx 2015, diarrhea on-off, Dr [name]    Lumbar radiculopathy 08/2012   MRI in 08/2012   Mild hyperparathyroidism ([place])    Osteopenia    PAF (paroxysmal atrial fibrillation) (HCC)    Prediabetes    Sciatica of right side 08/2013   Received Depomedrol 80 mg injection   Sleep apnea 09/09/2013   Dx with OSA in 2015, by Dr. Caryl Comes, APAP   Past Surgical History:  Procedure Laterality Date   ABDOMINAL HYSTERECTOMY     ATRIAL FIBRILLATION ABLATION N/A 09/04/2017   Procedure: [place];  Surgeon:  [name], MD;  Location: [facility];  Service: Cardiovascular;  Laterality: N/A;   BIOPSY  03/11/2020   Procedure: BIOPSY;  Surgeon: [name], MD;  [facility];  Service: Endoscopy;;  EGD and COLON   CARPAL TUNNEL RELEASE Right 06/17/2019   CARPAL TUNNEL RELEASE Left 11/2020   CHOLECYSTECTOMY  ~2006   COLONOSCOPY WITH PROPOFOL N/A 03/11/2020   Procedure: COLONOSCOPY WITH PROPOFOL;  Surgeon: [name], MD;  [facility];  Service: Endoscopy;  Laterality: N/A;   DILATION AND CURETTAGE OF UTERUS     after SAB   ESOPHAGOGASTRODUODENOSCOPY  02/09/2014   Dr. 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/2020   Procedure: PACEMAKER IMPLANT;  Surgeon: [name], MD;  Location: [facility];  Service: Cardiovascular;  Laterality: N/A;   TONSILLECTOMY     TOTAL ABDOMINAL HYSTERECTOMY W/ BILATERAL SALPINGOOPHORECTOMY     TOTAL KNEE ARTHROPLASTY Right ~2008    Current Outpatient Medications  Medication Sig Dispense Refill   acetaminophen (TYLENOL)  500 MG tablet Take 500 mg by mouth every 6 (six) hours as needed for moderate pain or headache.     acetaminophen-codeine (TYLENOL #3) 300-30 MG tablet TAKE 1 TABLET BY MOUTH DAILY AS NEEDED FOR MODERATE PAIN 30 tablet 0   ARTIFICIAL TEAR SOLUTION OP Place 1 drop into both eyes 4 (four) times daily.     cholecalciferol (VITAMIN D3) 25 MCG (1000 UNIT) tablet Take 1,000 Units by mouth daily.     Coenzyme Q10 300 MG CAPS Take 300 mg by mouth daily.     CRANBERRY EXTRACT PO Take 2 tablets by mouth daily.     diltiazem (CARDIZEM CD) 120 MG 24 hr capsule Take 1 capsule (120 mg total) by mouth daily. 30 capsule 6    diltiazem (CARDIZEM) 30 MG tablet Take 1 tablet every 4 hours AS NEEDED for heart rate >100 as long as blood pressure >100. 45 tablet 5   Fish Oil-Cholecalciferol (OMEGA-3 + D PO) Take 1 capsule by mouth daily.     flecainide (TAMBOCOR) 50 MG tablet Take 1 tablet (50 mg total) by mouth 2 (two) times daily. 180 tablet 3   fluticasone (FLONASE) 50 MCG/ACT nasal spray Place 1 spray into both nostrils every other day.     furosemide (LASIX) 40 MG tablet Take 1 tablet (40 mg total) by mouth daily. 90 tablet 3   pravastatin (PRAVACHOL) 40 MG tablet TAKE 1 TABLET(40 MG) BY MOUTH AT BEDTIME 90 tablet 1   pyridOXINE (VITAMIN B-6) 100 MG tablet Take 100 mg by mouth daily.     rivaroxaban (XARELTO) 20 MG TABS tablet Take 1 tablet (20 mg total) by mouth daily with supper. 90 tablet 3   sucralfate (CARAFATE) 1 g tablet Take 1 g by mouth 4 (four) times daily -  with meals and at bedtime.     triamterene-hydrochlorothiazide (MAXZIDE-25) 37.5-25 MG tablet Take 0.5 tablets by mouth daily. 45 tablet 1   pantoprazole (PROTONIX) 40 MG tablet Take 1 tablet (40 mg total) by mouth 2 (two) times daily.     No current facility-administered medications for this encounter.    Allergies  Allergen Reactions   Ibuprofen Other (See Comments)    Pt is on Xarelto   Lactose Intolerance (Gi) Diarrhea and Nausea And Vomiting   Lisinopril Cough   Nsaids Other (See Comments)    Pt on Xarelto   Tape     Causes blisters    Tolmetin Other (See Comments)    Pt on Xarelto   Zoster Vaccine Live Other (See Comments)    Serum sickness, high fever, neck pain    Amoxicillin Rash   Penicillins Rash    Local reaction    Social History   Socioeconomic History   Marital status: Married    Spouse name: Not on file   Number of children: 1   Years of education: Not on file   Highest education level: Not on file  Occupational History   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  Tobacco Use   Smoking status: Former    Types:  Cigarettes    Quit date: 01/06/1980    Years since quitting: 41.0   Smokeless tobacco: Never   Tobacco comments:    smoked from [place] to 1982, less than 1 ppd  Vaping Use   Vaping Use: Never used  Substance and Sexual Activity   Alcohol use: Yes    Comment: rare   Drug use: No   Sexual activity: Not Currently    Birth  control/protection: None  Other Topics Concern   Not on file  Social History Narrative   Lives w/ husband, and son [name]   Retired Marine scientist   Lives in [place] Strain: Low Risk    Difficulty of Paying Living Expenses: Not hard at all  Food Insecurity: No Food Insecurity   Worried About Charity fundraiser in the Last Year: Never true   Arboriculturist in the Last Year: Never true  Transportation Needs: No Transportation Needs   Lack of Transportation (Medical): No   Lack of Transportation (Non-Medical): No  Physical Activity: Sufficiently Active   Days of Exercise per Week: 7 days   Minutes of Exercise per Session: 30 min  Stress: No Stress Concern Present   Feeling of Stress : Not at all  Social Connections: Moderately Integrated   Frequency of Communication with Friends and Family: More than three times a week   Frequency of Social Gatherings with Friends and Family: Once a week   Attends Religious Services: Never   Marine scientist or Organizations: Yes   Attends Music therapist: More than 4 times per year   Marital Status: Married  Human resources officer Violence: Not At Risk   Fear of Current or Ex-Partner: No   Emotionally Abused: No   Physically Abused: No   Sexually Abused: No    Family History  Problem Relation Age of Onset   Hypertension Mother    Colon cancer Mother    Breast cancer Mother    Ovarian cancer Mother    Hypertension Father    Diabetes Father    Head & neck cancer Sister    CAD Neg Hx     ROS- All systems are reviewed and negative except as per the HPI  above  Physical Exam: Vitals:   01/27/21 1412  Weight: 102.8 kg  Height: 5\' 7"  (1.702 m)   Wt Readings from Last 3 Encounters:  01/27/21 102.8 kg  12/14/20 100.2 kg  11/30/20 102.3 kg    Labs: Lab Results  Component Value Date   NA 140 12/14/2020   K 3.9 12/14/2020   CL 103 12/14/2020   CO2 24 12/14/2020   GLUCOSE 114 (H) 12/14/2020   BUN 10 12/14/2020   CREATININE 0.82 12/14/2020   CALCIUM 10.8 (H) 12/14/2020   MG 2.1 03/09/2020   Lab Results  Component Value Date   INR 2.5 (H) 11/08/2006   Lab Results  Component Value Date   CHOL 177 05/31/2018   HDL 61.70 05/31/2018   [place] 99 05/31/2018   TRIG 81.0 05/31/2018     GEN- The patient is well appearing, alert and oriented x 3 today.   Head- normocephalic, atraumatic Eyes-  Sclera clear, conjunctiva pink Ears- hearing intact Oropharynx- clear Neck- supple, no JVP Lymph- no cervical lymphadenopathy Lungs- Clear to ausculation bilaterally, normal work of breathing Heart- irregular rate and irregular  Rhythm(pvc's), no murmurs, rubs or gallops, PMI not laterally displaced GI- soft, NT, ND, + BS Extremities- no clubbing, cyanosis, or edema MS- no significant deformity or atrophy Skin- no rash or lesion Psych- euthymic mood, full affect Neuro- strength and sensation are intact  EKG- afib at 79 bpm , qrs int 162 ms, qtc 499 ms  Epic  records reviewed   08/08/19-1. Left ventricular ejection fraction, by estimation, is 60 to 65%. The left ventricle has normal function. The left ventricle has no regional wall motion  abnormalities. Left ventricular diastolic parameters are consistent with Grade II diastolic dysfunction (pseudonormalization). Elevated left ventricular end-diastolic pressure. 2. Right ventricular systolic function is normal. The right ventricular size is normal. There is normal pulmonary artery systolic pressure. 3. Left atrial size was severely dilated. 4. The mitral valve is normal in structure.  Trivial mitral valve regurgitation. No evidence of mitral stenosis. 5. The aortic valve is tricuspid. Aortic valve regurgitation is not visualized. No aortic stenosis is present. 6. The inferior vena cava is normal in size with greater than 50% respiratory variability, suggesting right atrial pressure of 3 mmHg.   Assessment and Plan: 1. Paroxysmal afib  This episode started this am  Pt feels her afib burden is increasing  We discussed options Dr. Caryl Comes has mentioned possibility of change to Tikosyn to her in the past We discussed this option more today She has severely dilated left atrium so I don't know if she would be the best ablation candidate  She usually will go back to SR within 24 hours  She will continue to use prn Cardizem 30 mg tablets  but avoid extra flecainide for now since she took an extra dose this am  Our elective cardioversion's are running 2 weeks out but if this persists and she feels she is unstable or severely symptomatic with it, going to the ER for an urgent cardioversion is an option  Otherwise, she has an appointment pending with Dr. Caryl Comes 2/2 and  can discuss options further   2. CHA2DS2VASc score of 3 Continue xarelto 20 mg daily  F/u with Dr. Caryl Comes 2/2  Geroge Baseman. Travonte Byard, [facility] [address] [place], [place] [address] [phone]

## 2021-01-27 NOTE — Telephone Encounter (Signed)
° °  Patient c/o Palpitations:  High priority if patient c/o lightheadedness, shortness of breath, or chest pain  How long have you had palpitations/irregular HR/ Afib? Are you having the symptoms now? Yes   Are you currently experiencing lightheadedness, SOB or CP? lightheadedness  Do you have a history of afib (atrial fibrillation) or irregular heart rhythm? Yes   Have you checked your BP or HR? (document readings if available): HR 112 , BP 123/74  Are you experiencing any other symptoms? Pt said her Afib started this morning between 4 am - 6 am. She would like to speak with Dr. Olin Pia nurse. She said she will also send transmission

## 2021-01-28 ENCOUNTER — Encounter (HOSPITAL_COMMUNITY): Payer: Self-pay | Admitting: Nurse Practitioner

## 2021-01-28 DIAGNOSIS — B9689 Other specified bacterial agents as the cause of diseases classified elsewhere: Secondary | ICD-10-CM | POA: Diagnosis not present

## 2021-01-28 DIAGNOSIS — K639 Disease of intestine, unspecified: Secondary | ICD-10-CM | POA: Diagnosis not present

## 2021-01-28 DIAGNOSIS — A048 Other specified bacterial intestinal infections: Secondary | ICD-10-CM | POA: Diagnosis not present

## 2021-01-31 ENCOUNTER — Other Ambulatory Visit: Payer: Self-pay

## 2021-01-31 MED ORDER — RIVAROXABAN 20 MG PO TABS: 20.0000 mg | ORAL_TABLET | Freq: Every day | ORAL | 3 refills | Status: DC

## 2021-01-31 NOTE — Telephone Encounter (Signed)
Prescription refill request for Xarelto received.  Indication: Afib  Last office visit: 01/27/21 Kayleen Memos)  Weight: 102.8kg Age: 76 Scr: 0.82 (12/14/20)  CrCl: 99.44ml/min  Appropriate dose and refill sent to requested pharmacy.

## 2021-02-03 ENCOUNTER — Other Ambulatory Visit: Payer: Self-pay

## 2021-02-03 ENCOUNTER — Encounter: Payer: Self-pay | Admitting: Internal Medicine

## 2021-02-03 ENCOUNTER — Ambulatory Visit: Payer: Medicare Other | Admitting: Internal Medicine

## 2021-02-03 VITALS — BP 136/74 | HR 64 | Ht 67.0 in | Wt 224.0 lb

## 2021-02-03 DIAGNOSIS — I493 Ventricular premature depolarization: Secondary | ICD-10-CM | POA: Diagnosis not present

## 2021-02-03 DIAGNOSIS — R001 Bradycardia, unspecified: Secondary | ICD-10-CM | POA: Diagnosis not present

## 2021-02-03 DIAGNOSIS — I480 Paroxysmal atrial fibrillation: Secondary | ICD-10-CM

## 2021-02-03 DIAGNOSIS — Z950 Presence of cardiac pacemaker: Secondary | ICD-10-CM

## 2021-02-03 MED ORDER — FLECAINIDE ACETATE 100 MG PO TABS: 100.0000 mg | ORAL_TABLET | Freq: Two times a day (BID) | ORAL | 3 refills | Status: DC

## 2021-02-03 NOTE — Progress Notes (Addendum)
Patient ID: Veronica Bradford, female   DOB: 1945/01/04, 76 y.o.   MRN: [id number]      Patient Care Team: Colon Branch, MD as PCP - General (Internal Medicine) [name], MD as PCP - Cardiology (Cardiology) Marius Ditch, MD as Consulting Physician (Internal Medicine) [name], MD as Consulting Physician (Cardiology) Wylene Simmer, MD as Consulting Physician (Orthopedic Surgery) Armandina Gemma, MD as Consulting Physician (General Surgery) [name], MD as Consulting Physician (Gastroenterology) [place], [place], [place] as Consulting Physician (Optometry) Sherlynn Stalls, MD as Consulting Physician (Ophthalmology) Roseanne Kaufman, MD as Consulting Physician (Orthopedic Surgery)   HPI  Veronica Bradford is a 76 y.o. 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  She does not need to get cardioverted however.  Atrial fibrillation seems to be associated in some way with her IBS which is associated with diarrhea.  She has treated sleep apnea with good reports.   Anticoagulation with Rivaroxaban and no bleeding issues   More recently has had episodes of atrial fibrillation about once every month or 2 lasting 4 -9 hours,  At last visit with persistent atrial fibrillation she was set up for cardioversion; she reverted spontaneously.  When she saw Roderic Palau in the A-fib clinic last week, she was noted again to be in atrial fibrillation.  She is in sinus rhythm today.  She continues on flecainide 50 mg twice daily.  Her PVCs largely suppressed by this as well as her pacemaker with which she has felt overall much much better.     Date Cr Hgb  K Mag  5/18 0.66 14.7 3.7 2.1  12/21 0.59 13.3 3.9 2.2 6  4/22 0.58 13.7 3.6   9/22 0.71     11/22 3.5 10.2        DATE TEST EF   9/15 Myoview  65 % No ischemia  8/17 Echo   60-65 %   8/21 Myoview 71% No ischemia  8/21 Echo  60-65% LAE-mod-severe  (42.5 ml/m2)             DATE PR interval QRSduration Dose       1/23  300 162 50  2//23 post increase  Ap 310 180 100        Past Medical History:  Diagnosis Date   Atrial fibrillation (HCC)    Back pain    Chronic sinusitis    s/p 2 surgeries remotely, [name]   Closed fracture of metatarsal of left foot    L foot, fifth metatarsal   Ectopic pregnancy    Endometrial cancer ([place]) 2004   Early diagnosis   GERD (gastroesophageal reflux disease)    Hyperlipemia    Hypertension    IBS (irritable bowel syndrome) 08/13/2014   Dx 2015, diarrhea on-off, Dr [name]    Lumbar radiculopathy 08/2012   MRI in 08/2012   Mild hyperparathyroidism ([place])    Osteopenia    PAF (paroxysmal atrial fibrillation) (HCC)    Prediabetes    Sciatica of right side 08/2013   Received Depomedrol 80 mg injection   Sleep apnea 09/09/2013   Dx with OSA in 2015, by Dr. Caryl Comes, APAP    Past Surgical History:  Procedure Laterality Date   [place]  N/A 09/04/2017   Procedure: ATRIAL FIBRILLATION ABLATION;  Surgeon: [name], MD;  Location: [facility];  Service: Cardiovascular;  Laterality: N/A;   BIOPSY  03/11/2020   Procedure: BIOPSY;  Surgeon: [name], MD;  [facility];  Service: Endoscopy;;  EGD and COLON   CARPAL TUNNEL RELEASE Right 06/17/2019   CARPAL TUNNEL RELEASE Left 11/2020   CHOLECYSTECTOMY  ~2006   COLONOSCOPY WITH PROPOFOL N/A 03/11/2020   Procedure: COLONOSCOPY WITH PROPOFOL;  Surgeon: [name], MD;  [facility];  Service: Endoscopy;  Laterality: N/A;   DILATION AND CURETTAGE OF UTERUS     after SAB   ESOPHAGOGASTRODUODENOSCOPY  02/09/2014   Dr. [name]   ESOPHAGOGASTRODUODENOSCOPY (EGD) WITH PROPOFOL N/A 03/11/2020   Procedure:  ESOPHAGOGASTRODUODENOSCOPY (EGD) WITH PROPOFOL;  Surgeon: [name], MD;  [facility];  Service: Endoscopy;  Laterality: N/A;   FINGER SURGERY Left    index   NASAL SINUS SURGERY     x 2 remotely, [name]   PACEMAKER IMPLANT N/A 09/13/2020   Procedure: PACEMAKER IMPLANT;  Surgeon: [name], MD;  Location: [facility];  Service: Cardiovascular;  Laterality: N/A;   TONSILLECTOMY     TOTAL ABDOMINAL HYSTERECTOMY W/ BILATERAL SALPINGOOPHORECTOMY     TOTAL KNEE ARTHROPLASTY Right ~2008    Current Outpatient Medications  Medication Sig Dispense Refill   acetaminophen (TYLENOL) 500 MG tablet Take 500 mg by mouth every 6 (six) hours as needed for moderate pain or headache.     acetaminophen-codeine (TYLENOL #3) 300-30 MG tablet TAKE 1 TABLET BY MOUTH DAILY AS NEEDED FOR MODERATE PAIN 30 tablet 0   ARTIFICIAL TEAR SOLUTION OP Place 1 drop into both eyes 4 (four) times daily.     cholecalciferol (VITAMIN D3) 25 MCG (1000 UNIT) tablet Take 1,000 Units by mouth daily.     Coenzyme Q10 300 MG CAPS Take 300 mg by mouth daily.     CRANBERRY EXTRACT PO Take 2 tablets by mouth daily.     diltiazem (CARDIZEM CD) 120 MG 24 hr capsule Take 1 capsule (120 mg total) by mouth daily. 30 capsule 6   diltiazem (CARDIZEM) 30 MG tablet Take 1 tablet every 4 hours AS NEEDED for heart rate >100 as long as blood pressure >100. 45 tablet 1   Fish Oil-Cholecalciferol (OMEGA-3 + D PO) Take 1 capsule by mouth daily.     flecainide (TAMBOCOR) 50 MG tablet Take 1 tablet (50 mg total) by mouth 2 (two) times daily. 180 tablet 3   fluticasone (FLONASE) 50 MCG/ACT nasal spray Place 1 spray into both nostrils every other day.     furosemide (LASIX) 40 MG tablet Take 1 tablet (40 mg total) by mouth daily. 90 tablet 3   pantoprazole (PROTONIX) 40 MG tablet Take 1 tablet (40 mg total) by mouth 2 (two) times daily.     pravastatin (PRAVACHOL) 40 MG tablet TAKE 1 TABLET(40 MG) BY MOUTH AT BEDTIME 90  tablet 1   pyridOXINE (VITAMIN B-6) 100 MG tablet Take 100 mg by mouth daily.     rivaroxaban (XARELTO) 20 MG TABS tablet Take 1 tablet (20 mg total) by mouth daily with supper. 90 tablet 3   sucralfate (CARAFATE) 1 g tablet Take 1 g by mouth 4 (four) times daily -  with meals and at bedtime.     triamterene-hydrochlorothiazide (MAXZIDE-25) 37.5-25 MG tablet Take 0.5 tablets by mouth daily. 45 tablet 1   No current facility-administered medications for this visit.  Allergies  Allergen Reactions   Ibuprofen Other (See Comments)    Pt is on Xarelto   Lactose Intolerance (Gi) Diarrhea and Nausea And Vomiting   Lisinopril Cough   Nsaids Other (See Comments)    Pt on Xarelto   Tape     Causes blisters    Tolmetin Other (See Comments)    Pt on Xarelto   Zoster Vaccine Live Other (See Comments)    Serum sickness, high fever, neck pain    Amoxicillin Rash   Penicillins Rash    Local reaction    Review of Systems negative except from HPI and PMH (+) Shortness of Breath (+) Lightheadedness (+) LE Edema  Physical Exam: BP 136/74 (BP Location: Left Arm, Patient Position: Sitting, Cuff Size: Small)    Pulse 64    Ht 5\' 7"  (1.702 m)    Wt 224 lb (101.6 kg)    BMI 35.08 kg/m  Device pocket well healed; without hematoma or erythema.  There is no tethering  Well developed and well nourished in no acute distress HENT normal Neck supple with JVP-flat Clear Device pocket well healed; without hematoma or erythema.  There is no tethering  Regular rate and rhythm, 2/6 murmur Abd-soft with active BS No Clubbing cyanosis   edema Skin-warm and dry A & Oriented  Grossly normal sensory and motor function  ECG sinus rhythm at 64 with PVCs   Assessment and  Plan  Atrial fibrillation-flutter persistent  CHADS-VASc score 2 (age/gender)    Obesity  RBBB/left axis deviation  PVCs  Sinus bradycardia/junctional rhythm  Recurrent self terminating episodes of atrial fibrillation  occurring every 4 to 6 weeks.  I do not think cardioversion makes sense in the absence of doing something to change her electrical milieu.  We discussed options for this including increasing her flecainide from 50--100 mg twice daily, sotalol versus dofetilide or amiodarone.  As I think dofetilide is more effective than sotalol for similar risk, would not use sotalol.  She is disinclined at her age to use amiodarone and I would concur.  Hence, we will try the higher dose flecainide first and if that does not work consider dofetilide.  We will also plan to obtain a 2D echo to assess left atrial size to help inform potential roles of catheter ablation.  She also will continue her Xarelto 20 mg a day for right now although with gastritis and some anemia, she would like [place] to r review for her the potential relative merits of resuming apixaban

## 2021-02-03 NOTE — Patient Instructions (Signed)
Medication Instructions:  Your physician has recommended you make the following change in your medication: Increase your Flecanide to 100mg  - 1 tablet by mouth twice daily.   *If you need a refill on your cardiac medications before your next appointment, please call your pharmacy*   Lab Work: None ordered.  If you have labs (blood work) drawn today and your tests are completely normal, you will receive your results only by: [place] (if you have MyChart) OR A paper copy in the mail If you have any lab test that is abnormal or we need to change your treatment, we will call you to review the results.   Testing/Procedures: None ordered.    Follow-Up: At [facility], you and your health needs are our priority.  As part of our continuing mission to provide you with exceptional heart care, we have created designated Provider Care Teams.  These Care Teams include your primary Cardiologist (physician) and Advanced Practice Providers (APPs -  Physician Assistants and Nurse Practitioners) who all work together to provide you with the care you need, when you need it.  We recommend signing up for the patient portal called "MyChart".  Sign up information is provided on this After Visit Summary.  MyChart is used to connect with patients for Virtual Visits (Telemedicine).  Patients are able to view lab/test results, encounter notes, upcoming appointments, etc.  Non-urgent messages can be sent to your provider as well.   To learn more about what you can do with MyChart, go to [email or web address].    Your next appointment:   6 months with [name]

## 2021-02-04 ENCOUNTER — Encounter: Payer: Self-pay | Admitting: Internal Medicine

## 2021-02-11 ENCOUNTER — Ambulatory Visit (HOSPITAL_COMMUNITY): Payer: Medicare Other | Attending: Cardiology

## 2021-02-11 ENCOUNTER — Other Ambulatory Visit: Payer: Self-pay

## 2021-02-11 DIAGNOSIS — I480 Paroxysmal atrial fibrillation: Secondary | ICD-10-CM | POA: Insufficient documentation

## 2021-02-12 LAB — ECHOCARDIOGRAM COMPLETE
Area-P 1/2: 3.32 cm2
S' Lateral: 2.6 cm

## 2021-02-15 ENCOUNTER — Ambulatory Visit (INDEPENDENT_AMBULATORY_CARE_PROVIDER_SITE_OTHER): Payer: Medicare Other

## 2021-02-15 ENCOUNTER — Other Ambulatory Visit: Payer: Self-pay

## 2021-02-15 VITALS — BP 136/68 | HR 60 | Wt 227.6 lb

## 2021-02-15 DIAGNOSIS — Z79899 Other long term (current) drug therapy: Secondary | ICD-10-CM

## 2021-02-15 DIAGNOSIS — I480 Paroxysmal atrial fibrillation: Secondary | ICD-10-CM | POA: Diagnosis not present

## 2021-02-15 NOTE — Patient Instructions (Signed)
Medication Instructions:  Your physician recommends that you continue on your current medications as directed. Please refer to the Current Medication list given to you today.  *If you need a refill on your cardiac medications before your next appointment, please call your pharmacy*   Lab Work: None ordered.  If you have labs (blood work) drawn today and your tests are completely normal, you will receive your results only by: . MyChart Message (if you have MyChart) OR . A paper copy in the mail If you have any lab test that is abnormal or we need to change your treatment, we will call you to review the results.   Testing/Procedures: None ordered.    Follow-Up: At [facility], you and your health needs are our priority.  As part of our continuing mission to provide you with exceptional heart care, we have created designated Provider Care Teams.  These Care Teams include your primary Cardiologist (physician) and Advanced Practice Providers (APPs -  Physician Assistants and Nurse Practitioners) who all work together to provide you with the care you need, when you need it.  We recommend signing up for the patient portal called "MyChart".  Sign up information is provided on this After Visit Summary.  MyChart is used to connect with patients for Virtual Visits (Telemedicine).  Patients are able to view lab/test results, encounter notes, upcoming appointments, etc.  Non-urgent messages can be sent to your provider as well.   To learn more about what you can do with MyChart, go to [email or web address].    Your next appointment:   As scheduled  

## 2021-02-15 NOTE — Progress Notes (Signed)
Pt presents today at the request of [name] for an EKG d/t increase in Flecainide at pt's last OV on 02/03/2021.  She states she is feeling well today with exception of being tired due to not sleeping but about 5.5 hours,   Pt is currently taking Flecainide 100mg  bid.  She reports she is tolerating medication without problems.  EKG completed and reviewed by [name].  Per [name] pt may continue your current dose of Flecainide 100mg  bid.  Pt advised to continue current dosing and verbalized understanding.

## 2021-02-22 ENCOUNTER — Encounter: Payer: Self-pay | Admitting: Internal Medicine

## 2021-02-24 DIAGNOSIS — G4733 Obstructive sleep apnea (adult) (pediatric): Secondary | ICD-10-CM | POA: Diagnosis not present

## 2021-03-15 ENCOUNTER — Ambulatory Visit (INDEPENDENT_AMBULATORY_CARE_PROVIDER_SITE_OTHER): Payer: Medicare Other

## 2021-03-15 DIAGNOSIS — I495 Sick sinus syndrome: Secondary | ICD-10-CM | POA: Diagnosis not present

## 2021-03-15 LAB — CUP PACEART REMOTE DEVICE CHECK
Battery Remaining Longevity: 113 mo
Battery Remaining Percentage: 95.5 %
Battery Voltage: 3.02 V
Brady Statistic AP VP Percent: 7.3 %
Brady Statistic AP VS Percent: 92 %
Brady Statistic AS VP Percent: 1 %
Brady Statistic AS VS Percent: 1.2 %
Brady Statistic RA Percent Paced: 98 %
Brady Statistic RV Percent Paced: 7.5 %
Date Time Interrogation Session: 20230314020013
Implantable Lead Implant Date: 20220912
Implantable Lead Implant Date: 20220912
Implantable Lead Location: 753859
Implantable Lead Location: 753860
Implantable Lead Model: 3830
Implantable Pulse Generator Implant Date: 20220912
Lead Channel Impedance Value: 430 Ohm
Lead Channel Impedance Value: 600 Ohm
Lead Channel Pacing Threshold Amplitude: 0.5 V
Lead Channel Pacing Threshold Amplitude: 1 V
Lead Channel Pacing Threshold Pulse Width: 0.4 ms
Lead Channel Pacing Threshold Pulse Width: 0.4 ms
Lead Channel Sensing Intrinsic Amplitude: 1.8 mV
Lead Channel Sensing Intrinsic Amplitude: 10.2 mV
Lead Channel Setting Pacing Amplitude: 2 V
Lead Channel Setting Pacing Amplitude: 2.5 V
Lead Channel Setting Pacing Pulse Width: 0.4 ms
Lead Channel Setting Sensing Sensitivity: 2 mV
Pulse Gen Model: 2272
Pulse Gen Serial Number: 3956585

## 2021-03-18 DIAGNOSIS — D509 Iron deficiency anemia, unspecified: Secondary | ICD-10-CM | POA: Diagnosis not present

## 2021-03-18 DIAGNOSIS — K6389 Other specified diseases of intestine: Secondary | ICD-10-CM | POA: Diagnosis not present

## 2021-03-18 LAB — IRON,TIBC AND FERRITIN PANEL
%SAT: 8
Ferritin: 13.7
Iron: 31
TIBC: 406

## 2021-03-18 LAB — CBC AND DIFFERENTIAL
HCT: 38 (ref 36–46)
Hemoglobin: 12.5 (ref 12.0–16.0)
Platelets: 232 10*3/uL (ref 150–400)
WBC: 4.2

## 2021-03-18 LAB — CBC: RBC: 4.19 (ref 3.87–5.11)

## 2021-03-23 ENCOUNTER — Ambulatory Visit (INDEPENDENT_AMBULATORY_CARE_PROVIDER_SITE_OTHER): Payer: Medicare Other | Admitting: Internal Medicine

## 2021-03-23 ENCOUNTER — Encounter: Payer: Self-pay | Admitting: Internal Medicine

## 2021-03-23 ENCOUNTER — Other Ambulatory Visit (HOSPITAL_BASED_OUTPATIENT_CLINIC_OR_DEPARTMENT_OTHER): Payer: Self-pay

## 2021-03-23 VITALS — BP 128/62 | HR 74 | Temp 98.0°F | Resp 16 | Ht 67.0 in | Wt 230.0 lb

## 2021-03-23 DIAGNOSIS — K909 Intestinal malabsorption, unspecified: Secondary | ICD-10-CM

## 2021-03-23 DIAGNOSIS — Z23 Encounter for immunization: Secondary | ICD-10-CM

## 2021-03-23 DIAGNOSIS — I10 Essential (primary) hypertension: Secondary | ICD-10-CM

## 2021-03-23 DIAGNOSIS — E213 Hyperparathyroidism, unspecified: Secondary | ICD-10-CM | POA: Diagnosis not present

## 2021-03-23 DIAGNOSIS — M8580 Other specified disorders of bone density and structure, unspecified site: Secondary | ICD-10-CM | POA: Diagnosis not present

## 2021-03-23 DIAGNOSIS — I480 Paroxysmal atrial fibrillation: Secondary | ICD-10-CM | POA: Diagnosis not present

## 2021-03-23 DIAGNOSIS — Z0000 Encounter for general adult medical examination without abnormal findings: Secondary | ICD-10-CM | POA: Diagnosis not present

## 2021-03-23 DIAGNOSIS — E785 Hyperlipidemia, unspecified: Secondary | ICD-10-CM | POA: Diagnosis not present

## 2021-03-23 DIAGNOSIS — Z0001 Encounter for general adult medical examination with abnormal findings: Secondary | ICD-10-CM

## 2021-03-23 DIAGNOSIS — E559 Vitamin D deficiency, unspecified: Secondary | ICD-10-CM | POA: Diagnosis not present

## 2021-03-23 DIAGNOSIS — Z01419 Encounter for gynecological examination (general) (routine) without abnormal findings: Secondary | ICD-10-CM

## 2021-03-23 DIAGNOSIS — R739 Hyperglycemia, unspecified: Secondary | ICD-10-CM

## 2021-03-23 LAB — LIPID PANEL
Cholesterol: 173 mg/dL (ref 0–200)
HDL: 69.4 mg/dL (ref >39.00)
LDL Cholesterol: 89 mg/dL (ref 0–99)
NonHDL: 103.61
Total CHOL/HDL Ratio: 2
Triglycerides: 75 mg/dL (ref 0.0–149.0)
VLDL: 15 mg/dL (ref 0.0–40.0)

## 2021-03-23 LAB — MAGNESIUM: Magnesium: 2.1 mg/dL (ref 1.5–2.5)

## 2021-03-23 LAB — COMPREHENSIVE METABOLIC PANEL
ALT: 19 U/L (ref 0–35)
AST: 21 U/L (ref 0–37)
Albumin: 4.4 g/dL (ref 3.5–5.2)
Alkaline Phosphatase: 64 U/L (ref 39–117)
BUN: 16 mg/dL (ref 6–23)
CO2: 30 mEq/L (ref 19–32)
Calcium: 10.9 mg/dL — ABNORMAL HIGH (ref 8.4–10.5)
Chloride: 105 mEq/L (ref 96–112)
Creatinine, Ser: 0.59 mg/dL (ref 0.40–1.20)
GFR: 89.51 mL/min (ref >60.00)
Glucose, Bld: 88 mg/dL (ref 70–99)
Potassium: 3.6 mEq/L (ref 3.5–5.1)
Sodium: 141 mEq/L (ref 135–145)
Total Bilirubin: 0.5 mg/dL (ref 0.2–1.2)
Total Protein: 6.5 g/dL (ref 6.0–8.3)

## 2021-03-23 LAB — HEMOGLOBIN A1C: Hgb A1c MFr Bld: 4.8 % (ref 4.6–6.5)

## 2021-03-23 LAB — VITAMIN D 25 HYDROXY (VIT D DEFICIENCY, FRACTURES): VITD: 48.4 ng/mL (ref 30.00–100.00)

## 2021-03-23 LAB — TSH: TSH: 1.3 u[IU]/mL (ref 0.35–5.50)

## 2021-03-23 LAB — B12 AND FOLATE PANEL
Folate: >24.2 ng/mL (ref >5.9)
Vitamin B-12: >1504 pg/mL — ABNORMAL HIGH (ref 211–911)

## 2021-03-23 MED ORDER — BOOSTRIX 5-2.5-18.5 LF-MCG/0.5 IM SUSY
PREFILLED_SYRINGE | INTRAMUSCULAR | 0 refills | Status: DC
  Filled 2021-03-23: qty 0.5, 1d supply, fill #0

## 2021-03-23 NOTE — Patient Instructions (Addendum)
Vaccines I recommend: ?Tdap ? ?Check the  blood pressure regularly ?BP GOAL is between 110/65 and  135/85. ?If it is consistently higher or lower, let me know ? ?  ? ? ?GO TO THE LAB : Get the blood work   ? ? ?[place], [place] ?Come back for a checkup in 4 months ? ? ? ? ?"Living will", "Health Care Power of attorney": Advanced care planning ? ?(If you already have a living will or healthcare power of attorney, please bring the copy to be scanned in your chart.) ? ?Advance care planning is a process that supports adults in  understanding and sharing their preferences regarding future medical care.  ? ?The patient's preferences are recorded in documents called Advance Directives.    ?Advanced directives are completed (and can be modified at any time) while the patient is in full mental capacity.  ? ?The documentation should be available at all times to the patient, the family and the healthcare providers.  ?Bring in a copy to be scanned in your chart is an excellent idea and is recommended  ? ?This legal documents direct treatment decision making and/or appoint a surrogate to make the decision if the patient is not capable to do so.  ? ? ?Advance directives can be documented in many types of formats,  documents have names such as:  ?Lliving will  ?Durable power of attorney for healthcare (healthcare proxy or healthcare power of attorney)  ?Combined directives  ?Physician orders for life-sustaining treatment  ?  ?More information at: ? [email or web address]  ?

## 2021-03-23 NOTE — Progress Notes (Signed)
? ?Subjective:  ? ? Patient ID: Veronica Bradford, female    DOB: 1945-04-01, 76 y.o.   MRN: [id number] ? ?DOS:  03/23/2021 ?Type of visit - description: CPX ? ?Here for CPX ?In addition, the patient requested to discuss all his chronic medical issues. ?Recently went to GI, labs were drawn.  They are reviewed today. ?Dx with small bowel bacterial overgrowth, started antibiotics yesterday. ?Request multiple labs. ? ?Denies chest pain or difficulty breathing. ?Lower extremity edema is chronic ?Denied dysuria or gross hematuria ?Having a number of GI symptoms including some nausea and vomiting.  Occasional diarrhea, a lot of gas and flatulence.  No blood in the stools. ?. ?Review of Systems ? ?Other than above, a 14 point review of systems is negative  ? ?  ? ?Past Medical History:  ?Diagnosis Date  ? Atrial fibrillation ([place])   ? Back pain   ? Chronic sinusitis   ? s/p 2 surgeries remotely, [name]  ? Closed fracture of metatarsal of left foot   ? L foot, fifth metatarsal  ? Ectopic pregnancy   ? Endometrial cancer [facility]) 2004  ? Early diagnosis  ? GERD (gastroesophageal reflux disease)   ? Hyperlipemia   ? Hypertension   ? IBS (irritable bowel syndrome) 08/13/2014  ? Dx 2015, diarrhea on-off, Dr [name]   ? Lumbar radiculopathy 08/2012  ? MRI in 08/2012  ? 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 Osteopenia   ? PAF (paroxysmal atrial fibrillation) ([place])   ? Prediabetes   ? Sciatica of right side 08/2013  ? Received Depomedrol 80 mg injection  ? Sleep apnea 09/09/2013  ? Dx with OSA in 2015, by Dr. Caryl Comes, APAP  ? ? ?Past Surgical History:  ?Procedure Laterality Date  ? ABDOMINAL HYSTERECTOMY    ? ATRIAL FIBRILLATION ABLATION N/A 09/04/2017  ? Procedure: ATRIAL FIBRILLATION ABLATION;  Surgeon: [name], MD;  Location: [place] [facility];  Service: Cardiovascular;  Laterality: N/A;  ? BIOPSY  03/11/2020  ? Procedure: BIOPSY;  Surgeon: [name], MD;  Location: [facility];  Service: Endoscopy;;  EGD and COLON  ? CARPAL  TUNNEL RELEASE Right 06/17/2019  ? CARPAL TUNNEL RELEASE Left 11/2020  ? CHOLECYSTECTOMY  ~2006  ? COLONOSCOPY WITH PROPOFOL N/A 03/11/2020  ? Procedure: COLONOSCOPY WITH PROPOFOL;  Surgeon: [name], MD;  Location: [facility] ENDOSCOPY;  Service: Endoscopy;  Laterality: N/A;  ? DILATION AND CURETTAGE OF UTERUS    ? after SAB  ? ESOPHAGOGASTRODUODENOSCOPY  02/09/2014  ? Dr. [name]  ? ESOPHAGOGASTRODUODENOSCOPY (EGD) WITH PROPOFOL N/A 03/11/2020  ? Procedure: ESOPHAGOGASTRODUODENOSCOPY (EGD) WITH PROPOFOL;  Surgeon: [name], MD;  Location: [facility] ENDOSCOPY;  Service: Endoscopy;  Laterality: N/A;  ? FINGER SURGERY Left   ? index  ? NASAL SINUS SURGERY    ? x 2 remotely, [name]  ? PACEMAKER IMPLANT N/A 09/13/2020  ? Procedure: PACEMAKER IMPLANT;  Surgeon: [name], MD;  Location: [place] [facility];  Service: Cardiovascular;  Laterality: N/A;  ? TONSILLECTOMY    ? TOTAL ABDOMINAL HYSTERECTOMY W/ BILATERAL SALPINGOOPHORECTOMY    ? TOTAL KNEE ARTHROPLASTY Right ~2008  ? ? ?Social History  ? ?Socioeconomic History  ? Marital status: Married  ?  Spouse name: Not on file  ? Number of children: 1  ? Years of education: Not on file  ? Highest education level: Not on file  ?Occupational History  ? 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 Smoking status: Former  ?  Types:  Cigarettes  ?  Quit date: 01/06/1980  ?  Years since quitting: 41.2  ? Smokeless tobacco: Never  ? Tobacco comments:  ?  smoked from 1970 to 1982, less than 1 ppd  ?Vaping Use  ? Vaping Use: Never used  ?Substance and Sexual Activity  ? Alcohol use: Yes  ?  Comment: rare  ? Drug use: No  ? Sexual activity: Not Currently  ?  Birth control/protection: None  ?Other Topics Concern  ? Not on file  ?Social History Narrative  ? Lives w/ husband, and son [name]  ? Retired Marine scientist  ? Lives in [place]  ? ?Social Determinants of Health  ? ?Financial Resource Strain: Low Risk   ? Difficulty of Paying Living Expenses: Not hard at all  ?Food Insecurity:  No Food Insecurity  ? Worried About Charity fundraiser in the Last Year: Never true  ? Ran Out of Food in the Last Year: Never true  ?Transportation Needs: No Transportation Needs  ? Lack of Transportation (Medical): No  ? Lack of Transportation (Non-Medical): No  ?Physical Activity: Sufficiently Active  ? Days of Exercise per Week: 7 days  ? Minutes of Exercise per Session: 30 min  ?Stress: No Stress Concern Present  ? Feeling of Stress : Not at all  ?Social Connections: Moderately Integrated  ? Frequency of Communication with Friends and Family: More than three times a week  ? Frequency of Social Gatherings with Friends and Family: Once a week  ? Attends Religious Services: Never  ? Active Member of Clubs or Organizations: Yes  ? Attends Archivist Meetings: More than 4 times per year  ? Marital Status: Married  ?Intimate Partner Violence: Not At Risk  ? Fear of Current or Ex-Partner: No  ? Emotionally Abused: No  ? Physically Abused: No  ? Sexually Abused: No  ? ? ? ?Current Outpatient Medications  ?Medication Instructions  ? acetaminophen (TYLENOL) 500 mg, Oral, Every 6 hours PRN  ? acetaminophen-codeine (TYLENOL #3) 300-30 MG tablet 1 tablet, Oral, Daily PRN  ? ARTIFICIAL TEAR SOLUTION OP 1 drop, Both Eyes, 4 times daily  ? cholecalciferol (VITAMIN D3) 1,000 Units, Oral, Daily  ? CRANBERRY EXTRACT PO 2 tablets, Oral, Daily  ? diltiazem (CARDIZEM CD) 120 mg, Oral, Daily  ? diltiazem (CARDIZEM) 30 MG tablet Take 1 tablet every 4 hours AS NEEDED for heart rate >100 as long as blood pressure >100.  ? Fish Oil-Cholecalciferol (OMEGA-3 + D PO) 1 capsule, Oral, Daily  ? flecainide (TAMBOCOR) 100 mg, Oral, 2 times daily  ? fluticasone (FLONASE) 50 MCG/ACT nasal spray 1 spray, Each Nare, Every other day  ? furosemide (LASIX) 40 mg, Oral, Daily  ? metroNIDAZOLE (FLAGYL) 250 MG tablet 1 tablet, Oral, Every 12 hours  ? neomycin (MYCIFRADIN) 500 MG tablet 1 tablet  ? pantoprazole (PROTONIX) 40 mg, Oral, 2 times  daily  ? pravastatin (PRAVACHOL) 40 MG tablet TAKE 1 TABLET(40 MG) BY MOUTH AT BEDTIME  ? pyridOXINE (VITAMIN B-6) 100 mg, Oral, Daily  ? rifaximin (XIFAXAN) 550 MG TABS tablet 1 tablet, Oral, Every 12 hours  ? rivaroxaban (XARELTO) 20 mg, Oral, Daily with supper  ? Tdap (BOOSTRIX) 5-2.5-18.5 LF-MCG/0.5 injection Intramuscular  ? triamterene-hydrochlorothiazide (MAXZIDE-25) 37.5-25 MG tablet 0.5 tablets, Oral, Daily  ? ? ?   ?Objective:  ? Physical Exam ?BP 128/62 (BP Location: Left Arm, Patient Position: Sitting, Cuff Size: Normal)   Pulse 74   Temp 98 ?F (36.7 ?C) (Oral)   Resp  16   Ht '5\' 7"'$  (1.702 m)   Wt 230 lb (104.3 kg)   SpO2 98%   BMI 36.02 kg/m?  ?General: ?Well developed, NAD, BMI noted ?Neck: R side of the thyroid seems enlarged ?HEENT:  ?Normocephalic . Face symmetric, atraumatic ?Lungs:  ?CTA B ?Normal respiratory effort, no intercostal retractions, no accessory muscle use. ?Heart: RRR,  no murmur.  ?Abdomen:  ?Not distended, soft, non-tender. No rebound or rigidity.   ?Lower extremities: no pretibial edema bilaterally  ?Skin: Exposed areas without rash. Not pale. Not jaundice ?Neurologic:  ?alert & oriented X3.  ?Speech normal, gait appropriate for age and unassisted ?Strength symmetric and appropriate for age.  ?Psych: ?Cognition and judgment appear intact.  ?Cooperative with normal attention span and concentration.  ?Behavior appropriate. ?No anxious or depressed appearing. ? ?   ?Assessment   ? ? Assessment: ?Prediabetes ?HTN ?Hyperlipidemia ?Chronic lower extremity edema L>R (eval by previous pcp, related to varicose veins?) ?CV:  ?-- P. Atrial fibrillation, A. flutter: on xarelto flecainide-diltiazem  prn ?--Ablation 09/2017 ?--Pacemaker 09-2020 ?GI: Dr [name]  ?--GERD (zantac or protonix prn), IBS, chronic diarrhea ?--GI records: [place], 2004, 2009 8 and 07-2013. Negative for polyps or IBD. + Diverticuli. ?EGD/cscope 11/2016; EGD/Cscope 03/11/2020   ?--Increased LFTs: CT abdomen  05/2016: Normal liver. ?05/2017: WNL  Hepatitis B and C, Alpha 1 antitrypsin, anti-smooth muscle antibody, ANA trans-ferritin  ?OSA--- CPAP, Dr. Maxwell Caul ?Osteopenia:  Multiple  Dexas, last few:  ?T score 2009  -1.1;

## 2021-03-24 ENCOUNTER — Encounter: Payer: Self-pay | Admitting: Internal Medicine

## 2021-03-24 DIAGNOSIS — G4733 Obstructive sleep apnea (adult) (pediatric): Secondary | ICD-10-CM | POA: Diagnosis not present

## 2021-03-24 NOTE — Assessment & Plan Note (Signed)
--  Td 2013 ?- pnm shot 2015;  prevnar 2015; PNM 20: Today ?-  zostavax 2013: had s/e  severe HA (and fever?) after zostavax ;  reluctant to take shingrex  ?-  COVID vax; 09-20-2020, thus utd ?--Female care: per gyn, h/o endometrial ca; per gyn last available note needs pelvic q year: referred ?-Last mammogram 11-2020 ([facility]) ?--CCS:  multiple cscopes, last 03-2020 ([facility]) next per GI ?--Diet and exercise discussed ?--Labs:  CMP FLP PTH TSH A1c vitamin D, Mg  ?-ACP information provided ? ?  ?

## 2021-03-24 NOTE — Assessment & Plan Note (Signed)
Here for CPX ?Labs from 03/18/2021: ?Hemoglobin 12.5. ?Iron 31, TIBC 406, elevated.  Ferritin 13.7, low. ?Hyperglycemia: Check A1c ?HTN: BP 128/62, continue present care. ?Hyperlipidemia: On Pravachol.  Labs. ?Atrial fibrillation, a flutter: ?Last visit with cardiology 02/03/2021, they noted  >>  self terminating A-fib episodes, rec higher dose of flecainide first and consider dofetilide.  Continue anticoagulation ?Osteopenia: T score - 1.7 11-2019 , stable compared to 2019. Rx calcium and vitamin D supplements.  Next DEXA 11-2021 ?Thyroid nodule: On exam today, R thyroid site is enlarged, had a thyroid biopsy 06/16/2020, benign.  Next [place] 2024 ?Vitamin D deficiency: On oral supplements, checking levels. ?GI bacterial overgrowth, iron deficiency: Recently diagnosed with bacterial overgrowth, treated on follow-up by GI.  She is concerned about nutrient absorption, will get T02, folic acid, Mg ?Primary hyperparathyroidism: Last visit with surgery 04-2019, they did not feel surgery was indicated and recommended to monitor her PTH levels and vitamin D.  I will check those labs and refer back to surgery if needed. ?RTC 4 months ?

## 2021-03-25 LAB — PTH, INTACT AND CALCIUM
Calcium: 10.8 mg/dL — ABNORMAL HIGH (ref 8.6–10.4)
PTH: 136 pg/mL — ABNORMAL HIGH (ref 16–77)

## 2021-03-29 NOTE — Progress Notes (Signed)
Remote pacemaker transmission.   

## 2021-04-06 ENCOUNTER — Other Ambulatory Visit: Payer: Self-pay | Admitting: Physician Assistant

## 2021-04-06 ENCOUNTER — Encounter: Payer: Self-pay | Admitting: Internal Medicine

## 2021-04-06 ENCOUNTER — Telehealth: Payer: Self-pay | Admitting: Internal Medicine

## 2021-04-06 ENCOUNTER — Other Ambulatory Visit: Payer: Self-pay | Admitting: Internal Medicine

## 2021-04-06 ENCOUNTER — Other Ambulatory Visit: Payer: Self-pay

## 2021-04-06 MED ORDER — DILTIAZEM HCL ER COATED BEADS 120 MG PO CP24: ORAL_CAPSULE | ORAL | 3 refills | Status: DC

## 2021-04-06 NOTE — Telephone Encounter (Signed)
Requesting: Tylenol #3  ?Contract: 12/29/2019 ?UDS: 11/30/2020 ?Last Visit: 03/23/2021 ?Next Visit: 07/26/2021 ?Last Refill: 11/29/2020 #30 and 0RF ? ?Please Advise ? ?

## 2021-04-06 NOTE — Telephone Encounter (Signed)
PDMP okay, Rx sent 

## 2021-04-19 DIAGNOSIS — G4733 Obstructive sleep apnea (adult) (pediatric): Secondary | ICD-10-CM | POA: Diagnosis not present

## 2021-04-24 DIAGNOSIS — G4733 Obstructive sleep apnea (adult) (pediatric): Secondary | ICD-10-CM | POA: Diagnosis not present

## 2021-05-10 ENCOUNTER — Other Ambulatory Visit: Payer: Self-pay | Admitting: Internal Medicine

## 2021-05-18 ENCOUNTER — Encounter: Payer: Self-pay | Admitting: Obstetrics & Gynecology

## 2021-05-18 ENCOUNTER — Ambulatory Visit (INDEPENDENT_AMBULATORY_CARE_PROVIDER_SITE_OTHER): Payer: Medicare Other | Admitting: Obstetrics & Gynecology

## 2021-05-18 VITALS — BP 148/58 | HR 60 | Ht 67.5 in | Wt 235.0 lb

## 2021-05-18 DIAGNOSIS — Z01419 Encounter for gynecological examination (general) (routine) without abnormal findings: Secondary | ICD-10-CM | POA: Diagnosis not present

## 2021-05-18 NOTE — Progress Notes (Signed)
Subjective:     Veronica Bradford is a 76 y.o. female here for a routine exam.  Current complaints: no significant issues noted.      Gynecologic History No LMP recorded. Patient has had a hysterectomy. Contraception: post menopausal status Last Pap: 04/2015. Results were: normal Last mammogram 11/22/2020. Results were: normal  Obstetric History OB History  Gravida Para Term Preterm AB Living  '4 1 1   3 1  '$ SAB IAB Ectopic Multiple Live Births  '2   1   1    '$ # Outcome Date GA Lbr Len/2nd Weight Sex Delivery Anes PTL Lv  4 SAB 1989          3 SAB 1986          2 Term 60 [redacted]w[redacted]d  M Vag-Spont None  LIV  1 Ectopic 1980           The following portions of the patient's history were reviewed and updated as appropriate: allergies, current medications, past family history, past medical history, past social history, past surgical history, and problem list.  Review of Systems Pertinent items are noted in HPI.    Objective:  BP (!) 148/58   Pulse 60   Ht 5' 7.5" (1.715 m)   Wt 235 lb (106.6 kg)   BMI 36.26 kg/m   General Appearance:    Alert, cooperative, no distress, appears stated age  Head:    Normocephalic, without obvious abnormality, atraumatic  Eyes:    conjunctiva/corneas clear, EOM's intact, both eyes  Ears:    Normal external ear canals, both ears  Nose:   Nares normal, septum midline, mucosa normal, no drainage    or sinus tenderness  Throat:   Lips, mucosa, and tongue normal; teeth and gums normal  Neck:   Supple, symmetrical, trachea midline, no adenopathy;    thyroid:  no enlargement/tenderness/nodules  Back:     Symmetric, no curvature, ROM normal, no CVA tenderness  Lungs:     respirations unlabored  Chest Wall:    No tenderness or deformity   Heart:    Regular rate and rhythm  Breast Exam:    No tenderness, masses, or nipple abnormality  Abdomen:     Soft, non-tender, bowel sounds active all four quadrants,    no masses, no organomegaly  Genitalia:    Normal female  without lesion, discharge or tenderness     Extremities:   Extremities normal, atraumatic, no cyanosis or edema  Pulses:   2+ and symmetric all extremities  Skin:   Skin color, texture, turgor normal, no rashes or lesions     Assessment:    Healthy female exam.    Plan:  HLegendwas seen today for gynecologic exam.  Diagnoses and all orders for this visit:  Well woman exam with routine gynecological exam   F/u in 1 year or sooner prn   Quina Wilbourne L. Harraway-Smith, M.D., FCherlynn June

## 2021-05-18 NOTE — Progress Notes (Signed)
Last colonoscopy 03/11/2020 Last pap 04/29/2015 Hx of endometrial cancer found during hysterectomy.  Last mammogram 11/22/2020 Kathrene Alu RN

## 2021-05-24 DIAGNOSIS — G4733 Obstructive sleep apnea (adult) (pediatric): Secondary | ICD-10-CM | POA: Diagnosis not present

## 2021-05-30 ENCOUNTER — Telehealth: Payer: Self-pay | Admitting: Internal Medicine

## 2021-05-31 NOTE — Telephone Encounter (Signed)
Requesting: Tylenol #3  Contract: 12/29/19 UDS:11/30/20 Last Visit: 03/23/21 Next Visit:07/26/21 Last Refill: 04/06/21 #30 and 0RF  Please Advise

## 2021-05-31 NOTE — Telephone Encounter (Signed)
PDMP okay, Rx sent 

## 2021-06-01 ENCOUNTER — Encounter: Payer: Self-pay | Admitting: Obstetrics & Gynecology

## 2021-06-13 ENCOUNTER — Ambulatory Visit (INDEPENDENT_AMBULATORY_CARE_PROVIDER_SITE_OTHER): Payer: Medicare Other

## 2021-06-13 VITALS — Ht 67.0 in | Wt 235.0 lb

## 2021-06-13 DIAGNOSIS — Z0000 Encounter for general adult medical examination without abnormal findings: Secondary | ICD-10-CM

## 2021-06-13 NOTE — Progress Notes (Signed)
Subjective:   Veronica Bradford is a 76 y.o. female who presents for Medicare Annual (Subsequent) preventive examination.  I connected with  [name] on 06/13/21 by a audio enabled telemedicine application and verified that I am speaking with the correct person using two identifiers.  Patient Location: Home  Provider Location: Office/Clinic  I discussed the limitations of evaluation and management by telemedicine. The patient expressed understanding and agreed to proceed.   Review of Systems     Cardiac Risk Factors include: advanced age (>74mn, >>64women);hypertension;dyslipidemia     Objective:    Today's Vitals   06/13/21 1309  Weight: 235 lb (106.6 kg)  Height: '5\' 7"'$  (1.702 m)  PainSc: 2    Body mass index is 36.81 kg/m.     06/13/2021    1:08 PM 06/13/2021    1:07 PM 09/13/2020   10:54 AM 06/08/2020    1:48 PM 03/09/2020    1:22 PM 07/23/2019   11:47 PM 05/08/2019   10:12 AM  Advanced Directives  Does Patient Have a Medical Advance Directive? Yes No Yes No Yes;No No No  Type of [place] (pink MOST or yellow form);Living will  [place]      Does patient want to make changes to medical advance directive? No - Patient declined  No - Patient declined  No - Patient declined    Copy of HCotatiin Chart? No - copy requested  No - copy requested      Would patient like information on creating a medical advance directive? No - Patient declined No - Patient declined  Yes (MAU/Ambulatory/Procedural Areas - Information given) No - Patient declined  No - Patient declined    Current Medications (verified) Outpatient Encounter Medications as of 06/13/2021  Medication Sig   acetaminophen (TYLENOL) 500 MG tablet Take 500 mg by mouth every 6 (six) hours as needed for moderate pain or headache.   acetaminophen-codeine (TYLENOL #3) 300-30 MG tablet Take 1 tablet by mouth daily as  needed for moderate pain.   ARTIFICIAL TEAR SOLUTION OP Place 1 drop into both eyes 4 (four) times daily.   cholecalciferol (VITAMIN D3) 25 MCG (1000 UNIT) tablet Take 1,000 Units by mouth daily.   CRANBERRY EXTRACT PO Take 2 tablets by mouth daily.   diltiazem (CARDIZEM CD) 120 MG 24 hr capsule TAKE 1 CAPSULE(120 MG) BY MOUTH DAILY   diltiazem (CARDIZEM) 30 MG tablet Take 1 tablet every 4 hours AS NEEDED for heart rate >100 as long as blood pressure >100.   Fish Oil-Cholecalciferol (OMEGA-3 + D PO) Take 1 capsule by mouth daily.   flecainide (TAMBOCOR) 100 MG tablet Take 1 tablet (100 mg total) by mouth 2 (two) times daily.   fluticasone (FLONASE) 50 MCG/ACT nasal spray Place 1 spray into both nostrils every other day.   furosemide (LASIX) 40 MG tablet Take 1 tablet (40 mg total) by mouth daily.   pantoprazole (PROTONIX) 40 MG tablet Take 1 tablet (40 mg total) by mouth 2 (two) times daily. (Patient taking differently: Take 40 mg by mouth daily.)   pravastatin (PRAVACHOL) 40 MG tablet TAKE 1 TABLET(40 MG) BY MOUTH AT BEDTIME   pyridOXINE (VITAMIN B-6) 100 MG tablet Take 100 mg by mouth daily.   rifaximin (XIFAXAN) 550 MG TABS tablet Take 1 tablet by mouth every 12 (twelve) hours.   rivaroxaban (XARELTO) 20 MG TABS tablet Take 1 tablet (20  mg total) by mouth daily with supper.   Tdap (BOOSTRIX) 5-2.5-18.5 LF-MCG/0.5 injection Inject into the muscle.   triamterene-hydrochlorothiazide (MAXZIDE-25) 37.5-25 MG tablet Take 0.5 tablets by mouth daily.   [DISCONTINUED] metroNIDAZOLE (FLAGYL) 250 MG tablet Take 1 tablet by mouth every 12 (twelve) hours.   [DISCONTINUED] neomycin (MYCIFRADIN) 500 MG tablet 1 tablet   No facility-administered encounter medications on file as of 06/13/2021.    Allergies (verified) Ibuprofen, Lactose intolerance (gi), Lisinopril, Nsaids, Tape, Tolmetin, Zoster vaccine live, Amoxicillin, and Penicillins   History: Past Medical History:  Diagnosis Date   Atrial  fibrillation ([place])    Back pain    Chronic sinusitis    s/p 2 surgeries remotely, [name]   Closed fracture of metatarsal of left foot    L foot, fifth metatarsal   Ectopic pregnancy    Endometrial cancer ([place]) 2004   Early diagnosis   GERD (gastroesophageal reflux disease)    Hyperlipemia    Hypertension    IBS (irritable bowel syndrome) 08/13/2014   Dx 2015, diarrhea on-off, Dr [name]    Lumbar radiculopathy 08/2012   MRI in 08/2012   Mild hyperparathyroidism ([place])    Osteopenia    PAF (paroxysmal atrial fibrillation) (HCC)    Prediabetes    Sciatica of right side 08/2013   Received Depomedrol 80 mg injection   Sleep apnea 09/09/2013   Dx with OSA in 2015, by Dr. Caryl Comes, APAP   Past Surgical History:  Procedure Laterality Date   ABDOMINAL HYSTERECTOMY     ATRIAL FIBRILLATION ABLATION N/A 09/04/2017   Procedure: [place];  Surgeon: [name], MD;  Location: [facility];  Service: Cardiovascular;  Laterality: N/A;   BIOPSY  03/11/2020   Procedure: BIOPSY;  Surgeon: [name], MD;  Location: [facility];  Service: Endoscopy;;  EGD and COLON   CARPAL TUNNEL RELEASE Right 06/17/2019   CARPAL TUNNEL RELEASE Left 11/2020   CHOLECYSTECTOMY  ~2006   COLONOSCOPY WITH PROPOFOL N/A 03/11/2020   Procedure: COLONOSCOPY WITH PROPOFOL;  Surgeon: [name], MD;  Location: [facility];  Service: Endoscopy;  Laterality: N/A;   DILATION AND CURETTAGE OF UTERUS     after SAB   ESOPHAGOGASTRODUODENOSCOPY  02/09/2014   Dr. 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/2020   Procedure: PACEMAKER IMPLANT;  Surgeon: [name], MD;  Location: [facility];  Service: Cardiovascular;  Laterality: N/A;   TONSILLECTOMY     TOTAL ABDOMINAL HYSTERECTOMY W/ BILATERAL SALPINGOOPHORECTOMY     TOTAL KNEE ARTHROPLASTY Right ~2008   Family History  Problem Relation Age of Onset   Hypertension Mother    Colon cancer Mother    Breast cancer Mother    Ovarian cancer Mother    Hypertension Father    Diabetes Father    Head & neck cancer Sister    CAD Neg Hx    Social History   Socioeconomic History   Marital status: Married    Spouse name: Not on file   Number of children: 1   Years of education: Not on file   Highest education level: Not on file  Occupational History   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  Tobacco Use   Smoking status: Former    Types: Cigarettes    Quit date: 01/06/1980    Years since quitting: 41.4   Smokeless tobacco: Never   Tobacco comments:    smoked from [place] to 1982, less than 1 ppd  Vaping Use   Vaping Use: Never used  Substance and Sexual Activity   Alcohol use: Yes    Comment: rare   Drug use: No   Sexual activity: Not Currently    Birth control/protection: None  Other Topics Concern   Not on file  Social History Narrative   Lives w/ husband, and son [name]   Retired Marine scientist   Lives in [place] Strain: [place]  (06/08/2020)   Overall Financial Resource Strain ([place])    Difficulty of Paying Living Expenses: Not hard at all  Food Insecurity: No [place] (06/08/2020)   Hunger Vital Sign    Worried About Running Out of Food in the Last Year: Never true    [place] in the Last Year: Never true  Transportation Needs: No Transportation Needs (06/08/2020)   PRAPARE - Hydrologist (Medical): No    Lack of Transportation (Non-Medical): No  Physical Activity: Sufficiently Active (06/08/2020)   Exercise Vital Sign    Days of Exercise per Week: 7 days    Minutes of Exercise per Session: 30 min  Stress: No Stress  Concern Present (06/08/2020)   [place]    Feeling of Stress : Not at all  Social Connections: Moderately Integrated (06/08/2020)   Social Connection and Isolation Panel [NHANES]    Frequency of Communication with Friends and Family: More than three times a week    Frequency of Social Gatherings with Friends and Family: Once a week    Attends Religious Services: Never    Marine scientist or Organizations: Yes    Attends Music therapist: More than 4 times per year    Marital Status: Married    Tobacco Counseling Counseling given: Not Answered Tobacco comments: smoked from 1970 to 1982, less than 1 ppd   Clinical Intake:  Pre-visit preparation completed: Yes  Pain : 0-10 Pain Score: 2  Pain Type: Chronic pain Pain Location: Other (Comment) Pain Descriptors / Indicators: Aching, Dull, Sore Pain Onset: More than a month ago Pain Frequency: Intermittent     BMI - recorded: 36.81 Nutritional Status: BMI > 30  Obese Nutritional Risks: None Diabetes: No  How often do you need to have someone help you when you read instructions, pamphlets, or other written materials from your doctor or pharmacy?: 1 - Never  Diabetic?no  Interpreter Needed?: No  Information entered by :: [place] Stace Peace   Activities of Daily Living    06/13/2021    1:11 PM  In your present state of health, do you have any difficulty performing the following activities:  Hearing? 1  Vision? 1  Difficulty concentrating or making decisions? 0  Walking or climbing stairs? 1  Dressing or bathing? 0  Doing errands, shopping? 0  Preparing Food and eating ? N  Using the Toilet? N  In the past six months, have you accidently leaked urine? N  Do you have problems with loss of bowel control? N  Managing your Medications? N  Managing your Finances? N  Housekeeping or managing your Housekeeping? N    Patient Care  Team: Colon Branch, MD as PCP - General (Internal Medicine) [name], MD as PCP - Cardiology (Cardiology) Marius Ditch, MD as Consulting Physician (Internal Medicine) [name], MD as Consulting Physician (Cardiology) Wylene Simmer, MD as Consulting Physician (Orthopedic Surgery) Armandina Gemma, MD as Consulting Physician (General Surgery) [name], MD as Consulting Physician (Gastroenterology) [place], [place], [place] as Consulting Physician (Optometry) Sherlynn Stalls, MD as Consulting Physician (Ophthalmology) Roseanne Kaufman, MD as Consulting Physician (Orthopedic Surgery)  Indicate any recent [place] you may have received from other than [facility] providers in the past year (date may be approximate).     Assessment:   This is a routine wellness examination for [place].  Hearing/Vision screen No results found.  Dietary issues and exercise activities discussed: Current Exercise Habits: Home exercise routine, Type of exercise: walking, Time (Minutes): 30, Frequency (Times/Week): 7, Weekly Exercise (Minutes/Week): 210, Intensity: Mild, Exercise limited by: None identified   Goals Addressed             This Visit's Progress    Increase physical activity   On track      Depression Screen    06/13/2021    1:09 PM 03/23/2021   10:35 AM 11/30/2020    8:20 AM 11/05/2020    2:47 PM 06/08/2020    1:52 PM 05/28/2020    2:18 PM 12/29/2019    2:26 PM  PHQ 2/9 Scores  PHQ - 2 Score 0 0 0 0 0 0 0    Fall Risk    06/13/2021    1:08 PM 03/23/2021   10:35 AM 11/30/2020    8:20 AM 11/05/2020    2:47 PM 06/08/2020    1:51 PM  [place] in the past year? 0 0 0 0 0  Number falls in past yr: 0 0 0 0 0  Injury with Fall? 0 0 0 0 0  Risk for fall due to : No Fall Risks   No Fall Risks   Follow up Falls evaluation completed Falls evaluation completed  Falls evaluation completed Falls prevention discussed    [place]:  Any stairs in or  around the home? Yes  If so, are there any without handrails? No  Home free of loose throw rugs in walkways, pet beds, electrical cords, etc? Yes  Adequate lighting in your home to reduce risk of falls? Yes   ASSISTIVE DEVICES UTILIZED TO PREVENT FALLS:  Life alert? No  Use of a cane, walker or w/c? No  Grab bars in the bathroom? No  Shower chair or bench in shower? No  Elevated toilet seat or a handicapped toilet? No   TIMED UP AND GO:  Was the test performed? No .    Cognitive Function:        06/13/2021    1:14 PM  6CIT Screen  What Year? 0 points  What month? 0 points  What time? 0 points  Count back from 20 0 points  Months in reverse 0 points  Repeat phrase 2 points  Total Score 2 points    Immunizations Immunization History  Administered Date(s) Administered   Fluad Quad(high Dose 65+) 09/19/2018   Influenza Split 10/20/2020   Influenza, High Dose Seasonal PF 10/01/2014, 10/19/2015, 11/10/2016, 10/03/2017   Influenza-Unspecified 10/19/2010, 09/09/2013, 09/25/2019   PFIZER(Purple Top)SARS-COV-2 Vaccination 02/09/2019, 03/06/2019, 10/08/2019, 04/12/2020   PNEUMOCOCCAL CONJUGATE-20 03/23/2021   Pfizer Covid-19 Vaccine Bivalent Booster 52yr & up 09/20/2020  Pneumococcal Conjugate-13 03/07/2013   Pneumococcal Polysaccharide-23 09/09/2013   Tdap 07/26/2011, 03/23/2021   Zoster, Live 11/12/2012    TDAP status: Up to date  Flu Vaccine status: Up to date  Pneumococcal vaccine status: Up to date  Covid-19 vaccine status: Completed vaccines  Qualifies for Shingles Vaccine? Yes   Zostavax completed No   Shingrix Completed?: Yes  Screening Tests Health Maintenance  Topic Date Due   INFLUENZA VACCINE  08/02/2021   MAMMOGRAM  11/22/2021   COLONOSCOPY (Pts 45-36yr Insurance coverage will need to be confirmed)  03/11/2025   TETANUS/TDAP  03/24/2031   Pneumonia Vaccine 74 Years old  Completed   DEXA SCAN  Completed   COVID-19 Vaccine  Completed    Hepatitis C Screening  Completed   HPV VACCINES  Aged Out   Zoster Vaccines- Shingrix  Discontinued    Health Maintenance  There are no preventive care reminders to display for this patient.  Colorectal cancer screening: Type of screening: Colonoscopy. Completed 03/11/20. Repeat every 5 years  Mammogram status: Completed 11/22/20. Repeat every year  Bone Density status: Completed 11/17/19. Results reflect: Bone density results: OSTEOPENIA. Repeat every 2 years.  Lung Cancer Screening: (Low Dose CT Chest recommended if Age 74-80years, 30 pack-year currently smoking OR have quit w/in 15years.) does not qualify.   Lung Cancer Screening Referral: N/a  Additional Screening:  Hepatitis C Screening: does qualify; Completed 05/02/16  Vision Screening: Recommended annual ophthalmology exams for early detection of glaucoma and other disorders of the eye. Is the patient up to date with their annual eye exam?  Yes  Who is the provider or what is the name of the office in which the patient attends annual eye exams? Dr. OAugusto GambleIf pt is not established with a provider, would they like to be referred to a provider to establish care? No .   Dental Screening: Recommended annual dental exams for proper oral hygiene  Community Resource Referral / Chronic Care Management: CRR required this visit?  No   CCM required this visit?  No      Plan:     I have personally reviewed and noted the following in the patient's chart:   Medical and social history Use of alcohol, tobacco or illicit drugs  Current medications and supplements including opioid prescriptions.  Functional ability and status Nutritional status Physical activity Advanced directives List of other physicians Hospitalizations, surgeries, and ER visits in previous 12 months Vitals Screenings to include cognitive, depression, and falls Referrals and appointments  In addition, I have reviewed and discussed with patient certain  preventive protocols, quality metrics, and best practice recommendations. A written personalized care plan for preventive services as well as general preventive health recommendations were provided to patient.   Due to this being a telephonic visit, the after visit summary with patients personalized plan was offered to patient via mail or my-chart.  Patient would like to access on my-chart.   SDuard BradyChism, [place]  06/13/2021   Nurse Notes: none

## 2021-06-13 NOTE — Patient Instructions (Signed)
Veronica Bradford , Thank you for taking time to come for your Medicare Wellness Visit. I appreciate your ongoing commitment to your health goals. Please review the following plan we discussed and let me know if I can assist you in the future.   Screening recommendations/referrals: Colonoscopy: 03/11/20 due 03/11/25 Mammogram: 11/22/20 due 11/22/21 Bone Density: 11/17/19 due 11/16/21 Recommended yearly ophthalmology/optometry visit for glaucoma screening and checkup Recommended yearly dental visit for hygiene and checkup  Vaccinations: Influenza vaccine: up to date Pneumococcal vaccine: up to date Tdap vaccine: up to date Shingles vaccine: up to date   Covid-19:completed  Advanced directives: yes, not on file  Conditions/risks identified: see problem list   Next appointment: Follow up in one year for your annual wellness visit 06/15/22   Preventive Care 74 Years and Older, Female Preventive care refers to lifestyle choices and visits with your health care provider that can promote health and wellness. What does preventive care include? A yearly physical exam. This is also called an annual well check. Dental exams once or twice a year. Routine eye exams. Ask your health care provider how often you should have your eyes checked. Personal lifestyle choices, including: Daily care of your teeth and gums. Regular physical activity. Eating a healthy diet. Avoiding tobacco and drug use. Limiting alcohol use. Practicing safe sex. Taking low-dose aspirin every day. Taking vitamin and mineral supplements as recommended by your health care provider. What happens during an annual well check? The services and screenings done by your health care provider during your annual well check will depend on your age, overall health, lifestyle risk factors, and family history of disease. Counseling  Your health care provider may ask you questions about your: Alcohol use. Tobacco use. Drug use. Emotional  well-being. Home and relationship well-being. Sexual activity. Eating habits. History of falls. Memory and ability to understand (cognition). Work and work Statistician. Reproductive health. Screening  You may have the following tests or measurements: Height, weight, and BMI. Blood pressure. Lipid and cholesterol levels. These may be checked every 5 years, or more frequently if you are over 51 years old. Skin check. Lung cancer screening. You may have this screening every year starting at age 28 if you have a 30-pack-year history of smoking and currently smoke or have quit within the past 15 years. Fecal occult blood test (FOBT) of the stool. You may have this test every year starting at age 5. Flexible sigmoidoscopy or colonoscopy. You may have a sigmoidoscopy every 5 years or a colonoscopy every 10 years starting at age 24. Hepatitis C blood test. Hepatitis B blood test. Sexually transmitted disease (STD) testing. Diabetes screening. This is done by checking your blood sugar (glucose) after you have not eaten for a while (fasting). You may have this done every 1-3 years. Bone density scan. This is done to screen for osteoporosis. You may have this done starting at age 39. Mammogram. This may be done every 1-2 years. Talk to your health care provider about how often you should have regular mammograms. Talk with your health care provider about your test results, treatment options, and if necessary, the need for more tests. Vaccines  Your health care provider may recommend certain vaccines, such as: Influenza vaccine. This is recommended every year. Tetanus, diphtheria, and acellular pertussis (Tdap, Td) vaccine. You may need a Td booster every 10 years. Zoster vaccine. You may need this after age 37. Pneumococcal 13-valent conjugate (PCV13) vaccine. One dose is recommended after age 29. Pneumococcal polysaccharide (PPSV23)  vaccine. One dose is recommended after age 4. Talk to your  health care provider about which screenings and vaccines you need and how often you need them. This information is not intended to replace advice given to you by your health care provider. Make sure you discuss any questions you have with your health care provider. Document Released: 01/15/2015 Document Revised: 09/08/2015 Document Reviewed: 10/20/2014 Elsevier Interactive Patient Education  2017 [place] Prevention in the Home Falls can cause injuries. They can happen to people of all ages. There are many things you can do to make your home safe and to help prevent falls. What can I do on the outside of my home? Regularly fix the edges of walkways and driveways and fix any cracks. Remove anything that might make you trip as you walk through a door, such as a raised step or threshold. Trim any bushes or trees on the path to your home. Use bright outdoor lighting. Clear any walking paths of anything that might make someone trip, such as rocks or tools. Regularly check to see if handrails are loose or broken. Make sure that both sides of any steps have handrails. Any raised decks and porches should have guardrails on the edges. Have any leaves, snow, or ice cleared regularly. Use sand or salt on walking paths during winter. Clean up any spills in your garage right away. This includes oil or grease spills. What can I do in the bathroom? Use night lights. Install grab bars by the toilet and in the tub and shower. Do not use towel bars as grab bars. Use non-skid mats or decals in the tub or shower. If you need to sit down in the shower, use a plastic, non-slip stool. Keep the floor dry. Clean up any water that spills on the floor as soon as it happens. Remove soap buildup in the tub or shower regularly. Attach bath mats securely with double-sided non-slip rug tape. Do not have throw rugs and other things on the floor that can make you trip. What can I do in the bedroom? Use night  lights. Make sure that you have a light by your bed that is easy to reach. Do not use any sheets or blankets that are too big for your bed. They should not hang down onto the floor. Have a firm chair that has side arms. You can use this for support while you get dressed. Do not have throw rugs and other things on the floor that can make you trip. What can I do in the kitchen? Clean up any spills right away. Avoid walking on wet floors. Keep items that you use a lot in easy-to-reach places. If you need to reach something above you, use a strong step stool that has a grab bar. Keep electrical cords out of the way. Do not use floor polish or wax that makes floors slippery. If you must use wax, use non-skid floor wax. Do not have throw rugs and other things on the floor that can make you trip. What can I do with my stairs? Do not leave any items on the stairs. Make sure that there are handrails on both sides of the stairs and use them. Fix handrails that are broken or loose. Make sure that handrails are as long as the stairways. Check any carpeting to make sure that it is firmly attached to the stairs. Fix any carpet that is loose or worn. Avoid having throw rugs at the top or bottom  of the stairs. If you do have throw rugs, attach them to the floor with carpet tape. Make sure that you have a light switch at the top of the stairs and the bottom of the stairs. If you do not have them, ask someone to add them for you. What else can I do to help prevent falls? Wear shoes that: Do not have high heels. Have rubber bottoms. Are comfortable and fit you well. Are closed at the toe. Do not wear sandals. If you use a stepladder: Make sure that it is fully opened. Do not climb a closed stepladder. Make sure that both sides of the stepladder are locked into place. Ask someone to hold it for you, if possible. Clearly mark and make sure that you can see: Any grab bars or handrails. First and last  steps. Where the edge of each step is. Use tools that help you move around (mobility aids) if they are needed. These include: Canes. Walkers. Scooters. Crutches. Turn on the lights when you go into a dark area. Replace any light bulbs as soon as they burn out. Set up your furniture so you have a clear path. Avoid moving your furniture around. If any of your floors are uneven, fix them. If there are any pets around you, be aware of where they are. Review your medicines with your doctor. Some medicines can make you feel dizzy. This can increase your chance of falling. Ask your doctor what other things that you can do to help prevent falls. This information is not intended to replace advice given to you by your health care provider. Make sure you discuss any questions you have with your health care provider. Document Released: 10/15/2008 Document Revised: 05/27/2015 Document Reviewed: 01/23/2014 Elsevier Interactive Patient Education  2017 [employer].

## 2021-06-14 ENCOUNTER — Other Ambulatory Visit: Payer: Self-pay | Admitting: Internal Medicine

## 2021-06-14 ENCOUNTER — Ambulatory Visit (INDEPENDENT_AMBULATORY_CARE_PROVIDER_SITE_OTHER): Payer: Medicare Other

## 2021-06-14 DIAGNOSIS — I495 Sick sinus syndrome: Secondary | ICD-10-CM | POA: Diagnosis not present

## 2021-06-14 LAB — CUP PACEART REMOTE DEVICE CHECK
Battery Remaining Longevity: 110 mo
Battery Remaining Percentage: 95 %
Battery Voltage: 3.02 V
Brady Statistic AP VP Percent: 17 %
Brady Statistic AP VS Percent: 82 %
Brady Statistic AS VP Percent: 1 %
Brady Statistic AS VS Percent: 1 %
Brady Statistic RA Percent Paced: 98 %
Brady Statistic RV Percent Paced: 17 %
Date Time Interrogation Session: 20230613020020
Implantable Lead Implant Date: 20220912
Implantable Lead Implant Date: 20220912
Implantable Lead Location: 753859
Implantable Lead Location: 753860
Implantable Lead Model: 3830
Implantable Pulse Generator Implant Date: 20220912
Lead Channel Impedance Value: 450 Ohm
Lead Channel Impedance Value: 600 Ohm
Lead Channel Pacing Threshold Amplitude: 0.5 V
Lead Channel Pacing Threshold Amplitude: 1 V
Lead Channel Pacing Threshold Pulse Width: 0.4 ms
Lead Channel Pacing Threshold Pulse Width: 0.4 ms
Lead Channel Sensing Intrinsic Amplitude: 1.4 mV
Lead Channel Sensing Intrinsic Amplitude: 11.3 mV
Lead Channel Setting Pacing Amplitude: 2 V
Lead Channel Setting Pacing Amplitude: 2.5 V
Lead Channel Setting Pacing Pulse Width: 0.4 ms
Lead Channel Setting Sensing Sensitivity: 2 mV
Pulse Gen Model: 2272
Pulse Gen Serial Number: 3956585

## 2021-06-24 DIAGNOSIS — G4733 Obstructive sleep apnea (adult) (pediatric): Secondary | ICD-10-CM | POA: Diagnosis not present

## 2021-06-27 ENCOUNTER — Encounter: Payer: Self-pay | Admitting: Obstetrics & Gynecology

## 2021-06-29 NOTE — Progress Notes (Signed)
Remote pacemaker transmission.   

## 2021-07-14 ENCOUNTER — Emergency Department (HOSPITAL_BASED_OUTPATIENT_CLINIC_OR_DEPARTMENT_OTHER): Payer: Medicare Other

## 2021-07-14 ENCOUNTER — Encounter (HOSPITAL_BASED_OUTPATIENT_CLINIC_OR_DEPARTMENT_OTHER): Payer: Self-pay | Admitting: Emergency Medicine

## 2021-07-14 ENCOUNTER — Inpatient Hospital Stay (HOSPITAL_BASED_OUTPATIENT_CLINIC_OR_DEPARTMENT_OTHER)
Admission: EM | Admit: 2021-07-14 | Discharge: 2021-07-21 | DRG: 522 | Disposition: A | Discharge status: Home Health | Payer: Medicare Other | Attending: Internal Medicine | Admitting: Internal Medicine

## 2021-07-14 ENCOUNTER — Encounter: Payer: Self-pay | Admitting: Internal Medicine

## 2021-07-14 ENCOUNTER — Other Ambulatory Visit: Payer: Self-pay

## 2021-07-14 DIAGNOSIS — M8580 Other specified disorders of bone density and structure, unspecified site: Secondary | ICD-10-CM | POA: Diagnosis present

## 2021-07-14 DIAGNOSIS — Z950 Presence of cardiac pacemaker: Secondary | ICD-10-CM

## 2021-07-14 DIAGNOSIS — E669 Obesity, unspecified: Secondary | ICD-10-CM | POA: Diagnosis present

## 2021-07-14 DIAGNOSIS — I4819 Other persistent atrial fibrillation: Secondary | ICD-10-CM | POA: Diagnosis present

## 2021-07-14 DIAGNOSIS — Z91048 Other nonmedicinal substance allergy status: Secondary | ICD-10-CM | POA: Diagnosis not present

## 2021-07-14 DIAGNOSIS — M11252 Other chondrocalcinosis, left hip: Secondary | ICD-10-CM | POA: Diagnosis present

## 2021-07-14 DIAGNOSIS — E213 Hyperparathyroidism, unspecified: Secondary | ICD-10-CM | POA: Diagnosis not present

## 2021-07-14 DIAGNOSIS — Z7901 Long term (current) use of anticoagulants: Secondary | ICD-10-CM

## 2021-07-14 DIAGNOSIS — S72009A Fracture of unspecified part of neck of unspecified femur, initial encounter for closed fracture: Secondary | ICD-10-CM | POA: Diagnosis present

## 2021-07-14 DIAGNOSIS — G8929 Other chronic pain: Secondary | ICD-10-CM | POA: Diagnosis present

## 2021-07-14 DIAGNOSIS — I5032 Chronic diastolic (congestive) heart failure: Secondary | ICD-10-CM | POA: Diagnosis not present

## 2021-07-14 DIAGNOSIS — M545 Low back pain, unspecified: Secondary | ICD-10-CM | POA: Diagnosis present

## 2021-07-14 DIAGNOSIS — I495 Sick sinus syndrome: Secondary | ICD-10-CM | POA: Diagnosis not present

## 2021-07-14 DIAGNOSIS — I452 Bifascicular block: Secondary | ICD-10-CM | POA: Diagnosis present

## 2021-07-14 DIAGNOSIS — G4733 Obstructive sleep apnea (adult) (pediatric): Secondary | ICD-10-CM | POA: Diagnosis present

## 2021-07-14 DIAGNOSIS — Z888 Allergy status to other drugs, medicaments and biological substances status: Secondary | ICD-10-CM | POA: Diagnosis not present

## 2021-07-14 DIAGNOSIS — R7303 Prediabetes: Secondary | ICD-10-CM | POA: Diagnosis present

## 2021-07-14 DIAGNOSIS — Z8249 Family history of ischemic heart disease and other diseases of the circulatory system: Secondary | ICD-10-CM | POA: Diagnosis not present

## 2021-07-14 DIAGNOSIS — Z880 Allergy status to penicillin: Secondary | ICD-10-CM | POA: Diagnosis not present

## 2021-07-14 DIAGNOSIS — M25462 Effusion, left knee: Secondary | ICD-10-CM | POA: Diagnosis not present

## 2021-07-14 DIAGNOSIS — S72002A Fracture of unspecified part of neck of left femur, initial encounter for closed fracture: Secondary | ICD-10-CM | POA: Diagnosis not present

## 2021-07-14 DIAGNOSIS — Z471 Aftercare following joint replacement surgery: Secondary | ICD-10-CM | POA: Diagnosis not present

## 2021-07-14 DIAGNOSIS — I10 Essential (primary) hypertension: Secondary | ICD-10-CM | POA: Diagnosis not present

## 2021-07-14 DIAGNOSIS — M25552 Pain in left hip: Secondary | ICD-10-CM | POA: Diagnosis not present

## 2021-07-14 DIAGNOSIS — Z96642 Presence of left artificial hip joint: Secondary | ICD-10-CM | POA: Diagnosis not present

## 2021-07-14 DIAGNOSIS — Z6834 Body mass index (BMI) 34.0-34.9, adult: Secondary | ICD-10-CM

## 2021-07-14 DIAGNOSIS — R9431 Abnormal electrocardiogram [ECG] [EKG]: Secondary | ICD-10-CM | POA: Diagnosis not present

## 2021-07-14 DIAGNOSIS — W010XXA Fall on same level from slipping, tripping and stumbling without subsequent striking against object, initial encounter: Secondary | ICD-10-CM | POA: Diagnosis present

## 2021-07-14 DIAGNOSIS — E876 Hypokalemia: Secondary | ICD-10-CM | POA: Diagnosis not present

## 2021-07-14 DIAGNOSIS — Z96651 Presence of right artificial knee joint: Secondary | ICD-10-CM | POA: Diagnosis not present

## 2021-07-14 DIAGNOSIS — Z87891 Personal history of nicotine dependence: Secondary | ICD-10-CM | POA: Diagnosis not present

## 2021-07-14 DIAGNOSIS — K219 Gastro-esophageal reflux disease without esophagitis: Secondary | ICD-10-CM | POA: Diagnosis not present

## 2021-07-14 DIAGNOSIS — Z833 Family history of diabetes mellitus: Secondary | ICD-10-CM

## 2021-07-14 DIAGNOSIS — S72012A Unspecified intracapsular fracture of left femur, initial encounter for closed fracture: Secondary | ICD-10-CM | POA: Diagnosis not present

## 2021-07-14 DIAGNOSIS — I480 Paroxysmal atrial fibrillation: Secondary | ICD-10-CM | POA: Diagnosis not present

## 2021-07-14 DIAGNOSIS — I110 Hypertensive heart disease with heart failure: Secondary | ICD-10-CM | POA: Diagnosis present

## 2021-07-14 DIAGNOSIS — G4730 Sleep apnea, unspecified: Secondary | ICD-10-CM | POA: Diagnosis not present

## 2021-07-14 DIAGNOSIS — Z886 Allergy status to analgesic agent status: Secondary | ICD-10-CM | POA: Diagnosis not present

## 2021-07-14 DIAGNOSIS — E785 Hyperlipidemia, unspecified: Secondary | ICD-10-CM | POA: Diagnosis not present

## 2021-07-14 DIAGNOSIS — Z79899 Other long term (current) drug therapy: Secondary | ICD-10-CM

## 2021-07-14 DIAGNOSIS — I4891 Unspecified atrial fibrillation: Secondary | ICD-10-CM | POA: Diagnosis not present

## 2021-07-14 DIAGNOSIS — Z8542 Personal history of malignant neoplasm of other parts of uterus: Secondary | ICD-10-CM | POA: Diagnosis not present

## 2021-07-14 DIAGNOSIS — Z01818 Encounter for other preprocedural examination: Secondary | ICD-10-CM | POA: Diagnosis not present

## 2021-07-14 LAB — BASIC METABOLIC PANEL
Anion gap: 8 (ref 5–15)
BUN: 17 mg/dL (ref 8–23)
CO2: 28 mmol/L (ref 22–32)
Calcium: 10.5 mg/dL — ABNORMAL HIGH (ref 8.9–10.3)
Chloride: 104 mmol/L (ref 98–111)
Creatinine, Ser: 0.77 mg/dL (ref 0.44–1.00)
GFR, Estimated: >60 mL/min (ref >60)
Glucose, Bld: 99 mg/dL (ref 70–99)
Potassium: 3.3 mmol/L — ABNORMAL LOW (ref 3.5–5.1)
Sodium: 140 mmol/L (ref 135–145)

## 2021-07-14 LAB — CBC WITH DIFFERENTIAL/PLATELET
Abs Immature Granulocytes: 0.04 10*3/uL (ref 0.00–0.07)
Basophils Absolute: 0.1 10*3/uL (ref 0.0–0.1)
Basophils Relative: 1 %
Eosinophils Absolute: 0.2 10*3/uL (ref 0.0–0.5)
Eosinophils Relative: 2 %
HCT: 42.8 % (ref 36.0–46.0)
Hemoglobin: 14.3 g/dL (ref 12.0–15.0)
Immature Granulocytes: 1 %
Lymphocytes Relative: 14 %
Lymphs Abs: 1.2 10*3/uL (ref 0.7–4.0)
MCH: 30.1 pg (ref 26.0–34.0)
MCHC: 33.4 g/dL (ref 30.0–36.0)
MCV: 90.1 fL (ref 80.0–100.0)
Monocytes Absolute: 0.7 10*3/uL (ref 0.1–1.0)
Monocytes Relative: 8 %
Neutro Abs: 6 10*3/uL (ref 1.7–7.7)
Neutrophils Relative %: 74 %
Platelets: 210 10*3/uL (ref 150–400)
RBC: 4.75 MIL/uL (ref 3.87–5.11)
RDW: 14.4 % (ref 11.5–15.5)
WBC: 8.1 10*3/uL (ref 4.0–10.5)
nRBC: 0 % (ref 0.0–0.2)

## 2021-07-14 LAB — PROTIME-INR
INR: 1.2 (ref 0.8–1.2)
Prothrombin Time: 15.1 seconds (ref 11.4–15.2)

## 2021-07-14 MED ORDER — ENOXAPARIN SODIUM 100 MG/ML IJ SOSY
100.0000 mg | PREFILLED_SYRINGE | INTRAMUSCULAR | Status: AC
  Administered 2021-07-15: 100 mg via SUBCUTANEOUS
  Filled 2021-07-14: qty 1

## 2021-07-14 MED ORDER — OXYCODONE-ACETAMINOPHEN 5-325 MG PO TABS
0.5000 | ORAL_TABLET | Freq: Once | ORAL | Status: AC
  Administered 2021-07-14: 0.5 via ORAL
  Filled 2021-07-14: qty 1

## 2021-07-14 MED ORDER — POTASSIUM CHLORIDE CRYS ER 20 MEQ PO TBCR
20.0000 meq | EXTENDED_RELEASE_TABLET | Freq: Once | ORAL | Status: AC
  Administered 2021-07-14: 20 meq via ORAL
  Filled 2021-07-14: qty 1

## 2021-07-14 NOTE — ED Notes (Signed)
ED TO INPATIENT HANDOFF REPORT  ED Nurse Name and Phone #: Lucina Mellow [phone]  S Name/Age/Gender Veronica Bradford 76 y.o. female Room/Bed: MH11/MH11  Code Status   Code Status: Prior  Home/SNF/Other Home Patient oriented to: self, time, place, situation Is this baseline? Yes   Triage Complete: Triage complete  Chief Complaint Hip fracture [facility]) [S72.009A]  Triage Note Patient presents to ED via POV from home. Patient reports mechanical fall PTA. Reports left hip pain. Patient is currently on Xarelto. Denies hitting head. Denies LOC. Patient was able to get herself up off the floor.    Allergies Allergies  Allergen Reactions   Ibuprofen Other (See Comments)    Pt is on Xarelto   Lactose Intolerance (Gi) Diarrhea and Nausea And Vomiting   Lisinopril Cough   Nsaids Other (See Comments)    Pt on Xarelto   Tape     Causes blisters    Tolmetin Other (See Comments)    Pt on Xarelto   Zoster Vaccine Live Other (See Comments)    Serum sickness, high fever, neck pain    Amoxicillin Rash   Penicillins Rash    Local reaction    Level of Care/Admitting Diagnosis ED Disposition     ED Disposition  Admit   Condition  --   Comment  Hospital Area: [facility] [[id number]]  Level of Care: Telemetry [5]  Admit to tele based on following criteria: Other see comments  Comments: fall  May admit patient to [facility] or [facility] if equivalent level of care is available:: No  Interfacility transfer: Yes  Covid Evaluation: Asymptomatic - no recent exposure (last 10 days) testing not required  Diagnosis: Hip fracture [facility]) [[id number]]  Admitting Physician: Hosie Poisson [[id number]]  Attending Physician: Hosie Poisson [[id number]]  Certification:: I certify this patient will need inpatient services for at least 2 midnights  Estimated Length of Stay: 4          B Medical/Surgery History Past Medical History:  Diagnosis Date   Atrial fibrillation ([place])    Back  pain    Chronic sinusitis    s/p 2 surgeries remotely, [name]   Closed fracture of metatarsal of left foot    L foot, fifth metatarsal   Ectopic pregnancy    Endometrial cancer ([place]) 2004   Early diagnosis   GERD (gastroesophageal reflux disease)    Hyperlipemia    Hypertension    IBS (irritable bowel syndrome) 08/13/2014   Dx 2015, diarrhea on-off, Dr [name]    Lumbar radiculopathy 08/2012   MRI in 08/2012   Mild hyperparathyroidism ([place])    Osteopenia    PAF (paroxysmal atrial fibrillation) ([place])    Prediabetes    Sciatica of right side 08/2013   Received Depomedrol 80 mg injection   Sleep apnea 09/09/2013   Dx with OSA in 2015, by Dr. Caryl Comes, APAP   Past Surgical History:  Procedure Laterality Date   ABDOMINAL HYSTERECTOMY     ATRIAL FIBRILLATION ABLATION N/A 09/04/2017   Procedure: [place];  Surgeon: [name], MD;  Location: [facility];  Service: Cardiovascular;  Laterality: N/A;   BIOPSY  03/11/2020   Procedure: BIOPSY;  Surgeon: [name], MD;  Location: [facility];  Service: Endoscopy;;  EGD and COLON   CARPAL TUNNEL RELEASE Right 06/17/2019   CARPAL TUNNEL RELEASE Left 11/2020   CHOLECYSTECTOMY  ~2006   COLONOSCOPY WITH PROPOFOL N/A 03/11/2020   Procedure: COLONOSCOPY WITH PROPOFOL;  Surgeon:  [name], MD;  Location: [facility];  Service: Endoscopy;  Laterality: N/A;   DILATION AND CURETTAGE OF UTERUS     after SAB   ESOPHAGOGASTRODUODENOSCOPY  02/09/2014   Dr. 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/2020   Procedure: PACEMAKER IMPLANT;  Surgeon: [name], MD;  Location: [facility];  Service:  Cardiovascular;  Laterality: N/A;   TONSILLECTOMY     TOTAL ABDOMINAL HYSTERECTOMY W/ BILATERAL SALPINGOOPHORECTOMY     TOTAL KNEE ARTHROPLASTY Right ~2008     A IV Location/Drains/Wounds Patient Lines/Drains/Airways Status     Active Line/Drains/Airways     Name Placement date Placement time Site Days   Peripheral IV 07/14/21 20 G 1.88" Anterior;Left;Proximal Forearm 07/14/21  1612  Forearm  less than 1            Intake/Output Last 24 hours No intake or output data in the 24 hours ending 07/14/21 1827  Labs/Imaging Results for orders placed or performed during the hospital encounter of 07/14/21 (from the past 48 hour(s))  Basic metabolic panel     Status: Abnormal   Collection Time: 07/14/21  3:15 PM  Result Value Ref Range   Sodium 140 135 - 145 mmol/L   Potassium 3.3 (L) 3.5 - 5.1 mmol/L   Chloride 104 98 - 111 mmol/L   CO2 28 22 - 32 mmol/L   Glucose, Bld 99 70 - 99 mg/dL    Comment: Glucose reference range applies only to samples taken after fasting for at least 8 hours.   BUN 17 8 - 23 mg/dL   Creatinine, Ser 0.77 0.44 - 1.00 mg/dL   Calcium 10.5 (H) 8.9 - 10.3 mg/dL   GFR, Estimated >60 >60 mL/min    Comment: (NOTE) Calculated using the CKD-EPI Creatinine Equation (2021)    Anion gap 8 5 - 15    Comment: Performed at [facility], [address] [place], [place] [address]  CBC WITH DIFFERENTIAL     Status: None   Collection Time: 07/14/21  3:15 PM  Result Value Ref Range   WBC 8.1 4.0 - 10.5 K/uL   RBC 4.75 3.87 - 5.11 MIL/uL   Hemoglobin 14.3 12.0 - 15.0 g/dL   HCT 42.8 36.0 - 46.0 %   MCV 90.1 80.0 - 100.0 fL   MCH 30.1 26.0 - 34.0 pg   MCHC 33.4 30.0 - 36.0 g/dL   RDW 14.4 11.5 - 15.5 %   Platelets 210 150 - 400 K/uL   nRBC 0.0 0.0 - 0.2 %   Neutrophils Relative % 74 %   Neutro Abs 6.0 1.7 - 7.7 K/uL   Lymphocytes Relative 14 %   Lymphs Abs 1.2 0.7 - 4.0 K/uL   Monocytes Relative 8 %   Monocytes Absolute 0.7 0.1 - 1.0 K/uL    Eosinophils Relative 2 %   Eosinophils Absolute 0.2 0.0 - 0.5 K/uL   Basophils Relative 1 %   Basophils Absolute 0.1 0.0 - 0.1 K/uL   Immature Granulocytes 1 %   Abs Immature Granulocytes 0.04 0.00 - 0.07 K/uL    Comment: Performed at [facility], [address] [place],  [place] [address]  Protime-INR     Status: None   Collection Time: 07/14/21  3:15 PM  Result Value Ref Range   Prothrombin Time 15.1 11.4 - 15.2 seconds   INR 1.2 0.8 - 1.2    Comment: (NOTE) INR goal varies based on device and disease states. Performed at [facility], [address] [place], [place] [address]    DG Knee Complete 4 Views Left  Result Date: 07/14/2021 CLINICAL DATA:  Left femoral neck fracture after a fall EXAM: LEFT KNEE - COMPLETE 4+ VIEW COMPARISON:  Hip radiographs of same date dictated separately. MRI of 04/10/2007. FINDINGS: Marked patellofemoral and moderate medial/lateral compartment joint space narrowing with osteophyte formation. Chondrocalcinosis within the menisci. Small suprapatellar joint effusion. No acute fracture or dislocation. 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 Electronically Signed   By: Abigail Miyamoto M.D.   On: 07/14/2021 15:57   DG Chest Port 1 View  Result Date: 07/14/2021 CLINICAL DATA:  Preop. EXAM: PORTABLE CHEST 1 VIEW COMPARISON:  09/13/2020. FINDINGS: Cardiomediastinal silhouette is similar when accounting for differences in technique. Left subclavian approach cardiac rhythm maintenance device. No consolidation. No visible pleural effusions or pneumothorax. No acute osseous abnormality. IMPRESSION: No evidence of acute cardiopulmonary disease. Electronically Signed   By: Margaretha Sheffield M.D.   On: 07/14/2021 15:35   DG Hip Unilat W or Wo Pelvis 2-3 Views Left  Result Date: 07/14/2021 CLINICAL DATA:  Injury,  fall EXAM: DG HIP (WITH OR WITHOUT PELVIS) 2-3V LEFT COMPARISON:  CT 03/09/2020 FINDINGS: There is an impacted subcapital left femoral neck fracture with minimal displacement. Lower lumbar spine degenerative changes. IMPRESSION: Impacted subcapital left femoral neck fracture with minimal displacement. Electronically Signed   By: Maurine Simmering M.D.   On: 07/14/2021 14:50    Pending Labs Unresulted Labs (From admission, onward)     Start     Ordered   07/14/21 1515  Type and screen [facility]  Once,   STAT       Comments: [facility] [place]    07/14/21 1515            Vitals/Pain Today's Vitals   07/14/21 1500 07/14/21 1541 07/14/21 1600 07/14/21 1812  BP: (!) 116/44  (!) 144/62   Pulse: 62 70 68   Resp: '18 15 15   '$ Temp:      TempSrc:      SpO2: 98% 100% 97%   PainSc:    3     Isolation Precautions No active isolations  Medications Medications  oxyCODONE-acetaminophen (PERCOCET/ROXICET) 5-325 MG per tablet 0.5 tablet (0.5 tablets Oral Given 07/14/21 1630)    Mobility non-ambulatory Moderate fall risk    R Recommendations: See Admitting Provider Note  Report given to: Douglass Rivers, RN [phone]

## 2021-07-14 NOTE — ED Provider Notes (Signed)
[place] EMERGENCY DEPARTMENT Provider Note   CSN: [id number] Arrival date & time: 07/14/21  1418     History  Chief Complaint  Patient presents with   Hip Pain    Veronica Bradford is a 76 y.o. female.   76 yo female who presents to the ED with right sided hip pain after falling earlier today. She denies hitting her head or any loss of consciousness. Of note, she reports she is currently taking Xarelto (last taken last night at 7 pm) and has a Psychologist, forensic.        Home Medications Prior to Admission medications   Medication Sig Start Date End Date Taking? Authorizing Provider  acetaminophen (TYLENOL) 500 MG tablet Take 500 mg by mouth every 6 (six) hours as needed for moderate pain or headache.    [provider]  acetaminophen-codeine (TYLENOL #3) 300-30 MG tablet Take 1 tablet by mouth daily as needed for moderate pain. 05/31/21   Colon Branch, MD  ARTIFICIAL TEAR SOLUTION OP Place 1 drop into both eyes 4 (four) times daily.    [provider]  cholecalciferol (VITAMIN D3) 25 MCG (1000 UNIT) tablet Take 1,000 Units by mouth daily.    [provider]  CRANBERRY EXTRACT PO Take 2 tablets by mouth daily.    [provider]  diltiazem (CARDIZEM CD) 120 MG 24 hr capsule TAKE 1 CAPSULE(120 MG) BY MOUTH DAILY 04/06/21   [name], MD  diltiazem (CARDIZEM) 30 MG tablet Take 1 tablet every 4 hours AS NEEDED for heart rate >100 as long as blood pressure >100. 01/27/21   Sherran Needs, NP  Fish Oil-Cholecalciferol (OMEGA-3 + D PO) Take 1 capsule by mouth daily.    [provider]  flecainide (TAMBOCOR) 100 MG tablet Take 1 tablet (100 mg total) by mouth 2 (two) times daily. 02/03/21   [name], MD  fluticasone [facility]) 50 MCG/ACT nasal spray Place 1 spray into both nostrils every other day.    [provider]  furosemide (LASIX) 40 MG tablet Take 1 tablet (40 mg total) by mouth daily. 01/04/21   [name], MD   pantoprazole (PROTONIX) 40 MG tablet Take 1 tablet (40 mg total) by mouth 2 (two) times daily. Patient taking differently: Take 40 mg by mouth daily. 01/27/21   Sherran Needs, NP  pravastatin (PRAVACHOL) 40 MG tablet TAKE 1 TABLET(40 MG) BY MOUTH AT BEDTIME 05/10/21   Colon Branch, MD  pyridOXINE (VITAMIN B-6) 100 MG tablet Take 100 mg by mouth daily.    [provider]  rifaximin (XIFAXAN) 550 MG TABS tablet Take 1 tablet by mouth every 12 (twelve) hours. 01/31/21   [provider]  rivaroxaban (XARELTO) 20 MG TABS tablet Take 1 tablet (20 mg total) by mouth daily with supper. 01/31/21   [name], MD  Tdap [facility] Reges) 5-2.5-18.5 LF-MCG/0.5 injection Inject into the muscle. 03/23/21   Carlyle Basques, MD  triamterene-hydrochlorothiazide [facility]) 37.5-25 MG tablet TAKE 1/2 TABLET BY MOUTH DAILY 06/14/21   Colon Branch, MD      Allergies    Ibuprofen, Lactose intolerance (gi), Lisinopril, Nsaids, Tape, Tolmetin, Zoster vaccine live, Amoxicillin, and Penicillins    Review of Systems   Review of Systems Negative except as per HPI Physical Exam Updated Vital Signs BP (!) 144/62   Pulse 68   Temp 97.8 F (36.6 C) (Oral)   Resp 15   SpO2 97%  Physical Exam Vitals  and nursing note reviewed.  Constitutional:      General: She is not in acute distress.    Appearance: She is well-developed. She is not diaphoretic.  HENT:     Head: Normocephalic and atraumatic.  Cardiovascular:     Rate and Rhythm: Normal rate and regular rhythm.     Pulses: Normal pulses.     Heart sounds: Normal heart sounds.  Pulmonary:     Effort: Pulmonary effort is normal.     Breath sounds: Normal breath sounds.  Abdominal:     Palpations: Abdomen is soft.     Tenderness: There is no abdominal tenderness.  Musculoskeletal:        General: Tenderness present. No swelling or deformity.     Right lower leg: No edema.     Left lower leg: No edema.     Comments: Pain over left hip.  Right  lower extremity normal range of motion.  Skin:    General: Skin is warm and dry.     Findings: No erythema or rash.  Neurological:     Mental Status: She is alert and oriented to person, place, and time.     Sensory: No sensory deficit.     Motor: No weakness.  Psychiatric:        Behavior: Behavior normal.     ED Results / Procedures / Treatments   Labs (all labs ordered are listed, but only abnormal results are displayed) Labs Reviewed  BASIC METABOLIC PANEL - Abnormal; Notable for the following components:      Result Value   Potassium 3.3 (*)    Calcium 10.5 (*)    All other components within normal limits  CBC WITH DIFFERENTIAL/PLATELET  PROTIME-INR  TYPE AND SCREEN    EKG None  Radiology DG Knee Complete 4 Views Left  Result Date: 07/14/2021 CLINICAL DATA:  Left femoral neck fracture after a fall EXAM: LEFT KNEE - COMPLETE 4+ VIEW COMPARISON:  Hip radiographs of same date dictated separately. MRI of 04/10/2007. FINDINGS: Marked patellofemoral and moderate medial/lateral compartment joint space narrowing with osteophyte formation. Chondrocalcinosis within the menisci. Small suprapatellar joint effusion. No acute fracture or dislocation. 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 Electronically Signed   By: Abigail Miyamoto M.D.   On: 07/14/2021 15:57   DG Chest Port 1 View  Result Date: 07/14/2021 CLINICAL DATA:  Preop. EXAM: PORTABLE CHEST 1 VIEW COMPARISON:  09/13/2020. FINDINGS: Cardiomediastinal silhouette is similar when accounting for differences in technique. Left subclavian approach cardiac rhythm maintenance device. No consolidation. No visible pleural effusions or pneumothorax. No acute osseous abnormality. IMPRESSION: No evidence of acute cardiopulmonary disease. Electronically Signed   By: Margaretha Sheffield M.D.   On: 07/14/2021  15:35   DG Hip Unilat W or Wo Pelvis 2-3 Views Left  Result Date: 07/14/2021 CLINICAL DATA:  Injury, fall EXAM: DG HIP (WITH OR WITHOUT PELVIS) 2-3V LEFT COMPARISON:  CT 03/09/2020 FINDINGS: There is an impacted subcapital left femoral neck fracture with minimal displacement. Lower lumbar spine degenerative changes. IMPRESSION: Impacted subcapital left femoral neck fracture with minimal displacement. Electronically Signed   By: Maurine Simmering M.D.   On: 07/14/2021 14:50    Procedures Procedures    Medications Ordered in ED Medications  oxyCODONE-acetaminophen (PERCOCET/ROXICET) 5-325 MG per tablet 0.5 tablet (0.5 tablets Oral Given 07/14/21 1630)    ED Course/ Medical Decision Making/ A&P  Medical Decision Making Amount and/or Complexity of Data Reviewed Labs: ordered. Radiology: ordered.   This patient presents to the ED for concern of left hip pain after fall, this involves an extensive number of treatment options, and is a complaint that carries with it a high risk of complications and morbidity.  The differential diagnosis includes but not limited to fracture, dislocation, sprain   Co morbidities that complicate the patient evaluation  A-fib, pacemaker, anemia, hypertension, GERD osteopenia   Additional history obtained:  External records from outside source obtained and reviewed including prior labs reviewed including CBC, CMP.   Lab Tests:  I Ordered, and personally interpreted labs.  The pertinent results include: CBC within normal limits, INR normal, BMP with mild hypokalemia with potassium of 3.3.   Imaging Studies ordered:  I ordered imaging studies including left hip I independently visualized and interpreted imaging which showed hip fracture I agree with the radiologist interpretation   Consultations Obtained:  I requested consultation with the orthopedic team, Silvestre Gunner, PA-C,  and discussed lab and imaging findings as well as  pertinent plan - they recommend: Admit to [facility], home Xarelto with plan for surgery on Monday with Dr. Lyla Glassing Case discussed with Dr. Karleen Hampshire with [facility] who will consult for admission.   Problem List / ED Course / Critical interventions / Medication management  76 year old female presents with left hip pain after mechanical fall earlier today.  Patient is on Xarelto, did not hit head, no loss of consciousness.  Denies any other injuries, complaints, concerns.  Patient did try to stand and ambulate after the fall however reports significant pain in her left hip if she tries to bear weight.  Patient is found to have pain over the left hip, DP pulse and sensation intact.  No pain at the left knee.  No pain in the right leg.  Upper extremity range of motion is normal.  Neuro exam grossly intact.  X-ray of the left hip shows a left femoral neck fracture.  Case was discussed with orthopedics who request admission to [facility] with plan for surgery on Monday.  Case was discussed with hospitalist who will admit. I ordered medication including Percocet for pain Reevaluation of the patient after these medicines showed that the patient improved I have reviewed the patients home medicines and have made adjustments as needed   Social Determinants of Health:  Lives at home with husband   Test / Admission - Considered:  Admit for hip fracture         Final Clinical Impression(s) / ED Diagnoses Final diagnoses:  Closed fracture of left hip, initial encounter [facility])    Rx / DC Orders ED Discharge Orders     None         Tacy Learn, PA-C 07/14/21 1727    Tegeler, Gwenyth Allegra, MD 07/15/21 1457

## 2021-07-14 NOTE — Progress Notes (Addendum)
Asked to provide surgical care by Silvestre Gunner, PA-C for displaced L femoral neck fx after a ground level fall at home. Patient went to [facility] due to hip pain and inability to WB. 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 Plan for [facility] admission at [facility] with surgery on Monday in order to allow washout of xarelto. NPO after MN Sunday night. Hold xarelto; ok to bridge with lovenox. Last dose of lovenox should be Saturday pm.

## 2021-07-14 NOTE — ED Triage Notes (Signed)
Patient presents to ED via POV from home. Patient reports mechanical fall PTA. Reports left hip pain. Patient is currently on Xarelto. Denies hitting head. Denies LOC. Patient was able to get herself up off the floor.

## 2021-07-14 NOTE — ED Notes (Signed)
[facility] at bedside. Pt stable for transport. 

## 2021-07-14 NOTE — H&P (Signed)
History and Physical    Veronica Bradford JKD:[id number] DOB: [birth year] DOA: 07/14/2021  PCP: Colon Branch, MD   Patient coming from: Home   Chief Complaint: Left hip pain after a fall   HPI: Veronica Bradford is a 76 y.o. female with medical history significant for hypertension, atrial fibrillation on Xarelto, tachybradycardia syndrome with pacer, and BMI 35, who presented to the emergency department with severe left hip pain after a fall.  Patient reports that she was in her usual state of health and having an uneventful day when she tripped and fell directly onto her left hip.  She experienced severe pain when she tried to bear weight and suspected she had suffered a fracture.  Patient reports that she walks 1.5 to 2 miles daily and never experiences shortness of breath or chest pain with that.  She had a nuclear stress test in August 2021 that was a low risk, normal study.  [facility] ED Course: Upon arrival to the ED, patient is found to be afebrile and saturating well on room air with stable blood pressure.  EKG features sinus rhythm with first-degree AV nodal block, RBBB, LAFB, and LVH with repolarization abnormality.  Chest x-ray negative for acute cardiopulmonary disease.  Plain films of the hip and pelvis demonstrate impacted subcapital left femoral neck fracture with minimal displacement.  Orthopedic surgery was consulted by the ED physician and recommended medical admission to [facility].  Review of Systems:  All other systems reviewed and apart from HPI, are negative.  Past Medical History:  Diagnosis Date   Atrial fibrillation ([place])    Back pain    Chronic sinusitis    s/p 2 surgeries remotely, [name]   Closed fracture of metatarsal of left foot    L foot, fifth metatarsal   Ectopic pregnancy    Endometrial cancer ([place]) 2004   Early diagnosis   GERD (gastroesophageal reflux disease)    Hyperlipemia    Hypertension    IBS (irritable bowel syndrome) 08/13/2014   Dx 2015,  diarrhea on-off, Dr [name]    Lumbar radiculopathy 08/2012   MRI in 08/2012   Mild hyperparathyroidism ([place])    Osteopenia    PAF (paroxysmal atrial fibrillation) (HCC)    Prediabetes    Sciatica of right side 08/2013   Received Depomedrol 80 mg injection   Sleep apnea 09/09/2013   Dx with OSA in 2015, by Dr. Caryl Comes, APAP    Past Surgical History:  Procedure Laterality Date   ABDOMINAL HYSTERECTOMY     ATRIAL FIBRILLATION ABLATION N/A 09/04/2017   Procedure: [place];  Surgeon: [name], MD;  Location: [place] CV LAB;  Service: Cardiovascular;  Laterality: N/A;   BIOPSY  03/11/2020   Procedure: BIOPSY;  Surgeon: [name], MD;  Location: [facility] ENDOSCOPY;  Service: Endoscopy;;  EGD and COLON   CARPAL TUNNEL RELEASE Right 06/17/2019   CARPAL TUNNEL RELEASE Left 11/2020   CHOLECYSTECTOMY  ~2006   COLONOSCOPY WITH PROPOFOL N/A 03/11/2020   Procedure: COLONOSCOPY WITH PROPOFOL;  Surgeon: [name], MD;  Location: [facility] ENDOSCOPY;  Service: Endoscopy;  Laterality: N/A;   DILATION AND CURETTAGE OF UTERUS     after SAB   ESOPHAGOGASTRODUODENOSCOPY  02/09/2014   Dr. [name]   ESOPHAGOGASTRODUODENOSCOPY (EGD) WITH PROPOFOL N/A 03/11/2020   Procedure: ESOPHAGOGASTRODUODENOSCOPY (EGD) WITH PROPOFOL;  Surgeon: [name], MD;  Location: [facility] ENDOS[facility];  Service: Endoscopy;  Laterality: N/A;   FINGER SURGERY Left    index   NASAL  SINUS SURGERY     x 2 remotely, [name]   PACEMAKER IMPLANT N/A 09/13/2020   Procedure: PACEMAKER IMPLANT;  Surgeon: [name], MD;  Location: [place] CV LAB;  Service: Cardiovascular;  Laterality: N/A;   TONSILLECTOMY     TOTAL ABDOMINAL HYSTERECTOMY W/ BILATERAL SALPINGOOPHORECTOMY     TOTAL KNEE ARTHROPLASTY Right ~2008    Social History:   reports that she quit smoking about 41 years ago. Her smoking use included cigarettes. She has never used smokeless tobacco. She reports current alcohol use. She reports that  she does not use drugs.  Allergies  Allergen Reactions   Ibuprofen Other (See Comments)    Pt is on Xarelto   Lactose Intolerance (Gi) Diarrhea and Nausea And Vomiting   Lisinopril Cough   Nsaids Other (See Comments)    Pt on Xarelto   Tape     Causes blisters    Tolmetin Other (See Comments)    Pt on Xarelto   Zoster Vaccine Live Other (See Comments)    Serum sickness, high fever, neck pain    Amoxicillin Rash   Penicillins Rash    Local reaction    Family History  Problem Relation Age of Onset   Hypertension Mother    Colon cancer Mother    Breast cancer Mother    Ovarian cancer Mother    Hypertension Father    Diabetes Father    Head & neck cancer Sister    CAD Neg Hx      Prior to Admission medications   Medication Sig Start Date End Date Taking? Authorizing Provider  acetaminophen (TYLENOL) 500 MG tablet Take 500 mg by mouth every 6 (six) hours as needed for moderate pain or headache.   Yes [provider]  acetaminophen-codeine (TYLENOL #3) 300-30 MG tablet Take 1 tablet by mouth daily as needed for moderate pain. 05/31/21  Yes Paz, Alda Berthold, MD  ARTIFICIAL TEAR SOLUTION OP Place 1 drop into both eyes 4 (four) times daily.   Yes [provider]  cholecalciferol (VITAMIN D3) 25 MCG (1000 UNIT) tablet Take 1,000 Units by mouth daily.   Yes [provider]  CRANBERRY EXTRACT PO Take 2 tablets by mouth daily.   Yes [provider]  Cyanocobalamin (VITAMIN B 12 PO) Take 1 tablet by mouth daily.   Yes [provider]  diltiazem (CARDIZEM CD) 120 MG 24 hr capsule TAKE 1 CAPSULE(120 MG) BY MOUTH DAILY Patient taking differently: Take 120 mg by mouth daily. 04/06/21  Yes [name], MD  Ferrous Sulfate (IRON PO) Take 1 tablet by mouth daily.   Yes [provider]  flecainide (TAMBOCOR) 100 MG tablet Take 1 tablet (100 mg total) by mouth 2 (two) times daily. 02/03/21  Yes [name], MD  fluticasone [facility]) 50  MCG/ACT nasal spray Place 1 spray into both nostrils every other day.   Yes [provider]  FOLIC ACID PO Take 1 tablet by mouth daily.   Yes [provider]  furosemide (LASIX) 40 MG tablet Take 1 tablet (40 mg total) by mouth daily. 01/04/21  Yes [name], MD  Omega-3 Fatty Acids (FISH OIL PO) Take 1 tablet by mouth daily.   Yes [provider]  pantoprazole (PROTONIX) 40 MG tablet Take 1 tablet (40 mg total) by mouth 2 (two) times daily. Patient taking differently: Take 40 mg by mouth daily. 01/27/21  Yes Sherran Needs, NP  pravastatin (PRAVACHOL) 40 MG tablet  TAKE 1 TABLET(40 MG) BY MOUTH AT BEDTIME Patient taking differently: Take 40 mg by mouth daily. 05/10/21  Yes Paz, Alda Berthold, MD  rivaroxaban (XARELTO) 20 MG TABS tablet Take 1 tablet (20 mg total) by mouth daily with supper. 01/31/21  Yes [name], MD  triamterene-hydrochlorothiazide (ZOXWRUE-45) 37.5-25 MG tablet TAKE 1/2 TABLET BY MOUTH DAILY Patient taking differently: Take 0.5 tablets by mouth daily. 06/14/21  Yes Paz, Alda Berthold, MD  diltiazem (CARDIZEM) 30 MG tablet Take 1 tablet every 4 hours AS NEEDED for heart rate >100 as long as blood pressure >100. 01/27/21   Sherran Needs, NP  Tdap [facility] Reges) 5-2.5-18.5 LF-MCG/0.5 injection Inject into the muscle. 03/23/21   Carlyle Basques, MD    Physical Exam: Vitals:   07/14/21 1900 07/14/21 2118 07/14/21 2142 07/15/21 0138  BP: 122/72  (!) 124/57 (!) 126/50  Pulse: 62  62 61  Resp: '17  18 17  '$ Temp:   98.3 F (36.8 C) 98.5 F (36.9 C)  TempSrc:   Oral Oral  SpO2: 98%  99% 98%  Weight:  104.3 kg    Height:  '5\' 8"'$  (1.727 m)      Constitutional: NAD, calm  Eyes: PERTLA, lids and conjunctivae normal ENMT: Mucous membranes are moist. Posterior pharynx clear of any exudate or lesions.   Neck: supple, no masses  Respiratory: no wheezing, no crackles. No accessory muscle use.  Cardiovascular: S1 & S2 heard, regular rate and rhythm. No significant  JVD. Abdomen: No distension, no tenderness, soft. Bowel sounds active.  Musculoskeletal: no clubbing / cyanosis. Tender left hip, neurovascularly intact.   Skin: no significant rashes, lesions, ulcers. Warm, dry, well-perfused. Neurologic: CN 2-12 grossly intact. Moving all extremities. Alert and oriented.  Psychiatric: Calm. Cooperative.    Labs and Imaging on Admission: I have personally reviewed following labs and imaging studies  CBC: Recent Labs  Lab 07/14/21 1515 07/15/21 0418  WBC 8.1 6.7  NEUTROABS 6.0  --   HGB 14.3 12.3  HCT 42.8 37.5  MCV 90.1 92.1  PLT 210 409   Basic Metabolic Panel: Recent Labs  Lab 07/14/21 1515 07/15/21 0418  NA 140 143  K 3.3* 3.7  CL 104 112*  CO2 28 28  GLUCOSE 99 108*  BUN 17 17  CREATININE 0.77 0.63  CALCIUM 10.5* 10.2  MG  --  2.2   GFR: Estimated Creatinine Clearance: 79.2 mL/min (by C-G formula based on SCr of 0.63 mg/dL). Liver Function Tests: No results for input(s): "AST", "ALT", "ALKPHOS", "BILITOT", "PROT", "ALBUMIN" in the last 168 hours. No results for input(s): "LIPASE", "AMYLASE" in the last 168 hours. No results for input(s): "AMMONIA" in the last 168 hours. Coagulation Profile: Recent Labs  Lab 07/14/21 1515  INR 1.2   Cardiac Enzymes: No results for input(s): "CKTOTAL", "CKMB", "CKMBINDEX", "TROPONINI" in the last 168 hours. BNP (last 3 results) No results for input(s): "PROBNP" in the last 8760 hours. HbA1C: No results for input(s): "HGBA1C" in the last 72 hours. CBG: No results for input(s): "GLUCAP" in the last 168 hours. Lipid Profile: No results for input(s): "CHOL", "HDL", "LDLCALC", "TRIG", "CHOLHDL", "LDLDIRECT" in the last 72 hours. Thyroid Function Tests: No results for input(s): "TSH", "T4TOTAL", "FREET4", "T3FREE", "THYROIDAB" in the last 72 hours. Anemia Panel: No results for input(s): "VITAMINB12", "FOLATE", "FERRITIN", "TIBC", "IRON", "RETICCTPCT" in the last 72 hours. Urine analysis:     Component Value Date/Time   COLORURINE YELLOW 05/02/2016 0951   APPEARANCEUR Cloudy (A) 05/02/2016 8119  LABSPEC 1.025 05/02/2016 0951   PHURINE 6.0 05/02/2016 0951   GLUCOSEU NEGATIVE 05/02/2016 0951   HGBUR NEGATIVE 05/02/2016 0951   BILIRUBINUR negative 09/05/2019 1352   KETONESUR negative 08/11/2019 0929   KETONESUR NEGATIVE 05/02/2016 0951   PROTEINUR Negative 09/05/2019 1352   PROTEINUR NEGATIVE 10/30/2006 0945   UROBILINOGEN 0.2 09/05/2019 1352   UROBILINOGEN 0.2 05/02/2016 0951   NITRITE negative 09/05/2019 1352   NITRITE NEGATIVE 05/02/2016 0951   LEUKOCYTESUR Negative 09/05/2019 1352   Sepsis Labs: '@LABRCNTIP'$ (procalcitonin:4,lacticidven:4) )No results found for this or any previous visit (from the past 240 hour(s)).   Radiological Exams on Admission: DG Knee Complete 4 Views Left  Result Date: 07/14/2021 CLINICAL DATA:  Left femoral neck fracture after a fall EXAM: LEFT KNEE - COMPLETE 4+ VIEW COMPARISON:  Hip radiographs of same date dictated separately. MRI of 04/10/2007. FINDINGS: Marked patellofemoral and moderate medial/lateral compartment joint space narrowing with osteophyte formation. Chondrocalcinosis within the menisci. Small suprapatellar joint effusion. No acute fracture or dislocation. 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 Electronically Signed   By: Abigail Miyamoto M.D.   On: 07/14/2021 15:57   DG Chest Port 1 View  Result Date: 07/14/2021 CLINICAL DATA:  Preop. EXAM: PORTABLE CHEST 1 VIEW COMPARISON:  09/13/2020. FINDINGS: Cardiomediastinal silhouette is similar when accounting for differences in technique. Left subclavian approach cardiac rhythm maintenance device. No consolidation. No visible pleural effusions or pneumothorax. No acute osseous abnormality. IMPRESSION: No evidence of acute cardiopulmonary disease.  Electronically Signed   By: Margaretha Sheffield M.D.   On: 07/14/2021 15:35   DG Hip Unilat W or Wo Pelvis 2-3 Views Left  Result Date: 07/14/2021 CLINICAL DATA:  Injury, fall EXAM: DG HIP (WITH OR WITHOUT PELVIS) 2-3V LEFT COMPARISON:  CT 03/09/2020 FINDINGS: There is an impacted subcapital left femoral neck fracture with minimal displacement. Lower lumbar spine degenerative changes. IMPRESSION: Impacted subcapital left femoral neck fracture with minimal displacement. Electronically Signed   By: Maurine Simmering M.D.   On: 07/14/2021 14:50    EKG: Independently reviewed. Sinus rhythm, 1st degree AV block, RBBB, LAFB, LVH with repolarization abnormality.   Assessment/Plan   1. Left hip fracture  - Presents with severe left hip pain after trip and fall directly onto the hip; found to have subcapital left femur fracture  - Appreciate orthopedic surgery consultation  - Based on the available data, Mr. Forrey presents an estimated 0.7% risk of perioperative MI or cardiac arrest  - Hold Xarelto (last dose was pm of 7/12) and bridge with Lovenox, continue pain-control and supportive care   2. PAF  - Maintaining SR on flecainide  - Hold Xarelto (last dose pm of 7/12), bridge with Lovenox, continue flecainide    3. Chronic HFpEF  - EF was preserved on TTE from February 2023  - Appears compensated  - Monitor volume status    4. Hypokalemia  - Replaced   5. OSA  - Continue CPAP qHS     DVT prophylaxis: Lovenox  Code Status: Full  Level of Care: Level of care: Telemetry Family Communication: none present  Disposition Plan:  Patient is from: home  Anticipated d/c is to: TBD Anticipated d/c date is: 07/19/21  Patient currently: Pending operative hip repair  Consults called: orthopedic surgery  Admission status: Inpatient     Vianne Bulls, MD [facility]  07/15/2021, 5:45 AM

## 2021-07-15 ENCOUNTER — Encounter: Payer: Self-pay | Admitting: Internal Medicine

## 2021-07-15 DIAGNOSIS — S72002A Fracture of unspecified part of neck of left femur, initial encounter for closed fracture: Secondary | ICD-10-CM | POA: Diagnosis not present

## 2021-07-15 DIAGNOSIS — I480 Paroxysmal atrial fibrillation: Secondary | ICD-10-CM | POA: Diagnosis not present

## 2021-07-15 LAB — CBC
HCT: 37.5 % (ref 36.0–46.0)
Hemoglobin: 12.3 g/dL (ref 12.0–15.0)
MCH: 30.2 pg (ref 26.0–34.0)
MCHC: 32.8 g/dL (ref 30.0–36.0)
MCV: 92.1 fL (ref 80.0–100.0)
Platelets: 193 10*3/uL (ref 150–400)
RBC: 4.07 MIL/uL (ref 3.87–5.11)
RDW: 14.6 % (ref 11.5–15.5)
WBC: 6.7 10*3/uL (ref 4.0–10.5)
nRBC: 0 % (ref 0.0–0.2)

## 2021-07-15 LAB — BASIC METABOLIC PANEL
Anion gap: 3 — ABNORMAL LOW (ref 5–15)
BUN: 17 mg/dL (ref 8–23)
CO2: 28 mmol/L (ref 22–32)
Calcium: 10.2 mg/dL (ref 8.9–10.3)
Chloride: 112 mmol/L — ABNORMAL HIGH (ref 98–111)
Creatinine, Ser: 0.63 mg/dL (ref 0.44–1.00)
GFR, Estimated: >60 mL/min (ref >60)
Glucose, Bld: 108 mg/dL — ABNORMAL HIGH (ref 70–99)
Potassium: 3.7 mmol/L (ref 3.5–5.1)
Sodium: 143 mmol/L (ref 135–145)

## 2021-07-15 LAB — MAGNESIUM: Magnesium: 2.2 mg/dL (ref 1.7–2.4)

## 2021-07-15 MED ORDER — PRAVASTATIN SODIUM 20 MG PO TABS
40.0000 mg | ORAL_TABLET | Freq: Every day | ORAL | Status: DC
  Administered 2021-07-15 – 2021-07-20 (×5): 40 mg via ORAL
  Filled 2021-07-15 (×5): qty 2

## 2021-07-15 MED ORDER — METHOCARBAMOL 1000 MG/10ML IJ SOLN: 500.0000 mg | Freq: Four times a day (QID) | INTRAVENOUS | Status: DC | PRN

## 2021-07-15 MED ORDER — DILTIAZEM HCL ER COATED BEADS 120 MG PO CP24
120.0000 mg | ORAL_CAPSULE | Freq: Every day | ORAL | Status: DC
Start: 2021-07-15 — End: 2021-07-20
  Administered 2021-07-15 – 2021-07-19 (×5): 120 mg via ORAL
  Filled 2021-07-15 (×6): qty 1

## 2021-07-15 MED ORDER — PANTOPRAZOLE SODIUM 40 MG PO TBEC
40.0000 mg | DELAYED_RELEASE_TABLET | Freq: Every day | ORAL | Status: DC
  Administered 2021-07-15 – 2021-07-21 (×7): 40 mg via ORAL
  Filled 2021-07-15 (×7): qty 1

## 2021-07-15 MED ORDER — METHOCARBAMOL 500 MG PO TABS
500.0000 mg | ORAL_TABLET | Freq: Four times a day (QID) | ORAL | Status: DC | PRN
  Administered 2021-07-19 – 2021-07-21 (×4): 500 mg via ORAL
  Filled 2021-07-15 (×5): qty 1

## 2021-07-15 MED ORDER — HYDROCODONE-ACETAMINOPHEN 5-325 MG PO TABS
1.0000 | ORAL_TABLET | Freq: Four times a day (QID) | ORAL | Status: DC | PRN
  Administered 2021-07-15: 2 via ORAL
  Administered 2021-07-15: 1 via ORAL
  Administered 2021-07-15: 2 via ORAL
  Filled 2021-07-15: qty 2
  Filled 2021-07-15: qty 1
  Filled 2021-07-15: qty 2

## 2021-07-15 MED ORDER — MORPHINE SULFATE (PF) 2 MG/ML IV SOLN
1.0000 mg | INTRAVENOUS | Status: DC | PRN
  Filled 2021-07-15: qty 1

## 2021-07-15 MED ORDER — TRIAMTERENE-HCTZ 37.5-25 MG PO TABS
0.5000 | ORAL_TABLET | Freq: Every day | ORAL | Status: DC
  Administered 2021-07-15 – 2021-07-19 (×4): 0.5 via ORAL
  Filled 2021-07-15 (×5): qty 1

## 2021-07-15 MED ORDER — ENSURE MAX PROTEIN PO LIQD
11.0000 [oz_av] | Freq: Every day | ORAL | Status: DC
  Administered 2021-07-15 – 2021-07-20 (×4): 11 [oz_av] via ORAL
  Filled 2021-07-15 (×7): qty 330

## 2021-07-15 MED ORDER — FLECAINIDE ACETATE 100 MG PO TABS
100.0000 mg | ORAL_TABLET | Freq: Two times a day (BID) | ORAL | Status: DC
  Administered 2021-07-15 – 2021-07-21 (×13): 100 mg via ORAL
  Filled 2021-07-15 (×13): qty 1

## 2021-07-15 MED ORDER — ENOXAPARIN SODIUM 100 MG/ML IJ SOSY
100.0000 mg | PREFILLED_SYRINGE | Freq: Two times a day (BID) | INTRAMUSCULAR | Status: AC
  Administered 2021-07-15 – 2021-07-16 (×4): 100 mg via SUBCUTANEOUS
  Filled 2021-07-15 (×4): qty 1

## 2021-07-15 MED ORDER — SENNOSIDES-DOCUSATE SODIUM 8.6-50 MG PO TABS: 1.0000 | ORAL_TABLET | Freq: Every evening | ORAL | Status: DC | PRN

## 2021-07-15 NOTE — Progress Notes (Signed)
ANTICOAGULATION CONSULT NOTE - Initial Consult  Pharmacy Consult for enoxaparin Indication: atrial fibrillation  Allergies  Allergen Reactions   Ibuprofen Other (See Comments)    Pt is on Xarelto   Lactose Intolerance (Gi) Diarrhea and Nausea And Vomiting   Lisinopril Cough   Nsaids Other (See Comments)    Pt on Xarelto   Tape     Causes blisters    Tolmetin Other (See Comments)    Pt on Xarelto   Zoster Vaccine Live Other (See Comments)    Serum sickness, high fever, neck pain    Amoxicillin Rash   Penicillins Rash    Local reaction    Patient Measurements: Height: '5\' 8"'$  (172.7 cm) Weight: 104.3 kg (229 lb 15 oz) IBW/kg (Calculated) : 63.9 Heparin Dosing Weight:   Vital Signs: Temp: 98.3 F (36.8 C) (07/13 2142) Temp Source: Oral (07/13 2142) BP: 124/57 (07/13 2142) Pulse Rate: 62 (07/13 2142)  Labs: Recent Labs    07/14/21 1515  HGB 14.3  HCT 42.8  PLT 210  LABPROT 15.1  INR 1.2  CREATININE 0.77    Estimated Creatinine Clearance: 79.2 mL/min (by C-G formula based on SCr of 0.77 mg/dL).   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 Caryl Comes, APAP     Assessment: 76 yo female with L femoral neck fx after a ground level fall at home. H/o Afib on Xarelto, GERD,  osteopenia, chronic LBP on T#3, hyperparathyroidism w/ hypercalcemia, endometrial CA.  Pharmacy to dose enoxaparin as bridge prior to surgery.  LD xarelto 1900 on 7/12  CBC WNL  Goal of Therapy:  Anti-Xa level 0.6-1 units/ml 4hrs after LMWH dose given    Plan:  Enoxaparin '100mg'$  SQ q12h Last dose to be given 7/15 pm per consult Plan for surgery 7/16  Dolly Rias RPh 07/15/2021, 12:05 AM

## 2021-07-15 NOTE — H&P (View-Only) (Signed)
ORTHOPAEDIC CONSULTATION  REQUESTING PHYSICIAN: Donne Hazel, MD  PCP:  Colon Branch, MD  Chief Complaint: Left hip pain  HPI: Veronica Bradford is a 76 y.o. female who went to [facility] due to hip pain and inability to WB. Denies any other injuries, complaints, concerns. 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 Dr. Lyla Glassing consulted for surgical care of her displaced L femoral neck fx after a ground level fall at home. She denies LOC or hitting her head. She normally walks 1.5 to 2 miles per day. She does not use any assistive devices at baseline. She lives at home with her husband and son. Denies history of PE/DVT. Patient was transferred to [facility] for medical admission.    Plain films of the hip and pelvis demonstrate impacted subcapital left femoral neck fracture with minimal displacement.    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 Caryl Comes, APAP   Past Surgical History:  Procedure Laterality Date   ABDOMINAL HYSTERECTOMY     ATRIAL FIBRILLATION ABLATION N/A 09/04/2017   Procedure: [place];  Surgeon: [name], MD;  Location: [place] [facility];  Service: Cardiovascular;  Laterality: N/A;    BIOPSY  03/11/2020   Procedure: BIOPSY;  Surgeon: [name], MD;  Location: [facility];  Service: Endoscopy;;  EGD and COLON   CARPAL TUNNEL RELEASE Right 06/17/2019   CARPAL TUNNEL RELEASE Left 11/2020   CHOLECYSTECTOMY  ~2006   COLONOSCOPY WITH PROPOFOL N/A 03/11/2020   Procedure: COLONOSCOPY WITH PROPOFOL;  Surgeon: [name], MD;  Location: [facility];  Service: Endoscopy;  Laterality: N/A;   DILATION AND CURETTAGE OF UTERUS     after SAB   ESOPHAGOGASTRODUODENOSCOPY  02/09/2014   Dr. 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/2020   Procedure: PACEMAKER IMPLANT;  Surgeon: [name], MD;  Location: [place] [facility];  Service: Cardiovascular;  Laterality: N/A;   TONSILLECTOMY     TOTAL ABDOMINAL HYSTERECTOMY W/ BILATERAL SALPINGOOPHORECTOMY     TOTAL KNEE ARTHROPLASTY Right ~2008   Social History   Socioeconomic History   Marital status: Married    Spouse name: Not on file   Number of children: 1   Years of education: Not on file   Highest education level: Not on file  Occupational History   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  Tobacco Use   Smoking status: Former    Types: Cigarettes    Quit date: 01/06/1980    Years since quitting: 41.5   Smokeless tobacco: Never   Tobacco comments:    smoked from [place] to 1982, less than 1 ppd  Vaping Use   Vaping Use: Never used  Substance and Sexual Activity   Alcohol use: Yes    Comment: rare   Drug use: No   Sexual activity: Not Currently    Birth control/protection: None  Other Topics Concern   Not on file  Social History Narrative   Lives w/ husband, and son [name]   Retired Marine scientist   Lives in [place] Strain: [place]  (06/08/2020)   Overall Financial Resource Strain ([place])    Difficulty of Paying Living Expenses: Not hard at all  Food Insecurity: No [place] (06/08/2020)   Hunger Vital Sign    Worried About Running Out of Food in the Last Year: Never true    [place] in the Last Year: Never true  Transportation Needs: No Transportation Needs (06/08/2020)   PRAPARE - Hydrologist (Medical): No    Lack of Transportation (Non-Medical): No  Physical Activity: Sufficiently Active (06/08/2020)   Exercise Vital Sign    Days of Exercise per Week: 7 days    Minutes of Exercise per Session: 30 min  Stress: No Stress Concern Present (06/08/2020)   [place]    Feeling of Stress : Not at all  Social Connections: Moderately Integrated (06/08/2020)   Social Connection and Isolation Panel [NHANES]    Frequency of Communication with Friends and Family: More than three times a week    Frequency of Social Gatherings with Friends and Family: Once a week    Attends Religious Services: Never    Marine scientist or Organizations: Yes    Attends Music therapist: More than 4 times per year    Marital Status: Married   Family History  Problem Relation Age of Onset   Hypertension Mother    Colon cancer Mother    Breast cancer Mother    Ovarian cancer Mother    Hypertension Father    Diabetes Father    Head & neck cancer Sister    CAD Neg Hx    Allergies  Allergen Reactions   Ibuprofen Other (See Comments)    Pt is on Xarelto   Lactose Intolerance (Gi) Diarrhea and Nausea And Vomiting   Lisinopril Cough   Nsaids Other (See Comments)    Pt on Xarelto   Tape     Causes blisters    Tolmetin Other (See Comments)    Pt on Xarelto   Zoster Vaccine Live Other (See Comments)    Serum sickness, high fever, neck pain     Amoxicillin Rash   Penicillins Rash    Local reaction   Prior to Admission medications   Medication Sig Start Date End Date Taking? Authorizing Provider  acetaminophen (TYLENOL) 500 MG tablet Take 500 mg by mouth every 6 (six) hours as needed for moderate pain or headache.   Yes [provider]  acetaminophen-codeine (TYLENOL #3) 300-30 MG tablet Take 1 tablet by mouth daily as needed for moderate pain. 05/31/21  Yes Colon Branch, MD  ARTIFICIAL TEAR SOLUTION OP  Place 1 drop into both eyes 4 (four) times daily.   Yes [provider]  cholecalciferol (VITAMIN D3) 25 MCG (1000 UNIT) tablet Take 1,000 Units by mouth daily.   Yes [provider]  CRANBERRY EXTRACT PO Take 2 tablets by mouth daily.   Yes [provider]  Cyanocobalamin (VITAMIN B 12 PO) Take 1 tablet by mouth daily.   Yes [provider]  diltiazem (CARDIZEM CD) 120 MG 24 hr capsule TAKE 1 CAPSULE(120 MG) BY MOUTH DAILY Patient taking differently: Take 120 mg by mouth daily. 04/06/21  Yes [name], MD  Ferrous Sulfate (IRON PO) Take 1 tablet by mouth daily.   Yes [provider]  flecainide (TAMBOCOR) 100 MG tablet Take 1 tablet (100 mg total) by mouth 2 (two) times daily. 02/03/21  Yes [name], MD  fluticasone [facility]) 50 MCG/ACT nasal spray Place 1 spray into both nostrils every other day.   Yes [provider]  FOLIC ACID PO Take 1 tablet by mouth daily.   Yes [provider]  furosemide (LASIX) 40 MG tablet Take 1 tablet (40 mg total) by mouth daily. 01/04/21  Yes [name], MD  Omega-3 Fatty Acids (FISH OIL PO) Take 1 tablet by mouth daily.   Yes [provider]  pantoprazole (PROTONIX) 40 MG tablet Take 1 tablet (40 mg total) by mouth 2 (two) times daily. Patient taking differently: Take 40 mg by mouth daily. 01/27/21  Yes Sherran Needs, NP  pravastatin (PRAVACHOL) 40 MG tablet TAKE 1 TABLET(40 MG) BY MOUTH AT BEDTIME Patient  taking differently: Take 40 mg by mouth daily. 05/10/21  Yes Paz, Alda Berthold, MD  rivaroxaban (XARELTO) 20 MG TABS tablet Take 1 tablet (20 mg total) by mouth daily with supper. 01/31/21  Yes [name], MD  triamterene-hydrochlorothiazide (JJOACZY-60) 37.5-25 MG tablet TAKE 1/2 TABLET BY MOUTH DAILY Patient taking differently: Take 0.5 tablets by mouth daily. 06/14/21  Yes Paz, Alda Berthold, MD  diltiazem (CARDIZEM) 30 MG tablet Take 1 tablet every 4 hours AS NEEDED for heart rate >100 as long as blood pressure >100. 01/27/21   Sherran Needs, NP  Tdap Durwin Reges) 5-2.5-18.5 LF-MCG/0.5 injection Inject into the muscle. 03/23/21   Carlyle Basques, MD   DG Knee Complete 4 Views Left  Result Date: 07/14/2021 CLINICAL DATA:  Left femoral neck fracture after a fall EXAM: LEFT KNEE - COMPLETE 4+ VIEW COMPARISON:  Hip radiographs of same date dictated separately. MRI of 04/10/2007. FINDINGS: Marked patellofemoral and moderate medial/lateral compartment joint space narrowing with osteophyte formation. Chondrocalcinosis within the menisci. Small suprapatellar joint effusion. No acute fracture or dislocation. 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 Electronically Signed   By: Abigail Miyamoto M.D.   On: 07/14/2021 15:57   DG Chest Port 1 View  Result Date: 07/14/2021 CLINICAL DATA:  Preop. EXAM: PORTABLE CHEST 1 VIEW COMPARISON:  09/13/2020. FINDINGS: Cardiomediastinal silhouette is similar when accounting for differences in technique. Left subclavian approach cardiac rhythm maintenance device. No consolidation. No visible pleural effusions or pneumothorax. No acute osseous abnormality. IMPRESSION: No evidence of acute cardiopulmonary disease. Electronically Signed   By: Margaretha Sheffield M.D.   On: 07/14/2021 15:35   DG Hip Unilat W or Wo Pelvis 2-3 Views Left  Result Date:  07/14/2021 CLINICAL DATA:  Injury, fall EXAM: DG HIP (WITH OR WITHOUT PELVIS) 2-3V LEFT COMPARISON:  CT 03/09/2020 FINDINGS: There  is an impacted subcapital left femoral neck fracture with minimal displacement. Lower lumbar spine degenerative changes. IMPRESSION: Impacted subcapital left femoral neck fracture with minimal displacement. Electronically Signed   By: Maurine Simmering M.D.   On: 07/14/2021 14:50    Positive ROS: All other systems have been reviewed and were otherwise negative with the exception of those mentioned in the HPI and as above.  Physical Exam: General: Alert, no acute distress Cardiovascular: No pedal edema Respiratory: No cyanosis, no use of accessory musculature GI: No organomegaly, abdomen is soft and non-tender Skin: No lesions in the area of chief complaint Neurologic: Sensation intact distally. 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 She states she is at her baseline today during examination.  Psychiatric: Patient is competent for consent with normal mood and affect Lymphatic: No axillary or cervical lymphadenopathy  MUSCULOSKELETAL: Examination of the right hip reveals no skin wounds, lesions, rashes, or erythema. Pannicular fold is clear. No shortening or rotation of the right leg noted at exam. Pain with motion of the hip.   She has slight neuropathy at baseline but she reports she doesn't have loss of sensation during exam. Dorsiflexion, plantar flexion and EHL intact. Distal pedal pulses 2+ bilaterally. No significant pedal edema. Calves soft and non-tender.   Assessment: Displaced left femoral neck fracture.   Plan: Patient has an unstable left femoral neck fracture that will require surgical treatment for pain control and allow immediate mobilization out of bed. She was transferred to [facility] for [facility] admission and has already been consulted for medical admission and perioperative medical optimization. Discussed R/B/A. See risk statement. Plan  for right total hip arthroplasty Monday 07/18/21 in order to allow washout of xarelto. NPO after MN Sunday night. Hold xarelto; ok to bridge with lovenox. Last dose of lovenox should be Saturday pm.    The risks, benefits, and alternatives were discussed with the patient. There are risks associated with the surgery including, but not limited to, problems with anesthesia (death), infection, differences in leg length/angulation/rotation, fracture of bones, loosening or failure of implants, malunion, nonunion, hematoma (blood accumulation) which may require surgical drainage, blood clots, pulmonary embolism, nerve injury (foot drop), and blood vessel injury. The patient understands these risks and elects to proceed.   Charlott Rakes, PA-C   07/15/2021 7:25 AM

## 2021-07-15 NOTE — Consult Note (Signed)
ORTHOPAEDIC CONSULTATION  REQUESTING PHYSICIAN: Donne Hazel, MD  PCP:  Colon Branch, MD  Chief Complaint: Left hip pain  HPI: Veronica Bradford is a 76 y.o. female who went to [facility] due to hip pain and inability to WB. Denies any other injuries, complaints, concerns. 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 Dr. Lyla Glassing consulted for surgical care of her displaced L femoral neck fx after a ground level fall at home. She denies LOC or hitting her head. She normally walks 1.5 to 2 miles per day. She does not use any assistive devices at baseline. She lives at home with her husband and son. Denies history of PE/DVT. Patient was transferred to [facility] for medical admission.    Plain films of the hip and pelvis demonstrate impacted subcapital left femoral neck fracture with minimal displacement.    Past Medical History:  Diagnosis Date   Atrial fibrillation ([place])    Back pain    Chronic sinusitis    s/p 2 surgeries remotely, [name]   Closed fracture of metatarsal of left foot    L foot, fifth metatarsal   Ectopic pregnancy    Endometrial cancer ([place]) 2004   Early diagnosis   GERD (gastroesophageal reflux disease)    Hyperlipemia    Hypertension    IBS (irritable bowel syndrome) 08/13/2014   Dx 2015, diarrhea on-off, Dr [name]    Lumbar radiculopathy 08/2012   MRI in 08/2012   Mild hyperparathyroidism ([place])    Osteopenia    PAF (paroxysmal atrial fibrillation) (HCC)    Prediabetes    Sciatica of right side 08/2013   Received Depomedrol 80 mg injection   Sleep apnea 09/09/2013   Dx with OSA in 2015, by Dr. Caryl Comes, APAP   Past Surgical History:  Procedure Laterality Date   ABDOMINAL HYSTERECTOMY     ATRIAL FIBRILLATION ABLATION N/A 09/04/2017   Procedure: [place];  Surgeon: [name], MD;  Location: [place] [facility];  Service: Cardiovascular;  Laterality: N/A;    BIOPSY  03/11/2020   Procedure: BIOPSY;  Surgeon: [name], MD;  Location: [facility];  Service: Endoscopy;;  EGD and COLON   CARPAL TUNNEL RELEASE Right 06/17/2019   CARPAL TUNNEL RELEASE Left 11/2020   CHOLECYSTECTOMY  ~2006   COLONOSCOPY WITH PROPOFOL N/A 03/11/2020   Procedure: COLONOSCOPY WITH PROPOFOL;  Surgeon: [name], MD;  Location: [facility];  Service: Endoscopy;  Laterality: N/A;   DILATION AND CURETTAGE OF UTERUS     after SAB   ESOPHAGOGASTRODUODENOSCOPY  02/09/2014   Dr. 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/2020   Procedure: PACEMAKER IMPLANT;  Surgeon: [name], MD;  Location: [place] [facility];  Service: Cardiovascular;  Laterality: N/A;   TONSILLECTOMY     TOTAL ABDOMINAL HYSTERECTOMY W/ BILATERAL SALPINGOOPHORECTOMY     TOTAL KNEE ARTHROPLASTY Right ~2008   Social History   Socioeconomic History   Marital status: Married    Spouse name: Not on file   Number of children: 1   Years of education: Not on file   Highest education level: Not on file  Occupational History   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  Tobacco Use   Smoking status: Former    Types: Cigarettes    Quit date: 01/06/1980    Years since quitting: 41.5   Smokeless tobacco: Never   Tobacco comments:    smoked from [place] to 1982, less than 1 ppd  Vaping Use   Vaping Use: Never used  Substance and Sexual Activity   Alcohol use: Yes    Comment: rare   Drug use: No   Sexual activity: Not Currently    Birth control/protection: None  Other Topics Concern   Not on file  Social History Narrative   Lives w/ husband, and son [name]   Retired Marine scientist   Lives in [place] Strain: [place]  (06/08/2020)   Overall Financial Resource Strain ([place])    Difficulty of Paying Living Expenses: Not hard at all  Food Insecurity: No [place] (06/08/2020)   Hunger Vital Sign    Worried About Running Out of Food in the Last Year: Never true    [place] in the Last Year: Never true  Transportation Needs: No Transportation Needs (06/08/2020)   PRAPARE - Hydrologist (Medical): No    Lack of Transportation (Non-Medical): No  Physical Activity: Sufficiently Active (06/08/2020)   Exercise Vital Sign    Days of Exercise per Week: 7 days    Minutes of Exercise per Session: 30 min  Stress: No Stress Concern Present (06/08/2020)   [place]    Feeling of Stress : Not at all  Social Connections: Moderately Integrated (06/08/2020)   Social Connection and Isolation Panel [NHANES]    Frequency of Communication with Friends and Family: More than three times a week    Frequency of Social Gatherings with Friends and Family: Once a week    Attends Religious Services: Never    Marine scientist or Organizations: Yes    Attends Music therapist: More than 4 times per year    Marital Status: Married   Family History  Problem Relation Age of Onset   Hypertension Mother    Colon cancer Mother    Breast cancer Mother    Ovarian cancer Mother    Hypertension Father    Diabetes Father    Head & neck cancer Sister    CAD Neg Hx    Allergies  Allergen Reactions   Ibuprofen Other (See Comments)    Pt is on Xarelto   Lactose Intolerance (Gi) Diarrhea and Nausea And Vomiting   Lisinopril Cough   Nsaids Other (See Comments)    Pt on Xarelto   Tape     Causes blisters    Tolmetin Other (See Comments)    Pt on Xarelto   Zoster Vaccine Live Other (See Comments)    Serum sickness, high fever, neck pain     Amoxicillin Rash   Penicillins Rash    Local reaction   Prior to Admission medications   Medication Sig Start Date End Date Taking? Authorizing Provider  acetaminophen (TYLENOL) 500 MG tablet Take 500 mg by mouth every 6 (six) hours as needed for moderate pain or headache.   Yes [provider]  acetaminophen-codeine (TYLENOL #3) 300-30 MG tablet Take 1 tablet by mouth daily as needed for moderate pain. 05/31/21  Yes Colon Branch, MD  ARTIFICIAL TEAR SOLUTION OP  Place 1 drop into both eyes 4 (four) times daily.   Yes [provider]  cholecalciferol (VITAMIN D3) 25 MCG (1000 UNIT) tablet Take 1,000 Units by mouth daily.   Yes [provider]  CRANBERRY EXTRACT PO Take 2 tablets by mouth daily.   Yes [provider]  Cyanocobalamin (VITAMIN B 12 PO) Take 1 tablet by mouth daily.   Yes [provider]  diltiazem (CARDIZEM CD) 120 MG 24 hr capsule TAKE 1 CAPSULE(120 MG) BY MOUTH DAILY Patient taking differently: Take 120 mg by mouth daily. 04/06/21  Yes [name], MD  Ferrous Sulfate (IRON PO) Take 1 tablet by mouth daily.   Yes [provider]  flecainide (TAMBOCOR) 100 MG tablet Take 1 tablet (100 mg total) by mouth 2 (two) times daily. 02/03/21  Yes [name], MD  fluticasone [facility]) 50 MCG/ACT nasal spray Place 1 spray into both nostrils every other day.   Yes [provider]  FOLIC ACID PO Take 1 tablet by mouth daily.   Yes [provider]  furosemide (LASIX) 40 MG tablet Take 1 tablet (40 mg total) by mouth daily. 01/04/21  Yes [name], MD  Omega-3 Fatty Acids (FISH OIL PO) Take 1 tablet by mouth daily.   Yes [provider]  pantoprazole (PROTONIX) 40 MG tablet Take 1 tablet (40 mg total) by mouth 2 (two) times daily. Patient taking differently: Take 40 mg by mouth daily. 01/27/21  Yes Sherran Needs, NP  pravastatin (PRAVACHOL) 40 MG tablet TAKE 1 TABLET(40 MG) BY MOUTH AT BEDTIME Patient  taking differently: Take 40 mg by mouth daily. 05/10/21  Yes Paz, Alda Berthold, MD  rivaroxaban (XARELTO) 20 MG TABS tablet Take 1 tablet (20 mg total) by mouth daily with supper. 01/31/21  Yes [name], MD  triamterene-hydrochlorothiazide (BPZWCHE-52) 37.5-25 MG tablet TAKE 1/2 TABLET BY MOUTH DAILY Patient taking differently: Take 0.5 tablets by mouth daily. 06/14/21  Yes Paz, Alda Berthold, MD  diltiazem (CARDIZEM) 30 MG tablet Take 1 tablet every 4 hours AS NEEDED for heart rate >100 as long as blood pressure >100. 01/27/21   Sherran Needs, NP  Tdap Durwin Reges) 5-2.5-18.5 LF-MCG/0.5 injection Inject into the muscle. 03/23/21   Carlyle Basques, MD   DG Knee Complete 4 Views Left  Result Date: 07/14/2021 CLINICAL DATA:  Left femoral neck fracture after a fall EXAM: LEFT KNEE - COMPLETE 4+ VIEW COMPARISON:  Hip radiographs of same date dictated separately. MRI of 04/10/2007. FINDINGS: Marked patellofemoral and moderate medial/lateral compartment joint space narrowing with osteophyte formation. Chondrocalcinosis within the menisci. Small suprapatellar joint effusion. No acute fracture or dislocation. 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 Electronically Signed   By: Abigail Miyamoto M.D.   On: 07/14/2021 15:57   DG Chest Port 1 View  Result Date: 07/14/2021 CLINICAL DATA:  Preop. EXAM: PORTABLE CHEST 1 VIEW COMPARISON:  09/13/2020. FINDINGS: Cardiomediastinal silhouette is similar when accounting for differences in technique. Left subclavian approach cardiac rhythm maintenance device. No consolidation. No visible pleural effusions or pneumothorax. No acute osseous abnormality. IMPRESSION: No evidence of acute cardiopulmonary disease. Electronically Signed   By: Margaretha Sheffield M.D.   On: 07/14/2021 15:35   DG Hip Unilat W or Wo Pelvis 2-3 Views Left  Result Date:  07/14/2021 CLINICAL DATA:  Injury, fall EXAM: DG HIP (WITH OR WITHOUT PELVIS) 2-3V LEFT COMPARISON:  CT 03/09/2020 FINDINGS: There  is an impacted subcapital left femoral neck fracture with minimal displacement. Lower lumbar spine degenerative changes. IMPRESSION: Impacted subcapital left femoral neck fracture with minimal displacement. Electronically Signed   By: Maurine Simmering M.D.   On: 07/14/2021 14:50    Positive ROS: All other systems have been reviewed and were otherwise negative with the exception of those mentioned in the HPI and as above.  Physical Exam: General: Alert, no acute distress Cardiovascular: No pedal edema Respiratory: No cyanosis, no use of accessory musculature GI: No organomegaly, abdomen is soft and non-tender Skin: No lesions in the area of chief complaint Neurologic: Sensation intact distally. 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 She states she is at her baseline today during examination.  Psychiatric: Patient is competent for consent with normal mood and affect Lymphatic: No axillary or cervical lymphadenopathy  MUSCULOSKELETAL: Examination of the right hip reveals no skin wounds, lesions, rashes, or erythema. Pannicular fold is clear. No shortening or rotation of the right leg noted at exam. Pain with motion of the hip.   She has slight neuropathy at baseline but she reports she doesn't have loss of sensation during exam. Dorsiflexion, plantar flexion and EHL intact. Distal pedal pulses 2+ bilaterally. No significant pedal edema. Calves soft and non-tender.   Assessment: Displaced left femoral neck fracture.   Plan: Patient has an unstable left femoral neck fracture that will require surgical treatment for pain control and allow immediate mobilization out of bed. She was transferred to [facility] for [facility] admission and has already been consulted for medical admission and perioperative medical optimization. Discussed R/B/A. See risk statement. Plan  for right total hip arthroplasty Monday 07/18/21 in order to allow washout of xarelto. NPO after MN Sunday night. Hold xarelto; ok to bridge with lovenox. Last dose of lovenox should be Saturday pm.    The risks, benefits, and alternatives were discussed with the patient. There are risks associated with the surgery including, but not limited to, problems with anesthesia (death), infection, differences in leg length/angulation/rotation, fracture of bones, loosening or failure of implants, malunion, nonunion, hematoma (blood accumulation) which may require surgical drainage, blood clots, pulmonary embolism, nerve injury (foot drop), and blood vessel injury. The patient understands these risks and elects to proceed.   Charlott Rakes, PA-C   07/15/2021 7:25 AM

## 2021-07-15 NOTE — Hospital Course (Signed)
76 y.o. female with medical history significant for hypertension, atrial fibrillation on Xarelto, tachybradycardia syndrome with pacer, and BMI 35, who presented to the emergency department with severe left hip pain after a fall.  Patient reports that she was in her usual state of health and having an uneventful day when she tripped and fell directly onto her left hip.  She experienced severe pain when she tried to bear weight and suspected she had suffered a fracture.  Patient reports that she walks 1.5 to 2 miles daily and never experiences shortness of breath or chest pain with that.  She had a nuclear stress test in August 2021 that was a low risk, normal study.   Upon arrival to the ED, patient is found to be afebrile and saturating well on room air with stable blood pressure.  EKG features sinus rhythm with first-degree AV nodal block, RBBB, LAFB, and LVH with repolarization abnormality.  Chest x-ray negative for acute cardiopulmonary disease.  Plain films of the hip and pelvis demonstrate impacted subcapital left femoral neck fracture with minimal displacement.  Orthopedic surgery was consulted

## 2021-07-15 NOTE — Plan of Care (Signed)

## 2021-07-15 NOTE — Progress Notes (Signed)
Initial Nutrition Assessment  DOCUMENTATION CODES:   Obesity unspecified  INTERVENTION:   -Ensure MAX Protein po BID, each supplement provides 150 kcal and 30 grams of protein   NUTRITION DIAGNOSIS:   Increased nutrient needs related to hip fracture as evidenced by estimated needs.  GOAL:   Patient will meet greater than or equal to 90% of their needs  MONITOR:   PO intake, Supplement acceptance, Labs, Weight trends, I & O's  REASON FOR ASSESSMENT:   Consult Hip fracture protocol  ASSESSMENT:   76 y.o. female with medical history significant for hypertension, atrial fibrillation on Xarelto, tachybradycardia syndrome with pacer, and BMI 35, who presented to the emergency department with severe left hip pain after a fall.  Patient in room, on a regular diet currently. Pt placing orders on meal ordering app and watching her sodium intake. Reports that she typically has IBS-D symptoms following intakes. Has been on  Weight Watchers the past year and has lost 40 lbs as a result. Very knowledgeable about nutrition and follows her diet closely. 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  Pt is interested in protein shakes to help in recovery from hip surgery. Plan is for surgery 7/17. Will order Ensure Max, pt requests a lower calorie option.  Does not take MVI given calcium content, but takes Vitamin D supplements as  well as folate and Vitamin B-12.  Medications reviewed.  Labs reviewed.  NUTRITION - FOCUSED PHYSICAL EXAM:  No depletions noted.  Diet Order:   Diet Order             Diet NPO time specified Except for: Ice Chips  Diet effective midnight           Diet regular Room service appropriate? Yes; Fluid consistency: Thin  Diet effective now                   EDUCATION NEEDS:   Education needs have been addressed  Skin:  Skin Assessment: Reviewed RN Assessment  Last BM:  7/13  Height:   Ht Readings from Last 1 Encounters:   07/14/21 '5\' 8"'$  (1.727 m)    Weight:   Wt Readings from Last 1 Encounters:  07/14/21 104.3 kg    BMI:  Body mass index is 34.96 kg/m.  Estimated Nutritional Needs:   Kcal:  1600-1800  Protein:  85-95g  Fluid:  1.8L/day  Clayton Bibles, MS, RD, LDN Inpatient Clinical Dietitian Contact information available via Amion

## 2021-07-15 NOTE — TOC Initial Note (Signed)
Transition of Care [facility]) - Initial/Assessment Note   Patient Details  Name: Veronica Bradford MRN: [id number] Date of Birth: 24-Nov-1945  [facility] CM/SW Contact:    Sherie Don, LCSW Phone Number: 07/15/2021, 9:24 AM  Clinical Narrative: [facility] consulted for HH/DME needs. Patient is scheduled for surgery on 07/18/21. TOC awaiting PT evaluation after patient has surgery.  Expected Discharge Plan: [place] Barriers to Discharge: Continued Medical Work up  Expected Discharge Plan and Services Expected Discharge Plan: [place] In-house Referral: Clinical Social Work Living arrangements for the past 2 months: Single Family Home  Prior Living Arrangements/Services Living arrangements for the past 2 months: Single Family Home Lives with:: Spouse Patient language and need for interpreter reviewed:: Yes Do you feel safe going back to the place where you live?: Yes      Need for Family Participation in Patient Care: No (Comment) Care giver support system in place?: Yes (comment) Criminal Activity/Legal Involvement Pertinent to Current Situation/Hospitalization: No - Comment as needed  Activities of Daily Living Home Assistive Devices/Equipment: Cane (specify quad or straight) ADL Screening (condition at time of admission) Patient's cognitive ability adequate to safely complete daily activities?: Yes Is the patient deaf or have difficulty hearing?: No Does the patient have difficulty seeing, even when wearing glasses/contacts?: No Does the patient have difficulty concentrating, remembering, or making decisions?: No Patient able to express need for assistance with ADLs?: Yes Does the patient have difficulty dressing or bathing?: Yes Independently performs ADLs?: No Communication: Independent Dressing (OT): Needs assistance Is this a change from baseline?: Change from baseline, expected to last >3 days Grooming: Independent Feeding:  Independent Bathing: Needs assistance Is this a change from baseline?: Change from baseline, expected to last >3 days Toileting: Needs assistance Is this a change from baseline?: Change from baseline, expected to last >3days In/Out Bed: Needs assistance Is this a change from baseline?: Change from baseline, expected to last >3 days Walks in Home: Needs assistance Is this a change from baseline?: Change from baseline, expected to last >3 days Does the patient have difficulty walking or climbing stairs?: Yes Weakness of Legs: None Weakness of Arms/Hands: None  Emotional Assessment Orientation: : Oriented to Self, Oriented to Place, Oriented to  Time, Oriented to Situation Alcohol / Substance Use: Not Applicable Psych Involvement: No (comment)  Admission diagnosis:  Hip fracture ([place]) [S72.009A] Closed fracture of left hip, initial encounter ([place]) [S72.002A] Patient Active Problem List   Diagnosis Date Noted   Closed left hip fracture, initial encounter ([place]) 07/14/2021   Chronic diastolic CHF (congestive heart failure) ([place]) 07/14/2021   Pacemaker 12/14/2020   RBBB 08/09/2020   PVC's (premature ventricular contractions) 08/09/2020   Sinus bradycardia 08/09/2020   Nausea & vomiting 03/09/2020   Carpal tunnel syndrome, right 07/08/2019   Vitamin D deficiency 01/20/2019   Retinal edema 11/18/2018   Macular pucker, right eye 11/18/2018   Multiple pulmonary nodules determined by computed tomography of lung 12/06/2017   Paroxysmal atrial fibrillation ([place]) 09/04/2017   Osteopenia 05/23/2017   Annual physical exam 04/14/2015   PCP NOTES >>>>> 12/23/2014   IBS (irritable bowel syndrome) 08/13/2014   Hyperglycemia    Chronic sinusitis    Endometrial cancer ([place])    Back pain-- on tylenol #3 prn    GERD (gastroesophageal reflux disease)    HTN (hypertension) 65/99/3570   Diastolic dysfunction-grade 2 09/09/2013   Chronic anticoagulation 09/09/2013   Edema of both legs 09/09/2013  Dyslipidemia 09/09/2013   Obesity (BMI 30-39.9) 09/09/2013   Sleep apnea-- Dr Maxwell Caul, on CPAP 09/09/2013   PCP:  Colon Branch, MD Pharmacy:   [facility] [place] [place] [address] Phone: [phone] Fax: [phone]  [facility] [id number] - [place] [address] [place] [address] Phone: [phone] Fax: [phone]  Readmission Risk Interventions     No data to display

## 2021-07-15 NOTE — Progress Notes (Signed)
  Progress Note   Patient: Veronica Bradford GBT:[id number] DOB: September 03, 1945 DOA: 07/14/2021     1 DOS: the patient was seen and examined on 07/15/2021   Brief hospital course:  76 y.o. female with medical history significant for hypertension, atrial fibrillation on Xarelto, tachybradycardia syndrome with pacer, and BMI 35, who presented to the emergency department with severe left hip pain after a fall.  Patient reports that she was in her usual state of health and having an uneventful day when she tripped and fell directly onto her left hip.  She experienced severe pain when she tried to bear weight and suspected she had suffered a fracture.  Patient reports that she walks 1.5 to 2 miles daily and never experiences shortness of breath or chest pain with that.  She had a nuclear stress test in August 2021 that was a low risk, normal study.   Upon arrival to the ED, patient is found to be afebrile and saturating well on room air with stable blood pressure.  EKG features sinus rhythm with first-degree AV nodal block, RBBB, LAFB, and LVH with repolarization abnormality.  Chest x-ray negative for acute cardiopulmonary disease.  Plain films of the hip and pelvis demonstrate impacted subcapital left femoral neck fracture with minimal displacement.  Orthopedic surgery was consulted  Assessment and Plan: 1. Left hip fracture  - Presents with severe left hip pain after trip and fall directly onto the hip; found to have subcapital left femur fracture  - Orthopedic Surgery following - Xarelto now on hold.  -Plan for surgery 7/17   2. PAF  - Maintaining SR on flecainide  - Hold Xarelto (last dose pm of 7/12), bridge with Lovenox, continue flecainide   -Remains rate controlled   3. Chronic HFpEF  - EF was preserved on TTE from February 2023  - Appears compensated  - Monitor volume status     4. Hypokalemia  - Replaced  -cont follow bmet trends   5. OSA  - Continue CPAP qHS         Subjective:  Without complaints this afternoon  Physical Exam: Vitals:   07/15/21 0627 07/15/21 0937 07/15/21 0956 07/15/21 1344  BP: 120/63 (!) 135/59  (!) 155/75  Pulse: 61 (!) 59 64 66  Resp: 18  17   Temp: 97.7 F (36.5 C)  97.7 F (36.5 C) 98.8 F (37.1 C)  TempSrc: Oral  Oral Oral  SpO2: 99%  98% 97%  Weight:      Height:       General exam: Awake, laying in bed, in nad Respiratory system: Normal respiratory effort, no wheezing Cardiovascular system: regular rate, s1, s2 Gastrointestinal system: Soft, nondistended, positive BS Central nervous system: CN2-12 grossly intact, strength intact Extremities: Perfused, no clubbing Skin: Normal skin turgor, no notable skin lesions seen Psychiatry: Mood normal // no visual hallucinations   Data Reviewed:  Labs reviewed: K 3.7, Cr 0.63  Family Communication: Pt in room, family not at bedside  Disposition: Status is: Inpatient Remains inpatient appropriate because: Severity of illness  Planned Discharge Destination: Home    Author: Marylu Lund, MD 07/15/2021 4:29 PM  For on call review [email or web address].

## 2021-07-16 DIAGNOSIS — S72002A Fracture of unspecified part of neck of left femur, initial encounter for closed fracture: Secondary | ICD-10-CM | POA: Diagnosis not present

## 2021-07-16 LAB — SURGICAL PCR SCREEN
MRSA, PCR: NEGATIVE
Staphylococcus aureus: POSITIVE — AB

## 2021-07-16 LAB — CBC
HCT: 39.4 % (ref 36.0–46.0)
Hemoglobin: 12.4 g/dL (ref 12.0–15.0)
MCH: 30 pg (ref 26.0–34.0)
MCHC: 31.5 g/dL (ref 30.0–36.0)
MCV: 95.4 fL (ref 80.0–100.0)
Platelets: 173 10*3/uL (ref 150–400)
RBC: 4.13 MIL/uL (ref 3.87–5.11)
RDW: 14.7 % (ref 11.5–15.5)
WBC: 6.5 10*3/uL (ref 4.0–10.5)
nRBC: 0 % (ref 0.0–0.2)

## 2021-07-16 LAB — BASIC METABOLIC PANEL
Anion gap: 7 (ref 5–15)
BUN: 18 mg/dL (ref 8–23)
CO2: 25 mmol/L (ref 22–32)
Calcium: 10.3 mg/dL (ref 8.9–10.3)
Chloride: 108 mmol/L (ref 98–111)
Creatinine, Ser: 0.58 mg/dL (ref 0.44–1.00)
GFR, Estimated: >60 mL/min (ref >60)
Glucose, Bld: 116 mg/dL — ABNORMAL HIGH (ref 70–99)
Potassium: 3.9 mmol/L (ref 3.5–5.1)
Sodium: 140 mmol/L (ref 135–145)

## 2021-07-16 MED ORDER — ACETAMINOPHEN 325 MG PO TABS
650.0000 mg | ORAL_TABLET | Freq: Four times a day (QID) | ORAL | Status: DC | PRN
  Administered 2021-07-16: 650 mg via ORAL
  Filled 2021-07-16: qty 2

## 2021-07-16 MED ORDER — HYDROCODONE-ACETAMINOPHEN 5-325 MG PO TABS
1.0000 | ORAL_TABLET | ORAL | Status: DC | PRN
  Administered 2021-07-16 – 2021-07-17 (×3): 1 via ORAL
  Administered 2021-07-18: 2 via ORAL
  Administered 2021-07-19: 1 via ORAL
  Administered 2021-07-19 (×2): 2 via ORAL
  Administered 2021-07-19: 1 via ORAL
  Administered 2021-07-20 – 2021-07-21 (×7): 2 via ORAL
  Filled 2021-07-16: qty 1
  Filled 2021-07-16 (×2): qty 2
  Filled 2021-07-16 (×2): qty 1
  Filled 2021-07-16 (×3): qty 2
  Filled 2021-07-16: qty 1
  Filled 2021-07-16 (×3): qty 2
  Filled 2021-07-16: qty 1
  Filled 2021-07-16 (×2): qty 2

## 2021-07-16 MED ORDER — OXYCODONE HCL 5 MG PO TABS: 5.0000 mg | ORAL_TABLET | Freq: Four times a day (QID) | ORAL | Status: DC | PRN

## 2021-07-16 NOTE — Plan of Care (Signed)

## 2021-07-16 NOTE — Plan of Care (Signed)
  Problem: Education: Goal: Knowledge of General Education information will improve Description Including pain rating scale, medication(s)/side effects and non-pharmacologic comfort measures Outcome: Progressing   

## 2021-07-16 NOTE — Progress Notes (Signed)
  Progress Note   Patient: Veronica Bradford:[id number] DOB: [birth year] DOA: 07/14/2021     2 DOS: the patient was seen and examined on 07/16/2021   Brief hospital course:  76 y.o. female with medical history significant for hypertension, atrial fibrillation on Xarelto, tachybradycardia syndrome with pacer, and BMI 35, who presented to the emergency department with severe left hip pain after a fall.  Patient reports that she was in her usual state of health and having an uneventful day when she tripped and fell directly onto her left hip.  She experienced severe pain when she tried to bear weight and suspected she had suffered a fracture.  Patient reports that she walks 1.5 to 2 miles daily and never experiences shortness of breath or chest pain with that.  She had a nuclear stress test in August 2021 that was a low risk, normal study.   Upon arrival to the ED, patient is found to be afebrile and saturating well on room air with stable blood pressure.  EKG features sinus rhythm with first-degree AV nodal block, RBBB, LAFB, and LVH with repolarization abnormality.  Chest x-ray negative for acute cardiopulmonary disease.  Plain films of the hip and pelvis demonstrate impacted subcapital left femoral neck fracture with minimal displacement.  Orthopedic surgery was consulted  Assessment and Plan: 1. Left hip fracture  - Presents with severe left hip pain after trip and fall directly onto the hip; found to have subcapital left femur fracture  - Orthopedic Surgery following - Xarelto remains on hold.  -Plan for surgery 7/17   2. PAF  - Maintaining SR on flecainide  - Hold Xarelto (last dose pm of 7/12), bridge with Lovenox, continue flecainide   -Remains rate controlled   3. Chronic HFpEF  - EF was preserved on TTE from February 2023  - Remains compensated - Monitor volume status     4. Hypokalemia  - Replaced  -cont follow bmet trends   5. OSA  - Continue CPAP qHS         Subjective:  Complains of residual pains, otherwise without complaints  Physical Exam: Vitals:   07/15/21 2057 07/16/21 0447 07/16/21 0948 07/16/21 1359  BP: (!) 125/53 (!) 146/59 (!) 148/82 (!) 152/67  Pulse: (!) 59 63 (!) 59 62  Resp: '16 16  18  '$ Temp: 98.1 F (36.7 C) 98.3 F (36.8 C)  97.9 F (36.6 C)  TempSrc: Oral Oral  Oral  SpO2: 99% 96%  99%  Weight:      Height:       General exam: Conversant, in no acute distress Respiratory system: normal chest rise, clear, no audible wheezing Cardiovascular system: regular rhythm, s1-s2 Gastrointestinal system: Nondistended, nontender, pos BS Central nervous system: No seizures, no tremors Extremities: No cyanosis, no joint deformities Skin: No rashes, no pallor Psychiatry: Affect normal // no auditory hallucinations   Data Reviewed:  Labs reviewed: Na 140, K 3.9, Cr 0.58  Family Communication: Pt in room, family not at bedside  Disposition: Status is: Inpatient Remains inpatient appropriate because: Severity of illness  Planned Discharge Destination: Home    Author: Marylu Lund, MD 07/16/2021 4:10 PM  For on call review [email or web address].

## 2021-07-17 ENCOUNTER — Encounter (HOSPITAL_COMMUNITY): Payer: Self-pay | Admitting: Family Medicine

## 2021-07-17 DIAGNOSIS — S72002A Fracture of unspecified part of neck of left femur, initial encounter for closed fracture: Secondary | ICD-10-CM | POA: Diagnosis not present

## 2021-07-17 LAB — CBC
HCT: 37.5 % (ref 36.0–46.0)
Hemoglobin: 12.2 g/dL (ref 12.0–15.0)
MCH: 30.1 pg (ref 26.0–34.0)
MCHC: 32.5 g/dL (ref 30.0–36.0)
MCV: 92.6 fL (ref 80.0–100.0)
Platelets: 179 10*3/uL (ref 150–400)
RBC: 4.05 MIL/uL (ref 3.87–5.11)
RDW: 14.6 % (ref 11.5–15.5)
WBC: 6.3 10*3/uL (ref 4.0–10.5)
nRBC: 0 % (ref 0.0–0.2)

## 2021-07-17 LAB — BASIC METABOLIC PANEL
Anion gap: 4 — ABNORMAL LOW (ref 5–15)
BUN: 21 mg/dL (ref 8–23)
CO2: 27 mmol/L (ref 22–32)
Calcium: 10.1 mg/dL (ref 8.9–10.3)
Chloride: 109 mmol/L (ref 98–111)
Creatinine, Ser: 0.64 mg/dL (ref 0.44–1.00)
GFR, Estimated: >60 mL/min (ref >60)
Glucose, Bld: 112 mg/dL — ABNORMAL HIGH (ref 70–99)
Potassium: 3.7 mmol/L (ref 3.5–5.1)
Sodium: 140 mmol/L (ref 135–145)

## 2021-07-17 NOTE — Plan of Care (Signed)
  Problem: Education: Goal: Knowledge of General Education information will improve Description Including pain rating scale, medication(s)/side effects and non-pharmacologic comfort measures Outcome: Progressing   

## 2021-07-17 NOTE — Progress Notes (Addendum)
Progress Note    Veronica Bradford  OBS:[id number] DOB: Sep 25, 1945  DOA: 07/14/2021 PCP: Colon Branch, MD      Brief Narrative:    Medical records reviewed and are as summarized below:    77 y.o. female with medical history significant for hypertension, atrial fibrillation on Xarelto, tachybradycardia syndrome with pacer, and BMI 35, who presented to the emergency department with severe left hip pain after a fall.  Patient reports that she was in her usual state of health and having an uneventful day when she tripped and fell directly onto her left hip.  She experienced severe pain when she tried to bear weight and suspected she had suffered a fracture.  Patient reports that she walks 1.5 to 2 miles daily and never experiences shortness of breath or chest pain with that.  She had a nuclear stress test in August 2021 that was a low risk, normal study.   Upon arrival to the ED, patient is found to be afebrile and saturating well on room air with stable blood pressure.  EKG features sinus rhythm with first-degree AV nodal block, RBBB, LAFB, and LVH with repolarization abnormality.  Chest x-ray negative for acute cardiopulmonary disease.  Plain films of the hip and pelvis demonstrate impacted subcapital left femoral neck fracture with minimal displacement.  Orthopedic surgery was consulted      Assessment/Plan:   Principal Problem:   Closed left hip fracture, initial encounter [facility]) Active Problems:   Sleep apnea-- Dr Maxwell Caul, on CPAP   Paroxysmal atrial fibrillation (HCC)   Chronic diastolic CHF (congestive heart failure) (HCC)   Nutrition Problem: Increased nutrient needs Etiology: hip fracture  Signs/Symptoms: estimated needs   Body mass index is 34.96 kg/m.  (Obesity)   1. Left hip fracture, s/p mechanical fall Continue analgesics as needed for pain.  Plan for left hip surgical repair on 07/18/2021.   2. PAF  Continue flecainide and Cardizem.  Xarelto is on hold.     3. Chronic HFpEF  Compensated.  Preserved EF on echo in February 2023.  4. Hypokalemia  Improved   5. OSA  - Continue CPAP qHS      Diet Order             Diet NPO time specified  Diet effective midnight           Diet Heart Room service appropriate? Yes; Fluid consistency: Thin  Diet effective now                            Consultants: Surgeon  Procedures: None    Medications:    diltiazem  120 mg Oral Daily   flecainide  100 mg Oral BID   pantoprazole  40 mg Oral Daily   pravastatin  40 mg Oral q1800   Ensure Max Protein  11 oz Oral Daily   triamterene-hydrochlorothiazide  0.5 tablet Oral Daily   Continuous Infusions:  methocarbamol (ROBAXIN) IV       Anti-infectives (From admission, onward)    None              Family Communication/Anticipated D/C date and plan/Code Status   DVT prophylaxis:      Code Status: Full Code  Family Communication: None Disposition Plan: To be determined after surgery   Status is: Inpatient Remains inpatient appropriate because: Awaiting left hip surgery on 07/18/2021       Subjective:   Interval events noted.  She complains of pain in the left hip.  No shortness of breath, chest pain.  Objective:    Vitals:   07/16/21 2220 07/17/21 0412 07/17/21 0814 07/17/21 1339  BP: (!) 143/67 (!) 145/62 (!) 155/76 118/81  Pulse: 63 61 61 72  Resp: '18 18  17  '$ Temp: 98.4 F (36.9 C) 97.7 F (36.5 C)  97.7 F (36.5 C)  TempSrc: Oral Oral    SpO2: 96% 96%  100%  Weight:      Height:       No data found.   Intake/Output Summary (Last 24 hours) at 07/17/2021 1402 Last data filed at 07/17/2021 1052 Gross per 24 hour  Intake 580 ml  Output --  Net 580 ml   Filed Weights   07/14/21 2118  Weight: 104.3 kg    Exam:  GEN: NAD SKIN: Warm and dry EYES: EOMI ENT: MMM CV: RRR PULM: CTA B ABD: soft, obese, NT, +BS CNS: AAO x 3, non focal EXT: No edema or tenderness         Data Reviewed:   I have personally reviewed following labs and imaging studies:  Labs: Labs show the following:   Basic Metabolic Panel: Recent Labs  Lab 07/14/21 1515 07/15/21 0418 07/16/21 0328 07/17/21 0329  NA 140 143 140 140  K 3.3* 3.7 3.9 3.7  CL 104 112* 108 109  CO2 '28 28 25 27  '$ GLUCOSE 99 108* 116* 112*  BUN '17 17 18 21  '$ CREATININE 0.77 0.63 0.58 0.64  CALCIUM 10.5* 10.2 10.3 10.1  MG  --  2.2  --   --    GFR Estimated Creatinine Clearance: 79.2 mL/min (by C-G formula based on SCr of 0.64 mg/dL). Liver Function Tests: No results for input(s): "AST", "ALT", "ALKPHOS", "BILITOT", "PROT", "ALBUMIN" in the last 168 hours. No results for input(s): "LIPASE", "AMYLASE" in the last 168 hours. No results for input(s): "AMMONIA" in the last 168 hours. Coagulation profile Recent Labs  Lab 07/14/21 1515  INR 1.2    CBC: Recent Labs  Lab 07/14/21 1515 07/15/21 0418 07/16/21 0328 07/17/21 0329  WBC 8.1 6.7 6.5 6.3  NEUTROABS 6.0  --   --   --   HGB 14.3 12.3 12.4 12.2  HCT 42.8 37.5 39.4 37.5  MCV 90.1 92.1 95.4 92.6  PLT 210 193 173 179   Cardiac Enzymes: No results for input(s): "CKTOTAL", "CKMB", "CKMBINDEX", "TROPONINI" in the last 168 hours. BNP (last 3 results) No results for input(s): "PROBNP" in the last 8760 hours. CBG: No results for input(s): "GLUCAP" in the last 168 hours. D-Dimer: No results for input(s): "DDIMER" in the last 72 hours. Hgb A1c: No results for input(s): "HGBA1C" in the last 72 hours. Lipid Profile: No results for input(s): "CHOL", "HDL", "LDLCALC", "TRIG", "CHOLHDL", "LDLDIRECT" in the last 72 hours. Thyroid function studies: No results for input(s): "TSH", "T4TOTAL", "T3FREE", "THYROIDAB" in the last 72 hours.  Invalid input(s): "FREET3" Anemia work up: No results for input(s): "VITAMINB12", "FOLATE", "FERRITIN", "TIBC", "IRON", "RETICCTPCT" in the last 72 hours. Sepsis Labs: Recent Labs  Lab 07/14/21 1515  07/15/21 0418 07/16/21 0328 07/17/21 0329  WBC 8.1 6.7 6.5 6.3    Microbiology Recent Results (from the past 240 hour(s))  Surgical pcr screen     Status: Abnormal   Collection Time: 07/16/21  6:22 AM   Specimen: Nasal Mucosa; Nasal Swab  Result Value Ref Range Status   MRSA, PCR NEGATIVE NEGATIVE Final   Staphylococcus aureus POSITIVE (A)  NEGATIVE Final    Comment: (NOTE) The Xpert SA Assay (FDA approved for NASAL specimens in patients 13 years of age and older), is one component of a comprehensive surveillance program. It is not intended to diagnose infection nor to guide or monitor treatment. Performed at [facility], [place] [address] [place], [place] [address]     Procedures and diagnostic studies:  No results found.             LOS: 3 days   Alton Bouknight  [facility]   Pager on [email or web address]. If 7PM-7AM, please contact night-coverage at [email or web address]     07/17/2021, 2:02 PM

## 2021-07-17 NOTE — Plan of Care (Signed)
  Problem: Education: Goal: Knowledge of General Education information will improve Description: Including pain rating scale, medication(s)/side effects and non-pharmacologic comfort measures Outcome: Progressing   Problem: Activity: Goal: Risk for activity intolerance will decrease Outcome: Progressing   Problem: Nutrition: Goal: Adequate nutrition will be maintained Outcome: Progressing   

## 2021-07-17 NOTE — Progress Notes (Signed)
ANTICOAGULATION CONSULT NOTE   Pharmacy Consult for enoxaparin Indication: atrial fibrillation  Allergies  Allergen Reactions   Ibuprofen Other (See Comments)    Pt is on Xarelto   Lactose Intolerance (Gi) Diarrhea and Nausea And Vomiting   Lisinopril Cough   Nsaids Other (See Comments)    Pt on Xarelto   Tape     Causes blisters    Tolmetin Other (See Comments)    Pt on Xarelto   Zoster Vaccine Live Other (See Comments)    Serum sickness, high fever, neck pain    Amoxicillin Rash   Penicillins Rash    Local reaction    Patient Measurements: Height: '5\' 8"'$  (172.7 cm) Weight: 104.3 kg (229 lb 15 oz) IBW/kg (Calculated) : 63.9  Vital Signs: Temp: 97.7 F (36.5 C) (07/16 0412) Temp Source: Oral (07/16 0412) BP: 155/76 (07/16 0814) Pulse Rate: 61 (07/16 0814)  Labs: Recent Labs    07/14/21 1515 07/15/21 0418 07/16/21 0328 07/17/21 0329  HGB 14.3 12.3 12.4 12.2  HCT 42.8 37.5 39.4 37.5  PLT 210 193 173 179  LABPROT 15.1  --   --   --   INR 1.2  --   --   --   CREATININE 0.77 0.63 0.58 0.64     Estimated Creatinine Clearance: 79.2 mL/min (by C-G formula based on SCr of 0.64 mg/dL).   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 Caryl Comes, APAP     Assessment: 76 yo  female with L femoral neck fx after a ground level fall at home. PMH significant for history of atrial fibrillation on Xarelto PTA. Ortho consulted - planning for surgery on 7/17. Xarelto has been held since admission (last dose on 7/12 at 1900). Pharmacy was consulted to dose therapeutic enoxaparin through 7/15 with instructions for the last dose pre-op to be given on 7/15 PM.   Today, 07/17/21 CBC WNL Renal function WNL and stable   Plan:  Enoxaparin course has been completed. Last dose of enoxaparin 100 mg given on 7/15 at 2133.   Pharmacy to sign off.   Lenis Noon, PharmD 07/17/21 9:06 AM

## 2021-07-18 ENCOUNTER — Inpatient Hospital Stay (HOSPITAL_COMMUNITY): Payer: Medicare Other

## 2021-07-18 ENCOUNTER — Inpatient Hospital Stay (HOSPITAL_COMMUNITY): Payer: Medicare Other | Admitting: Anesthesiology

## 2021-07-18 ENCOUNTER — Encounter (HOSPITAL_COMMUNITY)
Admission: EM | Disposition: A | Discharge status: Home Health | Payer: Self-pay | Source: Home / Self Care | Attending: Internal Medicine

## 2021-07-18 ENCOUNTER — Encounter (HOSPITAL_COMMUNITY): Payer: Self-pay | Admitting: Family Medicine

## 2021-07-18 ENCOUNTER — Inpatient Hospital Stay (HOSPITAL_COMMUNITY): Admission: RE | Admit: 2021-07-18 | Payer: Medicare Other | Source: Home / Self Care | Admitting: Orthopedic Surgery

## 2021-07-18 DIAGNOSIS — Z87891 Personal history of nicotine dependence: Secondary | ICD-10-CM

## 2021-07-18 DIAGNOSIS — S72002A Fracture of unspecified part of neck of left femur, initial encounter for closed fracture: Secondary | ICD-10-CM

## 2021-07-18 DIAGNOSIS — I10 Essential (primary) hypertension: Secondary | ICD-10-CM

## 2021-07-18 DIAGNOSIS — G4730 Sleep apnea, unspecified: Secondary | ICD-10-CM

## 2021-07-18 HISTORY — PX: TOTAL HIP ARTHROPLASTY: SHX124

## 2021-07-18 LAB — TYPE AND SCREEN
ABO/RH(D): A POS
Antibody Screen: NEGATIVE

## 2021-07-18 SURGERY — ARTHROPLASTY, HIP, TOTAL, ANTERIOR APPROACH
Anesthesia: Spinal | Site: Hip | Laterality: Left

## 2021-07-18 MED ORDER — BUPIVACAINE IN DEXTROSE 0.75-8.25 % IT SOLN
INTRATHECAL | Status: DC | PRN
  Administered 2021-07-18: 1.8 mL via INTRATHECAL

## 2021-07-18 MED ORDER — ALUM & MAG HYDROXIDE-SIMETH 200-200-20 MG/5ML PO SUSP: 30.0000 mL | ORAL | Status: DC | PRN

## 2021-07-18 MED ORDER — FENTANYL CITRATE PF 50 MCG/ML IJ SOSY: 25.0000 ug | PREFILLED_SYRINGE | INTRAMUSCULAR | Status: DC | PRN

## 2021-07-18 MED ORDER — PHENYLEPHRINE HCL (PRESSORS) 10 MG/ML IV SOLN
INTRAVENOUS | Status: AC
  Filled 2021-07-18: qty 1

## 2021-07-18 MED ORDER — TRANEXAMIC ACID-NACL 1000-0.7 MG/100ML-% IV SOLN
1000.0000 mg | INTRAVENOUS | Status: AC
  Administered 2021-07-18: 1000 mg via INTRAVENOUS
  Filled 2021-07-18: qty 100

## 2021-07-18 MED ORDER — MENTHOL 3 MG MT LOZG: 1.0000 | LOZENGE | OROMUCOSAL | Status: DC | PRN

## 2021-07-18 MED ORDER — POLYETHYLENE GLYCOL 3350 17 G PO PACK: 17.0000 g | PACK | Freq: Every day | ORAL | Status: DC | PRN

## 2021-07-18 MED ORDER — BUPIVACAINE-EPINEPHRINE (PF) 0.25% -1:200000 IJ SOLN
INTRAMUSCULAR | Status: AC
  Filled 2021-07-18: qty 30

## 2021-07-18 MED ORDER — ONDANSETRON HCL 4 MG PO TABS: 4.0000 mg | ORAL_TABLET | Freq: Four times a day (QID) | ORAL | Status: DC | PRN

## 2021-07-18 MED ORDER — DIPHENHYDRAMINE HCL 12.5 MG/5ML PO ELIX: 12.5000 mg | ORAL_SOLUTION | ORAL | Status: DC | PRN

## 2021-07-18 MED ORDER — CHLORHEXIDINE GLUCONATE 0.12 % MT SOLN
15.0000 mL | Freq: Once | OROMUCOSAL | Status: AC
  Administered 2021-07-18: 15 mL via OROMUCOSAL

## 2021-07-18 MED ORDER — PHENYLEPHRINE HCL-NACL 20-0.9 MG/250ML-% IV SOLN
INTRAVENOUS | Status: DC | PRN
  Administered 2021-07-18: 40 ug/min via INTRAVENOUS

## 2021-07-18 MED ORDER — AMISULPRIDE (ANTIEMETIC) 5 MG/2ML IV SOLN: 10.0000 mg | Freq: Once | INTRAVENOUS | Status: DC | PRN

## 2021-07-18 MED ORDER — CEFAZOLIN SODIUM-DEXTROSE 2-4 GM/100ML-% IV SOLN
2.0000 g | INTRAVENOUS | Status: AC
  Administered 2021-07-18: 2 g via INTRAVENOUS
  Filled 2021-07-18: qty 100

## 2021-07-18 MED ORDER — PHENOL 1.4 % MT LIQD: 1.0000 | OROMUCOSAL | Status: DC | PRN

## 2021-07-18 MED ORDER — ORAL CARE MOUTH RINSE
15.0000 mL | Freq: Once | OROMUCOSAL | Status: AC
Start: 2021-07-18 — End: 2021-07-18

## 2021-07-18 MED ORDER — METOCLOPRAMIDE HCL 5 MG/ML IJ SOLN: 5.0000 mg | Freq: Three times a day (TID) | INTRAMUSCULAR | Status: DC | PRN

## 2021-07-18 MED ORDER — FENTANYL CITRATE (PF) 100 MCG/2ML IJ SOLN
INTRAMUSCULAR | Status: DC | PRN
  Administered 2021-07-18: 50 ug via INTRAVENOUS
  Administered 2021-07-18 (×2): 25 ug via INTRAVENOUS

## 2021-07-18 MED ORDER — LACTATED RINGERS IV SOLN: INTRAVENOUS | Status: DC

## 2021-07-18 MED ORDER — DEXAMETHASONE SODIUM PHOSPHATE 10 MG/ML IJ SOLN
INTRAMUSCULAR | Status: DC | PRN
  Administered 2021-07-18: 5 mg via INTRAVENOUS

## 2021-07-18 MED ORDER — FENTANYL CITRATE (PF) 100 MCG/2ML IJ SOLN
INTRAMUSCULAR | Status: AC
  Filled 2021-07-18: qty 2

## 2021-07-18 MED ORDER — KETOROLAC TROMETHAMINE 30 MG/ML IJ SOLN
INTRAMUSCULAR | Status: AC
  Filled 2021-07-18: qty 1

## 2021-07-18 MED ORDER — RIVAROXABAN 10 MG PO TABS: 10.0000 mg | ORAL_TABLET | Freq: Every day | ORAL | Status: DC

## 2021-07-18 MED ORDER — SODIUM CHLORIDE 0.9 % IV SOLN: INTRAVENOUS | Status: DC

## 2021-07-18 MED ORDER — WATER FOR IRRIGATION, STERILE IR SOLN
Status: DC | PRN
  Administered 2021-07-18: 2000 mL

## 2021-07-18 MED ORDER — ONDANSETRON HCL 4 MG/2ML IJ SOLN
INTRAMUSCULAR | Status: DC | PRN
  Administered 2021-07-18: 4 mg via INTRAVENOUS

## 2021-07-18 MED ORDER — BUPIVACAINE-EPINEPHRINE 0.25% -1:200000 IJ SOLN
INTRAMUSCULAR | Status: DC | PRN
  Administered 2021-07-18: 30 mL

## 2021-07-18 MED ORDER — KETOROLAC TROMETHAMINE 30 MG/ML IJ SOLN
INTRAMUSCULAR | Status: DC | PRN
  Administered 2021-07-18: 30 mg

## 2021-07-18 MED ORDER — ISOPROPYL ALCOHOL 70 % SOLN
Status: DC | PRN
  Administered 2021-07-18: 1 via TOPICAL

## 2021-07-18 MED ORDER — ACETAMINOPHEN 500 MG PO TABS
1000.0000 mg | ORAL_TABLET | Freq: Once | ORAL | Status: AC
  Administered 2021-07-18: 1000 mg via ORAL

## 2021-07-18 MED ORDER — SODIUM CHLORIDE (PF) 0.9 % IJ SOLN
INTRAMUSCULAR | Status: AC
  Filled 2021-07-18: qty 50

## 2021-07-18 MED ORDER — DOCUSATE SODIUM 100 MG PO CAPS
100.0000 mg | ORAL_CAPSULE | Freq: Two times a day (BID) | ORAL | Status: DC
  Administered 2021-07-18 – 2021-07-21 (×6): 100 mg via ORAL
  Filled 2021-07-18 (×6): qty 1

## 2021-07-18 MED ORDER — ONDANSETRON HCL 4 MG/2ML IJ SOLN: 4.0000 mg | Freq: Four times a day (QID) | INTRAMUSCULAR | Status: DC | PRN

## 2021-07-18 MED ORDER — POVIDONE-IODINE 10 % EX SWAB
2.0000 | Freq: Once | CUTANEOUS | Status: AC
  Administered 2021-07-18: 2 via TOPICAL

## 2021-07-18 MED ORDER — SODIUM CHLORIDE 0.9 % IR SOLN
Status: DC | PRN
  Administered 2021-07-18: 1000 mL
  Administered 2021-07-18: 3000 mL

## 2021-07-18 MED ORDER — ACETAMINOPHEN 500 MG PO TABS
ORAL_TABLET | ORAL | Status: AC
  Filled 2021-07-18: qty 2

## 2021-07-18 MED ORDER — CHLORHEXIDINE GLUCONATE 4 % EX LIQD: 60.0000 mL | Freq: Once | CUTANEOUS | Status: DC

## 2021-07-18 MED ORDER — PROPOFOL 500 MG/50ML IV EMUL
INTRAVENOUS | Status: DC | PRN
  Administered 2021-07-18: 80 ug/kg/min via INTRAVENOUS

## 2021-07-18 MED ORDER — METOCLOPRAMIDE HCL 5 MG PO TABS: 5.0000 mg | ORAL_TABLET | Freq: Three times a day (TID) | ORAL | Status: DC | PRN

## 2021-07-18 MED ORDER — PROMETHAZINE HCL 25 MG/ML IJ SOLN: 6.2500 mg | INTRAMUSCULAR | Status: DC | PRN

## 2021-07-18 MED ORDER — SODIUM CHLORIDE (PF) 0.9 % IJ SOLN
INTRAMUSCULAR | Status: DC | PRN
  Administered 2021-07-18: 30 mL

## 2021-07-18 MED ORDER — CHLORHEXIDINE GLUCONATE CLOTH 2 % EX PADS: 6.0000 | MEDICATED_PAD | Freq: Every day | CUTANEOUS | Status: DC

## 2021-07-18 MED ORDER — CEFAZOLIN SODIUM-DEXTROSE 2-4 GM/100ML-% IV SOLN
2.0000 g | Freq: Four times a day (QID) | INTRAVENOUS | Status: AC
  Administered 2021-07-18 – 2021-07-19 (×2): 2 g via INTRAVENOUS
  Filled 2021-07-18 (×2): qty 100

## 2021-07-18 MED ORDER — PROPOFOL 10 MG/ML IV BOLUS
INTRAVENOUS | Status: DC | PRN
  Administered 2021-07-18: 40 mg via INTRAVENOUS
  Administered 2021-07-18 (×2): 15 mg via INTRAVENOUS

## 2021-07-18 MED ORDER — SENNA 8.6 MG PO TABS
1.0000 | ORAL_TABLET | Freq: Two times a day (BID) | ORAL | Status: DC
  Administered 2021-07-19 – 2021-07-21 (×4): 8.6 mg via ORAL
  Filled 2021-07-18 (×5): qty 1

## 2021-07-18 SURGICAL SUPPLY — 65 items
ACE SHELL 3H 52 E HIP (Shell) ×2 IMPLANT
ADH SKN CLS APL DERMABOND .7 (GAUZE/BANDAGES/DRESSINGS) ×1
APL PRP STRL LF DISP 70% ISPRP (MISCELLANEOUS) ×1
BAG COUNTER SPONGE SURGICOUNT (BAG) IMPLANT
BAG DECANTER FOR FLEXI CONT (MISCELLANEOUS) IMPLANT
BAG SPEC THK2 15X12 ZIP CLS (MISCELLANEOUS)
BAG SPNG CNTER NS LX DISP (BAG)
BAG ZIPLOCK 12X15 (MISCELLANEOUS) IMPLANT
CHLORAPREP W/TINT 26 (MISCELLANEOUS) ×2 IMPLANT
COVER PERINEAL POST (MISCELLANEOUS) ×2 IMPLANT
COVER SURGICAL LIGHT HANDLE (MISCELLANEOUS) ×2 IMPLANT
DERMABOND ADVANCED (GAUZE/BANDAGES/DRESSINGS) ×1
DERMABOND ADVANCED .7 DNX12 (GAUZE/BANDAGES/DRESSINGS) ×2 IMPLANT
DRAPE IMP U-DRAPE 54X76 (DRAPES) ×2 IMPLANT
DRAPE SHEET LG 3/4 BI-LAMINATE (DRAPES) ×6 IMPLANT
DRAPE STERI IOBAN 125X83 (DRAPES) ×2 IMPLANT
DRAPE U-SHAPE 47X51 STRL (DRAPES) ×4 IMPLANT
DRESSING AQUACEL AG SP 3.5X10 (GAUZE/BANDAGES/DRESSINGS) IMPLANT
DRSG AQUACEL AG ADV 3.5X10 (GAUZE/BANDAGES/DRESSINGS) ×2 IMPLANT
DRSG AQUACEL AG SP 3.5X10 (GAUZE/BANDAGES/DRESSINGS) ×2
ELECT REM PT RETURN 15FT ADLT (MISCELLANEOUS) ×2 IMPLANT
GAUZE SPONGE 4X4 12PLY STRL (GAUZE/BANDAGES/DRESSINGS) ×2 IMPLANT
GLOVE BIO SURGEON STRL SZ8.5 (GLOVE) ×4 IMPLANT
GLOVE BIOGEL M 7.0 STRL (GLOVE) ×2 IMPLANT
GLOVE BIOGEL PI IND STRL 7.5 (GLOVE) ×1 IMPLANT
GLOVE BIOGEL PI IND STRL 8.5 (GLOVE) ×1 IMPLANT
GLOVE BIOGEL PI INDICATOR 7.5 (GLOVE) ×1
GLOVE BIOGEL PI INDICATOR 8.5 (GLOVE) ×1
GLOVE SURG LX 7.5 STRW (GLOVE) ×2
GLOVE SURG LX STRL 7.5 STRW (GLOVE) ×2 IMPLANT
GOWN SPEC L3 XXLG W/TWL (GOWN DISPOSABLE) ×2 IMPLANT
GOWN STRL REUS W/ TWL XL LVL3 (GOWN DISPOSABLE) ×1 IMPLANT
GOWN STRL REUS W/TWL XL LVL3 (GOWN DISPOSABLE) ×2
HANDPIECE INTERPULSE COAX TIP (DISPOSABLE) ×2
HEAD CERAMIC BIOLOX 36 T1 STD (Head) ×1 IMPLANT
HOLDER FOLEY CATH W/STRAP (MISCELLANEOUS) ×2 IMPLANT
HOOD PEEL AWAY FLYTE STAYCOOL (MISCELLANEOUS) ×6 IMPLANT
KIT TURNOVER KIT A (KITS) IMPLANT
LINER ACE G7 HIGH 36 SZ E (Liner) ×1 IMPLANT
MANIFOLD NEPTUNE II (INSTRUMENTS) ×2 IMPLANT
MARKER SKIN DUAL TIP RULER LAB (MISCELLANEOUS) ×2 IMPLANT
NDL SAFETY ECLIPSE 18X1.5 (NEEDLE) ×1 IMPLANT
NDL SPNL 18GX3.5 QUINCKE PK (NEEDLE) ×1 IMPLANT
NEEDLE HYPO 18GX1.5 SHARP (NEEDLE) ×2
NEEDLE SPNL 18GX3.5 QUINCKE PK (NEEDLE) ×2 IMPLANT
PACK ANTERIOR HIP CUSTOM (KITS) ×2 IMPLANT
PENCIL SMOKE EVACUATOR (MISCELLANEOUS) IMPLANT
SAW OSC TIP CART 19.5X105X1.3 (SAW) ×2 IMPLANT
SEALER BIPOLAR AQUA 6.0 (INSTRUMENTS) ×2 IMPLANT
SET HNDPC FAN SPRY TIP SCT (DISPOSABLE) ×1 IMPLANT
SHELL ACETAB 3H 52 E HIP (Shell) IMPLANT
SOLUTION PRONTOSAN WOUND 350ML (IRRIGATION / IRRIGATOR) ×2 IMPLANT
SPIKE FLUID TRANSFER (MISCELLANEOUS) ×2 IMPLANT
STEM FEM CMTL 15X150 133D (Stem) ×1 IMPLANT
SUT MNCRL AB 3-0 PS2 18 (SUTURE) ×2 IMPLANT
SUT MON AB 2-0 CT1 36 (SUTURE) ×2 IMPLANT
SUT STRATAFIX PDO 1 14 VIOLET (SUTURE) ×2
SUT STRATFX PDO 1 14 VIOLET (SUTURE) ×1
SUT VIC AB 2-0 CT1 27 (SUTURE)
SUT VIC AB 2-0 CT1 TAPERPNT 27 (SUTURE) IMPLANT
SUTURE STRATFX PDO 1 14 VIOLET (SUTURE) ×1 IMPLANT
SYR 3ML LL SCALE MARK (SYRINGE) ×2 IMPLANT
TRAY FOLEY MTR SLVR 16FR STAT (SET/KITS/TRAYS/PACK) IMPLANT
TUBE SUCTION HIGH CAP CLEAR NV (SUCTIONS) ×2 IMPLANT
WATER STERILE IRR 1000ML POUR (IV SOLUTION) ×2 IMPLANT

## 2021-07-18 NOTE — Anesthesia Postprocedure Evaluation (Signed)
Anesthesia Post Note  Patient: Veronica Bradford  Procedure(s) Performed: TOTAL HIP ARTHROPLASTY ANTERIOR APPROACH (Left: Hip)     Patient location during evaluation: PACU Anesthesia Type: Spinal Level of consciousness: awake and alert Pain management: pain level controlled Vital Signs Assessment: post-procedure vital signs reviewed and stable Respiratory status: spontaneous breathing and respiratory function stable Cardiovascular status: blood pressure returned to baseline and stable Postop Assessment: spinal receding Anesthetic complications: no   No notable events documented.  Last Vitals:  Vitals:   07/18/21 1930 07/18/21 1945  BP:    Pulse: 63   Resp:  14  Temp: (!) 35.9 C (!) 36.1 C  SpO2: 100%     Last Pain:  Vitals:   07/18/21 1945  TempSrc:   PainSc: 0-No pain                 Mikhai Bienvenue DANIEL

## 2021-07-18 NOTE — Discharge Instructions (Signed)
? ?Dr. Annalyce Lanpher ?Joint Replacement Specialist ?[facility] ?[address] ?[place], [place] [address] ?[phone] ? ? ?TOTAL HIP REPLACEMENT POSTOPERATIVE DIRECTIONS ? ? ? ?Hip Rehabilitation, Guidelines Following Surgery  ? ?WEIGHT BEARING ?Weight bearing as tolerated with assist device (walker, cane, etc) as directed, use it as long as suggested by your surgeon or therapist, typically at least 4-6 weeks. ? ?The results of a hip operation are greatly improved after range of motion and muscle strengthening exercises. Follow all safety measures which are given to protect your hip. If any of these exercises cause increased pain or swelling in your joint, decrease the amount until you are comfortable again. Then slowly increase the exercises. Call your caregiver if you have problems or questions.  ? ?HOME CARE INSTRUCTIONS  ?Most of the following instructions are designed to prevent the dislocation of your new hip.  ?Remove items at home which could result in a fall. This includes throw rugs or furniture in walking pathways.  ?Continue medications as instructed at time of discharge. ?You may have some home medications which will be placed on hold until you complete the course of blood thinner medication. ?You may start showering once you are discharged home. Do not remove your dressing. ?Do not put on socks or shoes without following the instructions of your caregivers.   ?Sit on chairs with arms. Use the chair arms to help push yourself up when arising.  ?Arrange for the use of a toilet seat elevator so you are not sitting low.  ?Walk with walker as instructed.  ?You may resume a sexual relationship in one month or when given the OK by your caregiver.  ?Use walker as long as suggested by your caregivers.  ?You may put full weight on your legs and walk as much as is comfortable. ?Avoid periods of inactivity such as sitting longer than an hour when not asleep. This helps prevent blood  clots.  ?You may return to work once you are cleared by your surgeon.  ?Do not drive a car for 6 weeks or until released by your surgeon.  ?Do not drive while taking narcotics.  ?Wear elastic stockings for two weeks following surgery during the day but you may remove then at night.  ?Make sure you keep all of your appointments after your operation with all of your doctors and caregivers. You should call the office at the above phone number and make an appointment for approximately two weeks after the date of your surgery. ?Please pick up a stool softener and laxative for home use as long as you are requiring pain medications. ?ICE to the affected hip every three hours for 30 minutes at a time and then as needed for pain and swelling. Continue to use ice on the hip for pain and swelling from surgery. You may notice swelling that will progress down to the foot and ankle.  This is normal after surgery.  Elevate the leg when you are not up walking on it.   ?It is important for you to complete the blood thinner medication as prescribed by your doctor. ?Continue to use the breathing machine which will help keep your temperature down.  It is common for your temperature to cycle up and down following surgery, especially at night when you are not up moving around and exerting yourself.  The breathing machine keeps your lungs expanded and your temperature down. ? ?RANGE OF MOTION AND STRENGTHENING EXERCISES  ?These exercises are designed to help you   keep full movement of your hip joint. Follow your caregiver's or physical therapist's instructions. Perform all exercises about fifteen times, three times per day or as directed. Exercise both hips, even if you have had only one joint replacement. These exercises can be done on a training (exercise) mat, on the floor, on a table or on a bed. Use whatever works the best and is most comfortable for you. Use music or television while you are exercising so that the exercises are a  pleasant break in your day. This will make your life better with the exercises acting as a break in routine you can look forward to.  ?Lying on your back, slowly slide your foot toward your buttocks, raising your knee up off the floor. Then slowly slide your foot back down until your leg is straight again.  ?Lying on your back spread your legs as far apart as you can without causing discomfort.  ?Lying on your side, raise your upper leg and foot straight up from the floor as far as is comfortable. Slowly lower the leg and repeat.  ?Lying on your back, tighten up the muscle in the front of your thigh (quadriceps muscles). You can do this by keeping your leg straight and trying to raise your heel off the floor. This helps strengthen the largest muscle supporting your knee.  ?Lying on your back, tighten up the muscles of your buttocks both with the legs straight and with the knee bent at a comfortable angle while keeping your heel on the floor.  ? ?SKILLED REHAB INSTRUCTIONS: ?If the patient is transferred to a skilled rehab facility following release from the hospital, a list of the current medications will be sent to the facility for the patient to continue.  When discharged from the skilled rehab facility, please have the facility set up the patient's Home Health Physical Therapy prior to being released. Also, the skilled facility will be responsible for providing the patient with their medications at time of release from the facility to include their pain medication and their blood thinner medication. If the patient is still at the rehab facility at time of the two week follow up appointment, the skilled rehab facility will also need to assist the patient in arranging follow up appointment in our office and any transportation needs. ? ?POST-OPERATIVE OPIOID TAPER INSTRUCTIONS: ?It is important to wean off of your opioid medication as soon as possible. If you do not need pain medication after your surgery it is ok  to stop day one. ?Opioids include: ?Codeine, Hydrocodone(Norco, Vicodin), Oxycodone(Percocet, oxycontin) and hydromorphone amongst others.  ?Long term and even short term use of opiods can cause: ?Increased pain response ?Dependence ?Constipation ?Depression ?Respiratory depression ?And more.  ?Withdrawal symptoms can include ?Flu like symptoms ?Nausea, vomiting ?And more ?Techniques to manage these symptoms ?Hydrate well ?Eat regular healthy meals ?Stay active ?Use relaxation techniques(deep breathing, meditating, yoga) ?Do Not substitute Alcohol to help with tapering ?If you have been on opioids for less than two weeks and do not have pain than it is ok to stop all together.  ?Plan to wean off of opioids ?This plan should start within one week post op of your joint replacement. ?Maintain the same interval or time between taking each dose and first decrease the dose.  ?Cut the total daily intake of opioids by one tablet each day ?Next start to increase the time between doses. ?The last dose that should be eliminated is the evening dose.  ? ? ?MAKE   SURE YOU:  ?Understand these instructions.  ?Will watch your condition.  ?Will get help right away if you are not doing well or get worse. ? ?Pick up stool softner and laxative for home use following surgery while on pain medications. ?Do not remove your dressing. ?The dressing is waterproof--it is OK to take showers. ?Continue to use ice for pain and swelling after surgery. ?Do not use any lotions or creams on the incision until instructed by your surgeon. ?Total Hip Protocol. ? ?

## 2021-07-18 NOTE — Op Note (Signed)
OPERATIVE REPORT  SURGEON: Rod Can, MD   ASSISTANT: Larene Pickett, PA-C  PREOPERATIVE DIAGNOSIS: Displaced Left femoral neck fracture.   POSTOPERATIVE DIAGNOSIS: Displaced Left femoral neck fracture.   PROCEDURE: Left total hip arthroplasty, anterior approach.   IMPLANTS: Biomet Taperloc Reduced Distal stem, size 15x159m, high offset. Biomet G7 OsseoTi Cup, size 52 mm. Biomet Vivacit-E liner, size 36 mm, E, neutral. Biomet Biolox ceramic head ball, size 36 + 0 mm.  ANESTHESIA:  MAC and Spinal  ANTIBIOTICS: 2g ancef.  ESTIMATED BLOOD LOSS:-400 mL    DRAINS: None.  COMPLICATIONS: None   CONDITION: PACU - hemodynamically stable.   BRIEF CLINICAL NOTE: Veronica CRISTis a 76y.o. female with a displaced Left femoral neck fracture. The patient was admitted to the hospitalist service and underwent perioperative risk stratification and medical optimization. We allowed 72 hours for washout of her Xarelto.The risks, benefits, and alternatives to total hip arthroplasty were explained, and the patient elected to proceed.  PROCEDURE IN DETAIL: The patient was taken to the operating room and general anesthesia was induced on the hospital bed.  The patient was then positioned on the Hana table.  All bony prominences were well padded.  The hip was prepped and draped in the normal sterile surgical fashion.  A time-out was called verifying side and site of surgery. Antibiotics were given within 60 minutes of beginning the procedure.   Bikini incision was made, and the direct anterior approach to the hip was performed through the Hueter interval.  Lateral femoral circumflex vessels were treated with the Auqumantys. The anterior capsule was exposed and an inverted T capsulotomy was made.  Fracture hematoma was encountered and evacuated. The patient was found to have a comminuted Left subcapital femoral neck fracture.  I freshened the femoral neck cut with a saw.  I removed the femoral neck  fragment.  A corkscrew was placed into the head and the head was removed.  This was passed to the back table and was measured. The pubofemoral ligament was released subperiosteally to the lesser trochanter.  Acetabular exposure was achieved, and the pulvinar and labrum were excised. Sequential reaming of the acetabulum was then performed up to a size 51 mm reamer under direct visulization. A 52 mm cup was then opened and impacted into place at approximately 40 degrees of abduction and 20 degrees of anteversion. The final polyethylene liner was impacted into place and acetabular osteophytes were removed.    I then gained femoral exposure taking care to protect the abductors and greater trochanter.  This was performed using standard external rotation, extension, and adduction.  A cookie cutter was used to enter the femoral canal, and then the femoral canal finder was placed.  Sequential broaching was performed up to a size 15.  Calcar planer was used on the femoral neck remnant.  I placed a high offset neck and a trial head ball.  The hip was reduced.  Leg lengths and offset were checked fluoroscopically.  The hip was dislocated and trial components were removed.  The final implants were placed, and the hip was reduced.  Fluoroscopy was used to confirm component position and leg lengths.  At 90 degrees of external rotation and full extension, the hip was stable to an anterior directed force.   The wound was copiously irrigated with Irrisept solution and normal saline using pule lavage.  Marcaine solution was injected into the periarticular soft tissue.  The wound was closed in layers using #1 Stratafix for the fascia,  2-0 Vicryl for the subcutaneous fat, 2-0 Monocryl for the deep dermal layer, and staples + Dermabond for the skin.  Once the glue was fully dried, an Aquacell Ag dressing was applied.  The patient was transported to the recovery room in stable condition.  Sponge, needle, and instrument counts were  correct at the end of the case x2.  The patient tolerated the procedure well and there were no known complications.  Please note that a surgical assistant was a medical necessity for this procedure to perform it in a safe and expeditious manner. Assistant was necessary to provide appropriate retraction of vital neurovascular structures, to prevent femoral fracture, and to allow for anatomic placement of the prosthesis.

## 2021-07-18 NOTE — Interval H&P Note (Signed)
History and Physical Interval Note:  07/18/2021 4:43 PM  Veronica Bradford  has presented today for surgery, with the diagnosis of LEFT HIP FRACTURE.  The various methods of treatment have been discussed with the patient and family. After consideration of risks, benefits and other options for treatment, the patient has consented to  Procedure(s): TOTAL HIP ARTHROPLASTY ANTERIOR APPROACH (Left) as a surgical intervention.  The patient's history has been reviewed, patient examined, no change in status, stable for surgery.  I have reviewed the patient's chart and labs.  Questions were answered to the patient's satisfaction.     Hilton Cork Finnick Orosz

## 2021-07-18 NOTE — Transfer of Care (Signed)
Immediate Anesthesia Transfer of Care Note  Patient: Veronica Bradford  Procedure(s) Performed: TOTAL HIP ARTHROPLASTY ANTERIOR APPROACH (Left: Hip)  Patient Location: PACU  Anesthesia Type:Spinal  Level of Consciousness: awake  Airway & Oxygen Therapy: Patient Spontanous Breathing and Patient connected to face mask oxygen  Post-op Assessment: Report given to RN and Post -op Vital signs reviewed and stable  Post vital signs: Reviewed and stable  Last Vitals:  Vitals Value Taken Time  BP 118/52 07/18/21 1906  Temp    Pulse 64 07/18/21 1909  Resp    SpO2 100 % 07/18/21 1909  Vitals shown include unvalidated device data.  Last Pain:  Vitals:   07/18/21 1550  TempSrc: Oral  PainSc:       Patients Stated Pain Goal: 3 (91/50/41 3643)  Complications: No notable events documented.

## 2021-07-18 NOTE — Anesthesia Preprocedure Evaluation (Addendum)
Anesthesia Evaluation  Patient identified by MRN, date of birth, ID band Patient awake    Reviewed: Allergy & Precautions, NPO status , Patient's Chart, lab work & pertinent test results  History of Anesthesia Complications Negative for: history of anesthetic complications  Airway Mallampati: III  TM Distance: >3 FB Neck ROM: Full    Dental no notable dental hx. (+) Dental Advisory Given   Pulmonary sleep apnea , former smoker,    Pulmonary exam normal        Cardiovascular hypertension, Normal cardiovascular exam+ dysrhythmias Atrial Fibrillation + pacemaker      Neuro/Psych negative neurological ROS     GI/Hepatic Neg liver ROS, GERD  ,  Endo/Other  negative endocrine ROS  Renal/GU negative Renal ROS     Musculoskeletal negative musculoskeletal ROS (+)   Abdominal   Peds  Hematology negative hematology ROS (+)   Anesthesia Other Findings   Reproductive/Obstetrics                            Anesthesia Physical Anesthesia Plan  ASA: 3  Anesthesia Plan: Spinal   Post-op Pain Management: Celebrex PO (pre-op)*   Induction:   PONV Risk Score and Plan: 2 and Ondansetron and Propofol infusion  Airway Management Planned: Natural Airway and Simple Face Mask  Additional Equipment:   Intra-op Plan:   Post-operative Plan:   Informed Consent: I have reviewed the patients History and Physical, chart, labs and discussed the procedure including the risks, benefits and alternatives for the proposed anesthesia with the patient or authorized representative who has indicated his/her understanding and acceptance.     Dental advisory given  Plan Discussed with: Anesthesiologist and CRNA  Anesthesia Plan Comments:        Anesthesia Quick Evaluation

## 2021-07-18 NOTE — Progress Notes (Signed)
Progress Note    Veronica Bradford  PYP:[id number] DOB: [birth year]  DOA: 07/14/2021 PCP: Colon Branch, MD      Brief Narrative:    Medical records reviewed and are as summarized below:    76 y.o. female with medical history significant for hypertension, atrial fibrillation on Xarelto, tachybradycardia syndrome with pacer, and BMI 35, who presented to the emergency department with severe left hip pain after a fall.  Patient reports that she was in her usual state of health and having an uneventful day when she tripped and fell directly onto her left hip.  She experienced severe pain when she tried to bear weight and suspected she had suffered a fracture.  Patient reports that she walks 1.5 to 2 miles daily and never experiences shortness of breath or chest pain with that.  She had a nuclear stress test in August 2021 that was a low risk, normal study.   Plain films of the hip and pelvis demonstrate impacted subcapital left femoral neck fracture with minimal displacement.  Orthopedic surgery was consulted.  Plan for surgery 7/17      Assessment/Plan:   Principal Problem:   Closed left hip fracture, initial encounter [facility]) Active Problems:   Sleep apnea-- Dr Maxwell Caul, on CPAP   Paroxysmal atrial fibrillation (HCC)   Chronic diastolic CHF (congestive heart failure) ([place])     Left hip fracture, s/p mechanical fall Continue analgesics as needed for pain.  Plan for left hip surgical repair on 07/18/2021.   PAF  Continue flecainide and Cardizem.  Xarelto is on hold.    Chronic HFpEF  Compensated.  Preserved EF on echo in February 2023.  Hypokalemia  -replete   OSA  - Continue CPAP qHS     Obesity Estimated body mass index is 34.96 kg/m as calculated from the following:   Height as of this encounter: '5\' 8"'$  (1.727 m).   Weight as of this encounter: 104.3 kg.     Consultants: ortho    Medications:    chlorhexidine  60 mL Topical Once   diltiazem  120 mg Oral Daily    flecainide  100 mg Oral BID   pantoprazole  40 mg Oral Daily   povidone-iodine  2 Application Topical Once   pravastatin  40 mg Oral q1800   Ensure Max Protein  11 oz Oral Daily   triamterene-hydrochlorothiazide  0.5 tablet Oral Daily   Continuous Infusions:   ceFAZolin (ANCEF) IV     methocarbamol (ROBAXIN) IV     tranexamic acid       Anti-infectives (From admission, onward)    Start     Dose/Rate Route Frequency Ordered Stop   07/18/21 0815  ceFAZolin (ANCEF) IVPB 2g/100 mL premix        2 g 200 mL/hr over 30 Minutes Intravenous On call to O.R. 07/18/21 2458 07/19/21 0559              Family Communication/Anticipated D/C date and plan/Code Status   DVT prophylaxis:      Code Status: Full Code  Disposition Plan: To be determined after surgery   Status is: Inpatient Remains inpatient appropriate because: Awaiting left hip surgery on 07/18/2021       Subjective:   Has questions for Dr. Lyla Glassing about procedure  Objective:    Vitals:   07/17/21 0814 07/17/21 1339 07/17/21 2122 07/18/21 0542  BP: (!) 155/76 118/81 136/68 119/60  Pulse: 61 72 63 66  Resp:  17 18  18  Temp:  97.7 F (36.5 C) 98.7 F (37.1 C) 97.7 F (36.5 C)  TempSrc:   Oral Oral  SpO2:  100% 100% 98%  Weight:      Height:       No data found.   Intake/Output Summary (Last 24 hours) at 07/18/2021 1124 Last data filed at 07/18/2021 0600 Gross per 24 hour  Intake 890 ml  Output --  Net 890 ml   Filed Weights   07/14/21 2118  Weight: 104.3 kg    Exam:   General: Appearance:    Obese female in no acute distress     Lungs:     respirations unlabored  Heart:    Normal heart rate.   MS:   All extremities are intact.   Neurologic:   Awake, alert          Data Reviewed:   I have personally reviewed following labs and imaging studies:  Labs: Labs show the following:   Basic Metabolic Panel: Recent Labs  Lab 07/14/21 1515 07/15/21 0418 07/16/21 0328  07/17/21 0329  NA 140 143 140 140  K 3.3* 3.7 3.9 3.7  CL 104 112* 108 109  CO2 '28 28 25 27  '$ GLUCOSE 99 108* 116* 112*  BUN '17 17 18 21  '$ CREATININE 0.77 0.63 0.58 0.64  CALCIUM 10.5* 10.2 10.3 10.1  MG  --  2.2  --   --    GFR Estimated Creatinine Clearance: 79.2 mL/min (by C-G formula based on SCr of 0.64 mg/dL). Liver Function Tests: No results for input(s): "AST", "ALT", "ALKPHOS", "BILITOT", "PROT", "ALBUMIN" in the last 168 hours. No results for input(s): "LIPASE", "AMYLASE" in the last 168 hours. No results for input(s): "AMMONIA" in the last 168 hours. Coagulation profile Recent Labs  Lab 07/14/21 1515  INR 1.2    CBC: Recent Labs  Lab 07/14/21 1515 07/15/21 0418 07/16/21 0328 07/17/21 0329  WBC 8.1 6.7 6.5 6.3  NEUTROABS 6.0  --   --   --   HGB 14.3 12.3 12.4 12.2  HCT 42.8 37.5 39.4 37.5  MCV 90.1 92.1 95.4 92.6  PLT 210 193 173 179   Cardiac Enzymes: No results for input(s): "CKTOTAL", "CKMB", "CKMBINDEX", "TROPONINI" in the last 168 hours. BNP (last 3 results) No results for input(s): "PROBNP" in the last 8760 hours. CBG: No results for input(s): "GLUCAP" in the last 168 hours. D-Dimer: No results for input(s): "DDIMER" in the last 72 hours. Hgb A1c: No results for input(s): "HGBA1C" in the last 72 hours. Lipid Profile: No results for input(s): "CHOL", "HDL", "LDLCALC", "TRIG", "CHOLHDL", "LDLDIRECT" in the last 72 hours. Thyroid function studies: No results for input(s): "TSH", "T4TOTAL", "T3FREE", "THYROIDAB" in the last 72 hours.  Invalid input(s): "FREET3" Anemia work up: No results for input(s): "VITAMINB12", "FOLATE", "FERRITIN", "TIBC", "IRON", "RETICCTPCT" in the last 72 hours. Sepsis Labs: Recent Labs  Lab 07/14/21 1515 07/15/21 0418 07/16/21 0328 07/17/21 0329  WBC 8.1 6.7 6.5 6.3    Microbiology Recent Results (from the past 240 hour(s))  Surgical pcr screen     Status: Abnormal   Collection Time: 07/16/21  6:22 AM    Specimen: Nasal Mucosa; Nasal Swab  Result Value Ref Range Status   MRSA, PCR NEGATIVE NEGATIVE Final   Staphylococcus aureus POSITIVE (A) NEGATIVE Final    Comment: (NOTE) The Xpert SA Assay (FDA approved for NASAL specimens in patients 82 years of age and older), is one component of a comprehensive surveillance program. It  is not intended to diagnose infection nor to guide or monitor treatment. Performed at [facility], [place] [address], [place], [place] [address]     Procedures and diagnostic studies:  No results found.             LOS: 4 days   Geradine Girt  [facility]   Pager on [email or web address]. If 7PM-7AM, please contact night-coverage at [email or web address]     07/18/2021, 11:24 AM

## 2021-07-18 NOTE — Care Management Important Message (Signed)
Important Message  Patient Details IM Letter given to the Patient. Name: Veronica Bradford MRN: [id number] Date of Birth: 11/07/1945   Medicare Important Message Given:  Yes     Kerin Salen 07/18/2021, 3:03 PM

## 2021-07-18 NOTE — Anesthesia Procedure Notes (Signed)
Spinal  Patient location during procedure: OR Start time: 07/18/2021 4:56 PM End time: 07/18/2021 5:06 PM Reason for block: surgical anesthesia Staffing Performed: anesthesiologist  Anesthesiologist: Duane Boston, MD Performed by: Duane Boston, MD Authorized by: Duane Boston, MD   Preanesthetic Checklist Completed: patient identified, IV checked, risks and benefits discussed, surgical consent, monitors and equipment checked, pre-op evaluation and timeout performed Spinal Block Patient position: left lateral decubitus Prep: DuraPrep Patient monitoring: cardiac monitor, continuous pulse ox and blood pressure Approach: midline Location: L2-3 Injection technique: single-shot Needle Needle type: Pencan  Needle gauge: 24 G Needle length: 9 cm Assessment Events: CSF return Additional Notes Functioning IV was confirmed and monitors were applied. Sterile prep and drape, including hand hygiene and sterile gloves were used. The patient was positioned and the spine was prepped. The skin was anesthetized with lidocaine.  Free flow of clear CSF was obtained prior to injecting local anesthetic into the CSF.  The spinal needle aspirated freely following injection.  The needle was carefully withdrawn.  The patient tolerated the procedure well.

## 2021-07-19 ENCOUNTER — Other Ambulatory Visit (HOSPITAL_COMMUNITY): Payer: Self-pay

## 2021-07-19 DIAGNOSIS — S72002A Fracture of unspecified part of neck of left femur, initial encounter for closed fracture: Secondary | ICD-10-CM | POA: Diagnosis not present

## 2021-07-19 LAB — CBC
HCT: 36 % (ref 36.0–46.0)
Hemoglobin: 11.7 g/dL — ABNORMAL LOW (ref 12.0–15.0)
MCH: 30.3 pg (ref 26.0–34.0)
MCHC: 32.5 g/dL (ref 30.0–36.0)
MCV: 93.3 fL (ref 80.0–100.0)
Platelets: 173 10*3/uL (ref 150–400)
RBC: 3.86 MIL/uL — ABNORMAL LOW (ref 3.87–5.11)
RDW: 14 % (ref 11.5–15.5)
WBC: 9 10*3/uL (ref 4.0–10.5)
nRBC: 0 % (ref 0.0–0.2)

## 2021-07-19 LAB — BASIC METABOLIC PANEL
Anion gap: 6 (ref 5–15)
BUN: 16 mg/dL (ref 8–23)
CO2: 24 mmol/L (ref 22–32)
Calcium: 9.3 mg/dL (ref 8.9–10.3)
Chloride: 110 mmol/L (ref 98–111)
Creatinine, Ser: 0.59 mg/dL (ref 0.44–1.00)
GFR, Estimated: >60 mL/min (ref >60)
Glucose, Bld: 143 mg/dL — ABNORMAL HIGH (ref 70–99)
Potassium: 4.1 mmol/L (ref 3.5–5.1)
Sodium: 140 mmol/L (ref 135–145)

## 2021-07-19 MED ORDER — MUPIROCIN 2 % EX OINT: 1.0000 | TOPICAL_OINTMENT | Freq: Two times a day (BID) | CUTANEOUS | Status: DC

## 2021-07-19 MED ORDER — CHLORHEXIDINE GLUCONATE CLOTH 2 % EX PADS
6.0000 | MEDICATED_PAD | Freq: Every day | CUTANEOUS | Status: DC
  Administered 2021-07-19 – 2021-07-21 (×2): 6 via TOPICAL

## 2021-07-19 MED ORDER — CHLORHEXIDINE GLUCONATE CLOTH 2 % EX PADS: 6.0000 | MEDICATED_PAD | Freq: Every day | CUTANEOUS | Status: DC

## 2021-07-19 MED ORDER — RIVAROXABAN 10 MG PO TABS
10.0000 mg | ORAL_TABLET | Freq: Every day | ORAL | Status: DC
  Administered 2021-07-19 – 2021-07-20 (×2): 10 mg via ORAL
  Filled 2021-07-19 (×2): qty 1

## 2021-07-19 MED ORDER — HYDROCODONE-ACETAMINOPHEN 10-325 MG PO TABS: 0.5000 | ORAL_TABLET | ORAL | 0 refills | Status: DC | PRN

## 2021-07-19 MED ORDER — RIVAROXABAN 20 MG PO TABS: 20.0000 mg | ORAL_TABLET | Freq: Every day | ORAL | 3 refills | Status: DC

## 2021-07-19 MED ORDER — RIVAROXABAN 10 MG PO TABS
10.0000 mg | ORAL_TABLET | Freq: Every day | ORAL | 0 refills | Status: DC
  Filled 2021-07-19: qty 2, 2d supply, fill #0

## 2021-07-19 MED ORDER — MUPIROCIN 2 % EX OINT
1.0000 | TOPICAL_OINTMENT | Freq: Two times a day (BID) | CUTANEOUS | Status: DC
  Administered 2021-07-19 – 2021-07-20 (×3): 1 via NASAL
  Filled 2021-07-19: qty 22

## 2021-07-19 MED ORDER — RIVAROXABAN 10 MG PO TABS: 10.0000 mg | ORAL_TABLET | Freq: Every day | ORAL | Status: DC

## 2021-07-19 NOTE — Progress Notes (Signed)
Ace wrap with blood noted, ace taken off wnd cotton wrap aquacel with drainage on distal portion leaking out. Gauze, ABD tape applied. K Emisten notified with orders received D Bethanie Dicker

## 2021-07-19 NOTE — Discharge Summary (Addendum)
Physician Discharge Summary  Veronica Bradford ZOX:[id number] DOB: 07/06/1945 DOA: 07/14/2021  PCP: Colon Branch, MD  Admit date: 07/14/2021 Discharge date: 07/19/2021  Admitted From: home Discharge disposition: home   Recommendations for Outpatient Follow-Up:   Home health PT Per ortho-- xarelto '10mg'$  x 2 days prior to resuming the 20 mg dose   Discharge Diagnosis:   Principal Problem:   Closed left hip fracture, initial encounter [facility]) Active Problems:   Sleep apnea-- Dr Maxwell Caul, on CPAP   Paroxysmal atrial fibrillation ([place])   Chronic diastolic CHF (congestive heart failure) ([place])    Discharge Condition: Improved.  Diet recommendation: Low sodium, heart healthy  Wound care: None.  Code status: Full.   History of Present Illness:   Veronica Bradford is a 76 y.o. female with medical history significant for hypertension, atrial fibrillation on Xarelto, tachybradycardia syndrome with pacer, and BMI 35, who presented to the emergency department with severe left hip pain after a fall.  Patient reports that she was in her usual state of health and having an uneventful day when she tripped and fell directly onto her left hip.  She experienced severe pain when she tried to bear weight and suspected she had suffered a fracture.  Patient reports that she walks 1.5 to 2 miles daily and never experiences shortness of breath or chest pain with that.  She had a nuclear stress test in August 2021 that was a low risk, normal study   Hospital Course by Problem:   Left hip fracture, s/p mechanical fall Continue analgesics as needed for pain.  Plan for left hip surgical repair on 07/18/2021. -PT at home   PAF  Continue flecainide and Cardizem -resume xarleto   Chronic HFpEF  Compensated.  Preserved EF on echo in February 2023.   Hypokalemia  -repleted   OSA  - Continue CPAP qHS     Obesity Estimated body mass index is 34.96 kg/m as calculated from the following:   Height as  of this encounter: '5\' 8"'$  (1.727 m).   Weight as of this encounter: 104.3 kg.     Medical Consultants:   ortho   Discharge Exam:   Vitals:   07/19/21 0624 07/19/21 0943  BP: (!) 126/53 (!) 127/50  Pulse: (!) 57 66  Resp: 18 20  Temp: 97.8 F (36.6 C) 98 F (36.7 C)  SpO2: 98% 100%   Vitals:   07/18/21 2203 07/19/21 0138 07/19/21 0624 07/19/21 0943  BP: (!) 129/58 (!) 114/55 (!) 126/53 (!) 127/50  Pulse: 66 60 (!) 57 66  Resp: '18 18 18 20  '$ Temp: (!) 97.4 F (36.3 C) 97.6 F (36.4 C) 97.8 F (36.6 C) 98 F (36.7 C)  TempSrc: Oral   Oral  SpO2: 100% 100% 98% 100%  Weight:      Height:        General exam: Appears calm and comfortable.   The results of significant diagnostics from this hospitalization (including imaging, microbiology, ancillary and laboratory) are listed below for reference.     Procedures and Diagnostic Studies:   DG Knee Complete 4 Views Left  Result Date: 07/14/2021 CLINICAL DATA:  Left femoral neck fracture after a fall EXAM: LEFT KNEE - COMPLETE 4+ VIEW COMPARISON:  Hip radiographs of same date dictated separately. MRI of 04/10/2007. FINDINGS: Marked patellofemoral and moderate medial/lateral compartment joint space narrowing with osteophyte formation. Chondrocalcinosis within the menisci. Small suprapatellar joint effusion. No acute fracture or dislocation. IMPRESSION: Tricompartmental osteoarthritis,  with small suprapatellar joint effusion. 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 Electronically Signed   By: Abigail Miyamoto M.D.   On: 07/14/2021 15:57   DG Chest Port 1 View  Result Date: 07/14/2021 CLINICAL DATA:  Preop. EXAM: PORTABLE CHEST 1 VIEW COMPARISON:  09/13/2020. FINDINGS: Cardiomediastinal silhouette is similar when accounting for differences in technique. Left subclavian approach cardiac rhythm maintenance device. No consolidation. No visible pleural effusions or pneumothorax. No  acute osseous abnormality. IMPRESSION: No evidence of acute cardiopulmonary disease. Electronically Signed   By: Margaretha Sheffield M.D.   On: 07/14/2021 15:35   DG Hip Unilat W or Wo Pelvis 2-3 Views Left  Result Date: 07/14/2021 CLINICAL DATA:  Injury, fall EXAM: DG HIP (WITH OR WITHOUT PELVIS) 2-3V LEFT COMPARISON:  CT 03/09/2020 FINDINGS: There is an impacted subcapital left femoral neck fracture with minimal displacement. Lower lumbar spine degenerative changes. IMPRESSION: Impacted subcapital left femoral neck fracture with minimal displacement. Electronically Signed   By: Maurine Simmering M.D.   On: 07/14/2021 14:50     Labs:   Basic Metabolic Panel: Recent Labs  Lab 07/14/21 1515 07/15/21 0418 07/16/21 0328 07/17/21 0329 07/19/21 0357  NA 140 143 140 140 140  K 3.3* 3.7 3.9 3.7 4.1  CL 104 112* 108 109 110  CO2 '28 28 25 27 24  '$ GLUCOSE 99 108* 116* 112* 143*  BUN '17 17 18 21 16  '$ CREATININE 0.77 0.63 0.58 0.64 0.59  CALCIUM 10.5* 10.2 10.3 10.1 9.3  MG  --  2.2  --   --   --    GFR Estimated Creatinine Clearance: 79.2 mL/min (by C-G formula based on SCr of 0.59 mg/dL). Liver Function Tests: No results for input(s): "AST", "ALT", "ALKPHOS", "BILITOT", "PROT", "ALBUMIN" in the last 168 hours. No results for input(s): "LIPASE", "AMYLASE" in the last 168 hours. No results for input(s): "AMMONIA" in the last 168 hours. Coagulation profile Recent Labs  Lab 07/14/21 1515  INR 1.2    CBC: Recent Labs  Lab 07/14/21 1515 07/15/21 0418 07/16/21 0328 07/17/21 0329 07/19/21 0357  WBC 8.1 6.7 6.5 6.3 9.0  NEUTROABS 6.0  --   --   --   --   HGB 14.3 12.3 12.4 12.2 11.7*  HCT 42.8 37.5 39.4 37.5 36.0  MCV 90.1 92.1 95.4 92.6 93.3  PLT 210 193 173 179 173   Cardiac Enzymes: No results for input(s): "CKTOTAL", "CKMB", "CKMBINDEX", "TROPONINI" in the last 168 hours. BNP: Invalid input(s): "POCBNP" CBG: No results for input(s): "GLUCAP" in the last 168 hours. D-Dimer No  results for input(s): "DDIMER" in the last 72 hours. Hgb A1c No results for input(s): "HGBA1C" in the last 72 hours. Lipid Profile No results for input(s): "CHOL", "HDL", "LDLCALC", "TRIG", "CHOLHDL", "LDLDIRECT" in the last 72 hours. Thyroid function studies No results for input(s): "TSH", "T4TOTAL", "T3FREE", "THYROIDAB" in the last 72 hours.  Invalid input(s): "FREET3" Anemia work up No results for input(s): "VITAMINB12", "FOLATE", "FERRITIN", "TIBC", "IRON", "RETICCTPCT" in the last 72 hours. Microbiology Recent Results (from the past 240 hour(s))  Surgical pcr screen     Status: Abnormal   Collection Time: 07/16/21  6:22 AM   Specimen: Nasal Mucosa; Nasal Swab  Result Value Ref Range Status   MRSA, PCR NEGATIVE NEGATIVE Final   Staphylococcus aureus POSITIVE (A) NEGATIVE Final    Comment: (NOTE) The Xpert SA Assay (FDA approved for NASAL specimens in patients 79 years of age and older), is one  component of a comprehensive surveillance program. It is not intended to diagnose infection nor to guide or monitor treatment. Performed at [facility], [place] [address], [place], [place] [address]      Discharge Instructions:   Discharge Instructions     Diet general   Complete by: As directed    Discharge instructions   Complete by: As directed    take '10mg'$  of Xarelto for another 2 days (7/19 and 7/20) before going back to the '20mg'$  at baseline   Increase activity slowly   Complete by: As directed    No wound care   Complete by: As directed       Allergies as of 07/19/2021       Reactions   Ibuprofen Other (See Comments)   Pt is on Xarelto   Lactose Intolerance (gi) Diarrhea, Nausea And Vomiting   Lisinopril Cough   Nsaids Other (See Comments)   Pt on Xarelto   Tape    Causes blisters   Tolmetin Other (See Comments)   Pt on Xarelto   Zoster Vaccine Live Other (See Comments)   Serum sickness, high fever, neck pain    Amoxicillin Rash    Penicillins Rash   Local reaction        Medication List     STOP taking these medications    acetaminophen-codeine 300-30 MG tablet Commonly known as: TYLENOL #3       TAKE these medications    acetaminophen 500 MG tablet Commonly known as: TYLENOL Take 500 mg by mouth every 6 (six) hours as needed for moderate pain or headache.   ARTIFICIAL TEAR SOLUTION OP Place 1 drop into both eyes 4 (four) times daily.   Boostrix 5-2.5-18.5 LF-MCG/0.5 injection Generic drug: Tdap Inject into the muscle.   cholecalciferol 25 MCG (1000 UNIT) tablet Commonly known as: VITAMIN D3 Take 1,000 Units by mouth daily.   CRANBERRY EXTRACT PO Take 2 tablets by mouth daily.   diltiazem 120 MG 24 hr capsule Commonly known as: CARDIZEM CD TAKE 1 CAPSULE(120 MG) BY MOUTH DAILY What changed:  how much to take how to take this when to take this additional instructions   diltiazem 30 MG tablet Commonly known as: Cardizem Take 1 tablet every 4 hours AS NEEDED for heart rate >100 as long as blood pressure >100.   FISH OIL PO Take 1 tablet by mouth daily.   flecainide 100 MG tablet Commonly known as: TAMBOCOR Take 1 tablet (100 mg total) by mouth 2 (two) times daily.   fluticasone 50 MCG/ACT nasal spray Commonly known as: FLONASE Place 1 spray into both nostrils every other day.   FOLIC ACID PO Take 1 tablet by mouth daily.   furosemide 40 MG tablet Commonly known as: LASIX Take 1 tablet (40 mg total) by mouth daily.   HYDROcodone-acetaminophen 10-325 MG tablet Commonly known as: Norco Take 0.5 tablets by mouth every 4 (four) hours as needed for up to 7 days for severe pain or moderate pain.   IRON PO Take 1 tablet by mouth daily.   pantoprazole 40 MG tablet Commonly known as: PROTONIX Take 1 tablet (40 mg total) by mouth 2 (two) times daily. What changed: when to take this   pravastatin 40 MG tablet Commonly known as: PRAVACHOL TAKE 1 TABLET(40 MG) BY MOUTH AT  BEDTIME What changed: See the new instructions.   rivaroxaban 10 MG Tabs tablet Commonly known as: XARELTO Take 1 tablet (10 mg total) by mouth daily  for 2 days. What changed: You were already taking a medication with the same name, and this prescription was added. Make sure you understand how and when to take each.   rivaroxaban 20 MG Tabs tablet Commonly known as: Xarelto Take 1 tablet (20 mg total) by mouth daily with supper. Start taking on: July 22, 2021 What changed: These instructions start on July 22, 2021. If you are unsure what to do until then, ask your doctor or other care provider.   triamterene-hydrochlorothiazide 37.5-25 MG tablet Commonly known as: MAXZIDE-25 TAKE 1/2 TABLET BY MOUTH DAILY   VITAMIN B 12 PO Take 1 tablet by mouth daily.        Follow-up Information     Swinteck, Aaron Edelman, MD. Schedule an appointment as soon as possible for a visit in 2 week(s).   Specialty: Orthopedic Surgery Why: For wound re-check, For suture removal Contact information: [phone] [place] [address] [phone]                  Time coordinating discharge: 35 min  Signed:  Geradine Girt DO  [facility] 07/19/2021, 1:52 PM

## 2021-07-19 NOTE — Plan of Care (Signed)
  Problem: Elimination: Goal: Will not experience complications related to bowel motility Outcome: Progressing   Problem: Education: Goal: Knowledge of the prescribed therapeutic regimen will improve Outcome: Progressing   Problem: Activity: Goal: Ability to avoid complications of mobility impairment will improve Outcome: Progressing   Problem: Pain Management: Goal: Pain level will decrease with appropriate interventions Outcome: Progressing

## 2021-07-19 NOTE — Evaluation (Signed)
Physical Therapy Evaluation Patient Details Name: Veronica Bradford MRN: [id number] DOB: [birth year] Today's Date: 07/19/2021  History of Present Illness  76 y.o. female who presented to the emergency department with severe left hip pain after a fall and underwent Left total hip arthroplasty, anterior approach on 07/18/21 with medical history significant for hypertension, atrial fibrillation on Xarelto, tachybradycardia syndrome with pacer, and BMI 35.  Clinical Impression  Pt admitted with above diagnosis.  Pt doing very well today. Demonstrated proficiency with tasks/mobility as noted below. Pt is ready to d/c from PT standpoint. Would benefit from HHPT. Will follow in acute setting  Pt currently with functional limitations due to the deficits listed below (see PT Problem List). Pt will benefit from skilled PT to increase their independence and safety with mobility to allow discharge to the venue listed below.          Recommendations for follow up therapy are one component of a multi-disciplinary discharge planning process, led by the attending physician.  Recommendations may be updated based on patient status, additional functional criteria and insurance authorization.  Follow Up Recommendations Home health PT      Assistance Recommended at Discharge Intermittent Supervision/Assistance  Patient can return home with the following  Assistance with cooking/housework;Assist for transportation;Help with stairs or ramp for entrance    Equipment Recommendations None recommended by PT  Recommendations for Other Services       Functional Status Assessment Patient has had a recent decline in their functional status and demonstrates the ability to make significant improvements in function in a reasonable and predictable amount of time.     Precautions / Restrictions Precautions Precautions: Fall Restrictions Weight Bearing Restrictions: No LLE Weight Bearing: Weight bearing as tolerated Other  Position/Activity Restrictions: WBAT      Mobility  Bed Mobility               General bed mobility comments: in  recliner    Transfers Overall transfer level: Needs assistance Equipment used: Rolling walker (2 wheels) Transfers: Sit to/from Stand Sit to Stand: Min assist, Supervision           General transfer comment: verbal cues for hand placement. pt self cues end of session    Ambulation/Gait Ambulation/Gait assistance: Min guard, Supervision Gait Distance (Feet): 120 Feet Assistive device: Rolling walker (2 wheels) Gait Pattern/deviations: Step-to pattern       General Gait Details: initial cues for sequence,  pt demonstrates good carryover during session for correct sequence and RW position  Stairs Stairs: Yes Stairs assistance: Min guard, Min assist Stair Management: Step to pattern, Forwards, With walker Number of Stairs: 2 General stair comments: verbal cues for sequence, husband present for stair training, able to return demo  Wheelchair Mobility    Modified Rankin (Stroke Patients Only)       Balance   Sitting-balance support: Feet supported, No upper extremity supported Sitting balance-Leahy Scale: Good       Standing balance-Leahy Scale: Fair Standing balance comment: pt is able to static stand without UE support                             Pertinent Vitals/Pain      Home Living Family/patient expects to be discharged to:: Private residence Living Arrangements: Spouse/significant other;Children Available Help at Discharge: Family;Available 24 hours/day Type of Home: House Home Access: Ramped entrance;Other (comment) (+ 2 steps)       Home Layout: Full  bath on main level;Able to live on main level with bedroom/bathroom Home Equipment: Shower seat;Hand held shower head;Cane - single point;Rolling Walker (2 wheels) Additional Comments: Adjustable bed, Lift recliner but in the baseline.    Prior Function Prior Level  of Function : Independent/Modified Independent;History of Falls (last six months)               ADLs Comments: Likes to walk the dog. Retired ICU Nurse     Hand Dominance   Dominant Hand: Right    Extremity/Trunk Assessment   Upper Extremity Assessment Upper Extremity Assessment: Defer to OT evaluation;Overall [facility] for tasks assessed    Lower Extremity Assessment Lower Extremity Assessment: LLE deficits/detail LLE Deficits / Details: ankle WF, knee and hip grossly 2+/5    Cervical / Trunk Assessment Cervical / Trunk Assessment: Normal  Communication   Communication: No difficulties  Cognition                                                General Comments      Exercises General Exercises - Lower Extremity Ankle Circles/Pumps: AROM, Both, 10 reps   Assessment/Plan    PT Assessment Patient needs continued PT services  PT Problem List Decreased strength;Decreased mobility;Decreased activity tolerance;Decreased knowledge of use of DME       PT Treatment Interventions DME instruction;Therapeutic exercise;Gait training;Functional mobility training;Therapeutic activities;Patient/family education;Stair training    PT Goals (Current goals can be found in the Care Plan section)  Acute Rehab PT Goals Patient Stated Goal: home soon PT Goal Formulation: With patient Time For Goal Achievement: 07/26/21 Potential to Achieve Goals: Good    Frequency 7X/week     Co-evaluation               AM-PAC PT "6 Clicks" Mobility  Outcome Measure Help needed turning from your back to your side while in a flat bed without using bedrails?: A Little Help needed moving from lying on your back to sitting on the side of a flat bed without using bedrails?: A Little Help needed moving to and from a bed to a chair (including a wheelchair)?: A Little Help needed standing up from a chair using your arms (e.g., wheelchair or bedside chair)?: A Little Help needed  to walk in hospital room?: A Little Help needed climbing 3-5 steps with a railing? : A Little 6 Click Score: 18    End of Session Equipment Utilized During Treatment: Gait belt Activity Tolerance: Patient tolerated treatment well Patient left: in chair;with call bell/phone within reach;with chair alarm set;with family/visitor present Nurse Communication: Mobility status PT Visit Diagnosis: Other abnormalities of gait and mobility (R26.89);Difficulty in walking, not elsewhere classified (R26.2)    Time: 6811-5726 PT Time Calculation (min) (ACUTE ONLY): 39 min   Charges:   PT Evaluation $PT Eval Low Complexity: 1 Low PT Treatments $Gait Training: 23-37 mins        Sahiti Joswick, PT  Acute Rehab Dept [facility]) [phone]  [facility] Weekend Pager [facility] only)  [phone]  07/19/2021   [facility] 07/19/2021, 12:53 PM

## 2021-07-19 NOTE — Progress Notes (Signed)
Progress Note    Veronica Bradford  YJE:[id number] DOB: 06/12/1945  DOA: 07/14/2021 PCP: Colon Branch, MD      Brief Narrative:    Medical records reviewed and are as summarized below:    76 y.o. female with medical history significant for hypertension, atrial fibrillation on Xarelto, tachybradycardia syndrome with pacer, and BMI 35, who presented to the emergency department with severe left hip pain after a fall.  Patient reports that she was in her usual state of health and having an uneventful day when she tripped and fell directly onto her left hip.  She experienced severe pain when she tried to bear weight and suspected she had suffered a fracture.  Patient reports that she walks 1.5 to 2 miles daily and never experiences shortness of breath or chest pain with that.  She had a nuclear stress test in August 2021 that was a low risk, normal study.   Plain films of the hip and pelvis demonstrate impacted subcapital left femoral neck fracture with minimal displacement.  Orthopedic surgery was consulted. S/p surgery 7/17      Assessment/Plan:   Principal Problem:   Closed left hip fracture, initial encounter [facility]) Active Problems:   Sleep apnea-- Dr Maxwell Caul, on CPAP   Paroxysmal atrial fibrillation (HCC)   Chronic diastolic CHF (congestive heart failure) ([place])     Left hip fracture, s/p mechanical fall Continue analgesics as needed for pain.  Plan for left hip surgical repair on 07/18/2021. -PT/OT eval pending   PAF  Continue flecainide and Cardizem -resume xarleto  Chronic HFpEF  Compensated.  Preserved EF on echo in February 2023.  Hypokalemia  -repleted   OSA  - Continue CPAP qHS     Obesity Estimated body mass index is 34.96 kg/m as calculated from the following:   Height as of this encounter: '5\' 8"'$  (1.727 m).   Weight as of this encounter: 104.3 kg.     Consultants: ortho    Medications:    Chlorhexidine Gluconate Cloth  6 each Topical Q0600    diltiazem  120 mg Oral Daily   docusate sodium  100 mg Oral BID   flecainide  100 mg Oral BID   mupirocin ointment  1 Application Nasal BID   pantoprazole  40 mg Oral Daily   pravastatin  40 mg Oral q1800   Ensure Max Protein  11 oz Oral Daily   rivaroxaban  10 mg Oral Q supper   senna  1 tablet Oral BID   triamterene-hydrochlorothiazide  0.5 tablet Oral Daily   Continuous Infusions:  sodium chloride 150 mL/hr at 07/18/21 2059   methocarbamol (ROBAXIN) IV       Anti-infectives (From admission, onward)    Start     Dose/Rate Route Frequency Ordered Stop   07/18/21 2300  ceFAZolin (ANCEF) IVPB 2g/100 mL premix        2 g 200 mL/hr over 30 Minutes Intravenous Every 6 hours 07/18/21 2050 07/19/21 0453   07/18/21 0815  ceFAZolin (ANCEF) IVPB 2g/100 mL premix        2 g 200 mL/hr over 30 Minutes Intravenous On call to O.R. 07/18/21 0723 07/18/21 1724              Family Communication/Anticipated D/C date and plan/Code Status   DVT prophylaxis: rivaroxaban (XARELTO) tablet 10 mg Start: 07/19/21 1700 SCDs Start: 07/18/21 2051 rivaroxaban (XARELTO) tablet 10 mg     Code Status: Full Code  Disposition Plan: PT/OT  eval   Status is: Inpatient Remains inpatient appropriate because: hip surgery on 07/18/2021       Subjective:   No SOB, no CP  Objective:    Vitals:   07/18/21 2203 07/19/21 0138 07/19/21 0624 07/19/21 0943  BP: (!) 129/58 (!) 114/55 (!) 126/53 (!) 127/50  Pulse: 66 60 (!) 57 66  Resp: '18 18 18 20  '$ Temp: (!) 97.4 F (36.3 C) 97.6 F (36.4 C) 97.8 F (36.6 C) 98 F (36.7 C)  TempSrc: Oral   Oral  SpO2: 100% 100% 98% 100%  Weight:      Height:       No data found.   Intake/Output Summary (Last 24 hours) at 07/19/2021 1137 Last data filed at 07/19/2021 0600 Gross per 24 hour  Intake 4348.65 ml  Output 1500 ml  Net 2848.65 ml   Filed Weights   07/14/21 2118  Weight: 104.3 kg    Exam:   General: Appearance:    Obese female in no  acute distress     Lungs:      respirations unlabored  Heart:    Normal heart rate.   MS:   All extremities are intact.   Neurologic:   Awake, alert, oriented x 3. No apparent focal neurological           defect.      Data Reviewed:   I have personally reviewed following labs and imaging studies:  Labs: Labs show the following:   Basic Metabolic Panel: Recent Labs  Lab 07/14/21 1515 07/15/21 0418 07/16/21 0328 07/17/21 0329 07/19/21 0357  NA 140 143 140 140 140  K 3.3* 3.7 3.9 3.7 4.1  CL 104 112* 108 109 110  CO2 '28 28 25 27 24  '$ GLUCOSE 99 108* 116* 112* 143*  BUN '17 17 18 21 16  '$ CREATININE 0.77 0.63 0.58 0.64 0.59  CALCIUM 10.5* 10.2 10.3 10.1 9.3  MG  --  2.2  --   --   --    GFR Estimated Creatinine Clearance: 79.2 mL/min (by C-G formula based on SCr of 0.59 mg/dL). Liver Function Tests: No results for input(s): "AST", "ALT", "ALKPHOS", "BILITOT", "PROT", "ALBUMIN" in the last 168 hours. No results for input(s): "LIPASE", "AMYLASE" in the last 168 hours. No results for input(s): "AMMONIA" in the last 168 hours. Coagulation profile Recent Labs  Lab 07/14/21 1515  INR 1.2    CBC: Recent Labs  Lab 07/14/21 1515 07/15/21 0418 07/16/21 0328 07/17/21 0329 07/19/21 0357  WBC 8.1 6.7 6.5 6.3 9.0  NEUTROABS 6.0  --   --   --   --   HGB 14.3 12.3 12.4 12.2 11.7*  HCT 42.8 37.5 39.4 37.5 36.0  MCV 90.1 92.1 95.4 92.6 93.3  PLT 210 193 173 179 173   Cardiac Enzymes: No results for input(s): "CKTOTAL", "CKMB", "CKMBINDEX", "TROPONINI" in the last 168 hours. BNP (last 3 results) No results for input(s): "PROBNP" in the last 8760 hours. CBG: No results for input(s): "GLUCAP" in the last 168 hours. D-Dimer: No results for input(s): "DDIMER" in the last 72 hours. Hgb A1c: No results for input(s): "HGBA1C" in the last 72 hours. Lipid Profile: No results for input(s): "CHOL", "HDL", "LDLCALC", "TRIG", "CHOLHDL", "LDLDIRECT" in the last 72 hours. Thyroid  function studies: No results for input(s): "TSH", "T4TOTAL", "T3FREE", "THYROIDAB" in the last 72 hours.  Invalid input(s): "FREET3" Anemia work up: No results for input(s): "VITAMINB12", "FOLATE", "FERRITIN", "TIBC", "IRON", "RETICCTPCT" in the last 72 hours.  Sepsis Labs: Recent Labs  Lab 07/15/21 0418 07/16/21 0328 07/17/21 0329 07/19/21 0357  WBC 6.7 6.5 6.3 9.0    Microbiology Recent Results (from the past 240 hour(s))  Surgical pcr screen     Status: Abnormal   Collection Time: 07/16/21  6:22 AM   Specimen: Nasal Mucosa; Nasal Swab  Result Value Ref Range Status   MRSA, PCR NEGATIVE NEGATIVE Final   Staphylococcus aureus POSITIVE (A) NEGATIVE Final    Comment: (NOTE) The Xpert SA Assay (FDA approved for NASAL specimens in patients 63 years of age and older), is one component of a comprehensive surveillance program. It is not intended to diagnose infection nor to guide or monitor treatment. Performed at [facility], [place] [address], [place], [place] [address]     Procedures and diagnostic studies:  DG Pelvis Portable  Result Date: 07/18/2021 CLINICAL DATA:  Left hip replacement EXAM: PORTABLE PELVIS 1-2 VIEWS COMPARISON:  03/09/2020 FINDINGS: Total hip arthroplasty on the left. Components appear well positioned. No radiographically detectable complication. IMPRESSION: Good appearance following total hip arthroplasty on the left. Electronically Signed   By: Nelson Chimes M.D.   On: 07/18/2021 19:45   DG HIP PORT UNILAT WITH PELVIS 1V LEFT  Result Date: 07/18/2021 CLINICAL DATA:  Total hip arthroplasty EXAM: DG HIP (WITH OR WITHOUT PELVIS) 1V PORT LEFT COMPARISON:  07/14/2021 FINDINGS: Two C-arm images show total arthroplasty on the left. Components appear well position. No radiographically detectable complication. IMPRESSION: Good appearance following total hip arthroplasty on the left. Electronically Signed   By: Nelson Chimes M.D.   On: 07/18/2021 18:54    DG C-Arm 1-60 Min-No Report  Result Date: 07/18/2021 Fluoroscopy was utilized by the requesting physician.  No radiographic interpretation.               LOS: 5 days   Geradine Girt  [facility]   Pager on [email or web address]. If 7PM-7AM, please contact night-coverage at [email or web address]     07/19/2021, 11:37 AM

## 2021-07-19 NOTE — Progress Notes (Signed)
    Subjective:  Patient reports pain as mild to moderate.  Denies N/V/CP/SOB/Abd pain. She is doing well this morning and not having much pain just some soreness. She is currently icing her leg.   She states that her catheter was removed this morning. She has slight neuropathy at baseline and reports some more numbness in her left 5th tarsal than usual. Readjusted her sock this morning. Otherwise is at her baseline.   All questions solicited and answered in regards to going home and her care this morning.   Objective:   VITALS:   Vitals:   07/18/21 2044 07/18/21 2203 07/19/21 0138 07/19/21 0624  BP: (!) 137/59 (!) 129/58 (!) 114/55 (!) 126/53  Pulse: 61 66 60 (!) 57  Resp: $Remo'16 18 18 18  'aUzuf$ Temp:  (!) 97.4 F (36.3 C) 97.6 F (36.4 C) 97.8 F (36.6 C)  TempSrc:  Oral    SpO2: 99% 100% 100% 98%  Weight:      Height:        Patient is sitting up in bed this morning. NAD.  Neurologically intact ABD soft Neurovascular intact Intact pulses distally Dorsiflexion/Plantar flexion intact Incision: dressing C/D/I No cellulitis present Compartment soft Has slight neuropathy at baseline. Increased numbness reported in 5th left tarsal.   Lab Results  Component Value Date   WBC 9.0 07/19/2021   HGB 11.7 (L) 07/19/2021   HCT 36.0 07/19/2021   MCV 93.3 07/19/2021   PLT 173 07/19/2021   BMET    Component Value Date/Time   NA 140 07/19/2021 0357   NA 140 12/14/2020 1520   K 4.1 07/19/2021 0357   CL 110 07/19/2021 0357   CO2 24 07/19/2021 0357   GLUCOSE 143 (H) 07/19/2021 0357   BUN 16 07/19/2021 0357   BUN 10 12/14/2020 1520   CREATININE 0.59 07/19/2021 0357   CREATININE 0.76 12/14/2015 0942   CALCIUM 9.3 07/19/2021 0357   EGFR 76 12/14/2020 1520   GFRNONAA >60 07/19/2021 0357     Assessment/Plan: 1 Day Post-Op   Principal Problem:   Closed left hip fracture, initial encounter [facility]) Active Problems:   Sleep apnea-- Dr Maxwell Caul, on CPAP   Paroxysmal atrial  fibrillation (HCC)   Chronic diastolic CHF (congestive heart failure) ([place])   WBAT with walker DVT ppx: Xarelto 10 mg in hospital. She will resume xarelto $RemoveBefore'20mg'kaarHFeLMIoEo$  per her baseline at discharge, SCDs, TEDS PO pain control PT/OT: PT to come by today.  Dispo: Per medical team. PT to come by today. Her catheter was removed this morning. Pain medication printed in chart. She will resume xarelto $RemoveBefore'20mg'ofgbLoLIqiQoe$  per her baseline at discharge for DVT ppx.    Charlott Rakes, PA-C 07/19/2021, 7:31 AM  [facility]  [facility] [address], [address], [place], [place] [address] Phone: [phone] [email or web address] Facebook  [employer]

## 2021-07-19 NOTE — Progress Notes (Signed)
Patient decided that she now wants to stay the night.  Plan for d/c in the AM. JV

## 2021-07-19 NOTE — Evaluation (Signed)
Occupational Therapy Evaluation Patient Details Name: EMANUELLA NICKLE MRN: [id number] DOB: 20-Feb-1945 Today's Date: 07/19/2021   History of Present Illness 76 y.o. female who presented to the emergency department with severe left hip pain after a fall and underwent Left total hip arthroplasty, anterior approach on 07/18/21 with medical history significant for hypertension, atrial fibrillation on Xarelto, tachybradycardia syndrome with pacer, and BMI 35.   Clinical Impression   Patient is currently requiring assistance with ADLs including minimal assist with Lower body ADLs, setup assist with Upper body ADLs,  as well as  Minimal assist with bed mobility and min guard assist with functional transfers to toilet.  Current level of function is below patient's typical baseline.  During this evaluation, patient was limited by LLE post-op pain, which has the potential to impact patient's safety and independence during functional mobility, as well as performance for ADLs.  Patient lives at home, with her spouse who is able to provide 24/7 supervision and assistance.  Patient demonstrates good rehab potential, and should benefit from continued skilled occupational therapy services while in acute care to maximize safety, independence and quality of life at home.  ?       Recommendations for follow up therapy are one component of a multi-disciplinary discharge planning process, led by the attending physician.  Recommendations may be updated based on patient status, additional functional criteria and insurance authorization.   Follow Up Recommendations  No OT follow up    Assistance Recommended at Discharge Intermittent Supervision/Assistance  Patient can return home with the following A little help with bathing/dressing/bathroom;A little help with walking and/or transfers    Functional Status Assessment  Patient has had a recent decline in their functional status and demonstrates the ability to make  significant improvements in function in a reasonable and predictable amount of time.  Equipment Recommendations  BSC/3in1    Recommendations for Other Services       Precautions / Restrictions Precautions Precautions: Fall Restrictions Weight Bearing Restrictions: Yes LLE Weight Bearing: Weight bearing as tolerated      Mobility Bed Mobility Overal bed mobility: Needs Assistance Bed Mobility: Supine to Sit     Supine to sit: Min assist, HOB elevated     General bed mobility comments: Min assist to advance LLE off EOB with use of rails and cues for sequencing.    Transfers                          Balance Overall balance assessment: Needs assistance, History of Falls Sitting-balance support: Feet supported, No upper extremity supported Sitting balance-Leahy Scale: Good     Standing balance support: Reliant on assistive device for balance, During functional activity Standing balance-Leahy Scale: Poor                             ADL either performed or assessed with clinical judgement   ADL Overall ADL's : Needs assistance/impaired Eating/Feeding: Independent   Grooming: Set up;Sitting   Upper Body Bathing: Set up;Sitting   Lower Body Bathing: Supervison/ safety;Sitting/lateral leans;Sit to/from stand;With adaptive equipment Lower Body Bathing Details (indicate cue type and reason): Pt educated on long handled bath brush/sponge and use of shower chair. Upper Body Dressing : Sitting;Set up   Lower Body Dressing: Minimal assistance;Sit to/from stand;Sitting/lateral leans;Cueing for sequencing Lower Body Dressing Details (indicate cue type and reason): Pt educated on sequencing for ease starting with LLE for underwear/pant  and to sit whnever donning/doffing clothing over feet for safety. Pt educated on use of adaptive equipment for ease with dressing. Toilet Transfer: Agricultural engineer (2 wheels);BSC/3in1;Cueing for sequencing Hydrographic surveyor Details (indicate cue type and reason): Pt stood from EOB with Min guard and cue for hands/feet. Pt pivoted with RW frm EOB>BSC>Recliner with Min guard and cues for step sequence. Toileting- Clothing Manipulation and Hygiene: Set up;Sitting/lateral lean;Supervision/safety Toileting - Clothing Manipulation Details (indicate cue type and reason): Pt able to perform clothing management and performed anterior peri hygiene with setup of her own wipes. Pt asked about other adapive alternatives and educated on biudet attachments for hygiene ease with list of vendors.     Functional mobility during ADLs: Minimal assistance;Min guard;Rolling walker (2 wheels);Cueing for sequencing       Vision Baseline Vision/History: 1 Wears glasses Ability to See in Adequate Light: 0 Adequate Vision Assessment?: No apparent visual deficits     Perception Perception Perception: Within Functional Limits   Praxis Praxis Praxis: Intact    Pertinent Vitals/Pain Pain Assessment Pain Assessment: 0-10 Pain Score: 3  Pain Location: LT hip Pain Intervention(s): Ice applied, Limited activity within patient's tolerance, Premedicated before session     Hand Dominance Right   Extremity/Trunk Assessment Upper Extremity Assessment Upper Extremity Assessment: Overall WFL for tasks assessed   Lower Extremity Assessment Lower Extremity Assessment: Defer to PT evaluation   Cervical / Trunk Assessment Cervical / Trunk Assessment: Normal   Communication Communication Communication: No difficulties   Cognition Arousal/Alertness: Awake/alert Behavior During Therapy: WFL for tasks assessed/performed Overall Cognitive Status: Within Functional Limits for tasks assessed                                       General Comments       Exercises     Shoulder Instructions      Home Living Family/patient expects to be discharged to:: Private residence Living Arrangements: Spouse/significant  other;Children Available Help at Discharge: Family;Available 24 hours/day Type of Home: House Home Access: Ramped entrance;Other (comment) (Ramp +2 steps with a pull handle to enter the house on the Left)     Home Layout: Full bath on main level;Able to live on main level with bedroom/bathroom     Bathroom Shower/Tub: Occupational psychologist: Standard (Comfort height) Bathroom Accessibility: Yes How Accessible: Accessible via walker Home Equipment: Omer held shower head;Cane - single point;Rolling Walker (2 wheels)   Additional Comments: Adjustable bed, Lift recliner but in the baseline.      Prior Functioning/Environment Prior Level of Function : Independent/Modified Independent;History of Falls (last six months)               ADLs Comments: Likes to walk the dog. Retired ICU Nurse        OT Problem List: Pain;Impaired balance (sitting and/or standing);Decreased knowledge of use of DME or AE      OT Treatment/Interventions: Self-care/ADL training;Therapeutic activities;Patient/family education;DME and/or AE instruction;Balance training    OT Goals(Current goals can be found in the care plan section) Acute Rehab OT Goals Patient Stated Goal: To go home OT Goal Formulation: With patient/family Time For Goal Achievement: 08/02/21 Potential to Achieve Goals: Good ADL Goals Pt Will Perform Lower Body Dressing: with modified independence;with adaptive equipment;sit to/from stand;sitting/lateral leans Pt Will Transfer to Toilet: with modified independence;ambulating Pt Will Perform Toileting - Clothing Manipulation and hygiene: with  modified independence Additional ADL Goal #1: Pt will engage in room-based ambulation, collect clothing and demonstrate safe use of RW to carry clothing from one location to another with no more than supervision.  OT Frequency: Min 3X/week    Co-evaluation              AM-PAC OT "6 Clicks" Daily Activity      Outcome Measure Help from another person eating meals?: None Help from another person taking care of personal grooming?: A Little Help from another person toileting, which includes using toliet, bedpan, or urinal?: A Little Help from another person bathing (including washing, rinsing, drying)?: A Little Help from another person to put on and taking off regular upper body clothing?: A Little Help from another person to put on and taking off regular lower body clothing?: A Little 6 Click Score: 19   End of Session Equipment Utilized During Treatment: Gait belt;Rolling walker (2 wheels) Nurse Communication: Other (comment) (MD in room and cleared OT to work with pt.)  Activity Tolerance: Patient tolerated treatment well Patient left: in chair;with call bell/phone within reach;with family/visitor present  OT Visit Diagnosis: History of falling (Z91.81);Pain Pain - Right/Left: Left Pain - part of body: Hip                Time: 1660-6004 OT Time Calculation (min): 42 min Charges:  OT General Charges $OT Visit: 1 Visit OT Evaluation $OT Eval Low Complexity: 1 Low OT Treatments $Self Care/Home Management : 8-22 mins $Therapeutic Activity: 8-22 mins  Anderson Malta, OT Acute Rehab Services Office: [phone] 07/19/2021  Julien Girt 07/19/2021, 11:30 AM

## 2021-07-19 NOTE — TOC Transition Note (Signed)
Transition of Care [facility]) - CM/SW Discharge Note  Patient Details  Name: Veronica Bradford MRN: [id number] Date of Birth: January 30, 1945  Transition of Care [facility]) CM/SW Contact:  Sherie Don, LCSW Phone Number: 07/19/2021, 2:09 PM  Clinical Narrative: PT evaluation recommended HHPT. Patient and husband are agreeable to [facility] referral. CSW made referral to [facility] with [facility], but the agency will not have availability in patient's service area until next week. CSW made referral to Cindie with [facility], which was accepted. CSW updated patient. HH orders are in. TOC signing off.    Final next level of care: [place] Barriers to Discharge: Barriers Resolved  Patient Goals and CMS Choice Patient states their goals for this hospitalization and ongoing recovery are:: Discharge home with [place] CMS Medicare.gov Compare Post Acute Care list provided to:: Patient Choice offered to / list presented to : Patient  Discharge Plan and Services In-house Referral: Clinical Social Work         DME Arranged: N/A DME Agency: NA HH Arranged: PT HH Agency: [place] Date [facility] Agency Contacted: 07/19/21 Time [place]: 8032 Representative spoke with at [place]: Cindie  Readmission Risk Interventions     No data to display

## 2021-07-20 ENCOUNTER — Other Ambulatory Visit: Payer: Self-pay

## 2021-07-20 ENCOUNTER — Encounter (HOSPITAL_COMMUNITY): Payer: Self-pay | Admitting: Orthopedic Surgery

## 2021-07-20 ENCOUNTER — Telehealth: Payer: Self-pay | Admitting: Internal Medicine

## 2021-07-20 DIAGNOSIS — S72002A Fracture of unspecified part of neck of left femur, initial encounter for closed fracture: Secondary | ICD-10-CM | POA: Diagnosis not present

## 2021-07-20 MED ORDER — DILTIAZEM HCL ER COATED BEADS 180 MG PO CP24
180.0000 mg | ORAL_CAPSULE | Freq: Every day | ORAL | Status: DC
  Administered 2021-07-20: 180 mg via ORAL
  Filled 2021-07-20: qty 1

## 2021-07-20 MED ORDER — DILTIAZEM HCL 30 MG PO TABS
30.0000 mg | ORAL_TABLET | Freq: Once | ORAL | Status: AC
  Administered 2021-07-20: 30 mg via ORAL
  Filled 2021-07-20: qty 1

## 2021-07-20 MED ORDER — FLECAINIDE ACETATE 100 MG PO TABS
100.0000 mg | ORAL_TABLET | Freq: Once | ORAL | Status: AC
  Administered 2021-07-20: 100 mg via ORAL
  Filled 2021-07-20: qty 1

## 2021-07-20 NOTE — Progress Notes (Signed)
This RN called into Pt's room as NT was getting Pt up to [facility] and PT stated that she has "gone into Afib". EKG obtained and RRT RN [facility] called. Pt"s pain medication given, tele initiated, and Flecainide given per order. Please see notes by Leafy Ro RN/RRRN and J.Olena Heckle NP

## 2021-07-20 NOTE — Progress Notes (Signed)
Occupational Therapy Treatment Patient Details Name: Veronica Bradford MRN: [id number] DOB: 10/19/1945 Today's Date: 07/20/2021   History of present illness 76 y.o. female who presented to the emergency department with severe left hip pain after a fall and underwent Left total hip arthroplasty, anterior approach on 07/18/21 with medical history significant for hypertension, atrial fibrillation on Xarelto, tachybradycardia syndrome with pacer, and BMI 35.   OT comments  Patient was educated on AE for LB dressing/bathing tasks. Patient verbalized and demonstrated understanding. Patient was educated on bidet use for energy conservation at home. Patient verbalized understanding. Patient reported she would look into purchasing AE. Patient's discharge plan remains appropriate at this time. OT will continue to follow acutely.     Recommendations for follow up therapy are one component of a multi-disciplinary discharge planning process, led by the attending physician.  Recommendations may be updated based on patient status, additional functional criteria and insurance authorization.    Follow Up Recommendations  No OT follow up    Assistance Recommended at Discharge Intermittent Supervision/Assistance  Patient can return home with the following  A little help with bathing/dressing/bathroom;A little help with walking and/or transfers   Equipment Recommendations  BSC/3in1    Recommendations for Other Services      Precautions / Restrictions Precautions Precautions: Fall Precaution Comments: monitor HR Restrictions Weight Bearing Restrictions: No LLE Weight Bearing: Weight bearing as tolerated       Mobility Bed Mobility Overal bed mobility: Needs Assistance Bed Mobility: Supine to Sit     Supine to sit: Min guard, HOB elevated     General bed mobility comments: with increased time    Transfers                         Balance                                            ADL either performed or assessed with clinical judgement   ADL Overall ADL's : Needs assistance/impaired                       Lower Body Dressing Details (indicate cue type and reason): patient was educated on total hip kit, patient was able to doff socks with reacher with S with increased time. patient was supervision fo donning sock with sock aid with increased time. patient was educated on simulated donning pants from feet to knees with reacher with incresed time and min A to compelte with griper socks in place. patient was educated on importance of wearing shoes with backs to them over slide on shoes. patient verbalized understnading. Toilet Transfer: Min guard;Rolling walker (2 wheels);Ambulation Toilet Transfer Details (indicate cue type and reason): patietn was min guard to transfer from edge of bed to recliner in room with RW                Extremity/Trunk Assessment              Vision       Perception     Praxis      Cognition Arousal/Alertness: Awake/alert Behavior During Therapy: WFL for tasks assessed/performed Overall Cognitive Status: Within Functional Limits for tasks assessed  Exercises      Shoulder Instructions       General Comments      Pertinent Vitals/ Pain       Pain Assessment Pain Assessment: 0-10 Faces Pain Scale: Hurts little more Pain Location: L hip with initial WB Pain Descriptors / Indicators:  (feels like "molten lead poured on skin") Pain Intervention(s): Monitored during session, Limited activity within patient's tolerance, Repositioned, Premedicated before session  Home Living                                          Prior Functioning/Environment              Frequency  Min 3X/week        Progress Toward Goals  OT Goals(current goals can now be found in the care plan section)  Progress towards OT goals:  Progressing toward goals     Plan Discharge plan remains appropriate    Co-evaluation                 AM-PAC OT "6 Clicks" Daily Activity     Outcome Measure   Help from another person eating meals?: None Help from another person taking care of personal grooming?: A Little Help from another person toileting, which includes using toliet, bedpan, or urinal?: A Little Help from another person bathing (including washing, rinsing, drying)?: A Little Help from another person to put on and taking off regular upper body clothing?: A Little Help from another person to put on and taking off regular lower body clothing?: A Little 6 Click Score: 19    End of Session Equipment Utilized During Treatment: Gait belt;Rolling walker (2 wheels)  OT Visit Diagnosis: History of falling (Z91.81);Pain Pain - Right/Left: Left Pain - part of body: Hip   Activity Tolerance Patient tolerated treatment well   Patient Left in chair;with call bell/phone within reach;with family/visitor present   Nurse Communication          Time: 4196-2229 OT Time Calculation (min): 43 min  Charges: OT Treatments $Self Care/Home Management : 38-52 mins  Jackelyn Poling OTR/L, MS Acute Rehabilitation Department Office# [phone] Pager# [phone]   Marcellina Millin 07/20/2021, 3:46 PM

## 2021-07-20 NOTE — Plan of Care (Signed)
  Problem: Education: Goal: Knowledge of General Education information will improve Description: Including pain rating scale, medication(s)/side effects and non-pharmacologic comfort measures Outcome: Progressing   Problem: Health Behavior/Discharge Planning: Goal: Ability to manage health-related needs will improve Outcome: Progressing   Problem: Clinical Measurements: Goal: Ability to maintain clinical measurements within normal limits will improve Outcome: Progressing Goal: Cardiovascular complication will be avoided Outcome: Progressing   Problem: Clinical Measurements: Goal: Cardiovascular complication will be avoided Outcome: Progressing   Problem: Activity: Goal: Risk for activity intolerance will decrease Outcome: Progressing   Problem: Nutrition: Goal: Adequate nutrition will be maintained Outcome: Progressing   Problem: Elimination: Goal: Will not experience complications related to urinary retention Outcome: Adequate for Discharge   Problem: Pain Managment: Goal: General experience of comfort will improve Outcome: Progressing

## 2021-07-20 NOTE — Significant Event (Signed)
Rapid Response Event Note   Reason for Call :  Alerted by unit charge nurse that patient in a-fib with RVR with sustained rate around 120.  Initial Focused Assessment:  Patient alert, oriented, and in hospital s/p left total hip arthroplasty. Patient able to recount her entire medical history including a-fib and prescribed medication regimen. Patient recounts severe bout of pain and delayed Diltiazem administration as causative factor in her increased irregular heart rate. Patient states she feels and hears every heart beat so she knew she was in a-fib. Vital signs are within appropriate range and heart rate has decreased below 100 since Diltiazem administration. Patient denies any chest pain, respiratory distress, or anxiety which correlates with objective observations. EKG verifies a-fib, but no further rapid ventricular response.   Interventions:  Assessed vital signs, focused review of systems, and repeated EKG. Unit charge nurse administered pain medication during this event. Provider notified and came to bedside (please refer to that progress report.) Telemetry monitoring ordered and implemented by unit charge nurse.   Plan of Care:  Patient to remain in current assignment with continuous telemetry monitoring. Patient knows to alert staff to any change in how she feels. Provider ordered heart medications to match prior to admission administration orders that allow for additional dilitiazem and flecainide doses for similar events.    Event Summary:   MD Notified: Gershon Cull, NP [phone] Call Time: 07/19/21 @ 2351 Arrival Time: 07/19/21 @ 2355 End Time: 0019 (provider remained at bedside)  Selinda Michaels, RN

## 2021-07-20 NOTE — Progress Notes (Addendum)
Progress Note    Veronica Bradford  QIO:[id number] DOB: 1945-11-14  DOA: 07/14/2021 PCP: Colon Branch, MD      Brief Narrative:    Medical records reviewed and are as summarized below:    76 y.o. female with medical history significant for hypertension, atrial fibrillation on Xarelto, tachybradycardia syndrome with pacer, and BMI 35, who presented to the emergency department with severe left hip pain after a fall.  Patient reports that she was in her usual state of health and having an uneventful day when she tripped and fell directly onto her left hip.  She experienced severe pain when she tried to bear weight and suspected she had suffered a fracture.  Patient reports that she walks 1.5 to 2 miles daily and never experiences shortness of breath or chest pain with that.  She had a nuclear stress test in August 2021 that was a low risk, normal study.   Plain films of the hip and pelvis demonstrate impacted subcapital left femoral neck fracture with minimal displacement.  Orthopedic surgery was consulted. S/p surgery 7/17      Assessment/Plan:   Principal Problem:   Closed left hip fracture, initial encounter [facility]) Active Problems:   Sleep apnea-- Dr Maxwell Caul, on CPAP   Paroxysmal atrial fibrillation (HCC)   Chronic diastolic CHF (congestive heart failure) ([place])     Left hip fracture, s/p mechanical fall Continue analgesics as needed for pain.  S/p  Left total hip arthroplasty on 07/18/2021. -PT/OT eval recommending home health   PAF /SSS s/p pace maker placement Continue flecainide and Cardizem -resume xarleto -In A-fib RVR on 7/19 a.m., patient requested cardiology consult, order placed  Chronic HFpEF  Compensated.  Preserved EF on echo in February 2023.  Hypokalemia  -repleted   OSA  - Continue CPAP qHS     Obesity Estimated body mass index is 34.96 kg/m as calculated from the following:   Height as of this encounter: '5\' 8"'$  (1.727 m).   Weight as of this  encounter: 104.3 kg.     Consultants: Ortho Cardiology    Medications:    Chlorhexidine Gluconate Cloth  6 each Topical Q0600   Chlorhexidine Gluconate Cloth  6 each Topical Daily   diltiazem  120 mg Oral Daily   docusate sodium  100 mg Oral BID   flecainide  100 mg Oral BID   mupirocin ointment  1 Application Nasal BID   mupirocin ointment  1 Application Nasal BID   pantoprazole  40 mg Oral Daily   pravastatin  40 mg Oral q1800   Ensure Max Protein  11 oz Oral Daily   rivaroxaban  10 mg Oral Q supper   senna  1 tablet Oral BID   triamterene-hydrochlorothiazide  0.5 tablet Oral Daily   Continuous Infusions:  methocarbamol (ROBAXIN) IV       Anti-infectives (From admission, onward)    Start     Dose/Rate Route Frequency Ordered Stop   07/18/21 2300  ceFAZolin (ANCEF) IVPB 2g/100 mL premix        2 g 200 mL/hr over 30 Minutes Intravenous Every 6 hours 07/18/21 2050 07/19/21 0453   07/18/21 0815  ceFAZolin (ANCEF) IVPB 2g/100 mL premix        2 g 200 mL/hr over 30 Minutes Intravenous On call to O.R. 07/18/21 0723 07/18/21 1724              Family Communication/Anticipated D/C date and plan/Code Status   DVT prophylaxis:  rivaroxaban (XARELTO) tablet 10 mg Start: 07/19/21 1300 SCDs Start: 07/18/21 2051 rivaroxaban (XARELTO) tablet 10 mg     Code Status: Full Code  Disposition Plan: home with home health once cleared by cardiology   Status is: Inpatient Remains inpatient appropriate because: hip surgery on 07/18/2021       Subjective:   Went in to afib/rvr last night, denies chest pain, no sob at rest She does not feel like she can go home today, she requested to see cardiology  Objective:    Vitals:   07/19/21 1358 07/19/21 2026 07/20/21 0319 07/20/21 0539  BP: (!) 118/49 (!) 125/45 (!) 117/49 (!) 137/56  Pulse: 63 60 100 70  Resp: '16 18 18 18  '$ Temp: 98.3 F (36.8 C) 98.1 F (36.7 C) 98.5 F (36.9 C) 98 F (36.7 C)  TempSrc: Oral Oral  Oral Oral  SpO2: 100% 99% 97% 100%  Weight:      Height:       No data found.   Intake/Output Summary (Last 24 hours) at 07/20/2021 1005 Last data filed at 07/19/2021 1704 Gross per 24 hour  Intake 240 ml  Output --  Net 240 ml   Filed Weights   07/14/21 2118  Weight: 104.3 kg    Exam:   General: Appearance:    Obese female in no acute distress     Lungs:      respirations unlabored  Heart:    IRRR  MS:   Right hip post op changes   Neurologic:   Awake, alert, oriented x 3. No apparent focal neurological           defect.      Data Reviewed:   I have personally reviewed following labs and imaging studies:  Labs: Labs show the following:   Basic Metabolic Panel: Recent Labs  Lab 07/14/21 1515 07/15/21 0418 07/16/21 0328 07/17/21 0329 07/19/21 0357  NA 140 143 140 140 140  K 3.3* 3.7 3.9 3.7 4.1  CL 104 112* 108 109 110  CO2 '28 28 25 27 24  '$ GLUCOSE 99 108* 116* 112* 143*  BUN '17 17 18 21 16  '$ CREATININE 0.77 0.63 0.58 0.64 0.59  CALCIUM 10.5* 10.2 10.3 10.1 9.3  MG  --  2.2  --   --   --    GFR Estimated Creatinine Clearance: 79.2 mL/min (by C-G formula based on SCr of 0.59 mg/dL). Liver Function Tests: No results for input(s): "AST", "ALT", "ALKPHOS", "BILITOT", "PROT", "ALBUMIN" in the last 168 hours. No results for input(s): "LIPASE", "AMYLASE" in the last 168 hours. No results for input(s): "AMMONIA" in the last 168 hours. Coagulation profile Recent Labs  Lab 07/14/21 1515  INR 1.2    CBC: Recent Labs  Lab 07/14/21 1515 07/15/21 0418 07/16/21 0328 07/17/21 0329 07/19/21 0357  WBC 8.1 6.7 6.5 6.3 9.0  NEUTROABS 6.0  --   --   --   --   HGB 14.3 12.3 12.4 12.2 11.7*  HCT 42.8 37.5 39.4 37.5 36.0  MCV 90.1 92.1 95.4 92.6 93.3  PLT 210 193 173 179 173   Cardiac Enzymes: No results for input(s): "CKTOTAL", "CKMB", "CKMBINDEX", "TROPONINI" in the last 168 hours. BNP (last 3 results) No results for input(s): "PROBNP" in the last 8760  hours. CBG: No results for input(s): "GLUCAP" in the last 168 hours. D-Dimer: No results for input(s): "DDIMER" in the last 72 hours. Hgb A1c: No results for input(s): "HGBA1C" in the last 72 hours. Lipid  Profile: No results for input(s): "CHOL", "HDL", "LDLCALC", "TRIG", "CHOLHDL", "LDLDIRECT" in the last 72 hours. Thyroid function studies: No results for input(s): "TSH", "T4TOTAL", "T3FREE", "THYROIDAB" in the last 72 hours.  Invalid input(s): "FREET3" Anemia work up: No results for input(s): "VITAMINB12", "FOLATE", "FERRITIN", "TIBC", "IRON", "RETICCTPCT" in the last 72 hours. Sepsis Labs: Recent Labs  Lab 07/15/21 0418 07/16/21 0328 07/17/21 0329 07/19/21 0357  WBC 6.7 6.5 6.3 9.0    Microbiology Recent Results (from the past 240 hour(s))  Surgical pcr screen     Status: Abnormal   Collection Time: 07/16/21  6:22 AM   Specimen: Nasal Mucosa; Nasal Swab  Result Value Ref Range Status   MRSA, PCR NEGATIVE NEGATIVE Final   Staphylococcus aureus POSITIVE (A) NEGATIVE Final    Comment: (NOTE) The Xpert SA Assay (FDA approved for NASAL specimens in patients 76 years of age and older), is one component of a comprehensive surveillance program. It is not intended to diagnose infection nor to guide or monitor treatment. Performed at [facility], [place] [address], [place], [place] [address]     Procedures and diagnostic studies:  DG Pelvis Portable  Result Date: 07/18/2021 CLINICAL DATA:  Left hip replacement EXAM: PORTABLE PELVIS 1-2 VIEWS COMPARISON:  03/09/2020 FINDINGS: Total hip arthroplasty on the left. Components appear well positioned. No radiographically detectable complication. IMPRESSION: Good appearance following total hip arthroplasty on the left. Electronically Signed   By: Nelson Chimes M.D.   On: 07/18/2021 19:45   DG HIP PORT UNILAT WITH PELVIS 1V LEFT  Result Date: 07/18/2021 CLINICAL DATA:  Total hip arthroplasty EXAM: DG HIP (WITH OR  WITHOUT PELVIS) 1V PORT LEFT COMPARISON:  07/14/2021 FINDINGS: Two C-arm images show total arthroplasty on the left. Components appear well position. No radiographically detectable complication. IMPRESSION: Good appearance following total hip arthroplasty on the left. Electronically Signed   By: Nelson Chimes M.D.   On: 07/18/2021 18:54   DG C-Arm 1-60 Min-No Report  Result Date: 07/18/2021 Fluoroscopy was utilized by the requesting physician.  No radiographic interpretation.               LOS: 6 days   Florencia Reasons MD PhD FACP [facility]   Pager on [email or web address]. If 7PM-7AM, please contact night-coverage at [email or web address]     07/20/2021, 10:05 AM

## 2021-07-20 NOTE — Progress Notes (Signed)
    OVERNIGHT PROGRESS REPORT  Notified by RR RN and assigned Nurse for elevated HR.  Patient is in a-fib with a sustained rate over 100 bpm Pain control was completed (6/10)  Patient has produced charted information that directs her to take additional Flecainide for sustained HR > 100 ( a-fib). Verified.  BP remains stable and patient is calm and communicative. Pain control administered and Telemetry is ordered    Gershon Cull MSNA MSN ACNPC-AG Acute Care Nurse Practitioner [place]

## 2021-07-20 NOTE — Plan of Care (Signed)
  Problem: Education: Goal: Knowledge of General Education information will improve Description: Including pain rating scale, medication(s)/side effects and non-pharmacologic comfort measures Outcome: Progressing   Problem: Health Behavior/Discharge Planning: Goal: Ability to manage health-related needs will improve Outcome: Progressing   Problem: Clinical Measurements: Goal: Ability to maintain clinical measurements within normal limits will improve Outcome: Progressing Goal: Will remain free from infection Outcome: Progressing Goal: Diagnostic test results will improve Outcome: Progressing Goal: Respiratory complications will improve Outcome: Progressing Goal: Cardiovascular complication will be avoided Outcome: Progressing   Problem: Activity: Goal: Risk for activity intolerance will decrease Outcome: Progressing   Problem: Nutrition: Goal: Adequate nutrition will be maintained Outcome: Progressing   Problem: Coping: Goal: Level of anxiety will decrease Outcome: Progressing   Problem: Elimination: Goal: Will not experience complications related to bowel motility Outcome: Progressing Goal: Will not experience complications related to urinary retention Outcome: Progressing   Problem: Pain Managment: Goal: General experience of comfort will improve Outcome: Progressing   Problem: Safety: Goal: Ability to remain free from injury will improve Outcome: Progressing   Problem: Education: Goal: Knowledge of the prescribed therapeutic regimen will improve Outcome: Progressing Goal: Understanding of discharge needs will improve Outcome: Progressing Goal: Individualized Educational Video(s) Outcome: Progressing   Problem: Activity: Goal: Ability to avoid complications of mobility impairment will improve Outcome: Progressing Goal: Ability to tolerate increased activity will improve Outcome: Progressing   Problem: Pain Management: Goal: Pain level will decrease with  appropriate interventions Outcome: Progressing   Problem: Clinical Measurements: Goal: Postoperative complications will be avoided or minimized Outcome: Progressing

## 2021-07-20 NOTE — Telephone Encounter (Signed)
Called pt expresses that cardiologist is currently in hospital room.  No further needs at this time.

## 2021-07-20 NOTE — Progress Notes (Signed)
    Subjective:  Patient reports pain as mild to moderate.  Denies N/V/CP/SOB/Abd pain. She had planned to go home yesterday but was worried about her pain. So she was kept overnight. She had some concerns yesterday about numbness in the thigh that we discussed is normal postoperatively.   She had an episode of afib with RVR overnight that was managed medically and she was placed on telemetry. Patient's heart rate controlled this morning, she states she is feeling a little better.  She is currently icing her leg this morning. All questions solicited and answered in regards to going home and her care this morning.   Objective:   VITALS:   Vitals:   07/19/21 1358 07/19/21 2026 07/20/21 0319 07/20/21 0539  BP: (!) 118/49 (!) 125/45 (!) 117/49 (!) 137/56  Pulse: 63 60 100 70  Resp: $Remo'16 18 18 18  'ZYRpN$ Temp: 98.3 F (36.8 C) 98.1 F (36.7 C) 98.5 F (36.9 C) 98 F (36.7 C)  TempSrc: Oral Oral Oral Oral  SpO2: 100% 99% 97% 100%  Weight:      Height:        Patient lying comfortably in bed. NAD.  Neurologically intact ABD soft Neurovascular intact Sensation intact distally Intact pulses distally Dorsiflexion/Plantar flexion intact Incision: dressing C/D/I No cellulitis present Compartment soft   Lab Results  Component Value Date   WBC 9.0 07/19/2021   HGB 11.7 (L) 07/19/2021   HCT 36.0 07/19/2021   MCV 93.3 07/19/2021   PLT 173 07/19/2021   BMET    Component Value Date/Time   NA 140 07/19/2021 0357   NA 140 12/14/2020 1520   K 4.1 07/19/2021 0357   CL 110 07/19/2021 0357   CO2 24 07/19/2021 0357   GLUCOSE 143 (H) 07/19/2021 0357   BUN 16 07/19/2021 0357   BUN 10 12/14/2020 1520   CREATININE 0.59 07/19/2021 0357   CREATININE 0.76 12/14/2015 0942   CALCIUM 9.3 07/19/2021 0357   EGFR 76 12/14/2020 1520   GFRNONAA >60 07/19/2021 0357     Assessment/Plan: 2 Days Post-Op   Principal Problem:   Closed left hip fracture, initial encounter [facility]) Active Problems:    Sleep apnea-- Dr Maxwell Caul, on CPAP   Paroxysmal atrial fibrillation (HCC)   Chronic diastolic CHF (congestive heart failure) ([place])   WBAT with walker DVT ppx: Xarelto 10 mg today. Will resume xarelto $RemoveBefore'20mg'lEJksTMZQlTkD$  per baseline tomorrow, SCDs, TEDS PO pain control PT/OT: She ambulated 120 feet with PT yesterday. PT to come today.  Dispo: Disposition per medical team. Pain medication printed in chart. She will resume xarelto $RemoveBefore'20mg'aTrFQuLbORHtv$  per her baseline at discharge for DVT ppx.   Charlott Rakes, PA-C 07/20/2021, 7:35 AM  [facility]  [place] [address], [address], [place], [place] [address] Phone: [phone] [email or web address]  [employer]

## 2021-07-20 NOTE — Progress Notes (Signed)
Physical Therapy Treatment Patient Details Name: Veronica Bradford MRN: [id number] DOB: [birth year] Today's Date: 07/20/2021   History of Present Illness 76 y.o. female who presented to the emergency department with severe left hip pain after a fall and underwent Left total hip arthroplasty, anterior approach on 07/18/21 with medical history significant for hypertension, atrial fibrillation on Xarelto, tachybradycardia syndrome with pacer, and BMI 35.    PT Comments    Pt limited by pain and fatigue this pm. Continues to be ready for d/c from PT standpoint. Husband and son present for end of session.    Recommendations for follow up therapy are one component of a multi-disciplinary discharge planning process, led by the attending physician.  Recommendations may be updated based on patient status, additional functional criteria and insurance authorization.  Follow Up Recommendations  Home health PT     Assistance Recommended at Discharge Intermittent Supervision/Assistance  Patient can return home with the following Assistance with cooking/housework;Assist for transportation;Help with stairs or ramp for entrance   Equipment Recommendations  None recommended by PT    Recommendations for Other Services       Precautions / Restrictions Precautions Precautions: Fall Precaution Comments: monitor HR Restrictions Weight Bearing Restrictions: No LLE Weight Bearing: Weight bearing as tolerated Other Position/Activity Restrictions: WBAT     Mobility  Bed Mobility Overal bed mobility: Needs Assistance Bed Mobility: Sit to Supine       Sit to supine: Min assist, Min guard   General bed mobility comments: incr time, assist to lift LLE, pt assisting with gait betl. cues for sequence    Transfers Overall transfer level: Needs assistance Equipment used: Rolling walker (2 wheels) Transfers: Sit to/from Stand, Bed to chair/wheelchair/BSC Sit to Stand: Min guard   Step pivot transfers:  Min guard       General transfer comment: verbal cues for hand placement. pt self cues end of session; min/guard for safety    Ambulation/Gait Ambulation/Gait assistance: Supervision Gait Distance (Feet): 35 Feet Assistive device: Rolling walker (2 wheels) Gait Pattern/deviations: Step-to pattern, Decreased stance time - left       General Gait Details: initial cues for sequence,  pt demonstrates good carryover during session for correct sequence and RW position   Stairs             Wheelchair Mobility    Modified Rankin (Stroke Patients Only)       Balance Overall balance assessment: Needs assistance, History of Falls   Sitting balance-Leahy Scale: Good       Standing balance-Leahy Scale: Fair Standing balance comment: pt is able to static stand without UE support                            Cognition Arousal/Alertness: Awake/alert Behavior During Therapy: WFL for tasks assessed/performed Overall Cognitive Status: Within Functional Limits for tasks assessed                                          Exercises Total Joint Exercises Heel Slides:  (reviewed heelslides and use of gait belt to assist)    General Comments        Pertinent Vitals/Pain Pain Assessment Pain Assessment: 0-10 Pain Score: 4  Pain Location: L hip with initial WB Pain Descriptors / Indicators: Burning, Sore Pain Intervention(s): Limited activity within patient's tolerance, Monitored during session,  Premedicated before session, Repositioned    Home Living                          Prior Function            PT Goals (current goals can now be found in the care plan section) Acute Rehab PT Goals Patient Stated Goal: home soon PT Goal Formulation: With patient Time For Goal Achievement: 07/26/21 Potential to Achieve Goals: Good Progress towards PT goals: Progressing toward goals    Frequency    7X/week      PT Plan Current plan  remains appropriate    Co-evaluation              AM-PAC PT "6 Clicks" Mobility   Outcome Measure  Help needed turning from your back to your side while in a flat bed without using bedrails?: A Little Help needed moving from lying on your back to sitting on the side of a flat bed without using bedrails?: A Little Help needed moving to and from a bed to a chair (including a wheelchair)?: A Little Help needed standing up from a chair using your arms (e.g., wheelchair or bedside chair)?: A Little Help needed to walk in hospital room?: A Little Help needed climbing 3-5 steps with a railing? : A Little 6 Click Score: 18    End of Session Equipment Utilized During Treatment: Gait belt Activity Tolerance: Patient limited by fatigue;Patient limited by pain Patient left: in bed;with call bell/phone within reach;with bed alarm set;with family/visitor present Nurse Communication: Mobility status PT Visit Diagnosis: Other abnormalities of gait and mobility (R26.89);Difficulty in walking, not elsewhere classified (R26.2)     Time: 2440-1027 PT Time Calculation (min) (ACUTE ONLY): 28 min  Charges:  $Gait Training: 23-37 mins                     Baxter Flattery, PT  Acute Rehab Dept [facility]) [phone]  [facility] Weekend Pager [facility] only)  [phone]  07/20/2021    [facility] 07/20/2021, 5:03 PM

## 2021-07-20 NOTE — Plan of Care (Signed)
  Problem: Education: Goal: Knowledge of General Education information will improve Description: Including pain rating scale, medication(s)/side effects and non-pharmacologic comfort measures Outcome: Progressing   Problem: Health Behavior/Discharge Planning: Goal: Ability to manage health-related needs will improve Outcome: Progressing   Problem: Clinical Measurements: Goal: Ability to maintain clinical measurements within normal limits will improve Outcome: Progressing Goal: Will remain free from infection Outcome: Progressing   Problem: Activity: Goal: Risk for activity intolerance will decrease Outcome: Progressing   Problem: Pain Managment: Goal: General experience of comfort will improve Outcome: Progressing   Problem: Coping: Goal: Level of anxiety will decrease Outcome: Progressing   Problem: Nutrition: Goal: Adequate nutrition will be maintained Outcome: Progressing

## 2021-07-20 NOTE — Consult Note (Addendum)
Cardiology Consultation:   Patient ID: CADY HAFEN MRN: [id number]; DOB: 07-29-1945  Admit date: 07/14/2021 Date of Consult: 07/20/2021  PCP:  Colon Branch, MD   [place] Providers Cardiologist:  Virl Axe, MD   {   Patient Profile:   TALITA RECHT is a 76 y.o. 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  History of Present Illness:   Ms. Armenteros with above PMH is currently admitted for left femoral neck fracture following a mechanical fall at [facility]. She underwent left total hip arthroplasty on 07/18/21 by Deeann Dowse. She was planned for discharge on 07/19/21, but later that evening she went into A fib with RVR with HR 120s reported by RRT nurse. Cardiology is consulted today for further input.   She follows [name] for A fib, initially diagnosed in 08/2013 during ER visit for paroxysmal A fib RVR . She had diarrhea that was felt related to apixaban, later started on rivaroxaban for anticoagulation. She was started on flecainide pill in the pocket strategy at the time. Later PRN propafenone was added.  2018 she was noted with sinus bradycardia, with baseline PVCs 0-14% burden. She had increased frequency and duration of A fib despite above meds, with fatigue and palpitations. She was referred to Dr Rayann Heman for EP study and catheter ablation on 09/04/17, discharged home with PRN diltiazem, flecainide, and propafenone. With conduction disease, daily flecainide was avoided. Later she felt her A fib frequency is not much changed since ablation, occurrence seems to relate with with her IBS/diarrhea symptoms. She developed persistent atrial flutter/fib and placed on flecainide '50mg'$  BID 02/05/20. On  09/13/20 she was admitted for  symptomatic sinus bradycardia with sinus node dysfunction and junctional rhythm, had PPM implanted.   She was last seen by [name] 02/03/21, was in sinus rhythm, continued on flecainide 50 mg BID and cardiazm CD '120mg'$  daily . She had self terminating A fib episodes reported.  Dofetilide was considered, plan was to try flecainide at higher dose '100mg'$  BID first.   Last Echo from 02/11/21 with LVEF 65-70%, no RWMA, moderate asymmetric left ventricular  hypertrophy of the basal-septal segment, normal RV. Trivial MR.   Last NM stress myoview on 08/08/19 revealed low risk study with no ischemia.    Past Medical History:  Diagnosis Date   Atrial fibrillation ([place])    Back pain    Chronic sinusitis    s/p 2 surgeries remotely, [name]   Closed fracture of metatarsal of left foot    L foot, fifth metatarsal   Ectopic pregnancy    Endometrial cancer ([place]) 2004   Early diagnosis   GERD (gastroesophageal reflux disease)    Hyperlipemia    Hypertension    IBS (irritable bowel syndrome) 08/13/2014   Dx 2015, diarrhea on-off, Dr [name]    Lumbar radiculopathy 08/2012   MRI in 08/2012   Mild hyperparathyroidism ([place])    Osteopenia    PAF (paroxysmal atrial fibrillation) (HCC)    Prediabetes    Sciatica of right side 08/2013   Received Depomedrol 80 mg injection   Sleep apnea 09/09/2013   Dx with OSA in 2015, by Dr. Caryl Comes, APAP    Past Surgical History:  Procedure Laterality Date   ABDOMINAL HYSTERECTOMY     ATRIAL FIBRILLATION ABLATION N/A 09/04/2017   Procedure: ATRIAL FIBRILLATION ABLATION;  Surgeon: [name], MD;  Location: [facility];  Service: Cardiovascular;  Laterality: N/A;   BIOPSY  03/11/2020   Procedure: BIOPSY;  Surgeon: [name], MD;  Location: [facility];  Service: Endoscopy;;  EGD and COLON   CARPAL TUNNEL RELEASE Right 06/17/2019   CARPAL TUNNEL RELEASE Left 11/2020   CHOLECYSTECTOMY  ~2006   COLONOSCOPY WITH PROPOFOL N/A 03/11/2020   Procedure:  COLONOSCOPY WITH PROPOFOL;  Surgeon: [name], MD;  Location: [facility];  Service: Endoscopy;  Laterality: N/A;   DILATION AND CURETTAGE OF UTERUS     after SAB   ESOPHAGOGASTRODUODENOSCOPY  02/09/2014   Dr. 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/2020   Procedure: PACEMAKER IMPLANT;  Surgeon: [name], MD;  Location: [facility];  Service: Cardiovascular;  Laterality: N/A;   TONSILLECTOMY     TOTAL ABDOMINAL HYSTERECTOMY W/ BILATERAL SALPINGOOPHORECTOMY     TOTAL KNEE ARTHROPLASTY Right ~2008     Home Medications:  Prior to Admission medications   Medication Sig Start Date End Date Taking? Authorizing Provider  acetaminophen (TYLENOL) 500 MG tablet Take 500 mg by mouth every 6 (six) hours as needed for moderate pain or headache.   Yes [provider]  acetaminophen-codeine (TYLENOL #3) 300-30 MG tablet Take 1 tablet by mouth daily as needed for moderate pain. 05/31/21  Yes Paz, Alda Berthold, MD  ARTIFICIAL TEAR SOLUTION OP Place 1 drop into both eyes 4 (four) times daily.   Yes [provider]  cholecalciferol (VITAMIN D3) 25 MCG (1000 UNIT) tablet Take 1,000 Units by mouth daily.   Yes [provider]  CRANBERRY EXTRACT PO Take 2 tablets by mouth daily.   Yes [provider]  Cyanocobalamin (VITAMIN B 12 PO) Take 1 tablet by mouth daily.   Yes [provider]  diltiazem (CARDIZEM CD) 120 MG 24 hr capsule TAKE 1 CAPSULE(120 MG) BY MOUTH DAILY Patient taking differently: Take 120 mg by mouth daily. 04/06/21  Yes [name], MD  Ferrous Sulfate (IRON PO) Take 1 tablet by mouth daily.   Yes [provider]  flecainide  (TAMBOCOR) 100 MG tablet Take 1 tablet (100 mg total) by mouth 2 (two) times daily. 02/03/21  Yes [name], MD  fluticasone [facility]) 50 MCG/ACT nasal spray Place 1 spray into both nostrils every other day.   Yes [provider]  FOLIC ACID PO Take 1 tablet by mouth daily.   Yes [provider]  furosemide (LASIX) 40 MG tablet Take 1 tablet (40 mg total) by mouth daily. 01/04/21  Yes [name], MD  HYDROcodone-acetaminophen [facility]) 10-325 MG tablet Take 0.5 tablets by mouth every 4 (four) hours as needed for up to 7 days for severe pain or moderate pain. 07/19/21 07/26/21 Yes Hill, Marciano Sequin, PA-C  Omega-3 Fatty Acids (FISH OIL PO) Take 1 tablet by mouth daily.   Yes [provider]  pantoprazole (PROTONIX) 40 MG tablet Take 1 tablet (40 mg total) by mouth 2 (two) times daily. Patient taking differently: Take  40 mg by mouth daily. 01/27/21  Yes Sherran Needs, NP  pravastatin (PRAVACHOL) 40 MG tablet TAKE 1 TABLET(40 MG) BY MOUTH AT BEDTIME Patient taking differently: Take 40 mg by mouth daily. 05/10/21  Yes Paz, Alda Berthold, MD  rivaroxaban (XARELTO) 10 MG TABS tablet Take 1 tablet (10 mg total) by mouth daily for 2 days. 07/19/21 07/21/21 Yes Vann, Tomi Bamberger, DO  triamterene-hydrochlorothiazide (ZJIRCVE-93) 37.5-25 MG tablet TAKE 1/2 TABLET BY MOUTH DAILY Patient taking differently: Take 0.5 tablets by mouth daily. 06/14/21  Yes Paz, Alda Berthold, MD  diltiazem (CARDIZEM) 30 MG tablet Take 1 tablet every 4 hours AS NEEDED for heart rate >100 as long as blood pressure >100. 01/27/21   Sherran Needs, NP  rivaroxaban (XARELTO) 20 MG TABS tablet Take 1 tablet (20 mg total) by mouth daily with supper. 07/22/21   Geradine Girt, DO  Tdap ([place]) 5-2.5-18.5 LF-MCG/0.5 injection Inject into the muscle. 03/23/21   Carlyle Basques, MD    Inpatient Medications: Scheduled Meds:  Chlorhexidine Gluconate Cloth  6 each Topical Q0600   Chlorhexidine Gluconate Cloth  6 each Topical Daily    diltiazem  120 mg Oral Daily   docusate sodium  100 mg Oral BID   flecainide  100 mg Oral BID   mupirocin ointment  1 Application Nasal BID   mupirocin ointment  1 Application Nasal BID   pantoprazole  40 mg Oral Daily   pravastatin  40 mg Oral q1800   Ensure Max Protein  11 oz Oral Daily   rivaroxaban  10 mg Oral Q supper   senna  1 tablet Oral BID   triamterene-hydrochlorothiazide  0.5 tablet Oral Daily   Continuous Infusions:  methocarbamol (ROBAXIN) IV     PRN Meds: alum & mag hydroxide-simeth, diphenhydrAMINE, HYDROcodone-acetaminophen, menthol-cetylpyridinium **OR** phenol, methocarbamol **OR** methocarbamol (ROBAXIN) IV, metoCLOPramide **OR** metoCLOPramide (REGLAN) injection, morphine injection, ondansetron **OR** ondansetron (ZOFRAN) IV, polyethylene glycol  Allergies:    Allergies  Allergen Reactions   Ibuprofen Other (See Comments)    Pt is on Xarelto   Lactose Intolerance (Gi) Diarrhea and Nausea And Vomiting   Lisinopril Cough   Nsaids Other (See Comments)    Pt on Xarelto   Tape     Causes blisters    Tolmetin Other (See Comments)    Pt on Xarelto   Zoster Vaccine Live Other (See Comments)    Serum sickness, high fever, neck pain    Amoxicillin Rash   Penicillins Rash    Local reaction    Social History:   Social History   Socioeconomic History   Marital status: Married    Spouse name: Not on file   Number of children: 1   Years of education: Not on file   Highest education level: Not on file  Occupational History   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  Tobacco Use   Smoking status: Former    Types: Cigarettes    Quit date: 01/06/1980    Years since quitting: 41.5   Smokeless tobacco: Never   Tobacco comments:    smoked from [place] to 1982, less than 1 ppd  Vaping Use   Vaping Use: Never used  Substance and Sexual Activity   Alcohol use: Yes    Comment: rare   Drug use: No   Sexual activity: Not Currently    Birth control/protection: None   Other Topics Concern   Not on file  Social History Narrative   Lives w/ husband, and son [name]  Retired Marine scientist   Lives in [place] Strain: [place]  (06/08/2020)   Overall Financial Resource Strain (CARDIA)    Difficulty of Paying Living Expenses: Not hard at all  Food Insecurity: No Food Insecurity (06/08/2020)   Hunger Vital Sign    Worried About Running Out of Food in the Last Year: Never true    [place] in the Last Year: Never true  Transportation Needs: No Transportation Needs (06/08/2020)   PRAPARE - Hydrologist (Medical): No    Lack of Transportation (Non-Medical): No  Physical Activity: Sufficiently Active (06/08/2020)   Exercise Vital Sign    Days of Exercise per Week: 7 days    Minutes of Exercise per Session: 30 min  Stress: No Stress Concern Present (06/08/2020)   [place]    Feeling of Stress : Not at all  Social Connections: Moderately Integrated (06/08/2020)   Social Connection and Isolation Panel [NHANES]    Frequency of Communication with Friends and Family: More than three times a week    Frequency of Social Gatherings with Friends and Family: Once a week    Attends Religious Services: Never    Marine scientist or Organizations: Yes    Attends Music therapist: More than 4 times per year    Marital Status: Married  Human resources officer Violence: Not At Risk (06/08/2020)   Humiliation, Afraid, Rape, and Kick questionnaire    Fear of Current or Ex-Partner: No    Emotionally Abused: No    Physically Abused: No    Sexually Abused: No    Family History:    Family History  Problem Relation Age of Onset   Hypertension Mother    Colon cancer Mother    Breast cancer Mother    Ovarian cancer Mother    Hypertension Father    Diabetes Father    Head & neck cancer Sister    CAD Neg Hx      ROS:   Please see the history of present illness.  Hip pain. All other ROS reviewed and negative.     Physical Exam/Data:   Vitals:   07/19/21 1358 07/19/21 2026 07/20/21 0319 07/20/21 0539  BP: (!) 118/49 (!) 125/45 (!) 117/49 (!) 137/56  Pulse: 63 60 100 70  Resp: '16 18 18 18  '$ Temp: 98.3 F (36.8 C) 98.1 F (36.7 C) 98.5 F (36.9 C) 98 F (36.7 C)  TempSrc: Oral Oral Oral Oral  SpO2: 100% 99% 97% 100%  Weight:      Height:        Intake/Output Summary (Last 24 hours) at 07/20/2021 0918 Last data filed at 07/19/2021 1704 Gross per 24 hour  Intake 240 ml  Output --  Net 240 ml      07/14/2021    9:18 PM 06/13/2021    1:09 PM 05/18/2021    3:56 PM  Last 3 Weights  Weight (lbs) 229 lb 15 oz 235 lb 235 lb  Weight (kg) 104.3 kg 106.595 kg 106.595 kg     Body mass index is 34.96 kg/m.   General:  Well nourished, well developed, in no acute distress HEENT: normal Neck: no JVD Vascular: No carotid bruits; Distal pulses 2+ bilaterally Cardiac: irregular; 1/6 systolic murmur Lungs:  clear to auscultation bilaterally, no wheezing, rhonchi or rales  Abd: soft, nontender, no hepatomegaly  Ext: no  edema; s/p hip surgery Musculoskeletal:  No deformities, BUE and BLE strength normal and equal Skin: warm and dry  Neuro:  CNs 2-12 intact, no focal abnormalities noted Psych:  Normal affect   EKG:  The EKG was personally reviewed and demonstrates:    EKG 07/20/21 at 9:03AM with A fib , ventricular rate of 95 bpm, old RBBB.   Telemetry:  Telemetry was personally reviewed and demonstrates: Atrial fibrillation with PVCs and intermittent pacing  Relevant CV Studies:  Echo from 02/11/21:   1. Left ventricular ejection fraction, by estimation, is 65 to 70%. The  left ventricle has normal function. The left ventricle has no regional  wall motion abnormalities. There is moderate asymmetric left ventricular  hypertrophy of the basal-septal  segment. Left ventricular diastolic parameters  are indeterminate.   2. Right ventricular systolic function is normal. The right ventricular  size is mildly enlarged. There is normal pulmonary artery systolic  pressure. The estimated right ventricular systolic pressure is 01.7 mmHg.   3. The mitral valve is normal in structure. Trivial mitral valve  regurgitation. No evidence of mitral stenosis.   4. The aortic valve is tricuspid. Aortic valve regurgitation is not  visualized. No aortic stenosis is present.   5. The inferior vena cava is normal in size with greater than 50%  respiratory variability, suggesting right atrial pressure of 3 mmHg.   Laboratory Data:  High Sensitivity Troponin:  No results for input(s): "TROPONINIHS" in the last 720 hours.   Chemistry Recent Labs  Lab 07/15/21 0418 07/16/21 0328 07/17/21 0329 07/19/21 0357  NA 143 140 140 140  K 3.7 3.9 3.7 4.1  CL 112* 108 109 110  CO2 '28 25 27 24  '$ GLUCOSE 108* 116* 112* 143*  BUN '17 18 21 16  '$ CREATININE 0.63 0.58 0.64 0.59  CALCIUM 10.2 10.3 10.1 9.3  MG 2.2  --   --   --   GFRNONAA >60 >60 >60 >60  ANIONGAP 3* 7 4* 6     Hematology Recent Labs  Lab 07/16/21 0328 07/17/21 0329 07/19/21 0357  WBC 6.5 6.3 9.0  RBC 4.13 4.05 3.86*  HGB 12.4 12.2 11.7*  HCT 39.4 37.5 36.0  MCV 95.4 92.6 93.3  MCH 30.0 30.1 30.3  MCHC 31.5 32.5 32.5  RDW 14.7 14.6 14.0  PLT 173 179 173      Radiology/Studies:  DG Pelvis Portable  Result Date: 07/18/2021 CLINICAL DATA:  Left hip replacement EXAM: PORTABLE PELVIS 1-2 VIEWS COMPARISON:  03/09/2020 FINDINGS: Total hip arthroplasty on the left. Components appear well positioned. No radiographically detectable complication. IMPRESSION: Good appearance following total hip arthroplasty on the left. Electronically Signed   By: Nelson Chimes M.D.   On: 07/18/2021 19:45   DG HIP PORT UNILAT WITH PELVIS 1V LEFT  Result Date: 07/18/2021 CLINICAL DATA:  Total hip arthroplasty EXAM: DG HIP (WITH OR WITHOUT PELVIS) 1V PORT LEFT  COMPARISON:  07/14/2021 FINDINGS: Two C-arm images show total arthroplasty on the left. Components appear well position. No radiographically detectable complication. IMPRESSION: Good appearance following total hip arthroplasty on the left. Electronically Signed   By: Nelson Chimes M.D.   On: 07/18/2021 18:54   DG C-Arm 1-60 Min-No Report  Result Date: 07/18/2021 Fluoroscopy was utilized by the requesting physician.  No radiographic interpretation.     Assessment and Plan:   Persistent A fib with RVR - long history dating back to 2015, s/p ablation 2019, on Cardizem CD '120mg'$  daily and flecainide '100mg'$  BID  PTA per [name]  - A fib RVR 07/20/21 0000, 120s per RRT reports, was given additional diltiazem '30mg'$  and flecainide '100mg'$  by ICU team  -    Tachybradycardia syndrome  Junctional rhythm with bradycardia  - s/p PPM 2022    Risk Assessment/Risk Scores:   CHA2DS2-VASc Score = 3   This indicates a 3.2% annual risk of stroke. The patient's score is based upon: CHF History: 0 HTN History: 1 Diabetes History: 0 Stroke History: 0 Vascular Disease History: 0 Age Score: 1 Gender Score: 1   For questions or updates, please contact [place] Please consult [email or web address] for contact info under   Signed, Margie Billet, NP  07/20/2021 9:18 AM As above, patient seen and examined.  Briefly she is a 76 year old female with past medical history of paroxysmal atrial fibrillation, tachybradycardia syndrome requiring pacemaker placement, hypertension, hyperlipidemia, obstructive sleep apnea, irritable bowel, history of endometrial cancer and status post recent repair of hip fracture for evaluation of atrial fibrillation with rapid ventricular response.  Last echocardiogram February 2023 showed normal LV function, moderate asymmetric septal hypertrophy.  At previous office visit with Dr. Caryl Comes patient's flecainide was increased for increasing frequency of atrial fibrillation.  She had significant  improvement without intervention.  Patient had a recent fall and on July 17 underwent left total hip arthroplasty due to displaced left femoral neck fracture.  Last evening she developed recurrent atrial fibrillation with rapid ventricular response.  Cardiology now asked to evaluate.  Note she does have palpitations but denies dyspnea, chest pain and has not had syncope. Electrocardiogram shows atrial fibrillation, left axis deviation, right bundle branch block, left ventricular hypertrophy, prior septal infarct cannot be excluded.  Creatinine 0.59 and hemoglobin 11.7. 1 paroxysmal atrial fibrillation-patient has developed atrial fibrillation and this is likely postoperative due to recent repair of femoral neck fracture.  She had previously been doing well on flecainide 100 mg twice daily and Cardizem CD.  We will plan to continue flecainide.  Increase Cardizem CD to 180 mg daily.  Would increase Xarelto to 20 mg daily when okay with orthopedics.  If she does not convert would ultimately need cardioversion.  If she has more frequent episodes of atrial fibrillation after she recovers from surgery then could consider discontinuing flecainide and treating with Tikosyn or referring for repeat ablation.  Recent echocardiogram showed preserved LV function.  2 hypertension-we will hold Maxide as we are increasing Cardizem.  Follow blood pressure and adjust regimen as needed.  3 status post pacemaker  4 status post repair of hip fracture-Per orthopedics.  5 history of lower extremity edema-resume Lasix at discharge.  Kirk Ruths, MD

## 2021-07-20 NOTE — Telephone Encounter (Signed)
Spouse states that pt in currently in the hospital at [facility] ( Rm 1341) due to having a broken hip. Spouse states that pt went into Afib last night and would like for Dr. Caryl Comes or nurse to call as soon as possible being that she is a pt of his. Please advise

## 2021-07-21 DIAGNOSIS — S72002A Fracture of unspecified part of neck of left femur, initial encounter for closed fracture: Secondary | ICD-10-CM | POA: Diagnosis not present

## 2021-07-21 LAB — BASIC METABOLIC PANEL
Anion gap: 7 (ref 5–15)
BUN: 11 mg/dL (ref 8–23)
CO2: 22 mmol/L (ref 22–32)
Calcium: 9.5 mg/dL (ref 8.9–10.3)
Chloride: 107 mmol/L (ref 98–111)
Creatinine, Ser: 0.59 mg/dL (ref 0.44–1.00)
GFR, Estimated: >60 mL/min (ref >60)
Glucose, Bld: 108 mg/dL — ABNORMAL HIGH (ref 70–99)
Potassium: 4 mmol/L (ref 3.5–5.1)
Sodium: 136 mmol/L (ref 135–145)

## 2021-07-21 LAB — CBC
HCT: 29.6 % — ABNORMAL LOW (ref 36.0–46.0)
Hemoglobin: 9.6 g/dL — ABNORMAL LOW (ref 12.0–15.0)
MCH: 30.2 pg (ref 26.0–34.0)
MCHC: 32.4 g/dL (ref 30.0–36.0)
MCV: 93.1 fL (ref 80.0–100.0)
Platelets: 146 10*3/uL — ABNORMAL LOW (ref 150–400)
RBC: 3.18 MIL/uL — ABNORMAL LOW (ref 3.87–5.11)
RDW: 14.6 % (ref 11.5–15.5)
WBC: 7.5 10*3/uL (ref 4.0–10.5)
nRBC: 0 % (ref 0.0–0.2)

## 2021-07-21 LAB — MAGNESIUM: Magnesium: 2 mg/dL (ref 1.7–2.4)

## 2021-07-21 MED ORDER — DILTIAZEM HCL ER COATED BEADS 180 MG PO CP24: 180.0000 mg | ORAL_CAPSULE | Freq: Every day | ORAL | 1 refills | Status: DC

## 2021-07-21 MED ORDER — HYDROCODONE-ACETAMINOPHEN 10-325 MG PO TABS
0.5000 | ORAL_TABLET | ORAL | 0 refills | Status: AC | PRN
Start: 2021-07-21 — End: 2021-07-28

## 2021-07-21 MED ORDER — METHOCARBAMOL 500 MG PO TABS: 500.0000 mg | ORAL_TABLET | Freq: Four times a day (QID) | ORAL | 0 refills | Status: DC | PRN

## 2021-07-21 MED ORDER — TRIAMTERENE-HCTZ 37.5-25 MG PO TABS: 0.5000 | ORAL_TABLET | Freq: Every day | ORAL | Status: DC

## 2021-07-21 NOTE — Plan of Care (Signed)
Patient discharging home via private vehicle with husband. Ivan Anchors, RN 07/21/21 12:48 PM

## 2021-07-21 NOTE — Progress Notes (Signed)
Progress Note  Patient Name: Veronica Bradford Date of Encounter: 07/21/2021  [facility] Cardiologist: Virl Axe, MD   Subjective   No CP or dyspnea  Inpatient Medications    Scheduled Meds:  Chlorhexidine Gluconate Cloth  6 each Topical Q0600   Chlorhexidine Gluconate Cloth  6 each Topical Daily   diltiazem  180 mg Oral Daily   docusate sodium  100 mg Oral BID   flecainide  100 mg Oral BID   mupirocin ointment  1 Application Nasal BID   mupirocin ointment  1 Application Nasal BID   pantoprazole  40 mg Oral Daily   pravastatin  40 mg Oral q1800   Ensure Max Protein  11 oz Oral Daily   rivaroxaban  10 mg Oral Q supper   senna  1 tablet Oral BID   Continuous Infusions:  methocarbamol (ROBAXIN) IV     PRN Meds: alum & mag hydroxide-simeth, diphenhydrAMINE, HYDROcodone-acetaminophen, menthol-cetylpyridinium **OR** phenol, methocarbamol **OR** methocarbamol (ROBAXIN) IV, metoCLOPramide **OR** metoCLOPramide (REGLAN) injection, morphine injection, ondansetron **OR** ondansetron (ZOFRAN) IV, polyethylene glycol   Vital Signs    Vitals:   07/20/21 2047 07/21/21 0131 07/21/21 0446 07/21/21 0904  BP: (!) 152/50 (!) 136/48 (!) 144/59 134/60  Pulse: (!) 59 63 64 63  Resp: '16 18 14 18  '$ Temp: 99 F (37.2 C) 98.3 F (36.8 C) 98.9 F (37.2 C) 98.1 F (36.7 C)  TempSrc: Oral Oral Oral Oral  SpO2: 98% 97% 100% 100%  Weight:      Height:        Intake/Output Summary (Last 24 hours) at 07/21/2021 0937 Last data filed at 07/21/2021 0854 Gross per 24 hour  Intake 958 ml  Output --  Net 958 ml      07/14/2021    9:18 PM 06/13/2021    1:09 PM 05/18/2021    3:56 PM  Last 3 Weights  Weight (lbs) 229 lb 15 oz 235 lb 235 lb  Weight (kg) 104.3 kg 106.595 kg 106.595 kg      Telemetry    Atrial paced - Personally Reviewed   Physical Exam   GEN: No acute distress.   Neck: No JVD Cardiac: RRR, no murmurs, rubs, or gallops.  Respiratory: Clear to auscultation  bilaterally. GI: Soft, nontender, non-distended  MS: No edema; s/p hip surgery Neuro:  Nonfocal  Psych: Normal affect   Labs     Chemistry Recent Labs  Lab 07/15/21 0418 07/16/21 0328 07/17/21 0329 07/19/21 0357 07/21/21 0408  NA 143   < > 140 140 136  K 3.7   < > 3.7 4.1 4.0  CL 112*   < > 109 110 107  CO2 28   < > '27 24 22  '$ GLUCOSE 108*   < > 112* 143* 108*  BUN 17   < > '21 16 11  '$ CREATININE 0.63   < > 0.64 0.59 0.59  CALCIUM 10.2   < > 10.1 9.3 9.5  MG 2.2  --   --   --  2.0  GFRNONAA >60   < > >60 >60 >60  ANIONGAP 3*   < > 4* 6 7   < > = values in this interval not displayed.     Hematology Recent Labs  Lab 07/17/21 0329 07/19/21 0357 07/21/21 0408  WBC 6.3 9.0 7.5  RBC 4.05 3.86* 3.18*  HGB 12.2 11.7* 9.6*  HCT 37.5 36.0 29.6*  MCV 92.6 93.3 93.1  MCH 30.1 30.3 30.2  MCHC 32.5  32.5 32.4  RDW 14.6 14.0 14.6  PLT 179 173 146*    Patient Profile     76 year old female with past medical history of paroxysmal atrial fibrillation, tachybradycardia syndrome requiring pacemaker placement, hypertension, hyperlipidemia, obstructive sleep apnea, irritable bowel, history of endometrial cancer and status post recent repair of hip fracture for evaluation of atrial fibrillation with rapid ventricular response.  Last echocardiogram February 2023 showed normal LV function, moderate asymmetric septal hypertrophy.  At previous office visit with Dr. Caryl Comes patient's flecainide was increased for increasing frequency of atrial fibrillation.  She had significant improvement without intervention.  Patient had a recent fall and on July 17 underwent left total hip arthroplasty due to displaced left femoral neck fracture.  She developed recurrent atrial fibrillation with rapid ventricular response.  Cardiology asked to evaluate.    Assessment & Plan    1 paroxysmal atrial fibrillation-patient has converted.  She is now in an atrial paced rhythm.  Continue Cardizem CD at 180 mg daily.   Continue flecainide at 100 mg twice daily.  Hemoglobin has decreased today.  Xarelto can be held from a cardiac standpoint if necessary.  Begin 20 mg daily when okay with orthopedic surgery.  Note recent echocardiogram showed normal LV function.  As noted previously this is likely postoperative atrial fibrillation.  She has been doing well on present dose of flecainide.  If she has more frequent episodes in the future can consider a different antiarrhythmic such as Tikosyn or possible ablation.   2 hypertension-given that we have increased Cardizem to 180 mg daily would discontinue Maxide at discharge.  Blood pressure will be followed and Maxide can be resumed if necessary.   3 status post pacemaker   4 status post repair of hip fracture-Per orthopedics.   5 history of lower extremity edema-resume Lasix at preadmission dose at discharge.  Cardiology will sign off.  We will arrange follow-up with APP in 2 to 4 weeks.  Please call with questions.  For questions or updates, please contact [place] Please consult [email or web address] for contact info under        Signed, Kirk Ruths, MD  07/21/2021, 9:37 AM

## 2021-07-21 NOTE — Progress Notes (Signed)
Physical Therapy Treatment Patient Details Name: Veronica Bradford MRN: [id number] DOB: Sep 28, 1945 Today's Date: 07/21/2021   History of Present Illness 76 y.o. female who presented to the emergency department with severe left hip pain after a fall and underwent Left total hip arthroplasty, anterior approach on 07/18/21 with medical history significant for hypertension, atrial fibrillation on Xarelto, tachybradycardia syndrome with pacer, and BMI 35.    PT Comments    Pt in good spirits and eager for dc home but requests to review stairs and shower transfers.  Pt up to ambulate limited distance, negotiated stairs and negotiated step in shower entry with min difficulty.   Recommendations for follow up therapy are one component of a multi-disciplinary discharge planning process, led by the attending physician.  Recommendations may be updated based on patient status, additional functional criteria and insurance authorization.  Follow Up Recommendations  Home health PT     Assistance Recommended at Discharge Intermittent Supervision/Assistance  Patient can return home with the following Assistance with cooking/housework;Assist for transportation;Help with stairs or ramp for entrance   Equipment Recommendations  None recommended by PT    Recommendations for Other Services       Precautions / Restrictions Precautions Precautions: Fall Precaution Comments: monitor HR Restrictions Weight Bearing Restrictions: No LLE Weight Bearing: Weight bearing as tolerated Other Position/Activity Restrictions: WBAT     Mobility  Bed Mobility               General bed mobility comments: Pt up in chair and requests back to same    Transfers Overall transfer level: Needs assistance Equipment used: Rolling walker (2 wheels) Transfers: Sit to/from Stand Sit to Stand: Supervision           General transfer comment: verbal cues for hand placement. pt self cues end of session     Ambulation/Gait Ambulation/Gait assistance: Supervision Gait Distance (Feet): 58 Feet Assistive device: Rolling walker (2 wheels) Gait Pattern/deviations: Step-to pattern, Decreased stance time - left Gait velocity: decr     General Gait Details: pt self cues for sequence   Stairs Stairs: Yes Stairs assistance: Min guard, Min assist Stair Management: Step to pattern, Forwards, With walker Number of Stairs: 2 General stair comments: min cues for sequence   Wheelchair Mobility    Modified Rankin (Stroke Patients Only)       Balance Overall balance assessment: Needs assistance, History of Falls Sitting-balance support: Feet supported, No upper extremity supported Sitting balance-Leahy Scale: Good     Standing balance support: No upper extremity supported Standing balance-Leahy Scale: Fair                              Cognition Arousal/Alertness: Awake/alert Behavior During Therapy: WFL for tasks assessed/performed Overall Cognitive Status: Within Functional Limits for tasks assessed                                          Exercises      General Comments        Pertinent Vitals/Pain Pain Assessment Pain Assessment: 0-10 Pain Score: 5  Pain Location: L hip Pain Descriptors / Indicators: Burning, Sore Pain Intervention(s): Limited activity within patient's tolerance, Monitored during session, RN gave pain meds during session, Ice applied    Home Living  Prior Function            PT Goals (current goals can now be found in the care plan section) Acute Rehab PT Goals Patient Stated Goal: home soon PT Goal Formulation: With patient Time For Goal Achievement: 07/26/21 Potential to Achieve Goals: Good Progress towards PT goals: Progressing toward goals    Frequency    7X/week      PT Plan Current plan remains appropriate    Co-evaluation              AM-PAC PT "6  Clicks" Mobility   Outcome Measure  Help needed turning from your back to your side while in a flat bed without using bedrails?: A Little Help needed moving from lying on your back to sitting on the side of a flat bed without using bedrails?: A Little Help needed moving to and from a bed to a chair (including a wheelchair)?: A Little Help needed standing up from a chair using your arms (e.g., wheelchair or bedside chair)?: A Little Help needed to walk in hospital room?: A Little Help needed climbing 3-5 steps with a railing? : A Little 6 Click Score: 18    End of Session Equipment Utilized During Treatment: Gait belt Activity Tolerance: Patient tolerated treatment well Patient left: in chair;with call bell/phone within reach Nurse Communication: Mobility status PT Visit Diagnosis: Other abnormalities of gait and mobility (R26.89);Difficulty in walking, not elsewhere classified (R26.2)     Time: 9371-6967 PT Time Calculation (min) (ACUTE ONLY): 22 min  Charges:  $Gait Training: 8-22 mins                     [place] Pager [phone] Office [phone]    Veronica Bradford 07/21/2021, 12:08 PM

## 2021-07-21 NOTE — Progress Notes (Signed)
Follow up with cardiology on 08/15/21 arranged

## 2021-07-21 NOTE — Discharge Summary (Signed)
Discharge Summary  Veronica Bradford KNL:[id number] DOB: [birth year]  PCP: Colon Branch, MD  Admit date: 07/14/2021 Discharge date: 07/21/2021  Time spent: 73mns, more than 50% time spent on coordination of care.   Recommendations for Outpatient Follow-up:  F/u with PCP within a week  for hospital discharge follow up, repeat cbc/bmp at follow up, pcp to monitor anemia F/u with cardiology for afib management F/u with ortho for post op follow up  Discharge Diagnoses:  Active Hospital Problems   Diagnosis Date Noted   [place] left [place] fracture, [place] encounter ([facility] 07/14/2021   Chronic diastolic CHF (congestive heart failure) ([facility] 07/14/2021   Paroxysmal atrial fibrillation ([facility] 09/04/2017   Sleep apnea-- Dr OMaxwell Caul on CPAP 09/09/2013    Resolved Hospital Problems  No resolved problems to display.    Discharge Condition: stable  Diet recommendation: heart healthy  Filed Weights   07/14/21 2118  Weight: 104.3 kg    History of present illness: ( per admitting MD Opyd) Chief Complaint: Left hip pain after a fall    HPI: Veronica PERRIERis a 76y.o. female with medical history significant for hypertension, atrial fibrillation on Xarelto, tachybradycardia syndrome with pacer, and BMI 35, who presented to the emergency department with severe left hip pain after a fall.  Patient reports that she was in her usual state of health and having an uneventful day when she tripped and fell directly onto her left hip.  She experienced severe pain when she tried to bear weight and suspected she had suffered a fracture.  Patient reports that she walks 1.5 to 2 miles daily and never experiences shortness of breath or chest pain with that.  She had a nuclear stress test in August 2021 that was a low risk, normal study.   [facility]: Upon arrival to the ED, patient is found to be afebrile and saturating well on room air with stable blood pressure.  EKG features sinus rhythm with first-degree AV  nodal block, RBBB, LAFB, and LVH with repolarization abnormality.  Chest x-ray negative for acute cardiopulmonary disease.  Plain films of the hip and pelvis demonstrate impacted subcapital left femoral neck fracture with minimal displacement.  Orthopedic surgery was consulted by the ED physician [facility]  Principal Problem:   Closed left hip fracture, initial encounter ([facility] Active Problems:   Sleep apnea-- Dr OMaxwell Caul on CPAP   [place]mal [place] fibrillation ([facility]   Chronic diastolic CH[place] (congestive heart failure) ([facility]   Assessment and Plan:  Left hip fracture, s/p mechanical fall  S/p  Left total hip arthroplasty on 07/18/2021. Pain control, wound care, weight bearing status per ortho Ortho recommended reduced does xeralto until 90/20, can resume home does xeralto from 7/21 -PT/OT eval recommending home health   PAF /SSS s/p pace maker placement -In A-fib RVR on 7/19 a.m., seen by  cardiology consult, cardizem dose increased, she is back to NSR, she is cleared to discharge by cardiology Continue flecainide and Cardizem -resume xarleto -she is to follow up with cardiology    Chronic HFpEF  Compensated.  Preserved EF on echo in February 2023.   Hypokalemia  -repleted   OSA  - Continue CPAP qHS     Obesity Body mass index is 34.96 kg/m.   Discharge Exam: BP 134/60 (BP Location: Right Arm)   Pulse 63   Temp 98.1 F (36.7 C) (Oral)   Resp 18   Ht '5\' 8"'$  (1.727  m)   Wt 104.3 kg   SpO2 100%   BMI 34.96 kg/m   General: NAD Cardiovascular: RRR Respiratory: normal respiratory effort     Discharge Instructions     Diet - low sodium heart healthy   Complete by: As directed    Diet general   Complete by: As directed    Discharge instructions   Complete by: As directed    take '10mg'$  of Xarelto for another 2 days (7/19 and 7/20) before going back to the '20mg'$  at baseline   Discharge wound care:    Complete by: As directed    Wound care per ortho   Increase activity slowly   Complete by: As directed    Increase activity slowly   Complete by: As directed    No wound care   Complete by: As directed       Allergies as of 07/21/2021       Reactions   Ibuprofen Other (See Comments)   Pt is on Xarelto   Lactose Intolerance (gi) Diarrhea, Nausea And Vomiting   Lisinopril Cough   Nsaids Other (See Comments)   Pt on Xarelto   Tape    Causes blisters   Tolmetin Other (See Comments)   Pt on Xarelto   Zoster Vaccine Live Other (See Comments)   Serum sickness, high fever, neck pain    Amoxicillin Rash   Penicillins Rash   Local reaction        Medication List     STOP taking these medications    acetaminophen-codeine 300-30 MG tablet Commonly known as: TYLENOL #3       TAKE these medications    acetaminophen 500 MG tablet Commonly known as: TYLENOL Take 500 mg by mouth every 6 (six) hours as needed for moderate pain or headache.   ARTIFICIAL TEAR SOLUTION OP Place 1 drop into both eyes 4 (four) times daily.   Boostrix 5-2.5-18.5 LF-MCG/0.5 injection Generic drug: Tdap Inject into the muscle.   cholecalciferol 25 MCG (1000 UNIT) tablet Commonly known as: VITAMIN D3 Take 1,000 Units by mouth daily.   CRANBERRY EXTRACT PO Take 2 tablets by mouth daily.   diltiazem 180 MG 24 hr capsule Commonly known as: CARDIZEM CD Take 1 capsule (180 mg total) by mouth daily. What changed:  medication strength how much to take how to take this when to take this additional instructions   diltiazem 30 MG tablet Commonly known as: Cardizem Take 1 tablet every 4 hours AS NEEDED for heart rate >100 as long as blood pressure >100.   FISH OIL PO Take 1 tablet by mouth daily.   flecainide 100 MG tablet Commonly known as: TAMBOCOR Take 1 tablet (100 mg total) by mouth 2 (two) times daily.   fluticasone 50 MCG/ACT nasal spray Commonly known as: FLONASE Place 1 spray  into both nostrils every other day.   FOLIC ACID PO Take 1 tablet by mouth daily.   furosemide 40 MG tablet Commonly known as: LASIX Take 1 tablet (40 mg total) by mouth daily.   HYDROcodone-acetaminophen 10-325 MG tablet Commonly known as: Norco Take 0.5 tablets by mouth every 4 (four) hours as needed for up to 7 days for severe pain or moderate pain.   IRON PO Take 1 tablet by mouth daily.   methocarbamol 500 MG tablet Commonly known as: ROBAXIN Take 1 tablet (500 mg total) by mouth every 6 (six) hours as needed for muscle spasms.   pantoprazole 40 MG tablet Commonly known  as: PROTONIX Take 1 tablet (40 mg total) by mouth 2 (two) times daily. What changed: when to take this   pravastatin 40 MG tablet Commonly known as: PRAVACHOL TAKE 1 TABLET(40 MG) BY MOUTH AT BEDTIME What changed: See the new instructions.   rivaroxaban 20 MG Tabs tablet Commonly known as: Xarelto Take 1 tablet (20 mg total) by mouth daily with supper. Start taking on: July 22, 2021   triamterene-hydrochlorothiazide 37.5-25 MG tablet Commonly known as: MAXZIDE-25 Take 0.5 tablets by mouth daily. Please hold this medication, please discuss with your pcp and cardiology about resuming this meds What changed: additional instructions   VITAMIN B 12 PO Take 1 tablet by mouth daily.               Discharge Care Instructions  (From admission, onward)           Start     Ordered   07/21/21 0000  Discharge wound care:       Comments: Wound care per ortho   07/21/21 2878           Allergies  Allergen Reactions   Ibuprofen Other (See Comments)    Pt is on Xarelto   Lactose Intolerance (Gi) Diarrhea and Nausea And Vomiting   Lisinopril Cough   Nsaids Other (See Comments)    Pt on Xarelto   Tape     Causes blisters    Tolmetin Other (See Comments)    Pt on Xarelto   Zoster Vaccine Live Other (See Comments)    Serum sickness, high fever, neck pain    Amoxicillin Rash    Penicillins Rash    Local reaction    Follow-up Information     Swinteck, Aaron Edelman, MD. Schedule an appointment as soon as possible for a visit in 2 week(s).   Specialty: Orthopedic Surgery Why: For wound re-check, For suture removal Contact information: [address] [place] [address] [phone]         [facility], [facility] Follow up.   Specialty: Home Health Services Why: Alvis Lemmings will provide PT in the home after discharge. Contact information: [address] [place] [address] [phone]         Colon Branch, MD Follow up.   Specialty: Internal Medicine Why: hospital discharge follow up, repeat cbc/bmp at follow up, pcp to follow up on anemia Contact information: [address] [place]  [address] [phone]         [name], MD .   Specialty: Cardiology Contact information: [address] [place] [address] [phone]                  The results of significant diagnostics from this hospitalization (including imaging, microbiology, ancillary and laboratory) are listed below for reference.    Significant Diagnostic Studies: DG Pelvis Portable  Result Date: 07/18/2021 CLINICAL DATA:  Left hip replacement EXAM: PORTABLE PELVIS 1-2 VIEWS COMPARISON:  03/09/2020 FINDINGS: Total hip arthroplasty on the left. Components appear well positioned. No radiographically detectable complication. IMPRESSION: Good appearance following total hip arthroplasty on the left. Electronically Signed   By: Nelson Chimes M.D.   On: 07/18/2021 19:45   DG HIP PORT UNILAT WITH PELVIS 1V LEFT  Result Date: 07/18/2021 CLINICAL DATA:  Total hip arthroplasty EXAM: DG HIP (WITH OR WITHOUT PELVIS) 1V PORT LEFT COMPARISON:  07/14/2021 FINDINGS: Two C-arm images show total arthroplasty on the left. Components appear well  position. No radiographically detectable complication. IMPRESSION: Good appearance  following total hip arthroplasty on the left. Electronically Signed   By: Nelson Chimes M.D.   On: 07/18/2021 18:54   DG C-Arm 1-60 Min-No Report  Result Date: 07/18/2021 Fluoroscopy was utilized by the requesting physician.  No radiographic interpretation.   DG Knee Complete 4 Views Left  Result Date: 07/14/2021 CLINICAL DATA:  Left femoral neck fracture after a fall EXAM: LEFT KNEE - COMPLETE 4+ VIEW COMPARISON:  Hip radiographs of same date dictated separately. MRI of 04/10/2007. FINDINGS: Marked patellofemoral and moderate medial/lateral compartment joint space narrowing with osteophyte formation. Chondrocalcinosis within the menisci. Small suprapatellar joint effusion. No acute fracture or dislocation. 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 Electronically Signed   By: Abigail Miyamoto M.D.   On: 07/14/2021 15:57   DG Chest Port 1 View  Result Date: 07/14/2021 CLINICAL DATA:  Preop. EXAM: PORTABLE CHEST 1 VIEW COMPARISON:  09/13/2020. FINDINGS: Cardiomediastinal silhouette is similar when accounting for differences in technique. Left subclavian approach cardiac rhythm maintenance device. No consolidation. No visible pleural effusions or pneumothorax. No acute osseous abnormality. IMPRESSION: No evidence of acute cardiopulmonary disease. Electronically Signed   By: Margaretha Sheffield M.D.   On: 07/14/2021 15:35   DG Hip Unilat W or Wo Pelvis 2-3 Views Left  Result Date: 07/14/2021 CLINICAL DATA:  Injury, fall EXAM: DG HIP (WITH OR WITHOUT PELVIS) 2-3V LEFT COMPARISON:  CT 03/09/2020 FINDINGS: There is an impacted subcapital left femoral neck fracture with minimal displacement. Lower lumbar spine degenerative changes. IMPRESSION: Impacted subcapital left femoral neck fracture with minimal displacement. Electronically Signed   By: Maurine Simmering M.D.   On: 07/14/2021  14:50    Microbiology: Recent Results (from the past 240 hour(s))  Surgical pcr screen     Status: Abnormal   Collection Time: 07/16/21  6:22 AM   Specimen: Nasal Mucosa; Nasal Swab  Result Value Ref Range Status   MRSA, PCR NEGATIVE NEGATIVE Final   Staphylococcus aureus POSITIVE (A) NEGATIVE Final    Comment: (NOTE) The Xpert SA Assay (FDA approved for NASAL specimens in patients 47 years of age and older), is one component of a comprehensive surveillance program. It is not intended to diagnose infection nor to guide or monitor treatment. Performed at [facility], [place] [address] [place], [place] [address]      Labs: Basic Metabolic Panel: Recent Labs  Lab 07/15/21 0418 07/16/21 0328 07/17/21 0329 07/19/21 0357 07/21/21 0408  NA 143 140 140 140 136  K 3.7 3.9 3.7 4.1 4.0  CL 112* 108 109 110 107  CO2 '28 25 27 24 22  '$ GLUCOSE 108* 116* 112* 143* 108*  BUN '17 18 21 16 11  '$ CREATININE 0.63 0.58 0.64 0.59 0.59  CALCIUM 10.2 10.3 10.1 9.3 9.5  MG 2.2  --   --   --  2.0   Liver Function Tests: No results for input(s): "AST", "ALT", "ALKPHOS", "BILITOT", "PROT", "ALBUMIN" in the last 168 hours. No results for input(s): "LIPASE", "AMYLASE" in the last 168 hours. No results for input(s): "AMMONIA" in the last 168 hours. CBC: Recent Labs  Lab 07/15/21 0418 07/16/21 0328 07/17/21 0329 07/19/21 0357 07/21/21 0408  WBC 6.7 6.5 6.3 9.0 7.5  HGB 12.3 12.4 12.2 11.7* 9.6*  HCT 37.5 39.4 37.5 36.0 29.6*  MCV 92.1 95.4 92.6 93.3 93.1  PLT 193 173 179 173 146*  Cardiac Enzymes: No results for input(s): "CKTOTAL", "CKMB", "CKMBINDEX", "TROPONINI" in the last 168 hours. BNP: BNP (last 3 results) No results for input(s): "BNP" in the last 8760 hours.  ProBNP (last 3 results) No results for input(s): "PROBNP" in the last 8760 hours.  CBG: No results for input(s): "GLUCAP" in the last 168 hours.  FURTHER DISCHARGE INSTRUCTIONS:   Get Medicines  reviewed and adjusted: Please take all your medications with you for your next visit with your Primary MD   Laboratory/radiological data: Please request your Primary MD to go over all hospital tests and procedure/radiological results at the follow up, please ask your Primary MD to get all Hospital records sent to his/her office.   In some cases, they will be blood work, cultures and biopsy results pending at the time of your discharge. Please request that your primary care M.D. goes through all the records of your hospital data and follows up on these results.   Also Note the following: If you experience worsening of your admission symptoms, develop shortness of breath, life threatening emergency, suicidal or homicidal thoughts you must seek medical attention immediately by calling 911 or calling your MD immediately  if symptoms less severe.   You must read complete instructions/literature along with all the possible adverse reactions/side effects for all the Medicines you take and that have been prescribed to you. Take any new Medicines after you have completely understood and accpet all the possible adverse reactions/side effects.    Do not drive when taking Pain medications or sleeping medications (Benzodaizepines)   Do not take more than prescribed Pain, Sleep and Anxiety Medications. It is not advisable to combine anxiety,sleep and pain medications without talking with your primary care practitioner   Special Instructions: If you have smoked or chewed Tobacco  in the last 2 yrs please stop smoking, stop any regular Alcohol  and or any Recreational drug use.   Wear Seat belts while driving.   Please note: You were cared for by a hospitalist during your hospital stay. Once you are discharged, your primary care physician will handle any further medical issues. Please note that NO REFILLS for any discharge medications will be authorized once you are discharged, as it is imperative that you  return to your primary care physician (or establish a relationship with a primary care physician if you do not have one) for your post hospital discharge needs so that they can reassess your need for medications and monitor your lab values.     Signed:  Florencia Reasons MD, PhD, FACP  [facility] 07/21/2021, 7:12 PM

## 2021-07-22 ENCOUNTER — Other Ambulatory Visit: Payer: Self-pay

## 2021-07-22 DIAGNOSIS — I480 Paroxysmal atrial fibrillation: Secondary | ICD-10-CM | POA: Diagnosis not present

## 2021-07-22 DIAGNOSIS — I5032 Chronic diastolic (congestive) heart failure: Secondary | ICD-10-CM | POA: Diagnosis not present

## 2021-07-22 DIAGNOSIS — I110 Hypertensive heart disease with heart failure: Secondary | ICD-10-CM | POA: Diagnosis not present

## 2021-07-22 DIAGNOSIS — Z96642 Presence of left artificial hip joint: Secondary | ICD-10-CM | POA: Diagnosis not present

## 2021-07-22 DIAGNOSIS — S72012D Unspecified intracapsular fracture of left femur, subsequent encounter for closed fracture with routine healing: Secondary | ICD-10-CM | POA: Diagnosis not present

## 2021-07-24 ENCOUNTER — Telehealth: Payer: Self-pay | Admitting: Physician Assistant

## 2021-07-24 DIAGNOSIS — G4733 Obstructive sleep apnea (adult) (pediatric): Secondary | ICD-10-CM | POA: Diagnosis not present

## 2021-07-24 NOTE — Telephone Encounter (Signed)
Patient paged after hours answering service with complaint of increased weight and bilateral lower extremity edema.  Her weight has gained 13 pounds after recent hip replacement surgery.  She does not know her dry weight after surgery.  Hospital side did not document any weight after surgery either.  She clearly has significant swelling on the side that is operated on, however also has significant swelling on the side of the leg that was not operated on.  I think the patient is truly volume overloaded.  I recommended increase Lasix to 40 mg twice a day for 3 days before going back to the previous dosing.  Triage team, please check with Dr. Caryl Comes to see if the patient still need to see Richardson Dopp on 8/14, it did not make a whole lot of sense for the patient to see Richardson Dopp on August 14 when she already have appointment with Dr. Caryl Comes on August 15.

## 2021-07-25 ENCOUNTER — Inpatient Hospital Stay: Payer: Medicare Other | Admitting: Internal Medicine

## 2021-07-25 DIAGNOSIS — Z96642 Presence of left artificial hip joint: Secondary | ICD-10-CM | POA: Diagnosis not present

## 2021-07-25 DIAGNOSIS — I110 Hypertensive heart disease with heart failure: Secondary | ICD-10-CM | POA: Diagnosis not present

## 2021-07-25 DIAGNOSIS — I480 Paroxysmal atrial fibrillation: Secondary | ICD-10-CM | POA: Diagnosis not present

## 2021-07-25 DIAGNOSIS — S72012D Unspecified intracapsular fracture of left femur, subsequent encounter for closed fracture with routine healing: Secondary | ICD-10-CM | POA: Diagnosis not present

## 2021-07-25 DIAGNOSIS — I5032 Chronic diastolic (congestive) heart failure: Secondary | ICD-10-CM | POA: Diagnosis not present

## 2021-07-26 ENCOUNTER — Ambulatory Visit: Payer: Medicare Other | Admitting: Internal Medicine

## 2021-07-27 ENCOUNTER — Other Ambulatory Visit: Payer: Self-pay

## 2021-07-27 ENCOUNTER — Ambulatory Visit (INDEPENDENT_AMBULATORY_CARE_PROVIDER_SITE_OTHER): Payer: Medicare Other | Admitting: Medical

## 2021-07-27 ENCOUNTER — Encounter (HOSPITAL_COMMUNITY): Payer: Self-pay | Admitting: Emergency Medicine

## 2021-07-27 ENCOUNTER — Emergency Department (HOSPITAL_COMMUNITY)
Admission: EM | Admit: 2021-07-27 | Discharge: 2021-07-27 | Disposition: A | Discharge status: Home / Self Care | Payer: Medicare Other | Attending: Emergency Medicine | Admitting: Emergency Medicine

## 2021-07-27 ENCOUNTER — Ambulatory Visit (HOSPITAL_BASED_OUTPATIENT_CLINIC_OR_DEPARTMENT_OTHER)
Admission: RE | Admit: 2021-07-27 | Discharge: 2021-07-27 | Disposition: A | Discharge status: Home / Self Care | Payer: Medicare Other | Source: Ambulatory Visit | Attending: Medical | Admitting: Medical

## 2021-07-27 VITALS — HR 68 | Temp 98.6°F | Resp 18 | Ht 68.0 in | Wt 235.6 lb

## 2021-07-27 DIAGNOSIS — R635 Abnormal weight gain: Secondary | ICD-10-CM

## 2021-07-27 DIAGNOSIS — D649 Anemia, unspecified: Secondary | ICD-10-CM | POA: Diagnosis not present

## 2021-07-27 DIAGNOSIS — I480 Paroxysmal atrial fibrillation: Secondary | ICD-10-CM | POA: Insufficient documentation

## 2021-07-27 DIAGNOSIS — I5189 Other ill-defined heart diseases: Secondary | ICD-10-CM | POA: Insufficient documentation

## 2021-07-27 DIAGNOSIS — R300 Dysuria: Secondary | ICD-10-CM

## 2021-07-27 DIAGNOSIS — L03116 Cellulitis of left lower limb: Secondary | ICD-10-CM | POA: Insufficient documentation

## 2021-07-27 DIAGNOSIS — Z6835 Body mass index (BMI) 35.0-35.9, adult: Secondary | ICD-10-CM | POA: Insufficient documentation

## 2021-07-27 DIAGNOSIS — Z7901 Long term (current) use of anticoagulants: Secondary | ICD-10-CM | POA: Insufficient documentation

## 2021-07-27 DIAGNOSIS — L089 Local infection of the skin and subcutaneous tissue, unspecified: Secondary | ICD-10-CM | POA: Diagnosis not present

## 2021-07-27 DIAGNOSIS — Z87440 Personal history of urinary (tract) infections: Secondary | ICD-10-CM | POA: Diagnosis not present

## 2021-07-27 DIAGNOSIS — I5032 Chronic diastolic (congestive) heart failure: Secondary | ICD-10-CM | POA: Diagnosis not present

## 2021-07-27 DIAGNOSIS — I10 Essential (primary) hypertension: Secondary | ICD-10-CM | POA: Diagnosis not present

## 2021-07-27 DIAGNOSIS — I110 Hypertensive heart disease with heart failure: Secondary | ICD-10-CM | POA: Diagnosis not present

## 2021-07-27 DIAGNOSIS — I4891 Unspecified atrial fibrillation: Secondary | ICD-10-CM | POA: Diagnosis not present

## 2021-07-27 DIAGNOSIS — S72012D Unspecified intracapsular fracture of left femur, subsequent encounter for closed fracture with routine healing: Secondary | ICD-10-CM | POA: Diagnosis not present

## 2021-07-27 DIAGNOSIS — M79605 Pain in left leg: Secondary | ICD-10-CM | POA: Diagnosis present

## 2021-07-27 DIAGNOSIS — Z96642 Presence of left artificial hip joint: Secondary | ICD-10-CM | POA: Diagnosis not present

## 2021-07-27 LAB — CBC WITH DIFFERENTIAL/PLATELET
Abs Immature Granulocytes: 0.03 10*3/uL (ref 0.00–0.07)
Basophils Absolute: 0.1 10*3/uL (ref 0.0–0.1)
Basophils Absolute: 0.1 10*3/uL (ref 0.0–0.1)
Basophils Relative: 0.7 % (ref 0.0–3.0)
Basophils Relative: 1 %
Eosinophils Absolute: 0.3 10*3/uL (ref 0.0–0.7)
Eosinophils Absolute: 0.4 10*3/uL (ref 0.0–0.5)
Eosinophils Relative: 3.8 % (ref 0.0–5.0)
Eosinophils Relative: 5 %
HCT: 35.4 % — ABNORMAL LOW (ref 36.0–46.0)
HCT: 35.5 % — ABNORMAL LOW (ref 36.0–46.0)
Hemoglobin: 11.6 g/dL — ABNORMAL LOW (ref 12.0–15.0)
Hemoglobin: 11.6 g/dL — ABNORMAL LOW (ref 12.0–15.0)
Immature Granulocytes: 0 %
Lymphocytes Relative: 15.3 % (ref 12.0–46.0)
Lymphocytes Relative: 13 %
Lymphs Abs: 1.2 10*3/uL (ref 0.7–4.0)
Lymphs Abs: 1.1 10*3/uL (ref 0.7–4.0)
MCH: 30.2 pg (ref 26.0–34.0)
MCHC: 32.8 g/dL (ref 30.0–36.0)
MCHC: 32.7 g/dL (ref 30.0–36.0)
MCV: 90.6 fl (ref 78.0–100.0)
MCV: 92.4 fL (ref 80.0–100.0)
Monocytes Absolute: 0.7 10*3/uL (ref 0.1–1.0)
Monocytes Absolute: 0.8 10*3/uL (ref 0.1–1.0)
Monocytes Relative: 8.3 % (ref 3.0–12.0)
Monocytes Relative: 9 %
Neutro Abs: 5.7 10*3/uL (ref 1.4–7.7)
Neutro Abs: 6 10*3/uL (ref 1.7–7.7)
Neutrophils Relative %: 71.9 % (ref 43.0–77.0)
Neutrophils Relative %: 72 %
Platelets: 387 10*3/uL (ref 150.0–400.0)
Platelets: 420 10*3/uL — ABNORMAL HIGH (ref 150–400)
RBC: 3.91 Mil/uL (ref 3.87–5.11)
RBC: 3.84 MIL/uL — ABNORMAL LOW (ref 3.87–5.11)
RDW: 15.2 % (ref 11.5–15.5)
RDW: 14.6 % (ref 11.5–15.5)
WBC: 7.9 10*3/uL (ref 4.0–10.5)
WBC: 8.4 10*3/uL (ref 4.0–10.5)
nRBC: 0 % (ref 0.0–0.2)

## 2021-07-27 LAB — COMPREHENSIVE METABOLIC PANEL
ALT: 21 U/L (ref 0–35)
ALT: 23 U/L (ref 0–44)
AST: 18 U/L (ref 0–37)
AST: 20 U/L (ref 15–41)
Albumin: 3.7 g/dL (ref 3.5–5.2)
Albumin: 3.3 g/dL — ABNORMAL LOW (ref 3.5–5.0)
Alkaline Phosphatase: 71 U/L (ref 39–117)
Alkaline Phosphatase: 66 U/L (ref 38–126)
Anion gap: 8 (ref 5–15)
BUN: 11 mg/dL (ref 6–23)
BUN: 12 mg/dL (ref 8–23)
CO2: 32 mEq/L (ref 19–32)
CO2: 25 mmol/L (ref 22–32)
Calcium: 10.3 mg/dL (ref 8.4–10.5)
Calcium: 10.4 mg/dL — ABNORMAL HIGH (ref 8.9–10.3)
Chloride: 104 mEq/L (ref 96–112)
Chloride: 107 mmol/L (ref 98–111)
Creatinine, Ser: 0.67 mg/dL (ref 0.40–1.20)
Creatinine, Ser: 0.62 mg/dL (ref 0.44–1.00)
GFR, Estimated: >60 mL/min (ref >60)
GFR: 86.6 mL/min (ref >60.00)
Glucose, Bld: 97 mg/dL (ref 70–99)
Glucose, Bld: 110 mg/dL — ABNORMAL HIGH (ref 70–99)
Potassium: 3.9 mEq/L (ref 3.5–5.1)
Potassium: 3.9 mmol/L (ref 3.5–5.1)
Sodium: 140 mEq/L (ref 135–145)
Sodium: 140 mmol/L (ref 135–145)
Total Bilirubin: 0.7 mg/dL (ref 0.2–1.2)
Total Bilirubin: 0.5 mg/dL (ref 0.3–1.2)
Total Protein: 6 g/dL (ref 6.0–8.3)
Total Protein: 6.5 g/dL (ref 6.5–8.1)

## 2021-07-27 LAB — POCT URINALYSIS DIPSTICK
Bilirubin, UA: NEGATIVE
Blood, UA: NEGATIVE
Glucose, UA: NEGATIVE
Ketones, UA: NEGATIVE
Leukocytes, UA: NEGATIVE
Nitrite, UA: NEGATIVE
Protein, UA: NEGATIVE
Spec Grav, UA: 1.01 (ref 1.010–1.025)
Urobilinogen, UA: 0.2 E.U./dL
pH, UA: 6.5 (ref 5.0–8.0)

## 2021-07-27 LAB — LACTIC ACID, PLASMA: Lactic Acid, Venous: 1.3 mmol/L (ref 0.5–1.9)

## 2021-07-27 LAB — IRON: Iron: 37 ug/dL — ABNORMAL LOW (ref 42–145)

## 2021-07-27 LAB — BRAIN NATRIURETIC PEPTIDE: Pro B Natriuretic peptide (BNP): 259 pg/mL — ABNORMAL HIGH (ref 0.0–100.0)

## 2021-07-27 MED ORDER — LEVOFLOXACIN 500 MG PO TABS: 500.0000 mg | ORAL_TABLET | Freq: Every day | ORAL | 0 refills | Status: DC

## 2021-07-27 MED ORDER — GABAPENTIN 100 MG PO CAPS: 100.0000 mg | ORAL_CAPSULE | Freq: Three times a day (TID) | ORAL | 0 refills | Status: DC

## 2021-07-27 MED ORDER — SODIUM CHLORIDE 0.9 % IV SOLN
1.0000 g | Freq: Once | INTRAVENOUS | Status: AC
  Administered 2021-07-27: 1 g via INTRAVENOUS
  Filled 2021-07-27: qty 10

## 2021-07-27 MED ORDER — SULFAMETHOXAZOLE-TRIMETHOPRIM 800-160 MG PO TABS: 1.0000 | ORAL_TABLET | Freq: Two times a day (BID) | ORAL | 0 refills | Status: DC

## 2021-07-27 MED ORDER — CEPHALEXIN 500 MG PO CAPS: ORAL_CAPSULE | ORAL | 0 refills | Status: DC

## 2021-07-27 NOTE — ED Triage Notes (Signed)
Patient presents due to what she believes is an infection on her left lower leg. She was seen by her primary MD today who agreed. She was given bactrim and took one dose. She believes she may need IV antibiotics due to it spreading. The area started as a tender area 2 days ago.

## 2021-07-27 NOTE — Progress Notes (Signed)
Subjective:    Patient ID: Veronica Bradford, female    DOB: 1945/11/12, 76 y.o.   MRN: [id number]  HPI Pt thinks she has uti. Pt was catheterized during surgery. Pt has hx of uti and was followed by Dr. Felipa Eth in [place]. On July 20 th she states started to feel some mild pain on urination and fever 101.2. pt called orthopedic PA and notified them of at home + leukocyte esterase. Pt had old macrobid rx. She took 5 days worth of med. She still has discomfort in suprapubic area. And states at home test still postiive for leukocyte esterace.  On review allergy to pcn/amoxillin.   She has back pain as well as left hip pain after  recent surgery. Pt states had left hip fracture and surgery on July 18, 2021. Pt  has been on norco 10-325 mg. States does not help much with pain. She states does confuse her. Pain level at best is 4-5/10 after 1/2 norco. She states did use full doses but level of pain never went below 5/10 over the best.  On review hx of spinal stenosis.   Pt state was on xarelto prior  to procedure. The was bridged for procedure. Pt states recently restarted back on xarelto.   She states has large bruise to her left leg.  Hx of atrial fibrillation. Pt recently had maxide stopped by cardiologist. This past Saturday weight was 243 lb. This past week cardiologist restarted 3 days of lasix 40 mg bid for 3 days. Last dose was yesterday during the day time. Recently on diltiazem 180 mg. Weight this morning was 235 lb.  Some anemia in labs recently.     Review of Systems See hpi   Past Medical History:  Diagnosis Date   Atrial fibrillation ([place])    Back pain    Chronic sinusitis    s/p 2 surgeries remotely, [name]   Closed fracture of metatarsal of left foot    L foot, fifth metatarsal   Ectopic pregnancy    Endometrial cancer ([place]) 2004   Early diagnosis   GERD (gastroesophageal reflux disease)    Hyperlipemia    Hypertension    IBS (irritable bowel syndrome)  08/13/2014   Dx 2015, diarrhea on-off, Dr [name]    Lumbar radiculopathy 08/2012   MRI in 08/2012   Mild hyperparathyroidism (HCC)    Osteopenia    PAF (paroxysmal atrial fibrillation) (HCC)    Prediabetes    Sciatica of right side 08/2013   Received Depomedrol 80 mg injection   Sleep apnea 09/09/2013   Dx with OSA in 2015, by Dr. 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  Tobacco Use   Smoking status: Former    Types: Cigarettes    Quit date: 01/06/1980    Years since quitting: 41.5   Smokeless tobacco: Never   Tobacco comments:    smoked from [place] to 1982, less than 1 ppd  Vaping Use   Vaping Use: Never used  Substance and Sexual Activity   Alcohol use: Yes    Comment: rare   Drug use: No   Sexual activity: Not Currently    Birth control/protection: None  Other Topics Concern  Not on file  Social History Narrative   Lives w/ husband, and son [name]   Retired Marine scientist   Lives in [place] Strain: [place]  (06/08/2020)   Overall Financial Resource Strain ([place])    Difficulty of Paying Living Expenses: Not hard at all  Food Insecurity: No [place] (06/08/2020)   Hunger Vital Sign    Worried About Running Out of Food in the Last Year: Never true    [place] in the Last Year: Never true  Transportation Needs: No Transportation Needs (06/08/2020)   PRAPARE - Hydrologist (Medical): No    Lack of Transportation (Non-Medical): No  Physical Activity: Sufficiently Active (06/08/2020)   Exercise Vital Sign    Days of Exercise per Week: 7 days    Minutes of Exercise per Session: 30 min  Stress: No Stress Concern Present (06/08/2020)   [place]    Feeling of Stress : Not at all  Social Connections: Moderately Integrated (06/08/2020)   Social Connection and Isolation Panel [NHANES]    Frequency of Communication with Friends and Family: More than three times a week    Frequency of Social Gatherings with Friends and Family: Once a week    Attends Religious Services: Never    Marine scientist or Organizations: Yes    Attends Music therapist: More than 4 times per year    Marital Status: Married  Human resources officer Violence: Not At Risk (06/08/2020)   Humiliation, Afraid, Rape, and Kick questionnaire    Fear of Current or Ex-Partner: No    Emotionally Abused: No    Physically Abused: No    Sexually Abused: No    Past Surgical History:  Procedure Laterality Date   ABDOMINAL HYSTERECTOMY     ATRIAL FIBRILLATION ABLATION N/A 09/04/2017   Procedure: ATRIAL FIBRILLATION ABLATION;  Surgeon: [name], MD;  Location: [facility];  Service: Cardiovascular;  Laterality: N/A;   BIOPSY  03/11/2020   Procedure: BIOPSY;  Surgeon: [name], MD;  Location: [facility];  Service: Endoscopy;;  EGD and COLON   CARPAL TUNNEL RELEASE Right 06/17/2019   CARPAL TUNNEL RELEASE Left 11/2020   CHOLECYSTECTOMY  ~2006   COLONOSCOPY WITH PROPOFOL N/A 03/11/2020   Procedure: COLONOSCOPY WITH PROPOFOL;  Surgeon: [name], MD;  Location: [facility];  Service: Endoscopy;  Laterality: N/A;   DILATION AND CURETTAGE OF UTERUS     after SAB   ESOPHAGOGASTRODUODENOSCOPY  02/09/2014   Dr. 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/2020   Procedure: PACEMAKER IMPLANT;  Surgeon: [name], MD;  Location: [facility];  Service: Cardiovascular;  Laterality: N/A;   TONSILLECTOMY     TOTAL ABDOMINAL HYSTERECTOMY W/ BILATERAL SALPINGOOPHORECTOMY     TOTAL HIP ARTHROPLASTY Left 07/18/2021   Procedure: TOTAL HIP ARTHROPLASTY ANTERIOR APPROACH;  Surgeon: Rod Can, MD;  Location: [facility];  Service: Orthopedics;  Laterality: Left;   TOTAL KNEE ARTHROPLASTY Right ~2008    Family History  Problem Relation Age of Onset   Hypertension Mother  Colon cancer Mother    Breast cancer Mother    Ovarian cancer Mother    Hypertension Father    Diabetes Father    Head & neck cancer Sister    CAD Neg Hx     Allergies  Allergen Reactions   Ibuprofen Other (See Comments)    Pt is on Xarelto   Lactose Intolerance (Gi) Diarrhea and Nausea And Vomiting   Lisinopril Cough   Nsaids Other (See Comments)    Pt on Xarelto   Tape     Causes blisters    Tolmetin Other (See Comments)    Pt on Xarelto   Zoster Vaccine Live Other (See Comments)    Serum sickness, high fever, neck pain    Amoxicillin Rash   Penicillins Rash    Local reaction    Current Outpatient Medications on File Prior to Visit  Medication Sig Dispense Refill   acetaminophen (TYLENOL) 500 MG tablet Take 500 mg by mouth every 6 (six) hours as needed for moderate pain or headache.     ARTIFICIAL TEAR SOLUTION OP Place 1 drop into both eyes 4 (four) times daily.     cholecalciferol (VITAMIN D3) 25 MCG (1000 UNIT) tablet Take 1,000 Units by mouth daily.     CRANBERRY EXTRACT PO Take 2 tablets by mouth daily.     Cyanocobalamin (VITAMIN B 12 PO) Take 1 tablet by mouth daily.     diltiazem (CARDIZEM CD) 180 MG 24 hr capsule Take 1 capsule (180 mg total) by mouth daily. 30 capsule 1   diltiazem (CARDIZEM) 30 MG tablet Take 1 tablet every 4 hours AS NEEDED for heart rate >100 as long as blood pressure >100. 45 tablet 1   Ferrous Sulfate (IRON PO) Take 1 tablet by mouth daily.     flecainide (TAMBOCOR) 100  MG tablet Take 1 tablet (100 mg total) by mouth 2 (two) times daily. 180 tablet 3   fluticasone (FLONASE) 50 MCG/ACT nasal spray Place 1 spray into both nostrils every other day.     FOLIC ACID PO Take 1 tablet by mouth daily.     furosemide (LASIX) 40 MG tablet Take 1 tablet (40 mg total) by mouth daily. 90 tablet 3   HYDROcodone-acetaminophen (NORCO) 10-325 MG tablet Take 0.5 tablets by mouth every 4 (four) hours as needed for up to 7 days for severe pain or moderate pain. 30 tablet 0   methocarbamol (ROBAXIN) 500 MG tablet Take 1 tablet (500 mg total) by mouth every 6 (six) hours as needed for muscle spasms. 30 tablet 0   Omega-3 Fatty Acids (FISH OIL PO) Take 1 tablet by mouth daily.     pantoprazole (PROTONIX) 40 MG tablet Take 1 tablet (40 mg total) by mouth 2 (two) times daily. (Patient taking differently: Take 40 mg by mouth daily.)     pravastatin (PRAVACHOL) 40 MG tablet TAKE 1 TABLET(40 MG) BY MOUTH AT BEDTIME (Patient taking differently: Take 40 mg by mouth daily.) 90 tablet 1   rivaroxaban (XARELTO) 20 MG TABS tablet Take 1 tablet (20 mg total) by mouth daily with supper. 90 tablet 3   triamterene-hydrochlorothiazide (MAXZIDE-25) 37.5-25 MG tablet Take 0.5 tablets by mouth daily. Please hold this medication, please discuss with your pcp and cardiology about resuming this meds     No current facility-administered medications on file prior to visit.    Pulse 68   Temp 98.6 F (37 C)   Resp 18   Ht '5\' 8"'$  (1.727 m)  Wt 235 lb 9.6 oz (106.9 kg) Comment: pt reported  SpO2 98%   BMI 35.82 kg/m        Objective:   Physical Exam  General Mental Status- Alert. General Appearance- Not in acute distress.   Skin General: Color- Normal Color. Moisture- Normal Moisture.  Neck Carotid Arteries- Normal color. Moisture- Normal Moisture. No carotid bruits. No JVD.  Chest and Lung Exam Auscultation: Breath Sounds:-Normal.  Cardiovascular Auscultation:Rythm- Regular. Murmurs &  Other Heart Sounds:Auscultation of the heart reveals- No Murmurs.  Abdomen Inspection:-Inspeection Normal. Palpation/Percussion:Note:No mass. Palpation and Percussion of the abdomen reveal- Non Tender, Non Distended + BS, no rebound or guarding.  Neurologic Cranial Nerve exam:- CN III-XII intact(No nystagmus), symmetric smile. Strength:- 5/5 equal and symmetric strength both upper and lower extremities.   Left leg- lateral aspect of left leg red warm and tender. 6 inch x 4 in red warm  and tender area. No fluctuance presently. No abscess formation. Calf not swollen and negative homans sign.   Left hip- has dressing can't evaluate well.      Assessment & Plan:   Patient Instructions  Various issues of concern post hospitalization for left hip fracture/surgery. Severe type pain in both hip and lower back(hx of stenosis). Continue norco 10/325 1/2 tab every 6 hours as needed. Discussed various options. Decided to add gabapentin 1 tab at night for one week. Then twice a day second week and then 3rd week three times weekly.  Other meds to consider medrol or higher potent narcotic per ortho advise or pcp.  Paroxysmal atrial fibrilllation, recent weight gain and diastolic dysfunction. Will get cxr, bnp and cmp today. Cxr as well. Continue xarelto and current cardiac medications prescribed by cardiologist.  Anemia and recently hospital.  We will recheck CBC today.  Also check iron level.  Recent urinary tract infection symptoms that did not respond to 5 days of Macrobid.  Urine culture pending.  Considered antibiotic that could cover skin infection and UTI.  Initially considered levofloxacin but on review with pharmacist not recommended with flecainide use.  On further discussion Bactrim DS has very low risk of interaction.  I think if the visit good option to cover skin infection and potential UTI.  You have appointment with Dr. [name] PCP tomorrow.  I will send him a copy of this note and see if  he wants you to keep that appointment or not.   Mackie Pai, PA-C   Time spent with patient today was  54 minutes which consisted of chart review, discussing diagnosis, work up treatment and documentation.

## 2021-07-27 NOTE — ED Provider Triage Note (Signed)
Emergency Medicine Provider Triage Evaluation Note  Veronica Bradford , a 76 y.o. female  was evaluated in triage.  Pt complains of left calf swelling and redness.  Patient had a recent hip surgery on the left side.  Over the past couple of days she has developed worsening left lower leg swelling as well as redness.  She was seen by her orthopedic doctor who said she had a negative Homans' sign and did not think that she had blood clots.  She is on Xarelto.  She was diagnosed with cellulitis.  She started her Bactrim this afternoon.  She took 1 dose, but she noticed that the redness with swelling into her foot as well as the pain worsening.  She is concerned she has a worsening systemic symptoms or sepsis.    Review of Systems  Positive:  Negative:   Physical Exam  BP (!) 154/71 (BP Location: Right Arm)   Pulse 64   Temp 99 F (37.2 C) (Oral)   Resp 18   Ht '5\' 8"'$  (1.727 m)   Wt 106.6 kg   SpO2 95%   BMI 35.73 kg/m  Gen:   Awake, no distress   Resp:  Normal effort  MSK:   Moves extremities without difficulty  Other:  Left lower calf with swelling and erythema.  Erythema extends into the left foot.  No abnormal drainage or any cuts that I can see.  He has 2+ pedal pulse.  Medical Decision Making  Medically screening exam initiated at 8:31 PM.  Appropriate orders placed.  Veronica Bradford was informed that the remainder of the evaluation will be completed by another provider, this initial triage assessment does not replace that evaluation, and the importance of remaining in the ED until their evaluation is complete.     Adolphus Birchwood, [place] 07/27/21 2031

## 2021-07-27 NOTE — Discharge Instructions (Addendum)
Return for rapid spreading redness or fever.

## 2021-07-27 NOTE — ED Notes (Signed)
Save blue tube in main lab °

## 2021-07-27 NOTE — ED Provider Notes (Signed)
[facility] DEPT Provider Note   CSN: [id number] Arrival date & time: 07/27/21  1958     History  Chief Complaint  Patient presents with   Leg Pain    Veronica Bradford is a 76 y.o. female.  76 yo F with a chief complaints of left leg pain and swelling.  The patient was recently hospitalized for a hip fracture and is status post repair.  She had let her orthopedist know that she was having some leg pain and swelling and they wanted her to come to the emergency department for rule out of DVT.  She is currently on Xarelto and denies any missed doses other than coming off for the surgery.  She denies any trauma to the area.  She is concerned that she has cellulitis.  She is a retired Marine scientist.  She feels like she would benefit from a dose of IV antibiotics.  Was started on Bactrim earlier today by her PCP.   Leg Pain      Home Medications Prior to Admission medications   Medication Sig Start Date End Date Taking? Authorizing Provider  cephALEXin (KEFLEX) 500 MG capsule 2 caps po bid x 7 days 07/27/21  Yes Deno Etienne, DO  acetaminophen (TYLENOL) 500 MG tablet Take 500 mg by mouth every 6 (six) hours as needed for moderate pain or headache.    [provider]  ARTIFICIAL TEAR SOLUTION OP Place 1 drop into both eyes 4 (four) times daily.    [provider]  cholecalciferol (VITAMIN D3) 25 MCG (1000 UNIT) tablet Take 1,000 Units by mouth daily.    [provider]  CRANBERRY EXTRACT PO Take 2 tablets by mouth daily.    [provider]  Cyanocobalamin (VITAMIN B 12 PO) Take 1 tablet by mouth daily.    [provider]  diltiazem (CARDIZEM CD) 180 MG 24 hr capsule Take 1 capsule (180 mg total) by mouth daily. 07/21/21   Florencia Reasons, MD  diltiazem (CARDIZEM) 30 MG tablet Take 1 tablet every 4 hours AS NEEDED for heart rate >100 as long as blood pressure >100. 01/27/21   Sherran Needs, NP  Ferrous Sulfate (IRON PO) Take 1 tablet  by mouth daily.    [provider]  flecainide (TAMBOCOR) 100 MG tablet Take 1 tablet (100 mg total) by mouth 2 (two) times daily. 02/03/21   [name], MD  fluticasone [facility]) 50 MCG/ACT nasal spray Place 1 spray into both nostrils every other day.    [provider]  FOLIC ACID PO Take 1 tablet by mouth daily.    [provider]  furosemide (LASIX) 40 MG tablet Take 1 tablet (40 mg total) by mouth daily. 01/04/21   [name], MD  gabapentin (NEURONTIN) 100 MG capsule Take 1 capsule (100 mg total) by mouth 3 (three) times daily. 07/27/21   Saguier, Percell Miller, PA-C  HYDROcodone-acetaminophen (NORCO) 10-325 MG tablet Take 0.5 tablets by mouth every 4 (four) hours as needed for up to 7 days for severe pain or moderate pain. 07/21/21 07/28/21  Florencia Reasons, MD  levofloxacin (LEVAQUIN) 500 MG tablet Take 1 tablet (500 mg total) by mouth daily for 7 days. 07/27/21 08/03/21  Saguier, Percell Miller, PA-C  methocarbamol (ROBAXIN) 500 MG tablet Take 1 tablet (500 mg total) by mouth every 6 (six) hours as needed for muscle spasms. 07/21/21   Florencia Reasons, MD  Omega-3 Fatty Acids (FISH OIL PO) Take 1 tablet by mouth daily.  [provider]  pantoprazole (PROTONIX) 40 MG tablet Take 1 tablet (40 mg total) by mouth 2 (two) times daily. Patient taking differently: Take 40 mg by mouth daily. 01/27/21   Sherran Needs, NP  pravastatin (PRAVACHOL) 40 MG tablet TAKE 1 TABLET(40 MG) BY MOUTH AT BEDTIME Patient taking differently: Take 40 mg by mouth daily. 05/10/21   Colon Branch, MD  rivaroxaban (XARELTO) 20 MG TABS tablet Take 1 tablet (20 mg total) by mouth daily with supper. 07/22/21   Geradine Girt, DO  sulfamethoxazole-trimethoprim (BACTRIM DS) 800-160 MG tablet Take 1 tablet by mouth 2 (two) times daily. 07/27/21   Saguier, Percell Miller, PA-C  triamterene-hydrochlorothiazide (MAXZIDE-25) 37.5-25 MG tablet Take 0.5 tablets by mouth daily. Please hold this medication, please discuss with your pcp  and cardiology about resuming this meds 07/21/21   Florencia Reasons, MD      Allergies    Ibuprofen, Lactose intolerance (gi), Lisinopril, Nsaids, Tape, Tolmetin, Zoster vaccine live, Amoxicillin, and Penicillins    Review of Systems   Review of Systems  Physical Exam Updated Vital Signs BP (!) 162/64   Pulse 67   Temp 99 F (37.2 C) (Oral)   Resp 17   Ht '5\' 8"'$  (1.727 m)   Wt 106.6 kg   SpO2 100%   BMI 35.73 kg/m  Physical Exam Vitals and nursing note reviewed.  Constitutional:      General: She is not in acute distress.    Appearance: She is well-developed. She is not diaphoretic.  HENT:     Head: Normocephalic and atraumatic.  Eyes:     Pupils: Pupils are equal, round, and reactive to light.  Cardiovascular:     Rate and Rhythm: Normal rate and regular rhythm.     Heart sounds: No murmur heard.    No friction rub. No gallop.  Pulmonary:     Effort: Pulmonary effort is normal.     Breath sounds: No wheezing or rales.  Abdominal:     General: There is no distension.     Palpations: Abdomen is soft.     Tenderness: There is no abdominal tenderness.  Musculoskeletal:        General: Swelling and tenderness present.     Cervical back: Normal range of motion and neck supple.     Comments: Edema to the left lower extremity compared to the right.  Worst distally than proximally, some erythema mostly overlying the lateral aspect of the left leg just above the ankle and extending over the joint.  No obvious joint tenderness with range of motion.  Pulse motor and sensation intact distally.  No crepitus no induration.  Skin:    General: Skin is warm and dry.  Neurological:     Mental Status: She is alert and oriented to person, place, and time.  Psychiatric:        Behavior: Behavior normal.     ED Results / Procedures / Treatments   Labs (all labs ordered are listed, but only abnormal results are displayed) Labs Reviewed  COMPREHENSIVE METABOLIC PANEL - Abnormal; Notable for  the following components:      Result Value   Glucose, Bld 110 (*)    Calcium 10.4 (*)    Albumin 3.3 (*)    All other components within normal limits  CBC WITH DIFFERENTIAL/PLATELET - Abnormal; Notable for the following components:   RBC 3.84 (*)    Hemoglobin 11.6 (*)    HCT 35.5 (*)    Platelets  420 (*)    All other components within normal limits  LACTIC ACID, PLASMA  LACTIC ACID, PLASMA    EKG None  Radiology DG Chest 2 View  Result Date: 07/27/2021 CLINICAL DATA:  Atrial fibrillation.  Weight gain. EXAM: CHEST - 2 VIEW COMPARISON:  Chest radiograph 07/14/2021 FINDINGS: The cardiomediastinal silhouette is within normal limits. A pacemaker remains in place with leads terminating over the right atrium and right ventricle. No airspace consolidation, edema, pleural effusion, or pneumothorax is identified. Right upper quadrant abdominal surgical clips are noted. No acute osseous abnormality is seen. IMPRESSION: No active cardiopulmonary disease. Electronically Signed   By: Logan Bores M.D.   On: 07/27/2021 12:59    Procedures Procedures    Medications Ordered in ED Medications  cefTRIAXone (ROCEPHIN) 1 g in sodium chloride 0.9 % 100 mL IVPB (has no administration in time range)    ED Course/ Medical Decision Making/ A&P                           Medical Decision Making Risk Prescription drug management.   76 yo F with a chief complaints of left leg pain and swelling.  This been going on for a few days after her surgery.  She had noticed some redness and was concerned she had cellulitis.  Saw her family doctor in the office today and they started her on Bactrim.  Since then she felt like it spread and came to the emergency department for an IV dose of antibiotics.  Her lab work is resulted without leukocytosis, no significant electrolyte abnormality no significant anemia.  Unfortunately the patient has arrived to the Emergency Department after vascular ultrasound is no  longer here.  We will order a DVT study for the morning.  I discussed risk and benefits with the patient about this IV antibiotic use for skin and soft tissue infections.  My understanding of the research is that the IV antibiotics provide no benefits and have an increased risk of diarrhea.  Despite this information the patient elected to do a trial of IV antibiotics.  We will give a dose of Rocephin here.  Also we will broaden her antibiotics as I do not feel that Bactrim covers strep well and so we will add Keflex to her regimen.  9:58 PM:  I have discussed the diagnosis/risks/treatment options with the patient and family.  Evaluation and diagnostic testing in the emergency department does not suggest an emergent condition requiring admission or immediate intervention beyond what has been performed at this time.  They will follow up with  PCP. We also discussed returning to the ED immediately if new or worsening sx occur. We discussed the sx which are most concerning (e.g., sudden worsening pain, fever, inability to tolerate by mouth, rapid spreading redness) that necessitate immediate return. Medications administered to the patient during their visit and any new prescriptions provided to the patient are listed below.  Medications given during this visit Medications  cefTRIAXone (ROCEPHIN) 1 g in sodium chloride 0.9 % 100 mL IVPB (has no administration in time range)     The patient appears reasonably screen and/or stabilized for discharge and I doubt any other medical condition or other [facility] requiring further screening, evaluation, or treatment in the ED at this time prior to discharge.          Final Clinical Impression(s) / ED Diagnoses Final diagnoses:  Cellulitis of left lower extremity    Rx /  DC Orders ED Discharge Orders          Ordered    LE VENOUS        07/27/21 2148    cephALEXin (KEFLEX) 500 MG capsule        07/27/21 2148              Deno Etienne, DO 07/27/21  2158

## 2021-07-27 NOTE — Patient Instructions (Addendum)
Various issues of concern post hospitalization for left hip fracture/surgery. Severe type pain in both hip and lower back(hx of stenosis). Continue norco 10/325 1/2 tab every 6 hours as needed. Discussed various options. Decided to add gabapentin 1 tab at night for one week. Then twice a day second week and then 3rd week three times weekly.  Other meds to consider medrol or higher potent narcotic per ortho advise or pcp.  Paroxysmal atrial fibrilllation, recent weight gain and diastolic dysfunction. Will get cxr, bnp and cmp today. Cxr as well. Continue xarelto and current cardiac medications prescribed by cardiologist.  Anemia and recently hospital.  We will recheck CBC today.  Also check iron level.  Recent urinary tract infection symptoms that did not respond to 5 days of Macrobid.  Urine culture pending.  Considered antibiotic that could cover skin infection and UTI.  Initially considered levofloxacin but on review with pharmacist not recommended with flecainide use.  On further discussion Bactrim DS has very low risk of interaction.  I think if the visit good option to cover skin infection and potential UTI.  You have appointment with Dr. [name] PCP tomorrow.  I will send him a copy of this note and see if he wants you to keep that appointment or not.

## 2021-07-28 ENCOUNTER — Ambulatory Visit (HOSPITAL_COMMUNITY)
Admission: RE | Admit: 2021-07-28 | Discharge: 2021-07-28 | Disposition: A | Discharge status: Home / Self Care | Payer: Medicare Other | Source: Ambulatory Visit | Attending: Emergency Medicine | Admitting: Emergency Medicine

## 2021-07-28 ENCOUNTER — Ambulatory Visit (HOSPITAL_COMMUNITY): Payer: Medicare Other

## 2021-07-28 ENCOUNTER — Telehealth: Payer: Self-pay

## 2021-07-28 DIAGNOSIS — M7989 Other specified soft tissue disorders: Secondary | ICD-10-CM | POA: Diagnosis not present

## 2021-07-28 DIAGNOSIS — M79605 Pain in left leg: Secondary | ICD-10-CM | POA: Diagnosis not present

## 2021-07-28 NOTE — Progress Notes (Signed)
Left lower extremity venous duplex has been completed. Preliminary results can be found in CV Proc through chart review.   07/28/21 4:24 PM Veronica Bradford RVT

## 2021-07-28 NOTE — Telephone Encounter (Signed)
Nurse Assessment Nurse: Eugenio Hoes, RN, [place] Date/Time (Eastern Time): 07/27/2021 7:20:53 PM Confirm and document reason for call. If symptomatic, describe symptoms. ---Caller States she she was seen at drs office this morning for infection in her L leg. Was given abx. States redness has spread from ankle down to foot. Caller states she is going to go to ED. Denies need for triage at this time. Does the patient have any new or worsening symptoms? ---Yes Will a triage be completed? ---No Select reason for no triage. ---Patient declined Disp. Time Eilene Ghazi Time) Disposition Final User 07/27/2021 7:01:53 PM Send to Clinical Reece Levy, RN, Rodena Piety 07/27/2021 7:25:03 PM Clinical Call Yes Eugenio Hoes, RN, [facility] Final Disposition 07/27/2021 7:25:03 PM Clinical Call Yes Eugenio Hoes, RN, [facility]

## 2021-07-29 ENCOUNTER — Ambulatory Visit (INDEPENDENT_AMBULATORY_CARE_PROVIDER_SITE_OTHER): Payer: Medicare Other | Admitting: Internal Medicine

## 2021-07-29 ENCOUNTER — Encounter: Payer: Self-pay | Admitting: Internal Medicine

## 2021-07-29 VITALS — BP 126/68 | HR 60 | Temp 98.3°F | Resp 18 | Ht 68.0 in | Wt 235.6 lb

## 2021-07-29 DIAGNOSIS — I10 Essential (primary) hypertension: Secondary | ICD-10-CM

## 2021-07-29 DIAGNOSIS — D509 Iron deficiency anemia, unspecified: Secondary | ICD-10-CM | POA: Diagnosis not present

## 2021-07-29 DIAGNOSIS — L03116 Cellulitis of left lower limb: Secondary | ICD-10-CM | POA: Diagnosis not present

## 2021-07-29 LAB — URINE CULTURE
MICRO NUMBER:: 13697129
SPECIMEN QUALITY:: ADEQUATE

## 2021-07-29 NOTE — Progress Notes (Unsigned)
Subjective:    Patient ID: Veronica Bradford, female    DOB: 1945/09/20, 76 y.o.   MRN: [id number]  DOS:  07/29/2021 Type of visit - description: Follow-up  Since the last visit, has a complex medical history which is reviewed. Admitted to hospital 07/14/2021: L hip fracture, arthroplasty 07/18/2021, discharge 07/21/2021. Paroxysmal A-fib, RVR during admission, Cardizem dose adjusted.  Was cleared for surgery. CHF: Compensated. Was discharged home.  After the admission, she developed low-grade fever, home urine dip was +, her surgeon sent a prescription for Macrodantin per patient.  07/27/2021: Was seen at this office by [place] PA: Patient felt she had a UTI, see above. Severe hip and back pain:  Urine culture shows Pseudomonas (pansensitive). White count not elevated, BMP slightly elevated.  Chest x-ray no acute. Was Rx Bactrim (Levaquin interacted with other medications).  07/27/2021: After the visit here, She decided to go to the emergency room due to left leg pain and swelling.  She noted some redness and she was concerned about cellulitis. Work-up: No leukocytosis, CMP essentially stable. Ultrasound: No DVT, portion of the exams were limited Got Rocephin x1 and cephalexin prescription.   Review of Systems She is here with her husband for a follow-up from all of the above. Since the ER visit she feels that the L leg swelling has improved. No further fever or chills She really never had and does not have now any LUTS. Due to edema and weight gain, she reports that cardiology recommended Lasix 40 mg twice daily x3 days.  Since then she has lost 8 pounds.  BP is holding around 130s, 150s at home.  Past Medical History:  Diagnosis Date   Atrial fibrillation ([place])    Back pain    Chronic sinusitis    s/p 2 surgeries remotely, [name]   Closed fracture of metatarsal of left foot    L foot, fifth metatarsal   Ectopic pregnancy    Endometrial cancer ([place]) 2004   Early  diagnosis   GERD (gastroesophageal reflux disease)    Hyperlipemia    Hypertension    IBS (irritable bowel syndrome) 08/13/2014   Dx 2015, diarrhea on-off, Dr [name]    Lumbar radiculopathy 08/2012   MRI in 08/2012   Mild hyperparathyroidism ([place])    Osteopenia    PAF (paroxysmal atrial fibrillation) (HCC)    Prediabetes    Sciatica of right side 08/2013   Received Depomedrol 80 mg injection   Sleep apnea 09/09/2013   Dx with OSA in 2015, by Dr. Caryl Comes, APAP    Past Surgical History:  Procedure Laterality Date   ABDOMINAL HYSTERECTOMY     ATRIAL FIBRILLATION ABLATION N/A 09/04/2017   Procedure: [place];  Surgeon: [name], MD;  Location: [facility];  Service: Cardiovascular;  Laterality: N/A;   BIOPSY  03/11/2020   Procedure: BIOPSY;  Surgeon: [name], MD;  [facility];  Service: Endoscopy;;  EGD and COLON   CARPAL TUNNEL RELEASE Right 06/17/2019   CARPAL TUNNEL RELEASE Left 11/2020   CHOLECYSTECTOMY  ~2006   COLONOSCOPY WITH PROPOFOL N/A 03/11/2020   Procedure: COLONOSCOPY WITH PROPOFOL;  Surgeon: [name], MD;  [facility];  Service: Endoscopy;  Laterality: N/A;   DILATION AND CURETTAGE OF UTERUS     after SAB   ESOPHAGOGASTRODUODENOSCOPY  02/09/2014   Dr. [name]   ESOPHAGOGASTRODUODENOSCOPY (EGD) WITH PROPOFOL N/A 03/11/2020   Procedure: ESOPHAGOGASTRODUODENOSCOPY (EGD) WITH PROPOFOL;  Surgeon: [name], MD;  Location: [facility];  Service: Endoscopy;  Laterality: N/A;   FINGER SURGERY Left    index   NASAL SINUS SURGERY     x 2 remotely, [name]   PACEMAKER IMPLANT N/A 09/13/2020   Procedure: PACEMAKER IMPLANT;  Surgeon: [name], MD;  Location: [facility];  Service: Cardiovascular;  Laterality: N/A;   TONSILLECTOMY     TOTAL ABDOMINAL HYSTERECTOMY W/ BILATERAL SALPINGOOPHORECTOMY     TOTAL HIP ARTHROPLASTY Left 07/18/2021   Procedure: TOTAL HIP ARTHROPLASTY ANTERIOR APPROACH;   Surgeon: Rod Can, MD;  [facility];  Service: Orthopedics;  Laterality: Left;   TOTAL KNEE ARTHROPLASTY Right ~2008    Current Outpatient Medications  Medication Instructions   acetaminophen (TYLENOL) 500 mg, Oral, Every 6 hours PRN   ARTIFICIAL TEAR SOLUTION OP 1 drop, Both Eyes, 4 times daily   cephALEXin (KEFLEX) 500 MG capsule 2 caps po bid x 7 days   cholecalciferol (VITAMIN D3) 1,000 Units, Oral, Daily   CRANBERRY EXTRACT PO 2 tablets, Oral, Daily   Cyanocobalamin (VITAMIN B 12 PO) 1 tablet, Oral, Daily   diltiazem (CARDIZEM CD) 180 mg, Oral, Daily   diltiazem (CARDIZEM) 30 MG tablet Take 1 tablet every 4 hours AS NEEDED for heart rate >100 as long as blood pressure >100.   Ferrous Sulfate (IRON PO) 1 tablet, Oral, Daily   flecainide (TAMBOCOR) 100 mg, Oral, 2 times daily   fluticasone (FLONASE) 50 MCG/ACT nasal spray 1 spray, Each Nare, Every other day   FOLIC ACID PO 1 tablet, Oral, Daily   furosemide (LASIX) 40 mg, Oral, Daily   gabapentin (NEURONTIN) 100 mg, Oral, 3 times daily   methocarbamol (ROBAXIN) 500 mg, Oral, Every 6 hours PRN   Omega-3 Fatty Acids (FISH OIL PO) 1 tablet, Oral, Daily   pantoprazole (PROTONIX) 40 mg, Oral, 2 times daily   pravastatin (PRAVACHOL) 40 MG tablet TAKE 1 TABLET(40 MG) BY MOUTH AT BEDTIME   rivaroxaban (XARELTO) 20 mg, Oral, Daily with supper   sulfamethoxazole-trimethoprim (BACTRIM DS) 800-160 MG tablet 1 tablet, Oral, 2 times daily   triamterene-hydrochlorothiazide (MAXZIDE-25) 37.5-25 MG tablet 0.5 tablets, Oral, Daily, Please hold this medication, please discuss with your pcp and cardiology about resuming this meds       Objective:   Physical Exam BP 126/68 Comment: Pt reported  Pulse 60   Temp 98.3 F (36.8 C) (Oral)   Resp 18   Ht '5\' 8"'$  (1.727 m)   Wt 235 lb 9 oz (106.9 kg)   SpO2 96%   BMI 35.82 kg/m  General:   Well developed, NAD, BMI noted. HEENT:  Normocephalic . Face symmetric, atraumatic Lungs:  CTA  B Normal respiratory effort, no intercostal retractions, no accessory muscle use. Heart: Seems regular Lower extremities: R leg: Normal L leg: + Redness, warmness around the leg just above the ankle.  The calf itself is a slightly swelling and mildly TTP.  See picture.  Good pedal pulse.  Skin: Not pale. Not jaundice Neurologic:  alert & oriented X3.  Speech normal, gait: Not tested Psych--  Cognition and judgment appear intact.  Cooperative with normal attention span and concentration.  Behavior appropriate. No anxious or depressed appearing.       Assessment     Assessment: Prediabetes HTN Hyperlipidemia Chronic lower extremity edema L>R (eval by previous pcp, related to varicose veins?) CV:  -- P. Atrial fibrillation, A. flutter: on xarelto flecainide-diltiazem  prn --Ablation 09/2017 --Pacemaker 09-2020 GI: Dr [name]  --GERD (zantac or  protonix prn), IBS, chronic diarrhea --GI records: [place], 2004, 2009 8 and 07-2013. Negative for polyps or IBD. + Diverticuli. EGD/cscope 11/2016; EGD/Cscope 03/11/2020   --Increased LFTs: CT abdomen 05/2016: Normal liver. 05/2017: WNL  Hepatitis B and C, Alpha 1 antitrypsin, anti-smooth muscle antibody, ANA trans-ferritin  OSA--- CPAP, Dr. Maxwell Caul Osteopenia:  Multiple  Dexas, last few:  T score 2009  -1.1; T score 2015  -0.7; T score 05/2017 -1.8 DJD --Chronic back pain-- tylenol #3 prn --- RF per PCP --history of epidurals in the past, MRI 2014: Scoliosis,DJD --CIPRO pre dental d/t knee replacement (rx by ortho) H/o endometrial cancer, found during a hysterectomy, no chemotherapy or XRT.  Primary hyperparathyroidism: Chronic hypercalcemia, increased PTH 05/2016, refered to ENT  PLAN:  Very complex recent medical history reviewed. Left hip arthroplasty: Recovering from surgery after traumatic left hip fracture.  Cellulitis, L leg: Findings consistent with cellulitis, currently with no fever or chills, edema is better.   Recent ultrasound showed no DVT.  Advised patient that I think things are going in the right direction, continue taking Bactrim prescribed at this office and cephalexin added by the ER physician. Call if red flag symptoms, see AVS Pseudomonas present in the urine culture: The patient had bladder catheter briefly during recent admission to the hospital, she is currently having no fever and no LUTS.  Unclear the significance of such culture.  Observation for now, consider recheck a urine culture in the future. HTN: Patient reports that she was advised to hold Maxide and Lasix during the most recent hospital admission.  She gained weight, contacted cardiology, was Rx Lasix twice daily and has lost 8 pounds.  Plan: Go back: Routine normal of Lasix 40 mg daily and Maxide half tablet daily.  She also takes Cardizem.  Monitor BPs. Iron deficiency: Okay to go back on iron. RTC 4 weeks.  Sooner if needed.     Here for CPX Labs from 03/18/2021: Hemoglobin 12.5. Iron 31, TIBC 406, elevated.  Ferritin 13.7, low. Hyperglycemia: Check A1c HTN: BP 128/62, continue present care. Hyperlipidemia: On Pravachol.  Labs. Atrial fibrillation, a flutter: Last visit with cardiology 02/03/2021, they noted  >>  self terminating A-fib episodes, rec higher dose of flecainide first and consider dofetilide.  Continue anticoagulation Osteopenia: T score - 1.7 11-2019 , stable compared to 2019. Rx calcium and vitamin D supplements.  Next DEXA 11-2021 Thyroid nodule: On exam today, R thyroid site is enlarged, had a thyroid biopsy 06/16/2020, benign.  Next [place] 2024 Vitamin D deficiency: On oral supplements, checking levels. GI bacterial overgrowth, iron deficiency: Recently diagnosed with bacterial overgrowth, treated on follow-up by GI.  She is concerned about nutrient absorption, will get O97, folic acid, Mg Primary hyperparathyroidism: Last visit with surgery 04-2019, they did not feel surgery was indicated and recommended to  monitor her PTH levels and vitamin D.  I will check those labs and refer back to surgery if needed. RTC 4 months

## 2021-07-29 NOTE — Patient Instructions (Addendum)
Go back on your regular doses of Lasix and Maxide.  Finish cephalexin and Bactrim as recommended  Leg elevation to help the swelling.  If you have fever chills, worsening leg redness, chest pain difficulty breathing: Seek medical attention  Check the  blood pressure regularly BP GOAL is between 110/65 and  135/85. If it is consistently higher or lower, let me know  Delete GO TO THE FRONT DESK, [place] back for   a checkup in 4 weeks.

## 2021-07-31 NOTE — Assessment & Plan Note (Signed)
Very complex recent medical history reviewed. Left hip arthroplasty: Recovering from surgery after traumatic left hip fracture. Cellulitis, L leg: Findings consistent with cellulitis, currently with no fever or chills, edema is better.  Recent ultrasound showed no DVT.  Advised patient that I think things are going in the right direction, continue taking Bactrim prescribed at this office and cephalexin added by the ER physician. Call if red flag symptoms, see AVS Pseudomonas present in the urine culture: The patient had bladder catheter briefly during recent admission to the hospital, she is currently having no fever and no LUTS.  Unclear the significance of  pdeudomonas in the UCX; rec observation for now, consider recheck a urine culture in the future. HTN: Patient reports that she was advised to hold Maxide and Lasix during the most recent hospital admission.  She gained weight, contacted cardiology, was Rx Lasix twice daily and has lost 8 pounds.  Plan: Go back: Routine normal of Lasix 40 mg daily and Maxide half tablet daily.  She also takes Cardizem.  Monitor BPs. Iron deficiency, h/o:  Okay to go back on iron. RTC 4 weeks.  Sooner if needed.

## 2021-08-01 ENCOUNTER — Encounter: Payer: Self-pay | Admitting: Internal Medicine

## 2021-08-01 DIAGNOSIS — S72012D Unspecified intracapsular fracture of left femur, subsequent encounter for closed fracture with routine healing: Secondary | ICD-10-CM | POA: Diagnosis not present

## 2021-08-01 DIAGNOSIS — I5032 Chronic diastolic (congestive) heart failure: Secondary | ICD-10-CM | POA: Diagnosis not present

## 2021-08-01 DIAGNOSIS — I480 Paroxysmal atrial fibrillation: Secondary | ICD-10-CM | POA: Diagnosis not present

## 2021-08-01 DIAGNOSIS — I110 Hypertensive heart disease with heart failure: Secondary | ICD-10-CM | POA: Diagnosis not present

## 2021-08-01 DIAGNOSIS — Z96642 Presence of left artificial hip joint: Secondary | ICD-10-CM | POA: Diagnosis not present

## 2021-08-01 NOTE — Telephone Encounter (Signed)
Appointment with Richardson Dopp, PA-C was canceled.

## 2021-08-02 ENCOUNTER — Inpatient Hospital Stay: Payer: Medicare Other | Admitting: Internal Medicine

## 2021-08-02 DIAGNOSIS — Z96642 Presence of left artificial hip joint: Secondary | ICD-10-CM | POA: Diagnosis not present

## 2021-08-04 ENCOUNTER — Other Ambulatory Visit: Payer: Self-pay

## 2021-08-04 ENCOUNTER — Other Ambulatory Visit (INDEPENDENT_AMBULATORY_CARE_PROVIDER_SITE_OTHER): Payer: Medicare Other

## 2021-08-04 ENCOUNTER — Encounter: Payer: Self-pay | Admitting: Internal Medicine

## 2021-08-04 DIAGNOSIS — N390 Urinary tract infection, site not specified: Secondary | ICD-10-CM | POA: Diagnosis not present

## 2021-08-04 DIAGNOSIS — Z87440 Personal history of urinary (tract) infections: Secondary | ICD-10-CM | POA: Diagnosis not present

## 2021-08-04 DIAGNOSIS — Z96642 Presence of left artificial hip joint: Secondary | ICD-10-CM | POA: Diagnosis not present

## 2021-08-04 DIAGNOSIS — I110 Hypertensive heart disease with heart failure: Secondary | ICD-10-CM | POA: Diagnosis not present

## 2021-08-04 DIAGNOSIS — I5032 Chronic diastolic (congestive) heart failure: Secondary | ICD-10-CM | POA: Diagnosis not present

## 2021-08-04 DIAGNOSIS — I480 Paroxysmal atrial fibrillation: Secondary | ICD-10-CM | POA: Diagnosis not present

## 2021-08-04 DIAGNOSIS — S72012D Unspecified intracapsular fracture of left femur, subsequent encounter for closed fracture with routine healing: Secondary | ICD-10-CM | POA: Diagnosis not present

## 2021-08-05 ENCOUNTER — Ambulatory Visit: Payer: Medicare Other | Admitting: Internal Medicine

## 2021-08-05 DIAGNOSIS — I5032 Chronic diastolic (congestive) heart failure: Secondary | ICD-10-CM | POA: Diagnosis not present

## 2021-08-05 DIAGNOSIS — S72012D Unspecified intracapsular fracture of left femur, subsequent encounter for closed fracture with routine healing: Secondary | ICD-10-CM | POA: Diagnosis not present

## 2021-08-05 DIAGNOSIS — Z96642 Presence of left artificial hip joint: Secondary | ICD-10-CM | POA: Diagnosis not present

## 2021-08-05 DIAGNOSIS — I480 Paroxysmal atrial fibrillation: Secondary | ICD-10-CM | POA: Diagnosis not present

## 2021-08-05 DIAGNOSIS — I110 Hypertensive heart disease with heart failure: Secondary | ICD-10-CM | POA: Diagnosis not present

## 2021-08-05 LAB — URINALYSIS, ROUTINE W REFLEX MICROSCOPIC
Bilirubin Urine: NEGATIVE
Hgb urine dipstick: NEGATIVE
Ketones, ur: NEGATIVE
Leukocytes,Ua: NEGATIVE
Nitrite: NEGATIVE
Specific Gravity, Urine: <=1.005 — AB (ref 1.000–1.030)
Total Protein, Urine: NEGATIVE
Urine Glucose: NEGATIVE
Urobilinogen, UA: 0.2 (ref 0.0–1.0)
pH: 7 (ref 5.0–8.0)

## 2021-08-05 LAB — URINE CULTURE
MICRO NUMBER:: 13731996
SPECIMEN QUALITY:: ADEQUATE

## 2021-08-09 DIAGNOSIS — S72012D Unspecified intracapsular fracture of left femur, subsequent encounter for closed fracture with routine healing: Secondary | ICD-10-CM | POA: Diagnosis not present

## 2021-08-09 DIAGNOSIS — Z96642 Presence of left artificial hip joint: Secondary | ICD-10-CM | POA: Diagnosis not present

## 2021-08-09 DIAGNOSIS — I480 Paroxysmal atrial fibrillation: Secondary | ICD-10-CM | POA: Diagnosis not present

## 2021-08-09 DIAGNOSIS — I5032 Chronic diastolic (congestive) heart failure: Secondary | ICD-10-CM | POA: Diagnosis not present

## 2021-08-09 DIAGNOSIS — I110 Hypertensive heart disease with heart failure: Secondary | ICD-10-CM | POA: Diagnosis not present

## 2021-08-12 DIAGNOSIS — I480 Paroxysmal atrial fibrillation: Secondary | ICD-10-CM | POA: Diagnosis not present

## 2021-08-12 DIAGNOSIS — I110 Hypertensive heart disease with heart failure: Secondary | ICD-10-CM | POA: Diagnosis not present

## 2021-08-12 DIAGNOSIS — I5032 Chronic diastolic (congestive) heart failure: Secondary | ICD-10-CM | POA: Diagnosis not present

## 2021-08-12 DIAGNOSIS — Z96642 Presence of left artificial hip joint: Secondary | ICD-10-CM | POA: Diagnosis not present

## 2021-08-12 DIAGNOSIS — S72012D Unspecified intracapsular fracture of left femur, subsequent encounter for closed fracture with routine healing: Secondary | ICD-10-CM | POA: Diagnosis not present

## 2021-08-15 ENCOUNTER — Ambulatory Visit: Payer: Medicare Other | Admitting: Physician Assistant

## 2021-08-16 ENCOUNTER — Ambulatory Visit (INDEPENDENT_AMBULATORY_CARE_PROVIDER_SITE_OTHER): Payer: Medicare Other | Admitting: Internal Medicine

## 2021-08-16 ENCOUNTER — Encounter: Payer: Self-pay | Admitting: Internal Medicine

## 2021-08-16 VITALS — BP 124/68 | HR 63 | Ht 68.0 in | Wt 234.8 lb

## 2021-08-16 DIAGNOSIS — Z950 Presence of cardiac pacemaker: Secondary | ICD-10-CM | POA: Diagnosis not present

## 2021-08-16 DIAGNOSIS — R001 Bradycardia, unspecified: Secondary | ICD-10-CM | POA: Diagnosis not present

## 2021-08-16 DIAGNOSIS — I4891 Unspecified atrial fibrillation: Secondary | ICD-10-CM | POA: Diagnosis not present

## 2021-08-16 DIAGNOSIS — I493 Ventricular premature depolarization: Secondary | ICD-10-CM

## 2021-08-16 DIAGNOSIS — I4892 Unspecified atrial flutter: Secondary | ICD-10-CM

## 2021-08-16 MED ORDER — DILTIAZEM HCL ER COATED BEADS 120 MG PO CP24: 120.0000 mg | ORAL_CAPSULE | Freq: Every day | ORAL | 3 refills | Status: DC

## 2021-08-16 NOTE — Patient Instructions (Signed)
Medication Instructions:  Your physician has recommended you make the following change in your medication:   ** Stop Cardizem CD '180mg'$   ** Begin Cardizem CD '120mg'$   - 1 capsule by mouth daily.  *If you need a refill on your cardiac medications before your next appointment, please call your pharmacy*   Lab Work: None ordered.  If you have labs (blood work) drawn today and your tests are completely normal, you will receive your results only by: [place] (if you have MyChart) OR A paper copy in the mail If you have any lab test that is abnormal or we need to change your treatment, we will call you to review the results.   Testing/Procedures: None ordered.    Follow-Up: At [facility], you and your health needs are our priority.  As part of our continuing mission to provide you with exceptional heart care, we have created designated Provider Care Teams.  These Care Teams include your primary Cardiologist (physician) and Advanced Practice Providers (APPs -  Physician Assistants and Nurse Practitioners) who all work together to provide you with the care you need, when you need it.  We recommend signing up for the patient portal called "MyChart".  Sign up information is provided on this After Visit Summary.  MyChart is used to connect with patients for Virtual Visits (Telemedicine).  Patients are able to view lab/test results, encounter notes, upcoming appointments, etc.  Non-urgent messages can be sent to your provider as well.   To learn more about what you can do with MyChart, go to [email or web address].    Your next appointment:    6 months with [name] - we will send you a reminder letter  Important Information About [place]

## 2021-08-16 NOTE — Progress Notes (Signed)
Patient ID: Veronica Bradford, female   DOB: 1945-09-25, 76 y.o.   MRN: [id number]      Patient Care Team: Colon Branch, MD as PCP - General (Internal Medicine) [name], MD as PCP - Cardiology (Cardiology) Marius Ditch, MD as Consulting Physician (Internal Medicine) [name], MD as Consulting Physician (Cardiology) Wylene Simmer, MD as Consulting Physician (Orthopedic Surgery) Armandina Gemma, MD as Consulting Physician (General Surgery) [name], MD as Consulting Physician (Gastroenterology) [place], Heather, Lupus as Consulting Physician (Optometry) Sherlynn Stalls, MD as Consulting Physician (Ophthalmology) Roseanne Kaufman, MD as Consulting Physician (Orthopedic Surgery)   HPI  Veronica Bradford is a 76 y.o. 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  She does not need to get cardioverted however.  Atrial fibrillation seems to be associated in some way with her IBS which is associated with diarrhea.  She has treated sleep apnea with good reports.   Anticoagulation with Rivaroxaban; without bleeding   More recently has had episodes of atrial fibrillation about once every month or 2 lasting 4 -9 hours,  At last visit with persistent atrial fibrillation she was set up for cardioversion; she reverted spontaneously.  When she saw Roderic Palau in the A-fib clinic last week, she was noted again to be in atrial fibrillation.  She is in sinus rhythm today.  She continues on flecainide 50 mg twice daily.  Her PVCs largely suppressed by this as well as her pacemaker with which she has felt overall much much better.  Interval hospitalization for hip fracture complicated by Afib-- she outlines the very frustrating care which she received  Arrhythmia  better  Significant edema improving       Date Cr Hgb  K Mag  5/18 0.66 14.7 3.7 2.1  12/21 0.59 13.3 3.9 2.2 6  4/22 0.58 13.7 3.6   9/22 0.71     11/22 3.5 10.2    7/23 0.62 11.6 3.9      DATE TEST EF   9/15 Myoview  65 % No ischemia  8/17 Echo   60-65 %   8/21 Myoview 71% No ischemia  8/21 Echo  60-65% LAE-mod-severe  (42.5 ml/m2)  2/23 Echo  65-70%         DATE PR interval QRSduration Dose       1/23  300 162 50  2//23 post increase  Ap 310 180 100             Past Medical History:  Diagnosis Date   Atrial fibrillation (HCC)    Back pain    Chronic sinusitis    s/p 2 surgeries remotely, [name]   Closed fracture of metatarsal of left foot    L foot, fifth metatarsal   Ectopic pregnancy    Endometrial cancer ([place]) 2004   Early diagnosis   GERD (gastroesophageal reflux disease)    Hyperlipemia    Hypertension    IBS (irritable bowel syndrome) 08/13/2014   Dx 2015, diarrhea on-off, Dr [name]    Lumbar radiculopathy 08/2012   MRI in 08/2012   Mild hyperparathyroidism (HCC)    Osteopenia    PAF (paroxysmal atrial fibrillation) (HCC)    Prediabetes    Sciatica of right side 08/2013   Received Depomedrol 80 mg injection  Sleep apnea 09/09/2013   Dx with OSA in 2015, by Dr. Caryl Comes, APAP    Past Surgical History:  Procedure Laterality Date   ABDOMINAL HYSTERECTOMY     ATRIAL FIBRILLATION ABLATION N/A 09/04/2017   Procedure: ATRIAL FIBRILLATION ABLATION;  Surgeon: [name], MD;  Location: [place] [facility];  Service: Cardiovascular;  Laterality: N/A;   BIOPSY  03/11/2020   Procedure: BIOPSY;  Surgeon: [name], MD;  [facility];  Service: Endoscopy;;  EGD and COLON   CARPAL TUNNEL RELEASE Right 06/17/2019   CARPAL TUNNEL RELEASE Left 11/2020   CHOLECYSTECTOMY  ~2006   COLONOSCOPY WITH PROPOFOL N/A 03/11/2020   Procedure: COLONOSCOPY WITH PROPOFOL;  Surgeon: [name], MD;  [facility];  Service: Endoscopy;   Laterality: N/A;   DILATION AND CURETTAGE OF UTERUS     after SAB   ESOPHAGOGASTRODUODENOSCOPY  02/09/2014   Dr. 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/2020   Procedure: PACEMAKER IMPLANT;  Surgeon: [name], MD;  Location: [facility];  Service: Cardiovascular;  Laterality: N/A;   TONSILLECTOMY     TOTAL ABDOMINAL HYSTERECTOMY W/ BILATERAL SALPINGOOPHORECTOMY     TOTAL HIP ARTHROPLASTY Left 07/18/2021   Procedure: TOTAL HIP ARTHROPLASTY ANTERIOR APPROACH;  Surgeon: Rod Can, MD;  [facility];  Service: Orthopedics;  Laterality: Left;   TOTAL KNEE ARTHROPLASTY Right ~2008    Current Outpatient Medications  Medication Sig Dispense Refill   acetaminophen (TYLENOL) 500 MG tablet Take 500 mg by mouth every 6 (six) hours as needed for moderate pain or headache.     ARTIFICIAL TEAR SOLUTION OP Place 1 drop into both eyes 4 (four) times daily.     cephALEXin (KEFLEX) 500 MG capsule 2 caps po bid x 7 days 28 capsule 0   cholecalciferol (VITAMIN D3) 25 MCG (1000 UNIT) tablet Take 1,000 Units by mouth daily.     CRANBERRY EXTRACT PO Take 2 tablets by mouth daily.     Cyanocobalamin (VITAMIN B 12 PO) Take 1 tablet by mouth daily.     diltiazem (CARDIZEM CD) 180 MG 24 hr capsule Take 1 capsule (180 mg total) by mouth daily. 30 capsule 1   diltiazem (CARDIZEM) 30 MG tablet Take 1 tablet every 4 hours AS NEEDED for heart rate >100 as long as blood pressure >100. 45 tablet 1   Ferrous Sulfate (IRON PO) Take 1 tablet by mouth daily.     flecainide (TAMBOCOR) 100 MG tablet Take 1 tablet (100 mg total) by mouth 2 (two) times daily. 180 tablet 3   fluticasone (FLONASE) 50 MCG/ACT  nasal spray Place 1 spray into both nostrils every other day.     FOLIC ACID PO Take 1 tablet by mouth daily.     furosemide (LASIX) 40 MG tablet Take 1 tablet (40 mg total) by mouth daily. 90 tablet 3   Omega-3 Fatty Acids (FISH OIL PO) Take 1 tablet by mouth daily.     pantoprazole (PROTONIX) 40 MG tablet Take 1 tablet (40 mg total) by mouth 2 (two) times daily. (Patient taking differently: Take 40 mg by mouth daily.)     pravastatin (PRAVACHOL) 40 MG tablet TAKE 1 TABLET(40  MG) BY MOUTH AT BEDTIME (Patient taking differently: Take 40 mg by mouth daily.) 90 tablet 1   rivaroxaban (XARELTO) 20 MG TABS tablet Take 1 tablet (20 mg total) by mouth daily with supper. 90 tablet 3   triamterene-hydrochlorothiazide (MAXZIDE-25) 37.5-25 MG tablet Take 0.5 tablets by mouth daily. Please hold this medication, please discuss with your pcp and cardiology about resuming this meds     No current facility-administered medications for this visit.    Allergies  Allergen Reactions   Ibuprofen Other (See Comments)    Pt is on Xarelto   Lactose Intolerance (Gi) Diarrhea and Nausea And Vomiting   Lisinopril Cough   Nsaids Other (See Comments)    Pt on Xarelto   Tape     Causes blisters    Tolmetin Other (See Comments)    Pt on Xarelto   Zoster Vaccine Live Other (See Comments)    Serum sickness, high fever, neck pain    Amoxicillin Rash   Penicillins Rash    Local reaction    Review of Systems negative except from HPI and PMH (+) Shortness of Breath (+) Lightheadedness (+) LE Edema  Physical Exam: BP 124/68   Pulse 63   Ht '5\' 8"'$  (1.727 m)   Wt 234 lb 12.8 oz (106.5 kg)   SpO2 99%   BMI 35.70 kg/m   Well developed and nourished in no acute distress HENT normal Neck supple  Clear Device pocket well healed; without hematoma or erythema.  There is no tethering  Regular rate and rhythm, no murmurs or gallops Abd-soft with active BS No Clubbing cyanosis tr edema Skin-warm and dry A &  Oriented  Grossly normal sensory and motor function  ECG Apacing @ 63 30/16/46  RBBB    Assessment and  Plan  Atrial fibrillation-flutter persistent  CHADS-VASc score 2 (age/gender)    Obesity  RBBB/left axis deviation  PVCs  Sinus bradycardia/junctional rhythm  Interval atrial fibrillation in hospital Will reduce the dilt dose back to the preadmission of 120  Continue Apixaban  for anticoagulation   Diuresis seems to be hresolving on its own PVCs queiscent

## 2021-08-18 DIAGNOSIS — S72012D Unspecified intracapsular fracture of left femur, subsequent encounter for closed fracture with routine healing: Secondary | ICD-10-CM | POA: Diagnosis not present

## 2021-08-18 DIAGNOSIS — I5032 Chronic diastolic (congestive) heart failure: Secondary | ICD-10-CM | POA: Diagnosis not present

## 2021-08-18 DIAGNOSIS — I110 Hypertensive heart disease with heart failure: Secondary | ICD-10-CM | POA: Diagnosis not present

## 2021-08-18 DIAGNOSIS — I480 Paroxysmal atrial fibrillation: Secondary | ICD-10-CM | POA: Diagnosis not present

## 2021-08-18 DIAGNOSIS — Z96642 Presence of left artificial hip joint: Secondary | ICD-10-CM | POA: Diagnosis not present

## 2021-08-24 DIAGNOSIS — M25562 Pain in left knee: Secondary | ICD-10-CM | POA: Diagnosis not present

## 2021-08-24 DIAGNOSIS — G4733 Obstructive sleep apnea (adult) (pediatric): Secondary | ICD-10-CM | POA: Diagnosis not present

## 2021-08-29 DIAGNOSIS — I480 Paroxysmal atrial fibrillation: Secondary | ICD-10-CM | POA: Diagnosis not present

## 2021-08-29 DIAGNOSIS — I5032 Chronic diastolic (congestive) heart failure: Secondary | ICD-10-CM | POA: Diagnosis not present

## 2021-08-29 DIAGNOSIS — S72012D Unspecified intracapsular fracture of left femur, subsequent encounter for closed fracture with routine healing: Secondary | ICD-10-CM | POA: Diagnosis not present

## 2021-08-29 DIAGNOSIS — Z96642 Presence of left artificial hip joint: Secondary | ICD-10-CM | POA: Diagnosis not present

## 2021-08-29 DIAGNOSIS — I110 Hypertensive heart disease with heart failure: Secondary | ICD-10-CM | POA: Diagnosis not present

## 2021-09-02 ENCOUNTER — Telehealth (HOSPITAL_COMMUNITY): Payer: Self-pay | Admitting: *Deleted

## 2021-09-02 ENCOUNTER — Encounter: Payer: Self-pay | Admitting: Internal Medicine

## 2021-09-02 DIAGNOSIS — Z471 Aftercare following joint replacement surgery: Secondary | ICD-10-CM | POA: Diagnosis not present

## 2021-09-02 DIAGNOSIS — Z96642 Presence of left artificial hip joint: Secondary | ICD-10-CM | POA: Diagnosis not present

## 2021-09-02 MED ORDER — DILTIAZEM HCL ER COATED BEADS 180 MG PO CP24: 180.0000 mg | ORAL_CAPSULE | Freq: Every day | ORAL | 1 refills | Status: DC

## 2021-09-02 NOTE — Telephone Encounter (Signed)
Patient converted to Afib around 2am. She has taken 2 doses of PRN cardizem but heart rates continues from 90-120 BP 140/80. Discussed with Adline Peals PA pt to increase cardizem daily back to '180mg'$  once a day (recently decreased) can continue using PRN cardizem for elevated rates. If still in Afib next week will call in to be seen. Pt in agreement and ER precautions were reviewed with pt.

## 2021-09-02 NOTE — Telephone Encounter (Signed)
See separate telephone encounter.

## 2021-09-13 ENCOUNTER — Ambulatory Visit (INDEPENDENT_AMBULATORY_CARE_PROVIDER_SITE_OTHER): Payer: Medicare Other

## 2021-09-13 DIAGNOSIS — R001 Bradycardia, unspecified: Secondary | ICD-10-CM

## 2021-09-16 ENCOUNTER — Encounter: Payer: Self-pay | Admitting: Internal Medicine

## 2021-09-16 ENCOUNTER — Ambulatory Visit (INDEPENDENT_AMBULATORY_CARE_PROVIDER_SITE_OTHER): Payer: Medicare Other | Admitting: Internal Medicine

## 2021-09-16 VITALS — BP 140/60 | HR 89 | Temp 98.4°F | Ht 68.0 in | Wt 235.8 lb

## 2021-09-16 DIAGNOSIS — D509 Iron deficiency anemia, unspecified: Secondary | ICD-10-CM | POA: Diagnosis not present

## 2021-09-16 DIAGNOSIS — M25552 Pain in left hip: Secondary | ICD-10-CM

## 2021-09-16 LAB — CUP PACEART REMOTE DEVICE CHECK
Battery Remaining Longevity: 108 mo
Battery Remaining Percentage: 92 %
Battery Voltage: 3.02 V
Brady Statistic AP VP Percent: 16 %
Brady Statistic AP VS Percent: 82 %
Brady Statistic AS VP Percent: 1 %
Brady Statistic AS VS Percent: 1.8 %
Brady Statistic RA Percent Paced: 98 %
Brady Statistic RV Percent Paced: 16 %
Date Time Interrogation Session: 20230912020013
Implantable Lead Implant Date: 20220912
Implantable Lead Implant Date: 20220912
Implantable Lead Location: 753859
Implantable Lead Location: 753860
Implantable Lead Model: 3830
Implantable Pulse Generator Implant Date: 20220912
Lead Channel Impedance Value: 460 Ohm
Lead Channel Impedance Value: 640 Ohm
Lead Channel Pacing Threshold Amplitude: 0.75 V
Lead Channel Pacing Threshold Amplitude: 1.25 V
Lead Channel Pacing Threshold Pulse Width: 0.4 ms
Lead Channel Pacing Threshold Pulse Width: 0.4 ms
Lead Channel Sensing Intrinsic Amplitude: 1.7 mV
Lead Channel Sensing Intrinsic Amplitude: 12 mV
Lead Channel Setting Pacing Amplitude: 2 V
Lead Channel Setting Pacing Amplitude: 2.5 V
Lead Channel Setting Pacing Pulse Width: 0.4 ms
Lead Channel Setting Sensing Sensitivity: 2 mV
Pulse Gen Model: 2272
Pulse Gen Serial Number: 3956585

## 2021-09-16 MED ORDER — CYCLOBENZAPRINE HCL 10 MG PO TABS: 10.0000 mg | ORAL_TABLET | Freq: Every evening | ORAL | 1 refills | Status: DC | PRN

## 2021-09-16 MED ORDER — ACETAMINOPHEN-CODEINE 300-30 MG PO TABS: 1.0000 | ORAL_TABLET | Freq: Every evening | ORAL | 0 refills | Status: DC | PRN

## 2021-09-16 NOTE — Progress Notes (Unsigned)
Subjective:    Patient ID: Veronica Bradford, female    DOB: January 19, 1945, 76 y.o.   MRN: [id number]  DOS:  09/16/2021 Type of visit - description: f/u  Since the last office visit in general she feels much improved. The hip pain is better however she is having still issues with nocturnal pain mostly around the hip and back. She already talked with Ortho about it and they felt that this was related to the recent surgery and prescribed few hydrocodones. Likes to discuss further steps with me.  The main problems is she cannot sleep due to pain.  Denies nausea vomiting.  No blood in the stools.  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 Caryl Comes, APAP    Past Surgical History:  Procedure Laterality Date   ABDOMINAL HYSTERECTOMY     ATRIAL FIBRILLATION ABLATION N/A 09/04/2017   Procedure: [place];  Surgeon: [name], MD;  Location: [facility];  Service: Cardiovascular;  Laterality: N/A;   BIOPSY  03/11/2020   Procedure: BIOPSY;  Surgeon: [name], MD;  [facility]: [facility];  Service: Endoscopy;;  EGD and COLON   CARPAL TUNNEL RELEASE Right 06/17/2019   CARPAL TUNNEL RELEASE Left 11/2020   CHOLECYSTECTOMY  ~2006   COLONOSCOPY WITH PROPOFOL  N/A 03/11/2020   Procedure: COLONOSCOPY WITH PROPOFOL;  Surgeon: [name], MD;  [facility]: [facility];  Service: Endoscopy;  Laterality: N/A;   DILATION AND CURETTAGE OF UTERUS     after SAB   ESOPHAGOGASTRODUODENOSCOPY  02/09/2014   Dr. 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/2020   Procedure: PACEMAKER IMPLANT;  Surgeon: [name], MD;  Location: [facility];  Service: Cardiovascular;  Laterality: N/A;   TONSILLECTOMY     TOTAL ABDOMINAL HYSTERECTOMY W/ BILATERAL SALPINGOOPHORECTOMY     TOTAL HIP ARTHROPLASTY Left 07/18/2021   Procedure: TOTAL HIP ARTHROPLASTY ANTERIOR APPROACH;  Surgeon: Rod Can, MD;  Location: [facility];  Service: Orthopedics;  Laterality: Left;   TOTAL KNEE ARTHROPLASTY Right ~2008    Current Outpatient Medications  Medication Instructions   acetaminophen (TYLENOL) 500 mg, Oral, Every 6 hours PRN   ARTIFICIAL TEAR SOLUTION OP 1 drop, Both Eyes, 4 times daily   cholecalciferol (VITAMIN D3) 1,000 Units, Oral, Daily   CRANBERRY EXTRACT PO 2 tablets,  Oral, Daily   Cyanocobalamin (VITAMIN B 12 PO) 1 tablet, Oral, Daily   diltiazem (CARDIZEM CD) 180 mg, Oral, Daily   diltiazem (CARDIZEM) 30 MG tablet Take 1 tablet every 4 hours AS NEEDED for heart rate >100 as long as blood pressure >100.   Ferrous Sulfate (IRON PO) 1 tablet, Daily   flecainide (TAMBOCOR) 100 mg, Oral, 2 times daily   fluticasone (FLONASE) 50 MCG/ACT nasal spray 1 spray, Each Nare, Every other day   FOLIC ACID PO 1 tablet, Oral, Daily   furosemide (LASIX) 40 mg, Oral, Daily   Omega-3 Fatty Acids (FISH OIL PO) 1 tablet, Daily   pantoprazole (PROTONIX) 40 mg, Oral, 2 times daily    pravastatin (PRAVACHOL) 40 MG tablet TAKE 1 TABLET(40 MG) BY MOUTH AT BEDTIME   rivaroxaban (XARELTO) 20 mg, Oral, Daily with supper   triamterene-hydrochlorothiazide (MAXZIDE-25) 37.5-25 MG tablet 0.5 tablets, Oral, Daily, Please hold this medication, please discuss with your pcp and cardiology about resuming this meds       Objective:   Physical Exam BP (!) 140/60   Pulse 89   Temp 98.4 F (36.9 C) (Oral)   Ht '5\' 8"'$  (1.727 m)   Wt 235 lb 12.8 oz (107 kg)   SpO2 98%   BMI 35.85 kg/m  General:   Well developed, NAD, BMI noted. HEENT:  Normocephalic . Face symmetric, atraumatic Lungs:  CTA B Normal respiratory effort, no intercostal retractions, no accessory muscle use. Heart: RRR,  no murmur.  Lower extremities: Symmetric trace  pretibial edema bilaterally  Skin: Not pale. Not jaundice Neurologic:  alert & oriented X3.  Speech normal, gait assisted by cane  psych--  Cognition and judgment appear intact.  Cooperative with normal attention span and concentration.  Behavior appropriate. No anxious or depressed appearing.      Assessment     Assessment: Prediabetes HTN Hyperlipidemia Chronic lower extremity edema L>R (eval by previous pcp, related to varicose veins?) CV:  -- P. Atrial fibrillation, A. flutter: on xarelto flecainide-diltiazem  prn --Ablation 09/2017 --Pacemaker 09-2020 GI: Dr [name]  --GERD (zantac or protonix prn), IBS, chronic diarrhea --GI records: [place], 2004, 2009 8 and 07-2013. Negative for polyps or IBD. + Diverticuli. EGD/cscope 11/2016; EGD/Cscope 03/11/2020   --Increased LFTs: CT abdomen 05/2016: Normal liver. 05/2017: WNL  Hepatitis B and C, Alpha 1 antitrypsin, anti-smooth muscle antibody, ANA trans-ferritin  OSA--- CPAP, Dr. Maxwell Caul Osteopenia:  Multiple  Dexas, last few:  T score 2009  -1.1; T score 2015  -0.7; T score 05/2017 -1.8 DJD --Chronic back pain-- tylenol #3 prn --- RF per PCP --history of epidurals in the past, MRI  2014: Scoliosis,DJD --CIPRO pre dental d/t knee replacement (rx by ortho) H/o endometrial cancer, found during a hysterectomy, no chemotherapy or XRT.  Primary hyperparathyroidism: Chronic hypercalcemia, increased PTH 05/2016, refered to ENT  PLAN:  F/u from LOV L hip arthroplasty: Overall improving.  Has developed back pain, she already d/w Ortho, was prescribed few hydrocodones that helped. Pain is preventing a good night sleep, request management. We agreed on Tylenol  No. 3 at bedtime (works better than hydrocodone for her), and also a muscle relaxant, Flexeril sent. Watch for excessive drowsiness. Cellulitis, L leg: See LOV, much improved. Atrial fibrillation: Saw cardiology August 16, 2021, diltiazem dose adjusted  UTI: On chart review, she has seen urology at least twice recently. H/O Iron deficiency: Patient has been taking iron regularly except for the last few days, would like labs, we will get  a CBC, iron, ferritin.  She is anticoagulated but denies any GI symptoms at this time. Preventive care: Vaccines advised.  See AVS RTC 3 months

## 2021-09-16 NOTE — Patient Instructions (Addendum)
Vaccines I recommend: Flu shot COVID booster RSV   GO TO THE LAB : Get the blood work     [place], [place] back for   a checkup in 3 months

## 2021-09-17 LAB — CBC WITH DIFFERENTIAL/PLATELET
Absolute Monocytes: 598 cells/uL (ref 200–950)
Basophils Absolute: 52 cells/uL (ref 0–200)
Basophils Relative: 0.8 %
Eosinophils Absolute: 130 cells/uL (ref 15–500)
Eosinophils Relative: 2 %
HCT: 36.9 % (ref 35.0–45.0)
Hemoglobin: 12.3 g/dL (ref 11.7–15.5)
Lymphs Abs: 1482 cells/uL (ref 850–3900)
MCH: 29.6 pg (ref 27.0–33.0)
MCHC: 33.3 g/dL (ref 32.0–36.0)
MCV: 88.9 fL (ref 80.0–100.0)
MPV: 11.6 fL (ref 7.5–12.5)
Monocytes Relative: 9.2 %
Neutro Abs: 4238 cells/uL (ref 1500–7800)
Neutrophils Relative %: 65.2 %
Platelets: 239 10*3/uL (ref 140–400)
RBC: 4.15 10*6/uL (ref 3.80–5.10)
RDW: 12.7 % (ref 11.0–15.0)
Total Lymphocyte: 22.8 %
WBC: 6.5 10*3/uL (ref 3.8–10.8)

## 2021-09-17 LAB — FERRITIN: Ferritin: 20 ng/mL (ref 16–288)

## 2021-09-17 LAB — IRON: Iron: 22 ug/dL — ABNORMAL LOW (ref 45–160)

## 2021-09-18 NOTE — Assessment & Plan Note (Signed)
F/u from LOV L hip arthroplasty: Overall improving.  Has developed back pain, she already d/w Ortho, was prescribed few hydrocodones that helped. Pain is preventing a good night sleep, request management. We agreed on Tylenol  No. 3 at bedtime (works better than hydrocodone for her), and also a muscle relaxant, Flexeril sent. Watch for excessive drowsiness. Cellulitis, L leg: See LOV, much improved. Atrial fibrillation: Saw cardiology August 16, 2021, diltiazem dose adjusted  UTI: On chart review, she has seen urology at least twice recently. H/O Iron deficiency: Patient has been taking iron regularly except for the last few days, would like labs, we will get a CBC, iron, ferritin.  She is anticoagulated but denies any GI symptoms at this time. Preventive care: Vaccines advised.  See AVS RTC 3 months

## 2021-09-22 DIAGNOSIS — I8393 Asymptomatic varicose veins of bilateral lower extremities: Secondary | ICD-10-CM | POA: Diagnosis not present

## 2021-09-22 DIAGNOSIS — D1801 Hemangioma of skin and subcutaneous tissue: Secondary | ICD-10-CM | POA: Diagnosis not present

## 2021-09-22 DIAGNOSIS — L814 Other melanin hyperpigmentation: Secondary | ICD-10-CM | POA: Diagnosis not present

## 2021-09-22 DIAGNOSIS — L821 Other seborrheic keratosis: Secondary | ICD-10-CM | POA: Diagnosis not present

## 2021-09-22 DIAGNOSIS — L853 Xerosis cutis: Secondary | ICD-10-CM | POA: Diagnosis not present

## 2021-09-22 DIAGNOSIS — Z1283 Encounter for screening for malignant neoplasm of skin: Secondary | ICD-10-CM | POA: Diagnosis not present

## 2021-09-22 DIAGNOSIS — L578 Other skin changes due to chronic exposure to nonionizing radiation: Secondary | ICD-10-CM | POA: Diagnosis not present

## 2021-09-24 DIAGNOSIS — G4733 Obstructive sleep apnea (adult) (pediatric): Secondary | ICD-10-CM | POA: Diagnosis not present

## 2021-09-29 NOTE — Progress Notes (Signed)
Remote pacemaker transmission.   

## 2021-10-07 DIAGNOSIS — M25562 Pain in left knee: Secondary | ICD-10-CM | POA: Diagnosis not present

## 2021-10-17 ENCOUNTER — Ambulatory Visit: Payer: Medicare Other | Admitting: Internal Medicine

## 2021-10-17 VITALS — BP 148/64 | HR 62 | Temp 98.5°F | Ht 67.0 in | Wt 234.4 lb

## 2021-10-17 DIAGNOSIS — M546 Pain in thoracic spine: Secondary | ICD-10-CM | POA: Diagnosis not present

## 2021-10-17 NOTE — Progress Notes (Unsigned)
Subjective:    Patient ID: Veronica Bradford, female    DOB: 13-Mar-1945, 76 y.o.   MRN: [id number]  DOS:  10/17/2021 Type of visit - description: Acute  The patient developed thoracic back pain approximately 6 weeks ago, located on the right side from the spine. The pain is acutely worse when she lays down or with certain movements. Pain is 1/10 when she is resting on her bed. No fall or injury No radiation to the sides, some radiation to the front. This is different from her chronic, low back pain. She has a muscle relaxant but she never tried, she was afraid of side effects.  She has not reach out to Tylenol #3 because the pain is mild.  Also approximately 5 days ago developed a RUQ abdominal pain, located "just below the ribs". The pain is fleeting and only yesterday she felt it  was exacerbated by eating. No nausea or vomiting.  No diarrhea or blood in the stools.  No NSAIDs. No cough or difficulty breathing No gross hematuria  No rash anywhere  On chronic PPIs, lately is having more heartburn, self increase PPIs to twice daily temporarily   Review of Systems See above   Past Medical History:  Diagnosis Date   Atrial fibrillation ([facility])    Back pain    Chronic sinusitis    s/p 2 surgeries remotely, [name]   Closed fracture of metatarsal of left foot    L foot, fifth metatarsal   Ectopic pregnancy    Endometrial cancer ([name]) 2004   Early diagnosis   GERD (gastroesophageal reflux disease)    Hyperlipemia    Hypertension    IBS (irritable bowel syndrome) 08/13/2014   Dx 2015, diarrhea on-off, Dr [name]    Lumbar radiculopathy 08/2012   MRI in 08/2012   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 Caryl Comes, APAP    Past Surgical History:  Procedure Laterality Date   ATRIAL  FIBRILLATION ABLATION N/A 09/04/2017   Procedure: [place];  Surgeon: [name], MD;  Location: [facility];  Service: Cardiovascular;  Laterality: N/A;   BIOPSY  03/11/2020   Procedure: BIOPSY;  Surgeon: [name], MD;  [facility];  Service: Endoscopy;;  EGD and COLON   CARPAL TUNNEL RELEASE Right 06/17/2019   CARPAL TUNNEL RELEASE Left 11/2020   CHOLECYSTECTOMY  ~2006   COLONOSCOPY WITH PROPOFOL N/A 03/11/2020   Procedure: COLONOSCOPY WITH PROPOFOL;  Surgeon: [name], MD;  [facility];  Service: Endoscopy;  Laterality: N/A;   DILATION AND CURETTAGE OF UTERUS     after SAB   ESOPHAGOGASTRODUODENOSCOPY  02/09/2014   Dr. 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/2020   Procedure: PACEMAKER IMPLANT;  Surgeon: [name], MD;  Location: [facility];  Service: Cardiovascular;  Laterality: N/A;   TONSILLECTOMY     TOTAL ABDOMINAL HYSTERECTOMY W/ BILATERAL SALPINGOOPHORECTOMY     TOTAL HIP ARTHROPLASTY  Left 07/18/2021   Procedure: TOTAL HIP ARTHROPLASTY ANTERIOR APPROACH;  Surgeon: Rod Can, MD;  [facility];  Service: Orthopedics;  Laterality: Left;   TOTAL KNEE ARTHROPLASTY Right ~2008    Current Outpatient Medications  Medication Instructions   acetaminophen (TYLENOL) 500 mg, Oral, Every 6 hours PRN   acetaminophen-codeine (TYLENOL #3) 300-30 MG tablet 1 tablet, Oral, At bedtime PRN   ARTIFICIAL TEAR SOLUTION OP 1 drop, Both Eyes, 4 times daily   cholecalciferol (VITAMIN D3) 1,000 Units, Oral, Daily   CRANBERRY EXTRACT PO 2 tablets, Oral, Daily   Cyanocobalamin (VITAMIN B 12 PO) 1 tablet, Oral, Daily    cyclobenzaprine (FLEXERIL) 10 mg, Oral, At bedtime PRN   diltiazem (CARDIZEM CD) 120 mg, Oral, Daily   diltiazem (CARDIZEM) 30 MG tablet Take 1 tablet every 4 hours AS NEEDED for heart rate >100 as long as blood pressure >100.   Ferrous Sulfate (IRON PO) 1 tablet, Oral, Daily   flecainide (TAMBOCOR) 100 mg, Oral, 2 times daily   fluticasone (FLONASE) 50 MCG/ACT nasal spray 1 spray, Each Nare, Every other day   FOLIC ACID PO 1 tablet, Oral, Daily   furosemide (LASIX) 40 mg, Oral, Daily   Omega-3 Fatty Acids (FISH OIL PO) 1 tablet, Oral, Daily   pantoprazole (PROTONIX) 40 mg, Oral, 2 times daily   pravastatin (PRAVACHOL) 40 MG tablet TAKE 1 TABLET(40 MG) BY MOUTH AT BEDTIME   rivaroxaban (XARELTO) 20 mg, Oral, Daily with supper   triamterene-hydrochlorothiazide (MAXZIDE-25) 37.5-25 MG tablet 0.5 tablets, Oral, Daily, Please hold this medication, please discuss with your pcp and cardiology about resuming this meds       Objective:   Physical Exam BP (!) 148/64   Pulse 62   Temp 98.5 F (36.9 C) (Oral)   Ht '5\' 7"'$  (1.702 m)   Wt 234 lb 6.4 oz (106.3 kg)   SpO2 98%   BMI 36.71 kg/m  General:   Well developed, NAD, BMI noted.  HEENT:  Normocephalic . Face symmetric, atraumatic Lungs:  CTA B Normal respiratory effort, no intercostal retractions, no accessory muscle use. Heart: RRR,  no murmur.  Abdomen:  Not distended, soft, non-tender. No rebound or rigidity.   Skin: Not pale. Not jaundice Lower extremities: no pretibial edema bilaterally  Neurologic:  alert & oriented X3.  Speech normal, gait appropriate for age and unassisted Psych--  Cognition and judgment appear intact.  Cooperative with normal attention span and concentration.  Behavior appropriate. No anxious or depressed appearing.     Assessment     Assessment: Prediabetes HTN Hyperlipidemia Chronic lower extremity edema L>R (eval by previous pcp, related to varicose veins?) CV:  -- P. Atrial fibrillation, A.  flutter: on xarelto flecainide-diltiazem  prn --Ablation 09/2017 --Pacemaker 09-2020 GI: Dr [name]  --GERD (zantac or protonix prn), IBS, chronic diarrhea --GI records: [place], 2004, 2009 8 and 07-2013. Negative for polyps or IBD. + Diverticuli. EGD/cscope 11/2016; EGD/Cscope 03/11/2020   --Increased LFTs: CT abdomen 05/2016: Normal liver. 05/2017: WNL  Hepatitis B and C, Alpha 1 antitrypsin, anti-smooth muscle antibody, ANA trans-ferritin  OSA--- CPAP, Dr. Maxwell Caul Osteopenia:  Multiple  Dexas, last few:  T score 2009  -1.1; T score 2015  -0.7; T score 05/2017 -1.8 DJD --Chronic back pain-- tylenol #3 prn --- RF per PCP --history of epidurals in the past, MRI 2014: Scoliosis,DJD --CIPRO pre dental d/t knee replacement (rx by ortho) H/o endometrial cancer, found during a hysterectomy, no chemotherapy or XRT.  Primary hyperparathyroidism: Chronic hypercalcemia,  increased PTH 05/2016, refered to ENT  PLAN:  R thoracic pain, RUQ abdominal pain: New. The patient described pain in 2 locations, I think they are the same issue. It is a slightly difficult to tease out the etiology.  No GI symptoms other than a increase in heartburn. History of cholecystectomy. She is anticoagulated however if this is a complication from xarelto (retroperitoneal bleeding) I would expect a much more dramatic presentation. No rash Plan: Check a CBC, if a significant drop in hemoglobin consider CT abdomen and pelvis Check a UA Otherwise observation, Tylenol, heating pad, call if severe symptoms, see AVS. Call if not gradually better GERD: Having more heartburn here lately, unclear is related to above problem, has self increase PPIs to twice daily which I agree with.

## 2021-10-17 NOTE — Patient Instructions (Signed)
Provide blood and urine sample  Continue with Tylenol  Okay to take Tylenol # 3 from time to time  Call anytime or go to the ER if Fever chills Severe pain You develop a  rash You see blood in the urine  Call in 7 to 10 days if not gradually better

## 2021-10-18 LAB — CBC WITH DIFFERENTIAL/PLATELET
Basophils Absolute: 0.1 10*3/uL (ref 0.0–0.1)
Basophils Relative: 0.9 % (ref 0.0–3.0)
Eosinophils Absolute: 0.1 10*3/uL (ref 0.0–0.7)
Eosinophils Relative: 1.6 % (ref 0.0–5.0)
HCT: 39.6 % (ref 36.0–46.0)
Hemoglobin: 13 g/dL (ref 12.0–15.0)
Lymphocytes Relative: 15.6 % (ref 12.0–46.0)
Lymphs Abs: 1.3 10*3/uL (ref 0.7–4.0)
MCHC: 33 g/dL (ref 30.0–36.0)
MCV: 88 fl (ref 78.0–100.0)
Monocytes Absolute: 0.8 10*3/uL (ref 0.1–1.0)
Monocytes Relative: 9.6 % (ref 3.0–12.0)
Neutro Abs: 6.1 10*3/uL (ref 1.4–7.7)
Neutrophils Relative %: 72.3 % (ref 43.0–77.0)
Platelets: 234 10*3/uL (ref 150.0–400.0)
RBC: 4.5 Mil/uL (ref 3.87–5.11)
RDW: 15.1 % (ref 11.5–15.5)
WBC: 8.4 10*3/uL (ref 4.0–10.5)

## 2021-10-18 LAB — URINALYSIS, ROUTINE W REFLEX MICROSCOPIC
Bilirubin Urine: NEGATIVE
Hgb urine dipstick: NEGATIVE
Ketones, ur: NEGATIVE
Leukocytes,Ua: NEGATIVE
Nitrite: NEGATIVE
RBC / HPF: NONE SEEN (ref 0–?)
Specific Gravity, Urine: <=1.005 — AB (ref 1.000–1.030)
Total Protein, Urine: NEGATIVE
Urine Glucose: NEGATIVE
Urobilinogen, UA: 0.2 (ref 0.0–1.0)
WBC, UA: NONE SEEN (ref 0–?)
pH: 7 (ref 5.0–8.0)

## 2021-10-18 NOTE — Assessment & Plan Note (Addendum)
R thoracic pain, RUQ abdominal pain: New. The patient described pain in 2 locations, I think they are the same issue. It is a slightly difficult to tease out the etiology.  No GI symptoms other than a increase in heartburn. History of cholecystectomy. She is anticoagulated however if this is a complication from xarelto (retroperitoneal bleeding) I would expect a much more dramatic presentation. No rash Plan: Check a CBC, if a significant drop in hemoglobin consider CT abdomen and pelvis Check a UA Otherwise observation, Tylenol, heating pad, call if severe symptoms, see AVS. Call if not gradually better GERD: Having more heartburn here lately, unclear is related to above problem, has self increase PPIs to twice daily which I agree with.

## 2021-10-20 DIAGNOSIS — G4733 Obstructive sleep apnea (adult) (pediatric): Secondary | ICD-10-CM | POA: Diagnosis not present

## 2021-10-24 DIAGNOSIS — G4733 Obstructive sleep apnea (adult) (pediatric): Secondary | ICD-10-CM | POA: Diagnosis not present

## 2021-10-25 ENCOUNTER — Encounter: Payer: Self-pay | Admitting: Internal Medicine

## 2021-10-26 ENCOUNTER — Other Ambulatory Visit: Payer: Self-pay

## 2021-10-26 ENCOUNTER — Encounter (HOSPITAL_COMMUNITY): Payer: Self-pay | Admitting: Emergency Medicine

## 2021-10-26 ENCOUNTER — Emergency Department (HOSPITAL_COMMUNITY): Payer: Medicare Other

## 2021-10-26 ENCOUNTER — Emergency Department (HOSPITAL_COMMUNITY)
Admission: EM | Admit: 2021-10-26 | Discharge: 2021-10-26 | Discharge status: Against Medical Advice | Payer: Medicare Other | Attending: Nurse Practitioner | Admitting: Nurse Practitioner

## 2021-10-26 ENCOUNTER — Ambulatory Visit (HOSPITAL_BASED_OUTPATIENT_CLINIC_OR_DEPARTMENT_OTHER)
Admit: 2021-10-26 | Discharge: 2021-10-26 | Disposition: A | Discharge status: Home / Self Care | Payer: Medicare Other | Attending: Nurse Practitioner | Admitting: Nurse Practitioner

## 2021-10-26 ENCOUNTER — Ambulatory Visit (HOSPITAL_COMMUNITY): Payer: Medicare Other | Admitting: Nurse Practitioner

## 2021-10-26 ENCOUNTER — Encounter (HOSPITAL_COMMUNITY): Payer: Self-pay | Admitting: Nurse Practitioner

## 2021-10-26 ENCOUNTER — Other Ambulatory Visit (HOSPITAL_COMMUNITY): Payer: Self-pay | Admitting: Nurse Practitioner

## 2021-10-26 VITALS — BP 136/76 | HR 79 | Ht 66.0 in | Wt 231.6 lb

## 2021-10-26 DIAGNOSIS — R0789 Other chest pain: Secondary | ICD-10-CM | POA: Diagnosis present

## 2021-10-26 DIAGNOSIS — D6869 Other thrombophilia: Secondary | ICD-10-CM

## 2021-10-26 DIAGNOSIS — Z5321 Procedure and treatment not carried out due to patient leaving prior to being seen by health care provider: Secondary | ICD-10-CM | POA: Diagnosis not present

## 2021-10-26 DIAGNOSIS — I4891 Unspecified atrial fibrillation: Secondary | ICD-10-CM | POA: Diagnosis not present

## 2021-10-26 DIAGNOSIS — J9811 Atelectasis: Secondary | ICD-10-CM | POA: Diagnosis not present

## 2021-10-26 DIAGNOSIS — Z743 Need for continuous supervision: Secondary | ICD-10-CM | POA: Diagnosis not present

## 2021-10-26 DIAGNOSIS — I499 Cardiac arrhythmia, unspecified: Secondary | ICD-10-CM | POA: Diagnosis not present

## 2021-10-26 DIAGNOSIS — R6889 Other general symptoms and signs: Secondary | ICD-10-CM | POA: Diagnosis not present

## 2021-10-26 DIAGNOSIS — I480 Paroxysmal atrial fibrillation: Secondary | ICD-10-CM | POA: Diagnosis not present

## 2021-10-26 DIAGNOSIS — I10 Essential (primary) hypertension: Secondary | ICD-10-CM | POA: Diagnosis not present

## 2021-10-26 LAB — CBC
HCT: 47.5 % — ABNORMAL HIGH (ref 36.0–46.0)
Hemoglobin: 15.2 g/dL — ABNORMAL HIGH (ref 12.0–15.0)
MCH: 28.6 pg (ref 26.0–34.0)
MCHC: 32 g/dL (ref 30.0–36.0)
MCV: 89.3 fL (ref 80.0–100.0)
Platelets: 299 10*3/uL (ref 150–400)
RBC: 5.32 MIL/uL — ABNORMAL HIGH (ref 3.87–5.11)
RDW: 14.8 % (ref 11.5–15.5)
WBC: 6.8 10*3/uL (ref 4.0–10.5)
nRBC: 0 % (ref 0.0–0.2)

## 2021-10-26 LAB — BASIC METABOLIC PANEL
Anion gap: 12 (ref 5–15)
BUN: 11 mg/dL (ref 8–23)
CO2: 27 mmol/L (ref 22–32)
Calcium: 11 mg/dL — ABNORMAL HIGH (ref 8.9–10.3)
Chloride: 104 mmol/L (ref 98–111)
Creatinine, Ser: 0.68 mg/dL (ref 0.44–1.00)
GFR, Estimated: >60 mL/min (ref >60)
Glucose, Bld: 119 mg/dL — ABNORMAL HIGH (ref 70–99)
Potassium: 3.2 mmol/L — ABNORMAL LOW (ref 3.5–5.1)
Sodium: 143 mmol/L (ref 135–145)

## 2021-10-26 MED ORDER — POTASSIUM CHLORIDE CRYS ER 20 MEQ PO TBCR: EXTENDED_RELEASE_TABLET | ORAL | 0 refills | Status: DC

## 2021-10-26 NOTE — Patient Instructions (Addendum)
Start Klor-Con- Potassium 76mq - Taking 1 tablet by mouth x 3 days Only Increase dose of Diltiazem '120mg'$  - Taking 1 tablet by mouth twice daily Morning of Cardioversion go back to usual dose of Diltiazem [facility] # [phone] If you will as if you go back into normal sinus rhythm      Cardioversion scheduled for Oct 31st 2023  [facility]  - Do not eat or drink anything after midnight the night prior to your procedure.  - Take all your morning medication (except diabetic medications) with a sip of water prior to arrival.  - You will not be able to drive home after your procedure.  - Do NOT miss any doses of your blood thinner - if you should miss a dose please notify our office immediately.  - If you feel as if you go back into normal rhythm prior to scheduled cardioversion, please notify our office immediately. If your procedure is canceled in the cardioversion suite you will be charged a cancellation fee.

## 2021-10-26 NOTE — Progress Notes (Addendum)
Primary Care Physician: Colon Branch, MD Referring Physician: Dr. Rayann Heman EP: Dr. Ludger Nutting is a 76 y.o. female with a h/o PPM, afib and PVC's that is in the afib clinic as she was in the ED waiting room and was not going to be seen by the provider for several hours so decided to leave  to be seen here this am for BP issues and afib.   She reports that she was in her usual state of health until last Friday when she had a tooth pulled. She had to hold her xarelto Thursday pm.  She is on xarelto 20 mg daily for a CHA2DS2VASc  score of at least 3.  She was started on clindamycin after the tooth extraction. Her xarelto was started back Friday pm. Her oral intake has been off since the tooth extraction. She was walking the dog Monday pm around 3:30 pm and noted that she felt off. She checked her heart rhythm and it was irregular with rates in the 80's. She described some chest heaviness on arrival to the ED to triage nurse but told me it has now resolved. Ekg in the ED showed afib with RBBB at 84 bpm.   Usually has  heart rates around 60's. She took extra cardizem and her heart rates for the most part have been less than 100 bpm. This am around 6:30 she awoke and took her BP. It was around 240/122. She rechecked with another BP cuff with similar results. She took her usual  am meds and called 911. By the time EMS responded her BP was 741 then 638 systolic and had normalized on arrival to the ER. She then got tired of the wait and asked to be seen this am as she had already made an appointment today for 9:30 a to discuss ongoing afib so checked herself out of the ED.labs form ED showed a k+ of 3.2 otherwise ok. HR irregular in the afib clinic in the 70's. Ekg was not repeated from  ED.   Today, she + for symptoms of palpitations, no current  chest pain, shortness of breath, orthopnea, PND, lower extremity edema, dizziness, presyncope, syncope, or neurologic sequela. The patient is tolerating  medications without difficulties and is otherwise without complaint today.   Past Medical History:  Diagnosis Date   Atrial fibrillation [facility])    Atrial fibrillation ([place])    Back pain    Chronic sinusitis    s/p 2 surgeries remotely, [name]   Closed fracture of metatarsal of left foot    L foot, fifth metatarsal   Ectopic pregnancy    Endometrial cancer ([place]) 2004   Early diagnosis   GERD (gastroesophageal reflux disease)    Hyperlipemia    Hypertension    IBS (irritable bowel syndrome) 08/13/2014   Dx 2015, diarrhea on-off, Dr [name]    Lumbar radiculopathy 08/2012   MRI in 08/2012   Mild hyperparathyroidism ([place])    Osteopenia    PAF (paroxysmal atrial fibrillation) ([facility])    Prediabetes    Sciatica of right side 08/2013   Received Depomedrol 80 mg injection   Sleep apnea 09/09/2013   Dx with OSA in 2015, by Dr. Caryl Comes, APAP   Past Surgical History:  Procedure Laterality Date   ATRIAL FIBRILLATION ABLATION N/A 09/04/2017   Procedure: [place];  Surgeon: [name], MD;  Location: [facility];  Service: Cardiovascular;  Laterality: N/A;   BIOPSY  03/11/2020  Procedure: BIOPSY;  Surgeon: [name], MD;  Location: [facility];  Service: Endoscopy;;  EGD and COLON   CARPAL TUNNEL RELEASE Right 06/17/2019   CARPAL TUNNEL RELEASE Left 11/2020   CHOLECYSTECTOMY  ~2006   COLONOSCOPY WITH PROPOFOL N/A 03/11/2020   Procedure: COLONOSCOPY WITH PROPOFOL;  Surgeon: [name], MD;  Location: [facility];  Service: Endoscopy;  Laterality: N/A;   DILATION AND CURETTAGE OF UTERUS     after SAB   ESOPHAGOGASTRODUODENOSCOPY  02/09/2014   Dr. [name]   ESOPHAGOGASTRODUODENOSCOPY (EGD) WITH PROPOFOL N/A 03/11/2020   Procedure: ESOPHAGOGASTRODUODENOSCOPY (EGD) WITH PROPOFOL;  Surgeon: [name], MD;  Location: [facility];  Service: Endoscopy;  Laterality: N/A;   FINGER SURGERY Left    index   NASAL SINUS SURGERY     x 2 remotely, Dr  Lucia Gaskins   PACEMAKER IMPLANT N/A 09/13/2020   Procedure: PACEMAKER IMPLANT;  Surgeon: [name], MD;  Location: [facility];  Service: Cardiovascular;  Laterality: N/A;   TONSILLECTOMY     TOTAL ABDOMINAL HYSTERECTOMY W/ BILATERAL SALPINGOOPHORECTOMY     TOTAL HIP ARTHROPLASTY Left 07/18/2021   Procedure: TOTAL HIP ARTHROPLASTY ANTERIOR APPROACH;  Surgeon: Rod Can, MD;  Location: [facility];  Service: Orthopedics;  Laterality: Left;   TOTAL KNEE ARTHROPLASTY Right ~2008    Current Outpatient Medications  Medication Sig Dispense Refill   acetaminophen (TYLENOL) 500 MG tablet Take 500 mg by mouth every 6 (six) hours as needed for moderate pain or headache.     acetaminophen-codeine (TYLENOL #3) 300-30 MG tablet Take 1 tablet by mouth at bedtime as needed for moderate pain. 30 tablet 0   ARTIFICIAL TEAR SOLUTION OP Place 1 drop into both eyes 4 (four) times daily.     cholecalciferol (VITAMIN D3) 25 MCG (1000 UNIT) tablet Take 1,000 Units by mouth daily.     Chromium Picolinate 1000 MCG TABS Take 1 tablet by mouth every morning.     clindamycin (CLEOCIN) 150 MG capsule Take 150 mg by mouth 4 (four) times daily.     CRANBERRY EXTRACT PO Take 2 tablets by mouth daily.     Cyanocobalamin (VITAMIN B 12 PO) Take 1 tablet by mouth daily.     diltiazem (CARDIZEM CD) 120 MG 24 hr capsule Take 120 mg by mouth in the morning and at bedtime.     diltiazem (CARDIZEM) 30 MG tablet Take 1 tablet every 4 hours AS NEEDED for heart rate >100 as long as blood pressure >100. 45 tablet 1   estradiol (ESTRACE) 0.1 MG/GM vaginal cream Place 1 Applicatorful vaginally as needed.     Ferrous Sulfate (IRON PO) Take 1 tablet by mouth daily.     ferrous sulfate 325 (65 FE) MG EC tablet 1 tablet     flecainide (TAMBOCOR) 100 MG tablet Take 1 tablet (100 mg total) by mouth 2 (two) times daily. 180 tablet 3   fluticasone (FLONASE) 50 MCG/ACT nasal spray Place 1 spray into both nostrils every other day.      FOLIC ACID PO Take 1 tablet by mouth daily.     furosemide (LASIX) 40 MG tablet Take 1 tablet (40 mg total) by mouth daily. 90 tablet 3   Omega-3 Fatty Acids (FISH OIL PO) Take 1 tablet by mouth daily.     pantoprazole (PROTONIX) 40 MG tablet Take 1 tablet (40 mg total) by mouth 2 (two) times daily.     potassium chloride SA (KLOR-CON M20) 20 MEQ tablet Take one tablet by mouth  for only 3 days 3 tablet 0   pravastatin (PRAVACHOL) 40 MG tablet TAKE 1 TABLET(40 MG) BY MOUTH AT BEDTIME (Patient taking differently: Take 40 mg by mouth daily.) 90 tablet 1   psyllium (REGULOID) 0.52 g capsule 2 capsules with 8 ounces of liquid     rivaroxaban (XARELTO) 20 MG TABS tablet Take 1 tablet (20 mg total) by mouth daily with supper. 90 tablet 3   triamterene-hydrochlorothiazide (MAXZIDE-25) 37.5-25 MG tablet Take 0.5 tablets by mouth daily. Please hold this medication, please discuss with your pcp and cardiology about resuming this meds     No current facility-administered medications for this encounter.    Allergies  Allergen Reactions   Ibuprofen Other (See Comments)    Pt is on Xarelto   Lactose Intolerance (Gi) Diarrhea and Nausea And Vomiting   Lisinopril Cough   Nsaids Other (See Comments)    Pt on Xarelto   Tape     Causes blisters    Tolmetin Other (See Comments)    Pt on Xarelto   Zoster Vaccine Live Other (See Comments)    Serum sickness, high fever, neck pain    Amoxicillin Rash   Penicillins Rash    Local reaction    Social History   Socioeconomic History   Marital status: Married    Spouse name: Not on file   Number of children: 1   Years of education: Not on file   Highest education level: Not on file  Occupational History   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  Tobacco Use   Smoking status: Former    Types: Cigarettes    Quit date: 01/06/1980    Years since quitting: 41.8   Smokeless tobacco: Never   Tobacco comments:    smoked from [place] to 1982, less than 1 ppd  Vaping  Use   Vaping Use: Never used  Substance and Sexual Activity   Alcohol use: Yes    Comment: rare   Drug use: No   Sexual activity: Not Currently    Birth control/protection: None  Other Topics Concern   Not on file  Social History Narrative   Lives w/ husband, and son [name]   Retired Marine scientist   L[place] in [place] Strain: [place]  (06/08/2020)   Overall Financial Resource Strain (CARDIA)    Difficulty of Paying Living Expenses: Not hard at all  Food Insecurity: No Micro (06/08/2020)   Hunger Vital Sign    Worried About Running Out of Food in the Last Year: Never true    Ran Out of Food in the Last Year: Never true  Transportation Needs: No Transportation Needs (06/08/2020)   PRAPARE - Hydrologist (Medical): No    Lack of Transportation (Non-Medical): No  Physical Activity: Sufficiently Active (06/08/2020)   Exercise Vital Sign    Days of Exercise per Week: 7 days    Minutes of Exercise per Session: 30 min  Stress: No Stress Concern Present (06/08/2020)   [place]    Feeling of Stress : Not at all  Social Connections: Moderately Integrated (06/08/2020)   Social Connection and Isolation Panel [NHANES]    Frequency of Communication with Friends and Family: More than three times a week    Frequency of Social Gatherings with Friends and Family: Once a week    Attends Religious Services: Never    Active Member of  Clubs or Organizations: Yes    Attends Archivist Meetings: More than 4 times per year    Marital Status: Married  Human resources officer Violence: Not At Risk (06/08/2020)   Humiliation, Afraid, Rape, and Kick questionnaire    Fear of Current or Ex-Partner: No    Emotionally Abused: No    Physically Abused: No    Sexually Abused: No    Family History  Problem Relation Age of Onset   Hypertension Mother    Colon  cancer Mother    Breast cancer Mother    Ovarian cancer Mother    Hypertension Father    Diabetes Father    Head & neck cancer Sister    CAD Neg Hx     ROS- All systems are reviewed and negative except as per the HPI above  Physical Exam: Vitals:   10/26/21 0933  BP: 136/76  Pulse: 79  Weight: 105.1 kg  Height: '5\' 6"'$  (1.676 m)   Wt Readings from Last 3 Encounters:  10/26/21 105.1 kg  10/26/21 102.5 kg  10/17/21 106.3 kg    Labs: Lab Results  Component Value Date   NA 143 10/26/2021   K 3.2 (L) 10/26/2021   CL 104 10/26/2021   CO2 27 10/26/2021   GLUCOSE 119 (H) 10/26/2021   BUN 11 10/26/2021   CREATININE 0.68 10/26/2021   CALCIUM 11.0 (H) 10/26/2021   MG 2.0 07/21/2021   Lab Results  Component Value Date   INR 1.2 07/14/2021   Lab Results  Component Value Date   CHOL 173 03/23/2021   HDL 69.40 03/23/2021   LDLCALC 89 03/23/2021   TRIG 75.0 03/23/2021     GEN- The patient is well appearing, alert and oriented x 3 today.   Head- normocephalic, atraumatic Eyes-  Sclera clear, conjunctiva pink Ears- hearing intact Oropharynx- clear Neck- supple, no JVP Lymph- no cervical lymphadenopathy Lungs- Clear to ausculation bilaterally, normal work of breathing Heart- irregular rate and irregular  Rhythm(pvc's), no murmurs, rubs or gallops, PMI not laterally displaced GI- soft, NT, ND, + BS Extremities- no clubbing, cyanosis, or edema MS- no significant deformity or atrophy Skin- no rash or lesion Psych- euthymic mood, full affect Neuro- strength and sensation are intact  EKG- See ED ekg with afib at 84 bpm with RBBB Epic  records reviewed   02/11/21- Echo-1. Left ventricular ejection fraction, by estimation, is 65 to 70%. The  left ventricle has normal function. The left ventricle has no regional  wall motion abnormalities. There is moderate asymmetric left ventricular  hypertrophy of the basal-septal  segment. Left ventricular diastolic parameters are  indeterminate.   2. Right ventricular systolic function is normal. The right ventricular  size is mildly enlarged. There is normal pulmonary artery systolic  pressure. The estimated right ventricular systolic pressure is 35.3 mmHg.   3. The mitral valve is normal in structure. Trivial mitral valve  regurgitation. No evidence of mitral stenosis.   4. The aortic valve is tricuspid. Aortic valve regurgitation is not  visualized. No aortic stenosis is present.   5. The inferior vena cava is normal in size with greater than 50%  respiratory variability, suggesting right atrial pressure of 3 mmHg.    Assessment and Plan: 1. Paroxysmal afib  This episode started this past Friday with controlled v rates e  I will increase 120 of Cardizem to bid, am of cardioversion leave dose at 120 mg daily If she feels she cardioverts spontaneously, contact device clinic to run  a report from  her PPM and send  to me to confirm and  CV will be cancelled   2. CHA2DS2VASc score of 3 Continue xarelto 20 mg daily Missed xarelto on Thursday due to tooth extraction  She would like to have a cardioversion asap, will need TEE with missed xarelto dose   3. HTN Normal range since arrival to the ED Can not explain why she had a sudden but brief increase in BP early this am   4. Hypokalemia 3.2 in the ED Will rx 20 meq K+ to take for the next 3 days May be secondary to  oral intake  being  off since tooth extraction  Repeat cbc/bmet am of cardioversion here   F/u in afib clinic one week after cardioversion    Butch Penny C. Sebastin Perlmutter, [facility] [address] [place], [place] [address] [phone]

## 2021-10-26 NOTE — ED Triage Notes (Addendum)
EMS stated, she has been in A-Fib for 36 hours. This morning she took Cardizem '30mg'$  this morning. Took '160mg'$ . Cardizem last night. She stated her automatic BP 240/122 at home. She had a A Fib appt this morning at 0900 in the A-Fib clinic but felt like she could not wait. Pt stated, Ive had A Fib  for the past 36 hours which I usually dont go this long. My BP was high. I took all my meds. I feel like a heaviness across my chest and my heart is pounding.

## 2021-10-27 ENCOUNTER — Other Ambulatory Visit (HOSPITAL_COMMUNITY): Payer: Self-pay | Admitting: Nurse Practitioner

## 2021-10-27 DIAGNOSIS — I480 Paroxysmal atrial fibrillation: Secondary | ICD-10-CM

## 2021-10-27 MED ORDER — DILTIAZEM HCL ER COATED BEADS 120 MG PO CP24: 120.0000 mg | ORAL_CAPSULE | Freq: Two times a day (BID) | ORAL | 0 refills | Status: DC

## 2021-10-27 NOTE — Addendum Note (Signed)
Encounter addended by: Enid Derry, CMA on: 10/27/2021 4:42 PM  Actions taken: Order list changed

## 2021-10-28 ENCOUNTER — Telehealth: Payer: Self-pay

## 2021-10-28 NOTE — Telephone Encounter (Signed)
CPAP supplies form signed and faxed back to [place] at [phone]. Form sent for scanning.

## 2021-10-31 ENCOUNTER — Other Ambulatory Visit (HOSPITAL_COMMUNITY): Payer: Self-pay | Admitting: *Deleted

## 2021-10-31 ENCOUNTER — Telehealth (HOSPITAL_COMMUNITY): Payer: Self-pay

## 2021-10-31 ENCOUNTER — Telehealth: Payer: Self-pay

## 2021-10-31 MED ORDER — DILTIAZEM HCL ER COATED BEADS 180 MG PO CP24: 180.0000 mg | ORAL_CAPSULE | Freq: Every day | ORAL | 0 refills | Status: DC

## 2021-10-31 NOTE — Telephone Encounter (Signed)
Patient called in to let us know she has been back in Normal Sinus Rhythm since yesterday at 1am. She will have the device clinic transmit a report to verify she is back in normal sinus rhythm. Patient went back to taking her Diltiazem 180 mg at 10 pm on 10/29. Consulted with [employer] and he was okay with her continuing to take the Diltiazem 180 mg once daily. She is scheduled to see Ceasar Lund on 11/8 at 10:30 am. Communicated with patient and she verbalized understanding.

## 2021-10-31 NOTE — Telephone Encounter (Signed)
I spoke with the patient and she is going to call me when she get done shopping for me to help her send a manual transmission. She wants to see if she is back in normal sinus rhythm. If so she needs someone to let the A-fib clinic know so they can cancel the cardioversion for tomorrow.

## 2021-11-01 ENCOUNTER — Ambulatory Visit (HOSPITAL_COMMUNITY): Admission: RE | Admit: 2021-11-01 | Payer: Medicare Other | Source: Home / Self Care | Admitting: Cardiology

## 2021-11-01 ENCOUNTER — Encounter (HOSPITAL_COMMUNITY): Admission: RE | Payer: Self-pay | Source: Home / Self Care

## 2021-11-01 ENCOUNTER — Other Ambulatory Visit (HOSPITAL_COMMUNITY): Payer: Medicare Other | Admitting: Nurse Practitioner

## 2021-11-01 ENCOUNTER — Ambulatory Visit (INDEPENDENT_AMBULATORY_CARE_PROVIDER_SITE_OTHER): Payer: Medicare Other | Admitting: Family Medicine

## 2021-11-01 ENCOUNTER — Encounter: Payer: Self-pay | Admitting: Family Medicine

## 2021-11-01 VITALS — BP 142/70 | HR 88 | Temp 98.0°F | Resp 18 | Ht 66.0 in | Wt 241.0 lb

## 2021-11-01 DIAGNOSIS — R12 Heartburn: Secondary | ICD-10-CM | POA: Diagnosis not present

## 2021-11-01 DIAGNOSIS — E876 Hypokalemia: Secondary | ICD-10-CM | POA: Diagnosis not present

## 2021-11-01 DIAGNOSIS — B370 Candidal stomatitis: Secondary | ICD-10-CM | POA: Diagnosis not present

## 2021-11-01 DIAGNOSIS — K219 Gastro-esophageal reflux disease without esophagitis: Secondary | ICD-10-CM | POA: Diagnosis not present

## 2021-11-01 LAB — CBC WITH DIFFERENTIAL/PLATELET
Basophils Absolute: 0.1 10*3/uL (ref 0.0–0.1)
Basophils Relative: 1 % (ref 0.0–3.0)
Eosinophils Absolute: 0.1 10*3/uL (ref 0.0–0.7)
Eosinophils Relative: 2 % (ref 0.0–5.0)
HCT: 38.3 % (ref 36.0–46.0)
Hemoglobin: 12.5 g/dL (ref 12.0–15.0)
Lymphocytes Relative: 20.6 % (ref 12.0–46.0)
Lymphs Abs: 1.2 10*3/uL (ref 0.7–4.0)
MCHC: 32.6 g/dL (ref 30.0–36.0)
MCV: 87.1 fl (ref 78.0–100.0)
Monocytes Absolute: 0.6 10*3/uL (ref 0.1–1.0)
Monocytes Relative: 10.2 % (ref 3.0–12.0)
Neutro Abs: 3.9 10*3/uL (ref 1.4–7.7)
Neutrophils Relative %: 66.2 % (ref 43.0–77.0)
Platelets: 278 10*3/uL (ref 150.0–400.0)
RBC: 4.4 Mil/uL (ref 3.87–5.11)
RDW: 14.9 % (ref 11.5–15.5)
WBC: 5.8 10*3/uL (ref 4.0–10.5)

## 2021-11-01 LAB — COMPREHENSIVE METABOLIC PANEL
ALT: 18 U/L (ref 0–35)
AST: 18 U/L (ref 0–37)
Albumin: 4.3 g/dL (ref 3.5–5.2)
Alkaline Phosphatase: 90 U/L (ref 39–117)
BUN: 18 mg/dL (ref 6–23)
CO2: 32 mEq/L (ref 19–32)
Calcium: 10.8 mg/dL — ABNORMAL HIGH (ref 8.4–10.5)
Chloride: 102 mEq/L (ref 96–112)
Creatinine, Ser: 0.61 mg/dL (ref 0.40–1.20)
GFR: 88.41 mL/min (ref >60.00)
Glucose, Bld: 80 mg/dL (ref 70–99)
Potassium: 3.6 mEq/L (ref 3.5–5.1)
Sodium: 142 mEq/L (ref 135–145)
Total Bilirubin: 0.5 mg/dL (ref 0.2–1.2)
Total Protein: 6.8 g/dL (ref 6.0–8.3)

## 2021-11-01 LAB — LIPASE: Lipase: 26 U/L (ref 11.0–59.0)

## 2021-11-01 LAB — H. PYLORI ANTIBODY, IGG: H Pylori IgG: NEGATIVE

## 2021-11-01 LAB — AMYLASE: Amylase: 39 U/L (ref 27–131)

## 2021-11-01 SURGERY — ECHOCARDIOGRAM, TRANSESOPHAGEAL
Anesthesia: Monitor Anesthesia Care

## 2021-11-01 MED ORDER — POTASSIUM CHLORIDE CRYS ER 20 MEQ PO TBCR: EXTENDED_RELEASE_TABLET | ORAL | 2 refills | Status: DC

## 2021-11-01 MED ORDER — NYSTATIN 100000 UNIT/ML MT SUSP: 5.0000 mL | Freq: Four times a day (QID) | OROMUCOSAL | 0 refills | Status: DC

## 2021-11-01 NOTE — Progress Notes (Addendum)
[id number]  Subjective:   By signing my name below, I, Veronica Bradford, attest that this documentation has been prepared under the direction and in the presence of Veronica Held DO 11/01/2021    Patient ID: Veronica Bradford, female    DOB: [birth year], 76 y.o.   MRN: [id number]  Chief Complaint  Patient presents with   Abdominal Pain    Pt states sxs having been going on for almost a month. Pt states seeing Veronica Bradford about 2 weeks ago. Pt states starting Protonix twice a day and carafate. Pt states contacting GI and was advised this was fine. Pt states waking up with a feeling of indigestion. Pt states taking Mylanta, pepcid and states throat continues to burn. Pt states medication isn't helping.     Abdominal Pain Pertinent negatives include no dysuria, fever, frequency, headaches or nausea.   Patient is in today for an office visit  She complains of indigestion symptoms that occurred about a month ago. She also has associated symptoms of throat and tongue burning. She notes that she has a history of IBS and was also diagnosed with small bowel overgrowth. She had an appointment with Veronica Bradford on 10/17/2021 and was shown that her blood count is improving. She has previously been in contact Veronica Bradford and was informed that taking Protonix twice a day and Carafate was okay. However, her burning is persistent. She normally sleeps with an elevated head and she also has been modifying her diet. She has also tried OTC Pepcid and Maalox with limited success. She believes that she has thrush.   She reports that the beginning of last week, she went into Afib which is chronic for her. Symptoms were persistent for about 24 hours which is abnormal for her. She did not take her 40 Mg of Lasix at the time she was supposed to because she was currently taking 180 Mg of Cardizem at the time. She decided to go to bed and when she woke up, she had an increased heart rate. Her blood pressure at the time was 244/122 with  multiple rechecks. She took her Lasix medication and was admitted to the ED on 10/26/2021. During her time from her house to the hospital, her systolic blood pressure decreased to 120. Since then, her systolic blood pressure would range from 113-130. She notes that she has gained about ten pounds over the past week. She has been off her consistent diet due to her teeth extraction. She has been consuming soft foods. She reports of some lower leg swelling w/ pitting. She is now taking her Lasix medication.   She is inquiring about potassium supplements. During her time in the ED, her potassium levels were low. She usually consumes a diet consisting of oranges and bananas but due to her tooth extraction, was not able to utilize her diet.   She reports that she is UTD on her Influenza and Covid vaccines.   Past Medical History:  Diagnosis Date   Atrial fibrillation [facility])    Atrial fibrillation (HCC)    Back pain    Chronic sinusitis    s/p 2 surgeries remotely, Veronica Bradford   Closed fracture of metatarsal of left foot    L foot, fifth metatarsal   Ectopic pregnancy    Endometrial cancer ([name]) 2004   Early diagnosis   GERD (gastroesophageal reflux disease)    Hyperlipemia    Hypertension    IBS (irritable bowel syndrome) 08/13/2014   Dx 2015, diarrhea on-off, Veronica Veronica Bradford  Lumbar radiculopathy 08/2012   MRI in 08/2012   Mild hyperparathyroidism ([name])    Osteopenia    PAF (paroxysmal atrial fibrillation) (HCC)    Prediabetes    Sciatica of right side 08/2013   Received Depomedrol 80 mg injection   Sleep apnea 09/09/2013   Dx with OSA in 2015, by Veronica. Caryl Comes, APAP    Past Surgical History:  Procedure Laterality Date   ATRIAL FIBRILLATION ABLATION N/A 09/04/2017   Procedure: [place];  Surgeon: [name], MD;  Location: [facility];  Service: Cardiovascular;  Laterality: N/A;   BIOPSY  03/11/2020   Procedure: BIOPSY;  Surgeon: Veronica Lobo, MD;  Location:  [facility];  Service: Endoscopy;;  EGD and COLON   CARPAL TUNNEL RELEASE Right 06/17/2019   CARPAL TUNNEL RELEASE Left 11/2020   CHOLECYSTECTOMY  ~2006   COLONOSCOPY WITH PROPOFOL N/A 03/11/2020   Procedure: COLONOSCOPY WITH PROPOFOL;  Surgeon: Veronica Lobo, MD;  Location: [facility];  Service: Endoscopy;  Laterality: N/A;   DILATION AND CURETTAGE OF UTERUS     after SAB   ESOPHAGOGASTRODUODENOSCOPY  02/09/2014   Veronica. 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/2020   Procedure: PACEMAKER IMPLANT;  Surgeon: Veronica Sprang, MD;  Location: [facility];  Service: Cardiovascular;  Laterality: N/A;   TONSILLECTOMY     TOTAL ABDOMINAL HYSTERECTOMY W/ BILATERAL SALPINGOOPHORECTOMY     TOTAL HIP ARTHROPLASTY Left 07/18/2021   Procedure: TOTAL HIP ARTHROPLASTY ANTERIOR APPROACH;  Surgeon: Veronica Can, MD;  Location: [facility];  Service: Orthopedics;  Laterality: Left;   TOTAL KNEE ARTHROPLASTY Right ~2008    Family History  Problem Relation Age of Onset   Hypertension Mother    Colon cancer Mother    Breast cancer Mother    Ovarian cancer Mother    Hypertension Father    Diabetes Father    Head & neck cancer Sister    CAD Neg Hx     Social History   Socioeconomic History   Marital status: Married    Spouse name: Not on file   Number of children: 1   Years of education: Not on file   Highest education level: Not on file  Occupational History   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  Tobacco Use   Smoking status: Former    Types: Cigarettes    Quit date: 01/06/1980    Years since quitting: 41.8   Smokeless tobacco: Never   Tobacco comments:    smoked from [place] to 1982, less  than 1 ppd  Vaping Use   Vaping Use: Never used  Substance and Sexual Activity   Alcohol use: Yes    Comment: rare   Drug use: No   Sexual activity: Not Currently    Birth control/protection: None  Other Topics Concern   Not on file  Social History Narrative   Lives w/ husband, and son [name]   Retired Marine scientist   Lives in [place] Strain: [place]  (06/08/2020)   Overall Financial Resource Strain ([place])    Difficulty of Paying Living Expenses: Not hard at all  Food Insecurity: No [place] (06/08/2020)  Hunger Vital Sign    Worried About Running Out of Food in the Last Year: Never true    Ran Out of Food in the Last Year: Never true  Transportation Needs: No Transportation Needs (06/08/2020)   PRAPARE - Hydrologist (Medical): No    Lack of Transportation (Non-Medical): No  Physical Activity: Sufficiently Active (06/08/2020)   Exercise Vital Sign    Days of Exercise per Week: 7 days    Minutes of Exercise per Session: 30 min  Stress: No Stress Concern Present (06/08/2020)   [place]    Feeling of Stress : Not at all  Social Connections: Moderately Integrated (06/08/2020)   Social Connection and Isolation Panel [NHANES]    Frequency of Communication with Friends and Family: More than three times a week    Frequency of Social Gatherings with Friends and Family: Once a week    Attends Religious Services: Never    Marine scientist or Organizations: Yes    Attends Music therapist: More than 4 times per year    Marital Status: Married  Human resources officer Violence: Not At Risk (06/08/2020)   Humiliation, Afraid, Rape, and Kick questionnaire    Fear of Current or Ex-Partner: No    Emotionally Abused: No    Physically Abused: No    Sexually Abused: No    Outpatient Medications Prior to Visit  Medication Sig Dispense  Refill   acetaminophen (TYLENOL) 500 MG tablet Take 500 mg by mouth every 6 (six) hours as needed for moderate pain or headache.     acetaminophen-codeine (TYLENOL #3) 300-30 MG tablet Take 1 tablet by mouth at bedtime as needed for moderate pain. (Patient taking differently: Take 1 tablet by mouth daily as needed (back pain.).) 30 tablet 0   ARTIFICIAL TEAR SOLUTION OP Place 1 drop into both eyes 4 (four) times daily as needed (dry/irritated eyes.).     cholecalciferol (VITAMIN D3) 25 MCG (1000 UNIT) tablet Take 1,000 Units by mouth in the morning.     Chromium Picolinate 1000 MCG TABS Take 500 mcg by mouth daily after supper.     clindamycin (CLEOCIN) 150 MG capsule Take 150 mg by mouth 4 (four) times daily.     CRANBERRY EXTRACT PO Take 2 tablets by mouth at bedtime.     Cyanocobalamin (VITAMIN B 12 PO) Take 1 tablet by mouth daily.     diltiazem (CARDIZEM CD) 180 MG 24 hr capsule Take 1 capsule (180 mg total) by mouth daily. 30 capsule 0   diltiazem (CARDIZEM) 30 MG tablet Take 1 tablet every 4 hours AS NEEDED for heart rate >100 as long as blood pressure >100. 45 tablet 1   estradiol (ESTRACE) 0.1 MG/GM vaginal cream Place 1 Applicatorful vaginally 3 (three) times a week.     ferrous sulfate 325 (65 FE) MG tablet Take 325 mg by mouth in the morning.     flecainide (TAMBOCOR) 100 MG tablet Take 1 tablet (100 mg total) by mouth 2 (two) times daily. 180 tablet 3   fluticasone (FLONASE) 50 MCG/ACT nasal spray Place 1 spray into both nostrils daily.     FOLIC ACID PO Take 732 mcg by mouth in the morning.     furosemide (LASIX) 40 MG tablet Take 1 tablet (40 mg total) by mouth daily. 90 tablet 3   Omega-3 Fatty Acids (FISH OIL PO) Take 1 tablet by mouth every evening.  pantoprazole (PROTONIX) 40 MG tablet Take 1 tablet (40 mg total) by mouth 2 (two) times daily.     pravastatin (PRAVACHOL) 40 MG tablet TAKE 1 TABLET(40 MG) BY MOUTH AT BEDTIME 90 tablet 1   psyllium (REGULOID) 0.52 g capsule  Take 2 capsules by mouth daily.     rivaroxaban (XARELTO) 20 MG TABS tablet Take 1 tablet (20 mg total) by mouth daily with supper. 90 tablet 3   triamterene-hydrochlorothiazide (MAXZIDE-25) 37.5-25 MG tablet Take 0.5 tablets by mouth daily. Please hold this medication, please discuss with your pcp and cardiology about resuming this meds     potassium chloride SA (KLOR-CON M20) 20 MEQ tablet Take one tablet by mouth for only 3 days 3 tablet 0   No facility-administered medications prior to visit.    Allergies  Allergen Reactions   Ibuprofen Other (See Comments)    Pt is on Xarelto   Lactose Intolerance (Gi) Diarrhea and Nausea And Vomiting   Lisinopril Cough   Nsaids Other (See Comments)    Pt on Xarelto   Tape     Causes blisters    Tolmetin Other (See Comments)    Pt on Xarelto   Zoster Vaccine Live Other (See Comments)    Serum sickness, high fever, neck pain    Amoxicillin Rash   Penicillins Rash    Local reaction    Review of Systems  Constitutional:  Negative for fever and malaise/fatigue.  HENT:  Negative for congestion.        (+) Throat Burning (+) Tongue Burning  Eyes:  Negative for blurred vision.  Respiratory:  Negative for shortness of breath.   Cardiovascular:  Negative for chest pain, palpitations and leg swelling.  Gastrointestinal:  Positive for abdominal pain and heartburn. Negative for blood in stool and nausea.  Genitourinary:  Negative for dysuria and frequency.  Musculoskeletal:  Negative for falls.  Skin:  Negative for rash.  Neurological:  Negative for dizziness, loss of consciousness and headaches.  Endo/Heme/Allergies:  Negative for environmental allergies.  Psychiatric/Behavioral:  Negative for depression. The patient is not nervous/anxious.        Objective:    Physical Exam Vitals and nursing note reviewed.  Constitutional:      General: She is not in acute distress.    Appearance: Normal appearance. She is not ill-appearing.  HENT:      Head: Normocephalic and atraumatic.     Right Ear: External ear normal.     Left Ear: External ear normal.     Mouth/Throat:     Comments: + white film on tongue and throat  Eyes:     Extraocular Movements: Extraocular movements intact.     Pupils: Pupils are equal, round, and reactive to light.  Cardiovascular:     Rate and Rhythm: Normal rate and regular rhythm.     Heart sounds: Normal heart sounds. No murmur heard.    No gallop.  Pulmonary:     Effort: Pulmonary effort is normal. No respiratory distress.     Breath sounds: Normal breath sounds. No wheezing or rales.  Abdominal:     General: Bowel sounds are normal. There is no distension.     Palpations: Abdomen is soft.     Tenderness: There is no abdominal tenderness. There is no right CVA tenderness, left CVA tenderness, guarding or rebound.  Skin:    General: Skin is warm and dry.  Neurological:     Mental Status: She is alert and oriented to person,  place, and time.  Psychiatric:        Judgment: Judgment normal.     BP (!) 142/70 (BP Location: Left Arm, Patient Position: Sitting, Cuff Size: Large)   Pulse 88   Temp 98 F (36.7 C) (Oral)   Resp 18   Ht _0  (1.676 m)   Wt 241 lb (109.3 kg)   SpO2 99%   BMI 38.90 kg/m  Wt Readings from Last 3 Encounters:  11/01/21 241 lb (109.3 kg)  10/26/21 231 lb 9.6 oz (105.1 kg)  10/26/21 226 lb (102.5 kg)    Diabetic Foot Exam - Simple   No data filed    Lab Results  Component Value Date   WBC 6.8 10/26/2021   HGB 15.2 (H) 10/26/2021   HCT 47.5 (H) 10/26/2021   PLT 299 10/26/2021   GLUCOSE 119 (H) 10/26/2021   CHOL 173 03/23/2021   TRIG 75.0 03/23/2021   HDL 69.40 03/23/2021   LDLCALC 89 03/23/2021   ALT 23 07/27/2021   AST 20 07/27/2021   NA 143 10/26/2021   K 3.2 (L) 10/26/2021   CL 104 10/26/2021   CREATININE 0.68 10/26/2021   BUN 11 10/26/2021   CO2 27 10/26/2021   TSH 1.30 03/23/2021   INR 1.2 07/14/2021   HGBA1C 4.8 03/23/2021    Lab  Results  Component Value Date   TSH 1.30 03/23/2021   Lab Results  Component Value Date   WBC 6.8 10/26/2021   HGB 15.2 (H) 10/26/2021   HCT 47.5 (H) 10/26/2021   MCV 89.3 10/26/2021   PLT 299 10/26/2021   Lab Results  Component Value Date   NA 143 10/26/2021   K 3.2 (L) 10/26/2021   CO2 27 10/26/2021   GLUCOSE 119 (H) 10/26/2021   BUN 11 10/26/2021   CREATININE 0.68 10/26/2021   BILITOT 0.5 07/27/2021   ALKPHOS 66 07/27/2021   AST 20 07/27/2021   ALT 23 07/27/2021   PROT 6.5 07/27/2021   ALBUMIN 3.3 (L) 07/27/2021   CALCIUM 11.0 (H) 10/26/2021   ANIONGAP 12 10/26/2021   EGFR 76 12/14/2020   GFR 86.60 07/27/2021   Lab Results  Component Value Date   CHOL 173 03/23/2021   Lab Results  Component Value Date   HDL 69.40 03/23/2021   Lab Results  Component Value Date   LDLCALC 89 03/23/2021   Lab Results  Component Value Date   TRIG 75.0 03/23/2021   Lab Results  Component Value Date   CHOLHDL 2 03/23/2021   Lab Results  Component Value Date   HGBA1C 4.8 03/23/2021       Assessment & Plan:   Problem List Items Addressed This Visit       Unprioritized   Thrush    Pt has been on antibiotics  Nystatin swish and  Swallow       Relevant Medications   nystatin (MYCOSTATIN) 100000 UNIT/ML suspension   GERD (gastroesophageal reflux disease)    con't protonix bid per gi and carafate Check labs and f/u gi prn      Other Visit Diagnoses     Heartburn    -  Primary   Relevant Orders   CBC with Differential/Platelet   Comprehensive metabolic panel   H. pylori antibody, IgG   Amylase   Lipase   Hypokalemia       Relevant Medications   potassium chloride SA (KLOR-CON M20) 20 MEQ tablet      Meds ordered this encounter  Medications  nystatin (MYCOSTATIN) 100000 UNIT/ML suspension    Sig: Take 5 mLs (500,000 Units total) by mouth 4 (four) times daily.    Dispense:  60 mL    Refill:  0   potassium chloride SA (KLOR-CON M20) 20 MEQ tablet     Sig: Take one tablet by mouth for only 3 days    Dispense:  30 tablet    Refill:  2    I, Veronica Held, DO, personally preformed the services described in this documentation.  All medical record entries made by the scribe were at my direction and in my presence.  I have reviewed the chart and discharge instructions (if applicable) and agree that the record reflects my personal performance and is accurate and complete. 11/01/2021   I,Amber Collins,acting as a scribe for Veronica Held, DO.,have documented all relevant documentation on the behalf of Veronica Held, DO,as directed by  Veronica Held, DO while in the presence of Veronica Held, DO.    Veronica Held, DO

## 2021-11-01 NOTE — Assessment & Plan Note (Signed)
Pt has been on antibiotics  Nystatin swish and  Swallow

## 2021-11-01 NOTE — Telephone Encounter (Signed)
DCCV canceled

## 2021-11-01 NOTE — Assessment & Plan Note (Signed)
con't protonix bid per gi and carafate Check labs and f/u gi prn

## 2021-11-01 NOTE — Patient Instructions (Signed)
Food Choices for Gastroesophageal Reflux Disease, Adult When you have gastroesophageal reflux disease (GERD), the foods you eat and your eating habits are very important. Choosing the right foods can help ease the discomfort of GERD. Consider working with a dietitian to help you make healthy food choices. What are tips for following this plan? Reading food labels Look for foods that are low in saturated fat. Foods that have less than 5% of daily value (DV) of fat and 0 g of trans fats may help with your symptoms. Cooking Cook foods using methods other than frying. This may include baking, steaming, grilling, or broiling. These are all methods that do not need a lot of fat for cooking. To add flavor, try to use herbs that are low in spice and acidity. Meal planning  Choose healthy foods that are low in fat, such as fruits, vegetables, whole grains, low-fat dairy products, lean meats, fish, and poultry. Eat frequent, small meals instead of three large meals each day. Eat your meals slowly, in a relaxed setting. Avoid bending over or lying down until 2-3 hours after eating. Limit high-fat foods such as fatty meats or fried foods. Limit your intake of fatty foods, such as oils, butter, and shortening. Avoid the following as told by your health care provider: Foods that cause symptoms. These may be different for different people. Keep a food diary to keep track of foods that cause symptoms. Alcohol. Drinking large amounts of liquid with meals. Eating meals during the 2-3 hours before bed. Lifestyle Maintain a healthy weight. Ask your health care provider what weight is healthy for you. If you need to lose weight, work with your health care provider to do so safely. Exercise for at least 30 minutes on 5 or more days each week, or as told by your health care provider. Avoid wearing clothes that fit tightly around your waist and chest. Do not use any products that contain nicotine or tobacco. These  products include cigarettes, chewing tobacco, and vaping devices, such as e-cigarettes. If you need help quitting, ask your health care provider. Sleep with the head of your bed raised. Use a wedge under the mattress or blocks under the bed frame to raise the head of the bed. Chew sugar-free gum after mealtimes. What foods should I eat?  Eat a healthy, well-balanced diet of fruits, vegetables, whole grains, low-fat dairy products, lean meats, fish, and poultry. Each person is different. Foods that may trigger symptoms in one person may not trigger any symptoms in another person. Work with your health care provider to identify foods that are safe for you. The items listed above may not be a complete list of recommended foods and beverages. Contact a dietitian for more information. What foods should I avoid? Limiting some of these foods may help manage the symptoms of GERD. Everyone is different. Consult a dietitian or your health care provider to help you identify the exact foods to avoid, if any. Fruits Any fruits prepared with added fat. Any fruits that cause symptoms. For some people this may include citrus fruits, such as oranges, grapefruit, pineapple, and lemons. Vegetables Deep-fried vegetables. French fries. Any vegetables prepared with added fat. Any vegetables that cause symptoms. For some people, this may include tomatoes and tomato products, chili peppers, onions and garlic, and horseradish. Grains Pastries or quick breads with added fat. Meats and other proteins High-fat meats, such as fatty beef or pork, hot dogs, ribs, ham, sausage, salami, and bacon. Fried meat or protein, including   fried fish and fried chicken. Nuts and nut butters, in large amounts. Dairy Whole milk and chocolate milk. Sour cream. Cream. Ice cream. Cream cheese. Milkshakes. Fats and oils Butter. Margarine. Shortening. Ghee. Beverages Coffee and tea, with or without caffeine. Carbonated beverages. Sodas. Energy  drinks. Fruit juice made with acidic fruits, such as orange or grapefruit. Tomato juice. Alcoholic drinks. Sweets and desserts Chocolate and cocoa. Donuts. Seasonings and condiments Pepper. Peppermint and spearmint. Added salt. Any condiments, herbs, or seasonings that cause symptoms. For some people, this may include curry, hot sauce, or vinegar-based salad dressings. The items listed above may not be a complete list of foods and beverages to avoid. Contact a dietitian for more information. Questions to ask your health care provider Diet and lifestyle changes are usually the first steps that are taken to manage symptoms of GERD. If diet and lifestyle changes do not improve your symptoms, talk with your health care provider about taking medicines. Where to find more information International Foundation for Gastrointestinal Disorders: aboutgerd.org Summary When you have gastroesophageal reflux disease (GERD), food and lifestyle choices may be very helpful in easing the discomfort of GERD. Eat frequent, small meals instead of three large meals each day. Eat your meals slowly, in a relaxed setting. Avoid bending over or lying down until 2-3 hours after eating. Limit high-fat foods such as fatty meats or fried foods. This information is not intended to replace advice given to you by your health care provider. Make sure you discuss any questions you have with your health care provider. Document Revised: 06/30/2019 Document Reviewed: 06/30/2019 Elsevier Patient Education  2023 Elsevier Inc.  

## 2021-11-05 ENCOUNTER — Other Ambulatory Visit: Payer: Self-pay | Admitting: Internal Medicine

## 2021-11-05 DIAGNOSIS — I480 Paroxysmal atrial fibrillation: Secondary | ICD-10-CM

## 2021-11-07 NOTE — Telephone Encounter (Signed)
Prescription refill request for Xarelto received.  Indication: Afib  Last office visit:  10/26/21 Kayleen Memos)  Weight: 107.3kg Age: 76 Scr:  0.61 (11/01/21)  CrCl: 141.11m/min  Appropriate dose and refill sent to requested pharmacy.

## 2021-11-09 ENCOUNTER — Other Ambulatory Visit (HOSPITAL_COMMUNITY): Payer: Self-pay | Admitting: *Deleted

## 2021-11-09 ENCOUNTER — Ambulatory Visit (HOSPITAL_COMMUNITY): Payer: Medicare Other | Admitting: Nurse Practitioner

## 2021-11-09 MED ORDER — DILTIAZEM HCL ER COATED BEADS 180 MG PO CP24: 180.0000 mg | ORAL_CAPSULE | Freq: Every day | ORAL | 2 refills | Status: DC

## 2021-11-11 NOTE — Telephone Encounter (Signed)
Reviewed the following transmission at patient request due to feeling like she was experiencing elevated, irregular heart beats this week that were waking her up in the am.   Of note:  Patient is in NSR (AP/VS 60's) Atrial events for November are actually farfield (confirmed by industry rep) oversensing of the ventricular contraction on the A lead.  Ventricular rates are well controlled.  Do not see any device function that would correlate with patients symptoms this week.   Will defer to Afib clinic for further recommendations and cc to Dr. Caryl Comes for when he returns next week.  Responded to patient in mychart.               EXAMPLE OF Recorded atrial event; however, not an atrial event.  This is farfield (over sensing) of the ventricular contraction which is recording on the A lead as AR.  Confirmed with Karilyn Cota with [employer].  Note all recorded atrial events for November reviewed follow the same pattern.  Not true atrial arrhythmias.

## 2021-11-22 ENCOUNTER — Other Ambulatory Visit: Payer: Self-pay | Admitting: Internal Medicine

## 2021-12-05 ENCOUNTER — Telehealth (HOSPITAL_COMMUNITY): Payer: Self-pay

## 2021-12-05 DIAGNOSIS — I872 Venous insufficiency (chronic) (peripheral): Secondary | ICD-10-CM | POA: Diagnosis not present

## 2021-12-05 DIAGNOSIS — R600 Localized edema: Secondary | ICD-10-CM | POA: Diagnosis not present

## 2021-12-05 NOTE — Telephone Encounter (Signed)
Patient called in regarding her A-fib. B/P- 130/65 range and HR 101. She has been in A-fib since   1 pm today. Patient states she has taken Diltiazem 120 mg tablet at 3 pm today. Consulted with Ricky fenton -[place] and he would like for her to take Diltiazem 120 mg twice daily until she converts in normal sinus rhythm. She will take her next dose of Diltiazem '120mg'$  at 1 am. She has sent a transmission. We will wait for results. Communicated with patient and she verbalized understanding.

## 2021-12-07 ENCOUNTER — Telehealth: Payer: Self-pay | Admitting: Pharmacist

## 2021-12-07 NOTE — Telephone Encounter (Signed)
Medication list reviewed in anticipation of upcoming Tikosyn initiation. Patient is taking triamterene-HCTZ and flecainide - both are contraindicated with Tikosyn and will need to be stopped 3 days prior to Tikosyn initiation.  Patient is anticoagulated on Xarelto '20mg'$  daily on the appropriate dose. Please ensure that patient has not missed any anticoagulation doses in the 3 weeks prior to Tikosyn initiation.   Patient will need to be counseled to avoid use of Benadryl while on Tikosyn and in the 2-3 days prior to Tikosyn initiation.

## 2021-12-08 NOTE — Telephone Encounter (Signed)
Pt instructed to stop triamterene-hctz and flecainide 3 days prior to admission. Pt will start valsartan '360mg'$  once a day after stopping triamterene-hctz.

## 2021-12-12 DIAGNOSIS — I83893 Varicose veins of bilateral lower extremities with other complications: Secondary | ICD-10-CM | POA: Diagnosis not present

## 2021-12-12 DIAGNOSIS — H35371 Puckering of macula, right eye: Secondary | ICD-10-CM | POA: Diagnosis not present

## 2021-12-13 ENCOUNTER — Ambulatory Visit (INDEPENDENT_AMBULATORY_CARE_PROVIDER_SITE_OTHER): Payer: Medicare Other

## 2021-12-13 DIAGNOSIS — I495 Sick sinus syndrome: Secondary | ICD-10-CM

## 2021-12-13 LAB — CUP PACEART REMOTE DEVICE CHECK
Battery Remaining Longevity: 104 mo
Battery Remaining Percentage: 90 %
Battery Voltage: 3.02 V
Brady Statistic AP VP Percent: 15 %
Brady Statistic AP VS Percent: 82 %
Brady Statistic AS VP Percent: 1 %
Brady Statistic AS VS Percent: 2.6 %
Brady Statistic RA Percent Paced: 95 %
Brady Statistic RV Percent Paced: 16 %
Date Time Interrogation Session: 20231212020014
Implantable Lead Connection Status: 753985
Implantable Lead Connection Status: 753985
Implantable Lead Implant Date: 20220912
Implantable Lead Implant Date: 20220912
Implantable Lead Location: 753859
Implantable Lead Location: 753860
Implantable Lead Model: 3830
Implantable Pulse Generator Implant Date: 20220912
Lead Channel Impedance Value: 440 Ohm
Lead Channel Impedance Value: 600 Ohm
Lead Channel Pacing Threshold Amplitude: 0.75 V
Lead Channel Pacing Threshold Amplitude: 1.25 V
Lead Channel Pacing Threshold Pulse Width: 0.4 ms
Lead Channel Pacing Threshold Pulse Width: 0.4 ms
Lead Channel Sensing Intrinsic Amplitude: 0.7 mV
Lead Channel Sensing Intrinsic Amplitude: 11.7 mV
Lead Channel Setting Pacing Amplitude: 2 V
Lead Channel Setting Pacing Amplitude: 2.5 V
Lead Channel Setting Pacing Pulse Width: 0.4 ms
Lead Channel Setting Sensing Sensitivity: 2 mV
Pulse Gen Model: 2272
Pulse Gen Serial Number: 3956585

## 2021-12-14 ENCOUNTER — Encounter: Payer: Self-pay | Admitting: Internal Medicine

## 2021-12-16 ENCOUNTER — Encounter: Payer: Self-pay | Admitting: Internal Medicine

## 2021-12-16 ENCOUNTER — Ambulatory Visit (INDEPENDENT_AMBULATORY_CARE_PROVIDER_SITE_OTHER): Payer: Medicare Other | Admitting: Internal Medicine

## 2021-12-16 VITALS — BP 123/58 | HR 61 | Temp 97.8°F | Resp 12 | Ht 66.0 in | Wt 239.8 lb

## 2021-12-16 DIAGNOSIS — M546 Pain in thoracic spine: Secondary | ICD-10-CM | POA: Diagnosis not present

## 2021-12-16 DIAGNOSIS — E669 Obesity, unspecified: Secondary | ICD-10-CM | POA: Diagnosis not present

## 2021-12-16 DIAGNOSIS — J069 Acute upper respiratory infection, unspecified: Secondary | ICD-10-CM | POA: Diagnosis not present

## 2021-12-16 NOTE — Progress Notes (Unsigned)
Subjective:    Patient ID: Veronica Bradford, female    DOB: [birth year], 76 y.o.   MRN: [id number]  DOS:  12/16/2021 Type of visit - description: f/u  Since the last office visit is doing okay. Was seen with back pain, although the pain is still there it has improved, previously described as 1/10. Pain is worsened with certain movements, stretching or cough.  Had a cold 4 weeks ago, she recuperated however in the last few days has noticed some postnasal dripping and cough.  Denies fever chills No chest congestion but + nasal congestion and some green discharge when she rinses her nose with warm saline.  Review of Systems See above   Past Medical History:  Diagnosis Date   Atrial fibrillation ([place])    Atrial fibrillation ([facility])    Back pain    Chronic sinusitis    s/p 2 surgeries remotely, [name]   Closed fracture of metatarsal of left foot    L foot, fifth metatarsal   Ectopic pregnancy    Endometrial cancer ([place]) 2004   Early diagnosis   GERD (gastroesophageal reflux disease)    Hyperlipemia    Hypertension    IBS (irritable bowel syndrome) 08/13/2014   Dx 2015, diarrhea on-off, Dr [name]    Lumbar radiculopathy 08/2012   MRI in 08/2012   Mild hyperparathyroidism ([place])    Osteopenia    PAF (paroxysmal atrial fibrillation) (H[facility])    Prediabetes    Sciatica of right side 08/2013   Received Depomedrol 80 mg injection   Sleep apnea 09/09/2013   Dx with OSA in 2015, by Dr. Caryl Comes, APAP    Past Surgical History:  Procedure Laterality Date   ATRIAL FIBRILLATION ABLATION N/A 09/04/2017   Procedure: [place];  Surgeon: [name], MD;  Location: [facility];  Service: Cardiovascular;  Laterality: N/A;   BIOPSY  03/11/2020   Procedure: BIOPSY;  Surgeon: [name], MD;  Location: [facility];  Service: Endoscopy;;  EGD and COLON   CARPAL TUNNEL RELEASE Right 06/17/2019   CARPAL TUNNEL RELEASE Left 11/2020   CHOLECYSTECTOMY  ~2006    COLONOSCOPY WITH PROPOFOL N/A 03/11/2020   Procedure: COLONOSCOPY WITH PROPOFOL;  Surgeon: [name], MD;  Location: [facility];  Service: Endoscopy;  Laterality: N/A;   DILATION AND CURETTAGE OF UTERUS     after SAB   ESOPHAGOGASTRODUODENOSCOPY  02/09/2014   Dr. 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/2020   Procedure: PACEMAKER IMPLANT;  Surgeon: [name], MD;  Location: [place] [facility];  Service: Cardiovascular;  Laterality: N/A;   TONSILLECTOMY     TOTAL ABDOMINAL HYSTERECTOMY W/ BILATERAL SALPINGOOPHORECTOMY     TOTAL HIP ARTHROPLASTY Left 07/18/2021   Procedure: TOTAL HIP ARTHROPLASTY ANTERIOR APPROACH;  Surgeon: Rod Can, MD;  Location: [facility];  Service: Orthopedics;  Laterality: Left;   TOTAL KNEE ARTHROPLASTY Right ~2008    Current Outpatient Medications  Medication Instructions   acetaminophen (TYLENOL) 500 mg, Oral, Every 6 hours PRN   ARTIFICIAL TEAR SOLUTION OP 1 drop, Both Eyes, 4 times daily PRN   B Complex Vitamins (B COMPLEX 50 PO) 1 tablet, Oral, Daily   cholecalciferol (  VITAMIN D3) 1,000 Units, Oral, Every morning   Chromium Picolinate 500 mcg, Oral, Daily after supper   Coenzyme Q10 (CO Q 10) 100 MG CAPS 1 capsule, Oral, Daily   CRANBERRY EXTRACT PO 2 tablets, Oral, Daily at bedtime   Cyanocobalamin (VITAMIN B 12 PO) 1 tablet, Oral, Daily   diltiazem (CARDIZEM CD) 180 mg, Oral, Daily   diltiazem (CARDIZEM) 30 MG tablet Take 1 tablet every 4 hours AS NEEDED for heart rate >100 as long as blood pressure >100.   estradiol (ESTRACE) 0.1 MG/GM vaginal cream 1 Applicatorful, Vaginal, 3 times weekly   ferrous sulfate 325 mg, Oral, Every morning    flecainide (TAMBOCOR) 100 mg, Oral, 2 times daily   fluticasone (FLONASE) 50 MCG/ACT nasal spray 1 spray, Each Nare, Daily   FOLIC ACID PO 465 mcg, Oral, Every morning   furosemide (LASIX) 40 mg, Oral, Daily   magnesium oxide (MAG-OX) 400 mg, Oral, Daily   Olopatadine HCl (PATADAY OP) 1 drop, Both Eyes, Daily PRN   Omega-3 Fatty Acids (FISH OIL PO) 1 tablet, Oral, Every evening   pantoprazole (PROTONIX) 40 mg, Oral, 2 times daily   potassium chloride SA (KLOR-CON M20) 20 MEQ tablet Take one tablet by mouth for only 3 days   pravastatin (PRAVACHOL) 40 mg, Oral, Daily at bedtime   psyllium (REGULOID) 0.52 g capsule 2 capsules, Oral, Daily   sucralfate (CARAFATE) 1 g, Oral, 3 times daily   triamterene-hydrochlorothiazide (MAXZIDE-25) 37.5-25 MG tablet 0.5 tablets, Oral, Daily, Please hold this medication, please discuss with your pcp and cardiology about resuming this meds   XARELTO 20 MG TABS tablet TAKE 1 TABLET BY MOUTH EVERY DAY WITH SUPPER       Objective:   Physical Exam BP (!) 123/58 (BP Location: Right Arm, Cuff Size: Large)   Pulse 61   Temp 97.8 F (36.6 C) (Oral)   Resp 12   Ht '5\' 6"'$  (1.676 m)   Wt 239 lb 12.8 oz (108.8 kg)   SpO2 98%   BMI 38.70 kg/m  General:   Well developed, NAD, BMI noted. HEENT:  Normocephalic . Face symmetric, atraumatic TMs: Slightly bulge, not red, no discharge.  Nose slightly congested.  Throat symmetric not red.  Sinuses non-TTP. Lungs:  CTA B Normal respiratory effort, no intercostal retractions, no accessory muscle use. Heart: Soft systolic murmur, heart rate in the 60s. Lower extremities: no pretibial edema bilaterally  Skin: Not pale. Not jaundice Neurologic:  alert & oriented X3.  Speech normal, gait appropriate for age and unassisted Psych--  Cognition and judgment appear intact.  Cooperative with normal attention span and concentration.  Behavior appropriate. No anxious or depressed appearing.      Assessment      Assessment: Prediabetes HTN Hyperlipidemia Chronic lower extremity edema L>R (eval by previous pcp, related to varicose veins?) CV:  -- P. Atrial fibrillation, A. flutter: on xarelto flecainide-diltiazem  prn --Ablation 09/2017 --Pacemaker 09-2020 GI: Dr [name]  --GERD (zantac or protonix prn), IBS, chronic diarrhea --GI records: [place], 2004, 2009 8 and 07-2013. Negative for polyps or IBD. + Diverticuli. EGD/cscope 11/2016; EGD/Cscope 03/11/2020   --Increased LFTs: CT abdomen 05/2016: Normal liver. 05/2017: WNL  Hepatitis B and C, Alpha 1 antitrypsin, anti-smooth muscle antibody, ANA trans-ferritin  OSA--- CPAP, Dr. Maxwell Caul Osteopenia:  Multiple  Dexas, last few:  T score 2009  -1.1; T score 2015  -0.7; T score 05/2017 -1.8 DJD --Chronic back pain-- tylenol #3 prn --- RF per PCP --history of epidurals  in the past, MRI 2014: Scoliosis,DJD --CIPRO pre dental d/t knee replacement (rx by ortho) H/o endometrial cancer, found during a hysterectomy, no chemotherapy or XRT.  Primary hyperparathyroidism: Chronic hypercalcemia, increased PTH 05/2016, refered to ENT  PLAN:  Right thoracic pain, RUQ abdominal pain: See last visit, pain intensity was 1/10, reports she is better but still has some right back pain.  Worse with certain movements.  No gross hematuria.  CBC and UA at the last visit normal.  Additionally had a chest x-ray 10/26/2021 with no acute findings.  Plan: Recommend to see orthopedic doctor. P- Atrial fibrillation: To be admitted to the hospital for Tikosyn initiation on 01-2022. Obesity: Wonders about Ozempic, on reviewing the s/e they  includes elevated heart rate.  Given number of comorbidities I am not sure if that would be ideal medication for her.  Will consider in the future. URI: Respiratory symptoms started 2 days ago, TMs are bulge.  Recommend to continue Flonase, saline rinses and add Astepro.  Call if not gradually better. Preventive care: Had RSV done. RTC 3  months CPX

## 2021-12-16 NOTE — Patient Instructions (Signed)
Please see your orthopedic doctor regards back pain        GO TO THE FRONT DESK, [place] back for a physical exam in about 3 months

## 2021-12-17 NOTE — Assessment & Plan Note (Signed)
Right thoracic pain, RUQ abdominal pain: See last visit, pain intensity was 1/10, reports she is better but still has some right back pain.  Worse with certain movements.  No gross hematuria.  CBC and UA at the last visit normal.  Additionally had a chest x-ray 10/26/2021 with no acute findings.  Plan: Recommend to see orthopedic doctor. P- Atrial fibrillation: To be admitted to the hospital for Tikosyn initiation on 01-2022. Obesity: Wonders about Ozempic, on reviewing the s/e they  includes elevated heart rate.  Given number of comorbidities I am not sure if that would be ideal medication for her.  Will consider in the future. URI: Respiratory symptoms started 2 days ago, TMs are bulge.  Recommend to continue Flonase, saline rinses and add Astepro.  Call if not gradually better. Preventive care: Had RSV done. RTC 3 months CPX

## 2021-12-19 ENCOUNTER — Other Ambulatory Visit (HOSPITAL_COMMUNITY): Payer: Self-pay | Admitting: *Deleted

## 2021-12-19 MED ORDER — PROBIOTIC DAILY PO CAPS: 1.0000 | ORAL_CAPSULE | Freq: Every day | ORAL | Status: DC

## 2021-12-20 DIAGNOSIS — I83892 Varicose veins of left lower extremities with other complications: Secondary | ICD-10-CM | POA: Diagnosis not present

## 2021-12-20 DIAGNOSIS — R600 Localized edema: Secondary | ICD-10-CM | POA: Diagnosis not present

## 2021-12-20 DIAGNOSIS — I8311 Varicose veins of right lower extremity with inflammation: Secondary | ICD-10-CM | POA: Diagnosis not present

## 2021-12-20 DIAGNOSIS — I8312 Varicose veins of left lower extremity with inflammation: Secondary | ICD-10-CM | POA: Diagnosis not present

## 2021-12-22 DIAGNOSIS — M546 Pain in thoracic spine: Secondary | ICD-10-CM | POA: Diagnosis not present

## 2021-12-22 DIAGNOSIS — S29012A Strain of muscle and tendon of back wall of thorax, initial encounter: Secondary | ICD-10-CM | POA: Diagnosis not present

## 2022-01-02 ENCOUNTER — Encounter (HOSPITAL_COMMUNITY): Payer: Self-pay

## 2022-01-03 ENCOUNTER — Encounter (HOSPITAL_COMMUNITY): Payer: Self-pay | Admitting: Cardiology

## 2022-01-03 ENCOUNTER — Other Ambulatory Visit: Payer: Self-pay

## 2022-01-03 ENCOUNTER — Ambulatory Visit (HOSPITAL_COMMUNITY)
Admission: RE | Admit: 2022-01-03 | Discharge: 2022-01-03 | Disposition: A | Discharge status: Home / Self Care | Payer: Medicare Other | Source: Ambulatory Visit | Attending: Nurse Practitioner | Admitting: Nurse Practitioner

## 2022-01-03 ENCOUNTER — Inpatient Hospital Stay (HOSPITAL_COMMUNITY)
Admission: RE | Admit: 2022-01-03 | Discharge: 2022-01-06 | DRG: 309 | Disposition: A | Discharge status: Home / Self Care | Payer: Medicare Other | Source: Ambulatory Visit | Attending: Internal Medicine | Admitting: Internal Medicine

## 2022-01-03 VITALS — BP 136/52 | HR 61 | Ht 66.0 in | Wt 245.6 lb

## 2022-01-03 DIAGNOSIS — Z91048 Other nonmedicinal substance allergy status: Secondary | ICD-10-CM

## 2022-01-03 DIAGNOSIS — Z8041 Family history of malignant neoplasm of ovary: Secondary | ICD-10-CM

## 2022-01-03 DIAGNOSIS — G4733 Obstructive sleep apnea (adult) (pediatric): Secondary | ICD-10-CM | POA: Diagnosis present

## 2022-01-03 DIAGNOSIS — Z8542 Personal history of malignant neoplasm of other parts of uterus: Secondary | ICD-10-CM | POA: Diagnosis not present

## 2022-01-03 DIAGNOSIS — I4819 Other persistent atrial fibrillation: Principal | ICD-10-CM | POA: Diagnosis present

## 2022-01-03 DIAGNOSIS — I10 Essential (primary) hypertension: Secondary | ICD-10-CM | POA: Diagnosis not present

## 2022-01-03 DIAGNOSIS — Z87891 Personal history of nicotine dependence: Secondary | ICD-10-CM

## 2022-01-03 DIAGNOSIS — K589 Irritable bowel syndrome without diarrhea: Secondary | ICD-10-CM | POA: Diagnosis present

## 2022-01-03 DIAGNOSIS — D6869 Other thrombophilia: Secondary | ICD-10-CM

## 2022-01-03 DIAGNOSIS — E876 Hypokalemia: Secondary | ICD-10-CM | POA: Diagnosis not present

## 2022-01-03 DIAGNOSIS — Z96651 Presence of right artificial knee joint: Secondary | ICD-10-CM | POA: Diagnosis present

## 2022-01-03 DIAGNOSIS — E739 Lactose intolerance, unspecified: Secondary | ICD-10-CM | POA: Diagnosis present

## 2022-01-03 DIAGNOSIS — Z833 Family history of diabetes mellitus: Secondary | ICD-10-CM | POA: Diagnosis not present

## 2022-01-03 DIAGNOSIS — I480 Paroxysmal atrial fibrillation: Secondary | ICD-10-CM | POA: Diagnosis not present

## 2022-01-03 DIAGNOSIS — J329 Chronic sinusitis, unspecified: Secondary | ICD-10-CM | POA: Diagnosis present

## 2022-01-03 DIAGNOSIS — Z888 Allergy status to other drugs, medicaments and biological substances status: Secondary | ICD-10-CM

## 2022-01-03 DIAGNOSIS — Z800 Family history of malignant neoplasm of digestive organs: Secondary | ICD-10-CM | POA: Diagnosis not present

## 2022-01-03 DIAGNOSIS — Z7901 Long term (current) use of anticoagulants: Secondary | ICD-10-CM

## 2022-01-03 DIAGNOSIS — K219 Gastro-esophageal reflux disease without esophagitis: Secondary | ICD-10-CM | POA: Diagnosis present

## 2022-01-03 DIAGNOSIS — Z880 Allergy status to penicillin: Secondary | ICD-10-CM

## 2022-01-03 DIAGNOSIS — Z886 Allergy status to analgesic agent status: Secondary | ICD-10-CM

## 2022-01-03 DIAGNOSIS — E213 Hyperparathyroidism, unspecified: Secondary | ICD-10-CM | POA: Diagnosis not present

## 2022-01-03 DIAGNOSIS — Z950 Presence of cardiac pacemaker: Secondary | ICD-10-CM

## 2022-01-03 DIAGNOSIS — Z808 Family history of malignant neoplasm of other organs or systems: Secondary | ICD-10-CM

## 2022-01-03 DIAGNOSIS — Z79899 Other long term (current) drug therapy: Secondary | ICD-10-CM

## 2022-01-03 DIAGNOSIS — Z8249 Family history of ischemic heart disease and other diseases of the circulatory system: Secondary | ICD-10-CM

## 2022-01-03 DIAGNOSIS — Z96642 Presence of left artificial hip joint: Secondary | ICD-10-CM | POA: Diagnosis present

## 2022-01-03 DIAGNOSIS — E785 Hyperlipidemia, unspecified: Secondary | ICD-10-CM | POA: Diagnosis present

## 2022-01-03 DIAGNOSIS — Z803 Family history of malignant neoplasm of breast: Secondary | ICD-10-CM | POA: Diagnosis not present

## 2022-01-03 DIAGNOSIS — R7303 Prediabetes: Secondary | ICD-10-CM | POA: Diagnosis not present

## 2022-01-03 LAB — BASIC METABOLIC PANEL
Anion gap: 8 (ref 5–15)
Anion gap: 9 (ref 5–15)
BUN: 18 mg/dL (ref 8–23)
BUN: 17 mg/dL (ref 8–23)
CO2: 25 mmol/L (ref 22–32)
CO2: 25 mmol/L (ref 22–32)
Calcium: 10.2 mg/dL (ref 8.9–10.3)
Calcium: 10.1 mg/dL (ref 8.9–10.3)
Chloride: 108 mmol/L (ref 98–111)
Chloride: 107 mmol/L (ref 98–111)
Creatinine, Ser: 0.7 mg/dL (ref 0.44–1.00)
Creatinine, Ser: 0.67 mg/dL (ref 0.44–1.00)
GFR, Estimated: >60 mL/min (ref >60)
GFR, Estimated: >60 mL/min (ref >60)
Glucose, Bld: 87 mg/dL (ref 70–99)
Glucose, Bld: 86 mg/dL (ref 70–99)
Potassium: 3.8 mmol/L (ref 3.5–5.1)
Potassium: 3.9 mmol/L (ref 3.5–5.1)
Sodium: 141 mmol/L (ref 135–145)
Sodium: 141 mmol/L (ref 135–145)

## 2022-01-03 LAB — MAGNESIUM: Magnesium: 2.1 mg/dL (ref 1.7–2.4)

## 2022-01-03 MED ORDER — ACETAMINOPHEN 500 MG PO TABS
500.0000 mg | ORAL_TABLET | Freq: Four times a day (QID) | ORAL | Status: DC | PRN
  Administered 2022-01-03 – 2022-01-05 (×3): 500 mg via ORAL
  Filled 2022-01-03 (×3): qty 1

## 2022-01-03 MED ORDER — VITAMIN D 25 MCG (1000 UNIT) PO TABS
1000.0000 [IU] | ORAL_TABLET | Freq: Every morning | ORAL | Status: DC
  Administered 2022-01-04 – 2022-01-06 (×3): 1000 [IU] via ORAL
  Filled 2022-01-03 (×3): qty 1

## 2022-01-03 MED ORDER — DILTIAZEM HCL ER COATED BEADS 180 MG PO CP24
180.0000 mg | ORAL_CAPSULE | Freq: Every day | ORAL | Status: DC
  Administered 2022-01-03 – 2022-01-05 (×3): 180 mg via ORAL
  Filled 2022-01-03 (×3): qty 1

## 2022-01-03 MED ORDER — DOFETILIDE 500 MCG PO CAPS
500.0000 ug | ORAL_CAPSULE | Freq: Two times a day (BID) | ORAL | Status: DC
  Administered 2022-01-03: 500 ug via ORAL
  Filled 2022-01-03: qty 1

## 2022-01-03 MED ORDER — SODIUM CHLORIDE 0.9 % IV SOLN: 250.0000 mL | INTRAVENOUS | Status: DC | PRN

## 2022-01-03 MED ORDER — SODIUM CHLORIDE 0.9% FLUSH: 3.0000 mL | INTRAVENOUS | Status: DC | PRN

## 2022-01-03 MED ORDER — FLUTICASONE PROPIONATE 50 MCG/ACT NA SUSP
1.0000 | Freq: Every day | NASAL | Status: DC
  Administered 2022-01-04 – 2022-01-06 (×3): 1 via NASAL
  Filled 2022-01-03: qty 16

## 2022-01-03 MED ORDER — SODIUM CHLORIDE 0.9% FLUSH
3.0000 mL | Freq: Two times a day (BID) | INTRAVENOUS | Status: DC
  Administered 2022-01-04 – 2022-01-06 (×5): 3 mL via INTRAVENOUS

## 2022-01-03 MED ORDER — DILTIAZEM HCL 60 MG PO TABS: 30.0000 mg | ORAL_TABLET | Freq: Four times a day (QID) | ORAL | Status: DC | PRN

## 2022-01-03 MED ORDER — PRAVASTATIN SODIUM 40 MG PO TABS
40.0000 mg | ORAL_TABLET | Freq: Every day | ORAL | Status: DC
  Administered 2022-01-03 – 2022-01-05 (×3): 40 mg via ORAL
  Filled 2022-01-03 (×3): qty 1

## 2022-01-03 MED ORDER — POTASSIUM CHLORIDE CRYS ER 20 MEQ PO TBCR
40.0000 meq | EXTENDED_RELEASE_TABLET | ORAL | Status: AC
  Administered 2022-01-03: 40 meq via ORAL
  Filled 2022-01-03: qty 2

## 2022-01-03 MED ORDER — POTASSIUM CHLORIDE CRYS ER 20 MEQ PO TBCR: EXTENDED_RELEASE_TABLET | ORAL | Status: DC

## 2022-01-03 MED ORDER — FUROSEMIDE 40 MG PO TABS
40.0000 mg | ORAL_TABLET | Freq: Every day | ORAL | Status: DC | PRN
  Administered 2022-01-04 – 2022-01-06 (×2): 40 mg via ORAL
  Filled 2022-01-03 (×2): qty 1

## 2022-01-03 MED ORDER — IRBESARTAN 150 MG PO TABS
150.0000 mg | ORAL_TABLET | Freq: Every day | ORAL | Status: DC
  Administered 2022-01-03 – 2022-01-06 (×4): 150 mg via ORAL
  Filled 2022-01-03 (×4): qty 1

## 2022-01-03 MED ORDER — DILTIAZEM HCL ER COATED BEADS 180 MG PO CP24: 180.0000 mg | ORAL_CAPSULE | Freq: Every day | ORAL | Status: DC

## 2022-01-03 MED ORDER — MAGNESIUM OXIDE -MG SUPPLEMENT 400 (240 MG) MG PO TABS
400.0000 mg | ORAL_TABLET | Freq: Every day | ORAL | Status: DC
  Administered 2022-01-04 – 2022-01-06 (×3): 400 mg via ORAL
  Filled 2022-01-03 (×3): qty 1

## 2022-01-03 MED ORDER — RIVAROXABAN 20 MG PO TABS
20.0000 mg | ORAL_TABLET | Freq: Every day | ORAL | Status: DC
  Administered 2022-01-03 – 2022-01-05 (×3): 20 mg via ORAL
  Filled 2022-01-03 (×3): qty 1

## 2022-01-03 MED ORDER — POTASSIUM CHLORIDE CRYS ER 20 MEQ PO TBCR
20.0000 meq | EXTENDED_RELEASE_TABLET | Freq: Every day | ORAL | Status: DC
  Administered 2022-01-04 – 2022-01-06 (×3): 20 meq via ORAL
  Filled 2022-01-03 (×3): qty 1

## 2022-01-03 MED ORDER — ORAL CARE MOUTH RINSE: 15.0000 mL | OROMUCOSAL | Status: DC | PRN

## 2022-01-03 MED ORDER — FUROSEMIDE 40 MG PO TABS: 40.0000 mg | ORAL_TABLET | ORAL | Status: DC | PRN

## 2022-01-03 MED ORDER — PANTOPRAZOLE SODIUM 40 MG PO TBEC
40.0000 mg | DELAYED_RELEASE_TABLET | Freq: Two times a day (BID) | ORAL | Status: DC
  Administered 2022-01-03 – 2022-01-06 (×6): 40 mg via ORAL
  Filled 2022-01-03 (×6): qty 1

## 2022-01-03 NOTE — Progress Notes (Addendum)
Pharmacy: Dofetilide (Tikosyn) - Initial Consult Assessment and Electrolyte Replacement  Pharmacy consulted to assist in monitoring and replacing electrolytes in this 77 y.o. female admitted on 01/03/2022 undergoing dofetilide initiation. First dofetilide dose: 1/2 2300  Assessment:  Patient Exclusion Criteria: If any screening criteria checked as "Yes", then  patient  should NOT receive dofetilide until criteria item is corrected.  If "Yes" please indicate correction plan.  YES  NO Patient  Exclusion Criteria Correction Plan   '[x]'$   '[]'$   Baseline QTc interval is greater than or equal to 440 msec. IF above YES box checked dofetilide contraindicated unless patient has ICD; then may proceed if QTc 500-550 msec or with known ventricular conduction abnormalities may proceed with QTc 550-600 msec. QTc = 442 (MD aware)     '[]'$   '[x]'$   Patient is known or suspected to have a digoxin level greater than 2 ng/ml: No results found for: "DIGOXIN"     '[]'$   '[x]'$   Creatinine clearance less than 20 ml/min (calculated using Cockcroft-Gault, actual body weight and serum creatinine): Estimated Creatinine Clearance: 78 mL/min (by C-G formula based on SCr of 0.7 mg/dL).     '[]'$   '[x]'$  Patient has received drugs known to prolong the QT intervals within the last 48 hours (phenothiazines, tricyclics or tetracyclic antidepressants, erythromycin, H-1 antihistamines, cisapride, fluoroquinolones, azithromycin, ondansetron).  Flecainide stopped Fri 12/29  Updated information on QT prolonging agents is available to be searched on the following database:QT prolonging agents     '[]'$   '[x]'$   Patient received a dose of hydrochlorothiazide ([place]) alone or in any combination including triamterene (Dyazide, Maxzide) in the last 48 hours. HCTZ stopped Fri 12/29    '[]'$   '[x]'$  Patient received a medication known to increase dofetilide plasma concentrations prior to initial dofetilide dose:  Trimethoprim (Primsol, Proloprim) in  the last 36 hours Verapamil (Calan, Verelan) in the last 36 hours or a sustained release dose in the last 72 hours Megestrol (Megace) in the last 5 days  Cimetidine (Tagamet) in the last 6 hours Ketoconazole (Nizoral) in the last 24 hours Itraconazole (Sporanox) in the last 48 hours  Prochlorperazine (Compazine) in the last 36 hours     '[]'$   '[x]'$   Patient is known to have a history of torsades de pointes; congenital or acquired long QT syndromes.    '[]'$   '[x]'$   Patient has received a Class 1 antiarrhythmic with less than 2 half-lives since last dose. (Disopyramide, Quinidine, Procainamide, Lidocaine, Mexiletine, Flecainide, Propafenone) Flecainide stopped Fri 12/29    '[]'$   '[x]'$   Patient has received amiodarone therapy in the past 3 months or amiodarone level is greater than 0.3 ng/ml.    Labs:    Component Value Date/Time   K 3.8 01/03/2022 0955   MG 2.1 01/03/2022 0955     Plan: Select One Calculated CrCl  Dose q12h  '[x]'$  > 60 ml/min 500 mcg  '[]'$  40-60 ml/min 250 mcg  '[]'$  20-40 ml/min 125 mcg   '[x]'$   Physician selected initial dose within range recommended for patients level of renal function - will monitor for response.  '[]'$   Physician selected initial dose outside of range recommended for patients level of renal function - will discuss if the dose should be altered at this time.   Patient has been appropriately anticoagulated with Xarelto.  Potassium: K 3.8-3.9:  Hold Tikosyn initiation and give KCl 40 mEq po x1 then begin Tikosyn at least 2hr after KCl dose - do not need to recheck K  Magnesium: Mg >2: Appropriate to initiate Tikosyn, no replacement needed     Thank you for allowing pharmacy to participate in this patient's care   Sherlon Handing, PharmD, BCPS Please see amion for complete clinical pharmacist phone list 01/03/2022  4:33 PM

## 2022-01-03 NOTE — Progress Notes (Signed)
Primary Care Physician: Colon Branch, MD Referring Physician: Dr. Rayann Heman EP: Dr. Ludger Bradford is a 77 y.o. female with a h/o PPM, afib and PVC's that is in the afib clinic as she was in the ED waiting room and was not going to be seen by the provider for several hours so decided to leave  to be seen here this am for BP issues and afib.   She reports that she was in her usual state of health until last Friday when she had a tooth pulled. She had to hold her xarelto Thursday pm.  She is on xarelto 20 mg daily for a CHA2DS2VASc  score of at least 3.  She was started on clindamycin after the tooth extraction. Her xarelto was started back Friday pm. Her oral intake has been off since the tooth extraction. She was walking the dog Monday pm around 3:30 pm and noted that she felt off. She checked her heart rhythm and it was irregular with rates in the 80's. She described some chest heaviness on arrival to the ED to triage nurse but told me it has now resolved. Ekg in the ED showed afib with RBBB at 84 bpm.   Usually has  heart rates around 60's. She took extra cardizem and her heart rates for the most part have been less than 100 bpm. This am around 6:30 she awoke and took her BP. It was around 240/122. She rechecked with another BP cuff with similar results. She took her usual  am meds and called 911. By the time EMS responded her BP was 314 then 970 systolic and had normalized on arrival to the ER. She then got tired of the wait and asked to be seen this am as she had already made an appointment today for 9:30 a to discuss ongoing afib so checked herself out of the ED.labs form ED showed a k+ of 3.2 otherwise ok. HR irregular in the afib clinic in the 70's. Ekg was not repeated from  ED.  She was scheduled for a cardioversion and in a few days called in and she had returned to [place] so DCCV was cancelled. She then called the office early December again out of rhythm and discussion was had to admit pt  for tikosyn after the holidays. She is now here for admit and EKG today shows  SR. She did have afib briefly Christmas Eve but converted with an extra 120 mg cardizem. She is in rhythm today. She stopped flecainide and HCTZ last Friday. No benadryl use. No missed anticoagulation.    Today, she + for symptoms of palpitations, no current  chest pain, shortness of breath, orthopnea, PND, lower extremity edema, dizziness, presyncope, syncope, or neurologic sequela. The patient is tolerating medications without difficulties and is otherwise without complaint today.   Past Medical History:  Diagnosis Date   Atrial fibrillation [facility])    Atrial fibrillation ([place])    Back pain    Chronic sinusitis    s/p 2 surgeries remotely, [name]   Closed fracture of metatarsal of left foot    L foot, fifth metatarsal   Ectopic pregnancy    Endometrial cancer ([place]) 2004   Early diagnosis   GERD (gastroesophageal reflux disease)    Hyperlipemia    Hypertension    IBS (irritable bowel syndrome) 08/13/2014   Dx 2015, diarrhea on-off, Dr [name]    Lumbar radiculopathy 08/2012   MRI in 08/2012   Mild  hyperparathyroidism ([place])    Osteopenia    PAF (paroxysmal atrial fibrillation) ([facility])    Prediabetes    Sciatica of right side 08/2013   Received Depomedrol 80 mg injection   Sleep apnea 09/09/2013   Dx with OSA in 2015, by Dr. Caryl Comes, APAP   Past Surgical History:  Procedure Laterality Date   ATRIAL FIBRILLATION ABLATION N/A 09/04/2017   Procedure: [place];  Surgeon: [name], MD;  Location: [facility];  Service: Cardiovascular;  Laterality: N/A;   BIOPSY  03/11/2020   Procedure: BIOPSY;  Surgeon: [name], MD;  Location: [facility];  Service: Endoscopy;;  EGD and COLON   CARPAL TUNNEL RELEASE Right 06/17/2019   CARPAL TUNNEL RELEASE Left 11/2020   CHOLECYSTECTOMY  ~2006   COLONOSCOPY WITH PROPOFOL N/A 03/11/2020   Procedure: COLONOSCOPY WITH PROPOFOL;   Surgeon: [name], MD;  Location: [facility];  Service: Endoscopy;  Laterality: N/A;   DILATION AND CURETTAGE OF UTERUS     after SAB   ESOPHAGOGASTRODUODENOSCOPY  02/09/2014   Dr. 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/2020   Procedure: PACEMAKER IMPLANT;  Surgeon: [name], MD;  Location: [facility];  Service: Cardiovascular;  Laterality: N/A;   TONSILLECTOMY     TOTAL ABDOMINAL HYSTERECTOMY W/ BILATERAL SALPINGOOPHORECTOMY     TOTAL HIP ARTHROPLASTY Left 07/18/2021   Procedure: TOTAL HIP ARTHROPLASTY ANTERIOR APPROACH;  Surgeon: Rod Can, MD;  Location: [facility];  Service: Orthopedics;  Laterality: Left;   TOTAL KNEE ARTHROPLASTY Right ~2008    Current Outpatient Medications  Medication Sig Dispense Refill   acetaminophen (TYLENOL) 500 MG tablet Take 500 mg by mouth every 6 (six) hours as needed for moderate pain or headache.     ARTIFICIAL TEAR SOLUTION OP Place 1 drop into both eyes 4 (four) times daily as needed (dry/irritated eyes.).     cholecalciferol (VITAMIN D3) 25 MCG (1000 UNIT) tablet Take 1,000 Units by mouth in the morning.     Chromium Picolinate 1000 MCG TABS Take 500 mcg by mouth daily after supper.     clindamycin (CLEOCIN) 300 MG capsule TAKE 2 CAPSULES BY MOUTH 1 TO 2 HOURS BEFORE DENTAL WORK     Coenzyme Q10 (CO Q 10) 100 MG CAPS Take 1 capsule by mouth daily.     CRANBERRY EXTRACT PO Take 2 tablets by mouth at bedtime.     Cyanocobalamin (VITAMIN B 12 PO) Take 1 tablet by mouth daily.     diltiazem (CARDIZEM CD) 180 MG 24 hr capsule Take 1 capsule (180 mg total) by mouth daily. 90 capsule 2   diltiazem (TIAZAC) 120 MG 24 hr capsule Take  120 mg by mouth as needed. Taking as needed for A-fib episodes     estradiol (ESTRACE) 0.1 MG/GM vaginal cream Place 1 Applicatorful vaginally 3 (three) times a week.     fluticasone (FLONASE) 50 MCG/ACT nasal spray Place 1 spray into both nostrils daily.     FOLIC ACID PO Take 341 mcg by mouth in the morning.     HYDROcodone-acetaminophen (NORCO) 10-325 MG tablet Take 1 tablet by mouth every 8 (eight) hours as needed.  magnesium oxide (MAG-OX) 400 (240 Mg) MG tablet Take 400 mg by mouth daily.     Olopatadine HCl (PATADAY OP) Place 1 drop into both eyes daily as needed.     Omega-3 Fatty Acids (FISH OIL PO) Take 1 tablet by mouth every evening.     pantoprazole (PROTONIX) 40 MG tablet Take 1 tablet (40 mg total) by mouth 2 (two) times daily.     pravastatin (PRAVACHOL) 40 MG tablet Take 1 tablet (40 mg total) by mouth at bedtime. 90 tablet 1   Probiotic Product (PROBIOTIC DAILY) CAPS Take 1 tablet by mouth daily.     sucralfate (CARAFATE) 1 g tablet Take 1 g by mouth in the morning, at noon, and at bedtime.     valsartan (DIOVAN) 320 MG tablet Take 160 mg by mouth 2 (two) times daily.     XARELTO 20 MG TABS tablet TAKE 1 TABLET BY MOUTH EVERY DAY WITH SUPPER 90 tablet 3   diltiazem (CARDIZEM) 30 MG tablet Take 1 tablet every 4 hours AS NEEDED for heart rate >100 as long as blood pressure >100. (Patient not taking: Reported on 01/03/2022) 45 tablet 1   furosemide (LASIX) 40 MG tablet Take 1 tablet (40 mg total) by mouth as needed.     potassium chloride SA (KLOR-CON M20) 20 MEQ tablet Take one tablet by mouth daily as needed for Lasix     psyllium (REGULOID) 0.52 g capsule Take 2 capsules by mouth daily. (Patient not taking: Reported on 01/03/2022)     No current facility-administered medications for this encounter.    Allergies  Allergen Reactions   Ibuprofen Other (See Comments)    Pt is on Xarelto   Lactose Intolerance (Gi) Diarrhea and Nausea And Vomiting   Lisinopril Cough   Nsaids  Other (See Comments)    Pt on Xarelto   Tape     Causes blisters    Tolmetin Other (See Comments)    Pt on Xarelto   Zoster Vaccine Live Other (See Comments)    Serum sickness, high fever, neck pain    Amoxicillin Rash   Penicillins Rash    Local reaction    Social History   Socioeconomic History   Marital status: Married    Spouse name: Not on file   Number of children: 1   Years of education: Not on file   Highest education level: Not on file  Occupational History   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  Tobacco Use   Smoking status: Former    Types: Cigarettes    Quit date: 01/06/1980    Years since quitting: 42.0   Smokeless tobacco: Never   Tobacco comments:    smoked from [place] to 1982, less than 1 ppd  Vaping Use   Vaping Use: Never used  Substance and Sexual Activity   Alcohol use: Yes    Comment: rare   Drug use: No   Sexual activity: Not Currently    Birth control/protection: None  Other Topics Concern   Not on file  Social History Narrative   Lives w/ husband, and son [name]   Retired Marine scientist   Lives in [place] Strain: [place]  (06/08/2020)   Overall Financial Resource Strain ([place])    Difficulty of Paying Living Expenses: Not hard at all  Food Insecurity: No [place] (06/08/2020)   Hunger Vital Sign    Worried About Running Out of Food in the Last Year:  Never true    Ran Out of Food in the Last Year: Never true  Transportation Needs: No Transportation Needs (06/08/2020)   PRAPARE - Hydrologist (Medical): No    Lack of Transportation (Non-Medical): No  Physical Activity: Sufficiently Active (06/08/2020)   Exercise Vital Sign    Days of Exercise per Week: 7 days    Minutes of Exercise per Session: 30 min  Stress: No Stress Concern Present (06/08/2020)   [place]    Feeling of Stress : Not at all   Social Connections: Moderately Integrated (06/08/2020)   Social Connection and Isolation Panel [NHANES]    Frequency of Communication with Friends and Family: More than three times a week    Frequency of Social Gatherings with Friends and Family: Once a week    Attends Religious Services: Never    Marine scientist or Organizations: Yes    Attends Music therapist: More than 4 times per year    Marital Status: Married  Human resources officer Violence: Not At Risk (06/08/2020)   Humiliation, Afraid, Rape, and Kick questionnaire    Fear of Current or Ex-Partner: No    Emotionally Abused: No    Physically Abused: No    Sexually Abused: No    Family History  Problem Relation Age of Onset   Hypertension Mother    Colon cancer Mother    Breast cancer Mother    Ovarian cancer Mother    Hypertension Father    Diabetes Father    Head & neck cancer Sister    CAD Neg Hx     ROS- All systems are reviewed and negative except as per the HPI above  Physical Exam: Vitals:   01/03/22 0955  BP: (!) 136/52  Pulse: 61  Weight: 111.4 kg  Height: '5\' 6"'$  (1.676 m)   Wt Readings from Last 3 Encounters:  01/03/22 111.4 kg  12/16/21 108.8 kg  11/01/21 109.3 kg    Labs: Lab Results  Component Value Date   NA 142 11/01/2021   K 3.6 11/01/2021   CL 102 11/01/2021   CO2 32 11/01/2021   GLUCOSE 80 11/01/2021   BUN 18 11/01/2021   CREATININE 0.61 11/01/2021   CALCIUM 10.8 (H) 11/01/2021   MG 2.0 07/21/2021   Lab Results  Component Value Date   INR 1.2 07/14/2021   Lab Results  Component Value Date   CHOL 173 03/23/2021   HDL 69.40 03/23/2021   LDLCALC 89 03/23/2021   TRIG 75.0 03/23/2021     GEN- The patient is well appearing, alert and oriented x 3 today.   Head- normocephalic, atraumatic Eyes-  Sclera clear, conjunctiva pink Ears- hearing intact Oropharynx- clear Neck- supple, no JVP Lymph- no cervical lymphadenopathy Lungs- Clear to ausculation bilaterally,  normal work of breathing Heart- regular rate and irregular  Rhythm(pvc's), no murmurs, rubs or gallops, PMI not laterally displaced GI- soft, NT, ND, + BS Extremities- no clubbing, cyanosis, or edema MS- no significant deformity or atrophy Skin- no rash or lesion Psych- euthymic mood, full affect Neuro- strength and sensation are intact  EKG-  probable  sinus rhythm at 61 bpm, qrs int 160 ms, qtc 442 ms  Epic  records reviewed   02/11/21- Echo-1. Left ventricular ejection fraction, by estimation, is 65 to 70%. The  left ventricle has normal function. The left ventricle has no regional  wall motion abnormalities. There is moderate asymmetric  left ventricular  hypertrophy of the basal-septal  segment. Left ventricular diastolic parameters are indeterminate.   2. Right ventricular systolic function is normal. The right ventricular  size is mildly enlarged. There is normal pulmonary artery systolic  pressure. The estimated right ventricular systolic pressure is 09.8 mmHg.   3. The mitral valve is normal in structure. Trivial mitral valve  regurgitation. No evidence of mitral stenosis.   4. The aortic valve is tricuspid. Aortic valve regurgitation is not  visualized. No aortic stenosis is present.   5. The inferior vena cava is normal in size with greater than 50%  respiratory variability, suggesting right atrial pressure of 3 mmHg.    Assessment and Plan: 1. Paroxysmal afib  Increased afib burden and pt wants to purse Tikosyn admit  Will be admitted  later today when bed Is available  Bmet/mag  with ok mag and a K+ at 3.8.  She was notified to take an extra 20 meq of K prior to admit and will need another bmet drawn at time of admission No benadryl use Stopped HCTZ and flecainide last Friday  Qtc 442 ms   2. CHA2DS2VASc score of 3 Continue xarelto 20 mg daily States no missed doses   3. HTN Stable Has been taking valsartan 160 mg bid since stopping HCTZ  To 6 E when bed is  available   Butch Penny C. Kendel Pesnell, [facility] [address] [place], [place] [address] [phone]

## 2022-01-03 NOTE — Progress Notes (Signed)
Valsartan ordered, home med. Mag Ox held, HCTZ held, defer to EP team for further changes.

## 2022-01-03 NOTE — H&P (Signed)
Primary Care Physician: Veronica Branch, MD Referring Physician: Dr. Rayann Heman EP: Dr. Ludger Bradford is a 77 y.o. female with a h/o PPM, afib and PVC's that is in the afib clinic as she was in the ED waiting room and was not going to be seen by the provider for several hours so decided to leave  to be seen here this am for BP issues and afib.   She reports that she was in her usual state of health until last Friday when she had a tooth pulled. She had to hold her xarelto Thursday pm.  She is on xarelto 20 mg daily for a CHA2DS2VASc  score of at least 3.  She was started on clindamycin after the tooth extraction. Her xarelto was started back Friday pm. Her oral intake has been off since the tooth extraction. She was walking the dog Monday pm around 3:30 pm and noted that she felt off. She checked her heart rhythm and it was irregular with rates in the 80's. She described some chest heaviness on arrival to the ED to triage nurse but told me it has now resolved. Ekg in the ED showed afib with RBBB at 84 bpm.   Usually has  heart rates around 60's. She took extra cardizem and her heart rates for the most part have been less than 100 bpm. This am around 6:30 she awoke and took her BP. It was around 240/122. She rechecked with another BP cuff with similar results. She took her usual  am meds and called 911. By the time EMS responded her BP was 330 then 076 systolic and had normalized on arrival to the ER. She then got tired of the wait and asked to be seen this am as she had already made an appointment today for 9:30 a to discuss ongoing afib so checked herself out of the ED.labs form ED showed a k+ of 3.2 otherwise ok. HR irregular in the afib clinic in the 70's. Ekg was not repeated from  ED.  She was scheduled for a cardioversion and in a few days called in and she had returned to [place] so DCCV was cancelled. She then called the office early December again out of rhythm and discussion was had to admit pt  for tikosyn after the holidays. She is now here for admit and EKG today shows  SR. She did have afib briefly Christmas Eve but converted with an extra 120 mg cardizem. She is in rhythm today. She stopped flecainide and HCTZ last Friday. No benadryl use. No missed anticoagulation.    Today, she + for symptoms of palpitations, no current  chest pain, shortness of breath, orthopnea, PND, lower extremity edema, dizziness, presyncope, syncope, or neurologic sequela. The patient is tolerating medications without difficulties and is otherwise without complaint today.   Past Medical History:  Diagnosis Date   Atrial fibrillation [facility])    Atrial fibrillation ([place])    Back pain    Chronic sinusitis    s/p 2 surgeries remotely, [name]   Closed fracture of metatarsal of left foot    L foot, fifth metatarsal   Ectopic pregnancy    Endometrial cancer ([place]) 2004   Early diagnosis   GERD (gastroesophageal reflux disease)    Hyperlipemia    Hypertension    IBS (irritable bowel syndrome) 08/13/2014   Dx 2015, diarrhea on-off, Dr [name]    Lumbar radiculopathy 08/2012   MRI in 08/2012   Mild  hyperparathyroidism ([place])    Osteopenia    PAF (paroxysmal atrial fibrillation) (HCC)    Prediabetes    Sciatica of right side 08/2013   Received Depomedrol 80 mg injection   Sleep apnea 09/09/2013   Dx with OSA in 2015, by Dr. Caryl Comes, APAP   Past Surgical History:  Procedure Laterality Date   ATRIAL FIBRILLATION ABLATION N/A 09/04/2017   Procedure: [place];  Surgeon: [name], MD;  Location: [place] [facility];  Service: Cardiovascular;  Laterality: N/A;   BIOPSY  03/11/2020   Procedure: BIOPSY;  Surgeon: [name], MD;  Location: [facility];  Service: Endoscopy;;  EGD and Veronica   CARPAL TUNNEL RELEASE Right 06/17/2019   CARPAL TUNNEL RELEASE Left 11/2020   CHOLECYSTECTOMY  ~2006   COLONOSCOPY WITH PROPOFOL N/A 03/11/2020   Procedure: COLONOSCOPY WITH PROPOFOL;   Surgeon: [name], MD;  Location: [facility];  Service: Endoscopy;  Laterality: N/A;   DILATION AND CURETTAGE OF UTERUS     after SAB   ESOPHAGOGASTRODUODENOSCOPY  02/09/2014   Dr. 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/2020   Procedure: PACEMAKER IMPLANT;  Surgeon: [name], MD;  Location: [facility];  Service: Cardiovascular;  Laterality: N/A;   TONSILLECTOMY     TOTAL ABDOMINAL HYSTERECTOMY W/ BILATERAL SALPINGOOPHORECTOMY     TOTAL HIP ARTHROPLASTY Left 07/18/2021   Procedure: TOTAL HIP ARTHROPLASTY ANTERIOR APPROACH;  Surgeon: Rod Can, MD;  Location: [facility];  Service: Orthopedics;  Laterality: Left;   TOTAL KNEE ARTHROPLASTY Right ~2008    No current facility-administered medications for this encounter.   Current Outpatient Medications  Medication Sig Dispense Refill   acetaminophen (TYLENOL) 500 MG tablet Take 500 mg by mouth every 6 (six) hours as needed for moderate pain or headache.     ARTIFICIAL TEAR SOLUTION OP Place 1 drop into both eyes 4 (four) times daily as needed (dry/irritated eyes.).     cholecalciferol (VITAMIN D3) 25 MCG (1000 UNIT) tablet Take 1,000 Units by mouth in the morning.     Chromium Picolinate 1000 MCG TABS Take 500 mcg by mouth daily after supper.     clindamycin (CLEOCIN) 300 MG capsule TAKE 2 CAPSULES BY MOUTH 1 TO 2 HOURS BEFORE DENTAL WORK     Coenzyme Q10 (CO Q 10) 100 MG CAPS Take 1 capsule by mouth daily.     CRANBERRY EXTRACT PO Take 2 tablets by mouth at bedtime.     Cyanocobalamin (VITAMIN B 12 PO) Take 1 tablet by mouth daily.     diltiazem (CARDIZEM CD) 180 MG 24 hr capsule Take 1 capsule (180 mg total) by mouth  daily. 90 capsule 2   diltiazem (CARDIZEM) 30 MG tablet Take 1 tablet every 4 hours AS NEEDED for heart rate >100 as long as blood pressure >100. (Patient not taking: Reported on 01/03/2022) 45 tablet 1   diltiazem (TIAZAC) 120 MG 24 hr capsule Take 120 mg by mouth as needed. Taking as needed for A-fib episodes     estradiol (ESTRACE) 0.1 MG/GM vaginal cream Place 1 Applicatorful vaginally 3 (three) times a week.     fluticasone (FLONASE) 50 MCG/ACT nasal spray Place 1 spray  into both nostrils daily.     FOLIC ACID PO Take 034 mcg by mouth in the morning.     furosemide (LASIX) 40 MG tablet Take 1 tablet (40 mg total) by mouth as needed.     magnesium oxide (MAG-OX) 400 (240 Mg) MG tablet Take 400 mg by mouth daily.     Olopatadine HCl (PATADAY OP) Place 1 drop into both eyes daily as needed.     Omega-3 Fatty Acids (FISH OIL PO) Take 1 tablet by mouth every evening.     pantoprazole (PROTONIX) 40 MG tablet Take 1 tablet (40 mg total) by mouth 2 (two) times daily.     potassium chloride SA (KLOR-CON M20) 20 MEQ tablet Take one tablet by mouth daily as needed for Lasix     pravastatin (PRAVACHOL) 40 MG tablet Take 1 tablet (40 mg total) by mouth at bedtime. 90 tablet 1   Probiotic Product (PROBIOTIC DAILY) CAPS Take 1 tablet by mouth daily.     psyllium (REGULOID) 0.52 g capsule Take 2 capsules by mouth daily. (Patient not taking: Reported on 01/03/2022)     sucralfate (CARAFATE) 1 g tablet Take 1 g by mouth in the morning, at noon, and at bedtime.     valsartan (DIOVAN) 320 MG tablet Take 160 mg by mouth 2 (two) times daily.     XARELTO 20 MG TABS tablet TAKE 1 TABLET BY MOUTH EVERY DAY WITH SUPPER 90 tablet 3    Allergies  Allergen Reactions   Ibuprofen Other (See Comments)    Pt is on Xarelto   Lactose Intolerance (Gi) Diarrhea and Nausea And Vomiting   Lisinopril Cough   Nsaids Other (See Comments)    Pt on Xarelto   Tape     Causes blisters    Tolmetin Other (See Comments)    Pt on  Xarelto   Zoster Vaccine Live Other (See Comments)    Serum sickness, high fever, neck pain    Amoxicillin Rash   Penicillins Rash    Local reaction    Social History   Socioeconomic History   Marital status: Married    Spouse name: Not on file   Number of children: 1   Years of education: Not on file   Highest education level: Not on file  Occupational History   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  Tobacco Use   Smoking status: Former    Types: Cigarettes    Quit date: 01/06/1980    Years since quitting: 42.0   Smokeless tobacco: Never   Tobacco comments:    smoked from [place] to 1982, less than 1 ppd  Vaping Use   Vaping Use: Never used  Substance and Sexual Activity   Alcohol use: Yes    Comment: rare   Drug use: No   Sexual activity: Not Currently    Birth control/protection: None  Other Topics Concern   Not on file  Social History Narrative   Lives w/ husband, and son Veronica Bradford   Retired Marine scientist   Lives in [place] Strain: [place]  (06/08/2020)   Overall Financial Resource Strain (CARDIA)    Difficulty of Paying Living Expenses: Not hard at all  Food Insecurity: No [place] (06/08/2020)   Hunger Vital Sign    Worried About Running Out of Food in the Last Year: Never true    Ran Out of Food in the Last Year: Never true  Transportation Needs: No Transportation  Needs (06/08/2020)   PRAPARE - Hydrologist (Medical): No    Lack of Transportation (Non-Medical): No  Physical Activity: Sufficiently Active (06/08/2020)   Exercise Vital Sign    Days of Exercise per Week: 7 days    Minutes of Exercise per Session: 30 min  Stress: No Stress Concern Present (06/08/2020)   [place]    Feeling of Stress : Not at all  Social Connections: Moderately Integrated (06/08/2020)   Social Connection and Isolation Panel [NHANES]    Frequency  of Communication with Friends and Family: More than three times a week    Frequency of Social Gatherings with Friends and Family: Once a week    Attends Religious Services: Never    Marine scientist or Organizations: Yes    Attends Music therapist: More than 4 times per year    Marital Status: Married  Human resources officer Violence: Not At Risk (06/08/2020)   Humiliation, Afraid, Rape, and Kick questionnaire    Fear of Current or Ex-Partner: No    Emotionally Abused: No    Physically Abused: No    Sexually Abused: No    Family History  Problem Relation Age of Onset   Hypertension Mother    Veronica cancer Mother    Breast cancer Mother    Ovarian cancer Mother    Hypertension Father    Diabetes Father    Head & neck cancer Sister    CAD Neg Hx     ROS- All systems are reviewed and negative except as per the HPI above  Physical Exam: There were no vitals filed for this visit.  Wt Readings from Last 3 Encounters:  01/03/22 111.4 kg  12/16/21 108.8 kg  11/01/21 109.3 kg    Labs: Lab Results  Component Value Date   NA 141 01/03/2022   K 3.8 01/03/2022   CL 108 01/03/2022   CO2 25 01/03/2022   GLUCOSE 87 01/03/2022   BUN 18 01/03/2022   CREATININE 0.70 01/03/2022   CALCIUM 10.2 01/03/2022   MG 2.1 01/03/2022   Lab Results  Component Value Date   INR 1.2 07/14/2021   Lab Results  Component Value Date   CHOL 173 03/23/2021   HDL 69.40 03/23/2021   LDLCALC 89 03/23/2021   TRIG 75.0 03/23/2021     GEN- The patient is well appearing, alert and oriented x 3 today.   Head- normocephalic, atraumatic Eyes-  Sclera clear, conjunctiva pink Ears- hearing intact Oropharynx- clear Neck- supple, no JVP Lymph- no cervical lymphadenopathy Lungs- Clear to ausculation bilaterally, normal work of breathing Heart- regular rate and irregular  Rhythm(pvc's), no murmurs, rubs or gallops, PMI not laterally displaced GI- soft, NT, ND, + BS Extremities- no  clubbing, cyanosis, or edema MS- no significant deformity or atrophy Skin- no rash or lesion Psych- euthymic mood, full affect Neuro- strength and sensation are intact  EKG-  probable  sinus rhythm at 61 bpm, qrs int 160 ms, qtc 442 ms  Epic  records reviewed   02/11/21- Echo-1. Left ventricular ejection fraction, by estimation, is 65 to 70%. The  left ventricle has normal function. The left ventricle has no regional  wall motion abnormalities. There is moderate asymmetric left ventricular  hypertrophy of the basal-septal  segment. Left ventricular diastolic parameters are indeterminate.   2. Right ventricular systolic function is normal. The right ventricular  size is mildly enlarged. There is normal pulmonary  artery systolic  pressure. The estimated right ventricular systolic pressure is 88.9 mmHg.   3. The mitral valve is normal in structure. Trivial mitral valve  regurgitation. No evidence of mitral stenosis.   4. The aortic valve is tricuspid. Aortic valve regurgitation is not  visualized. No aortic stenosis is present.   5. The inferior vena cava is normal in size with greater than 50%  respiratory variability, suggesting right atrial pressure of 3 mmHg.    Assessment and Plan: 1. Paroxysmal afib  Increased afib burden and pt wants to purse Tikosyn admit  Veronica Bradford be admitted  later today when bed Is available  Bmet/mag  with ok mag and a K+ at 3.8.  She was notified to take an extra 20 meq of K prior to admit and Veronica Bradford need another bmet drawn at time of admission No benadryl use Stopped HCTZ and flecainide last Friday  Qtc 442 ms   2. CHA2DS2VASc score of 3 Continue xarelto 20 mg daily States no missed doses   3. HTN Stable Has been taking valsartan 160 mg bid since stopping HCTZ  Pt presents to 6E for tikosyn loading as Veronica Salvo, PA-C  01/03/2022 3:55 PM      I have seen and examined this patient with [name].  Agree with above, note added  to reflect my findings.  Patient has a history of paroxysmal atrial fibrillation.  Has been having episodes that are more prolonged.  She feels weak and fatigued during her episodes.  She presented to the hospital for dofetilide load.  GEN: Well nourished, well developed, in no acute distress  HEENT: normal  Neck: no JVD, carotid bruits, or masses Cardiac: RRR; no murmurs, rubs, or gallops,no edema  Respiratory:  clear to auscultation bilaterally, normal work of breathing GI: soft, nontender, nondistended, + BS MS: no deformity or atrophy  Skin: warm and dry, device site well healed Neuro:  Strength and sensation are intact Psych: euthymic mood, full affect   Paroxysmal atrial fibrillation: Patient in sinus rhythm.  Plan for dofetilide load during this admission.  QTc is remained stable.  Potassium mildly reduced on labs this morning.  She is taken extra potassium throughout the day today.  Veronica Bradford plan for dofetilide starting tonight.  ECG after each dose. Secondary hypercoagulable state: Currently on Xarelto for atrial fibrillation as above.  CHA2DS2-VASc of 3. Hypertension: Currently well-controlled  Veronica Bradford M. [place] Lesure MD 01/03/2022 5:38 PM

## 2022-01-04 ENCOUNTER — Other Ambulatory Visit (HOSPITAL_COMMUNITY): Payer: Self-pay

## 2022-01-04 DIAGNOSIS — I480 Paroxysmal atrial fibrillation: Secondary | ICD-10-CM | POA: Diagnosis not present

## 2022-01-04 LAB — BASIC METABOLIC PANEL
Anion gap: 9 (ref 5–15)
BUN: 17 mg/dL (ref 8–23)
CO2: 26 mmol/L (ref 22–32)
Calcium: 10 mg/dL (ref 8.9–10.3)
Chloride: 105 mmol/L (ref 98–111)
Creatinine, Ser: 0.98 mg/dL (ref 0.44–1.00)
GFR, Estimated: >60 mL/min (ref >60)
Glucose, Bld: 105 mg/dL — ABNORMAL HIGH (ref 70–99)
Potassium: 4.5 mmol/L (ref 3.5–5.1)
Sodium: 140 mmol/L (ref 135–145)

## 2022-01-04 LAB — MAGNESIUM: Magnesium: 2.3 mg/dL (ref 1.7–2.4)

## 2022-01-04 MED ORDER — DOFETILIDE 250 MCG PO CAPS
250.0000 ug | ORAL_CAPSULE | Freq: Two times a day (BID) | ORAL | Status: DC
  Administered 2022-01-04 – 2022-01-06 (×4): 250 ug via ORAL
  Filled 2022-01-04 (×4): qty 1

## 2022-01-04 MED ORDER — DOFETILIDE 500 MCG PO CAPS
500.0000 ug | ORAL_CAPSULE | Freq: Two times a day (BID) | ORAL | Status: DC
  Administered 2022-01-04: 500 ug via ORAL
  Filled 2022-01-04: qty 1

## 2022-01-04 MED ORDER — SUCRALFATE 1 G PO TABS
1.0000 g | ORAL_TABLET | Freq: Two times a day (BID) | ORAL | Status: DC
  Administered 2022-01-04 – 2022-01-05 (×2): 1 g via ORAL
  Filled 2022-01-04 (×3): qty 1

## 2022-01-04 NOTE — Progress Notes (Addendum)
Electrophysiology Rounding Note  Patient Name: Veronica Bradford Date of Encounter: 01/04/2022  Primary Cardiologist: Virl Axe, MD  Electrophysiologist: None    Subjective   Pt remains in NSR on Tikosyn 500 mcg BID   QTc from EKG last pm shows stable QTc at ~460  The patient is doing well today.  At this time, the patient denies chest pain, shortness of breath, or any new concerns.  Inpatient Medications    Scheduled Meds:  cholecalciferol  1,000 Units Oral q AM   diltiazem  180 mg Oral QHS   dofetilide  500 mcg Oral BID   fluticasone  1 spray Each Nare Daily   irbesartan  150 mg Oral Daily   magnesium oxide  400 mg Oral Daily   pantoprazole  40 mg Oral BID   potassium chloride SA  20 mEq Oral Daily   pravastatin  40 mg Oral QHS   rivaroxaban  20 mg Oral Q supper   sodium chloride flush  3 mL Intravenous Q12H   Continuous Infusions:  sodium chloride     PRN Meds: sodium chloride, acetaminophen, diltiazem, furosemide, mouth rinse, sodium chloride flush   Vital Signs    Vitals:   01/03/22 1638 01/03/22 1640 01/03/22 2339 01/04/22 0455  BP:  (!) 147/80 (!) 130/53 (!) 113/48  Pulse:  68  60  Resp:  16  18  Temp:  97.8 F (36.6 C) 98.4 F (36.9 C) 98 F (36.7 C)  TempSrc:  Oral Oral Oral  SpO2:  100% 100% 96%  Weight: 110.9 kg     Height: '5\' 6"'$  (1.676 m)       Intake/Output Summary (Last 24 hours) at 01/04/2022 0707 Last data filed at 01/03/2022 2100 Gross per 24 hour  Intake 480 ml  Output --  Net 480 ml   Filed Weights   01/03/22 1638  Weight: 110.9 kg    Physical Exam    GEN- The patient is well appearing, alert and oriented x 3 today.   Head- normocephalic, atraumatic Eyes-  Sclera clear, conjunctiva pink Ears- hearing intact Oropharynx- clear Neck- supple Lungs- Clear to ausculation bilaterally, normal work of breathing Heart- Regular rate and rhythm, no murmurs, rubs or gallops GI- soft, NT, ND, + BS Extremities- no clubbing, cyanosis, or  edema Skin- no rash or lesion Psych- euthymic mood, full affect Neuro- strength and sensation are intact  Labs    CBC No results for input(s): "WBC", "NEUTROABS", "HGB", "HCT", "MCV", "PLT" in the last 72 hours. Basic Metabolic Panel Recent Labs    01/03/22 0955 01/03/22 1720 01/04/22 0155  NA 141 141 140  K 3.8 3.9 4.5  CL 108 107 105  CO2 '25 25 26  '$ GLUCOSE 87 86 105*  BUN '18 17 17  '$ CREATININE 0.70 0.67 0.98  CALCIUM 10.2 10.1 10.0  MG 2.1  --  2.3    Potassium  Date/Time Value Ref Range Status  01/04/2022 01:55 AM 4.5 3.5 - 5.1 mmol/L Final   Magnesium  Date/Time Value Ref Range Status  01/04/2022 01:55 AM 2.3 1.7 - 2.4 mg/dL Final    Comment:    Performed at [facility], [place] [address], [place], [place] [address]    Telemetry    AP-VS 60-70s (personally reviewed)  Radiology    No results found.   Patient Profile     Veronica Bradford is a 77 y.o. female with a past medical history significant for persistent atrial fibrillation.  They were  admitted for tikosyn load.   Assessment & Plan    Persistent atrial fibrillation Pt remains in NSR on Tikosyn 500 mcg BID  Continue Xarelto Creatinine, ser  0.98 (01/03 0155) Magnesium  2.3 (01/03 0155) Potassium4.5 (01/03 0155) No electrolyte supplementation needed  Plan for home Friday if QTc remains stable.  For questions or updates, please contact [place] Please consult [email or web address] for contact info under Cardiology/STEMI.  Signed, Veronica Friar, PA-C  01/04/2022, 7:07 AM   I have seen and examined this patient with Veronica Bradford.  Agree with above, note added to reflect my findings.  Remains in sinus rhythm.  No acute complaints.  GEN: Well nourished, well developed, in no acute distress  HEENT: normal  Neck: no JVD, carotid bruits, or masses Cardiac: RRR; no murmurs, rubs, or gallops,no edema  Respiratory:  clear to auscultation bilaterally, normal work of breathing GI: soft,  nontender, nondistended, + BS MS: no deformity or atrophy  Skin: warm and dry, device site well healed Neuro:  Strength and sensation are intact Psych: euthymic mood, full affect   Paroxysmal atrial fibrillation: Remains in sinus rhythm.  Continue dofetilide 500 mcg twice daily.  QTc is remained stable.  Potassium magnesium within normal limits.  Continue with current management. Hypertension: Currently well-controlled.  Veronica Prestigiacomo M. Neiman Roots MD 01/04/2022 7:56 AM

## 2022-01-04 NOTE — Progress Notes (Addendum)
Pharmacy: Dofetilide (Tikosyn) - Follow-up Consult Assessment and Electrolyte Replacement  Pharmacy consulted to assist in monitoring and replacing electrolytes in this 77 y.o. female admitted on 01/03/2022 undergoing dofetilide initiation. First dofetilide dose: 1/2 at 2300.  Assessment:  Patient Exclusion Criteria: If any screening criteria checked as "Yes", then  patient  should NOT receive dofetilide until criteria item is corrected.  If "Yes" please indicate correction plan.  YES  NO Patient  Exclusion Criteria Correction Plan   '[x]'$   '[]'$   Baseline QTc interval is greater than or equal to 440 msec. IF above YES box checked dofetilide contraindicated unless patient has ICD; then may proceed if QTc 500-550 msec or with known ventricular conduction abnormalities may proceed with QTc 550-600 msec. QTc = 466 (MD aware)0.43    '[]'$   '[x]'$   Patient is known or suspected to have a digoxin level greater than 2 ng/ml: No results found for: "DIGOXIN"     '[]'$   '[x]'$   Creatinine clearance less than 20 ml/min (calculated using Cockcroft-Gault, actual body weight and serum creatinine): Estimated Creatinine Clearance: 63.5 mL/min (by C-G formula based on SCr of 0.98 mg/dL).     '[]'$   '[x]'$  Patient has received drugs known to prolong the QT intervals within the last 48 hours (phenothiazines, tricyclics or tetracyclic antidepressants, erythromycin, H-1 antihistamines, cisapride, fluoroquinolones, azithromycin, ondansetron).  Flecainide stopped Fri 12/29  Updated information on QT prolonging agents is available to be searched on the following database:QT prolonging agents     '[]'$   '[x]'$   Patient received a dose of hydrochlorothiazide (Oretic) alone or in any combination including triamterene (Dyazide, Maxzide) in the last 48 hours. HCTZ stopped Fri 12/29    '[]'$   '[x]'$  Patient received a medication known to increase dofetilide plasma concentrations prior to initial dofetilide dose:  Trimethoprim (Primsol,  Proloprim) in the last 36 hours Verapamil (Calan, Verelan) in the last 36 hours or a sustained release dose in the last 72 hours Megestrol (Megace) in the last 5 days  Cimetidine (Tagamet) in the last 6 hours Ketoconazole (Nizoral) in the last 24 hours Itraconazole (Sporanox) in the last 48 hours  Prochlorperazine (Compazine) in the last 36 hours     '[]'$   '[x]'$   Patient is known to have a history of torsades de pointes; congenital or acquired long QT syndromes.    '[]'$   '[x]'$   Patient has received a Class 1 antiarrhythmic with less than 2 half-lives since last dose. (Disopyramide, Quinidine, Procainamide, Lidocaine, Mexiletine, Flecainide, Propafenone) Flecainide stopped Fri 12/29    '[]'$   '[x]'$   Patient has received amiodarone therapy in the past 3 months or amiodarone level is greater than 0.3 ng/ml.    Labs:    Component Value Date/Time   K 4.5 01/04/2022 0155   MG 2.3 01/04/2022 0155     Plan: Select One Calculated CrCl  Dose q12h  '[x]'$  > 60 ml/min 500 mcg  '[]'$  40-60 ml/min 250 mcg  '[]'$  20-40 ml/min 125 mcg   '[x]'$   Physician selected initial dose within range recommended for patients level of renal function - will monitor for response.  '[]'$   Physician selected initial dose outside of range recommended for patients level of renal function - will discuss if the dose should be altered at this time.   Patient has been appropriately anticoagulated with Xarelto.  Potassium: K >/= 4: Appropriate to initiate Tikosyn, no replacement needed    Magnesium: Mg >2: Appropriate to initiate Tikosyn, no replacement needed    Thank you for allowing pharmacy  to participate in this patient's care.  Reatha Harps, PharmD PGY2 Pharmacy Resident 01/04/2022 7:09 AM Check [email or web address] for unit specific pharmacy number  ADDENDUM:  Afternoon EKG showed Qtc >500. Spoke with EP team, dose will be reduced to 250 mg BID.  Thank you for allowing pharmacy to participate in this patient's care.  Reatha Harps,  PharmD PGY2 Pharmacy Resident 01/04/2022 12:34 PM Check [email or web address] for unit specific pharmacy number

## 2022-01-04 NOTE — Progress Notes (Signed)
Morning EKG reviewed     Shows remains in NSR at 60 bpm with prolonged QTc at ~510 ms.  Decrease Tikosyn to 250 mcg BID for tonights dose.   Potassium4.5 (01/03 0155) Magnesium  2.3 (01/03 0155) Creatinine, ser  0.98 (01/03 0155)  Plan for home Friday if QTc remains stable   Shirley Friar, [place]  01/04/2022 12:33 PM

## 2022-01-04 NOTE — Discharge Instructions (Addendum)
For dental prophylaxis in the future please use cephalexin 2 grams once 30-60 minutes before dental procedures to avoid drug interactions with Tikosyn. Clindamycin does not have drug interactions with Tikosyn and may be used as an alternative agent if cephalexin is not tolerated.

## 2022-01-04 NOTE — TOC Benefit Eligibility Note (Signed)
Patient Teacher, English as a foreign language completed.    The patient is currently admitted and upon discharge could be taking dofetilide (Tikosyn) 500 mcg capsules.  The current 30 day co-pay is $46.66.   The patient is insured through [place], [place] Patient Advocate Specialist [place] Patient Advocate Team Direct Number: [phone]  Fax: [phone]

## 2022-01-04 NOTE — Care Management (Addendum)
  Transition of Care [facility]) Screening Note   Patient Details  Name: Veronica Bradford Date of Birth: 03/18/1945   Transition of Care [facility]) CM/SW Contact:    Bethena Roys, RN Phone Number: 01/04/2022, 9:06 AM    Transition of Care Department [facility]) has reviewed the patient. Patient presented for Tikosyn Load-Benefits check submitted for cost. Case Manager will follow for cost and pharmacy of choice as the patient progresses.   01-04-22 1222 Case Manager spoke with patient regarding cost. Patient wants to use [facility]. Initial Rx to be filled via [facility] and Rx refills 90 day supply escribed to [facility]. No further needs identified at this time.

## 2022-01-05 DIAGNOSIS — I480 Paroxysmal atrial fibrillation: Secondary | ICD-10-CM | POA: Diagnosis not present

## 2022-01-05 LAB — BASIC METABOLIC PANEL
Anion gap: 6 (ref 5–15)
BUN: 17 mg/dL (ref 8–23)
CO2: 26 mmol/L (ref 22–32)
Calcium: 9.5 mg/dL (ref 8.9–10.3)
Chloride: 105 mmol/L (ref 98–111)
Creatinine, Ser: 0.71 mg/dL (ref 0.44–1.00)
GFR, Estimated: >60 mL/min (ref >60)
Glucose, Bld: 118 mg/dL — ABNORMAL HIGH (ref 70–99)
Potassium: 3.8 mmol/L (ref 3.5–5.1)
Sodium: 137 mmol/L (ref 135–145)

## 2022-01-05 LAB — MAGNESIUM: Magnesium: 2.2 mg/dL (ref 1.7–2.4)

## 2022-01-05 MED ORDER — SUCRALFATE 1 G PO TABS
1.0000 g | ORAL_TABLET | Freq: Three times a day (TID) | ORAL | Status: DC | PRN
  Administered 2022-01-05: 1 g via ORAL
  Filled 2022-01-05: qty 1

## 2022-01-05 MED ORDER — POTASSIUM CHLORIDE CRYS ER 20 MEQ PO TBCR
40.0000 meq | EXTENDED_RELEASE_TABLET | Freq: Once | ORAL | Status: AC
  Administered 2022-01-05: 40 meq via ORAL
  Filled 2022-01-05: qty 2

## 2022-01-05 MED ORDER — SPIRONOLACTONE 25 MG PO TABS
25.0000 mg | ORAL_TABLET | Freq: Every day | ORAL | Status: DC
  Administered 2022-01-05 – 2022-01-06 (×2): 25 mg via ORAL
  Filled 2022-01-05 (×2): qty 1

## 2022-01-05 NOTE — Progress Notes (Signed)
Pharmacy: Dofetilide (Tikosyn) - Follow Up Assessment and Electrolyte Replacement  Pharmacy consulted to assist in monitoring and replacing electrolytes in this 77 y.o. female admitted on 01/03/2022 undergoing dofetilide initiation. First dofetilide dose: 01/04/20  Labs:    Component Value Date/Time   K 3.8 01/05/2022 0210   MG 2.2 01/05/2022 0210     Plan: Potassium: K 3.8-3.9:  Give KCl 40 mEq po x1   Magnesium: Mg > 2: No additional supplementation needed   Arrie Senate, PharmD, BCPS, [facility] Clinical Pharmacist [phone] Please check AMION for all [facility] Pharmacy numbers 01/05/2022

## 2022-01-05 NOTE — Progress Notes (Addendum)
Electrophysiology Rounding Note  Patient Name: Veronica Bradford Date of Encounter: 01/05/2022  Primary Cardiologist: Virl Axe, MD  Electrophysiologist: None    Subjective   Pt remains in NSR on Tikosyn 250 mcg BID   QTc from EKG last pm shows stable QTc at ~460  The patient is doing well today.  At this time, the patient denies chest pain, shortness of breath, or any new concerns.  Inpatient Medications    Scheduled Meds:  cholecalciferol  1,000 Units Oral q AM   diltiazem  180 mg Oral QHS   dofetilide  250 mcg Oral BID   fluticasone  1 spray Each Nare Daily   irbesartan  150 mg Oral Daily   magnesium oxide  400 mg Oral Daily   pantoprazole  40 mg Oral BID   potassium chloride SA  20 mEq Oral Daily   potassium chloride  40 mEq Oral Once   pravastatin  40 mg Oral QHS   rivaroxaban  20 mg Oral Q supper   sodium chloride flush  3 mL Intravenous Q12H   spironolactone  25 mg Oral Daily   sucralfate  1 g Oral BID WC   Continuous Infusions:  sodium chloride     PRN Meds: sodium chloride, acetaminophen, diltiazem, furosemide, mouth rinse, sodium chloride flush   Vital Signs    Vitals:   01/04/22 1228 01/04/22 1626 01/04/22 2032 01/05/22 0602  BP: (!) 143/61 (!) 114/47 (!) 127/57 (!) 152/74  Pulse: 60 62 66 60  Resp: '18 18 18 17  '$ Temp: 97.7 F (36.5 C) 98.4 F (36.9 C) 98.7 F (37.1 C) 98.1 F (36.7 C)  TempSrc: Oral Oral Oral Oral  SpO2: 100% 100% 96% 100%  Weight:      Height:       No intake or output data in the 24 hours ending 01/05/22 0811 Filed Weights   01/03/22 1638  Weight: 110.9 kg    Physical Exam    GEN- The patient is well appearing, alert and oriented x 3 today.   Head- normocephalic, atraumatic Eyes-  Sclera clear, conjunctiva pink Ears- hearing intact Oropharynx- clear Neck- supple Lungs- Clear to ausculation bilaterally, normal work of breathing Heart- Regular rate and rhythm, no murmurs, rubs or gallops GI- soft, NT, ND, +  BS Extremities- no clubbing, cyanosis, or edema Skin- no rash or lesion Psych- euthymic mood, full affect Neuro- strength and sensation are intact  Labs    CBC No results for input(s): "WBC", "NEUTROABS", "HGB", "HCT", "MCV", "PLT" in the last 72 hours. Basic Metabolic Panel Recent Labs    01/04/22 0155 01/05/22 0210  NA 140 137  K 4.5 3.8  CL 105 105  CO2 26 26  GLUCOSE 105* 118*  BUN 17 17  CREATININE 0.98 0.71  CALCIUM 10.0 9.5  MG 2.3 2.2    Potassium  Date/Time Value Ref Range Status  01/05/2022 02:10 AM 3.8 3.5 - 5.1 mmol/L Final   Magnesium  Date/Time Value Ref Range Status  01/05/2022 02:10 AM 2.2 1.7 - 2.4 mg/dL Final    Comment:    Performed at [facility], [place] [address] [place], [place] [address]    Telemetry    AP VS 60s (personally reviewed)  Radiology    No results found.   Patient Profile     Veronica Bradford is a 77 y.o. female with a past medical history significant for persistent atrial fibrillation.  They were admitted for tikosyn load.  Assessment & Plan    Persistent atrial fibrillation Pt remains in NSR on Tikosyn 250 mcg BID  Continue Xarelto Creatinine, ser  0.71 (01/04 0210) Magnesium  2.2 (01/04 0210) Potassium3.8 (01/04 0210) Supplement K  2. Hypokalemia Mild. With cessation of Triamterene, Veronica Bradford add spiro  Plan for home Friday if QTc remains stable.   For questions or updates, please contact [place] Please consult [email or web address] for contact info under Cardiology/STEMI.  Signed, Shirley Friar, PA-C  01/05/2022, 8:11 AM   I have seen and examined this patient with Veronica Bradford.  Agree with above, note added to reflect my findings.  Remains in sinus rhythm.  QTc remained stable.  Feeling well.  GEN: Well nourished, well developed, in no acute distress  HEENT: normal  Neck: no JVD, carotid bruits, or masses Cardiac: RRR; no murmurs, rubs, or gallops,no edema  Respiratory:  clear to auscultation  bilaterally, normal work of breathing GI: soft, nontender, nondistended, + BS MS: no deformity or atrophy  Skin: warm and dry Neuro:  Strength and sensation are intact Psych: euthymic mood, full affect   Persistent atrial fibrillation: Currently on dofetilide.  QTc was prolonged and dose was reduced to 250 mcg twice daily.  Has remained in sinus rhythm.  Veronica Bradford continue with current management.  Veronica Bradford M. Veronica Salvato MD 01/05/2022 1:17 PM

## 2022-01-05 NOTE — Progress Notes (Signed)
Morning EKG reviewed     Shows remains in NSR at 61 bpm with stable QTc at ~460 ms when measured manually and corrected for QRS  Continue  Tikosyn 250 mcg BID.   Potassium3.8 (01/04 0210) Magnesium  2.2 (01/04 0210) Creatinine, ser  0.71 (01/04 0210)  Plan for home Friday if QTc remains stable   Shirley Friar, [place]  01/05/2022 12:15 PM

## 2022-01-06 ENCOUNTER — Other Ambulatory Visit (HOSPITAL_COMMUNITY): Payer: Self-pay

## 2022-01-06 DIAGNOSIS — I480 Paroxysmal atrial fibrillation: Secondary | ICD-10-CM | POA: Diagnosis not present

## 2022-01-06 LAB — BASIC METABOLIC PANEL
Anion gap: 4 — ABNORMAL LOW (ref 5–15)
BUN: 17 mg/dL (ref 8–23)
CO2: 25 mmol/L (ref 22–32)
Calcium: 9.6 mg/dL (ref 8.9–10.3)
Chloride: 108 mmol/L (ref 98–111)
Creatinine, Ser: 0.83 mg/dL (ref 0.44–1.00)
GFR, Estimated: >60 mL/min (ref >60)
Glucose, Bld: 113 mg/dL — ABNORMAL HIGH (ref 70–99)
Potassium: 4.4 mmol/L (ref 3.5–5.1)
Sodium: 137 mmol/L (ref 135–145)

## 2022-01-06 LAB — MAGNESIUM: Magnesium: 2.2 mg/dL (ref 1.7–2.4)

## 2022-01-06 MED ORDER — SPIRONOLACTONE 25 MG PO TABS
25.0000 mg | ORAL_TABLET | Freq: Every day | ORAL | 6 refills | Status: DC
  Filled 2022-01-06: qty 30, 30d supply, fill #0

## 2022-01-06 MED ORDER — CEPHALEXIN 500 MG PO CAPS
2000.0000 mg | ORAL_CAPSULE | Freq: Once | ORAL | 0 refills | Status: DC | PRN
  Filled 2022-01-06: qty 4, 1d supply, fill #0

## 2022-01-06 MED ORDER — DOFETILIDE 250 MCG PO CAPS
250.0000 ug | ORAL_CAPSULE | Freq: Two times a day (BID) | ORAL | 6 refills | Status: DC
  Filled 2022-01-06: qty 60, 30d supply, fill #0

## 2022-01-06 NOTE — Progress Notes (Addendum)
Pharmacy: Dofetilide (Tikosyn) - Follow Up Assessment and Electrolyte Replacement  Pharmacy consulted to assist in monitoring and replacing electrolytes in this 77 y.o. female admitted on 01/03/2022 undergoing dofetilide initiation. First dofetilide dose: 01/03/22  Labs:    Component Value Date/Time   K 4.4 01/06/2022 0324   MG 2.2 01/06/2022 0324     Plan: Potassium: K >/= 4: No additional supplementation needed  Magnesium: Mg > 2: No additional supplementation needed   As patient has required on average 46 mEq of potassium replacement every day, recommend discharging patient with prescription for:  Continue 20 mEq daily given new addition of spironolactone 1/4  Thank you for allowing pharmacy to participate in this patient's care   Arrie Senate, PharmD, BCPS, [place] Pharmacist [phone] Please check AMION for all [place] numbers 01/06/2022

## 2022-01-06 NOTE — Discharge Summary (Addendum)
ELECTROPHYSIOLOGY PROCEDURE DISCHARGE SUMMARY    Patient ID: Veronica Bradford,  MRN: [id number], DOB/AGE: February 13, 1945 77 y.o.  Admit date: 01/03/2022 Discharge date: 01/06/2022  Primary Care Physician: Colon Branch, MD  Primary Cardiologist: Virl Axe, MD  Electrophysiologist: Dr. Caryl Comes   Primary Discharge Diagnosis:  1.  Paroxysmal atrial fibrillation status post Tikosyn loading this admission  Secondary Discharge Diagnosis:  2. Hypokalemia  Allergies  Allergen Reactions   Ibuprofen Other (See Comments)    Pt is on Xarelto   Lactose Intolerance (Gi) Diarrhea and Nausea And Vomiting   Lisinopril Cough   Nsaids Other (See Comments)    Pt on Xarelto   Tape     Causes blisters    Tolmetin Other (See Comments)    Pt on Xarelto   Zoster Vaccine Live Other (See Comments)    Serum sickness, high fever, neck pain    Amoxicillin Rash   Penicillins Rash    Local reaction     Procedures This Admission:  1.  Tikosyn loading   Brief HPI: LABRISHA Bradford is a 77 y.o. female with a past medical history as noted above.  They were referred to EP for treatment options of atrial fibrillation.  Risks, benefits, and alternatives to Tikosyn were reviewed with the patient who wished to proceed with admission for loading.  Hospital Course:  The patient was admitted and Tikosyn was initiated.  Renal function and electrolytes were followed during the hospitalization.  Their QTc remained stable. The patient presented in sinus rhythm and did not require cardioversion. The patients QT prolonged, requiring dose reduction to 250 mcg BID. They were monitored on telemetry up to discharge. On the day of discharge, they were examined by Dr. Curt Bears  who considered them stable for discharge to home.  Follow-up has been arranged with the Atrial Fibrillation clinic in approximately 1 week.   Pt was given prescription of cephalexin 2 g for dental prophylaxis for procedure next week.   Physical  Exam: Vitals:   01/05/22 2100 01/06/22 0557 01/06/22 0900 01/06/22 0913  BP: (!) 129/49 (!) 126/49  (!) 126/48  Pulse: 64 60    Resp: 18 16    Temp:  97.7 F (36.5 C) (!) 97.5 F (36.4 C)   TempSrc:  Oral Oral   SpO2: 98% 100%    Weight:      Height:        GEN- The patient is well appearing, alert and oriented x 3 today.   HEENT: normocephalic, atraumatic; sclera clear, conjunctiva pink; hearing intact; oropharynx clear; neck supple, no JVP Lymph- no cervical lymphadenopathy Lungs- Clear to ausculation bilaterally, normal work of breathing.  No wheezes, rales, rhonchi Heart- Regular rate and rhythm, no murmurs, rubs or gallops, PMI not laterally displaced GI- soft, non-tender, non-distended, bowel sounds present, no hepatosplenomegaly Extremities- no clubbing, cyanosis, or edema; DP/PT/radial pulses 2+ bilaterally MS- no significant deformity or atrophy Skin- warm and dry, no rash or lesion Psych- euthymic mood, full affect Neuro- strength and sensation are intact  Labs:   Lab Results  Component Value Date   WBC 5.8 11/01/2021   HGB 12.5 11/01/2021   HCT 38.3 11/01/2021   MCV 87.1 11/01/2021   PLT 278.0 11/01/2021    Recent Labs  Lab 01/06/22 0324  NA 137  K 4.4  CL 108  CO2 25  BUN 17  CREATININE 0.83  CALCIUM 9.6  GLUCOSE 113*    Discharge Medications:  Allergies as of 01/06/2022  Reactions   Ibuprofen Other (See Comments)   Pt is on Xarelto   Lactose Intolerance (gi) Diarrhea, Nausea And Vomiting   Lisinopril Cough   Nsaids Other (See Comments)   Pt on Xarelto   Tape    Causes blisters   Tolmetin Other (See Comments)   Pt on Xarelto   Zoster Vaccine Live Other (See Comments)   Serum sickness, high fever, neck pain    Amoxicillin Rash   Penicillins Rash   Local reaction        Medication List     STOP taking these medications    clindamycin 300 MG capsule Commonly known as: CLEOCIN   diltiazem 180 MG 24 hr capsule Commonly known  as: CARDIZEM CD   potassium chloride SA 20 MEQ tablet Commonly known as: Klor-Con M20       TAKE these medications    acetaminophen 500 MG tablet Commonly known as: TYLENOL Take 500 mg by mouth every 6 (six) hours as needed for moderate pain or headache.   ARTIFICIAL TEAR SOLUTION OP Place 1 drop into both eyes 4 (four) times daily as needed (dry/irritated eyes.).   cephALEXin 500 MG capsule Commonly known as: KEFLEX Take 4 capsules (2,000 mg total) by mouth once as needed for up to 1 dose (30-60 minutes prior to dental procedures).   cholecalciferol 25 MCG (1000 UNIT) tablet Commonly known as: VITAMIN D3 Take 1,000 Units by mouth in the morning.   Chromium Picolinate 1000 MCG Tabs Take 500 mcg by mouth daily after supper.   Co Q 10 100 MG Caps Take 1 capsule by mouth daily.   CRANBERRY EXTRACT PO Take 2 tablets by mouth at bedtime.   diltiazem 120 MG 24 hr capsule Commonly known as: TIAZAC Take 120 mg by mouth as needed (Afib). Taking as needed for A-fib episodes. Take after taking diltiazem '30mg'$  q4h at daily dose instead of diltiazem '180mg'$  dose.   diltiazem 30 MG tablet Commonly known as: Cardizem Take 1 tablet every 4 hours AS NEEDED for heart rate >100 as long as blood pressure >100. What changed:  how much to take how to take this when to take this additional instructions   dofetilide 250 MCG capsule Commonly known as: TIKOSYN Take 1 capsule (250 mcg total) by mouth 2 (two) times daily.   estradiol 0.1 MG/GM vaginal cream Commonly known as: ESTRACE Place 1 Applicatorful vaginally 3 (three) times a week.   FISH OIL PO Take 1 tablet by mouth every evening.   fluticasone 50 MCG/ACT nasal spray Commonly known as: FLONASE Place 1 spray into both nostrils daily.   FOLIC ACID PO Take 127 mcg by mouth in the morning.   furosemide 40 MG tablet Commonly known as: LASIX Take 1 tablet (40 mg total) by mouth as needed.   magnesium oxide 400 (240 Mg) MG  tablet Commonly known as: MAG-OX Take 400 mg by mouth daily.   pantoprazole 40 MG tablet Commonly known as: PROTONIX Take 1 tablet (40 mg total) by mouth 2 (two) times daily.   PATADAY OP Place 1 drop into both eyes daily as needed.   pravastatin 40 MG tablet Commonly known as: PRAVACHOL Take 1 tablet (40 mg total) by mouth at bedtime.   Probiotic Daily Caps Take 1 tablet by mouth daily.   psyllium 0.52 g capsule Commonly known as: REGULOID Take 2 capsules by mouth daily.   PSYLLIUM FIBER PO Take 1 Dose by mouth daily.   spironolactone 25 MG tablet Commonly  known as: ALDACTONE Take 1 tablet (25 mg total) by mouth daily. Start taking on: January 07, 2022   sucralfate 1 g tablet Commonly known as: CARAFATE Take 1 g by mouth 2 (two) times daily.   valsartan 320 MG tablet Commonly known as: DIOVAN Take 160 mg by mouth 2 (two) times daily.   VITAMIN B 12 PO Take 1 tablet by mouth daily.   Xarelto 20 MG Tabs tablet Generic drug: rivaroxaban TAKE 1 TABLET BY MOUTH EVERY DAY WITH SUPPER What changed: See the new instructions.        Disposition:    Follow-up Information     [place] [facility] DEPT OF [place] Follow up.   Why: on 1/15 at 3 pm for post hospital follow up Contact information: [place] [id number] [phone]                Duration of Discharge Encounter: Greater than 30 minutes including physician time.  Signed, Shirley Friar, PA-C  01/06/2022 11:42 AM    I have seen and examined this patient with [name].  Agree with above, note added to reflect my findings.  Patient admitted to the hospital for dofetilide load.  QTc prolonged on 500 mcg twice daily and thus dose was reduced to 250 mcg twice daily.  Tolerated this dose.  Had no episodes of atrial fibrillation while in the hospital.  Plan for discharge today with follow-up in clinic.  GEN: Well  nourished, well developed, in no acute distress  HEENT: normal  Neck: no JVD, carotid bruits, or masses Cardiac: RRR; no murmurs, rubs, or gallops,no edema  Respiratory:  clear to auscultation bilaterally, normal work of breathing GI: soft, nontender, nondistended, + BS MS: no deformity or atrophy  Skin: warm and dry, device site well healed Neuro:  Strength and sensation are intact Psych: euthymic mood, full affect     Graylyn Bunney M. Catalyna Reilly MD 01/06/2022 3:54 PM

## 2022-01-06 NOTE — Progress Notes (Signed)
EKG from yesterday evening 01/05/2022 reviewed     Shows remains in NSR at ~60 bpm with stable QTc at ~460 ms, even better when corrected further for QRS.  Continue  Tikosyn 250 mcg BID.   Potassium4.4 (01/05 0324) Magnesium  2.2 (01/05 0324) Creatinine, ser  0.83 (01/05 0324)  Plan for home this afternoon if QTc remains stable.   Shirley Friar, PA-C  01/06/2022 7:07 AM

## 2022-01-06 NOTE — Care Management Important Message (Signed)
Important Message  Patient Details  Name: VIANCA BRACHER MRN: [id number] Date of Birth: August 04, 1945   Medicare Important Message Given:  Yes     Shelda Altes 01/06/2022, 9:56 AM

## 2022-01-09 ENCOUNTER — Telehealth: Payer: Self-pay | Admitting: *Deleted

## 2022-01-09 NOTE — Patient Outreach (Signed)
  Care Coordination [facility] Note Transition Care Management Follow-up Telephone Call Date of discharge and from where: [facility] [phone] A fib How have you been since you were released from the hospital? Doing pretty good. My HR is regular. I have had some dizziness when I stand. My by has dropped to 104-109. I have had to make some adjustments with my valsartan. I have had a little nausea Any questions or concerns? No  Items Reviewed: Did the pt receive and understand the discharge instructions provided? Yes  Medications obtained and verified? Yes  Other? No  Any new allergies since your discharge? No  Dietary orders reviewed? Yes low sodium heart healthy. Patient monitors sodium intake closely.  Do you have support at home? Yes   Home Care and Equipment/Supplies: Were home health services ordered? no If so, what is the name of the agency? N  Has the agency set up a time to come to the patient's home? not applicable Were any new equipment or medical supplies ordered?  No What is the name of the medical supply agency? N  Were you able to get the supplies/equipment? no Do you have any questions related to the use of the equipment or supplies? No  Functional Questionnaire: (I = Independent and D = Dependent) ADLs: I  Bathing/Dressing- I  Meal Prep- I  Eating- I  Maintaining continence- I  Transferring/Ambulation- I  Managing Meds- I  Follow up appointments reviewed:  PCP Hospital f/u appt confirmed? No   Specialist Hospital f/u appt confirmed? Yes  [place] [phone] 3 PM. Are transportation arrangements needed? No  If their condition worsens, is the pt aware to call PCP or go to the Emergency Dept.? Yes Was the patient provided with contact information for the PCP's office or ED? Yes Was to pt encouraged to call back with questions or concerns? Yes  SDOH assessments and interventions completed:   Yes SDOH Interventions Today    Flowsheet Row Most Recent Value   SDOH Interventions   Food Insecurity Interventions Intervention Not Indicated  Housing Interventions Intervention Not Indicated  Transportation Interventions Intervention Not Indicated       Care Coordination Interventions:  Rn had patient to check BP sitting and standing. Patient will check Lying and standing and document  BP and wt and take to PCP. If symptoms worsen or any changes will contact cardiologist immediately. Patient will make Cardiologist aware of the medication changes. RN discussed low sodium diet.    Encounter Outcome:  Pt. Visit Completed    [place] Management [phone]

## 2022-01-10 ENCOUNTER — Other Ambulatory Visit (HOSPITAL_COMMUNITY): Payer: Self-pay

## 2022-01-10 DIAGNOSIS — M546 Pain in thoracic spine: Secondary | ICD-10-CM | POA: Diagnosis not present

## 2022-01-10 DIAGNOSIS — M6281 Muscle weakness (generalized): Secondary | ICD-10-CM | POA: Diagnosis not present

## 2022-01-10 DIAGNOSIS — M2569 Stiffness of other specified joint, not elsewhere classified: Secondary | ICD-10-CM | POA: Diagnosis not present

## 2022-01-10 DIAGNOSIS — Z9181 History of falling: Secondary | ICD-10-CM | POA: Diagnosis not present

## 2022-01-11 NOTE — Progress Notes (Signed)
Remote pacemaker transmission.   

## 2022-01-16 ENCOUNTER — Encounter: Payer: Self-pay | Admitting: Internal Medicine

## 2022-01-16 ENCOUNTER — Ambulatory Visit (HOSPITAL_COMMUNITY)
Admission: RE | Admit: 2022-01-16 | Discharge: 2022-01-16 | Disposition: A | Discharge status: Home / Self Care | Payer: Medicare Other | Source: Ambulatory Visit | Attending: Nurse Practitioner | Admitting: Nurse Practitioner

## 2022-01-16 VITALS — BP 132/54 | HR 60 | Ht 66.0 in | Wt 244.4 lb

## 2022-01-16 DIAGNOSIS — Z8249 Family history of ischemic heart disease and other diseases of the circulatory system: Secondary | ICD-10-CM | POA: Insufficient documentation

## 2022-01-16 DIAGNOSIS — Z87891 Personal history of nicotine dependence: Secondary | ICD-10-CM | POA: Insufficient documentation

## 2022-01-16 DIAGNOSIS — D6869 Other thrombophilia: Secondary | ICD-10-CM

## 2022-01-16 DIAGNOSIS — I10 Essential (primary) hypertension: Secondary | ICD-10-CM | POA: Insufficient documentation

## 2022-01-16 DIAGNOSIS — I480 Paroxysmal atrial fibrillation: Secondary | ICD-10-CM | POA: Diagnosis not present

## 2022-01-16 DIAGNOSIS — Z79899 Other long term (current) drug therapy: Secondary | ICD-10-CM | POA: Diagnosis not present

## 2022-01-16 DIAGNOSIS — Z950 Presence of cardiac pacemaker: Secondary | ICD-10-CM | POA: Diagnosis not present

## 2022-01-16 DIAGNOSIS — Z7901 Long term (current) use of anticoagulants: Secondary | ICD-10-CM | POA: Diagnosis not present

## 2022-01-16 LAB — BASIC METABOLIC PANEL
Anion gap: 9 (ref 5–15)
BUN: 14 mg/dL (ref 8–23)
CO2: 27 mmol/L (ref 22–32)
Calcium: 10.3 mg/dL (ref 8.9–10.3)
Chloride: 103 mmol/L (ref 98–111)
Creatinine, Ser: 0.67 mg/dL (ref 0.44–1.00)
GFR, Estimated: >60 mL/min (ref >60)
Glucose, Bld: 106 mg/dL — ABNORMAL HIGH (ref 70–99)
Potassium: 3.6 mmol/L (ref 3.5–5.1)
Sodium: 139 mmol/L (ref 135–145)

## 2022-01-16 LAB — MAGNESIUM: Magnesium: 2.3 mg/dL (ref 1.7–2.4)

## 2022-01-16 MED ORDER — DOFETILIDE 250 MCG PO CAPS: 250.0000 ug | ORAL_CAPSULE | Freq: Two times a day (BID) | ORAL | 1 refills | Status: DC

## 2022-01-16 NOTE — Progress Notes (Signed)
Primary Care Physician: Colon Branch, MD Referring Physician: Dr. Rayann Heman EP: Dr. Ludger Nutting is a 77 y.o. female with a h/o PPM, afib and PVC's that is in the afib clinic as she was in the ED waiting room and was not going to be seen by the provider for several hours so decided to leave  to be seen here this am for BP issues and afib.   She reports that she was in her usual state of health until last Friday when she had a tooth pulled. She had to hold her xarelto Thursday pm.  She is on xarelto 20 mg daily for a CHA2DS2VASc  score of at least 3.  She was started on clindamycin after the tooth extraction. Her xarelto was started back Friday pm. Her oral intake has been off since the tooth extraction. She was walking the dog Monday pm around 3:30 pm and noted that she felt off. She checked her heart rhythm and it was irregular with rates in the 80's. She described some chest heaviness on arrival to the ED to triage nurse but told me it has now resolved. Ekg in the ED showed afib with RBBB at 84 bpm.   Usually has  heart rates around 60's. She took extra cardizem and her heart rates for the most part have been less than 100 bpm. This am around 6:30 she awoke and took her BP. It was around 240/122. She rechecked with another BP cuff with similar results. She took her usual  am meds and called 911. By the time EMS responded her BP was 106 then 269 systolic and had normalized on arrival to the ER. She then got tired of the wait and asked to be seen this am as she had already made an appointment today for 9:30 a to discuss ongoing afib so checked herself out of the ED.labs form ED showed a k+ of 3.2 otherwise ok. HR irregular in the afib clinic in the 70's. Ekg was not repeated from  ED.  She was scheduled for a cardioversion and in a few days called in and she had returned to [place] so DCCV was cancelled. She then called the office early December again out of rhythm and discussion was had to admit pt  for tikosyn after the holidays. She is now here for admit and EKG today shows  SR. She did have afib briefly Christmas Eve but converted with an extra 120 mg cardizem. She is in rhythm today. She stopped flecainide and HCTZ last Friday. No benadryl use. No missed anticoagulation.   F/u in afib clinic, 01/16/22 for post hospital  Tikosyn surveillance. She has been staying in [place]. Her qt interval is stable at 462 ms. She was placed on spironolactone as it was difficult  keeping her K+ at good levels in the hospital. She has still required some doses of lasix when her weight jumps up a few lbs in a day or two. She has had to reduce her valsartan to 160 mg daily as her BP has been softer since d/c.   Today, she - for symptoms of palpitations, no current  chest pain, shortness of breath, orthopnea, PND, lower extremity edema, dizziness, presyncope, syncope, or neurologic sequela. The patient is tolerating medications without difficulties and is otherwise without complaint today.   Past Medical History:  Diagnosis Date   Atrial fibrillation [facility])    Atrial fibrillation (HCC)    Back pain    Chronic  sinusitis    s/p 2 surgeries remotely, [name]   Closed fracture of metatarsal of left foot    L foot, fifth metatarsal   Ectopic pregnancy    Endometrial cancer ([place]) 2004   Early diagnosis   GERD (gastroesophageal reflux disease)    Hyperlipemia    Hypertension    IBS (irritable bowel syndrome) 08/13/2014   Dx 2015, diarrhea on-off, Dr [name]    Lumbar radiculopathy 08/2012   MRI in 08/2012   Mild hyperparathyroidism ([place])    Osteopenia    PAF (paroxysmal atrial fibrillation) (HCC)    Prediabetes    Sciatica of right side 08/2013   Received Depomedrol 80 mg injection   Sleep apnea 09/09/2013   Dx with OSA in 2015, by Dr. Caryl Comes, APAP   Past Surgical History:  Procedure Laterality Date   ATRIAL FIBRILLATION ABLATION N/A 09/04/2017   Procedure: [place];  Surgeon: [name], MD;  Location: [place] [facility];  Service: Cardiovascular;  Laterality: N/A;   BIOPSY  03/11/2020   Procedure: BIOPSY;  Surgeon: [name], MD;  Location: [facility];  Service: Endoscopy;;  EGD and COLON   CARPAL TUNNEL RELEASE Right 06/17/2019   CARPAL TUNNEL RELEASE Left 11/2020   CHOLECYSTECTOMY  ~2006   COLONOSCOPY WITH PROPOFOL N/A 03/11/2020   Procedure: COLONOSCOPY WITH PROPOFOL;  Surgeon: [name], MD;  Location: [facility];  Service: Endoscopy;  Laterality: N/A;   DILATION AND CURETTAGE OF UTERUS     after SAB   ESOPHAGOGASTRODUODENOSCOPY  02/09/2014   Dr. 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/2020   Procedure: PACEMAKER IMPLANT;  Surgeon: [name], MD;  Location: [place] [facility];  Service: Cardiovascular;  Laterality: N/A;   TONSILLECTOMY     TOTAL ABDOMINAL HYSTERECTOMY W/ BILATERAL SALPINGOOPHORECTOMY     TOTAL HIP ARTHROPLASTY Left 07/18/2021   Procedure: TOTAL HIP ARTHROPLASTY ANTERIOR APPROACH;  Surgeon: Rod Can, MD;  Location: [facility];  Service: Orthopedics;  Laterality: Left;   TOTAL KNEE ARTHROPLASTY Right ~2008    Current Outpatient Medications  Medication Sig Dispense Refill   acetaminophen (TYLENOL) 500 MG tablet Take 500 mg by mouth every 6 (six) hours as needed for moderate pain or headache.     ARTIFICIAL TEAR SOLUTION OP Place 1 drop into both eyes 4 (four) times daily as needed (dry/irritated eyes.).     cephALEXin (KEFLEX) 500 MG capsule Take 4 capsules (2,000 mg total) by mouth once as needed for up to 1 dose (30-60 minutes prior to dental procedures). 4 capsule 0   cholecalciferol (VITAMIN D3) 25 MCG (1000 UNIT) tablet  Take 1,000 Units by mouth in the morning.     Chromium Picolinate 1000 MCG TABS Take 500 mcg by mouth daily after supper.     Coenzyme Q10 (CO Q 10) 100 MG CAPS Take 1 capsule by mouth daily.     CRANBERRY EXTRACT PO Take 2 tablets by mouth at bedtime.     Cyanocobalamin (VITAMIN B 12 PO) Take 1 tablet by mouth daily.     diltiazem (CARDIZEM) 30 MG tablet Take 1 tablet every 4 hours AS NEEDED for heart rate >100 as long as blood  pressure >100. (Patient taking differently: Take 30 mg by mouth every 4 (four) hours. Take 1 tablet every 4 hours AS NEEDED for heart rate >100 as long as blood pressure >100. Take every 4 hours until next daily dose.) 45 tablet 1   diltiazem (TIAZAC) 120 MG 24 hr capsule Take 120 mg by mouth daily.     dofetilide (TIKOSYN) 250 MCG capsule Take 1 capsule (250 mcg total) by mouth 2 (two) times daily. 60 capsule 6   estradiol (ESTRACE) 0.1 MG/GM vaginal cream Place 1 Applicatorful vaginally 3 (three) times a week.     fluticasone (FLONASE) 50 MCG/ACT nasal spray Place 1 spray into both nostrils daily.     FOLIC ACID PO Take 161 mcg by mouth in the morning.     furosemide (LASIX) 40 MG tablet Take 1 tablet (40 mg total) by mouth as needed.     magnesium oxide (MAG-OX) 400 (240 Mg) MG tablet Take 400 mg by mouth daily.     Olopatadine HCl (PATADAY OP) Place 1 drop into both eyes daily as needed.     Omega-3 Fatty Acids (FISH OIL PO) Take 1 tablet by mouth every evening.     pantoprazole (PROTONIX) 40 MG tablet Take 1 tablet (40 mg total) by mouth 2 (two) times daily.     pravastatin (PRAVACHOL) 40 MG tablet Take 1 tablet (40 mg total) by mouth at bedtime. 90 tablet 1   predniSONE (DELTASONE) 10 MG tablet Taking 1/2 tablet by mouth daily     Probiotic Product (PROBIOTIC DAILY) CAPS Take 1 tablet by mouth daily.     spironolactone (ALDACTONE) 25 MG tablet Take 1 tablet (25 mg total) by mouth daily. 30 tablet 6   sucralfate (CARAFATE) 1 g tablet Take 1 g by mouth 2 (two) times  daily.     valsartan (DIOVAN) 320 MG tablet Take 160 mg by mouth 2 (two) times daily.     XARELTO 20 MG TABS tablet TAKE 1 TABLET BY MOUTH EVERY DAY WITH SUPPER (Patient taking differently: Take 20 mg by mouth daily with supper.) 90 tablet 3   psyllium (REGULOID) 0.52 g capsule Take 2 capsules by mouth daily. (Patient not taking: Reported on 01/16/2022)     No current facility-administered medications for this encounter.    Allergies  Allergen Reactions   Ibuprofen Other (See Comments)    Pt is on Xarelto   Lactose Intolerance (Gi) Diarrhea and Nausea And Vomiting   Lisinopril Cough   Nsaids Other (See Comments)    Pt on Xarelto   Tape     Causes blisters    Tolmetin Other (See Comments)    Pt on Xarelto   Zoster Vaccine Live Other (See Comments)    Serum sickness, high fever, neck pain    Amoxicillin Rash   Penicillins Rash    Local reaction    Social History   Socioeconomic History   Marital status: Married    Spouse name: Not on file   Number of children: 1   Years of education: Not on file   Highest education level: Not on file  Occupational History   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  Tobacco Use   Smoking status: Former    Types: Cigarettes    Quit date: 01/06/1980    Years since quitting: 42.0   Smokeless tobacco: Never   Tobacco comments:    smoked from [place] to 1982, less than 1 ppd  Vaping Use   Vaping Use: Never used  Substance and Sexual Activity   Alcohol use: Yes    Comment: rare   Drug use: No   Sexual activity: Not Currently    Birth control/protection: None  Other Topics Concern   Not on file  Social History Narrative   Lives w/ husband, and son [name]   Retired Marine scientist   Lives in [place] Strain: [place]  (06/08/2020)   Overall Financial Resource Strain ([place])    Difficulty of Paying Living Expenses: Not hard at all  Food Insecurity: No [place] (01/09/2022)   Hunger Vital Sign     Worried About Running Out of Food in the Last Year: Never true    [place] in the Last Year: Never true  Transportation Needs: No Transportation Needs (01/09/2022)   PRAPARE - Hydrologist (Medical): No    Lack of Transportation (Non-Medical): No  Physical Activity: Sufficiently Active (06/08/2020)   Exercise Vital Sign    Days of Exercise per Week: 7 days    Minutes of Exercise per Session: 30 min  Stress: No Stress Concern Present (06/08/2020)   [place]     Feeling of Stress : Not at all  Social Connections: Moderately Integrated (06/08/2020)   Social Connection and Isolation Panel [NHANES]    Frequency of Communication with Friends and Family: More than three times a week    Frequency of Social Gatherings with Friends and Family: Once a week    Attends Religious Services: Never    Marine scientist or Organizations: Yes    Attends Music therapist: More than 4 times per year    Marital Status: Married  Human resources officer Violence: Not At Risk (01/03/2022)   Humiliation, Afraid, Rape, and Kick questionnaire    Fear of Current or Ex-Partner: No    Emotionally Abused: No    Physically Abused: No    Sexually Abused: No    Family History  Problem Relation Age of Onset   Hypertension Mother    Colon cancer Mother    Breast cancer Mother    Ovarian cancer Mother    Hypertension Father    Diabetes Father    Head & neck cancer Sister    CAD Neg Hx     ROS- All systems are reviewed and negative except as per the HPI above  Physical Exam: Vitals:   01/16/22 1508  BP: (!) 132/54  Pulse: 60  Weight: 110.9 kg  Height: '5\' 6"'$  (1.676 m)   Wt Readings from Last 3 Encounters:  01/16/22 110.9 kg  01/03/22 110.9 kg  01/03/22 111.4 kg    Labs: Lab Results  Component Value Date   NA 137 01/06/2022   K 4.4 01/06/2022   CL 108 01/06/2022   CO2 25 01/06/2022   GLUCOSE  113 (H) 01/06/2022   BUN 17 01/06/2022   CREATININE 0.83 01/06/2022   CALCIUM 9.6 01/06/2022   MG 2.2 01/06/2022   Lab Results  Component Value Date   INR 1.2 07/14/2021   Lab Results  Component Value Date   CHOL 173 03/23/2021   HDL 69.40 03/23/2021   LDLCALC 89 03/23/2021   TRIG 75.0 03/23/2021     GEN- The patient is well appearing, alert and oriented x 3 today.   Head- normocephalic, atraumatic Eyes-  Sclera clear, conjunctiva pink Ears- hearing intact Oropharynx- clear Neck- supple, no JVP  Lymph- no cervical lymphadenopathy Lungs- Clear to ausculation bilaterally, normal work of breathing Heart- regular rate and irregular  Rhythm(pvc's), no murmurs, rubs or gallops, PMI not laterally displaced GI- soft, NT, ND, + BS Extremities- no clubbing, cyanosis, or edema MS- no significant deformity or atrophy Skin- no rash or lesion Psych- euthymic mood, full affect Neuro- strength and sensation are intact  EKG-  Vent. rate 60 BPM PR interval 254 ms QRS duration 160 ms QT/QTcB 462/462 ms P-R-T axes 88 -9 77 Atrial-paced rhythm with prolonged AV conduction Right bundle branch block Lateral infarct , age undetermined Abnormal ECG When compared with ECG of 06-Jan-2022 11:07, PREVIOUS ECG IS PRESENT  02/11/21- Echo-1. Left ventricular ejection fraction, by estimation, is 65 to 70%. The  left ventricle has normal function. The left ventricle has no regional  wall motion abnormalities. There is moderate asymmetric left ventricular  hypertrophy of the basal-septal  segment. Left ventricular diastolic parameters are indeterminate.   2. Right ventricular systolic function is normal. The right ventricular  size is mildly enlarged. There is normal pulmonary artery systolic  pressure. The estimated right ventricular systolic pressure is 15.4 mmHg.   3. The mitral valve is normal in structure. Trivial mitral valve  regurgitation. No evidence of mitral stenosis.   4. The aortic  valve is tricuspid. Aortic valve regurgitation is not  visualized. No aortic stenosis is present.   5. The inferior vena cava is normal in size with greater than 50%  respiratory variability, suggesting right atrial pressure of 3 mmHg.    Assessment and Plan: 1. Paroxysmal afib  Pt is here one week s/p Tikosyn admission Discharged  on 250 mcg dofetilide  bid  Rx called into [facility] She did not require DCCV as she was in SR on admission  Bmet/mag today   2. CHA2DS2VASc score of 3 Continue xarelto 20 mg daily States no missed doses   3. HTN Stable Has been taking valsartan 160 mg  qd since d/c Hctz was stopped prior to tikosyn load  Now on spironolactone    Lorree Millar C. Ocean Kearley, [facility] [address] [place], [place] [address] [phone]

## 2022-01-17 ENCOUNTER — Other Ambulatory Visit (HOSPITAL_COMMUNITY): Payer: Self-pay | Admitting: *Deleted

## 2022-01-17 DIAGNOSIS — M6281 Muscle weakness (generalized): Secondary | ICD-10-CM | POA: Diagnosis not present

## 2022-01-17 DIAGNOSIS — M546 Pain in thoracic spine: Secondary | ICD-10-CM | POA: Diagnosis not present

## 2022-01-17 DIAGNOSIS — Z9181 History of falling: Secondary | ICD-10-CM | POA: Diagnosis not present

## 2022-01-17 DIAGNOSIS — M2569 Stiffness of other specified joint, not elsewhere classified: Secondary | ICD-10-CM | POA: Diagnosis not present

## 2022-01-17 MED ORDER — POTASSIUM CHLORIDE CRYS ER 20 MEQ PO TBCR: 20.0000 meq | EXTENDED_RELEASE_TABLET | Freq: Every day | ORAL | 3 refills | Status: DC

## 2022-01-19 ENCOUNTER — Other Ambulatory Visit (HOSPITAL_BASED_OUTPATIENT_CLINIC_OR_DEPARTMENT_OTHER): Payer: Self-pay | Admitting: Internal Medicine

## 2022-01-19 DIAGNOSIS — Z1231 Encounter for screening mammogram for malignant neoplasm of breast: Secondary | ICD-10-CM

## 2022-01-19 DIAGNOSIS — S29012A Strain of muscle and tendon of back wall of thorax, initial encounter: Secondary | ICD-10-CM | POA: Diagnosis not present

## 2022-01-20 DIAGNOSIS — M546 Pain in thoracic spine: Secondary | ICD-10-CM | POA: Diagnosis not present

## 2022-01-20 DIAGNOSIS — M2569 Stiffness of other specified joint, not elsewhere classified: Secondary | ICD-10-CM | POA: Diagnosis not present

## 2022-01-20 DIAGNOSIS — M6281 Muscle weakness (generalized): Secondary | ICD-10-CM | POA: Diagnosis not present

## 2022-01-20 DIAGNOSIS — Z9181 History of falling: Secondary | ICD-10-CM | POA: Diagnosis not present

## 2022-01-21 ENCOUNTER — Other Ambulatory Visit: Payer: Self-pay | Admitting: Internal Medicine

## 2022-01-23 ENCOUNTER — Other Ambulatory Visit (HOSPITAL_COMMUNITY): Payer: Self-pay | Admitting: *Deleted

## 2022-01-23 ENCOUNTER — Encounter (HOSPITAL_COMMUNITY): Payer: Self-pay

## 2022-01-23 ENCOUNTER — Other Ambulatory Visit (HOSPITAL_COMMUNITY): Payer: Medicare Other | Admitting: Physician Assistant

## 2022-01-23 DIAGNOSIS — I480 Paroxysmal atrial fibrillation: Secondary | ICD-10-CM

## 2022-01-23 DIAGNOSIS — I872 Venous insufficiency (chronic) (peripheral): Secondary | ICD-10-CM | POA: Diagnosis not present

## 2022-01-23 MED ORDER — RIVAROXABAN 20 MG PO TABS: 20.0000 mg | ORAL_TABLET | Freq: Every day | ORAL | 1 refills | Status: DC

## 2022-01-24 ENCOUNTER — Ambulatory Visit: Payer: Medicare Other | Attending: Nurse Practitioner

## 2022-01-24 DIAGNOSIS — I480 Paroxysmal atrial fibrillation: Secondary | ICD-10-CM | POA: Insufficient documentation

## 2022-01-24 LAB — BASIC METABOLIC PANEL
Anion gap: 6 (ref 5–15)
BUN: 20 mg/dL (ref 8–23)
CO2: 26 mmol/L (ref 22–32)
Calcium: 9.8 mg/dL (ref 8.9–10.3)
Chloride: 105 mmol/L (ref 98–111)
Creatinine, Ser: 0.67 mg/dL (ref 0.44–1.00)
GFR, Estimated: >60 mL/min (ref >60)
Glucose, Bld: 72 mg/dL (ref 70–99)
Potassium: 4.1 mmol/L (ref 3.5–5.1)
Sodium: 137 mmol/L (ref 135–145)

## 2022-01-25 ENCOUNTER — Encounter (HOSPITAL_COMMUNITY): Payer: Self-pay | Admitting: *Deleted

## 2022-01-25 ENCOUNTER — Ambulatory Visit (HOSPITAL_BASED_OUTPATIENT_CLINIC_OR_DEPARTMENT_OTHER)
Admission: RE | Admit: 2022-01-25 | Discharge: 2022-01-25 | Disposition: A | Discharge status: Home / Self Care | Payer: Medicare Other | Source: Ambulatory Visit | Attending: Internal Medicine | Admitting: Internal Medicine

## 2022-01-25 ENCOUNTER — Encounter (HOSPITAL_BASED_OUTPATIENT_CLINIC_OR_DEPARTMENT_OTHER): Payer: Self-pay

## 2022-01-25 ENCOUNTER — Other Ambulatory Visit (HOSPITAL_COMMUNITY): Payer: Self-pay

## 2022-01-25 DIAGNOSIS — Z1231 Encounter for screening mammogram for malignant neoplasm of breast: Secondary | ICD-10-CM | POA: Diagnosis not present

## 2022-01-26 ENCOUNTER — Other Ambulatory Visit (HOSPITAL_COMMUNITY): Payer: Self-pay

## 2022-01-26 DIAGNOSIS — I83892 Varicose veins of left lower extremities with other complications: Secondary | ICD-10-CM | POA: Diagnosis not present

## 2022-01-26 DIAGNOSIS — Z09 Encounter for follow-up examination after completed treatment for conditions other than malignant neoplasm: Secondary | ICD-10-CM | POA: Diagnosis not present

## 2022-01-27 ENCOUNTER — Telehealth: Payer: Self-pay | Admitting: Internal Medicine

## 2022-01-27 DIAGNOSIS — Z9181 History of falling: Secondary | ICD-10-CM | POA: Diagnosis not present

## 2022-01-27 DIAGNOSIS — M2569 Stiffness of other specified joint, not elsewhere classified: Secondary | ICD-10-CM | POA: Diagnosis not present

## 2022-01-27 DIAGNOSIS — M6281 Muscle weakness (generalized): Secondary | ICD-10-CM | POA: Diagnosis not present

## 2022-01-27 DIAGNOSIS — M546 Pain in thoracic spine: Secondary | ICD-10-CM | POA: Diagnosis not present

## 2022-01-27 NOTE — Telephone Encounter (Signed)
Patient was concerned that her Deloris Ping shows she had her mammogram but no results. Advised patient that the radiologist has not reviewed it yet so report is not available. Patient understands that results are still pending.

## 2022-01-30 ENCOUNTER — Other Ambulatory Visit: Payer: Self-pay | Admitting: Internal Medicine

## 2022-01-30 ENCOUNTER — Telehealth: Payer: Self-pay | Admitting: *Deleted

## 2022-01-30 MED ORDER — SPIRONOLACTONE 25 MG PO TABS: 25.0000 mg | ORAL_TABLET | Freq: Every day | ORAL | 0 refills | Status: DC

## 2022-01-30 NOTE — Telephone Encounter (Signed)
-----  Message from Enid Derry, [place] sent at 01/30/2022  3:27 PM EST ----- Patient needs a refill on her Spironolactone medication. She only has enough to last her until the end of this week. She needs a 90 day supply to go to [facility] in [place].  She contacted our clinic regarding her medication. Thanks so much,  [facility]

## 2022-01-30 NOTE — Telephone Encounter (Signed)
Pt scheduled to see Dr. Caryl Comes 02/13/22, 30 day refill of Spironolactone has been sent to [facility].

## 2022-01-31 DIAGNOSIS — M6281 Muscle weakness (generalized): Secondary | ICD-10-CM | POA: Diagnosis not present

## 2022-01-31 DIAGNOSIS — Z9181 History of falling: Secondary | ICD-10-CM | POA: Diagnosis not present

## 2022-01-31 DIAGNOSIS — M546 Pain in thoracic spine: Secondary | ICD-10-CM | POA: Diagnosis not present

## 2022-01-31 DIAGNOSIS — M2569 Stiffness of other specified joint, not elsewhere classified: Secondary | ICD-10-CM | POA: Diagnosis not present

## 2022-02-03 DIAGNOSIS — M6281 Muscle weakness (generalized): Secondary | ICD-10-CM | POA: Diagnosis not present

## 2022-02-03 DIAGNOSIS — Z9181 History of falling: Secondary | ICD-10-CM | POA: Diagnosis not present

## 2022-02-03 DIAGNOSIS — R197 Diarrhea, unspecified: Secondary | ICD-10-CM | POA: Diagnosis not present

## 2022-02-03 DIAGNOSIS — M546 Pain in thoracic spine: Secondary | ICD-10-CM | POA: Diagnosis not present

## 2022-02-03 DIAGNOSIS — M2569 Stiffness of other specified joint, not elsewhere classified: Secondary | ICD-10-CM | POA: Diagnosis not present

## 2022-02-06 DIAGNOSIS — I872 Venous insufficiency (chronic) (peripheral): Secondary | ICD-10-CM | POA: Diagnosis not present

## 2022-02-08 DIAGNOSIS — M6281 Muscle weakness (generalized): Secondary | ICD-10-CM | POA: Diagnosis not present

## 2022-02-08 DIAGNOSIS — M546 Pain in thoracic spine: Secondary | ICD-10-CM | POA: Diagnosis not present

## 2022-02-08 DIAGNOSIS — M2569 Stiffness of other specified joint, not elsewhere classified: Secondary | ICD-10-CM | POA: Diagnosis not present

## 2022-02-08 DIAGNOSIS — Z9181 History of falling: Secondary | ICD-10-CM | POA: Diagnosis not present

## 2022-02-09 ENCOUNTER — Encounter (HOSPITAL_COMMUNITY): Payer: Self-pay | Admitting: *Deleted

## 2022-02-10 DIAGNOSIS — I83892 Varicose veins of left lower extremities with other complications: Secondary | ICD-10-CM | POA: Diagnosis not present

## 2022-02-10 DIAGNOSIS — I83812 Varicose veins of left lower extremities with pain: Secondary | ICD-10-CM | POA: Diagnosis not present

## 2022-02-10 DIAGNOSIS — Z09 Encounter for follow-up examination after completed treatment for conditions other than malignant neoplasm: Secondary | ICD-10-CM | POA: Diagnosis not present

## 2022-02-13 ENCOUNTER — Ambulatory Visit: Payer: Medicare Other | Attending: Internal Medicine | Admitting: Internal Medicine

## 2022-02-13 ENCOUNTER — Encounter: Payer: Self-pay | Admitting: Internal Medicine

## 2022-02-13 VITALS — BP 134/68 | HR 63 | Ht 66.0 in | Wt 245.0 lb

## 2022-02-13 DIAGNOSIS — I493 Ventricular premature depolarization: Secondary | ICD-10-CM | POA: Diagnosis not present

## 2022-02-13 DIAGNOSIS — Z79899 Other long term (current) drug therapy: Secondary | ICD-10-CM | POA: Diagnosis not present

## 2022-02-13 DIAGNOSIS — R001 Bradycardia, unspecified: Secondary | ICD-10-CM

## 2022-02-13 DIAGNOSIS — I4891 Unspecified atrial fibrillation: Secondary | ICD-10-CM

## 2022-02-13 DIAGNOSIS — Z950 Presence of cardiac pacemaker: Secondary | ICD-10-CM

## 2022-02-13 DIAGNOSIS — I4510 Unspecified right bundle-branch block: Secondary | ICD-10-CM

## 2022-02-13 DIAGNOSIS — I4892 Unspecified atrial flutter: Secondary | ICD-10-CM

## 2022-02-13 LAB — CUP PACEART INCLINIC DEVICE CHECK
Battery Remaining Longevity: 105 mo
Battery Voltage: 3.02 V
Brady Statistic RA Percent Paced: 90 %
Brady Statistic RV Percent Paced: 13 %
Date Time Interrogation Session: 20240212191324
Implantable Lead Connection Status: 753985
Implantable Lead Connection Status: 753985
Implantable Lead Implant Date: 20220912
Implantable Lead Implant Date: 20220912
Implantable Lead Location: 753859
Implantable Lead Location: 753860
Implantable Lead Model: 3830
Implantable Pulse Generator Implant Date: 20220912
Lead Channel Impedance Value: 412.5 Ohm
Lead Channel Impedance Value: 600 Ohm
Lead Channel Pacing Threshold Amplitude: 0.75 V
Lead Channel Pacing Threshold Amplitude: 0.75 V
Lead Channel Pacing Threshold Amplitude: 1 V
Lead Channel Pacing Threshold Amplitude: 1 V
Lead Channel Pacing Threshold Pulse Width: 0.4 ms
Lead Channel Pacing Threshold Pulse Width: 0.4 ms
Lead Channel Pacing Threshold Pulse Width: 0.4 ms
Lead Channel Pacing Threshold Pulse Width: 0.4 ms
Lead Channel Sensing Intrinsic Amplitude: 1.2 mV
Lead Channel Sensing Intrinsic Amplitude: 12 mV
Lead Channel Setting Pacing Amplitude: 2 V
Lead Channel Setting Pacing Amplitude: 2.5 V
Lead Channel Setting Pacing Pulse Width: 0.4 ms
Lead Channel Setting Sensing Sensitivity: 2 mV
Pulse Gen Model: 2272
Pulse Gen Serial Number: 3956585

## 2022-02-13 MED ORDER — FUROSEMIDE 40 MG PO TABS: 40.0000 mg | ORAL_TABLET | ORAL | 0 refills | Status: DC | PRN

## 2022-02-13 NOTE — Patient Instructions (Addendum)
Medication Instructions:  Your physician has recommended you make the following change in your medication:   ** Stop Diltiazem 166m   *If you need a refill on your cardiac medications before your next appointment, please call your pharmacy*   Lab Work: CBC and BMET in 2 weeks  If you have labs (blood work) drawn today and your tests are completely normal, you will receive your results only by: MTryon(if you have MyChart) OR A paper copy in the mail If you have any lab test that is abnormal or we need to change your treatment, we will call you to review the results.   Testing/Procedures: None ordered.    Follow-Up: [facility] you and your health needs are our priority.  As part of our continuing mission to provide you with exceptional heart care, we have created designated Provider Care Teams.  These Care Teams include your primary Cardiologist (physician) and Advanced Practice Providers (APPs -  Physician Assistants and Nurse Practitioners) who all work together to provide you with the care you need, when you need it.  We recommend signing up for the patient portal called "MyChart".  Sign up information is provided on this After Visit Summary.  MyChart is used to connect with patients for Virtual Visits (Telemedicine).  Patients are able to view lab/test results, encounter notes, upcoming appointments, etc.  Non-urgent messages can be sent to your provider as well.   To learn more about what you can do with MyChart, go to [email or web address]    Your next appointment:   6 months with Dr KCaryl Comes

## 2022-02-13 NOTE — Progress Notes (Signed)
Patient ID: Veronica Bradford, female   DOB: 1945/10/02, 77 y.o.   MRN: [id number]      Patient Care Team: Colon Branch, MD as PCP - General (Internal Medicine) [name], MD as PCP - Cardiology (Cardiology) [name], MD as Consulting Physician (Cardiology) [name], MD as Consulting Physician (Gastroenterology) [place], Heather, [place] as Consulting Physician (Optometry) Sherlynn Stalls, MD as Consulting Physician (Ophthalmology) Roseanne Kaufman, MD as Consulting Physician (Orthopedic Surgery) Armandina Gemma, MD as Consulting Physician (General Surgery)   HPI  Veronica Bradford is a 77 y.o. 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  She does not need to get cardioverted however.  Atrial fibrillation seems to be associated in some way with her IBS which is associated with diarrhea.  She has treated sleep apnea with good reports.   Anticoagulation with Rivaroxaban; without bleeding   More recently has had episodes of atrial fibrillation about once every month or 2 lasting 4 -9 hours,  At last visit with persistent atrial fibrillation she was set up for cardioversion; she reverted spontaneously.    Admitted 1/24 for initiation of Tikosyn has had intercurrent atrial fibrillation of which she is unaware.  Hospitalization was notable for low potassium, spironolactone was added.  It remains low and potassium supplementation was added with a repeat check that was normal.  DOE but no chest pain.  Some edema, and she takes her furosemide as needed 3 times per week         Date Cr Hgb  K Mag  5/18 0.66 14.7 3.7 2.1  12/21 0.59 13.3 3.9 2.2 6  4/22 0.58 13.7 3.6   9/22 0.71     11/22 3.5 10.2    7/23 0.62 11.6 3.9   1/24 0.67 15.2 4.1      DATE  TEST EF   9/15 Myoview  65 % No ischemia  8/17 Echo   60-65 %   8/21 Myoview 71% No ischemia  8/21 Echo  60-65% LAE-mod-severe  (42.5 ml/m2)  2/23 Echo  65-70%           Past Medical History:  Diagnosis Date   Atrial fibrillation (HCC)    Atrial fibrillation (HCC)    Back pain    Chronic sinusitis    s/p 2 surgeries remotely, [name]   Closed fracture of metatarsal of left foot    L foot, fifth metatarsal   Ectopic pregnancy    Endometrial cancer ([place]) 2004   Early diagnosis   GERD (gastroesophageal reflux disease)    Hyperlipemia    Hypertension    IBS (irritable bowel syndrome) 08/13/2014   Dx 2015, diarrhea on-off, Dr [name]    Lumbar radiculopathy 08/2012   MRI in 08/2012   Mild hyperparathyroidism ([place])    Osteopenia    PAF (paroxysmal atrial fibrillation) ([place])    Prediabetes    Sciatica of right side 08/2013   Received Depomedrol 80 mg injection   Sleep apnea 09/09/2013   Dx with OSA in 2015, by Dr. Caryl Comes, APAP    Past Surgical History:  Procedure Laterality Date   ATRIAL FIBRILLATION ABLATION N/A 09/04/2017   Procedure: ATRIAL FIBRILLATION ABLATION;  Surgeon: [name], MD;  Location: [facility];  Service: Cardiovascular;  Laterality: N/A;  BIOPSY  03/11/2020   Procedure: BIOPSY;  Surgeon: [name], MD;  [facility]: [facility];  Service: Endoscopy;;  EGD and COLON   CARPAL TUNNEL RELEASE Right 06/17/2019   CARPAL TUNNEL RELEASE Left 11/2020   CHOLECYSTECTOMY  ~2006   COLONOSCOPY WITH PROPOFOL N/A 03/11/2020   Procedure: COLONOSCOPY WITH PROPOFOL;  Surgeon: [name], MD;  [facility];  Service: Endoscopy;  Laterality: N/A;   DILATION AND CURETTAGE OF UTERUS     after SAB   ESOPHAGOGASTRODUODENOSCOPY  02/09/2014   Dr. [name]   ESOPHAGOGASTRODUODENOSCOPY (EGD) WITH PROPOFOL N/A 03/11/2020   Procedure: ESOPHAGOGASTRODUODENOSCOPY (EGD) WITH PROPOFOL;  Surgeon: [name], MD;  [facility];  Service:  Endoscopy;  Laterality: N/A;   FINGER SURGERY Left    index   NASAL SINUS SURGERY     x 2 remotely, [name]   PACEMAKER IMPLANT N/A 09/13/2020   Procedure: PACEMAKER IMPLANT;  Surgeon: [name], MD;  Location: [facility];  Service: Cardiovascular;  Laterality: N/A;   TONSILLECTOMY     TOTAL ABDOMINAL HYSTERECTOMY W/ BILATERAL SALPINGOOPHORECTOMY     TOTAL HIP ARTHROPLASTY Left 07/18/2021   Procedure: TOTAL HIP ARTHROPLASTY ANTERIOR APPROACH;  Surgeon: Rod Can, MD;  Location: [facility];  Service: Orthopedics;  Laterality: Left;   TOTAL KNEE ARTHROPLASTY Right ~2008    Current Outpatient Medications  Medication Sig Dispense Refill   acetaminophen (TYLENOL) 500 MG tablet Take 500 mg by mouth every 6 (six) hours as needed for moderate pain or headache.     ARTIFICIAL TEAR SOLUTION OP Place 1 drop into both eyes 4 (four) times daily as needed (dry/irritated eyes.).     cephALEXin (KEFLEX) 500 MG capsule Take 4 capsules (2,000 mg total) by mouth once as needed for up to 1 dose (30-60 minutes prior to dental procedures). 4 capsule 0   cholecalciferol (VITAMIN D3) 25 MCG (1000 UNIT) tablet Take 1,000 Units by mouth in the morning.     Chromium Picolinate 1000 MCG TABS Take 500 mcg by mouth daily after supper.     Coenzyme Q10 (CO Q 10) 100 MG CAPS Take 1 capsule by mouth daily.     colestipol (COLESTID) 1 g tablet Take 1 g by mouth 2 (two) times daily as needed.     CRANBERRY EXTRACT PO Take 2 tablets by mouth at bedtime.     Cyanocobalamin (VITAMIN B 12 PO) Take 1 tablet by mouth daily.     diltiazem (CARDIZEM) 30 MG tablet Take 1 tablet every 4 hours AS NEEDED for heart rate >100 as long as blood pressure >100. (Patient taking differently: Take 30 mg by mouth every 4 (four) hours. Take 1 tablet every 4 hours AS NEEDED for heart rate >100 as long as blood pressure >100. Take every 4 hours until next daily dose.) 45 tablet 1   diltiazem (TIAZAC) 120 MG 24 hr capsule Take 120  mg by mouth daily.     dofetilide (TIKOSYN) 250 MCG capsule Take 1 capsule (250 mcg total) by mouth 2 (two) times daily. 180 capsule 1   estradiol (ESTRACE) 0.1 MG/GM vaginal cream Place 1 Applicatorful vaginally 3 (three) times a week.     fluticasone (FLONASE) 50 MCG/ACT nasal spray Place 1 spray into both nostrils daily.     FOLIC ACID PO Take [id number] mcg by mouth in the morning.     furosemide (LASIX) 40 MG tablet Take 1 tablet (40 mg total) by mouth as needed.     magnesium  oxide (MAG-OX) 400 (240 Mg) MG tablet Take 400 mg by mouth daily.     Olopatadine HCl (PATADAY OP) Place 1 drop into both eyes daily as needed.     Omega-3 Fatty Acids (FISH OIL PO) Take 1 tablet by mouth every evening.     pantoprazole (PROTONIX) 40 MG tablet Take 1 tablet (40 mg total) by mouth 2 (two) times daily.     potassium chloride SA (KLOR-CON M) 20 MEQ tablet Take 1 tablet (20 mEq total) by mouth daily. 30 tablet 3   pravastatin (PRAVACHOL) 40 MG tablet Take 1 tablet (40 mg total) by mouth at bedtime. 90 tablet 1   Probiotic Product (PROBIOTIC DAILY) CAPS Take 1 tablet by mouth daily.     psyllium (REGULOID) 0.52 g capsule Take 2 capsules by mouth daily.     rivaroxaban (XARELTO) 20 MG TABS tablet Take 1 tablet (20 mg total) by mouth daily with supper. 180 tablet 1   spironolactone (ALDACTONE) 25 MG tablet TAKE 1 TABLET(25 MG) BY MOUTH DAILY 90 tablet 0   sucralfate (CARAFATE) 1 g tablet Take 1 g by mouth 2 (two) times daily.     valsartan (DIOVAN) 320 MG tablet Take 160 mg by mouth daily.     No current facility-administered medications for this visit.    Allergies  Allergen Reactions   Ibuprofen Other (See Comments)    Pt is on Xarelto   Lactose Intolerance (Gi) Diarrhea and Nausea And Vomiting   Lisinopril Cough   Nsaids Other (See Comments)    Pt on Xarelto   Tape     Causes blisters    Tolmetin Other (See Comments)    Pt on Xarelto   Zoster Vaccine Live Other (See Comments)    Serum sickness,  high fever, neck pain    Amoxicillin Rash   Penicillins Rash    Local reaction    Review of Systems negative except from HPI and PMH (+) Shortness of Breath (+) Lightheadedness (+) LE Edema  Physical Exam: BP 134/68   Pulse 63   Ht 5' 6"$  (1.676 m)   Wt 245 lb (111.1 kg)   SpO2 96%   BMI 39.54 kg/m   Well developed and nourished in no acute distress HENT normal Neck supple with JVP-  flat  Clear Regular rate and rhythm, no murmurs or gallops Abd-soft with active BS No Clubbing cyanosis edema Skin-warm and dry A & Oriented  Grossly normal sensory and motor function  ECG sinus at 63 Intervals 20/15/47 Right bundle branch block Q waves V1 and V2 question septal MI (new since 01/16/2022 question lead placement)   Assessment and  Plan  Atrial fibrillation-flutter persistent  CHADS-VASc score 2 (age/gender)    Obesity  RBBB/left axis deviation  PVCs  Sinus bradycardia/junctional rhythm  Interval atrial fibrillation of which she was unaware.  False detections from her device so her atrial sensitivity was decreased from 0.5--0.75 mV.  Her diltiazem was previously used in conjunction with the flecainide, we will discontinue it.  Her blood pressures have been increasingly well-controlled.  She will continue to monitor it as we remove the diltiazem; she will use the diltiazem as needed if she has recurrent atrial fibrillation  For blood pressure which is reasonably controlled, we will just continue the diltiazem as noted, she is currently taking Diovan at 160 mg about per day plus the Aldactone.  Continue on the magnesium.  She is aware that Tikosyn does not affect ventricular arrhythmias  No bleeding  problem continue Xarelto

## 2022-02-15 ENCOUNTER — Telehealth (HOSPITAL_COMMUNITY): Payer: Self-pay | Admitting: *Deleted

## 2022-02-15 DIAGNOSIS — M6281 Muscle weakness (generalized): Secondary | ICD-10-CM | POA: Diagnosis not present

## 2022-02-15 DIAGNOSIS — M2569 Stiffness of other specified joint, not elsewhere classified: Secondary | ICD-10-CM | POA: Diagnosis not present

## 2022-02-15 DIAGNOSIS — M546 Pain in thoracic spine: Secondary | ICD-10-CM | POA: Diagnosis not present

## 2022-02-15 DIAGNOSIS — Z9181 History of falling: Secondary | ICD-10-CM | POA: Diagnosis not present

## 2022-02-15 NOTE — Telephone Encounter (Signed)
Pt left a voicemail stating she is back in A-fib. She is sending in a transmission and would like the results to be sent to the A-fib clinic.  I spoke with the patient to let her know that we got her voicemail. I did not see the transmission. She called tech support and we should get the transmission in 10 minutes.

## 2022-02-15 NOTE — Telephone Encounter (Signed)
Transmission received.  Presenting rhythm below.

## 2022-02-15 NOTE — Telephone Encounter (Signed)
Transmission received 02/15/2022 at 3:22 pm

## 2022-02-15 NOTE — Telephone Encounter (Signed)
Pt called in stating she feels she has been back in AF since early this morning. Her HRs are elevated and SOB with exertion. Pt was recently taken off cardizem 156m daily but pt did take one this morning since she was back in AF. She took a 333mof cardizem at 2pm. HRs still feel elevated. Pt will attempt to send transmission to eval her AF burden since starting tikosyn. Pt will call in AM if her HRs are still elevated.

## 2022-02-16 NOTE — Telephone Encounter (Signed)
Pt converted into NSR about 7pm last night. Feeling back at baseline today. Pt will watch AF burden. Call if increase in frequency.

## 2022-02-24 DIAGNOSIS — I83892 Varicose veins of left lower extremities with other complications: Secondary | ICD-10-CM | POA: Diagnosis not present

## 2022-02-24 DIAGNOSIS — I87392 Chronic venous hypertension (idiopathic) with other complications of left lower extremity: Secondary | ICD-10-CM | POA: Diagnosis not present

## 2022-02-27 ENCOUNTER — Ambulatory Visit: Payer: Medicare Other | Attending: Internal Medicine

## 2022-02-27 DIAGNOSIS — Z79899 Other long term (current) drug therapy: Secondary | ICD-10-CM

## 2022-02-27 DIAGNOSIS — I493 Ventricular premature depolarization: Secondary | ICD-10-CM | POA: Diagnosis not present

## 2022-02-27 DIAGNOSIS — I4891 Unspecified atrial fibrillation: Secondary | ICD-10-CM | POA: Diagnosis not present

## 2022-02-27 DIAGNOSIS — I4892 Unspecified atrial flutter: Secondary | ICD-10-CM | POA: Diagnosis not present

## 2022-02-27 DIAGNOSIS — R001 Bradycardia, unspecified: Secondary | ICD-10-CM | POA: Diagnosis not present

## 2022-02-27 DIAGNOSIS — Z950 Presence of cardiac pacemaker: Secondary | ICD-10-CM

## 2022-02-27 DIAGNOSIS — I4510 Unspecified right bundle-branch block: Secondary | ICD-10-CM

## 2022-02-28 LAB — CBC
Hematocrit: 35.2 % (ref 34.0–46.6)
Hemoglobin: 10.7 g/dL — ABNORMAL LOW (ref 11.1–15.9)
MCH: 24.8 pg — ABNORMAL LOW (ref 26.6–33.0)
MCHC: 30.4 g/dL — ABNORMAL LOW (ref 31.5–35.7)
MCV: 82 fL (ref 79–97)
Platelets: 281 10*3/uL (ref 150–450)
RBC: 4.31 x10E6/uL (ref 3.77–5.28)
RDW: 15.3 % (ref 11.7–15.4)
WBC: 6.7 10*3/uL (ref 3.4–10.8)

## 2022-02-28 LAB — BASIC METABOLIC PANEL
BUN/Creatinine Ratio: 29 — ABNORMAL HIGH (ref 12–28)
BUN: 20 mg/dL (ref 8–27)
CO2: 23 mmol/L (ref 20–29)
Calcium: 10.9 mg/dL — ABNORMAL HIGH (ref 8.7–10.3)
Chloride: 104 mmol/L (ref 96–106)
Creatinine, Ser: 0.69 mg/dL (ref 0.57–1.00)
Glucose: 97 mg/dL (ref 70–99)
Potassium: 4 mmol/L (ref 3.5–5.2)
Sodium: 140 mmol/L (ref 134–144)
eGFR: 91 mL/min/{1.73_m2} (ref >59)

## 2022-03-02 DIAGNOSIS — G4733 Obstructive sleep apnea (adult) (pediatric): Secondary | ICD-10-CM | POA: Diagnosis not present

## 2022-03-06 ENCOUNTER — Other Ambulatory Visit (HOSPITAL_COMMUNITY): Payer: Self-pay | Admitting: Family Medicine

## 2022-03-06 ENCOUNTER — Other Ambulatory Visit (HOSPITAL_BASED_OUTPATIENT_CLINIC_OR_DEPARTMENT_OTHER): Payer: Self-pay | Admitting: Family Medicine

## 2022-03-06 DIAGNOSIS — M546 Pain in thoracic spine: Secondary | ICD-10-CM

## 2022-03-13 DIAGNOSIS — R197 Diarrhea, unspecified: Secondary | ICD-10-CM | POA: Diagnosis not present

## 2022-03-14 ENCOUNTER — Other Ambulatory Visit: Payer: Self-pay | Admitting: Internal Medicine

## 2022-03-14 ENCOUNTER — Encounter: Payer: Self-pay | Admitting: General Practice

## 2022-03-14 ENCOUNTER — Ambulatory Visit (INDEPENDENT_AMBULATORY_CARE_PROVIDER_SITE_OTHER): Payer: Medicare Other

## 2022-03-14 DIAGNOSIS — R001 Bradycardia, unspecified: Secondary | ICD-10-CM | POA: Diagnosis not present

## 2022-03-14 NOTE — Telephone Encounter (Signed)
Tylenol #3 requested, no longer on med list. Please advise?

## 2022-03-14 NOTE — Telephone Encounter (Signed)
PDMP reviewed, got Tylenol #3, 30 tablets September 2023.  She has been taking it on and off for a while. Advise patient: I will send a refill.  If she is having increasing or severe pain let me know.

## 2022-03-15 ENCOUNTER — Encounter: Payer: Self-pay | Admitting: Internal Medicine

## 2022-03-16 LAB — CUP PACEART REMOTE DEVICE CHECK
Battery Remaining Longevity: 107 mo
Battery Remaining Percentage: 87 %
Battery Voltage: 3.02 V
Brady Statistic AP VP Percent: 1 %
Brady Statistic AP VS Percent: 47 %
Brady Statistic AS VP Percent: 1 %
Brady Statistic AS VS Percent: 52 %
Brady Statistic RA Percent Paced: 46 %
Brady Statistic RV Percent Paced: 1 %
Date Time Interrogation Session: 20240312020020
Implantable Lead Connection Status: 753985
Implantable Lead Connection Status: 753985
Implantable Lead Implant Date: 20220912
Implantable Lead Implant Date: 20220912
Implantable Lead Location: 753859
Implantable Lead Location: 753860
Implantable Lead Model: 3830
Implantable Pulse Generator Implant Date: 20220912
Lead Channel Impedance Value: 390 Ohm
Lead Channel Impedance Value: 590 Ohm
Lead Channel Pacing Threshold Amplitude: 0.75 V
Lead Channel Pacing Threshold Amplitude: 1 V
Lead Channel Pacing Threshold Pulse Width: 0.4 ms
Lead Channel Pacing Threshold Pulse Width: 0.4 ms
Lead Channel Sensing Intrinsic Amplitude: 1.1 mV
Lead Channel Sensing Intrinsic Amplitude: 12 mV
Lead Channel Setting Pacing Amplitude: 2 V
Lead Channel Setting Pacing Amplitude: 2.5 V
Lead Channel Setting Pacing Pulse Width: 0.4 ms
Lead Channel Setting Sensing Sensitivity: 2 mV
Pulse Gen Model: 2272
Pulse Gen Serial Number: 3956585

## 2022-03-17 ENCOUNTER — Other Ambulatory Visit (HOSPITAL_COMMUNITY): Payer: Self-pay | Admitting: Family Medicine

## 2022-03-17 DIAGNOSIS — M546 Pain in thoracic spine: Secondary | ICD-10-CM

## 2022-03-17 DIAGNOSIS — Z96642 Presence of left artificial hip joint: Secondary | ICD-10-CM | POA: Diagnosis not present

## 2022-03-17 DIAGNOSIS — M25562 Pain in left knee: Secondary | ICD-10-CM | POA: Diagnosis not present

## 2022-03-23 ENCOUNTER — Encounter: Payer: Self-pay | Admitting: Internal Medicine

## 2022-03-23 ENCOUNTER — Ambulatory Visit (INDEPENDENT_AMBULATORY_CARE_PROVIDER_SITE_OTHER): Payer: Medicare Other | Admitting: Internal Medicine

## 2022-03-23 VITALS — BP 122/72 | HR 64 | Temp 97.8°F | Resp 12

## 2022-03-23 DIAGNOSIS — Z79899 Other long term (current) drug therapy: Secondary | ICD-10-CM

## 2022-03-23 DIAGNOSIS — M545 Low back pain, unspecified: Secondary | ICD-10-CM

## 2022-03-23 DIAGNOSIS — D649 Anemia, unspecified: Secondary | ICD-10-CM | POA: Diagnosis not present

## 2022-03-23 DIAGNOSIS — I83892 Varicose veins of left lower extremities with other complications: Secondary | ICD-10-CM | POA: Diagnosis not present

## 2022-03-23 DIAGNOSIS — I10 Essential (primary) hypertension: Secondary | ICD-10-CM

## 2022-03-23 DIAGNOSIS — G8929 Other chronic pain: Secondary | ICD-10-CM | POA: Diagnosis not present

## 2022-03-23 DIAGNOSIS — M546 Pain in thoracic spine: Secondary | ICD-10-CM

## 2022-03-23 DIAGNOSIS — E785 Hyperlipidemia, unspecified: Secondary | ICD-10-CM

## 2022-03-23 DIAGNOSIS — Z0000 Encounter for general adult medical examination without abnormal findings: Secondary | ICD-10-CM

## 2022-03-23 DIAGNOSIS — Z0001 Encounter for general adult medical examination with abnormal findings: Secondary | ICD-10-CM | POA: Diagnosis not present

## 2022-03-23 LAB — COMPREHENSIVE METABOLIC PANEL
ALT: 24 U/L (ref 0–35)
AST: 18 U/L (ref 0–37)
Albumin: 3.7 g/dL (ref 3.5–5.2)
Alkaline Phosphatase: 75 U/L (ref 39–117)
BUN: 17 mg/dL (ref 6–23)
CO2: 29 mEq/L (ref 19–32)
Calcium: 10.4 mg/dL (ref 8.4–10.5)
Chloride: 106 mEq/L (ref 96–112)
Creatinine, Ser: 0.69 mg/dL (ref 0.40–1.20)
GFR: 85.59 mL/min (ref >60.00)
Glucose, Bld: 97 mg/dL (ref 70–99)
Potassium: 4.1 mEq/L (ref 3.5–5.1)
Sodium: 141 mEq/L (ref 135–145)
Total Bilirubin: 0.5 mg/dL (ref 0.2–1.2)
Total Protein: 5.8 g/dL — ABNORMAL LOW (ref 6.0–8.3)

## 2022-03-23 LAB — CBC WITH DIFFERENTIAL/PLATELET
Basophils Absolute: 0 10*3/uL (ref 0.0–0.1)
Basophils Relative: 0.9 % (ref 0.0–3.0)
Eosinophils Absolute: 0.1 10*3/uL (ref 0.0–0.7)
Eosinophils Relative: 2 % (ref 0.0–5.0)
HCT: 37.5 % (ref 36.0–46.0)
Hemoglobin: 11.9 g/dL — ABNORMAL LOW (ref 12.0–15.0)
Lymphocytes Relative: 20.4 % (ref 12.0–46.0)
Lymphs Abs: 1.1 10*3/uL (ref 0.7–4.0)
MCHC: 31.9 g/dL (ref 30.0–36.0)
MCV: 81.2 fl (ref 78.0–100.0)
Monocytes Absolute: 0.5 10*3/uL (ref 0.1–1.0)
Monocytes Relative: 9.7 % (ref 3.0–12.0)
Neutro Abs: 3.6 10*3/uL (ref 1.4–7.7)
Neutrophils Relative %: 67 % (ref 43.0–77.0)
Platelets: 282 10*3/uL (ref 150.0–400.0)
RBC: 4.61 Mil/uL (ref 3.87–5.11)
RDW: 21.1 % — ABNORMAL HIGH (ref 11.5–15.5)
WBC: 5.3 10*3/uL (ref 4.0–10.5)

## 2022-03-23 LAB — LIPID PANEL
Cholesterol: 181 mg/dL (ref 0–200)
HDL: 55.9 mg/dL (ref >39.00)
LDL Cholesterol: 97 mg/dL (ref 0–99)
NonHDL: 125.09
Total CHOL/HDL Ratio: 3
Triglycerides: 138 mg/dL (ref 0.0–149.0)
VLDL: 27.6 mg/dL (ref 0.0–40.0)

## 2022-03-23 LAB — FERRITIN: Ferritin: 23 ng/mL (ref 10.0–291.0)

## 2022-03-23 LAB — IRON: Iron: 40 ug/dL — ABNORMAL LOW (ref 42–145)

## 2022-03-23 MED ORDER — VALSARTAN 320 MG PO TABS: ORAL_TABLET | ORAL | Status: DC

## 2022-03-23 MED ORDER — VALSARTAN 160 MG PO TABS: 80.0000 mg | ORAL_TABLET | Freq: Every day | ORAL | 1 refills | Status: DC

## 2022-03-23 NOTE — Assessment & Plan Note (Signed)
-   Td 2023 - pnm shot 2015;  prevnar 2015; PNM 20: 23 -  zostavax 2013: had s/e  severe HA (and fever?) after zostavax ;  reluctant to take shingrex  -  COVID vax utd - RSV 2023 --Female care:  h/o endometrial ca; saw gyn 05-2021, plans to see gyn q year  -Last mammogram 01-2022 ([facility]) --CCS:  multiple cscopes, last 03-2020 ([facility]) next per GI --Diet and exercise discussed --Labs:   CMP FLP CBC iron ferritin -ACP: documents charted

## 2022-03-23 NOTE — Patient Instructions (Addendum)
Check the  blood pressure regularly BP GOAL is between 110/65 and  135/85. If it is consistently higher or lower, let me know  If your pain continue after orthopedics finish the workout please come back   GO TO THE LAB : Get the blood work     [place], [place] back for an checkup in 3 months

## 2022-03-23 NOTE — Assessment & Plan Note (Signed)
Here for CPX HTN: On Lasix, potassium, Aldactone, valsartan: Self decrease dose from 160 mg to 80 mg due to low BP.  BPs are now better.  Checking labs. RFs sent Hyperlipidemia: On Pravachol and colestipol  , Checking labs. Chronic lower edema: Well-controlled Paroxysmal A-fib: Saw cardiology 02/13/2022, on Tikosyn, they changed diltiazem to as needed. Mild anemia: Had some and-V-D, she contacted GI, as C. difficile was negative, symptoms resolved. She is concerned about mild drop in hemoglobin on last CBC so she put herself back on iron supplements. Plan: CBC,   iron ferritin. R thoracic and RUQ pain.  Now under the care of the orthopedic doctor.  They are planning some imaging.  If they do not find a MSK etiology for her pain she will let me know.CT abd? RTC 3 months

## 2022-03-23 NOTE — Progress Notes (Signed)
Subjective:    Patient ID: Veronica Bradford, female    DOB: August 10, 1945, 77 y.o.   MRN: [id number]  DOS:  03/23/2022 Type of visit - description: cpx  Here for CPX BP was low, she self decreased ARB's. A-fib: Since she was started on Tikosyn had a single episode of symptomatic A-fib otherwise denies chest pain or difficulty breathing. Continue with back pain. Had a couple of bouts of nausea , vomiting and diarrhea. Dr Therisa Doyne (GI) rx a c diff test  which was neg. No blood in the stools, no hematemesis.  No further symptoms.    Review of Systems  Other than above, a 14 point review of systems is negative     Past Medical History:  Diagnosis Date   Atrial fibrillation ([place])    Atrial fibrillation ([place])    Back pain    Chronic sinusitis    s/p 2 surgeries remotely, [name]   Closed fracture of metatarsal of left foot    L foot, fifth metatarsal   Ectopic pregnancy    Endometrial cancer ([place]) 2004   Early diagnosis   GERD (gastroesophageal reflux disease)    Hyperlipemia    Hypertension    IBS (irritable bowel syndrome) 08/13/2014   Dx 2015, diarrhea on-off, Dr [name]    Lumbar radiculopathy 08/2012   MRI in 08/2012   Mild hyperparathyroidism ([place])    Osteopenia    PAF (paroxysmal atrial fibrillation) (HCC)    Prediabetes    Sciatica of right side 08/2013   Received Depomedrol 80 mg injection   Sleep apnea 09/09/2013   Dx with OSA in 2015, by Dr. Caryl Comes, APAP    Past Surgical History:  Procedure Laterality Date   ATRIAL FIBRILLATION ABLATION N/A 09/04/2017   Procedure: [place];  Surgeon: [name], MD;  Location: [facility];  Service: Cardiovascular;  Laterality: N/A;   BIOPSY  03/11/2020   Procedure: BIOPSY;  Surgeon: [name], MD;  [facility];  Service: Endoscopy;;  EGD and COLON   CARPAL TUNNEL RELEASE Right 06/17/2019   CARPAL TUNNEL RELEASE Left 11/2020   CHOLECYSTECTOMY  ~2006   COLONOSCOPY WITH PROPOFOL N/A  03/11/2020   Procedure: COLONOSCOPY WITH PROPOFOL;  Surgeon: [name], MD;  [facility];  Service: Endoscopy;  Laterality: N/A;   DILATION AND CURETTAGE OF UTERUS     after SAB   ESOPHAGOGASTRODUODENOSCOPY  02/09/2014   Dr. 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/2020   Procedure: PACEMAKER IMPLANT;  Surgeon: [name], MD;  Location: [facility];  Service: Cardiovascular;  Laterality: N/A;   TONSILLECTOMY     TOTAL ABDOMINAL HYSTERECTOMY W/ BILATERAL SALPINGOOPHORECTOMY     TOTAL HIP ARTHROPLASTY Left 07/18/2021   Procedure: TOTAL HIP ARTHROPLASTY ANTERIOR APPROACH;  Surgeon: Rod Can, MD;  [facility];  Service: Orthopedics;  Laterality: Left;   TOTAL KNEE ARTHROPLASTY Right ~2008   Social History   Socioeconomic History   Marital status: Married    Spouse name: Not on file   Number of children: 1   Years of education: Not on file   Highest education level: Not on file  Occupational History  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  Tobacco Use   Smoking status: Former    Types: Cigarettes    Quit date: 01/06/1980    Years since quitting: 42.2   Smokeless tobacco: Never   Tobacco comments:    smoked from [place] to 1982, less than 1 ppd  Vaping Use   Vaping Use: Never used  Substance and Sexual Activity   Alcohol use: Yes    Comment: rare   Drug use: No   Sexual activity: Not Currently    Birth control/protection: None  Other Topics Concern   Not on file  Social History Narrative   Lives w/ husband, and son [name]   Retired Marine scientist   Lives in [place] Strain: [place]  (06/08/2020)    Overall Financial Resource Strain ([place])    Difficulty of Paying Living Expenses: Not hard at all  Food Insecurity: No [place] (01/09/2022)   Hunger Vital Sign    Worried About Running Out of Food in the Last Year: Never true    [place] in the Last Year: Never true  Transportation Needs: No Transportation Needs (01/09/2022)   PRAPARE - Hydrologist (Medical): No    Lack of Transportation (Non-Medical): No  Physical Activity: Sufficiently Active (06/08/2020)   Exercise Vital Sign    Days of Exercise per Week: 7 days    Minutes of Exercise per Session: 30 min  Stress: No Stress Concern Present (06/08/2020)   [place]    Feeling of Stress : Not at all  Social Connections: Moderately Integrated (06/08/2020)   Social Connection and Isolation Panel [NHANES]    Frequency of Communication with Friends and Family: More than three times a week    Frequency of Social Gatherings with Friends and Family: Once a week    Attends Religious Services: Never    Marine scientist or Organizations: Yes    Attends Music therapist: More than 4 times per year    Marital Status: Married  Human resources officer Violence: Not At Risk (01/03/2022)   Humiliation, Afraid, Rape, and Kick questionnaire    Fear of Current or Ex-Partner: No    Emotionally Abused: No    Physically Abused: No    Sexually Abused: No     Current Outpatient Medications  Medication Instructions   acetaminophen (TYLENOL) 500 mg, Oral, Every 6 hours PRN   acetaminophen-codeine (TYLENOL #3) 300-30 MG tablet 1 tablet, Oral, At bedtime PRN   ARTIFICIAL TEAR SOLUTION OP 1 drop, Both Eyes, 4 times daily PRN   cephALEXin (KEFLEX) 2,000 mg, Oral, Once PRN   cholecalciferol (VITAMIN D3) 1,000 Units, Oral, Every morning   Chromium Picolinate 500 mcg, Oral, Daily after supper   Coenzyme Q10 (CO Q 10) 100 MG CAPS 1 capsule, Oral,  Daily   colestipol (COLESTID) 1 g, Oral, 2 times daily PRN   CRANBERRY EXTRACT PO 2 tablets, Oral, Daily at bedtime   Cyanocobalamin (VITAMIN B 12 PO) 1 tablet, Oral, Daily   diltiazem (CARDIZEM) 30 MG tablet Take 1 tablet every 4 hours AS NEEDED for heart rate >100 as long as blood pressure >100.   dofetilide (TIKOSYN) 250 mcg, Oral, 2 times daily   estradiol (ESTRACE) 0.1 MG/GM vaginal cream 1 Applicatorful, Vaginal, 3 times weekly   fluticasone (FLONASE) 50 MCG/ACT nasal spray 1 spray, Each Nare, Daily   FOLIC ACID  PO 800 mcg, Oral, Every morning   furosemide (LASIX) 40 mg, Oral, As needed   magnesium oxide (MAG-OX) 400 mg, Oral, 2 times daily   Olopatadine HCl (PATADAY OP) 1 drop, Both Eyes, Daily PRN   Omega-3 Fatty Acids (FISH OIL PO) 1 tablet, Oral, Every evening   pantoprazole (PROTONIX) 40 mg, Oral, 2 times daily   potassium chloride SA (KLOR-CON M) 20 MEQ tablet 20 mEq, Oral, Daily   pravastatin (PRAVACHOL) 40 mg, Oral, Daily at bedtime   rivaroxaban (XARELTO) 20 mg, Oral, Daily with supper   spironolactone (ALDACTONE) 25 MG tablet TAKE 1 TABLET(25 MG) BY MOUTH DAILY   sucralfate (CARAFATE) 1 g, Oral, 2 times daily   valsartan (DIOVAN) 80 mg, Oral, Daily       Objective:   Physical Exam BP 122/72 (BP Location: Right Arm, Cuff Size: Large)   Pulse 64   Temp 97.8 F (36.6 C) (Oral)   Resp 12   SpO2 98%  General: Well developed, NAD, BMI noted Neck: No  thyromegaly  HEENT:  Normocephalic . Face symmetric, atraumatic Lungs:  CTA B Normal respiratory effort, no intercostal retractions, no accessory muscle use. Heart: RRR,  no murmur.  Abdomen:  Not distended, soft, non-tender. No rebound or rigidity.   Lower extremities: Has compression stockings, trace edema on the left pretibial area, no edema on the right side. Skin: Exposed areas without rash. Not pale. Not jaundice Neurologic:  alert & oriented X3.  Speech normal, gait appropriate for age and  unassisted Strength symmetric and appropriate for age.  Psych: Cognition and judgment appear intact.  Cooperative with normal attention span and concentration.  Behavior appropriate. No anxious or depressed appearing.     Assessment    Assessment: Prediabetes HTN Hyperlipidemia Chronic lower extremity edema L>R (eval by previous pcp, related to varicose veins?) CV:  -- P. Atrial fibrillation, A. flutter: on xarelto flecainide-diltiazem  prn --Ablation 09/2017 --Pacemaker 09-2020 GI: Dr [name]  --GERD (zantac or protonix prn), IBS, chronic diarrhea --GI records: [place], 2004, 2009 8 and 07-2013. Negative for polyps or IBD. + Diverticuli. EGD/cscope 11/2016; EGD/Cscope 03/11/2020   --Increased LFTs: CT abdomen 05/2016: Normal liver. 05/2017: WNL  Hepatitis B and C, Alpha 1 antitrypsin, anti-smooth muscle antibody, ANA trans-ferritin  OSA--- CPAP, Dr. Maxwell Caul Osteopenia:  Multiple  Dexas, last few:  T score 2009  -1.1; T score 2015  -0.7; T score 05/2017 -1.8 DJD --Chronic back pain-- tylenol #3 prn --- RF per PCP --history of epidurals in the past, MRI 2014: Scoliosis,DJD --CIPRO pre dental d/t knee replacement (rx by ortho) H/o endometrial cancer, found during a hysterectomy, no chemotherapy or XRT.  Primary hyperparathyroidism: Chronic hypercalcemia, increased PTH 05/2016, refered to ENT  PLAN:  Here for CPX HTN: On Lasix, potassium, Aldactone, valsartan: Self decrease dose from 160 mg to 80 mg due to low BP.  BPs are now better.  Checking labs. RFs sent Hyperlipidemia: On Pravachol and colestipol  , Checking labs. Chronic lower edema: Well-controlled Paroxysmal A-fib: Saw cardiology 02/13/2022, on Tikosyn, they changed diltiazem to as needed. Mild anemia: Had some and-V-D, she contacted GI, as C. difficile was negative, symptoms resolved. She is concerned about mild drop in hemoglobin on last CBC so she put herself back on iron supplements. Plan: CBC,   iron  ferritin. R thoracic and RUQ pain.  Now under the care of the orthopedic doctor.  They are planning some imaging.  If they do not find a MSK etiology for her pain  she will let me know. CT abd? RTC 3 months  In addition to CPX, chronic medical problems were managed, extensive discussion about right thoracic pain.  Will proceed with further evaluation from my side if Ortho does not find a  MSK etiology.

## 2022-03-25 LAB — DRUG MONITORING PANEL 375977 , URINE
Alcohol Metabolites: NEGATIVE ng/mL (ref <500)
Amphetamines: NEGATIVE ng/mL (ref <500)
Barbiturates: NEGATIVE ng/mL (ref <300)
Benzodiazepines: NEGATIVE ng/mL (ref <100)
Cocaine Metabolite: NEGATIVE ng/mL (ref <150)
Codeine: 357 ng/mL — ABNORMAL HIGH (ref <50)
Desmethyltramadol: NEGATIVE ng/mL (ref <100)
Hydrocodone: NEGATIVE ng/mL (ref <50)
Hydromorphone: NEGATIVE ng/mL (ref <50)
Marijuana Metabolite: NEGATIVE ng/mL (ref <20)
Morphine: 267 ng/mL — ABNORMAL HIGH (ref <50)
Norhydrocodone: NEGATIVE ng/mL (ref <50)
Opiates: POSITIVE ng/mL — AB (ref <100)
Oxycodone: NEGATIVE ng/mL (ref <100)
Tramadol: NEGATIVE ng/mL (ref <100)

## 2022-03-25 LAB — DM TEMPLATE

## 2022-04-01 ENCOUNTER — Other Ambulatory Visit: Payer: Self-pay | Admitting: Internal Medicine

## 2022-04-14 DIAGNOSIS — M1712 Unilateral primary osteoarthritis, left knee: Secondary | ICD-10-CM | POA: Diagnosis not present

## 2022-04-14 DIAGNOSIS — I8002 Phlebitis and thrombophlebitis of superficial vessels of left lower extremity: Secondary | ICD-10-CM | POA: Diagnosis not present

## 2022-04-14 DIAGNOSIS — Z96642 Presence of left artificial hip joint: Secondary | ICD-10-CM | POA: Diagnosis not present

## 2022-04-17 DIAGNOSIS — I87322 Chronic venous hypertension (idiopathic) with inflammation of left lower extremity: Secondary | ICD-10-CM | POA: Diagnosis not present

## 2022-04-17 DIAGNOSIS — M7989 Other specified soft tissue disorders: Secondary | ICD-10-CM | POA: Diagnosis not present

## 2022-04-17 DIAGNOSIS — I83812 Varicose veins of left lower extremities with pain: Secondary | ICD-10-CM | POA: Diagnosis not present

## 2022-04-24 NOTE — Progress Notes (Signed)
Remote pacemaker transmission.   

## 2022-04-25 ENCOUNTER — Encounter: Payer: Self-pay | Admitting: Family Medicine

## 2022-04-25 ENCOUNTER — Ambulatory Visit (INDEPENDENT_AMBULATORY_CARE_PROVIDER_SITE_OTHER): Payer: Medicare Other | Admitting: Family Medicine

## 2022-04-25 VITALS — BP 126/42 | Ht 66.0 in | Wt 251.0 lb

## 2022-04-25 DIAGNOSIS — R350 Frequency of micturition: Secondary | ICD-10-CM | POA: Diagnosis not present

## 2022-04-25 DIAGNOSIS — R300 Dysuria: Secondary | ICD-10-CM

## 2022-04-25 DIAGNOSIS — R109 Unspecified abdominal pain: Secondary | ICD-10-CM

## 2022-04-25 LAB — POC URINALSYSI DIPSTICK (AUTOMATED)
Bilirubin, UA: NEGATIVE
Blood, UA: NEGATIVE
Glucose, UA: NEGATIVE
Ketones, UA: NEGATIVE
Leukocytes, UA: NEGATIVE
Nitrite, UA: NEGATIVE
Protein, UA: NEGATIVE
Spec Grav, UA: <=1.005 — AB (ref 1.010–1.025)
Urobilinogen, UA: 0.2 E.U./dL
pH, UA: 7 (ref 5.0–8.0)

## 2022-04-25 NOTE — Patient Instructions (Signed)
Urine sample is normal today, but we will send it off for culture and let you know results in a few days. In the meantime, stay well hydrated, avoid bladder irritants.   Please contact office for follow-up if symptoms do not improve or worsen. Seek emergency care if symptoms become severe.

## 2022-04-25 NOTE — Progress Notes (Signed)
   Acute Office Visit  Subjective:     Patient ID: Veronica Bradford, female    DOB: 02-06-1945, 77 y.o.   MRN: [id number]  Chief Complaint  Patient presents with   Dysuria       Urinary Tract Infection: Patient complains of burning with urination, dysuria, frequency, suprapubic pressure, and urgency She has had symptoms for 2 days.  Patient denies back pain, fever, stomach ache, and vaginal discharge, hematuria.  Patient does have a history of recurrent UTI.   She just finished a course of doxycycline for lower extremity inflammation (following with vascular).     All review of systems negative except what is listed in the HPI      Objective:    BP (!) 126/42   Ht  (1.676 m)   Wt 251 lb (113.9 kg)   BMI 40.51 kg/m    Physical Exam Vitals reviewed.  Constitutional:      Appearance: Normal appearance.  Cardiovascular:     Rate and Rhythm: Normal rate and regular rhythm.     Pulses: Normal pulses.     Heart sounds: Normal heart sounds.  Pulmonary:     Effort: Pulmonary effort is normal.     Breath sounds: Normal breath sounds.  Abdominal:     Palpations: Abdomen is soft.     Tenderness: There is no abdominal tenderness. There is no right CVA tenderness or left CVA tenderness.  Skin:    General: Skin is warm and dry.  Neurological:     Mental Status: She is alert and oriented to person, place, and time.  Psychiatric:        Mood and Affect: Mood normal.        Behavior: Behavior normal.        Thought Content: Thought content normal.        Judgment: Judgment normal.     Results for orders placed or performed in visit on 04/25/22  POCT Urinalysis Dipstick (Automated)  Result Value Ref Range   Color, UA yellow    Clarity, UA clear    Glucose, UA Negative Negative   Bilirubin, UA negative    Ketones, UA negative    Spec Grav, UA <=1.005 (A) 1.010 - 1.025   Blood, UA negative    pH, UA 7.0 5.0 - 8.0   Protein, UA Negative Negative   Urobilinogen, UA  0.2 0.2 or 1.0 E.U./dL   Nitrite, UA negative    Leukocytes, UA Negative Negative        Assessment & Plan:   Problem List Items Addressed This Visit   None Visit Diagnoses     Dysuria    -  Primary   Relevant Orders   POCT Urinalysis Dipstick (Automated) (Completed)   Urine Culture   Urinary frequency       Relevant Orders   POCT Urinalysis Dipstick (Automated) (Completed)   Urine Culture   Abdominal pressure       Relevant Orders   POCT Urinalysis Dipstick (Automated) (Completed)   Urine Culture     Urine sample is normal today, but we will send it off for culture and let you know results in a few days. In the meantime, stay well hydrated, avoid bladder irritants.  Patient aware of signs/symptoms requiring further/urgent evaluation.   No orders of the defined types were placed in this encounter.   Return if symptoms worsen or fail to improve.  Clayborne Dana, NP

## 2022-04-26 LAB — URINE CULTURE
MICRO NUMBER:: 14861854
Result:: NO GROWTH
SPECIMEN QUALITY:: ADEQUATE

## 2022-05-01 ENCOUNTER — Other Ambulatory Visit: Payer: Self-pay | Admitting: Internal Medicine

## 2022-05-03 NOTE — Progress Notes (Signed)
Veronica Bradford, female    DOB: 1945-02-16   MRN: [id number]    Brief patient profile:  55 yowf quit smoking 1982 s/p hysterectomy stage 1 adenocarcinoma around 2004 and has kept up with coloscopy and mammography  Referred by Dr Tillie Rung for incidental MPNs on CT heart scan.    History of Present Illness  12/06/2017  Pulmonary/ 1st office eval/Riverlyn Kizziah   feels overall the same for years, no wt loss, no known active primary ca or for above or h/o collagen vasc dz   Dyspnea:  Not limited by breathing from desired activities  / cardio at gym limited by knees  Cough: none Sleep: on cpap / head flat  SABA use: none  Rec No f/u needed      05/04/2022  Re-establish ov/Mathius Birkeland re: CP/ OSA  maint on cpap autoset  by Dr Earl Gala previously  Chief Complaint  Patient presents with   Consult    Needs MD for follow up of CPAP.  Having chest pain in thoracic area of back.  Has seen orthopedic for back pain with physical therapy.  Hx of uterine cancer and "spots on lungs" nodules on CT scan from 2020.  R post chest wall pain x Oct 2023 mostly related to  position changes s shoulder issues  Dyspnea:  very inactive but does grocery / driveway x  15 min at "walking the dog rate" s cp   Cough: none  Sleeping: bed is electric, 5-10 degrees and does fine once comfortable related to chest wall pain   SABA use: none  02: none     No obvious day to day or daytime variability or assoc excess/ purulent sputum or mucus plugs or hemoptysis or  chest tightness, subjective wheeze or overt sinus or hb symptoms.   Sleeping as above without nocturnal  or early am exacerbation  of respiratory  c/o's or need for noct saba. Also denies any obvious fluctuation of symptoms with weather or environmental changes or other aggravating or alleviating factors except as outlined above   No unusual exposure hx or h/o childhood pna/ asthma or knowledge of premature birth.  Current Allergies, Complete Past Medical History, Past Surgical  History, Family History, and Social History were reviewed in [employer] record.  ROS  The following are not active complaints unless bolded Hoarseness, sore throat, dysphagia, dental problems, itching, sneezing,  nasal congestion or discharge of excess mucus or purulent secretions, ear ache,   fever, chills, sweats, unintended wt loss or wt gain, classically pleuritic or exertional cp,  orthopnea pnd or arm/hand swelling  or leg swelling L>R under care of vein specialists, wears elastic support , presyncope, palpitations, abdominal pain, anorexia, nausea, vomiting, diarrhea  or change in bowel habits or change in bladder habits, change in stools or change in urine, dysuria, hematuria,  rash, arthralgias, visual complaints, headache, numbness, weakness or ataxia or problems with walking or coordination,  change in mood or  memory.        Current Meds  Medication Sig   acetaminophen (TYLENOL) 500 MG tablet Take 500 mg by mouth every 6 (six) hours as needed for moderate pain or headache.   acetaminophen-codeine (TYLENOL #3) 300-30 MG tablet TAKE 1 TABLET BY MOUTH AT BEDTIME AS NEEDED FOR MODERATE PAIN   ARTIFICIAL TEAR SOLUTION OP Place 1 drop into both eyes 4 (four) times daily as needed (dry/irritated eyes.).   cephALEXin (KEFLEX) 500 MG capsule Take 4 capsules (2,000 mg total) by mouth once  as needed for up to 1 dose (30-60 minutes prior to dental procedures).   cholecalciferol (VITAMIN D3) 25 MCG (1000 UNIT) tablet Take 1,000 Units by mouth in the morning.   Chromium Picolinate 1000 MCG TABS Take 500 mcg by mouth daily after supper.   Coenzyme Q10 (CO Q 10) 100 MG CAPS Take 1 capsule by mouth daily.   colestipol (COLESTID) 1 g tablet Take 1 g by mouth 2 (two) times daily as needed.   CRANBERRY EXTRACT PO Take 2 tablets by mouth at bedtime.   Cyanocobalamin (VITAMIN B 12 PO) Take 1 tablet by mouth daily.   diltiazem (CARDIZEM) 30 MG tablet Take 1 tablet every 4 hours AS  NEEDED for heart rate >100 as long as blood pressure >100. (Patient taking differently: Take 30 mg by mouth every 4 (four) hours. Take 1 tablet every 4 hours AS NEEDED for heart rate >100 as long as blood pressure >100. Take every 4 hours until next daily dose.)   dofetilide (TIKOSYN) 250 MCG capsule Take 1 capsule (250 mcg total) by mouth 2 (two) times daily.   estradiol (ESTRACE) 0.1 MG/GM vaginal cream Place 1 Applicatorful vaginally 3 (three) times a week.   Ferrous Sulfate (IRON PO) Take 1 tablet by mouth daily. For anemia   fluticasone (FLONASE) 50 MCG/ACT nasal spray Place 1 spray into both nostrils daily.   FOLIC ACID PO Take 800 mcg by mouth in the morning.   furosemide (LASIX) 40 MG tablet Take 1 tablet (40 mg total) by mouth as needed.   magnesium gluconate (MAGONATE) 500 MG tablet Take 500 mg by mouth 2 (two) times daily.   Olopatadine HCl (PATADAY OP) Place 1 drop into both eyes daily as needed.   Omega-3 Fatty Acids (FISH OIL PO) Take 1 tablet by mouth every evening.   pantoprazole (PROTONIX) 40 MG tablet Take 1 tablet (40 mg total) by mouth 2 (two) times daily.   potassium chloride SA (KLOR-CON M) 20 MEQ tablet Take 1 tablet (20 mEq total) by mouth daily.   pravastatin (PRAVACHOL) 40 MG tablet Take 1 tablet (40 mg total) by mouth at bedtime.   rivaroxaban (XARELTO) 20 MG TABS tablet Take 1 tablet (20 mg total) by mouth daily with supper.   spironolactone (ALDACTONE) 25 MG tablet TAKE 1 TABLET(25 MG) BY MOUTH DAILY   sucralfate (CARAFATE) 1 g tablet Take 1 g by mouth 2 (two) times daily.   valsartan (DIOVAN) 160 MG tablet Take 0.5 tablets (80 mg total) by mouth daily.                     Past Medical History:  Diagnosis Date   Back pain    Chronic sinusitis    s/p 2 surgeries remotely, Dr Ezzard Standing   Closed fracture of metatarsal of left foot    L foot, fifth metatarsal   Ectopic pregnancy    Endometrial cancer (HCC) 2004   Early diagnosis   GERD (gastroesophageal reflux  disease)    Hyperlipemia    Hypertension    IBS (irritable bowel syndrome) 08/13/2014   Dx 2015, diarrhea on-off, Dr Matthias Hughs    Lumbar radiculopathy 08/2012   MRI in 08/2012   Mild depression [facility])    Pt's son has Down's Syndrome   Mild hyperparathyroidism (HCC)    Osteopenia    PAF (paroxysmal atrial fibrillation) (HCC)    Prediabetes    Sciatica of right side 08/2013   Received Depomedrol 80 mg injection   Sleep  apnea 09/09/2013   Dx with OSA in 2015, by Dr. Graciela Husbands, APAP       Objective:      Wt Readings from Last 3 Encounters:  05/04/22 254 lb 9.6 oz (115.5 kg)  04/25/22 251 lb (113.9 kg)  02/13/22 245 lb (111.1 kg)      Vital signs reviewed  05/04/2022  - Note at rest 02 sats  100% on RA   General appearance:    amb mod obese wf nad       Vital signs reviewed - Note on arrival 02 sats  98% on RA   HEENT : Oropharynx  clear        NECK :  without  apparent JVD/ palpable Nodes/TM    LUNGS: no acc muscle use,  Nl contour chest which is clear to A and P bilaterally without cough on insp or exp maneuvers/ min tenderness just below scapula on R, from R shoulder/ no rash    CV:  RRR  no s3 or murmur or increase in P2, and   Pitting  edema  L slt  > R both LEs with elastic hose in place  ABD: obese  soft and nontender   MS:  Nl gait/ ext warm without deformities Or obvious joint restrictions  calf tenderness, cyanosis or clubbing    SKIN: warm and dry without lesions    NEURO:  alert, approp, nl sensorium with  no motor or cerebellar deficits apparent.       I personally reviewed images and agree with radiology impression as follows:  CXR:   PA and Lateral 10/26/21  No acute cardiopulmonary abnormality.       Assessment

## 2022-05-04 ENCOUNTER — Ambulatory Visit: Payer: Medicare Other | Admitting: Internal Medicine

## 2022-05-04 ENCOUNTER — Encounter: Payer: Self-pay | Admitting: Internal Medicine

## 2022-05-04 VITALS — BP 124/60 | HR 65 | Temp 97.8°F | Ht 68.0 in | Wt 254.6 lb

## 2022-05-04 DIAGNOSIS — R0609 Other forms of dyspnea: Secondary | ICD-10-CM | POA: Diagnosis not present

## 2022-05-04 DIAGNOSIS — R918 Other nonspecific abnormal finding of lung field: Secondary | ICD-10-CM

## 2022-05-04 DIAGNOSIS — R079 Chest pain, unspecified: Secondary | ICD-10-CM

## 2022-05-04 DIAGNOSIS — G4733 Obstructive sleep apnea (adult) (pediatric): Secondary | ICD-10-CM

## 2022-05-04 NOTE — Assessment & Plan Note (Addendum)
Download 05/04/2022   30/30  Day  Autoset 5/15 AHI  2.5 > supplies sent/ f/u with Vassie Loll who sees her husband as well

## 2022-05-04 NOTE — Patient Instructions (Signed)
Will order your supplies for the year and arrange for you to see Dr Vassie Loll in one year for OSA   My office will be contacting you by phone for referral to CT without contrast at [facility].  - if you don't hear back from my office within one week please call us back or notify us thru MyChart and we'll address it right away.   Zostrix cream 4 x daily to the area of pain on your back should help over the next few weeks taper to bedtime use.

## 2022-05-04 NOTE — Assessment & Plan Note (Signed)
See CT chest 08/28/17 all < 5 mm - ESR 12/06/2017  16  - GOLD TB plus 12/06/2017  neg - F/u ct chest 08/23/18 3 mm only > no f/u indicated - new R CP Oct 2023at inf portion of scapula occ rad to R flank >  05/04/2022 rec zostrix / repeat Chest CT s contrast  The chronicity of pain and radiation pattern strongly suggest neuralgia and ortho wanted to do MRI but can't due to pacemaker so a plain CT chest is as much as we can do   Discussed in detail all the  indications, usual  risks and alternatives  relative to the benefits with patient who agrees to proceed with w/u as outlined.

## 2022-05-05 ENCOUNTER — Encounter: Payer: Self-pay | Admitting: Internal Medicine

## 2022-05-05 NOTE — Assessment & Plan Note (Signed)
Onset  ?  - Echo 02/11/21 1. Left ventricular ejection fraction, by estimation, is 65 to 70%. The  left ventricle has normal function. The left ventricle has no regional  wall motion abnormalities. There is moderate asymmetric left ventricular  hypertrophy of the basal-septal  segment. Left ventricular diastolic parameters are indeterminate.   2. Right ventricular systolic function is normal. The right ventricular  size is mildly enlarged. There is normal pulmonary artery systolic  pressure. The estimated right ventricular systolic pressure is 30.2 mmHg.   3. The mitral valve is normal in structure. Trivial mitral valve  regurgitation. No evidence of mitral stenosis.   4. The aortic valve is tricuspid. Aortic valve regurgitation is not  visualized. No aortic stenosis is present.   5. The inferior vena cava is normal in size with greater than 50%  respiratory variability, suggesting right atrial pressure of 3 mmHg.  - 05/04/2022   Walked on RA  x  3  lap(s) =  approx 750  ft  @ mod pace, stopped due to end of study  with lowest 02 sats 98% with very mild sob at very end ("walking faster than she does with dog")    Mostly likely this is a combination of obesity/ deconditioning and afib but no evidence of ILD / airways dz or PE (on xarelto already for afib).  Rec sub max exercise as tol up to 30 min daily/ f/u with Dr Graciela Husbands as planned   Each maintenance medication was reviewed in detail including emphasizing most importantly the difference between maintenance and prns and under what circumstances the prns are to be triggered using an action plan format where appropriate.  Total time for H and P, chart review, counseling,  directly observing portions of ambulatory 02 saturation study/ and generating customized AVS unique to this office visit / same day charting = 46 min with pt not seen in > 3 y

## 2022-05-08 DIAGNOSIS — M7989 Other specified soft tissue disorders: Secondary | ICD-10-CM | POA: Diagnosis not present

## 2022-05-08 DIAGNOSIS — I83812 Varicose veins of left lower extremities with pain: Secondary | ICD-10-CM | POA: Diagnosis not present

## 2022-05-12 ENCOUNTER — Ambulatory Visit (HOSPITAL_BASED_OUTPATIENT_CLINIC_OR_DEPARTMENT_OTHER)
Admission: RE | Admit: 2022-05-12 | Discharge: 2022-05-12 | Disposition: A | Discharge status: Home / Self Care | Payer: Medicare Other | Source: Ambulatory Visit | Attending: Internal Medicine | Admitting: Internal Medicine

## 2022-05-12 DIAGNOSIS — R918 Other nonspecific abnormal finding of lung field: Secondary | ICD-10-CM | POA: Diagnosis not present

## 2022-05-12 DIAGNOSIS — R079 Chest pain, unspecified: Secondary | ICD-10-CM | POA: Diagnosis not present

## 2022-05-15 ENCOUNTER — Encounter: Payer: Self-pay | Admitting: Internal Medicine

## 2022-05-15 DIAGNOSIS — I8311 Varicose veins of right lower extremity with inflammation: Secondary | ICD-10-CM | POA: Diagnosis not present

## 2022-05-15 DIAGNOSIS — R600 Localized edema: Secondary | ICD-10-CM | POA: Diagnosis not present

## 2022-05-15 DIAGNOSIS — I8312 Varicose veins of left lower extremity with inflammation: Secondary | ICD-10-CM | POA: Diagnosis not present

## 2022-05-15 DIAGNOSIS — I890 Lymphedema, not elsewhere classified: Secondary | ICD-10-CM | POA: Diagnosis not present

## 2022-05-17 ENCOUNTER — Other Ambulatory Visit: Payer: Self-pay | Admitting: Internal Medicine

## 2022-05-22 NOTE — Progress Notes (Signed)
Spoke with pt and notified of results per Dr. Wert. Pt verbalized understanding and denied any questions. 

## 2022-05-24 ENCOUNTER — Encounter: Payer: Self-pay | Admitting: Obstetrics & Gynecology

## 2022-05-24 ENCOUNTER — Ambulatory Visit (INDEPENDENT_AMBULATORY_CARE_PROVIDER_SITE_OTHER): Payer: Medicare Other | Admitting: Obstetrics & Gynecology

## 2022-05-24 VITALS — BP 136/46 | HR 68 | Ht 67.0 in | Wt 240.0 lb

## 2022-05-24 DIAGNOSIS — N952 Postmenopausal atrophic vaginitis: Secondary | ICD-10-CM | POA: Diagnosis not present

## 2022-05-24 DIAGNOSIS — M1990 Unspecified osteoarthritis, unspecified site: Secondary | ICD-10-CM | POA: Diagnosis not present

## 2022-05-24 DIAGNOSIS — Z01419 Encounter for gynecological examination (general) (routine) without abnormal findings: Secondary | ICD-10-CM

## 2022-05-24 MED ORDER — ESTRADIOL 0.1 MG/GM VA CREA
1.0000 | TOPICAL_CREAM | VAGINAL | 12 refills | Status: DC
Start: 2022-05-24 — End: 2022-11-10

## 2022-05-24 NOTE — Progress Notes (Signed)
Subjective:     Veronica Bradford is a 77 y.o. female here for a routine exam.  Pt is a retired Insurance underwriter. Current complaints: She reports that since her last visit, she has a fall at home and is s/p a hip replacement. We discussed her primary care physician.       Gynecologic History No LMP recorded. Patient has had a hysterectomy. Contraception: post menopausal status Last Pap: Pt is s/p hysterectomy  Last mammogram: 01/25/2022. Results were: normal  Obstetric History OB History  Gravida Para Term Preterm AB Living  4 1 1   3 1   SAB IAB Ectopic Multiple Live Births  2   1   1     # Outcome Date GA Lbr Len/2nd Weight Sex Delivery Anes PTL Lv  4 SAB 1989          3 SAB 1986          2 Term 58 [redacted]w[redacted]d   M Vag-Spont None  LIV  1 Ectopic 1980             The following portions of the patient's history were reviewed and updated as appropriate: allergies, current medications, past family history, past medical history, past social history, past surgical history, and problem list.  Review of Systems Pertinent items are noted in HPI.    Objective:  Blood pressure (!) 136/46, pulse 68, height 5\' 7"  (1.702 m), weight 240 lb (108.9 kg).  General Appearance:    Alert, cooperative, no distress, appears stated age  Head:    Normocephalic, without obvious abnormality, atraumatic  Eyes:    conjunctiva/corneas clear, EOM's intact, both eyes  Ears:    Normal external ear canals, both ears  Nose:   Nares normal, septum midline, mucosa normal, no drainage    or sinus tenderness  Throat:   Lips, mucosa, and tongue normal; teeth and gums normal  Neck:   Supple, symmetrical, trachea midline, no adenopathy;    thyroid:  no enlargement/tenderness/nodules  Back:     Symmetric, no curvature, ROM normal, no CVA tenderness  Lungs:     respirations unlabored  Chest Wall:    No tenderness or deformity   Heart:    Regular rate and rhythm  Breast Exam:    No tenderness, masses, or nipple abnormality  Abdomen:      Soft, non-tender, bowel sounds active all four quadrants,    no masses, no organomegaly  Genitalia:    Normal female without lesion, discharge or tenderness   S/p hyst. Cervix and uterus surgically absent.    Extremities:   Extremities normal, atraumatic, no cyanosis or edema  Pulses:   2+ and symmetric all extremities  Skin:   Skin color, texture, turgor normal, no rashes or lesions    Assessment:    Healthy female exam.    Plan:  Diagnoses and all orders for this visit:  Postmenopause atrophic vaginitis -     estradiol (ESTRACE) 0.1 MG/GM vaginal cream; Place 1 Applicatorful vaginally 3 (three) times a week.  Osteoarthritis, unspecified osteoarthritis type, unspecified site -     HM DEXA SCAN   F/u in 1 year or sooner prn   Carlon Davidson L. Harraway-Smith, M.D., [place]

## 2022-05-26 ENCOUNTER — Encounter: Payer: Self-pay | Admitting: Internal Medicine

## 2022-05-29 ENCOUNTER — Encounter: Payer: Self-pay | Admitting: Obstetrics & Gynecology

## 2022-06-05 ENCOUNTER — Encounter: Payer: Self-pay | Admitting: Obstetrics & Gynecology

## 2022-06-06 ENCOUNTER — Ambulatory Visit (INDEPENDENT_AMBULATORY_CARE_PROVIDER_SITE_OTHER): Payer: Medicare Other | Admitting: Family Medicine

## 2022-06-06 ENCOUNTER — Encounter: Payer: Self-pay | Admitting: Family Medicine

## 2022-06-06 VITALS — BP 136/68 | HR 64 | Resp 18 | Ht 67.0 in | Wt 253.4 lb

## 2022-06-06 DIAGNOSIS — H1031 Unspecified acute conjunctivitis, right eye: Secondary | ICD-10-CM | POA: Diagnosis not present

## 2022-06-06 MED ORDER — ERYTHROMYCIN 5 MG/GM OP OINT
1.0000 | TOPICAL_OINTMENT | Freq: Every day | OPHTHALMIC | 0 refills | Status: DC
Start: 2022-06-06 — End: 2022-07-24

## 2022-06-06 NOTE — Progress Notes (Signed)
Chief Complaint  Patient presents with   Eye Pain    Itchy eye , runny eye onset: 2 weeks    Veronica Bradford is here for right eye irritation.  Duration: 2 weeks Chemical exposure? No  Recent URI? No  Contact lenses? No  History of allergies? Yes - mold/rag weed Treatment to date: Lubricant eye drops, Pataday, warm compresses, Astelin, Flonase Drainage? Yes  Past Medical History:  Diagnosis Date   Atrial fibrillation (HCC)    Atrial fibrillation (HCC)    Back pain    Chronic sinusitis    s/p 2 surgeries remotely, Dr Ezzard Standing   Closed fracture of metatarsal of left foot    L foot, fifth metatarsal   Ectopic pregnancy    Endometrial cancer (HCC) 2004   Early diagnosis   GERD (gastroesophageal reflux disease)    Hyperlipemia    Hypertension    IBS (irritable bowel syndrome) 08/13/2014   Dx 2015, diarrhea on-off, Dr Matthias Hughs    Lumbar radiculopathy 08/2012   MRI in 08/2012   Mild hyperparathyroidism (HCC)    Osteopenia    PAF (paroxysmal atrial fibrillation) (HCC)    Prediabetes    Sciatica of right side 08/2013   Received Depomedrol 80 mg injection   Sleep apnea 09/09/2013   Dx with OSA in 2015, by Dr. Graciela Husbands, APAP   Family History  Problem Relation Age of Onset   Hypertension Mother    Colon cancer Mother    Breast cancer Mother    Ovarian cancer Mother    Hypertension Father    Diabetes Father    Head & neck cancer Sister    CAD Neg Hx     BP 136/68   Pulse 64   Resp 18   Ht 5\' 7"  (1.702 m)   Wt 253 lb 6.4 oz (114.9 kg)   SpO2 99%   BMI 39.69 kg/m  Gen: Awake, alert, appears stated age Eyes: Lid on R injected on lower, WNL on L, Sclera clear, PERRLA, EOMi Nose: Nares patent without discharge Mouth: MMM, pharynx without erythema or exudate Psych: Age appropriate judgment and insight; mood and affect normal  Acute conjunctivitis of right eye, unspecified acute conjunctivitis type - Plan: erythromycin ophthalmic ointment  She has failed conservative tx and  still has drainage and injection of R conjunctiva. Erythromycin ointment nightly.  Instructed to practice good hand hygiene and try not to touch face. Warm compresses and artificial tears also recommended to continue. F/u if no improvement in 7-10 days. Pt voiced understanding and agreement to the plan.  Jilda Roche [place], DO 06/06/22 4:23 PM

## 2022-06-06 NOTE — Patient Instructions (Signed)
Try not to touch your face.   Continue with the warm compresses and artificial tears.   Let us know if you need anything.

## 2022-06-13 ENCOUNTER — Ambulatory Visit (INDEPENDENT_AMBULATORY_CARE_PROVIDER_SITE_OTHER): Payer: Medicare Other

## 2022-06-13 DIAGNOSIS — I495 Sick sinus syndrome: Secondary | ICD-10-CM

## 2022-06-14 LAB — CUP PACEART REMOTE DEVICE CHECK
Battery Remaining Longevity: 105 mo
Battery Remaining Percentage: 85 %
Battery Voltage: 3.02 V
Brady Statistic AP VP Percent: 1 %
Brady Statistic AP VS Percent: 45 %
Brady Statistic AS VP Percent: 1 %
Brady Statistic AS VS Percent: 54 %
Brady Statistic RA Percent Paced: 45 %
Brady Statistic RV Percent Paced: 1 %
Date Time Interrogation Session: 20240611020016
Implantable Lead Connection Status: 753985
Implantable Lead Connection Status: 753985
Implantable Lead Implant Date: 20220912
Implantable Lead Implant Date: 20220912
Implantable Lead Location: 753859
Implantable Lead Location: 753860
Implantable Lead Model: 3830
Implantable Pulse Generator Implant Date: 20220912
Lead Channel Impedance Value: 400 Ohm
Lead Channel Impedance Value: 640 Ohm
Lead Channel Pacing Threshold Amplitude: 0.75 V
Lead Channel Pacing Threshold Amplitude: 1 V
Lead Channel Pacing Threshold Pulse Width: 0.4 ms
Lead Channel Pacing Threshold Pulse Width: 0.4 ms
Lead Channel Sensing Intrinsic Amplitude: 1.2 mV
Lead Channel Sensing Intrinsic Amplitude: 12 mV
Lead Channel Setting Pacing Amplitude: 2 V
Lead Channel Setting Pacing Amplitude: 2.5 V
Lead Channel Setting Pacing Pulse Width: 0.4 ms
Lead Channel Setting Sensing Sensitivity: 2 mV
Pulse Gen Model: 2272
Pulse Gen Serial Number: 3956585

## 2022-06-16 ENCOUNTER — Telehealth: Payer: Self-pay | Admitting: Internal Medicine

## 2022-06-16 NOTE — Telephone Encounter (Signed)
PDMP okay, Rx sent 

## 2022-06-16 NOTE — Telephone Encounter (Signed)
Requesting: Tylenol #3  Contract:  03/23/22  UDS: 03/23/22 Last Visit: 12/16/21 Next Visit: 07/24/22 Last Refill: 03/14/22 #30 and 0RF  Please Advise

## 2022-06-19 ENCOUNTER — Other Ambulatory Visit: Payer: Self-pay

## 2022-06-19 DIAGNOSIS — E041 Nontoxic single thyroid nodule: Secondary | ICD-10-CM

## 2022-06-20 ENCOUNTER — Other Ambulatory Visit: Payer: Self-pay

## 2022-06-20 ENCOUNTER — Telehealth (HOSPITAL_BASED_OUTPATIENT_CLINIC_OR_DEPARTMENT_OTHER): Payer: Self-pay

## 2022-06-20 DIAGNOSIS — Z13820 Encounter for screening for osteoporosis: Secondary | ICD-10-CM

## 2022-06-22 DIAGNOSIS — Z950 Presence of cardiac pacemaker: Secondary | ICD-10-CM | POA: Diagnosis not present

## 2022-06-22 DIAGNOSIS — M546 Pain in thoracic spine: Secondary | ICD-10-CM | POA: Diagnosis not present

## 2022-06-22 DIAGNOSIS — I890 Lymphedema, not elsewhere classified: Secondary | ICD-10-CM | POA: Diagnosis not present

## 2022-06-23 ENCOUNTER — Other Ambulatory Visit (HOSPITAL_COMMUNITY): Payer: Self-pay | Admitting: *Deleted

## 2022-06-23 MED ORDER — POTASSIUM CHLORIDE CRYS ER 20 MEQ PO TBCR: 20.0000 meq | EXTENDED_RELEASE_TABLET | Freq: Every day | ORAL | 6 refills | Status: DC

## 2022-06-27 ENCOUNTER — Ambulatory Visit (HOSPITAL_BASED_OUTPATIENT_CLINIC_OR_DEPARTMENT_OTHER)
Admission: RE | Admit: 2022-06-27 | Discharge: 2022-06-27 | Disposition: A | Discharge status: Home / Self Care | Payer: Medicare Other | Source: Ambulatory Visit | Attending: Internal Medicine | Admitting: Internal Medicine

## 2022-06-27 DIAGNOSIS — E041 Nontoxic single thyroid nodule: Secondary | ICD-10-CM | POA: Insufficient documentation

## 2022-06-27 DIAGNOSIS — E042 Nontoxic multinodular goiter: Secondary | ICD-10-CM | POA: Diagnosis not present

## 2022-06-29 ENCOUNTER — Ambulatory Visit
Admission: RE | Admit: 2022-06-29 | Discharge: 2022-06-29 | Disposition: A | Discharge status: Home / Self Care | Payer: Medicare Other | Source: Ambulatory Visit | Attending: Obstetrics & Gynecology | Admitting: Obstetrics & Gynecology

## 2022-06-29 ENCOUNTER — Other Ambulatory Visit (HOSPITAL_BASED_OUTPATIENT_CLINIC_OR_DEPARTMENT_OTHER): Payer: Medicare Other

## 2022-06-29 DIAGNOSIS — I495 Sick sinus syndrome: Secondary | ICD-10-CM | POA: Insufficient documentation

## 2022-06-29 DIAGNOSIS — Z780 Asymptomatic menopausal state: Secondary | ICD-10-CM | POA: Insufficient documentation

## 2022-06-29 DIAGNOSIS — I4892 Unspecified atrial flutter: Secondary | ICD-10-CM | POA: Diagnosis not present

## 2022-06-29 DIAGNOSIS — I2510 Atherosclerotic heart disease of native coronary artery without angina pectoris: Secondary | ICD-10-CM | POA: Insufficient documentation

## 2022-06-29 DIAGNOSIS — Z13820 Encounter for screening for osteoporosis: Secondary | ICD-10-CM | POA: Diagnosis not present

## 2022-06-29 DIAGNOSIS — M1712 Unilateral primary osteoarthritis, left knee: Secondary | ICD-10-CM | POA: Diagnosis not present

## 2022-06-29 DIAGNOSIS — R001 Bradycardia, unspecified: Secondary | ICD-10-CM | POA: Insufficient documentation

## 2022-06-29 DIAGNOSIS — E669 Obesity, unspecified: Secondary | ICD-10-CM | POA: Insufficient documentation

## 2022-06-29 DIAGNOSIS — I4891 Unspecified atrial fibrillation: Secondary | ICD-10-CM | POA: Insufficient documentation

## 2022-06-29 DIAGNOSIS — Z6839 Body mass index (BMI) 39.0-39.9, adult: Secondary | ICD-10-CM | POA: Insufficient documentation

## 2022-06-29 DIAGNOSIS — I4819 Other persistent atrial fibrillation: Secondary | ICD-10-CM | POA: Insufficient documentation

## 2022-06-29 DIAGNOSIS — M85851 Other specified disorders of bone density and structure, right thigh: Secondary | ICD-10-CM | POA: Diagnosis not present

## 2022-06-29 NOTE — Progress Notes (Signed)
  Electrophysiology Office Note:   ID:  Veronica Bradford, Veronica Bradford [birth year], MRN [id number]  Primary Cardiologist: Sherryl Manges, MD Electrophysiologist: Sherryl Manges, MD     History of Present Illness:   Veronica Bradford is a 77 y.o. 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   Had chest CT 05/2022 in setting of right back pain. Unremarkable apart from LAD calcifications. CT 08/2018 mentions "Thoracic aortic and coronary arterial vascular calcifications" though not further localized.   Since last being seen in our clinic the patient reports doing well overall. She has had some worse indigestion despite protonix BID, potentially in the setting of this stress. She has had some heaviness across the front of her chest, but not related to exertion, specifically noted mostly when she is at rest.   Review of systems complete and found to be negative unless listed in HPI.   EP information / Studies Reviewed:    EKG is ordered today. Personal review as below.  EKG Interpretation Date/Time:  Friday June 30 2022 11:55:39 EDT Ventricular Rate:  60 PR Interval:  266 QRS Duration:  166 QT Interval:  478 QTC Calculation: 478 R Axis:   -24  Text Interpretation: Atrial-paced rhythm with prolonged AV conduction Right bundle branch block Minimal voltage criteria for LVH, may be normal variant ( R in aVL ) Septal infarct , age undetermined When compared with ECG of 16-Jan-2022 15:22, Septal infarct is now Present No significant change was found Confirmed by Maxine Glenn [id number]) on 06/30/2022 12:49:53 PM    PPM Interrogation-  reviewed in detail today,  See PACEART report.  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      Physical Exam:   VS:  BP 104/72   Pulse 60   Ht 5\' 7"  (1.702 m)   Wt 253 lb 3.2 oz (114.9 kg)   SpO2 100%   BMI 39.66 kg/m    Wt Readings from Last 3  Encounters:  06/30/22 253 lb 3.2 oz (114.9 kg)  06/06/22 253 lb 6.4 oz (114.9 kg)  05/24/22 240 lb (108.9 kg)     GEN: Well nourished, well developed in no acute distress NECK: No JVD; No carotid bruits CARDIAC: Regular rate and rhythm, no murmurs, rubs, gallops RESPIRATORY:  Clear to auscultation without rales, wheezing or rhonchi  ABDOMEN: Soft, non-tender, non-distended EXTREMITIES:  No edema; No deformity   ASSESSMENT AND PLAN:    SND s/p Abbott PPM  Normal PPM function See Pace Art report No changes today  Persistent atrial fibrillation Atrial flutter Stable.  EKG today with NSR and stable intervals on tikosyn 250 mcg BID  Coronary artery calcifications Remarked upon on recent CT chest.  Noted previously with CT cardiac morphology 08/28/2017 with Ca score of 71% and calcium concentrated in LAD.  Echo 02/2021 65-70%, no DOE or exertional chest pain at this time. Consider updating if CAD is significant Will order coronary CT for more in depth measure of her LAD disease / CAD   Disposition:   Follow up with EP APP in 3 months, sooner with positive study.   Signed, Graciella Freer, PA-C

## 2022-06-30 ENCOUNTER — Ambulatory Visit: Payer: Medicare Other | Admitting: Student

## 2022-06-30 ENCOUNTER — Encounter: Payer: Self-pay | Admitting: Student

## 2022-06-30 VITALS — BP 104/72 | HR 60 | Ht 67.0 in | Wt 253.2 lb

## 2022-06-30 DIAGNOSIS — I2510 Atherosclerotic heart disease of native coronary artery without angina pectoris: Secondary | ICD-10-CM

## 2022-06-30 DIAGNOSIS — I495 Sick sinus syndrome: Secondary | ICD-10-CM | POA: Diagnosis not present

## 2022-06-30 DIAGNOSIS — I4892 Unspecified atrial flutter: Secondary | ICD-10-CM | POA: Diagnosis not present

## 2022-06-30 DIAGNOSIS — R001 Bradycardia, unspecified: Secondary | ICD-10-CM

## 2022-06-30 DIAGNOSIS — I4891 Unspecified atrial fibrillation: Secondary | ICD-10-CM | POA: Diagnosis not present

## 2022-06-30 MED ORDER — METOPROLOL TARTRATE 50 MG PO TABS: 50.0000 mg | ORAL_TABLET | Freq: Once | ORAL | 0 refills | Status: DC

## 2022-06-30 NOTE — Patient Instructions (Signed)
Medication Instructions:  Your physician recommends that you continue on your current medications as directed. Please refer to the Current Medication list given to you today.  *If you need a refill on your cardiac medications before your next appointment, please call your pharmacy*   Lab Work: BMET-TODAY If you have labs (blood work) drawn today and your tests are completely normal, you will receive your results only by: MyChart Message (if you have MyChart) OR A paper copy in the mail If you have any lab test that is abnormal or we need to change your treatment, we will call you to review the results.   Testing/Procedures:   Your cardiac CT will be scheduled at one of the below locations:   [facility] [address] [place], [place] [address] [phone]  OR  [facility] [address] [place], [place] [address] [phone]  OR   [facility] [address] [place], [place] [address] [phone]  If scheduled at [facility], please arrive at the [facility] (Entrance C2) of [facility] 30 minutes prior to test start time. You can use the FREE valet parking offered at entrance C (encouraged to control the heart rate for the test)  Proceed to the [facility] Radiology Department (first floor) to check-in and test prep.  All radiology patients and guests should use entrance C2 at [facility], accessed from [facility], even though the hospital's physical address listed is [address].    If scheduled at [facility] or [facility], please arrive 15 mins early for check-in and test prep.   Please follow these instructions carefully (unless otherwise directed):  An IV will be required for this test and Nitroglycerin will be given.  Hold all erectile dysfunction medications  at least 3 days (72 hrs) prior to test. (Ie viagra, cialis, sildenafil, tadalafil, etc)   On the Night Before the Test: Be sure to Drink plenty of water. Do not consume any caffeinated/decaffeinated beverages or chocolate 12 hours prior to your test. Do not take any antihistamines 12 hours prior to your test. If the patient has contrast allergy: Patient will need a prescription for Prednisone and very clear instructions (as follows): Prednisone 50 mg - take 13 hours prior to test Take another Prednisone 50 mg 7 hours prior to test Take another Prednisone 50 mg 1 hour prior to test Take Benadryl 50 mg 1 hour prior to test Patient must complete all four doses of above prophylactic medications. Patient will need a ride after test due to Benadryl.  On the Day of the Test: Drink plenty of water until 1 hour prior to the test. Do not eat any food 1 hour prior to test. You may take your regular medications prior to the test.  Take metoprolol (Lopressor) 50 mg two hours prior to test. If you take Furosemide/Hydrochlorothiazide/Spironolactone, please HOLD on the morning of the test. FEMALES- please wear underwire-free bra if available, avoid dresses & tight clothing        After the Test: Drink plenty of water. After receiving IV contrast, you may experience a mild flushed feeling. This is normal. On occasion, you may experience a mild rash up to 24 hours after the test. This is not dangerous. If this occurs, you can take Benadryl 25 mg and increase your fluid intake. If you experience trouble breathing, this can be serious. If  it is severe call 911 IMMEDIATELY. If it is mild, please call our office. If you take any of these medications: Glipizide/Metformin, Avandament, Glucavance, please do not take 48 hours after completing test unless otherwise instructed.  We will call to schedule your test 2-4 weeks out understanding that some insurance companies will need an authorization prior to the  service being performed.   For more information and frequently asked questions, please visit our website : [email or web address]  For non-scheduling related questions, please contact the cardiac imaging nurse navigator should you have any questions/concerns: Rockwell Alexandria, Cardiac Imaging Nurse Navigator Larey Brick, Cardiac Imaging Nurse Navigator [facility] Direct Office Dial: [phone]   For scheduling needs, including cancellations and rescheduling, please call [place], [phone].    Follow-Up: At [facility], you and your health needs are our priority.  As part of our continuing mission to provide you with exceptional heart care, we have created designated Provider Care Teams.  These Care Teams include your primary Cardiologist (physician) and Advanced Practice Providers (APPs -  Physician Assistants and Nurse Practitioners) who all work together to provide you with the care you need, when you need it.  Your next appointment:   3 month(s)  Provider:   Sherryl Manges, MD or [facility]" [place], [place]

## 2022-07-05 DIAGNOSIS — M1712 Unilateral primary osteoarthritis, left knee: Secondary | ICD-10-CM | POA: Diagnosis not present

## 2022-07-12 NOTE — Progress Notes (Signed)
Remote pacemaker transmission.   

## 2022-07-13 DIAGNOSIS — M1712 Unilateral primary osteoarthritis, left knee: Secondary | ICD-10-CM | POA: Diagnosis not present

## 2022-07-17 ENCOUNTER — Telehealth: Payer: Self-pay

## 2022-07-17 ENCOUNTER — Encounter: Payer: Self-pay | Admitting: Internal Medicine

## 2022-07-17 DIAGNOSIS — M546 Pain in thoracic spine: Secondary | ICD-10-CM | POA: Diagnosis not present

## 2022-07-17 DIAGNOSIS — S29012A Strain of muscle and tendon of back wall of thorax, initial encounter: Secondary | ICD-10-CM | POA: Diagnosis not present

## 2022-07-17 NOTE — Telephone Encounter (Signed)
Attempted to return call. No answer. LMTCB.   Merlin remote received. RA sensing trend appears stable, hx of AF. Presenting rhythm appears NSR w/ PAC/PVC's.

## 2022-07-17 NOTE — Telephone Encounter (Signed)
Transmission received. She states she also sent a my chart message to Dr. Graciela Husbands.

## 2022-07-17 NOTE — Telephone Encounter (Signed)
I spoke with the patient and she states she sent in a transmission. She been feeling extra beats then pacing beats. I let her know that we did not receive the transmission. She is going to go home around 1 pm. She is going to send the reading again. I told her once I see the transmission I will have the nurse to review it and give her a call back.

## 2022-07-17 NOTE — Telephone Encounter (Signed)
Pt returning call

## 2022-07-18 ENCOUNTER — Other Ambulatory Visit: Payer: Self-pay

## 2022-07-18 DIAGNOSIS — I493 Ventricular premature depolarization: Secondary | ICD-10-CM

## 2022-07-18 DIAGNOSIS — I480 Paroxysmal atrial fibrillation: Secondary | ICD-10-CM

## 2022-07-18 NOTE — Telephone Encounter (Signed)
Pt following up on calling back from yesterday. Please advise.

## 2022-07-18 NOTE — Telephone Encounter (Signed)
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

## 2022-07-19 ENCOUNTER — Ambulatory Visit: Payer: Medicare Other | Attending: Cardiology

## 2022-07-19 DIAGNOSIS — I493 Ventricular premature depolarization: Secondary | ICD-10-CM

## 2022-07-19 DIAGNOSIS — I4892 Unspecified atrial flutter: Secondary | ICD-10-CM | POA: Diagnosis not present

## 2022-07-19 DIAGNOSIS — I4891 Unspecified atrial fibrillation: Secondary | ICD-10-CM | POA: Diagnosis not present

## 2022-07-19 DIAGNOSIS — R001 Bradycardia, unspecified: Secondary | ICD-10-CM | POA: Diagnosis not present

## 2022-07-19 DIAGNOSIS — I480 Paroxysmal atrial fibrillation: Secondary | ICD-10-CM

## 2022-07-19 DIAGNOSIS — I495 Sick sinus syndrome: Secondary | ICD-10-CM | POA: Diagnosis not present

## 2022-07-20 ENCOUNTER — Telehealth: Payer: Self-pay

## 2022-07-20 ENCOUNTER — Other Ambulatory Visit: Payer: Medicare Other

## 2022-07-20 LAB — MAGNESIUM: Magnesium: 2.2 mg/dL (ref 1.6–2.3)

## 2022-07-20 NOTE — Telephone Encounter (Signed)
Left a detailed message for the pt to advise her that I spoke with the lab and we can do an add on test request on the blood that we already have from yesterday. She does not need to come in for a lab appt.

## 2022-07-20 NOTE — Telephone Encounter (Signed)
Patient reports she had blood work last night for magnesium and chemistry. Magnesium was resulted but not chemistry. Advised patient I will forward to triage to look into this further.

## 2022-07-20 NOTE — Telephone Encounter (Signed)
Pt would like a callback regarding whether lab sample for yesterday can be used for Chemistry lab in order to keep her from coming back to office. Please advise

## 2022-07-21 LAB — BASIC METABOLIC PANEL
BUN/Creatinine Ratio: 30 — ABNORMAL HIGH (ref 12–28)
BUN: 20 mg/dL (ref 8–27)
CO2: 20 mmol/L (ref 20–29)
Calcium: 11.1 mg/dL — ABNORMAL HIGH (ref 8.7–10.3)
Chloride: 103 mmol/L (ref 96–106)
Creatinine, Ser: 0.66 mg/dL (ref 0.57–1.00)
Glucose: 87 mg/dL (ref 70–99)
Potassium: 3.9 mmol/L (ref 3.5–5.2)
Sodium: 142 mmol/L (ref 134–144)
eGFR: 92 mL/min/{1.73_m2} (ref >59)

## 2022-07-21 LAB — SPECIMEN STATUS REPORT

## 2022-07-21 NOTE — Telephone Encounter (Signed)
Pt is scheduled for device check on 08/01/2022.

## 2022-07-24 ENCOUNTER — Ambulatory Visit (INDEPENDENT_AMBULATORY_CARE_PROVIDER_SITE_OTHER): Payer: Medicare Other | Admitting: *Deleted

## 2022-07-24 ENCOUNTER — Encounter: Payer: Self-pay | Admitting: Internal Medicine

## 2022-07-24 ENCOUNTER — Ambulatory Visit (INDEPENDENT_AMBULATORY_CARE_PROVIDER_SITE_OTHER): Payer: Medicare Other | Admitting: Internal Medicine

## 2022-07-24 VITALS — BP 118/64 | HR 61 | Temp 97.7°F | Resp 18 | Ht 67.0 in | Wt 255.0 lb

## 2022-07-24 VITALS — BP 118/64 | HR 61 | Ht 67.0 in | Wt 255.0 lb

## 2022-07-24 DIAGNOSIS — I10 Essential (primary) hypertension: Secondary | ICD-10-CM

## 2022-07-24 DIAGNOSIS — I480 Paroxysmal atrial fibrillation: Secondary | ICD-10-CM

## 2022-07-24 DIAGNOSIS — Z0000 Encounter for general adult medical examination without abnormal findings: Secondary | ICD-10-CM | POA: Diagnosis not present

## 2022-07-24 DIAGNOSIS — E559 Vitamin D deficiency, unspecified: Secondary | ICD-10-CM

## 2022-07-24 DIAGNOSIS — E210 Primary hyperparathyroidism: Secondary | ICD-10-CM

## 2022-07-24 LAB — TSH: TSH: 1.28 u[IU]/mL (ref 0.35–5.50)

## 2022-07-24 LAB — VITAMIN D 25 HYDROXY (VIT D DEFICIENCY, FRACTURES): VITD: 27.54 ng/mL — ABNORMAL LOW (ref 30.00–100.00)

## 2022-07-24 MED ORDER — VALSARTAN 40 MG PO TABS: 40.0000 mg | ORAL_TABLET | Freq: Every day | ORAL | 3 refills | Status: DC

## 2022-07-24 NOTE — Patient Instructions (Signed)
Veronica Bradford , Thank you for taking time to come for your Medicare Wellness Visit. I appreciate your ongoing commitment to your health goals. Please review the following plan we discussed and let me know if I can assist you in the future.     This is a list of the screening recommended for you and due dates:  Health Maintenance  Topic Date Due   COVID-19 Vaccine (9 - 2023-24 season) 01/24/2023*   Flu Shot  08/03/2022   Mammogram  01/26/2023   Medicare Annual Wellness Visit  07/24/2023   Colon Cancer Screening  03/11/2025   DTaP/Tdap/Td vaccine (3 - Td or Tdap) 03/24/2031   Pneumonia Vaccine  Completed   DEXA scan (bone density measurement)  Completed   Hepatitis C Screening  Completed   HPV Vaccine  Aged Out   Zoster (Shingles) Vaccine  Discontinued  *Topic was postponed. The date shown is not the original due date.    Next appointment: Follow up in one year for your annual wellness visit.   Preventive Care 63 Years and Older, Female Preventive care refers to lifestyle choices and visits with your health care provider that can promote health and wellness. What does preventive care include? A yearly physical exam. This is also called an annual well check. Dental exams once or twice a year. Routine eye exams. Ask your health care provider how often you should have your eyes checked. Personal lifestyle choices, including: Daily care of your teeth and gums. Regular physical activity. Eating a healthy diet. Avoiding tobacco and drug use. Limiting alcohol use. Practicing safe sex. Taking low-dose aspirin every day. Taking vitamin and mineral supplements as recommended by your health care provider. What happens during an annual well check? The services and screenings done by your health care provider during your annual well check will depend on your age, overall health, lifestyle risk factors, and family history of disease. Counseling  Your health care provider may ask you questions  about your: Alcohol use. Tobacco use. Drug use. Emotional well-being. Home and relationship well-being. Sexual activity. Eating habits. History of falls. Memory and ability to understand (cognition). Work and work Astronomer. Reproductive health. Screening  You may have the following tests or measurements: Height, weight, and BMI. Blood pressure. Lipid and cholesterol levels. These may be checked every 5 years, or more frequently if you are over 55 years old. Skin check. Lung cancer screening. You may have this screening every year starting at age 1 if you have a 30-pack-year history of smoking and currently smoke or have quit within the past 15 years. Fecal occult blood test (FOBT) of the stool. You may have this test every year starting at age 58. Flexible sigmoidoscopy or colonoscopy. You may have a sigmoidoscopy every 5 years or a colonoscopy every 10 years starting at age 106. Hepatitis C blood test. Hepatitis B blood test. Sexually transmitted disease (STD) testing. Diabetes screening. This is done by checking your blood sugar (glucose) after you have not eaten for a while (fasting). You may have this done every 1-3 years. Bone density scan. This is done to screen for osteoporosis. You may have this done starting at age 43. Mammogram. This may be done every 1-2 years. Talk to your health care provider about how often you should have regular mammograms. Talk with your health care provider about your test results, treatment options, and if necessary, the need for more tests. Vaccines  Your health care provider may recommend certain vaccines, such as: Influenza vaccine.  This is recommended every year. Tetanus, diphtheria, and acellular pertussis (Tdap, Td) vaccine. You may need a Td booster every 10 years. Zoster vaccine. You may need this after age 51. Pneumococcal 13-valent conjugate (PCV13) vaccine. One dose is recommended after age 51. Pneumococcal polysaccharide (PPSV23)  vaccine. One dose is recommended after age 32. Talk to your health care provider about which screenings and vaccines you need and how often you need them. This information is not intended to replace advice given to you by your health care provider. Make sure you discuss any questions you have with your health care provider. Document Released: 01/15/2015 Document Revised: 09/08/2015 Document Reviewed: 10/20/2014 Elsevier Interactive Patient Education  2017 [employer].  Fall Prevention in the Home Falls can cause injuries. They can happen to people of all ages. There are many things you can do to make your home safe and to help prevent falls. What can I do on the outside of my home? Regularly fix the edges of walkways and driveways and fix any cracks. Remove anything that might make you trip as you walk through a door, such as a raised step or threshold. Trim any bushes or trees on the path to your home. Use bright outdoor lighting. Clear any walking paths of anything that might make someone trip, such as rocks or tools. Regularly check to see if handrails are loose or broken. Make sure that both sides of any steps have handrails. Any raised decks and porches should have guardrails on the edges. Have any leaves, snow, or ice cleared regularly. Use sand or salt on walking paths during winter. Clean up any spills in your garage right away. This includes oil or grease spills. What can I do in the bathroom? Use night lights. Install grab bars by the toilet and in the tub and shower. Do not use towel bars as grab bars. Use non-skid mats or decals in the tub or shower. If you need to sit down in the shower, use a plastic, non-slip stool. Keep the floor dry. Clean up any water that spills on the floor as soon as it happens. Remove soap buildup in the tub or shower regularly. Attach bath mats securely with double-sided non-slip rug tape. Do not have throw rugs and other things on the floor that  can make you trip. What can I do in the bedroom? Use night lights. Make sure that you have a light by your bed that is easy to reach. Do not use any sheets or blankets that are too big for your bed. They should not hang down onto the floor. Have a firm chair that has side arms. You can use this for support while you get dressed. Do not have throw rugs and other things on the floor that can make you trip. What can I do in the kitchen? Clean up any spills right away. Avoid walking on wet floors. Keep items that you use a lot in easy-to-reach places. If you need to reach something above you, use a strong step stool that has a grab bar. Keep electrical cords out of the way. Do not use floor polish or wax that makes floors slippery. If you must use wax, use non-skid floor wax. Do not have throw rugs and other things on the floor that can make you trip. What can I do with my stairs? Do not leave any items on the stairs. Make sure that there are handrails on both sides of the stairs and use them. Fix handrails  that are broken or loose. Make sure that handrails are as long as the stairways. Check any carpeting to make sure that it is firmly attached to the stairs. Fix any carpet that is loose or worn. Avoid having throw rugs at the top or bottom of the stairs. If you do have throw rugs, attach them to the floor with carpet tape. Make sure that you have a light switch at the top of the stairs and the bottom of the stairs. If you do not have them, ask someone to add them for you. What else can I do to help prevent falls? Wear shoes that: Do not have high heels. Have rubber bottoms. Are comfortable and fit you well. Are closed at the toe. Do not wear sandals. If you use a stepladder: Make sure that it is fully opened. Do not climb a closed stepladder. Make sure that both sides of the stepladder are locked into place. Ask someone to hold it for you, if possible. Clearly mark and make sure that you  can see: Any grab bars or handrails. First and last steps. Where the edge of each step is. Use tools that help you move around (mobility aids) if they are needed. These include: Canes. Walkers. Scooters. Crutches. Turn on the lights when you go into a dark area. Replace any light bulbs as soon as they burn out. Set up your furniture so you have a clear path. Avoid moving your furniture around. If any of your floors are uneven, fix them. If there are any pets around you, be aware of where they are. Review your medicines with your doctor. Some medicines can make you feel dizzy. This can increase your chance of falling. Ask your doctor what other things that you can do to help prevent falls. This information is not intended to replace advice given to you by your health care provider. Make sure you discuss any questions you have with your health care provider. Document Released: 10/15/2008 Document Revised: 05/27/2015 Document Reviewed: 01/23/2014 Elsevier Interactive Patient Education  2017 [employer].

## 2022-07-24 NOTE — Progress Notes (Signed)
Subjective:   Veronica Bradford is a 77 y.o. female who presents for Medicare Annual (Subsequent) preventive examination.  Visit Complete: In person  Patient Medicare AWV questionnaire was completed by the patient on 07/19/22; I have confirmed that all information answered by patient is correct and no changes since this date.  Review of Systems     Cardiac Risk Factors include: advanced age (>41men, >89 women);dyslipidemia;hypertension;obesity (BMI >30kg/m2)     Objective:    Today's Vitals   07/24/22 1106  BP: 118/64  Pulse: 61  Weight: 255 lb (115.7 kg)  Height: 5\' 7"  (1.702 m)   Body mass index is 39.94 kg/m.     07/24/2022   11:05 AM 01/03/2022    4:39 PM 10/26/2021    8:13 AM 07/18/2021    3:50 PM 07/14/2021    9:18 PM 06/13/2021    1:08 PM 06/13/2021    1:07 PM  Advanced Directives  Does Patient Have a Medical Advance Directive? Yes Yes Yes Yes Yes Yes No  Type of Estate agent of [place];Living will Healthcare Power of [place];Living will  Healthcare Power of [place];Living will Healthcare Power of [place];Living will Healthcare Power of [place];Out of facility DNR (pink MOST or yellow form);Living will   Does patient want to make changes to medical advance directive? No - Patient declined No - Patient declined   No - Patient declined No - Patient declined   Copy of Healthcare Power of Attorney in Chart? No - copy requested No - copy requested    No - copy requested   Would patient like information on creating a medical advance directive?      No - Patient declined No - Patient declined    Current Medications (verified) Outpatient Encounter Medications as of 07/24/2022  Medication Sig   acetaminophen (TYLENOL) 500 MG tablet Take 500 mg by mouth every 6 (six) hours as needed for moderate pain or headache.   acetaminophen-codeine (TYLENOL #3) 300-30 MG tablet TAKE 1 TABLET BY MOUTH AT BEDTIME AS NEEDED FOR MODERATE PAIN   ARTIFICIAL TEAR SOLUTION OP  Place 1 drop into both eyes 4 (four) times daily as needed (dry/irritated eyes.).   cephALEXin (KEFLEX) 500 MG capsule Take 4 capsules (2,000 mg total) by mouth once as needed for up to 1 dose (30-60 minutes prior to dental procedures). (Patient not taking: Reported on 07/24/2022)   cholecalciferol (VITAMIN D3) 25 MCG (1000 UNIT) tablet Take 1,000 Units by mouth in the morning.   Chromium Picolinate 1000 MCG TABS Take 500 mcg by mouth daily after supper.   Coenzyme Q10 (CO Q 10) 100 MG CAPS Take 1 capsule by mouth daily.   colestipol (COLESTID) 1 g tablet Take 1 g by mouth 2 (two) times daily as needed.   CRANBERRY EXTRACT PO Take 2 tablets by mouth at bedtime.   Cyanocobalamin (VITAMIN B 12 PO) Take 1 tablet by mouth daily.   diltiazem (CARDIZEM) 30 MG tablet Take 1 tablet every 4 hours AS NEEDED for heart rate >100 as long as blood pressure >100. (Patient taking differently: Take 30 mg by mouth every 4 (four) hours. Take 1 tablet every 4 hours AS NEEDED for heart rate >100 as long as blood pressure >100. Take every 4 hours until next daily dose.)   dofetilide (TIKOSYN) 250 MCG capsule Take 1 capsule (250 mcg total) by mouth 2 (two) times daily.   estradiol (ESTRACE) 0.1 MG/GM vaginal cream Place 1 Applicatorful vaginally 3 (three) times a week.  Ferrous Sulfate (IRON PO) Take 1 tablet by mouth daily. For anemia   fluticasone (FLONASE) 50 MCG/ACT nasal spray Place 1 spray into both nostrils daily.   FOLIC ACID PO Take 800 mcg by mouth in the morning.   furosemide (LASIX) 40 MG tablet Take 1 tablet (40 mg total) by mouth as needed.   MAGNESIUM GLYCINATE PO Take 240 mg by mouth daily.   metoprolol tartrate (LOPRESSOR) 50 MG tablet Take 1 tablet (50 mg total) by mouth once for 1 dose. Take 2 hours prior to your CT. (Patient not taking: Reported on 07/24/2022)   Olopatadine HCl (PATADAY OP) Place 1 drop into both eyes daily as needed.   pantoprazole (PROTONIX) 40 MG tablet Take 1 tablet (40 mg total)  by mouth 2 (two) times daily.   potassium chloride SA (KLOR-CON M) 20 MEQ tablet Take 1 tablet (20 mEq total) by mouth daily.   pravastatin (PRAVACHOL) 40 MG tablet Take 1 tablet (40 mg total) by mouth at bedtime.   rivaroxaban (XARELTO) 20 MG TABS tablet Take 1 tablet (20 mg total) by mouth daily with supper.   spironolactone (ALDACTONE) 25 MG tablet TAKE 1 TABLET(25 MG) BY MOUTH DAILY   sucralfate (CARAFATE) 1 g tablet Take 1 g by mouth 2 (two) times daily.   valsartan (DIOVAN) 40 MG tablet Take 1 tablet (40 mg total) by mouth daily.   [DISCONTINUED] erythromycin ophthalmic ointment Place 1 Application into the right eye at bedtime. (Patient not taking: Reported on 07/24/2022)   [DISCONTINUED] magnesium gluconate (MAGONATE) 500 MG tablet Take 500 mg by mouth 2 (two) times daily. (Patient not taking: Reported on 07/24/2022)   [DISCONTINUED] Omega-3 Fatty Acids (FISH OIL PO) Take 1 tablet by mouth every evening. (Patient not taking: Reported on 07/24/2022)   [DISCONTINUED] valsartan (DIOVAN) 160 MG tablet Take 0.5 tablets (80 mg total) by mouth daily.   No facility-administered encounter medications on file as of 07/24/2022.    Allergies (verified) Ibuprofen, Lactose intolerance (gi), Lisinopril, Nsaids, Tape, Tolmetin, Zoster vaccine live, Amoxicillin, and Penicillins   History: Past Medical History:  Diagnosis Date   Atrial fibrillation (HCC)    Atrial fibrillation (HCC)    Back pain    Chronic sinusitis    s/p 2 surgeries remotely, Dr Ezzard Standing   Closed fracture of metatarsal of left foot    L foot, fifth metatarsal   Ectopic pregnancy    Endometrial cancer (HCC) 2004   Early diagnosis   GERD (gastroesophageal reflux disease)    Hyperlipemia    Hypertension    IBS (irritable bowel syndrome) 08/13/2014   Dx 2015, diarrhea on-off, Dr Matthias Hughs    Lumbar radiculopathy 08/2012   MRI in 08/2012   Mild hyperparathyroidism (HCC)    Osteopenia    PAF (paroxysmal atrial fibrillation) (HCC)     Prediabetes    Sciatica of right side 08/2013   Received Depomedrol 80 mg injection   Sleep apnea 09/09/2013   Dx with OSA in 2015, by Dr. Graciela Husbands, APAP   Past Surgical History:  Procedure Laterality Date   ATRIAL FIBRILLATION ABLATION N/A 09/04/2017   Procedure: ATRIAL FIBRILLATION ABLATION;  Surgeon: Hillis Range, MD;  Location: [facility];  Service: Cardiovascular;  Laterality: N/A;   BIOPSY  03/11/2020   Procedure: BIOPSY;  Surgeon: Bernette Redbird, MD;  Location: [facility];  Service: Endoscopy;;  EGD and COLON   CARPAL TUNNEL RELEASE Right 06/17/2019   CARPAL TUNNEL RELEASE Left 11/2020   CHOLECYSTECTOMY  ~2006  COLONOSCOPY WITH PROPOFOL N/A 03/11/2020   Procedure: COLONOSCOPY WITH PROPOFOL;  Surgeon: Bernette Redbird, MD;  Location: [facility];  Service: Endoscopy;  Laterality: N/A;   DILATION AND CURETTAGE OF UTERUS     after SAB   ESOPHAGOGASTRODUODENOSCOPY  02/09/2014   Dr. Matthias Hughs   ESOPHAGOGASTRODUODENOSCOPY (EGD) WITH PROPOFOL N/A 03/11/2020   Procedure: ESOPHAGOGASTRODUODENOSCOPY (EGD) WITH PROPOFOL;  Surgeon: Bernette Redbird, MD;  Location: [facility];  Service: Endoscopy;  Laterality: N/A;   FINGER SURGERY Left    index   NASAL SINUS SURGERY     x 2 remotely, Dr Ezzard Standing   PACEMAKER IMPLANT N/A 09/13/2020   Procedure: PACEMAKER IMPLANT;  Surgeon: Duke Salvia, MD;  Location: [facility] INVASIVE CV LAB;  Service: Cardiovascular;  Laterality: N/A;   TONSILLECTOMY     TOTAL ABDOMINAL HYSTERECTOMY W/ BILATERAL SALPINGOOPHORECTOMY     TOTAL HIP ARTHROPLASTY Left 07/18/2021   Procedure: TOTAL HIP ARTHROPLASTY ANTERIOR APPROACH;  Surgeon: Samson Frederic, MD;  Location: [facility];  Service: Orthopedics;  Laterality: Left;   TOTAL KNEE ARTHROPLASTY Right ~2008   Family History  Problem Relation Age of Onset   Hypertension Mother    Colon cancer Mother    Breast cancer Mother    Ovarian cancer Mother    Hypertension Father    Diabetes Father    Head & neck  cancer Sister    CAD Neg Hx    Social History   Socioeconomic History   Marital status: Married    Spouse name: Not on file   Number of children: 1   Years of education: Not on file   Highest education level: Some college, no degree  Occupational History   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  Tobacco Use   Smoking status: Former    Current packs/day: 0.00    Types: Cigarettes    Quit date: 01/06/1980    Years since quitting: 42.5   Smokeless tobacco: Never   Tobacco comments:    smoked from 1970 to 1982, less than 1 ppd  Vaping Use   Vaping status: Never Used  Substance and Sexual Activity   Alcohol use: Yes    Comment: rare   Drug use: No   Sexual activity: Not Currently    Birth control/protection: None  Other Topics Concern   Not on file  Social History Narrative   Lives w/ husband, and son Trinna Post   Retired Engineer, civil (consulting)   Lives in [place]   Social Determinants of Health   Financial Resource Strain: Low Risk  (07/19/2022)   Overall Financial Resource Strain (CARDIA)    Difficulty of Paying Living Expenses: Not hard at all  Food Insecurity: No Food Insecurity (07/19/2022)   Hunger Vital Sign    Worried About Running Out of Food in the Last Year: Never true    Ran Out of Food in the Last Year: Never true  Transportation Needs: No Transportation Needs (07/19/2022)   PRAPARE - Administrator, Civil Service (Medical): No    Lack of Transportation (Non-Medical): No  Physical Activity: Insufficiently Active (07/19/2022)   Exercise Vital Sign    Days of Exercise per Week: 4 days    Minutes of Exercise per Session: 20 min  Stress: No Stress Concern Present (07/19/2022)   [employer] of Occupational Health - Occupational Stress Questionnaire    Feeling of Stress : Only a little  Social Connections: Moderately Integrated (07/19/2022)   Social Connection and Isolation Panel [NHANES]    Frequency of Communication with Friends  and Family: More than three times a week     Frequency of Social Gatherings with Friends and Family: More than three times a week    Attends Religious Services: Never    Database administrator or Organizations: No    Attends Engineer, structural: 1 to 4 times per year    Marital Status: Married  Recent Concern: Social Connections - Moderately Isolated (06/05/2022)   Social Connection and Isolation Panel [NHANES]    Frequency of Communication with Friends and Family: More than three times a week    Frequency of Social Gatherings with Friends and Family: More than three times a week    Attends Religious Services: Never    Database administrator or Organizations: No    Attends Engineer, structural: Not on file    Marital Status: Married    Tobacco Counseling Counseling given: Not Answered Tobacco comments: smoked from 1970 to 1982, less than 1 ppd   Clinical Intake:  Pre-visit preparation completed: Yes  Pain : No/denies pain  BMI - recorded: 39.94 Nutritional Status: BMI > 30  Obese Diabetes: No  How often do you need to have someone help you when you read instructions, pamphlets, or other written materials from your doctor or pharmacy?: 1 - Never  Interpreter Needed?: No  Information entered by :: Donne Anon, CMA   Activities of Daily Living    07/19/2022    2:38 PM 01/03/2022    4:39 PM  In your present state of health, do you have any difficulty performing the following activities:  Hearing? 0 0  Vision? 0 0  Difficulty concentrating or making decisions? 0 0  Walking or climbing stairs? 0 0  Dressing or bathing? 0 0  Doing errands, shopping? 0 1  Preparing Food and eating ? N   Using the Toilet? N   In the past six months, have you accidently leaked urine? Y   Do you have problems with loss of bowel control? N   Managing your Medications? N   Managing your Finances? N   Housekeeping or managing your Housekeeping? N     Patient Care Team: Wanda Plump, MD as PCP - General (Internal  Medicine) Duke Salvia, MD as PCP - Cardiology (Cardiology) Duke Salvia, MD as PCP - Electrophysiology (Cardiology) Duke Salvia, MD as Consulting Physician (Cardiology) Bernette Redbird, MD as Consulting Physician (Gastroenterology) [place], Heather, OD as Consulting Physician (Optometry) Stephannie Li, MD as Consulting Physician (Ophthalmology) Dominica Severin, MD as Consulting Physician (Orthopedic Surgery) Darnell Level, MD as Consulting Physician (General Surgery)  Indicate any recent Medical Services you may have received from other than [facility] providers in the past year (date may be approximate).     Assessment:   This is a routine wellness examination for Veronica Bradford.  Hearing/Vision screen No results found.  Dietary issues and exercise activities discussed:     Goals Addressed   None    Depression Screen    07/24/2022   10:19 AM 03/23/2022    9:10 AM 09/16/2021    4:00 PM 06/13/2021    1:09 PM 03/23/2021   10:35 AM 11/30/2020    8:20 AM 11/05/2020    2:47 PM  PHQ 2/9 Scores  PHQ - 2 Score 0 0 0 0 0 0 0    Fall Risk    07/24/2022   10:19 AM 07/19/2022    2:38 PM 03/23/2022    8:22 AM 09/16/2021    4:00  PM 06/13/2021    1:08 PM  Fall Risk   Falls in the past year? 0 0 1 1 0  Number falls in past yr: 0  0 0 0  Injury with Fall? 0  1 1 0  Risk for fall due to :   No Fall Risks History of fall(s);Impaired balance/gait No Fall Risks  Follow up Falls evaluation completed  Falls evaluation completed Falls evaluation completed;Falls prevention discussed Falls evaluation completed    MEDICARE RISK AT HOME:   TIMED UP AND GO:  Was the test performed?  Yes  Length of time to ambulate 10 feet: 7 sec Gait steady and fast without use of assistive device    Cognitive Function:        07/24/2022   11:20 AM 06/13/2021    1:14 PM  6CIT Screen  What Year? 0 points 0 points  What month? 0 points 0 points  What time? 0 points 0 points  Count back from 20 0 points 0  points  Months in reverse 0 points 0 points  Repeat phrase 0 points 2 points  Total Score 0 points 2 points    Immunizations Immunization History  Administered Date(s) Administered   Fluad Quad(high Dose 65+) 09/19/2018   Influenza Split 10/20/2020, 10/17/2021   Influenza, High Dose Seasonal PF 10/01/2014, 10/19/2015, 11/10/2016, 10/03/2017   Influenza-Unspecified 10/19/2010, 09/09/2013, 09/25/2019   PFIZER(Purple Top)SARS-COV-2 Vaccination 02/09/2019, 03/06/2019, 10/08/2019, 04/12/2020   PNEUMOCOCCAL CONJUGATE-20 03/23/2021   Pfizer Covid-19 Vaccine Bivalent Booster 25yrs & up 09/20/2020, 05/23/2021, 10/12/2021, 03/20/2022   Pneumococcal Conjugate-13 03/07/2013   Pneumococcal Polysaccharide-23 09/09/2013   RSV,unspecified 12/13/2021   Tdap 07/26/2011, 03/23/2021   Zoster, Live 11/12/2012    TDAP status: Up to date  Flu Vaccine status: Up to date  Pneumococcal vaccine status: Up to date  Covid-19 vaccine status: Information provided on how to obtain vaccines.   Qualifies for Shingles Vaccine? Yes   Zostavax completed Yes   Shingrix Completed?: No.    Education has been provided regarding the importance of this vaccine. Patient has been advised to call insurance company to determine out of pocket expense if they have not yet received this vaccine. Advised may also receive vaccine at local pharmacy or Health Dept. Verbalized acceptance and understanding.  Screening Tests Health Maintenance  Topic Date Due   Medicare Annual Wellness (AWV)  06/14/2022   COVID-19 Vaccine (9 - 2023-24 season) 01/24/2023 (Originally 05/15/2022)   INFLUENZA VACCINE  08/03/2022   MAMMOGRAM  01/26/2023   Colonoscopy  03/11/2025   DTaP/Tdap/Td (3 - Td or Tdap) 03/24/2031   Pneumonia Vaccine 18+ Years old  Completed   DEXA SCAN  Completed   Hepatitis C Screening  Completed   HPV VACCINES  Aged Out   Zoster Vaccines- Shingrix  Discontinued    Health Maintenance  Health Maintenance Due  Topic  Date Due   Medicare Annual Wellness (AWV)  06/14/2022    Colorectal cancer screening: Type of screening: Colonoscopy. Completed 03/11/20. Repeat every 5 years  Mammogram status: Completed 01/25/22. Repeat every year  Bone Density status: Completed 06/29/22. Results reflect: Bone density results: OSTEOPENIA. Repeat every 2 years.  Lung Cancer Screening: (Low Dose CT Chest recommended if Age 42-80 years, 20 pack-year currently smoking OR have quit w/in 15years.) does not qualify.    Additional Screening:  Hepatitis C Screening: does qualify; Completed 05/02/16  Vision Screening: Recommended annual ophthalmology exams for early detection of glaucoma and other disorders of the eye. Is the patient up  to date with their annual eye exam?  Yes  Who is the provider or what is the name of the office in which the patient attends annual eye exams? Dr. Heather [place]i If pt is not established with a provider, would they like to be referred to a provider to establish care? No .   Dental Screening: Recommended annual dental exams for proper oral hygiene  Diabetic Foot Exam: N/a  Community Resource Referral / Chronic Care Management: CRR required this visit?  No   CCM required this visit?  No     Plan:     I have personally reviewed and noted the following in the patient's chart:   Medical and social history Use of alcohol, tobacco or illicit drugs  Current medications and supplements including opioid prescriptions. Patient is not currently taking opioid prescriptions. Functional ability and status Nutritional status Physical activity Advanced directives List of other physicians Hospitalizations, surgeries, and ER visits in previous 12 months Vitals Screenings to include cognitive, depression, and falls Referrals and appointments  In addition, I have reviewed and discussed with patient certain preventive protocols, quality metrics, and best practice recommendations. A written personalized  care plan for preventive services as well as general preventive health recommendations were provided to patient.     Donne Anon, CMA   07/24/2022   After Visit Summary: Sent to mychart  Nurse Notes: None

## 2022-07-24 NOTE — Progress Notes (Unsigned)
Subjective:    Patient ID: Veronica Bradford, female    DOB: 28-Nov-1945, 77 y.o.   MRN: [id number]  DOS:  07/24/2022 Type of visit - description: Follow-up Study multiple issues discussed. Other than feeling fatigue, she feels okay.    Review of Systems See above   Past Medical History:  Diagnosis Date   Atrial fibrillation (HCC)    Atrial fibrillation (HCC)    Back pain    Chronic sinusitis    s/p 2 surgeries remotely, Dr Ezzard Standing   Closed fracture of metatarsal of left foot    L foot, fifth metatarsal   Ectopic pregnancy    Endometrial cancer (HCC) 2004   Early diagnosis   GERD (gastroesophageal reflux disease)    Hyperlipemia    Hypertension    IBS (irritable bowel syndrome) 08/13/2014   Dx 2015, diarrhea on-off, Dr Matthias Hughs    Lumbar radiculopathy 08/2012   MRI in 08/2012   Mild hyperparathyroidism (HCC)    Osteopenia    PAF (paroxysmal atrial fibrillation) (HCC)    Prediabetes    Sciatica of right side 08/2013   Received Depomedrol 80 mg injection   Sleep apnea 09/09/2013   Dx with OSA in 2015, by Dr. Graciela Husbands, APAP    Past Surgical History:  Procedure Laterality Date   ATRIAL FIBRILLATION ABLATION N/A 09/04/2017   Procedure: ATRIAL FIBRILLATION ABLATION;  Surgeon: Hillis Range, MD;  Location: [facility];  Service: Cardiovascular;  Laterality: N/A;   BIOPSY  03/11/2020   Procedure: BIOPSY;  Surgeon: Bernette Redbird, MD;  Location: [facility];  Service: Endoscopy;;  EGD and COLON   CARPAL TUNNEL RELEASE Right 06/17/2019   CARPAL TUNNEL RELEASE Left 11/2020   CHOLECYSTECTOMY  ~2006   COLONOSCOPY WITH PROPOFOL N/A 03/11/2020   Procedure: COLONOSCOPY WITH PROPOFOL;  Surgeon: Bernette Redbird, MD;  Location: [facility];  Service: Endoscopy;  Laterality: N/A;   DILATION AND CURETTAGE OF UTERUS     after SAB   ESOPHAGOGASTRODUODENOSCOPY  02/09/2014   Dr. Matthias Hughs   ESOPHAGOGASTRODUODENOSCOPY (EGD) WITH PROPOFOL N/A 03/11/2020   Procedure:  ESOPHAGOGASTRODUODENOSCOPY (EGD) WITH PROPOFOL;  Surgeon: Bernette Redbird, MD;  Location: [facility];  Service: Endoscopy;  Laterality: N/A;   FINGER SURGERY Left    index   NASAL SINUS SURGERY     x 2 remotely, Dr Ezzard Standing   PACEMAKER IMPLANT N/A 09/13/2020   Procedure: PACEMAKER IMPLANT;  Surgeon: Duke Salvia, MD;  Location: [facility] INVASIVE CV LAB;  Service: Cardiovascular;  Laterality: N/A;   TONSILLECTOMY     TOTAL ABDOMINAL HYSTERECTOMY W/ BILATERAL SALPINGOOPHORECTOMY     TOTAL HIP ARTHROPLASTY Left 07/18/2021   Procedure: TOTAL HIP ARTHROPLASTY ANTERIOR APPROACH;  Surgeon: Samson Frederic, MD;  Location: [facility] OR[facility];  Service: Orthopedics;  Laterality: Left;   TOTAL KNEE ARTHROPLASTY Right ~2008    Current Outpatient Medications  Medication Instructions   acetaminophen (TYLENOL) 500 mg, Oral, Every 6 hours PRN   acetaminophen-codeine (TYLENOL #3) 300-30 MG tablet 1 tablet, Oral, At bedtime PRN   ARTIFICIAL TEAR SOLUTION OP 1 drop, Both Eyes, 4 times daily PRN   cephALEXin (KEFLEX) 2,000 mg, Oral, Once PRN   cholecalciferol (VITAMIN D3) 1,000 Units, Oral, Every morning   Chromium Picolinate 500 mcg, Oral, Daily after supper   Coenzyme Q10 (CO Q 10) 100 MG CAPS 1 capsule, Oral, Daily   colestipol (COLESTID) 1 g, Oral, 2 times daily PRN   CRANBERRY EXTRACT PO 2 tablets, Oral, Daily at bedtime   Cyanocobalamin (VITAMIN B  12 PO) 1 tablet, Oral, Daily   diltiazem (CARDIZEM) 30 MG tablet Take 1 tablet every 4 hours AS NEEDED for heart rate >100 as long as blood pressure >100.   dofetilide (TIKOSYN) 250 mcg, Oral, 2 times daily   erythromycin ophthalmic ointment 1 Application, Right Eye, Daily at bedtime   estradiol (ESTRACE) 0.1 MG/GM vaginal cream 1 Applicatorful, Vaginal, 3 times weekly   Ferrous Sulfate (IRON PO) 1 tablet, Oral, Daily, For anemia   fluticasone (FLONASE) 50 MCG/ACT nasal spray 1 spray, Each Nare, Daily   FOLIC ACID PO 161 mcg, Oral, Every morning   furosemide (LASIX) 40  mg, Oral, As needed   magnesium gluconate (MAGONATE) 500 mg, 2 times daily   MAGNESIUM GLYCINATE PO 240 mg, Oral, Daily   metoprolol tartrate (LOPRESSOR) 50 mg, Oral,  Once, Take 2 hours prior to your CT.   Olopatadine HCl (PATADAY OP) 1 drop, Both Eyes, Daily PRN   Omega-3 Fatty Acids (FISH OIL PO) 1 tablet, Every evening   pantoprazole (PROTONIX) 40 mg, Oral, 2 times daily   potassium chloride SA (KLOR-CON M) 20 MEQ tablet 20 mEq, Oral, Daily   pravastatin (PRAVACHOL) 40 mg, Oral, Daily at bedtime   rivaroxaban (XARELTO) 20 mg, Oral, Daily with supper   spironolactone (ALDACTONE) 25 MG tablet TAKE 1 TABLET(25 MG) BY MOUTH DAILY   sucralfate (CARAFATE) 1 g, Oral, 2 times daily   valsartan (DIOVAN) 80 mg, Oral, Daily       Objective:   Physical Exam BP 118/64   Pulse 61   Temp 97.7 F (36.5 C) (Oral)   Resp 18   Ht 5\' 7"  (1.702 m)   Wt 255 lb (115.7 kg)   SpO2 96%   BMI 39.94 kg/m  General:   Well developed, NAD, BMI noted. HEENT:  Normocephalic . Face symmetric, atraumatic Lungs:  CTA B Normal respiratory effort, no intercostal retractions, no accessory muscle use. Heart: RRR,  no murmur.  Lower extremities: Trace pretibial edema bilaterally  Skin: Not pale. Not jaundice Neurologic:  alert & oriented X3.  Speech normal, gait appropriate for age and unassisted Psych--  Cognition and judgment appear intact.  Cooperative with normal attention span and concentration.  Behavior appropriate. No anxious or depressed appearing.      Assessment    Assessment: Prediabetes HTN Hyperlipidemia Chronic lower extremity edema L>R (eval by previous pcp, related to varicose veins?) CV:  -- P. Atrial fibrillation, A. flutter: on xarelto flecainide-diltiazem  prn --Ablation 09/2017 --Pacemaker 09-2020 GI: Dr Matthias Hughs  --GERD (zantac or protonix prn), IBS, chronic diarrhea --GI records: Colonoscopies 1998, 2004, 2009 8 and 07-2013. Negative for polyps or IBD. +  Diverticuli. EGD/cscope 11/2016; EGD/Cscope 03/11/2020   --Increased LFTs: CT abdomen 05/2016: Normal liver. 05/2017: WNL  Hepatitis B and C, Alpha 1 antitrypsin, anti-smooth muscle antibody, ANA trans-ferritin  OSA--- CPAP, Dr. Earl Gala Osteopenia:  Multiple  Dexas, last few:  T score 2009  -1.1; T score 2015  -0.7; T score 05/2017 -1.8 DJD --Chronic back pain-- tylenol #3 prn --- RF per PCP --history of epidurals in the past, MRI 2014: Scoliosis,DJD --CIPRO pre dental d/t knee replacement (rx by ortho) H/o endometrial cancer, found during a hysterectomy, no chemotherapy or XRT.  Primary hyperparathyroidism: Chronic hypercalcemia, increased PTH 05/2016, refered to ENT  PLAN:  HTN: Ambulatory BPs have being as low as 80/50.  Self decrease valsartan to 40 mg once daily now, she feels better in the mornings and ambulatory BPs in the 120s. Good compliance  with the spironolactone, potassium supplements however she takes Lasix only twice a week.  Last BMP okay.  Recommend not to stop Lasix completely as her potassium balance is delicate. Pulmonary nodules: Dr. Elesa Massed ordered a CT, is show pulmonary nodes require no follow-up. Atrial fibrillation: Saw cardiology 06/30/2022. Felt to be stable. Due to coronary calcifications the Rx coronary CTA which is pending.  Primary hyperparathyroidism: Last visit with surgery 04-2019, they did not feel surgery was indicated and recommended to monitor her PTH levels and vitamin D.  Patient is concerned and would like to see Dr. Gerrit Friends again.  Recent ultrasound of the thyroid 06/2022 was stable. Plan: Referral to surgery, PTH, vitamin D (currently on 1000 units daily), TSH. Fatigue: Observation for now.  Checking labs. R thoracic and RUQ pain: Was seen elsewhere, patient report she had a MRI, did formal physical therapy which did not help, eventually did self PT and is now essentially pain-free. Chronic LE edema: Well-controlled RTC 6 months

## 2022-07-24 NOTE — Patient Instructions (Addendum)
Vaccines I recommend: Flu shot this fall  Check the  blood pressure regularly BP GOAL is between 110/65 and  135/85. If it is consistently higher or lower, let me know    GO TO THE LAB : Get the blood work     GO TO THE FRONT DESK, PLEASE SCHEDULE YOUR APPOINTMENTS Come back for   checkup in 6 months

## 2022-07-25 ENCOUNTER — Telehealth (HOSPITAL_COMMUNITY): Payer: Self-pay | Admitting: *Deleted

## 2022-07-25 NOTE — Telephone Encounter (Signed)
Reaching out to patient to offer assistance regarding upcoming cardiac imaging study; pt verbalizes understanding of appt date/time, parking situation and where to check in, pre-test NPO status and medications ordered, and verified current allergies; name and call back number provided for further questions should they arise  Veronica Brick RN Navigator Cardiac Imaging [facility] [phone] office [phone] cell  Patient to take 25mg  metoprolol tartrate two hours prior to her cardiac CT scan  if her HR is greater than 65bpm.

## 2022-07-25 NOTE — Assessment & Plan Note (Signed)
HTN: Ambulatory BPs have being as low as 80/50 in AM.  Self decrease valsartan to 40 mg every day, feels better in the mornings and ambulatory BPs in the 120s. Good compliance with the spironolactone, potassium supplements however she takes Lasix only twice a week.  Last BMP okay.  Recommend not to stop Lasix completely as her potassium balance is delicate. Pulmonary nodules: Dr. Elesa Massed ordered a CT, is show pulmonary nodes require no follow-up. Atrial fibrillation:  Saw cardiology 06/30/2022. Felt to be stable. B/c finding of coronary calcifications they Rx a coronary CTA which is pending. Primary hyperparathyroidism: Last visit with surgery 04-2019, they did not feel surgery was indicated and recommended to monitor her PTH levels and vitamin D.  Patient is concerned and would like to see Dr. Gerrit Friends again.  Recent ultrasound of the thyroid 06/2022 was stable. Plan: Referral to surgery,check PTH, vitamin D (currently on 1000 units daily), TSH. Fatigue: Observation for now.  Checking labs. R thoracic and RUQ pain: Was seen elsewhere, patient report she had a MRI, did formal physical therapy which did not help, eventually did self PT and is now essentially pain-free. Chronic LE edema: Well-controlled RTC 6 months

## 2022-07-26 ENCOUNTER — Ambulatory Visit (HOSPITAL_COMMUNITY)
Admission: RE | Admit: 2022-07-26 | Discharge: 2022-07-26 | Disposition: A | Discharge status: Home / Self Care | Payer: Medicare Other | Source: Ambulatory Visit | Attending: Student | Admitting: Student

## 2022-07-26 DIAGNOSIS — I2510 Atherosclerotic heart disease of native coronary artery without angina pectoris: Secondary | ICD-10-CM

## 2022-07-26 LAB — PTH, INTACT AND CALCIUM
Calcium: 10.5 mg/dL — ABNORMAL HIGH (ref 8.6–10.4)
PTH: 192 pg/mL — ABNORMAL HIGH (ref 16–77)

## 2022-07-26 MED ORDER — NITROGLYCERIN 0.4 MG SL SUBL
0.8000 mg | SUBLINGUAL_TABLET | Freq: Once | SUBLINGUAL | Status: AC
  Administered 2022-07-26: 0.8 mg via SUBLINGUAL

## 2022-07-26 MED ORDER — NITROGLYCERIN 0.4 MG SL SUBL
SUBLINGUAL_TABLET | SUBLINGUAL | Status: AC
  Filled 2022-07-26: qty 2

## 2022-07-26 MED ORDER — IOHEXOL 350 MG/ML SOLN
95.0000 mL | Freq: Once | INTRAVENOUS | Status: AC | PRN
  Administered 2022-07-26: 95 mL via INTRAVENOUS

## 2022-07-31 ENCOUNTER — Other Ambulatory Visit (HOSPITAL_COMMUNITY): Payer: Self-pay

## 2022-07-31 MED ORDER — DOFETILIDE 250 MCG PO CAPS: 250.0000 ug | ORAL_CAPSULE | Freq: Two times a day (BID) | ORAL | 1 refills | Status: DC

## 2022-07-31 MED ORDER — VITAMIN D (ERGOCALCIFEROL) 1.25 MG (50000 UNIT) PO CAPS: 50000.0000 [IU] | ORAL_CAPSULE | ORAL | 0 refills | Status: AC

## 2022-07-31 NOTE — Addendum Note (Signed)
Addended byConrad Sweetwater D on: 07/31/2022 07:41 AM   Modules accepted: Orders

## 2022-08-01 ENCOUNTER — Ambulatory Visit: Payer: Medicare Other | Attending: Cardiology

## 2022-08-01 DIAGNOSIS — R001 Bradycardia, unspecified: Secondary | ICD-10-CM

## 2022-08-01 LAB — CUP PACEART INCLINIC DEVICE CHECK
Battery Remaining Longevity: 111 mo
Battery Voltage: 3.02 V
Brady Statistic RA Percent Paced: 45 %
Brady Statistic RV Percent Paced: 2.1 %
Date Time Interrogation Session: 20240730154855
Implantable Lead Connection Status: 753985
Implantable Lead Connection Status: 753985
Implantable Lead Implant Date: 20220912
Implantable Lead Implant Date: 20220912
Implantable Lead Location: 753859
Implantable Lead Location: 753860
Implantable Lead Model: 3830
Implantable Pulse Generator Implant Date: 20220912
Lead Channel Impedance Value: 387.5 Ohm
Lead Channel Impedance Value: 612.5 Ohm
Lead Channel Sensing Intrinsic Amplitude: 1.1 mV
Lead Channel Sensing Intrinsic Amplitude: 12 mV
Lead Channel Setting Pacing Amplitude: 2 V
Lead Channel Setting Pacing Amplitude: 2.5 V
Lead Channel Setting Pacing Pulse Width: 0.4 ms
Lead Channel Setting Sensing Sensitivity: 2 mV
Pulse Gen Model: 2272
Pulse Gen Serial Number: 3956585

## 2022-08-01 NOTE — Progress Notes (Signed)
Patient seen in device clinic. See report for changes made by Darrol Angel. Jude rep.

## 2022-08-07 ENCOUNTER — Other Ambulatory Visit (HOSPITAL_COMMUNITY): Payer: Self-pay | Admitting: *Deleted

## 2022-08-07 DIAGNOSIS — I480 Paroxysmal atrial fibrillation: Secondary | ICD-10-CM

## 2022-08-07 MED ORDER — RIVAROXABAN 20 MG PO TABS
20.0000 mg | ORAL_TABLET | Freq: Every day | ORAL | 2 refills | Status: DC
Start: 2022-08-07 — End: 2023-03-20

## 2022-08-22 ENCOUNTER — Telehealth: Payer: Self-pay | Admitting: *Deleted

## 2022-08-22 DIAGNOSIS — E210 Primary hyperparathyroidism: Secondary | ICD-10-CM | POA: Diagnosis not present

## 2022-08-22 NOTE — Telephone Encounter (Signed)
   Pre-operative Risk Assessment    Patient Name: Veronica Bradford  DOB: 31-Mar-1945 MRN: [id number]      Request for Surgical Clearance    Procedure:   Parathyroid surgery  Date of Surgery:  Clearance TBD                                 Surgeon:  Dr. Darnell Level Surgeon's Group or Practice Name:  [facility] Phone number:  [phone] Fax number:  [phone]   Type of Clearance Requested:   - Medical  - Pharmacy:  Hold Rivaroxaban (Xarelto) Not indicated.    Type of Anesthesia:  General under epidural   Additional requests/questions:    Signed, Emmit Pomfret   08/22/2022, 4:07 PM

## 2022-08-23 ENCOUNTER — Telehealth: Payer: Self-pay

## 2022-08-23 ENCOUNTER — Ambulatory Visit: Payer: Self-pay | Admitting: Surgery

## 2022-08-23 NOTE — Telephone Encounter (Signed)
Pharmacy please advise on holding Xarelto prior to crown lengthening procedure scheduled for 09/11/2022. Thank you.

## 2022-08-23 NOTE — Telephone Encounter (Signed)
Good Morning Andy.  We have received a surgical clearance request for parathyroid surgery. They were seen recently in clinic on 06/30/2022. Can you please comment on surgical clearance and if you feel they are at acceptable risk to proceed. Please forward you guidance and recommendations to P CV DIV PREOP.  Thank you, Robin Searing, NP

## 2022-08-23 NOTE — Telephone Encounter (Signed)
Patient with diagnosis of afib on Xarelto for anticoagulation.    Procedure: parathyroid surgery Date of procedure: TBD  CHA2DS2-VASc Score = 5  This indicates a 7.2% annual risk of stroke. The patient's score is based upon: CHF History: 1 HTN History: 1 Diabetes History: 0 Stroke History: 0 Vascular Disease History: 1 Age Score: 1 Gender Score: 1  CrCl 86mL/min using adjusted body weight Platelet count 282K  Per office protocol, patient can hold Xarelto for 2 days prior to procedure.    **This guidance is not considered finalized until pre-operative APP has relayed final recommendations.**

## 2022-08-23 NOTE — Telephone Encounter (Signed)
   Patient Name: Veronica Bradford  DOB: Nov 11, 1945 MRN: [id number]  Primary Cardiologist: Sherryl Manges, MD  Chart reviewed as part of pre-operative protocol coverage. Given past medical history and time since last visit, based on ACC/AHA guidelines, Veronica Bradford is at acceptable risk for the planned procedure without further cardiovascular testing.  The patient was advised that if she develops new symptoms prior to surgery to contact our office to arrange for a follow-up visit, and she verbalized understanding.  Per office protocol, patient can hold Xarelto for 2 days prior to procedure.   I will route this recommendation to the requesting party via Epic fax function and remove from pre-op pool.  Please call with questions.  Napoleon Form, Leodis Rains, NP 08/23/2022, 8:37 AM

## 2022-08-23 NOTE — Telephone Encounter (Addendum)
Should not need to hold anticoag for this procedure.

## 2022-08-23 NOTE — Telephone Encounter (Signed)
   Patient Name: Veronica Bradford  DOB: 1945-06-18 MRN: [id number]  Primary Cardiologist: Sherryl Manges, MD  Chart reviewed as part of pre-operative protocol coverage.   Simple dental extractions or procedures (i.e. 1-2 teeth) are considered low risk procedures per guidelines and generally do not require any specific cardiac clearance. It is also generally accepted that for simple extractions and dental cleanings, there is no need to interrupt blood thinner therapy.  SBE prophylaxis is not  required for the patient from a cardiac standpoint.  I will route this recommendation to the requesting party via Epic fax function and remove from pre-op pool.  Please call with questions.  Napoleon Form, Leodis Rains, NP 08/23/2022, 4:15 PM

## 2022-08-23 NOTE — Telephone Encounter (Signed)
...     Pre-operative Risk Assessment    Patient Name: Veronica Bradford  DOB: [birth year] MRN: [id number]  Last appt 06/30/22 NEXT APPT 10/05/22   Request for Surgical Clearance    Procedure:   CROWN LENGTHENING  Date of Surgery:  Clearance 09/11/22                                 Surgeon:  Pilar Plate Surgeon's Group or Practice Name:  [facility] Phone number:  [phone] Fax number:  [phone]   Type of Clearance Requested:   - Medical  - Pharmacy:  Hold Rivaroxaban (Xarelto)     Type of Anesthesia:  Not Indicated   Additional requests/questions:    Jola Babinski   08/23/2022, 2:16 PM

## 2022-08-25 DIAGNOSIS — Z96642 Presence of left artificial hip joint: Secondary | ICD-10-CM | POA: Diagnosis not present

## 2022-08-25 DIAGNOSIS — M7062 Trochanteric bursitis, left hip: Secondary | ICD-10-CM | POA: Diagnosis not present

## 2022-08-25 DIAGNOSIS — M1712 Unilateral primary osteoarthritis, left knee: Secondary | ICD-10-CM | POA: Diagnosis not present

## 2022-09-03 ENCOUNTER — Encounter: Payer: Self-pay | Admitting: Surgery

## 2022-09-03 DIAGNOSIS — E210 Primary hyperparathyroidism: Secondary | ICD-10-CM | POA: Diagnosis present

## 2022-09-03 NOTE — H&P (Signed)
REFERRING PHYSICIAN: Wanda Plump, MD  PROVIDER: Isak Sotomayor Myra Rude, MD   Chief Complaint: New Consultation (hyperparathyroidism)  History of Present Illness:  Patient returns to my practice having been seen previously for thyroid nodule and suspicion of primary hyperparathyroidism. It has been over 3 years since her last visit. Patient has been followed by her primary care physician, Dr. Porfirio Oar. Recent laboratory studies have shown an elevated serum calcium level up to 10.6. A intact PTH level has been as high as 192. 25-hydroxy vitamin D level was slightly low at 27. 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  Patient had previously been evaluated for parathyroid disease. This included ultrasound, sestamibi scan, and 4D CT scanning. The studies were all negative. Patient has developed more symptoms including fatigue and osteopenia.  Patient is followed by cardiology. She is on chronic anticoagulation. He has a pacemaker.  Review of Systems: A complete review of systems was obtained from the patient. I have reviewed this information and discussed as appropriate with the patient. See HPI as well for other ROS.  Review of Systems  Constitutional: Positive for malaise/fatigue.  HENT: Negative.  Eyes: Negative.  Respiratory: Negative.  Cardiovascular: Positive for leg swelling.  Gastrointestinal: Negative.  Genitourinary: Negative.  Musculoskeletal: Negative.  Skin: Negative.  Neurological: Negative.  Endo/Heme/Allergies: Negative.  Psychiatric/Behavioral: Negative.    Medical History: History reviewed. No pertinent past medical history.  Patient Active Problem List  Diagnosis  Primary hyperparathyroidism (CMS/HHS-HCC)   History reviewed. No pertinent surgical history.   Not on File  No current outpatient medications on file prior to visit.   No current  facility-administered medications on file prior to visit.   History reviewed. No pertinent family history.   Social History   Tobacco Use  Smoking Status Not on file  Smokeless Tobacco Not on file    Social History   Socioeconomic History  Marital status: Married   Social Determinants of Health   Financial Resource Strain: Low Risk (07/19/2022)  Received from [facility]  Overall Financial Resource Strain (CARDIA)  Difficulty of Paying Living Expenses: Not hard at all  Food Insecurity: No Food Insecurity (07/19/2022)  Received from [facility]  Hunger Vital Sign  Worried About Running Out of Food in the Last Year: Never true  Ran Out of Food in the Last Year: Never true  Transportation Needs: No Transportation Needs (07/19/2022)  Received [facility] - Transportation  Lack of Transportation (Medical): No  Lack of Transportation (Non-Medical): No  Physical Activity: Insufficiently Active (07/19/2022)  Received from [facility]  Exercise Vital Sign  Days of Exercise per Week: 4 days  Minutes of Exercise per Session: 20 min  Stress: No Stress Concern Present (07/19/2022)  Received from [facility] - Occupational Stress Questionnaire  Feeling of Stress : Only a little  Social Connections: Moderately Integrated (07/19/2022)  [facility]  Social Connection and Isolation Panel [NHANES]  Frequency of Communication with Friends and Family: More than three times a week  Frequency of Social Gatherings with Friends and Family: More than three times a week  Attends Religious Services: Never  Database administrator or Organizations: No  Attends Engineer, structural: 1 to 4 times per year  Marital Status: Married   Objective:   Vitals:  BP: (!) 142/68  Pulse: 88  Temp: 36.7 C (98 F)  SpO2: 99%  Weight: (!) 116.5 kg (256 lb 12.8 oz)  Height: 170.2 cm (5\' 7" )  PainSc: 0-No pain   Body mass index is  40.22 kg/m.  Physical Exam   GENERAL APPEARANCE Comfortable, no acute issues Development: normal Gross deformities: none  SKIN Rash, lesions, ulcers: none Induration, erythema: none Nodules: none palpable  EYES Conjunctiva and lids: normal Pupils: equal and reactive  EARS, NOSE, MOUTH, THROAT External ears: no lesion or deformity External nose: no lesion or deformity Hearing: grossly normal  NECK Symmetric: yes Trachea: midline Thyroid: no palpable nodules in the thyroid bed  ABDOMEN Not assessed  GENITOURINARY/RECTAL Not assessed  MUSCULOSKELETAL Station and gait: normal Digits and nails: no clubbing or cyanosis Muscle strength: grossly normal all extremities Range of motion: grossly normal all extremities Deformity: none  LYMPHATIC Cervical: none palpable Supraclavicular: none palpable  PSYCHIATRIC Oriented to person, place, and time: yes Mood and affect: normal for situation Judgment and insight: appropriate for situation   Assessment and Plan:   Primary hyperparathyroidism (CMS/HHS-HCC)  Patient returns to my practice on referral from her primary care physician for evaluation of hyperparathyroidism.  Patient provided with a copy of "Parathyroid Surgery: Treatment for Your Parathyroid Gland Problem", published by [employer], 12 pages. Book reviewed and explained to patient during visit today.  We reviewed her clinical history. We reviewed her recent laboratory studies. We reviewed the ultrasound results from June 2024. 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 I have recommended minimally invasive parathyroidectomy as an outpatient surgical procedure. We discussed the size and location of the surgical incision. We discussed potential complications including recurrent laryngeal nerve injury. We discussed the postoperative recovery. We discussed the possibility of neck exploration if an adenoma or  not identified at the site indicated by ultrasound. The patient understands and wishes to proceed with surgery in the near future.  Patient will require clearance by cardiology prior to scheduling her procedure.  Darnell Level, MD [facility] practice Office: [phone]

## 2022-09-05 ENCOUNTER — Encounter: Payer: Self-pay | Admitting: Internal Medicine

## 2022-09-05 ENCOUNTER — Encounter (HOSPITAL_COMMUNITY): Payer: Self-pay

## 2022-09-05 DIAGNOSIS — I83812 Varicose veins of left lower extremities with pain: Secondary | ICD-10-CM | POA: Diagnosis not present

## 2022-09-05 DIAGNOSIS — R600 Localized edema: Secondary | ICD-10-CM | POA: Diagnosis not present

## 2022-09-05 DIAGNOSIS — I83892 Varicose veins of left lower extremities with other complications: Secondary | ICD-10-CM | POA: Diagnosis not present

## 2022-09-05 DIAGNOSIS — I8312 Varicose veins of left lower extremity with inflammation: Secondary | ICD-10-CM | POA: Diagnosis not present

## 2022-09-05 DIAGNOSIS — I8311 Varicose veins of right lower extremity with inflammation: Secondary | ICD-10-CM | POA: Diagnosis not present

## 2022-09-05 NOTE — Progress Notes (Signed)
PERIOPERATIVE PRESCRIPTION FOR IMPLANTED CARDIAC DEVICE PROGRAMMING  Patient Information: Name:  Veronica Bradford  DOB:  07/12/1945  MRN:  [id number]    Planned Procedure: Left parathyriodectomy  Surgeon: Darnell Level  Date of Procedure: 09-14-22  Cautery will be used.N/A  Position during surgery:  N/A   Please send documentation back to:  [facility] (Fax # [phone])  Device Information:  Clinic EP Physician:  Sherryl Manges, MD   Device Type:  Pacemaker Manufacturer and Phone #:  [employer]: [phone] Pacemaker Dependent?:  No. Date of Last Device Check:  08/01/2022 Normal Device Function?:  Yes.    Electrophysiologist's Recommendations:  Have magnet available. Provide continuous ECG monitoring when magnet is used or reprogramming is to be performed.  Procedure will likely interfere with device function.  Device should be programmed:  Asynchronous pacing during procedure and returned to normal programming after procedure  Per Device Clinic Standing Orders, Lenor Coffin, RN  3:12 PM 09/05/2022

## 2022-09-05 NOTE — Progress Notes (Addendum)
PCP - Willow Ora, MD  Theron Arista 07-24-22 epic Cardiologist -  Sherryl Manges Robin Searing , NP clearance 08-23-22 epic  LOV 06-30-22 epic   PPM/ICD - PPM Device Orders -on chart Rep Notified - Kerry Fort  Last device check-08-01-22 epic  Chest x-ray -  EKG - 06-30-22 epic Stress Test -  ECHO -  Cardiac Cath -  CT coronary-08-03-22 epic CT chest 05-17-22 epic  Sleep Study -  CPAP -   Fasting Blood Sugar -  Checks Blood Sugar _____ times a day  Blood Thinner Instructions:Xarelto hold 2 days Aspirin Instructions:  ERAS Protcol - PRE-SURGERY    COVID vaccine -yes  Activity--Able to climb stairs with no Cp but some SOB Does ADL's with no symptoms  Anesthesia review: Afib, HTN, OSA, Pre-DM, Pacemaker  Patient denies shortness of breath, fever, cough and chest pain at PAT appointment   All instructions explained to the patient, with a verbal understanding of the material. Patient agrees to go over the instructions while at home for a better understanding. Patient also instructed to self quarantine after being tested for COVID-19. The opportunity to ask questions was provided.

## 2022-09-05 NOTE — Patient Instructions (Addendum)
SURGICAL WAITING ROOM VISITATION  Patients having surgery or a procedure may have no more than 2 support people in the waiting area - these visitors may rotate.    Children under the age of 63 must have an adult with them who is not the patient.  If the patient needs to stay at the hospital during part of their recovery, the visitor guidelines for inpatient rooms apply. Pre-op nurse will coordinate an appropriate time for 1 support person to accompany patient in pre-op.  This support person may not rotate.    Please refer to the [facility] website for the visitor guidelines for Inpatients (after your surgery is over and you are in a regular room).       Your procedure is scheduled on: 09-14-22   Report to [facility] Main Entrance    Report to admitting at       0745 AM   Call this number if you have problems the morning of surgery [phone]   Do not eat food :After Midnight.   After Midnight you may have the following liquids until _0700 _____ AM/ DAY OF SURGERY  Then nothing by mouth  Water Non-Citrus Juices (without pulp, NO RED-Apple, White grape, White cranberry) Black Coffee (NO MILK/CREAM OR CREAMERS, sugar ok)  Clear Tea (NO MILK/CREAM OR CREAMERS, sugar ok) regular and decaf                             Plain Jell-O (NO RED)                                           Fruit ices (not with fruit pulp, NO RED)                                     Popsicles (NO RED)                                                               Sports drinks like Gatorade (NO RED)                             If you have questions, please contact your surgeon's office.   FOLLOW ANY ADDITIONAL PRE OP INSTRUCTIONS YOU RECEIVED FROM YOUR SURGEON'S OFFICE!!!     Oral Hygiene is also important to reduce your risk of infection.                                    Remember - BRUSH YOUR TEETH THE MORNING OF SURGERY WITH YOUR REGULAR TOOTHPASTE  DENTURES WILL BE REMOVED PRIOR TO  SURGERY PLEASE DO NOT APPLY "Poly grip" OR ADHESIVES!!!   Do NOT smoke after Midnight   Stop all vitamins and herbal supplements 7 days before surgery.   Take these medicines the morning of surgery with A SIP OF WATER: pantoprazole, flonase, dofetilide, diltiazem if needed, tylenol if needed   Bring CPAP mask and  tubing day of surgery.                              You may not have any metal on your body including hair pins, jewelry, and body piercing             Do not wear make-up, lotions, powders, perfumes/cologne, or deodorant  Do not wear nail polish including gel and S&S, artificial/acrylic nails, or any other type of covering on natural nails including finger and toenails. If you have artificial nails, gel coating, etc. that needs to be removed by a nail salon please have this removed prior to surgery or surgery may need to be canceled/ delayed if the surgeon/ anesthesia feels like they are unable to be safely monitored.   Do not shave  48 hours prior to surgery.                Do not bring valuables to the hospital.  IS NOT             RESPONSIBLE   FOR VALUABLES.   Contacts, glasses, dentures or bridgework may not be worn into surgery.   Bring small overnight bag day of surgery.   DO NOT BRING YOUR HOME MEDICATIONS TO THE HOSPITAL. PHARMACY WILL DISPENSE MEDICATIONS LISTED ON YOUR MEDICATION LIST TO YOU DURING YOUR ADMISSION IN THE HOSPITAL!    Patients discharged on the day of surgery will not be allowed to drive home.  Someone NEEDS to stay with you for the first 24 hours after anesthesia.   Special Instructions: Bring a copy of your healthcare power of attorney and living will documents the day of surgery if you haven't scanned them before.              Please read over the following fact sheets you were given: IF YOU HAVE QUESTIONS ABOUT YOUR PRE-OP INSTRUCTIONS PLEASE CALL [phone]    If you test positive for Covid or have been in contact with anyone  that has tested positive in the last 10 days please notify you surgeon.     - Preparing for Surgery Before surgery, you can play an important role.  Because skin is not sterile, your skin needs to be as free of germs as possible.  You can reduce the number of germs on your skin by washing with CHG (chlorahexidine gluconate) soap before surgery.  CHG is an antiseptic cleaner which kills germs and bonds with the skin to continue killing germs even after washing. Please DO NOT use if you have an allergy to CHG or antibacterial soaps.  If your skin becomes reddened/irritated stop using the CHG and inform your nurse when you arrive at Short Stay. Do not shave (including legs and underarms) for at least 48 hours prior to the first CHG shower.  You may shave your face/neck. Please follow these instructions carefully:  1.  Shower with CHG Soap the night before surgery and the  morning of Surgery.  2.  If you choose to wash your hair, wash your hair first as usual with your  normal  shampoo.  3.  After you shampoo, rinse your hair and body thoroughly to remove the  shampoo.                           4.  Use CHG as you would any other liquid soap.  You can  apply chg directly  to the skin and wash                       Gently with a scrungie or clean washcloth.  5.  Apply the CHG Soap to your body ONLY FROM THE NECK DOWN.   Do not use on face/ open                           Wound or open sores. Avoid contact with eyes, ears mouth and genitals (private parts).                       Wash face,  Genitals (private parts) with your normal soap.             6.  Wash thoroughly, paying special attention to the area where your surgery  will be performed.  7.  Thoroughly rinse your body with warm water from the neck down.  8.  DO NOT shower/wash with your normal soap after using and rinsing off  the CHG Soap.                9.  Pat yourself dry with a clean towel.            10.  Wear clean pajamas.             11.  Place clean sheets on your bed the night of your first shower and do not  sleep with pets. Day of Surgery : Do not apply any lotions/deodorants the morning of surgery.  Please wear clean clothes to the hospital/surgery center.  FAILURE TO FOLLOW THESE INSTRUCTIONS MAY RESULT IN THE CANCELLATION OF YOUR SURGERY PATIENT SIGNATURE_________________________________  NURSE SIGNATURE__________________________________  ________________________________________________________________________

## 2022-09-06 ENCOUNTER — Encounter: Payer: Self-pay | Admitting: Internal Medicine

## 2022-09-06 ENCOUNTER — Encounter (HOSPITAL_COMMUNITY): Payer: Self-pay

## 2022-09-06 ENCOUNTER — Encounter (HOSPITAL_COMMUNITY)
Admission: RE | Admit: 2022-09-06 | Discharge: 2022-09-06 | Disposition: A | Discharge status: Home / Self Care | Payer: Medicare Other | Source: Ambulatory Visit | Attending: Surgery | Admitting: Surgery

## 2022-09-06 ENCOUNTER — Other Ambulatory Visit: Payer: Self-pay

## 2022-09-06 VITALS — BP 145/65 | HR 63 | Temp 98.4°F | Resp 16 | Ht 67.5 in | Wt 250.0 lb

## 2022-09-06 DIAGNOSIS — R739 Hyperglycemia, unspecified: Secondary | ICD-10-CM | POA: Insufficient documentation

## 2022-09-06 DIAGNOSIS — G4733 Obstructive sleep apnea (adult) (pediatric): Secondary | ICD-10-CM | POA: Diagnosis not present

## 2022-09-06 DIAGNOSIS — Z950 Presence of cardiac pacemaker: Secondary | ICD-10-CM | POA: Insufficient documentation

## 2022-09-06 DIAGNOSIS — I509 Heart failure, unspecified: Secondary | ICD-10-CM | POA: Insufficient documentation

## 2022-09-06 DIAGNOSIS — Z87891 Personal history of nicotine dependence: Secondary | ICD-10-CM | POA: Insufficient documentation

## 2022-09-06 DIAGNOSIS — I110 Hypertensive heart disease with heart failure: Secondary | ICD-10-CM | POA: Diagnosis not present

## 2022-09-06 DIAGNOSIS — K219 Gastro-esophageal reflux disease without esophagitis: Secondary | ICD-10-CM | POA: Diagnosis not present

## 2022-09-06 DIAGNOSIS — Z01812 Encounter for preprocedural laboratory examination: Secondary | ICD-10-CM | POA: Insufficient documentation

## 2022-09-06 DIAGNOSIS — E210 Primary hyperparathyroidism: Secondary | ICD-10-CM | POA: Diagnosis not present

## 2022-09-06 DIAGNOSIS — I10 Essential (primary) hypertension: Secondary | ICD-10-CM

## 2022-09-06 DIAGNOSIS — I480 Paroxysmal atrial fibrillation: Secondary | ICD-10-CM | POA: Insufficient documentation

## 2022-09-06 HISTORY — DX: Cardiac arrhythmia, unspecified: I49.9

## 2022-09-06 HISTORY — DX: Other complications of anesthesia, initial encounter: T88.59XA

## 2022-09-06 HISTORY — DX: Presence of cardiac pacemaker: Z95.0

## 2022-09-06 HISTORY — DX: Heart failure, unspecified: I50.9

## 2022-09-06 LAB — BASIC METABOLIC PANEL
Anion gap: 8 (ref 5–15)
BUN: 17 mg/dL (ref 8–23)
CO2: 27 mmol/L (ref 22–32)
Calcium: 10.9 mg/dL — ABNORMAL HIGH (ref 8.9–10.3)
Chloride: 106 mmol/L (ref 98–111)
Creatinine, Ser: 0.87 mg/dL (ref 0.44–1.00)
GFR, Estimated: >60 mL/min (ref >60)
Glucose, Bld: 101 mg/dL — ABNORMAL HIGH (ref 70–99)
Potassium: 4 mmol/L (ref 3.5–5.1)
Sodium: 141 mmol/L (ref 135–145)

## 2022-09-06 LAB — CBC
HCT: 37.9 % (ref 36.0–46.0)
Hemoglobin: 11.7 g/dL — ABNORMAL LOW (ref 12.0–15.0)
MCH: 29.5 pg (ref 26.0–34.0)
MCHC: 30.9 g/dL (ref 30.0–36.0)
MCV: 95.5 fL (ref 80.0–100.0)
Platelets: 292 10*3/uL (ref 150–400)
RBC: 3.97 MIL/uL (ref 3.87–5.11)
RDW: 14.9 % (ref 11.5–15.5)
WBC: 5.7 10*3/uL (ref 4.0–10.5)
nRBC: 0 % (ref 0.0–0.2)

## 2022-09-06 LAB — HEMOGLOBIN A1C
Hgb A1c MFr Bld: 4.4 % — ABNORMAL LOW (ref 4.8–5.6)
Mean Plasma Glucose: 79.58 mg/dL

## 2022-09-07 NOTE — Anesthesia Preprocedure Evaluation (Addendum)
Anesthesia Evaluation  Patient identified by MRN, date of birth, ID band Patient awake    Reviewed: Allergy & Precautions, NPO status , Patient's Chart, lab work & pertinent test results  Airway Mallampati: II  TM Distance: >3 FB Neck ROM: Full    Dental no notable dental hx. (+) Teeth Intact, Dental Advisory Given   Pulmonary sleep apnea and Continuous Positive Airway Pressure Ventilation , former smoker   Pulmonary exam normal breath sounds clear to auscultation       Cardiovascular hypertension, Normal cardiovascular exam+ dysrhythmias (on xarelto) Atrial Fibrillation + pacemaker  Rhythm:Regular Rate:Normal  Echo 02/11/21 1. Left ventricular ejection fraction, by estimation, is 65 to 70%. The  left ventricle has normal function. The left ventricle has no regional  wall motion abnormalities. There is moderate asymmetric left ventricular  hypertrophy of the basal-septal  segment. Left ventricular diastolic parameters are indeterminate.   2. Right ventricular systolic function is normal. The right ventricular  size is mildly enlarged. There is normal pulmonary artery systolic  pressure. The estimated right ventricular systolic pressure is 30.2 mmHg.   3. The mitral valve is normal in structure. Trivial mitral valve  regurgitation. No evidence of mitral stenosis.   4. The aortic valve is tricuspid. Aortic valve regurgitation is not  visualized. No aortic stenosis is present.   5. The inferior vena cava is normal in size with greater than 50%  respiratory variability, suggesting right atrial pressure of 3 mmHg.       Neuro/Psych    GI/Hepatic Neg liver ROS,GERD  ,,  Endo/Other  negative endocrine ROS    Renal/GU Lab Results      Component                Value               Date                       K                        4.0                 09/06/2022                 CREATININE               0.87                09/06/2022                      GLUCOSE                  101 (H)             09/06/2022                Musculoskeletal negative musculoskeletal ROS (+)    Abdominal  (+) + obese (BMI 38.6)  Peds  Hematology Lab Results      Component                Value               Date                      WBC                      5.7  09/06/2022                HGB                      11.7 (L)            09/06/2022                HCT                      37.9                09/06/2022                MCV                      95.5                09/06/2022                PLT                      292                 09/06/2022              Anesthesia Other Findings All: see list  Reproductive/Obstetrics                             Anesthesia Physical Anesthesia Plan  ASA: 3  Anesthesia Plan: General   Post-op Pain Management:    Induction: Intravenous  PONV Risk Score and Plan: 3 and Treatment may vary due to age or medical condition and Ondansetron  Airway Management Planned: Oral ETT  Additional Equipment: None  Intra-op Plan:   Post-operative Plan: Extubation in OR  Informed Consent: I have reviewed the patients History and Physical, chart, labs and discussed the procedure including the risks, benefits and alternatives for the proposed anesthesia with the patient or authorized representative who has indicated his/her understanding and acceptance.     Dental advisory given  Plan Discussed with: CRNA  Anesthesia Plan Comments: (See PAT note 09/06/2022)       Anesthesia Quick Evaluation

## 2022-09-07 NOTE — Progress Notes (Signed)
Anesthesia Chart Review   Case: [id number] Date/Time: 09/14/22 0945   Procedure: LEFT PARATHYROIDECTOMY (Left)   Anesthesia type: General   Pre-op diagnosis: PRIMARY  HYPERPARATHYROIDISM   Location: [facility] ROOM 01 / [facility]   Surgeons: Darnell Level, MD       DISCUSSION:76 y.o. former smoker with h/o HTN, GERD, sleep apnea, PAF, CHF, pacemaker in place (asynchronous pacing during procedure per device orders, rep contacted by PAT nurse), chronic LE edema, primary hyperparathyroidism scheduled for above procedure 09/14/22 with Dr. Darnell Level.   Per cardiology preoperative evaluation 08/23/22, "Chart reviewed as part of pre-operative protocol coverage. Given past medical history and time since last visit, based on ACC/AHA guidelines, Veronica Bradford is at acceptable risk for the planned procedure without further cardiovascular testing. The patient was advised that if she develops new symptoms prior to surgery to contact our office to arrange for a follow-up visit, and she verbalized understanding.   Per office protocol, patient can hold Xarelto for 2 days prior to procedure."  VS: BP (!) 145/65   Pulse 63   Temp 36.9 C (Oral)   Resp 16   Ht 5' 7.5" (1.715 m)   Wt 113.4 kg   SpO2 100%   BMI 38.58 kg/m   PROVIDERS: Wanda Plump, MD is PCP   Sherryl Manges, MD is Cardiologist  LABS: Labs reviewed: Acceptable for surgery. (all labs ordered are listed, but only abnormal results are displayed)  Labs Reviewed  HEMOGLOBIN A1C - Abnormal; Notable for the following components:      Result Value   Hgb A1c MFr Bld 4.4 (*)    All other components within normal limits  BASIC METABOLIC PANEL - Abnormal; Notable for the following components:   Glucose, Bld 101 (*)    Calcium 10.9 (*)    All other components within normal limits  CBC - Abnormal; Notable for the following components:   Hemoglobin 11.7 (*)    All other components within normal limits     IMAGES:   EKG:   CV: Echo 02/11/21 1.  Left ventricular ejection fraction, by estimation, is 65 to 70%. The  left ventricle has normal function. The left ventricle has no regional  wall motion abnormalities. There is moderate asymmetric left ventricular  hypertrophy of the basal-septal  segment. Left ventricular diastolic parameters are indeterminate.   2. Right ventricular systolic function is normal. The right ventricular  size is mildly enlarged. There is normal pulmonary artery systolic  pressure. The estimated right ventricular systolic pressure is 30.2 mmHg.   3. The mitral valve is normal in structure. Trivial mitral valve  regurgitation. No evidence of mitral stenosis.   4. The aortic valve is tricuspid. Aortic valve regurgitation is not  visualized. No aortic stenosis is present.   5. The inferior vena cava is normal in size with greater than 50%  respiratory variability, suggesting right atrial pressure of 3 mmHg.   Myocardial Perfusion 08/08/2019 Nuclear stress EF: 71%. The left ventricular ejection fraction is hyperdynamic (>65%). This is a low risk study. There is no evidence of ischemia and no evidence of previous infarction. The study is normal. Past Medical History:  Diagnosis Date   Atrial fibrillation (HCC)    Atrial fibrillation (HCC)    Back pain    CHF (congestive heart failure) (HCC)    grade 2 HF   Chronic sinusitis    s/p 2 surgeries remotely, Dr Ezzard Standing   Closed fracture of metatarsal of left foot  L foot, fifth metatarsal   Complication of anesthesia    woke up once during procedure   Dysrhythmia    a-fib   Ectopic pregnancy    Endometrial cancer (HCC) 2004   Early diagnosis   GERD (gastroesophageal reflux disease)    Hyperlipemia    Hypertension    IBS (irritable bowel syndrome) 08/13/2014   Dx 2015, diarrhea on-off, Dr Matthias Hughs    Lumbar radiculopathy 08/2012   MRI in 08/2012   Mild hyperparathyroidism (HCC)    Osteopenia    PAF (paroxysmal atrial fibrillation) (HCC)    Presence of  permanent cardiac pacemaker    Sciatica of right side 08/2013   Received Depomedrol 80 mg injection   Sleep apnea 09/09/2013   Dx with OSA in 2015, by Dr. Graciela Husbands,   wears CPAPAPAP    Past Surgical History:  Procedure Laterality Date   ATRIAL FIBRILLATION ABLATION N/A 09/04/2017   Procedure: ATRIAL FIBRILLATION ABLATION;  Surgeon: Hillis Range, MD;  Location: [facility] INVASIVE CV LAB;  Service: Cardiovascular;  Laterality: N/A;   BIOPSY  03/11/2020   Procedure: BIOPSY;  Surgeon: Bernette Redbird, MD;  Location: [facility];  Service: Endoscopy;;  EGD and COLON   CARPAL TUNNEL RELEASE Right 06/17/2019   CARPAL TUNNEL RELEASE Left 11/2020   CHOLECYSTECTOMY  ~2006   COLONOSCOPY WITH PROPOFOL N/A 03/11/2020   Procedure: COLONOSCOPY WITH PROPOFOL;  Surgeon: Bernette Redbird, MD;  Location: [facility];  Service: Endoscopy;  Laterality: N/A;   DILATION AND CURETTAGE OF UTERUS     after SAB   ESOPHAGOGASTRODUODENOSCOPY  02/09/2014   Dr. Matthias Hughs   ESOPHAGOGASTRODUODENOSCOPY (EGD) WITH PROPOFOL N/A 03/11/2020   Procedure: ESOPHAGOGASTRODUODENOSCOPY (EGD) WITH PROPOFOL;  Surgeon: Bernette Redbird, MD;  Location: [facility];  Service: Endoscopy;  Laterality: N/A;   FINGER SURGERY Left    index   NASAL SINUS SURGERY     x 2 remotely, Dr Ezzard Standing   PACEMAKER IMPLANT N/A 09/13/2020   Procedure: PACEMAKER IMPLANT;  Surgeon: Duke Salvia, MD;  [facility] INVASIVE CV LAB;  Service: Cardiovascular;  Laterality: N/A;   TONSILLECTOMY     TOTAL ABDOMINAL HYSTERECTOMY W/ BILATERAL SALPINGOOPHORECTOMY     TOTAL HIP ARTHROPLASTY Left 07/18/2021   Procedure: TOTAL HIP ARTHROPLASTY ANTERIOR APPROACH;  Surgeon: Samson Frederic, MD;  Location: [facility];  Service: Orthopedics;  Laterality: Left;   TOTAL KNEE ARTHROPLASTY Right ~2008    MEDICATIONS:  acetaminophen (TYLENOL) 500 MG tablet   acetaminophen-codeine (TYLENOL #3) 300-30 MG tablet   ARTIFICIAL TEAR SOLUTION OP   cephALEXin (KEFLEX) 500 MG capsule    Cholecalciferol (VITAMIN D) 50 MCG (2000 UT) tablet   Chromium Picolinate 500 MCG TABS   Coenzyme Q10 (CO Q 10) 100 MG CAPS   colestipol (COLESTID) 1 g tablet   CRANBERRY EXTRACT PO   diltiazem (CARDIZEM) 30 MG tablet   dofetilide (TIKOSYN) 250 MCG capsule   estradiol (ESTRACE) 0.1 MG/GM vaginal cream   Ferrous Sulfate (IRON PO)   fluticasone (FLONASE) 50 MCG/ACT nasal spray   folic acid (FOLVITE) 800 MCG tablet   furosemide (LASIX) 40 MG tablet   MAGNESIUM GLYCINATE PO   metoprolol tartrate (LOPRESSOR) 50 MG tablet   olopatadine (PATANOL) 0.1 % ophthalmic solution   pantoprazole (PROTONIX) 40 MG tablet   potassium chloride SA (KLOR-CON M) 20 MEQ tablet   pravastatin (PRAVACHOL) 40 MG tablet   rivaroxaban (XARELTO) 20 MG TABS tablet   spironolactone (ALDACTONE) 25 MG tablet   sucralfate (CARAFATE) 1 g tablet   valsartan (  DIOVAN) 40 MG tablet   Vitamin D, Ergocalciferol, (DRISDOL) 1.25 MG (50000 UNIT) CAPS capsule   No current facility-administered medications for this encounter.     Jodell Cipro Ward, PA-C [facility] [phone]

## 2022-09-10 ENCOUNTER — Encounter: Payer: Self-pay | Admitting: Internal Medicine

## 2022-09-12 ENCOUNTER — Ambulatory Visit (INDEPENDENT_AMBULATORY_CARE_PROVIDER_SITE_OTHER): Payer: Medicare Other

## 2022-09-12 DIAGNOSIS — I495 Sick sinus syndrome: Secondary | ICD-10-CM

## 2022-09-13 LAB — CUP PACEART REMOTE DEVICE CHECK
Battery Remaining Longevity: 95 mo
Battery Remaining Percentage: 83 %
Battery Voltage: 3.01 V
Brady Statistic AP VP Percent: 2.6 %
Brady Statistic AP VS Percent: 93 %
Brady Statistic AS VP Percent: 1 %
Brady Statistic AS VS Percent: 3.6 %
Brady Statistic RA Percent Paced: 96 %
Brady Statistic RV Percent Paced: 3 %
Date Time Interrogation Session: 20240910022722
Implantable Lead Connection Status: 753985
Implantable Lead Connection Status: 753985
Implantable Lead Implant Date: 20220912
Implantable Lead Implant Date: 20220912
Implantable Lead Location: 753859
Implantable Lead Location: 753860
Implantable Lead Model: 3830
Implantable Pulse Generator Implant Date: 20220912
Lead Channel Impedance Value: 390 Ohm
Lead Channel Impedance Value: 630 Ohm
Lead Channel Pacing Threshold Amplitude: 0.75 V
Lead Channel Pacing Threshold Amplitude: 1 V
Lead Channel Pacing Threshold Pulse Width: 0.4 ms
Lead Channel Pacing Threshold Pulse Width: 0.4 ms
Lead Channel Sensing Intrinsic Amplitude: 1.2 mV
Lead Channel Sensing Intrinsic Amplitude: 12 mV
Lead Channel Setting Pacing Amplitude: 2 V
Lead Channel Setting Pacing Amplitude: 2.5 V
Lead Channel Setting Pacing Pulse Width: 0.4 ms
Lead Channel Setting Sensing Sensitivity: 2 mV
Pulse Gen Model: 2272
Pulse Gen Serial Number: 3956585

## 2022-09-14 ENCOUNTER — Ambulatory Visit (HOSPITAL_BASED_OUTPATIENT_CLINIC_OR_DEPARTMENT_OTHER): Payer: Medicare Other | Admitting: Anesthesiology

## 2022-09-14 ENCOUNTER — Other Ambulatory Visit: Payer: Self-pay

## 2022-09-14 ENCOUNTER — Encounter (HOSPITAL_COMMUNITY): Payer: Self-pay | Admitting: Surgery

## 2022-09-14 ENCOUNTER — Other Ambulatory Visit (HOSPITAL_COMMUNITY): Payer: Self-pay | Admitting: Surgery

## 2022-09-14 ENCOUNTER — Encounter (HOSPITAL_COMMUNITY)
Admission: RE | Disposition: A | Discharge status: Home / Self Care | Payer: Self-pay | Source: Ambulatory Visit | Attending: Surgery

## 2022-09-14 ENCOUNTER — Ambulatory Visit (HOSPITAL_COMMUNITY)
Admission: RE | Admit: 2022-09-14 | Discharge: 2022-09-14 | Disposition: A | Discharge status: Home / Self Care | Payer: Medicare Other | Source: Ambulatory Visit | Attending: Surgery | Admitting: Surgery

## 2022-09-14 ENCOUNTER — Ambulatory Visit (HOSPITAL_COMMUNITY): Payer: Medicare Other | Admitting: Physician Assistant

## 2022-09-14 DIAGNOSIS — G4733 Obstructive sleep apnea (adult) (pediatric): Secondary | ICD-10-CM | POA: Diagnosis not present

## 2022-09-14 DIAGNOSIS — D351 Benign neoplasm of parathyroid gland: Secondary | ICD-10-CM | POA: Insufficient documentation

## 2022-09-14 DIAGNOSIS — Z6838 Body mass index (BMI) 38.0-38.9, adult: Secondary | ICD-10-CM | POA: Insufficient documentation

## 2022-09-14 DIAGNOSIS — E210 Primary hyperparathyroidism: Secondary | ICD-10-CM

## 2022-09-14 DIAGNOSIS — I4891 Unspecified atrial fibrillation: Secondary | ICD-10-CM | POA: Insufficient documentation

## 2022-09-14 DIAGNOSIS — Z7901 Long term (current) use of anticoagulants: Secondary | ICD-10-CM | POA: Insufficient documentation

## 2022-09-14 DIAGNOSIS — I110 Hypertensive heart disease with heart failure: Secondary | ICD-10-CM | POA: Diagnosis not present

## 2022-09-14 DIAGNOSIS — G4730 Sleep apnea, unspecified: Secondary | ICD-10-CM | POA: Diagnosis not present

## 2022-09-14 DIAGNOSIS — M8580 Other specified disorders of bone density and structure, unspecified site: Secondary | ICD-10-CM | POA: Diagnosis not present

## 2022-09-14 DIAGNOSIS — I5022 Chronic systolic (congestive) heart failure: Secondary | ICD-10-CM | POA: Diagnosis not present

## 2022-09-14 DIAGNOSIS — Z87891 Personal history of nicotine dependence: Secondary | ICD-10-CM

## 2022-09-14 DIAGNOSIS — I10 Essential (primary) hypertension: Secondary | ICD-10-CM | POA: Insufficient documentation

## 2022-09-14 DIAGNOSIS — C73 Malignant neoplasm of thyroid gland: Secondary | ICD-10-CM | POA: Diagnosis not present

## 2022-09-14 DIAGNOSIS — Z950 Presence of cardiac pacemaker: Secondary | ICD-10-CM | POA: Diagnosis not present

## 2022-09-14 DIAGNOSIS — E669 Obesity, unspecified: Secondary | ICD-10-CM | POA: Insufficient documentation

## 2022-09-14 HISTORY — PX: PARATHYROIDECTOMY: SHX19

## 2022-09-14 SURGERY — PARATHYROIDECTOMY
Anesthesia: General | Laterality: Left

## 2022-09-14 MED ORDER — ACETAMINOPHEN 10 MG/ML IV SOLN
1000.0000 mg | Freq: Once | INTRAVENOUS | Status: DC | PRN
  Administered 2022-09-14: 1000 mg via INTRAVENOUS

## 2022-09-14 MED ORDER — MIDAZOLAM HCL 2 MG/2ML IJ SOLN
INTRAMUSCULAR | Status: AC
  Filled 2022-09-14: qty 2

## 2022-09-14 MED ORDER — ORAL CARE MOUTH RINSE: 15.0000 mL | Freq: Once | OROMUCOSAL | Status: AC

## 2022-09-14 MED ORDER — FENTANYL CITRATE (PF) 100 MCG/2ML IJ SOLN
INTRAMUSCULAR | Status: AC
  Filled 2022-09-14: qty 2

## 2022-09-14 MED ORDER — ROCURONIUM BROMIDE 10 MG/ML (PF) SYRINGE
PREFILLED_SYRINGE | INTRAVENOUS | Status: AC
  Filled 2022-09-14: qty 10

## 2022-09-14 MED ORDER — DEXAMETHASONE SODIUM PHOSPHATE 10 MG/ML IJ SOLN
INTRAMUSCULAR | Status: AC
  Filled 2022-09-14: qty 1

## 2022-09-14 MED ORDER — SUGAMMADEX SODIUM 200 MG/2ML IV SOLN
INTRAVENOUS | Status: DC | PRN
  Administered 2022-09-14: 400 mg via INTRAVENOUS

## 2022-09-14 MED ORDER — PROPOFOL 10 MG/ML IV BOLUS
INTRAVENOUS | Status: DC | PRN
Start: 2022-09-14 — End: 2022-09-14
  Administered 2022-09-14: 140 mg via INTRAVENOUS

## 2022-09-14 MED ORDER — MIDAZOLAM HCL 5 MG/5ML IJ SOLN
INTRAMUSCULAR | Status: DC | PRN
  Administered 2022-09-14: 2 mg via INTRAVENOUS

## 2022-09-14 MED ORDER — BUPIVACAINE HCL (PF) 0.25 % IJ SOLN
INTRAMUSCULAR | Status: AC
  Filled 2022-09-14: qty 30

## 2022-09-14 MED ORDER — CIPROFLOXACIN IN D5W 400 MG/200ML IV SOLN
400.0000 mg | INTRAVENOUS | Status: AC
  Administered 2022-09-14: 400 mg via INTRAVENOUS
  Filled 2022-09-14: qty 200

## 2022-09-14 MED ORDER — LIDOCAINE HCL (PF) 2 % IJ SOLN
INTRAMUSCULAR | Status: AC
  Filled 2022-09-14: qty 5

## 2022-09-14 MED ORDER — ROCURONIUM BROMIDE 10 MG/ML (PF) SYRINGE
PREFILLED_SYRINGE | INTRAVENOUS | Status: DC | PRN
  Administered 2022-09-14: 70 mg via INTRAVENOUS

## 2022-09-14 MED ORDER — 0.9 % SODIUM CHLORIDE (POUR BTL) OPTIME
TOPICAL | Status: DC | PRN
  Administered 2022-09-14: 1000 mL

## 2022-09-14 MED ORDER — FENTANYL CITRATE PF 50 MCG/ML IJ SOSY
25.0000 ug | PREFILLED_SYRINGE | INTRAMUSCULAR | Status: DC | PRN
  Administered 2022-09-14: 25 ug via INTRAVENOUS

## 2022-09-14 MED ORDER — BUPIVACAINE HCL 0.25 % IJ SOLN
INTRAMUSCULAR | Status: DC | PRN
  Administered 2022-09-14: 10 mL

## 2022-09-14 MED ORDER — CHLORHEXIDINE GLUCONATE 0.12 % MT SOLN
15.0000 mL | Freq: Once | OROMUCOSAL | Status: AC
  Administered 2022-09-14: 15 mL via OROMUCOSAL

## 2022-09-14 MED ORDER — FENTANYL CITRATE (PF) 100 MCG/2ML IJ SOLN
INTRAMUSCULAR | Status: DC | PRN
  Administered 2022-09-14: 25 ug via INTRAVENOUS
  Administered 2022-09-14: 100 ug via INTRAVENOUS

## 2022-09-14 MED ORDER — DEXAMETHASONE SODIUM PHOSPHATE 10 MG/ML IJ SOLN
INTRAMUSCULAR | Status: DC | PRN
  Administered 2022-09-14: 8 mg via INTRAVENOUS

## 2022-09-14 MED ORDER — HYDROCODONE-ACETAMINOPHEN 5-325 MG PO TABS: 1.0000 | ORAL_TABLET | Freq: Four times a day (QID) | ORAL | 0 refills | Status: DC | PRN

## 2022-09-14 MED ORDER — ONDANSETRON HCL 4 MG/2ML IJ SOLN
INTRAMUSCULAR | Status: DC | PRN
  Administered 2022-09-14: 4 mg via INTRAVENOUS

## 2022-09-14 MED ORDER — CHLORHEXIDINE GLUCONATE CLOTH 2 % EX PADS: 6.0000 | MEDICATED_PAD | Freq: Once | CUTANEOUS | Status: DC

## 2022-09-14 MED ORDER — ONDANSETRON HCL 4 MG/2ML IJ SOLN
INTRAMUSCULAR | Status: AC
  Filled 2022-09-14: qty 2

## 2022-09-14 MED ORDER — PROPOFOL 10 MG/ML IV BOLUS
INTRAVENOUS | Status: AC
  Filled 2022-09-14: qty 20

## 2022-09-14 MED ORDER — LACTATED RINGERS IV SOLN: INTRAVENOUS | Status: DC

## 2022-09-14 MED ORDER — FENTANYL CITRATE PF 50 MCG/ML IJ SOSY
PREFILLED_SYRINGE | INTRAMUSCULAR | Status: AC
  Filled 2022-09-14: qty 1

## 2022-09-14 MED ORDER — HEMOSTATIC AGENTS (NO CHARGE) OPTIME
TOPICAL | Status: DC | PRN
  Administered 2022-09-14: 1

## 2022-09-14 MED ORDER — TRAMADOL HCL 50 MG PO TABS
50.0000 mg | ORAL_TABLET | Freq: Four times a day (QID) | ORAL | 0 refills | Status: DC | PRN
Start: 2022-09-14 — End: 2022-11-10

## 2022-09-14 MED ORDER — EPHEDRINE SULFATE-NACL 50-0.9 MG/10ML-% IV SOSY
PREFILLED_SYRINGE | INTRAVENOUS | Status: DC | PRN
  Administered 2022-09-14: 5 mg via INTRAVENOUS

## 2022-09-14 MED ORDER — ONDANSETRON HCL 4 MG/2ML IJ SOLN: 4.0000 mg | Freq: Once | INTRAMUSCULAR | Status: DC | PRN

## 2022-09-14 MED ORDER — LIDOCAINE 2% (20 MG/ML) 5 ML SYRINGE
INTRAMUSCULAR | Status: DC | PRN
  Administered 2022-09-14: 100 mg via INTRAVENOUS

## 2022-09-14 MED ORDER — ACETAMINOPHEN 10 MG/ML IV SOLN
INTRAVENOUS | Status: AC
  Filled 2022-09-14: qty 100

## 2022-09-14 SURGICAL SUPPLY — 36 items
ADH SKN CLS APL DERMABOND .7 (GAUZE/BANDAGES/DRESSINGS) ×1
APL PRP STRL LF DISP 70% ISPRP (MISCELLANEOUS) ×1
ATTRACTOMAT 16X20 MAGNETIC DRP (DRAPES) ×1 IMPLANT
BAG COUNTER SPONGE SURGICOUNT (BAG) ×1 IMPLANT
BAG SPNG CNTER NS LX DISP (BAG) ×1
BLADE SURG 15 STRL LF DISP TIS (BLADE) ×1 IMPLANT
BLADE SURG 15 STRL SS (BLADE) ×1
CHLORAPREP W/TINT 26 (MISCELLANEOUS) ×1 IMPLANT
CLIP TI MEDIUM 6 (CLIP) ×2 IMPLANT
CLIP TI WIDE RED SMALL 6 (CLIP) ×2 IMPLANT
COVER SURGICAL LIGHT HANDLE (MISCELLANEOUS) ×1 IMPLANT
DERMABOND ADVANCED .7 DNX12 (GAUZE/BANDAGES/DRESSINGS) ×1 IMPLANT
DRAPE LAPAROTOMY T 98X78 PEDS (DRAPES) ×1 IMPLANT
DRAPE UTILITY XL STRL (DRAPES) ×1 IMPLANT
ELECT PENCIL ROCKER SW 15FT (MISCELLANEOUS) IMPLANT
ELECT REM PT RETURN 15FT ADLT (MISCELLANEOUS) ×1 IMPLANT
GAUZE 4X4 16PLY ~~LOC~~+RFID DBL (SPONGE) ×1 IMPLANT
GLOVE SURG ORTHO 8.0 STRL STRW (GLOVE) ×1 IMPLANT
GOWN STRL REUS W/ TWL XL LVL3 (GOWN DISPOSABLE) ×3 IMPLANT
GOWN STRL REUS W/TWL XL LVL3 (GOWN DISPOSABLE) ×3
HEMOSTAT SURGICEL 2X4 FIBR (HEMOSTASIS) ×1 IMPLANT
ILLUMINATOR WAVEGUIDE N/F (MISCELLANEOUS) IMPLANT
KIT BASIN OR (CUSTOM PROCEDURE TRAY) ×1 IMPLANT
KIT TURNOVER KIT A (KITS) IMPLANT
NDL HYPO 22X1.5 SAFETY MO (MISCELLANEOUS) ×1 IMPLANT
NEEDLE HYPO 22X1.5 SAFETY MO (MISCELLANEOUS) ×1
PACK BASIC VI WITH GOWN DISP (CUSTOM PROCEDURE TRAY) ×1 IMPLANT
PENCIL SMOKE EVACUATOR (MISCELLANEOUS) ×1 IMPLANT
SHEARS HARMONIC 9CM CVD (BLADE) IMPLANT
SUT MNCRL AB 4-0 PS2 18 (SUTURE) ×1 IMPLANT
SUT VIC AB 3-0 SH 18 (SUTURE) ×1 IMPLANT
SYR BULB IRRIG 60ML STRL (SYRINGE) ×1 IMPLANT
SYR CONTROL 10ML LL (SYRINGE) ×1 IMPLANT
TOWEL OR 17X26 10 PK STRL BLUE (TOWEL DISPOSABLE) ×1 IMPLANT
TOWEL OR NON WOVEN STRL DISP B (DISPOSABLE) ×1 IMPLANT
TUBING CONNECTING 10 (TUBING) ×1 IMPLANT

## 2022-09-14 NOTE — Progress Notes (Signed)
Patient requested to verify the safety of Tramadol while taking Tikosyn. Pharmacy contacted, regarding the effect on the Qt prolongation. Pharmacy stated there is a slight possibility of effect on QT with Tramadol. Patient requested alternative pain management.

## 2022-09-14 NOTE — Discharge Instructions (Addendum)

## 2022-09-14 NOTE — Anesthesia Postprocedure Evaluation (Signed)
Anesthesia Post Note  Patient: Veronica Bradford  Procedure(s) Performed: LEFT PARATHYROIDECTOMY (Left)     Patient location during evaluation: PACU Anesthesia Type: General Level of consciousness: awake and alert Pain management: pain level controlled Vital Signs Assessment: post-procedure vital signs reviewed and stable Respiratory status: spontaneous breathing, nonlabored ventilation, respiratory function stable and patient connected to nasal cannula oxygen Cardiovascular status: blood pressure returned to baseline and stable Postop Assessment: no apparent nausea or vomiting Anesthetic complications: no   No notable events documented.  Last Vitals:  Vitals:   09/14/22 1200 09/14/22 1211  BP: (!) 147/69 (!) 150/86  Pulse: 80 81  Resp: 12   Temp:    SpO2: 98% 98%    Last Pain:  Vitals:   09/14/22 1220  TempSrc:   PainSc: 4                  Trevor Iha

## 2022-09-14 NOTE — Interval H&P Note (Signed)
History and Physical Interval Note:  09/14/2022 9:05 AM  Veronica Bradford  has presented today for surgery, with the diagnosis of PRIMARY  HYPERPARATHYROIDISM.  The various methods of treatment have been discussed with the patient and family. After consideration of risks, benefits and other options for treatment, the patient has consented to    Procedure(s): LEFT PARATHYROIDECTOMY (Left) as a surgical intervention.    The patient's history has been reviewed, patient examined, no change in status, stable for surgery.  I have reviewed the patient's chart and labs.  Questions were answered to the patient's satisfaction.    Darnell Level, MD [facility] practice Office: [phone]   Darnell Level

## 2022-09-14 NOTE — Transfer of Care (Signed)
Immediate Anesthesia Transfer of Care Note  Patient: Veronica Bradford  Procedure(s) Performed: LEFT PARATHYROIDECTOMY (Left)  Patient Location: PACU  Anesthesia Type:General  Level of Consciousness: awake, alert , and oriented  Airway & Oxygen Therapy: Patient Spontanous Breathing and Patient connected to face mask oxygen  Post-op Assessment: Report given to RN  Post vital signs: Reviewed and stable  Last Vitals:  Vitals Value Taken Time  BP    Temp    Pulse 102 09/14/22 1054  Resp 23 09/14/22 1054  SpO2 100 % 09/14/22 1054  Vitals shown include unfiled device data.  Last Pain:  Vitals:   09/14/22 0847  TempSrc: Oral  PainSc:          Complications: No notable events documented.

## 2022-09-14 NOTE — Op Note (Signed)
OPERATIVE REPORT - PARATHYROIDECTOMY  Preoperative diagnosis: Primary hyperparathyroidism  Postop diagnosis: Same  Procedure: Left minimally invasive parathyroidectomy  Surgeon:  Darnell Level, MD  Anesthesia: General endotracheal  Estimated blood loss: Minimal  Preparation: ChloraPrep  Indications: Patient returns to my practice having been seen previously for thyroid nodule and suspicion of primary hyperparathyroidism. It has been over 3 years since her last visit. Patient has been followed by her primary care physician, Dr. Porfirio Oar. Recent laboratory studies have shown an elevated serum calcium level up to 10.6. A intact PTH level has been as high as 192. 25-hydroxy vitamin D level was slightly low at 27. 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   Procedure: The patient was prepared in the pre-operative holding area. The patient was brought to the operating room and placed in a supine position on the operating room table. Following administration of general anesthesia, the patient was positioned and then prepped and draped in the usual strict aseptic fashion. After ascertaining that an adequate level of anesthesia been achieved, a neck incision was made with a #15 blade. Dissection was carried through subcutaneous tissues and platysma. Hemostasis was obtained with the electrocautery. Skin flaps were developed circumferentially and a Weitlander retractor was placed for exposure.  Strap muscles were incised in the midline. Strap muscles were reflected laterally exposing the thyroid lobe. With gentle blunt dissection the thyroid lobe was mobilized.  Dissection was carried posteriorly and an enlarged parathyroid gland was identified. It was gently mobilized. Vascular structures were divided between small ligaclips. Care was taken to avoid the recurrent laryngeal nerve. The parathyroid  gland was completely excised. It was submitted to pathology where frozen section confirmed hypercellular parathyroid tissue consistent with adenoma.  Neck was irrigated with warm saline and good hemostasis was noted. Fibrillar was placed in the operative field. Strap muscles were approximated in the midline with interrupted 3-0 Vicryl sutures. Platysma was closed with interrupted 3-0 Vicryl sutures. Marcaine was infiltrated circumferentially. Skin was closed with a running 4-0 Monocryl subcuticular suture. Wound was washed and dried and Dermabond was applied. Patient was awakened from anesthesia and brought to the recovery room. The patient tolerated the procedure well.   Darnell Level, MD [facility] Surgery Office: [phone]

## 2022-09-14 NOTE — Anesthesia Procedure Notes (Signed)
Procedure Name: Intubation Date/Time: 09/14/2022 9:50 AM  Performed by: Elisabeth Cara, CRNAPre-anesthesia Checklist: Patient identified, Emergency Drugs available, Suction available, Patient being monitored and Timeout performed Patient Re-evaluated:Patient Re-evaluated prior to induction Oxygen Delivery Method: Circle system utilized Preoxygenation: Pre-oxygenation with 100% oxygen Induction Type: IV induction Ventilation: Mask ventilation without difficulty Laryngoscope Size: Mac and 4 Grade View: Grade II Tube type: Oral Tube size: 7.5 mm Number of attempts: 1 Airway Equipment and Method: Stylet Placement Confirmation: ETT inserted through vocal cords under direct vision, positive ETCO2 and breath sounds checked- equal and bilateral Secured at: 21 cm Tube secured with: Tape Dental Injury: Teeth and Oropharynx as per pre-operative assessment

## 2022-09-14 NOTE — Progress Notes (Signed)
Device rep plans to arrive around 9 per [place] with [employer].

## 2022-09-15 ENCOUNTER — Encounter (HOSPITAL_COMMUNITY): Payer: Self-pay | Admitting: Surgery

## 2022-09-15 LAB — SURGICAL PATHOLOGY

## 2022-09-29 NOTE — Progress Notes (Signed)
Remote pacemaker transmission.   

## 2022-10-05 ENCOUNTER — Encounter: Payer: Self-pay | Admitting: Internal Medicine

## 2022-10-05 ENCOUNTER — Ambulatory Visit: Payer: Medicare Other | Attending: Internal Medicine | Admitting: Internal Medicine

## 2022-10-05 VITALS — BP 120/64 | HR 66 | Ht 67.5 in | Wt 253.0 lb

## 2022-10-05 DIAGNOSIS — I4510 Unspecified right bundle-branch block: Secondary | ICD-10-CM | POA: Diagnosis not present

## 2022-10-05 DIAGNOSIS — Z9889 Other specified postprocedural states: Secondary | ICD-10-CM | POA: Diagnosis not present

## 2022-10-05 DIAGNOSIS — Z9089 Acquired absence of other organs: Secondary | ICD-10-CM | POA: Diagnosis not present

## 2022-10-05 DIAGNOSIS — I493 Ventricular premature depolarization: Secondary | ICD-10-CM

## 2022-10-05 DIAGNOSIS — D649 Anemia, unspecified: Secondary | ICD-10-CM | POA: Diagnosis not present

## 2022-10-05 DIAGNOSIS — I4891 Unspecified atrial fibrillation: Secondary | ICD-10-CM

## 2022-10-05 DIAGNOSIS — I4892 Unspecified atrial flutter: Secondary | ICD-10-CM

## 2022-10-05 DIAGNOSIS — R001 Bradycardia, unspecified: Secondary | ICD-10-CM | POA: Diagnosis not present

## 2022-10-05 DIAGNOSIS — I872 Venous insufficiency (chronic) (peripheral): Secondary | ICD-10-CM | POA: Diagnosis not present

## 2022-10-05 DIAGNOSIS — B351 Tinea unguium: Secondary | ICD-10-CM | POA: Diagnosis not present

## 2022-10-05 DIAGNOSIS — D2272 Melanocytic nevi of left lower limb, including hip: Secondary | ICD-10-CM | POA: Diagnosis not present

## 2022-10-05 DIAGNOSIS — Z950 Presence of cardiac pacemaker: Secondary | ICD-10-CM

## 2022-10-05 DIAGNOSIS — E210 Primary hyperparathyroidism: Secondary | ICD-10-CM | POA: Diagnosis not present

## 2022-10-05 NOTE — Progress Notes (Signed)
Patient ID: Veronica Bradford, female   DOB: 08-Jul-1945, 77 y.o.   MRN: [id number]      Patient Care Team: Wanda Plump, MD as PCP - General (Internal Medicine) Duke Salvia, MD as PCP - Cardiology (Cardiology) Duke Salvia, MD as PCP - Electrophysiology (Cardiology) Duke Salvia, MD as Consulting Physician (Cardiology) Bernette Redbird, MD as Consulting Physician (Gastroenterology) [place], Heather, OD as Consulting Physician (Optometry) Stephannie Li, MD as Consulting Physician (Ophthalmology) Dominica Severin, MD as Consulting Physician (Orthopedic Surgery) Darnell Level, MD as Consulting Physician (General Surgery)   HPI  Veronica Bradford is a 77 y.o. 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  She does not need to get cardioverted however.  Atrial fibrillation seems to be associated in some way with her IBS which is associated with diarrhea.  She has treated sleep apnea with good reports.   Anticoagulation with Rivaroxaban; no bleeding   Admitted 1/24 for initiation of Tikosyn has had intercurrent atrial fibrillation of which she is unaware.  Recurring hypokalemia   Interval surgical therapy for primary hyperparathyroidism with a primary clinical complaint of fatigue; no better.  She is also noted that her hemoglobin A1c is low despite having modestly elevated blood sugars on fasting.  This is been associated with "rapid turnover "  Palpitations been largely quiescent.  She has need of antifungal treatment for her toenails?  Whether the antifungals will absorb enough to interfere with dofetilide   Date Cr Hgb  K Mag  5/18 0.66 14.7 3.7 2.1  12/21 0.59 13.3 3.9 2.2 6  4/22 0.58 13.7 3.6   9/22 0.71     11/22 3.5 10.2    7/23 0.62  11.6 3.9   1/24 0.67 15.2 4.1   9/24 0.8 11.7  4.0      DATE TEST EF   9/15 Myoview  65 % No ischemia  8/17 Echo   60-65 %   8/21 Myoview 71% No ischemia  8/21 Echo  60-65% LAE-mod-severe  (42.5 ml/m2)  2/23 Echo  65-70%   5/24 CT chest  Aortic calcification   7/24 CTA  CaScore 128          Past Medical History:  Diagnosis Date   Atrial fibrillation (HCC)    Atrial fibrillation (HCC)    Back pain    CHF (congestive heart failure) (HCC)    grade 2 HF   Chronic sinusitis    s/p 2 surgeries remotely, Dr Ezzard Standing   Closed fracture of metatarsal of left foot    L foot, fifth metatarsal   Complication of anesthesia    woke up once during procedure   Dysrhythmia    a-fib   Ectopic pregnancy    Endometrial cancer (HCC) 2004   Early diagnosis   GERD (gastroesophageal reflux disease)    Hyperlipemia    Hypertension    IBS (irritable bowel syndrome) 08/13/2014   Dx 2015, diarrhea on-off, Dr Matthias Hughs    Lumbar radiculopathy 08/2012   MRI in 08/2012   Mild hyperparathyroidism (HCC)    Osteopenia    PAF (paroxysmal atrial fibrillation) (HCC)    Presence of permanent cardiac pacemaker    Sciatica of right side 08/2013   Received Depomedrol 80 mg injection   Sleep  apnea 09/09/2013   Dx with OSA in 2015, by Dr. Graciela Husbands,   wears CPAPAPAP    Past Surgical History:  Procedure Laterality Date   ATRIAL FIBRILLATION ABLATION N/A 09/04/2017   Procedure: ATRIAL FIBRILLATION ABLATION;  Surgeon: Hillis Range, MD;  Location: [facility];  Service: Cardiovascular;  Laterality: N/A;   BIOPSY  03/11/2020   Procedure: BIOPSY;  Surgeon: Bernette Redbird, MD;  Location: [facility];  Service: Endoscopy;;  EGD and COLON   CARPAL TUNNEL RELEASE Right 06/17/2019   CARPAL TUNNEL RELEASE Left 11/2020   CHOLECYSTECTOMY  ~2006   COLONOSCOPY WITH PROPOFOL N/A 03/11/2020   Procedure: COLONOSCOPY WITH PROPOFOL;  Surgeon: Bernette Redbird, MD;  Location: [facility];  Service: Endoscopy;   Laterality: N/A;   DILATION AND CURETTAGE OF UTERUS     after SAB   ESOPHAGOGASTRODUODENOSCOPY  02/09/2014   Dr. Matthias Hughs   ESOPHAGOGASTRODUODENOSCOPY (EGD) WITH PROPOFOL N/A 03/11/2020   Procedure: ESOPHAGOGASTRODUODENOSCOPY (EGD) WITH PROPOFOL;  Surgeon: Bernette Redbird, MD;  Location: [facility];  Service: Endoscopy;  Laterality: N/A;   FINGER SURGERY Left    index   NASAL SINUS SURGERY     x 2 remotely, Dr Ezzard Standing   PACEMAKER IMPLANT N/A 09/13/2020   Procedure: PACEMAKER IMPLANT;  Surgeon: Duke Salvia, MD;  Location: [facility] INVASIVE CV LAB;  Service: Cardiovascular;  Laterality: N/A;   PARATHYROIDECTOMY Left 09/14/2022   Procedure: LEFT PARATHYROIDECTOMY;  Surgeon: Darnell Level, MD;  Location: [facility];  Service: General;  Laterality: Left;   TONSILLECTOMY     TOTAL ABDOMINAL HYSTERECTOMY W/ BILATERAL SALPINGOOPHORECTOMY     TOTAL HIP ARTHROPLASTY Left 07/18/2021   Procedure: TOTAL HIP ARTHROPLASTY ANTERIOR APPROACH;  Surgeon: Samson Frederic, MD;  Location: [facility];  Service: Orthopedics;  Laterality: Left;   TOTAL KNEE ARTHROPLASTY Right ~2008    Current Outpatient Medications  Medication Sig Dispense Refill   acetaminophen (TYLENOL) 500 MG tablet Take 500 mg by mouth every 6 (six) hours as needed for moderate pain or headache.     acetaminophen-codeine (TYLENOL #3) 300-30 MG tablet TAKE 1 TABLET BY MOUTH AT BEDTIME AS NEEDED FOR MODERATE PAIN 30 tablet 0   ARTIFICIAL TEAR SOLUTION OP Place 1 drop into both eyes 4 (four) times daily as needed (dry/irritated eyes.).     cephALEXin (KEFLEX) 500 MG capsule Take 4 capsules (2,000 mg total) by mouth once as needed for up to 1 dose (30-60 minutes prior to dental procedures). 4 capsule 0   Cholecalciferol (VITAMIN D) 50 MCG (2000 UT) tablet Take 2,000 Units by mouth in the morning.     Chromium Picolinate 500 MCG TABS Take 500 mcg by mouth daily after supper.     Coenzyme Q10 (CO Q 10) 100 MG CAPS Take 100 mg by mouth daily.     colestipol  (COLESTID) 1 g tablet Take 1 g by mouth 2 (two) times daily as needed.     CRANBERRY EXTRACT PO Take 2 tablets by mouth at bedtime.     diltiazem (CARDIZEM) 30 MG tablet Take 1 tablet every 4 hours AS NEEDED for heart rate >100 as long as blood pressure >100. (Patient taking differently: Take 30 mg by mouth every 4 (four) hours. Take 1 tablet every 4 hours AS NEEDED for heart rate >100 as long as blood pressure >100. Take every 4 hours until next daily dose.) 45 tablet 1   dofetilide (TIKOSYN) 250 MCG capsule Take 1 capsule (250 mcg total) by mouth 2 (two) times daily. 180 capsule  1   estradiol (ESTRACE) 0.1 MG/GM vaginal cream Place 1 Applicatorful vaginally 3 (three) times a week. 42.5 g 12   Ferrous Sulfate (IRON PO) Take 1 tablet by mouth daily.     fluticasone (FLONASE) 50 MCG/ACT nasal spray Place 1 spray into both nostrils daily.     folic acid (FOLVITE) 800 MCG tablet Take 800 mcg by mouth in the morning.     furosemide (LASIX) 40 MG tablet Take 1 tablet (40 mg total) by mouth as needed. 30 tablet 11   HYDROcodone-acetaminophen (NORCO) 5-325 MG tablet Take 1-2 tablets by mouth every 6 (six) hours as needed for moderate pain. 12 tablet 0   MAGNESIUM GLYCINATE PO Take 240 mg by mouth daily.     olopatadine (PATANOL) 0.1 % ophthalmic solution Place 1 drop into both eyes daily as needed (allergies).     pantoprazole (PROTONIX) 40 MG tablet Take 1 tablet (40 mg total) by mouth 2 (two) times daily.     potassium chloride SA (KLOR-CON M) 20 MEQ tablet Take 1 tablet (20 mEq total) by mouth daily. 30 tablet 6   pravastatin (PRAVACHOL) 40 MG tablet Take 1 tablet (40 mg total) by mouth at bedtime. (Patient taking differently: Take 10 mg by mouth at bedtime.) 90 tablet 1   rivaroxaban (XARELTO) 20 MG TABS tablet Take 1 tablet (20 mg total) by mouth daily with supper. 90 tablet 2   spironolactone (ALDACTONE) 25 MG tablet TAKE 1 TABLET(25 MG) BY MOUTH DAILY 90 tablet 3   sucralfate (CARAFATE) 1 g tablet  Take 1 g by mouth 2 (two) times daily.     valsartan (DIOVAN) 40 MG tablet Take 1 tablet (40 mg total) by mouth daily. (Patient taking differently: Take 10 mg by mouth daily.) 90 tablet 3   Vitamin D, Ergocalciferol, (DRISDOL) 1.25 MG (50000 UNIT) CAPS capsule Take 1 capsule (50,000 Units total) by mouth every 7 (seven) days. 12 capsule 0   metoprolol tartrate (LOPRESSOR) 50 MG tablet Take 1 tablet (50 mg total) by mouth once for 1 dose. Take 2 hours prior to your CT. (Patient not taking: Reported on 07/24/2022) 1 tablet 0   traMADol (ULTRAM) 50 MG tablet Take 1 tablet (50 mg total) by mouth every 6 (six) hours as needed for moderate pain. 12 tablet 0   No current facility-administered medications for this visit.    Allergies  Allergen Reactions   Ibuprofen Other (See Comments)    Pt is on Xarelto   Lactose Intolerance (Gi) Diarrhea and Nausea And Vomiting   Lisinopril Cough   Nsaids Other (See Comments)    Pt on Xarelto   Tape     Causes blisters    Tolmetin Other (See Comments)    Pt on Xarelto   Zoster Vaccine Live Other (See Comments)    Serum sickness, high fever, neck pain    Amoxicillin Rash   Penicillins Rash    Local reaction    Review of Systems negative except from HPI and PMH (+) Shortness of Breath (+) Lightheadedness (+) LE Edema  Physical Exam: BP 120/64 (BP Location: Right Arm, Patient Position: Sitting, Cuff Size: Large)   Pulse 66   Ht 5' 7.5" (1.715 m)   Wt 253 lb (114.8 kg)   SpO2 98%   BMI 39.04 kg/m    Well developed and well nourished in no acute distress HENT normal Neck supple with JVP-flat Clear Device pocket well healed; without hematoma or erythema.  There is no tethering  Regular rate and rhythm, no  gallop No  murmur Abd-soft with active BS No Clubbing cyanosis  edema Skin-warm and dry A & Oriented  Grossly normal sensory and motor function    Device function is normal. Programming changes none  See Paceart for details      Assessment and  Plan  Atrial fibrillation-flutter persistent  CHADS-VASc score 2 (age/gender)    Obesity  Dyspnea on exertion/HFpEF  RBBB/left axis deviation  PVCs  Sinus bradycardia/junctional rhythm  No interval atrial fibrillation of significance, a little bit perioperatively.  Continue anticoagulation with her rivaroxaban  PVCs are quiescient.  Dyspnea on exertion consistent with HFpEF.  E/E' 2/23 was 16.  Will increase her diuretic for couple of days and see if that makes a difference.  I have asked her to search out whether she be willing to take an SGLT2.  She will let us know.  She is mildly anemic.  She has had hemoglobins as high as 15 currently 11.  She is reported that low hemoglobin A1c's can be seen in the context of "rapid red cell turnover "this would be consistent with hemolysis.  Will check reticulocyte count and iron stores  Dyspnea on exertion.  Suspect HFpEF

## 2022-10-05 NOTE — Patient Instructions (Signed)
Medication Instructions:  Your physician has recommended you make the following change in your medication:   ** Increase Furosemide 40mg  twice daily x 3 days  *If you need a refill on your cardiac medications before your next appointment, please call your pharmacy*   Lab Work: CBC, Iron, Fe, TIBC and Retic count today  If you have labs (blood work) drawn today and your tests are completely normal, you will receive your results only by: MyChart Message (if you have MyChart) OR A paper copy in the mail If you have any lab test that is abnormal or we need to change your treatment, we will call you to review the results.   Testing/Procedures: None ordered.    Follow-Up: At [facility], you and your health needs are our priority.  As part of our continuing mission to provide you with exceptional heart care, we have created designated Provider Care Teams.  These Care Teams include your primary Cardiologist (physician) and Advanced Practice Providers (APPs -  Physician Assistants and Nurse Practitioners) who all work together to provide you with the care you need, when you need it.  We recommend signing up for the patient portal called "MyChart".  Sign up information is provided on this After Visit Summary.  MyChart is used to connect with patients for Virtual Visits (Telemedicine).  Patients are able to view lab/test results, encounter notes, upcoming appointments, etc.  Non-urgent messages can be sent to your provider as well.   To learn more about what you can do with MyChart, go to [email or web address].    Your next appointment:   6 months with Dr Graciela Husbands

## 2022-10-06 DIAGNOSIS — M7062 Trochanteric bursitis, left hip: Secondary | ICD-10-CM | POA: Diagnosis not present

## 2022-10-06 DIAGNOSIS — Z96642 Presence of left artificial hip joint: Secondary | ICD-10-CM | POA: Diagnosis not present

## 2022-10-06 DIAGNOSIS — M1712 Unilateral primary osteoarthritis, left knee: Secondary | ICD-10-CM | POA: Diagnosis not present

## 2022-10-06 LAB — CBC
Hematocrit: 40.4 % (ref 34.0–46.6)
Hemoglobin: 12.8 g/dL (ref 11.1–15.9)
MCH: 29 pg (ref 26.6–33.0)
MCHC: 31.7 g/dL (ref 31.5–35.7)
MCV: 92 fL (ref 79–97)
Platelets: 256 10*3/uL (ref 150–450)
RBC: 4.41 x10E6/uL (ref 3.77–5.28)
RDW: 12.5 % (ref 11.7–15.4)
WBC: 7.6 10*3/uL (ref 3.4–10.8)

## 2022-10-06 LAB — IRON,TIBC AND FERRITIN PANEL
Ferritin: 25 ng/mL (ref 15–150)
Iron Saturation: 5 % — CL (ref 15–55)
Iron: 17 ug/dL — ABNORMAL LOW (ref 27–139)
Total Iron Binding Capacity: 374 ug/dL (ref 250–450)
UIBC: 357 ug/dL (ref 118–369)

## 2022-10-06 LAB — RETICULOCYTES: Retic Ct Pct: 2.3 % (ref 0.6–2.6)

## 2022-10-11 ENCOUNTER — Telehealth: Payer: Self-pay | Admitting: Internal Medicine

## 2022-10-11 MED ORDER — FERROUS FUMARATE 325 (106 FE) MG PO TABS: 1.0000 | ORAL_TABLET | Freq: Two times a day (BID) | ORAL | 0 refills | Status: DC

## 2022-10-11 NOTE — Telephone Encounter (Signed)
LMOM asking for call back. Mychart message also sent to Pt, Rx sent.

## 2022-10-11 NOTE — Telephone Encounter (Signed)
Iron and ferritin recently check at cardiology, iron remains low. Please call patient. Recommend to increase iron to 325 mg BID, send the prescription if needed. She takes pantoprazole consequently recommend to take iron away from it: 30 minutes before lunch and 30 minutes before dinner. If she has any new or severe stomach problems schedule a visit.

## 2022-10-19 DIAGNOSIS — I83892 Varicose veins of left lower extremities with other complications: Secondary | ICD-10-CM | POA: Diagnosis not present

## 2022-10-27 NOTE — Therapy (Signed)
OUTPATIENT PHYSICAL THERAPY LOWER EXTREMITY EVALUATION   Patient Name: Veronica Bradford MRN: [id number] DOB:August 10, 1945, 77 y.o., female Today's Date: 10/31/2022  END OF SESSION:  PT End of Session - 10/31/22 1232     Visit Number 1    Date for PT Re-Evaluation 01/23/23    PT Start Time 1230    PT Stop Time 1325    PT Time Calculation (min) 55 min    Activity Tolerance Patient tolerated treatment well    Behavior During Therapy WFL for tasks assessed/performed             Past Medical History:  Diagnosis Date   Atrial fibrillation (HCC)    Atrial fibrillation (HCC)    Back pain    CHF (congestive heart failure) (HCC)    grade 2 HF   Chronic sinusitis    s/p 2 surgeries remotely, Dr Ezzard Standing   Closed fracture of metatarsal of left foot    L foot, fifth metatarsal   Complication of anesthesia    woke up once during procedure   Dysrhythmia    a-fib   Ectopic pregnancy    Endometrial cancer (HCC) 2004   Early diagnosis   GERD (gastroesophageal reflux disease)    Hyperlipemia    Hypertension    IBS (irritable bowel syndrome) 08/13/2014   Dx 2015, diarrhea on-off, Dr Matthias Hughs    Lumbar radiculopathy 08/2012   MRI in 08/2012   Mild hyperparathyroidism (HCC)    Osteopenia    PAF (paroxysmal atrial fibrillation) (HCC)    Presence of permanent cardiac pacemaker    Sciatica of right side 08/2013   Received Depomedrol 80 mg injection   Sleep apnea 09/09/2013   Dx with OSA in 2015, by Dr. Graciela Husbands,   wears CPAPAPAP   Past Surgical History:  Procedure Laterality Date   ATRIAL FIBRILLATION ABLATION N/A 09/04/2017   Procedure: ATRIAL FIBRILLATION ABLATION;  Surgeon: Hillis Range, MD;  Location: [facility];  Service: Cardiovascular;  Laterality: N/A;   BIOPSY  03/11/2020   Procedure: BIOPSY;  Surgeon: Bernette Redbird, MD;  Location: [facility];  Service: Endoscopy;;  EGD and COLON   CARPAL TUNNEL RELEASE Right 06/17/2019   CARPAL TUNNEL RELEASE Left 11/2020    CHOLECYSTECTOMY  ~2006   COLONOSCOPY WITH PROPOFOL N/A 03/11/2020   Procedure: COLONOSCOPY WITH PROPOFOL;  Surgeon: Bernette Redbird, MD;  Location: [facility];  Service: Endoscopy;  Laterality: N/A;   DILATION AND CURETTAGE OF UTERUS     after SAB   ESOPHAGOGASTRODUODENOSCOPY  02/09/2014   Dr. Matthias Hughs   ESOPHAGOGASTRODUODENOSCOPY (EGD) WITH PROPOFOL N/A 03/11/2020   Procedure: ESOPHAGOGASTRODUODENOSCOPY (EGD) WITH PROPOFOL;  Surgeon: Bernette Redbird, MD;  Location: [facility];  Service: Endoscopy;  Laterality: N/A;   FINGER SURGERY Left    index   NASAL SINUS SURGERY     x 2 remotely, Dr Ezzard Standing   PACEMAKER IMPLANT N/A 09/13/2020   Procedure: PACEMAKER IMPLANT;  Surgeon: Duke Salvia, MD;  Location: [facility] INVASIVE CV LAB;  Service: Cardiovascular;  Laterality: N/A;   PARATHYROIDECTOMY Left 09/14/2022   Procedure: LEFT PARATHYROIDECTOMY;  Surgeon: Darnell Level, MD;  Location: [facility];  Service: General;  Laterality: Left;   TONSILLECTOMY     TOTAL ABDOMINAL HYSTERECTOMY W/ BILATERAL SALPINGOOPHORECTOMY     TOTAL HIP ARTHROPLASTY Left 07/18/2021   Procedure: TOTAL HIP ARTHROPLASTY ANTERIOR APPROACH;  Surgeon: Samson Frederic, MD;  Location: [facility];  Service: Orthopedics;  Laterality: Left;   TOTAL KNEE ARTHROPLASTY Right ~2008   Patient Active  Problem List   Diagnosis Date Noted   Hyperparathyroidism, primary (HCC) 09/03/2022   DOE (dyspnea on exertion) 05/04/2022   Paroxysmal A-fib (HCC) 01/03/2022   Atrial fibrillation and flutter (HCC) - Persistent 08/16/2021   Closed left hip fracture, initial encounter (HCC) 07/14/2021   Chronic diastolic CHF (congestive heart failure) (HCC) 07/14/2021   Pacemaker 12/14/2020   RBBB 08/09/2020   PVC's (premature ventricular contractions) 08/09/2020   Sinus bradycardia - junctional rhythm 08/09/2020   Carpal tunnel syndrome, right 07/08/2019   Vitamin D deficiency 01/20/2019   Macular pucker, right eye 11/18/2018   Multiple pulmonary nodules  determined by computed tomography of lung 12/06/2017   Paroxysmal atrial fibrillation (HCC) 09/04/2017   Osteopenia 05/23/2017   Annual physical exam 04/14/2015   PCP NOTES >>>>> 12/23/2014   IBS (irritable bowel syndrome) 08/13/2014   Hyperglycemia    Chronic sinusitis    Endometrial cancer (HCC)    Back pain-- on tylenol #3 prn    GERD (gastroesophageal reflux disease)    HTN (hypertension) 09/09/2013   Diastolic dysfunction-grade 2 09/09/2013   Chronic anticoagulation 09/09/2013   Edema of both legs 09/09/2013   Dyslipidemia 09/09/2013   Obesity (BMI 30-39.9) 09/09/2013   OSA on CPAP 09/09/2013    PCP: Willow Ora  REFERRING PROVIDER: Trudee Grip  REFERRING DIAG: Gait disturbances and LE weakness  THERAPY DIAG:  Other abnormalities of gait and mobility  Muscle weakness (generalized)  Balance disorder  Fear of falling  Rationale for Evaluation and Treatment: Rehabilitation  ONSET DATE: 10/06/22  SUBJECTIVE:   SUBJECTIVE STATEMENT: The reason I am here is because I had a hip replacement a year ago in July. I never did rehab because the doctor I was with did not believe in it. I do not see him anymore. I saw a new doctor and  had some bursitis in my surgical hip and they gave me a shot.   PERTINENT HISTORY: THA- 07/18/21 TKA 2008 Pacemaker 2022 Trochanteric bursitis OA L knee  A fib CHF   PAIN:  Are you having pain? No  PRECAUTIONS: None  RED FLAGS: None   WEIGHT BEARING RESTRICTIONS: No  FALLS:  Has patient fallen in last 6 months? No  LIVING ENVIRONMENT: Lives with: lives with their family Lives in: House/apartment Stairs: Yes: Internal: 14 steps; can reach both   OCCUPATION: Retired Engineer, civil (consulting)   PLOF: Independent  PATIENT GOALS: to feel more stable    OBJECTIVE:  Note: Objective measures were completed at Evaluation unless otherwise noted.  DIAGNOSTIC FINDINGS: N/A    COGNITION: Overall cognitive status: Within functional limits for  tasks assessed     SENSATION: WFL   MUSCLE LENGTH: Hamstrings: tightness BLE  POSTURE: rounded shoulders and forward head   LOWER EXTREMITY ROM: some hip flexor limitation due to weakness   LOWER EXTREMITY MMT: hip flexor 3+/5 and 4/5 generally    FUNCTIONAL TESTS:  5 times sit to stand: 21.15s from chair  Timed up and go (TUG): 10.19s Berg Balance Scale: 48/56    TODAY'S TREATMENT:  DATE: EVAL 10/31/22    PATIENT EDUCATION:  Education details: POC and HEP Person educated: Patient Education method: Explanation Education comprehension: verbalized understanding  HOME EXERCISE PROGRAM: Access Code: [email or web address] URL: [email or web address] Date: 10/31/2022 Prepared by: Cassie Freer  Exercises - Standing Tandem Balance with Counter Support  - 1 x daily - 7 x weekly - 2 reps - 30 hold - Single Leg Stance with Support  - 1 x daily - 7 x weekly - 2 reps - 10 hold - Sit to Stand  - 1 x daily - 7 x weekly - 2 sets - 10 reps - Standing Marching  - 1 x daily - 7 x weekly - 2 sets - 10 reps  ASSESSMENT:  CLINICAL IMPRESSION: Patient is a 77 y.o. female who was seen today for physical therapy evaluation and treatment for gait and balance disturbances. She reports she had a hip surgery after suffering a fall last July, but did not get any rehab afterwards. As a result, she sates she has developed a fear of falling especially with stairs and uneven grounds. She does not have any pain in the hip but does have general joint aches and pains in knees and back. Patient does present with some hip flexor weakness and balance impairments found with BERG balance test. She will benefit from PT to work on stability and building her confidence to be able to get around the community without a fear of falling.   OBJECTIVE IMPAIRMENTS: Abnormal gait, decreased  activity tolerance, decreased balance, decreased coordination, difficulty walking, and decreased strength.   ACTIVITY LIMITATIONS: squatting, stairs, and locomotion level  REHAB POTENTIAL: Good  CLINICAL DECISION MAKING: Stable/uncomplicated  EVALUATION COMPLEXITY: Low  GOALS: Goals reviewed with patient? Yes  SHORT TERM GOALS: Target date: 12/12/22  Patient will be independent with initial HEP. Goal status: INITIAL  2.  Patient will demonstrate decreased 5xSTS from chair to <15s Baseline: 21.15s  Goal status: INITIAL   LONG TERM GOALS: Target date: 01/23/23  Patient will be independent with advanced/ongoing HEP to improve outcomes and carryover.  Goal status: INITIAL  2.  Patient will be able to do SLS for 5-10s.  Baseline: 2s  Goal status: INITIAL  3.  Patient will be able to navigate stairs with reciprocal alternating pattern.  Baseline: does 1 steps at a time  Goal status: INITIAL   4.  Patient will demonstrate improved hip flexor strength to 5/5 Baseline: 3+/5  Goal status: INITIAL  5.  Patient will be able to do full tandem stance for 15s or better. Baseline: unable to get in position Goal status: INITIAL  6.  Patient will score 53 on Berg Balance test to demonstrate lower risk of falls.  Baseline: 48 Goal status: INITIAL   PLAN:  PT FREQUENCY: 2x/week  PT DURATION: 12 weeks  PLANNED INTERVENTIONS: 97110-Therapeutic exercises, 97530- Therapeutic activity, 97112- Neuromuscular re-education, 97535- Self Care, [id number]- Manual therapy, [phone]- Gait training, Patient/Family education, Balance training, Stair training, Cryotherapy, and Moist heat  PLAN FOR NEXT SESSION: work on balance, hip flexor strengthening   Cassie Freer, PT 10/31/2022, 1:29 PM

## 2022-10-30 ENCOUNTER — Ambulatory Visit (INDEPENDENT_AMBULATORY_CARE_PROVIDER_SITE_OTHER): Payer: Medicare Other | Admitting: Medical

## 2022-10-30 ENCOUNTER — Encounter: Payer: Self-pay | Admitting: Medical

## 2022-10-30 VITALS — BP 127/48 | HR 81 | Temp 98.1°F | Wt 258.4 lb

## 2022-10-30 DIAGNOSIS — E559 Vitamin D deficiency, unspecified: Secondary | ICD-10-CM | POA: Diagnosis not present

## 2022-10-30 DIAGNOSIS — R300 Dysuria: Secondary | ICD-10-CM | POA: Diagnosis not present

## 2022-10-30 LAB — POC URINALSYSI DIPSTICK (AUTOMATED)
Bilirubin, UA: NEGATIVE
Blood, UA: NEGATIVE
Glucose, UA: NEGATIVE
Ketones, UA: NEGATIVE
Nitrite, UA: NEGATIVE
Protein, UA: NEGATIVE
Spec Grav, UA: <=1.005 — AB (ref 1.010–1.025)
Urobilinogen, UA: 0.2 U/dL
pH, UA: 7.5 (ref 5.0–8.0)

## 2022-10-30 LAB — VITAMIN D 25 HYDROXY (VIT D DEFICIENCY, FRACTURES): VITD: 47.97 ng/mL (ref 30.00–100.00)

## 2022-10-30 MED ORDER — CEPHALEXIN 500 MG PO CAPS: 500.0000 mg | ORAL_CAPSULE | Freq: Two times a day (BID) | ORAL | 0 refills | Status: DC

## 2022-10-30 MED ORDER — PHENAZOPYRIDINE HCL 200 MG PO TABS: 200.0000 mg | ORAL_TABLET | Freq: Three times a day (TID) | ORAL | 0 refills | Status: DC | PRN

## 2022-10-30 NOTE — Patient Instructions (Addendum)
Your appear to have likely urinary tract infection. I am prescribing keflex antibiotic for the probable infection. Hydrate well. I am sending out a urine culture. During the interim if your signs and symptoms worsen rather than improving please notify us. We will notify your when the culture results are back.  For vit d deficiency hx will get vit d level. Forward level to your pcp Dr. Drue Novel    Follow up in 7 days or as needed.

## 2022-10-30 NOTE — Progress Notes (Signed)
Subjective:    Patient ID: Veronica Bradford, female    DOB: [birth year], 77 y.o.   MRN: [id number]  HPI Pt had urinary symptoms over past 3 days. Pt had burning on urination and suprapubic pressure. Has been worsening over past 3 days.   Pt took at home urine test that showed leukocytes. She states also frequent urination. No blood in urine.  No fever, no chills or sweats.  In the past some cultures showed low growth greater than 10,000 and other did grow out pseudomnonas years ago in the past. Other cultures did not grow out bacteria. No comments/dx of interstitial cystitis.   Urogloist appt below 09-12-2021 Assessment/Plan:  1. UTI symptoms   UTI prevention measures were again discussed today including staying hydrated and avoiding constipation  Continuing Premarin cream as well as probiotics  Return to the clinic in 1 year for a refill for Premarin cream as well symptoms check, sooner as needed new issues or concerns    Review of Systems  Constitutional:  Negative for chills, fatigue and fever.  Respiratory:  Negative for chest tightness, shortness of breath and wheezing.   Cardiovascular:  Negative for chest pain and palpitations.  Gastrointestinal:  Negative for abdominal pain.  Genitourinary:  Positive for dysuria. Negative for frequency and hematuria.       Frequent urination.  Psychiatric/Behavioral:  Negative for dysphoric mood.     Past Medical History:  Diagnosis Date   Atrial fibrillation (HCC)    Atrial fibrillation (HCC)    Back pain    CHF (congestive heart failure) (HCC)    grade 2 HF   Chronic sinusitis    s/p 2 surgeries remotely, Dr Ezzard Standing   Closed fracture of metatarsal of left foot    L foot, fifth metatarsal   Complication of anesthesia    woke up once during procedure   Dysrhythmia    a-fib   Ectopic pregnancy    Endometrial cancer (HCC) 2004   Early diagnosis   GERD (gastroesophageal reflux disease)    Hyperlipemia    Hypertension    IBS  (irritable bowel syndrome) 08/13/2014   Dx 2015, diarrhea on-off, Dr Matthias Hughs    Lumbar radiculopathy 08/2012   MRI in 08/2012   Mild hyperparathyroidism (HCC)    Osteopenia    PAF (paroxysmal atrial fibrillation) (HCC)    Presence of permanent cardiac pacemaker    Sciatica of right side 08/2013   Received Depomedrol 80 mg injection   Sleep apnea 09/09/2013   Dx with OSA in 2015, by Dr. 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  Tobacco Use   Smoking status: Former    Current packs/day: 0.00    Types: Cigarettes    Quit date: 01/06/1980    Years since quitting: 42.8   Smokeless tobacco: Never   Tobacco comments:    smoked from 1970 to 1982, less than 1 ppd  Vaping Use   Vaping status: Never Used  Substance and Sexual Activity   Alcohol use: Yes    Comment: rare   Drug use: Never   Sexual activity: Not Currently    Birth control/protection: None  Other Topics Concern  Not on file  Social History Narrative   Lives w/ husband, and son Trinna Post   Retired Engineer, civil (consulting)   Lives in [place]   Social Determinants of Health   Financial Resource Strain: Low Risk  (10/29/2022)   Overall Financial Resource Strain (CARDIA)    Difficulty of Paying Living Expenses: Not hard at all  Food Insecurity: No Food Insecurity (10/29/2022)   Hunger Vital Sign    Worried About Running Out of Food in the Last Year: Never true    Ran Out of Food in the Last Year: Never true  Transportation Needs: No Transportation Needs (10/29/2022)   PRAPARE - Administrator, Civil Service (Medical): No    Lack of Transportation (Non-Medical): No  Physical Activity: Insufficiently Active (10/29/2022)   Exercise Vital Sign    Days  of Exercise per Week: 3 days    Minutes of Exercise per Session: 20 min  Stress: No Stress Concern Present (10/29/2022)   [employer] of Occupational Health - Occupational Stress Questionnaire    Feeling of Stress : Not at all  Social Connections: Moderately Integrated (10/29/2022)   Social Connection and Isolation Panel [NHANES]    Frequency of Communication with Friends and Family: More than three times a week    Frequency of Social Gatherings with Friends and Family: More than three times a week    Attends Religious Services: Never    Database administrator or Organizations: No    Attends Banker Meetings: 1 to 4 times per year    Marital Status: Married  Catering manager Violence: Not At Risk (01/03/2022)   Humiliation, Afraid, Rape, and Kick questionnaire    Fear of Current or Ex-Partner: No    Emotionally Abused: No    Physically Abused: No    Sexually Abused: No    Past Surgical History:  Procedure Laterality Date   ATRIAL FIBRILLATION ABLATION N/A 09/04/2017   Procedure: ATRIAL FIBRILLATION ABLATION;  Surgeon: Hillis Range, MD;  Location: [facility];  Service: Cardiovascular;  Laterality: N/A;   BIOPSY  03/11/2020   Procedure: BIOPSY;  Surgeon: Bernette Redbird, MD;  Location: [facility];  Service: Endoscopy;;  EGD and COLON   CARPAL TUNNEL RELEASE Right 06/17/2019   CARPAL TUNNEL RELEASE Left 11/2020   CHOLECYSTECTOMY  ~2006   COLONOSCOPY WITH PROPOFOL N/A 03/11/2020   Procedure: COLONOSCOPY WITH PROPOFOL;  Surgeon: Bernette Redbird, MD;  Location: [facility];  Service: Endoscopy;  Laterality: N/A;   DILATION AND CURETTAGE OF UTERUS     after SAB   ESOPHAGOGASTRODUODENOSCOPY  02/09/2014   Dr. Matthias Hughs   ESOPHAGOGASTRODUODENOSCOPY (EGD) WITH PROPOFOL N/A 03/11/2020   Procedure: ESOPHAGOGASTRODUODENOSCOPY (EGD) WITH PROPOFOL;  Surgeon: Bernette Redbird, MD;  Location: [facility];  Service: Endoscopy;  Laterality: N/A;   FINGER SURGERY Left     index   NASAL SINUS SURGERY     x 2 remotely, Dr Ezzard Standing   PACEMAKER IMPLANT N/A 09/13/2020   Procedure: PACEMAKER IMPLANT;  Surgeon: Duke Salvia, MD;  Location: [facility]VAS[facility] CV LAB;  Service: Cardiovascular;  Laterality: N/A;   PARATHYROIDECTOMY Left 09/14/2022   Procedure: LEFT PARATHYROIDECTOMY;  Surgeon: Darnell Level, MD;  Location: [facility];  Service: General;  Laterality: Left;   TONSILLECTOMY     TOTAL ABDOMINAL HYSTERECTOMY W/ BILATERAL SALPINGOOPHORECTOMY     TOTAL HIP ARTHROPLASTY Left 07/18/2021   Procedure: TOTAL HIP ARTHROPLASTY ANTERIOR APPROACH;  Surgeon: Samson Frederic, MD;  Location: [facility];  Service: Orthopedics;  Laterality: Left;  TOTAL KNEE ARTHROPLASTY Right ~2008    Family History  Problem Relation Age of Onset   Hypertension Mother    Colon cancer Mother    Breast cancer Mother    Ovarian cancer Mother    Hypertension Father    Diabetes Father    Head & neck cancer Sister    CAD Neg Hx     Allergies  Allergen Reactions   Ibuprofen Other (See Comments)    Pt is on Xarelto   Lactose Intolerance (Gi) Diarrhea and Nausea And Vomiting   Lisinopril Cough   Nsaids Other (See Comments)    Pt on Xarelto   Tape     Causes blisters    Tolmetin Other (See Comments)    Pt on Xarelto   Zoster Vaccine Live Other (See Comments)    Serum sickness, high fever, neck pain    Amoxicillin Rash   Penicillins Rash    Local reaction    Current Outpatient Medications on File Prior to Visit  Medication Sig Dispense Refill   acetaminophen (TYLENOL) 500 MG tablet Take 500 mg by mouth every 6 (six) hours as needed for moderate pain or headache.     acetaminophen-codeine (TYLENOL #3) 300-30 MG tablet TAKE 1 TABLET BY MOUTH AT BEDTIME AS NEEDED FOR MODERATE PAIN 30 tablet 0   ARTIFICIAL TEAR SOLUTION OP Place 1 drop into both eyes 4 (four) times daily as needed (dry/irritated eyes.).     cephALEXin (KEFLEX) 500 MG capsule Take 4 capsules (2,000 mg total) by mouth once as  needed for up to 1 dose (30-60 minutes prior to dental procedures). 4 capsule 0   Cholecalciferol (VITAMIN D) 50 MCG (2000 UT) tablet Take 2,000 Units by mouth in the morning.     Chromium Picolinate 500 MCG TABS Take 500 mcg by mouth daily after supper.     Coenzyme Q10 (CO Q 10) 100 MG CAPS Take 100 mg by mouth daily.     colestipol (COLESTID) 1 g tablet Take 1 g by mouth 2 (two) times daily as needed.     CRANBERRY EXTRACT PO Take 2 tablets by mouth at bedtime.     diltiazem (CARDIZEM) 30 MG tablet Take 1 tablet every 4 hours AS NEEDED for heart rate >100 as long as blood pressure >100. (Patient taking differently: Take 30 mg by mouth every 4 (four) hours. Take 1 tablet every 4 hours AS NEEDED for heart rate >100 as long as blood pressure >100. Take every 4 hours until next daily dose.) 45 tablet 1   dofetilide (TIKOSYN) 250 MCG capsule Take 1 capsule (250 mcg total) by mouth 2 (two) times daily. 180 capsule 1   estradiol (ESTRACE) 0.1 MG/GM vaginal cream Place 1 Applicatorful vaginally 3 (three) times a week. 42.5 g 12   ferrous fumarate (HEMOCYTE - 106 MG FE) 325 (106 Fe) MG TABS tablet Take 1 tablet (106 mg of iron total) by mouth 2 (two) times daily. Take apart from pantoprazole 180 tablet 0   fluticasone (FLONASE) 50 MCG/ACT nasal spray Place 1 spray into both nostrils daily.     folic acid (FOLVITE) 800 MCG tablet Take 800 mcg by mouth in the morning.     furosemide (LASIX) 40 MG tablet Take 1 tablet (40 mg total) by mouth as needed. 30 tablet 11   HYDROcodone-acetaminophen (NORCO) 5-325 MG tablet Take 1-2 tablets by mouth every 6 (six) hours as needed for moderate pain. 12 tablet 0   MAGNESIUM GLYCINATE PO Take 240  mg by mouth daily.     olopatadine (PATANOL) 0.1 % ophthalmic solution Place 1 drop into both eyes daily as needed (allergies).     pantoprazole (PROTONIX) 40 MG tablet Take 1 tablet (40 mg total) by mouth 2 (two) times daily.     potassium chloride SA (KLOR-CON M) 20 MEQ tablet  Take 1 tablet (20 mEq total) by mouth daily. 30 tablet 6   rivaroxaban (XARELTO) 20 MG TABS tablet Take 1 tablet (20 mg total) by mouth daily with supper. 90 tablet 2   spironolactone (ALDACTONE) 25 MG tablet TAKE 1 TABLET(25 MG) BY MOUTH DAILY 90 tablet 3   sucralfate (CARAFATE) 1 g tablet Take 1 g by mouth 2 (two) times daily.     valsartan (DIOVAN) 40 MG tablet Take 1 tablet (40 mg total) by mouth daily. (Patient taking differently: Take 10 mg by mouth daily.) 90 tablet 3   metoprolol tartrate (LOPRESSOR) 50 MG tablet Take 1 tablet (50 mg total) by mouth once for 1 dose. Take 2 hours prior to your CT. (Patient not taking: Reported on 07/24/2022) 1 tablet 0   traMADol (ULTRAM) 50 MG tablet Take 1 tablet (50 mg total) by mouth every 6 (six) hours as needed for moderate pain. 12 tablet 0   No current facility-administered medications on file prior to visit.    BP (!) 127/48   Pulse 81   Temp 98.1 F (36.7 C) (Oral)   Wt 258 lb 6 oz (117.2 kg)   SpO2 100%   BMI 39.87 kg/m        Objective:   Physical Exam  General Mental Status- Alert. General Appearance- Not in acute distress.   Skin General: Color- Normal Color. Moisture- Normal Moisture.  Neck Carotid Arteries- Normal color. Moisture- Normal Moisture. No carotid bruits. No JVD.  Chest and Lung Exam Auscultation: Breath Sounds:-Normal.  Cardiovascular Auscultation:Rythm- Regular. Murmurs & Other Heart Sounds:Auscultation of the heart reveals- No Murmurs.  Abdomen Inspection:-Inspeection Normal. Palpation/Percussion:Note:No mass. Palpation and Percussion of the abdomen reveal- Non Tender, suprapubic tenderness Non Distended + BS, no rebound or guarding.   Neurologic Cranial Nerve exam:- CN III-XII intact(No nystagmus), symmetric smile. Strength:- 5/5 equal and symmetric strength both upper and lower extremities.   Back- no cva tenderness.       Assessment & Plan:   Patient Instructions  Your appear to have  likely urinary tract infection. I am prescribing keflex antibiotic for the probable infection. Hydrate well. I am sending out a urine culture. During the interim if your signs and symptoms worsen rather than improving please notify us. We will notify your when the culture results are back.  For vit d deficiency hx will get vit d level. Forward level to your pcp Dr. Drue Novel    Follow up in 7 days or as needed.    Esperanza Richters, PA-C

## 2022-10-31 ENCOUNTER — Ambulatory Visit: Payer: Medicare Other | Attending: Orthopedic Surgery

## 2022-10-31 DIAGNOSIS — R2689 Other abnormalities of gait and mobility: Secondary | ICD-10-CM | POA: Diagnosis not present

## 2022-10-31 DIAGNOSIS — M6281 Muscle weakness (generalized): Secondary | ICD-10-CM | POA: Insufficient documentation

## 2022-10-31 DIAGNOSIS — F40298 Other specified phobia: Secondary | ICD-10-CM | POA: Diagnosis present

## 2022-11-01 ENCOUNTER — Encounter: Payer: Self-pay | Admitting: Medical

## 2022-11-01 LAB — URINE CULTURE
MICRO NUMBER:: 15651127
SPECIMEN QUALITY:: ADEQUATE

## 2022-11-02 ENCOUNTER — Encounter: Payer: Self-pay | Admitting: Physical Therapy

## 2022-11-02 ENCOUNTER — Ambulatory Visit: Payer: Medicare Other | Admitting: *Deleted

## 2022-11-02 ENCOUNTER — Ambulatory Visit: Payer: Medicare Other | Admitting: Physical Therapy

## 2022-11-02 ENCOUNTER — Encounter: Payer: Self-pay | Admitting: Medical

## 2022-11-02 DIAGNOSIS — M6281 Muscle weakness (generalized): Secondary | ICD-10-CM

## 2022-11-02 DIAGNOSIS — N390 Urinary tract infection, site not specified: Secondary | ICD-10-CM | POA: Diagnosis not present

## 2022-11-02 DIAGNOSIS — F40298 Other specified phobia: Secondary | ICD-10-CM

## 2022-11-02 DIAGNOSIS — R2689 Other abnormalities of gait and mobility: Secondary | ICD-10-CM

## 2022-11-02 MED ORDER — CEFTRIAXONE SODIUM 1 G IJ SOLR
1.0000 g | Freq: Once | INTRAMUSCULAR | Status: AC
Start: 2022-11-02 — End: 2022-11-02
  Administered 2022-11-02: 1 g via INTRAMUSCULAR

## 2022-11-02 NOTE — Telephone Encounter (Signed)
Pt called and lvm to return call to schedule NV  ?

## 2022-11-02 NOTE — Progress Notes (Signed)
Patient here for Rocephin 1gm per Esperanza Richters, PA-C.    Injection given in left vastus lateralis and patient tolerated well.

## 2022-11-02 NOTE — Therapy (Signed)
OUTPATIENT PHYSICAL THERAPY LOWER EXTREMITY TREATMENT     Patient Name: Veronica Bradford MRN: [id number] DOB:1945/06/15, 77 y.o., female Today's Date: 11/02/2022  END OF SESSION:  PT End of Session - 11/02/22 1631     Visit Number 2    Date for PT Re-Evaluation 01/23/23    PT Start Time 1602    PT Stop Time 1641    PT Time Calculation (min) 39 min    Activity Tolerance Patient tolerated treatment well    Behavior During Therapy WFL for tasks assessed/performed              Past Medical History:  Diagnosis Date   Atrial fibrillation (HCC)    Atrial fibrillation (HCC)    Back pain    CHF (congestive heart failure) (HCC)    grade 2 HF   Chronic sinusitis    s/p 2 surgeries remotely, Dr Ezzard Standing   Closed fracture of metatarsal of left foot    L foot, fifth metatarsal   Complication of anesthesia    woke up once during procedure   Dysrhythmia    a-fib   Ectopic pregnancy    Endometrial cancer (HCC) 2004   Early diagnosis   GERD (gastroesophageal reflux disease)    Hyperlipemia    Hypertension    IBS (irritable bowel syndrome) 08/13/2014   Dx 2015, diarrhea on-off, Dr Matthias Hughs    Lumbar radiculopathy 08/2012   MRI in 08/2012   Mild hyperparathyroidism (HCC)    Osteopenia    PAF (paroxysmal atrial fibrillation) (HCC)    Presence of permanent cardiac pacemaker    Sciatica of right side 08/2013   Received Depomedrol 80 mg injection   Sleep apnea 09/09/2013   Dx with OSA in 2015, by Dr. Graciela Husbands,   wears CPAPAPAP   Past Surgical History:  Procedure Laterality Date   ATRIAL FIBRILLATION ABLATION N/A 09/04/2017   Procedure: ATRIAL FIBRILLATION ABLATION;  Surgeon: Hillis Range, MD;  Location: [facility];  Service: Cardiovascular;  Laterality: N/A;   BIOPSY  03/11/2020   Procedure: BIOPSY;  Surgeon: Bernette Redbird, MD;  Location: [facility];  Service: Endoscopy;;  EGD and COLON   CARPAL TUNNEL RELEASE Right 06/17/2019   CARPAL TUNNEL RELEASE Left 11/2020    CHOLECYSTECTOMY  ~2006   COLONOSCOPY WITH PROPOFOL N/A 03/11/2020   Procedure: COLONOSCOPY WITH PROPOFOL;  Surgeon: Bernette Redbird, MD;  Location: [facility];  Service: Endoscopy;  Laterality: N/A;   DILATION AND CURETTAGE OF UTERUS     after SAB   ESOPHAGOGASTRODUODENOSCOPY  02/09/2014   Dr. Matthias Hughs   ESOPHAGOGASTRODUODENOSCOPY (EGD) WITH PROPOFOL N/A 03/11/2020   Procedure: ESOPHAGOGASTRODUODENOSCOPY (EGD) WITH PROPOFOL;  Surgeon: Bernette Redbird, MD;  Location: [facility];  Service: Endoscopy;  Laterality: N/A;   FINGER SURGERY Left    index   NASAL SINUS SURGERY     x 2 remotely, Dr Ezzard Standing   PACEMAKER IMPLANT N/A 09/13/2020   Procedure: PACEMAKER IMPLANT;  Surgeon: Duke Salvia, MD;  Location: [facility] INVASIVE CV LAB;  Service: Cardiovascular;  Laterality: N/A;   PARATHYROIDECTOMY Left 09/14/2022   Procedure: LEFT PARATHYROIDECTOMY;  Surgeon: Darnell Level, MD;  Location: [facility];  Service: General;  Laterality: Left;   TONSILLECTOMY     TOTAL ABDOMINAL HYSTERECTOMY W/ BILATERAL SALPINGOOPHORECTOMY     TOTAL HIP ARTHROPLASTY Left 07/18/2021   Procedure: TOTAL HIP ARTHROPLASTY ANTERIOR APPROACH;  Surgeon: Samson Frederic, MD;  Location: [facility];  Service: Orthopedics;  Laterality: Left;   TOTAL KNEE ARTHROPLASTY Right ~2008  Patient Active Problem List   Diagnosis Date Noted   Hyperparathyroidism, primary (HCC) 09/03/2022   DOE (dyspnea on exertion) 05/04/2022   Paroxysmal A-fib (HCC) 01/03/2022   Atrial fibrillation and flutter (HCC) - Persistent 08/16/2021   Closed left hip fracture, initial encounter (HCC) 07/14/2021   Chronic diastolic CHF (congestive heart failure) (HCC) 07/14/2021   Pacemaker 12/14/2020   RBBB 08/09/2020   PVC's (premature ventricular contractions) 08/09/2020   Sinus bradycardia - junctional rhythm 08/09/2020   Carpal tunnel syndrome, right 07/08/2019   Vitamin D deficiency 01/20/2019   Macular pucker, right eye 11/18/2018   Multiple pulmonary nodules  determined by computed tomography of lung 12/06/2017   Paroxysmal atrial fibrillation (HCC) 09/04/2017   Osteopenia 05/23/2017   Annual physical exam 04/14/2015   PCP NOTES >>>>> 12/23/2014   IBS (irritable bowel syndrome) 08/13/2014   Hyperglycemia    Chronic sinusitis    Endometrial cancer (HCC)    Back pain-- on tylenol #3 prn    GERD (gastroesophageal reflux disease)    HTN (hypertension) 09/09/2013   Diastolic dysfunction-grade 2 09/09/2013   Chronic anticoagulation 09/09/2013   Edema of both legs 09/09/2013   Dyslipidemia 09/09/2013   Obesity (BMI 30-39.9) 09/09/2013   OSA on CPAP 09/09/2013    PCP: Willow Ora  REFERRING PROVIDER: Trudee Grip  REFERRING DIAG: Gait disturbances and LE weakness  THERAPY DIAG:  Other abnormalities of gait and mobility  Muscle weakness (generalized)  Balance disorder  Fear of falling  Rationale for Evaluation and Treatment: Rehabilitation  ONSET DATE: 10/06/22  SUBJECTIVE:   SUBJECTIVE STATEMENT:  Doing OK, just got an injection in my thigh for UTI. Hip feels the same. My biggest issues are stability/balance, and getting the left leg stronger   PERTINENT HISTORY: THA- 07/18/21 TKA 2008 Pacemaker 2022 Trochanteric bursitis OA L knee  A fib CHF   PAIN:  Are you having pain? No 0/10  PRECAUTIONS: None  RED FLAGS: None   WEIGHT BEARING RESTRICTIONS: No  FALLS:  Has patient fallen in last 6 months? No  LIVING ENVIRONMENT: Lives with: lives with their family Lives in: House/apartment Stairs: Yes: Internal: 14 steps; can reach both   OCCUPATION: Retired Engineer, civil (consulting)   PLOF: Independent  PATIENT GOALS: to feel more stable    OBJECTIVE:  Note: Objective measures were completed at Evaluation unless otherwise noted.  DIAGNOSTIC FINDINGS: N/A    COGNITION: Overall cognitive status: Within functional limits for tasks assessed     SENSATION: WFL   MUSCLE LENGTH: Hamstrings: tightness BLE  POSTURE: rounded  shoulders and forward head   LOWER EXTREMITY ROM: some hip flexor limitation due to weakness   LOWER EXTREMITY MMT: hip flexor 3+/5 and 4/5 generally    FUNCTIONAL TESTS:  5 times sit to stand: 21.15s from chair  Timed up and go (TUG): 10.19s Berg Balance Scale: 48/56    TODAY'S TREATMENT:  DATE:   11/02/22  Nustep L4 x6 minutes BLEs only  Bridges with red TB x12  Supine clams red TB x12 Figure four stretch 3x30 seconds B L hip flexor lifts off edge of table 2x5  In // bars: Tandem stance 3x30 seconds B  Narrow BOS blue foam pad 3x30 seconds       EVAL 10/31/22    PATIENT EDUCATION:  Education details: POC and HEP Person educated: Patient Education method: Explanation Education comprehension: verbalized understanding  HOME EXERCISE PROGRAM: Access Code: [email or web address] URL: [email or web address] Date: 10/31/2022 Prepared by: Cassie Freer  Exercises - Standing Tandem Balance with Counter Support  - 1 x daily - 7 x weekly - 2 reps - 30 hold - Single Leg Stance with Support  - 1 x daily - 7 x weekly - 2 reps - 10 hold - Sit to Stand  - 1 x daily - 7 x weekly - 2 sets - 10 reps - Standing Marching  - 1 x daily - 7 x weekly - 2 sets - 10 reps  ASSESSMENT:  CLINICAL IMPRESSION:  Pt arrives today doing OK, we focused most of session on hip strength and flexibility today with balance work as time allowed. Motivated to improve, but very easily fatigued. Will continue to progress as able and tolerated.   OBJECTIVE IMPAIRMENTS: Abnormal gait, decreased activity tolerance, decreased balance, decreased coordination, difficulty walking, and decreased strength.   ACTIVITY LIMITATIONS: squatting, stairs, and locomotion level  REHAB POTENTIAL: Good  CLINICAL DECISION MAKING: Stable/uncomplicated  EVALUATION COMPLEXITY:  Low  GOALS: Goals reviewed with patient? Yes  SHORT TERM GOALS: Target date: 12/12/22  Patient will be independent with initial HEP. Goal status: INITIAL  2.  Patient will demonstrate decreased 5xSTS from chair to <15s Baseline: 21.15s  Goal status: INITIAL   LONG TERM GOALS: Target date: 01/23/23  Patient will be independent with advanced/ongoing HEP to improve outcomes and carryover.  Goal status: INITIAL  2.  Patient will be able to do SLS for 5-10s.  Baseline: 2s  Goal status: INITIAL  3.  Patient will be able to navigate stairs with reciprocal alternating pattern.  Baseline: does 1 steps at a time  Goal status: INITIAL   4.  Patient will demonstrate improved hip flexor strength to 5/5 Baseline: 3+/5  Goal status: INITIAL  5.  Patient will be able to do full tandem stance for 15s or better. Baseline: unable to get in position Goal status: INITIAL  6.  Patient will score 53 on Berg Balance test to demonstrate lower risk of falls.  Baseline: 48 Goal status: INITIAL   PLAN:  PT FREQUENCY: 2x/week  PT DURATION: 12 weeks  PLANNED INTERVENTIONS: 97110-Therapeutic exercises, 97530- Therapeutic activity, 97112- Neuromuscular re-education, 97535- Self Care, 16109- Manual therapy, [phone]- Gait training, Patient/Family education, Balance training, Stair training, Cryotherapy, and Moist heat  PLAN FOR NEXT SESSION: work on balance, hip flexor strengthening, regular HEP updates   Nedra Hai, PT, DPT 11/02/22 4:43 PM

## 2022-11-02 NOTE — Telephone Encounter (Signed)
Pt called back and wanted the print out of the labs sent to her email [email or web address] .Marland Kitchen Stated she still has symptoms after her antibiotics finish and she doesn't have urgency anymore. Pt wants to weight options before coming in for rocephin

## 2022-11-07 ENCOUNTER — Ambulatory Visit: Payer: Medicare Other

## 2022-11-08 NOTE — Therapy (Signed)
OUTPATIENT PHYSICAL THERAPY LOWER EXTREMITY TREATMENT     Patient Name: Veronica Bradford MRN: [id number] DOB:[birth year], 77 y.o., female Today's Date: 11/10/2022  END OF SESSION:  PT End of Session - 11/10/22 1028     Visit Number 3    Date for PT Re-Evaluation 01/23/23    PT Start Time 1028    PT Stop Time 1100    PT Time Calculation (min) 32 min    Activity Tolerance Patient tolerated treatment well    Behavior During Therapy WFL for tasks assessed/performed               Past Medical History:  Diagnosis Date   Atrial fibrillation (HCC)    Atrial fibrillation (HCC)    Back pain    CHF (congestive heart failure) (HCC)    grade 2 HF   Chronic sinusitis    s/p 2 surgeries remotely, Dr Ezzard Standing   Closed fracture of metatarsal of left foot    L foot, fifth metatarsal   Complication of anesthesia    woke up once during procedure   Dysrhythmia    a-fib   Ectopic pregnancy    Endometrial cancer (HCC) 2004   Early diagnosis   GERD (gastroesophageal reflux disease)    Hyperlipemia    Hypertension    IBS (irritable bowel syndrome) 08/13/2014   Dx 2015, diarrhea on-off, Dr Matthias Hughs    Lumbar radiculopathy 08/2012   MRI in 08/2012   Mild hyperparathyroidism (HCC)    Osteopenia    PAF (paroxysmal atrial fibrillation) (HCC)    Presence of permanent cardiac pacemaker    Sciatica of right side 08/2013   Received Depomedrol 80 mg injection   Sleep apnea 09/09/2013   Dx with OSA in 2015, by Dr. Graciela Husbands,   wears CPAPAPAP   Past Surgical History:  Procedure Laterality Date   ATRIAL FIBRILLATION ABLATION N/A 09/04/2017   Procedure: ATRIAL FIBRILLATION ABLATION;  Surgeon: Hillis Range, MD;  Location: [facility];  Service: Cardiovascular;  Laterality: N/A;   BIOPSY  03/11/2020   Procedure: BIOPSY;  Surgeon: Bernette Redbird, MD;  Location: [facility];  Service: Endoscopy;;  EGD and COLON   CARPAL TUNNEL RELEASE Right 06/17/2019   CARPAL TUNNEL RELEASE Left 11/2020    CHOLECYSTECTOMY  ~2006   COLONOSCOPY WITH PROPOFOL N/A 03/11/2020   Procedure: COLONOSCOPY WITH PROPOFOL;  Surgeon: Bernette Redbird, MD;  Location: [facility];  Service: Endoscopy;  Laterality: N/A;   DILATION AND CURETTAGE OF UTERUS     after SAB   ESOPHAGOGASTRODUODENOSCOPY  02/09/2014   Dr. Matthias Hughs   ESOPHAGOGASTRODUODENOSCOPY (EGD) WITH PROPOFOL N/A 03/11/2020   Procedure: ESOPHAGOGASTRODUODENOSCOPY (EGD) WITH PROPOFOL;  Surgeon: Bernette Redbird, MD;  Location: [facility];  Service: Endoscopy;  Laterality: N/A;   FINGER SURGERY Left    index   NASAL SINUS SURGERY     x 2 remotely, Dr Ezzard Standing   PACEMAKER IMPLANT N/A 09/13/2020   Procedure: PACEMAKER IMPLANT;  Surgeon: Duke Salvia, MD;  Location: [facility] CV [facility];  Service: Cardiovascular;  Laterality: N/A;   PARATHYROIDECTOMY Left 09/14/2022   Procedure: LEFT PARATHYROIDECTOMY;  Surgeon: Darnell Level, MD;  Location: [facility];  Service: General;  Laterality: Left;   TONSILLECTOMY     TOTAL ABDOMINAL HYSTERECTOMY W/ BILATERAL SALPINGOOPHORECTOMY     TOTAL HIP ARTHROPLASTY Left 07/18/2021   Procedure: TOTAL HIP ARTHROPLASTY ANTERIOR APPROACH;  Surgeon: Samson Frederic, MD;  Location: [facility];  Service: Orthopedics;  Laterality: Left;   TOTAL KNEE ARTHROPLASTY Right ~2008  Patient Active Problem List   Diagnosis Date Noted   Hyperparathyroidism, primary (HCC) 09/03/2022   DOE (dyspnea on exertion) 05/04/2022   Paroxysmal A-fib (HCC) 01/03/2022   Atrial fibrillation and flutter (HCC) - Persistent 08/16/2021   Closed left hip fracture, initial encounter (HCC) 07/14/2021   Chronic diastolic CHF (congestive heart failure) (HCC) 07/14/2021   Pacemaker 12/14/2020   RBBB 08/09/2020   PVC's (premature ventricular contractions) 08/09/2020   Sinus bradycardia - junctional rhythm 08/09/2020   Carpal tunnel syndrome, right 07/08/2019   Vitamin D deficiency 01/20/2019   Macular pucker, right eye 11/18/2018   Multiple pulmonary nodules  determined by computed tomography of lung 12/06/2017   Paroxysmal atrial fibrillation (HCC) 09/04/2017   Osteopenia 05/23/2017   Annual physical exam 04/14/2015   PCP NOTES >>>>> 12/23/2014   IBS (irritable bowel syndrome) 08/13/2014   Hyperglycemia    Chronic sinusitis    Endometrial cancer (HCC)    Back pain-- on tylenol #3 prn    GERD (gastroesophageal reflux disease)    HTN (hypertension) 09/09/2013   Diastolic dysfunction-grade 2 09/09/2013   Chronic anticoagulation 09/09/2013   Edema of both legs 09/09/2013   Dyslipidemia 09/09/2013   Obesity (BMI 30-39.9) 09/09/2013   OSA on CPAP 09/09/2013    PCP: Willow Ora  REFERRING PROVIDER: Trudee Grip  REFERRING DIAG: Gait disturbances and LE weakness  THERAPY DIAG:  Other abnormalities of gait and mobility  Muscle weakness (generalized)  Balance disorder  Fear of falling  Rationale for Evaluation and Treatment: Rehabilitation  ONSET DATE: 10/06/22  SUBJECTIVE:   SUBJECTIVE STATEMENT: Doing alright, no pain. Was broken after the last session, my sciatica kicked in and my R butt was hurting. That lasted about 3-4 days. It eventually got better.  PERTINENT HISTORY: THA- 07/18/21 TKA 2008 Pacemaker 2022 Trochanteric bursitis OA L knee  A fib CHF   PAIN:  Are you having pain? No 0/10  PRECAUTIONS: None  RED FLAGS: None   WEIGHT BEARING RESTRICTIONS: No  FALLS:  Has patient fallen in last 6 months? No  LIVING ENVIRONMENT: Lives with: lives with their family Lives in: House/apartment Stairs: Yes: Internal: 14 steps; can reach both   OCCUPATION: Retired Engineer, civil (consulting)   PLOF: Independent  PATIENT GOALS: to feel more stable    OBJECTIVE:  Note: Objective measures were completed at Evaluation unless otherwise noted.  DIAGNOSTIC FINDINGS: N/A    COGNITION: Overall cognitive status: Within functional limits for tasks assessed     SENSATION: WFL   MUSCLE LENGTH: Hamstrings: tightness BLE  POSTURE:  rounded shoulders and forward head   LOWER EXTREMITY ROM: some hip flexor limitation due to weakness   LOWER EXTREMITY MMT: hip flexor 3+/5 and 4/5 generally    FUNCTIONAL TESTS:  5 times sit to stand: 21.15s from chair  Timed up and go (TUG): 10.19s Berg Balance Scale: 48/56    TODAY'S TREATMENT:  DATE:  11/10/22 NuStep L5x42mins LE only  Leg ext 10# 2x10 HS curls 20# 2x10 STS x10, x10 on airex  Lateral band steps green x20 steps Seated hip abduction green x10, blue x10   11/02/22 Nustep L4 x6 minutes BLEs only  Bridges with red TB x12  Supine clams red TB x12 Figure four stretch 3x30 seconds B L hip flexor lifts off edge of table 2x5  In // bars: Tandem stance 3x30 seconds B  Narrow BOS blue foam pad 3x30 seconds    EVAL 10/31/22    PATIENT EDUCATION:  Education details: POC and HEP Person educated: Patient Education method: Explanation Education comprehension: verbalized understanding  HOME EXERCISE PROGRAM: Access Code: [email or web address] URL: [email or web address] Date: 10/31/2022 Prepared by: Cassie Freer  Exercises - Standing Tandem Balance with Counter Support  - 1 x daily - 7 x weekly - 2 reps - 30 hold - Single Leg Stance with Support  - 1 x daily - 7 x weekly - 2 reps - 10 hold - Sit to Stand  - 1 x daily - 7 x weekly - 2 sets - 10 reps - Standing Marching  - 1 x daily - 7 x weekly - 2 sets - 10 reps  ASSESSMENT:  CLINICAL IMPRESSION: Pt arrives today doing OK, a few minutes late as she had guests over and almost forgot. We focused most of session on hip strengthening.  Motivated to improve, but very easily fatigued and SOB especially after endurance warm up. Difficulty with STS on airex, some LOB and fatigue. Will continue to progress as able and tolerated.   OBJECTIVE IMPAIRMENTS: Abnormal gait, decreased activity  tolerance, decreased balance, decreased coordination, difficulty walking, and decreased strength.   ACTIVITY LIMITATIONS: squatting, stairs, and locomotion level  REHAB POTENTIAL: Good  CLINICAL DECISION MAKING: Stable/uncomplicated  EVALUATION COMPLEXITY: Low  GOALS: Goals reviewed with patient? Yes  SHORT TERM GOALS: Target date: 12/12/22  Patient will be independent with initial HEP. Goal status: INITIAL  2.  Patient will demonstrate decreased 5xSTS from chair to <15s Baseline: 21.15s  Goal status: INITIAL   LONG TERM GOALS: Target date: 01/23/23  Patient will be independent with advanced/ongoing HEP to improve outcomes and carryover.  Goal status: INITIAL  2.  Patient will be able to do SLS for 5-10s.  Baseline: 2s  Goal status: INITIAL  3.  Patient will be able to navigate stairs with reciprocal alternating pattern.  Baseline: does 1 steps at a time  Goal status: INITIAL   4.  Patient will demonstrate improved hip flexor strength to 5/5 Baseline: 3+/5  Goal status: INITIAL  5.  Patient will be able to do full tandem stance for 15s or better. Baseline: unable to get in position Goal status: INITIAL  6.  Patient will score 53 on Berg Balance test to demonstrate lower risk of falls.  Baseline: 48 Goal status: INITIAL   PLAN:  PT FREQUENCY: 2x/week  PT DURATION: 12 weeks  PLANNED INTERVENTIONS: 97110-Therapeutic exercises, 97530- Therapeutic activity, 97112- Neuromuscular re-education, 97535- Self Care, 62130- Manual therapy, [phone]- Gait training, Patient/Family education, Balance training, Stair training, Cryotherapy, and Moist heat  PLAN FOR NEXT SESSION: work on balance, hip flexor strengthening, regular HEP updates   Cassie Freer, PT, DPT 11/10/22 11:01 AM

## 2022-11-09 ENCOUNTER — Encounter: Payer: Self-pay | Admitting: Internal Medicine

## 2022-11-10 ENCOUNTER — Ambulatory Visit: Payer: Medicare Other | Attending: Orthopedic Surgery

## 2022-11-10 ENCOUNTER — Encounter: Payer: Self-pay | Admitting: Family Medicine

## 2022-11-10 ENCOUNTER — Telehealth: Payer: Self-pay | Admitting: Internal Medicine

## 2022-11-10 ENCOUNTER — Ambulatory Visit (INDEPENDENT_AMBULATORY_CARE_PROVIDER_SITE_OTHER): Payer: Medicare Other | Admitting: Family Medicine

## 2022-11-10 VITALS — BP 130/70 | HR 66 | Temp 98.7°F | Resp 18 | Ht 67.5 in | Wt 258.4 lb

## 2022-11-10 DIAGNOSIS — R300 Dysuria: Secondary | ICD-10-CM

## 2022-11-10 DIAGNOSIS — N952 Postmenopausal atrophic vaginitis: Secondary | ICD-10-CM

## 2022-11-10 DIAGNOSIS — R2689 Other abnormalities of gait and mobility: Secondary | ICD-10-CM | POA: Diagnosis not present

## 2022-11-10 DIAGNOSIS — N390 Urinary tract infection, site not specified: Secondary | ICD-10-CM | POA: Diagnosis not present

## 2022-11-10 DIAGNOSIS — M6281 Muscle weakness (generalized): Secondary | ICD-10-CM | POA: Diagnosis not present

## 2022-11-10 DIAGNOSIS — F40298 Other specified phobia: Secondary | ICD-10-CM | POA: Diagnosis present

## 2022-11-10 LAB — POC URINALSYSI DIPSTICK (AUTOMATED)
Bilirubin, UA: NEGATIVE
Blood, UA: NEGATIVE
Glucose, UA: NEGATIVE
Ketones, UA: NEGATIVE
Leukocytes, UA: NEGATIVE
Nitrite, UA: NEGATIVE
Protein, UA: NEGATIVE
Spec Grav, UA: <=1.005 — AB (ref 1.010–1.025)
Urobilinogen, UA: 0.2 U/dL
pH, UA: 7 (ref 5.0–8.0)

## 2022-11-10 MED ORDER — ESTRADIOL 0.1 MG/GM VA CREA
1.0000 | TOPICAL_CREAM | VAGINAL | 12 refills | Status: AC
Start: 2022-11-10 — End: ?

## 2022-11-10 MED ORDER — PHENAZOPYRIDINE HCL 200 MG PO TABS
200.0000 mg | ORAL_TABLET | Freq: Three times a day (TID) | ORAL | 0 refills | Status: DC | PRN
Start: 2022-11-10 — End: 2022-11-30

## 2022-11-10 NOTE — Patient Instructions (Signed)
Dysuria Dysuria is pain or discomfort during urination. The pain or discomfort may be felt in the part of the body that drains urine from the bladder (urethra) or in the surrounding tissue of the genitals. The pain may also be felt in the groin area, lower abdomen, or lower back. You may have to urinate frequently or have the sudden feeling that you have to urinate (urgency). Dysuria can affect anyone, but it is more common in females. Dysuria can be caused by many different things, including: Urinary tract infection. Kidney stones or bladder stones. Certain STIs (sexually transmitted infections), such as chlamydia. Dehydration. Inflammation of the tissues of the vagina. Use of certain medicines. Use of certain soaps or scented products that cause irritation. Follow these instructions at home: Medicines Take over-the-counter and prescription medicines only as told by your health care provider. If you were prescribed an antibiotic medicine, take it as told by your health care provider. Do not stop taking the antibiotic even if you start to feel better. Eating and drinking  Drink enough fluid to keep your urine pale yellow. Avoid caffeinated beverages, tea, and alcohol. These beverages can irritate the bladder and make dysuria worse. In males, alcohol may irritate the prostate. General instructions Watch your condition for any changes. Urinate often. Avoid holding urine for long periods of time. If you are female, you should wipe from front to back after urinating or having a bowel movement. Use each piece of toilet paper only once. Empty your bladder after sex. Keep all follow-up visits. This is important. If you had any tests done to find the cause of dysuria, it is up to you to get your test results. Ask your health care provider, or the department that is doing the test, when your results will be ready. Contact a health care provider if: You have a fever. You develop pain in your back or  sides. You have nausea or vomiting. You have blood in your urine. You are not urinating as often as you usually do. Get help right away if: Your pain is severe and not relieved with medicines. You cannot eat or drink without vomiting. You are confused. You have a rapid heartbeat while resting. You have shaking or chills. You feel extremely weak. Summary Dysuria is pain or discomfort while urinating. Many different conditions can lead to dysuria. If you have dysuria, you may have to urinate frequently or have the sudden feeling that you have to urinate (urgency). Watch your condition for any changes. Keep all follow-up visits. Make sure that you urinate often and drink enough fluid to keep your urine pale yellow. This information is not intended to replace advice given to you by your health care provider. Make sure you discuss any questions you have with your health care provider. Document Revised: 08/01/2019 Document Reviewed: 08/01/2019 Elsevier Patient Education  2024 Elsevier Inc.  

## 2022-11-10 NOTE — Telephone Encounter (Signed)
Pt called stating that she is under the impression that her UTI has returned and was wondering if she could came by and leave a urine specimen for testing. Please Advise.

## 2022-11-10 NOTE — Telephone Encounter (Signed)
Appt is actually scheduled w/ Dr. Laury Axon later today.

## 2022-11-10 NOTE — Telephone Encounter (Signed)
Will defer to Ramon Dredge since he has been seeing her for this.

## 2022-11-10 NOTE — Telephone Encounter (Signed)
Pt called back and wanted me to schedule her with an appointment this afternoon. However, she asked if nurse could still call her if she's able to just drop off sample.Please calla and advise with patient.

## 2022-11-10 NOTE — Telephone Encounter (Signed)
Pt seeing PCP today at 3

## 2022-11-10 NOTE — Progress Notes (Signed)
Established Patient Office Visit  Subjective   Patient ID: FOLASADE MARENTETTE, female    DOB: 1945/09/10  Age: 77 y.o. MRN: [id number]  Chief Complaint  Patient presents with   Recurrent UTI    HPI Discussed the use of AI scribe software for clinical note transcription with the patient, who gave verbal consent to proceed.  History of Present Illness   The patient, with a history of recurrent urinary tract infections, presents with concerns of a possible recurrence. She reports a recent episode of culture-positive UTI, for which she was treated with Keflex and an injection of Rosadan. Despite initial improvement post-treatment, she has been experiencing urinary symptoms including burning sensation during and after urination, and pressure in the lower urinary tract. These symptoms have been progressively worsening over the past two days. The patient has a history of cystitis following UTIs, which typically resolves over a period of one to three weeks. She has been managing her symptoms with increased fluid intake, avoidance of acidic foods, and cranberry pills.  The patient also has a history of iron deficiency, for which she is taking oral iron supplements with vitamin C. She has been switched to hemocyte due to persistently low iron levels. She also has a history of vaginal atrophy, for which she uses Estrace cream. She has been cautious about using the cream post-infection due to concerns about potential contamination.  The patient's symptoms are causing significant discomfort, but she expresses a strong dislike for antibiotics. She has been using AZO for symptom relief and is considering a prescription for Pyridium. She has also requested a new prescription for Estrace cream.       Review of Systems  Constitutional:  Negative for chills, fever and malaise/fatigue.  HENT:  Negative for congestion and hearing loss.   Eyes:  Negative for blurred vision and discharge.  Respiratory:  Negative  for cough, sputum production and shortness of breath.   Cardiovascular:  Negative for chest pain, palpitations and leg swelling.  Gastrointestinal:  Negative for abdominal pain, blood in stool, constipation, diarrhea, heartburn, nausea and vomiting.  Genitourinary:  Negative for dysuria, frequency, hematuria and urgency.  Musculoskeletal:  Negative for back pain, falls and myalgias.  Skin:  Negative for rash.  Neurological:  Negative for dizziness, sensory change, loss of consciousness, weakness and headaches.  Endo/Heme/Allergies:  Negative for environmental allergies. Does not bruise/bleed easily.  Psychiatric/Behavioral:  Negative for depression and suicidal ideas. The patient is not nervous/anxious and does not have insomnia.       Objective:     BP 130/70 (BP Location: Left Arm, Patient Position: Sitting, Cuff Size: Large)   Pulse 66   Temp 98.7 F (37.1 C) (Oral)   Resp 18   Ht 5' 7.5" (1.715 m)   Wt 258 lb 6.4 oz (117.2 kg)   SpO2 99%   BMI 39.87 kg/m  BP Readings from Last 3 Encounters:  11/10/22 130/70  10/30/22 (!) 127/48  10/05/22 120/64   Wt Readings from Last 3 Encounters:  11/10/22 258 lb 6.4 oz (117.2 kg)  10/30/22 258 lb 6 oz (117.2 kg)  10/05/22 253 lb (114.8 kg)   SpO2 Readings from Last 3 Encounters:  11/10/22 99%  10/30/22 100%  10/05/22 98%      Physical Exam Vitals and nursing note reviewed.  Constitutional:      General: She is not in acute distress.    Appearance: Normal appearance. She is well-developed.  HENT:     Head: Normocephalic and  atraumatic.     Right Ear: Tympanic membrane, ear canal and external ear normal. There is no impacted cerumen.     Left Ear: Tympanic membrane, ear canal and external ear normal. There is no impacted cerumen.     Nose: Nose normal.     Mouth/Throat:     Mouth: Mucous membranes are moist.     Pharynx: Oropharynx is clear. No oropharyngeal exudate or posterior oropharyngeal erythema.  Eyes:     General:  No scleral icterus.       Right eye: No discharge.        Left eye: No discharge.     Conjunctiva/sclera: Conjunctivae normal.     Pupils: Pupils are equal, round, and reactive to light.  Neck:     Thyroid: No thyromegaly or thyroid tenderness.     Vascular: No JVD.  Cardiovascular:     Rate and Rhythm: Normal rate and regular rhythm.     Heart sounds: Normal heart sounds. No murmur heard. Pulmonary:     Effort: Pulmonary effort is normal. No respiratory distress.     Breath sounds: Normal breath sounds.  Abdominal:     General: Bowel sounds are normal. There is no distension.     Palpations: Abdomen is soft. There is no mass.     Tenderness: There is no abdominal tenderness. There is no guarding or rebound.  Genitourinary:    Vagina: Normal.  Musculoskeletal:        General: Normal range of motion.     Cervical back: Normal range of motion and neck supple.     Right lower leg: No edema.     Left lower leg: No edema.  Lymphadenopathy:     Cervical: No cervical adenopathy.  Skin:    General: Skin is warm and dry.     Findings: No erythema or rash.  Neurological:     Mental Status: She is alert and oriented to person, place, and time.     Cranial Nerves: No cranial nerve deficit.     Deep Tendon Reflexes: Reflexes are normal and symmetric.  Psychiatric:        Mood and Affect: Mood normal.        Behavior: Behavior normal.        Thought Content: Thought content normal.        Judgment: Judgment normal.      Results for orders placed or performed in visit on 11/10/22  POCT Urinalysis Dipstick (Automated)  Result Value Ref Range   Color, UA yellow    Clarity, UA clear    Glucose, UA Negative Negative   Bilirubin, UA negative    Ketones, UA negative    Spec Grav, UA <=1.005 (A) 1.010 - 1.025   Blood, UA negative    pH, UA 7.0 5.0 - 8.0   Protein, UA Negative Negative   Urobilinogen, UA 0.2 0.2 or 1.0 E.U./dL   Nitrite, UA negative    Leukocytes, UA Negative  Negative    Last CBC Lab Results  Component Value Date   WBC 7.6 10/05/2022   HGB 12.8 10/05/2022   HCT 40.4 10/05/2022   MCV 92 10/05/2022   MCH 29.0 10/05/2022   RDW 12.5 10/05/2022   PLT 256 10/05/2022   Last metabolic panel Lab Results  Component Value Date   GLUCOSE 101 (H) 09/06/2022   NA 141 09/06/2022   K 4.0 09/06/2022   CL 106 09/06/2022   CO2 27 09/06/2022   BUN 17 09/06/2022  CREATININE 0.87 09/06/2022   GFRNONAA >60 09/06/2022   CALCIUM 10.9 (H) 09/06/2022   PROT 5.8 (L) 03/23/2022   ALBUMIN 3.7 03/23/2022   BILITOT 0.5 03/23/2022   ALKPHOS 75 03/23/2022   AST 18 03/23/2022   ALT 24 03/23/2022   ANIONGAP 8 09/06/2022   Last lipids Lab Results  Component Value Date   CHOL 181 03/23/2022   HDL 55.90 03/23/2022   LDLCALC 97 03/23/2022   TRIG 138.0 03/23/2022   CHOLHDL 3 03/23/2022   Last hemoglobin A1c Lab Results  Component Value Date   HGBA1C 4.4 (L) 09/06/2022   Last thyroid functions Lab Results  Component Value Date   TSH 1.28 07/24/2022   Last vitamin D Lab Results  Component Value Date   VD25OH 47.97 10/30/2022   Last vitamin B12 and Folate Lab Results  Component Value Date   VITAMINB12 >1504 (H) 03/23/2021   FOLATE >24.2 03/23/2021      The 10-year ASCVD risk score (Arnett DK, et al., 2019) is: 19.3%    Assessment & Plan:   Problem List Items Addressed This Visit   None Visit Diagnoses     Urinary tract infection without hematuria, site unspecified    -  Primary   Relevant Medications   estradiol (ESTRACE) 0.1 MG/GM vaginal cream   phenazopyridine (PYRIDIUM) 200 MG tablet   Other Relevant Orders   POCT Urinalysis Dipstick (Automated) (Completed)   Urine Culture   Postmenopause atrophic vaginitis       Relevant Medications   estradiol (ESTRACE) 0.1 MG/GM vaginal cream   Dysuria       Relevant Medications   estradiol (ESTRACE) 0.1 MG/GM vaginal cream   phenazopyridine (PYRIDIUM) 200 MG tablet     Assessment  and Plan    Recurrent Urinary Tract Infection (UTI)   She reports recurrence of UTI symptoms, including dysuria and pressure, despite treatment with Keflex and Rosadan injection. A urine culture was negative, but a repeat culture is planned to confirm the absence of infection. She has a history of cystitis following infections, which sometimes resolves over weeks. She prefers to avoid antibiotics if possible. We will order a urine culture and prescribe Pyridium for symptomatic relief for three days. We advise hydration and avoidance of acidic foods.  Atrophic Vaginitis   She requests a refill of Estrace vaginal cream, previously discontinued post-infection to prevent contamination. She uses [facility] for cost savings. We will prescribe Estrace vaginal cream and provide a physical prescription for use with [facility].  Iron Deficiency Anemia   She is currently taking Hemocyte and vitamin C to aid iron absorption and reports significant iron deficiency. We will continue the Hemocyte and vitamin C supplementation.  General Health Maintenance   She is proactive in managing her health conditions. We encourage the continued use of cranberry pills for urinary health.        Return if symptoms worsen or fail to improve.    Donato Schultz, DO

## 2022-11-11 LAB — URINE CULTURE
MICRO NUMBER:: 15706095
Result:: NO GROWTH
SPECIMEN QUALITY:: ADEQUATE

## 2022-11-14 ENCOUNTER — Ambulatory Visit: Payer: Medicare Other

## 2022-11-16 ENCOUNTER — Ambulatory Visit (INDEPENDENT_AMBULATORY_CARE_PROVIDER_SITE_OTHER): Payer: Medicare Other | Admitting: Family Medicine

## 2022-11-16 ENCOUNTER — Encounter: Payer: Self-pay | Admitting: Family Medicine

## 2022-11-16 VITALS — BP 145/60 | HR 64 | Ht 67.5 in | Wt 253.0 lb

## 2022-11-16 DIAGNOSIS — R197 Diarrhea, unspecified: Secondary | ICD-10-CM

## 2022-11-16 NOTE — Progress Notes (Signed)
Acute Office Visit  Subjective:     Patient ID: Veronica Bradford, female    DOB: Jan 13, 1945, 77 y.o.   MRN: [id number]  Chief Complaint  Patient presents with   Diarrhea     Patient is in today for diarrhea.   Discussed the use of AI scribe software for clinical note transcription with the patient, who gave verbal consent to proceed.  History of Present Illness   The patient, with a history of atrial fibrillation and gastrointestinal issues, presents with a recent urinary tract infection (UTI) and ongoing diarrhea. The UTI symptoms have since resolved.   The patient's primary concern is recurrent bouts of diarrhea, which she describes as "brown water." These episodes are sudden and explosive, with no preceding symptoms except for an unusual sensation of extreme hunger. The patient has a history of similar episodes, some of which have required hospitalization. The diarrhea is typically self-limiting, resolving after a few days to a month, but has recently become more frequent. The patient has been managing these episodes with over-the-counter Pepto Bismol and has been attempting to maintain hydration with electrolyte drinks and coconut water.  The patient has a history of gastrointestinal investigations, including testing for C. diff, ova, parasites, and small bowel bacterial overgrowth. The latter was positive and was treated with antibiotics, which provided some relief. The patient has not noticed any blood in her stool but describes a general abdominal discomfort during these episodes. She denies any fever, nausea, or vomiting.  The patient also reports a recent episode of atrial fibrillation, which she attributes to her current illness. She has a pacemaker. The patient also takes a daily potassium supplement and Spironolactone to maintain her potassium levels due to her use of Tikosyn for atrial fibrillation. Given recent ABX she is concerned about possible C.diff and electrolyte imbalance  due to the frequent diarrhea the past few days.                All review of systems negative except what is listed in the HPI      Objective:    BP (!) 145/60   Pulse 64   Ht 5' 7.5" (1.715 m)   Wt 253 lb (114.8 kg)   SpO2 100%   BMI 39.04 kg/m    Physical Exam Vitals reviewed.  Constitutional:      Appearance: Normal appearance. She is obese.  Cardiovascular:     Rate and Rhythm: Normal rate and regular rhythm.  Pulmonary:     Effort: Pulmonary effort is normal.     Breath sounds: Normal breath sounds.  Abdominal:     General: Bowel sounds are normal. There is no distension.     Tenderness: There is no abdominal tenderness. There is no guarding.  Skin:    General: Skin is warm and dry.  Neurological:     Mental Status: She is alert and oriented to person, place, and time.  Psychiatric:        Mood and Affect: Mood normal.        Behavior: Behavior normal.        Thought Content: Thought content normal.        Judgment: Judgment normal.        No results found for any visits on 11/16/22.      Assessment & Plan:   Problem List Items Addressed This Visit   None Visit Diagnoses     Diarrhea, unspecified type    -  Primary   Relevant  Orders   Comprehensive metabolic panel   Magnesium   CBC with Differential/Platelet   Clostridium Difficile by PCR        Currently experiencing watery diarrhea. No blood or mucus in stool. No associated fever or vomiting. -Collect stool sample for C. diff testing. -Check comprehensive metabolic panel to assess electrolyte balance. -Recommend follow-up with GI specialist  Atrial Fibrillation Patient reports transient episode of AFib, possibly related to current illness. Currently on Tikosyn, Valsartan, and Spironolactone. -Advise patient to continue monitoring heart rhythm and blood pressure at home.  Hypertension Elevated blood pressure noted during visit, possibly due to current illness and cessation of  Lasix due to diarrhea. -Advise patient to continue monitoring blood pressure at home and resume Lasix as needed.          No orders of the defined types were placed in this encounter.   Return if symptoms worsen or fail to improve.  Clayborne Dana, NP

## 2022-11-17 ENCOUNTER — Ambulatory Visit: Payer: Medicare Other | Admitting: Physical Therapy

## 2022-11-17 ENCOUNTER — Encounter: Payer: Self-pay | Admitting: Physical Therapy

## 2022-11-17 DIAGNOSIS — F40298 Other specified phobia: Secondary | ICD-10-CM

## 2022-11-17 DIAGNOSIS — M6281 Muscle weakness (generalized): Secondary | ICD-10-CM | POA: Diagnosis not present

## 2022-11-17 DIAGNOSIS — R2689 Other abnormalities of gait and mobility: Secondary | ICD-10-CM

## 2022-11-17 LAB — COMPREHENSIVE METABOLIC PANEL
ALT: 18 U/L (ref 0–35)
AST: 19 U/L (ref 0–37)
Albumin: 4.3 g/dL (ref 3.5–5.2)
Alkaline Phosphatase: 71 U/L (ref 39–117)
BUN: 14 mg/dL (ref 6–23)
CO2: 28 meq/L (ref 19–32)
Calcium: 9.7 mg/dL (ref 8.4–10.5)
Chloride: 107 meq/L (ref 96–112)
Creatinine, Ser: 0.73 mg/dL (ref 0.40–1.20)
GFR: 80.73 mL/min (ref >60.00)
Glucose, Bld: 91 mg/dL (ref 70–99)
Potassium: 4.4 meq/L (ref 3.5–5.1)
Sodium: 142 meq/L (ref 135–145)
Total Bilirubin: 0.6 mg/dL (ref 0.2–1.2)
Total Protein: 6.6 g/dL (ref 6.0–8.3)

## 2022-11-17 LAB — CBC WITH DIFFERENTIAL/PLATELET
Basophils Absolute: 0 10*3/uL (ref 0.0–0.1)
Basophils Relative: 0.6 % (ref 0.0–3.0)
Eosinophils Absolute: 0.1 10*3/uL (ref 0.0–0.7)
Eosinophils Relative: 1.7 % (ref 0.0–5.0)
HCT: 44.3 % (ref 36.0–46.0)
Hemoglobin: 14.2 g/dL (ref 12.0–15.0)
Lymphocytes Relative: 18.2 % (ref 12.0–46.0)
Lymphs Abs: 1 10*3/uL (ref 0.7–4.0)
MCHC: 32 g/dL (ref 30.0–36.0)
MCV: 91.5 fL (ref 78.0–100.0)
Monocytes Absolute: 0.6 10*3/uL (ref 0.1–1.0)
Monocytes Relative: 10 % (ref 3.0–12.0)
Neutro Abs: 3.9 10*3/uL (ref 1.4–7.7)
Neutrophils Relative %: 69.5 % (ref 43.0–77.0)
Platelets: 281 10*3/uL (ref 150.0–400.0)
RBC: 4.84 Mil/uL (ref 3.87–5.11)
RDW: 16 % — ABNORMAL HIGH (ref 11.5–15.5)
WBC: 5.7 10*3/uL (ref 4.0–10.5)

## 2022-11-17 LAB — MAGNESIUM: Magnesium: 2 mg/dL (ref 1.5–2.5)

## 2022-11-17 NOTE — Therapy (Signed)
OUTPATIENT PHYSICAL THERAPY LOWER EXTREMITY TREATMENT     Patient Name: Veronica Bradford MRN: [id number] DOB:01/10/1945, 77 y.o., female Today's Date: 11/17/2022  END OF SESSION:  PT End of Session - 11/17/22 1059     Visit Number 4    Date for PT Re-Evaluation 01/23/23    PT Start Time 1100    PT Stop Time 1145    PT Time Calculation (min) 45 min    Activity Tolerance Patient tolerated treatment well    Behavior During Therapy WFL for tasks assessed/performed               Past Medical History:  Diagnosis Date   Atrial fibrillation (HCC)    Atrial fibrillation (HCC)    Back pain    CHF (congestive heart failure) (HCC)    grade 2 HF   Chronic sinusitis    s/p 2 surgeries remotely, Dr Ezzard Standing   Closed fracture of metatarsal of left foot    L foot, fifth metatarsal   Complication of anesthesia    woke up once during procedure   Dysrhythmia    a-fib   Ectopic pregnancy    Endometrial cancer (HCC) 2004   Early diagnosis   GERD (gastroesophageal reflux disease)    Hyperlipemia    Hypertension    IBS (irritable bowel syndrome) 08/13/2014   Dx 2015, diarrhea on-off, Dr Matthias Hughs    Lumbar radiculopathy 08/2012   MRI in 08/2012   Mild hyperparathyroidism (HCC)    Osteopenia    PAF (paroxysmal atrial fibrillation) (HCC)    Presence of permanent cardiac pacemaker    Sciatica of right side 08/2013   Received Depomedrol 80 mg injection   Sleep apnea 09/09/2013   Dx with OSA in 2015, by Dr. Graciela Husbands,   wears CPAPAPAP   Past Surgical History:  Procedure Laterality Date   ATRIAL FIBRILLATION ABLATION N/A 09/04/2017   Procedure: ATRIAL FIBRILLATION ABLATION;  Surgeon: Hillis Range, MD;  Location: [facility];  Service: Cardiovascular;  Laterality: N/A;   BIOPSY  03/11/2020   Procedure: BIOPSY;  Surgeon: Bernette Redbird, MD;  Location: [facility];  Service: Endoscopy;;  EGD and COLON   CARPAL TUNNEL RELEASE Right 06/17/2019   CARPAL TUNNEL RELEASE Left 11/2020    CHOLECYSTECTOMY  ~2006   COLONOSCOPY WITH PROPOFOL N/A 03/11/2020   Procedure: COLONOSCOPY WITH PROPOFOL;  Surgeon: Bernette Redbird, MD;  Location: [facility];  Service: Endoscopy;  Laterality: N/A;   DILATION AND CURETTAGE OF UTERUS     after SAB   ESOPHAGOGASTRODUODENOSCOPY  02/09/2014   Dr. Matthias Hughs   ESOPHAGOGASTRODUODENOSCOPY (EGD) WITH PROPOFOL N/A 03/11/2020   Procedure: ESOPHAGOGASTRODUODENOSCOPY (EGD) WITH PROPOFOL;  Surgeon: Bernette Redbird, MD;  Location: [facility];  Service: Endoscopy;  Laterality: N/A;   FINGER SURGERY Left    index   NASAL SINUS SURGERY     x 2 remotely, Dr Ezzard Standing   PACEMAKER IMPLANT N/A 09/13/2020   Procedure: PACEMAKER IMPLANT;  Surgeon: Duke Salvia, MD;  Location: [facility] INVASIVE CV LAB;  Service: Cardiovascular;  Laterality: N/A;   PARATHYROIDECTOMY Left 09/14/2022   Procedure: LEFT PARATHYROIDECTOMY;  Surgeon: Darnell Level, MD;  Location: [facility];  Service: General;  Laterality: Left;   TONSILLECTOMY     TOTAL ABDOMINAL HYSTERECTOMY W/ BILATERAL SALPINGOOPHORECTOMY     TOTAL HIP ARTHROPLASTY Left 07/18/2021   Procedure: TOTAL HIP ARTHROPLASTY ANTERIOR APPROACH;  Surgeon: Samson Frederic, MD;  Location: [facility];  Service: Orthopedics;  Laterality: Left;   TOTAL KNEE ARTHROPLASTY Right ~2008  Patient Active Problem List   Diagnosis Date Noted   Hyperparathyroidism, primary (HCC) 09/03/2022   DOE (dyspnea on exertion) 05/04/2022   Paroxysmal A-fib (HCC) 01/03/2022   Atrial fibrillation and flutter (HCC) - Persistent 08/16/2021   Closed left hip fracture, initial encounter (HCC) 07/14/2021   Chronic diastolic CHF (congestive heart failure) (HCC) 07/14/2021   Pacemaker 12/14/2020   RBBB 08/09/2020   PVC's (premature ventricular contractions) 08/09/2020   Sinus bradycardia - junctional rhythm 08/09/2020   Carpal tunnel syndrome, right 07/08/2019   Vitamin D deficiency 01/20/2019   Macular pucker, right eye 11/18/2018   Multiple pulmonary nodules  determined by computed tomography of lung 12/06/2017   Paroxysmal atrial fibrillation (HCC) 09/04/2017   Osteopenia 05/23/2017   Annual physical exam 04/14/2015   PCP NOTES >>>>> 12/23/2014   IBS (irritable bowel syndrome) 08/13/2014   Hyperglycemia    Chronic sinusitis    Endometrial cancer (HCC)    Back pain-- on tylenol #3 prn    GERD (gastroesophageal reflux disease)    HTN (hypertension) 09/09/2013   Diastolic dysfunction-grade 2 09/09/2013   Chronic anticoagulation 09/09/2013   Edema of both legs 09/09/2013   Dyslipidemia 09/09/2013   Obesity (BMI 30-39.9) 09/09/2013   OSA on CPAP 09/09/2013    PCP: Willow Ora  REFERRING PROVIDER: Trudee Grip  REFERRING DIAG: Gait disturbances and LE weakness  THERAPY DIAG:  Other abnormalities of gait and mobility  Muscle weakness (generalized)  Balance disorder  Fear of falling  Rationale for Evaluation and Treatment: Rehabilitation  ONSET DATE: 10/06/22  SUBJECTIVE:   SUBJECTIVE STATEMENT: Reports has had a significant bout with IBS and is feeling very weak  PERTINENT HISTORY: THA- 07/18/21 TKA 2008 Pacemaker 2022 Trochanteric bursitis OA L knee  A fib CHF   PAIN:  Are you having pain? No 0/10  PRECAUTIONS: None  RED FLAGS: None   WEIGHT BEARING RESTRICTIONS: No  FALLS:  Has patient fallen in last 6 months? No  LIVING ENVIRONMENT: Lives with: lives with their family Lives in: House/apartment Stairs: Yes: Internal: 14 steps; can reach both   OCCUPATION: Retired Engineer, civil (consulting)   PLOF: Independent  PATIENT GOALS: to feel more stable    OBJECTIVE:  Note: Objective measures were completed at Evaluation unless otherwise noted.  DIAGNOSTIC FINDINGS: N/A    COGNITION: Overall cognitive status: Within functional limits for tasks assessed     SENSATION: WFL   MUSCLE LENGTH: Hamstrings: tightness BLE  POSTURE: rounded shoulders and forward head   LOWER EXTREMITY ROM: some hip flexor limitation due  to weakness   LOWER EXTREMITY MMT: hip flexor 3+/5 and 4/5 generally    FUNCTIONAL TESTS:  5 times sit to stand: 21.15s from chair  Timed up and go (TUG): 10.19s Berg Balance Scale: 48/56    TODAY'S TREATMENT:  DATE:  11/17/22 Feet on ball K2C, rotation, bridge and iso abs Green tband clam shells Ball b/n knees squeeze On airex balance beam side stepping On airex cone toe touches On airex ball toss Nustep level 4 x 6 minutes Resisted gait 40# fwd and backward 20# leg curls 2x10  11/10/22 NuStep L5x57mins LE only  Leg ext 10# 2x10 HS curls 20# 2x10 STS x10, x10 on airex  Lateral band steps green x20 steps Seated hip abduction green x10, blue x10   11/02/22 Nustep L4 x6 minutes BLEs only  Bridges with red TB x12  Supine clams red TB x12 Figure four stretch 3x30 seconds B L hip flexor lifts off edge of table 2x5  In // bars: Tandem stance 3x30 seconds B  Narrow BOS blue foam pad 3x30 seconds    EVAL 10/31/22    PATIENT EDUCATION:  Education details: POC and HEP Person educated: Patient Education method: Explanation Education comprehension: verbalized understanding  HOME EXERCISE PROGRAM: Access Code: [email or web address] URL: [email or web address] Date: 10/31/2022 Prepared by: Cassie Freer  Exercises - Standing Tandem Balance with Counter Support  - 1 x daily - 7 x weekly - 2 reps - 30 hold - Single Leg Stance with Support  - 1 x daily - 7 x weekly - 2 reps - 10 hold - Sit to Stand  - 1 x daily - 7 x weekly - 2 sets - 10 reps - Standing Marching  - 1 x daily - 7 x weekly - 2 sets - 10 reps  ASSESSMENT:  CLINICAL IMPRESSION: Pt arrives reporting that over the past week she has had issues with IBS and feels weak.  She was able to tolerate a progressing of exercises without much difficulty, did have a lot of fatigue with the  exercises and mild loss of balance some pain I the knees with the leg curls. Will continue to progress as able and tolerated.   OBJECTIVE IMPAIRMENTS: Abnormal gait, decreased activity tolerance, decreased balance, decreased coordination, difficulty walking, and decreased strength.   ACTIVITY LIMITATIONS: squatting, stairs, and locomotion level  REHAB POTENTIAL: Good  CLINICAL DECISION MAKING: Stable/uncomplicated  EVALUATION COMPLEXITY: Low  GOALS: Goals reviewed with patient? Yes  SHORT TERM GOALS: Target date: 12/12/22  Patient will be independent with initial HEP. Goal status: progressing 11/17/22  2.  Patient will demonstrate decreased 5xSTS from chair to <15s Baseline: 21.15s  Goal status: progressing 11/17/22   LONG TERM GOALS: Target date: 01/23/23  Patient will be independent with advanced/ongoing HEP to improve outcomes and carryover.  Goal status: INITIAL  2.  Patient will be able to do SLS for 5-10s.  Baseline: 2s  Goal status: INITIAL  3.  Patient will be able to navigate stairs with reciprocal alternating pattern.  Baseline: does 1 steps at a time  Goal status: INITIAL   4.  Patient will demonstrate improved hip flexor strength to 5/5 Baseline: 3+/5  Goal status: INITIAL  5.  Patient will be able to do full tandem stance for 15s or better. Baseline: unable to get in position Goal status: INITIAL  6.  Patient will score 53 on Berg Balance test to demonstrate lower risk of falls.  Baseline: 48 Goal status: INITIAL   PLAN:  PT FREQUENCY: 2x/week  PT DURATION: 12 weeks  PLANNED INTERVENTIONS: 97110-Therapeutic exercises, 97530- Therapeutic activity, [id number]- Neuromuscular re-education, 97535- Self Care, [id number]- Manual therapy, [phone]- Gait training, Patient/Family education, Balance training, Stair training, Cryotherapy, and Moist heat  PLAN FOR NEXT SESSION: work on balance,  hip flexor strengthening, regular HEP updates   Stacie Glaze,  PT 11/17/22 11:00 AM

## 2022-11-20 ENCOUNTER — Other Ambulatory Visit: Payer: Medicare Other

## 2022-11-20 DIAGNOSIS — I83812 Varicose veins of left lower extremities with pain: Secondary | ICD-10-CM | POA: Diagnosis not present

## 2022-11-20 DIAGNOSIS — R197 Diarrhea, unspecified: Secondary | ICD-10-CM

## 2022-11-20 DIAGNOSIS — I83892 Varicose veins of left lower extremities with other complications: Secondary | ICD-10-CM | POA: Diagnosis not present

## 2022-11-20 NOTE — Progress Notes (Signed)
Specimen drop off

## 2022-11-21 ENCOUNTER — Encounter: Payer: Self-pay | Admitting: Family Medicine

## 2022-11-21 ENCOUNTER — Ambulatory Visit: Payer: Medicare Other

## 2022-11-21 LAB — CLOSTRIDIUM DIFFICILE BY PCR: Toxigenic C. Difficile by PCR: NEGATIVE

## 2022-11-22 ENCOUNTER — Encounter (HOSPITAL_BASED_OUTPATIENT_CLINIC_OR_DEPARTMENT_OTHER): Payer: Self-pay

## 2022-11-22 ENCOUNTER — Telehealth: Payer: Self-pay | Admitting: Physician Assistant

## 2022-11-22 ENCOUNTER — Other Ambulatory Visit: Payer: Self-pay

## 2022-11-22 ENCOUNTER — Emergency Department (HOSPITAL_BASED_OUTPATIENT_CLINIC_OR_DEPARTMENT_OTHER): Payer: Medicare Other

## 2022-11-22 ENCOUNTER — Emergency Department (HOSPITAL_BASED_OUTPATIENT_CLINIC_OR_DEPARTMENT_OTHER)
Admission: EM | Admit: 2022-11-22 | Discharge: 2022-11-22 | Disposition: A | Discharge status: Home / Self Care | Payer: Medicare Other

## 2022-11-22 DIAGNOSIS — M1712 Unilateral primary osteoarthritis, left knee: Secondary | ICD-10-CM | POA: Diagnosis not present

## 2022-11-22 DIAGNOSIS — M542 Cervicalgia: Secondary | ICD-10-CM | POA: Insufficient documentation

## 2022-11-22 DIAGNOSIS — R519 Headache, unspecified: Secondary | ICD-10-CM | POA: Diagnosis not present

## 2022-11-22 DIAGNOSIS — Z87891 Personal history of nicotine dependence: Secondary | ICD-10-CM | POA: Diagnosis not present

## 2022-11-22 DIAGNOSIS — Z7901 Long term (current) use of anticoagulants: Secondary | ICD-10-CM | POA: Diagnosis not present

## 2022-11-22 DIAGNOSIS — I509 Heart failure, unspecified: Secondary | ICD-10-CM | POA: Diagnosis not present

## 2022-11-22 DIAGNOSIS — M25561 Pain in right knee: Secondary | ICD-10-CM | POA: Diagnosis not present

## 2022-11-22 DIAGNOSIS — W010XXA Fall on same level from slipping, tripping and stumbling without subsequent striking against object, initial encounter: Secondary | ICD-10-CM | POA: Insufficient documentation

## 2022-11-22 DIAGNOSIS — W19XXXA Unspecified fall, initial encounter: Secondary | ICD-10-CM

## 2022-11-22 DIAGNOSIS — S8002XA Contusion of left knee, initial encounter: Secondary | ICD-10-CM | POA: Diagnosis not present

## 2022-11-22 DIAGNOSIS — S0081XA Abrasion of other part of head, initial encounter: Secondary | ICD-10-CM | POA: Insufficient documentation

## 2022-11-22 DIAGNOSIS — M25461 Effusion, right knee: Secondary | ICD-10-CM | POA: Diagnosis not present

## 2022-11-22 DIAGNOSIS — Z79899 Other long term (current) drug therapy: Secondary | ICD-10-CM | POA: Insufficient documentation

## 2022-11-22 DIAGNOSIS — Z96651 Presence of right artificial knee joint: Secondary | ICD-10-CM | POA: Diagnosis not present

## 2022-11-22 DIAGNOSIS — I110 Hypertensive heart disease with heart failure: Secondary | ICD-10-CM | POA: Diagnosis not present

## 2022-11-22 DIAGNOSIS — M25562 Pain in left knee: Secondary | ICD-10-CM | POA: Diagnosis not present

## 2022-11-22 DIAGNOSIS — S8992XA Unspecified injury of left lower leg, initial encounter: Secondary | ICD-10-CM | POA: Diagnosis present

## 2022-11-22 DIAGNOSIS — G44309 Post-traumatic headache, unspecified, not intractable: Secondary | ICD-10-CM | POA: Diagnosis not present

## 2022-11-22 NOTE — Telephone Encounter (Signed)
   The patient called the answering service after-hours today. Tried to call the first number relayed and it only goes directly to a VM for her spouse. Tried home phone and she answered right away. She fell and hit her head about an hour and a half ago. She tripped. She hit her head a desk on the way down and now has a goose egg. She is on Xarelto. She has no acute neurologic deficits. She is wondering if she should go to the ER since she is on a blood thinner. Given the mechanism of head trauma, her age, the injury sustained and anticoagulation I recommended she proceed to ER for evaluation. She knows not to drive herself. Her husband is there with her. The patient verbalized understanding and gratitude. Will cc to Dr. Graciela Husbands as Lorain Childes.  Laurann Montana, PA-C

## 2022-11-22 NOTE — ED Notes (Signed)
Pt CAOx4, NAD. Pt advised she hurts on her knees and upper shoulders. No LOC, but wants eval. Comfortable in bed.

## 2022-11-22 NOTE — ED Notes (Signed)
Bruise and swelling noted with minor abrasion to hairline, to R side of midline.

## 2022-11-22 NOTE — Discharge Instructions (Signed)
As discussed, workup today overall reassuring.  CT imaging of your head and neck were without abnormality.  X-rays of both your knees did not show signs of fracture or dislocation.  You may continue to treat your knee pain with ice as well as elevation above level of your heart to help with swelling.  Recommend taking Tylenol for pain.  Please do not hesitate to return to emergency department for worrisome signs and symptoms we discussed become apparent.

## 2022-11-22 NOTE — ED Triage Notes (Addendum)
He reports she tripped over a cord in her computer room and fell forward onto the hardwood floor tonight around 1800. She braced herself with her hands and during the brace her head hit her desk. No LOC but she does take Xarelto, last dose last night. Bruise noted to right side of her forehead. She also has a bruise to each knee cap.

## 2022-11-22 NOTE — ED Provider Notes (Signed)
[place] [facility] AT [facility] Provider Note   CSN: [id number] Arrival date & time: 11/22/22  2002     History  Chief Complaint  Patient presents with   Fall    Xarelto    Veronica Bradford is a 77 y.o. female.   Fall   77 year old female presents emergency department with complaints of fall.  Patient states that she fall when she tripped over a cord around 6:00 tonight.  Patient is on Xarelto secondary to intermittent atrial fibrillation.  Reports landing on both of her knees and tried to brace herself on a nearby bench and subsequently hit her forehead on the area.  Denies loss of consciousness.  Denies any visual disturbance, gait abnormality from baseline, slurred speech, facial droop or weakness/sensory deficits in upper or lower extremities.  Denies any chest pain, shortness of breath, abdominal pain, nausea, vomiting.  Has been able to ambulate but with pain bilateral knee.  Past medical history significant for paroxysmal atrial fibrillation on Xarelto, hyperparathyroidism, GERD, hypertension, hyperlipidemia, CHF, sciatica, IBS, OSA  Home Medications Prior to Admission medications   Medication Sig Start Date End Date Taking? Authorizing Provider  acetaminophen (TYLENOL) 500 MG tablet Take 500 mg by mouth every 6 (six) hours as needed for moderate pain or headache.    [provider]  acetaminophen-codeine (TYLENOL #3) 300-30 MG tablet TAKE 1 TABLET BY MOUTH AT BEDTIME AS NEEDED FOR MODERATE PAIN 06/16/22   Wanda Plump, MD  [employer] TEAR [employer] OP Place 1 drop into both eyes 4 (four) times daily as needed (dry/irritated eyes.).    [provider]  Cholecalciferol (VITAMIN D) 50 MCG (2000 UT) tablet Take 2,000 Units by mouth in the morning.    [provider]  Chromium Picolinate 500 MCG TABS Take 500 mcg by mouth daily after supper.    [provider]  Coenzyme Q10 (CO Q 10) 100 MG CAPS Take 100 mg by mouth daily.     [provider]  colestipol (COLESTID) 1 g tablet Take 1 g by mouth 2 (two) times daily as needed. 02/03/22   [provider]  CRANBERRY EXTRACT PO Take 2 tablets by mouth at bedtime.    [provider]  diltiazem (CARDIZEM) 30 MG tablet Take 1 tablet every 4 hours AS NEEDED for heart rate >100 as long as blood pressure >100. Patient taking differently: Take 30 mg by mouth every 4 (four) hours. Take 1 tablet every 4 hours AS NEEDED for heart rate >100 as long as blood pressure >100. Take every 4 hours until next daily dose. 01/27/21   Newman Nip, NP  dofetilide (TIKOSYN) 250 MCG capsule Take 1 capsule (250 mcg total) by mouth 2 (two) times daily. 07/31/22   Fenton, Clint R, PA  estradiol (ESTRACE) 0.1 MG/GM vaginal cream Place 1 Applicatorful vaginally 3 (three) times a week. 11/10/22   Seabron Spates R, DO  ferrous fumarate (HEMOCYTE - 106 MG FE) 325 (106 Fe) MG TABS tablet Take 1 tablet (106 mg of iron total) by mouth 2 (two) times daily. Take apart from pantoprazole 10/11/22   Paz, Nolon Rod, MD  fluticasone [facility]) 50 MCG/ACT nasal spray Place 1 spray into both nostrils daily.    [provider]  folic acid (FOLVITE) 800 MCG tablet Take 800 mcg by mouth in the morning.    [provider]  furosemide (LASIX) 40 MG tablet Take 1 tablet (40 mg total) by mouth as needed. 04/03/22  Duke Salvia, MD  MAGNESIUM GLYCINATE PO Take 240 mg by mouth daily.    [provider]  olopatadine (PATANOL) 0.1 % ophthalmic solution Place 1 drop into both eyes daily as needed (allergies).    [provider]  pantoprazole (PROTONIX) 40 MG tablet Take 1 tablet (40 mg total) by mouth 2 (two) times daily. 01/27/21   Newman Nip, NP  phenazopyridine (PYRIDIUM) 200 MG tablet Take 1 tablet (200 mg total) by mouth 3 (three) times daily as needed for pain. 11/10/22   Seabron Spates R, DO  potassium chloride SA (KLOR-CON M) 20 MEQ tablet Take 1 tablet  (20 mEq total) by mouth daily. 06/23/22   Duke Salvia, MD  rivaroxaban (XARELTO) 20 MG TABS tablet Take 1 tablet (20 mg total) by mouth daily with supper. 08/07/22   Graciella Freer, PA-C  spironolactone (ALDACTONE) 25 MG tablet TAKE 1 TABLET(25 MG) BY MOUTH DAILY 05/01/22   Duke Salvia, MD  sucralfate (CARAFATE) 1 g tablet Take 1 g by mouth 2 (two) times daily.    [provider]  valsartan (DIOVAN) 40 MG tablet Take 1 tablet (40 mg total) by mouth daily. Patient taking differently: Take 10 mg by mouth daily. 07/24/22   Wanda Plump, MD      Allergies    Ibuprofen, Lactose intolerance (gi), Lisinopril, Nsaids, Tape, Tolmetin, Zoster vaccine live, Amoxicillin, and Penicillins    Review of Systems   Review of Systems  All other systems reviewed and are negative.   Physical Exam Updated Vital Signs BP (!) 145/64   Pulse 66   Temp 98.1 F (36.7 C)   Resp 15   Ht 5' 7.5" (1.715 m)   Wt 114.3 kg   SpO2 100%   BMI 38.89 kg/m  Physical Exam Vitals and nursing note reviewed.  Constitutional:      General: She is not in acute distress.    Appearance: She is well-developed.  HENT:     Head: Normocephalic.     Comments: Swelling as well as small abrasion appreciated right forehead along hairline. Eyes:     Conjunctiva/sclera: Conjunctivae normal.  Cardiovascular:     Rate and Rhythm: Normal rate and regular rhythm.  Pulmonary:     Effort: Pulmonary effort is normal. No respiratory distress.     Breath sounds: Normal breath sounds.  Abdominal:     Palpations: Abdomen is soft.     Tenderness: There is no abdominal tenderness.  Musculoskeletal:        General: No swelling.     Cervical back: Neck supple.     Comments: No midline tenderness of cervical and thoracic, lumbar spine without step-off or deformity.  Right paraspinal tenderness noted cervical region with extension down right trapezial ridge.  Otherwise, no tenderness along upper extremities.  Patient  with swelling left greater than right of knees.  Ecchymosis appreciated left anterior knee.  No overlying patellar tenderness and area of swelling otherwise, no medial/lateral joint line tenderness.  Patient able to range bilateral knee but with pain.  Otherwise, no tenderness of lower extremities.  Skin:    General: Skin is warm and dry.     Capillary Refill: Capillary refill takes less than 2 seconds.  Neurological:     Mental Status: She is alert.     Comments: Alert and oriented to self, place, time and event.   Speech is fluent, clear without dysarthria or dysphasia.   Strength 5/5 in upper/lower extremities  Sensation intact in upper/lower extremities   CN I not tested  CN II not tested CN III, IV, VI PERRLA and EOMs intact bilaterally  CN V Intact sensation to sharp and light touch to the face  CN VII facial movements symmetric  CN VIII not tested  CN IX, X no uvula deviation, symmetric rise of soft palate  CN XI 5/5 SCM and trapezius strength bilaterally  CN XII Midline tongue protrusion, symmetric L/R movements     Psychiatric:        Mood and Affect: Mood normal.     ED Results / Procedures / Treatments   Labs (all labs ordered are listed, but only abnormal results are displayed) Labs Reviewed - No data to display  EKG None  Radiology DG Knee Complete 4 Views Right  Result Date: 11/22/2022 CLINICAL DATA:  Recent trip and fall with right knee pain, initial encounter EXAM: RIGHT KNEE - COMPLETE 4+ VIEW COMPARISON:  None Available. FINDINGS: Right knee prosthesis is seen in satisfactory position. No acute fracture or dislocation is noted. No soft tissue abnormality is noted. A joint effusion is seen. IMPRESSION: Right knee replacement without acute bony abnormality. Electronically Signed   By: Alcide Clever M.D.   On: 11/22/2022 23:13   DG Knee Complete 4 Views Left  Result Date: 11/22/2022 CLINICAL DATA:  Recent trip and fall with knee pain, initial encounter  EXAM: LEFT KNEE - COMPLETE 4+ VIEW COMPARISON:  None Available. FINDINGS: Tricompartmental degenerative changes are noted. No joint effusion is seen. No acute fracture or dislocation is noted. Mild anterior soft tissue swelling is seen consistent with the recent injury. IMPRESSION: Degenerative change and soft tissue swelling. No acute bony abnormality is noted. Electronically Signed   By: Alcide Clever M.D.   On: 11/22/2022 23:11   CT Head Wo Contrast  Result Date: 11/22/2022 CLINICAL DATA:  Recent trip and fall with headaches and neck pain, initial encounter EXAM: CT HEAD WITHOUT CONTRAST CT CERVICAL SPINE WITHOUT CONTRAST TECHNIQUE: Multidetector CT imaging of the head and cervical spine was performed following the standard protocol without intravenous contrast. Multiplanar CT image reconstructions of the cervical spine were also generated. RADIATION DOSE REDUCTION: This exam was performed according to the departmental dose-optimization program which includes automated exposure control, adjustment of the mA and/or kV according to patient size and/or use of iterative reconstruction technique. COMPARISON:  None Available. FINDINGS: CT HEAD FINDINGS Brain: No evidence of acute infarction, hemorrhage, hydrocephalus, extra-axial collection or mass lesion/mass effect. Vascular: No hyperdense vessel or unexpected calcification. Skull: Normal. Negative for fracture or focal lesion. Sinuses/Orbits: No acute finding. Other: None. CT CERVICAL SPINE FINDINGS Alignment: Within normal limits. Skull base and vertebrae: 7 cervical segments are well visualized. Vertebral body height is well maintained. Mild osteophytic changes and facet hypertrophic changes are seen. No acute fracture or acute facet abnormality is noted. The odontoid is within normal limits. Soft tissues and spinal canal: Surrounding soft tissue structures are within normal limits. Upper chest: Visualized lung apices are unremarkable. Other: None IMPRESSION:  CT of the head: No acute intracranial abnormality noted. CT of cervical spine: Mild degenerative change without acute abnormality. Electronically Signed   By: Alcide Clever M.D.   On: 11/22/2022 23:10   CT Cervical Spine Wo Contrast  Result Date: 11/22/2022 CLINICAL DATA:  Recent trip and fall with headaches and neck pain, initial encounter EXAM: CT HEAD WITHOUT CONTRAST CT CERVICAL SPINE WITHOUT CONTRAST TECHNIQUE: Multidetector CT imaging of the head and cervical spine was  performed following the standard protocol without intravenous contrast. Multiplanar CT image reconstructions of the cervical spine were also generated. RADIATION DOSE REDUCTION: This exam was performed according to the departmental dose-optimization program which includes automated exposure control, adjustment of the mA and/or kV according to patient size and/or use of iterative reconstruction technique. COMPARISON:  None Available. FINDINGS: CT HEAD FINDINGS Brain: No evidence of acute infarction, hemorrhage, hydrocephalus, extra-axial collection or mass lesion/mass effect. Vascular: No hyperdense vessel or unexpected calcification. Skull: Normal. Negative for fracture or focal lesion. Sinuses/Orbits: No acute finding. Other: None. CT CERVICAL SPINE FINDINGS Alignment: Within normal limits. Skull base and vertebrae: 7 cervical segments are well visualized. Vertebral body height is well maintained. Mild osteophytic changes and facet hypertrophic changes are seen. No acute fracture or acute facet abnormality is noted. The odontoid is within normal limits. Soft tissues and spinal canal: Surrounding soft tissue structures are within normal limits. Upper chest: Visualized lung apices are unremarkable. Other: None IMPRESSION: CT of the head: No acute intracranial abnormality noted. CT of cervical spine: Mild degenerative change without acute abnormality. Electronically Signed   By: Alcide Clever M.D.   On: 11/22/2022 23:10     Procedures Procedures    Medications Ordered in ED Medications - No data to display  ED Course/ Medical Decision Making/ A&P                                 Medical Decision Making Amount and/or Complexity of Data Reviewed Radiology: ordered.   This patient presents to the ED for concern of fall, this involves an extensive number of treatment options, and is a complaint that carries with it a high risk of complications and morbidity.  The differential diagnosis includes AAA, fracture, strain/pain, dislocation, head/thoracic, basilar, musculoskeletal pain.  Pneumothorax,, other   Co morbidities that complicate the patient evaluation  See HPI   Additional history obtained:  Additional history obtained from EMR External records from outside source obtained and reviewed including hospital records   Lab Tests:  N/a   Imaging Studies ordered:  I ordered imaging studies including CT head/cervical spine, bilateral knee x-ray I independently visualized and interpreted imaging which showed  CT head/cervical spine: No acute abnormality.  Mild degenerative changes in cervical spine. Left knee x-ray: No acute osseous abnormality.  Soft tissue swelling. Right knee x-ray: No acute osseous abnormalities.  Soft tissue swelling. I agree with the radiologist interpretation   Cardiac Monitoring: / EKG:  The patient was maintained on a cardiac monitor.  I personally viewed and interpreted the cardiac monitored which showed an underlying rhythm of: Sinus rhythm   Consultations Obtained:  N/a   Problem List / ED Course / Critical interventions / Medication management  Fall Reevaluation of the patient showed that the patient stayed the same I have reviewed the patients home medicines and have made adjustments as needed   Social Determinants of Health:  Former cigarette use.  Denies illicit drug use.   Test / Admission - Considered:  Fall Vitals signs significant for  hypertension blood pressure 171/76. Otherwise within normal range and stable throughout visit. Imaging studies significant for: See above 77 year old female presents emergency department after mechanical fall.  Patient reportedly fell over a cord and was lying on the ground.  Patient with trauma to head as well as bilateral hand.  Nonfocal neurologic exam.  Given patient's age as well as on Xarelto, CT imaging of head  and cervical spine was ordered which were negative for any acute traumatic injury.  Similarly, x-rays of bilateral knee, negative for any acute osseous abnormality.  Patient reassured by negative workup.  Will recommend treatment of pain at home with rest, ice, elevation, Tylenol.  Will recommend follow-up with primary care in outpatient setting for reassessment.  Treatment plan discussed at length with patient and she acknowledged understand was agreeable to said plan.  Patient overall well-appearing, afebrile in no acute distress. Worrisome signs and symptoms were discussed with the patient, and the patient acknowledged understanding to return to the ED if noticed. Patient was stable upon discharge.          Final Clinical Impression(s) / ED Diagnoses Final diagnoses:  Fall, initial encounter    Rx / DC Orders ED Discharge Orders     None         Peter Garter, [place] 11/22/22 2330    Durwin Glaze, MD 11/23/22 [phone]

## 2022-11-22 NOTE — ED Notes (Signed)
Discharge paperwork reviewed entirely with patient, including follow up care. Pain was under control. No prescriptions were called in, but all questions were addressed.  Pt verbalized understanding as well as all parties involved. No questions or concerns voiced at the time of discharge. No acute distress noted.   Pt ambulated out to PVA without incident or assistance.  Pt advised they will notify their PCP immediately., Pt advised they will seek followup care with a specialist and followup with their PCP. , and Pt was given information to obtain and notify a PCP.

## 2022-11-23 ENCOUNTER — Ambulatory Visit: Payer: Medicare Other

## 2022-11-23 ENCOUNTER — Other Ambulatory Visit: Payer: Self-pay | Admitting: Internal Medicine

## 2022-11-23 NOTE — Telephone Encounter (Signed)
Requesting: Tylenol # 3   Contract:  03/23/22 UDS: 03/23/22 Last Visit: 07/24/22 Next Visit: None Last Refill: 06/16/22 #30 and 0RF  Please Advise

## 2022-11-24 NOTE — Telephone Encounter (Signed)
Mychart message sent.

## 2022-11-24 NOTE — Telephone Encounter (Signed)
Prescription sent, PDMP reviewed. Send the patient a message, avoid taking Tylenol #3 together with other pain medicines prescribed by surgery in September

## 2022-11-25 ENCOUNTER — Other Ambulatory Visit: Payer: Self-pay | Admitting: Internal Medicine

## 2022-11-27 ENCOUNTER — Encounter: Payer: Self-pay | Admitting: Internal Medicine

## 2022-11-28 ENCOUNTER — Ambulatory Visit: Payer: Medicare Other | Admitting: Internal Medicine

## 2022-11-28 DIAGNOSIS — L0390 Cellulitis, unspecified: Secondary | ICD-10-CM | POA: Diagnosis not present

## 2022-11-28 DIAGNOSIS — M7989 Other specified soft tissue disorders: Secondary | ICD-10-CM | POA: Diagnosis not present

## 2022-11-28 DIAGNOSIS — I83812 Varicose veins of left lower extremities with pain: Secondary | ICD-10-CM | POA: Diagnosis not present

## 2022-11-28 DIAGNOSIS — M79605 Pain in left leg: Secondary | ICD-10-CM | POA: Diagnosis not present

## 2022-11-28 MED ORDER — PRAVASTATIN SODIUM 40 MG PO TABS: 40.0000 mg | ORAL_TABLET | Freq: Every evening | ORAL | 3 refills | Status: DC

## 2022-11-28 NOTE — Addendum Note (Signed)
Addended by: Alois Cliche on: 11/28/2022 12:56 PM   Modules accepted: Orders

## 2022-11-29 ENCOUNTER — Emergency Department (HOSPITAL_COMMUNITY): Payer: Medicare Other

## 2022-11-29 ENCOUNTER — Inpatient Hospital Stay (HOSPITAL_COMMUNITY)
Admission: EM | Admit: 2022-11-29 | Discharge: 2022-12-02 | DRG: 603 | Disposition: A | Discharge status: Home / Self Care | Payer: Medicare Other | Attending: Family Medicine | Admitting: Family Medicine

## 2022-11-29 ENCOUNTER — Other Ambulatory Visit: Payer: Self-pay

## 2022-11-29 ENCOUNTER — Encounter (HOSPITAL_COMMUNITY): Payer: Self-pay | Admitting: Internal Medicine

## 2022-11-29 DIAGNOSIS — I110 Hypertensive heart disease with heart failure: Secondary | ICD-10-CM | POA: Diagnosis present

## 2022-11-29 DIAGNOSIS — M47814 Spondylosis without myelopathy or radiculopathy, thoracic region: Secondary | ICD-10-CM | POA: Diagnosis not present

## 2022-11-29 DIAGNOSIS — E785 Hyperlipidemia, unspecified: Secondary | ICD-10-CM | POA: Diagnosis present

## 2022-11-29 DIAGNOSIS — Z886 Allergy status to analgesic agent status: Secondary | ICD-10-CM | POA: Diagnosis not present

## 2022-11-29 DIAGNOSIS — R9389 Abnormal findings on diagnostic imaging of other specified body structures: Secondary | ICD-10-CM | POA: Diagnosis not present

## 2022-11-29 DIAGNOSIS — K219 Gastro-esophageal reflux disease without esophagitis: Secondary | ICD-10-CM | POA: Diagnosis not present

## 2022-11-29 DIAGNOSIS — I480 Paroxysmal atrial fibrillation: Secondary | ICD-10-CM | POA: Diagnosis present

## 2022-11-29 DIAGNOSIS — E8809 Other disorders of plasma-protein metabolism, not elsewhere classified: Secondary | ICD-10-CM | POA: Diagnosis present

## 2022-11-29 DIAGNOSIS — Z79899 Other long term (current) drug therapy: Secondary | ICD-10-CM | POA: Diagnosis not present

## 2022-11-29 DIAGNOSIS — Z8249 Family history of ischemic heart disease and other diseases of the circulatory system: Secondary | ICD-10-CM

## 2022-11-29 DIAGNOSIS — L03116 Cellulitis of left lower limb: Principal | ICD-10-CM | POA: Diagnosis present

## 2022-11-29 DIAGNOSIS — Z888 Allergy status to other drugs, medicaments and biological substances status: Secondary | ICD-10-CM

## 2022-11-29 DIAGNOSIS — M8580 Other specified disorders of bone density and structure, unspecified site: Secondary | ICD-10-CM | POA: Diagnosis not present

## 2022-11-29 DIAGNOSIS — Z950 Presence of cardiac pacemaker: Secondary | ICD-10-CM

## 2022-11-29 DIAGNOSIS — Z91048 Other nonmedicinal substance allergy status: Secondary | ICD-10-CM

## 2022-11-29 DIAGNOSIS — Z6839 Body mass index (BMI) 39.0-39.9, adult: Secondary | ICD-10-CM | POA: Diagnosis not present

## 2022-11-29 DIAGNOSIS — Z79818 Long term (current) use of other agents affecting estrogen receptors and estrogen levels: Secondary | ICD-10-CM

## 2022-11-29 DIAGNOSIS — Z833 Family history of diabetes mellitus: Secondary | ICD-10-CM

## 2022-11-29 DIAGNOSIS — E669 Obesity, unspecified: Secondary | ICD-10-CM | POA: Diagnosis present

## 2022-11-29 DIAGNOSIS — G4733 Obstructive sleep apnea (adult) (pediatric): Secondary | ICD-10-CM | POA: Diagnosis not present

## 2022-11-29 DIAGNOSIS — R079 Chest pain, unspecified: Secondary | ICD-10-CM | POA: Diagnosis not present

## 2022-11-29 DIAGNOSIS — L0390 Cellulitis, unspecified: Secondary | ICD-10-CM | POA: Diagnosis not present

## 2022-11-29 DIAGNOSIS — Z8041 Family history of malignant neoplasm of ovary: Secondary | ICD-10-CM

## 2022-11-29 DIAGNOSIS — Z880 Allergy status to penicillin: Secondary | ICD-10-CM

## 2022-11-29 DIAGNOSIS — M7989 Other specified soft tissue disorders: Principal | ICD-10-CM

## 2022-11-29 DIAGNOSIS — Z96651 Presence of right artificial knee joint: Secondary | ICD-10-CM | POA: Diagnosis present

## 2022-11-29 DIAGNOSIS — Z87891 Personal history of nicotine dependence: Secondary | ICD-10-CM | POA: Diagnosis not present

## 2022-11-29 DIAGNOSIS — I5189 Other ill-defined heart diseases: Secondary | ICD-10-CM

## 2022-11-29 DIAGNOSIS — E739 Lactose intolerance, unspecified: Secondary | ICD-10-CM | POA: Diagnosis not present

## 2022-11-29 DIAGNOSIS — Z8542 Personal history of malignant neoplasm of other parts of uterus: Secondary | ICD-10-CM

## 2022-11-29 DIAGNOSIS — I5032 Chronic diastolic (congestive) heart failure: Secondary | ICD-10-CM | POA: Diagnosis not present

## 2022-11-29 DIAGNOSIS — E66812 Obesity, class 2: Secondary | ICD-10-CM | POA: Diagnosis present

## 2022-11-29 DIAGNOSIS — Z800 Family history of malignant neoplasm of digestive organs: Secondary | ICD-10-CM

## 2022-11-29 DIAGNOSIS — Z7901 Long term (current) use of anticoagulants: Secondary | ICD-10-CM | POA: Diagnosis not present

## 2022-11-29 DIAGNOSIS — Z808 Family history of malignant neoplasm of other organs or systems: Secondary | ICD-10-CM

## 2022-11-29 DIAGNOSIS — I10 Essential (primary) hypertension: Secondary | ICD-10-CM | POA: Diagnosis present

## 2022-11-29 DIAGNOSIS — M546 Pain in thoracic spine: Secondary | ICD-10-CM | POA: Diagnosis not present

## 2022-11-29 DIAGNOSIS — Z96642 Presence of left artificial hip joint: Secondary | ICD-10-CM | POA: Diagnosis present

## 2022-11-29 DIAGNOSIS — Z803 Family history of malignant neoplasm of breast: Secondary | ICD-10-CM

## 2022-11-29 LAB — CBC WITH DIFFERENTIAL/PLATELET
Abs Immature Granulocytes: 0.02 10*3/uL (ref 0.00–0.07)
Basophils Absolute: 0.1 10*3/uL (ref 0.0–0.1)
Basophils Relative: 1 %
Eosinophils Absolute: 0.1 10*3/uL (ref 0.0–0.5)
Eosinophils Relative: 1 %
HCT: 45 % (ref 36.0–46.0)
Hemoglobin: 14.2 g/dL (ref 12.0–15.0)
Immature Granulocytes: 0 %
Lymphocytes Relative: 16 %
Lymphs Abs: 1.3 10*3/uL (ref 0.7–4.0)
MCH: 28.9 pg (ref 26.0–34.0)
MCHC: 31.6 g/dL (ref 30.0–36.0)
MCV: 91.5 fL (ref 80.0–100.0)
Monocytes Absolute: 0.6 10*3/uL (ref 0.1–1.0)
Monocytes Relative: 7 %
Neutro Abs: 5.9 10*3/uL (ref 1.7–7.7)
Neutrophils Relative %: 75 %
Platelets: 266 10*3/uL (ref 150–400)
RBC: 4.92 MIL/uL (ref 3.87–5.11)
RDW: 14.6 % (ref 11.5–15.5)
WBC: 7.9 10*3/uL (ref 4.0–10.5)
nRBC: 0 % (ref 0.0–0.2)

## 2022-11-29 LAB — URINALYSIS, ROUTINE W REFLEX MICROSCOPIC
Bacteria, UA: NONE SEEN
Bilirubin Urine: NEGATIVE
Glucose, UA: NEGATIVE mg/dL
Hgb urine dipstick: NEGATIVE
Ketones, ur: NEGATIVE mg/dL
Leukocytes,Ua: NEGATIVE
Nitrite: NEGATIVE
Protein, ur: NEGATIVE mg/dL
Specific Gravity, Urine: 1.002 — ABNORMAL LOW (ref 1.005–1.030)
pH: 6 (ref 5.0–8.0)

## 2022-11-29 LAB — COMPREHENSIVE METABOLIC PANEL
ALT: 21 U/L (ref 0–44)
AST: 29 U/L (ref 15–41)
Albumin: 4.3 g/dL (ref 3.5–5.0)
Alkaline Phosphatase: 71 U/L (ref 38–126)
Anion gap: 11 (ref 5–15)
BUN: 17 mg/dL (ref 8–23)
CO2: 26 mmol/L (ref 22–32)
Calcium: 9.6 mg/dL (ref 8.9–10.3)
Chloride: 102 mmol/L (ref 98–111)
Creatinine, Ser: 0.78 mg/dL (ref 0.44–1.00)
GFR, Estimated: >60 mL/min (ref >60)
Glucose, Bld: 104 mg/dL — ABNORMAL HIGH (ref 70–99)
Potassium: 3.5 mmol/L (ref 3.5–5.1)
Sodium: 139 mmol/L (ref 135–145)
Total Bilirubin: 0.9 mg/dL (ref <1.2)
Total Protein: 7.7 g/dL (ref 6.5–8.1)

## 2022-11-29 MED ORDER — RIVAROXABAN 20 MG PO TABS
20.0000 mg | ORAL_TABLET | Freq: Every day | ORAL | Status: DC
  Administered 2022-11-29 – 2022-12-01 (×3): 20 mg via ORAL
  Filled 2022-11-29 (×3): qty 1

## 2022-11-29 MED ORDER — FERROUS FUMARATE 324 (106 FE) MG PO TABS
1.0000 | ORAL_TABLET | Freq: Every day | ORAL | Status: DC
  Administered 2022-11-30 – 2022-12-02 (×3): 106 mg via ORAL
  Filled 2022-11-29 (×3): qty 1

## 2022-11-29 MED ORDER — OXYCODONE HCL 5 MG PO TABS: 5.0000 mg | ORAL_TABLET | ORAL | Status: DC | PRN

## 2022-11-29 MED ORDER — HYDROCODONE-ACETAMINOPHEN 5-325 MG PO TABS
1.0000 | ORAL_TABLET | Freq: Four times a day (QID) | ORAL | Status: DC | PRN
  Administered 2022-11-29 – 2022-12-02 (×9): 1 via ORAL
  Filled 2022-11-29 (×9): qty 1

## 2022-11-29 MED ORDER — VANCOMYCIN HCL 1500 MG/300ML IV SOLN
1500.0000 mg | Freq: Every day | INTRAVENOUS | Status: DC
  Administered 2022-11-30: 1500 mg via INTRAVENOUS
  Filled 2022-11-29: qty 300

## 2022-11-29 MED ORDER — VANCOMYCIN HCL 1500 MG/300ML IV SOLN
1500.0000 mg | Freq: Once | INTRAVENOUS | Status: DC
  Filled 2022-11-29: qty 300

## 2022-11-29 MED ORDER — FUROSEMIDE 40 MG PO TABS: 40.0000 mg | ORAL_TABLET | ORAL | Status: DC | PRN

## 2022-11-29 MED ORDER — SPIRONOLACTONE 25 MG PO TABS
25.0000 mg | ORAL_TABLET | Freq: Every day | ORAL | Status: DC
  Administered 2022-11-30 – 2022-12-02 (×3): 25 mg via ORAL
  Filled 2022-11-29 (×3): qty 1

## 2022-11-29 MED ORDER — ACETAMINOPHEN 325 MG PO TABS: 650.0000 mg | ORAL_TABLET | Freq: Four times a day (QID) | ORAL | Status: DC | PRN

## 2022-11-29 MED ORDER — PANTOPRAZOLE SODIUM 40 MG PO TBEC
40.0000 mg | DELAYED_RELEASE_TABLET | Freq: Every day | ORAL | Status: DC
  Administered 2022-11-29 – 2022-12-01 (×3): 40 mg via ORAL
  Filled 2022-11-29 (×3): qty 1

## 2022-11-29 MED ORDER — FOLIC ACID 1 MG PO TABS
1.0000 mg | ORAL_TABLET | Freq: Every day | ORAL | Status: DC
  Administered 2022-11-30 – 2022-12-02 (×3): 1 mg via ORAL
  Filled 2022-11-29 (×3): qty 1

## 2022-11-29 MED ORDER — PRAVASTATIN SODIUM 40 MG PO TABS
40.0000 mg | ORAL_TABLET | Freq: Every evening | ORAL | Status: DC
  Administered 2022-11-29 – 2022-12-01 (×3): 40 mg via ORAL
  Filled 2022-11-29 (×3): qty 1

## 2022-11-29 MED ORDER — FLUTICASONE PROPIONATE 50 MCG/ACT NA SUSP
1.0000 | Freq: Every day | NASAL | Status: DC
  Administered 2022-11-29 – 2022-12-01 (×3): 1 via NASAL
  Filled 2022-11-29: qty 16

## 2022-11-29 MED ORDER — ACETAMINOPHEN 650 MG RE SUPP: 650.0000 mg | Freq: Four times a day (QID) | RECTAL | Status: DC | PRN

## 2022-11-29 MED ORDER — VANCOMYCIN HCL IN DEXTROSE 1-5 GM/200ML-% IV SOLN: 1000.0000 mg | Freq: Once | INTRAVENOUS | Status: DC

## 2022-11-29 MED ORDER — DOFETILIDE 250 MCG PO CAPS
250.0000 ug | ORAL_CAPSULE | Freq: Two times a day (BID) | ORAL | Status: DC
  Administered 2022-11-29 – 2022-12-02 (×6): 250 ug via ORAL
  Filled 2022-11-29 (×6): qty 1

## 2022-11-29 MED ORDER — OLOPATADINE HCL 0.1 % OP SOLN
1.0000 [drp] | Freq: Every day | OPHTHALMIC | Status: DC | PRN
Start: 2022-11-29 — End: 2022-12-02

## 2022-11-29 MED ORDER — FERROUS FUMARATE 325 (106 FE) MG PO TABS: 1.0000 | ORAL_TABLET | Freq: Every day | ORAL | Status: DC

## 2022-11-29 MED ORDER — VANCOMYCIN HCL IN DEXTROSE 1-5 GM/200ML-% IV SOLN
1000.0000 mg | Freq: Once | INTRAVENOUS | Status: DC
  Administered 2022-11-29: 1000 mg via INTRAVENOUS
  Filled 2022-11-29: qty 200

## 2022-11-29 MED ORDER — FOLIC ACID 800 MCG PO TABS: 800.0000 ug | ORAL_TABLET | Freq: Every morning | ORAL | Status: DC

## 2022-11-29 MED ORDER — VANCOMYCIN HCL IN DEXTROSE 1-5 GM/200ML-% IV SOLN
1000.0000 mg | Freq: Once | INTRAVENOUS | Status: AC
  Administered 2022-11-29: 1000 mg via INTRAVENOUS
  Filled 2022-11-29: qty 200

## 2022-11-29 MED ORDER — POTASSIUM CHLORIDE CRYS ER 20 MEQ PO TBCR
20.0000 meq | EXTENDED_RELEASE_TABLET | Freq: Every day | ORAL | Status: DC
  Administered 2022-11-30 – 2022-12-01 (×2): 20 meq via ORAL
  Filled 2022-11-29 (×2): qty 1

## 2022-11-29 MED ORDER — SODIUM CHLORIDE 0.9 % IV SOLN
2.0000 g | Freq: Every day | INTRAVENOUS | Status: DC
  Administered 2022-11-29 – 2022-12-02 (×4): 2 g via INTRAVENOUS
  Filled 2022-11-29 (×4): qty 20

## 2022-11-29 MED ORDER — MAGNESIUM OXIDE -MG SUPPLEMENT 400 (240 MG) MG PO TABS
400.0000 mg | ORAL_TABLET | Freq: Every day | ORAL | Status: DC
  Administered 2022-11-30 – 2022-12-02 (×3): 400 mg via ORAL
  Filled 2022-11-29 (×3): qty 1

## 2022-11-29 NOTE — ED Provider Notes (Signed)
[place] [facility] DEPARTMENT AT [facility] Provider Note   CSN: [id number] Arrival date & time: 11/29/22  1147     History  Chief Complaint  Patient presents with   Leg Swelling   Cellulitis    PADME Veronica Bradford is a 77 y.o. female.  Patient has a history of hypertension and atrial fibs and congestive heart failure.  She developed a rash to her left lower leg.  She saw her vascular surgeon who did an ultrasound and there was no blood clot.  The patient was put on doxycycline but now her swelling and redness is gotten worse.  The history is provided by the patient and medical records. No language interpreter was used.  Rash Location: Left lower leg. Severity:  Moderate Onset quality:  Sudden Timing:  Constant Progression:  Worsening Chronicity:  New Context: not animal contact   Relieved by:  Nothing Worsened by:  Nothing Associated symptoms: no abdominal pain, no diarrhea, no fatigue and no headaches        Home Medications Prior to Admission medications   Medication Sig Start Date End Date Taking? Authorizing Provider  acetaminophen (TYLENOL) 500 MG tablet Take 500 mg by mouth every 6 (six) hours as needed for moderate pain or headache.    [provider]  acetaminophen-codeine (TYLENOL #3) 300-30 MG tablet TAKE 1 TABLET BY MOUTH AT BEDTIME AS NEEDED FOR MODERATE PAIN 11/24/22   Wanda Plump, MD  ARTIFICIAL TEAR SOLUTION OP Place 1 drop into both eyes 4 (four) times daily as needed (dry/irritated eyes.).    [provider]  Cholecalciferol (VITAMIN D) 50 MCG (2000 UT) tablet Take 2,000 Units by mouth in the morning.    [provider]  Chromium Picolinate 500 MCG TABS Take 500 mcg by mouth daily after supper.    [provider]  Coenzyme Q10 (CO Q 10) 100 MG CAPS Take 100 mg by mouth daily.    [provider]  colestipol (COLESTID) 1 g tablet Take 1 g by mouth 2 (two) times daily as needed. 02/03/22   [provider]  CRANBERRY EXTRACT PO Take 2 tablets by mouth at bedtime.    [provider]  diltiazem (CARDIZEM) 30 MG tablet Take 1 tablet every 4 hours AS NEEDED for heart rate >100 as long as blood pressure >100. Patient taking differently: Take 30 mg by mouth every 4 (four) hours. Take 1 tablet every 4 hours AS NEEDED for heart rate >100 as long as blood pressure >100. Take every 4 hours until next daily dose. 01/27/21   Newman Nip, NP  dofetilide (TIKOSYN) 250 MCG capsule Take 1 capsule (250 mcg total) by mouth 2 (two) times daily. 07/31/22   Fenton, Clint R, PA  estradiol (ESTRACE) 0.1 MG/GM vaginal cream Place 1 Applicatorful vaginally 3 (three) times a week. 11/10/22   Seabron Spates R, DO  ferrous fumarate (HEMOCYTE - 106 MG FE) 325 (106 Fe) MG TABS tablet Take 1 tablet (106 mg of iron total) by mouth 2 (two) times daily. Take apart from pantoprazole 10/11/22   Paz, Nolon Rod, MD  fluticasone [facility]) 50 MCG/ACT nasal spray Place 1 spray into both nostrils daily.    [provider]  folic acid (FOLVITE) 800 MCG tablet Take 800 mcg by mouth in the morning.    [provider]  furosemide (LASIX) 40 MG tablet Take 1 tablet (40 mg total) by mouth as needed. 04/03/22   Duke Salvia, MD  MAGNESIUM GLYCINATE PO Take 240 mg by mouth daily.    [provider]  olopatadine (PATANOL) 0.1 % ophthalmic solution Place 1 drop into both eyes daily as needed (allergies).    [provider]  pantoprazole (PROTONIX) 40 MG tablet Take 1 tablet (40 mg total) by mouth 2 (two) times daily. 01/27/21   Newman Nip, NP  phenazopyridine (PYRIDIUM) 200 MG tablet Take 1 tablet (200 mg total) by mouth 3 (three) times daily as needed for pain. 11/10/22   Seabron Spates R, DO  potassium chloride SA (KLOR-CON M) 20 MEQ tablet Take 1 tablet (20 mEq total) by mouth daily. 06/23/22   Duke Salvia, MD  pravastatin (PRAVACHOL) 40 MG tablet Take 1 tablet (40 mg total)  by mouth every evening. 11/28/22   Duke Salvia, MD  rivaroxaban (XARELTO) 20 MG TABS tablet Take 1 tablet (20 mg total) by mouth daily with supper. 08/07/22   Graciella Freer, PA-C  spironolactone (ALDACTONE) 25 MG tablet TAKE 1 TABLET(25 MG) BY MOUTH DAILY 05/01/22   Duke Salvia, MD  sucralfate (CARAFATE) 1 g tablet Take 1 g by mouth 2 (two) times daily.    [provider]  valsartan (DIOVAN) 40 MG tablet Take 1 tablet (40 mg total) by mouth daily. Patient taking differently: Take 10 mg by mouth daily. 07/24/22   Wanda Plump, MD      Allergies    Ibuprofen, Lactose intolerance (gi), Lisinopril, Nsaids, Tape, Tolmetin, Zoster vaccine live, Amoxicillin, and Penicillins    Review of Systems   Review of Systems  Constitutional:  Negative for appetite change and fatigue.  HENT:  Negative for congestion, ear discharge and sinus pressure.   Eyes:  Negative for discharge.  Respiratory:  Negative for cough.   Cardiovascular:  Negative for chest pain.  Gastrointestinal:  Negative for abdominal pain and diarrhea.  Genitourinary:  Negative for frequency and hematuria.  Musculoskeletal:  Negative for back pain.       Redness and tenderness to left lower leg  Skin:  Positive for rash.  Neurological:  Negative for seizures and headaches.  Psychiatric/Behavioral:  Negative for hallucinations.     Physical Exam Updated Vital Signs BP (!) 173/66 (BP Location: Left Arm)   Pulse 65   Temp (!) 97.5 F (36.4 C) (Oral)   Resp 18   SpO2 100%  Physical Exam Vitals and nursing note reviewed.  Constitutional:      Appearance: She is well-developed.  HENT:     Head: Normocephalic.     Nose: Nose normal.  Eyes:     General: No scleral icterus.    Conjunctiva/sclera: Conjunctivae normal.  Neck:     Thyroid: No thyromegaly.  Cardiovascular:     Rate and Rhythm: Normal rate and regular rhythm.     Heart sounds: No murmur heard.    No friction rub. No gallop.  Pulmonary:      Breath sounds: No stridor. No wheezing or rales.  Chest:     Chest wall: No tenderness.  Abdominal:     General: There is no distension.     Tenderness: There is no abdominal tenderness. There is no rebound.  Musculoskeletal:        General: Normal range of motion.     Cervical back: Neck supple.     Comments: Mild tenderness to thoracic spine  Lymphadenopathy:     Cervical: No cervical adenopathy.  Skin:    Findings: Rash present. No erythema.  Comments: Cellulitis to left lower leg  Neurological:     Mental Status: She is alert and oriented to person, place, and time.     Motor: No abnormal muscle tone.     Coordination: Coordination normal.  Psychiatric:        Behavior: Behavior normal.     ED Results / Procedures / Treatments   Labs (all labs ordered are listed, but only abnormal results are displayed) Labs Reviewed  COMPREHENSIVE METABOLIC PANEL - Abnormal; Notable for the following components:      Result Value   Glucose, Bld 104 (*)    All other components within normal limits  URINALYSIS, ROUTINE W REFLEX MICROSCOPIC - Abnormal; Notable for the following components:   Color, Urine STRAW (*)    Specific Gravity, Urine 1.002 (*)    All other components within normal limits  CBC WITH DIFFERENTIAL/PLATELET    EKG None  Radiology No results found.  Procedures Procedures    Medications Ordered in ED Medications - No data to display  ED Course/ Medical Decision Making/ A&P                                 Medical Decision Making Amount and/or Complexity of Data Reviewed Labs: ordered. Radiology: ordered.  Risk Decision regarding hospitalization.  This patient presents to the ED for concern of rash to her right lower leg, this involves an extensive number of treatment options, and is a complaint that carries with it a high risk of complications and morbidity.  The differential diagnosis includes phlebitis, cellulitis   Co morbidities that  complicate the patient evaluation  Hypertension and congestive heart failure   Additional history obtained:  Additional history obtained from patient External records from outside source obtained and reviewed including hospital records   Lab Tests:  I Ordered, and personally interpreted labs.  The pertinent results include: White count 7.9   Imaging Studies ordered:  I ordered imaging studies including chest x-ray and thoracic spine I independently visualized and interpreted imaging which showed degenerative changes in spine I agree with the radiologist interpretation   Cardiac Monitoring: / EKG:  The patient was maintained on a cardiac monitor.  I personally viewed and interpreted the cardiac monitored which showed an underlying rhythm of: Normal sinus rhythm   Consultations Obtained:  I requested consultation with the hospital with,  and discussed lab and imaging findings as well as pertinent plan - they recommend: Admit at   Problem List / ED Course / Critical interventions / Medication management  Hypertension and congestive heart failure and cellulitis I ordered medication including vancomycin for cellulitis Reevaluation of the patient after these medicines showed that the patient stayed the same I have reviewed the patients home medicines and have made adjustments as needed   Social Determinants of Health:  None   Test / Admission - Considered:  None  Patient with cellulitis to left lower leg that is failed outpatient treatment with doxycycline.  She is given vancomycin here and will be admitted for IV antibiotics        Final Clinical Impression(s) / ED Diagnoses Final diagnoses:  Leg swelling  Cellulitis of left lower leg    Rx / DC Orders ED Discharge Orders     None         Bethann Berkshire, MD 12/04/22 1028

## 2022-11-29 NOTE — Progress Notes (Addendum)
Pharmacy Antibiotic Note  MARICZA BHOLA is a 77 y.o. female admitted on 11/29/2022 with cellulitis.  Pharmacy has been consulted for vancomycin dosing.  Plan: Vancomycin 2000 mg (1000 + 1000 mg) IV now, then 1500 mg IV q24 hr (est AUC 478 based on SCr 0.8; Vd 0.5) Measure vancomycin AUC at steady state as indicated SCr q48 while on vanc   Height: 5\' 7"  (170.2 cm) Weight: 115.7 kg (255 lb) IBW/kg (Calculated) : 61.6  Temp (24hrs), Avg:97.9 F (36.6 C), Min:97.5 F (36.4 C), Max:98.5 F (36.9 C)  Recent Labs  Lab 11/29/22 1235  WBC 7.9  CREATININE 0.78    Estimated Creatinine Clearance: 81 mL/min (by C-G formula based on SCr of 0.78 mg/dL).    Allergies  Allergen Reactions   Ibuprofen Other (See Comments)    Pt is on Xarelto   Lactose Intolerance (Gi) Diarrhea and Nausea And Vomiting   Lisinopril Cough   Nsaids Other (See Comments)    Pt on Xarelto   Tape     Causes blisters    Tolmetin Other (See Comments)    Pt on Xarelto   Zoster Vaccine Live Other (See Comments)    Serum sickness, high fever, neck pain    Amoxicillin Rash   Penicillins Rash    Local reaction    Antimicrobials this admission: 11/27 vancomycin >>  11/27 ceftriaxone >>   Dose adjustments this admission: N/a  Microbiology results: No Cx obtained   Thank you for allowing pharmacy to be a part of this patient's care.  Aquil Duhe A 11/29/2022 6:43 PM

## 2022-11-29 NOTE — Plan of Care (Signed)

## 2022-11-29 NOTE — Progress Notes (Signed)
   11/29/22 2300  BiPAP/CPAP/SIPAP  BiPAP/CPAP/SIPAP Pt Type Adult  BiPAP/CPAP/SIPAP Resmed  FiO2 (%) 21 %  Flow Rate 0 lpm  Patient Home Equipment Yes  Auto Titrate Yes  Safety Check Completed by RT for Home Unit Yes, no issues noted   Pt. has own CPAP for use, currently on Room air, made aware to notify if needed.

## 2022-11-29 NOTE — ED Triage Notes (Addendum)
Patient here from home reporting left leg pain with redness and swelling. Seen for same and prescribed doxycycline with no relief. Xarelto for A-Fib.  Tylenol at 11am today.

## 2022-11-29 NOTE — ED Notes (Signed)
ED TO INPATIENT HANDOFF REPORT  ED Nurse Name and Phone #:  Mellody Dance  [phone]  S Name/Age/Gender Veronica Bradford 77 y.o. female Room/Bed: WA19/WA19  Code Status   Code Status: Prior  Home/SNF/Other Home Patient oriented to: self, place, time, and situation Is this baseline? Yes   Triage Complete: Triage complete  Chief Complaint Cellulitis of left lower extremity [L03.116]  Triage Note Patient here from home reporting left leg pain with redness and swelling. Seen for same and prescribed doxycycline with no relief. Xarelto for A-Fib.  Tylenol at 11am today.    Allergies Allergies  Allergen Reactions   Ibuprofen Other (See Comments)    Pt is on Xarelto   Lactose Intolerance (Gi) Diarrhea and Nausea And Vomiting   Lisinopril Cough   Nsaids Other (See Comments)    Pt on Xarelto   Tape     Causes blisters    Tolmetin Other (See Comments)    Pt on Xarelto   Zoster Vaccine Live Other (See Comments)    Serum sickness, high fever, neck pain    Amoxicillin Rash   Penicillins Rash    Local reaction    Level of Care/Admitting Diagnosis ED Disposition     ED Disposition  Admit   Condition  --   Comment  [facility] [[id number]] Level of Care: Med-Surg [16] May admit patient to [facility] or [facility] if equivalent level of care is available:: No Covid Evaluation: Asymptomatic - no recent exposure (last 10 days) tes ting not required Diagnosis: Cellulitis of left lower extremity [846962] Admitting Physician: Bobette Mo [[id number]] Attending Physician: Bobette Mo [[id number]] Certification:: I certify this patient will need inpatient services for  at least 2 midnights Expected Medical Readiness: 12/01/2022          B Medical/Surgery History Past Medical History:  Diagnosis Date   Atrial fibrillation (HCC)    Atrial fibrillation (HCC)    Back pain    CHF (congestive heart failure) (HCC)    grade 2 HF   Chronic  sinusitis    s/p 2 surgeries remotely, Dr Ezzard Standing   Closed fracture of metatarsal of left foot    L foot, fifth metatarsal   Complication of anesthesia    woke up once during procedure   Dysrhythmia    a-fib   Ectopic pregnancy    Endometrial cancer (HCC) 2004   Early diagnosis   GERD (gastroesophageal reflux disease)    Hyperlipemia    Hypertension    IBS (irritable bowel syndrome) 08/13/2014   Dx 2015, diarrhea on-off, Dr Matthias Hughs    Lumbar radiculopathy 08/2012   MRI in 08/2012   Mild hyperparathyroidism (HCC)    Osteopenia    PAF (paroxysmal atrial fibrillation) (HCC)    Presence of permanent cardiac pacemaker    Sciatica of right side 08/2013   Received Depomedrol 80 mg injection   Sleep apnea 09/09/2013   Dx with OSA in 2015, by Dr. Graciela Husbands,   wears CPAPAPAP   Past Surgical History:  Procedure Laterality Date   ATRIAL FIBRILLATION ABLATION N/A 09/04/2017   Procedure: ATRIAL FIBRILLATION ABLATION;  Surgeon: Hillis Range, MD;  Location: [facility] INVASIVE CV LAB;  Service: Cardiovascular;  Laterality: N/A;   BIOPSY  03/11/2020   Procedure: BIOPSY;  Surgeon: Bernette Redbird, MD;  Location: [facility] ENDOS[facility];  Service: Endoscopy;;  EGD and COLON   CARPAL TUNNEL RELEASE Right 06/17/2019   CARPAL TUNNEL RELEASE Left 11/2020   CHOLECYSTECTOMY  ~  2006   COLONOSCOPY WITH PROPOFOL N/A 03/11/2020   Procedure: COLONOSCOPY WITH PROPOFOL;  Surgeon: Bernette Redbird, MD;  Location: [facility] END[facility];  Service: Endoscopy;  Laterality: N/A;   DILATION AND CURETTAGE OF UTERUS     after SAB   ESOPHAGOGASTRODUODENOSCOPY  02/09/2014   Dr. Matthias Hughs   ESOPHAGOGASTRODUODENOSCOPY (EGD) WITH PROPOFOL N/A 03/11/2020   Procedure: ESOPHAGOGASTRODUODENOSCOPY (EGD) WITH PROPOFOL;  Surgeon: Bernette Redbird, MD;  Location: [facility];  Service: Endoscopy;  Laterality: N/A;   FINGER SURGERY Left    index   NASAL SINUS SURGERY     x 2 remotely, Dr Ezzard Standing   PACEMAKER IMPLANT N/A 09/13/2020   Procedure: PACEMAKER  IMPLANT;  Surgeon: Duke Salvia, MD;  Location: [facility] INVASIVE CV LAB;  Service: Cardiovascular;  Laterality: N/A;   PARATHYROIDECTOMY Left 09/14/2022   Procedure: LEFT PARATHYROIDECTOMY;  Surgeon: Darnell Level, MD;  Location: [facility] ORS;  Service: General;  Laterality: Left;   TONSILLECTOMY     TOTAL ABDOMINAL HYSTERECTOMY W/ BILATERAL SALPINGOOPHORECTOMY     TOTAL HIP ARTHROPLASTY Left 07/18/2021   Procedure: TOTAL HIP ARTHROPLASTY ANTERIOR APPROACH;  Surgeon: Samson Frederic, MD;  Location: [facility] ORS;  Service: Orthopedics;  Laterality: Left;   TOTAL KNEE ARTHROPLASTY Right ~2008     A IV Location/Drains/Wounds Patient Lines/Drains/Airways Status     Active Line/Drains/Airways     Name Placement date Placement time Site Days   Peripheral IV 11/29/22 20 G Right Antecubital 11/29/22  1250  Antecubital  less than 1            Intake/Output Last 24 hours  Intake/Output Summary (Last 24 hours) at 11/29/2022 1516 Last data filed at 11/29/2022 1415 Gross per 24 hour  Intake 200 ml  Output --  Net 200 ml    Labs/Imaging Results for orders placed or performed during the hospital encounter of 11/29/22 (from the past 48 hour(s))  CBC with Differential     Status: None   Collection Time: 11/29/22 12:35 PM  Result Value Ref Range   WBC 7.9 4.0 - 10.5 K/uL   RBC 4.92 3.87 - 5.11 MIL/uL   Hemoglobin 14.2 12.0 - 15.0 g/dL   HCT 54.0 98.1 - 19.1 %   MCV 91.5 80.0 - 100.0 fL   MCH 28.9 26.0 - 34.0 pg   MCHC 31.6 30.0 - 36.0 g/dL   RDW 47.8 29.5 - 62.1 %   Platelets 266 150 - 400 K/uL   nRBC 0.0 0.0 - 0.2 %   Neutrophils Relative % 75 %   Neutro Abs 5.9 1.7 - 7.7 K/uL   Lymphocytes Relative 16 %   Lymphs Abs 1.3 0.7 - 4.0 K/uL   Monocytes Relative 7 %   Monocytes Absolute 0.6 0.1 - 1.0 K/uL   Eosinophils Relative 1 %   Eosinophils Absolute 0.1 0.0 - 0.5 K/uL   Basophils Relative 1 %   Basophils Absolute 0.1 0.0 - 0.1 K/uL   Immature Granulocytes 0 %   Abs Immature Granulocytes  0.02 0.00 - 0.07 K/uL    Comment: Performed at [facility], [address], [place], [place] [address]  Comprehensive metabolic panel     Status: Abnormal   Collection Time: 11/29/22 12:35 PM  Result Value Ref Range   Sodium 139 135 - 145 mmol/L   Potassium 3.5 3.5 - 5.1 mmol/L   Chloride 102 98 - 111 mmol/L   CO2 26 22 - 32 mmol/L   Glucose, Bld 104 (H) 70 - 99 mg/dL  Comment: Glucose reference range applies only to samples taken after fasting for at least 8 hours.   BUN 17 8 - 23 mg/dL   Creatinine, Ser 0.45 0.44 - 1.00 mg/dL   Calcium 9.6 8.9 - 40.9 mg/dL   Total Protein 7.7 6.5 - 8.1 g/dL   Albumin 4.3 3.5 - 5.0 g/dL   AST 29 15 - 41 U/L   ALT 21 0 - 44 U/L   Alkaline Phosphatase 71 38 - 126 U/L   Total Bilirubin 0.9 <1.2 mg/dL   GFR, Estimated >81 >19 mL/min    Comment: (NOTE) Calculated using the CKD-EPI Creatinine Equation (2021)    Anion gap 11 5 - 15    Comment: Performed at [facility], [address], [place], [place] [address]  Urinalysis, Routine w reflex microscopic -Urine, Clean Catch     Status: Abnormal   Collection Time: 11/29/22 12:35 PM  Result Value Ref Range   Color, Urine STRAW (A) YELLOW   APPearance CLEAR CLEAR   Specific Gravity, Urine 1.002 (L) 1.005 - 1.030   pH 6.0 5.0 - 8.0   Glucose, UA NEGATIVE NEGATIVE mg/dL   Hgb urine dipstick NEGATIVE NEGATIVE   Bilirubin Urine NEGATIVE NEGATIVE   Ketones, ur NEGATIVE NEGATIVE mg/dL   Protein, ur NEGATIVE NEGATIVE mg/dL   Nitrite NEGATIVE NEGATIVE   Leukocytes,Ua NEGATIVE NEGATIVE   RBC / HPF 0-5 0 - 5 RBC/hpf   WBC, UA 0-5 0 - 5 WBC/hpf   Bacteria, UA NONE SEEN NONE SEEN   Squamous Epithelial / HPF 0-5 0 - 5 /HPF    Comment: Performed at [facility], [address] [place], [place] [address]   No results found.  Pending Labs Unresulted Labs (From admission, onward)    None       Vitals/Pain Today's Vitals   11/29/22 1153  11/29/22 1206  BP: (!) 173/66   Pulse: 65   Resp: 18   Temp: (!) 97.5 F (36.4 C)   TempSrc: Oral   SpO2: 100%   PainSc:  10-Worst pain ever    Isolation Precautions No active isolations  Medications Medications - No data to display  Mobility walks     Focused Assessments    R Recommendations: See Admitting Provider Note  Report given to:   Additional Notes:

## 2022-11-29 NOTE — H&P (Signed)
History and Physical    Patient: Veronica Bradford UVO:[id number] DOB: 1945/05/29 DOA: 11/29/2022 DOS: the patient was seen and examined on 11/29/2022 PCP: Wanda Plump, MD  Patient coming from: Home  Chief Complaint:  Chief Complaint  Patient presents with   Leg Swelling   Cellulitis   HPI: Veronica Bradford is a 77 y.o. female with medical history significant of paroxysmal atrial fibrillation on rivaroxaban, grade 2 diastolic dysfunction, chronic sinusitis, history of sinus surgeries x 2, left foot fracture, ectopic pregnancy, endometrial cancer, GERD, hyperlipidemia, hypertension, IBS, lumbar radiculopathy, mild hyperparathyroidism, osteopenia, sleep apnea on CPAP who presented to the emergency department due to progressively worse left lower extremity tenderness with edema and erythema.  Per patient, about 2+ weeks ago she had a few varicose veins treated by vascular with injections in her LLE.  She had an accidental fall landing on both lower her needs couple days after that and developed bilateral pretibial ecchymosis over the next few days.  However, she has noticed the redness and tenderness have become significantly worse in the last 2 days.  She spoke to her doctor who gave her doxycycline yesterday without any positive results.  Also, about a month ago, she had IM Rocephin followed by oral cephalexin for 5 days for UTI.  She denied fever, chills, rhinorrhea, sore throat, wheezing or hemoptysis.  No chest pain, palpitations, diaphoresis, PND, orthopnea or pitting edema of the lower extremities.  No abdominal pain, nausea, emesis, diarrhea, constipation, melena or hematochezia.  No flank pain, dysuria, frequency or hematuria.  No polyuria, polydipsia, polyphagia or blurred vision.   Lab work: Her urine analysis showed specific gravity of 1.002, but was otherwise unremarkable.  CBC showed a white count of 7.9, hemoglobin 14.2 g/dL platelets 742.  CMP showed a glucose of 104 mg deciliter but was  otherwise unremarkable.  Imaging: A 2 view chest radiograph showed no active cardiopulmonary disease.  Normal cardiomediastinal silhouette. Thoracic spine show mild multilevel degenerative changes, but no acute osseous abnormality.   ED course: Initial vital signs were temperature 97.5 F, pulse 65, respiration 18, BP 173/66 mmHg O2 sat 100% on room air.  Review of Systems: As mentioned in the history of present illness. All other systems reviewed and are negative. Past Medical History:  Diagnosis Date   Atrial fibrillation (HCC)    Atrial fibrillation (HCC)    Back pain    CHF (congestive heart failure) (HCC)    grade 2 HF   Chronic sinusitis    s/p 2 surgeries remotely, Dr Ezzard Standing   Closed fracture of metatarsal of left foot    L foot, fifth metatarsal   Complication of anesthesia    woke up once during procedure   Dysrhythmia    a-fib   Ectopic pregnancy    Endometrial cancer (HCC) 2004   Early diagnosis   GERD (gastroesophageal reflux disease)    Hyperlipemia    Hypertension    IBS (irritable bowel syndrome) 08/13/2014   Dx 2015, diarrhea on-off, Dr Matthias Hughs    Lumbar radiculopathy 08/2012   MRI in 08/2012   Mild hyperparathyroidism (HCC)    Osteopenia    PAF (paroxysmal atrial fibrillation) (HCC)    Presence of permanent cardiac pacemaker    Sciatica of right side 08/2013   Received Depomedrol 80 mg injection   Sleep apnea 09/09/2013   Dx with OSA in 2015, by Dr. Graciela Husbands,   wears CPAPAPAP   Past Surgical History:  Procedure Laterality Date   ATRIAL  FIBRILLATION ABLATION N/A 09/04/2017   Procedure: ATRIAL FIBRILLATION ABLATION;  Surgeon: Hillis Range, MD;  Location: [facility];  Service: Cardiovascular;  Laterality: N/A;   BIOPSY  03/11/2020   Procedure: BIOPSY;  Surgeon: Bernette Redbird, MD;  Location: [facility];  Service: Endoscopy;;  EGD and COLON   CARPAL TUNNEL RELEASE Right 06/17/2019   CARPAL TUNNEL RELEASE Left 11/2020   CHOLECYSTECTOMY  ~2006    COLONOSCOPY WITH PROPOFOL N/A 03/11/2020   Procedure: COLONOSCOPY WITH PROPOFOL;  Surgeon: Bernette Redbird, MD;  Location: [facility];  Service: Endoscopy;  Laterality: N/A;   DILATION AND CURETTAGE OF UTERUS     after SAB   ESOPHAGOGASTRODUODENOSCOPY  02/09/2014   Dr. Matthias Hughs   ESOPHAGOGASTRODUODENOSCOPY (EGD) WITH PROPOFOL N/A 03/11/2020   Procedure: ESOPHAGOGASTRODUODENOSCOPY (EGD) WITH PROPOFOL;  Surgeon: Bernette Redbird, MD;  Location: [facility];  Service: Endoscopy;  Laterality: N/A;   FINGER SURGERY Left    index   NASAL SINUS SURGERY     x 2 remotely, Dr Ezzard Standing   PACEMAKER IMPLANT N/A 09/13/2020   Procedure: PACEMAKER IMPLANT;  Surgeon: Duke Salvia, MD;  Location: [facility]VAS[facility] CV LAB;  Service: Cardiovascular;  Laterality: N/A;   PARATHYROIDECTOMY Left 09/14/2022   Procedure: LEFT PARATHYROIDECTOMY;  Surgeon: Darnell Level, MD;  Location: [facility];  Service: General;  Laterality: Left;   TONSILLECTOMY     TOTAL ABDOMINAL HYSTERECTOMY W/ BILATERAL SALPINGOOPHORECTOMY     TOTAL HIP ARTHROPLASTY Left 07/18/2021   Procedure: TOTAL HIP ARTHROPLASTY ANTERIOR APPROACH;  Surgeon: Samson Frederic, MD;  Location: [facility];  Service: Orthopedics;  Laterality: Left;   TOTAL KNEE ARTHROPLASTY Right ~2008   Social History:  reports that she quit smoking about 42 years ago. Her smoking use included cigarettes. She has never used smokeless tobacco. She reports current alcohol use. She reports that she does not use drugs.  Allergies  Allergen Reactions   Ibuprofen Other (See Comments)    Pt is on Xarelto   Lactose Intolerance (Gi) Diarrhea and Nausea And Vomiting   Lisinopril Cough   Nsaids Other (See Comments)    Pt on Xarelto   Tape     Causes blisters    Tolmetin Other (See Comments)    Pt on Xarelto   Zoster Vaccine Live Other (See Comments)    Serum sickness, high fever, neck pain    Amoxicillin Rash   Penicillins Rash    Local reaction    Family History  Problem Relation  Age of Onset   Hypertension Mother    Colon cancer Mother    Breast cancer Mother    Ovarian cancer Mother    Hypertension Father    Diabetes Father    Head & neck cancer Sister    CAD Neg Hx     Prior to Admission medications   Medication Sig Start Date End Date Taking? Authorizing Provider  acetaminophen (TYLENOL) 500 MG tablet Take 500 mg by mouth every 6 (six) hours as needed for moderate pain or headache.    [provider]  acetaminophen-codeine (TYLENOL #3) 300-30 MG tablet TAKE 1 TABLET BY MOUTH AT BEDTIME AS NEEDED FOR MODERATE PAIN 11/24/22   Wanda Plump, MD  ARTIFICIAL TEAR SOLUTION OP Place 1 drop into both eyes 4 (four) times daily as needed (dry/irritated eyes.).    [provider]  Cholecalciferol (VITAMIN D) 50 MCG (2000 UT) tablet Take 2,000 Units by mouth in the morning.    [provider]  Chromium Picolinate 500 MCG  TABS Take 500 mcg by mouth daily after supper.    [provider]  Coenzyme Q10 (CO Q 10) 100 MG CAPS Take 100 mg by mouth daily.    [provider]  colestipol (COLESTID) 1 g tablet Take 1 g by mouth 2 (two) times daily as needed. 02/03/22   [provider]  CRANBERRY EXTRACT PO Take 2 tablets by mouth at bedtime.    [provider]  diltiazem (CARDIZEM) 30 MG tablet Take 1 tablet every 4 hours AS NEEDED for heart rate >100 as long as blood pressure >100. Patient taking differently: Take 30 mg by mouth every 4 (four) hours. Take 1 tablet every 4 hours AS NEEDED for heart rate >100 as long as blood pressure >100. Take every 4 hours until next daily dose. 01/27/21   Newman Nip, NP  dofetilide (TIKOSYN) 250 MCG capsule Take 1 capsule (250 mcg total) by mouth 2 (two) times daily. 07/31/22   Fenton, Clint R, PA  estradiol (ESTRACE) 0.1 MG/GM vaginal cream Place 1 Applicatorful vaginally 3 (three) times a week. 11/10/22   Seabron Spates R, DO  ferrous fumarate (HEMOCYTE - 106 MG FE) 325 (106 Fe)  MG TABS tablet Take 1 tablet (106 mg of iron total) by mouth 2 (two) times daily. Take apart from pantoprazole 10/11/22   Paz, Nolon Rod, MD  fluticasone [facility]) 50 MCG/ACT nasal spray Place 1 spray into both nostrils daily.    [provider]  folic acid (FOLVITE) 800 MCG tablet Take 800 mcg by mouth in the morning.    [provider]  furosemide (LASIX) 40 MG tablet Take 1 tablet (40 mg total) by mouth as needed. 04/03/22   Duke Salvia, MD  MAGNESIUM GLYCINATE PO Take 240 mg by mouth daily.    [provider]  olopatadine (PATANOL) 0.1 % ophthalmic solution Place 1 drop into both eyes daily as needed (allergies).    [provider]  pantoprazole (PROTONIX) 40 MG tablet Take 1 tablet (40 mg total) by mouth 2 (two) times daily. 01/27/21   Newman Nip, NP  phenazopyridine (PYRIDIUM) 200 MG tablet Take 1 tablet (200 mg total) by mouth 3 (three) times daily as needed for pain. 11/10/22   Seabron Spates R, DO  potassium chloride SA (KLOR-CON M) 20 MEQ tablet Take 1 tablet (20 mEq total) by mouth daily. 06/23/22   Duke Salvia, MD  pravastatin (PRAVACHOL) 40 MG tablet Take 1 tablet (40 mg total) by mouth every evening. 11/28/22   Duke Salvia, MD  rivaroxaban (XARELTO) 20 MG TABS tablet Take 1 tablet (20 mg total) by mouth daily with supper. 08/07/22   Graciella Freer, PA-C  spironolactone (ALDACTONE) 25 MG tablet TAKE 1 TABLET(25 MG) BY MOUTH DAILY 05/01/22   Duke Salvia, MD  sucralfate (CARAFATE) 1 g tablet Take 1 g by mouth 2 (two) times daily.    [provider]  valsartan (DIOVAN) 40 MG tablet Take 1 tablet (40 mg total) by mouth daily. Patient taking differently: Take 10 mg by mouth daily. 07/24/22   Wanda Plump, MD    Physical Exam: Vitals:   11/29/22 1153  BP: (!) 173/66  Pulse: 65  Resp: 18  Temp: (!) 97.5 F (36.4 C)  TempSrc: Oral  SpO2: 100%   Physical Exam Vitals and nursing note reviewed.  Constitutional:       General: She is awake. She is not in acute distress.  Appearance: Normal appearance. She is obese. She is ill-appearing.  HENT:     Head: Normocephalic.     Nose: No rhinorrhea.     Mouth/Throat:     Mouth: Mucous membranes are moist.  Eyes:     General: No scleral icterus.    Pupils: Pupils are equal, round, and reactive to light.  Neck:     Vascular: No JVD.  Cardiovascular:     Rate and Rhythm: Normal rate and regular rhythm.     Heart sounds: S1 normal and S2 normal.  Pulmonary:     Effort: Pulmonary effort is normal.     Breath sounds: Normal breath sounds. No wheezing, rhonchi or rales.  Abdominal:     General: Bowel sounds are normal. There is no distension.     Palpations: Abdomen is soft.     Tenderness: There is no abdominal tenderness. There is no guarding.  Musculoskeletal:     Cervical back: Neck supple.     Right lower leg: No edema.     Left lower leg: No edema.  Skin:    General: Skin is warm and dry.     Findings: Ecchymosis, erythema and rash present.     Comments: Left pretibial erythema, edema with calor and TTP. Right pretibial area ecchymosis. See pictures below.  Neurological:     General: No focal deficit present.     Mental Status: She is alert and oriented to person, place, and time.  Psychiatric:        Mood and Affect: Mood normal.        Behavior: Behavior normal. Behavior is cooperative.        Data Reviewed:  Results are pending, will review when available.  Assessment and Plan: Principal Problem:   Cellulitis of left lower extremity Admit to MedSurg/inpatient. Begin ceftriaxone 2 g IVPB daily. Vancomycin per pharmacy. Analgesics as needed. Follow CBC and CMP in a.m.  Active Problems:   HTN (hypertension) On furosemide 40 mg p.o. daily. On the spironolactone 25 mg p.o. daily.    Diastolic dysfunction-grade 2   Chronic diastolic CHF (congestive heart failure) (HCC) No signs of volume overload. Continue diuretics as  above. Also taking valsartan 10 mg daily. -Unable to get equivalent dose.    Paroxysmal atrial fibrillation (HCC) CHA?DS?-VASc Score of at least 5. 1 for age, 1 for gender, 1 for CHF Hx 1 for HTN Hx and 1 for vascular disease (CT coronaries) Continue rivaroxaban 20 mg every evening. Continue dofetilide 250 mcg twice daily. Keep electrolytes optimized.    Dyslipidemia Continue pravastatin 40 mg p.o. daily.    Obesity (BMI 30-39.9)   Class 2 obesity Current BMI 39.94 kg/m. Lifestyle modifications. Follow-up with closely PCP as an outpatient.    OSA on CPAP Continue CPAP at bedtime.    GERD (gastroesophageal reflux disease) Continue pantoprazole 40 mg p.o. before dinner.     Advance Care Planning:   Code Status: Full Code   Consults:   Family Communication: Her husband was at bedside.  Severity of Illness: The appropriate patient status for this patient is INPATIENT. Inpatient status is judged to be reasonable and necessary in order to provide the required intensity of service to ensure the patient's safety. The patient's presenting symptoms, physical exam findings, and initial radiographic and laboratory data in the context of their chronic comorbidities is felt to place them at high risk for further clinical deterioration. Furthermore, it is not anticipated that the patient will be medically stable for discharge  from the hospital within 2 midnights of admission.   * I certify that at the point of admission it is my clinical judgment that the patient will require inpatient hospital care spanning beyond 2 midnights from the point of admission due to high intensity of service, high risk for further deterioration and high frequency of surveillance required.*  Author: Bobette Mo, MD 11/29/2022 3:05 PM  For on call review [email or web address].   This document was prepared using Dragon voice recognition software and may contain some unintended transcription errors.

## 2022-11-29 NOTE — Progress Notes (Signed)
Pt reported that she takes Valsartan at 2200. Per Robb Matar, MD notes no equivalent dose available. Pt said her husband can bring her medication tomorrow and will ask the MD if he will allow it. Pt said that she cannot missed any dose or else she will have high blood pressure.

## 2022-11-30 LAB — COMPREHENSIVE METABOLIC PANEL
ALT: 18 U/L (ref 0–44)
AST: 22 U/L (ref 15–41)
Albumin: 3.4 g/dL — ABNORMAL LOW (ref 3.5–5.0)
Alkaline Phosphatase: 53 U/L (ref 38–126)
Anion gap: 8 (ref 5–15)
BUN: 17 mg/dL (ref 8–23)
CO2: 22 mmol/L (ref 22–32)
Calcium: 8.8 mg/dL — ABNORMAL LOW (ref 8.9–10.3)
Chloride: 106 mmol/L (ref 98–111)
Creatinine, Ser: 0.72 mg/dL (ref 0.44–1.00)
GFR, Estimated: >60 mL/min (ref >60)
Glucose, Bld: 103 mg/dL — ABNORMAL HIGH (ref 70–99)
Potassium: 3.7 mmol/L (ref 3.5–5.1)
Sodium: 136 mmol/L (ref 135–145)
Total Bilirubin: 0.6 mg/dL (ref <1.2)
Total Protein: 6.1 g/dL — ABNORMAL LOW (ref 6.5–8.1)

## 2022-11-30 LAB — CBC
HCT: 39.9 % (ref 36.0–46.0)
Hemoglobin: 12.6 g/dL (ref 12.0–15.0)
MCH: 29 pg (ref 26.0–34.0)
MCHC: 31.6 g/dL (ref 30.0–36.0)
MCV: 91.9 fL (ref 80.0–100.0)
Platelets: 223 10*3/uL (ref 150–400)
RBC: 4.34 MIL/uL (ref 3.87–5.11)
RDW: 14.7 % (ref 11.5–15.5)
WBC: 5.7 10*3/uL (ref 4.0–10.5)
nRBC: 0 % (ref 0.0–0.2)

## 2022-11-30 LAB — MRSA NEXT GEN BY PCR, NASAL: MRSA by PCR Next Gen: NOT DETECTED

## 2022-11-30 MED ORDER — FUROSEMIDE 40 MG PO TABS
40.0000 mg | ORAL_TABLET | Freq: Two times a day (BID) | ORAL | Status: DC
  Administered 2022-11-30 – 2022-12-01 (×2): 40 mg via ORAL
  Filled 2022-11-30 (×2): qty 1

## 2022-11-30 MED ORDER — FUROSEMIDE 40 MG PO TABS
40.0000 mg | ORAL_TABLET | Freq: Every day | ORAL | Status: DC
  Administered 2022-11-30: 40 mg via ORAL
  Filled 2022-11-30: qty 1

## 2022-11-30 MED ORDER — NONFORMULARY OR COMPOUNDED ITEM
10.0000 mg | Freq: Every day | Status: DC
  Administered 2022-11-30 – 2022-12-01 (×2): 10 mg via ORAL
  Filled 2022-11-30 (×4): qty 1

## 2022-11-30 MED ORDER — ENSURE MAX PROTEIN PO LIQD
11.0000 [oz_av] | Freq: Every day | ORAL | Status: DC
  Administered 2022-11-30 – 2022-12-02 (×3): 11 [oz_av] via ORAL
  Filled 2022-11-30 (×3): qty 330

## 2022-11-30 MED ORDER — FUROSEMIDE 8 MG/ML PO SOLN
40.0000 mg | Freq: Two times a day (BID) | ORAL | Status: DC
  Filled 2022-11-30 (×4): qty 5

## 2022-11-30 NOTE — Progress Notes (Signed)
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  Patient reports 2 weeks prior she had ejections for varicose vein treatment in vascular clinic.  She reports shortly after treatment she sustained a fall landing on her knees and subsequently developed bilateral pretibial ecchymosis.  She reports that the erythema and tenderness around this area has become significantly worse so she called her doctor who prescribed doxycycline, however erythema persisted and patient developed malaise.  She then presented to the ED.  On arrival to the ED labs were grossly unremarkable.  Chest x-ray without any active cardiopulmonary process.  She was admitted for treatment of left lower extremity cellulitis (Of note, she has also recently had IM Rocephin followed by oral cephalexin for 5 days for UTI 1 month ago)  Subjective: On evaluation today patient does endorse feeling improved levels of energy today.  She reports lower extremity erythema is improving some but edema persists.  Patient also reports she has not had profound urinary output.  She is concerned about some of her labs including hypocalcemia and hypoalbuminemia.    Objective: Vitals:   11/29/22 1737 11/29/22 2200 11/30/22 0157 11/30/22 0555  BP: (!) 144/50 (!) 142/53 (!) 158/73 (!) 146/57  Pulse: 65 64 63 62  Resp:  16 16 16   Temp: 98.5 F (36.9 C) 97.8 F (36.6 C) 97.8 F (36.6 C) 97.6  F (36.4 C)  TempSrc:   Oral Oral  SpO2: 100% 99% 100% 98%  Weight:      Height:        Intake/Output Summary (Last 24 hours) at 11/30/2022 0749 Last data filed at 11/30/2022 0400 Gross per 24 hour  Intake 740 ml  Output --  Net 740 ml   Filed Weights   11/29/22 1731  Weight: 115.7 kg    Examination: General exam: Appears calm and comfortable, NAD Respiratory system: No work of breathing, symmetric chest wall expansion Cardiovascular system: S1 & S2 heard, RRR.  Gastrointestinal system: Abdomen is nondistended, soft and nontender.  Neuro: Alert and oriented. No focal neurological deficits. Extremities: wearing TED hose.  2+ pitting edema bilaterally, left lower extremity with tenderness to palpation, erythema circumferentially around left calf extending distally down the ankle. Psychiatry: Demonstrates appropriate judgement and insight. Mood & affect appropriate for situation.   Assessment & Plan:  Principal Problem:   Cellulitis of left lower extremity Active Problems:   HTN (hypertension)   Diastolic dysfunction-grade 2   Dyslipidemia   Obesity (BMI 30-39.9)   OSA on CPAP   GERD (gastroesophageal reflux disease)   Paroxysmal atrial fibrillation (HCC)   Chronic diastolic CHF (congestive heart failure) (HCC)   Class 2 obesity  Cellulitis of left lower extremity -  Afebrile.  No leukocytosis - Continue with ceftriaxone. Was on vancomycin, MRSA PCR neg. Dcd vanc. Will transition to PO tomorrow if remaining stable.   - Pain management as needed - Follow CBC  Ecchymosis bilateral lower extremities - Secondary to mechanical fall knees - Healing  appropriately.  No evidence of hematoma formation  Hypertension - Continue home meds  Chronic diastolic heart failure, grade 2 diastolic dysfunction - 2+ pitting edema bilaterally. - Increase Lasix today, for the next 2 doses at least.  Monitor response.  Paroxysmal A-fib - CHA2DS2-VASc score at least 5. - Continue home  dose Xarelto and dofetilide  Hyperlipidemia - Continue home dose statin  BMI 39 - Outpatient follow up for lifestyle modification and risk factor management  OSA on CPAP - Continue CPAP at bedtime  GERD - Continue pantoprazole daily  Chronic diarrhea - Patient follows with gastroenterology outpatient - Has prior diagnosis of small intestinal bacterial overgrowth.  Was recently prescribed a myriad of antibiotics for treatment but has not yet initiated.  She will finish current dose of antibiotics for this acute infection prior to initiating. - Follow-up with GI outpatient - Symptomatic support for now  Hypoalbuminemia - Protein loss is likely secondary to patient's chronic diarrhea.  Will order protein supplements. - Patient also concerned about hypocalcemia.  Discussed with her that calcium corrects to normal when albumin accounted for. - Repeat CMP in AM.  DVT prophylaxis: home dose AC Code Status: full Family Communication: none at bedside. Communicated directly with patient. Disposition:  Status is: Inpatient. IV abx for now. Possible DC tomorrow if remaining stble.    Consultants:  none  Procedures:  N/a  Antimicrobials:  Anti-infectives (From admission, onward)    Start     Dose/Rate Route Frequency Ordered Stop   11/30/22 1000  vancomycin (VANCOREADY) IVPB 1500 mg/300 mL        1,500 mg 150 mL/hr over 120 Minutes Intravenous Daily 11/29/22 1842     11/29/22 1800  vancomycin (VANCOCIN) IVPB 1000 mg/200 mL premix        1,000 mg 200 mL/hr over 60 Minutes Intravenous  Once 11/29/22 1653 11/29/22 2200   11/29/22 1630  vancomycin (VANCOCIN) IVPB 1000 mg/200 mL premix  Status:  Discontinued        1,000 mg 200 mL/hr over 60 Minutes Intravenous  Once 11/29/22 1616 11/29/22 1621   11/29/22 1630  cefTRIAXone (ROCEPHIN) 2 g in sodium chloride 0.9 % 100 mL IVPB        2 g 200 mL/hr over 30 Minutes Intravenous Daily 11/29/22 1616     11/29/22 1300  vancomycin  (VANCOREADY) IVPB 1500 mg/300 mL  Status:  Discontinued        1,500 mg 150 mL/hr over 120 Minutes Intravenous  Once 11/29/22 1252 11/29/22 1253   11/29/22 1230  vancomycin (VANCOCIN) IVPB 1000 mg/200 mL premix  Status:  Discontinued        1,000 mg 200 mL/hr over 60 Minutes Intravenous  Once 11/29/22 1226 11/29/22 1415       Data Reviewed: I have personally reviewed following labs and imaging studies CBC: Recent Labs  Lab 11/29/22 1235 11/30/22 0547  WBC 7.9 5.7  NEUTROABS 5.9  --   HGB 14.2 12.6  HCT 45.0 39.9  MCV 91.5 91.9  PLT 266 223   Basic Metabolic Panel: Recent Labs  Lab 11/29/22 1235 11/30/22 0547  NA 139 136  K 3.5 3.7  CL 102 106  CO2 26 22  GLUCOSE 104* 103*  BUN 17 17  CREATININE 0.78 0.72  CALCIUM 9.6 8.8*   GFR: Estimated Creatinine Clearance: 81 mL/min (by C-G formula based on SCr of 0.72 mg/dL). Liver Function Tests: Recent Labs  Lab 11/29/22 1235 11/30/22 0547  AST 29 22  ALT 21  18  ALKPHOS 71 53  BILITOT 0.9 0.6  PROT 7.7 6.1*  ALBUMIN 4.3 3.4*   CBG: No results for input(s): "GLUCAP" in the last 168 hours.  Recent Results (from the past 240 hour(s))  Clostridium Difficile by PCR     Status: None   Collection Time: 11/20/22 11:49 AM   Specimen: STOOL   Stool  Result Value Ref Range Status   Toxigenic C. Difficile by PCR Negative Negative Final     Radiology Studies: DG Thoracic Spine 2 View  Result Date: 11/29/2022 CLINICAL DATA:  Pain. EXAM: THORACIC SPINE 2 VIEWS COMPARISON:  None Available. FINDINGS: Unremarkable spinal curvature. No spondylolisthesis. Vertebral body heights are maintained. No aggressive osseous lesion. Mild multilevel degenerative changes in the form of reduced intervertebral disc height, endplate sclerosis/irregularity and marginal osteophyte formation. Visualized soft tissues are within normal limits. IMPRESSION: *No acute osseous abnormality of the thoracic spine. *Mild multilevel degenerative changes.  Electronically Signed   By: Jules Schick M.D.   On: 11/29/2022 16:07   DG Chest 2 View  Result Date: 11/29/2022 CLINICAL DATA:  Pain. EXAM: CHEST - 2 VIEW COMPARISON:  10/26/2021. FINDINGS: Bilateral lung fields are clear. Note is made of elevated right hemidiaphragm. Bilateral costophrenic angles are clear. Normal cardio-mediastinal silhouette. There is a left sided 2-lead pacemaker. No acute osseous abnormalities. The soft tissues are within normal limits. IMPRESSION: *No active cardiopulmonary disease. Electronically Signed   By: Jules Schick M.D.   On: 11/29/2022 16:06    Scheduled Meds:  dofetilide  250 mcg Oral BID   Ferrous Fumarate  1 tablet Oral Daily   fluticasone  1 spray Each Nare QHS   folic acid  1 mg Oral Daily   magnesium oxide  400 mg Oral Daily   pantoprazole  40 mg Oral QAC supper   potassium chloride SA  20 mEq Oral Daily   pravastatin  40 mg Oral QPM   rivaroxaban  20 mg Oral Q supper   spironolactone  25 mg Oral Daily   Continuous Infusions:  cefTRIAXone (ROCEPHIN)  IV 2 g (11/29/22 1842)   vancomycin       LOS: 1 day    Time spent:   Debarah Crape, DO [facility]  To contact the attending physician between 7A-7P please use Epic Chat. To contact the covering physician during after hours 7P-7A, please review Amion.   11/30/2022, 7:49 AM

## 2022-11-30 NOTE — Progress Notes (Signed)
   11/30/22 2048  BiPAP/CPAP/SIPAP  BiPAP/CPAP/SIPAP Pt Type Adult (prefers self placement)  BiPAP/CPAP/SIPAP Resmed (from home)  Mask Type Nasal pillows (from home)  Heater Temperature  (sterile water supplied)  Patient Home Equipment Yes  Auto Titrate Yes (4-16)  Safety Check Completed by RT for Home Unit Yes, no issues noted  BiPAP/CPAP /SiPAP Vitals  Pulse Rate 71  SpO2 100 %  Bilateral Breath Sounds Clear;Diminished  MEWS Score/Color  MEWS Score 0  MEWS Score Color Green

## 2022-11-30 NOTE — Plan of Care (Signed)

## 2022-12-01 ENCOUNTER — Encounter (HOSPITAL_COMMUNITY): Payer: Medicare Other

## 2022-12-01 ENCOUNTER — Inpatient Hospital Stay (HOSPITAL_COMMUNITY): Payer: Medicare Other

## 2022-12-01 DIAGNOSIS — M7989 Other specified soft tissue disorders: Secondary | ICD-10-CM

## 2022-12-01 LAB — CBC WITH DIFFERENTIAL/PLATELET
Abs Immature Granulocytes: 0.02 10*3/uL (ref 0.00–0.07)
Basophils Absolute: 0.1 10*3/uL (ref 0.0–0.1)
Basophils Relative: 1 %
Eosinophils Absolute: 0.2 10*3/uL (ref 0.0–0.5)
Eosinophils Relative: 4 %
HCT: 38.2 % (ref 36.0–46.0)
Hemoglobin: 12.4 g/dL (ref 12.0–15.0)
Immature Granulocytes: 0 %
Lymphocytes Relative: 23 %
Lymphs Abs: 1.4 10*3/uL (ref 0.7–4.0)
MCH: 29.4 pg (ref 26.0–34.0)
MCHC: 32.5 g/dL (ref 30.0–36.0)
MCV: 90.5 fL (ref 80.0–100.0)
Monocytes Absolute: 0.8 10*3/uL (ref 0.1–1.0)
Monocytes Relative: 12 %
Neutro Abs: 3.6 10*3/uL (ref 1.7–7.7)
Neutrophils Relative %: 60 %
Platelets: 222 10*3/uL (ref 150–400)
RBC: 4.22 MIL/uL (ref 3.87–5.11)
RDW: 14.6 % (ref 11.5–15.5)
WBC: 6.1 10*3/uL (ref 4.0–10.5)
nRBC: 0 % (ref 0.0–0.2)

## 2022-12-01 LAB — COMPREHENSIVE METABOLIC PANEL
ALT: 17 U/L (ref 0–44)
AST: 21 U/L (ref 15–41)
Albumin: 3.2 g/dL — ABNORMAL LOW (ref 3.5–5.0)
Alkaline Phosphatase: 50 U/L (ref 38–126)
Anion gap: 8 (ref 5–15)
BUN: 18 mg/dL (ref 8–23)
CO2: 23 mmol/L (ref 22–32)
Calcium: 8.5 mg/dL — ABNORMAL LOW (ref 8.9–10.3)
Chloride: 106 mmol/L (ref 98–111)
Creatinine, Ser: 0.75 mg/dL (ref 0.44–1.00)
GFR, Estimated: >60 mL/min (ref >60)
Glucose, Bld: 106 mg/dL — ABNORMAL HIGH (ref 70–99)
Potassium: 3.4 mmol/L — ABNORMAL LOW (ref 3.5–5.1)
Sodium: 137 mmol/L (ref 135–145)
Total Bilirubin: 0.6 mg/dL (ref <1.2)
Total Protein: 5.7 g/dL — ABNORMAL LOW (ref 6.5–8.1)

## 2022-12-01 LAB — PHOSPHORUS: Phosphorus: 3.5 mg/dL (ref 2.5–4.6)

## 2022-12-01 LAB — MAGNESIUM: Magnesium: 1.9 mg/dL (ref 1.7–2.4)

## 2022-12-01 MED ORDER — CALCIUM CARBONATE ANTACID 500 MG PO CHEW
1.0000 | CHEWABLE_TABLET | Freq: Three times a day (TID) | ORAL | Status: DC
  Administered 2022-12-01 – 2022-12-02 (×3): 200 mg via ORAL
  Filled 2022-12-01 (×3): qty 1

## 2022-12-01 MED ORDER — POTASSIUM CHLORIDE 20 MEQ PO PACK
40.0000 meq | PACK | Freq: Once | ORAL | Status: AC
  Administered 2022-12-01: 40 meq via ORAL
  Filled 2022-12-01: qty 2

## 2022-12-01 MED ORDER — POTASSIUM CHLORIDE CRYS ER 20 MEQ PO TBCR
40.0000 meq | EXTENDED_RELEASE_TABLET | Freq: Every day | ORAL | Status: DC
  Administered 2022-12-02: 40 meq via ORAL
  Filled 2022-12-01: qty 2

## 2022-12-01 MED ORDER — FUROSEMIDE 10 MG/ML IJ SOLN
40.0000 mg | Freq: Two times a day (BID) | INTRAMUSCULAR | Status: DC
  Administered 2022-12-01 – 2022-12-02 (×2): 40 mg via INTRAVENOUS
  Filled 2022-12-01 (×2): qty 4

## 2022-12-01 NOTE — Evaluation (Signed)
Physical Therapy Evaluation Patient Details Name: Veronica Bradford MRN: [id number] DOB: 11/27/1945 Today's Date: 12/01/2022  History of Present Illness  Pt is 77 yo female admitted on 11/29/22 with cellulitis of L LE.  She had recent fall onto knees with ongoing ecchymosis bil LE.  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  Clinical Impression  Pt admitted with above diagnosis.  She has been ambulating in room independently and demonstrated safe mobility to therapist.  She was getting outpt PT recently to address balance and L LE weakness - recommend pt to continue.  Pt did have some c/o pain in mid-thoracic region on paraspinal muscles - states improved with her hot rice pack at home.  Noted increased tightness in paraspinal muscles - advised continuing heat, trigger point release (with rice pack), and ROM/stretching as able. Can also be addressed with outpt PT if continues.   No acute PT indicated.       If plan is discharge home, recommend the following: Help with stairs or ramp for entrance   Can travel by private vehicle        Equipment Recommendations None recommended by PT  Recommendations for Other Services       Functional Status Assessment Patient has not had a recent decline in their functional status     Precautions / Restrictions Precautions Precautions: Fall      Mobility  Bed Mobility Overal bed mobility: Modified Independent                  Transfers Overall transfer level: Modified independent                 General transfer comment: Has been mobilizing in room.  Demonstrated safely    Ambulation/Gait Ambulation/Gait assistance: Supervision Gait Distance (Feet): 300 Feet Assistive device: None Gait Pattern/deviations: WFL(Within Functional Limits) Gait velocity: decreased     General Gait Details:  normal gait pattern  Stairs            Wheelchair Mobility     Tilt Bed    Modified Rankin (Stroke Patients Only)       Balance Overall balance assessment: Independent                                           Pertinent Vitals/Pain Pain Assessment Pain Assessment: 0-10 Pain Score: 4  Pain Location: Mid back (paraspinal muscles, mid thoracic) Pain Descriptors / Indicators: Sore Pain Intervention(s): Limited activity within patient's tolerance, Monitored during session, Premedicated before session    Home Living Family/patient expects to be discharged to:: Private residence Living Arrangements: Spouse/significant other;Children Available Help at Discharge: Family;Available 24 hours/day Type of Home: House Home Access: Ramped entrance;Other (comment) (ramp plus 2 steps with grab bar)       Home Layout: Full bath on main level;Multi-level;Able to live on main level with bedroom/bathroom Home Equipment: Shower seat;Rolling Walker (2 wheels);Cane - single point Additional Comments: Adjustable bed    Prior Function               Mobility Comments: Likes to walk the dog was feeling. Ambulated without AD.  Pt reports recently feeling more unsteady/weak and asked for outpt PT (had been 3 visit).  States recent fall from tripping on cord.  ADLs Comments: independent adls and iadls     Extremity/Trunk Assessment   Upper Extremity Assessment Upper Extremity Assessment: Overall WFL for tasks assessed    Lower Extremity Assessment Lower Extremity Assessment: LLE deficits/detail LLE Deficits / Details: Pt reports L hip never fully recovered from THA in 2023.  States feels weaker than R.  ROM WFL.  MMT 4/5 throughout    Cervical / Trunk Assessment Cervical / Trunk Assessment: Normal  Communication      Cognition Arousal: Alert Behavior During Therapy: WFL for tasks assessed/performed Overall Cognitive Status: Within Functional Limits for tasks  assessed                                          General Comments  Pt recently getting outpt PT for balance/strengthening.  Recommending continuing her HEP and resuming outpt PT.  Pt did have some c/o pain in mid-thoracic region on paraspinal muscles since fall- states improved with her hot rice pack at home.  Noted increased tightness in paraspinal muscles - advised continuing heat, trigger point release (with rice pack), and ROM/stretching as able. Educated on monitoring for peripherlization/radiation of pain and if increased to stop the activity causing the pain. At this time is not radiating.    Exercises     Assessment/Plan    PT Assessment Patient does not need any further PT services  PT Problem List         PT Treatment Interventions      PT Goals (Current goals can be found in the Care Plan section)  Acute Rehab PT Goals Patient Stated Goal: return home PT Goal Formulation: All assessment and education complete, DC therapy    Frequency       Co-evaluation               AM-PAC PT "6 Clicks" Mobility  Outcome Measure Help needed turning from your back to your side while in a flat bed without using bedrails?: None Help needed moving from lying on your back to sitting on the side of a flat bed without using bedrails?: None Help needed moving to and from a bed to a chair (including a wheelchair)?: None Help needed standing up from a chair using your arms (e.g., wheelchair or bedside chair)?: None Help needed to walk in hospital room?: A Little Help needed climbing 3-5 steps with a railing? : A Little 6 Click Score: 22    End of Session Equipment Utilized During Treatment: Gait belt Activity Tolerance: Patient tolerated treatment well Patient left: in bed;with call bell/phone within reach Nurse Communication: Mobility status PT Visit Diagnosis: Muscle weakness (generalized) (M62.81)    Time: 2130-8657 PT Time Calculation (min) (ACUTE ONLY):  37 min   Charges:   PT Evaluation $PT Eval Low Complexity: 1 Low PT Treatments $Therapeutic Activity: 8-22 mins PT General Charges $$ ACUTE PT VISIT: 1 Visit         Anise Salvo, PT Acute Rehab Services [facility] [phone]   Rayetta Humphrey 12/01/2022, 2:40 PM

## 2022-12-01 NOTE — Progress Notes (Signed)
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  Patient reports 2 weeks prior she had ejections for varicose vein treatment in vascular clinic.  She reports shortly after treatment she sustained a fall landing on her knees and subsequently developed bilateral pretibial ecchymosis.  She reports that the erythema and tenderness around this area has become significantly worse so she called her doctor who prescribed doxycycline, however erythema persisted and patient developed malaise.  She then presented to the ED.  On arrival to the ED labs were grossly unremarkable.  Chest x-ray without any active cardiopulmonary process.  She was admitted for treatment of left lower extremity cellulitis (Of note, she has also recently had IM Rocephin followed by oral cephalexin for 5 days for UTI 1 month ago)  Subjective: Ongoing left leg pain and erythema today. Some improvement compared to yesterday, some new erythema on the right leg today.  Patient does endorse perhaps mild increase in urinary output but not significantly so.  Has been wearing her TED hose    Objective: Vitals:   11/30/22 1931 11/30/22 2048 12/01/22 0422 12/01/22 1229  BP: (!) 148/53  (!) 126/45 (!) 153/64  Pulse: 68 71 69 70  Resp: 16  16 18   Temp: (!) 97.4 F (36.3 C)  98.4 F (36.9 C) 97.9 F (36.6 C)  TempSrc: Oral  Oral Oral  SpO2: 99% 100% 98% 99%  Weight:       Height:        Intake/Output Summary (Last 24 hours) at 12/01/2022 1336 Last data filed at 12/01/2022 1300 Gross per 24 hour  Intake 600 ml  Output --  Net 600 ml   Filed Weights   11/29/22 1731  Weight: 115.7 kg    Examination: General exam: Appears calm and comfortable, NAD Respiratory system: No work of breathing, symmetric chest wall expansion Cardiovascular system: S1 & S2 heard, RRR.  Gastrointestinal system: Abdomen is nondistended, soft and nontender.  Neuro: Alert and oriented. No focal neurological deficits. Extremities: wearing TED hose.  2+ pitting edema left leg, 1+ pitting edema right leg, bilateral lower extremity tenderness to palpation, erythema circumferentially around left calf extending distally down towards the ankle, has improved from prior exams with less proximal extension today.  Very mild erythema on the medial surface of right calf.  Some warmth to touch.   Psychiatry: Demonstrates appropriate judgement and insight. Mood & affect appropriate for situation.   Assessment & Plan:  Principal Problem:   Cellulitis of left lower extremity Active Problems:   HTN (hypertension)   Diastolic dysfunction-grade 2   Dyslipidemia   Obesity (BMI 30-39.9)   OSA on CPAP   GERD (gastroesophageal reflux disease)   Paroxysmal atrial fibrillation (HCC)   Chronic diastolic CHF (congestive heart failure) (HCC)   Class 2 obesity  Cellulitis of left lower extremity -  Afebrile.  No leukocytosis - Clinical exam is improving though she does have some mild erythema beginning on the right leg.  Will  avoid further de-escalation of antibiotics today.  Continue with ceftriaxone only for now.  If she continues to have improvement we will plan to transition to p.o. antibiotics tomorrow and discharge. - Given significant tenderness also obtained dopplers which were negative for DVT bilaterally. - Pain management as needed - Follow CBC  Ecchymosis bilateral lower extremities -  Secondary to mechanical fall knees - Healing appropriately.  No evidence of hematoma formation  Hypertension - Continue home meds  Chronic diastolic heart failure, grade 2 diastolic dysfunction - Pitting edema b/l likely making cellulitis pain worse - Proceed with IV Lasix twice daily.  Will need higher dose potassium while receiving IV Lasix.  Have scheduled this for tomorrow morning. - Continue home dose Tikosyn, maintain potassium above 4.  No concerns for interaction with Tikosyn and ceftriaxone.  Paroxysmal A-fib - CHA2DS2-VASc score at least 5. - Continue home dose Xarelto and dofetilide  Hyperlipidemia - Continue home dose statin  BMI 39 - Outpatient follow up for lifestyle modification and risk factor management  OSA on CPAP - Continue CPAP at bedtime  GERD - Continue pantoprazole daily  Chronic diarrhea - Patient follows with gastroenterology outpatient - Has prior diagnosis of small intestinal bacterial overgrowth.  Was recently prescribed a myriad of antibiotics for treatment but has not yet initiated.  She will finish current dose of antibiotics for this acute infection prior to initiating. - Follow-up with GI outpatient - Symptomatic support for now. No diarrhea today.  Hypoalbuminemia - Protein loss is likely secondary to patient's chronic diarrhea.  Cont protein supplements. - GI follow-up as above  Hypocalcemia -- Mildly decreased, corrected to normal when accounting for albumin yesterday.  Slightly decreased today.  Will initiate calcium carbonate today. -- CMP in AM  DVT prophylaxis: home dose AC Code Status: full Family Communication: none at bedside. Communicated directly with patient. Disposition:  Status is: Inpatient. IV abx for now. Possible DC tomorrow if improving.    Consultants:  none  Procedures:  N/a  Antimicrobials:  Anti-infectives (From admission, onward)    Start     Dose/Rate Route Frequency Ordered Stop   11/30/22 1000   vancomycin (VANCOREADY) IVPB 1500 mg/300 mL  Status:  Discontinued        1,500 mg 150 mL/hr over 120 Minutes Intravenous Daily 11/29/22 1842 11/30/22 1434   11/29/22 1800  vancomycin (VANCOCIN) IVPB 1000 mg/200 mL premix        1,000 mg 200 mL/hr over 60 Minutes Intravenous  Once 11/29/22 1653 11/29/22 2200   11/29/22 1630  vancomycin (VANCOCIN) IVPB 1000 mg/200 mL premix  Status:  Discontinued        1,000 mg 200 mL/hr over 60 Minutes Intravenous  Once 11/29/22 1616 11/29/22 1621   11/29/22 1630  cefTRIAXone (ROCEPHIN) 2 g in sodium chloride 0.9 % 100 mL IVPB        2 g 200 mL/hr over 30 Minutes Intravenous Daily 11/29/22 1616     11/29/22 1300  vancomycin (VANCOREADY) IVPB 1500 mg/300 mL  Status:  Discontinued        1,500 mg 150 mL/hr over 120 Minutes Intravenous  Once 11/29/22 1252 11/29/22 1253   11/29/22 1230  vancomycin (VANCOCIN) IVPB 1000 mg/200 mL premix  Status:  Discontinued        1,000 mg 200 mL/hr over 60 Minutes Intravenous  Once 11/29/22 1226 11/29/22 1415       Data Reviewed: I have personally reviewed following labs and imaging studies CBC: Recent Labs  Lab  11/29/22 1235 11/30/22 0547 12/01/22 0537  WBC 7.9 5.7 6.1  NEUTROABS 5.9  --  3.6  HGB 14.2 12.6 12.4  HCT 45.0 39.9 38.2  MCV 91.5 91.9 90.5  PLT 266 223 222   Basic Metabolic Panel: Recent Labs  Lab 11/29/22 1235 11/30/22 0547 12/01/22 0537  NA 139 136 137  K 3.5 3.7 3.4*  CL 102 106 106  CO2 26 22 23   GLUCOSE 104* 103* 106*  BUN 17 17 18   CREATININE 0.78 0.72 0.75  CALCIUM 9.6 8.8* 8.5*  MG  --   --  1.9  PHOS  --   --  3.5   GFR: Estimated Creatinine Clearance: 81 mL/min (by C-G formula based on SCr of 0.75 mg/dL). Liver Function Tests: Recent Labs  Lab 11/29/22 1235 11/30/22 0547 12/01/22 0537  AST 29 22 21   ALT 21 18 17   ALKPHOS 71 53 50  BILITOT 0.9 0.6 0.6  PROT 7.7 6.1* 5.7*  ALBUMIN 4.3 3.4* 3.2*   CBG: No results for input(s): "GLUCAP" in the last 168  hours.  Recent Results (from the past 240 hour(s))  MRSA Next Gen by PCR, Nasal     Status: None   Collection Time: 11/30/22 12:11 PM   Specimen: Nasal Mucosa; Nasal Swab  Result Value Ref Range Status   MRSA by PCR Next Gen NOT DETECTED NOT DETECTED Final    Comment: (NOTE) The GeneXpert MRSA Assay (FDA approved for NASAL specimens only), is one component of a comprehensive MRSA colonization surveillance program. It is not intended to diagnose MRSA infection nor to guide or monitor treatment for MRSA infections. Test performance is not FDA approved in patients less than 23 years old. Performed at [facility], [address], [place], [place] [address]      Radiology Studies: VAS [place] LOWER EXTREMITY VENOUS (DVT)  Result Date: 12/01/2022  Lower Venous DVT Study Patient Name:  MAKAY FAIRCHILD [place]  Date of Exam:   12/01/2022 Medical Rec #: [id number]      Accession #:    [id number] Date of Birth: 16-Aug-1945     Patient Gender: F Patient Age:   22 years Exam Location:  [facility] Procedure:      VAS [place] LOWER EXTREMITY VENOUS (DVT) Referring Phys: Debarah Crape --------------------------------------------------------------------------------  Indications: Pain, Swelling, and Erythema (cellulitis). Other Indications: Patient had varicose vein treatment/injections 2+ weeks ago,                    fall 2 weeks ago. Anticoagulation: Xarelto (for Afib). Limitations: Poor ultrasound/tissue interface. Comparison Study: Previous exam on 04/17/2022 was negative for DVT. Performing Technologist: Ernestene Mention RVT, RDMS  Examination Guidelines: A complete evaluation includes B-mode imaging, spectral Doppler, color Doppler, and power Doppler as needed of all accessible portions of each vessel. Bilateral testing is considered an integral part of a complete examination. Limited examinations for reoccurring indications may be performed as noted. The reflux portion of the exam is performed with  the patient in reverse Trendelenburg.  +---------+---------------+---------+-----------+----------+-------------------+ RIGHT    CompressibilityPhasicitySpontaneityPropertiesThrombus Aging      +---------+---------------+---------+-----------+----------+-------------------+ CFV      Full           Yes      Yes                                      +---------+---------------+---------+-----------+----------+-------------------+ SFJ  Full                                                             +---------+---------------+---------+-----------+----------+-------------------+ FV Prox  Full           Yes      Yes                                      +---------+---------------+---------+-----------+----------+-------------------+ FV Mid   Full           Yes      Yes                                      +---------+---------------+---------+-----------+----------+-------------------+ FV DistalFull           Yes      Yes                                      +---------+---------------+---------+-----------+----------+-------------------+ PFV      Full                                                             +---------+---------------+---------+-----------+----------+-------------------+ POP      Full           Yes      Yes                                      +---------+---------------+---------+-----------+----------+-------------------+ PTV      Full                                         Not well visualized +---------+---------------+---------+-----------+----------+-------------------+ PERO     Full                                         Not well visualized +---------+---------------+---------+-----------+----------+-------------------+   +---------+---------------+---------+-----------+----------+--------------+ LEFT     CompressibilityPhasicitySpontaneityPropertiesThrombus Aging  +---------+---------------+---------+-----------+----------+--------------+ CFV      Full           Yes      Yes                                 +---------+---------------+---------+-----------+----------+--------------+ SFJ      Full                                                        +---------+---------------+---------+-----------+----------+--------------+  FV Prox  Full           Yes      Yes                                 +---------+---------------+---------+-----------+----------+--------------+ FV Mid   Full           Yes      Yes                                 +---------+---------------+---------+-----------+----------+--------------+ FV DistalFull           Yes      Yes                                 +---------+---------------+---------+-----------+----------+--------------+ PFV      Full                                                        +---------+---------------+---------+-----------+----------+--------------+ POP      Full           Yes      Yes                                 +---------+---------------+---------+-----------+----------+--------------+ PTV      Full                                                        +---------+---------------+---------+-----------+----------+--------------+ PERO     Full                                                        +---------+---------------+---------+-----------+----------+--------------+     Summary: BILATERAL: - No evidence of deep vein thrombosis seen in the lower extremities, bilaterally. -No evidence of popliteal cyst, bilaterally.  LEFT: Subcutaneous edema in area of calf and ankle.  *See table(s) above for measurements and observations. Electronically signed by Sherald Hess MD on 12/01/2022 at 12:59:57 PM.    Final    DG Thoracic Spine 2 View  Result Date: 11/29/2022 CLINICAL DATA:  Pain. EXAM: THORACIC SPINE 2 VIEWS COMPARISON:  None Available. FINDINGS: Unremarkable  spinal curvature. No spondylolisthesis. Vertebral body heights are maintained. No aggressive osseous lesion. Mild multilevel degenerative changes in the form of reduced intervertebral disc height, endplate sclerosis/irregularity and marginal osteophyte formation. Visualized soft tissues are within normal limits. IMPRESSION: *No acute osseous abnormality of the thoracic spine. *Mild multilevel degenerative changes. Electronically Signed   By: Jules Schick M.D.   On: 11/29/2022 16:07   DG Chest 2 View  Result Date: 11/29/2022 CLINICAL DATA:  Pain. EXAM: CHEST - 2 VIEW COMPARISON:  10/26/2021. FINDINGS: Bilateral lung fields are clear. Note is made of elevated right hemidiaphragm. Bilateral costophrenic angles are clear. Normal cardio-mediastinal silhouette.  There is a left sided 2-lead pacemaker. No acute osseous abnormalities. The soft tissues are within normal limits. IMPRESSION: *No active cardiopulmonary disease. Electronically Signed   By: Jules Schick M.D.   On: 11/29/2022 16:06    Scheduled Meds:  dofetilide  250 mcg Oral BID   Ferrous Fumarate  1 tablet Oral Daily   fluticasone  1 spray Each Nare QHS   folic acid  1 mg Oral Daily   furosemide  40 mg Intravenous BID   magnesium oxide  400 mg Oral Daily   NONFORMULARY OR COMPOUNDED ITEM 10 mg  10 mg Oral QHS   pantoprazole  40 mg Oral QAC supper   potassium chloride SA  20 mEq Oral Daily   pravastatin  40 mg Oral QPM   Ensure Max Protein  11 oz Oral Daily   rivaroxaban  20 mg Oral Q supper   spironolactone  25 mg Oral Daily   Continuous Infusions:  cefTRIAXone (ROCEPHIN)  IV Stopped (12/01/22 0923)     LOS: 2 days    Time spent:   Debarah Crape, DO [facility]  To contact the attending physician between 7A-7P please use Epic Chat. To contact the covering physician during after hours 7P-7A, please review Amion.   12/01/2022, 1:36 PM

## 2022-12-01 NOTE — Progress Notes (Signed)
PT Cancellation Note  Patient Details Name: Veronica Bradford MRN: [id number] DOB: [birth year]   Cancelled Treatment:    Reason Eval/Treat Not Completed: Medical issues which prohibited therapy;Patient at procedure or test/unavailable  Pt having R LE doppler to r/o DVT.  Will await results.  Anise Salvo, PT Acute Rehab [facility] [phone]  Rayetta Humphrey 12/01/2022, 10:29 AM

## 2022-12-01 NOTE — Progress Notes (Signed)
   12/01/22 1335  TOC Brief Assessment  Insurance and Status Reviewed  Patient has primary care physician Yes  Home environment has been reviewed Single family home w/ spouse  Prior level of function: Independent/modified independent  Prior/Current Home Services No current home services  Social Determinants of Health Reivew SDOH reviewed no interventions necessary  Readmission risk has been reviewed Yes  Transition of care needs transition of care needs identified, TOC will continue to follow   PT consulted to evaluate pt. Will follow for recommendations.

## 2022-12-01 NOTE — Progress Notes (Signed)
BLE venous duplex has been completed.   Results can be found under chart review under CV PROC. 12/01/2022 12:36 PM Harrison Zetina RVT, RDMS

## 2022-12-01 NOTE — Progress Notes (Signed)
   12/01/22 1930  BiPAP/CPAP/SIPAP  BiPAP/CPAP/SIPAP Pt Type Adult (prefers self placement)  BiPAP/CPAP/SIPAP Resmed  Mask Type Nasal pillows  FiO2 (%) 21 %  Heater Temperature  (sterile water supplied)  Patient Home Equipment Yes  Auto Titrate Yes (4-16)  Safety Check Completed by RT for Home Unit Yes, no issues noted  BiPAP/CPAP /SiPAP Vitals  Pulse Rate 73  Resp 16  SpO2 98 %  Bilateral Breath Sounds Clear;Diminished  MEWS Score/Color  MEWS Score 0  MEWS Score Color Green

## 2022-12-02 LAB — COMPREHENSIVE METABOLIC PANEL
ALT: 16 U/L (ref 0–44)
AST: 20 U/L (ref 15–41)
Albumin: 3.4 g/dL — ABNORMAL LOW (ref 3.5–5.0)
Alkaline Phosphatase: 53 U/L (ref 38–126)
Anion gap: 9 (ref 5–15)
BUN: 23 mg/dL (ref 8–23)
CO2: 26 mmol/L (ref 22–32)
Calcium: 8.7 mg/dL — ABNORMAL LOW (ref 8.9–10.3)
Chloride: 105 mmol/L (ref 98–111)
Creatinine, Ser: 0.76 mg/dL (ref 0.44–1.00)
GFR, Estimated: >60 mL/min (ref >60)
Glucose, Bld: 107 mg/dL — ABNORMAL HIGH (ref 70–99)
Potassium: 4.1 mmol/L (ref 3.5–5.1)
Sodium: 140 mmol/L (ref 135–145)
Total Bilirubin: 0.4 mg/dL (ref <1.2)
Total Protein: 5.9 g/dL — ABNORMAL LOW (ref 6.5–8.1)

## 2022-12-02 LAB — CBC WITH DIFFERENTIAL/PLATELET
Abs Immature Granulocytes: 0.02 10*3/uL (ref 0.00–0.07)
Basophils Absolute: 0.1 10*3/uL (ref 0.0–0.1)
Basophils Relative: 1 %
Eosinophils Absolute: 0.2 10*3/uL (ref 0.0–0.5)
Eosinophils Relative: 3 %
HCT: 40.1 % (ref 36.0–46.0)
Hemoglobin: 12.5 g/dL (ref 12.0–15.0)
Immature Granulocytes: 0 %
Lymphocytes Relative: 22 %
Lymphs Abs: 1.2 10*3/uL (ref 0.7–4.0)
MCH: 28.8 pg (ref 26.0–34.0)
MCHC: 31.2 g/dL (ref 30.0–36.0)
MCV: 92.4 fL (ref 80.0–100.0)
Monocytes Absolute: 0.8 10*3/uL (ref 0.1–1.0)
Monocytes Relative: 13 %
Neutro Abs: 3.4 10*3/uL (ref 1.7–7.7)
Neutrophils Relative %: 61 %
Platelets: 224 10*3/uL (ref 150–400)
RBC: 4.34 MIL/uL (ref 3.87–5.11)
RDW: 14.6 % (ref 11.5–15.5)
WBC: 5.7 10*3/uL (ref 4.0–10.5)
nRBC: 0 % (ref 0.0–0.2)

## 2022-12-02 LAB — MAGNESIUM: Magnesium: 2.2 mg/dL (ref 1.7–2.4)

## 2022-12-02 LAB — PHOSPHORUS: Phosphorus: 3.1 mg/dL (ref 2.5–4.6)

## 2022-12-02 MED ORDER — CALCIUM CARBONATE ANTACID 500 MG PO CHEW: 1.0000 | CHEWABLE_TABLET | Freq: Three times a day (TID) | ORAL | 0 refills | Status: AC

## 2022-12-02 MED ORDER — LIDOCAINE 5 % EX PTCH: 1.0000 | MEDICATED_PATCH | CUTANEOUS | 0 refills | Status: DC

## 2022-12-02 MED ORDER — CEPHALEXIN 500 MG PO CAPS: 500.0000 mg | ORAL_CAPSULE | Freq: Four times a day (QID) | ORAL | 0 refills | Status: DC

## 2022-12-02 MED ORDER — LIDOCAINE 5 % EX PTCH: 1.0000 | MEDICATED_PATCH | CUTANEOUS | Status: DC

## 2022-12-02 NOTE — Discharge Summary (Signed)
Physician Discharge Summary   Patient: Veronica Bradford MRN: [id number] DOB: 1945-09-27  Admit date:     11/29/2022  Discharge date: 12/02/22  Discharge Physician: Debarah Crape   PCP: Wanda Plump, MD   Recommendations at discharge:   Follow-up with your PCP this week to repeat your potassium level and verify that your infection is improving.  Discharge Diagnoses: Principal Problem:   Cellulitis of left lower extremity Active Problems:   HTN (hypertension)   Diastolic dysfunction-grade 2   Dyslipidemia   Obesity (BMI 30-39.9)   OSA on CPAP   GERD (gastroesophageal reflux disease)   Paroxysmal atrial fibrillation (HCC)   Chronic diastolic CHF (congestive heart failure) (HCC)   Class 2 obesity  Resolved Problems:   * No resolved hospital problems. 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  2 weeks prior to arrival patient had injections in vascular clinic for varicose vein treatment.  She reports shortly thereafter she sustained a fall on her knees and developed bilateral pretibial/prepatellar ecchymosis.  Has been gradually resolving however the erythema and tenderness in her lower extremities has been gradually worsening.  She called her outpatient doctor who prescribed doxycycline.  She completed 2 doses of doxycycline but noted she was developing malaise so she presented to the ED.  On arrival to the ED all labs were grossly unremarkable.  Chest x-ray was without any active cardiopulmonary process.  She was admitted and started on ceftriaxone and vancomycin for cellulitis  treatment.  MRSA PCR was negative and thus antibiotics were de-escalated to ceftriaxone alone.  Resolution was gradual and patient did require IV diuresis to help aid in her lower extremity edema.  By evaluation on 11/30 patient still had some erythema but had significantly improved from arrival.  She maintained no leukocytosis, and no fever throughout her admission.  She will be discharging home today with an additional 7 days of cefazolin and instructions to follow-up with her PCP early next week, to ensure infection resolution and repeat potassium check.  Given her lower extremity edema we have temporarily increased her Lasix dosing and subsequently increased her potassium replacement.  These instructions were reviewed with her prior to discharge, and can be viewed in the AVS.  Given ER return precautions.    Consultants: None Procedures performed: None Disposition: Home Diet recommendation:  Discharge Diet Orders (From admission, onward)     Start     Ordered   12/02/22 0000  Diet - low sodium heart healthy        12/02/22 1448           Cardiac and Carb modified diet DISCHARGE MEDICATION: Allergies as of 12/02/2022       Reactions   Ibuprofen Other (See Comments)   Pt is on Xarelto   Lactose Intolerance (gi) Diarrhea, Nausea And Vomiting   Lisinopril Cough   Nsaids Other (See Comments)   Pt on Xarelto   Tape    Causes blisters   Tolmetin Other (See Comments)   Pt on Xarelto   Zoster Vaccine Live Other (See Comments)   Serum sickness, high fever, neck pain    Amoxicillin Rash   Penicillins Rash   Local reaction  Medication List     STOP taking these medications    doxycycline 100 MG tablet Commonly known as: VIBRA-TABS   neomycin 500 MG tablet Commonly known as: MYCIFRADIN   Xifaxan 550 MG Tabs tablet Generic drug: rifaximin       TAKE these medications    acetaminophen 500 MG tablet Commonly known as: TYLENOL Take 500 mg by mouth every 6  (six) hours as needed for moderate pain or headache.   acetaminophen-codeine 300-30 MG tablet Commonly known as: TYLENOL #3 TAKE 1 TABLET BY MOUTH AT BEDTIME AS NEEDED FOR MODERATE PAIN   ARTIFICIAL TEAR SOLUTION OP Place 1 drop into both eyes 4 (four) times daily as needed (dry/irritated eyes.).   calcium carbonate 500 MG chewable tablet Commonly known as: TUMS - dosed in mg elemental calcium Chew 1 tablet (200 mg of elemental calcium total) by mouth 3 (three) times daily for 14 days.   cephALEXin 500 MG capsule Commonly known as: KEFLEX Take 1 capsule (500 mg total) by mouth 4 (four) times daily for 7 days.   Chromium Picolinate 500 MCG Tabs Take 500 mcg by mouth daily after supper.   Co Q 10 100 MG Caps Take 100 mg by mouth daily.   CRANBERRY EXTRACT PO Take 2 tablets by mouth at bedtime.   diltiazem 30 MG tablet Commonly known as: Cardizem Take 1 tablet every 4 hours AS NEEDED for heart rate >100 as long as blood pressure >100. What changed:  how much to take how to take this when to take this additional instructions   dofetilide 250 MCG capsule Commonly known as: TIKOSYN Take 1 capsule (250 mcg total) by mouth 2 (two) times daily.   estradiol 0.1 MG/GM vaginal cream Commonly known as: ESTRACE Place 1 Applicatorful vaginally 3 (three) times a week.   ferrous fumarate 325 (106 Fe) MG Tabs tablet Commonly known as: HEMOCYTE - 106 mg FE Take 1 tablet (106 mg of iron total) by mouth 2 (two) times daily. Take apart from pantoprazole   fluticasone 50 MCG/ACT nasal spray Commonly known as: FLONASE Place 1 spray into both nostrils daily.   folic acid 800 MCG tablet Commonly known as: FOLVITE Take 800 mcg by mouth in the morning.   furosemide 40 MG tablet Commonly known as: LASIX Take 1 tablet (40 mg total) by mouth as needed.   lidocaine 5 % Commonly known as: LIDODERM Place 1 patch onto the skin daily. Remove & Discard patch within 12 hours or as directed by  MD   MAGNESIUM GLYCINATE PO Take 240 mg by mouth daily.   olopatadine 0.1 % ophthalmic solution Commonly known as: PATANOL Place 1 drop into both eyes daily as needed (allergies).   pantoprazole 40 MG tablet Commonly known as: PROTONIX Take 1 tablet (40 mg total) by mouth 2 (two) times daily. What changed: when to take this   potassium chloride SA 20 MEQ tablet Commonly known as: KLOR-CON M Take 1 tablet (20 mEq total) by mouth daily.   pravastatin 40 MG tablet Commonly known as: PRAVACHOL Take 1 tablet (40 mg total) by mouth every evening.   rivaroxaban 20 MG Tabs tablet Commonly known as: Xarelto Take 1 tablet (20 mg total) by mouth daily with supper.   spironolactone 25 MG tablet Commonly known as: ALDACTONE TAKE 1 TABLET(25 MG) BY MOUTH DAILY What changed: See the new instructions.   sucralfate 1 g tablet Commonly known as: CARAFATE Take 1 g by mouth 2 (two) times daily.  valsartan 40 MG tablet Commonly known as: DIOVAN Take 1 tablet (40 mg total) by mouth daily. What changed:  how much to take when to take this        Discharge Exam: Filed Weights   11/29/22 1731  Weight: 115.7 kg   Constitutional:  Normal appearance. Non toxic-appearing.  HENT: Head Normocephalic and atraumatic.  Mucous membranes are moist.  Eyes:  Extraocular intact. Conjunctivae normal. Pupils are equal, round, and reactive to light.  Cardiovascular: Rate and Rhythm: Normal rate and regular rhythm.  Pulmonary: Non labored, symmetric rise of chest wall.  Musculoskeletal:  Normal range of motion.  Skin: Left lower extremity with erythema, mildly warm to the touch, erythema has improved significantly from prior exams, no longer reaching the ankle.  Now located centrally mid calf.  Some mild erythema on the right side, noncircumferential. Neurological: No focal deficit present. alert. Oriented. Psychiatric: Mood and Affect congruent.        Condition at discharge: good  The  results of significant diagnostics from this hospitalization (including imaging, microbiology, ancillary and laboratory) are listed below for reference.   Imaging Studies: VAS [place] LOWER EXTREMITY VENOUS (DVT)  Result Date: 12/01/2022  Lower Venous DVT Study Patient Name:  Veronica Bradford [facility]  Date of Exam:   12/01/2022 Medical Rec #: [id number]      Accession #:    [id number] Date of Birth: 12/25/1945     Patient Gender: F Patient Age:   27 years Exam Location:  [facility] Procedure:      VAS [place] LOWER EXTREMITY VENOUS (DVT) Referring Phys: Debarah Crape --------------------------------------------------------------------------------  Indications: Pain, Swelling, and Erythema (cellulitis). Other Indications: Patient had varicose vein treatment/injections 2+ weeks ago,                    fall 2 weeks ago. Anticoagulation: Xarelto (for Afib). Limitations: Poor ultrasound/tissue interface. Comparison Study: Previous exam on 04/17/2022 was negative for DVT. Performing Technologist: Ernestene Mention RVT, RDMS  Examination Guidelines: A complete evaluation includes B-mode imaging, spectral Doppler, color Doppler, and power Doppler as needed of all accessible portions of each vessel. Bilateral testing is considered an integral part of a complete examination. Limited examinations for reoccurring indications may be performed as noted. The reflux portion of the exam is performed with the patient in reverse Trendelenburg.  +---------+---------------+---------+-----------+----------+-------------------+ RIGHT    CompressibilityPhasicitySpontaneityPropertiesThrombus Aging      +---------+---------------+---------+-----------+----------+-------------------+ CFV      Full           Yes      Yes                                      +---------+---------------+---------+-----------+----------+-------------------+ SFJ      Full                                                              +---------+---------------+---------+-----------+----------+-------------------+ FV Prox  Full           Yes      Yes                                      +---------+---------------+---------+-----------+----------+-------------------+  FV Mid   Full           Yes      Yes                                      +---------+---------------+---------+-----------+----------+-------------------+ FV DistalFull           Yes      Yes                                      +---------+---------------+---------+-----------+----------+-------------------+ PFV      Full                                                             +---------+---------------+---------+-----------+----------+-------------------+ POP      Full           Yes      Yes                                      +---------+---------------+---------+-----------+----------+-------------------+ PTV      Full                                         Not well visualized +---------+---------------+---------+-----------+----------+-------------------+ PERO     Full                                         Not well visualized +---------+---------------+---------+-----------+----------+-------------------+   +---------+---------------+---------+-----------+----------+--------------+ LEFT     CompressibilityPhasicitySpontaneityPropertiesThrombus Aging +---------+---------------+---------+-----------+----------+--------------+ CFV      Full           Yes      Yes                                 +---------+---------------+---------+-----------+----------+--------------+ SFJ      Full                                                        +---------+---------------+---------+-----------+----------+--------------+ FV Prox  Full           Yes      Yes                                 +---------+---------------+---------+-----------+----------+--------------+ FV Mid   Full           Yes      Yes                                  +---------+---------------+---------+-----------+----------+--------------+ FV DistalFull  Yes      Yes                                 +---------+---------------+---------+-----------+----------+--------------+ PFV      Full                                                        +---------+---------------+---------+-----------+----------+--------------+ POP      Full           Yes      Yes                                 +---------+---------------+---------+-----------+----------+--------------+ PTV      Full                                                        +---------+---------------+---------+-----------+----------+--------------+ PERO     Full                                                        +---------+---------------+---------+-----------+----------+--------------+     Summary: BILATERAL: - No evidence of deep vein thrombosis seen in the lower extremities, bilaterally. -No evidence of popliteal cyst, bilaterally.  LEFT: Subcutaneous edema in area of calf and ankle.  *See table(s) above for measurements and observations. Electronically signed by Sherald Hess MD on 12/01/2022 at 12:59:57 PM.    Final    DG Thoracic Spine 2 View  Result Date: 11/29/2022 CLINICAL DATA:  Pain. EXAM: THORACIC SPINE 2 VIEWS COMPARISON:  None Available. FINDINGS: Unremarkable spinal curvature. No spondylolisthesis. Vertebral body heights are maintained. No aggressive osseous lesion. Mild multilevel degenerative changes in the form of reduced intervertebral disc height, endplate sclerosis/irregularity and marginal osteophyte formation. Visualized soft tissues are within normal limits. IMPRESSION: *No acute osseous abnormality of the thoracic spine. *Mild multilevel degenerative changes. Electronically Signed   By: Jules Schick M.D.   On: 11/29/2022 16:07   DG Chest 2 View  Result Date: 11/29/2022 CLINICAL DATA:  Pain. EXAM: CHEST - 2 VIEW  COMPARISON:  10/26/2021. FINDINGS: Bilateral lung fields are clear. Note is made of elevated right hemidiaphragm. Bilateral costophrenic angles are clear. Normal cardio-mediastinal silhouette. There is a left sided 2-lead pacemaker. No acute osseous abnormalities. The soft tissues are within normal limits. IMPRESSION: *No active cardiopulmonary disease. Electronically Signed   By: Jules Schick M.D.   On: 11/29/2022 16:06   DG Knee Complete 4 Views Right  Result Date: 11/22/2022 CLINICAL DATA:  Recent trip and fall with right knee pain, initial encounter EXAM: RIGHT KNEE - COMPLETE 4+ VIEW COMPARISON:  None Available. FINDINGS: Right knee prosthesis is seen in satisfactory position. No acute fracture or dislocation is noted. No soft tissue abnormality is noted. A joint effusion is seen. IMPRESSION: Right knee replacement without acute bony abnormality. Electronically Signed   By: Alcide Clever M.D.   On: 11/22/2022  23:13   DG Knee Complete 4 Views Left  Result Date: 11/22/2022 CLINICAL DATA:  Recent trip and fall with knee pain, initial encounter EXAM: LEFT KNEE - COMPLETE 4+ VIEW COMPARISON:  None Available. FINDINGS: Tricompartmental degenerative changes are noted. No joint effusion is seen. No acute fracture or dislocation is noted. Mild anterior soft tissue swelling is seen consistent with the recent injury. IMPRESSION: Degenerative change and soft tissue swelling. No acute bony abnormality is noted. Electronically Signed   By: Alcide Clever M.D.   On: 11/22/2022 23:11   CT Head Wo Contrast  Result Date: 11/22/2022 CLINICAL DATA:  Recent trip and fall with headaches and neck pain, initial encounter EXAM: CT HEAD WITHOUT CONTRAST CT CERVICAL SPINE WITHOUT CONTRAST TECHNIQUE: Multidetector CT imaging of the head and cervical spine was performed following the standard protocol without intravenous contrast. Multiplanar CT image reconstructions of the cervical spine were also generated. RADIATION DOSE  REDUCTION: This exam was performed according to the departmental dose-optimization program which includes automated exposure control, adjustment of the mA and/or kV according to patient size and/or use of iterative reconstruction technique. COMPARISON:  None Available. FINDINGS: CT HEAD FINDINGS Brain: No evidence of acute infarction, hemorrhage, hydrocephalus, extra-axial collection or mass lesion/mass effect. Vascular: No hyperdense vessel or unexpected calcification. Skull: Normal. Negative for fracture or focal lesion. Sinuses/Orbits: No acute finding. Other: None. CT CERVICAL SPINE FINDINGS Alignment: Within normal limits. Skull base and vertebrae: 7 cervical segments are well visualized. Vertebral body height is well maintained. Mild osteophytic changes and facet hypertrophic changes are seen. No acute fracture or acute facet abnormality is noted. The odontoid is within normal limits. Soft tissues and spinal canal: Surrounding soft tissue structures are within normal limits. Upper chest: Visualized lung apices are unremarkable. Other: None IMPRESSION: CT of the head: No acute intracranial abnormality noted. CT of cervical spine: Mild degenerative change without acute abnormality. Electronically Signed   By: Alcide Clever M.D.   On: 11/22/2022 23:10   CT Cervical Spine Wo Contrast  Result Date: 11/22/2022 CLINICAL DATA:  Recent trip and fall with headaches and neck pain, initial encounter EXAM: CT HEAD WITHOUT CONTRAST CT CERVICAL SPINE WITHOUT CONTRAST TECHNIQUE: Multidetector CT imaging of the head and cervical spine was performed following the standard protocol without intravenous contrast. Multiplanar CT image reconstructions of the cervical spine were also generated. RADIATION DOSE REDUCTION: This exam was performed according to the departmental dose-optimization program which includes automated exposure control, adjustment of the mA and/or kV according to patient size and/or use of iterative  reconstruction technique. COMPARISON:  None Available. FINDINGS: CT HEAD FINDINGS Brain: No evidence of acute infarction, hemorrhage, hydrocephalus, extra-axial collection or mass lesion/mass effect. Vascular: No hyperdense vessel or unexpected calcification. Skull: Normal. Negative for fracture or focal lesion. Sinuses/Orbits: No acute finding. Other: None. CT CERVICAL SPINE FINDINGS Alignment: Within normal limits. Skull base and vertebrae: 7 cervical segments are well visualized. Vertebral body height is well maintained. Mild osteophytic changes and facet hypertrophic changes are seen. No acute fracture or acute facet abnormality is noted. The odontoid is within normal limits. Soft tissues and spinal canal: Surrounding soft tissue structures are within normal limits. Upper chest: Visualized lung apices are unremarkable. Other: None IMPRESSION: CT of the head: No acute intracranial abnormality noted. CT of cervical spine: Mild degenerative change without acute abnormality. Electronically Signed   By: Alcide Clever M.D.   On: 11/22/2022 23:10    Microbiology: Results for orders placed or performed during the hospital encounter  of 11/29/22  MRSA Next Gen by PCR, Nasal     Status: None   Collection Time: 11/30/22 12:11 PM   Specimen: Nasal Mucosa; Nasal Swab  Result Value Ref Range Status   MRSA by PCR Next Gen NOT DETECTED NOT DETECTED Final    Comment: (NOTE) The GeneXpert MRSA Assay (FDA approved for NASAL specimens only), is one component of a comprehensive MRSA colonization surveillance program. It is not intended to diagnose MRSA infection nor to guide or monitor treatment for MRSA infections. Test performance is not FDA approved in patients less than 29 years old. Performed at [facility], [address] [place], [place] [address]     Labs: CBC: Recent Labs  Lab 11/29/22 1235 11/30/22 0547 12/01/22 0537 12/02/22 0554  WBC 7.9 5.7 6.1 5.7  NEUTROABS 5.9  --  3.6  3.4  HGB 14.2 12.6 12.4 12.5  HCT 45.0 39.9 38.2 40.1  MCV 91.5 91.9 90.5 92.4  PLT 266 223 222 224   Basic Metabolic Panel: Recent Labs  Lab 11/29/22 1235 11/30/22 0547 12/01/22 0537 12/02/22 0554  NA 139 136 137 140  K 3.5 3.7 3.4* 4.1  CL 102 106 106 105  CO2 26 22 23 26   GLUCOSE 104* 103* 106* 107*  BUN 17 17 18 23   CREATININE 0.78 0.72 0.75 0.76  CALCIUM 9.6 8.8* 8.5* 8.7*  MG  --   --  1.9 2.2  PHOS  --   --  3.5 3.1   Liver Function Tests: Recent Labs  Lab 11/29/22 1235 11/30/22 0547 12/01/22 0537 12/02/22 0554  AST 29 22 21 20   ALT 21 18 17 16   ALKPHOS 71 53 50 53  BILITOT 0.9 0.6 0.6 0.4  PROT 7.7 6.1* 5.7* 5.9*  ALBUMIN 4.3 3.4* 3.2* 3.4*   CBG: No results for input(s): "GLUCAP" in the last 168 hours.  Discharge time spent: greater than 30 minutes.  Signed: Debarah Crape, DO [facility] 12/02/2022

## 2022-12-04 ENCOUNTER — Telehealth: Payer: Self-pay | Admitting: *Deleted

## 2022-12-04 NOTE — Transitions of Care (Post Inpatient/ED Visit) (Signed)
12/04/2022  Name: Veronica Bradford MRN: [id number] DOB: Aug 25, 1945  Today's TOC FU Call Status: Today's TOC FU Call Status:: Successful TOC FU Call Completed TOC FU Call Complete Date: 12/04/22 Patient's Name and Date of Birth confirmed.  Transition Care Management Follow-up Telephone Call Date of Discharge: 12/02/22 Discharge Facility: [facility]) Type of Discharge: Inpatient Admission Primary Inpatient Discharge Diagnosis:: cellulitus of lt lower extremity How have you been since you were released from the hospital?: Better Any questions or concerns?: No  Items Reviewed: Did you receive and understand the discharge instructions provided?: Yes Medications obtained,verified, and reconciled?: Yes (Medications Reviewed) (Patient asked what was the calcium dosage. RN reiterated the order) Any new allergies since your discharge?: No Dietary orders reviewed?: Yes Type of Diet Ordered:: low sodium heart healthy Do you have support at home?: Yes People in Home: spouse Name of Support/Comfort Primary Source: Onalee Hua  Medications Reviewed Today: Medications Reviewed Today     Reviewed by Luella Cook, RN (Case Manager) on 12/04/22 at 1336  Med List Status: <None>   Medication Order Taking? Sig Documenting Provider Last Dose Status Informant  acetaminophen (TYLENOL) 500 MG tablet [id number] Yes Take 500 mg by mouth every 6 (six) hours as needed for moderate pain or headache. [provider] Taking Active Self, Pharmacy Records  acetaminophen-codeine (TYLENOL #3) 300-30 MG tablet [id number] Yes TAKE 1 TABLET BY MOUTH AT BEDTIME AS NEEDED FOR MODERATE PAIN Wanda Plump, MD Taking Active Self, Pharmacy Records  ARTIFICIAL TEAR SOLUTION OP [id number] Yes Place 1 drop into both eyes 4 (four) times daily as needed (dry/irritated eyes.). [provider] Taking Active Self, Pharmacy Records  calcium carbonate (TUMS - DOSED IN MG ELEMENTAL CALCIUM) 500 MG chewable tablet  [id number] Yes Chew 1 tablet (200 mg of elemental calcium total) by mouth 3 (three) times daily for 14 days. Debarah Crape, DO Taking Active   cephALEXin (KEFLEX) 500 MG capsule [id number] Yes Take 1 capsule (500 mg total) by mouth 4 (four) times daily for 7 days. Debarah Crape, DO Taking Active   Chromium Picolinate 500 MCG TABS [id number] Yes Take 500 mcg by mouth daily after supper. [provider] Taking Active Self, Pharmacy Records  Coenzyme Q10 (CO Q 10) 100 MG CAPS [id number] Yes Take 100 mg by mouth daily. [provider] Taking Active Self, Pharmacy Records  CRANBERRY EXTRACT PO [id number] Yes Take 2 tablets by mouth at bedtime. [provider] Taking Active Self, Pharmacy Records  diltiazem (CARDIZEM) 30 MG tablet [id number] Yes Take 1 tablet every 4 hours AS NEEDED for heart rate >100 as long as blood pressure >100.  Patient taking differently: Take 30 mg by mouth every 4 (four) hours. Take 1 tablet every 4 hours AS NEEDED for heart rate >100 as long as blood pressure >100. Take every 4 hours until next daily dose.   Newman Nip, NP Taking Active Self, Pharmacy Records           Med Note Richardean Canal Aug 31, 2022 11:00 AM)    dofetilide [facility]) 250 MCG capsule [id number] Yes Take 1 capsule (250 mcg total) by mouth 2 (two) times daily. Danice Goltz, PA Taking Active Self, Pharmacy Records           Med Note Richardean Canal Aug 31, 2022 11:01 AM) 10am/10pm  estradiol (ESTRACE) 0.1 MG/GM vaginal cream [id number] Yes Place 1 Applicatorful vaginally 3 (three) times a week. Zola Button, Myrene Buddy  R, DO Taking Active Self, Pharmacy Records  ferrous fumarate (HEMOCYTE - 106 MG FE) 325 (106 Fe) MG TABS tablet [id number] Yes Take 1 tablet (106 mg of iron total) by mouth 2 (two) times daily. Take apart from pantoprazole Wanda Plump, MD Taking Active Self, Pharmacy Records  fluticasone [facility]) 50 MCG/ACT nasal spray [id number] Yes Place 1 spray into  both nostrils daily. [provider] Taking Active Self, Pharmacy Records  folic acid (FOLVITE) 800 MCG tablet [id number] Yes Take 800 mcg by mouth in the morning. [provider] Taking Active Self, Pharmacy Records  furosemide (LASIX) 40 MG tablet [id number] Yes Take 1 tablet (40 mg total) by mouth as needed. Duke Salvia, MD Taking Active Self, Pharmacy Records  lidocaine (LIDODERM) 5 % [id number] Yes Place 1 patch onto the skin daily. Remove & Discard patch within 12 hours or as directed by MD Debarah Crape, DO Taking Active   MAGNESIUM GLYCINATE PO [id number] Yes Take 240 mg by mouth daily. [provider] Taking Active Self, Pharmacy Records  olopatadine (PATANOL) 0.1 % ophthalmic solution [id number] Yes Place 1 drop into both eyes daily as needed (allergies). [provider] Taking Active Self, Pharmacy Records  pantoprazole (PROTONIX) 40 MG tablet [id number] Yes Take 1 tablet (40 mg total) by mouth 2 (two) times daily.  Patient taking differently: Take 40 mg by mouth daily before supper.   Newman Nip, NP Taking Active Self, Pharmacy Records  potassium chloride SA (KLOR-CON M) 20 MEQ tablet [id number] Yes Take 1 tablet (20 mEq total) by mouth daily. Duke Salvia, MD Taking Active Self, Pharmacy Records  pravastatin (PRAVACHOL) 40 MG tablet [id number] Yes Take 1 tablet (40 mg total) by mouth every evening. Duke Salvia, MD Taking Active Self, Pharmacy Records  rivaroxaban Carlena Hurl) 20 MG TABS tablet [id number] Yes Take 1 tablet (20 mg total) by mouth daily with supper. Graciella Freer, PA-C Taking Active Self, Pharmacy Records  spironolactone (ALDACTONE) 25 MG tablet [id number] Yes TAKE 1 TABLET(25 MG) BY MOUTH DAILY  Patient taking differently: Take 25 mg by mouth daily.   Duke Salvia, MD Taking Active Self, Pharmacy Records  sucralfate (CARAFATE) 1 g tablet [id number] Yes Take 1 g by mouth 2 (two) times daily. [provider]  Taking Active Self, Pharmacy Records  valsartan (DIOVAN) 40 MG tablet [id number] Yes Take 1 tablet (40 mg total) by mouth daily.  Patient taking differently: Take 10 mg by mouth at bedtime.   Wanda Plump, MD Taking Active Self, Pharmacy Records            Home Care and Equipment/Supplies: Were Home Health Services Ordered?: NA Any new equipment or medical supplies ordered?: NA  Functional Questionnaire: Do you need assistance with bathing/showering or dressing?: No Do you need assistance with meal preparation?: No Do you need assistance with eating?: No Do you have difficulty maintaining continence: No Do you need assistance with getting out of bed/getting out of a chair/moving?: No Do you have difficulty managing or taking your medications?: No  Follow up appointments reviewed: PCP Follow-up appointment confirmed?: Yes Date of PCP follow-up appointment?: 12/06/22 Follow-up Provider: [facility] Follow-up appointment confirmed?: Yes Date of Specialist follow-up appointment?: 12/11/22 Follow-Up Specialty Provider:: Rehab Do you need transportation to your follow-up appointment?: No Do you understand care options if your condition(s) worsen?: Yes-patient verbalized understanding  SDOH Interventions Today    Flowsheet Row Most Recent Value  SDOH Interventions   Food Insecurity Interventions Intervention  Not Indicated  Housing Interventions Intervention Not Indicated  Transportation Interventions Patient Resources (Friends/Family), Intervention Not Indicated  Utilities Interventions Intervention Not Indicated     TOC interventions discussed/reviewed: -Discussed/reviewed insurance/health plans benefits. RN discussed the [facility] meal plan and how to access -Doctor visit discussed/reviewed -PCP  -Doctor visits discussed/reviewed-Specialist -Provided Verbal Education: 30-day TOC program, nutrition, meds & their functions, symptom mgmt., fall/safety measures in the  home    Patient declined follow up outreach calls  Gean Maidens BSN RN Population Health- Transition of Care Team.  [facility] [phone]

## 2022-12-06 ENCOUNTER — Encounter: Payer: Self-pay | Admitting: Internal Medicine

## 2022-12-06 ENCOUNTER — Ambulatory Visit: Payer: Medicare Other | Admitting: Internal Medicine

## 2022-12-06 VITALS — BP 134/66 | HR 67 | Temp 98.0°F | Resp 18 | Ht 67.5 in | Wt 256.1 lb

## 2022-12-06 DIAGNOSIS — L03116 Cellulitis of left lower limb: Secondary | ICD-10-CM | POA: Diagnosis not present

## 2022-12-06 DIAGNOSIS — S239XXA Sprain of unspecified parts of thorax, initial encounter: Secondary | ICD-10-CM | POA: Diagnosis not present

## 2022-12-06 LAB — BASIC METABOLIC PANEL
BUN: 24 mg/dL — ABNORMAL HIGH (ref 6–23)
CO2: 32 meq/L (ref 19–32)
Calcium: 9.5 mg/dL (ref 8.4–10.5)
Chloride: 103 meq/L (ref 96–112)
Creatinine, Ser: 0.67 mg/dL (ref 0.40–1.20)
GFR: 85.77 mL/min (ref >60.00)
Glucose, Bld: 98 mg/dL (ref 70–99)
Potassium: 4.4 meq/L (ref 3.5–5.1)
Sodium: 141 meq/L (ref 135–145)

## 2022-12-06 MED ORDER — CEPHALEXIN 500 MG PO CAPS: 500.0000 mg | ORAL_CAPSULE | Freq: Four times a day (QID) | ORAL | 0 refills | Status: DC

## 2022-12-06 NOTE — Patient Instructions (Addendum)
I am I extending your antibiotic treatment for 5 additional days, we will send a prescription for cephalexin.  Leg elevation is very important.  If in the next couple of weeks your leg does not go back to normal and let us know.   Check the  blood pressure regularly Blood pressure goal:  between 110/65 and  135/85. If it is consistently higher or lower, let me know     GO TO THE LAB : Get the blood work     Next visit with me in 3 months.     Please schedule it at the front desk

## 2022-12-06 NOTE — Assessment & Plan Note (Signed)
Records from recent admission reviewed including notes, blood work and imaging Left leg cellulitis: In the context of previous injection for varicose vein treatment. Ultrasound at the hospital showed no DVT. She had IV antibiotics now on cephalexin.  She is improving but is not completely well. Also took temporarily some extra Lasix but is now back to her normal dosing. Plan: Extend cephalexin for 5 additional days, leg elevation, call if not gradually better. Primary hyperparathyroidism:At the last visit, PTH was elevated, subsequently had a L parathyroidectomy 09/14/2022.   Ca+ during the admission was slightly low, was recommended Tums at the hospital, check a BMP and an ionized calcium. Thoracic sprain: Had a fall, went to the ER November 20, at the time no x-rays were done but T spine XRs done @ recent admission >> no acute.  She is currently taking Tylenol and occasional Tylenol with codeine and for now we agree on no further evaluation. SIBO: GI diagnosed with SIBO recently and recommended antibiotics, she has not started given recent cellulitis, recommend to reach out to GI to see when or if is still appropriate to take p.o. antibiotics. RTC 3 months

## 2022-12-06 NOTE — Progress Notes (Signed)
Subjective:    Patient ID: Veronica Bradford, female    DOB: 12-Jan-1945, 77 y.o.   MRN: [id number]  DOS:  12/06/2022 Type of visit - description: Hospital follow-up  Admitted to hospital and discharge 12/02/2022. Presented with worsening L lower extremity tenderness, swelling and erythema in the context of recent injection at the vascular clinic for varicose vein treatment. Failed outpatient doxycycline. Was treated with ceftriaxone and vancomycin and subsequently discharged home on cephalexin.  Due to edema, Lasix and KCl doses temporarily increased.  Since she left the hospital, no fever or chills. Edema slowly improving but not resolved. Redness has decreased. Good compliance with Keflex.  She also liked to talk   about a fall she had 11/22/2022. Had knee pain, that this is slightly better. Still has thoracic back pain, without radiation, on and off, worse with turning her torso.  Also was prescribed antibiotics by GI and wonders if she should take them.   Review of Systems See above   Past Medical History:  Diagnosis Date   Atrial fibrillation (HCC)    Atrial fibrillation (HCC)    Back pain    CHF (congestive heart failure) (HCC)    grade 2 HF   Chronic sinusitis    s/p 2 surgeries remotely, Dr Ezzard Standing   Closed fracture of metatarsal of left foot    L foot, fifth metatarsal   Complication of anesthesia    woke up once during procedure   Dysrhythmia    a-fib   Ectopic pregnancy    Endometrial cancer (HCC) 2004   Early diagnosis   GERD (gastroesophageal reflux disease)    Hyperlipemia    Hypertension    IBS (irritable bowel syndrome) 08/13/2014   Dx 2015, diarrhea on-off, Dr Matthias Hughs    Lumbar radiculopathy 08/2012   MRI in 08/2012   Mild hyperparathyroidism (HCC)    Osteopenia    PAF (paroxysmal atrial fibrillation) (HCC)    Presence of permanent cardiac pacemaker    Sciatica of right side 08/2013   Received Depomedrol 80 mg injection   Sleep apnea 09/09/2013    Dx with OSA in 2015, by Dr. Graciela Husbands,   wears CPAPAPAP    Past Surgical History:  Procedure Laterality Date   ATRIAL FIBRILLATION ABLATION N/A 09/04/2017   Procedure: ATRIAL FIBRILLATION ABLATION;  Surgeon: Hillis Range, MD;  Location: [facility] INVASIVE CV LAB;  Service: Cardiovascular;  Laterality: N/A;   BIOPSY  03/11/2020   Procedure: BIOPSY;  Surgeon: Bernette Redbird, MD;  Location: [facility] ENDOSCOPY;  Service: Endoscopy;;  EGD and COLON   CARPAL TUNNEL RELEASE Right 06/17/2019   CARPAL TUNNEL RELEASE Left 11/2020   CHOLECYSTECTOMY  ~2006   COLONOSCOPY WITH PROPOFOL N/A 03/11/2020   Procedure: COLONOSCOPY WITH PROPOFOL;  Surgeon: Bernette Redbird, MD;  Location: [facility] ENDOSCOPY;  Service: Endoscopy;  Laterality: N/A;   DILATION AND CURETTAGE OF UTERUS     after SAB   ESOPHAGOGASTRODUODENOSCOPY  02/09/2014   Dr. Matthias Hughs   ESOPHAGOGASTRODUODENOSCOPY (EGD) WITH PROPOFOL N/A 03/11/2020   Procedure: ESOPHAGOGASTRODUODENOSCOPY (EGD) WITH PROPOFOL;  Surgeon: Bernette Redbird, MD;  Location: [facility] ENDOSCOPY;  Service: Endoscopy;  Laterality: N/A;   FINGER SURGERY Left    index   NASAL SINUS SURGERY     x 2 remotely, Dr Ezzard Standing   PACEMAKER IMPLANT N/A 09/13/2020   Procedure: PACEMAKER IMPLANT;  Surgeon: Duke Salvia, MD;  Location: [facility] INVASIVE CV LAB;  Service: Cardiovascular;  Laterality: N/A;   PARATHYROIDECTOMY Left 09/14/2022   Procedure: LEFT PARATHYROIDECTOMY;  Surgeon: Darnell Level, MD;  Location: [facility] ORS;  Service: General;  Laterality: Left;   TONSILLECTOMY     TOTAL ABDOMINAL HYSTERECTOMY W/ BILATERAL SALPINGOOPHORECTOMY     TOTAL HIP ARTHROPLASTY Left 07/18/2021   Procedure: TOTAL HIP ARTHROPLASTY ANTERIOR APPROACH;  Surgeon: Samson Frederic, MD;  Location: [facility] ORS;  Service: Orthopedics;  Laterality: Left;   TOTAL KNEE ARTHROPLASTY Right ~2008    Current Outpatient Medications  Medication Instructions   acetaminophen (TYLENOL) 500 mg, Oral, Every 6 hours PRN   acetaminophen-codeine (TYLENOL  #3) 300-30 MG tablet 1 tablet, Oral, At bedtime PRN   ARTIFICIAL TEAR SOLUTION OP 1 drop, Both Eyes, 4 times daily PRN   calcium carbonate (TUMS - DOSED IN MG ELEMENTAL CALCIUM) 500 MG chewable tablet 200 mg of elemental calcium, Oral, 3 times daily   cephALEXin (KEFLEX) 500 mg, Oral, 4 times daily   Chromium Picolinate 500 mcg, Oral, Daily after supper   Co Q 10 100 mg, Oral, Daily   CRANBERRY EXTRACT PO 2 tablets, Oral, Daily at bedtime   diltiazem (CARDIZEM) 30 MG tablet Take 1 tablet every 4 hours AS NEEDED for heart rate >100 as long as blood pressure >100.   dofetilide (TIKOSYN) 250 mcg, Oral, 2 times daily   estradiol (ESTRACE) 0.1 MG/GM vaginal cream 1 Applicatorful, Vaginal, 3 times weekly   ferrous fumarate (HEMOCYTE - 106 MG FE) 325 (106 Fe) MG TABS tablet 106 mg of iron, Oral, 2 times daily, Take apart from pantoprazole   fluticasone (FLONASE) 50 MCG/ACT nasal spray 1 spray, Each Nare, Daily   folic acid (FOLVITE) 800 mcg, Oral, Every morning   furosemide (LASIX) 40 mg, Oral, As needed   lidocaine (LIDODERM) 5 % 1 patch, Transdermal, Every 24 hours, Remove & Discard patch within 12 hours or as directed by MD   MAGNESIUM GLYCINATE PO 240 mg, Oral, Daily   olopatadine (PATANOL) 0.1 % ophthalmic solution 1 drop, Both Eyes, Daily PRN   pantoprazole (PROTONIX) 40 mg, Oral, 2 times daily   potassium chloride SA (KLOR-CON M) 20 MEQ tablet 20 mEq, Oral, Daily   pravastatin (PRAVACHOL) 40 mg, Oral, Every evening   rivaroxaban (XARELTO) 20 mg, Oral, Daily with supper   spironolactone (ALDACTONE) 25 MG tablet TAKE 1 TABLET(25 MG) BY MOUTH DAILY   sucralfate (CARAFATE) 1 g, Oral, 2 times daily   valsartan (DIOVAN) 40 mg, Oral, Daily       Objective:   Physical Exam BP 134/66   Pulse 67   Temp 98 F (36.7 C) (Oral)   Resp 18   Ht 5' 7.5" (1.715 m)   Wt 256 lb 2 oz (116.2 kg)   SpO2 99%   BMI 39.52 kg/m  General:   Well developed, NAD, BMI noted. HEENT:  Normocephalic . Face  symmetric, atraumatic Lungs:  CTA B Normal respiratory effort, no intercostal retractions, no accessory muscle use. Heart: RRR,  no murmur. Abdomen: Soft, nontender Lower extremities: See picture, L leg slightly swollen, skin is slightly warm, mildly TTP.     Skin: See picture  neurologic:  alert & oriented X3.  Speech normal, gait appropriate for age and unassisted Psych--  Cognition and judgment appear intact.  Cooperative with normal attention span and concentration.  Behavior appropriate. No anxious or depressed appearing.      Assessment     Assessment: Prediabetes HTN Hyperlipidemia Chronic lower extremity edema L>R (eval by previous pcp, related to varicose veins?) CV:  -- P. Atrial fibrillation, A. flutter: on xarelto flecainide-diltiazem  prn --Ablation 09/2017 --Pacemaker 09-2020 GI: Dr Matthias Hughs  --GERD (zantac or protonix prn), IBS, chronic diarrhea --GI records: Colonoscopies 1998, 2004, 2009 8 and 07-2013. Negative for polyps or IBD. + Diverticuli. EGD/cscope 11/2016; EGD/Cscope 03/11/2020   --Increased LFTs: CT abdomen 05/2016: Normal liver. 05/2017: WNL  Hepatitis B and C, Alpha 1 antitrypsin, anti-smooth muscle antibody, ANA trans-ferritin  OSA--- CPAP, Dr. Earl Gala Osteopenia:  Multiple  Dexas, last few:  T score 2009  -1.1; T score 2015  -0.7; T score 05/2017 -1.8 DJD --Chronic back pain-- tylenol #3 prn --- RF per PCP --history of epidurals in the past, MRI 2014: Scoliosis,DJD --CIPRO pre dental d/t knee replacement (rx by ortho) H/o endometrial cancer, found during a hysterectomy, no chemotherapy or XRT.  Primary hyperparathyroidism: Chronic hypercalcemia, L parathyroidectomy 09/14/2022  PLAN:  Records from recent admission reviewed including notes, blood work and imaging Left leg cellulitis: In the context of previous injection for varicose vein treatment. Ultrasound at the hospital showed no DVT. She had IV antibiotics now on cephalexin.  She is improving  but is not completely well. Also took temporarily some extra Lasix but is now back to her normal dosing. Plan: Extend cephalexin for 5 additional days, leg elevation, call if not gradually better. Primary hyperparathyroidism:At the last visit, PTH was elevated, subsequently had a L parathyroidectomy 09/14/2022.   Ca+ during the admission was slightly low, was recommended Tums at the hospital, check a BMP and an ionized calcium. Thoracic sprain: Had a fall, went to the ER November 20, at the time no x-rays were done but T spine XRs done @ recent admission >> no acute.  She is currently taking Tylenol and occasional Tylenol with codeine and for now we agree on no further evaluation. SIBO: GI diagnosed with SIBO recently and recommended antibiotics, she has not started given recent cellulitis, recommend to reach out to GI to see when or if is still appropriate to take p.o. antibiotics. RTC 3 months

## 2022-12-07 LAB — CALCIUM, IONIZED: Calcium, Ion: 5.2 mg/dL (ref 4.7–5.5)

## 2022-12-08 NOTE — Therapy (Signed)
OUTPATIENT PHYSICAL THERAPY LOWER EXTREMITY TREATMENT     Patient Name: Veronica Bradford MRN: [id number] DOB:[birth year], 77 y.o., female Today's Date: 12/11/2022  END OF SESSION:  PT End of Session - 12/11/22 1400     Visit Number 5    Date for PT Re-Evaluation 01/23/23    PT Start Time 1400    PT Stop Time 1445    PT Time Calculation (min) 45 min    Activity Tolerance Patient tolerated treatment well    Behavior During Therapy WFL for tasks assessed/performed                Past Medical History:  Diagnosis Date   Atrial fibrillation (HCC)    Atrial fibrillation (HCC)    Back pain    CHF (congestive heart failure) (HCC)    grade 2 HF   Chronic sinusitis    s/p 2 surgeries remotely, Dr Ezzard Standing   Closed fracture of metatarsal of left foot    L foot, fifth metatarsal   Complication of anesthesia    woke up once during procedure   Dysrhythmia    a-fib   Ectopic pregnancy    Endometrial cancer (HCC) 2004   Early diagnosis   GERD (gastroesophageal reflux disease)    Hyperlipemia    Hypertension    IBS (irritable bowel syndrome) 08/13/2014   Dx 2015, diarrhea on-off, Dr Matthias Hughs    Lumbar radiculopathy 08/2012   MRI in 08/2012   Mild hyperparathyroidism (HCC)    Osteopenia    PAF (paroxysmal atrial fibrillation) (HCC)    Presence of permanent cardiac pacemaker    Sciatica of right side 08/2013   Received Depomedrol 80 mg injection   Sleep apnea 09/09/2013   Dx with OSA in 2015, by Dr. Graciela Husbands,   wears CPAPAPAP   Past Surgical History:  Procedure Laterality Date   ATRIAL FIBRILLATION ABLATION N/A 09/04/2017   Procedure: ATRIAL FIBRILLATION ABLATION;  Surgeon: Hillis Range, MD;  Location: [facility];  Service: Cardiovascular;  Laterality: N/A;   BIOPSY  03/11/2020   Procedure: BIOPSY;  Surgeon: Bernette Redbird, MD;  Location: [facility];  Service: Endoscopy;;  EGD and COLON   CARPAL TUNNEL RELEASE Right 06/17/2019   CARPAL TUNNEL RELEASE Left 11/2020    CHOLECYSTECTOMY  ~2006   COLONOSCOPY WITH PROPOFOL N/A 03/11/2020   Procedure: COLONOSCOPY WITH PROPOFOL;  Surgeon: Bernette Redbird, MD;  Location: [facility];  Service: Endoscopy;  Laterality: N/A;   DILATION AND CURETTAGE OF UTERUS     after SAB   ESOPHAGOGASTRODUODENOSCOPY  02/09/2014   Dr. Matthias Hughs   ESOPHAGOGASTRODUODENOSCOPY (EGD) WITH PROPOFOL N/A 03/11/2020   Procedure: ESOPHAGOGASTRODUODENOSCOPY (EGD) WITH PROPOFOL;  Surgeon: Bernette Redbird, MD;  Location: [facility];  Service: Endoscopy;  Laterality: N/A;   FINGER SURGERY Left    index   NASAL SINUS SURGERY     x 2 remotely, Dr Ezzard Standing   PACEMAKER IMPLANT N/A 09/13/2020   Procedure: PACEMAKER IMPLANT;  Surgeon: Duke Salvia, MD;  Location: [facility];  Service: Cardiovascular;  Laterality: N/A;   PARATHYROIDECTOMY Left 09/14/2022   Procedure: LEFT PARATHYROIDECTOMY;  Surgeon: Darnell Level, MD;  Location: [facility];  Service: General;  Laterality: Left;   TONSILLECTOMY     TOTAL ABDOMINAL HYSTERECTOMY W/ BILATERAL SALPINGOOPHORECTOMY     TOTAL HIP ARTHROPLASTY Left 07/18/2021   Procedure: TOTAL HIP ARTHROPLASTY ANTERIOR APPROACH;  Surgeon: Samson Frederic, MD;  Location: [facility];  Service: Orthopedics;  Laterality: Left;   TOTAL KNEE ARTHROPLASTY Right ~  2008   Patient Active Problem List   Diagnosis Date Noted   Class 2 obesity 11/29/2022   Cellulitis of left lower extremity 11/29/2022   Hyperparathyroidism, primary (HCC) 09/03/2022   DOE (dyspnea on exertion) 05/04/2022   Paroxysmal A-fib (HCC) 01/03/2022   Atrial fibrillation and flutter (HCC) - Persistent 08/16/2021   Closed left hip fracture, initial encounter (HCC) 07/14/2021   Chronic diastolic CHF (congestive heart failure) (HCC) 07/14/2021   Pacemaker 12/14/2020   RBBB 08/09/2020   PVC's (premature ventricular contractions) 08/09/2020   Sinus bradycardia - junctional rhythm 08/09/2020   Carpal tunnel syndrome, right 07/08/2019   Vitamin D deficiency  01/20/2019   Macular pucker, right eye 11/18/2018   Multiple pulmonary nodules determined by computed tomography of lung 12/06/2017   Paroxysmal atrial fibrillation (HCC) 09/04/2017   Osteopenia 05/23/2017   Annual physical exam 04/14/2015   PCP NOTES >>>>> 12/23/2014   IBS (irritable bowel syndrome) 08/13/2014   Hyperglycemia    Chronic sinusitis    Endometrial cancer (HCC)    Back pain-- on tylenol #3 prn    GERD (gastroesophageal reflux disease)    HTN (hypertension) 09/09/2013   Diastolic dysfunction-grade 2 09/09/2013   Chronic anticoagulation 09/09/2013   Edema of both legs 09/09/2013   Dyslipidemia 09/09/2013   Obesity (BMI 30-39.9) 09/09/2013   OSA on CPAP 09/09/2013    PCP: Willow Ora  REFERRING PROVIDER: Trudee Grip  REFERRING DIAG: Gait disturbances and LE weakness  THERAPY DIAG:  Other abnormalities of gait and mobility  Muscle weakness (generalized)  Balance disorder  Fear of falling  Rationale for Evaluation and Treatment: Rehabilitation  ONSET DATE: 10/06/22  SUBJECTIVE:   SUBJECTIVE STATEMENT: I fell because I tripped up in some wiring. I fell forward on my knees and hit my forward. I went to the ED. This was 11/22/22. I had visible hematomas on both knees and had trouble walking. So I went back 11/27 and had to stay a few days.   PERTINENT HISTORY: THA- 07/18/21 TKA 2008 Pacemaker 2022 Trochanteric bursitis OA L knee  A fib CHF   PAIN:  Are you having pain? No 0/10  PRECAUTIONS: None  RED FLAGS: None   WEIGHT BEARING RESTRICTIONS: No  FALLS:  Has patient fallen in last 6 months? No  LIVING ENVIRONMENT: Lives with: lives with their family Lives in: House/apartment Stairs: Yes: Internal: 14 steps; can reach both   OCCUPATION: Retired Engineer, civil (consulting)   PLOF: Independent  PATIENT GOALS: to feel more stable    OBJECTIVE:  Note: Objective measures were completed at Evaluation unless otherwise noted.  DIAGNOSTIC FINDINGS: N/A     COGNITION: Overall cognitive status: Within functional limits for tasks assessed     SENSATION: WFL   MUSCLE LENGTH: Hamstrings: tightness BLE  POSTURE: rounded shoulders and forward head   LOWER EXTREMITY ROM: some hip flexor limitation due to weakness   LOWER EXTREMITY MMT: hip flexor 3+/5 and 4/5 generally    FUNCTIONAL TESTS:  5 times sit to stand: 21.15s from chair  Timed up and go (TUG): 10.19s Berg Balance Scale: 48/56    TODAY'S TREATMENT:  DATE:  12/11/22 NuStep L3x42mins  Calf stretch 15s x2  Leg press 20# 2x10 Walking on beam Side steps on to airex    11/17/22 Feet on ball K2C, rotation, bridge and iso abs Green tband clam shells Ball b/n knees squeeze On airex balance beam side stepping On airex cone toe touches On airex ball toss Nustep level 4 x 6 minutes Resisted gait 40# fwd and backward 20# leg curls 2x10  11/10/22 NuStep L5x63mins LE only  Leg ext 10# 2x10 HS curls 20# 2x10 STS x10, x10 on airex  Lateral band steps green x20 steps Seated hip abduction green x10, blue x10   11/02/22 Nustep L4 x6 minutes BLEs only  Bridges with red TB x12  Supine clams red TB x12 Figure four stretch 3x30 seconds B L hip flexor lifts off edge of table 2x5  In // bars: Tandem stance 3x30 seconds B  Narrow BOS blue foam pad 3x30 seconds    EVAL 10/31/22    PATIENT EDUCATION:  Education details: POC and HEP Person educated: Patient Education method: Explanation Education comprehension: verbalized understanding  HOME EXERCISE PROGRAM: Access Code: [email or web address] URL: [email or web address] Date: 10/31/2022 Prepared by: Cassie Freer  Exercises - Standing Tandem Balance with Counter Support  - 1 x daily - 7 x weekly - 2 reps - 30 hold - Single Leg Stance with Support  - 1 x daily - 7 x weekly - 2 reps - 10  hold - Sit to Stand  - 1 x daily - 7 x weekly - 2 sets - 10 reps - Standing Marching  - 1 x daily - 7 x weekly - 2 sets - 10 reps  ASSESSMENT:  CLINICAL IMPRESSION: Pt arrives reporting that she had a fall and was in the hospital for a few days. She had increase swelling and pitting edema in her LLE and is currently wearing compression socks. Some of the contusions have gone down. We worked on some gentle strengthening as she could tolerate and then focused on balance. Difficulty with walking on beam. Tried to do some step ups but she is unable to do today due to pain. Will continue to progress as able and tolerated.   OBJECTIVE IMPAIRMENTS: Abnormal gait, decreased activity tolerance, decreased balance, decreased coordination, difficulty walking, and decreased strength.   ACTIVITY LIMITATIONS: squatting, stairs, and locomotion level  REHAB POTENTIAL: Good  CLINICAL DECISION MAKING: Stable/uncomplicated  EVALUATION COMPLEXITY: Low  GOALS: Goals reviewed with patient? Yes  SHORT TERM GOALS: Target date: 12/12/22  Patient will be independent with initial HEP. Goal status: progressing 11/17/22, ongoing 12/11/22  2.  Patient will demonstrate decreased 5xSTS from chair to <15s Baseline: 21.15s  Goal status: progressing 11/17/22, 21s with pain 12/11/22 ongoing    LONG TERM GOALS: Target date: 01/23/23  Patient will be independent with advanced/ongoing HEP to improve outcomes and carryover.  Goal status: INITIAL  2.  Patient will be able to do SLS for 5-10s.  Baseline: 2s  Goal status: INITIAL  3.  Patient will be able to navigate stairs with reciprocal alternating pattern.  Baseline: does 1 steps at a time  Goal status: INITIAL   4.  Patient will demonstrate improved hip flexor strength to 5/5 Baseline: 3+/5  Goal status: INITIAL  5.  Patient will be able to do full tandem stance for 15s or better. Baseline: unable to get in position Goal status: INITIAL  6.  Patient will  score 53 on Berg Balance test to demonstrate lower risk of falls.  Baseline: 48 Goal status: INITIAL   PLAN:  PT FREQUENCY: 2x/week  PT DURATION: 12 weeks  PLANNED INTERVENTIONS: 97110-Therapeutic exercises, 97530- Therapeutic activity, 97112- Neuromuscular re-education, 97535- Self Care, [id number]- Manual therapy, [phone]- Gait training, Patient/Family education, Balance training, Stair training, Cryotherapy, and Moist heat  PLAN FOR NEXT SESSION: work on balance, hip flexor strengthening, regular HEP updates   Cassie Freer, PT, DPT 12/11/22 2:42 PM

## 2022-12-11 ENCOUNTER — Ambulatory Visit: Payer: Medicare Other | Attending: Orthopedic Surgery

## 2022-12-11 DIAGNOSIS — R2689 Other abnormalities of gait and mobility: Secondary | ICD-10-CM | POA: Insufficient documentation

## 2022-12-11 DIAGNOSIS — M6281 Muscle weakness (generalized): Secondary | ICD-10-CM | POA: Diagnosis not present

## 2022-12-11 DIAGNOSIS — F40298 Other specified phobia: Secondary | ICD-10-CM | POA: Insufficient documentation

## 2022-12-12 ENCOUNTER — Ambulatory Visit (INDEPENDENT_AMBULATORY_CARE_PROVIDER_SITE_OTHER): Payer: Medicare Other

## 2022-12-12 DIAGNOSIS — R001 Bradycardia, unspecified: Secondary | ICD-10-CM

## 2022-12-13 LAB — CUP PACEART REMOTE DEVICE CHECK
Battery Remaining Longevity: 93 mo
Battery Remaining Percentage: 80 %
Battery Voltage: 3.01 V
Brady Statistic AP VP Percent: 1 %
Brady Statistic AP VS Percent: 87 %
Brady Statistic AS VP Percent: 1 %
Brady Statistic AS VS Percent: 12 %
Brady Statistic RA Percent Paced: 88 %
Brady Statistic RV Percent Paced: 1 %
Date Time Interrogation Session: 20241210020015
Implantable Lead Connection Status: 753985
Implantable Lead Connection Status: 753985
Implantable Lead Implant Date: 20220912
Implantable Lead Implant Date: 20220912
Implantable Lead Location: 753859
Implantable Lead Location: 753860
Implantable Lead Model: 3830
Implantable Pulse Generator Implant Date: 20220912
Lead Channel Impedance Value: 390 Ohm
Lead Channel Impedance Value: 590 Ohm
Lead Channel Pacing Threshold Amplitude: 0.75 V
Lead Channel Pacing Threshold Amplitude: 1 V
Lead Channel Pacing Threshold Pulse Width: 0.4 ms
Lead Channel Pacing Threshold Pulse Width: 0.4 ms
Lead Channel Sensing Intrinsic Amplitude: 1.4 mV
Lead Channel Sensing Intrinsic Amplitude: 12 mV
Lead Channel Setting Pacing Amplitude: 2 V
Lead Channel Setting Pacing Amplitude: 2.5 V
Lead Channel Setting Pacing Pulse Width: 0.4 ms
Lead Channel Setting Sensing Sensitivity: 2 mV
Pulse Gen Model: 2272
Pulse Gen Serial Number: 3956585

## 2022-12-20 ENCOUNTER — Other Ambulatory Visit: Payer: Self-pay

## 2022-12-20 ENCOUNTER — Ambulatory Visit: Payer: Medicare Other

## 2022-12-20 DIAGNOSIS — F40298 Other specified phobia: Secondary | ICD-10-CM

## 2022-12-20 DIAGNOSIS — R600 Localized edema: Secondary | ICD-10-CM | POA: Diagnosis not present

## 2022-12-20 DIAGNOSIS — R2689 Other abnormalities of gait and mobility: Secondary | ICD-10-CM | POA: Diagnosis not present

## 2022-12-20 DIAGNOSIS — M6281 Muscle weakness (generalized): Secondary | ICD-10-CM

## 2022-12-20 DIAGNOSIS — I890 Lymphedema, not elsewhere classified: Secondary | ICD-10-CM | POA: Diagnosis not present

## 2022-12-20 NOTE — Therapy (Signed)
OUTPATIENT PHYSICAL THERAPY LOWER EXTREMITY TREATMENT     Patient Name: Veronica Bradford MRN: [id number] DOB:1945/09/12, 77 y.o., female Today's Date: 12/20/2022  END OF SESSION:  PT End of Session - 12/20/22 1600     Visit Number 6    Date for PT Re-Evaluation 01/23/23    PT Start Time 1600    PT Stop Time 1645    PT Time Calculation (min) 45 min    Activity Tolerance Patient tolerated treatment well    Behavior During Therapy WFL for tasks assessed/performed                 Past Medical History:  Diagnosis Date   Atrial fibrillation (HCC)    Atrial fibrillation (HCC)    Back pain    CHF (congestive heart failure) (HCC)    grade 2 HF   Chronic sinusitis    s/p 2 surgeries remotely, Dr Ezzard Standing   Closed fracture of metatarsal of left foot    L foot, fifth metatarsal   Complication of anesthesia    woke up once during procedure   Dysrhythmia    a-fib   Ectopic pregnancy    Endometrial cancer (HCC) 2004   Early diagnosis   GERD (gastroesophageal reflux disease)    Hyperlipemia    Hypertension    IBS (irritable bowel syndrome) 08/13/2014   Dx 2015, diarrhea on-off, Dr Matthias Hughs    Lumbar radiculopathy 08/2012   MRI in 08/2012   Mild hyperparathyroidism (HCC)    Osteopenia    PAF (paroxysmal atrial fibrillation) (HCC)    Presence of permanent cardiac pacemaker    Sciatica of right side 08/2013   Received Depomedrol 80 mg injection   Sleep apnea 09/09/2013   Dx with OSA in 2015, by Dr. Graciela Husbands,   wears CPAPAPAP   Past Surgical History:  Procedure Laterality Date   ATRIAL FIBRILLATION ABLATION N/A 09/04/2017   Procedure: ATRIAL FIBRILLATION ABLATION;  Surgeon: Hillis Range, MD;  Location: [facility];  Service: Cardiovascular;  Laterality: N/A;   BIOPSY  03/11/2020   Procedure: BIOPSY;  Surgeon: Bernette Redbird, MD;  Location: [facility];  Service: Endoscopy;;  EGD and COLON   CARPAL TUNNEL RELEASE Right 06/17/2019   CARPAL TUNNEL RELEASE Left  11/2020   CHOLECYSTECTOMY  ~2006   COLONOSCOPY WITH PROPOFOL N/A 03/11/2020   Procedure: COLONOSCOPY WITH PROPOFOL;  Surgeon: Bernette Redbird, MD;  Location: [facility];  Service: Endoscopy;  Laterality: N/A;   DILATION AND CURETTAGE OF UTERUS     after SAB   ESOPHAGOGASTRODUODENOSCOPY  02/09/2014   Dr. Matthias Hughs   ESOPHAGOGASTRODUODENOSCOPY (EGD) WITH PROPOFOL N/A 03/11/2020   Procedure: ESOPHAGOGASTRODUODENOSCOPY (EGD) WITH PROPOFOL;  Surgeon: Bernette Redbird, MD;  Location: [facility];  Service: Endoscopy;  Laterality: N/A;   FINGER SURGERY Left    index   NASAL SINUS SURGERY     x 2 remotely, Dr Ezzard Standing   PACEMAKER IMPLANT N/A 09/13/2020   Procedure: PACEMAKER IMPLANT;  Surgeon: Duke Salvia, MD;  Location: [facility];  Service: Cardiovascular;  Laterality: N/A;   PARATHYROIDECTOMY Left 09/14/2022   Procedure: LEFT PARATHYROIDECTOMY;  Surgeon: Darnell Level, MD;  Location: [facility];  Service: General;  Laterality: Left;   TONSILLECTOMY     TOTAL ABDOMINAL HYSTERECTOMY W/ BILATERAL SALPINGOOPHORECTOMY     TOTAL HIP ARTHROPLASTY Left 07/18/2021   Procedure: TOTAL HIP ARTHROPLASTY ANTERIOR APPROACH;  Surgeon: Samson Frederic, MD;  Location: [facility];  Service: Orthopedics;  Laterality: Left;   TOTAL KNEE ARTHROPLASTY  Right ~2008   Patient Active Problem List   Diagnosis Date Noted   Class 2 obesity 11/29/2022   Cellulitis of left lower extremity 11/29/2022   Hyperparathyroidism, primary (HCC) 09/03/2022   DOE (dyspnea on exertion) 05/04/2022   Paroxysmal A-fib (HCC) 01/03/2022   Atrial fibrillation and flutter (HCC) - Persistent 08/16/2021   Closed left hip fracture, initial encounter (HCC) 07/14/2021   Chronic diastolic CHF (congestive heart failure) (HCC) 07/14/2021   Pacemaker 12/14/2020   RBBB 08/09/2020   PVC's (premature ventricular contractions) 08/09/2020   Sinus bradycardia - junctional rhythm 08/09/2020   Carpal tunnel syndrome, right 07/08/2019   Vitamin D  deficiency 01/20/2019   Macular pucker, right eye 11/18/2018   Multiple pulmonary nodules determined by computed tomography of lung 12/06/2017   Paroxysmal atrial fibrillation (HCC) 09/04/2017   Osteopenia 05/23/2017   Annual physical exam 04/14/2015   PCP NOTES >>>>> 12/23/2014   IBS (irritable bowel syndrome) 08/13/2014   Hyperglycemia    Chronic sinusitis    Endometrial cancer (HCC)    Back pain-- on tylenol #3 prn    GERD (gastroesophageal reflux disease)    HTN (hypertension) 09/09/2013   Diastolic dysfunction-grade 2 09/09/2013   Chronic anticoagulation 09/09/2013   Edema of both legs 09/09/2013   Dyslipidemia 09/09/2013   Obesity (BMI 30-39.9) 09/09/2013   OSA on CPAP 09/09/2013    PCP: Willow Ora  REFERRING PROVIDER: Trudee Grip  REFERRING DIAG: Gait disturbances and LE weakness  THERAPY DIAG:  Other abnormalities of gait and mobility  Muscle weakness (generalized)  Balance disorder  Fear of falling  Rationale for Evaluation and Treatment: Rehabilitation  ONSET DATE: 10/06/22  SUBJECTIVE:   SUBJECTIVE STATEMENT: had to stay in bed for 2 weeks with cellulitis in leg and had been told that I needed to avoid excessive use of legs, need to keep them elevated, but saw vein specialist today and he advised me to continue to walk, etc.    PERTINENT HISTORY: THA- 07/18/21 TKA 2008 Pacemaker 2022 Trochanteric bursitis OA L knee  A fib CHF   PAIN:  Are you having pain? No 0/10  PRECAUTIONS: None  RED FLAGS: None   WEIGHT BEARING RESTRICTIONS: No  FALLS:  Has patient fallen in last 6 months? No  LIVING ENVIRONMENT: Lives with: lives with their family Lives in: House/apartment Stairs: Yes: Internal: 14 steps; can reach both   OCCUPATION: Retired Engineer, civil (consulting)   PLOF: Independent  PATIENT GOALS: to feel more stable    OBJECTIVE:  Note: Objective measures were completed at Evaluation unless otherwise noted.  DIAGNOSTIC FINDINGS: N/A     COGNITION: Overall cognitive status: Within functional limits for tasks assessed     SENSATION: WFL   MUSCLE LENGTH: Hamstrings: tightness BLE  POSTURE: rounded shoulders and forward head   LOWER EXTREMITY ROM: some hip flexor limitation due to weakness   LOWER EXTREMITY MMT: hip flexor 3+/5 and 4/5 generally    FUNCTIONAL TESTS:  5 times sit to stand: 21.15s from chair  Timed up and go (TUG): 10.19s Berg Balance Scale: 48/56    TODAY'S TREATMENT:  DATE:  12/20/22: B leg press 20# 2 sets 20 B ankle plantarflexion 20# 1 sets 15 Nustep level 5, 6 min Side stepping on airex balance pad in ll bars Side stepping with green theraband around thighs, 6 laps on ll bars  12/11/22 NuStep L3x51mins  Calf stretch 15s x2  Leg press 20# 2x10 Walking on beam Side steps on to airex    11/17/22 Feet on ball K2C, rotation, bridge and iso abs Green tband clam shells Ball b/n knees squeeze On airex balance beam side stepping On airex cone toe touches On airex ball toss Nustep level 4 x 6 minutes Resisted gait 40# fwd and backward 20# leg curls 2x10  11/10/22 NuStep L5x8mins LE only  Leg ext 10# 2x10 HS curls 20# 2x10 STS x10, x10 on airex  Lateral band steps green x20 steps Seated hip abduction green x10, blue x10   11/02/22 Nustep L4 x6 minutes BLEs only  Bridges with red TB x12  Supine clams red TB x12 Figure four stretch 3x30 seconds B L hip flexor lifts off edge of table 2x5  In // bars: Tandem stance 3x30 seconds B  Narrow BOS blue foam pad 3x30 seconds    EVAL 10/31/22    PATIENT EDUCATION:  Education details: POC and HEP Person educated: Patient Education method: Explanation Education comprehension: verbalized understanding  HOME EXERCISE PROGRAM: Access Code: [email or web address] URL: [email or web address] Date:  10/31/2022 Prepared by: Cassie Freer  Exercises - Standing Tandem Balance with Counter Support  - 1 x daily - 7 x weekly - 2 reps - 30 hold - Single Leg Stance with Support  - 1 x daily - 7 x weekly - 2 reps - 10 hold - Sit to Stand  - 1 x daily - 7 x weekly - 2 sets - 10 reps - Standing Marching  - 1 x daily - 7 x weekly - 2 sets - 10 reps  ASSESSMENT:  CLINICAL IMPRESSION: Pt is recovering from her cellulitis L LE.  She has been elevating her legs and utilizing compression stockings frequently.  She was able to tolerate LE strengthening and balance activities fairly well,  noted some increased respirations with the standing exercises.   Will continue to progress as able and tolerated.   OBJECTIVE IMPAIRMENTS: Abnormal gait, decreased activity tolerance, decreased balance, decreased coordination, difficulty walking, and decreased strength.   ACTIVITY LIMITATIONS: squatting, stairs, and locomotion level  REHAB POTENTIAL: Good  CLINICAL DECISION MAKING: Stable/uncomplicated  EVALUATION COMPLEXITY: Low  GOALS: Goals reviewed with patient? Yes  SHORT TERM GOALS: Target date: 12/12/22  Patient will be independent with initial HEP. Goal status: progressing 11/17/22, ongoing 12/11/22  2.  Patient will demonstrate decreased 5xSTS from chair to <15s Baseline: 21.15s  Goal status: progressing 11/17/22, 21s with pain 12/11/22 ongoing    LONG TERM GOALS: Target date: 01/23/23  Patient will be independent with advanced/ongoing HEP to improve outcomes and carryover.  Goal status: INITIAL  2.  Patient will be able to do SLS for 5-10s.  Baseline: 2s  Goal status: INITIAL  3.  Patient will be able to navigate stairs with reciprocal alternating pattern.  Baseline: does 1 steps at a time  Goal status: INITIAL   4.  Patient will demonstrate improved hip flexor strength to 5/5 Baseline: 3+/5  Goal status: INITIAL  5.  Patient will be able to do full tandem stance for 15s or  better. Baseline: unable to get in position Goal status: INITIAL  6.  Patient will score 53  on Berg Balance test to demonstrate lower risk of falls.  Baseline: 48 Goal status: INITIAL   PLAN:  PT FREQUENCY: 2x/week  PT DURATION: 12 weeks  PLANNED INTERVENTIONS: 97110-Therapeutic exercises, 97530- Therapeutic activity, 97112- Neuromuscular re-education, 97535- Self Care, [id number]- Manual therapy, [phone]- Gait training, Patient/Family education, Balance training, Stair training, Cryotherapy, and Moist heat  PLAN FOR NEXT SESSION: work on balance, hip flexor strengthening, regular HEP updates   Michaeljames Milnes, PT, DPT, OCS 12/20/22 4:55 PM

## 2022-12-28 ENCOUNTER — Encounter: Payer: Self-pay | Admitting: Physical Therapy

## 2022-12-28 ENCOUNTER — Ambulatory Visit: Payer: Medicare Other | Admitting: Physical Therapy

## 2022-12-28 DIAGNOSIS — M6281 Muscle weakness (generalized): Secondary | ICD-10-CM | POA: Diagnosis not present

## 2022-12-28 DIAGNOSIS — R2689 Other abnormalities of gait and mobility: Secondary | ICD-10-CM

## 2022-12-28 DIAGNOSIS — F40298 Other specified phobia: Secondary | ICD-10-CM

## 2022-12-28 NOTE — Therapy (Signed)
OUTPATIENT PHYSICAL THERAPY LOWER EXTREMITY TREATMENT     Patient Name: Veronica Bradford MRN: [id number] DOB:November 20, 1945, 77 y.o., female Today's Date: 12/28/2022  END OF SESSION:  PT End of Session - 12/28/22 1653     Visit Number 7    Date for PT Re-Evaluation 01/23/23    Authorization Type [facility] [facility]    PT Start Time 1655    PT Stop Time 1736    PT Time Calculation (min) 41 min    Activity Tolerance Patient tolerated treatment well    Behavior During Therapy WFL for tasks assessed/performed                 Past Medical History:  Diagnosis Date   Atrial fibrillation (HCC)    Atrial fibrillation (HCC)    Back pain    CHF (congestive heart failure) (HCC)    grade 2 HF   Chronic sinusitis    s/p 2 surgeries remotely, Dr Ezzard Standing   Closed fracture of metatarsal of left foot    L foot, fifth metatarsal   Complication of anesthesia    woke up once during procedure   Dysrhythmia    a-fib   Ectopic pregnancy    Endometrial cancer (HCC) 2004   Early diagnosis   GERD (gastroesophageal reflux disease)    Hyperlipemia    Hypertension    IBS (irritable bowel syndrome) 08/13/2014   Dx 2015, diarrhea on-off, Dr Matthias Hughs    Lumbar radiculopathy 08/2012   MRI in 08/2012   Mild hyperparathyroidism (HCC)    Osteopenia    PAF (paroxysmal atrial fibrillation) (HCC)    Presence of permanent cardiac pacemaker    Sciatica of right side 08/2013   Received Depomedrol 80 mg injection   Sleep apnea 09/09/2013   Dx with OSA in 2015, by Dr. Graciela Husbands,   wears CPAPAPAP   Past Surgical History:  Procedure Laterality Date   ATRIAL FIBRILLATION ABLATION N/A 09/04/2017   Procedure: ATRIAL FIBRILLATION ABLATION;  Surgeon: Hillis Range, MD;  [facility]: [facility];  Service: Cardiovascular;  Laterality: N/A;   BIOPSY  03/11/2020   Procedure: BIOPSY;  Surgeon: Bernette Redbird, MD;  Location: [facility];  Service: Endoscopy;;  EGD and COLON   CARPAL TUNNEL RELEASE Right 06/17/2019    CARPAL TUNNEL RELEASE Left 11/2020   CHOLECYSTECTOMY  ~2006   COLONOSCOPY WITH PROPOFOL N/A 03/11/2020   Procedure: COLONOSCOPY WITH PROPOFOL;  Surgeon: Bernette Redbird, MD;  Location: [facility];  Service: Endoscopy;  Laterality: N/A;   DILATION AND CURETTAGE OF UTERUS     after SAB   ESOPHAGOGASTRODUODENOSCOPY  02/09/2014   Dr. Matthias Hughs   ESOPHAGOGASTRODUODENOSCOPY (EGD) WITH PROPOFOL N/A 03/11/2020   Procedure: ESOPHAGOGASTRODUODENOSCOPY (EGD) WITH PROPOFOL;  Surgeon: Bernette Redbird, MD;  Location: [facility];  Service: Endoscopy;  Laterality: N/A;   FINGER SURGERY Left    index   NASAL SINUS SURGERY     x 2 remotely, Dr Ezzard Standing   PACEMAKER IMPLANT N/A 09/13/2020   Procedure: PACEMAKER IMPLANT;  Surgeon: Duke Salvia, MD;  Location: [facility];  Service: Cardiovascular;  Laterality: N/A;   PARATHYROIDECTOMY Left 09/14/2022   Procedure: LEFT PARATHYROIDECTOMY;  Surgeon: Darnell Level, MD;  Location: [facility];  Service: General;  Laterality: Left;   TONSILLECTOMY     TOTAL ABDOMINAL HYSTERECTOMY W/ BILATERAL SALPINGOOPHORECTOMY     TOTAL HIP ARTHROPLASTY Left 07/18/2021   Procedure: TOTAL HIP ARTHROPLASTY ANTERIOR APPROACH;  Surgeon: Samson Frederic, MD;  Location: [facility];  Service: Orthopedics;  Laterality: Left;   TOTAL KNEE ARTHROPLASTY Right ~2008   Patient Active Problem List   Diagnosis Date Noted   Class 2 obesity 11/29/2022   Cellulitis of left lower extremity 11/29/2022   Hyperparathyroidism, primary (HCC) 09/03/2022   DOE (dyspnea on exertion) 05/04/2022   Paroxysmal A-fib (HCC) 01/03/2022   Atrial fibrillation and flutter (HCC) - Persistent 08/16/2021   Closed left hip fracture, initial encounter (HCC) 07/14/2021   Chronic diastolic CHF (congestive heart failure) (HCC) 07/14/2021   Pacemaker 12/14/2020   RBBB 08/09/2020   PVC's (premature ventricular contractions) 08/09/2020   Sinus bradycardia - junctional rhythm 08/09/2020   Carpal tunnel syndrome, right  07/08/2019   Vitamin D deficiency 01/20/2019   Macular pucker, right eye 11/18/2018   Multiple pulmonary nodules determined by computed tomography of lung 12/06/2017   Paroxysmal atrial fibrillation (HCC) 09/04/2017   Osteopenia 05/23/2017   Annual physical exam 04/14/2015   PCP NOTES >>>>> 12/23/2014   IBS (irritable bowel syndrome) 08/13/2014   Hyperglycemia    Chronic sinusitis    Endometrial cancer (HCC)    Back pain-- on tylenol #3 prn    GERD (gastroesophageal reflux disease)    HTN (hypertension) 09/09/2013   Diastolic dysfunction-grade 2 09/09/2013   Chronic anticoagulation 09/09/2013   Edema of both legs 09/09/2013   Dyslipidemia 09/09/2013   Obesity (BMI 30-39.9) 09/09/2013   OSA on CPAP 09/09/2013    PCP: Willow Ora  REFERRING PROVIDER: Trudee Grip  REFERRING DIAG: Gait disturbances and LE weakness  THERAPY DIAG:  Other abnormalities of gait and mobility  Muscle weakness (generalized)  Balance disorder  Fear of falling  Rationale for Evaluation and Treatment: Rehabilitation  ONSET DATE: 10/06/22  SUBJECTIVE:   SUBJECTIVE STATEMENT: Patient seen by PT for mm weakness and loss of balance and falls, she was doing well however had a trip over a computer cord, then got cellulitis and had to stay off her feet, she does report that she has to do compression on the legs 2x/day for this.  A recent fall also seems to have increased the fear of falling  PERTINENT HISTORY: THA- 07/18/21 TKA 2008 Pacemaker 2022 Trochanteric bursitis OA L knee  A fib CHF   PAIN:  Are you having pain? No 0/10  PRECAUTIONS: None  RED FLAGS: None   WEIGHT BEARING RESTRICTIONS: No  FALLS:  Has patient fallen in last 6 months? No  LIVING ENVIRONMENT: Lives with: lives with their family Lives in: House/apartment Stairs: Yes: Internal: 14 steps; can reach both   OCCUPATION: Retired Engineer, civil (consulting)   PLOF: Independent  PATIENT GOALS: to feel more stable    OBJECTIVE:  Note:  Objective measures were completed at Evaluation unless otherwise noted.  DIAGNOSTIC FINDINGS: N/A    COGNITION: Overall cognitive status: Within functional limits for tasks assessed     SENSATION: WFL   MUSCLE LENGTH: Hamstrings: tightness BLE  POSTURE: rounded shoulders and forward head   LOWER EXTREMITY ROM: some hip flexor limitation due to weakness   LOWER EXTREMITY MMT: hip flexor 3+/5 and 4/5 generally    FUNCTIONAL TESTS:  5 times sit to stand: 21.15s from chair  Timed up and go (TUG): 10.19s Berg Balance Scale: 48/56    TODAY'S TREATMENT:  DATE:  12/28/22 Retested goals see below Did berg Nustep level 4 x 6 minutes Calf stretches Step turn reach On airex ball toss Side step on and off airex Airex balance beam tandem walk  12/20/22: B leg press 20# 2 sets 20 B ankle plantarflexion 20# 1 sets 15 Nustep level 5, 6 min Side stepping on airex balance pad in ll bars Side stepping with green theraband around thighs, 6 laps on ll bars  12/11/22 NuStep L3x24mins  Calf stretch 15s x2  Leg press 20# 2x10 Walking on beam Side steps on to airex    11/17/22 Feet on ball K2C, rotation, bridge and iso abs Green tband clam shells Ball b/n knees squeeze On airex balance beam side stepping On airex cone toe touches On airex ball toss Nustep level 4 x 6 minutes Resisted gait 40# fwd and backward 20# leg curls 2x10  11/10/22 NuStep L5x66mins LE only  Leg ext 10# 2x10 HS curls 20# 2x10 STS x10, x10 on airex  Lateral band steps green x20 steps Seated hip abduction green x10, blue x10   11/02/22 Nustep L4 x6 minutes BLEs only  Bridges with red TB x12  Supine clams red TB x12 Figure four stretch 3x30 seconds B L hip flexor lifts off edge of table 2x5  In // bars: Tandem stance 3x30 seconds B  Narrow BOS blue foam pad 3x30 seconds     EVAL 10/31/22    PATIENT EDUCATION:  Education details: POC and HEP Person educated: Patient Education method: Explanation Education comprehension: verbalized understanding  HOME EXERCISE PROGRAM: Access Code: [email or web address] URL: [email or web address] Date: 10/31/2022 Prepared by: Cassie Freer  Exercises - Standing Tandem Balance with Counter Support  - 1 x daily - 7 x weekly - 2 reps - 30 hold - Single Leg Stance with Support  - 1 x daily - 7 x weekly - 2 reps - 10 hold - Sit to Stand  - 1 x daily - 7 x weekly - 2 sets - 10 reps - Standing Marching  - 1 x daily - 7 x weekly - 2 sets - 10 reps  ASSESSMENT:  CLINICAL IMPRESSION: Pt is recovering from her cellulitis L LE.  She has been elevating her legs and utilizing compression stockings frequently.  She has difficulty with the stairs due to left knee pain. noted some increased respirations with the standing exercises.  She is making progress with the goals as noted below but the health complications of the cellulitis and the fall back at Thanksgiving has caused some back sliding with progress as well as fear of falling, Will continue to progress as able and tolerated.   OBJECTIVE IMPAIRMENTS: Abnormal gait, decreased activity tolerance, decreased balance, decreased coordination, difficulty walking, and decreased strength.   ACTIVITY LIMITATIONS: squatting, stairs, and locomotion level  REHAB POTENTIAL: Good  CLINICAL DECISION MAKING: Stable/uncomplicated  EVALUATION COMPLEXITY: Low  GOALS: Goals reviewed with patient? Yes  SHORT TERM GOALS: Target date: 12/12/22  Patient will be independent with initial HEP. Goal status: progressing 11/17/22, ongoing 12/11/22  2.  Patient will demonstrate decreased 5xSTS from chair to <15s Baseline: 21.15s  Goal status: progressing 11/17/22, 21s with pain 12/11/22 ongoing    LONG TERM GOALS: Target date: 01/23/23  Patient will be independent with advanced/ongoing HEP to  improve outcomes and carryover.  Goal status: progressing 12/28/22  2.  Patient will be able to do SLS for 5-10s.  Baseline: 2s  Goal status: 12/28/22 4 seconds  3.  Patient will be able  to navigate stairs with reciprocal alternating pattern.  Baseline: does 1 steps at a time  Goal status: 12/28/22 can go up 4" steps step over step, cannot go down step over step the left knee gives out  4.  Patient will demonstrate improved hip flexor strength to 5/5 Baseline: 3+/5 , 12/28/22 4-/5 bilaterally Goal status: progressing 12/28/22  5.  Patient will be able to do full tandem stance for 15s or better. Baseline: unable to get in position Goal status: can get in 1/2 tandem and hold 10 seconds  6.  Patient will score 53 on Berg Balance test to demonstrate lower risk of falls.  Baseline: 48 Goal status: 50/56 12/28/22 ongoing   PLAN:  PT FREQUENCY: 2x/week  PT DURATION: 12 weeks  PLANNED INTERVENTIONS: 97110-Therapeutic exercises, 97530- Therapeutic activity, 97112- Neuromuscular re-education, 97535- Self Care, [id number]- Manual therapy, [phone]- Gait training, Patient/Family education, Balance training, Stair training, Cryotherapy, and Moist heat  PLAN FOR NEXT SESSION: work on balance, hip flexor strengthening, regular HEP updates   Stacie Glaze, PT 12/28/22 4:57 PM

## 2023-01-08 ENCOUNTER — Ambulatory Visit: Payer: Medicare Other | Admitting: Physical Therapy

## 2023-01-10 ENCOUNTER — Encounter: Payer: Self-pay | Admitting: Physical Therapy

## 2023-01-10 ENCOUNTER — Ambulatory Visit: Payer: Medicare Other | Attending: Orthopedic Surgery | Admitting: Physical Therapy

## 2023-01-10 DIAGNOSIS — M6281 Muscle weakness (generalized): Secondary | ICD-10-CM | POA: Diagnosis not present

## 2023-01-10 DIAGNOSIS — R2689 Other abnormalities of gait and mobility: Secondary | ICD-10-CM | POA: Diagnosis not present

## 2023-01-10 DIAGNOSIS — F40298 Other specified phobia: Secondary | ICD-10-CM | POA: Diagnosis present

## 2023-01-10 NOTE — Therapy (Signed)
 OUTPATIENT PHYSICAL THERAPY LOWER EXTREMITY TREATMENT     Patient Name: Veronica Bradford MRN: [id number] DOB:23-Oct-1945, 78 y.o., female Today's Date: 01/10/2023  END OF SESSION:  PT End of Session - 01/10/23 1441     Visit Number 8    Date for PT Re-Evaluation 03/12/23    Authorization Type [facility] 1 of 9    PT Start Time 1440    PT Stop Time 1526    PT Time Calculation (min) 46 min    Activity Tolerance Patient tolerated treatment well    Behavior During Therapy WFL for tasks assessed/performed                 Past Medical History:  Diagnosis Date   Atrial fibrillation (HCC)    Atrial fibrillation (HCC)    Back pain    CHF (congestive heart failure) (HCC)    grade 2 HF   Chronic sinusitis    s/p 2 surgeries remotely, Dr Ethyl   Closed fracture of metatarsal of left foot    L foot, fifth metatarsal   Complication of anesthesia    woke up once during procedure   Dysrhythmia    a-fib   Ectopic pregnancy    Endometrial cancer (HCC) 2004   Early diagnosis   GERD (gastroesophageal reflux disease)    Hyperlipemia    Hypertension    IBS (irritable bowel syndrome) 08/13/2014   Dx 2015, diarrhea on-off, Dr Donnald    Lumbar radiculopathy 08/2012   MRI in 08/2012   Mild hyperparathyroidism (HCC)    Osteopenia    PAF (paroxysmal atrial fibrillation) (HCC)    Presence of permanent cardiac pacemaker    Sciatica of right side 08/2013   Received Depomedrol 80 mg injection   Sleep apnea 09/09/2013   Dx with OSA in 2015, by Dr. Fernande,   wears CPAPAPAP   Past Surgical History:  Procedure Laterality Date   ATRIAL FIBRILLATION ABLATION N/A 09/04/2017   Procedure: ATRIAL FIBRILLATION ABLATION;  Surgeon: Kelsie Agent, MD;  [facility]: [facility];  Service: Cardiovascular;  Laterality: N/A;   BIOPSY  03/11/2020   Procedure: BIOPSY;  Surgeon: Donnald Charleston, MD;  Location: [facility];  Service: Endoscopy;;  EGD and COLON   CARPAL TUNNEL RELEASE Right 06/17/2019    CARPAL TUNNEL RELEASE Left 11/2020   CHOLECYSTECTOMY  ~2006   COLONOSCOPY WITH PROPOFOL  N/A 03/11/2020   Procedure: COLONOSCOPY WITH PROPOFOL ;  Surgeon: Donnald Charleston, MD;  Location: [facility];  Service: Endoscopy;  Laterality: N/A;   DILATION AND CURETTAGE OF UTERUS     after SAB   ESOPHAGOGASTRODUODENOSCOPY  02/09/2014   Dr. Donnald   ESOPHAGOGASTRODUODENOSCOPY (EGD) WITH PROPOFOL  N/A 03/11/2020   Procedure: ESOPHAGOGASTRODUODENOSCOPY (EGD) WITH PROPOFOL ;  Surgeon: Donnald Charleston, MD;  Location: [facility];  Service: Endoscopy;  Laterality: N/A;   FINGER SURGERY Left    index   NASAL SINUS SURGERY     x 2 remotely, Dr Ethyl   PACEMAKER IMPLANT N/A 09/13/2020   Procedure: PACEMAKER IMPLANT;  Surgeon: Fernande Elspeth BROCKS, MD;  Location: [facility];  Service: Cardiovascular;  Laterality: N/A;   PARATHYROIDECTOMY Left 09/14/2022   Procedure: LEFT PARATHYROIDECTOMY;  Surgeon: Eletha Boas, MD;  Location: [facility];  Service: General;  Laterality: Left;   TONSILLECTOMY     TOTAL ABDOMINAL HYSTERECTOMY W/ BILATERAL SALPINGOOPHORECTOMY     TOTAL HIP ARTHROPLASTY Left 07/18/2021   Procedure: TOTAL HIP ARTHROPLASTY ANTERIOR APPROACH;  Surgeon: Fidel Rogue, MD;  Location: [facility];  Service: Orthopedics;  Laterality: Left;   TOTAL KNEE ARTHROPLASTY Right ~2008   Patient Active Problem List   Diagnosis Date Noted   Class 2 obesity 11/29/2022   Cellulitis of left lower extremity 11/29/2022   Hyperparathyroidism, primary (HCC) 09/03/2022   DOE (dyspnea on exertion) 05/04/2022   Paroxysmal A-fib (HCC) 01/03/2022   Atrial fibrillation and flutter (HCC) - Persistent 08/16/2021   Closed left hip fracture, initial encounter (HCC) 07/14/2021   Chronic diastolic CHF (congestive heart failure) (HCC) 07/14/2021   Pacemaker 12/14/2020   RBBB 08/09/2020   PVC's (premature ventricular contractions) 08/09/2020   Sinus bradycardia - junctional rhythm 08/09/2020   Carpal tunnel syndrome,  right 07/08/2019   Vitamin D  deficiency 01/20/2019   Macular pucker, right eye 11/18/2018   Multiple pulmonary nodules determined by computed tomography of lung 12/06/2017   Paroxysmal atrial fibrillation (HCC) 09/04/2017   Osteopenia 05/23/2017   Annual physical exam 04/14/2015   PCP NOTES >>>>> 12/23/2014   IBS (irritable bowel syndrome) 08/13/2014   Hyperglycemia    Chronic sinusitis    Endometrial cancer (HCC)    Back pain-- on tylenol  #3 prn    GERD (gastroesophageal reflux disease)    HTN (hypertension) 09/09/2013   Diastolic dysfunction-grade 2 09/09/2013   Chronic anticoagulation 09/09/2013   Edema of both legs 09/09/2013   Dyslipidemia 09/09/2013   Obesity (BMI 30-39.9) 09/09/2013   OSA on CPAP 09/09/2013    PCP: Aloysius Mech  REFERRING PROVIDER: Dempsey Lawn  REFERRING DIAG: Gait disturbances and LE weakness  THERAPY DIAG:  Other abnormalities of gait and mobility  Muscle weakness (generalized)  Fear of falling  Balance disorder  Rationale for Evaluation and Treatment: Rehabilitation  ONSET DATE: 10/06/22  SUBJECTIVE:   SUBJECTIVE STATEMENT: Patient denies any recent falls, she has had some stomach issues and had to cancel an appointment  PERTINENT HISTORY: THA- 07/18/21 TKA 2008 Pacemaker 2022 Trochanteric bursitis OA L knee  A fib CHF   PAIN:  Are you having pain? No 0/10  PRECAUTIONS: None  RED FLAGS: None   WEIGHT BEARING RESTRICTIONS: No  FALLS:  Has patient fallen in last 6 months? No  LIVING ENVIRONMENT: Lives with: lives with their family Lives in: House/apartment Stairs: Yes: Internal: 14 steps; can reach both   OCCUPATION: Retired engineer, civil (consulting)   PLOF: Independent  PATIENT GOALS: to feel more stable    OBJECTIVE:  Note: Objective measures were completed at Evaluation unless otherwise noted.  DIAGNOSTIC FINDINGS: N/A    COGNITION: Overall cognitive status: Within functional limits for tasks  assessed     SENSATION: WFL   MUSCLE LENGTH: Hamstrings: tightness BLE  POSTURE: rounded shoulders and forward head   LOWER EXTREMITY ROM: some hip flexor limitation due to weakness   LOWER EXTREMITY MMT: hip flexor 3+/5 and 4/5 generally    FUNCTIONAL TESTS:  5 times sit to stand: 21.15s from chair  Timed up and go (TUG): 10.19s Berg Balance Scale: 48/56    TODAY'S TREATMENT:  DATE:  01/10/23 Feet on ball K2C, rotation, small bridge, isometric abs LE stretches Nustep level 5 x 6 minutes Step turn touch On airex touching numbers called out on wall Resisted gait 40 # all directions On airex ball toss and volleyball Side step on and off airex  12/28/22 Retested goals see below Did berg Nustep level 4 x 6 minutes Calf stretches Step turn reach On airex ball toss Side step on and off airex Airex balance beam tandem walk  12/20/22: B leg press 20# 2 sets 20 B ankle plantarflexion 20# 1 sets 15 Nustep level 5, 6 min Side stepping on airex balance pad in ll bars Side stepping with green theraband around thighs, 6 laps on ll bars  12/11/22 NuStep L3x73mins  Calf stretch 15s x2  Leg press 20# 2x10 Walking on beam Side steps on to airex    11/17/22 Feet on ball K2C, rotation, bridge and iso abs Green tband clam shells Ball b/n knees squeeze On airex balance beam side stepping On airex cone toe touches On airex ball toss Nustep level 4 x 6 minutes Resisted gait 40# fwd and backward 20# leg curls 2x10  11/10/22 NuStep L5x35mins LE only  Leg ext 10# 2x10 HS curls 20# 2x10 STS x10, x10 on airex  Lateral band steps green x20 steps Seated hip abduction green x10, blue x10   11/02/22 Nustep L4 x6 minutes BLEs only  Bridges with red TB x12  Supine clams red TB x12 Figure four stretch 3x30 seconds B L hip flexor lifts off edge of  table 2x5  In // bars: Tandem stance 3x30 seconds B  Narrow BOS blue foam pad 3x30 seconds    EVAL 10/31/22    PATIENT EDUCATION:  Education details: POC and HEP Person educated: Patient Education method: Explanation Education comprehension: verbalized understanding  HOME EXERCISE PROGRAM: Access Code: [email or web address] URL: [email or web address] Date: 10/31/2022 Prepared by: Almetta Fam  Exercises - Standing Tandem Balance with Counter Support  - 1 x daily - 7 x weekly - 2 reps - 30 hold - Single Leg Stance with Support  - 1 x daily - 7 x weekly - 2 reps - 10 hold - Sit to Stand  - 1 x daily - 7 x weekly - 2 sets - 10 reps - Standing Marching  - 1 x daily - 7 x weekly - 2 sets - 10 reps  ASSESSMENT:  CLINICAL IMPRESSION: Pt is recovering from her cellulitis L LE.  She has been elevating her legs and utilizing compression stockings frequently.  She has some leg pain and tightness, reports that she is fearful of uneven surfaces, does report some visual issues.   She does get SOB easily.Will continue to progress as able and tolerated.   OBJECTIVE IMPAIRMENTS: Abnormal gait, decreased activity tolerance, decreased balance, decreased coordination, difficulty walking, and decreased strength.   ACTIVITY LIMITATIONS: squatting, stairs, and locomotion level  REHAB POTENTIAL: Good  CLINICAL DECISION MAKING: Stable/uncomplicated  EVALUATION COMPLEXITY: Low  GOALS: Goals reviewed with patient? Yes  SHORT TERM GOALS: Target date: 12/12/22  Patient will be independent with initial HEP. Goal status: progressing 11/17/22, ongoing 12/11/22  2.  Patient will demonstrate decreased 5xSTS from chair to <15s Baseline: 21.15s  Goal status: progressing 11/17/22, 21s with pain 12/11/22 ongoing    LONG TERM GOALS: Target date: 01/23/23  Patient will be independent with advanced/ongoing HEP to improve outcomes and carryover.  Goal status: progressing 12/28/22  2.  Patient will be  able to do SLS  for 5-10s.  Baseline: 2s  Goal status: 12/28/22 4 seconds  3.  Patient will be able to navigate stairs with reciprocal alternating pattern.  Baseline: does 1 steps at a time  Goal status: 12/28/22 can go up 4 steps step over step, cannot go down step over step the left knee gives out  4.  Patient will demonstrate improved hip flexor strength to 5/5 Baseline: 3+/5 , 12/28/22 4-/5 bilaterally Goal status: progressing 12/28/22  5.  Patient will be able to do full tandem stance for 15s or better. Baseline: unable to get in position Goal status: can get in 1/2 tandem and hold 10 seconds  6.  Patient will score 53 on Berg Balance test to demonstrate lower risk of falls.  Baseline: 48 Goal status: 50/56 12/28/22 ongoing   PLAN:  PT FREQUENCY: 2x/week  PT DURATION: 12 weeks  PLANNED INTERVENTIONS: 97110-Therapeutic exercises, 97530- Therapeutic activity, 97112- Neuromuscular re-education, 97535- Self Care, [id number]- Manual therapy, [phone]- Gait training, Patient/Family education, Balance training, Stair training, Cryotherapy, and Moist heat  PLAN FOR NEXT SESSION: work on balance, hip flexor strengthening, regular HEP updates   Ozell Mainland, PT 01/10/23 2:42 PM

## 2023-01-12 DIAGNOSIS — R197 Diarrhea, unspecified: Secondary | ICD-10-CM | POA: Diagnosis not present

## 2023-01-15 ENCOUNTER — Encounter: Payer: Self-pay | Admitting: Physical Therapy

## 2023-01-15 ENCOUNTER — Other Ambulatory Visit (HOSPITAL_COMMUNITY): Payer: Self-pay | Admitting: Physician Assistant

## 2023-01-15 ENCOUNTER — Ambulatory Visit: Payer: Medicare Other | Admitting: Physical Therapy

## 2023-01-15 DIAGNOSIS — I872 Venous insufficiency (chronic) (peripheral): Secondary | ICD-10-CM | POA: Diagnosis not present

## 2023-01-15 DIAGNOSIS — L570 Actinic keratosis: Secondary | ICD-10-CM | POA: Diagnosis not present

## 2023-01-15 DIAGNOSIS — R2689 Other abnormalities of gait and mobility: Secondary | ICD-10-CM

## 2023-01-15 DIAGNOSIS — F40298 Other specified phobia: Secondary | ICD-10-CM

## 2023-01-15 DIAGNOSIS — M6281 Muscle weakness (generalized): Secondary | ICD-10-CM

## 2023-01-15 DIAGNOSIS — D485 Neoplasm of uncertain behavior of skin: Secondary | ICD-10-CM | POA: Diagnosis not present

## 2023-01-15 DIAGNOSIS — L820 Inflamed seborrheic keratosis: Secondary | ICD-10-CM | POA: Diagnosis not present

## 2023-01-15 DIAGNOSIS — L821 Other seborrheic keratosis: Secondary | ICD-10-CM | POA: Diagnosis not present

## 2023-01-15 NOTE — Therapy (Signed)
 OUTPATIENT PHYSICAL THERAPY LOWER EXTREMITY TREATMENT     Patient Name: Veronica Bradford MRN: [id number] DOB:03-01-1945, 78 y.o., female Today's Date: 01/15/2023  END OF SESSION:  PT End of Session - 01/15/23 1745     Visit Number 9    Date for PT Re-Evaluation 03/12/23    Authorization Type [employer] [facility] 2 of 9    PT Start Time 1745    PT Stop Time 1830    PT Time Calculation (min) 45 min    Activity Tolerance Patient tolerated treatment well    Behavior During Therapy WFL for tasks assessed/performed                 Past Medical History:  Diagnosis Date   Atrial fibrillation (HCC)    Atrial fibrillation (HCC)    Back pain    CHF (congestive heart failure) (HCC)    grade 2 HF   Chronic sinusitis    s/p 2 surgeries remotely, Dr Ethyl   Closed fracture of metatarsal of left foot    L foot, fifth metatarsal   Complication of anesthesia    woke up once during procedure   Dysrhythmia    a-fib   Ectopic pregnancy    Endometrial cancer (HCC) 2004   Early diagnosis   GERD (gastroesophageal reflux disease)    Hyperlipemia    Hypertension    IBS (irritable bowel syndrome) 08/13/2014   Dx 2015, diarrhea on-off, Dr Donnald    Lumbar radiculopathy 08/2012   MRI in 08/2012   Mild hyperparathyroidism (HCC)    Osteopenia    PAF (paroxysmal atrial fibrillation) (HCC)    Presence of permanent cardiac pacemaker    Sciatica of right side 08/2013   Received Depomedrol 80 mg injection   Sleep apnea 09/09/2013   Dx with OSA in 2015, by Dr. Fernande,   wears CPAPAPAP   Past Surgical History:  Procedure Laterality Date   ATRIAL FIBRILLATION ABLATION N/A 09/04/2017   Procedure: ATRIAL FIBRILLATION ABLATION;  Surgeon: Kelsie Agent, MD;  [facility]: [facility];  Service: Cardiovascular;  Laterality: N/A;   BIOPSY  03/11/2020   Procedure: BIOPSY;  Surgeon: Donnald Charleston, MD;  Location: [facility];  Service: Endoscopy;;  EGD and COLON   CARPAL TUNNEL RELEASE Right 06/17/2019    CARPAL TUNNEL RELEASE Left 11/2020   CHOLECYSTECTOMY  ~2006   COLONOSCOPY WITH PROPOFOL  N/A 03/11/2020   Procedure: COLONOSCOPY WITH PROPOFOL ;  Surgeon: Donnald Charleston, MD;  Location: [facility];  Service: Endoscopy;  Laterality: N/A;   DILATION AND CURETTAGE OF UTERUS     after SAB   ESOPHAGOGASTRODUODENOSCOPY  02/09/2014   Dr. Donnald   ESOPHAGOGASTRODUODENOSCOPY (EGD) WITH PROPOFOL  N/A 03/11/2020   Procedure: ESOPHAGOGASTRODUODENOSCOPY (EGD) WITH PROPOFOL ;  Surgeon: Donnald Charleston, MD;  Location: [facility];  Service: Endoscopy;  Laterality: N/A;   FINGER SURGERY Left    index   NASAL SINUS SURGERY     x 2 remotely, Dr Ethyl   PACEMAKER IMPLANT N/A 09/13/2020   Procedure: PACEMAKER IMPLANT;  Surgeon: Fernande Elspeth BROCKS, MD;  Location: [facility] INVASIVE CV [facility];  Service: Cardiovascular;  Laterality: N/A;   PARATHYROIDECTOMY Left 09/14/2022   Procedure: LEFT PARATHYROIDECTOMY;  Surgeon: Eletha Boas, MD;  Location: [facility];  Service: General;  Laterality: Left;   TONSILLECTOMY     TOTAL ABDOMINAL HYSTERECTOMY W/ BILATERAL SALPINGOOPHORECTOMY     TOTAL HIP ARTHROPLASTY Left 07/18/2021   Procedure: TOTAL HIP ARTHROPLASTY ANTERIOR APPROACH;  Surgeon: Fidel Rogue, MD;  Location: [facility];  Service: Orthopedics;  Laterality: Left;   TOTAL KNEE ARTHROPLASTY Right ~2008   Patient Active Problem List   Diagnosis Date Noted   Class 2 obesity 11/29/2022   Cellulitis of left lower extremity 11/29/2022   Hyperparathyroidism, primary (HCC) 09/03/2022   DOE (dyspnea on exertion) 05/04/2022   Paroxysmal A-fib (HCC) 01/03/2022   Atrial fibrillation and flutter (HCC) - Persistent 08/16/2021   Closed left hip fracture, initial encounter (HCC) 07/14/2021   Chronic diastolic CHF (congestive heart failure) (HCC) 07/14/2021   Pacemaker 12/14/2020   RBBB 08/09/2020   PVC's (premature ventricular contractions) 08/09/2020   Sinus bradycardia - junctional rhythm 08/09/2020   Carpal tunnel syndrome,  right 07/08/2019   Vitamin D  deficiency 01/20/2019   Macular pucker, right eye 11/18/2018   Multiple pulmonary nodules determined by computed tomography of lung 12/06/2017   Paroxysmal atrial fibrillation (HCC) 09/04/2017   Osteopenia 05/23/2017   Annual physical exam 04/14/2015   PCP NOTES >>>>> 12/23/2014   IBS (irritable bowel syndrome) 08/13/2014   Hyperglycemia    Chronic sinusitis    Endometrial cancer (HCC)    Back pain-- on tylenol  #3 prn    GERD (gastroesophageal reflux disease)    HTN (hypertension) 09/09/2013   Diastolic dysfunction-grade 2 09/09/2013   Chronic anticoagulation 09/09/2013   Edema of both legs 09/09/2013   Dyslipidemia 09/09/2013   Obesity (BMI 30-39.9) 09/09/2013   OSA on CPAP 09/09/2013    PCP: Aloysius Mech  REFERRING PROVIDER: Dempsey Lawn  REFERRING DIAG: Gait disturbances and LE weakness  THERAPY DIAG:  Other abnormalities of gait and mobility  Muscle weakness (generalized)  Fear of falling  Balance disorder  Rationale for Evaluation and Treatment: Rehabilitation  ONSET DATE: 10/06/22  SUBJECTIVE:   SUBJECTIVE STATEMENT: Patient reports that she was tired and a little fatigued after the last visit PERTINENT HISTORY: THA- 07/18/21 TKA 2008 Pacemaker 2022 Trochanteric bursitis OA L knee  A fib CHF   PAIN:  Are you having pain? No 0/10  PRECAUTIONS: None  RED FLAGS: None   WEIGHT BEARING RESTRICTIONS: No  FALLS:  Has patient fallen in last 6 months? No  LIVING ENVIRONMENT: Lives with: lives with their family Lives in: House/apartment Stairs: Yes: Internal: 14 steps; can reach both   OCCUPATION: Retired engineer, civil (consulting)   PLOF: Independent  PATIENT GOALS: to feel more stable    OBJECTIVE:  Note: Objective measures were completed at Evaluation unless otherwise noted.  DIAGNOSTIC FINDINGS: N/A    COGNITION: Overall cognitive status: Within functional limits for tasks assessed     SENSATION: WFL   MUSCLE  LENGTH: Hamstrings: tightness BLE  POSTURE: rounded shoulders and forward head   LOWER EXTREMITY ROM: some hip flexor limitation due to weakness   LOWER EXTREMITY MMT: hip flexor 3+/5 and 4/5 generally    FUNCTIONAL TESTS:  5 times sit to stand: 21.15s from chair  Timed up and go (TUG): 10.19s Berg Balance Scale: 48/56    TODAY'S TREATMENT:  DATE:  01/15/23 Nustep Level 5 x 6 minutes Leg curls 20# 2x10 Calf stretches Tmill pushes 15 seconds x 3 On airex 5# straight arm pulls On Bosu balance and reaching  On airex reaching for numbers Step turn reach Resisted gait forward 1 lap Ball kicks On airex ball toss and volleyball Cone toe touches  01/10/23 Feet on ball K2C, rotation, small bridge, isometric abs LE stretches Nustep level 5 x 6 minutes Step turn touch On airex touching numbers called out on wall Resisted gait 40 # all directions On airex ball toss and volleyball Side step on and off airex  12/28/22 Retested goals see below Did berg Nustep level 4 x 6 minutes Calf stretches Step turn reach On airex ball toss Side step on and off airex Airex balance beam tandem walk  12/20/22: B leg press 20# 2 sets 20 B ankle plantarflexion 20# 1 sets 15 Nustep level 5, 6 min Side stepping on airex balance pad in ll bars Side stepping with green theraband around thighs, 6 laps on ll bars  12/11/22 NuStep L3x71mins  Calf stretch 15s x2  Leg press 20# 2x10 Walking on beam Side steps on to airex    11/17/22 Feet on ball K2C, rotation, bridge and iso abs Green tband clam shells Ball b/n knees squeeze On airex balance beam side stepping On airex cone toe touches On airex ball toss Nustep level 4 x 6 minutes Resisted gait 40# fwd and backward 20# leg curls 2x10  11/10/22 NuStep L5x79mins LE only  Leg ext 10# 2x10 HS curls 20# 2x10 STS  x10, x10 on airex  Lateral band steps green x20 steps Seated hip abduction green x10, blue x10   11/02/22 Nustep L4 x6 minutes BLEs only  Bridges with red TB x12  Supine clams red TB x12 Figure four stretch 3x30 seconds B L hip flexor lifts off edge of table 2x5  In // bars: Tandem stance 3x30 seconds B  Narrow BOS blue foam pad 3x30 seconds    EVAL 10/31/22    PATIENT EDUCATION:  Education details: POC and HEP Person educated: Patient Education method: Explanation Education comprehension: verbalized understanding  HOME EXERCISE PROGRAM: Access Code: [email or web address] URL: [email or web address] Date: 10/31/2022 Prepared by: Almetta Fam  Exercises - Standing Tandem Balance with Counter Support  - 1 x daily - 7 x weekly - 2 reps - 30 hold - Single Leg Stance with Support  - 1 x daily - 7 x weekly - 2 reps - 10 hold - Sit to Stand  - 1 x daily - 7 x weekly - 2 sets - 10 reps - Standing Marching  - 1 x daily - 7 x weekly - 2 sets - 10 reps  ASSESSMENT:  CLINICAL IMPRESSION: Patient really did well with a lot of endurance and balance activities.  She was very scare of the Bosu but did well.  On the uneven surfaces she does struggle and requires some CGA at times but typically will right her self.  She does get short of breath easily but recovers after sitting less than 45 seconds  OBJECTIVE IMPAIRMENTS: Abnormal gait, decreased activity tolerance, decreased balance, decreased coordination, difficulty walking, and decreased strength.   ACTIVITY LIMITATIONS: squatting, stairs, and locomotion level  REHAB POTENTIAL: Good  CLINICAL DECISION MAKING: Stable/uncomplicated  EVALUATION COMPLEXITY: Low  GOALS: Goals reviewed with patient? Yes  SHORT TERM GOALS: Target date: 12/12/22  Patient will be independent with initial HEP. Goal status: met 01/15/23  2.  Patient will  demonstrate decreased 5xSTS from chair to <15s Baseline: 21.15s  Goal status: progressing  11/17/22, 21s with pain 12/11/22 ongoing    LONG TERM GOALS: Target date: 01/23/23  Patient will be independent with advanced/ongoing HEP to improve outcomes and carryover.  Goal status: progressing 12/28/22  2.  Patient will be able to do SLS for 5-10s.  Baseline: 2s  Goal status: 12/28/22 4 seconds  3.  Patient will be able to navigate stairs with reciprocal alternating pattern.  Baseline: does 1 steps at a time  Goal status: 12/28/22 can go up 4 steps step over step, cannot go down step over step the left knee gives out  4.  Patient will demonstrate improved hip flexor strength to 5/5 Baseline: 3+/5 , 12/28/22 4-/5 bilaterally Goal status: progressing 12/28/22  5.  Patient will be able to do full tandem stance for 15s or better. Baseline: unable to get in position Goal status: can get in 1/2 tandem and hold 10 seconds  6.  Patient will score 53 on Berg Balance test to demonstrate lower risk of falls.  Baseline: 48 Goal status: 50/56 12/28/22 ongoing   PLAN:  PT FREQUENCY: 2x/week  PT DURATION: 12 weeks  PLANNED INTERVENTIONS: 97110-Therapeutic exercises, 97530- Therapeutic activity, 97112- Neuromuscular re-education, 97535- Self Care, [id number]- Manual therapy, [phone]- Gait training, Patient/Family education, Balance training, Stair training, Cryotherapy, and Moist heat  PLAN FOR NEXT SESSION: work on balance, hip flexor strengthening, regular HEP updates   Ozell Mainland, PT 01/15/23 5:46 PM

## 2023-01-22 ENCOUNTER — Encounter: Payer: Self-pay | Admitting: Physical Therapy

## 2023-01-22 ENCOUNTER — Ambulatory Visit: Payer: Medicare Other | Admitting: Physical Therapy

## 2023-01-22 ENCOUNTER — Other Ambulatory Visit (HOSPITAL_COMMUNITY): Payer: Self-pay | Admitting: Internal Medicine

## 2023-01-22 DIAGNOSIS — M6281 Muscle weakness (generalized): Secondary | ICD-10-CM | POA: Diagnosis not present

## 2023-01-22 DIAGNOSIS — F40298 Other specified phobia: Secondary | ICD-10-CM

## 2023-01-22 DIAGNOSIS — R2689 Other abnormalities of gait and mobility: Secondary | ICD-10-CM | POA: Diagnosis not present

## 2023-01-22 NOTE — Progress Notes (Signed)
Remote pacemaker transmission.   

## 2023-01-22 NOTE — Addendum Note (Signed)
Addended by: Elease Etienne A on: 01/22/2023 09:19 AM   Modules accepted: Orders

## 2023-01-22 NOTE — Therapy (Signed)
OUTPATIENT PHYSICAL THERAPY LOWER EXTREMITY TREATMENT Progress Note Reporting Period 10/31/22 to 01/22/23  See note below for Objective Data and Assessment of Progress/Goals.        Patient Name: Veronica Bradford MRN: [id number] DOB:1945/12/30, 78 y.o., female Today's Date: 01/22/2023  END OF SESSION:  PT End of Session - 01/22/23 1615     Visit Number 10    Date for PT Re-Evaluation 03/12/23    Authorization Type [facility] [facility] 3 of 9    PT Start Time 1615    PT Stop Time 1700    PT Time Calculation (min) 45 min    Activity Tolerance Patient tolerated treatment well    Behavior During Therapy WFL for tasks assessed/performed                 Past Medical History:  Diagnosis Date   Atrial fibrillation (HCC)    Atrial fibrillation (HCC)    Back pain    CHF (congestive heart failure) (HCC)    grade 2 HF   Chronic sinusitis    s/p 2 surgeries remotely, Dr Ezzard Standing   Closed fracture of metatarsal of left foot    L foot, fifth metatarsal   Complication of anesthesia    woke up once during procedure   Dysrhythmia    a-fib   Ectopic pregnancy    Endometrial cancer (HCC) 2004   Early diagnosis   GERD (gastroesophageal reflux disease)    Hyperlipemia    Hypertension    IBS (irritable bowel syndrome) 08/13/2014   Dx 2015, diarrhea on-off, Dr Matthias Hughs    Lumbar radiculopathy 08/2012   MRI in 08/2012   Mild hyperparathyroidism (HCC)    Osteopenia    PAF (paroxysmal atrial fibrillation) (HCC)    Presence of permanent cardiac pacemaker    Sciatica of right side 08/2013   Received Depomedrol 80 mg injection   Sleep apnea 09/09/2013   Dx with OSA in 2015, by Dr. Graciela Husbands,   wears CPAPAPAP   Past Surgical History:  Procedure Laterality Date   ATRIAL FIBRILLATION ABLATION N/A 09/04/2017   Procedure: ATRIAL FIBRILLATION ABLATION;  Surgeon: Hillis Range, MD;  [facility]: [facility];  Service: Cardiovascular;  Laterality: N/A;   BIOPSY  03/11/2020   Procedure: BIOPSY;   Surgeon: Bernette Redbird, MD;  Location: [facility];  Service: Endoscopy;;  EGD and COLON   CARPAL TUNNEL RELEASE Right 06/17/2019   CARPAL TUNNEL RELEASE Left 11/2020   CHOLECYSTECTOMY  ~2006   COLONOSCOPY WITH PROPOFOL N/A 03/11/2020   Procedure: COLONOSCOPY WITH PROPOFOL;  Surgeon: Bernette Redbird, MD;  Location: [facility];  Service: Endoscopy;  Laterality: N/A;   DILATION AND CURETTAGE OF UTERUS     after SAB   ESOPHAGOGASTRODUODENOSCOPY  02/09/2014   Dr. Matthias Hughs   ESOPHAGOGASTRODUODENOSCOPY (EGD) WITH PROPOFOL N/A 03/11/2020   Procedure: ESOPHAGOGASTRODUODENOSCOPY (EGD) WITH PROPOFOL;  Surgeon: Bernette Redbird, MD;  Location: [facility];  Service: Endoscopy;  Laterality: N/A;   FINGER SURGERY Left    index   NASAL SINUS SURGERY     x 2 remotely, Dr Ezzard Standing   PACEMAKER IMPLANT N/A 09/13/2020   Procedure: PACEMAKER IMPLANT;  Surgeon: Duke Salvia, MD;  Location: [facility] CV [facility];  Service: Cardiovascular;  Laterality: N/A;   PARATHYROIDECTOMY Left 09/14/2022   Procedure: LEFT PARATHYROIDECTOMY;  Surgeon: Darnell Level, MD;  Location: [facility];  Service: General;  Laterality: Left;   TONSILLECTOMY     TOTAL ABDOMINAL HYSTERECTOMY W/ BILATERAL SALPINGOOPHORECTOMY     TOTAL HIP  ARTHROPLASTY Left 07/18/2021   Procedure: TOTAL HIP ARTHROPLASTY ANTERIOR APPROACH;  Surgeon: Samson Frederic, MD;  Location: [facility];  Service: Orthopedics;  Laterality: Left;   TOTAL KNEE ARTHROPLASTY Right ~2008   Patient Active Problem List   Diagnosis Date Noted   Class 2 obesity 11/29/2022   Cellulitis of left lower extremity 11/29/2022   Hyperparathyroidism, primary (HCC) 09/03/2022   DOE (dyspnea on exertion) 05/04/2022   Paroxysmal A-fib (HCC) 01/03/2022   Atrial fibrillation and flutter (HCC) - Persistent 08/16/2021   Closed left hip fracture, initial encounter (HCC) 07/14/2021   Chronic diastolic CHF (congestive heart failure) (HCC) 07/14/2021   Pacemaker 12/14/2020   RBBB 08/09/2020    PVC's (premature ventricular contractions) 08/09/2020   Sinus bradycardia - junctional rhythm 08/09/2020   Carpal tunnel syndrome, right 07/08/2019   Vitamin D deficiency 01/20/2019   Macular pucker, right eye 11/18/2018   Multiple pulmonary nodules determined by computed tomography of lung 12/06/2017   Paroxysmal atrial fibrillation (HCC) 09/04/2017   Osteopenia 05/23/2017   Annual physical exam 04/14/2015   PCP NOTES >>>>> 12/23/2014   IBS (irritable bowel syndrome) 08/13/2014   Hyperglycemia    Chronic sinusitis    Endometrial cancer (HCC)    Back pain-- on tylenol #3 prn    GERD (gastroesophageal reflux disease)    HTN (hypertension) 09/09/2013   Diastolic dysfunction-grade 2 09/09/2013   Chronic anticoagulation 09/09/2013   Edema of both legs 09/09/2013   Dyslipidemia 09/09/2013   Obesity (BMI 30-39.9) 09/09/2013   OSA on CPAP 09/09/2013    PCP: Willow Ora  REFERRING PROVIDER: Trudee Grip  REFERRING DIAG: Gait disturbances and LE weakness  THERAPY DIAG:  Other abnormalities of gait and mobility  Muscle weakness (generalized)  Fear of falling  Balance disorder  Rationale for Evaluation and Treatment: Rehabilitation  ONSET DATE: 10/06/22  SUBJECTIVE:   SUBJECTIVE STATEMENT: Patient reports feeling like A-fib today, reports that she can do exercises but needs to keep heart rate lower PERTINENT HISTORY: THA- 07/18/21 TKA 2008 Pacemaker 2022 Trochanteric bursitis OA L knee  A fib CHF   PAIN:  Are you having pain? No 0/10  PRECAUTIONS: None  RED FLAGS: None   WEIGHT BEARING RESTRICTIONS: No  FALLS:  Has patient fallen in last 6 months? No  LIVING ENVIRONMENT: Lives with: lives with their family Lives in: House/apartment Stairs: Yes: Internal: 14 steps; can reach both   OCCUPATION: Retired Engineer, civil (consulting)   PLOF: Independent  PATIENT GOALS: to feel more stable    OBJECTIVE:  Note: Objective measures were completed at Evaluation unless otherwise  noted.  DIAGNOSTIC FINDINGS: N/A    COGNITION: Overall cognitive status: Within functional limits for tasks assessed     SENSATION: WFL   MUSCLE LENGTH: Hamstrings: tightness BLE  POSTURE: rounded shoulders and forward head   LOWER EXTREMITY ROM: some hip flexor limitation due to weakness   LOWER EXTREMITY MMT: hip flexor 3+/5 and 4/5 generally    FUNCTIONAL TESTS:  5 times sit to stand: 21.15s from chair  Timed up and go (TUG): 10.19s Berg Balance Scale: 48/56    TODAY'S TREATMENT:  DATE:  01/22/23 Bike level 3 x 3 minutes Side stepping on airex balance beam Feet on ball K2C, rotation, bridge and isometric abs Green tband clamshells Ball b/n knees squeeze Black tband supine hip extension On airex ball toss, volley ball and head turns  01/15/23 Nustep Level 5 x 6 minutes Passive stretch LE's Leg curls 20# 2x10 Calf stretches Tmill pushes 15 seconds x 3 On airex 5# straight arm pulls On Bosu balance and reaching  On airex reaching for numbers Step turn reach Resisted gait forward 1 lap Ball kicks On airex ball toss and volleyball Cone toe touches  01/10/23 Feet on ball K2C, rotation, small bridge, isometric abs LE stretches Nustep level 5 x 6 minutes Step turn touch On airex touching numbers called out on wall Resisted gait 40 # all directions On airex ball toss and volleyball Side step on and off airex  12/28/22 Retested goals see below Did berg Nustep level 4 x 6 minutes Calf stretches Step turn reach On airex ball toss Side step on and off airex Airex balance beam tandem walk  12/20/22: B leg press 20# 2 sets 20 B ankle plantarflexion 20# 1 sets 15 Nustep level 5, 6 min Side stepping on airex balance pad in ll bars Side stepping with green theraband around thighs, 6 laps on ll bars  12/11/22 NuStep L3x53mins  Calf  stretch 15s x2  Leg press 20# 2x10 Walking on beam Side steps on to airex    11/17/22 Feet on ball K2C, rotation, bridge and iso abs Green tband clam shells Ball b/n knees squeeze On airex balance beam side stepping On airex cone toe touches On airex ball toss Nustep level 4 x 6 minutes Resisted gait 40# fwd and backward 20# leg curls 2x10  11/10/22 NuStep L5x9mins LE only  Leg ext 10# 2x10 HS curls 20# 2x10 STS x10, x10 on airex  Lateral band steps green x20 steps Seated hip abduction green x10, blue x10   11/02/22 Nustep L4 x6 minutes BLEs only  Bridges with red TB x12  Supine clams red TB x12 Figure four stretch 3x30 seconds B L hip flexor lifts off edge of table 2x5  In // bars: Tandem stance 3x30 seconds B  Narrow BOS blue foam pad 3x30 seconds    EVAL 10/31/22    PATIENT EDUCATION:  Education details: POC and HEP Person educated: Patient Education method: Explanation Education comprehension: verbalized understanding  HOME EXERCISE PROGRAM: Access Code: [email or web address] URL: [email or web address] Date: 10/31/2022 Prepared by: Cassie Freer  Exercises - Standing Tandem Balance with Counter Support  - 1 x daily - 7 x weekly - 2 reps - 30 hold - Single Leg Stance with Support  - 1 x daily - 7 x weekly - 2 reps - 10 hold - Sit to Stand  - 1 x daily - 7 x weekly - 2 sets - 10 reps - Standing Marching  - 1 x daily - 7 x weekly - 2 sets - 10 reps  ASSESSMENT:  CLINICAL IMPRESSION: Patient reports that she felt like she went into a-fib during the very first of the treatment, confirmed by her watch, reports no big issue at first, we continued to treat today but really backed off anything that was strenuous.  OBJECTIVE IMPAIRMENTS: Abnormal gait, decreased activity tolerance, decreased balance, decreased coordination, difficulty walking, and decreased strength.   ACTIVITY LIMITATIONS: squatting, stairs, and locomotion level  REHAB POTENTIAL:  Good  CLINICAL DECISION MAKING: Stable/uncomplicated  EVALUATION COMPLEXITY: Low  GOALS:  Goals reviewed with patient? Yes  SHORT TERM GOALS: Target date: 12/12/22  Patient will be independent with initial HEP. Goal status: met 01/15/23  2.  Patient will demonstrate decreased 5xSTS from chair to <15s Baseline: 21.15s  Goal status: progressing 11/17/22, 21s with pain 12/11/22 ongoing    LONG TERM GOALS: Target date: 01/23/23  Patient will be independent with advanced/ongoing HEP to improve outcomes and carryover.  Goal status: progressing 12/28/22  2.  Patient will be able to do SLS for 5-10s.  Baseline: 2s  Goal status: 12/28/22 4 seconds  3.  Patient will be able to navigate stairs with reciprocal alternating pattern.  Baseline: does 1 steps at a time  Goal status: 12/28/22 can go up 4" steps step over step, cannot go down step over step the left knee gives out  4.  Patient will demonstrate improved hip flexor strength to 5/5 Baseline: 3+/5 , 12/28/22 4-/5 bilaterally Goal status: progressing 12/28/22  5.  Patient will be able to do full tandem stance for 15s or better. Baseline: unable to get in position Goal status: can get in 1/2 tandem and hold 10 seconds  6.  Patient will score 53 on Berg Balance test to demonstrate lower risk of falls.  Baseline: 48 Goal status: 50/56 12/28/22 ongoing   PLAN:  PT FREQUENCY: 2x/week  PT DURATION: 12 weeks  PLANNED INTERVENTIONS: 97110-Therapeutic exercises, 97530- Therapeutic activity, 97112- Neuromuscular re-education, 97535- Self Care, 16109- Manual therapy, [phone]- Gait training, Patient/Family education, Balance training, Stair training, Cryotherapy, and Moist heat  PLAN FOR NEXT SESSION: assure she is okay and progress as tolerated for strength, balance and endurance  Stacie Glaze, PT 01/22/23 4:16 PM

## 2023-01-26 ENCOUNTER — Encounter: Payer: Self-pay | Admitting: Internal Medicine

## 2023-01-29 ENCOUNTER — Encounter: Payer: Self-pay | Admitting: Physical Therapy

## 2023-01-29 ENCOUNTER — Ambulatory Visit: Payer: Medicare Other | Admitting: Physical Therapy

## 2023-01-29 DIAGNOSIS — F40298 Other specified phobia: Secondary | ICD-10-CM

## 2023-01-29 DIAGNOSIS — R2689 Other abnormalities of gait and mobility: Secondary | ICD-10-CM

## 2023-01-29 DIAGNOSIS — M6281 Muscle weakness (generalized): Secondary | ICD-10-CM

## 2023-01-29 DIAGNOSIS — H43813 Vitreous degeneration, bilateral: Secondary | ICD-10-CM | POA: Diagnosis not present

## 2023-01-29 MED ORDER — DILTIAZEM HCL 30 MG PO TABS: ORAL_TABLET | ORAL | 1 refills | Status: DC

## 2023-01-29 NOTE — Therapy (Signed)
OUTPATIENT PHYSICAL THERAPY LOWER EXTREMITY TREATMENT     Patient Name: Veronica Bradford MRN: [id number] DOB:[birth year], 78 y.o., female Today's Date: 01/29/2023  END OF SESSION:  PT End of Session - 01/29/23 1614     Visit Number 11    Date for PT Re-Evaluation 03/12/23    Authorization Type [employer] [facility] 4 of 9    PT Start Time 1615    PT Stop Time 1700    PT Time Calculation (min) 45 min    Activity Tolerance Patient tolerated treatment well    Behavior During Therapy WFL for tasks assessed/performed                 Past Medical History:  Diagnosis Date   Atrial fibrillation (HCC)    Atrial fibrillation (HCC)    Back pain    CHF (congestive heart failure) (HCC)    grade 2 HF   Chronic sinusitis    s/p 2 surgeries remotely, Dr Ezzard Standing   Closed fracture of metatarsal of left foot    L foot, fifth metatarsal   Complication of anesthesia    woke up once during procedure   Dysrhythmia    a-fib   Ectopic pregnancy    Endometrial cancer (HCC) 2004   Early diagnosis   GERD (gastroesophageal reflux disease)    Hyperlipemia    Hypertension    IBS (irritable bowel syndrome) 08/13/2014   Dx 2015, diarrhea on-off, Dr Matthias Hughs    Lumbar radiculopathy 08/2012   MRI in 08/2012   Mild hyperparathyroidism (HCC)    Osteopenia    PAF (paroxysmal atrial fibrillation) (HCC)    Presence of permanent cardiac pacemaker    Sciatica of right side 08/2013   Received Depomedrol 80 mg injection   Sleep apnea 09/09/2013   Dx with OSA in 2015, by Dr. Graciela Husbands,   wears CPAPAPAP   Past Surgical History:  Procedure Laterality Date   ATRIAL FIBRILLATION ABLATION N/A 09/04/2017   Procedure: ATRIAL FIBRILLATION ABLATION;  Surgeon: Hillis Range, MD;  [facility];  Service: Cardiovascular;  Laterality: N/A;   BIOPSY  03/11/2020   Procedure: BIOPSY;  Surgeon: Bernette Redbird, MD;  Location: [facility];  Service: Endoscopy;;  EGD and COLON   CARPAL TUNNEL RELEASE Right 06/17/2019    CARPAL TUNNEL RELEASE Left 11/2020   CHOLECYSTECTOMY  ~2006   COLONOSCOPY WITH PROPOFOL N/A 03/11/2020   Procedure: COLONOSCOPY WITH PROPOFOL;  Surgeon: Bernette Redbird, MD;  Location: [facility];  Service: Endoscopy;  Laterality: N/A;   DILATION AND CURETTAGE OF UTERUS     after SAB   ESOPHAGOGASTRODUODENOSCOPY  02/09/2014   Dr. Matthias Hughs   ESOPHAGOGASTRODUODENOSCOPY (EGD) WITH PROPOFOL N/A 03/11/2020   Procedure: ESOPHAGOGASTRODUODENOSCOPY (EGD) WITH PROPOFOL;  Surgeon: Bernette Redbird, MD;  Location: [facility];  Service: Endoscopy;  Laterality: N/A;   FINGER SURGERY Left    index   NASAL SINUS SURGERY     x 2 remotely, Dr Ezzard Standing   PACEMAKER IMPLANT N/A 09/13/2020   Procedure: PACEMAKER IMPLANT;  Surgeon: Duke Salvia, MD;  Location: [facility];  Service: Cardiovascular;  Laterality: N/A;   PARATHYROIDECTOMY Left 09/14/2022   Procedure: LEFT PARATHYROIDECTOMY;  Surgeon: Darnell Level, MD;  Location: [facility];  Service: General;  Laterality: Left;   TONSILLECTOMY     TOTAL ABDOMINAL HYSTERECTOMY W/ BILATERAL SALPINGOOPHORECTOMY     TOTAL HIP ARTHROPLASTY Left 07/18/2021   Procedure: TOTAL HIP ARTHROPLASTY ANTERIOR APPROACH;  Surgeon: Samson Frederic, MD;  Location: [facility];  Service: Orthopedics;  Laterality: Left;   TOTAL KNEE ARTHROPLASTY Right ~2008   Patient Active Problem List   Diagnosis Date Noted   Class 2 obesity 11/29/2022   Cellulitis of left lower extremity 11/29/2022   Hyperparathyroidism, primary (HCC) 09/03/2022   DOE (dyspnea on exertion) 05/04/2022   Paroxysmal A-fib (HCC) 01/03/2022   Atrial fibrillation and flutter (HCC) - Persistent 08/16/2021   Closed left hip fracture, initial encounter (HCC) 07/14/2021   Chronic diastolic CHF (congestive heart failure) (HCC) 07/14/2021   Pacemaker 12/14/2020   RBBB 08/09/2020   PVC's (premature ventricular contractions) 08/09/2020   Sinus bradycardia - junctional rhythm 08/09/2020   Carpal tunnel syndrome,  right 07/08/2019   Vitamin D deficiency 01/20/2019   Macular pucker, right eye 11/18/2018   Multiple pulmonary nodules determined by computed tomography of lung 12/06/2017   Paroxysmal atrial fibrillation (HCC) 09/04/2017   Osteopenia 05/23/2017   Annual physical exam 04/14/2015   PCP NOTES >>>>> 12/23/2014   IBS (irritable bowel syndrome) 08/13/2014   Hyperglycemia    Chronic sinusitis    Endometrial cancer (HCC)    Back pain-- on tylenol #3 prn    GERD (gastroesophageal reflux disease)    HTN (hypertension) 09/09/2013   Diastolic dysfunction-grade 2 09/09/2013   Chronic anticoagulation 09/09/2013   Edema of both legs 09/09/2013   Dyslipidemia 09/09/2013   Obesity (BMI 30-39.9) 09/09/2013   OSA on CPAP 09/09/2013    PCP: Willow Ora  REFERRING PROVIDER: Trudee Grip  REFERRING DIAG: Gait disturbances and LE weakness  THERAPY DIAG:  Other abnormalities of gait and mobility  Muscle weakness (generalized)  Fear of falling  Balance disorder  Rationale for Evaluation and Treatment: Rehabilitation  ONSET DATE: 10/06/22  SUBJECTIVE:   SUBJECTIVE STATEMENT: Patient reports that it took about 15 hours before she was out of A-fib PERTINENT HISTORY: THA- 07/18/21 TKA 2008 Pacemaker 2022 Trochanteric bursitis OA L knee  A fib CHF   PAIN:  Are you having pain? No 0/10  PRECAUTIONS: None  RED FLAGS: None   WEIGHT BEARING RESTRICTIONS: No  FALLS:  Has patient fallen in last 6 months? No  LIVING ENVIRONMENT: Lives with: lives with their family Lives in: House/apartment Stairs: Yes: Internal: 14 steps; can reach both   OCCUPATION: Retired Engineer, civil (consulting)   PLOF: Independent  PATIENT GOALS: to feel more stable    OBJECTIVE:  Note: Objective measures were completed at Evaluation unless otherwise noted.  DIAGNOSTIC FINDINGS: N/A    COGNITION: Overall cognitive status: Within functional limits for tasks assessed     SENSATION: WFL   MUSCLE  LENGTH: Hamstrings: tightness BLE  POSTURE: rounded shoulders and forward head   LOWER EXTREMITY ROM: some hip flexor limitation due to weakness   LOWER EXTREMITY MMT: hip flexor 3+/5 and 4/5 generally    FUNCTIONAL TESTS:  5 times sit to stand: 21.15s from chair  Timed up and go (TUG): 10.19s Berg Balance Scale: 48/56    TODAY'S TREATMENT:  DATE:  01/29/23 Gait outside around the parking [place] 2 rest breaks 40# resisted gait all directions 20# leg curls On airex ball toss and volleyball Airex balance beam side stepping and tandem walking in pbars On rockerboard two ways reaching for numbers Walking ball toss 20# leg press , tried single legs but left knee pain, did SL without weight Direction changes  01/22/23 Bike level 3 x 3 minutes Side stepping on airex balance beam Feet on ball K2C, rotation, bridge and isometric abs Green tband clamshells Ball b/n knees squeeze Black tband supine hip extension On airex ball toss, volley ball and head turns  01/15/23 Nustep Level 5 x 6 minutes Passive stretch LE's Leg curls 20# 2x10 Calf stretches Tmill pushes 15 seconds x 3 On airex 5# straight arm pulls On Bosu balance and reaching  On airex reaching for numbers Step turn reach Resisted gait forward 1 lap Ball kicks On airex ball toss and volleyball Cone toe touches  01/10/23 Feet on ball K2C, rotation, small bridge, isometric abs LE stretches Nustep level 5 x 6 minutes Step turn touch On airex touching numbers called out on wall Resisted gait 40 # all directions On airex ball toss and volleyball Side step on and off airex  12/28/22 Retested goals see below Did berg Nustep level 4 x 6 minutes Calf stretches Step turn reach On airex ball toss Side step on and off airex Airex balance beam tandem walk  12/20/22: B leg press 20# 2 sets  20 B ankle plantarflexion 20# 1 sets 15 Nustep level 5, 6 min Side stepping on airex balance pad in ll bars Side stepping with green theraband around thighs, 6 laps on ll bars  12/11/22 NuStep L3x56mins  Calf stretch 15s x2  Leg press 20# 2x10 Walking on beam Side steps on to airex    11/17/22 Feet on ball K2C, rotation, bridge and iso abs Green tband clam shells Ball b/n knees squeeze On airex balance beam side stepping On airex cone toe touches On airex ball toss Nustep level 4 x 6 minutes Resisted gait 40# fwd and backward 20# leg curls 2x10  11/10/22 NuStep L5x88mins LE only  Leg ext 10# 2x10 HS curls 20# 2x10 STS x10, x10 on airex  Lateral band steps green x20 steps Seated hip abduction green x10, blue x10   11/02/22 Nustep L4 x6 minutes BLEs only  Bridges with red TB x12  Supine clams red TB x12 Figure four stretch 3x30 seconds B L hip flexor lifts off edge of table 2x5  In // bars: Tandem stance 3x30 seconds B  Narrow BOS blue foam pad 3x30 seconds    EVAL 10/31/22    PATIENT EDUCATION:  Education details: POC and HEP Person educated: Patient Education method: Explanation Education comprehension: verbalized understanding  HOME EXERCISE PROGRAM: Access Code: [email or web address] URL: [email or web address] Date: 10/31/2022 Prepared by: Cassie Freer  Exercises - Standing Tandem Balance with Counter Support  - 1 x daily - 7 x weekly - 2 reps - 30 hold - Single Leg Stance with Support  - 1 x daily - 7 x weekly - 2 reps - 10 hold - Sit to Stand  - 1 x daily - 7 x weekly - 2 sets - 10 reps - Standing Marching  - 1 x daily - 7 x weekly - 2 sets - 10 reps  ASSESSMENT:  CLINICAL IMPRESSION: Patient fatigues quickly, really struggled with the walk around the parking [place].  She required two rests and was short  of breath, she also was unsteady on the uneven terrain and reports that she really avoids grass.  The left knee does hurt and give her some  problems had to limit the leg press and could not do leg extension  OBJECTIVE IMPAIRMENTS: Abnormal gait, decreased activity tolerance, decreased balance, decreased coordination, difficulty walking, and decreased strength.   ACTIVITY LIMITATIONS: squatting, stairs, and locomotion level  REHAB POTENTIAL: Good  CLINICAL DECISION MAKING: Stable/uncomplicated  EVALUATION COMPLEXITY: Low  GOALS: Goals reviewed with patient? Yes  SHORT TERM GOALS: Target date: 12/12/22  Patient will be independent with initial HEP. Goal status: met 01/15/23  2.  Patient will demonstrate decreased 5xSTS from chair to <15s Baseline: 21.15s  Goal status: progressing 11/17/22, 21s with pain 12/11/22 ongoing    LONG TERM GOALS: Target date: 01/23/23  Patient will be independent with advanced/ongoing HEP to improve outcomes and carryover.  Goal status: progressing 12/28/22  2.  Patient will be able to do SLS for 5-10s.  Baseline: 2s  Goal status: 12/28/22 4 seconds  3.  Patient will be able to navigate stairs with reciprocal alternating pattern.  Baseline: does 1 steps at a time  Goal status: 12/28/22 can go up 4" steps step over step, cannot go down step over step the left knee gives out  4.  Patient will demonstrate improved hip flexor strength to 5/5 Baseline: 3+/5 , 12/28/22 4-/5 bilaterally Goal status: progressing 12/28/22  5.  Patient will be able to do full tandem stance for 15s or better. Baseline: unable to get in position Goal status: can get in 1/2 tandem and hold 10 seconds  6.  Patient will score 53 on Berg Balance test to demonstrate lower risk of falls.  Baseline: 48 Goal status: 50/56 12/28/22 ongoing   PLAN:  PT FREQUENCY: 2x/week  PT DURATION: 12 weeks  PLANNED INTERVENTIONS: 97110-Therapeutic exercises, 97530- Therapeutic activity, 97112- Neuromuscular re-education, 97535- Self Care, [id number]- Manual therapy, [phone]- Gait training, Patient/Family education, Balance training,  Stair training, Cryotherapy, and Moist heat  PLAN FOR NEXT SESSION: assure she is okay and progress as tolerated for strength, balance and endurance  Stacie Glaze, PT 01/29/23 4:15 PM

## 2023-02-05 ENCOUNTER — Ambulatory Visit: Payer: Medicare Other | Attending: Orthopedic Surgery | Admitting: Physical Therapy

## 2023-02-05 ENCOUNTER — Encounter: Payer: Self-pay | Admitting: Physical Therapy

## 2023-02-05 DIAGNOSIS — R2689 Other abnormalities of gait and mobility: Secondary | ICD-10-CM | POA: Diagnosis not present

## 2023-02-05 DIAGNOSIS — M6281 Muscle weakness (generalized): Secondary | ICD-10-CM | POA: Insufficient documentation

## 2023-02-05 DIAGNOSIS — F40298 Other specified phobia: Secondary | ICD-10-CM | POA: Insufficient documentation

## 2023-02-05 NOTE — Therapy (Signed)
OUTPATIENT PHYSICAL THERAPY LOWER EXTREMITY TREATMENT     Patient Name: Veronica Bradford MRN: [id number] DOB:07-29-1945, 78 y.o., female Today's Date: 02/05/2023  END OF SESSION:  PT End of Session - 02/05/23 1456     Visit Number 12    Authorization Type [facility] [facility] 5 of 9    PT Start Time 1445    PT Stop Time 1530    PT Time Calculation (min) 45 min    Activity Tolerance Patient tolerated treatment well    Behavior During Therapy WFL for tasks assessed/performed                 Past Medical History:  Diagnosis Date   Atrial fibrillation (HCC)    Atrial fibrillation (HCC)    Back pain    CHF (congestive heart failure) (HCC)    grade 2 HF   Chronic sinusitis    s/p 2 surgeries remotely, Dr Ezzard Standing   Closed fracture of metatarsal of left foot    L foot, fifth metatarsal   Complication of anesthesia    woke up once during procedure   Dysrhythmia    a-fib   Ectopic pregnancy    Endometrial cancer (HCC) 2004   Early diagnosis   GERD (gastroesophageal reflux disease)    Hyperlipemia    Hypertension    IBS (irritable bowel syndrome) 08/13/2014   Dx 2015, diarrhea on-off, Dr Matthias Hughs    Lumbar radiculopathy 08/2012   MRI in 08/2012   Mild hyperparathyroidism (HCC)    Osteopenia    PAF (paroxysmal atrial fibrillation) (HCC)    Presence of permanent cardiac pacemaker    Sciatica of right side 08/2013   Received Depomedrol 80 mg injection   Sleep apnea 09/09/2013   Dx with OSA in 2015, by Dr. Graciela Husbands,   wears CPAPAPAP   Past Surgical History:  Procedure Laterality Date   ATRIAL FIBRILLATION ABLATION N/A 09/04/2017   Procedure: ATRIAL FIBRILLATION ABLATION;  Surgeon: Hillis Range, MD;  [facility]: [facility];  Service: Cardiovascular;  Laterality: N/A;   BIOPSY  03/11/2020   Procedure: BIOPSY;  Surgeon: Bernette Redbird, MD;  Location: [facility];  Service: Endoscopy;;  EGD and COLON   CARPAL TUNNEL RELEASE Right 06/17/2019   CARPAL TUNNEL RELEASE Left 11/2020    CHOLECYSTECTOMY  ~2006   COLONOSCOPY WITH PROPOFOL N/A 03/11/2020   Procedure: COLONOSCOPY WITH PROPOFOL;  Surgeon: Bernette Redbird, MD;  Location: [facility];  Service: Endoscopy;  Laterality: N/A;   DILATION AND CURETTAGE OF UTERUS     after SAB   ESOPHAGOGASTRODUODENOSCOPY  02/09/2014   Dr. Matthias Hughs   ESOPHAGOGASTRODUODENOSCOPY (EGD) WITH PROPOFOL N/A 03/11/2020   Procedure: ESOPHAGOGASTRODUODENOSCOPY (EGD) WITH PROPOFOL;  Surgeon: Bernette Redbird, MD;  Location: [facility];  Service: Endoscopy;  Laterality: N/A;   FINGER SURGERY Left    index   NASAL SINUS SURGERY     x 2 remotely, Dr Ezzard Standing   PACEMAKER IMPLANT N/A 09/13/2020   Procedure: PACEMAKER IMPLANT;  Surgeon: Duke Salvia, MD;  Location: [facility];  Service: Cardiovascular;  Laterality: N/A;   PARATHYROIDECTOMY Left 09/14/2022   Procedure: LEFT PARATHYROIDECTOMY;  Surgeon: Darnell Level, MD;  Location: [facility];  Service: General;  Laterality: Left;   TONSILLECTOMY     TOTAL ABDOMINAL HYSTERECTOMY W/ BILATERAL SALPINGOOPHORECTOMY     TOTAL HIP ARTHROPLASTY Left 07/18/2021   Procedure: TOTAL HIP ARTHROPLASTY ANTERIOR APPROACH;  Surgeon: Samson Frederic, MD;  Location: [facility];  Service: Orthopedics;  Laterality: Left;   TOTAL  KNEE ARTHROPLASTY Right ~2008   Patient Active Problem List   Diagnosis Date Noted   Class 2 obesity 11/29/2022   Cellulitis of left lower extremity 11/29/2022   Hyperparathyroidism, primary (HCC) 09/03/2022   DOE (dyspnea on exertion) 05/04/2022   Paroxysmal A-fib (HCC) 01/03/2022   Atrial fibrillation and flutter (HCC) - Persistent 08/16/2021   Closed left hip fracture, initial encounter (HCC) 07/14/2021   Chronic diastolic CHF (congestive heart failure) (HCC) 07/14/2021   Pacemaker 12/14/2020   RBBB 08/09/2020   PVC's (premature ventricular contractions) 08/09/2020   Sinus bradycardia - junctional rhythm 08/09/2020   Carpal tunnel syndrome, right 07/08/2019   Vitamin D deficiency  01/20/2019   Macular pucker, right eye 11/18/2018   Multiple pulmonary nodules determined by computed tomography of lung 12/06/2017   Paroxysmal atrial fibrillation (HCC) 09/04/2017   Osteopenia 05/23/2017   Annual physical exam 04/14/2015   PCP NOTES >>>>> 12/23/2014   IBS (irritable bowel syndrome) 08/13/2014   Hyperglycemia    Chronic sinusitis    Endometrial cancer (HCC)    Back pain-- on tylenol #3 prn    GERD (gastroesophageal reflux disease)    HTN (hypertension) 09/09/2013   Diastolic dysfunction-grade 2 09/09/2013   Chronic anticoagulation 09/09/2013   Edema of both legs 09/09/2013   Dyslipidemia 09/09/2013   Obesity (BMI 30-39.9) 09/09/2013   OSA on CPAP 09/09/2013    PCP: Willow Ora  REFERRING PROVIDER: Trudee Grip  REFERRING DIAG: Gait disturbances and LE weakness  THERAPY DIAG:  Other abnormalities of gait and mobility  Muscle weakness (generalized)  Fear of falling  Balance disorder  Rationale for Evaluation and Treatment: Rehabilitation  ONSET DATE: 10/06/22  SUBJECTIVE:   SUBJECTIVE STATEMENT: Patient reports no real issues lately except just unsteady on her feet PERTINENT HISTORY: THA- 07/18/21 TKA 2008 Pacemaker 2022 Trochanteric bursitis OA L knee  A fib CHF   PAIN:  Are you having pain? No 0/10  PRECAUTIONS: None  RED FLAGS: None   WEIGHT BEARING RESTRICTIONS: No  FALLS:  Has patient fallen in last 6 months? No  LIVING ENVIRONMENT: Lives with: lives with their family Lives in: House/apartment Stairs: Yes: Internal: 14 steps; can reach both   OCCUPATION: Retired Engineer, civil (consulting)   PLOF: Independent  PATIENT GOALS: to feel more stable    OBJECTIVE:  Note: Objective measures were completed at Evaluation unless otherwise noted.  DIAGNOSTIC FINDINGS: N/A    COGNITION: Overall cognitive status: Within functional limits for tasks assessed     SENSATION: WFL   MUSCLE LENGTH: Hamstrings: tightness BLE  POSTURE: rounded  shoulders and forward head   LOWER EXTREMITY ROM: some hip flexor limitation due to weakness   LOWER EXTREMITY MMT: hip flexor 3+/5 and 4/5 generally    FUNCTIONAL TESTS:  5 times sit to stand: 21.15s from chair  Timed up and go (TUG): 10.19s Berg Balance Scale: 48/56    TODAY'S TREATMENT:  DATE:  02/05/23 Gait outside around the parking [place] CGA on curbs, one rest break On rocker board 2 ways reaching for numbers on wall On airex volleyball Ball kicks Leg curls 25# 2x10, left only 2x15 15# On airex 10# straight arm pulls and then alternating 5# hip extension 2x10 5# hip abduction 2x 5 Gait outside again in the grass, uneven terrain, slower and light CGA at times  01/29/23 Gait outside around the parking [place] 2 rest breaks 40# resisted gait all directions 20# leg curls On airex ball toss and volleyball Airex balance beam side stepping and tandem walking in pbars On rockerboard two ways reaching for numbers Walking ball toss 20# leg press , tried single legs but left knee pain, did SL without weight Direction changes  01/22/23 Bike level 3 x 3 minutes Side stepping on airex balance beam Feet on ball K2C, rotation, bridge and isometric abs Green tband clamshells Ball b/n knees squeeze Black tband supine hip extension On airex ball toss, volley ball and head turns  01/15/23 Nustep Level 5 x 6 minutes Passive stretch LE's Leg curls 20# 2x10 Calf stretches Tmill pushes 15 seconds x 3 On airex 5# straight arm pulls On Bosu balance and reaching  On airex reaching for numbers Step turn reach Resisted gait forward 1 lap Ball kicks On airex ball toss and volleyball Cone toe touches  01/10/23 Feet on ball K2C, rotation, small bridge, isometric abs LE stretches Nustep level 5 x 6 minutes Step turn touch On airex touching numbers called out  on wall Resisted gait 40 # all directions On airex ball toss and volleyball Side step on and off airex  12/28/22 Retested goals see below Did berg Nustep level 4 x 6 minutes Calf stretches Step turn reach On airex ball toss Side step on and off airex Airex balance beam tandem walk  12/20/22: B leg press 20# 2 sets 20 B ankle plantarflexion 20# 1 sets 15 Nustep level 5, 6 min Side stepping on airex balance pad in ll bars Side stepping with green theraband around thighs, 6 laps on ll bars  PATIENT EDUCATION:  Education details: POC and HEP Person educated: Patient Education method: Explanation Education comprehension: verbalized understanding  HOME EXERCISE PROGRAM: Access Code: [email or web address] URL: [email or web address] Date: 10/31/2022 Prepared by: Cassie Freer  Exercises - Standing Tandem Balance with Counter Support  - 1 x daily - 7 x weekly - 2 reps - 30 hold - Single Leg Stance with Support  - 1 x daily - 7 x weekly - 2 reps - 10 hold - Sit to Stand  - 1 x daily - 7 x weekly - 2 sets - 10 reps - Standing Marching  - 1 x daily - 7 x weekly - 2 sets - 10 reps  ASSESSMENT:  CLINICAL IMPRESSION: Patient we increased the walking today, again SOB but able to take only one rest break, on uneven terrain she really is unaure of herself and looks to hold onto things, struggled a little with the hip exercises  OBJECTIVE IMPAIRMENTS: Abnormal gait, decreased activity tolerance, decreased balance, decreased coordination, difficulty walking, and decreased strength.   ACTIVITY LIMITATIONS: squatting, stairs, and locomotion level  REHAB POTENTIAL: Good  CLINICAL DECISION MAKING: Stable/uncomplicated  EVALUATION COMPLEXITY: Low  GOALS: Goals reviewed with patient? Yes  SHORT TERM GOALS: Target date: 12/12/22  Patient will be independent with initial HEP. Goal status: met 01/15/23  2.  Patient will demonstrate decreased 5xSTS from chair to <15s Baseline: 21.15s  Goal status: progressing 11/17/22, 21s with pain 12/11/22 ongoing    LONG TERM GOALS: Target date: 01/23/23  Patient will be independent with advanced/ongoing HEP to improve outcomes and carryover.  Goal status: progressing 12/28/22  2.  Patient will be able to do SLS for 5-10s.  Baseline: 2s  Goal status: 12/28/22 4 seconds  3.  Patient will be able to navigate stairs with reciprocal alternating pattern.  Baseline: does 1 steps at a time  Goal status: 12/28/22 can go up 4" steps step over step, cannot go down step over step the left knee gives out  4.  Patient will demonstrate improved hip flexor strength to 5/5 Baseline: 3+/5 , 12/28/22 4-/5 bilaterally Goal status: progressing 02/05/23  5.  Patient will be able to do full tandem stance for 15s or better. Baseline: unable to get in position Goal status: can get in 1/2 tandem and hold 10 seconds  6.  Patient will score 53 on Berg Balance test to demonstrate lower risk of falls.  Baseline: 48 Goal status: 50/56 02/05/23 ongoing   PLAN:  PT FREQUENCY: 2x/week  PT DURATION: 12 weeks  PLANNED INTERVENTIONS: 97110-Therapeutic exercises, 97530- Therapeutic activity, 97112- Neuromuscular re-education, 97535- Self Care, [id number]- Manual therapy, [phone]- Gait training, Patient/Family education, Balance training, Stair training, Cryotherapy, and Moist heat  PLAN FOR NEXT SESSION: assure she is okay and progress as tolerated for strength, balance and endurance  Stacie Glaze, PT 02/05/23 2:56 PM

## 2023-02-12 ENCOUNTER — Encounter: Payer: Self-pay | Admitting: Physical Therapy

## 2023-02-12 ENCOUNTER — Ambulatory Visit: Payer: Medicare Other | Admitting: Physical Therapy

## 2023-02-12 DIAGNOSIS — M6281 Muscle weakness (generalized): Secondary | ICD-10-CM

## 2023-02-12 DIAGNOSIS — R2689 Other abnormalities of gait and mobility: Secondary | ICD-10-CM

## 2023-02-12 DIAGNOSIS — F40298 Other specified phobia: Secondary | ICD-10-CM

## 2023-02-12 NOTE — Therapy (Signed)
 OUTPATIENT PHYSICAL THERAPY LOWER EXTREMITY TREATMENT     Patient Name: Veronica Bradford MRN: [id number] DOB:Sep 20, 1945, 78 y.o., female Today's Date: 02/12/2023  END OF SESSION:  PT End of Session - 02/12/23 1450     Visit Number 13    Date for PT Re-Evaluation 03/12/23    Authorization Type [employer] [facility] 6 of 9    PT Start Time 1445    PT Stop Time 1530    PT Time Calculation (min) 45 min    Activity Tolerance Patient tolerated treatment well    Behavior During Therapy WFL for tasks assessed/performed                 Past Medical History:  Diagnosis Date   Atrial fibrillation (HCC)    Atrial fibrillation (HCC)    Back pain    CHF (congestive heart failure) (HCC)    grade 2 HF   Chronic sinusitis    s/p 2 surgeries remotely, Dr Odean Bend   Closed fracture of metatarsal of left foot    L foot, fifth metatarsal   Complication of anesthesia    woke up once during procedure   Dysrhythmia    a-fib   Ectopic pregnancy    Endometrial cancer (HCC) 2004   Early diagnosis   GERD (gastroesophageal reflux disease)    Hyperlipemia    Hypertension    IBS (irritable bowel syndrome) 08/13/2014   Dx 2015, diarrhea on-off, Dr Dellis Fermo    Lumbar radiculopathy 08/2012   MRI in 08/2012   Mild hyperparathyroidism (HCC)    Osteopenia    PAF (paroxysmal atrial fibrillation) (HCC)    Presence of permanent cardiac pacemaker    Sciatica of right side 08/2013   Received Depomedrol 80 mg injection   Sleep apnea 09/09/2013   Dx with OSA in 2015, by Dr. Rodolfo Clan,   wears CPAPAPAP   Past Surgical History:  Procedure Laterality Date   ATRIAL FIBRILLATION ABLATION N/A 09/04/2017   Procedure: ATRIAL FIBRILLATION ABLATION;  Surgeon: Jolly Needle, MD;  [facility]: [facility];  Service: Cardiovascular;  Laterality: N/A;   BIOPSY  03/11/2020   Procedure: BIOPSY;  Surgeon: Lanita Pitman, MD;  Location: [facility];  Service: Endoscopy;;  EGD and COLON   CARPAL TUNNEL RELEASE Right 06/17/2019    CARPAL TUNNEL RELEASE Left 11/2020   CHOLECYSTECTOMY  ~2006   COLONOSCOPY WITH PROPOFOL  N/A 03/11/2020   Procedure: COLONOSCOPY WITH PROPOFOL ;  Surgeon: Lanita Pitman, MD;  Location: [facility];  Service: Endoscopy;  Laterality: N/A;   DILATION AND CURETTAGE OF UTERUS     after SAB   ESOPHAGOGASTRODUODENOSCOPY  02/09/2014   Dr. Dellis Fermo   ESOPHAGOGASTRODUODENOSCOPY (EGD) WITH PROPOFOL  N/A 03/11/2020   Procedure: ESOPHAGOGASTRODUODENOSCOPY (EGD) WITH PROPOFOL ;  Surgeon: Lanita Pitman, MD;  Location: [facility];  Service: Endoscopy;  Laterality: N/A;   FINGER SURGERY Left    index   NASAL SINUS SURGERY     x 2 remotely, Dr Odean Bend   PACEMAKER IMPLANT N/A 09/13/2020   Procedure: PACEMAKER IMPLANT;  Surgeon: Verona Goodwill, MD;  Location: [facility];  Service: Cardiovascular;  Laterality: N/A;   PARATHYROIDECTOMY Left 09/14/2022   Procedure: LEFT PARATHYROIDECTOMY;  Surgeon: Oralee Billow, MD;  Location: [facility];  Service: General;  Laterality: Left;   TONSILLECTOMY     TOTAL ABDOMINAL HYSTERECTOMY W/ BILATERAL SALPINGOOPHORECTOMY     TOTAL HIP ARTHROPLASTY Left 07/18/2021   Procedure: TOTAL HIP ARTHROPLASTY ANTERIOR APPROACH;  Surgeon: Adonica Hoose, MD;  Location: [facility];  Service: Orthopedics;  Laterality: Left;   TOTAL KNEE ARTHROPLASTY Right ~2008   Patient Active Problem List   Diagnosis Date Noted   Class 2 obesity 11/29/2022   Cellulitis of left lower extremity 11/29/2022   Hyperparathyroidism, primary (HCC) 09/03/2022   DOE (dyspnea on exertion) 05/04/2022   Paroxysmal A-fib (HCC) 01/03/2022   Atrial fibrillation and flutter (HCC) - Persistent 08/16/2021   Closed left hip fracture, initial encounter (HCC) 07/14/2021   Chronic diastolic CHF (congestive heart failure) (HCC) 07/14/2021   Pacemaker 12/14/2020   RBBB 08/09/2020   PVC's (premature ventricular contractions) 08/09/2020   Sinus bradycardia - junctional rhythm 08/09/2020   Carpal tunnel syndrome,  right 07/08/2019   Vitamin D  deficiency 01/20/2019   Macular pucker, right eye 11/18/2018   Multiple pulmonary nodules determined by computed tomography of lung 12/06/2017   Paroxysmal atrial fibrillation (HCC) 09/04/2017   Osteopenia 05/23/2017   Annual physical exam 04/14/2015   PCP NOTES >>>>> 12/23/2014   IBS (irritable bowel syndrome) 08/13/2014   Hyperglycemia    Chronic sinusitis    Endometrial cancer (HCC)    Back pain-- on tylenol  #3 prn    GERD (gastroesophageal reflux disease)    HTN (hypertension) 09/09/2013   Diastolic dysfunction-grade 2 09/09/2013   Chronic anticoagulation 09/09/2013   Edema of both legs 09/09/2013   Dyslipidemia 09/09/2013   Obesity (BMI 30-39.9) 09/09/2013   OSA on CPAP 09/09/2013    PCP: Devonna Foley  REFERRING PROVIDER: Athena Lazier  REFERRING DIAG: Gait disturbances and LE weakness  THERAPY DIAG:  Other abnormalities of gait and mobility  Muscle weakness (generalized)  Fear of falling  Balance disorder  Rationale for Evaluation and Treatment: Rehabilitation  ONSET DATE: 10/06/22  SUBJECTIVE:   SUBJECTIVE STATEMENT: No pain, no falls, pretty good weekend PERTINENT HISTORY: THA- 07/18/21 TKA 2008 Pacemaker 2022 Trochanteric bursitis OA L knee  A fib CHF   PAIN:  Are you having pain? No 0/10  PRECAUTIONS: None  RED FLAGS: None   WEIGHT BEARING RESTRICTIONS: No  FALLS:  Has patient fallen in last 6 months? No  LIVING ENVIRONMENT: Lives with: lives with their family Lives in: House/apartment Stairs: Yes: Internal: 14 steps; can reach both   OCCUPATION: Retired Engineer, civil (consulting)   PLOF: Independent  PATIENT GOALS: to feel more stable    OBJECTIVE:  Note: Objective measures were completed at Evaluation unless otherwise noted.  DIAGNOSTIC FINDINGS: N/A    COGNITION: Overall cognitive status: Within functional limits for tasks assessed     SENSATION: WFL   MUSCLE LENGTH: Hamstrings: tightness BLE  POSTURE:  rounded shoulders and forward head   LOWER EXTREMITY ROM: some hip flexor limitation due to weakness   LOWER EXTREMITY MMT: hip flexor 3+/5 and 4/5 generally    FUNCTIONAL TESTS:  5 times sit to stand: 21.15s from chair  Timed up and go (TUG): 10.19s Berg Balance Scale: 48/56    TODAY'S TREATMENT:  DATE:  02/12/23 Nustep level 5 x 6 minutes Leg curls 20# LEg extension 5# 10# straight arm pulls in airex On rockerboard 2 ways reaching for numbers on wall Walking ball toss Direction changes Passive LE stretches Feet on ball K2C, trunk rotation, small bridge, isometric abs  02/05/23 Gait outside around the parking [place] CGA on curbs, one rest break On rocker board 2 ways reaching for numbers on wall On airex volleyball Ball kicks Leg curls 25# 2x10, left only 2x15 15# On airex 10# straight arm pulls and then alternating 5# hip extension 2x10 5# hip abduction 2x 5 Gait outside again in the grass, uneven terrain, slower and light CGA at times  01/29/23 Gait outside around the parking [place] 2 rest breaks 40# resisted gait all directions 20# leg curls On airex ball toss and volleyball Airex balance beam side stepping and tandem walking in pbars On rockerboard two ways reaching for numbers Walking ball toss 20# leg press , tried single legs but left knee pain, did SL without weight Direction changes  01/22/23 Bike level 3 x 3 minutes Side stepping on airex balance beam Feet on ball K2C, rotation, bridge and isometric abs Green tband clamshells Ball b/n knees squeeze Black tband supine hip extension On airex ball toss, volley ball and head turns  01/15/23 Nustep Level 5 x 6 minutes Passive stretch LE's Leg curls 20# 2x10 Calf stretches Tmill pushes 15 seconds x 3 On airex 5# straight arm pulls On Bosu balance and reaching  On airex reaching  for numbers Step turn reach Resisted gait forward 1 lap Ball kicks On airex ball toss and volleyball Cone toe touches   PATIENT EDUCATION:  Education details: POC and HEP Person educated: Patient Education method: Explanation Education comprehension: verbalized understanding  HOME EXERCISE PROGRAM: Access Code: [email or web address] URL: [email or web address] Date: 10/31/2022 Prepared by: Donavon Fudge  Exercises - Standing Tandem Balance with Counter Support  - 1 x daily - 7 x weekly - 2 reps - 30 hold - Single Leg Stance with Support  - 1 x daily - 7 x weekly - 2 reps - 10 hold - Sit to Stand  - 1 x daily - 7 x weekly - 2 sets - 10 reps - Standing Marching  - 1 x daily - 7 x weekly - 2 sets - 10 reps  ASSESSMENT:  CLINICAL IMPRESSION: Patient doing well reports that she feels she needs to do better with her endurance and strength and balance, reports that she is not as unstable as she was, doing better, but LE's are very tight  OBJECTIVE IMPAIRMENTS: Abnormal gait, decreased activity tolerance, decreased balance, decreased coordination, difficulty walking, and decreased strength.   ACTIVITY LIMITATIONS: squatting, stairs, and locomotion level  REHAB POTENTIAL: Good  CLINICAL DECISION MAKING: Stable/uncomplicated  EVALUATION COMPLEXITY: Low  GOALS: Goals reviewed with patient? Yes  SHORT TERM GOALS: Target date: 12/12/22  Patient will be independent with initial HEP. Goal status: met 01/15/23  2.  Patient will demonstrate decreased 5xSTS from chair to <15s Baseline: 21.15s  Goal status: progressing 11/17/22, 21s with pain 12/11/22 ongoing    LONG TERM GOALS: Target date: 01/23/23  Patient will be independent with advanced/ongoing HEP to improve outcomes and carryover.  Goal status: progressing 12/28/22  2.  Patient will be able to do SLS for 5-10s.  Baseline: 2s  Goal status: 12/28/22 4 seconds  3.  Patient will be able to navigate stairs with reciprocal  alternating pattern.  Baseline: does 1 steps at a  time  Goal status: 12/28/22 can go up 4" steps step over step, cannot go down step over step the left knee gives out  4.  Patient will demonstrate improved hip flexor strength to 5/5 Baseline: 3+/5 , 12/28/22 4-/5 bilaterally Goal status: progressing 02/05/23  5.  Patient will be able to do full tandem stance for 15s or better. Baseline: unable to get in position Goal status: can get in 1/2 tandem and hold 10 seconds  6.  Patient will score 53 on Berg Balance test to demonstrate lower risk of falls.  Baseline: 48 Goal status: 50/56 02/05/23 ongoing   PLAN:  PT FREQUENCY: 2x/week  PT DURATION: 12 weeks  PLANNED INTERVENTIONS: 97110-Therapeutic exercises, 97530- Therapeutic activity, 97112- Neuromuscular re-education, 97535- Self Care, 91478- Manual therapy, [phone]- Gait training, Patient/Family education, Balance training, Stair training, Cryotherapy, and Moist heat  PLAN FOR NEXT SESSION: assure she is okay and progress as tolerated for strength, balance and endurance  Cherylene Corrente, PT 02/12/23 2:51 PM

## 2023-02-19 ENCOUNTER — Ambulatory Visit: Payer: Medicare Other | Admitting: Physical Therapy

## 2023-02-25 ENCOUNTER — Encounter: Payer: Self-pay | Admitting: Internal Medicine

## 2023-02-26 DIAGNOSIS — I8312 Varicose veins of left lower extremity with inflammation: Secondary | ICD-10-CM | POA: Diagnosis not present

## 2023-02-26 DIAGNOSIS — I83892 Varicose veins of left lower extremities with other complications: Secondary | ICD-10-CM | POA: Diagnosis not present

## 2023-02-26 DIAGNOSIS — I890 Lymphedema, not elsewhere classified: Secondary | ICD-10-CM | POA: Diagnosis not present

## 2023-02-26 DIAGNOSIS — I8311 Varicose veins of right lower extremity with inflammation: Secondary | ICD-10-CM | POA: Diagnosis not present

## 2023-02-27 ENCOUNTER — Encounter: Payer: Self-pay | Admitting: Physical Therapy

## 2023-02-27 ENCOUNTER — Ambulatory Visit: Payer: Medicare Other | Admitting: Physical Therapy

## 2023-02-27 DIAGNOSIS — M6281 Muscle weakness (generalized): Secondary | ICD-10-CM

## 2023-02-27 DIAGNOSIS — F40298 Other specified phobia: Secondary | ICD-10-CM

## 2023-02-27 DIAGNOSIS — R2689 Other abnormalities of gait and mobility: Secondary | ICD-10-CM | POA: Diagnosis not present

## 2023-02-27 NOTE — Therapy (Signed)
 OUTPATIENT PHYSICAL THERAPY LOWER EXTREMITY TREATMENT     Patient Name: Veronica Bradford MRN: [id number] DOB:1945-09-20, 78 y.o., female Today's Date: 02/27/2023  END OF SESSION:  PT End of Session - 02/27/23 1446     Visit Number 14    Date for PT Re-Evaluation 03/12/23    Authorization Type [facility] [facility] 7 of 9    PT Start Time 1444    PT Stop Time 1530    PT Time Calculation (min) 46 min    Activity Tolerance Patient tolerated treatment well    Behavior During Therapy WFL for tasks assessed/performed                 Past Medical History:  Diagnosis Date   Atrial fibrillation (HCC)    Atrial fibrillation (HCC)    Back pain    CHF (congestive heart failure) (HCC)    grade 2 HF   Chronic sinusitis    s/p 2 surgeries remotely, Dr Ezzard Standing   Closed fracture of metatarsal of left foot    L foot, fifth metatarsal   Complication of anesthesia    woke up once during procedure   Dysrhythmia    a-fib   Ectopic pregnancy    Endometrial cancer (HCC) 2004   Early diagnosis   GERD (gastroesophageal reflux disease)    Hyperlipemia    Hypertension    IBS (irritable bowel syndrome) 08/13/2014   Dx 2015, diarrhea on-off, Dr Matthias Hughs    Lumbar radiculopathy 08/2012   MRI in 08/2012   Mild hyperparathyroidism (HCC)    Osteopenia    PAF (paroxysmal atrial fibrillation) (HCC)    Presence of permanent cardiac pacemaker    Sciatica of right side 08/2013   Received Depomedrol 80 mg injection   Sleep apnea 09/09/2013   Dx with OSA in 2015, by Dr. Graciela Husbands,   wears CPAPAPAP   Past Surgical History:  Procedure Laterality Date   ATRIAL FIBRILLATION ABLATION N/A 09/04/2017   Procedure: ATRIAL FIBRILLATION ABLATION;  Surgeon: Hillis Range, MD;  [facility]: [facility];  Service: Cardiovascular;  Laterality: N/A;   BIOPSY  03/11/2020   Procedure: BIOPSY;  Surgeon: Bernette Redbird, MD;  Location: [facility];  Service: Endoscopy;;  EGD and COLON   CARPAL TUNNEL RELEASE Right 06/17/2019    CARPAL TUNNEL RELEASE Left 11/2020   CHOLECYSTECTOMY  ~2006   COLONOSCOPY WITH PROPOFOL N/A 03/11/2020   Procedure: COLONOSCOPY WITH PROPOFOL;  Surgeon: Bernette Redbird, MD;  Location: [facility];  Service: Endoscopy;  Laterality: N/A;   DILATION AND CURETTAGE OF UTERUS     after SAB   ESOPHAGOGASTRODUODENOSCOPY  02/09/2014   Dr. Matthias Hughs   ESOPHAGOGASTRODUODENOSCOPY (EGD) WITH PROPOFOL N/A 03/11/2020   Procedure: ESOPHAGOGASTRODUODENOSCOPY (EGD) WITH PROPOFOL;  Surgeon: Bernette Redbird, MD;  Location: [facility];  Service: Endoscopy;  Laterality: N/A;   FINGER SURGERY Left    index   NASAL SINUS SURGERY     x 2 remotely, Dr Ezzard Standing   PACEMAKER IMPLANT N/A 09/13/2020   Procedure: PACEMAKER IMPLANT;  Surgeon: Duke Salvia, MD;  [facility] INVASIVE CV LAB;  Service: Cardiovascular;  Laterality: N/A;   PARATHYROIDECTOMY Left 09/14/2022   Procedure: LEFT PARATHYROIDECTOMY;  Surgeon: Darnell Level, MD;  Location: [facility];  Service: General;  Laterality: Left;   TONSILLECTOMY     TOTAL ABDOMINAL HYSTERECTOMY W/ BILATERAL SALPINGOOPHORECTOMY     TOTAL HIP ARTHROPLASTY Left 07/18/2021   Procedure: TOTAL HIP ARTHROPLASTY ANTERIOR APPROACH;  Surgeon: Samson Frederic, MD;  Location: [facility];  Service: Orthopedics;  Laterality: Left;   TOTAL KNEE ARTHROPLASTY Right ~2008   Patient Active Problem List   Diagnosis Date Noted   Class 2 obesity 11/29/2022   Cellulitis of left lower extremity 11/29/2022   Hyperparathyroidism, primary (HCC) 09/03/2022   DOE (dyspnea on exertion) 05/04/2022   Paroxysmal A-fib (HCC) 01/03/2022   Atrial fibrillation and flutter (HCC) - Persistent 08/16/2021   Closed left hip fracture, initial encounter (HCC) 07/14/2021   Chronic diastolic CHF (congestive heart failure) (HCC) 07/14/2021   Pacemaker 12/14/2020   RBBB 08/09/2020   PVC's (premature ventricular contractions) 08/09/2020   Sinus bradycardia - junctional rhythm 08/09/2020   Carpal tunnel syndrome,  right 07/08/2019   Vitamin D deficiency 01/20/2019   Macular pucker, right eye 11/18/2018   Multiple pulmonary nodules determined by computed tomography of lung 12/06/2017   Paroxysmal atrial fibrillation (HCC) 09/04/2017   Osteopenia 05/23/2017   Annual physical exam 04/14/2015   PCP NOTES >>>>> 12/23/2014   IBS (irritable bowel syndrome) 08/13/2014   Hyperglycemia    Chronic sinusitis    Endometrial cancer (HCC)    Back pain-- on tylenol #3 prn    GERD (gastroesophageal reflux disease)    HTN (hypertension) 09/09/2013   Diastolic dysfunction-grade 2 09/09/2013   Chronic anticoagulation 09/09/2013   Edema of both legs 09/09/2013   Dyslipidemia 09/09/2013   Obesity (BMI 30-39.9) 09/09/2013   OSA on CPAP 09/09/2013    PCP: Willow Ora  REFERRING PROVIDER: Trudee Grip  REFERRING DIAG: Gait disturbances and LE weakness  THERAPY DIAG:  Other abnormalities of gait and mobility  Muscle weakness (generalized)  Fear of falling  Balance disorder  Rationale for Evaluation and Treatment: Rehabilitation  ONSET DATE: 10/06/22  SUBJECTIVE:   SUBJECTIVE STATEMENT:  Doing okay. No falls   PERTINENT HISTORY: THA- 07/18/21 TKA 2008 Pacemaker 2022 Trochanteric bursitis OA L knee  A fib CHF   PAIN:  Are you having pain? No 0/10  PRECAUTIONS: None  RED FLAGS: None   WEIGHT BEARING RESTRICTIONS: No  FALLS:  Has patient fallen in last 6 months? No  LIVING ENVIRONMENT: Lives with: lives with their family Lives in: House/apartment Stairs: Yes: Internal: 14 steps; can reach both   OCCUPATION: Retired Engineer, civil (consulting)   PLOF: Independent  PATIENT GOALS: to feel more stable    OBJECTIVE:  Note: Objective measures were completed at Evaluation unless otherwise noted.  DIAGNOSTIC FINDINGS: N/A    COGNITION: Overall cognitive status: Within functional limits for tasks assessed     SENSATION: WFL   MUSCLE LENGTH: Hamstrings: tightness BLE  POSTURE: rounded  shoulders and forward head   LOWER EXTREMITY ROM: some hip flexor limitation due to weakness   LOWER EXTREMITY MMT: hip flexor 3+/5 and 4/5 generally    FUNCTIONAL TESTS:  5 times sit to stand: 21.15s from chair  Timed up and go (TUG): 10.19s Berg Balance Scale: 48/56    TODAY'S TREATMENT:  DATE:  02/27/23 Nustep level 5 x 6 minutes Gait outside 1/2 parking [employer] curbs Cone toe touches solid surface and on airex On airex number reaching  25# HS curls x10, then left only 20# x10, then 15# x10 Partial sit ups LEg press 20# x10, 30# 2x10 2.5# MArches, hip abduction and extension Resisted gait all directions  02/12/23 Nustep level 5 x 6 minutes Leg curls 20# LEg extension 5# 10# straight arm pulls in airex On rockerboard 2 ways reaching for numbers on wall Walking ball toss Direction changes Passive LE stretches Feet on ball K2C, trunk rotation, small bridge, isometric abs  02/05/23 Gait outside around the parking [place] CGA on curbs, one rest break On rocker board 2 ways reaching for numbers on wall On airex volleyball Ball kicks Leg curls 25# 2x10, left only 2x15 15# On airex 10# straight arm pulls and then alternating 5# hip extension 2x10 5# hip abduction 2x 5 Gait outside again in the grass, uneven terrain, slower and light CGA at times  01/29/23 Gait outside around the parking [place] 2 rest breaks 40# resisted gait all directions 20# leg curls On airex ball toss and volleyball Airex balance beam side stepping and tandem walking in pbars On rockerboard two ways reaching for numbers Walking ball toss 20# leg press , tried single legs but left knee pain, did SL without weight Direction changes  01/22/23 Bike level 3 x 3 minutes Side stepping on airex balance beam Feet on ball K2C, rotation, bridge and isometric abs Green  tband clamshells Ball b/n knees squeeze Black tband supine hip extension On airex ball toss, volley ball and head turns  01/15/23 Nustep Level 5 x 6 minutes Passive stretch LE's Leg curls 20# 2x10 Calf stretches Tmill pushes 15 seconds x 3 On airex 5# straight arm pulls On Bosu balance and reaching  On airex reaching for numbers Step turn reach Resisted gait forward 1 lap Ball kicks On airex ball toss and volleyball Cone toe touches   PATIENT EDUCATION:  Education details: POC and HEP Person educated: Patient Education method: Explanation Education comprehension: verbalized understanding  HOME EXERCISE PROGRAM: Access Code: [email or web address] URL: [email or web address] Date: 10/31/2022 Prepared by: Cassie Freer  Exercises - Standing Tandem Balance with Counter Support  - 1 x daily - 7 x weekly - 2 reps - 30 hold - Single Leg Stance with Support  - 1 x daily - 7 x weekly - 2 reps - 10 hold - Sit to Stand  - 1 x daily - 7 x weekly - 2 sets - 10 reps - Standing Marching  - 1 x daily - 7 x weekly - 2 sets - 10 reps  ASSESSMENT:  CLINICAL IMPRESSION: Patient reports that she feels that her balance is improving, she reports that her legs feel weak.  She did fatigue with the hip exercises, and also struggled some with the resisted gait.  She did seem to have a little more confidence with the balance activities  OBJECTIVE IMPAIRMENTS: Abnormal gait, decreased activity tolerance, decreased balance, decreased coordination, difficulty walking, and decreased strength.   ACTIVITY LIMITATIONS: squatting, stairs, and locomotion level  REHAB POTENTIAL: Good  CLINICAL DECISION MAKING: Stable/uncomplicated  EVALUATION COMPLEXITY: Low  GOALS: Goals reviewed with patient? Yes  SHORT TERM GOALS: Target date: 12/12/22  Patient will be independent with initial HEP. Goal status: met 01/15/23  2.  Patient will demonstrate decreased 5xSTS from chair to <15s Baseline: 21.15s   Goal status: progressing 11/17/22, 21s with pain 12/11/22  ongoing    LONG TERM GOALS: Target date: 01/23/23  Patient will be independent with advanced/ongoing HEP to improve outcomes and carryover.  Goal status: progressing 12/28/22  2.  Patient will be able to do SLS for 5-10s.  Baseline: 2s  Goal status: met 02/27/23  3.  Patient will be able to navigate stairs with reciprocal alternating pattern.  Baseline: does 1 steps at a time  Goal status: 12/28/22 can go up 4" steps step over step, cannot go down step over step the left knee gives out  4.  Patient will demonstrate improved hip flexor strength to 5/5 Baseline: 3+/5 , 12/28/22 4-/5 bilaterally Goal status: progressing 02/27/23  5.  Patient will be able to do full tandem stance for 15s or better. Baseline: unable to get in position Goal status: can get in 1/2 tandem and hold 10 seconds  6.  Patient will score 53 on Berg Balance test to demonstrate lower risk of falls.  Baseline: 48 Goal status: 50/56 02/05/23 ongoing   PLAN:  PT FREQUENCY: 2x/week  PT DURATION: 12 weeks  PLANNED INTERVENTIONS: 97110-Therapeutic exercises, 97530- Therapeutic activity, 97112- Neuromuscular re-education, 97535- Self Care, [id number]- Manual therapy, [phone]- Gait training, Patient/Family education, Balance training, Stair training, Cryotherapy, and Moist heat  PLAN FOR NEXT SESSION: assure she is okay and progress as tolerated for strength, balance and endurance  Stacie Glaze, PT 02/27/23 2:47 PM

## 2023-03-05 ENCOUNTER — Ambulatory Visit: Payer: Medicare Other | Attending: Orthopedic Surgery | Admitting: Physical Therapy

## 2023-03-05 ENCOUNTER — Encounter: Payer: Self-pay | Admitting: Physical Therapy

## 2023-03-05 DIAGNOSIS — F40298 Other specified phobia: Secondary | ICD-10-CM | POA: Insufficient documentation

## 2023-03-05 DIAGNOSIS — M6281 Muscle weakness (generalized): Secondary | ICD-10-CM | POA: Diagnosis not present

## 2023-03-05 DIAGNOSIS — R2689 Other abnormalities of gait and mobility: Secondary | ICD-10-CM | POA: Insufficient documentation

## 2023-03-05 NOTE — Therapy (Signed)
 OUTPATIENT PHYSICAL THERAPY LOWER EXTREMITY TREATMENT     Patient Name: Veronica Bradford MRN: [id number] DOB:1945-01-18, 78 y.o., female Today's Date: 03/05/2023  END OF SESSION:  PT End of Session - 03/05/23 1321     Visit Number 15    Date for PT Re-Evaluation 03/12/23    Authorization Type [employer] [facility] 8 of 9    PT Start Time 1320    PT Stop Time 1400    PT Time Calculation (min) 40 min    Activity Tolerance Patient tolerated treatment well    Behavior During Therapy WFL for tasks assessed/performed                 Past Medical History:  Diagnosis Date   Atrial fibrillation (HCC)    Atrial fibrillation (HCC)    Back pain    CHF (congestive heart failure) (HCC)    grade 2 HF   Chronic sinusitis    s/p 2 surgeries remotely, Dr Ezzard Standing   Closed fracture of metatarsal of left foot    L foot, fifth metatarsal   Complication of anesthesia    woke up once during procedure   Dysrhythmia    a-fib   Ectopic pregnancy    Endometrial cancer (HCC) 2004   Early diagnosis   GERD (gastroesophageal reflux disease)    Hyperlipemia    Hypertension    IBS (irritable bowel syndrome) 08/13/2014   Dx 2015, diarrhea on-off, Dr Matthias Hughs    Lumbar radiculopathy 08/2012   MRI in 08/2012   Mild hyperparathyroidism (HCC)    Osteopenia    PAF (paroxysmal atrial fibrillation) (HCC)    Presence of permanent cardiac pacemaker    Sciatica of right side 08/2013   Received Depomedrol 80 mg injection   Sleep apnea 09/09/2013   Dx with OSA in 2015, by Dr. Graciela Husbands,   wears CPAPAPAP   Past Surgical History:  Procedure Laterality Date   ATRIAL FIBRILLATION ABLATION N/A 09/04/2017   Procedure: ATRIAL FIBRILLATION ABLATION;  Surgeon: Hillis Range, MD;  [facility]: [facility];  Service: Cardiovascular;  Laterality: N/A;   BIOPSY  03/11/2020   Procedure: BIOPSY;  Surgeon: Bernette Redbird, MD;  Location: [facility];  Service: Endoscopy;;  EGD and COLON   CARPAL TUNNEL RELEASE Right 06/17/2019    CARPAL TUNNEL RELEASE Left 11/2020   CHOLECYSTECTOMY  ~2006   COLONOSCOPY WITH PROPOFOL N/A 03/11/2020   Procedure: COLONOSCOPY WITH PROPOFOL;  Surgeon: Bernette Redbird, MD;  Location: [facility];  Service: Endoscopy;  Laterality: N/A;   DILATION AND CURETTAGE OF UTERUS     after SAB   ESOPHAGOGASTRODUODENOSCOPY  02/09/2014   Dr. Matthias Hughs   ESOPHAGOGASTRODUODENOSCOPY (EGD) WITH PROPOFOL N/A 03/11/2020   Procedure: ESOPHAGOGASTRODUODENOSCOPY (EGD) WITH PROPOFOL;  Surgeon: Bernette Redbird, MD;  Location: [facility];  Service: Endoscopy;  Laterality: N/A;   FINGER SURGERY Left    index   NASAL SINUS SURGERY     x 2 remotely, Dr Ezzard Standing   PACEMAKER IMPLANT N/A 09/13/2020   Procedure: PACEMAKER IMPLANT;  Surgeon: Duke Salvia, MD;  Location: [facility] INV[facility] CV LAB;  Service: Cardiovascular;  Laterality: N/A;   PARATHYROIDECTOMY Left 09/14/2022   Procedure: LEFT PARATHYROIDECTOMY;  Surgeon: Darnell Level, MD;  Location: [facility];  Service: General;  Laterality: Left;   TONSILLECTOMY     TOTAL ABDOMINAL HYSTERECTOMY W/ BILATERAL SALPINGOOPHORECTOMY     TOTAL HIP ARTHROPLASTY Left 07/18/2021   Procedure: TOTAL HIP ARTHROPLASTY ANTERIOR APPROACH;  Surgeon: Samson Frederic, MD;  Location: [facility];  Service: Orthopedics;  Laterality: Left;   TOTAL KNEE ARTHROPLASTY Right ~2008   Patient Active Problem List   Diagnosis Date Noted   Class 2 obesity 11/29/2022   Cellulitis of left lower extremity 11/29/2022   Hyperparathyroidism, primary (HCC) 09/03/2022   DOE (dyspnea on exertion) 05/04/2022   Paroxysmal A-fib (HCC) 01/03/2022   Atrial fibrillation and flutter (HCC) - Persistent 08/16/2021   Closed left hip fracture, initial encounter (HCC) 07/14/2021   Chronic diastolic CHF (congestive heart failure) (HCC) 07/14/2021   Pacemaker 12/14/2020   RBBB 08/09/2020   PVC's (premature ventricular contractions) 08/09/2020   Sinus bradycardia - junctional rhythm 08/09/2020   Carpal tunnel syndrome,  right 07/08/2019   Vitamin D deficiency 01/20/2019   Macular pucker, right eye 11/18/2018   Multiple pulmonary nodules determined by computed tomography of lung 12/06/2017   Paroxysmal atrial fibrillation (HCC) 09/04/2017   Osteopenia 05/23/2017   Annual physical exam 04/14/2015   PCP NOTES >>>>> 12/23/2014   IBS (irritable bowel syndrome) 08/13/2014   Hyperglycemia    Chronic sinusitis    Endometrial cancer (HCC)    Back pain-- on tylenol #3 prn    GERD (gastroesophageal reflux disease)    HTN (hypertension) 09/09/2013   Diastolic dysfunction-grade 2 09/09/2013   Chronic anticoagulation 09/09/2013   Edema of both legs 09/09/2013   Dyslipidemia 09/09/2013   Obesity (BMI 30-39.9) 09/09/2013   OSA on CPAP 09/09/2013    PCP: Willow Ora  REFERRING PROVIDER: Trudee Grip  REFERRING DIAG: Gait disturbances and LE weakness  THERAPY DIAG:  Other abnormalities of gait and mobility  Muscle weakness (generalized)  Fear of falling  Balance disorder  Rationale for Evaluation and Treatment: Rehabilitation  ONSET DATE: 10/06/22  SUBJECTIVE:   SUBJECTIVE STATEMENT:  No falls, no pain.  Just very careful   PERTINENT HISTORY: THA- 07/18/21 TKA 2008 Pacemaker 2022 Trochanteric bursitis OA L knee  A fib CHF   PAIN:  Are you having pain? No 0/10  PRECAUTIONS: None  RED FLAGS: None   WEIGHT BEARING RESTRICTIONS: No  FALLS:  Has patient fallen in last 6 months? No  LIVING ENVIRONMENT: Lives with: lives with their family Lives in: House/apartment Stairs: Yes: Internal: 14 steps; can reach both   OCCUPATION: Retired Engineer, civil (consulting)   PLOF: Independent  PATIENT GOALS: to feel more stable    OBJECTIVE:  Note: Objective measures were completed at Evaluation unless otherwise noted.  DIAGNOSTIC FINDINGS: N/A    COGNITION: Overall cognitive status: Within functional limits for tasks assessed     SENSATION: WFL   MUSCLE LENGTH: Hamstrings: tightness BLE  POSTURE:  rounded shoulders and forward head   LOWER EXTREMITY ROM: some hip flexor limitation due to weakness   LOWER EXTREMITY MMT: hip flexor 3+/5 and 4/5 generally    FUNCTIONAL TESTS:  5 times sit to stand: 21.15s from chair  Timed up and go (TUG): 10.19s Berg Balance Scale: 48/56    TODAY'S TREATMENT:  DATE:  03/05/23 Nustep level 5 x 5 minutes Leg curls 25# x10, 35# 2x10 Leg extension 5# 2x10 On rocker board 2 ways reaching for numbers On airex ball toss On airex cone toe touches Direction changes Walking ball toss Ball kicks Gait outside in the grass uneven terrain with CGA  02/27/23 Nustep level 5 x 6 minutes Gait outside 1/2 parking [place] negotiating curbs Cone toe touches solid surface and on airex On airex number reaching  25# HS curls x10, then left only 20# x10, then 15# x10 Partial sit ups LEg press 20# x10, 30# 2x10 2.5# MArches, hip abduction and extension Resisted gait all directions  02/12/23 Nustep level 5 x 6 minutes Leg curls 20# LEg extension 5# 10# straight arm pulls in airex On rockerboard 2 ways reaching for numbers on wall Walking ball toss Direction changes Passive LE stretches Feet on ball K2C, trunk rotation, small bridge, isometric abs  02/05/23 Gait outside around the parking [place] CGA on curbs, one rest break On rocker board 2 ways reaching for numbers on wall On airex volleyball Ball kicks Leg curls 25# 2x10, left only 2x15 15# On airex 10# straight arm pulls and then alternating 5# hip extension 2x10 5# hip abduction 2x 5 Gait outside again in the grass, uneven terrain, slower and light CGA at times  01/29/23 Gait outside around the parking [place] 2 rest breaks 40# resisted gait all directions 20# leg curls On airex ball toss and volleyball Airex balance beam side stepping and tandem walking in pbars On  rockerboard two ways reaching for numbers Walking ball toss 20# leg press , tried single legs but left knee pain, did SL without weight Direction changes  01/22/23 Bike level 3 x 3 minutes Side stepping on airex balance beam Feet on ball K2C, rotation, bridge and isometric abs Green tband clamshells Ball b/n knees squeeze Black tband supine hip extension On airex ball toss, volley ball and head turns  01/15/23 Nustep Level 5 x 6 minutes Passive stretch LE's Leg curls 20# 2x10 Calf stretches Tmill pushes 15 seconds x 3 On airex 5# straight arm pulls On Bosu balance and reaching  On airex reaching for numbers Step turn reach Resisted gait forward 1 lap Ball kicks On airex ball toss and volleyball Cone toe touches   PATIENT EDUCATION:  Education details: POC and HEP Person educated: Patient Education method: Explanation Education comprehension: verbalized understanding  HOME EXERCISE PROGRAM: Access Code: [email or web address] URL: [email or web address] Date: 10/31/2022 Prepared by: Cassie Freer  Exercises - Standing Tandem Balance with Counter Support  - 1 x daily - 7 x weekly - 2 reps - 30 hold - Single Leg Stance with Support  - 1 x daily - 7 x weekly - 2 reps - 10 hold - Sit to Stand  - 1 x daily - 7 x weekly - 2 sets - 10 reps - Standing Marching  - 1 x daily - 7 x weekly - 2 sets - 10 reps  ASSESSMENT:  CLINICAL IMPRESSION: Patient doing well.   She really still struggles on the dynamic surfaces, she is getting moe confidence with her walking.    OBJECTIVE IMPAIRMENTS: Abnormal gait, decreased activity tolerance, decreased balance, decreased coordination, difficulty walking, and decreased strength.   ACTIVITY LIMITATIONS: squatting, stairs, and locomotion level  REHAB POTENTIAL: Good  CLINICAL DECISION MAKING: Stable/uncomplicated  EVALUATION COMPLEXITY: Low  GOALS: Goals reviewed with patient? Yes  SHORT TERM GOALS: Target date: 12/12/22  Patient  will be independent with initial HEP.  Goal status: met 01/15/23  2.  Patient will demonstrate decreased 5xSTS from chair to <15s Baseline: 21.15s  Goal status: progressing 11/17/22, 21s with pain 12/11/22 ongoing    LONG TERM GOALS: Target date: 01/23/23  Patient will be independent with advanced/ongoing HEP to improve outcomes and carryover.  Goal status: progressing 12/28/22  2.  Patient will be able to do SLS for 5-10s.  Baseline: 2s  Goal status: met 02/27/23  3.  Patient will be able to navigate stairs with reciprocal alternating pattern.  Baseline: does 1 steps at a time  Goal status: 12/28/22 can go up 4" steps step over step, cannot go down step over step the left knee gives out  4.  Patient will demonstrate improved hip flexor strength to 5/5 Baseline: 3+/5 , 12/28/22 4-/5 bilaterally Goal status: progressing 02/27/23  5.  Patient will be able to do full tandem stance for 15s or better. Baseline: unable to get in position Goal status: can get in 1/2 tandem and hold 10 seconds  6.  Patient will score 53 on Berg Balance test to demonstrate lower risk of falls.  Baseline: 48 Goal status: 50/56 02/05/23 ongoing   PLAN:  PT FREQUENCY: 2x/week  PT DURATION: 12 weeks  PLANNED INTERVENTIONS: 97110-Therapeutic exercises, 97530- Therapeutic activity, 97112- Neuromuscular re-education, 97535- Self Care, 16109- Manual therapy, [phone]- Gait training, Patient/Family education, Balance training, Stair training, Cryotherapy, and Moist heat  PLAN FOR NEXT SESSION: will assess and see if we should continue  Stacie Glaze, PT 03/05/23 1:23 PM

## 2023-03-07 ENCOUNTER — Encounter: Payer: Self-pay | Admitting: Internal Medicine

## 2023-03-07 ENCOUNTER — Ambulatory Visit: Payer: Medicare Other | Admitting: Internal Medicine

## 2023-03-07 VITALS — BP 112/70 | HR 65 | Temp 97.6°F | Resp 18 | Ht 67.5 in | Wt 249.5 lb

## 2023-03-07 DIAGNOSIS — Z79899 Other long term (current) drug therapy: Secondary | ICD-10-CM | POA: Diagnosis not present

## 2023-03-07 DIAGNOSIS — E559 Vitamin D deficiency, unspecified: Secondary | ICD-10-CM

## 2023-03-07 DIAGNOSIS — E611 Iron deficiency: Secondary | ICD-10-CM

## 2023-03-07 DIAGNOSIS — I10 Essential (primary) hypertension: Secondary | ICD-10-CM

## 2023-03-07 DIAGNOSIS — R399 Unspecified symptoms and signs involving the genitourinary system: Secondary | ICD-10-CM

## 2023-03-07 DIAGNOSIS — M545 Low back pain, unspecified: Secondary | ICD-10-CM

## 2023-03-07 DIAGNOSIS — E210 Primary hyperparathyroidism: Secondary | ICD-10-CM

## 2023-03-07 DIAGNOSIS — D649 Anemia, unspecified: Secondary | ICD-10-CM | POA: Diagnosis not present

## 2023-03-07 DIAGNOSIS — G8929 Other chronic pain: Secondary | ICD-10-CM | POA: Diagnosis not present

## 2023-03-07 DIAGNOSIS — I480 Paroxysmal atrial fibrillation: Secondary | ICD-10-CM

## 2023-03-07 LAB — URINALYSIS, ROUTINE W REFLEX MICROSCOPIC
Bilirubin Urine: NEGATIVE
Hgb urine dipstick: NEGATIVE
Ketones, ur: NEGATIVE
Leukocytes,Ua: NEGATIVE
Nitrite: NEGATIVE
RBC / HPF: NONE SEEN (ref 0–?)
Specific Gravity, Urine: 1.01 (ref 1.000–1.030)
Total Protein, Urine: NEGATIVE
Urine Glucose: NEGATIVE
Urobilinogen, UA: 0.2 (ref 0.0–1.0)
WBC, UA: NONE SEEN (ref 0–?)
pH: 6 (ref 5.0–8.0)

## 2023-03-07 LAB — CBC WITH DIFFERENTIAL/PLATELET
Basophils Absolute: 0 10*3/uL (ref 0.0–0.1)
Basophils Relative: 0.7 % (ref 0.0–3.0)
Eosinophils Absolute: 0.1 10*3/uL (ref 0.0–0.7)
Eosinophils Relative: 2.5 % (ref 0.0–5.0)
HCT: 43.2 % (ref 36.0–46.0)
Hemoglobin: 14 g/dL (ref 12.0–15.0)
Lymphocytes Relative: 20.2 % (ref 12.0–46.0)
Lymphs Abs: 1.1 10*3/uL (ref 0.7–4.0)
MCHC: 32.5 g/dL (ref 30.0–36.0)
MCV: 90.4 fl (ref 78.0–100.0)
Monocytes Absolute: 0.5 10*3/uL (ref 0.1–1.0)
Monocytes Relative: 10.3 % (ref 3.0–12.0)
Neutro Abs: 3.5 10*3/uL (ref 1.4–7.7)
Neutrophils Relative %: 66.3 % (ref 43.0–77.0)
Platelets: 247 10*3/uL (ref 150.0–400.0)
RBC: 4.78 Mil/uL (ref 3.87–5.11)
RDW: 15.2 % (ref 11.5–15.5)
WBC: 5.2 10*3/uL (ref 4.0–10.5)

## 2023-03-07 LAB — IBC + FERRITIN
Ferritin: 21.1 ng/mL (ref 10.0–291.0)
Iron: 42 ug/dL (ref 42–145)
Saturation Ratios: 10.2 % — ABNORMAL LOW (ref 20.0–50.0)
TIBC: 413 ug/dL (ref 250.0–450.0)
Transferrin: 295 mg/dL (ref 212.0–360.0)

## 2023-03-07 LAB — VITAMIN D 25 HYDROXY (VIT D DEFICIENCY, FRACTURES): VITD: 33.98 ng/mL (ref 30.00–100.00)

## 2023-03-07 MED ORDER — ACETAMINOPHEN-CODEINE 300-30 MG PO TABS: 1.0000 | ORAL_TABLET | Freq: Every evening | ORAL | 0 refills | Status: DC | PRN

## 2023-03-07 MED ORDER — FERROUS FUMARATE 325 (106 FE) MG PO TABS: 1.0000 | ORAL_TABLET | Freq: Two times a day (BID) | ORAL | 1 refills | Status: DC

## 2023-03-07 NOTE — Progress Notes (Unsigned)
 Subjective:    Patient ID: Veronica Bradford, female    DOB: 04/03/1945, 78 y.o.   MRN: [id number]  DOS:  03/07/2023 Type of visit - description: Follow-up  Couple issues discussed. At the last visit was seen with cellulitis, much improved. Request Tylenol 3 refill. Request vitamin D check. Patient like to rule out a UTI, have some burning at the suprapubic area with urination but no dysuria per se.  No blood in the urine. Still having diarrhea, working with GI.   Review of Systems See above   Past Medical History:  Diagnosis Date   Atrial fibrillation (HCC)    Atrial fibrillation (HCC)    Back pain    CHF (congestive heart failure) (HCC)    grade 2 HF   Chronic sinusitis    s/p 2 surgeries remotely, Dr Ezzard Standing   Closed fracture of metatarsal of left foot    L foot, fifth metatarsal   Complication of anesthesia    woke up once during procedure   Dysrhythmia    a-fib   Ectopic pregnancy    Endometrial cancer (HCC) 2004   Early diagnosis   GERD (gastroesophageal reflux disease)    Hyperlipemia    Hypertension    IBS (irritable bowel syndrome) 08/13/2014   Dx 2015, diarrhea on-off, Dr Matthias Hughs    Lumbar radiculopathy 08/2012   MRI in 08/2012   Mild hyperparathyroidism (HCC)    Osteopenia    PAF (paroxysmal atrial fibrillation) (HCC)    Presence of permanent cardiac pacemaker    Sciatica of right side 08/2013   Received Depomedrol 80 mg injection   Sleep apnea 09/09/2013   Dx with OSA in 2015, by Dr. Graciela Husbands,   wears CPAPAPAP    Past Surgical History:  Procedure Laterality Date   ATRIAL FIBRILLATION ABLATION N/A 09/04/2017   Procedure: ATRIAL FIBRILLATION ABLATION;  Surgeon: Hillis Range, MD;  [facility]: [facility];  Service: Cardiovascular;  Laterality: N/A;   BIOPSY  03/11/2020   Procedure: BIOPSY;  Surgeon: Bernette Redbird, MD;  Location: [facility];  Service: Endoscopy;;  EGD and COLON   CARPAL TUNNEL RELEASE Right 06/17/2019   CARPAL TUNNEL RELEASE Left  11/2020   CHOLECYSTECTOMY  ~2006   COLONOSCOPY WITH PROPOFOL N/A 03/11/2020   Procedure: COLONOSCOPY WITH PROPOFOL;  Surgeon: Bernette Redbird, MD;  Location: [facility];  Service: Endoscopy;  Laterality: N/A;   DILATION AND CURETTAGE OF UTERUS     after SAB   ESOPHAGOGASTRODUODENOSCOPY  02/09/2014   Dr. Matthias Hughs   ESOPHAGOGASTRODUODENOSCOPY (EGD) WITH PROPOFOL N/A 03/11/2020   Procedure: ESOPHAGOGASTRODUODENOSCOPY (EGD) WITH PROPOFOL;  Surgeon: Bernette Redbird, MD;  Location: [facility];  Service: Endoscopy;  Laterality: N/A;   FINGER SURGERY Left    index   NASAL SINUS SURGERY     x 2 remotely, Dr Ezzard Standing   PACEMAKER IMPLANT N/A 09/13/2020   Procedure: PACEMAKER IMPLANT;  Surgeon: Duke Salvia, MD;  Location: [facility] INVASIVE CV LAB;  Service: Cardiovascular;  Laterality: N/A;   PARATHYROIDECTOMY Left 09/14/2022   Procedure: LEFT PARATHYROIDECTOMY;  Surgeon: Darnell Level, MD;  Location: [facility];  Service: General;  Laterality: Left;   TONSILLECTOMY     TOTAL ABDOMINAL HYSTERECTOMY W/ BILATERAL SALPINGOOPHORECTOMY     TOTAL HIP ARTHROPLASTY Left 07/18/2021   Procedure: TOTAL HIP ARTHROPLASTY ANTERIOR APPROACH;  Surgeon: Samson Frederic, MD;  Location: [facility];  Service: Orthopedics;  Laterality: Left;   TOTAL KNEE ARTHROPLASTY Right ~2008    Current Outpatient Medications  Medication Instructions  acetaminophen (TYLENOL) 500 mg, Every 6 hours PRN   acetaminophen-codeine (TYLENOL #3) 300-30 MG tablet 1 tablet, Oral, At bedtime PRN   ARTIFICIAL TEAR SOLUTION OP 1 drop, 4 times daily PRN   cephALEXin (KEFLEX) 500 mg, Oral, 4 times daily   Chromium Picolinate 500 mcg, Daily after supper   Co Q 10 100 mg, Daily   colestipol (COLESTID) 2 g, 2 times daily   CRANBERRY EXTRACT PO 2 tablets, Daily at bedtime   diltiazem (CARDIZEM) 30 MG tablet Take 1 tablet every 4 hours AS NEEDED for heart rate >100 as long as blood pressure >100.   dofetilide (TIKOSYN) 250 mcg, Oral, 2 times daily    estradiol (ESTRACE) 0.1 MG/GM vaginal cream 1 Applicatorful, Vaginal, 3 times weekly   ferrous fumarate (HEMOCYTE - 106 MG FE) 325 (106 Fe) MG TABS tablet 106 mg of iron, Oral, 2 times daily, Take apart from pantoprazole   fluticasone (FLONASE) 50 MCG/ACT nasal spray 1 spray, Daily   folic acid (FOLVITE) 800 mcg, Every morning   furosemide (LASIX) 40 mg, Oral, As needed   lidocaine (LIDODERM) 5 % 1 patch, Transdermal, Every 24 hours, Remove & Discard patch within 12 hours or as directed by MD   MAGNESIUM GLYCINATE PO 740 mg, Daily   olopatadine (PATANOL) 0.1 % ophthalmic solution 1 drop, Daily PRN   pantoprazole (PROTONIX) 40 mg, Oral, 2 times daily   potassium chloride SA (KLOR-CON M) 20 MEQ tablet TAKE 1 TABLET(20 MEQ) BY MOUTH DAILY   pravastatin (PRAVACHOL) 40 mg, Oral, Every evening   rivaroxaban (XARELTO) 20 mg, Oral, Daily with supper   spironolactone (ALDACTONE) 25 MG tablet TAKE 1 TABLET(25 MG) BY MOUTH DAILY   sucralfate (CARAFATE) 1 g, 2 times daily   valsartan (DIOVAN) 40 mg, Oral, Daily       Objective:   Physical Exam BP 112/70   Pulse 65   Temp 97.6 F (36.4 C) (Oral)   Resp 18   Ht 5' 7.5" (1.715 m)   Wt 249 lb 8 oz (113.2 kg)   SpO2 99%   BMI 38.50 kg/m  General:   Well developed, NAD, BMI noted.  HEENT:  Normocephalic . Face symmetric, atraumatic Lungs:  CTA B Normal respiratory effort, no intercostal retractions, no accessory muscle use. Heart: RRR,  no murmur.  Abdomen:  Not distended, soft, non-tender. No rebound or rigidity.   Skin: Not pale. Not jaundice Lower extremities: Left leg with no edema, skin with mild discoloration/darkness. Neurologic:  alert & oriented X3.  Speech normal, gait appropriate for age and unassisted Psych--  Cognition and judgment appear intact.  Cooperative with normal attention span and concentration.  Behavior appropriate. No anxious or depressed appearing.     Assessment      Assessment: Prediabetes HTN Hyperlipidemia Chronic lower extremity edema L>R (eval by previous pcp, related to varicose veins?) CV:  -- P. Atrial fibrillation, A. flutter: on xarelto flecainide-diltiazem  prn --Ablation 09/2017 --Pacemaker 09-2020 GI: Dr Matthias Hughs  --GERD (zantac or protonix prn), IBS, chronic diarrhea --GI records: Colonoscopies 1998, 2004, 2009 8 and 07-2013. Negative for polyps or IBD. + Diverticuli. EGD/cscope 11/2016; EGD/Cscope 03/11/2020   --Increased LFTs: CT abdomen 05/2016: Normal liver. 05/2017: WNL  Hepatitis B and C, Alpha 1 antitrypsin, anti-smooth muscle antibody, ANA trans-ferritin  OSA--- CPAP, Dr. Earl Gala Osteopenia:  Multiple  Dexas, last few:  T score 2009  -1.1; T score 2015  -0.7; T score 05/2017 -1.8 DJD --Chronic back pain-- tylenol #3 prn --- RF  per PCP --history of epidurals in the past, MRI 2014: Scoliosis,DJD --CIPRO pre dental d/t knee replacement (rx by ortho) H/o endometrial cancer, found during a hysterectomy, no chemotherapy or XRT.  Primary hyperparathyroidism: Chronic hypercalcemia, L parathyroidectomy 09/14/2022  PLAN:  Anemia profile CBC UA urine culture vitamin D  Diarrhea: Ongoing issue. Labs from GI 01/12/2023: Potassium 3.7, calcium 9.1, "no evidence of C. difficile". They recommended colestipol-Lomotil- cholestyramine. Currently on colestipol and lomotil.  Recommend to continue working with GI.  HTN: BP looks good, recent BMP satisfactory, continue Diovan (1/4 tablet a day), Lasix, potassium, Aldactone. Iron deficiency: Ferritin was in the low side when we last checked, on supplements, checking a CBC and iron panel.  Further advised for results. Vitamin D deficiency: Currently on OTC, request testing.  Will do. LUTS: As described above, check UA urine culture, stressed the need of getting clean, noncontaminated sample. DJD: Orthopedic recommended physical therapy which she is doing, request Tylenol 3 refill, PDMP okay, Rx sent.     Primary hyperparathyroidism: Last calcium WNL. RTC 3 months, CPX

## 2023-03-07 NOTE — Patient Instructions (Signed)
  Check the  blood pressure regularly Blood pressure goal:  between 110/65 and  135/85. If it is consistently higher or lower, let me know     GO TO THE LAB : Get the blood work     Please go to the front desk and schedule the following: Physical exam in 3 months.

## 2023-03-08 ENCOUNTER — Encounter: Payer: Self-pay | Admitting: Internal Medicine

## 2023-03-08 NOTE — Assessment & Plan Note (Signed)
 Diarrhea: Ongoing issue.Labs from GI 01/12/2023: Potassium 3.7, calcium 9.1, "no evidence of C. difficile". They recommended colestipol-Lomotil- cholestyramine. Currently on colestipol and lomotil.  Recommend to continue working with GI. HTN: BP looks good, recent BMP satisfactory, continue Diovan (1/4 tablet a day), Lasix, potassium, Aldactone. Iron deficiency: Ferritin was in the low side when we last checked, on supplements, checking a CBC and iron panel.  Further advised for results. Vitamin D deficiency: Currently on OTC, request testing.  Will do. LUTS: As described above, check UA urine culture, stressed the need of getting clean, noncontaminated sample. DJD: Orthopedic recommended physical therapy which she is doing, request Tylenol# 3 refill, PDMP okay, Rx sent. Primary hyperparathyroidism: Last calcium WNL. RTC 3 months, CPX

## 2023-03-09 ENCOUNTER — Other Ambulatory Visit (HOSPITAL_BASED_OUTPATIENT_CLINIC_OR_DEPARTMENT_OTHER): Payer: Self-pay | Admitting: Internal Medicine

## 2023-03-09 DIAGNOSIS — Z139 Encounter for screening, unspecified: Secondary | ICD-10-CM

## 2023-03-09 LAB — DRUG MONITORING PANEL 375977 , URINE

## 2023-03-09 LAB — URINE CULTURE
MICRO NUMBER:: 16162333
Result:: NO GROWTH
SPECIMEN QUALITY:: ADEQUATE

## 2023-03-09 LAB — DM TEMPLATE

## 2023-03-12 ENCOUNTER — Ambulatory Visit: Admitting: Physical Therapy

## 2023-03-12 ENCOUNTER — Encounter: Payer: Self-pay | Admitting: Physical Therapy

## 2023-03-12 ENCOUNTER — Encounter: Payer: Self-pay | Admitting: Internal Medicine

## 2023-03-12 DIAGNOSIS — R2689 Other abnormalities of gait and mobility: Secondary | ICD-10-CM

## 2023-03-12 DIAGNOSIS — M6281 Muscle weakness (generalized): Secondary | ICD-10-CM

## 2023-03-12 DIAGNOSIS — F40298 Other specified phobia: Secondary | ICD-10-CM

## 2023-03-12 NOTE — Therapy (Signed)
 OUTPATIENT PHYSICAL THERAPY LOWER EXTREMITY TREATMENT/RECERT     Patient Name: Veronica Bradford MRN: [id number] DOB:[birth year], 78 y.o., female Today's Date: 03/12/2023  END OF SESSION:  PT End of Session - 03/12/23 1556     Visit Number 16    Date for PT Re-Evaluation 03/12/23    Authorization Type [facility] [facility]    PT Start Time 1522   pt few minutes   PT Stop Time 1600    PT Time Calculation (min) 38 min    Activity Tolerance Patient tolerated treatment well    Behavior During Therapy WFL for tasks assessed/performed                  Past Medical History:  Diagnosis Date   Atrial fibrillation (HCC)    Atrial fibrillation (HCC)    Back pain    CHF (congestive heart failure) (HCC)    grade 2 HF   Chronic sinusitis    s/p 2 surgeries remotely, Dr Ezzard Standing   Closed fracture of metatarsal of left foot    L foot, fifth metatarsal   Complication of anesthesia    woke up once during procedure   Dysrhythmia    a-fib   Ectopic pregnancy    Endometrial cancer (HCC) 2004   Early diagnosis   GERD (gastroesophageal reflux disease)    Hyperlipemia    Hypertension    IBS (irritable bowel syndrome) 08/13/2014   Dx 2015, diarrhea on-off, Dr Matthias Hughs    Lumbar radiculopathy 08/2012   MRI in 08/2012   Mild hyperparathyroidism (HCC)    Osteopenia    PAF (paroxysmal atrial fibrillation) (HCC)    Presence of permanent cardiac pacemaker    Sciatica of right side 08/2013   Received Depomedrol 80 mg injection   Sleep apnea 09/09/2013   Dx with OSA in 2015, by Dr. Graciela Husbands,   wears CPAPAPAP   Past Surgical History:  Procedure Laterality Date   ATRIAL FIBRILLATION ABLATION N/A 09/04/2017   Procedure: ATRIAL FIBRILLATION ABLATION;  Surgeon: Hillis Range, MD;  Location: [facility];  Service: Cardiovascular;  Laterality: N/A;   BIOPSY  03/11/2020   Procedure: BIOPSY;  Surgeon: Bernette Redbird, MD;  Location: [facility];  Service: Endoscopy;;  EGD and COLON   CARPAL TUNNEL  RELEASE Right 06/17/2019   CARPAL TUNNEL RELEASE Left 11/2020   CHOLECYSTECTOMY  ~2006   COLONOSCOPY WITH PROPOFOL N/A 03/11/2020   Procedure: COLONOSCOPY WITH PROPOFOL;  Surgeon: Bernette Redbird, MD;  Location: [facility];  Service: Endoscopy;  Laterality: N/A;   DILATION AND CURETTAGE OF UTERUS     after SAB   ESOPHAGOGASTRODUODENOSCOPY  02/09/2014   Dr. Matthias Hughs   ESOPHAGOGASTRODUODENOSCOPY (EGD) WITH PROPOFOL N/A 03/11/2020   Procedure: ESOPHAGOGASTRODUODENOSCOPY (EGD) WITH PROPOFOL;  Surgeon: Bernette Redbird, MD;  Location: [facility];  Service: Endoscopy;  Laterality: N/A;   FINGER SURGERY Left    index   NASAL SINUS SURGERY     x 2 remotely, Dr Ezzard Standing   PACEMAKER IMPLANT N/A 09/13/2020   Procedure: PACEMAKER IMPLANT;  Surgeon: Duke Salvia, MD;  Location: [facility] INVASIVE CV LAB;  Service: Cardiovascular;  Laterality: N/A;   PARATHYROIDECTOMY Left 09/14/2022   Procedure: LEFT PARATHYROIDECTOMY;  Surgeon: Darnell Level, MD;  Location: [facility];  Service: General;  Laterality: Left;   TONSILLECTOMY     TOTAL ABDOMINAL HYSTERECTOMY W/ BILATERAL SALPINGOOPHORECTOMY     TOTAL HIP ARTHROPLASTY Left 07/18/2021   Procedure: TOTAL HIP ARTHROPLASTY ANTERIOR APPROACH;  Surgeon: Samson Frederic, MD;  Location: [facility];  Service: Orthopedics;  Laterality: Left;   TOTAL KNEE ARTHROPLASTY Right ~2008   Patient Active Problem List   Diagnosis Date Noted   Class 2 obesity 11/29/2022   Cellulitis of left lower extremity 11/29/2022   Hyperparathyroidism, primary (HCC) 09/03/2022   DOE (dyspnea on exertion) 05/04/2022   Paroxysmal A-fib (HCC) 01/03/2022   Atrial fibrillation and flutter (HCC) - Persistent 08/16/2021   Closed left hip fracture, initial encounter (HCC) 07/14/2021   Chronic diastolic CHF (congestive heart failure) (HCC) 07/14/2021   Pacemaker 12/14/2020   RBBB 08/09/2020   PVC's (premature ventricular contractions) 08/09/2020   Sinus bradycardia - junctional rhythm 08/09/2020    Carpal tunnel syndrome, right 07/08/2019   Vitamin D deficiency 01/20/2019   Macular pucker, right eye 11/18/2018   Multiple pulmonary nodules determined by computed tomography of lung 12/06/2017   Paroxysmal atrial fibrillation (HCC) 09/04/2017   Osteopenia 05/23/2017   Annual physical exam 04/14/2015   PCP NOTES >>>>> 12/23/2014   IBS (irritable bowel syndrome) 08/13/2014   Hyperglycemia    Chronic sinusitis    Endometrial cancer (HCC)    Back pain-- on tylenol #3 prn    GERD (gastroesophageal reflux disease)    HTN (hypertension) 09/09/2013   Diastolic dysfunction-grade 2 09/09/2013   Chronic anticoagulation 09/09/2013   Edema of both legs 09/09/2013   Dyslipidemia 09/09/2013   Obesity (BMI 30-39.9) 09/09/2013   OSA on CPAP 09/09/2013    PCP: Willow Ora  REFERRING PROVIDER: Trudee Grip  REFERRING DIAG: Gait disturbances and LE weakness  THERAPY DIAG:  Other abnormalities of gait and mobility  Fear of falling  Muscle weakness (generalized)  Balance disorder  Rationale for Evaluation and Treatment: Rehabilitation  ONSET DATE: 10/06/22  SUBJECTIVE:   SUBJECTIVE STATEMENT:    Feeling pretty good, I've been pain free. Nothing really new, I've been doing good- have been going to the store by myself and feeling more brave  in general.    PERTINENT HISTORY: THA- 07/18/21 TKA 2008 Pacemaker 2022 Trochanteric bursitis OA L knee  A fib CHF   PAIN:  Are you having pain? No 0/10  PRECAUTIONS: None  RED FLAGS: None   WEIGHT BEARING RESTRICTIONS: No  FALLS:  Has patient fallen in last 6 months? No  LIVING ENVIRONMENT: Lives with: lives with their family Lives in: House/apartment Stairs: Yes: Internal: 14 steps; can reach both   OCCUPATION: Retired Engineer, civil (consulting)   PLOF: Independent  PATIENT GOALS: to feel more stable    OBJECTIVE:  Note: Objective measures were completed at Evaluation unless otherwise noted.  DIAGNOSTIC FINDINGS: N/A     COGNITION: Overall cognitive status: Within functional limits for tasks assessed     SENSATION: WFL   MUSCLE LENGTH: Hamstrings: tightness BLE  POSTURE: rounded shoulders and forward head   LOWER EXTREMITY ROM: some hip flexor limitation due to weakness   LOWER EXTREMITY MMT: hip flexor 3+/5 and 4/5 generally; 03/12/23 R 4/5, L 3+/5   FUNCTIONAL TESTS:  5 times sit to stand: 21.15s from chair; 3/10- 17.8 seconds from chair  Timed up and go (TUG): 10.19s 3/10- DNT   Berg Balance Scale: 48/56; 3/10 54/56    TODAY'S TREATMENT:  DATE:   03/12/23  Objective measures for progress note/recert LEFS  Education on POC moving forward and progress towards goals- we will shift focus to strengthening and progress to likely DC to advanced HEP at end of that period   Nustep L6 x5 minutes BLEs only        03/05/23 Nustep level 5 x 5 minutes Leg curls 25# x10, 35# 2x10 Leg extension 5# 2x10 On rocker board 2 ways reaching for numbers On airex ball toss On airex cone toe touches Direction changes Walking ball toss Ball kicks Gait outside in the grass uneven terrain with CGA  02/27/23 Nustep level 5 x 6 minutes Gait outside 1/2 parking [place] negotiating curbs Cone toe touches solid surface and on airex On airex number reaching  25# HS curls x10, then left only 20# x10, then 15# x10 Partial sit ups LEg press 20# x10, 30# 2x10 2.5# MArches, hip abduction and extension Resisted gait all directions  02/12/23 Nustep level 5 x 6 minutes Leg curls 20# LEg extension 5# 10# straight arm pulls in airex On rockerboard 2 ways reaching for numbers on wall Walking ball toss Direction changes Passive LE stretches Feet on ball K2C, trunk rotation, small bridge, isometric abs  02/05/23 Gait outside around the parking [place] CGA on curbs, one rest  break On rocker board 2 ways reaching for numbers on wall On airex volleyball Ball kicks Leg curls 25# 2x10, left only 2x15 15# On airex 10# straight arm pulls and then alternating 5# hip extension 2x10 5# hip abduction 2x 5 Gait outside again in the grass, uneven terrain, slower and light CGA at times  01/29/23 Gait outside around the parking [place] 2 rest breaks 40# resisted gait all directions 20# leg curls On airex ball toss and volleyball Airex balance beam side stepping and tandem walking in pbars On rockerboard two ways reaching for numbers Walking ball toss 20# leg press , tried single legs but left knee pain, did SL without weight Direction changes  01/22/23 Bike level 3 x 3 minutes Side stepping on airex balance beam Feet on ball K2C, rotation, bridge and isometric abs Green tband clamshells Ball b/n knees squeeze Black tband supine hip extension On airex ball toss, volley ball and head turns  01/15/23 Nustep Level 5 x 6 minutes Passive stretch LE's Leg curls 20# 2x10 Calf stretches Tmill pushes 15 seconds x 3 On airex 5# straight arm pulls On Bosu balance and reaching  On airex reaching for numbers Step turn reach Resisted gait forward 1 lap Ball kicks On airex ball toss and volleyball Cone toe touches   PATIENT EDUCATION:  Education details: POC and HEP Person educated: Patient Education method: Explanation Education comprehension: verbalized understanding  HOME EXERCISE PROGRAM: Access Code: [email or web address] URL: [email or web address] Date: 10/31/2022 Prepared by: Cassie Freer  Exercises - Standing Tandem Balance with Counter Support  - 1 x daily - 7 x weekly - 2 reps - 30 hold - Single Leg Stance with Support  - 1 x daily - 7 x weekly - 2 reps - 10 hold - Sit to Stand  - 1 x daily - 7 x weekly - 2 sets - 10 reps - Standing Marching  - 1 x daily - 7 x weekly - 2 sets - 10 reps  ASSESSMENT:  CLINICAL IMPRESSION:  Pt arrives today doing  OK, per objective measures last main remaining impairment is really functional strength. Some of this may be coming from her knee, some may be coming from  ongoing issues with post-surgical hip. We will plan to briefly extend POC for about 4 more weeks with focus on strength and progression towards DC with finalized HEP.     OBJECTIVE IMPAIRMENTS: Abnormal gait, decreased activity tolerance, decreased balance, decreased coordination, difficulty walking, and decreased strength.   ACTIVITY LIMITATIONS: squatting, stairs, and locomotion level  REHAB POTENTIAL: Good  CLINICAL DECISION MAKING: Stable/uncomplicated  EVALUATION COMPLEXITY: Low  GOALS: Goals reviewed with patient? Yes  SHORT TERM GOALS: Target date: 12/12/22  Patient will be independent with initial HEP. Goal status: met 01/15/23  2.  Patient will demonstrate decreased 5xSTS from chair to <15s Baseline: 21.15s  Goal status: ONGOING  03/12/23  LONG TERM GOALS: Target date: 04/09/2023    Patient will be independent with advanced/ongoing HEP to improve outcomes and carryover.  Goal status: progressing 12/28/22  2.  Patient will be able to do SLS for 5-10s.  Baseline: 2s  Goal status: met 02/27/23  3.  Patient will be able to navigate stairs with reciprocal alternating pattern.  Baseline: does 1 steps at a time  Goal status: DEFERRED 03/12/23- step over step is still scary, still goes step to step  but this is her baseline and she reports due to knee more than hip   4.  Patient will demonstrate improved hip flexor strength to 5/5 Baseline: 3+/5 , 12/28/22 4-/5 bilaterally Goal status: ongoing 03/12/23  5.  Patient will be able to do full tandem stance for 15s or better. Baseline: unable to get in position Goal status: met 03/12/23  6.  Patient will score 53 on Berg Balance test to demonstrate lower risk of falls.  Baseline: 48 Goal status:  03/12/23- 54/56   PLAN:  PT FREQUENCY: 2x/week  PT DURATION: 4  weeks  PLANNED INTERVENTIONS: 97110-Therapeutic exercises, 97530- Therapeutic activity, 97112- Neuromuscular re-education, 97535- Self Care, 14782- Manual therapy, [phone]- Gait training, Patient/Family education, Balance training, Stair training, Cryotherapy, and Moist heat  PLAN FOR NEXT SESSION: strength focus, work towards DC   [employer], PT, DPT 03/12/23 4:03 PM

## 2023-03-13 ENCOUNTER — Ambulatory Visit: Payer: Medicare Other

## 2023-03-13 DIAGNOSIS — R001 Bradycardia, unspecified: Secondary | ICD-10-CM

## 2023-03-14 DIAGNOSIS — D489 Neoplasm of uncertain behavior, unspecified: Secondary | ICD-10-CM | POA: Diagnosis not present

## 2023-03-14 DIAGNOSIS — C44319 Basal cell carcinoma of skin of other parts of face: Secondary | ICD-10-CM | POA: Diagnosis not present

## 2023-03-14 LAB — CUP PACEART REMOTE DEVICE CHECK
Battery Remaining Longevity: 90 mo
Battery Remaining Percentage: 78 %
Battery Voltage: 3.01 V
Brady Statistic AP VP Percent: 1 %
Brady Statistic AP VS Percent: 89 %
Brady Statistic AS VP Percent: 1 %
Brady Statistic AS VS Percent: 11 %
Brady Statistic RA Percent Paced: 89 %
Brady Statistic RV Percent Paced: 1 %
Date Time Interrogation Session: 20250311020015
Implantable Lead Connection Status: 753985
Implantable Lead Connection Status: 753985
Implantable Lead Implant Date: 20220912
Implantable Lead Implant Date: 20220912
Implantable Lead Location: 753859
Implantable Lead Location: 753860
Implantable Lead Model: 3830
Implantable Pulse Generator Implant Date: 20220912
Lead Channel Impedance Value: 380 Ohm
Lead Channel Impedance Value: 590 Ohm
Lead Channel Pacing Threshold Amplitude: 0.75 V
Lead Channel Pacing Threshold Amplitude: 1 V
Lead Channel Pacing Threshold Pulse Width: 0.4 ms
Lead Channel Pacing Threshold Pulse Width: 0.4 ms
Lead Channel Sensing Intrinsic Amplitude: 1 mV
Lead Channel Sensing Intrinsic Amplitude: 12 mV
Lead Channel Setting Pacing Amplitude: 2 V
Lead Channel Setting Pacing Amplitude: 2.5 V
Lead Channel Setting Pacing Pulse Width: 0.4 ms
Lead Channel Setting Sensing Sensitivity: 2 mV
Pulse Gen Model: 2272
Pulse Gen Serial Number: 3956585

## 2023-03-15 ENCOUNTER — Encounter (HOSPITAL_BASED_OUTPATIENT_CLINIC_OR_DEPARTMENT_OTHER): Payer: Self-pay

## 2023-03-15 ENCOUNTER — Ambulatory Visit (HOSPITAL_BASED_OUTPATIENT_CLINIC_OR_DEPARTMENT_OTHER)
Admission: RE | Admit: 2023-03-15 | Discharge: 2023-03-15 | Disposition: A | Discharge status: Home / Self Care | Source: Ambulatory Visit | Attending: Internal Medicine | Admitting: Internal Medicine

## 2023-03-15 DIAGNOSIS — Z1231 Encounter for screening mammogram for malignant neoplasm of breast: Secondary | ICD-10-CM | POA: Diagnosis not present

## 2023-03-15 DIAGNOSIS — Z139 Encounter for screening, unspecified: Secondary | ICD-10-CM

## 2023-03-19 ENCOUNTER — Other Ambulatory Visit: Payer: Self-pay | Admitting: Internal Medicine

## 2023-03-19 DIAGNOSIS — I480 Paroxysmal atrial fibrillation: Secondary | ICD-10-CM

## 2023-03-20 MED ORDER — DILTIAZEM HCL 30 MG PO TABS: ORAL_TABLET | ORAL | 11 refills | Status: DC

## 2023-03-20 NOTE — Telephone Encounter (Signed)
 Xarelto 20mg  refill request received. Pt is 77 years old, weight-113.2kg, Crea-0.67 on 12/06/22, last seen by Dr. Graciela Husbands on 10/05/22, Diagnosis-Afib, CrCl-129.65 mL/min; Dose is appropriate based on dosing criteria. Will send in refill to requested pharmacy.

## 2023-03-21 ENCOUNTER — Ambulatory Visit: Payer: Self-pay

## 2023-03-21 NOTE — Patient Instructions (Incomplete)
 I hope you feel better soon- I will be in touch with your urine culture and labs asap Can start treatment with keflex for now  If you are getting worse in the meantime please let us know!  We can do a CT scan if your symptoms persist and/ or your urine culture is negative for infection  If you are not doing ok please go to the ER over the weekend

## 2023-03-21 NOTE — Telephone Encounter (Signed)
 Pt has an appt on 03/22/23

## 2023-03-21 NOTE — Telephone Encounter (Signed)
 Copied from CRM [id number]. Topic: Clinical - Red Word Triage >> Mar 21, 2023  3:21 PM Sim Boast F wrote: Red Word that prompted transfer to Nurse Triage: Patient having lower left abdominal pain 7/10 going on 3-4 days now   Chief Complaint: Abd pain Symptoms: Lower left abd pain, frequency, burning with urination Frequency: On and off for at least 1 week Pertinent Negatives: Patient denies fever Disposition: [] ED /[] Urgent Care (no appt availability in office) / [x] Appointment(In office/virtual)/ []  [place] [facility]/ [] Home Care/ [] Refused Recommended Disposition /[]  Mobile Bus/ []  Follow-up with PCP Additional Notes: Patient stated that she is having left lower abdominal pain and mild burning with urination. Appointment scheduled for tomorrow.  Reason for Disposition  [1] MODERATE pain (e.g., interferes with normal activities) AND [2] pain comes and goes (cramps) AND [3] present > 24 hours  (Exception: Pain with Vomiting or Diarrhea - see that Guideline.)  Answer Assessment - Initial Assessment Questions 1. LOCATION: "Where does it hurt?"      Left lower quadrant, over bladder and perineum  2. RADIATION: "Does the pain shoot anywhere else?" (e.g., chest, back)     No  3. ONSET: "When did the pain begin?" (e.g., minutes, hours or days ago)      Comes and goes since last week  4. SEVERITY: "How bad is the pain?"  (e.g., Scale 1-10; mild, moderate, or severe)    - MILD (1-3): Doesn't interfere with normal activities, abdomen soft and not tender to touch.     - MODERATE (4-7): Interferes with normal activities or awakens from sleep, abdomen tender to touch.     - SEVERE (8-10): Excruciating pain, doubled over, unable to do any normal activities.       Pain level is between 4-6  5. CAUSE: "What do you think is causing the stomach pain?"     Possible UTI  6. OTHER SYMPTOMS: "Do you have any other symptoms?" (e.g., back pain, diarrhea, fever, urination pain, vomiting)        Mild burning with urination, frequency  Protocols used: Abdominal Pain - Female-A-AH

## 2023-03-21 NOTE — Progress Notes (Unsigned)
 [facility] [address] [place], [place] [address] [phone]  Date:  03/22/2023   Name:  Veronica Bradford   DOB:  1945/05/10   MRN:  [id number]  PCP:  Wanda Plump, MD    Chief Complaint: No chief complaint on file.   History of Present Illness:  Veronica Bradford is a 78 y.o. very pleasant female patient who presents with the following:  Primary patient of my partner Dr. Drue Novel seen today with concern of abdominal pain I have not seen her myself previously She saw Dr. Drue Novel for routine follow-up earlier this month History of hypertension, endometrial cancer, primary hyperparathyroidism status post parathyroidectomy, prediabetes, hyperlipidemia, atrial fibrillation on Xarelto, flecainide, diltiazem as needed.  She had a pacemaker placed in 2022  Her GI doc is Dr.Buccini  Diverticulosis was seen on previous CT abdomen pelvis Patient Active Problem List   Diagnosis Date Noted   Class 2 obesity 11/29/2022   Cellulitis of left lower extremity 11/29/2022   Hyperparathyroidism, primary (HCC) 09/03/2022   DOE (dyspnea on exertion) 05/04/2022   Paroxysmal A-fib (HCC) 01/03/2022   Atrial fibrillation and flutter (HCC) - Persistent 08/16/2021   Closed left hip fracture, initial encounter (HCC) 07/14/2021   Chronic diastolic CHF (congestive heart failure) (HCC) 07/14/2021   Pacemaker 12/14/2020   RBBB 08/09/2020   PVC's (premature ventricular contractions) 08/09/2020   Sinus bradycardia - junctional rhythm 08/09/2020   Carpal tunnel syndrome, right 07/08/2019   Vitamin D deficiency 01/20/2019   Macular pucker, right eye 11/18/2018   Multiple pulmonary nodules determined by computed tomography of lung 12/06/2017   Paroxysmal atrial fibrillation (HCC) 09/04/2017   Osteopenia 05/23/2017   Annual physical exam 04/14/2015   PCP NOTES >>>>> 12/23/2014   IBS (irritable bowel syndrome) 08/13/2014   Hyperglycemia    Chronic sinusitis     Endometrial cancer (HCC)    Back pain-- on tylenol #3 prn    GERD (gastroesophageal reflux disease)    HTN (hypertension) 09/09/2013   Diastolic dysfunction-grade 2 09/09/2013   Chronic anticoagulation 09/09/2013   Edema of both legs 09/09/2013   Dyslipidemia 09/09/2013   Obesity (BMI 30-39.9) 09/09/2013   OSA on CPAP 09/09/2013    Past Medical History:  Diagnosis Date   Atrial fibrillation (HCC)    Atrial fibrillation (HCC)    Back pain    CHF (congestive heart failure) (HCC)    grade 2 HF   Chronic sinusitis    s/p 2 surgeries remotely, Dr Ezzard Standing   Closed fracture of metatarsal of left foot    L foot, fifth metatarsal   Complication of anesthesia    woke up once during procedure   Dysrhythmia    a-fib   Ectopic pregnancy    Endometrial cancer (HCC) 2004   Early diagnosis   GERD (gastroesophageal reflux disease)    Hyperlipemia    Hypertension    IBS (irritable bowel syndrome) 08/13/2014   Dx 2015, diarrhea on-off, Dr Matthias Hughs    Lumbar radiculopathy 08/2012   MRI in 08/2012   Mild hyperparathyroidism (HCC)    Osteopenia    PAF (paroxysmal atrial fibrillation) (HCC)    Presence of permanent cardiac pacemaker    Sciatica of right side 08/2013   Received Depomedrol 80 mg injection   Sleep apnea 09/09/2013   Dx with OSA in 2015, by Dr. Graciela Husbands,   wears CPAPAPAP    Past Surgical History:  Procedure Laterality Date  ATRIAL FIBRILLATION ABLATION N/A 09/04/2017   Procedure: ATRIAL FIBRILLATION ABLATION;  Surgeon: Hillis Range, MD;  [facility]: [facility] INVASIVE CV LAB;  Service: Cardiovascular;  Laterality: N/A;   BIOPSY  03/11/2020   Procedure: BIOPSY;  Surgeon: Bernette Redbird, MD;  Location: [facility];  Service: Endoscopy;;  EGD and COLON   CARPAL TUNNEL RELEASE Right 06/17/2019   CARPAL TUNNEL RELEASE Left 11/2020   CHOLECYSTECTOMY  ~2006   COLONOSCOPY WITH PROPOFOL N/A 03/11/2020   Procedure: COLONOSCOPY WITH PROPOFOL;  Surgeon: Bernette Redbird, MD;  Location: [facility]  ENDOSCOPY;  Service: Endoscopy;  Laterality: N/A;   DILATION AND CURETTAGE OF UTERUS     after SAB   ESOPHAGOGASTRODUODENOSCOPY  02/09/2014   Dr. Matthias Hughs   ESOPHAGOGASTRODUODENOSCOPY (EGD) WITH PROPOFOL N/A 03/11/2020   Procedure: ESOPHAGOGASTRODUODENOSCOPY (EGD) WITH PROPOFOL;  Surgeon: Bernette Redbird, MD;  Location: [facility];  Service: Endoscopy;  Laterality: N/A;   FINGER SURGERY Left    index   NASAL SINUS SURGERY     x 2 remotely, Dr Ezzard Standing   PACEMAKER IMPLANT N/A 09/13/2020   Procedure: PACEMAKER IMPLANT;  Surgeon: Duke Salvia, MD;  Location: [facility] INVASIVE CV LAB;  Service: Cardiovascular;  Laterality: N/A;   PARATHYROIDECTOMY Left 09/14/2022   Procedure: LEFT PARATHYROIDECTOMY;  Surgeon: Darnell Level, MD;  Location: [facility];  Service: General;  Laterality: Left;   TONSILLECTOMY     TOTAL ABDOMINAL HYSTERECTOMY W/ BILATERAL SALPINGOOPHORECTOMY     TOTAL HIP ARTHROPLASTY Left 07/18/2021   Procedure: TOTAL HIP ARTHROPLASTY ANTERIOR APPROACH;  Surgeon: Samson Frederic, MD;  Location: [facility];  Service: Orthopedics;  Laterality: Left;   TOTAL KNEE ARTHROPLASTY Right ~2008    Social History   Tobacco Use   Smoking status: Former    Current packs/day: 0.00    Types: Cigarettes    Quit date: 01/06/1980    Years since quitting: 43.2   Smokeless tobacco: Never   Tobacco comments:    smoked from 1970 to 1982, less than 1 ppd  Vaping Use   Vaping status: Never Used  Substance Use Topics   Alcohol use: Yes    Alcohol/week: 1.0 standard drink of alcohol    Types: 1 Glasses of wine per week    Comment: rare   Drug use: Never    Family History  Problem Relation Age of Onset   Hypertension Mother    Colon cancer Mother    Breast cancer Mother    Ovarian cancer Mother    Hypertension Father    Diabetes Father    Head & neck cancer Sister    CAD Neg Hx     Allergies  Allergen Reactions   Ibuprofen Other (See Comments)    Pt is on Xarelto   Lactose Intolerance (Gi)  Diarrhea and Nausea And Vomiting   Lisinopril Cough   Nsaids Other (See Comments)    Pt on Xarelto   Tape     Causes blisters    Tolmetin Other (See Comments)    Pt on Xarelto   Zoster Vaccine Live Other (See Comments)    Serum sickness, high fever, neck pain    Amoxicillin Rash   Penicillins Rash    Local reaction    Medication list has been reviewed and updated.  Current Outpatient Medications on File Prior to Visit  Medication Sig Dispense Refill   acetaminophen (TYLENOL) 500 MG tablet Take 500 mg by mouth every 6 (six) hours as needed for moderate pain or headache.     acetaminophen-codeine (TYLENOL #3)  300-30 MG tablet Take 1 tablet by mouth at bedtime as needed for moderate pain (pain score 4-6). 30 tablet 0   ARTIFICIAL TEAR SOLUTION OP Place 1 drop into both eyes 4 (four) times daily as needed (dry/irritated eyes.).     Chromium Picolinate 500 MCG TABS Take 500 mcg by mouth daily after supper.     Coenzyme Q10 (CO Q 10) 100 MG CAPS Take 100 mg by mouth daily.     colestipol (COLESTID) 1 g tablet Take 2 g by mouth 2 (two) times daily.     CRANBERRY EXTRACT PO Take 2 tablets by mouth at bedtime.     diltiazem (CARDIZEM) 30 MG tablet Take 1 tablet every 4 hours AS NEEDED for heart rate >100 as long as blood pressure >100. 120 tablet 11   diphenoxylate-atropine (LOMOTIL) 2.5-0.025 MG tablet Take 1 tablet by mouth 4 (four) times daily as needed.     dofetilide (TIKOSYN) 250 MCG capsule TAKE 1 CAPSULE BY MOUTH 2 TIMES DAILY. 180 capsule 2   estradiol (ESTRACE) 0.1 MG/GM vaginal cream Place 1 Applicatorful vaginally 3 (three) times a week. 42.5 g 12   ferrous fumarate (HEMOCYTE - 106 MG FE) 325 (106 Fe) MG TABS tablet Take 1 tablet (106 mg of iron total) by mouth 2 (two) times daily. Take apart from pantoprazole 180 tablet 1   fluticasone (FLONASE) 50 MCG/ACT nasal spray Place 1 spray into both nostrils daily.     folic acid (FOLVITE) 800 MCG tablet Take 800 mcg by mouth in the  morning.     furosemide (LASIX) 40 MG tablet Take 1 tablet (40 mg total) by mouth as needed. 30 tablet 11   MAGNESIUM GLYCINATE PO Take 740 mg by mouth daily.     olopatadine (PATANOL) 0.1 % ophthalmic solution Place 1 drop into both eyes daily as needed (allergies).     pantoprazole (PROTONIX) 40 MG tablet Take 1 tablet (40 mg total) by mouth 2 (two) times daily. (Patient taking differently: Take 40 mg by mouth daily before supper.)     potassium chloride SA (KLOR-CON M) 20 MEQ tablet TAKE 1 TABLET(20 MEQ) BY MOUTH DAILY 90 tablet 2   pravastatin (PRAVACHOL) 40 MG tablet Take 1 tablet (40 mg total) by mouth every evening. 90 tablet 3   rivaroxaban (XARELTO) 20 MG TABS tablet TAKE 1 TABLET(20 MG) BY MOUTH DAILY WITH SUPPER 90 tablet 1   spironolactone (ALDACTONE) 25 MG tablet TAKE 1 TABLET(25 MG) BY MOUTH DAILY (Patient taking differently: Take 25 mg by mouth daily.) 90 tablet 3   sucralfate (CARAFATE) 1 g tablet Take 1 g by mouth 2 (two) times daily.     valsartan (DIOVAN) 40 MG tablet Take 1 tablet (40 mg total) by mouth daily. (Patient taking differently: Take 10 mg by mouth at bedtime.) 90 tablet 3   No current facility-administered medications on file prior to visit.    Review of Systems:  As per HPI- otherwise negative. GEN: no acute distress. HEENT: Atraumatic, Normocephalic.  Ears and Nose: No external deformity. CV: RRR, No M/G/R. No JVD. No thrill. No extra heart sounds. PULM: CTA B, no wheezes, crackles, rhonchi. No retractions. No resp. distress. No accessory muscle use. ABD: S, NT, ND, +BS. No rebound. No HSM. EXTR: No c/c/e PSYCH: Normally interactive. Conversant.    Physical Examination: There were no vitals filed for this visit. There were no vitals filed for this visit. There is no height or weight on file to calculate BMI. Ideal  Body Weight:    ***  Assessment and Plan: ***  Signed Abbe Amsterdam, MD

## 2023-03-22 ENCOUNTER — Encounter: Payer: Self-pay | Admitting: Family Medicine

## 2023-03-22 ENCOUNTER — Ambulatory Visit (INDEPENDENT_AMBULATORY_CARE_PROVIDER_SITE_OTHER): Admitting: Family Medicine

## 2023-03-22 VITALS — BP 112/62 | HR 62 | Temp 98.4°F | Resp 18 | Ht 67.5 in | Wt 254.2 lb

## 2023-03-22 DIAGNOSIS — R300 Dysuria: Secondary | ICD-10-CM

## 2023-03-22 DIAGNOSIS — R1032 Left lower quadrant pain: Secondary | ICD-10-CM | POA: Diagnosis not present

## 2023-03-22 LAB — POCT URINALYSIS DIP (MANUAL ENTRY)
Bilirubin, UA: NEGATIVE
Blood, UA: NEGATIVE
Glucose, UA: NEGATIVE mg/dL
Ketones, POC UA: NEGATIVE mg/dL
Nitrite, UA: NEGATIVE
Protein Ur, POC: NEGATIVE mg/dL
Spec Grav, UA: 1.01 (ref 1.010–1.025)
Urobilinogen, UA: 0.2 U/dL
pH, UA: 6 (ref 5.0–8.0)

## 2023-03-22 LAB — CBC
HCT: 40.6 % (ref 36.0–46.0)
Hemoglobin: 13.5 g/dL (ref 12.0–15.0)
MCHC: 33.1 g/dL (ref 30.0–36.0)
MCV: 89 fl (ref 78.0–100.0)
Platelets: 230 10*3/uL (ref 150.0–400.0)
RBC: 4.57 Mil/uL (ref 3.87–5.11)
RDW: 15.3 % (ref 11.5–15.5)
WBC: 5.2 10*3/uL (ref 4.0–10.5)

## 2023-03-22 LAB — COMPREHENSIVE METABOLIC PANEL
ALT: 15 U/L (ref 0–35)
AST: 19 U/L (ref 0–37)
Albumin: 4.2 g/dL (ref 3.5–5.2)
Alkaline Phosphatase: 65 U/L (ref 39–117)
BUN: 18 mg/dL (ref 6–23)
CO2: 25 meq/L (ref 19–32)
Calcium: 9.6 mg/dL (ref 8.4–10.5)
Chloride: 105 meq/L (ref 96–112)
Creatinine, Ser: 0.71 mg/dL (ref 0.40–1.20)
GFR: 83.27 mL/min (ref >60.00)
Glucose, Bld: 104 mg/dL — ABNORMAL HIGH (ref 70–99)
Potassium: 4.1 meq/L (ref 3.5–5.1)
Sodium: 139 meq/L (ref 135–145)
Total Bilirubin: 0.5 mg/dL (ref 0.2–1.2)
Total Protein: 6.4 g/dL (ref 6.0–8.3)

## 2023-03-22 MED ORDER — CEPHALEXIN 500 MG PO CAPS: 500.0000 mg | ORAL_CAPSULE | Freq: Two times a day (BID) | ORAL | 0 refills | Status: DC

## 2023-03-23 LAB — URINE CULTURE
MICRO NUMBER:: 16226290
Result:: NO GROWTH
SPECIMEN QUALITY:: ADEQUATE

## 2023-03-24 ENCOUNTER — Encounter: Payer: Self-pay | Admitting: Family Medicine

## 2023-03-24 DIAGNOSIS — R1030 Lower abdominal pain, unspecified: Secondary | ICD-10-CM

## 2023-03-26 NOTE — Addendum Note (Signed)
 Addended by: Pearline Cables on: 03/26/2023 05:23 PM   Modules accepted: Orders

## 2023-03-27 ENCOUNTER — Encounter: Payer: Self-pay | Admitting: Physical Therapy

## 2023-03-27 ENCOUNTER — Encounter: Payer: Self-pay | Admitting: Internal Medicine

## 2023-03-27 ENCOUNTER — Ambulatory Visit: Payer: Medicare Other | Attending: Internal Medicine | Admitting: Internal Medicine

## 2023-03-27 ENCOUNTER — Ambulatory Visit: Admitting: Physical Therapy

## 2023-03-27 VITALS — BP 128/66 | HR 68 | Ht 67.5 in | Wt 251.2 lb

## 2023-03-27 DIAGNOSIS — R2689 Other abnormalities of gait and mobility: Secondary | ICD-10-CM | POA: Diagnosis not present

## 2023-03-27 DIAGNOSIS — I4891 Unspecified atrial fibrillation: Secondary | ICD-10-CM | POA: Diagnosis not present

## 2023-03-27 DIAGNOSIS — R001 Bradycardia, unspecified: Secondary | ICD-10-CM

## 2023-03-27 DIAGNOSIS — I4510 Unspecified right bundle-branch block: Secondary | ICD-10-CM | POA: Diagnosis not present

## 2023-03-27 DIAGNOSIS — I4892 Unspecified atrial flutter: Secondary | ICD-10-CM

## 2023-03-27 DIAGNOSIS — M6281 Muscle weakness (generalized): Secondary | ICD-10-CM | POA: Diagnosis not present

## 2023-03-27 DIAGNOSIS — I493 Ventricular premature depolarization: Secondary | ICD-10-CM | POA: Diagnosis not present

## 2023-03-27 DIAGNOSIS — F40298 Other specified phobia: Secondary | ICD-10-CM

## 2023-03-27 MED ORDER — DILTIAZEM HCL ER COATED BEADS 120 MG PO CP24: 120.0000 mg | ORAL_CAPSULE | Freq: Every day | ORAL | 3 refills | Status: DC

## 2023-03-27 NOTE — Patient Instructions (Signed)
 Medication Instructions:  Your physician has recommended you make the following change in your medication:   ** Begin Cardizem CD 120mg   - 1 capsule by mouth daily  *If you need a refill on your cardiac medications before your next appointment, please call your pharmacy*   Lab Work: None ordered.  If you have labs (blood work) drawn today and your tests are completely normal, you will receive your results only by: MyChart Message (if you have MyChart) OR A paper copy in the mail If you have any lab test that is abnormal or we need to change your treatment, we will call you to review the results.   Testing/Procedures: None ordered.    Follow-Up: At [facility], you and your health needs are our priority.  As part of our continuing mission to provide you with exceptional heart care, we have created designated Provider Care Teams.  These Care Teams include your primary Cardiologist (physician) and Advanced Practice Providers (APPs -  Physician Assistants and Nurse Practitioners) who all work together to provide you with the care you need, when you need it.  We recommend signing up for the patient portal called "MyChart".  Sign up information is provided on this After Visit Summary.  MyChart is used to connect with patients for Virtual Visits (Telemedicine).  Patients are able to view lab/test results, encounter notes, upcoming appointments, etc.  Non-urgent messages can be sent to your provider as well.   To learn more about what you can do with MyChart, go to [email or web address].    Your next appointment:   Appt with Dr Jimmey Ralph for repeat Afib ablation

## 2023-03-27 NOTE — Therapy (Signed)
 OUTPATIENT PHYSICAL THERAPY LOWER EXTREMITY TREATMENT/RECERT     Patient Name: Veronica Bradford MRN: [id number] DOB:[birth year], 78 y.o., female Today's Date: 03/27/2023  END OF SESSION:  PT End of Session - 03/27/23 1529     Visit Number 17    Number of Visits 19    Date for PT Re-Evaluation 04/24/23    Authorization Type [facility]    PT Start Time 1530    PT Stop Time 1615    PT Time Calculation (min) 45 min    Activity Tolerance Patient tolerated treatment well    Behavior During Therapy WFL for tasks assessed/performed                  Past Medical History:  Diagnosis Date   Atrial fibrillation (HCC)    Atrial fibrillation (HCC)    Back pain    CHF (congestive heart failure) (HCC)    grade 2 HF   Chronic sinusitis    s/p 2 surgeries remotely, Dr Ezzard Standing   Closed fracture of metatarsal of left foot    L foot, fifth metatarsal   Complication of anesthesia    woke up once during procedure   Dysrhythmia    a-fib   Ectopic pregnancy    Endometrial cancer (HCC) 2004   Early diagnosis   GERD (gastroesophageal reflux disease)    Hyperlipemia    Hypertension    IBS (irritable bowel syndrome) 08/13/2014   Dx 2015, diarrhea on-off, Dr Matthias Hughs    Lumbar radiculopathy 08/2012   MRI in 08/2012   Mild hyperparathyroidism (HCC)    Osteopenia    PAF (paroxysmal atrial fibrillation) (HCC)    Presence of permanent cardiac pacemaker    Sciatica of right side 08/2013   Received Depomedrol 80 mg injection   Sleep apnea 09/09/2013   Dx with OSA in 2015, by Dr. Graciela Husbands,   wears CPAPAPAP   Past Surgical History:  Procedure Laterality Date   ATRIAL FIBRILLATION ABLATION N/A 09/04/2017   Procedure: ATRIAL FIBRILLATION ABLATION;  Surgeon: Hillis Range, MD;  [facility]: [facility];  Service: Cardiovascular;  Laterality: N/A;   BIOPSY  03/11/2020   Procedure: BIOPSY;  Surgeon: Bernette Redbird, MD;  Location: [facility];  Service: Endoscopy;;  EGD and COLON   CARPAL  TUNNEL RELEASE Right 06/17/2019   CARPAL TUNNEL RELEASE Left 11/2020   CHOLECYSTECTOMY  ~2006   COLONOSCOPY WITH PROPOFOL N/A 03/11/2020   Procedure: COLONOSCOPY WITH PROPOFOL;  Surgeon: Bernette Redbird, MD;  Location: [facility];  Service: Endoscopy;  Laterality: N/A;   DILATION AND CURETTAGE OF UTERUS     after SAB   ESOPHAGOGASTRODUODENOSCOPY  02/09/2014   Dr. Matthias Hughs   ESOPHAGOGASTRODUODENOSCOPY (EGD) WITH PROPOFOL N/A 03/11/2020   Procedure: ESOPHAGOGASTRODUODENOSCOPY (EGD) WITH PROPOFOL;  Surgeon: Bernette Redbird, MD;  Location: [facility];  Service: Endoscopy;  Laterality: N/A;   FINGER SURGERY Left    index   NASAL SINUS SURGERY     x 2 remotely, Dr Ezzard Standing   PACEMAKER IMPLANT N/A 09/13/2020   Procedure: PACEMAKER IMPLANT;  Surgeon: Duke Salvia, MD;  Location: [facility] INVASIVE CV LAB;  Service: Cardiovascular;  Laterality: N/A;   PARATHYROIDECTOMY Left 09/14/2022   Procedure: LEFT PARATHYROIDECTOMY;  Surgeon: Darnell Level, MD;  Location: [facility];  Service: General;  Laterality: Left;   TONSILLECTOMY     TOTAL ABDOMINAL HYSTERECTOMY W/ BILATERAL SALPINGOOPHORECTOMY     TOTAL HIP ARTHROPLASTY Left 07/18/2021   Procedure: TOTAL HIP ARTHROPLASTY ANTERIOR APPROACH;  Surgeon: Samson Frederic, MD;  Location: [facility];  Service: Orthopedics;  Laterality: Left;   TOTAL KNEE ARTHROPLASTY Right ~2008   Patient Active Problem List   Diagnosis Date Noted   Class 2 obesity 11/29/2022   Cellulitis of left lower extremity 11/29/2022   Hyperparathyroidism, primary (HCC) 09/03/2022   DOE (dyspnea on exertion) 05/04/2022   Paroxysmal A-fib (HCC) 01/03/2022   Atrial fibrillation and flutter (HCC) - Persistent 08/16/2021   Closed left hip fracture, initial encounter (HCC) 07/14/2021   Chronic diastolic CHF (congestive heart failure) (HCC) 07/14/2021   Pacemaker 12/14/2020   RBBB 08/09/2020   PVC's (premature ventricular contractions) 08/09/2020   Sinus bradycardia - junctional rhythm  08/09/2020   Carpal tunnel syndrome, right 07/08/2019   Vitamin D deficiency 01/20/2019   Macular pucker, right eye 11/18/2018   Multiple pulmonary nodules determined by computed tomography of lung 12/06/2017   Paroxysmal atrial fibrillation (HCC) 09/04/2017   Osteopenia 05/23/2017   Annual physical exam 04/14/2015   PCP NOTES >>>>> 12/23/2014   IBS (irritable bowel syndrome) 08/13/2014   Hyperglycemia    Chronic sinusitis    Endometrial cancer (HCC)    Back pain-- on tylenol #3 prn    GERD (gastroesophageal reflux disease)    HTN (hypertension) 09/09/2013   Diastolic dysfunction-grade 2 09/09/2013   Chronic anticoagulation 09/09/2013   Edema of both legs 09/09/2013   Dyslipidemia 09/09/2013   Obesity (BMI 30-39.9) 09/09/2013   OSA on CPAP 09/09/2013    PCP: Willow Ora  REFERRING PROVIDER: Trudee Grip  REFERRING DIAG: Gait disturbances and LE weakness  THERAPY DIAG:  Other abnormalities of gait and mobility  Fear of falling  Balance disorder  Muscle weakness (generalized)  Rationale for Evaluation and Treatment: Rehabilitation  ONSET DATE: 10/06/22  SUBJECTIVE:   SUBJECTIVE STATEMENT:  Doing okay, feel like strength and balance need to be better  PERTINENT HISTORY: THA- 07/18/21 TKA 2008 Pacemaker 2022 Trochanteric bursitis OA L knee  A fib CHF   PAIN:  Are you having pain? No 0/10  PRECAUTIONS: None  RED FLAGS: None   WEIGHT BEARING RESTRICTIONS: No  FALLS:  Has patient fallen in last 6 months? No  LIVING ENVIRONMENT: Lives with: lives with their family Lives in: House/apartment Stairs: Yes: Internal: 14 steps; can reach both   OCCUPATION: Retired Engineer, civil (consulting)   PLOF: Independent  PATIENT GOALS: to feel more stable    OBJECTIVE:  Note: Objective measures were completed at Evaluation unless otherwise noted.  DIAGNOSTIC FINDINGS: N/A    COGNITION: Overall cognitive status: Within functional limits for tasks  assessed     SENSATION: WFL   MUSCLE LENGTH: Hamstrings: tightness BLE  POSTURE: rounded shoulders and forward head   LOWER EXTREMITY ROM: some hip flexor limitation due to weakness   LOWER EXTREMITY MMT: hip flexor 3+/5 and 4/5 generally; 03/12/23 R 4/5, L 3+/5   FUNCTIONAL TESTS:  5 times sit to stand: 21.15s from chair; 3/10- 17.8 seconds from chair  Timed up and go (TUG): 10.19s 3/10- DNT   Berg Balance Scale: 48/56; 3/10 54/56    TODAY'S TREATMENT:  DATE:  03/27/23 Nustep level 5 x 6 minutes Gait outside down hill and back up, a few rests  Leg curls 25# 2x10 Leg extension 5# 2 x10 Side stepping On airex ball toss On rocker board two ways reaching Red tband hip extension and abduction Red tband rows and extension  03/12/23  Objective measures for progress note/recert LEFS  Education on POC moving forward and progress towards goals- we will shift focus to strengthening and progress to likely DC to advanced HEP at end of that period   Nustep L6 x5 minutes BLEs only   03/05/23 Nustep level 5 x 5 minutes Leg curls 25# x10, 35# 2x10 Leg extension 5# 2x10 On rocker board 2 ways reaching for numbers On airex ball toss On airex cone toe touches Direction changes Walking ball toss Ball kicks Gait outside in the grass uneven terrain with CGA  02/27/23 Nustep level 5 x 6 minutes Gait outside 1/2 parking [place] negotiating curbs Cone toe touches solid surface and on airex On airex number reaching  25# HS curls x10, then left only 20# x10, then 15# x10 Partial sit ups LEg press 20# x10, 30# 2x10 2.5# MArches, hip abduction and extension Resisted gait all directions  02/12/23 Nustep level 5 x 6 minutes Leg curls 20# LEg extension 5# 10# straight arm pulls in airex On rockerboard 2 ways reaching for numbers on wall Walking ball  toss Direction changes Passive LE stretches Feet on ball K2C, trunk rotation, small bridge, isometric abs  02/05/23 Gait outside around the parking [place] CGA on curbs, one rest break On rocker board 2 ways reaching for numbers on wall On airex volleyball Ball kicks Leg curls 25# 2x10, left only 2x15 15# On airex 10# straight arm pulls and then alternating 5# hip extension 2x10 5# hip abduction 2x 5 Gait outside again in the grass, uneven terrain, slower and light CGA at times  01/29/23 Gait outside around the parking [place] 2 rest breaks 40# resisted gait all directions 20# leg curls On airex ball toss and volleyball Airex balance beam side stepping and tandem walking in pbars On rockerboard two ways reaching for numbers Walking ball toss 20# leg press , tried single legs but left knee pain, did SL without weight Direction changes  01/22/23 Bike level 3 x 3 minutes Side stepping on airex balance beam Feet on ball K2C, rotation, bridge and isometric abs Green tband clamshells Ball b/n knees squeeze Black tband supine hip extension On airex ball toss, volley ball and head turns  01/15/23 Nustep Level 5 x 6 minutes Passive stretch LE's Leg curls 20# 2x10 Calf stretches Tmill pushes 15 seconds x 3 On airex 5# straight arm pulls On Bosu balance and reaching  On airex reaching for numbers Step turn reach Resisted gait forward 1 lap Ball kicks On airex ball toss and volleyball Cone toe touches   PATIENT EDUCATION:  Education details: POC and HEP Person educated: Patient Education method: Explanation Education comprehension: verbalized understanding  HOME EXERCISE PROGRAM: Access Code: [email or web address] URL: [email or web address] Date: 10/31/2022 Prepared by: Cassie Freer  Exercises - Standing Tandem Balance with Counter Support  - 1 x daily - 7 x weekly - 2 reps - 30 hold - Single Leg Stance with Support  - 1 x daily - 7 x weekly - 2 reps - 10 hold - Sit to  Stand  - 1 x daily - 7 x weekly - 2 sets - 10 reps - Standing Marching  - 1 x daily -  7 x weekly - 2 sets - 10 reps  ASSESSMENT:  CLINICAL IMPRESSION:  Pt arrives today doing OK, per objective measures last main remaining impairment is really functional strength. We started the process of advanced HEP with tband hip and postural exercises, she reports having a device at home that she may bring next visit so we can look at it and see if it is appropriate or if we go with the T-band exercises. We will plan to briefly extend POC for about 4 more weeks with focus on strength and progression towards DC with finalized HEP.     OBJECTIVE IMPAIRMENTS: Abnormal gait, decreased activity tolerance, decreased balance, decreased coordination, difficulty walking, and decreased strength.   ACTIVITY LIMITATIONS: squatting, stairs, and locomotion level  REHAB POTENTIAL: Good  CLINICAL DECISION MAKING: Stable/uncomplicated  EVALUATION COMPLEXITY: Low  GOALS: Goals reviewed with patient? Yes  SHORT TERM GOALS: Target date: 12/12/22  Patient will be independent with initial HEP. Goal status: met 01/15/23  2.  Patient will demonstrate decreased 5xSTS from chair to <15s Baseline: 21.15s  Goal status: ONGOING  03/12/23  LONG TERM GOALS: Target date: 04/09/2023    Patient will be independent with advanced/ongoing HEP to improve outcomes and carryover.  Goal status: progressing 12/28/22  2.  Patient will be able to do SLS for 5-10s.  Baseline: 2s  Goal status: met 02/27/23  3.  Patient will be able to navigate stairs with reciprocal alternating pattern.  Baseline: does 1 steps at a time  Goal status: DEFERRED 03/12/23- step over step is still scary, still goes step to step  but this is her baseline and she reports due to knee more than hip   4.  Patient will demonstrate improved hip flexor strength to 5/5 Baseline: 3+/5 , 12/28/22 4-/5 bilaterally Goal status: ongoing 03/12/23  5.  Patient will  be able to do full tandem stance for 15s or better. Baseline: unable to get in position Goal status: met 03/12/23  6.  Patient will score 53 on Berg Balance test to demonstrate lower risk of falls.  Baseline: 48 Goal status:  03/12/23- 54/56   PLAN:  PT FREQUENCY: 2x/week  PT DURATION: 4 weeks  PLANNED INTERVENTIONS: 97110-Therapeutic exercises, 97530- Therapeutic activity, 97112- Neuromuscular re-education, 97535- Self Care, [address]- Manual therapy, [phone]- Gait training, Patient/Family education, Balance training, Stair training, Cryotherapy, and Moist heat  PLAN FOR NEXT SESSION: advance HEP, strength focus, work towards DC   Stacie Glaze, PT 03/27/23 3:35 PM

## 2023-03-27 NOTE — Progress Notes (Unsigned)
 Patient Care Team: Wanda Plump, MD as PCP - General (Internal Medicine) Duke Salvia, MD as PCP - Cardiology (Cardiology) Duke Salvia, MD as PCP - Electrophysiology (Cardiology) Duke Salvia, MD as Consulting Physician (Cardiology) Bernette Redbird, MD as Consulting Physician (Gastroenterology) [place], Heather, OD as Consulting Physician (Optometry) Stephannie Li, MD as Consulting Physician (Ophthalmology) Dominica Severin, MD as Consulting Physician (Orthopedic Surgery) Darnell Level, MD as Consulting Physician (General Surgery)   HPI  Veronica Bradford is a 78 y.o. 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  She does not need to get cardioverted however.  Atrial fibrillation seems to be associated in some way with her IBS which is associated with diarrhea.  She has treated sleep apnea with good reports.   Anticoagulation with Rivaroxaban; no  bleeding   1/24 >> Tikosyn has had intercurrent atrial fibrillation increasingly frequent episodes of atrial fibrillation that are relatively short but still quite discombobulated.  Associated with rapid rates.  Concomitant magnesium   The patient denies chest pain, nocturnal dyspnea, orthopnea or peripheral edema.  There have been no lightheadedness or syncope.  Complains of shortness of breath which is chronic and palpitations which are intermittent.  She is also thinking that she has in addition to her atrial fibrillation more frequent PACs..           Date Cr Hgb  K Mag  5/18 0.66 14.7 3.7 2.1  12/21 0.59 13.3 3.9 2.2    4/22 0.58 13.7 3.6   9/22 0.71     11/22 3.5 10.2    7/23 0.62 11.6 3.9   1/24 0.67 15.2 4.1   9/24 0.8 11.7  4.0   3/25 0.71 13.5 4.1 2.2 (11/24)      DATE TEST EF   9/15 Myoview  65 % No ischemia  8/17 Echo   60-65 %   8/21 Myoview 71% No ischemia  8/21 Echo  60-65% LAE-mod-severe  (42.5 ml/m2)  2/23 Echo  65-70%   5/24 CT chest  Aortic calcification   7/24 CTA  CaScore 128          Past Medical History:  Diagnosis Date   Atrial fibrillation (HCC)    Atrial fibrillation (HCC)    Back pain    CHF (congestive heart failure) (HCC)    grade 2 HF   Chronic sinusitis    s/p 2 surgeries remotely, Dr Ezzard Standing   Closed fracture of metatarsal of left foot    L foot, fifth metatarsal   Complication of anesthesia    woke up once during procedure   Dysrhythmia    a-fib   Ectopic pregnancy    Endometrial cancer (HCC) 2004   Early diagnosis   GERD (gastroesophageal reflux disease)    Hyperlipemia    Hypertension    IBS (irritable bowel syndrome) 08/13/2014   Dx 2015, diarrhea on-off, Dr Matthias Hughs    Lumbar radiculopathy 08/2012   MRI in 08/2012   Mild hyperparathyroidism (HCC)    Osteopenia    PAF (paroxysmal atrial fibrillation) (HCC)    Presence of permanent cardiac pacemaker    Sciatica of right side 08/2013   Received Depomedrol 80 mg injection   Sleep apnea 09/09/2013  Dx with OSA in 2015, by Dr. Graciela Husbands,   wears CPAPAPAP    Past Surgical History:  Procedure Laterality Date   ATRIAL FIBRILLATION ABLATION N/A 09/04/2017   Procedure: ATRIAL FIBRILLATION ABLATION;  Surgeon: Hillis Range, MD;  Location: [facility];  Service: Cardiovascular;  Laterality: N/A;   BIOPSY  03/11/2020   Procedure: BIOPSY;  Surgeon: Bernette Redbird, MD;  Location: [facility];  Service: Endoscopy;;  EGD and COLON   CARPAL TUNNEL RELEASE Right 06/17/2019   CARPAL TUNNEL RELEASE Left 11/2020   CHOLECYSTECTOMY  ~2006   COLONOSCOPY WITH PROPOFOL N/A 03/11/2020   Procedure: COLONOSCOPY WITH PROPOFOL;  Surgeon: Bernette Redbird, MD;  Location: [facility];  Service: Endoscopy;  Laterality: N/A;   DILATION AND CURETTAGE OF UTERUS     after  SAB   ESOPHAGOGASTRODUODENOSCOPY  02/09/2014   Dr. Matthias Hughs   ESOPHAGOGASTRODUODENOSCOPY (EGD) WITH PROPOFOL N/A 03/11/2020   Procedure: ESOPHAGOGASTRODUODENOSCOPY (EGD) WITH PROPOFOL;  Surgeon: Bernette Redbird, MD;  Location: [facility];  Service: Endoscopy;  Laterality: N/A;   FINGER SURGERY Left    index   NASAL SINUS SURGERY     x 2 remotely, Dr Ezzard Standing   PACEMAKER IMPLANT N/A 09/13/2020   Procedure: PACEMAKER IMPLANT;  Surgeon: Duke Salvia, MD;  Location: [facility] INVASIVE CV LAB;  Service: Cardiovascular;  Laterality: N/A;   PARATHYROIDECTOMY Left 09/14/2022   Procedure: LEFT PARATHYROIDECTOMY;  Surgeon: Darnell Level, MD;  Location: [facility];  Service: General;  Laterality: Left;   TONSILLECTOMY     TOTAL ABDOMINAL HYSTERECTOMY W/ BILATERAL SALPINGOOPHORECTOMY     TOTAL HIP ARTHROPLASTY Left 07/18/2021   Procedure: TOTAL HIP ARTHROPLASTY ANTERIOR APPROACH;  Surgeon: Samson Frederic, MD;  Location: [facility];  Service: Orthopedics;  Laterality: Left;   TOTAL KNEE ARTHROPLASTY Right ~2008    Current Outpatient Medications  Medication Sig Dispense Refill   acetaminophen (TYLENOL) 500 MG tablet Take 500 mg by mouth every 6 (six) hours as needed for moderate pain or headache.     acetaminophen-codeine (TYLENOL #3) 300-30 MG tablet Take 1 tablet by mouth at bedtime as needed for moderate pain (pain score 4-6). 30 tablet 0   ARTIFICIAL TEAR SOLUTION OP Place 1 drop into both eyes 4 (four) times daily as needed (dry/irritated eyes.).     Chromium Picolinate 500 MCG TABS Take 500 mcg by mouth daily after supper.     Coenzyme Q10 (CO Q 10) 100 MG CAPS Take 100 mg by mouth daily.     colestipol (COLESTID) 1 g tablet Take 2 g by mouth 2 (two) times daily.     CRANBERRY EXTRACT PO Take 2 tablets by mouth at bedtime.     diltiazem (CARDIZEM) 30 MG tablet Take 1 tablet every 4 hours AS NEEDED for heart rate >100 as long as blood pressure >100. 120 tablet 11   diphenoxylate-atropine (LOMOTIL) 2.5-0.025  MG tablet Take 1 tablet by mouth 4 (four) times daily as needed.     dofetilide (TIKOSYN) 250 MCG capsule TAKE 1 CAPSULE BY MOUTH 2 TIMES DAILY. 180 capsule 2   estradiol (ESTRACE) 0.1 MG/GM vaginal cream Place 1 Applicatorful vaginally 3 (three) times a week. 42.5 g 12   ferrous fumarate (HEMOCYTE - 106 MG FE) 325 (106 Fe) MG TABS tablet Take 1 tablet (106 mg of iron total) by mouth 2 (two) times daily. Take apart from pantoprazole 180 tablet 1   fluticasone (FLONASE) 50 MCG/ACT nasal spray Place 1 spray into both nostrils daily.     folic  acid (FOLVITE) 800 MCG tablet Take 800 mcg by mouth in the morning.     furosemide (LASIX) 40 MG tablet Take 1 tablet (40 mg total) by mouth as needed. 30 tablet 11   MAGNESIUM GLYCINATE PO Take 740 mg by mouth daily.     olopatadine (PATANOL) 0.1 % ophthalmic solution Place 1 drop into both eyes daily as needed (allergies).     pantoprazole (PROTONIX) 40 MG tablet Take 1 tablet (40 mg total) by mouth 2 (two) times daily. (Patient taking differently: Take 40 mg by mouth daily before supper.)     potassium chloride SA (KLOR-CON M) 20 MEQ tablet TAKE 1 TABLET(20 MEQ) BY MOUTH DAILY 90 tablet 2   pravastatin (PRAVACHOL) 40 MG tablet Take 1 tablet (40 mg total) by mouth every evening. 90 tablet 3   rivaroxaban (XARELTO) 20 MG TABS tablet TAKE 1 TABLET(20 MG) BY MOUTH DAILY WITH SUPPER 90 tablet 1   spironolactone (ALDACTONE) 25 MG tablet TAKE 1 TABLET(25 MG) BY MOUTH DAILY (Patient taking differently: Take 25 mg by mouth daily.) 90 tablet 3   sucralfate (CARAFATE) 1 g tablet Take 1 g by mouth 2 (two) times daily.     valsartan (DIOVAN) 40 MG tablet Take 1 tablet (40 mg total) by mouth daily. (Patient taking differently: Take 10 mg by mouth at bedtime.) 90 tablet 3   No current facility-administered medications for this visit.    Allergies  Allergen Reactions   Ibuprofen Other (See Comments)    Pt is on Xarelto   Lactose Intolerance (Gi) Diarrhea and Nausea And  Vomiting   Lisinopril Cough   Nsaids Other (See Comments)    Pt on Xarelto   Tape     Causes blisters    Tolmetin Other (See Comments)    Pt on Xarelto   Zoster Vaccine Live Other (See Comments)    Serum sickness, high fever, neck pain    Amoxicillin Rash   Penicillins Rash    Local reaction    Review of Systems negative except from HPI and PMH (+) Shortness of Breath (+) Lightheadedness (+) LE Edema  Physical Exam: BP 128/66   Pulse 68   Ht 5' 7.5" (1.715 m)   Wt 251 lb 3.2 oz (113.9 kg)   SpO2 98%   BMI 38.76 kg/m   Well developed and Morbidly obese  in no acute distress HENT normal Neck supple with JVP-flat Clear Device pocket well healed; without hematoma or erythema.  There is no tethering  Regular rate and rhythm, no  gallop No  murmur Abd-soft with active BS No Clubbing cyanosis  edema Skin-warm and dry A & Oriented  Grossly normal sensory and motor function  ECG atrial pacing at 68 Intervals 20/15/43 Right bundle branch block Question septal MI  Device function is normal. Programming changes none  See Paceart for details      Assessment and  Plan  Atrial fibrillation-flutter persistent  CHADS-VASc score 2 (age/gender)    Obesity  Dyspnea on exertion/HFpEF  RBBB/left axis deviation  PVCs  Sinus bradycardia/junctional rhythm  Increasing problems with atrial fibrillation despite Tikosyn and magnesium.  Increasing palpitations which she thinks may be PACs.  We discussed options including alternative entry of the drugs as well as repeat catheter ablation.  She recalls that her first experience of was rather horrendous with significant postprocedural chest pain and increased burden of PVCs.  Still she would like to have this discussion.  We will arrange.  With her PACs being  increasingly frequent and the atrial fibrillation burden increasing, we will change her Cardizem from 30 as needed to 120 daily.  Continue her rivaroxaban.  Continue  spironolactone potassium hemostasis is better.

## 2023-03-28 ENCOUNTER — Ambulatory Visit (HOSPITAL_BASED_OUTPATIENT_CLINIC_OR_DEPARTMENT_OTHER)
Admission: RE | Admit: 2023-03-28 | Discharge: 2023-03-28 | Disposition: A | Discharge status: Home / Self Care | Source: Ambulatory Visit | Attending: Family Medicine | Admitting: Family Medicine

## 2023-03-28 DIAGNOSIS — K429 Umbilical hernia without obstruction or gangrene: Secondary | ICD-10-CM | POA: Diagnosis not present

## 2023-03-28 DIAGNOSIS — R1032 Left lower quadrant pain: Secondary | ICD-10-CM | POA: Diagnosis not present

## 2023-03-28 DIAGNOSIS — R1030 Lower abdominal pain, unspecified: Secondary | ICD-10-CM | POA: Insufficient documentation

## 2023-03-28 DIAGNOSIS — K5730 Diverticulosis of large intestine without perforation or abscess without bleeding: Secondary | ICD-10-CM | POA: Diagnosis not present

## 2023-03-28 MED ORDER — IOHEXOL 300 MG/ML  SOLN
125.0000 mL | Freq: Once | INTRAMUSCULAR | Status: AC | PRN
  Administered 2023-03-28: 125 mL via INTRAVENOUS

## 2023-03-29 ENCOUNTER — Ambulatory Visit: Admitting: Physical Therapy

## 2023-03-30 ENCOUNTER — Encounter: Payer: Self-pay | Admitting: Family Medicine

## 2023-04-01 ENCOUNTER — Encounter: Payer: Self-pay | Admitting: Family Medicine

## 2023-04-02 ENCOUNTER — Ambulatory Visit: Attending: Cardiology | Admitting: Cardiology

## 2023-04-02 ENCOUNTER — Encounter: Payer: Self-pay | Admitting: Cardiology

## 2023-04-02 VITALS — BP 124/70 | HR 64 | Resp 16 | Ht 67.5 in | Wt 253.8 lb

## 2023-04-02 DIAGNOSIS — I10 Essential (primary) hypertension: Secondary | ICD-10-CM

## 2023-04-02 DIAGNOSIS — Z950 Presence of cardiac pacemaker: Secondary | ICD-10-CM

## 2023-04-02 DIAGNOSIS — I480 Paroxysmal atrial fibrillation: Secondary | ICD-10-CM

## 2023-04-02 DIAGNOSIS — D6869 Other thrombophilia: Secondary | ICD-10-CM | POA: Diagnosis not present

## 2023-04-02 DIAGNOSIS — I495 Sick sinus syndrome: Secondary | ICD-10-CM | POA: Diagnosis not present

## 2023-04-02 NOTE — Progress Notes (Signed)
 Electrophysiology Office Note:   Date:  04/02/2023  ID:  JAQUESHA BOROFF, DOB [birth year], MRN [id number]  Primary Cardiologist: Sherryl Manges, MD Electrophysiologist: Sherryl Manges, MD      History of Present Illness:   [place] DOST is a 78 y.o. 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 Graciela Husbands.  Discussed the use of AI scribe software for clinical note transcription with the patient, who gave verbal consent to proceed.  History of Present Illness JODELLE FAUSTO is a 78 year old female with atrial fibrillation who presents for evaluation of recurrent episodes despite medication. She was referred by Dr. Graciela Husbands for evaluation of her atrial fibrillation.  The patient has a history of atrial fibrillation and is currently on dofetilide. Despite this medication, she continues to experience episodes of atrial fibrillation approximately every three to four weeks, sometimes more frequently. Symptoms include pulsatile tinnitus and abrupt changes in heart rate, often waking her at night. She uses a Kardia mobile device and a watch to monitor her pulse, noting irregularities that coincide with her symptoms. Episodes predominantly occur at night, between 3 and 7 AM, but can occasionally happen during the day. These episodes significantly impact her daily life, lasting up to nine hours and leaving her fatigued and unable to perform daily activities. Prior to starting dofetilide, episodes lasted two to three days.  In 2019, she underwent an ablation procedure, resulting in a difficult recovery. She experienced rapid atrial fibrillation post-surgery, chest burning for six weeks, and new arrhythmias. During a follow-up visit, she was informed of atrial flutter. She describes the recovery as 'rough,' with significant discomfort  and prolonged symptoms.    Review of systems complete and found to be negative unless listed in HPI.   EP Information / Studies Reviewed:    EKG is not ordered today. EKG from 03/27/23 reviewed which showed atrial paced rhythm.      Echo 02/11/2021: 1. Left ventricular ejection fraction, by estimation, is 65 to 70%. The  left ventricle has normal function. The left ventricle has no regional  wall motion abnormalities. There is moderate asymmetric left ventricular  hypertrophy of the basal-septal  segment. Left ventricular diastolic parameters are indeterminate.   2. Right ventricular systolic function is normal. The right ventricular  size is mildly enlarged. There is normal pulmonary artery systolic  pressure. The estimated right ventricular systolic pressure is 30.2 mmHg.   3. The mitral valve is normal in structure. Trivial mitral valve  regurgitation. No evidence of mitral stenosis.   4. The aortic valve is tricuspid. Aortic valve regurgitation is not  visualized. No aortic stenosis is present.   5. The inferior vena cava is normal in size with greater than 50%  respiratory variability, suggesting right atrial pressure of 3 mmHg.   Risk Assessment/Calculations:    CHA2DS2-VASc Score = 5   This indicates a 7.2% annual risk of stroke. The patient's score is based upon: CHF History: 1 HTN History: 1 Diabetes History: 0 Stroke History: 0 Vascular Disease History: 1 Age Score: 1 Gender Score: 1             Physical Exam:   VS:  BP 124/70 (BP Location: Left Arm, Patient Position: Sitting, Cuff Size: Normal)   Pulse 64   Resp 16   Ht 5' 7.5" (1.715 m)   Wt  253 lb 12.8 oz (115.1 kg)   SpO2 98%   BMI 39.16 kg/m    Wt Readings from Last 3 Encounters:  04/02/23 253 lb 12.8 oz (115.1 kg)  03/27/23 251 lb 3.2 oz (113.9 kg)  03/22/23 254 lb 3.2 oz (115.3 kg)     GEN: Well nourished, well developed in no acute distress NECK: No JVD; No carotid bruits CARDIAC: Normal  rate, regular RESPIRATORY:  Clear to auscultation without rales, wheezing or rhonchi  ABDOMEN: Soft, non-tender, non-distended EXTREMITIES: 1+ edema; No deformity   ASSESSMENT AND PLAN:    Assessment & Plan # Paroxysmal Atrial Fibrillation: Status post PVI in 2019.  Posterior wall was not ablated.  She continues to have symptomatic episodes despite Tikosyn. # Typical atrial flutter: Status post CTI ablation in 2019. -Discussed treatment options today for AF including antiarrhythmic drug therapy and ablation. Discussed risks, recovery and likelihood of success with each treatment strategy. Risk, benefits, and alternatives to EP study and ablation for afib were discussed. These risks include but are not limited to stroke, bleeding, vascular damage, tamponade, perforation, damage to the esophagus, lungs, phrenic nerve and other structures, pulmonary vein stenosis, worsening renal function, coronary vasospasm and death.  Discussed potential need for repeat ablation procedures and antiarrhythmic drugs after an initial ablation. The patient understands these risk and wishes to proceed.  We will therefore proceed with catheter ablation at the next available time.  Carto, ICE, anesthesia are requested for the procedure.  Will also obtain CT PV protocol prior to the procedure to exclude LAA thrombus and further evaluate atrial anatomy. -Continue dofetilide 250 mcg twice daily. -Continue diltiazem 120 mg once daily.  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 -Continue Xarelto 20 mg once daily.  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  #Hypertension -At  goal today.  Recommend checking blood pressures 1-2 times per week at home and recording the values.  Recommend bringing these recordings to the primary care physician.  Follow up with Dr. Jimmey Ralph 3 months after ablation.  Signed, Nobie Putnam, MD

## 2023-04-02 NOTE — Patient Instructions (Addendum)
 Medication Instructions:  Your physician recommends that you continue on your current medications as directed. Please refer to the Current Medication list given to you today.  *If you need a refill on your cardiac medications before your next appointment, please call your pharmacy*  Testing/Procedures: Ablation Your physician has recommended that you have an ablation. Catheter ablation is a medical procedure used to treat some cardiac arrhythmias (irregular heartbeats). During catheter ablation, a long, thin, flexible tube is put into a blood vessel in your groin (upper thigh), or neck. This tube is called an ablation catheter. It is then guided to your heart through the blood vessel. Radio frequency waves destroy small areas of heart tissue where abnormal heartbeats may cause an arrhythmia to start. Please see the instruction sheet given to you today.  Follow-Up: At [facility], you and your health needs are our priority.  As part of our continuing mission to provide you with exceptional heart care, our providers are all part of one team.  This team includes your primary Cardiologist (physician) and Advanced Practice Providers or APPs (Physician Assistants and Nurse Practitioners) who all work together to provide you with the care you need, when you need it.  Your next appointment:   Call us if you decide to schedule an ablation  Available dates: June 2nd, June 4th, June 11th, and June 13th     1st Floor: - Lobby - Registration  - Pharmacy  - Lab - Cafe  2nd Floor: - PV Lab - Diagnostic Testing (echo, CT, nuclear med)  3rd Floor: - Vacant  4th Floor: - TCTS (cardiothoracic surgery) - AFib Clinic - Structural Heart Clinic - Vascular Surgery  - Vascular Ultrasound  5th Floor: - [facility] Card[facility] (general and EP) - Clinical Pharmacy for coumadin, hypertension, lipid, weight-loss medications, and med management appointments    Valet parking services will be  available as well.

## 2023-04-03 LAB — CUP PACEART INCLINIC DEVICE CHECK
Battery Remaining Longevity: 93 mo
Battery Voltage: 3.01 V
Brady Statistic RA Percent Paced: 88 %
Brady Statistic RV Percent Paced: 0.52 %
Date Time Interrogation Session: 20250325105000
Implantable Lead Connection Status: 753985
Implantable Lead Connection Status: 753985
Implantable Lead Implant Date: 20220912
Implantable Lead Implant Date: 20220912
Implantable Lead Location: 753859
Implantable Lead Location: 753860
Implantable Lead Model: 3830
Implantable Pulse Generator Implant Date: 20220912
Lead Channel Impedance Value: 387.5 Ohm
Lead Channel Impedance Value: 587.5 Ohm
Lead Channel Pacing Threshold Amplitude: 0.75 V
Lead Channel Pacing Threshold Amplitude: 0.75 V
Lead Channel Pacing Threshold Amplitude: 1 V
Lead Channel Pacing Threshold Amplitude: 1 V
Lead Channel Pacing Threshold Pulse Width: 0.4 ms
Lead Channel Pacing Threshold Pulse Width: 0.4 ms
Lead Channel Pacing Threshold Pulse Width: 0.4 ms
Lead Channel Pacing Threshold Pulse Width: 0.4 ms
Lead Channel Sensing Intrinsic Amplitude: 12 mV
Lead Channel Sensing Intrinsic Amplitude: 2 mV
Lead Channel Setting Pacing Amplitude: 2 V
Lead Channel Setting Pacing Amplitude: 2.5 V
Lead Channel Setting Pacing Pulse Width: 0.4 ms
Lead Channel Setting Sensing Sensitivity: 2 mV
Pulse Gen Model: 2272
Pulse Gen Serial Number: 3956585

## 2023-04-04 ENCOUNTER — Ambulatory Visit: Admitting: Physical Therapy

## 2023-04-05 DIAGNOSIS — M79672 Pain in left foot: Secondary | ICD-10-CM | POA: Diagnosis not present

## 2023-04-05 DIAGNOSIS — S92515A Nondisplaced fracture of proximal phalanx of left lesser toe(s), initial encounter for closed fracture: Secondary | ICD-10-CM | POA: Diagnosis not present

## 2023-04-09 ENCOUNTER — Other Ambulatory Visit: Payer: Self-pay | Admitting: Internal Medicine

## 2023-04-10 DIAGNOSIS — L03116 Cellulitis of left lower limb: Secondary | ICD-10-CM | POA: Diagnosis not present

## 2023-04-10 DIAGNOSIS — S92515A Nondisplaced fracture of proximal phalanx of left lesser toe(s), initial encounter for closed fracture: Secondary | ICD-10-CM | POA: Diagnosis not present

## 2023-04-12 ENCOUNTER — Ambulatory Visit: Admitting: Physical Therapy

## 2023-04-16 ENCOUNTER — Encounter: Payer: Self-pay | Admitting: Internal Medicine

## 2023-04-17 ENCOUNTER — Encounter (HOSPITAL_BASED_OUTPATIENT_CLINIC_OR_DEPARTMENT_OTHER): Payer: Self-pay

## 2023-04-18 DIAGNOSIS — S92515A Nondisplaced fracture of proximal phalanx of left lesser toe(s), initial encounter for closed fracture: Secondary | ICD-10-CM | POA: Diagnosis not present

## 2023-04-19 ENCOUNTER — Other Ambulatory Visit: Payer: Self-pay | Admitting: Internal Medicine

## 2023-04-26 DIAGNOSIS — C441191 Basal cell carcinoma of skin of left upper eyelid, including canthus: Secondary | ICD-10-CM | POA: Diagnosis not present

## 2023-05-01 NOTE — Progress Notes (Signed)
 Remote pacemaker transmission.

## 2023-05-01 NOTE — Addendum Note (Signed)
 Addended by: Lott Rouleau A on: 05/01/2023 11:06 AM   Modules accepted: Orders

## 2023-05-18 DIAGNOSIS — S92515A Nondisplaced fracture of proximal phalanx of left lesser toe(s), initial encounter for closed fracture: Secondary | ICD-10-CM | POA: Diagnosis not present

## 2023-05-23 ENCOUNTER — Ambulatory Visit (HOSPITAL_BASED_OUTPATIENT_CLINIC_OR_DEPARTMENT_OTHER): Admitting: Pulmonary Disease

## 2023-06-01 ENCOUNTER — Telehealth: Payer: Self-pay

## 2023-06-01 DIAGNOSIS — I480 Paroxysmal atrial fibrillation: Secondary | ICD-10-CM

## 2023-06-01 NOTE — Telephone Encounter (Signed)
 Pt is scheduled for Afib Ablation with Dr. Daneil Dunker on 7/30 at 8:30 am.   She is scheduled on 7/7 at 11:30 am for her CT and will have her labs done same day.   Instruction letters will be sent via MyChart once completed.

## 2023-06-07 DIAGNOSIS — R300 Dysuria: Secondary | ICD-10-CM | POA: Diagnosis not present

## 2023-06-12 ENCOUNTER — Telehealth: Payer: Self-pay | Admitting: Internal Medicine

## 2023-06-12 ENCOUNTER — Ambulatory Visit (INDEPENDENT_AMBULATORY_CARE_PROVIDER_SITE_OTHER): Payer: Medicare Other

## 2023-06-12 DIAGNOSIS — R001 Bradycardia, unspecified: Secondary | ICD-10-CM

## 2023-06-12 NOTE — Telephone Encounter (Signed)
 Please assist pt.

## 2023-06-12 NOTE — Telephone Encounter (Signed)
 Patient would like to know the total out of pocket cost for the upcoming ablation will be. Patient would like to know the amount they will be billed for once the procedure is authorized and the insurance paid their portion. Please advise.

## 2023-06-13 LAB — CUP PACEART REMOTE DEVICE CHECK
Battery Remaining Longevity: 86 mo
Battery Remaining Percentage: 75 %
Battery Voltage: 3.01 V
Brady Statistic AP VP Percent: 5 %
Brady Statistic AP VS Percent: 89 %
Brady Statistic AS VP Percent: 1 %
Brady Statistic AS VS Percent: 5.2 %
Brady Statistic RA Percent Paced: 93 %
Brady Statistic RV Percent Paced: 5.5 %
Date Time Interrogation Session: 20250610023610
Implantable Lead Connection Status: 753985
Implantable Lead Connection Status: 753985
Implantable Lead Implant Date: 20220912
Implantable Lead Implant Date: 20220912
Implantable Lead Location: 753859
Implantable Lead Location: 753860
Implantable Lead Model: 3830
Implantable Pulse Generator Implant Date: 20220912
Lead Channel Impedance Value: 360 Ohm
Lead Channel Impedance Value: 590 Ohm
Lead Channel Pacing Threshold Amplitude: 0.75 V
Lead Channel Pacing Threshold Amplitude: 1 V
Lead Channel Pacing Threshold Pulse Width: 0.4 ms
Lead Channel Pacing Threshold Pulse Width: 0.4 ms
Lead Channel Sensing Intrinsic Amplitude: 0.8 mV
Lead Channel Sensing Intrinsic Amplitude: 12 mV
Lead Channel Setting Pacing Amplitude: 2 V
Lead Channel Setting Pacing Amplitude: 2.5 V
Lead Channel Setting Pacing Pulse Width: 0.4 ms
Lead Channel Setting Sensing Sensitivity: 2 mV
Pulse Gen Model: 2272
Pulse Gen Serial Number: 3956585

## 2023-06-27 ENCOUNTER — Ambulatory Visit: Payer: Medicare Other | Admitting: *Deleted

## 2023-06-27 VITALS — Ht 67.5 in | Wt 250.0 lb

## 2023-06-27 DIAGNOSIS — Z0000 Encounter for general adult medical examination without abnormal findings: Secondary | ICD-10-CM

## 2023-06-27 NOTE — Progress Notes (Signed)
 Subjective:   Veronica Bradford is a 78 y.o. who presents for a Medicare Wellness preventive visit.  As a reminder, Annual Wellness Visits don't include a physical exam, and some assessments may be limited, especially if this visit is performed virtually. We may recommend an in-person follow-up visit with your provider if needed.  Visit Complete: Virtual I connected with  Veronica Bradford on 06/27/23 by a audio enabled telemedicine application and verified that I am speaking with the correct person using two identifiers.  Patient Location: Home  Provider Location: Office/Clinic  I discussed the limitations of evaluation and management by telemedicine. The patient expressed understanding and agreed to proceed.  Vital Signs: Because this visit was a virtual/telehealth visit, some criteria may be missing or patient reported. Any vitals not documented were not able to be obtained and vitals that have been documented are patient reported.  VideoDeclined- This patient declined Librarian, academic. Therefore the visit was completed with audio only.  Persons Participating in Visit: Patient.  AWV Questionnaire: No: Patient Medicare AWV questionnaire was not completed prior to this visit.  Cardiac Risk Factors include: advanced age (>64men, >44 women);dyslipidemia;hypertension;obesity (BMI >30kg/m2);Other (see comment), Risk factor comments: OSA, A-fib, CHF, hx of endometrial cancer     Objective:    Today's Vitals   06/27/23 1341  Weight: 250 lb (113.4 kg)  Height: 5' 7.5 (1.715 m)   Body mass index is 38.58 kg/m.     06/27/2023    2:07 PM 11/29/2022    5:21 PM 11/29/2022   12:06 PM 11/22/2022    8:20 PM 09/06/2022    1:48 PM 07/24/2022   11:05 AM 01/03/2022    4:39 PM  Advanced Directives  Does Patient Have a Medical Advance Directive? Yes  No No Yes Yes Yes  Type of Estate agent of [place];Living will    Healthcare Power of  [place];Living will Healthcare Power of [place];Living will Healthcare Power of [place];Living will  Does patient want to make changes to medical advance directive? No - Patient declined    No - Patient declined No - Patient declined No - Patient declined  Copy of Healthcare Power of Attorney in Chart? Yes - validated most recent copy scanned in chart (See row information)    No - copy requested No - copy requested No - copy requested  Would patient like information on creating a medical advance directive?  No - Patient declined  No - Patient declined       Current Medications (verified) Outpatient Encounter Medications as of 06/27/2023  Medication Sig   acetaminophen  (TYLENOL ) 500 MG tablet Take 500 mg by mouth every 6 (six) hours as needed for moderate pain or headache.   acetaminophen -codeine  (TYLENOL  #3) 300-30 MG tablet Take 1 tablet by mouth at bedtime as needed for moderate pain (pain score 4-6).   ARTIFICIAL TEAR SOLUTION OP Place 1 drop into both eyes 4 (four) times daily as needed (dry/irritated eyes.).   B Complex-C (SUPER B COMPLEX/VITAMIN C PO) Take 1 tablet by mouth daily.   Chromium Picolinate 500 MCG TABS Take 500 mcg by mouth daily after supper.   Coenzyme Q10 (CO Q 10) 100 MG CAPS Take 100 mg by mouth daily.   colestipol (COLESTID) 1 g tablet Take 2 g by mouth 2 (two) times daily.   CRANBERRY EXTRACT PO Take 2 tablets by mouth at bedtime.   diltiazem  (CARDIZEM ) 30 MG tablet Take 1 tablet every 4 hours AS NEEDED  for heart rate >100 as long as blood pressure >100.   diphenoxylate-atropine (LOMOTIL) 2.5-0.025 MG tablet Take 1 tablet by mouth 4 (four) times daily as needed.   dofetilide  (TIKOSYN ) 250 MCG capsule TAKE 1 CAPSULE BY MOUTH 2 TIMES DAILY.   estradiol  (ESTRACE ) 0.1 MG/GM vaginal cream Place 1 Applicatorful vaginally 3 (three) times a week.   ferrous fumarate  (HEMOCYTE - 106 MG FE) 325 (106 Fe) MG TABS tablet Take 1 tablet (106 mg of iron total) by mouth 2 (two) times  daily. Take apart from pantoprazole    fluticasone  (FLONASE ) 50 MCG/ACT nasal spray Place 1 spray into both nostrils daily.   furosemide  (LASIX ) 40 MG tablet TAKE 1 TABLET BY MOUTH AS NEEDED   MAGNESIUM  GLYCINATE PO Take 740 mg by mouth daily.   olopatadine  (PATANOL) 0.1 % ophthalmic solution Place 1 drop into both eyes daily as needed (allergies).   pantoprazole  (PROTONIX ) 40 MG tablet Take 1 tablet (40 mg total) by mouth 2 (two) times daily. (Patient taking differently: Take 40 mg by mouth daily before supper.)   potassium chloride  SA (KLOR-CON  M) 20 MEQ tablet TAKE 1 TABLET(20 MEQ) BY MOUTH DAILY   pravastatin  (PRAVACHOL ) 40 MG tablet Take 1 tablet (40 mg total) by mouth every evening.   rivaroxaban  (XARELTO ) 20 MG TABS tablet TAKE 1 TABLET(20 MG) BY MOUTH DAILY WITH SUPPER   spironolactone  (ALDACTONE ) 25 MG tablet TAKE 1 TABLET(25 MG) BY MOUTH DAILY   sucralfate  (CARAFATE ) 1 g tablet Take 1 g by mouth 2 (two) times daily as needed.   valsartan  (DIOVAN ) 40 MG tablet Take 1 tablet (40 mg total) by mouth daily. (Patient taking differently: Take 10 mg by mouth at bedtime.)   diltiazem  (CARDIZEM  CD) 120 MG 24 hr capsule Take 1 capsule (120 mg total) by mouth daily. (Patient not taking: Reported on 06/27/2023)   [DISCONTINUED] folic acid  (FOLVITE ) 800 MCG tablet Take 800 mcg by mouth in the morning.   No facility-administered encounter medications on file as of 06/27/2023.    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   Chronic sinusitis    s/p 2 surgeries remotely, Dr Ethyl   Closed fracture of metatarsal of left foot    L foot, fifth metatarsal   Complication of anesthesia    woke up once during procedure    Dysrhythmia    a-fib   Ectopic pregnancy    Endometrial cancer (HCC) 2004   Early diagnosis   GERD (gastroesophageal reflux disease)    Hyperlipemia    Hypertension    IBS (irritable bowel syndrome) 08/13/2014   Dx 2015, diarrhea on-off, Dr Donnald    Lumbar radiculopathy 08/2012   MRI in 08/2012   Mild hyperparathyroidism (HCC)    Osteopenia    PAF (paroxysmal atrial fibrillation) (HCC)    Presence of permanent cardiac pacemaker    Sciatica of right side 08/2013   Received Depomedrol 80 mg injection   Sleep apnea 09/09/2013   Dx with OSA in 2015, by Dr. Fernande,   wears CPAPAPAP   Past Surgical History:  Procedure Laterality Date   ATRIAL FIBRILLATION ABLATION N/A 09/04/2017   Procedure: ATRIAL FIBRILLATION ABLATION;  Surgeon: Kelsie Agent, MD;  Location: [facility]ASIVE [facility];  Service: Cardiovascular;  Laterality: N/A;   BIOPSY  03/11/2020   Procedure: BIOPSY;  Surgeon: Donnald Charleston, MD;  Location: [facility];  Service: Endoscopy;;  EGD and COLON   CARPAL TUNNEL RELEASE Right 06/17/2019   CARPAL TUNNEL RELEASE Left 11/2020   CHOLECYSTECTOMY  ~2006   COLONOSCOPY WITH PROPOFOL  N/A 03/11/2020   Procedure: COLONOSCOPY WITH PROPOFOL ;  Surgeon: Donnald Charleston, MD;  Location: [facility];  Service: Endoscopy;  Laterality: N/A;   DILATION AND CURETTAGE OF UTERUS     after SAB   ESOPHAGOGASTRODUODENOSCOPY  02/09/2014   Dr. Donnald   ESOPHAGOGASTRODUODENOSCOPY (EGD) WITH PROPOFOL  N/A 03/11/2020   Procedure: ESOPHAGOGASTRODUODENOSCOPY (EGD) WITH PROPOFOL ;  Surgeon: Donnald Charleston, MD;  Location: [facility];  Service: Endoscopy;  Laterality: N/A;   FINGER SURGERY Left    index   NASAL SINUS SURGERY     x 2 remotely, Dr Ethyl   PACEMAKER IMPLANT N/A 09/13/2020   Procedure: PACEMAKER IMPLANT;  Surgeon: Fernande Elspeth BROCKS, MD;  Location[facility] INVASIVE CV LAB;  Service: Cardiovascular;  Laterality: N/A;   PARATHYROIDECTOMY Left 09/14/2022   Procedure: LEFT PARATHYROIDECTOMY;  Surgeon:  Eletha Boas, MD;  Location: [facility];  Service: General;  Laterality: Left;   TONSILLECTOMY     TOTAL ABDOMINAL HYSTERECTOMY W/ BILATERAL SALPINGOOPHORECTOMY     TOTAL HIP ARTHROPLASTY Left 07/18/2021   Procedure: TOTAL HIP ARTHROPLASTY ANTERIOR APPROACH;  Surgeon: Fidel Rogue, MD;  Location: [facility];  Service: Orthopedics;  Laterality: Left;   TOTAL KNEE ARTHROPLASTY Right ~2008   Family History  Problem Relation Age of Onset   Hypertension Mother    Colon cancer Mother    Breast cancer Mother    Ovarian cancer Mother    Hypertension Father    Diabetes Father    Head & neck cancer Sister    CAD Neg Hx    Social History   Socioeconomic History   Marital status: Married    Spouse name: Raymonde Hamblin   Number of children: 1   Years of education: Not on file   Highest education level: Some college, no degree  Occupational History   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  Tobacco Use   Smoking status: Former    Current packs/day: 0.00    Types: Cigarettes    Quit date: 01/06/1980    Years since quitting: 43.5   Smokeless tobacco: Never   Tobacco comments:    smoked from 1970 to 1982, less than 1 ppd  Vaping Use   Vaping status: Never Used  Substance and Sexual Activity   Alcohol  use: Yes    Alcohol /week: 1.0 standard drink of alcohol     Types: 1 Glasses of wine per week    Comment: rare   Drug use: Never   Sexual activity: Not Currently    Birth control/protection: None  Other Topics Concern   Not on file  Social History Narrative   Lives w/ husband, and son Marolyn   Retired Engineer, civil (consulting)   Lives in [place]   Social Drivers of Health   Financial Resource Strain: Low Risk  (06/27/2023)   Overall Financial Resource Strain (CARDIA)    Difficulty of Paying Living Expenses: Not hard at all  Food Insecurity: No Food Insecurity (06/27/2023)   Hunger Vital Sign    Worried About Running Out of Food in the Last Year: Never true    Ran Out of Food in the Last Year: Never true   Transportation Needs: No Transportation Needs (06/27/2023)   PRAPARE - Transportation  Lack of Transportation (Medical): No    Lack of Transportation (Non-Medical): No  Physical Activity: Insufficiently Active (06/27/2023)   Exercise Vital Sign    Days of Exercise per Week: 3 days    Minutes of Exercise per Session: 30 min  Stress: No Stress Concern Present (06/27/2023)   [employer] of Occupational Health - Occupational Stress Questionnaire    Feeling of Stress: Not at all  Social Connections: Socially Isolated (06/27/2023)   Social Connection and Isolation Panel    Frequency of Communication with Friends and Family: More than three times a week    Frequency of Social Gatherings with Friends and Family: More than three times a week    Attends Religious Services: Never    Database administrator or Organizations: No    Attends Banker Meetings: Never    Marital Status: Widowed    Tobacco Counseling Counseling given: Not Answered Tobacco comments: smoked from 1970 to 1982, less than 1 ppd    Clinical Intake:  Pre-visit preparation completed: Yes  Pain : 0-10 Pain Type: Chronic pain (long periods of standing aggravate) Pain Location: Back (mid back and sciatic pain) Effect of Pain on Daily Activities: takes tylenol  and uses heat pad     BMI - recorded: 38.58 Nutritional Status: BMI > 30  Obese Diabetes: No  Lab Results  Component Value Date   HGBA1C 4.4 (L) 09/06/2022   HGBA1C 4.8 03/23/2021   HGBA1C 5.5 12/29/2019     How often do you need to have someone help you when you read instructions, pamphlets, or other written materials from your doctor or pharmacy?: 1 - Never  Interpreter Needed?: No  Information entered by :: Lolita Libra, CMA   Activities of Daily Living     06/27/2023    1:42 PM 11/29/2022    5:00 PM  In your present state of health, do you have any difficulty performing the following activities:  Hearing? 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 Saw dr in past, not ready for hearing aid at present   Vision? 0 1  Difficulty concentrating or making decisions? 0 0  Walking or climbing stairs? 1   Comment hx of knee replacement   Dressing or bathing? 0   Doing errands, shopping? 0 0  Preparing Food and eating ? N   Using the Toilet? N   In the past six months, have you accidently leaked urine? Y   Comment Sees urology   Do you have problems with loss of bowel control? Y   Comment Has bowel condition and lactose intolerance.   Managing your Medications? N   Managing your Finances? N   Housekeeping or managing your Housekeeping? N     Patient Care Team: Amon Aloysius BRAVO, MD as PCP - General (Internal Medicine) Fernande Elspeth BROCKS, MD as PCP - Cardiology (Cardiology) Fernande Elspeth BROCKS, MD as PCP - Electrophysiology (Cardiology) Fernande Elspeth BROCKS, MD as Consulting Physician (Cardiology) Donnald Charleston, MD as Consulting Physician (Gastroenterology) [place], Heather, OD as Consulting Physician (Optometry) Jarold Mayo, MD as Consulting Physician (Ophthalmology) Camella Fallow, MD as Consulting Physician (Orthopedic Surgery) Eletha Boas, MD as Consulting Physician (General Surgery)  I have updated your Care Teams any recent Medical Services you may have received from other providers in the past year.     Assessment:   This is a routine wellness examination for [place].  Hearing/Vision screen Hearing Screening - Comments:: Decreased hearing right ear. Vision Screening - Comments:: Wears  RX glasses -- up to date with routine eye exams. [facility]   Goals Addressed   None    Depression Screen     06/27/2023    2:23 PM 03/07/2023    1:15 PM 12/06/2022   11:09 AM 07/24/2022   10:19 AM 03/23/2022    9:10 AM 09/16/2021    4:00 PM 06/13/2021    1:09 PM  PHQ 2/9 Scores  PHQ - 2 Score 0 0 0 0 0 0 0    Fall Risk     06/27/2023    2:02 PM 03/07/2023    1:15 PM 12/06/2022   11:07 AM 07/24/2022   10:19 AM  07/19/2022    2:38 PM  Fall Risk   Falls in the past year? 1 0 1 0 0  Number falls in past yr: 0 0 0 0   Injury with Fall? 1 0 1 0   Comment tripped over a cord      Follow up Education provided Falls evaluation completed;Education provided Falls evaluation completed;Education provided Falls evaluation completed     MEDICARE RISK AT HOME:  Medicare Risk at Home Any stairs in or around the home?: Yes If so, are there any without handrails?: No Home free of loose throw rugs in walkways, pet beds, electrical cords, etc?: No (area rug in living room with corners contained.) Adequate lighting in your home to reduce risk of falls?: Yes Life alert?: No Use of a cane, walker or w/c?: No Grab bars in the bathroom?: No Shower chair or bench in shower?: Yes Elevated toilet seat or a handicapped toilet?: No  TIMED UP AND GO:  Was the test performed?  No  Cognitive Function: 6CIT completed        06/27/2023    2:05 PM 07/24/2022   11:20 AM 06/13/2021    1:14 PM  6CIT Screen  What Year? 0 points 0 points 0 points  What month? 0 points 0 points 0 points  What time? 0 points 0 points 0 points  Count back from 20 0 points 0 points 0 points  Months in reverse 0 points 0 points 0 points  Repeat phrase 0 points 0 points 2 points  Total Score 0 points 0 points 2 points    Immunizations Immunization History  Administered Date(s) Administered   Fluad Quad(high Dose 65+) 09/19/2018   Influenza Split 10/20/2020, 10/17/2021, 09/25/2022   Influenza, High Dose Seasonal PF 10/01/2014, 10/19/2015, 11/10/2016, 10/03/2017   Influenza-Unspecified 10/19/2010, 09/09/2013, 09/25/2019   Novavax(Covid-19) Vaccine 09/01/2022   PFIZER(Purple Top)SARS-COV-2 Vaccination 02/09/2019, 03/06/2019, 10/08/2019, 04/12/2020   PNEUMOCOCCAL CONJUGATE-20 03/23/2021   Pfizer Covid-19 Vaccine Bivalent Booster 94yrs & up 09/20/2020, 05/23/2021, 10/12/2021, 03/20/2022   Pfizer(Comirnaty)Fall Seasonal Vaccine 12 years and  older 02/13/2023   Pneumococcal Conjugate-13 03/07/2013   Pneumococcal Polysaccharide-23 09/09/2013   RSV,unspecified 12/13/2021   Tdap 07/26/2011, 03/23/2021   Zoster, Live 11/12/2012    Screening Tests Health Maintenance  Topic Date Due   Medicare Annual Wellness (AWV)  07/24/2023   INFLUENZA VACCINE  08/03/2023   COVID-19 Vaccine (11 - Pfizer risk 2024-25 season) 08/13/2023   Colonoscopy  03/11/2025   DTaP/Tdap/Td (3 - Td or Tdap) 03/24/2031   Pneumococcal Vaccine: 50+ Years  Completed   DEXA SCAN  Completed   Hepatitis C Screening  Completed   Hepatitis B Vaccines  Aged Out   HPV VACCINES  Aged Out   Meningococcal B Vaccine  Aged Out   Zoster Vaccines- Shingrix  Discontinued  Health Maintenance  Health Maintenance Due  Topic Date Due   Medicare Annual Wellness (AWV)  07/24/2023   Health Maintenance Items Addressed: All HM up to date.  Additional Screening:  Vision Screening: Recommended annual ophthalmology exams for early detection of glaucoma and other disorders of the eye. Would you like a referral to an eye doctor? No    Dental Screening: Recommended annual dental exams for proper oral hygiene  Community Resource Referral / Chronic Care Management: CRR required this visit?  No   CCM required this visit?  No   Plan:    I have personally reviewed and noted the following in the patient's chart:   Medical and social history Use of alcohol , tobacco or illicit drugs  Current medications and supplements including opioid prescriptions. Patient is not currently taking opioid prescriptions. Functional ability and status Nutritional status Physical activity Advanced directives List of other physicians Hospitalizations, surgeries, and ER visits in previous 12 months Vitals Screenings to include cognitive, depression, and falls Referrals and appointments  In addition, I have reviewed and discussed with patient certain preventive protocols, quality  metrics, and best practice recommendations. A written personalized care plan for preventive services as well as general preventive health recommendations were provided to patient.   Lolita Libra, CMA   06/27/2023   After Visit Summary: (MyChart) Due to this being a telephonic visit, the after visit summary with patients personalized plan was offered to patient via MyChart   Notes: Nothing significant to report at this time.

## 2023-06-27 NOTE — Patient Instructions (Signed)
 Veronica Bradford , Thank you for taking time out of your busy schedule to complete your Annual Wellness Visit with me. I enjoyed our conversation and look forward to speaking with you again next year. I, as well as your care team,  appreciate your ongoing commitment to your health goals. Please review the following plan we discussed and let me know if I can assist you in the future. I hope all goes well with your upcoming ablation!               Your Game plan/ To Do List                   Follow up Visits: Next Medicare AWV with our clinical staff: 07/01/24 1:40pm  Telephone visit    Next Office Visit with your provider: 07/24/24 10:20am Dr Amon  Clinician Recommendations:  Aim for 30 minutes of exercise or brisk walking, 6-8 glasses of water , and 5 servings of fruits and vegetables each day.       This is a list of the screening recommended for you and due dates:  Health Maintenance  Topic Date Due   Medicare Annual Wellness Visit  07/24/2023   Flu Shot  08/03/2023   COVID-19 Vaccine (11 - Pfizer risk 2024-25 season) 08/13/2023   Colon Cancer Screening  03/11/2025   DTaP/Tdap/Td vaccine (3 - Td or Tdap) 03/24/2031   Pneumococcal Vaccine for age over 69  Completed   DEXA scan (bone density measurement)  Completed   Hepatitis C Screening  Completed   Hepatitis B Vaccine  Aged Out   HPV Vaccine  Aged Out   Meningitis B Vaccine  Aged Out   Zoster (Shingles) Vaccine  Discontinued    See attachments for Fall Prevention Tips.

## 2023-06-29 ENCOUNTER — Ambulatory Visit: Payer: Self-pay | Admitting: Cardiology

## 2023-07-05 ENCOUNTER — Ambulatory Visit (HOSPITAL_BASED_OUTPATIENT_CLINIC_OR_DEPARTMENT_OTHER): Admitting: Pulmonary Disease

## 2023-07-05 ENCOUNTER — Telehealth: Payer: Self-pay | Admitting: Cardiology

## 2023-07-05 ENCOUNTER — Encounter (HOSPITAL_BASED_OUTPATIENT_CLINIC_OR_DEPARTMENT_OTHER): Payer: Self-pay | Admitting: Pulmonary Disease

## 2023-07-05 VITALS — BP 144/69 | HR 62 | Ht 67.5 in | Wt 253.0 lb

## 2023-07-05 DIAGNOSIS — G4733 Obstructive sleep apnea (adult) (pediatric): Secondary | ICD-10-CM | POA: Diagnosis not present

## 2023-07-05 NOTE — Telephone Encounter (Signed)
 Patient would like to know if she can have her lab work done on 7/25 when she sees Dr. Amon.

## 2023-07-05 NOTE — Progress Notes (Signed)
 Subjective:    Patient ID: Veronica Bradford, female    DOB: 06/11/1945, 78 y.o.   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 She uses a CPAP machine with nasal pillows consistently for six and a half hours nightly, effectively reducing her apnea events to about two per hour. She reports no issues with her current CPAP setup.  She has atrial fibrillation, managed with anticoagulation therapy and Tikosyn , reducing episodes to once every six weeks. She is scheduled for another ablation procedure. Pulsatile tinnitus alerts her to atrial fibrillation episodes, even during sleep.   Significant tests/ events reviewed  HST 2015 mod OSA, AHI 20/h   Past Medical History:  Diagnosis Date   Atrial fibrillation (HCC)    Atrial fibrillation (HCC)    Back pain    CHF (congestive heart failure) (HCC)    grade 2 HF   Chronic sinusitis    s/p 2 surgeries remotely, Dr Ethyl   Closed fracture of metatarsal of left foot    L foot, fifth metatarsal   Complication of anesthesia    woke up once during procedure   Dysrhythmia    a-fib   Ectopic pregnancy    Endometrial cancer (HCC) 2004   Early diagnosis   GERD (gastroesophageal reflux disease)    Hyperlipemia    Hypertension    IBS (irritable bowel syndrome) 08/13/2014   Dx 2015, diarrhea on-off, Dr Donnald    Lumbar radiculopathy 08/2012   MRI in 08/2012   Mild hyperparathyroidism (HCC)    Osteopenia    PAF (paroxysmal atrial fibrillation) (HCC)    Presence of permanent cardiac pacemaker    Sciatica of right side 08/2013   Received Depomedrol 80 mg injection   Sleep apnea 09/09/2013   Dx with OSA in 2015, by Dr. Fernande,   wears CPAPAPAP     Review of  Systems neg for any significant sore throat, dysphagia, itching, sneezing, nasal congestion or excess/ purulent secretions, fever, chills, sweats, unintended wt loss, pleuritic or exertional cp, hempoptysis, orthopnea pnd or change in chronic leg swelling. Also denies presyncope, palpitations, heartburn, abdominal pain, nausea, vomiting, diarrhea or change in bowel or urinary habits, dysuria,hematuria, rash, arthralgias, visual complaints, headache, numbness weakness or ataxia.     Objective:   Physical Exam   Gen. Pleasant, obese, in no distress ENT - no lesions, no post nasal drip Neck: No JVD, no thyromegaly, no carotid bruits Lungs: no use of accessory muscles, no dullness to percussion, decreased without rales or rhonchi  Cardiovascular: Rhythm regular, heart sounds  normal, no murmurs or gallops, no peripheral edema Musculoskeletal: No deformities, no cyanosis or clubbing , no tremors      Assessment & Plan:     Obstructive Sleep Apnea (OSA) OSA diagnosed in 2015 with a home sleep study showing moderate severity (20+ events per hour). Maintained on CPAP therapy with good compliance and efficacy. Current CPAP settings are effective with an average of 2 events per hour, indicating well-controlled OSA. Recent weight loss may impact OSA severity. - Continue CPAP therapy . CPAP download confirms excellent compliance, god control of events & avg pr 10.5 on auto settings. CPAP has certianly helped improve her daytime  somnolence & fatigue - Send a note to Dr. Amon indicating that GLP-1 receptor agonists are now approved for sleep apnea, which may assist in weight management.  Atrial Fibrillation Chronic atrial fibrillation with episodes occurring approximately every six weeks. Previously underwent an unsuccessful ablation with significant complications, including chest pain and esophageal burn. Currently managed with Tikosyn , which reduces AFib episodes to 3-5 hours. Scheduled for another  ablation with updated techniques to potentially improve outcomes. - Continue Tikosyn  for atrial fibrillation management. - Proceed with scheduled ablation

## 2023-07-05 NOTE — Telephone Encounter (Signed)
 Called patient. She is having a physical and labs on 7/25 with Dr. Amon.  She is a tough stick.  She is requesting to have the labs for ablation done at that time.  I adv this likely will be okay and that I will let Dr. Kennyth and team know.

## 2023-07-09 ENCOUNTER — Ambulatory Visit (HOSPITAL_COMMUNITY)
Admission: RE | Admit: 2023-07-09 | Discharge: 2023-07-09 | Disposition: A | Discharge status: Home / Self Care | Source: Ambulatory Visit | Attending: Internal Medicine | Admitting: Internal Medicine

## 2023-07-09 DIAGNOSIS — I700 Atherosclerosis of aorta: Secondary | ICD-10-CM | POA: Insufficient documentation

## 2023-07-09 DIAGNOSIS — I480 Paroxysmal atrial fibrillation: Secondary | ICD-10-CM | POA: Diagnosis not present

## 2023-07-09 MED ORDER — IOHEXOL 350 MG/ML SOLN
100.0000 mL | Freq: Once | INTRAVENOUS | Status: AC | PRN
  Administered 2023-07-09: 100 mL via INTRAVENOUS

## 2023-07-11 ENCOUNTER — Encounter: Payer: Self-pay | Admitting: Cardiology

## 2023-07-12 NOTE — Telephone Encounter (Signed)
 The pt called to talk with a nurse. Transmission received. The pt spoke with Randall, rn.

## 2023-07-16 NOTE — Telephone Encounter (Signed)
 Recommended device adjustments if Pt has continued V pacing with symptoms:  Program DDD (R) with VIP which can extend AV delay to 450 ms. Per Veronica Bradford with [facility] would probably have to go 200/200 + 200 ms.  Evaluate if rate response is needed.  If you program DDDR set your PVARP back to 275 ms and use auto sensing in the RA at 0.3 ms.  Will continue to monitor for now per Pt.

## 2023-07-18 ENCOUNTER — Other Ambulatory Visit: Payer: Self-pay | Admitting: Internal Medicine

## 2023-07-18 DIAGNOSIS — I480 Paroxysmal atrial fibrillation: Secondary | ICD-10-CM

## 2023-07-18 NOTE — Telephone Encounter (Signed)
 Xarelto  20mg  refill request received. Pt is 78 years old, weight-114.8kg, Crea-0.71 on 03/22/23, last seen by Dr. Kennyth on 04/02/23, Diagnosis-Afib, CrCl-145.97 mL/min; Dose is appropriate based on dosing criteria. Will send in refill to requested pharmacy.

## 2023-07-25 ENCOUNTER — Telehealth (HOSPITAL_COMMUNITY): Payer: Self-pay

## 2023-07-25 ENCOUNTER — Ambulatory Visit (INDEPENDENT_AMBULATORY_CARE_PROVIDER_SITE_OTHER): Admitting: Internal Medicine

## 2023-07-25 ENCOUNTER — Ambulatory Visit: Payer: Self-pay | Admitting: Internal Medicine

## 2023-07-25 ENCOUNTER — Encounter: Payer: Self-pay | Admitting: Internal Medicine

## 2023-07-25 VITALS — BP 112/76 | HR 72 | Temp 97.9°F | Resp 18 | Ht 67.5 in | Wt 255.4 lb

## 2023-07-25 DIAGNOSIS — H9193 Unspecified hearing loss, bilateral: Secondary | ICD-10-CM | POA: Diagnosis not present

## 2023-07-25 DIAGNOSIS — G4733 Obstructive sleep apnea (adult) (pediatric): Secondary | ICD-10-CM | POA: Diagnosis not present

## 2023-07-25 DIAGNOSIS — Z0001 Encounter for general adult medical examination with abnormal findings: Secondary | ICD-10-CM

## 2023-07-25 DIAGNOSIS — E559 Vitamin D deficiency, unspecified: Secondary | ICD-10-CM

## 2023-07-25 DIAGNOSIS — Z0000 Encounter for general adult medical examination without abnormal findings: Secondary | ICD-10-CM | POA: Diagnosis not present

## 2023-07-25 DIAGNOSIS — E611 Iron deficiency: Secondary | ICD-10-CM

## 2023-07-25 DIAGNOSIS — E785 Hyperlipidemia, unspecified: Secondary | ICD-10-CM | POA: Diagnosis not present

## 2023-07-25 DIAGNOSIS — I480 Paroxysmal atrial fibrillation: Secondary | ICD-10-CM | POA: Diagnosis not present

## 2023-07-25 LAB — LIPID PANEL
Cholesterol: 154 mg/dL (ref 0–200)
HDL: 63.6 mg/dL (ref >39.00)
LDL Cholesterol: 76 mg/dL (ref 0–99)
NonHDL: 90.32
Total CHOL/HDL Ratio: 2
Triglycerides: 74 mg/dL (ref 0.0–149.0)
VLDL: 14.8 mg/dL (ref 0.0–40.0)

## 2023-07-25 LAB — CBC WITH DIFFERENTIAL/PLATELET
Basophils Absolute: 0 K/uL (ref 0.0–0.1)
Basophils Relative: 0.9 % (ref 0.0–3.0)
Eosinophils Absolute: 0.2 K/uL (ref 0.0–0.7)
Eosinophils Relative: 3.2 % (ref 0.0–5.0)
HCT: 39.9 % (ref 36.0–46.0)
Hemoglobin: 13.2 g/dL (ref 12.0–15.0)
Lymphocytes Relative: 19 % (ref 12.0–46.0)
Lymphs Abs: 1 K/uL (ref 0.7–4.0)
MCHC: 33 g/dL (ref 30.0–36.0)
MCV: 90.9 fl (ref 78.0–100.0)
Monocytes Absolute: 0.5 K/uL (ref 0.1–1.0)
Monocytes Relative: 10.3 % (ref 3.0–12.0)
Neutro Abs: 3.5 K/uL (ref 1.4–7.7)
Neutrophils Relative %: 66.6 % (ref 43.0–77.0)
Platelets: 244 K/uL (ref 150.0–400.0)
RBC: 4.39 Mil/uL (ref 3.87–5.11)
RDW: 14.7 % (ref 11.5–15.5)
WBC: 5.2 K/uL (ref 4.0–10.5)

## 2023-07-25 LAB — COMPREHENSIVE METABOLIC PANEL WITH GFR
ALT: 15 U/L (ref 0–35)
AST: 19 U/L (ref 0–37)
Albumin: 4.1 g/dL (ref 3.5–5.2)
Alkaline Phosphatase: 58 U/L (ref 39–117)
BUN: 13 mg/dL (ref 6–23)
CO2: 26 meq/L (ref 19–32)
Calcium: 9.2 mg/dL (ref 8.4–10.5)
Chloride: 108 meq/L (ref 96–112)
Creatinine, Ser: 0.72 mg/dL (ref 0.40–1.20)
GFR: 81.69 mL/min (ref >60.00)
Glucose, Bld: 91 mg/dL (ref 70–99)
Potassium: 4.4 meq/L (ref 3.5–5.1)
Sodium: 142 meq/L (ref 135–145)
Total Bilirubin: 0.8 mg/dL (ref 0.2–1.2)
Total Protein: 6.3 g/dL (ref 6.0–8.3)

## 2023-07-25 LAB — IBC + FERRITIN
Ferritin: 19.2 ng/mL (ref 10.0–291.0)
Iron: 37 ug/dL — ABNORMAL LOW (ref 42–145)
Saturation Ratios: 9.9 % — ABNORMAL LOW (ref 20.0–50.0)
TIBC: 372.4 ug/dL (ref 250.0–450.0)
Transferrin: 266 mg/dL (ref 212.0–360.0)

## 2023-07-25 LAB — MAGNESIUM: Magnesium: 2 mg/dL (ref 1.5–2.5)

## 2023-07-25 LAB — VITAMIN D 25 HYDROXY (VIT D DEFICIENCY, FRACTURES): VITD: 28.88 ng/mL — ABNORMAL LOW (ref 30.00–100.00)

## 2023-07-25 NOTE — Patient Instructions (Addendum)
 Recommend a flu shot this fall   Check the  blood pressure regularly Blood pressure goal:  between 110/65 and  135/85. If it is consistently higher or lower, let me know  Once you are ready, call for a GLP-1 prescription.  Will start Zepbound weekly, at a low dose. Get an appointment to see me about 4 to 6 weeks after you started Zepbound.  We arrange a referral to an audiologist  GO TO THE LAB :  Get the blood work   Your results will be posted on MyChart with my comments  Next office visit for a routine checkup in 4 months Please make an appointment before you leave today

## 2023-07-25 NOTE — Assessment & Plan Note (Signed)
 Here for CPX - Td 2023 - pnm shot 2015;  prevnar 2015; PNM 20: 23 -  zostavax 2013: had s/e  severe HA (and fever?) after zostavax ;  reluctant to take shingrex  -  COVID vax 07/04/23 - RSV 2023 - ref a flu shot  this fall  --Female care:  h/o endometrial ca; sees  gyn    -Last mammogram 03/2023 ([facility]) --CCS:  multiple cscopes, last colonoscopy Dr. Donnald 03/11/2020, no polyps, random BX: Negative.  Next per GI  --Diet and exercise discussed --Labs:   CMP FLP CBC anemia panel magnesium  vitamin D  -ACP: documents charted

## 2023-07-25 NOTE — Progress Notes (Signed)
 Subjective:    Patient ID: Veronica Bradford, female    DOB: 1945/03/05, 78 y.o.   MRN: [id number]  DOS:  07/25/2023 Type of visit - description: CPX  Here for CPX. Multiple other issues addressed today. Denies chest pain or difficulty breathing.  Chronic edema at baseline. Occasional diarrhea at baseline.  No blood in the stools. Denies any cough. No LUTS. Has noted some decrease in hearing, worse on the left, request a referral.   Review of Systems  Other than above, a 14 point review of systems is negative     Past Medical History:  Diagnosis Date   Atrial fibrillation (HCC)    Atrial fibrillation (HCC)    Back pain    CHF (congestive heart failure) (HCC)    grade 2 HF   Chronic sinusitis    s/p 2 surgeries remotely, Dr Ethyl   Closed fracture of metatarsal of left foot    L foot, fifth metatarsal   Complication of anesthesia    woke up once during procedure   Dysrhythmia    a-fib   Ectopic pregnancy    Endometrial cancer (HCC) 2004   Early diagnosis   GERD (gastroesophageal reflux disease)    Hyperlipemia    Hypertension    IBS (irritable bowel syndrome) 08/13/2014   Dx 2015, diarrhea on-off, Dr Donnald    Lumbar radiculopathy 08/2012   MRI in 08/2012   Mild hyperparathyroidism (HCC)    Osteopenia    PAF (paroxysmal atrial fibrillation) (HCC)    Presence of permanent cardiac pacemaker    Sciatica of right side 08/2013   Received Depomedrol 80 mg injection   Sleep apnea 09/09/2013   Dx with OSA in 2015, by Dr. Fernande,   wears CPAPAPAP    Past Surgical History:  Procedure Laterality Date   ATRIAL FIBRILLATION ABLATION N/A 09/04/2017   Procedure: ATRIAL FIBRILLATION ABLATION;  Surgeon: Kelsie Agent, MD;  Location: [facility];  Service: Cardiovascular;  Laterality: N/A;   BIOPSY  03/11/2020   Procedure: BIOPSY;  Surgeon: Donnald Charleston, MD;  Location: [facility];  Service: Endoscopy;;  EGD and COLON   CARPAL TUNNEL RELEASE Right 06/17/2019    CARPAL TUNNEL RELEASE Left 11/2020   CHOLECYSTECTOMY  ~2006   COLONOSCOPY WITH PROPOFOL  N/A 03/11/2020   Procedure: COLONOSCOPY WITH PROPOFOL ;  Surgeon: Donnald Charleston, MD;  Location: [facility];  Service: Endoscopy;  Laterality: N/A;   DILATION AND CURETTAGE OF UTERUS     after SAB   ESOPHAGOGASTRODUODENOSCOPY  02/09/2014   Dr. Donnald   ESOPHAGOGASTRODUODENOSCOPY (EGD) WITH PROPOFOL  N/A 03/11/2020   Procedure: ESOPHAGOGASTRODUODENOSCOPY (EGD) WITH PROPOFOL ;  Surgeon: Donnald Charleston, MD;  Location: [facility];  Service: Endoscopy;  Laterality: N/A;   FINGER SURGERY Left    index   NASAL SINUS SURGERY     x 2 remotely, Dr Ethyl   PACEMAKER IMPLANT N/A 09/13/2020   Procedure: PACEMAKER IMPLANT;  Surgeon: Fernande Elspeth BROCKS, MD;  Location[facility] CV LAB;  Service: Cardiovascular;  Laterality: N/A;   PARATHYROIDECTOMY Left 09/14/2022   Procedure: LEFT PARATHYROIDECTOMY;  Surgeon: Eletha Boas, MD;  Location: [facility];  Service: General;  Laterality: Left;   TONSILLECTOMY     TOTAL ABDOMINAL HYSTERECTOMY W/ BILATERAL SALPINGOOPHORECTOMY     TOTAL HIP ARTHROPLASTY Left 07/18/2021   Procedure: TOTAL HIP ARTHROPLASTY ANTERIOR APPROACH;  Surgeon: Fidel Rogue, MD;  Location: [facility];  Service: Orthopedics;  Laterality: Left;   TOTAL KNEE ARTHROPLASTY Right ~2008   Social History  Socioeconomic History   Marital status: Married    Spouse name: Vira Chaplin   Number of children: 1   Years of education: Not on file   Highest education level: Associate degree: occupational, technical, or vocational program  Occupational History   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  Tobacco Use   Smoking status: Former    Current packs/day: 0.00    Types: Cigarettes    Quit date: 01/06/1980    Years since quitting: 43.5   Smokeless tobacco: Never   Tobacco comments:    smoked from 1970 to 1982, less than 1 ppd  Vaping Use   Vaping status: Never Used  Substance and Sexual Activity   Alcohol  use:  Yes    Alcohol /week: 1.0 standard drink of alcohol     Types: 1 Glasses of wine per week    Comment: rare   Drug use: Never   Sexual activity: Not Currently    Birth control/protection: None  Other Topics Concern   Not on file  Social History Narrative   Lives w/ husband, and son Marolyn   Retired Engineer, civil (consulting)   Lives in [place]   Social Drivers of Health   Financial Resource Strain: Low Risk  (07/23/2023)   Overall Financial Resource Strain (CARDIA)    Difficulty of Paying Living Expenses: Not hard at all  Food Insecurity: No Food Insecurity (07/23/2023)   Hunger Vital Sign    Worried About Running Out of Food in the Last Year: Never true    Ran Out of Food in the Last Year: Never true  Transportation Needs: No Transportation Needs (07/23/2023)   PRAPARE - Administrator, Civil Service (Medical): No    Lack of Transportation (Non-Medical): No  Physical Activity: Insufficiently Active (07/23/2023)   Exercise Vital Sign    Days of Exercise per Week: 4 days    Minutes of Exercise per Session: 20 min  Stress: No Stress Concern Present (07/23/2023)   [employer] of Occupational Health - Occupational Stress Questionnaire    Feeling of Stress: Only a little  Social Connections: Moderately Integrated (07/23/2023)   Social Connection and Isolation Panel    Frequency of Communication with Friends and Family: More than three times a week    Frequency of Social Gatherings with Friends and Family: More than three times a week    Attends Religious Services: 1 to 4 times per year    Active Member of [employer] or Organizations: No    Attends Engineer, structural: Not on file    Marital Status: Married  Recent Concern: Social Connections - Socially Isolated (06/27/2023)   Social Connection and Isolation Panel    Frequency of Communication with Friends and Family: More than three times a week    Frequency of Social Gatherings with Friends and Family: More than three times a week     Attends Religious Services: Never    Database administrator or Organizations: No    Attends Banker Meetings: Never    Marital Status: Widowed  Intimate Partner Violence: Not At Risk (06/27/2023)   Humiliation, Afraid, Rape, and Kick questionnaire    Fear of Current or Ex-Partner: No    Emotionally Abused: No    Physically Abused: No    Sexually Abused: No    Current Outpatient Medications  Medication Instructions   acetaminophen  (TYLENOL ) 500 mg, Every 6 hours PRN   acetaminophen -codeine  (TYLENOL  #3) 300-30 MG tablet 1 tablet, Oral, At bedtime PRN   ARTIFICIAL TEAR  SOLUTION OP 1 drop, 4 times daily PRN   B Complex-C (SUPER B COMPLEX/VITAMIN C PO) 1 tablet, Daily   Co Q 10 100 mg, Daily   colestipol (COLESTID) 2 g, 2 times daily   CRANBERRY EXTRACT PO 2 tablets, Daily at bedtime   diltiazem  (CARDIZEM  CD) 120 mg, Oral, Daily   diltiazem  (CARDIZEM ) 30 MG tablet Take 1 tablet every 4 hours AS NEEDED for heart rate >100 as long as blood pressure >100.   diphenoxylate-atropine (LOMOTIL) 2.5-0.025 MG tablet 1 tablet, 4 times daily PRN   dofetilide  (TIKOSYN ) 250 mcg, Oral, 2 times daily   estradiol  (ESTRACE ) 0.1 MG/GM vaginal cream 1 Applicatorful, Vaginal, 3 times weekly   ferrous fumarate  (HEMOCYTE - 106 MG FE) 325 (106 Fe) MG TABS tablet 106 mg of iron, Oral, 2 times daily, Take apart from pantoprazole    fluticasone  (FLONASE ) 50 MCG/ACT nasal spray 1 spray, Daily   furosemide  (LASIX ) 40 mg, Oral, As needed   olopatadine  (PATANOL) 0.1 % ophthalmic solution 1 drop, Daily PRN   pantoprazole  (PROTONIX ) 40 mg, Oral, 2 times daily   potassium chloride  SA (KLOR-CON  M) 20 MEQ tablet TAKE 1 TABLET(20 MEQ) BY MOUTH DAILY   pravastatin  (PRAVACHOL ) 40 mg, Oral, Every evening   spironolactone  (ALDACTONE ) 25 MG tablet TAKE 1 TABLET(25 MG) BY MOUTH DAILY   sucralfate  (CARAFATE ) 1 g, 2 times daily PRN   valsartan  (DIOVAN ) 40 mg, Oral, Daily   XARELTO  20 MG TABS tablet TAKE 1 TABLET(20 MG)  BY MOUTH DAILY WITH SUPPER       Objective:   Physical Exam BP 112/76   Pulse 72   Temp 97.9 F (36.6 C) (Oral)   Resp 18   Ht 5' 7.5 (1.715 m)   Wt 255 lb 6 oz (115.8 kg)   SpO2 98%   BMI 39.41 kg/m  General: Well developed, NAD, BMI noted Neck: No  thyromegaly  HEENT:  Normocephalic . Face symmetric, atraumatic Lungs:  CTA B Normal respiratory effort, no intercostal retractions, no accessory muscle use. Heart: RRR,  no murmur.  Abdomen:  Not distended, soft, non-tender. No rebound or rigidity.   Lower extremities: Trace pretibial edema bilaterally  Skin: Exposed areas without rash. Not pale. Not jaundice Neurologic:  alert & oriented X3.  Speech normal, gait appropriate for age and unassisted Strength symmetric and appropriate for age.  Psych: Cognition and judgment appear intact.  Cooperative with normal attention span and concentration.  Behavior appropriate. No anxious or depressed appearing.     Assessment   Assessment: Prediabetes HTN Hyperlipidemia Chronic lower extremity edema L>R (eval by previous pcp, related to varicose veins?) CV:  -- P. Atrial fibrillation, A. flutter: on xarelto  flecainide -diltiazem   prn --Ablation 09/2017 --Pacemaker 09-2020 GI: Dr Donnald  --GERD (zantac or protonix  prn), IBS, chronic diarrhea --GI records: Colonoscopies 1998, 2004, 2009 8 and 07-2013. Negative for polyps or IBD. + Diverticuli. EGD/cscope 11/2016; EGD/Cscope 03/11/2020   --Increased LFTs: CT abdomen 05/2016: Normal liver. 05/2017: WNL  Hepatitis B and C, Alpha 1 antitrypsin, anti-smooth muscle antibody, ANA trans-ferritin  OSA--- CPAP, Dr. Tammy Osteopenia:  Multiple  Dexas, last few:  T score 2009  -1.1; T score 2015  -0.7; T score 05/2017 -1.8 DJD --Chronic back pain-- tylenol  #3 prn --- RF per PCP --history of epidurals in the past, MRI 2014: Scoliosis,DJD --CIPRO  pre dental d/t knee replacement (rx by ortho) H/o endometrial cancer, found during a  hysterectomy, no chemotherapy or XRT.  Primary hyperparathyroidism: Chronic hypercalcemia, L parathyroidectomy 09/14/2022  Skin cancer   PLAN: Here for CPX - Td 2023 - pnm shot 2015;  prevnar 2015; PNM 20: 23 -  zostavax 2013: had s/e  severe HA (and fever?) after zostavax ;  reluctant to take shingrex  -  COVID vax 07/04/23 - RSV 2023 - ref a flu shot  this fall  --Female care:  h/o endometrial ca; sees  gyn    -Last mammogram 03/2023 ([facility]) --CCS:  multiple cscopes, last colonoscopy Dr. Donnald 03/11/2020, no polyps, random BX: Negative.  Next per GI  --Diet and exercise discussed --Labs:   CMP FLP CBC anemia panel magnesium  vitamin D  -ACP: documents charted  Other issues: Prediabetes: Blood sugars have been WNL.   SABRA HTN: BP looks good today, currently on Diovan  10 mg daily, Aldactone . Is not taking Cardizem . BP seems controlled, no change. Hyperlipidemia: Diet controlled, checking labs Atrial fibrillation: To have an ablation soon, labs today. Iron deficiency: No GI symptoms other than episodic diarrhea, on iron supplements, check CBC, request anemia panel. OSA: Good CPAP compliance. Morbid obesity: We discussed GLP-1's before, at this point both pulmonary and cardiology have encouraged her to initiate medicines. Potential issues include cost, coverage, GI side effects. We agreed to proceed after the ablation. She will reach out for a prescription, we will start with a low dose of Zepbound for OSA, and titrate gradually. Continue with a healthy diet and follow-up a month after the first injection. HOH: Request a referral to audiology. RTC 4 months.  Sooner if she is started Zepbound

## 2023-07-25 NOTE — Telephone Encounter (Signed)
 Spoke with patient to discuss upcoming procedure.   CT: completed.  Labs: completed today.   Any recent signs of acute illness or been started on antibiotics? No Any new medications started? No Any medications to hold? No Any missed doses of blood thinner? No Advised patient to continue taking ANTICOAGULANT: Xarelto  (Rivaroxaban ) daily without missing any doses.  Medication instructions:  On the morning of your procedure DO NOT take any medication., including Xarelto  or the procedure may be rescheduled. Nothing to eat or drink after midnight prior to your procedure.  Confirmed patient is scheduled for Atrial Fibrillation Ablation on Monday, July 30 with Dr. Sidra Kitty. Instructed patient to arrive at the Main Entrance A at [facility]: [address] [place], [place] [address] and check in at Admitting at 6:30 AM.  Advised of plan to go home the same day and will only stay overnight if medically necessary. You MUST have a responsible adult to drive you home and MUST be with you the first 24 hours after you arrive home or your procedure could be cancelled.  Patient verbalized understanding to all instructions provided and agreed to proceed with procedure.

## 2023-07-25 NOTE — Assessment & Plan Note (Signed)
 Here for CPX    Other issues: Prediabetes: Blood sugars have been WNL.   SABRA HTN: BP looks good today, currently on Diovan  10 mg daily, Aldactone . Is not taking Cardizem . BP seems controlled, no change. Hyperlipidemia: Diet controlled, checking labs Atrial fibrillation: To have an ablation soon, labs today. Iron deficiency: No GI symptoms other than episodic diarrhea, on iron supplements, check CBC, request anemia panel. OSA: Good CPAP compliance. Morbid obesity: We discussed GLP-1's before, at this point both pulmonary and cardiology have encouraged her to initiate medicines. Potential issues include cost, coverage, GI side effects. We agreed to proceed after the ablation. She will reach out for a prescription, we will start with a low dose of Zepbound for OSA, and titrate gradually. Continue with a healthy diet and follow-up a month after the first injection. HOH: Request a referral to audiology. RTC 4 months.  Sooner if she is started Zepbound

## 2023-07-26 MED ORDER — VITAMIN D (ERGOCALCIFEROL) 1.25 MG (50000 UNIT) PO CAPS
50000.0000 [IU] | ORAL_CAPSULE | ORAL | 0 refills | Status: AC
Start: 2023-07-26 — End: 2023-10-18

## 2023-07-27 MED ORDER — FERROUS FUMARATE 325 (106 FE) MG PO TABS: 1.0000 | ORAL_TABLET | Freq: Two times a day (BID) | ORAL | 1 refills | Status: DC

## 2023-07-27 NOTE — Addendum Note (Signed)
 Addended by: ESTELLE GILLIS D on: 07/27/2023 01:56 PM   Modules accepted: Orders

## 2023-07-31 NOTE — Pre-Procedure Instructions (Signed)
 Attempted to call patient regarding procedure instructions.  Left voicemail on the following items: Arrival time 0530- new arrival time Nothing to eat or drink after midnight No meds AM of procedure Responsible person to drive you home and stay with you for 24 hrs  Have you missed any doses of anti-coagulant Xarelto - should be taken once a day, if you have missed any doses.  Don't take medication day of procedure.

## 2023-07-31 NOTE — Pre-Procedure Instructions (Signed)
 Instructed patient on the following items: Arrival time 0530 Nothing to eat or drink after midnight No meds AM of procedure Responsible person to drive you home and stay with you for 24 hrs  Have you missed any doses of anti-coagulant Xarelto -  takes once a day,  hasn't missed any doses in last 4 weeks.

## 2023-08-01 ENCOUNTER — Other Ambulatory Visit: Payer: Self-pay

## 2023-08-01 ENCOUNTER — Encounter (HOSPITAL_COMMUNITY): Payer: Self-pay | Admitting: Cardiology

## 2023-08-01 ENCOUNTER — Ambulatory Visit (HOSPITAL_COMMUNITY)

## 2023-08-01 ENCOUNTER — Encounter (HOSPITAL_COMMUNITY)
Admission: RE | Disposition: A | Discharge status: Home / Self Care | Payer: Self-pay | Source: Home / Self Care | Attending: Cardiology

## 2023-08-01 ENCOUNTER — Ambulatory Visit (HOSPITAL_COMMUNITY)
Admission: RE | Admit: 2023-08-01 | Discharge: 2023-08-01 | Disposition: A | Discharge status: Home / Self Care | Attending: Cardiology | Admitting: Cardiology

## 2023-08-01 DIAGNOSIS — D6869 Other thrombophilia: Secondary | ICD-10-CM | POA: Diagnosis not present

## 2023-08-01 DIAGNOSIS — K219 Gastro-esophageal reflux disease without esophagitis: Secondary | ICD-10-CM | POA: Diagnosis not present

## 2023-08-01 DIAGNOSIS — Z87891 Personal history of nicotine dependence: Secondary | ICD-10-CM

## 2023-08-01 DIAGNOSIS — I495 Sick sinus syndrome: Secondary | ICD-10-CM | POA: Diagnosis not present

## 2023-08-01 DIAGNOSIS — Z79899 Other long term (current) drug therapy: Secondary | ICD-10-CM | POA: Insufficient documentation

## 2023-08-01 DIAGNOSIS — I480 Paroxysmal atrial fibrillation: Secondary | ICD-10-CM

## 2023-08-01 DIAGNOSIS — G4730 Sleep apnea, unspecified: Secondary | ICD-10-CM | POA: Insufficient documentation

## 2023-08-01 DIAGNOSIS — I483 Typical atrial flutter: Secondary | ICD-10-CM | POA: Diagnosis not present

## 2023-08-01 DIAGNOSIS — I110 Hypertensive heart disease with heart failure: Secondary | ICD-10-CM | POA: Insufficient documentation

## 2023-08-01 DIAGNOSIS — I509 Heart failure, unspecified: Secondary | ICD-10-CM | POA: Insufficient documentation

## 2023-08-01 DIAGNOSIS — Z7901 Long term (current) use of anticoagulants: Secondary | ICD-10-CM | POA: Diagnosis not present

## 2023-08-01 DIAGNOSIS — I5032 Chronic diastolic (congestive) heart failure: Secondary | ICD-10-CM

## 2023-08-01 DIAGNOSIS — Z950 Presence of cardiac pacemaker: Secondary | ICD-10-CM | POA: Diagnosis not present

## 2023-08-01 HISTORY — PX: ATRIAL FIBRILLATION ABLATION: EP1191

## 2023-08-01 SURGERY — ATRIAL FIBRILLATION ABLATION
Anesthesia: General

## 2023-08-01 MED ORDER — SODIUM CHLORIDE 0.9% FLUSH: 3.0000 mL | Freq: Two times a day (BID) | INTRAVENOUS | Status: DC

## 2023-08-01 MED ORDER — PHENYLEPHRINE 80 MCG/ML (10ML) SYRINGE FOR IV PUSH (FOR BLOOD PRESSURE SUPPORT)
PREFILLED_SYRINGE | INTRAVENOUS | Status: DC | PRN
  Administered 2023-08-01: 160 ug via INTRAVENOUS

## 2023-08-01 MED ORDER — LIDOCAINE 2% (20 MG/ML) 5 ML SYRINGE
INTRAMUSCULAR | Status: DC | PRN
  Administered 2023-08-01: 100 mg via INTRAVENOUS

## 2023-08-01 MED ORDER — ACETAMINOPHEN 10 MG/ML IV SOLN
1000.0000 mg | Freq: Once | INTRAVENOUS | Status: DC | PRN
  Administered 2023-08-01: 1000 mg via INTRAVENOUS
  Filled 2023-08-01: qty 100

## 2023-08-01 MED ORDER — ACETAMINOPHEN 325 MG PO TABS: 650.0000 mg | ORAL_TABLET | ORAL | Status: DC | PRN

## 2023-08-01 MED ORDER — SODIUM CHLORIDE 0.9% FLUSH: 3.0000 mL | INTRAVENOUS | Status: DC | PRN

## 2023-08-01 MED ORDER — SODIUM CHLORIDE 0.9 % IV SOLN: 250.0000 mL | INTRAVENOUS | Status: DC | PRN

## 2023-08-01 MED ORDER — ROCURONIUM BROMIDE 10 MG/ML (PF) SYRINGE
PREFILLED_SYRINGE | INTRAVENOUS | Status: DC | PRN
  Administered 2023-08-01: 70 mg via INTRAVENOUS

## 2023-08-01 MED ORDER — SODIUM CHLORIDE 0.9 % IV SOLN: INTRAVENOUS | Status: DC

## 2023-08-01 MED ORDER — RIVAROXABAN 20 MG PO TABS: 20.0000 mg | ORAL_TABLET | Freq: Once | ORAL | Status: DC

## 2023-08-01 MED ORDER — PROTAMINE SULFATE 10 MG/ML IV SOLN
INTRAVENOUS | Status: DC | PRN
  Administered 2023-08-01: 35 mg via INTRAVENOUS

## 2023-08-01 MED ORDER — ONDANSETRON HCL 4 MG/2ML IJ SOLN: 4.0000 mg | Freq: Four times a day (QID) | INTRAMUSCULAR | Status: DC | PRN

## 2023-08-01 MED ORDER — CEFAZOLIN SODIUM-DEXTROSE 2-4 GM/100ML-% IV SOLN
2.0000 g | Freq: Once | INTRAVENOUS | Status: AC
  Administered 2023-08-01: 2 g via INTRAVENOUS

## 2023-08-01 MED ORDER — FENTANYL CITRATE (PF) 100 MCG/2ML IJ SOLN: 25.0000 ug | INTRAMUSCULAR | Status: DC | PRN

## 2023-08-01 MED ORDER — HEPARIN SODIUM (PORCINE) 1000 UNIT/ML IJ SOLN
INTRAMUSCULAR | Status: DC | PRN
  Administered 2023-08-01: 18000 [IU] via INTRAVENOUS

## 2023-08-01 MED ORDER — SUGAMMADEX SODIUM 200 MG/2ML IV SOLN
INTRAVENOUS | Status: DC | PRN
  Administered 2023-08-01: 200 mg via INTRAVENOUS

## 2023-08-01 MED ORDER — IPRATROPIUM-ALBUTEROL 0.5-2.5 (3) MG/3ML IN SOLN
3.0000 mL | Freq: Once | RESPIRATORY_TRACT | Status: AC
  Administered 2023-08-01: 3 mL via RESPIRATORY_TRACT

## 2023-08-01 MED ORDER — HEPARIN (PORCINE) IN NACL 1000-0.9 UT/500ML-% IV SOLN
INTRAVENOUS | Status: DC | PRN
  Administered 2023-08-01 (×4): 500 mL

## 2023-08-01 MED ORDER — ONDANSETRON HCL 4 MG/2ML IJ SOLN
INTRAMUSCULAR | Status: DC | PRN
  Administered 2023-08-01: 4 mg via INTRAVENOUS

## 2023-08-01 MED ORDER — DOFETILIDE 250 MCG PO CAPS
250.0000 ug | ORAL_CAPSULE | Freq: Once | ORAL | Status: AC
  Administered 2023-08-01: 250 ug via ORAL
  Filled 2023-08-01: qty 1

## 2023-08-01 MED ORDER — ATROPINE SULFATE 1 MG/10ML IJ SOSY
PREFILLED_SYRINGE | INTRAMUSCULAR | Status: AC
  Filled 2023-08-01: qty 10

## 2023-08-01 MED ORDER — FENTANYL CITRATE (PF) 250 MCG/5ML IJ SOLN
INTRAMUSCULAR | Status: DC | PRN
  Administered 2023-08-01: 50 ug via INTRAVENOUS

## 2023-08-01 MED ORDER — FENTANYL CITRATE (PF) 100 MCG/2ML IJ SOLN
INTRAMUSCULAR | Status: AC
  Filled 2023-08-01: qty 2

## 2023-08-01 MED ORDER — PROPOFOL 10 MG/ML IV BOLUS
INTRAVENOUS | Status: DC | PRN
  Administered 2023-08-01: 150 mg via INTRAVENOUS

## 2023-08-01 MED ORDER — CEFAZOLIN SODIUM-DEXTROSE 2-4 GM/100ML-% IV SOLN
INTRAVENOUS | Status: AC
  Filled 2023-08-01: qty 100

## 2023-08-01 MED ORDER — DEXAMETHASONE SODIUM PHOSPHATE 10 MG/ML IJ SOLN
INTRAMUSCULAR | Status: DC | PRN
  Administered 2023-08-01: 10 mg via INTRAVENOUS

## 2023-08-01 MED ORDER — PHENYLEPHRINE HCL-NACL 20-0.9 MG/250ML-% IV SOLN
INTRAVENOUS | Status: DC | PRN
  Administered 2023-08-01: 40 ug/min via INTRAVENOUS

## 2023-08-01 MED ORDER — DROPERIDOL 2.5 MG/ML IJ SOLN: 0.6250 mg | Freq: Once | INTRAMUSCULAR | Status: DC | PRN

## 2023-08-01 MED ORDER — APIXABAN 5 MG PO TABS: 5.0000 mg | ORAL_TABLET | Freq: Once | ORAL | Status: DC

## 2023-08-01 SURGICAL SUPPLY — 21 items
CABLE FARASTAR GEN2 SNGL USE (CABLE) IMPLANT
CATH BI DIR 7FR CS F-J 12 PIN (CATHETERS) IMPLANT
CATH FARAWAVE 2.0 31 (CATHETERS) IMPLANT
CATH GE 8FR SOUNDSTAR (CATHETERS) IMPLANT
CATH OCTARAY 2.0 F 3-3-3-3-3 (CATHETERS) IMPLANT
CLOSURE PERCLOSE PROSTYLE (VASCULAR PRODUCTS) IMPLANT
COVER SWIFTLINK CONNECTOR (BAG) ×1 IMPLANT
DEVICE CLOSURE MYNXGRIP 6/7F (Vascular Products) IMPLANT
DILATOR VESSEL 38 20CM 16FR (INTRODUCER) IMPLANT
GUIDEWIRE INQWIRE 1.5J.035X260 (WIRE) IMPLANT
KIT VERSACROSS CNCT FARADRIVE (KITS) IMPLANT
MAT PREVALON FULL STRYKER (MISCELLANEOUS) IMPLANT
PACK EP LF (CUSTOM PROCEDURE TRAY) ×1 IMPLANT
PAD DEFIB RADIO PHYSIO CONN (PAD) ×1 IMPLANT
PATCH CARTO3 (PAD) IMPLANT
SHEATH FARADRIVE STEERABLE (SHEATH) IMPLANT
SHEATH INTROD W/O MIN 9FR 25CM (SHEATH) IMPLANT
SHEATH PINNACLE 8F 10CM (SHEATH) IMPLANT
SHEATH PINNACLE 9F 10CM (SHEATH) IMPLANT
SHEATH PROBE COVER 6X72 (BAG) IMPLANT
WIRE AMPLATZ SS-J .035X180CM (WIRE) IMPLANT

## 2023-08-01 NOTE — Discharge Instructions (Signed)

## 2023-08-01 NOTE — Anesthesia Procedure Notes (Signed)
 Procedure Name: Intubation Date/Time: 08/01/2023 7:40 AM  Performed by: Alen Motto D, CRNAPre-anesthesia Checklist: Patient identified, Emergency Drugs available, Suction available and Patient being monitored Patient Re-evaluated:Patient Re-evaluated prior to induction Oxygen Delivery Method: Circle System Utilized Preoxygenation: Pre-oxygenation with 100% oxygen Induction Type: IV induction Ventilation: Mask ventilation without difficulty and Oral airway inserted - appropriate to patient size Laryngoscope Size: Mac and 3 Grade View: Grade II Tube type: Oral Tube size: 7.5 mm Number of attempts: 1 Airway Equipment and Method: Stylet and Oral airway Placement Confirmation: ETT inserted through vocal cords under direct vision, positive ETCO2 and breath sounds checked- equal and bilateral Secured at: 22 cm Tube secured with: Tape Dental Injury: Teeth and Oropharynx as per pre-operative assessment

## 2023-08-01 NOTE — Progress Notes (Signed)
 Patient has a dry cough. Breath sounds clear bilaterally. Denies SOB. C/O sore throat. Light stridor upper airway. O2 sats 100%. Dr. Alveria called; came by to see patient. Orders received.

## 2023-08-01 NOTE — Transfer of Care (Signed)
 Immediate Anesthesia Transfer of Care Note  Patient: Silvano [name]  Procedure(s) Performed: ATRIAL FIBRILLATION ABLATION  Patient Location: PACU  Anesthesia Type:General  Level of Consciousness: awake, alert , and oriented  Airway & Oxygen Therapy: Patient Spontanous Breathing  Post-op Assessment: Report given to RN and Post -op Vital signs reviewed and stable  Post vital signs: Reviewed and stable  Last Vitals:  Vitals Value Taken Time  BP    Temp    Pulse 64 08/01/23 09:50  Resp 15 08/01/23 09:50  SpO2 100 % 08/01/23 09:50  Vitals shown include unfiled device data.  Last Pain:  Vitals:   08/01/23 0622  TempSrc:   PainSc: 0-No pain         Complications: No notable events documented.

## 2023-08-01 NOTE — Anesthesia Preprocedure Evaluation (Signed)
 Anesthesia Evaluation  Patient identified by MRN, date of birth, ID band Patient awake    Reviewed: Allergy & Precautions, NPO status , Patient's Chart, lab work & pertinent test results  Airway Mallampati: II  TM Distance: >3 FB Neck ROM: Full    Dental no notable dental hx.    Pulmonary sleep apnea , former smoker   Pulmonary exam normal        Cardiovascular hypertension, +CHF  + dysrhythmias Atrial Fibrillation + pacemaker  Rhythm:Regular Rate:Normal     Neuro/Psych negative neurological ROS  negative psych ROS   GI/Hepatic Neg liver ROS,GERD  ,,  Endo/Other  negative endocrine ROS    Renal/GU negative Renal ROS  negative genitourinary   Musculoskeletal negative musculoskeletal ROS (+)    Abdominal Normal abdominal exam  (+)   Peds  Hematology Lab Results      Component                Value               Date                      WBC                      5.2                 07/25/2023                HGB                      13.2                07/25/2023                HCT                      39.9                07/25/2023                MCV                      90.9                07/25/2023                PLT                      244.0               07/25/2023             Lab Results      Component                Value               Date                      NA                       142                 07/25/2023                K  4.4                 07/25/2023                CO2                      26                  07/25/2023                GLUCOSE                  91                  07/25/2023                BUN                      13                  07/25/2023                CREATININE               0.72                07/25/2023                CALCIUM                   9.2                 07/25/2023                GFR                      81.69               07/25/2023                 EGFR                     92                  07/19/2022                GFRNONAA                 >60                 12/02/2022              Anesthesia Other Findings   Reproductive/Obstetrics                              Anesthesia Physical Anesthesia Plan  ASA: 3  Anesthesia Plan: General   Post-op Pain Management:    Induction: Intravenous  PONV Risk Score and Plan: 3 and Dexamethasone  and Treatment may vary due to age or medical condition  Airway Management Planned: Mask and Oral ETT  Additional Equipment: None  Intra-op Plan:   Post-operative Plan: Extubation in OR  Informed Consent: I have reviewed the patients History and Physical, chart, labs and discussed the procedure including the risks, benefits and alternatives for the proposed anesthesia with the patient or authorized representative who has indicated his/her understanding and acceptance.     Dental advisory given  Plan Discussed with:  CRNA  Anesthesia Plan Comments:         Anesthesia Quick Evaluation

## 2023-08-01 NOTE — Progress Notes (Signed)
RT here to administer breathing treatment

## 2023-08-01 NOTE — Anesthesia Postprocedure Evaluation (Signed)
 Anesthesia Post Note  Patient: Veronica Bradford  Procedure(s) Performed: ATRIAL FIBRILLATION ABLATION     Patient location during evaluation: PACU Anesthesia Type: General Level of consciousness: awake and alert Pain management: pain level controlled Vital Signs Assessment: post-procedure vital signs reviewed and stable Respiratory status: spontaneous breathing, nonlabored ventilation, respiratory function stable and patient connected to nasal cannula oxygen Cardiovascular status: blood pressure returned to baseline and stable Postop Assessment: no apparent nausea or vomiting Anesthetic complications: no   No notable events documented.  Last Vitals:  Vitals:   08/01/23 1300 08/01/23 1330  BP: (!) 107/43 (!) 100/51  Pulse: 75 74  Resp: 14 (!) 21  Temp:    SpO2: 94% 94%    Last Pain:  Vitals:   08/01/23 1210  TempSrc:   PainSc: 1                  Leahmarie Gasiorowski P Sheli Dorin

## 2023-08-01 NOTE — Progress Notes (Signed)
 Patient ambulated to BR. Both groins are unremarkable.

## 2023-08-01 NOTE — H&P (Signed)
 Electrophysiology Note:   Date:  08/01/23  ID:  [name], DOB 04/15/1945, MRN [id number]   Primary Cardiologist: Elspeth Sage, MD Electrophysiologist: Elspeth Sage, MD       History of Present Illness:   Veronica Bradford is a 78 y.o. 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 Sage.   Discussed the use of AI scribe software for clinical note transcription with the patient, who gave verbal consent to proceed.   History of Present Illness Veronica Bradford is a 78 year old female with atrial fibrillation who presents for evaluation of recurrent episodes despite medication. She was referred by Dr. Sage for evaluation of her atrial fibrillation.   The patient has a history of atrial fibrillation and is currently on dofetilide . Despite this medication, she continues to experience episodes of atrial fibrillation approximately every three to four weeks, sometimes more frequently. Symptoms include pulsatile tinnitus and abrupt changes in heart rate, often waking her at night. She uses a Kardia mobile device and a watch to monitor her pulse, noting irregularities that coincide with her symptoms. Episodes predominantly occur at night, between 3 and 7 AM, but can occasionally happen during the day. These episodes significantly impact her daily life, lasting up to nine hours and leaving her fatigued and unable to perform daily activities. Prior to starting dofetilide , episodes lasted two to three days.   In 2019, she underwent an ablation procedure, resulting in a difficult recovery. She experienced rapid atrial fibrillation post-surgery, chest burning for six weeks, and new arrhythmias. During a follow-up visit, she was informed of atrial flutter. She describes the recovery as 'rough,' with significant discomfort  and prolonged symptoms.    Interval: Patient presents today for scheduled ablation. She reports most recent AF episode was 1 week ago, lasting 2 hours. Otherwise doing relatively well aside from some intermittent back pain. No new or acute complaints today.   Review of systems complete and found to be negative unless listed in HPI.    EP Information / Studies Reviewed:     EKG is not ordered today. EKG from 03/27/23 reviewed which showed atrial paced rhythm.        Echo 02/11/2021: 1. Left ventricular ejection fraction, by estimation, is 65 to 70%. The  left ventricle has normal function. The left ventricle has no regional  wall motion abnormalities. There is moderate asymmetric left ventricular  hypertrophy of the basal-septal  segment. Left ventricular diastolic parameters are indeterminate.   2. Right ventricular systolic function is normal. The right ventricular  size is mildly enlarged. There is normal pulmonary artery systolic  pressure. The estimated right ventricular systolic pressure is 30.2 mmHg.   3. The mitral valve is normal in structure. Trivial mitral valve  regurgitation. No evidence of mitral stenosis.   4. The aortic valve is tricuspid. Aortic valve regurgitation is not  visualized. No aortic stenosis is present.   5. The inferior vena cava is normal in size with greater than 50%  respiratory variability, suggesting right atrial pressure of 3 mmHg.    Risk Assessment/Calculations:     CHA2DS2-VASc Score = 5   This indicates a 7.2% annual risk of stroke. The patient's score is based upon: CHF History: 1 HTN History: 1 Diabetes History: 0 Stroke History: 0 Vascular Disease History: 1 Age Score: 1 Gender  Score: 1               Physical Exam:    Today's Vitals   08/01/23 0604 08/01/23 0622  BP: (!) 123/45   Pulse: 69   Resp: 18   Temp: 98.1 F (36.7 C)   TempSrc: Oral   SpO2: 96%   Weight: 115.2 kg   Height: 5' 8 (1.727 m)   PainSc:  0-No pain    Body mass index is 38.62 kg/m.  GEN: Well nourished, well developed in no acute distress NECK: No JVD; No carotid bruits CARDIAC: Normal rate, regular RESPIRATORY:  Clear to auscultation without rales, wheezing or rhonchi  ABDOMEN: Soft, non-tender, non-distended EXTREMITIES: Trace edema; No deformity    ASSESSMENT AND PLAN:     Assessment & Plan # Paroxysmal Atrial Fibrillation: Status post PVI in 2019.  Posterior wall was not ablated.  She continues to have symptomatic episodes despite Tikosyn . # Typical atrial flutter: Status post CTI ablation in 2019. -Discussed treatment options today for AF including antiarrhythmic drug therapy and ablation. Discussed risks, recovery and likelihood of success with each treatment strategy. Risk, benefits, and alternatives to EP study and ablation for afib were discussed. These risks include but are not limited to stroke, bleeding, vascular damage, tamponade, perforation, damage to the esophagus, lungs, phrenic nerve and other structures, pulmonary vein stenosis, worsening renal function, coronary vasospasm and death.  Discussed potential need for repeat ablation procedures and antiarrhythmic drugs after an initial ablation. The patient understands these risk and wishes to proceed today. -Continue dofetilide  250 mcg twice daily. -Continue diltiazem  120 mg once daily.   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 -Continue Xarelto  20 mg once daily.   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   Follow up with Dr. Kennyth 3 months after ablation.   Signed, Fonda Kennyth, MD

## 2023-08-02 ENCOUNTER — Telehealth (HOSPITAL_COMMUNITY): Payer: Self-pay

## 2023-08-02 LAB — POCT ACTIVATED CLOTTING TIME: Activated Clotting Time: 331 s

## 2023-08-02 MED FILL — Fentanyl Citrate Preservative Free (PF) Inj 100 MCG/2ML: INTRAMUSCULAR | Qty: 1 | Status: AC

## 2023-08-02 NOTE — Telephone Encounter (Signed)
 Attempted to reach patient to follow up with procedure completed on 08/01/23, no answer. Left VM for patient to return call.

## 2023-08-06 NOTE — Telephone Encounter (Signed)
 Patient returned call to complete post procedure follow up call.  She reports no complications with groin sites.   Patient states she has been having diarrhea since her ablation and inquires if this could be a reaction from ablation? States she read online that if there was excessive VNS, diarrhea could be a side effect. If so, could it potentially be an issue until treated areas are healed? I also encouraged patient if continues, to follow up with her PCP or Gastroenterologist but she also wanted Dr. Shaune opinion.   Instructions reviewed with patient:  It is normal to have bruising, tenderness, mild swelling, and a pea or marble sized lump/knot at the groin site which can take up to three months to resolve.  Get help right away if you notice sudden swelling at the puncture site.  Check your puncture site every day for signs of infection: fever, redness, swelling, pus drainage, warmth, foul odor or excessive pain. If this occurs, please call the office at [phone], to speak with the nurse. Get help right away if your puncture site is bleeding and the bleeding does not stop after applying firm pressure to the area.  You may continue to have skipped beats/ atrial fibrillation during the first several months after your procedure.  It is very important not to miss any doses of your blood thinner Xarelto .    You will follow up with the Afib clinic on 08/29/23 and follow up with the APP on 11/01/23.   Patient verbalized understanding to all instructions provided.

## 2023-08-06 NOTE — Telephone Encounter (Signed)
 Do not suspect that is secondary to ablation per Dr. Kennyth. Made patient aware of provider feedback. She voiced understanding and appreciation for the follow up.

## 2023-08-08 ENCOUNTER — Telehealth: Payer: Self-pay

## 2023-08-08 ENCOUNTER — Other Ambulatory Visit (HOSPITAL_COMMUNITY): Payer: Self-pay

## 2023-08-08 ENCOUNTER — Encounter: Payer: Self-pay | Admitting: Internal Medicine

## 2023-08-08 MED ORDER — ZEPBOUND 2.5 MG/0.5ML ~~LOC~~ SOAJ: 2.5000 mg | SUBCUTANEOUS | 0 refills | Status: DC

## 2023-08-08 NOTE — Telephone Encounter (Signed)
 Will need PA for Zepbound - for OSA please.

## 2023-08-08 NOTE — Telephone Encounter (Signed)
 Send Zepbound  2.5 mg 1 injection weekly 1 month supply. Dx OSA.

## 2023-08-08 NOTE — Telephone Encounter (Signed)
 PA request has been Submitted. New Encounter has been or will be created for follow up. For additional info see Pharmacy Prior Auth telephone encounter from 08/08/23.

## 2023-08-08 NOTE — Telephone Encounter (Signed)
 Pharmacy Patient Advocate Encounter   Received notification from Pt Calls Messages that prior authorization for Zepbound  2.5MG /0.5ML pen-injectors is required/requested.   Insurance verification completed.   The patient is insured through [facility] .   Per test claim: PA required; PA submitted to above mentioned insurance via CoverMyMeds Key/confirmation #/EOC [id number] Status is pending

## 2023-08-08 NOTE — Telephone Encounter (Signed)
 Rx sent.

## 2023-08-08 NOTE — Addendum Note (Signed)
 Addended by: Carlen Rebuck D on: 08/08/2023 03:42 PM   Modules accepted: Orders

## 2023-08-09 NOTE — Telephone Encounter (Signed)
 Why wasn't sleep study results submitted? They are in chart. Please appeal.

## 2023-08-09 NOTE — Telephone Encounter (Signed)
 Pharmacy Patient Advocate Encounter  Received notification from [employer] that Prior Authorization for Zepbound  2.5MG /0.5ML pen-injectors  has been DENIED.  Full denial letter will be uploaded to the media tab. See denial reason below.   PA #/Case ID/Reference #: [id number]

## 2023-08-09 NOTE — Telephone Encounter (Signed)
 Information has been sent to clinical pharmacist for appeals review. It may take 5-7 days to prepare the necessary documentation to request the appeal from the insurance.

## 2023-08-09 NOTE — Telephone Encounter (Addendum)
 Sleep study was submitted with PA. Only documented sleep study was from 2015. Per sleep study, patient has mild sleep apnea. Insurance requires moderate to severe sleep apnea.

## 2023-08-09 NOTE — Telephone Encounter (Signed)
 See note from pulmonary 07/05/2023:  Obstructive Sleep Apnea (OSA) OSA diagnosed in 2015 with a home sleep study showing moderate severity (20+ events per hour). Maintained on CPAP therapy with good compliance.  Please appeal.  You could send the pulmonary OV note.  If denied again my guess is the only option would be to repeat her sleep study.

## 2023-08-10 ENCOUNTER — Telehealth: Payer: Self-pay | Admitting: Pharmacist

## 2023-08-10 NOTE — Telephone Encounter (Signed)
 Appeal has been submitted for Zepbound . Will advise when response is received, please be advised that most companies may take 30 days to make a decision. Appeal letter and supporting documentation have been faxed to [phone] on 08/09/2023 @8 :39 am.  Thank you, Devere Pandy, PharmD Clinical Pharmacist  [place]  Direct Dial: [phone]

## 2023-08-12 ENCOUNTER — Telehealth: Payer: Self-pay | Admitting: Student in an Organized Health Care Education/Training Program

## 2023-08-12 ENCOUNTER — Encounter: Payer: Self-pay | Admitting: Cardiology

## 2023-08-12 NOTE — Telephone Encounter (Signed)
 Pt called to report palpitations. She has a known history of Afib and self reports being very aware of every heart beat. She underwent PVI ~2 weeks ago. Tonight she has had a few episodes of palpitations that are transient. She took her pulse and denies having any tachycardia. She checked her heart rhythm on her kardia mobile and it read Afib; however, on her own review she thinks it was just PVCs. She denies chest pain, SOB, syncope, presyncope. No symptoms apart from palpitations. Not currently having active palpitations either. I reassured her that her symptoms are not life threatening. I instructed her to contact her primary EP this week for possible remote interrogation of her PPM to see what her arrhythmia burden is. I further counseled her on precautions for ED presentation. She verbalized an understanding.

## 2023-08-13 ENCOUNTER — Encounter: Payer: Self-pay | Admitting: Emergency Medicine

## 2023-08-13 ENCOUNTER — Telehealth: Payer: Self-pay

## 2023-08-13 NOTE — Telephone Encounter (Signed)
 Pt called in wanting to know if someone can review it and let her know what was seen

## 2023-08-13 NOTE — Telephone Encounter (Signed)
 Remote transmission sent over the weekend, presenting rhythm was bigeminy/NSR. No other alerts noted.   Please see mychart patient message for symptoms and medication questions.

## 2023-08-13 NOTE — Telephone Encounter (Signed)
 Returned call to patient regarding review of manual transmission sent d/t palpitations. After review, no abnormal rhythms and no atrial fibrillation detected. Patient informed Dr. Kennyth sent message through MyChart on 8/11 stating symptoms are d/t PACs, sometimes in a pattern of bigeminy and require no treatment by themselves. Only require treatment in need of symptom management. Patient read message while on the phone and verbalized understanding. States medication compliance with both Tikosyn  & Xarelto . Patient will continue to monitor symptoms at this time and inform device clinic if symptoms become worse. Patient made aware of upcoming F/U appointment with AF clinic on 8/27.   Informed to call device clinic back if symptoms worsen or for any other questions or concerns.

## 2023-08-15 NOTE — Addendum Note (Signed)
 Addended by: VICCI SELLER A on: 08/15/2023 10:40 AM   Modules accepted: Orders

## 2023-08-15 NOTE — Progress Notes (Signed)
 Remote pacemaker transmission.

## 2023-08-21 DIAGNOSIS — H903 Sensorineural hearing loss, bilateral: Secondary | ICD-10-CM | POA: Diagnosis not present

## 2023-08-29 ENCOUNTER — Ambulatory Visit (INDEPENDENT_AMBULATORY_CARE_PROVIDER_SITE_OTHER): Admitting: Obstetrics and Gynecology

## 2023-08-29 ENCOUNTER — Encounter (HOSPITAL_COMMUNITY): Payer: Self-pay | Admitting: Internal Medicine

## 2023-08-29 ENCOUNTER — Ambulatory Visit (HOSPITAL_COMMUNITY)
Admit: 2023-08-29 | Discharge: 2023-08-29 | Disposition: A | Discharge status: Home / Self Care | Attending: Internal Medicine | Admitting: Internal Medicine

## 2023-08-29 VITALS — BP 124/60 | HR 67 | Ht 68.0 in | Wt 249.6 lb

## 2023-08-29 VITALS — BP 129/47 | HR 63 | Ht 67.5 in | Wt 249.0 lb

## 2023-08-29 DIAGNOSIS — I480 Paroxysmal atrial fibrillation: Secondary | ICD-10-CM

## 2023-08-29 DIAGNOSIS — Z5181 Encounter for therapeutic drug level monitoring: Secondary | ICD-10-CM

## 2023-08-29 DIAGNOSIS — Z79899 Other long term (current) drug therapy: Secondary | ICD-10-CM | POA: Diagnosis not present

## 2023-08-29 DIAGNOSIS — Z01419 Encounter for gynecological examination (general) (routine) without abnormal findings: Secondary | ICD-10-CM | POA: Diagnosis not present

## 2023-08-29 DIAGNOSIS — D6869 Other thrombophilia: Secondary | ICD-10-CM

## 2023-08-29 NOTE — Progress Notes (Signed)
 ANNUAL GYNECOLOGY VISIT No chief complaint on file.    Subjective:  Veronica Bradford is a 78 y.o. [phone] who presents for annual exam.  No concerns.  History of uterine cancer. No chemo/radiation. S/p hysterectomy No issues since Uses vaginal estrace  for recurrent UTIs  Last pap: No results found for: DIAGPAP, HPV, HPVHIGH History of abnormal pap: No Last mammogram: 03/2023 Last colonoscopy: 2022 Last DEXA: 2024   The pregnancy intention screening data noted above was reviewed. Potential methods of contraception were discussed. The patient elected to proceed with No data recorded.       07/25/2023   10:21 AM 06/27/2023    2:23 PM 03/07/2023    1:15 PM 12/06/2022   11:09 AM 07/24/2022   10:19 AM  Depression screen PHQ 2/9  Decreased Interest 0 0 0 0 0  Down, Depressed, Hopeless 0 0 0 0 0  PHQ - 2 Score 0 0 0 0 0         No data to display            OB History     Gravida  4   Para  1   Term  1   Preterm      AB  3   Living  1      SAB  2   IAB      Ectopic  1   Multiple      Live Births  1           Past Medical History:  Diagnosis Date   Atrial fibrillation (HCC)    Atrial fibrillation (HCC)    Back pain    CHF (congestive heart failure) (HCC)    grade 2 HF   Chronic sinusitis    s/p 2 surgeries remotely, Dr Ethyl   Closed fracture of metatarsal of left foot    L foot, fifth metatarsal   Complication of anesthesia    woke up once during procedure   Dysrhythmia    a-fib   Ectopic pregnancy    Endometrial cancer (HCC) 2004   Early diagnosis   GERD (gastroesophageal reflux disease)    Hyperlipemia    Hypertension    IBS (irritable bowel syndrome) 08/13/2014   Dx 2015, diarrhea on-off, Dr Donnald    Lumbar radiculopathy 08/2012   MRI in 08/2012   Mild hyperparathyroidism (HCC)    Osteopenia    PAF (paroxysmal atrial fibrillation) (HCC)    Presence of permanent cardiac pacemaker    Sciatica of right side 08/2013    Received Depomedrol 80 mg injection   Sleep apnea 09/09/2013   Dx with OSA in 2015, by Dr. Fernande,   wears CPAPAPAP    Past Surgical History:  Procedure Laterality Date   ATRIAL FIBRILLATION ABLATION N/A 09/04/2017   Procedure: ATRIAL FIBRILLATION ABLATION;  Surgeon: Kelsie Agent, MD;  Location: [facility] INVASIVE [facility];  Service: Cardiovascular;  Laterality: N/A;   ATRIAL FIBRILLATION ABLATION N/A 08/01/2023   Procedure: ATRIAL FIBRILLATION ABLATION;  Surgeon: Kennyth Chew, MD;  Location: [facility] INVASIVE CV LAB;  Service: Cardiovascular;  Laterality: N/A;   BIOPSY  03/11/2020   Procedure: BIOPSY;  Surgeon: Donnald Charleston, MD;  Location: [facility] ENDOS[facility];  Service: Endoscopy;;  EGD and COLON   CARPAL TUNNEL RELEASE Right 06/17/2019   CARPAL TUNNEL RELEASE Left 11/2020   CHOLECYSTECTOMY  ~2006   COLONOSCOPY WITH PROPOFOL  N/A 03/11/2020   Procedure: COLONOSCOPY WITH PROPOFOL ;  Surgeon: Donnald Charleston, MD;  Location: [facility];  Service: Endoscopy;  Laterality: N/A;   DILATION AND CURETTAGE OF UTERUS     after SAB   ESOPHAGOGASTRODUODENOSCOPY  02/09/2014   Dr. Donnald   ESOPHAGOGASTRODUODENOSCOPY (EGD) WITH PROPOFOL  N/A 03/11/2020   Procedure: ESOPHAGOGASTRODUODENOSCOPY (EGD) WITH PROPOFOL ;  Surgeon: Donnald Charleston, MD;  Location: [facility]OS[facility];  Service: Endoscopy;  Laterality: N/A;   FINGER SURGERY Left    index   NASAL SINUS SURGERY     x 2 remotely, Dr Ethyl   PACEMAKER IMPLANT N/A 09/13/2020   Procedure: PACEMAKER IMPLANT;  Surgeon: Fernande Elspeth BROCKS, MD;  Location: [facility] INVASIVE CV LAB;  Service: Cardiovascular;  Laterality: N/A;   PARATHYROIDECTOMY Left 09/14/2022   Procedure: LEFT PARATHYROIDECTOMY;  Surgeon: Eletha Boas, MD;  Location: [facility];  Service: General;  Laterality: Left;   TONSILLECTOMY     TOTAL ABDOMINAL HYSTERECTOMY W/ BILATERAL SALPINGOOPHORECTOMY     TOTAL HIP ARTHROPLASTY Left 07/18/2021   Procedure: TOTAL HIP ARTHROPLASTY ANTERIOR APPROACH;  Surgeon: Fidel Rogue,  MD;  Location: [facility];  Service: Orthopedics;  Laterality: Left;   TOTAL KNEE ARTHROPLASTY Right ~2008    Social History   Socioeconomic History   Marital status: Married    Spouse name: Onetha Gaffey   Number of children: 1   Years of education: Not on file   Highest education level: Associate degree: occupational, Scientist, product/process development, or vocational program  Occupational History   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  Tobacco Use   Smoking status: Former    Current packs/day: 0.00    Types: Cigarettes    Quit date: 01/06/1980    Years since quitting: 43.6   Smokeless tobacco: Never   Tobacco comments:    smoked from 1970 to 1982, less than 1 ppd  Vaping Use   Vaping status: Never Used  Substance and Sexual Activity   Alcohol  use: Yes    Alcohol /week: 1.0 standard drink of alcohol     Types: 1 Glasses of wine per week    Comment: rare   Drug use: Never   Sexual activity: Not Currently    Birth control/protection: None  Other Topics Concern   Not on file  Social History Narrative   Lives w/ husband, and son Marolyn   Retired Engineer, civil (consulting)   Lives in [place]   Social Drivers of Health   Financial Resource Strain: Low Risk  (07/23/2023)   Overall Financial Resource Strain (CARDIA)    Difficulty of Paying Living Expenses: Not hard at all  Food Insecurity: No Food Insecurity (07/23/2023)   Hunger Vital Sign    Worried About Running Out of Food in the Last Year: Never true    Ran Out of Food in the Last Year: Never true  Transportation Needs: No Transportation Needs (07/23/2023)   PRAPARE - Administrator, Civil Service (Medical): No    Lack of Transportation (Non-Medical): No  Physical Activity: Insufficiently Active (07/23/2023)   Exercise Vital Sign    Days of Exercise per Week: 4 days    Minutes of Exercise per Session: 20 min  Stress: No Stress Concern Present (07/23/2023)   [employer] of Occupational Health - Occupational Stress Questionnaire    Feeling of Stress: Only a  little  Social Connections: Moderately Integrated (07/23/2023)   Social Connection and Isolation Panel    Frequency of Communication with Friends and Family: More than three times a week    Frequency of Social Gatherings with Friends and Family: More than three times a week    Attends Religious Services: 1  to 4 times per year    Active Member of Clubs or Organizations: No    Attends Banker Meetings: Not on file    Marital Status: Married  Recent Concern: Social Connections - Socially Isolated (06/27/2023)   Social Connection and Isolation Panel    Frequency of Communication with Friends and Family: More than three times a week    Frequency of Social Gatherings with Friends and Family: More than three times a week    Attends Religious Services: Never    Database administrator or Organizations: No    Attends Banker Meetings: Never    Marital Status: Widowed    Family History  Problem Relation Age of Onset   Hypertension Mother    Colon cancer Mother    Breast cancer Mother    Ovarian cancer Mother    Hypertension Father    Diabetes Father    Head & neck cancer Sister    CAD Neg Hx     Current Outpatient Medications on File Prior to Visit  Medication Sig Dispense Refill   acetaminophen  (TYLENOL ) 500 MG tablet Take 500 mg by mouth every 6 (six) hours as needed for moderate pain or headache.     acetaminophen -codeine  (TYLENOL  #3) 300-30 MG tablet Take 1 tablet by mouth at bedtime as needed for moderate pain (pain score 4-6). 30 tablet 0   ARTIFICIAL TEAR SOLUTION OP Place 1 drop into both eyes 4 (four) times daily as needed (dry/irritated eyes.).     ascorbic acid (VITAMIN C) 500 MG tablet Take 500 mg by mouth 2 (two) times daily. Take with Iron     B Complex-C (SUPER B COMPLEX/VITAMIN C PO) Take 1 tablet by mouth daily.     Cholecalciferol  (VITAMIN D3) 50 MCG (2000 UT) capsule Take 2,000 Units by mouth daily.     Coenzyme Q10 (COQ10) 200 MG CAPS Take 200  mg by mouth daily.     colestipol (COLESTID) 1 g tablet Take 2 g by mouth 2 (two) times daily.     CRANBERRY EXTRACT PO Take 1 tablet by mouth 2 (two) times daily.     diltiazem  (CARDIZEM  CD) 120 MG 24 hr capsule Take 1 capsule (120 mg total) by mouth daily. 90 capsule 3   diltiazem  (CARDIZEM ) 30 MG tablet Take 1 tablet every 4 hours AS NEEDED for heart rate >100 as long as blood pressure >100. 120 tablet 11   diphenoxylate-atropine  (LOMOTIL) 2.5-0.025 MG tablet Take 1 tablet by mouth 4 (four) times daily as needed for diarrhea or loose stools.     dofetilide  (TIKOSYN ) 250 MCG capsule TAKE 1 CAPSULE BY MOUTH 2 TIMES DAILY. 180 capsule 2   estradiol  (ESTRACE ) 0.1 MG/GM vaginal cream Place 1 Applicatorful vaginally 3 (three) times a week. 42.5 g 12   ferrous fumarate  (HEMOCYTE - 106 MG FE) 325 (106 Fe) MG TABS tablet Take 1 tablet (106 mg of iron total) by mouth 2 (two) times daily. Take apart from pantoprazole  180 tablet 1   fluticasone  (FLONASE ) 50 MCG/ACT nasal spray Place 1 spray into both nostrils daily.     furosemide  (LASIX ) 40 MG tablet TAKE 1 TABLET BY MOUTH AS NEEDED 90 tablet 3   MAGNESIUM  GLYCINATE PO Take 300 mg by mouth daily as needed (upset stomach).     olopatadine  (PATANOL) 0.1 % ophthalmic solution Place 1 drop into both eyes daily as needed (allergies).     pantoprazole  (PROTONIX ) 40 MG tablet Take 1 tablet (40  mg total) by mouth 2 (two) times daily.     potassium chloride  SA (KLOR-CON  M) 20 MEQ tablet TAKE 1 TABLET(20 MEQ) BY MOUTH DAILY 90 tablet 2   pravastatin  (PRAVACHOL ) 40 MG tablet Take 1 tablet (40 mg total) by mouth every evening. 90 tablet 3   spironolactone  (ALDACTONE ) 25 MG tablet TAKE 1 TABLET(25 MG) BY MOUTH DAILY 90 tablet 3   sucralfate  (CARAFATE ) 1 g tablet Take 1 g by mouth 2 (two) times daily as needed (upset stomach).     tirzepatide  (ZEPBOUND ) 2.5 MG/0.5ML Pen Inject 2.5 mg into the skin once a week. 2 mL 0   valsartan  (DIOVAN ) 40 MG tablet Take 1 tablet (40  mg total) by mouth daily. 90 tablet 3   Vitamin D , Ergocalciferol , (DRISDOL ) 1.25 MG (50000 UNIT) CAPS capsule Take 1 capsule (50,000 Units total) by mouth every 7 (seven) days. 12 capsule 0   XARELTO  20 MG TABS tablet TAKE 1 TABLET(20 MG) BY MOUTH DAILY WITH SUPPER 90 tablet 1   No current facility-administered medications on file prior to visit.    Allergies  Allergen Reactions   Ibuprofen Other (See Comments)    Pt is on Xarelto    Lactose Intolerance (Gi) Diarrhea and Nausea And Vomiting   Lisinopril Cough   Nsaids Other (See Comments)    Pt on Xarelto    Tape     Causes blisters    Tolmetin Other (See Comments)    Pt on Xarelto    Zoster Vaccine Live Other (See Comments)    Serum sickness, high fever, neck pain    Amoxicillin Rash   Penicillins Rash    Local reaction     Objective:   Vitals:   08/29/23 1620  BP: (!) 129/47  Pulse: 63  Weight: 249 lb (112.9 kg)  Height: 5' 7.5 (1.715 m)   Physical Examination:   General appearance - well appearing, and in no distress  Mental status - alert, oriented to person, place, and time  Psych:  normal mood and affect  Skin - warm and dry, normal color, no suspicious lesions noted  Chest - effort normal, all lung fields clear to auscultation bilaterally  Heart - normal rate and regular rhythm  Neck:  midline trachea, no thyromegaly or nodules  Breasts - breasts appear normal, no suspicious masses, no skin or nipple changes or  axillary nodes  Abdomen - soft, nontender, nondistended, no masses or organomegaly  Pelvic -  VULVA: normal appearing vulva with no masses, tenderness or lesions   VAGINA: normal appearing vagina with normal color and discharge, no lesions   CERVIX: surgically absent  UTERUS: surgically absent  ADNEXA: surgically absent  Extremities:  No swelling or varicosities noted  Chaperone present for exam  Assessment and Plan:  1. Well woman exam with routine gynecological exam (Primary) Doing well No  pap indicated Mammo, colonoscopy, DEXA up to date     No follow-ups on file.  Future Appointments  Date Time Provider Department Center  09/11/2023  7:00 AM [facility] DEVICE REMOTES [facility]  10/08/2023  2:00 PM Amon Aloysius BRAVO, MD [facility] [id number]  11/06/2023  2:00 PM Kennyth Chew, MD [facility]  12/11/2023  7:00 AM [facility] DEVICE REMOTES [facility]  03/11/2024  7:00 AM [facility] DEVICE REMOTES [facility]  06/10/2024  7:00 AM [facility] DEVICE REMOTES [facility]  07/01/2024  1:40 PM [facility] ANNUAL WELLNESS VISIT 1 [facility] [address]  09/09/2024  7:05 AM [facility] DEVICE  REMOTES [facility]    Rollo ONEIDA Bring, MD, FACOG Obstetrician & Gynecologist, [facility], [facility]

## 2023-08-29 NOTE — Progress Notes (Addendum)
 Primary Care Physician: Amon Aloysius BRAVO, MD Referring Physician: EP: Dr. Kennyth Silvano DELENA Veronica Bradford is a 77 y.o. female with a h/o PPM, CHF, HTN, CAD, Afib and PVC's that is in the afib clinic as she was in the ED waiting room and was not going to be seen by the provider for several hours so decided to leave  to be seen here this am for BP issues and afib.   She reports that she was in her usual state of health until last Friday when she had a tooth pulled. She had to hold her xarelto  Thursday pm.  She is on xarelto  20 mg daily for a CHA2DS2VASc  score of at least 5.  She was started on clindamycin after the tooth extraction. Her xarelto  was started back Friday pm. Her oral intake has been off since the tooth extraction. She was walking the dog Monday pm around 3:30 pm and noted that she felt off. She checked her heart rhythm and it was irregular with rates in the 80's. She described some chest heaviness on arrival to the ED to triage nurse but told me it has now resolved. Ekg in the ED showed afib with RBBB at 84 bpm.   Usually has  heart rates around 60's. She took extra cardizem  and her heart rates for the most part have been less than 100 bpm. This am around 6:30 she awoke and took her BP. It was around 240/122. She rechecked with another BP cuff with similar results. She took her usual  am meds and called 911. By the time EMS responded her BP was 180 then 160 systolic and had normalized on arrival to the ER. She then got tired of the wait and asked to be seen this am as she had already made an appointment today for 9:30 a to discuss ongoing afib so checked herself out of the ED.labs form ED showed a k+ of 3.2 otherwise ok. HR irregular in the afib clinic in the 70's. Ekg was not repeated from  ED.  She was scheduled for a cardioversion and in a few days called in and she had returned to SR so DCCV was cancelled. She then called the office early December again out of rhythm and discussion was had to admit  pt for tikosyn  after the holidays. She is now here for admit and EKG today shows  SR. She did have afib briefly Christmas Eve but converted with an extra 120 mg cardizem . She is in rhythm today. She stopped flecainide  and HCTZ last Friday. No benadryl  use. No missed anticoagulation.   F/u in afib clinic, 01/16/22 for post hospital  Tikosyn  surveillance. She has been staying in SR. Her qt interval is stable at 462 ms. She was placed on spironolactone  as it was difficult  keeping her K+ at good levels in the hospital. She has still required some doses of lasix  when her weight jumps up a few lbs in a day or two. She has had to reduce her valsartan  to 160 mg daily as her BP has been softer since d/c.   On follow up 08/29/23, patient is currently in NSR. S/p Afib ablation on 08/01/23 by Dr. Kennyth. No episodes of Afib since ablation. Patient is taking Tikosyn  250 mcg BID. Device interrogation by Dr. Kennyth on 8/11 showed no sustained abnormal arrhythmias. She began diltiazem  120 mg daily due to feeling uncomfortable from PACs. No chest pain or SOB. Leg sites healed without issue. No missed doses  of anticoagulant.  Today, she denies symptoms of orthopnea, PND, lower extremity edema, dizziness, presyncope, syncope, snoring, daytime somnolence, bleeding, or neurologic sequela. The patient is tolerating medications without difficulties and is otherwise without complaint today.   Past Medical History:  Diagnosis Date   Atrial fibrillation (HCC)    Atrial fibrillation (HCC)    Back pain    CHF (congestive heart failure) (HCC)    grade 2 HF   Chronic sinusitis    s/p 2 surgeries remotely, Dr Ethyl   Closed fracture of metatarsal of left foot    L foot, fifth metatarsal   Complication of anesthesia    woke up once during procedure   Dysrhythmia    a-fib   Ectopic pregnancy    Endometrial cancer (HCC) 2004   Early diagnosis   GERD (gastroesophageal reflux disease)    Hyperlipemia    Hypertension    IBS  (irritable bowel syndrome) 08/13/2014   Dx 2015, diarrhea on-off, Dr Donnald    Lumbar radiculopathy 08/2012   MRI in 08/2012   Mild hyperparathyroidism (HCC)    Osteopenia    PAF (paroxysmal atrial fibrillation) (HCC)    Presence of permanent cardiac pacemaker    Sciatica of right side 08/2013   Received Depomedrol 80 mg injection   Sleep apnea 09/09/2013   Dx with OSA in 2015, by Dr. Fernande,   wears CPAPAPAP   Past Surgical History:  Procedure Laterality Date   ATRIAL FIBRILLATION ABLATION N/A 09/04/2017   Procedure: ATRIAL FIBRILLATION ABLATION;  Surgeon: Kelsie Agent, MD;  Location: [facility] INVASIVE CV LAB;  Service: Cardiovascular;  Laterality: N/A;   ATRIAL FIBRILLATION ABLATION N/A 08/01/2023   Procedure: ATRIAL FIBRILLATION ABLATION;  Surgeon: Kennyth Chew, MD;  Location: [facility] INVASIVE CV LAB;  Service: Cardiovascular;  Laterality: N/A;   BIOPSY  03/11/2020   Procedure: BIOPSY;  Surgeon: Donnald Charleston, MD;  Location: [facility] ENDOSCOPY;  Service: Endoscopy;;  EGD and COLON   CARPAL TUNNEL RELEASE Right 06/17/2019   CARPAL TUNNEL RELEASE Left 11/2020   CHOLECYSTECTOMY  ~2006   COLONOSCOPY WITH PROPOFOL  N/A 03/11/2020   Procedure: COLONOSCOPY WITH PROPOFOL ;  Surgeon: Donnald Charleston, MD;  Location: [facility] ENDOSCOPY;  Service: Endoscopy;  Laterality: N/A;   DILATION AND CURETTAGE OF UTERUS     after SAB   ESOPHAGOGASTRODUODENOSCOPY  02/09/2014   Dr. Donnald   ESOPHAGOGASTRODUODENOSCOPY (EGD) WITH PROPOFOL  N/A 03/11/2020   Procedure: ESOPHAGOGASTRODUODENOSCOPY (EGD) WITH PROPOFOL ;  Surgeon: Donnald Charleston, MD;  Location: [facility] ENDOSCOPY;  Service: Endoscopy;  Laterality: N/A;   FINGER SURGERY Left    index   NASAL SINUS SURGERY     x 2 remotely, Dr Ethyl   PACEMAKER IMPLANT N/A 09/13/2020   Procedure: PACEMAKER IMPLANT;  Surgeon: Fernande Elspeth BROCKS, MD;  Location: [facility] INVASIVE CV LAB;  Service: Cardiovascular;  Laterality: N/A;   PARATHYROIDECTOMY Left 09/14/2022   Procedure: LEFT  PARATHYROIDECTOMY;  Surgeon: Eletha Boas, MD;  Location: [facility] ORS;  Service: General;  Laterality: Left;   TONSILLECTOMY     TOTAL ABDOMINAL HYSTERECTOMY W/ BILATERAL SALPINGOOPHORECTOMY     TOTAL HIP ARTHROPLASTY Left 07/18/2021   Procedure: TOTAL HIP ARTHROPLASTY ANTERIOR APPROACH;  Surgeon: Fidel Rogue, MD;  Location: [facility] ORS;  Service: Orthopedics;  Laterality: Left;   TOTAL KNEE ARTHROPLASTY Right ~2008    Current Outpatient Medications  Medication Sig Dispense Refill   acetaminophen  (TYLENOL ) 500 MG tablet Take 500 mg by mouth every 6 (six) hours as needed for moderate pain or headache.  acetaminophen -codeine  (TYLENOL  #3) 300-30 MG tablet Take 1 tablet by mouth at bedtime as needed for moderate pain (pain score 4-6). 30 tablet 0   ARTIFICIAL TEAR SOLUTION OP Place 1 drop into both eyes 4 (four) times daily as needed (dry/irritated eyes.).     ascorbic acid (VITAMIN C) 500 MG tablet Take 500 mg by mouth 2 (two) times daily. Take with Iron     B Complex-C (SUPER B COMPLEX/VITAMIN C PO) Take 1 tablet by mouth daily.     Cholecalciferol  (VITAMIN D3) 50 MCG (2000 UT) capsule Take 2,000 Units by mouth daily.     Coenzyme Q10 (COQ10) 200 MG CAPS Take 200 mg by mouth daily.     colestipol (COLESTID) 1 g tablet Take 2 g by mouth 2 (two) times daily.     CRANBERRY EXTRACT PO Take 1 tablet by mouth 2 (two) times daily.     diltiazem  (CARDIZEM  CD) 120 MG 24 hr capsule Take 1 capsule (120 mg total) by mouth daily. 90 capsule 3   diltiazem  (CARDIZEM ) 30 MG tablet Take 1 tablet every 4 hours AS NEEDED for heart rate >100 as long as blood pressure >100. 120 tablet 11   diphenoxylate-atropine  (LOMOTIL) 2.5-0.025 MG tablet Take 1 tablet by mouth 4 (four) times daily as needed for diarrhea or loose stools.     dofetilide  (TIKOSYN ) 250 MCG capsule TAKE 1 CAPSULE BY MOUTH 2 TIMES DAILY. 180 capsule 2   estradiol  (ESTRACE ) 0.1 MG/GM vaginal cream Place 1 Applicatorful vaginally 3 (three) times a week.  42.5 g 12   ferrous fumarate  (HEMOCYTE - 106 MG FE) 325 (106 Fe) MG TABS tablet Take 1 tablet (106 mg of iron total) by mouth 2 (two) times daily. Take apart from pantoprazole  180 tablet 1   fluticasone  (FLONASE ) 50 MCG/ACT nasal spray Place 1 spray into both nostrils daily.     furosemide  (LASIX ) 40 MG tablet TAKE 1 TABLET BY MOUTH AS NEEDED 90 tablet 3   MAGNESIUM  GLYCINATE PO Take 300 mg by mouth daily as needed (upset stomach).     olopatadine  (PATANOL) 0.1 % ophthalmic solution Place 1 drop into both eyes daily as needed (allergies).     pantoprazole  (PROTONIX ) 40 MG tablet Take 1 tablet (40 mg total) by mouth 2 (two) times daily.     potassium chloride  SA (KLOR-CON  M) 20 MEQ tablet TAKE 1 TABLET(20 MEQ) BY MOUTH DAILY 90 tablet 2   pravastatin  (PRAVACHOL ) 40 MG tablet Take 1 tablet (40 mg total) by mouth every evening. 90 tablet 3   spironolactone  (ALDACTONE ) 25 MG tablet TAKE 1 TABLET(25 MG) BY MOUTH DAILY 90 tablet 3   sucralfate  (CARAFATE ) 1 g tablet Take 1 g by mouth 2 (two) times daily as needed (upset stomach).     tirzepatide  (ZEPBOUND ) 2.5 MG/0.5ML Pen Inject 2.5 mg into the skin once a week. 2 mL 0   valsartan  (DIOVAN ) 40 MG tablet Take 1 tablet (40 mg total) by mouth daily. (Patient taking differently: Take 10 mg by mouth at bedtime.) 90 tablet 3   Vitamin D , Ergocalciferol , (DRISDOL ) 1.25 MG (50000 UNIT) CAPS capsule Take 1 capsule (50,000 Units total) by mouth every 7 (seven) days. 12 capsule 0   XARELTO  20 MG TABS tablet TAKE 1 TABLET(20 MG) BY MOUTH DAILY WITH SUPPER 90 tablet 1   No current facility-administered medications for this encounter.    Allergies  Allergen Reactions   Ibuprofen Other (See Comments)    Pt is on Xarelto   Lactose Intolerance (Gi) Diarrhea and Nausea And Vomiting   Lisinopril Cough   Nsaids Other (See Comments)    Pt on Xarelto    Tape     Causes blisters    Tolmetin Other (See Comments)    Pt on Xarelto    Zoster Vaccine Live Other (See  Comments)    Serum sickness, high fever, neck pain    Amoxicillin Rash   Penicillins Rash    Local reaction    ROS- All systems are reviewed and negative except as per the HPI above  Physical Exam: Vitals:   08/29/23 1330  BP: 124/60  Pulse: 67  Weight: 113.2 kg  Height: 5' 8 (1.727 m)    Wt Readings from Last 3 Encounters:  08/29/23 113.2 kg  08/01/23 115.2 kg  07/25/23 115.8 kg    Labs: Lab Results  Component Value Date   NA 142 07/25/2023   K 4.4 07/25/2023   CL 108 07/25/2023   CO2 26 07/25/2023   GLUCOSE 91 07/25/2023   BUN 13 07/25/2023   CREATININE 0.72 07/25/2023   CALCIUM  9.2 07/25/2023   PHOS 3.1 12/02/2022   MG 2.0 07/25/2023   Lab Results  Component Value Date   INR 1.2 07/14/2021   Lab Results  Component Value Date   CHOL 154 07/25/2023   HDL 63.60 07/25/2023   LDLCALC 76 07/25/2023   TRIG 74.0 07/25/2023   GEN- The patient is well appearing, alert and oriented x 3 today.   Neck - no JVD or carotid bruit noted Lungs- Clear to ausculation bilaterally, normal work of breathing Heart- Regular rate and rhythm, no murmurs, rubs or gallops, PMI not laterally displaced Extremities- no clubbing, cyanosis, or edema Skin - no rash or ecchymosis noted   EKG-   Vent. rate 67 BPM PR interval 212 ms QRS duration 154 ms QT/QTcB 454/479 ms P-R-T axes * -25 67 Atrial-paced rhythm with prolonged AV conduction Right bundle branch block Left ventricular hypertrophy with repolarization abnormality ( R in aVL ) Cannot rule out Septal infarct , age undetermined Lateral infarct , age undetermined Abnormal ECG When compared with ECG of 01-Aug-2023 10:33, Previous ECG is present  02/11/21- Echo-1. Left ventricular ejection fraction, by estimation, is 65 to 70%. The  left ventricle has normal function. The left ventricle has no regional  wall motion abnormalities. There is moderate asymmetric left ventricular  hypertrophy of the basal-septal  segment.  Left ventricular diastolic parameters are indeterminate.   2. Right ventricular systolic function is normal. The right ventricular  size is mildly enlarged. There is normal pulmonary artery systolic  pressure. The estimated right ventricular systolic pressure is 30.2 mmHg.   3. The mitral valve is normal in structure. Trivial mitral valve  regurgitation. No evidence of mitral stenosis.   4. The aortic valve is tricuspid. Aortic valve regurgitation is not  visualized. No aortic stenosis is present.   5. The inferior vena cava is normal in size with greater than 50%  respiratory variability, suggesting right atrial pressure of 3 mmHg.   CHA2DS2-VASc Score = 6  The patient's score is based upon: CHF History: 1 HTN History: 1 Diabetes History: 0 Stroke History: 0 Vascular Disease History: 1 Age Score: 2 Gender Score: 1       ASSESSMENT AND PLAN: Paroxysmal Atrial Fibrillation (ICD10:  I48.0) The patient's CHA2DS2-VASc score is 6, indicating a 9.7% annual risk of stroke.   S/p Afib ablation on 08/01/23 by Dr. Kennyth.  Patient is currently in NSR.  Continue diltiazem  120 mg daily. Okay to begin Zepbound  from Afib standpoint.   Secondary Hypercoagulable State (ICD10:  D68.69) The patient is at significant risk for stroke/thromboembolism based upon her CHA2DS2-VASc Score of 6.  Continue Rivaroxaban  (Xarelto ).    High risk medication monitoring (ICD10: [id number]) Patient requires ongoing monitoring for anti-arrhythmic medication which has the potential to cause life threatening arrhythmias or AV block. Qtc stable. Continue Tikosyn  250 mcg BID. Cmet and mag level on 7/23 normal.    HTN Stable today.   Follow up with EP as scheduled.    Dorn Heinrich, PA-C Afib Clinic [facility] [address] [place], [place] [address] [phone]

## 2023-09-05 ENCOUNTER — Ambulatory Visit: Admitting: Internal Medicine

## 2023-09-09 ENCOUNTER — Other Ambulatory Visit: Payer: Self-pay | Admitting: Internal Medicine

## 2023-09-11 ENCOUNTER — Ambulatory Visit

## 2023-09-11 DIAGNOSIS — R001 Bradycardia, unspecified: Secondary | ICD-10-CM

## 2023-09-12 LAB — CUP PACEART REMOTE DEVICE CHECK
Battery Remaining Longevity: 84 mo
Battery Remaining Percentage: 73 %
Battery Voltage: 3.01 V
Brady Statistic AP VP Percent: 6.6 %
Brady Statistic AP VS Percent: 86 %
Brady Statistic AS VP Percent: 1.1 %
Brady Statistic AS VS Percent: 5.7 %
Brady Statistic RA Percent Paced: 91 %
Brady Statistic RV Percent Paced: 7.7 %
Date Time Interrogation Session: 20250909020016
Implantable Lead Connection Status: 753985
Implantable Lead Connection Status: 753985
Implantable Lead Implant Date: 20220912
Implantable Lead Implant Date: 20220912
Implantable Lead Location: 753859
Implantable Lead Location: 753860
Implantable Lead Model: 3830
Implantable Pulse Generator Implant Date: 20220912
Lead Channel Impedance Value: 380 Ohm
Lead Channel Impedance Value: 610 Ohm
Lead Channel Pacing Threshold Amplitude: 0.75 V
Lead Channel Pacing Threshold Amplitude: 1 V
Lead Channel Pacing Threshold Pulse Width: 0.4 ms
Lead Channel Pacing Threshold Pulse Width: 0.4 ms
Lead Channel Sensing Intrinsic Amplitude: 0.7 mV
Lead Channel Sensing Intrinsic Amplitude: 12 mV
Lead Channel Setting Pacing Amplitude: 2 V
Lead Channel Setting Pacing Amplitude: 2.5 V
Lead Channel Setting Pacing Pulse Width: 0.4 ms
Lead Channel Setting Sensing Sensitivity: 2 mV
Pulse Gen Model: 2272
Pulse Gen Serial Number: 3956585

## 2023-09-18 DIAGNOSIS — R1013 Epigastric pain: Secondary | ICD-10-CM | POA: Diagnosis not present

## 2023-09-18 DIAGNOSIS — R070 Pain in throat: Secondary | ICD-10-CM | POA: Diagnosis not present

## 2023-09-18 DIAGNOSIS — Z7901 Long term (current) use of anticoagulants: Secondary | ICD-10-CM | POA: Diagnosis not present

## 2023-09-19 ENCOUNTER — Encounter: Payer: Self-pay | Admitting: Internal Medicine

## 2023-09-19 ENCOUNTER — Telehealth: Payer: Self-pay

## 2023-09-19 NOTE — Telephone Encounter (Signed)
   Pre-operative Risk Assessment    Patient Name: Veronica Bradford  DOB: [birth year] MRN: [id number]   Date of last office visit: 04/02/23 FONDA KITTY, MD Date of next office visit: 11/06/23 FONDA KITTY, MD   Request for Surgical Clearance    Procedure:  ENDOSCOPY  Date of Surgery:  Clearance 10/16/23                                Surgeon:  DR ESTELITA MANAS Surgeon's Group or Practice Name:  [facility] Phone number:  [phone] Fax number:  [phone]   Type of Clearance Requested:   - Medical  - Pharmacy:  Hold Rivaroxaban  (Xarelto ) 2 DAYS BEFORE   Type of Anesthesia:  PROPOFOL    Additional requests/questions:    Signed, Lucie DELENA Ku   09/19/2023, 7:52 AM

## 2023-09-21 ENCOUNTER — Telehealth: Payer: Self-pay

## 2023-09-21 MED ORDER — ZEPBOUND 5 MG/0.5ML ~~LOC~~ SOAJ: 5.0000 mg | SUBCUTANEOUS | 0 refills | Status: DC

## 2023-09-21 NOTE — Telephone Encounter (Signed)
  Patient Consent for Virtual Visit        Veronica Bradford has provided verbal consent on 09/21/2023 for a virtual visit (video or telephone).   CONSENT FOR VIRTUAL VISIT FOR:  Veronica Bradford  By participating in this virtual visit I agree to the following:  I hereby voluntarily request, consent and authorize [facility] and its employed or contracted physicians, physician assistants, nurse practitioners or other licensed health care professionals (the Practitioner), to provide me with telemedicine health care services (the "Services) as deemed necessary by the treating Practitioner. I acknowledge and consent to receive the Services by the Practitioner via telemedicine. I understand that the telemedicine visit will involve communicating with the Practitioner through live audiovisual communication technology and the disclosure of certain medical information by electronic transmission. I acknowledge that I have been given the opportunity to request an in-person assessment or other available alternative prior to the telemedicine visit and am voluntarily participating in the telemedicine visit.  I understand that I have the right to withhold or withdraw my consent to the use of telemedicine in the course of my care at any time, without affecting my right to future care or treatment, and that the Practitioner or I may terminate the telemedicine visit at any time. I understand that I have the right to inspect all information obtained and/or recorded in the course of the telemedicine visit and may receive copies of available information for a reasonable fee.  I understand that some of the potential risks of receiving the Services via telemedicine include:  Delay or interruption in medical evaluation due to technological equipment failure or disruption; Information transmitted may not be sufficient (e.g. poor resolution of images) to allow for appropriate medical decision making by the Practitioner;  and/or  In rare instances, security protocols could fail, causing a breach of personal health information.  Furthermore, I acknowledge that it is my responsibility to provide information about my medical history, conditions and care that is complete and accurate to the best of my ability. I acknowledge that Practitioner's advice, recommendations, and/or decision may be based on factors not within their control, such as incomplete or inaccurate data provided by me or distortions of diagnostic images or specimens that may result from electronic transmissions. I understand that the practice of medicine is not an exact science and that Practitioner makes no warranties or guarantees regarding treatment outcomes. I acknowledge that a copy of this consent can be made available to me via my patient portal [facility] MyChart), or I can request a printed copy by calling the office of [facility].    I understand that my insurance will be billed for this visit.   I have read or had this consent read to me. I understand the contents of this consent, which adequately explains the benefits and risks of the Services being provided via telemedicine.  I have been provided ample opportunity to ask questions regarding this consent and the Services and have had my questions answered to my satisfaction. I give my informed consent for the services to be provided through the use of telemedicine in my medical care

## 2023-09-21 NOTE — Telephone Encounter (Signed)
   Name: Veronica Bradford  DOB: June 10, 1945  MRN: [id number]  Primary Cardiologist: None  Chart reviewed as part of pre-operative protocol coverage. Because of [name]'s past medical history and time since last visit, she will require a follow-up telephone visit in order to better assess preoperative cardiovascular risk.  Pre-op covering staff: - Please schedule appointment and call patient to inform them. If patient already had an upcoming appointment within acceptable timeframe, please add pre-op clearance to the appointment notes so provider is aware. - Please contact requesting surgeon's office via preferred method (i.e, phone, fax) to inform them of need for appointment prior to surgery.  Patient has had an Afib/aflutter ablation  on 08/01/2023 uninterrupted DOAC is recommended for 3 months post ablation. Need to move procedure date anytime after 10/30/2023      [name], PA-C  09/21/2023, 9:45 AM

## 2023-09-21 NOTE — Progress Notes (Signed)
 Remote PPM Transmission

## 2023-09-21 NOTE — Telephone Encounter (Signed)
 Rx sent.

## 2023-09-21 NOTE — Addendum Note (Signed)
 Addended by: Mearl Harewood D on: 09/21/2023 12:45 PM   Modules accepted: Orders

## 2023-09-21 NOTE — Telephone Encounter (Signed)
 Preop tele appt now scheduled med rec and consent done

## 2023-09-21 NOTE — Telephone Encounter (Signed)
 Patient with diagnosis of Afib on Xarelto   for anticoagulation.    Procedure: ENDOSCOPY  Date of procedure: 10/16/2023   CHA2DS2-VASc Score = 6   This indicates a 9.7% annual risk of stroke. The patient's score is based upon: CHF History: 1 HTN History: 1 Diabetes History: 0 Stroke History: 0 Vascular Disease History: 1 Age Score: 2 Gender Score: 1   CrCl 89 mL/min ( SrCr 0.72 on 07/25/23) Platelet count 244 K  Patient has had an Afib/aflutter ablation  on 08/01/2023 uninterrupted DOAC is recommended for 3 months post ablation. Need to move procedure date anytime after 10/30/2023      **This guidance is not considered finalized until pre-operative APP has relayed final recommendations.**

## 2023-09-21 NOTE — Telephone Encounter (Signed)
 Send a month supply of Zepbound  5 mg weekly

## 2023-09-23 ENCOUNTER — Ambulatory Visit: Payer: Self-pay | Admitting: Cardiology

## 2023-09-24 ENCOUNTER — Encounter (HOSPITAL_BASED_OUTPATIENT_CLINIC_OR_DEPARTMENT_OTHER): Payer: Self-pay | Admitting: Pulmonary Disease

## 2023-09-24 DIAGNOSIS — G4733 Obstructive sleep apnea (adult) (pediatric): Secondary | ICD-10-CM

## 2023-09-27 ENCOUNTER — Ambulatory Visit

## 2023-09-27 ENCOUNTER — Other Ambulatory Visit (HOSPITAL_COMMUNITY): Payer: Self-pay

## 2023-09-27 NOTE — Telephone Encounter (Signed)
 Per preop APP Lum Louis, NP pt's tele preop appt needs to be moved out : This patient tele visit needs to be moved from today to late October as noted in the request. Patient has had an Afib/aflutter ablation on 08/01/2023 uninterrupted DOAC is recommended for 3 months post ablation. Need to move procedure date anytime after 10/30/2023 . Her current procedure date is set up for 10/14. Surgeons office needs to be contacted and procedure date moved as well as tele visit today.    I s/w the pt and explained that we needed to move out her tele preop appt for reason's stated above.   I assured the pt that I will let Dr. Saintclair know as well of the notes due to recent ablation.

## 2023-09-27 NOTE — Telephone Encounter (Signed)
 Televisit has been rescheduled till after 10/30/2023.  Due to recent A-fib/flutter ablation on 08/01/2023.  Pharmacy can you please comment on Xarelto  hold?

## 2023-10-02 DIAGNOSIS — I8311 Varicose veins of right lower extremity with inflammation: Secondary | ICD-10-CM | POA: Diagnosis not present

## 2023-10-02 DIAGNOSIS — R600 Localized edema: Secondary | ICD-10-CM | POA: Diagnosis not present

## 2023-10-02 DIAGNOSIS — I87392 Chronic venous hypertension (idiopathic) with other complications of left lower extremity: Secondary | ICD-10-CM | POA: Diagnosis not present

## 2023-10-02 DIAGNOSIS — I8312 Varicose veins of left lower extremity with inflammation: Secondary | ICD-10-CM | POA: Diagnosis not present

## 2023-10-02 DIAGNOSIS — I890 Lymphedema, not elsewhere classified: Secondary | ICD-10-CM | POA: Diagnosis not present

## 2023-10-04 ENCOUNTER — Encounter: Payer: Self-pay | Admitting: Cardiology

## 2023-10-05 MED ORDER — DOFETILIDE 250 MCG PO CAPS: 250.0000 ug | ORAL_CAPSULE | Freq: Two times a day (BID) | ORAL | 2 refills | Status: DC

## 2023-10-08 ENCOUNTER — Ambulatory Visit: Admitting: Internal Medicine

## 2023-10-18 DIAGNOSIS — L820 Inflamed seborrheic keratosis: Secondary | ICD-10-CM | POA: Diagnosis not present

## 2023-10-18 DIAGNOSIS — L821 Other seborrheic keratosis: Secondary | ICD-10-CM | POA: Diagnosis not present

## 2023-10-18 DIAGNOSIS — Z85828 Personal history of other malignant neoplasm of skin: Secondary | ICD-10-CM | POA: Diagnosis not present

## 2023-10-22 MED ORDER — ZEPBOUND 7.5 MG/0.5ML ~~LOC~~ SOAJ: 7.5000 mg | SUBCUTANEOUS | 0 refills | Status: DC

## 2023-10-22 NOTE — Addendum Note (Signed)
 Addended by: Gwenevere Goga D on: 10/22/2023 09:03 AM   Modules accepted: Orders

## 2023-10-24 ENCOUNTER — Other Ambulatory Visit (HOSPITAL_COMMUNITY): Payer: Self-pay | Admitting: *Deleted

## 2023-10-24 MED ORDER — POTASSIUM CHLORIDE CRYS ER 20 MEQ PO TBCR: 20.0000 meq | EXTENDED_RELEASE_TABLET | Freq: Every day | ORAL | 2 refills | Status: AC

## 2023-10-31 ENCOUNTER — Encounter: Payer: Self-pay | Admitting: Internal Medicine

## 2023-10-31 ENCOUNTER — Ambulatory Visit: Admitting: Internal Medicine

## 2023-10-31 ENCOUNTER — Ambulatory Visit

## 2023-10-31 VITALS — BP 118/66 | HR 66 | Temp 97.9°F | Resp 18 | Ht 67.5 in | Wt 232.5 lb

## 2023-10-31 DIAGNOSIS — Z7901 Long term (current) use of anticoagulants: Secondary | ICD-10-CM | POA: Diagnosis not present

## 2023-10-31 DIAGNOSIS — I10 Essential (primary) hypertension: Secondary | ICD-10-CM

## 2023-10-31 DIAGNOSIS — K219 Gastro-esophageal reflux disease without esophagitis: Secondary | ICD-10-CM | POA: Diagnosis not present

## 2023-10-31 DIAGNOSIS — E559 Vitamin D deficiency, unspecified: Secondary | ICD-10-CM | POA: Diagnosis not present

## 2023-10-31 LAB — BASIC METABOLIC PANEL WITH GFR
BUN: 15 mg/dL (ref 6–23)
CO2: 26 meq/L (ref 19–32)
Calcium: 9 mg/dL (ref 8.4–10.5)
Chloride: 105 meq/L (ref 96–112)
Creatinine, Ser: 0.7 mg/dL (ref 0.40–1.20)
GFR: 84.34 mL/min (ref >60.00)
Glucose, Bld: 87 mg/dL (ref 70–99)
Potassium: 4.7 meq/L (ref 3.5–5.1)
Sodium: 141 meq/L (ref 135–145)

## 2023-10-31 LAB — VITAMIN D 25 HYDROXY (VIT D DEFICIENCY, FRACTURES): VITD: 44.38 ng/mL (ref 30.00–100.00)

## 2023-10-31 MED ORDER — FERROUS FUMARATE 325 (106 FE) MG PO TABS: 1.0000 | ORAL_TABLET | Freq: Every day | ORAL | Status: DC

## 2023-10-31 MED ORDER — ZEPBOUND 7.5 MG/0.5ML ~~LOC~~ SOAJ: 7.5000 mg | SUBCUTANEOUS | 3 refills | Status: DC

## 2023-10-31 NOTE — Patient Instructions (Addendum)
 GO TO THE LAB :  Get the blood work    Then, go to the front desk for the checkout Please make an appointment for a checkup in 3 to 4 months   Please reach out to cardiology and see if you need to stop valsartan    Check the  blood pressure regularly Blood pressure goal:  between 110/65 and  135/85. If it is consistently higher or lower, let me know    YOUR PLAN (AI generated)   ATRIAL FIBRILLATION: Your atrial fibrillation is being managed with Tikosyn  and Xarelto . You had a cardiac ablation in July, and your atrial bigeminy has resolved with diltiazem . -Continue taking Tikosyn  and Xarelto  as prescribed. -Continue taking long-acting diltiazem  120 mg once daily. -Follow up with your cardiologist next week.  HYPERTENSION: - Your blood pressure has been decreasing and you are taking less valsartan . - Please ask your cardiologist if you should stop valsartan  completely.    GASTROESOPHAGEAL REFLUX DISEASE (GERD): You have increased GERD symptoms, possibly due to your GLP-1 medication. You are managing it with dietary changes and increased Pepcid. -Continue your dietary modifications. -Continue taking pantoprazole , Pepcid twice daily and sucralfate  as needed. -Follow up with Dr. Saintclair, your gastroenterologist, for endoscopy scheduling after cardioversion clearance.  IRON DEFICIENCY ANEMIA: Your iron deficiency anemia is being managed with Hemocyte, and your ferritin levels are adequate. -Continue taking Hemocyte once daily.  MORBID OBESITY: You are managing your weight with GLP-1 medication and dietary modifications. -Continue taking your GLP-1 medication at a 7.5 mg dose. -Continue following the [employer] program.  HYPERLIPIDEMIA: Your hyperlipidemia is well-controlled with pravastatin  and colestipol. Your LDL level is at 76. -Continue taking pravastatin  as prescribed. -Continue taking colestipol, one tablet twice daily.

## 2023-10-31 NOTE — Progress Notes (Unsigned)
 Subjective:    Patient ID: Veronica Bradford, female    DOB: 1945/06/16, 78 y.o.   MRN: [id number]  DOS:  10/31/2023 Follow-up  Discussed the use of AI scribe software for clinical note transcription with the patient, who gave verbal consent to proceed.  History of Present Illness Veronica Bradford is a 78 year old female with atrial fibrillation and hypertension who presents for a follow-up visit.  Cardiac arrhythmia and blood pressure management - History of atrial fibrillation, status post cardiac ablation in July 2025 - Maintains close follow-up with cardiology - Current antihypertensive regimen includes valsartan , reduced to approximately 6.6 mg daily due to episodes of low blood pressure - Seated blood pressure ranges from 125 mmHg to the teens after meals - Current medications include Tikosyn , Xarelto , pravastatin , colestipol (reduced to one tablet twice daily), and diltiazem  120 mg daily  Gastroesophageal reflux disease and upper gastrointestinal symptoms - Increased GERD symptoms since initiation of GLP-1 agonist therapy - Manages symptoms with smaller meals and increased Pepcid intake - Uses sucralfate , with challenges in medication timing for optimal absorption - Constant heartburn present - No odynophagia - No hematochezia or hematuria - Awaiting clearance for upper endoscopy due to history of erosive gastric changes - Larger meals exacerbate GERD symptoms  Obstructive sleep apnea - Uses CPAP machine nightly  Dietary modifications - Adheres to Weight Watchers program - Focuses on low-fat, high-protein, and vegetable-rich meals - Avoids concentrated sweets   Wt Readings from Last 3 Encounters:  10/31/23 232 lb 8 oz (105.5 kg)  08/29/23 249 lb 9.6 oz (113.2 kg)  08/29/23 249 lb (112.9 kg)     Review of Systems See above   Past Medical History:  Diagnosis Date   Atrial fibrillation (HCC)    Atrial fibrillation (HCC)    Back pain    CHF (congestive heart  failure) (HCC)    grade 2 HF   Chronic sinusitis    s/p 2 surgeries remotely, Dr Ethyl   Closed fracture of metatarsal of left foot    L foot, fifth metatarsal   Complication of anesthesia    woke up once during procedure   Dysrhythmia    a-fib   Ectopic pregnancy    Endometrial cancer (HCC) 2004   Early diagnosis   GERD (gastroesophageal reflux disease)    Hyperlipemia    Hypertension    IBS (irritable bowel syndrome) 08/13/2014   Dx 2015, diarrhea on-off, Dr Donnald    Lumbar radiculopathy 08/2012   MRI in 08/2012   Mild hyperparathyroidism    Osteopenia    PAF (paroxysmal atrial fibrillation) (HCC)    Presence of permanent cardiac pacemaker    Sciatica of right side 08/2013   Received Depomedrol 80 mg injection   Sleep apnea 09/09/2013   Dx with OSA in 2015, by Dr. Fernande,   wears CPAPAPAP    Past Surgical History:  Procedure Laterality Date   ATRIAL FIBRILLATION ABLATION N/A 09/04/2017   Procedure: ATRIAL FIBRILLATION ABLATION;  Surgeon: Kelsie Agent, MD;  Location: [facility];  Service: Cardiovascular;  Laterality: N/A;   ATRIAL FIBRILLATION ABLATION N/A 08/01/2023   Procedure: ATRIAL FIBRILLATION ABLATION;  Surgeon: Kennyth Chew, MD;  Location: [facility] INVASIVE CV LAB;  Service: Cardiovascular;  Laterality: N/A;   BIOPSY  03/11/2020   Procedure: BIOPSY;  Surgeon: Donnald Charleston, MD;  Location: [facility]OS[facility];  Service: Endoscopy;;  EGD and COLON   CARPAL TUNNEL RELEASE Right 06/17/2019   CARPAL TUNNEL RELEASE Left 11/2020  CHOLECYSTECTOMY  ~2006   COLONOSCOPY WITH PROPOFOL  N/A 03/11/2020   Procedure: COLONOSCOPY WITH PROPOFOL ;  Surgeon: Donnald Charleston, MD;  Location: [facility];  Service: Endoscopy;  Laterality: N/A;   DILATION AND CURETTAGE OF UTERUS     after SAB   ESOPHAGOGASTRODUODENOSCOPY  02/09/2014   Dr. Donnald   ESOPHAGOGASTRODUODENOSCOPY (EGD) WITH PROPOFOL  N/A 03/11/2020   Procedure: ESOPHAGOGASTRODUODENOSCOPY (EGD) WITH PROPOFOL ;  Surgeon:  Donnald Charleston, MD;  Location: [facility];  Service: Endoscopy;  Laterality: N/A;   FINGER SURGERY Left    index   NASAL SINUS SURGERY     x 2 remotely, Dr Ethyl   PACEMAKER IMPLANT N/A 09/13/2020   Procedure: PACEMAKER IMPLANT;  Surgeon: Fernande Elspeth BROCKS, MD;  Location: [facility] INVASIVE CV LAB;  Service: Cardiovascular;  Laterality: N/A;   PARATHYROIDECTOMY Left 09/14/2022   Procedure: LEFT PARATHYROIDECTOMY;  Surgeon: Eletha Boas, MD;  Location: [facility];  Service: General;  Laterality: Left;   TONSILLECTOMY     TOTAL ABDOMINAL HYSTERECTOMY W/ BILATERAL SALPINGOOPHORECTOMY     TOTAL HIP ARTHROPLASTY Left 07/18/2021   Procedure: TOTAL HIP ARTHROPLASTY ANTERIOR APPROACH;  Surgeon: Fidel Rogue, MD;  Location: [facility];  Service: Orthopedics;  Laterality: Left;   TOTAL KNEE ARTHROPLASTY Right ~2008    Current Outpatient Medications  Medication Instructions   acetaminophen  (TYLENOL ) 500 mg, Every 6 hours PRN   acetaminophen -codeine  (TYLENOL  #3) 300-30 MG tablet 1 tablet, Oral, At bedtime PRN   ARTIFICIAL TEAR SOLUTION OP 1 drop, 4 times daily PRN   ascorbic acid (VITAMIN C) 500 mg, 2 times daily   B Complex-C (SUPER B COMPLEX/VITAMIN C PO) 1 tablet, Daily   colestipol (COLESTID) 2 g, 2 times daily   CoQ10 200 mg, Daily   CRANBERRY EXTRACT PO 1 tablet, 2 times daily   diltiazem  (CARDIZEM  CD) 120 mg, Oral, Daily   diltiazem  (CARDIZEM ) 30 MG tablet Take 1 tablet every 4 hours AS NEEDED for heart rate >100 as long as blood pressure >100.   diphenoxylate-atropine  (LOMOTIL) 2.5-0.025 MG tablet 1 tablet, 4 times daily PRN   dofetilide  (TIKOSYN ) 250 mcg, Oral, 2 times daily   estradiol  (ESTRACE ) 0.1 MG/GM vaginal cream 1 Applicatorful, Vaginal, 3 times weekly   ferrous fumarate  (HEMOCYTE - 106 MG FE) 325 (106 Fe) MG TABS tablet 106 mg of iron, Oral, 2 times daily, Take apart from pantoprazole    fluticasone  (FLONASE ) 50 MCG/ACT nasal spray 1 spray, Daily   furosemide  (LASIX ) 40 mg, Oral, As needed    MAGNESIUM  GLYCINATE PO 300 mg, Daily PRN   olopatadine  (PATANOL) 0.1 % ophthalmic solution 1 drop, Daily PRN   pantoprazole  (PROTONIX ) 40 mg, Oral, 2 times daily   potassium chloride  SA (KLOR-CON  M) 20 MEQ tablet 20 mEq, Oral, Daily   pravastatin  (PRAVACHOL ) 40 mg, Oral, Every evening   spironolactone  (ALDACTONE ) 25 MG tablet TAKE 1 TABLET(25 MG) BY MOUTH DAILY   sucralfate  (CARAFATE ) 1 g, 2 times daily PRN   valsartan  (DIOVAN ) 40 mg, Oral, Daily   Vitamin D3 2,000 Units, Daily   XARELTO  20 MG TABS tablet TAKE 1 TABLET(20 MG) BY MOUTH DAILY WITH SUPPER   Zepbound  7.5 mg, Subcutaneous, Weekly       Objective:   Physical Exam BP 118/66   Pulse 66   Temp 97.9 F (36.6 C) (Oral)   Resp 18   Ht 5' 7.5 (1.715 m)   Wt 232 lb 8 oz (105.5 kg)   SpO2 98%   BMI 35.88 kg/m  General:   Well  developed, NAD, BMI noted. HEENT:  Normocephalic . Face symmetric, atraumatic Lungs:  CTA B Normal respiratory effort, no intercostal retractions, no accessory muscle use. Heart: RRR,  no murmur.  Lower extremities: no pretibial edema bilaterally  Skin: Not pale. Not jaundice Neurologic:  alert & oriented X3.  Speech normal, gait appropriate for age and unassisted Psych--  Cognition and judgment appear intact.  Cooperative with normal attention span and concentration.  Behavior appropriate. No anxious or depressed appearing.      Assessment   Assessment: Prediabetes HTN Hyperlipidemia P. Atrial fibrillation, A. flutter: on xarelto  flecainide -diltiazem   prn --Ablation 09/2017 --Pacemaker 09-2020 --Ablation 08/01/2023 GI: Dr Donnald  --GERD (zantac or protonix  prn), IBS, chronic diarrhea --GI records: Colonoscopies 1998, 2004, 2009 8 and 07-2013. Negative for polyps or IBD. +  EGD/cscope 11/2016; EGD/Cscope 03/11/2020   --Increased LFTs: CT abdomen 05/2016: Normal liver.05/2017: WNL  Hepatitis B and C, Alpha 1 antitrypsin, anti-smooth muscle antibody, ANA trans-ferritin  OSA--- CPAP   Osteopenia:  Multiple  Dexas, last few:  T score 2009  -1.1; T score 2015  -0.7; T score 05/2017 -1.8 DJD --Chronic back pain-- tylenol  #3 prn --- RF per PCP -- Scoliosis,DJD, history of epidurals --CIPRO  pre dental d/t knee replacement (rx by ortho) H/o endometrial cancer, found during a hysterectomy, no chemotherapy or XRT.  Primary hyperparathyroidism: Chronic hypercalcemia, L parathyroidectomy 09/14/2022 Skin cancer  Chronic lower extremity edema L>R (eval by previous pcp, related to varicose veins?)    Assessment & Plan Atrial fibrillation Had an ablation on 08/01/2023, closely follow-up by cardiology. Had atrial bigeminy post-ablation resolved after diltiazem  restarted.  She also takes Tikosyn  and Xarelto . Plan: Follow-up with cardiology HTN Ambulatory BPs have been gradually decreasing, she thinks is related to weight loss from GLP-1's.  She has reduced her valsartan  dose to 1/4 of a tablet.  She also takes diltiazem , Lasix , potassium, Aldactone .  We talked about stopping valsartan  completely but when she tried BP went up to the 130s.  Plan: Check BMP, recommend to discuss valsartan  dosing with cardiology. GERD: Increased symptoms potentially due to GLP-1 medication.  After she consulted with her GI doctor, they agreed to continue pantoprazole , sucralfate , Pepcid. She also was recommended to have EGD but due to cardiac issues that procedure has been postponed.  Recommend to continue working with GI regarding this issue. Iron deficiency: Managed with Hemocyte.  Unable to take Hemocyte twice daily, will continue to 1 tablet daily. Vitamin D  deficiency: Had ergocalciferol , now on OTCs, check levels. Morbid obesity Managed with GLP-1 medication and dietary modifications.  Having some issues with GERD but she will if she can continue doing Zepbound  7.5 mg weekly.  Refill sent. High cholesterol Managed with pravastatin  and colestipol. LDL level at 76, indicating good control.  Continue  pravastatin  and colestipol Preventive care: Had a flu shot, per guidelines COVID-vaccine is recommended every 6 months on her age group, next shot in 2 months. RTC 3 to 4 months   7/// PLAN: Here for CPX - Td 2023 - pnm shot 2015;  prevnar 2015; PNM 20: 23 -  zostavax 2013: had s/e  severe HA (and fever?) after zostavax ;  reluctant to take shingrex  -  COVID vax 07/04/23 - RSV 2023 - ref a flu shot  this fall  --Female care:  h/o endometrial ca; sees  gyn    -Last mammogram 03/2023 ([facility]) --CCS:  multiple cscopes, last colonoscopy Dr. Donnald 03/11/2020, no polyps, random BX: Negative.  Next per GI  --  Diet and exercise discussed --Labs:   CMP FLP CBC anemia panel magnesium  vitamin D  -ACP: documents charted  Other issues: Prediabetes: Blood sugars have been WNL.   SABRA HTN: BP looks good today, currently on Diovan  10 mg daily, Aldactone . Is not taking Cardizem . BP seems controlled, no change. Hyperlipidemia: Diet controlled, checking labs Atrial fibrillation: To have an ablation soon, labs today. Iron deficiency: No GI symptoms other than episodic diarrhea, on iron supplements, check CBC, request anemia panel. OSA: Good CPAP compliance. Morbid obesity: We discussed GLP-1's before, at this point both pulmonary and cardiology have encouraged her to initiate medicines. Potential issues include cost, coverage, GI side effects. We agreed to proceed after the ablation. She will reach out for a prescription, we will start with a low dose of Zepbound  for OSA, and titrate gradually. Continue with a healthy diet and follow-up a month after the first injection. HOH: Request a referral to audiology. RTC 4 months.  Sooner if she is started Zepbound 

## 2023-11-01 ENCOUNTER — Ambulatory Visit: Payer: Self-pay | Admitting: Internal Medicine

## 2023-11-01 ENCOUNTER — Encounter: Admitting: Physician Assistant

## 2023-11-01 NOTE — Assessment & Plan Note (Signed)
 Atrial fibrillation Had an ablation on 08/01/2023, closely follow-up by cardiology. Had atrial bigeminy post-ablation resolved after diltiazem  restarted.  She also takes Tikosyn  and Xarelto . Plan: Follow-up with cardiology HTN Ambulatory BPs have been gradually decreasing, she thinks is related to weight loss from GLP-1's.  She has reduced her valsartan  dose to 1/4 of a tablet.  She also takes diltiazem , Lasix , potassium, Aldactone .  We talked about stopping valsartan  completely but when she tried BP went up to the 130s so declined to stop valsartan .  Plan: Check BMP, recommend to discuss valsartan  dosing with cardiology. GERD: Increased symptoms potentially due to GLP-1 medication.  After she consulted with her GI doctor, they agreed to continue pantoprazole , sucralfate , Pepcid. She also was recommended to have EGD but due to cardiac issues that procedure has been postponed.  Recommend to continue working with GI regarding this issue. Iron deficiency: Managed with Hemocyte but unable to take twice daily, will continue to 1 tablet daily. Vitamin D  deficiency: Had ergocalciferol , now on OTCs, check levels. Morbid obesity Managed with GLP-1 medication and dietary modifications.  Having some issues with GERD but she will if she can continue doing Zepbound  7.5 mg weekly.  Refill sent. High cholesterol Managed with pravastatin  and colestipol. LDL level at 76, indicating good control.   Preventive care: Had a flu shot, per guidelines COVID-vaccine is recommended every 6 months on her age group, next shot in 2 months. RTC 3 to 4 months

## 2023-11-06 ENCOUNTER — Encounter: Payer: Self-pay | Admitting: Cardiology

## 2023-11-06 ENCOUNTER — Ambulatory Visit: Attending: Cardiology | Admitting: Cardiology

## 2023-11-06 VITALS — BP 114/69 | HR 64 | Ht 67.5 in | Wt 231.0 lb

## 2023-11-06 DIAGNOSIS — I483 Typical atrial flutter: Secondary | ICD-10-CM

## 2023-11-06 DIAGNOSIS — I480 Paroxysmal atrial fibrillation: Secondary | ICD-10-CM | POA: Diagnosis not present

## 2023-11-06 DIAGNOSIS — I10 Essential (primary) hypertension: Secondary | ICD-10-CM

## 2023-11-06 DIAGNOSIS — D6869 Other thrombophilia: Secondary | ICD-10-CM

## 2023-11-06 DIAGNOSIS — I495 Sick sinus syndrome: Secondary | ICD-10-CM

## 2023-11-06 DIAGNOSIS — Z950 Presence of cardiac pacemaker: Secondary | ICD-10-CM

## 2023-11-06 LAB — CUP PACEART INCLINIC DEVICE CHECK
Battery Remaining Longevity: 84 mo
Battery Voltage: 3.01 V
Brady Statistic RA Percent Paced: 93 %
Brady Statistic RV Percent Paced: 8.3 %
Date Time Interrogation Session: 20251104165443
Implantable Lead Connection Status: 753985
Implantable Lead Connection Status: 753985
Implantable Lead Implant Date: 20220912
Implantable Lead Implant Date: 20220912
Implantable Lead Location: 753859
Implantable Lead Location: 753860
Implantable Lead Model: 3830
Implantable Pulse Generator Implant Date: 20220912
Lead Channel Impedance Value: 375 Ohm
Lead Channel Impedance Value: 587.5 Ohm
Lead Channel Pacing Threshold Amplitude: 0.75 V
Lead Channel Pacing Threshold Amplitude: 0.75 V
Lead Channel Pacing Threshold Amplitude: 1.25 V
Lead Channel Pacing Threshold Amplitude: 1.25 V
Lead Channel Pacing Threshold Pulse Width: 0.4 ms
Lead Channel Pacing Threshold Pulse Width: 0.4 ms
Lead Channel Pacing Threshold Pulse Width: 0.4 ms
Lead Channel Pacing Threshold Pulse Width: 0.4 ms
Lead Channel Sensing Intrinsic Amplitude: 1.4 mV
Lead Channel Sensing Intrinsic Amplitude: 12 mV
Lead Channel Setting Pacing Amplitude: 2 V
Lead Channel Setting Pacing Amplitude: 2.5 V
Lead Channel Setting Pacing Pulse Width: 0.4 ms
Lead Channel Setting Sensing Sensitivity: 2 mV
Pulse Gen Model: 2272
Pulse Gen Serial Number: 3956585

## 2023-11-06 NOTE — Progress Notes (Signed)
 Electrophysiology Office Note:   Date:  11/06/2023  ID:  Veronica Bradford, DOB August 27, 1945, MRN [id number]  Primary Cardiologist: None Electrophysiologist: Fonda Kitty, MD      History of Present Illness:   Veronica Bradford is a 78 y.o. 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 Fernande.  S/p ablation on 08/01/2023.  She was found to have reconnection of the LSPV, LIPV, RSPV.  Discussed the use of AI scribe software for clinical note transcription with the patient, who gave verbal consent to proceed.  History of Present Illness Veronica Bradford is a 78 year old female with atrial fibrillation who presents for evaluation of recurrent episodes despite medication. She was referred by Dr. Fernande for evaluation of her atrial fibrillation.  The patient has a history of atrial fibrillation and is currently on dofetilide . Despite this medication, she continues to experience episodes of atrial fibrillation approximately every three to four weeks, sometimes more frequently. Symptoms include pulsatile tinnitus and abrupt changes in heart rate, often waking her at night. She uses a Kardia mobile device and a watch to monitor her pulse, noting irregularities that coincide with her symptoms. Episodes predominantly occur at night, between 3 and 7 AM, but can occasionally happen during the day. These episodes significantly impact her daily life, lasting up to nine hours and leaving her fatigued and unable to perform daily activities. Prior to starting dofetilide , episodes lasted two to three days.  In 2019, she underwent an ablation procedure, resulting in a difficult recovery. She experienced rapid atrial fibrillation post-surgery, chest burning for six weeks, and new arrhythmias. During a follow-up visit, she was informed of  atrial flutter. She describes the recovery as 'rough,' with significant discomfort and prolonged symptoms.    Review of systems complete and found to be negative unless listed in HPI.   EP Information / Studies Reviewed:    EKG is not ordered today. EKG from 03/27/23 reviewed which showed atrial paced rhythm.      Echo 02/11/2021: 1. Left ventricular ejection fraction, by estimation, is 65 to 70%. The  left ventricle has normal function. The left ventricle has no regional  wall motion abnormalities. There is moderate asymmetric left ventricular  hypertrophy of the basal-septal  segment. Left ventricular diastolic parameters are indeterminate.   2. Right ventricular systolic function is normal. The right ventricular  size is mildly enlarged. There is normal pulmonary artery systolic  pressure. The estimated right ventricular systolic pressure is 30.2 mmHg.   3. The mitral valve is normal in structure. Trivial mitral valve  regurgitation. No evidence of mitral stenosis.   4. The aortic valve is tricuspid. Aortic valve regurgitation is not  visualized. No aortic stenosis is present.   5. The inferior vena cava is normal in size with greater than 50%  respiratory variability, suggesting right atrial pressure of 3 mmHg.   Risk Assessment/Calculations:    CHA2DS2-VASc Score = 6   This indicates a 9.7% annual risk of stroke. The patient's score is based upon: CHF History: 1 HTN History: 1 Diabetes History: 0 Stroke History: 0 Vascular Disease History: 1 Age Score: 2 Gender Score: 1   {This patient has a significant risk of stroke if diagnosed with atrial fibrillation.  Please consider VKA or DOAC agent for anticoagulation if the bleeding risk is acceptable.   You  can also use the SmartPhrase .[email or web address] for documentation.   :[id number]} No BP recorded.  {Refresh Note OR Click here to enter BP  :1}***        Physical Exam:   VS:  There were no vitals taken for this visit.   Wt  Readings from Last 3 Encounters:  10/31/23 232 lb 8 oz (105.5 kg)  08/29/23 249 lb 9.6 oz (113.2 kg)  08/29/23 249 lb (112.9 kg)     GEN: Well nourished, well developed in no acute distress NECK: No JVD; No carotid bruits CARDIAC: Normal rate, regular RESPIRATORY:  Clear to auscultation without rales, wheezing or rhonchi  ABDOMEN: Soft, non-tender, non-distended EXTREMITIES: 1+ edema; No deformity   ASSESSMENT AND PLAN:    Assessment & Plan # Paroxysmal Atrial Fibrillation: Status post PVI in 2019. Redo ablation on 08/01/2023.  She was found to have reconnection of the LSPV, LIPV, RSPV.  All pulmonary veins were isolated and posterior wall was isolated. # Typical atrial flutter: Status post CTI ablation in 2019.  CTI remained blocked on redo ablation 08/01/2023. -Discussed treatment options today for AF including antiarrhythmic drug therapy and ablation. Discussed risks, recovery and likelihood of success with each treatment strategy. Risk, benefits, and alternatives to EP study and ablation for afib were discussed. These risks include but are not limited to stroke, bleeding, vascular damage, tamponade, perforation, damage to the esophagus, lungs, phrenic nerve and other structures, pulmonary vein stenosis, worsening renal function, coronary vasospasm and death.  Discussed potential need for repeat ablation procedures and antiarrhythmic drugs after an initial ablation. The patient understands these risk and wishes to proceed.  We will therefore proceed with catheter ablation at the next available time.  Carto, ICE, anesthesia are requested for the procedure.  Will also obtain CT PV protocol prior to the procedure to exclude LAA thrombus and further evaluate atrial anatomy. -Continue dofetilide  250 mcg twice daily. -Continue diltiazem  120 mg once daily.  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 -Continue Xarelto  20 mg once daily.  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  #Hypertension -At  goal today.  Recommend checking blood pressures 1-2 times per week at home and recording the values.  Recommend bringing these recordings to the primary care physician.  Follow up with Dr. Kennyth 3 months after ablation.  Signed, Fonda Kennyth, MD

## 2023-11-06 NOTE — Patient Instructions (Signed)
 Medication Instructions:  Your physician recommends that you continue on your current medications as directed. Please refer to the Current Medication list given to you today.  *If you need a refill on your cardiac medications before your next appointment, please call your pharmacy*  Follow-Up: At [facility], you and your health needs are our priority.  As part of our continuing mission to provide you with exceptional heart care, our providers are all part of one team.  This team includes your primary Cardiologist (physician) and Advanced Practice Providers or APPs (Physician Assistants and Nurse Practitioners) who all work together to provide you with the care you need, when you need it.  Your next appointment:   6 month  Provider:   You will see one of the following Advanced Practice Providers on your designated Care Team:   Charlies Arthur, PA-C Michael Andy Tillery, PA-C Suzann Riddle, NP Daphne Barrack, NP

## 2023-11-07 ENCOUNTER — Ambulatory Visit: Payer: Self-pay | Admitting: Cardiology

## 2023-11-12 ENCOUNTER — Telehealth (HOSPITAL_BASED_OUTPATIENT_CLINIC_OR_DEPARTMENT_OTHER): Payer: Self-pay

## 2023-11-12 NOTE — Telephone Encounter (Signed)
 CMN received for CPAP supplies signed by provider and faxed confirmation received

## 2023-11-14 ENCOUNTER — Telehealth: Payer: Self-pay | Admitting: Cardiology

## 2023-11-14 NOTE — Telephone Encounter (Signed)
 Spoke with the patient who states that when she saw Dr. Kennyth on 11/4 she had been doing good. She had only had one episode of atrial fibrillation since her ablation 07/2023. She states at that time she had not had any PAC's for several weeks. She states that she discussed stopping her diltiazem  120 mg with Dr. Kennyth at that appointment. She reports that she has not been taking it since that appointment, although notes do not reflect that this was the recommendation.  Patient states that on 11/7 she had an episode of atrial fibrillation that lasted about 4 hours. She was able to convert back to normal rhythm after two doses of short acting diltiazem  30 mg. She states that on 11/9 she started having atrial bigeminy for a couple of hours. She was able to catch these on her smart watch. She states that yesterday she felt like she was having PAC's - about every 10-12 beats. She reports after a walk today she could feel her heart beating hard in her chest and was very aware of her heart beats. She reports heart rate has been in the 70s. She states that she will send a transmission from her pacemaker as well as MyChart message with watch readings. Patient reports that she would be okay with restarting her diltiazem  since she tolerated it well, however it previously has not helped to suppress her PAC's. She would like recommendations from Dr. Kennyth.

## 2023-11-14 NOTE — Telephone Encounter (Signed)
 Remote transmission received. Normal device function. Presenting rhythm appears bigeminy. AT/AF burden 1.4%, all on 11/06/2023.

## 2023-11-14 NOTE — Telephone Encounter (Signed)
 Patient c/o Palpitations: STAT if patient c/o lightheadedness, shortness of breath, or chest pain  How long have you had palpitations/irregular HR/ Afib? Are you having the symptoms now? Since Sunday.   Are you currently experiencing lightheadedness, SOB or CP? No but pt stated she can feel that something was off.   Do you have a history of afib (atrial fibrillation) or irregular heart rhythm? Yes  Have you checked your BP or HR? (document readings if available): 114/62 this afternoon and HR was about 65 she stated   Are you experiencing any other symptoms? No

## 2023-11-16 MED ORDER — METOPROLOL SUCCINATE ER 50 MG PO TB24: 50.0000 mg | ORAL_TABLET | Freq: Two times a day (BID) | ORAL | 3 refills | Status: DC

## 2023-11-16 NOTE — Telephone Encounter (Signed)
 I called the patient and she stated that on 11/12 she spoke with [facility] and I do see her note. The patient is stating she is not feeling well and has been in bigeminy that is not constant. But when she is in that rhythm, she feels exhausted, heart is pounding and she is feeling quite anxious about it. She sent over a remote transmission and is requesting Dr. Kennyth to call her to ease her anxiety. She wants to see if he can also change her medication or if she needs to come in and see him in the office. I am forwarding this to Dr. Kennyth and his nurse for any advise or decisions on plan of care.

## 2023-11-16 NOTE — Telephone Encounter (Signed)
 Spoke with the patient and advised on recommendations from Dr. Kennyth. She is in agreement with the plan however she has multiple questions and concerns. She would like to have a call with Dr. Kennyth.

## 2023-11-16 NOTE — Telephone Encounter (Signed)
Virtual visit has been scheduled. 

## 2023-11-16 NOTE — Telephone Encounter (Signed)
 Pt is calling back to f/u pt states she is not feeling quite the same please advise

## 2023-11-19 ENCOUNTER — Other Ambulatory Visit: Payer: Self-pay | Admitting: Internal Medicine

## 2023-11-21 ENCOUNTER — Ambulatory Visit: Attending: Cardiology | Admitting: Cardiology

## 2023-11-21 DIAGNOSIS — I491 Atrial premature depolarization: Secondary | ICD-10-CM

## 2023-11-21 DIAGNOSIS — Z79899 Other long term (current) drug therapy: Secondary | ICD-10-CM

## 2023-11-21 DIAGNOSIS — D6869 Other thrombophilia: Secondary | ICD-10-CM

## 2023-11-21 DIAGNOSIS — I480 Paroxysmal atrial fibrillation: Secondary | ICD-10-CM

## 2023-11-21 DIAGNOSIS — I483 Typical atrial flutter: Secondary | ICD-10-CM

## 2023-11-21 NOTE — Progress Notes (Signed)
 Electrophysiology Office Note:   Date:  11/21/2023  ID:  Veronica Bradford, DOB 06-16-1945, MRN [id number]  Primary Cardiologist: None Electrophysiologist: Fonda Kitty, MD     I have seen the patient via virtual consultation today.  The patient expressed their consent to participate in today's virtual consult.  Patient did not login to care agility for video conference call, so telephone encounter only was performed.  History of Present Illness:   Veronica Bradford is a 78 y.o. 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 Fernande.  Since last seen in clinic, patient reports increased frequency of PACs, often in a bigeminal pattern.  She also had 1 episode of atrial fibrillation lasting hours.  She is very concerned about the ectopy and her atrial fibrillation.  Is most often notable at night and sometimes awakens her from sleep.  Functional status is lessened when having frequent ectopy or in AF.  Her blood pressure has decreased as she has lost weight over the past few months.  Noting that her blood pressure is often in the low 100s.  She has weaned herself off valsartan .  She was able to start metoprolol  and increase her dose up to 50 mg twice daily.  She states that metoprolol  does provide a discernible improvement in her ectopy, but often does not last the full 12 hours between doses.  Review of systems complete and found to be negative unless listed in HPI.   EP Information / Studies Reviewed:    EKG is not ordered today. EKG from 11/06/23 reviewed which showed atrial paced rhythm with prolonged AV conduction and right bundle branch block.      Echo 02/11/2021: 1. Left ventricular ejection fraction, by estimation, is 65 to 70%. The  left ventricle has normal function. The left ventricle has no  regional  wall motion abnormalities. There is moderate asymmetric left ventricular  hypertrophy of the basal-septal  segment. Left ventricular diastolic parameters are indeterminate.   2. Right ventricular systolic function is normal. The right ventricular  size is mildly enlarged. There is normal pulmonary artery systolic  pressure. The estimated right ventricular systolic pressure is 30.2 mmHg.   3. The mitral valve is normal in structure. Trivial mitral valve  regurgitation. No evidence of mitral stenosis.   4. The aortic valve is tricuspid. Aortic valve regurgitation is not  visualized. No aortic stenosis is present.   5. The inferior vena cava is normal in size with greater than 50%  respiratory variability, suggesting right atrial pressure of 3 mmHg.   Risk Assessment/Calculations:    CHA2DS2-VASc Score = 6   This indicates a 9.7% annual risk of stroke. The patient's score is based upon: CHF History: 1 HTN History: 1 Diabetes History: 0 Stroke History: 0 Vascular Disease History: 1 Age Score: 2 Gender Score: 1          ASSESSMENT AND PLAN:    Assessment & Plan # Paroxysmal Atrial Fibrillation: Status post PVI in 2019. Redo ablation on 08/01/2023.  She was found to have reconnection of the LSPV, LIPV, RSPV.  All pulmonary veins were isolated and posterior wall was isolated. # Typical atrial flutter: Status post CTI ablation in 2019.  CTI remained blocked on redo ablation 08/01/2023. # Frequent PACs # High risk medication use: Dofetilide . Qtc stable on last ECG. # Tachycardia-bradycardia  syndrome status post pacemaker - We had a lengthy discussion regarding options for PAC suppression, which appears to be her most bothersome symptom at this time.  She had previously been on flecainide .  This may be more successful at suppressing ectopy then her dofetilide .  We discussed stopping dofetilide  and retrialing flecainide  now that she has had a redo ablation for her atrial  fibrillation.  She does have long PR and right bundle branch block but has pacemaker in place.  We will stop dofetilide  and wait 48 hours, then restart flecainide  100 mg twice daily.  She will return to clinic 1 week later for ECG.  We may need to reprogram her device to shorten AV delays, which may result in ventricular pacing but she does have a left bundle branch area pacing lead.  We also discussed that a dedicated catheter ablation for PAC could be performed if burden is high enough. - Continue metoprolol  XL 50 mg twice daily. 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 -Continue Xarelto  20 mg once daily.  #Hypertension - Blood pressures have significantly improved since losing weight.  She will remain off valsartan .  We will also stop spironolactone  for right now and she will continue to monitor her blood pressures.  If blood pressures become elevated then she will resume spironolactone  12.5 mg once daily.  These changes should allow for her to tolerate full doses of metoprolol .   Follow up with Dr. Kennyth in 1 month.  Fonda Kennyth, MD 11/21/2023 11:59 AM

## 2023-11-22 ENCOUNTER — Ambulatory Visit: Payer: Self-pay

## 2023-11-22 MED ORDER — FLECAINIDE ACETATE 100 MG PO TABS: 100.0000 mg | ORAL_TABLET | Freq: Two times a day (BID) | ORAL | 3 refills | Status: DC

## 2023-11-22 NOTE — Telephone Encounter (Signed)
 FYI Only or Action Required?: FYI only for provider: appointment scheduled on 11/27/23.  Patient was last seen in primary care on 10/31/2023 by Veronica Aloysius BRAVO, MD.  Called Nurse Triage reporting Breast Pain.  Symptoms began several weeks ago.  Interventions attempted: Nothing.  Symptoms are: unchanged.  Triage Disposition: See PCP Within 2 Weeks  Patient/caregiver understands and will follow disposition?: Yes  Copied from CRM [id number]. Topic: Clinical - Red Word Triage >> Nov 22, 2023  1:18 PM Zy'onna H wrote: [facility] that prompted transfer to Nurse Triage: Patient is having unusual sensation in her breast - Has had it for the past 3 weeks - Tenderness to the touch - Brushing up against it burns  **Looking to schedule appt.** **Transf. To NT** Reason for Disposition  [1] Breast pain or tenderness AND [2] occurs monthly before menstrual period AND [3] has NOT been evaluated by a doctor (or NP/PA)  Answer Assessment - Initial Assessment Questions No available appt with pcp. Scheduled with alternative provider, 11/27/23.  Advised call back or UC/ED if symptoms worsen.  1. SYMPTOM: What's the main symptom you're concerned about?  (e.g., lump, nipple discharge, pain, rash)     Burning sensation only with palpation; can't see anything 2. LOCATION: Where is the  located?     Right breast 3. ONSET: When did   start?     3 weeks ago 4. PRIOR HISTORY: Do you have any history of prior problems with your breasts? (e.g., breast cancer, breast implant, fibrocystic breast disease)     Mother breast cancer 5. CAUSE: What do you think is causing this symptom?     unsure 6. OTHER SYMPTOMS: Do you have any other symptoms? (e.g., breast pain, fever, nipple discharge, redness or rash)     Denies nipple discharge, redness, fever, chills, n/v  Protocols used: Breast Symptoms-A-AH

## 2023-11-22 NOTE — Telephone Encounter (Signed)
 Appt scheduled

## 2023-11-23 ENCOUNTER — Other Ambulatory Visit: Payer: Self-pay

## 2023-11-27 ENCOUNTER — Other Ambulatory Visit: Payer: Self-pay | Admitting: Cardiology

## 2023-11-27 ENCOUNTER — Ambulatory Visit: Admitting: Family Medicine

## 2023-11-27 ENCOUNTER — Encounter: Payer: Self-pay | Admitting: Family Medicine

## 2023-11-27 VITALS — BP 122/80 | HR 60 | Temp 98.2°F | Resp 18 | Ht 67.5 in | Wt 228.0 lb

## 2023-11-27 DIAGNOSIS — N644 Mastodynia: Secondary | ICD-10-CM

## 2023-11-27 DIAGNOSIS — M792 Neuralgia and neuritis, unspecified: Secondary | ICD-10-CM

## 2023-11-27 MED ORDER — PRAVASTATIN SODIUM 40 MG PO TABS: 40.0000 mg | ORAL_TABLET | Freq: Every evening | ORAL | 3 refills | Status: AC

## 2023-11-27 MED ORDER — VALACYCLOVIR HCL 1 G PO TABS: 1000.0000 mg | ORAL_TABLET | Freq: Three times a day (TID) | ORAL | 0 refills | Status: DC

## 2023-11-27 NOTE — Progress Notes (Signed)
 +  Subjective:    Patient ID: Veronica Bradford, female    DOB: Jan 22, 1945, 78 y.o.   MRN: [id number]  Chief Complaint  Patient presents with   Breast Pain    Right breast, x1 month, pain with touch and tingling sensation. No redness no discharge from the nipple.     HPI Patient is in today for funny feeling in R breast x 2 days. No rash.  Discussed the use of AI scribe software for clinical note transcription with the patient, who gave verbal consent to proceed.  History of Present Illness Veronica Bradford is a 78 year old female who presents with an unusual sensation on her right breast.  She has been experiencing an unusual sensation on her right breast for the past month, described as extra sensitivity and a burning sensation when touched or when fabric or water  from the shower contacts it. The sensation is localized to a specific area and does not radiate. Her last mammogram in March was negative.  She has a history of a severe reaction to the Zostavax shingles vaccine, which included symptoms resembling meningitis, such as a headache for four days, vomiting, and inability to move her head or neck. Due to this reaction, she did not receive the second dose of the vaccine.  She is currently on Xarelto  and has recently been taken off Tikosyn  following an ablation three months ago. She was experiencing atrial bigeminy and PACs, which led to a change in her medication to flecainide  and metoprolol  50 mg, resulting in low blood pressure. She has stopped taking valsartan  and spironolactone  due to low blood pressure and is cautious about her movements to avoid dizziness.  She has been on a GLP-1 medication for three months, which has contributed to weight loss. She monitors her protein intake and calorie consumption using a Weight Watchers app. She experiences gastrointestinal issues, particularly in the first few days after taking the medication.  Her family history includes breast, ovarian, and bowel  cancer in her mother. She does not have ovaries but is concerned about her breast and bowel health.    Past Medical History:  Diagnosis Date   Atrial fibrillation (HCC)    Atrial fibrillation (HCC)    Back pain    CHF (congestive heart failure) (HCC)    grade 2 HF   Chronic sinusitis    s/p 2 surgeries remotely, Dr Ethyl   Closed fracture of metatarsal of left foot    L foot, fifth metatarsal   Complication of anesthesia    woke up once during procedure   Dysrhythmia    a-fib   Ectopic pregnancy    Endometrial cancer (HCC) 2004   Early diagnosis   GERD (gastroesophageal reflux disease)    Hyperlipemia    Hypertension    IBS (irritable bowel syndrome) 08/13/2014   Dx 2015, diarrhea on-off, Dr Donnald    Lumbar radiculopathy 08/2012   MRI in 08/2012   Mild hyperparathyroidism    Osteopenia    PAF (paroxysmal atrial fibrillation) (HCC)    Presence of permanent cardiac pacemaker    Sciatica of right side 08/2013   Received Depomedrol 80 mg injection   Sleep apnea 09/09/2013   Dx with OSA in 2015, by Dr. Fernande,   wears CPAPAPAP    Past Surgical History:  Procedure Laterality Date   ATRIAL FIBRILLATION ABLATION N/A 09/04/2017   Procedure: ATRIAL FIBRILLATION ABLATION;  Surgeon: Kelsie Agent, MD;  Location: [facility] IN[facility]ASIVE CV [facility];  Service: Cardiovascular;  Laterality: N/A;   ATRIAL FIBRILLATION ABLATION N/A 08/01/2023   Procedure: ATRIAL FIBRILLATION ABLATION;  Surgeon: Kennyth Chew, MD;  Location: [facility] CV LAB;  Service: Cardiovascular;  Laterality: N/A;   BIOPSY  03/11/2020   Procedure: BIOPSY;  Surgeon: Donnald Charleston, MD;  Location: [facility];  Service: Endoscopy;;  EGD and COLON   CARPAL TUNNEL RELEASE Right 06/17/2019   CARPAL TUNNEL RELEASE Left 11/2020   CHOLECYSTECTOMY  ~2006   COLONOSCOPY WITH PROPOFOL  N/A 03/11/2020   Procedure: COLONOSCOPY WITH PROPOFOL ;  Surgeon: Donnald Charleston, MD;  Location: [facility];  Service: Endoscopy;  Laterality: N/A;    DILATION AND CURETTAGE OF UTERUS     after SAB   ESOPHAGOGASTRODUODENOSCOPY  02/09/2014   Dr. Donnald   ESOPHAGOGASTRODUODENOSCOPY (EGD) WITH PROPOFOL  N/A 03/11/2020   Procedure: ESOPHAGOGASTRODUODENOSCOPY (EGD) WITH PROPOFOL ;  Surgeon: Donnald Charleston, MD;  Location: [facility];  Service: Endoscopy;  Laterality: N/A;   FINGER SURGERY Left    index   NASAL SINUS SURGERY     x 2 remotely, Dr Ethyl   PACEMAKER IMPLANT N/A 09/13/2020   Procedure: PACEMAKER IMPLANT;  Surgeon: Fernande Elspeth BROCKS, MD;  Location: [facility] INVASIVE CV LAB;  Service: Cardiovascular;  Laterality: N/A;   PARATHYROIDECTOMY Left 09/14/2022   Procedure: LEFT PARATHYROIDECTOMY;  Surgeon: Eletha Boas, MD;  Location: [facility];  Service: General;  Laterality: Left;   TONSILLECTOMY     TOTAL ABDOMINAL HYSTERECTOMY W/ BILATERAL SALPINGOOPHORECTOMY     TOTAL HIP ARTHROPLASTY Left 07/18/2021   Procedure: TOTAL HIP ARTHROPLASTY ANTERIOR APPROACH;  Surgeon: Fidel Rogue, MD;  Location: [facility];  Service: Orthopedics;  Laterality: Left;   TOTAL KNEE ARTHROPLASTY Right ~2008    Family History  Problem Relation Age of Onset   Hypertension Mother    Colon cancer Mother    Breast cancer Mother    Ovarian cancer Mother    Hypertension Father    Diabetes Father    Head & neck cancer Sister    CAD Neg Hx     Social History   Socioeconomic History   Marital status: Married    Spouse name: Lynnett Langlinais   Number of children: 1   Years of education: Not on file   Highest education level: Associate degree: occupational, scientist, product/process development, or vocational program  Occupational History   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  Tobacco Use   Smoking status: Former    Current packs/day: 0.00    Types: Cigarettes    Quit date: 01/06/1980    Years since quitting: 43.9   Smokeless tobacco: Never   Tobacco comments:    smoked from 1970 to 1982, less than 1 ppd  Vaping Use   Vaping status: Never Used  Substance and Sexual Activity   Alcohol  use:  Yes    Alcohol /week: 1.0 standard drink of alcohol     Types: 1 Glasses of wine per week    Comment: rare   Drug use: Never   Sexual activity: Not Currently    Birth control/protection: None  Other Topics Concern   Not on file  Social History Narrative   Lives w/ husband, and son Marolyn   Retired engineer, civil (consulting)   Lives in [place]   Social Drivers of Health   Financial Resource Strain: Low Risk  (07/23/2023)   Overall Financial Resource Strain (CARDIA)    Difficulty of Paying Living Expenses: Not hard at all  Food Insecurity: No Food Insecurity (07/23/2023)   Hunger Vital Sign    Worried About Running Out of Food in  the Last Year: Never true    Ran Out of Food in the Last Year: Never true  Transportation Needs: No Transportation Needs (07/23/2023)   PRAPARE - Administrator, Civil Service (Medical): No    Lack of Transportation (Non-Medical): No  Physical Activity: Insufficiently Active (07/23/2023)   Exercise Vital Sign    Days of Exercise per Week: 4 days    Minutes of Exercise per Session: 20 min  Stress: No Stress Concern Present (07/23/2023)   [employer] of Occupational Health - Occupational Stress Questionnaire    Feeling of Stress: Only a little  Social Connections: Moderately Integrated (07/23/2023)   Social Connection and Isolation Panel    Frequency of Communication with Friends and Family: More than three times a week    Frequency of Social Gatherings with Friends and Family: More than three times a week    Attends Religious Services: 1 to 4 times per year    Active Member of [employer] or Organizations: No    Attends Engineer, Structural: Not on file    Marital Status: Married  Recent Concern: Social Connections - Socially Isolated (06/27/2023)   Social Connection and Isolation Panel    Frequency of Communication with Friends and Family: More than three times a week    Frequency of Social Gatherings with Friends and Family: More than three times a week     Attends Religious Services: Never    Database Administrator or Organizations: No    Attends Banker Meetings: Never    Marital Status: Widowed  Intimate Partner Violence: Not At Risk (06/27/2023)   Humiliation, Afraid, Rape, and Kick questionnaire    Fear of Current or Ex-Partner: No    Emotionally Abused: No    Physically Abused: No    Sexually Abused: No    Outpatient Medications Prior to Visit  Medication Sig Dispense Refill   acetaminophen  (TYLENOL ) 500 MG tablet Take 500 mg by mouth every 6 (six) hours as needed for moderate pain or headache.     acetaminophen -codeine  (TYLENOL  #3) 300-30 MG tablet Take 1 tablet by mouth at bedtime as needed for moderate pain (pain score 4-6). 30 tablet 0   ARTIFICIAL TEAR SOLUTION OP Place 1 drop into both eyes 4 (four) times daily as needed (dry/irritated eyes.).     ascorbic acid (VITAMIN C) 500 MG tablet Take 500 mg by mouth 2 (two) times daily. Take with Iron     B Complex-C (SUPER B COMPLEX/VITAMIN C PO) Take 1 tablet by mouth daily.     Cholecalciferol  (VITAMIN D3) 50 MCG (2000 UT) capsule Take 2,000 Units by mouth daily.     Coenzyme Q10 (COQ10) 200 MG CAPS Take 200 mg by mouth daily.     colestipol (COLESTID) 1 g tablet Take 2 g by mouth 2 (two) times daily.     CRANBERRY EXTRACT PO Take 1 tablet by mouth 2 (two) times daily.     diphenoxylate-atropine  (LOMOTIL) 2.5-0.025 MG tablet Take 1 tablet by mouth 4 (four) times daily as needed for diarrhea or loose stools.     estradiol  (ESTRACE ) 0.1 MG/GM vaginal cream Place 1 Applicatorful vaginally 3 (three) times a week. 42.5 g 12   ferrous fumarate  (HEMOCYTE - 106 MG FE) 325 (106 Fe) MG TABS tablet Take 1 tablet (106 mg of iron total) by mouth daily. Take apart from pantoprazole      flecainide  (TAMBOCOR ) 100 MG tablet Take 1 tablet (100 mg total) by mouth  2 (two) times daily. 180 tablet 3   fluticasone  (FLONASE ) 50 MCG/ACT nasal spray Place 1 spray into both nostrils daily.      furosemide  (LASIX ) 40 MG tablet TAKE 1 TABLET BY MOUTH AS NEEDED 90 tablet 3   MAGNESIUM  GLYCINATE PO Take 300 mg by mouth daily as needed (upset stomach).     metoprolol  succinate (TOPROL -XL) 50 MG 24 hr tablet Take 1 tablet (50 mg total) by mouth in the morning and at bedtime. Take with or immediately following a meal. 180 tablet 3   olopatadine  (PATANOL) 0.1 % ophthalmic solution Place 1 drop into both eyes daily as needed (allergies).     pantoprazole  (PROTONIX ) 40 MG tablet Take 1 tablet (40 mg total) by mouth 2 (two) times daily.     potassium chloride  SA (KLOR-CON  M) 20 MEQ tablet Take 1 tablet (20 mEq total) by mouth daily. 90 tablet 2   pravastatin  (PRAVACHOL ) 40 MG tablet Take 1 tablet (40 mg total) by mouth every evening. 90 tablet 3   sucralfate  (CARAFATE ) 1 g tablet Take 1 g by mouth 2 (two) times daily as needed (upset stomach).     tirzepatide  (ZEPBOUND ) 7.5 MG/0.5ML Pen Inject 7.5 mg into the skin once a week. 2 mL 3   XARELTO  20 MG TABS tablet TAKE 1 TABLET(20 MG) BY MOUTH DAILY WITH SUPPER 90 tablet 1   No facility-administered medications prior to visit.    Allergies  Allergen Reactions   Ibuprofen Other (See Comments)    Pt is on Xarelto    Lactose Intolerance (Gi) Diarrhea and Nausea And Vomiting   Lisinopril Cough   Nsaids Other (See Comments)    Pt on Xarelto    Tape     Causes blisters    Tolmetin Other (See Comments)    Pt on Xarelto    Zoster Vaccine Live Other (See Comments)    Serum sickness, high fever, neck pain    Amoxicillin Rash   Penicillins Rash    Local reaction    Review of Systems  Constitutional:  Negative for chills, fever and malaise/fatigue.  HENT:  Negative for congestion and hearing loss.   Eyes:  Negative for blurred vision and discharge.  Respiratory:  Negative for cough, sputum production and shortness of breath.   Cardiovascular:  Negative for chest pain, palpitations and leg swelling.  Gastrointestinal:  Negative for abdominal  pain, blood in stool, constipation, diarrhea, heartburn, nausea and vomiting.  Genitourinary:  Negative for dysuria, frequency, hematuria and urgency.  Musculoskeletal:  Negative for back pain, falls and myalgias.  Skin:  Negative for itching and rash.  Neurological:  Positive for sensory change. Negative for dizziness, loss of consciousness, weakness and headaches.  Endo/Heme/Allergies:  Negative for environmental allergies. Does not bruise/bleed easily.  Psychiatric/Behavioral:  Negative for depression and suicidal ideas. The patient is not nervous/anxious and does not have insomnia.        Objective:    Physical Exam Vitals and nursing note reviewed.  Constitutional:      General: She is not in acute distress.    Appearance: Normal appearance. She is well-developed.  HENT:     Head: Normocephalic and atraumatic.  Eyes:     General: No scleral icterus.       Right eye: No discharge.        Left eye: No discharge.  Cardiovascular:     Rate and Rhythm: Normal rate and regular rhythm.     Heart sounds: No murmur heard. Pulmonary:  Effort: Pulmonary effort is normal. No respiratory distress.     Breath sounds: Normal breath sounds.  Musculoskeletal:        General: Normal range of motion.     Cervical back: Normal range of motion and neck supple.     Right lower leg: No edema.     Left lower leg: No edema.  Skin:    General: Skin is warm and dry.  Neurological:     Mental Status: She is alert and oriented to person, place, and time.  Psychiatric:        Mood and Affect: Mood normal.        Behavior: Behavior normal.        Thought Content: Thought content normal.        Judgment: Judgment normal.     BP 122/80 (BP Location: Right Arm, Patient Position: Sitting, Cuff Size: Large)   Pulse 60   Temp 98.2 F (36.8 C) (Oral)   Resp 18   Ht 5' 7.5 (1.715 m)   Wt 228 lb (103.4 kg)   SpO2 97%   BMI 35.18 kg/m  Wt Readings from Last 3 Encounters:  11/27/23 228 lb  (103.4 kg)  11/06/23 231 lb (104.8 kg)  10/31/23 232 lb 8 oz (105.5 kg)    Diabetic Foot Exam - Simple   No data filed    Lab Results  Component Value Date   WBC 5.2 07/25/2023   HGB 13.2 07/25/2023   HCT 39.9 07/25/2023   PLT 244.0 07/25/2023   GLUCOSE 87 10/31/2023   CHOL 154 07/25/2023   TRIG 74.0 07/25/2023   HDL 63.60 07/25/2023   LDLCALC 76 07/25/2023   ALT 15 07/25/2023   AST 19 07/25/2023   NA 141 10/31/2023   K 4.7 10/31/2023   CL 105 10/31/2023   CREATININE 0.70 10/31/2023   BUN 15 10/31/2023   CO2 26 10/31/2023   TSH 1.28 07/24/2022   INR 1.2 07/14/2021   HGBA1C 4.4 (L) 09/06/2022    Lab Results  Component Value Date   TSH 1.28 07/24/2022   Lab Results  Component Value Date   WBC 5.2 07/25/2023   HGB 13.2 07/25/2023   HCT 39.9 07/25/2023   MCV 90.9 07/25/2023   PLT 244.0 07/25/2023   Lab Results  Component Value Date   NA 141 10/31/2023   K 4.7 10/31/2023   CO2 26 10/31/2023   GLUCOSE 87 10/31/2023   BUN 15 10/31/2023   CREATININE 0.70 10/31/2023   BILITOT 0.8 07/25/2023   ALKPHOS 58 07/25/2023   AST 19 07/25/2023   ALT 15 07/25/2023   PROT 6.3 07/25/2023   ALBUMIN 4.1 07/25/2023   CALCIUM  9.0 10/31/2023   ANIONGAP 9 12/02/2022   EGFR 92 07/19/2022   GFR 84.34 10/31/2023   Lab Results  Component Value Date   CHOL 154 07/25/2023   Lab Results  Component Value Date   HDL 63.60 07/25/2023   Lab Results  Component Value Date   LDLCALC 76 07/25/2023   Lab Results  Component Value Date   TRIG 74.0 07/25/2023   Lab Results  Component Value Date   CHOLHDL 2 07/25/2023   Lab Results  Component Value Date   HGBA1C 4.4 (L) 09/06/2022       Assessment & Plan:  Breast pain -     US  LIMITED ULTRASOUND INCLUDING AXILLA RIGHT BREAST; Future -     MM 3D DIAGNOSTIC MAMMOGRAM UNILATERAL RIGHT BREAST; Future  Neuropathic pain -  valACYclovir  HCl; Take 1 tablet (1,000 mg total) by mouth 3 (three) times daily.  Dispense: 21  tablet; Refill: 0  Assessment and Plan Assessment & Plan Right breast neuropathic pain and hypersensitivity   She experiences an unusual sensation in the right breast, described as extra sensitive and burning, for one month. There are no visible or palpable abnormalities. The differential includes a mild shingles precursor due to burning pain and sensitivity. She had a previous severe reaction to Zostavax, but Shingrix is non-live and may not cause similar reactions. There is no evidence of rash, but pain could occur without it. A diagnostic mammogram is ordered at the breast center, and Valtrex  is prescribed for 7 days.  Atrial arrhythmias (atrial bigeminy, PACs) post-ablation and history of atrial fibrillation   She experiences atrial bigeminy and PACs post-ablation. Previously on Tikosyn , she is now switched to flecainide  and metoprolol , with significant improvement in symptoms. There are concerns about low blood pressure due to metoprolol . She should continue flecainide  and metoprolol  and discuss with her cardiologist about adjusting the metoprolol  dosage if blood pressure remains low.  Low blood pressure secondary to metoprolol  and weight loss   Her low blood pressure is likely due to metoprolol  and recent weight loss, with readings as low as 94/50. Symptoms include feeling lightheaded upon standing. She is advised to monitor blood pressure closely and discuss with her cardiologist about reducing metoprolol  dosage if blood pressure remains low.  Obesity on GLP-1 agonist therapy   She has been on GLP-1 agonist therapy for three months, resulting in weight loss, with no adverse effects related to breast sensitivity. She manages her diet with the Weight Watchers app to ensure adequate protein intake. She should continue GLP-1 agonist therapy and monitor weight and dietary intake.  Lymphedema, lower extremity   Lymphedema in the lower extremity is managed with compression stockings. She should  continue using compression stockings.    Dillian Feig R Lowne Chase, DO

## 2023-11-28 ENCOUNTER — Telehealth (HOSPITAL_BASED_OUTPATIENT_CLINIC_OR_DEPARTMENT_OTHER): Payer: Self-pay

## 2023-11-28 NOTE — Telephone Encounter (Signed)
 Received request from [facility] for FF notes and sleep study faxed to [phone] confirmation received

## 2023-12-03 ENCOUNTER — Ambulatory Visit: Attending: Cardiovascular Disease

## 2023-12-03 VITALS — BP 102/66 | HR 60 | Ht 67.5 in

## 2023-12-03 DIAGNOSIS — I480 Paroxysmal atrial fibrillation: Secondary | ICD-10-CM | POA: Diagnosis not present

## 2023-12-03 NOTE — Progress Notes (Signed)
   Nurse Visit   Date of Encounter: 12/03/2023 ID: KYIA RHUDE, DOB June 08, 1945, MRN [id number]  PCP:  Amon Aloysius BRAVO, MD   [facility] Providers Cardiologist:  None Electrophysiologist:  Fonda Kitty, MD      Visit Details   VS:  BP 102/66   Ht 5' 7.5 (1.715 m)   BMI 35.18 kg/m  , BMI Body mass index is 35.18 kg/m.  Wt Readings from Last 3 Encounters:  11/27/23 228 lb (103.4 kg)  11/06/23 231 lb (104.8 kg)  10/31/23 232 lb 8 oz (105.5 kg)     Reason for visit: EKG for flecainide  start Performed today: Vitals, EKG, and Education Changes (medications, testing, etc.) : None Length of Visit: 15 minutes  Patient reports that since medications were adjusted her blood pressure has been running low. Usually 100s/60s. She has had a couple of systolic readings in the 90s. She states that she hasn't been having any dizziness or lightheadedness but feels like she has to be a bit more careful with her movements. She reports drinking 60 ounces of water  in addition to coffee/tea.  She also notes that a couple of days after starting flecainide  her palpitations have significantly decreased.   Medications Adjustments/Labs and Tests Ordered: Orders Placed This Encounter  Procedures   EKG 12-Lead   No orders of the defined types were placed in this encounter.    Signed, Mackensey Bolte Chauvigne, RN  12/03/2023 3:10 PM

## 2023-12-06 ENCOUNTER — Ambulatory Visit
Admission: RE | Admit: 2023-12-06 | Discharge: 2023-12-06 | Disposition: A | Source: Ambulatory Visit | Attending: Family Medicine | Admitting: Family Medicine

## 2023-12-06 DIAGNOSIS — N644 Mastodynia: Secondary | ICD-10-CM

## 2023-12-10 ENCOUNTER — Other Ambulatory Visit: Payer: Self-pay

## 2023-12-10 ENCOUNTER — Telehealth: Payer: Self-pay

## 2023-12-10 MED ORDER — FLECAINIDE ACETATE 50 MG PO TABS: 75.0000 mg | ORAL_TABLET | Freq: Two times a day (BID) | ORAL | 3 refills | Status: DC

## 2023-12-10 NOTE — Telephone Encounter (Signed)
-----   Message from Veronica Bradford sent at 12/09/2023  5:33 PM EST ----- Recommend dropping flecainide  to 75mg  BID.   Josh ----- Message ----- From: Chauvigne, Verlie Hellenbrand, RN Sent: 12/03/2023   3:16 PM EST To: Veronica Kitty, MD

## 2023-12-11 ENCOUNTER — Ambulatory Visit

## 2023-12-11 NOTE — Telephone Encounter (Signed)
 Spoke with the patient and she is aware of recommendations to reduce flecainide .

## 2023-12-12 LAB — CUP PACEART REMOTE DEVICE CHECK
Battery Remaining Longevity: 79 mo
Battery Remaining Percentage: 70 %
Battery Voltage: 3.01 V
Brady Statistic AP VP Percent: 34 %
Brady Statistic AP VS Percent: 53 %
Brady Statistic AS VP Percent: 1 %
Brady Statistic AS VS Percent: 12 %
Brady Statistic RA Percent Paced: 87 %
Brady Statistic RV Percent Paced: 35 %
Date Time Interrogation Session: 20251209030139
Implantable Lead Connection Status: 753985
Implantable Lead Connection Status: 753985
Implantable Lead Implant Date: 20220912
Implantable Lead Implant Date: 20220912
Implantable Lead Location: 753859
Implantable Lead Location: 753860
Implantable Lead Model: 3830
Implantable Pulse Generator Implant Date: 20220912
Lead Channel Impedance Value: 350 Ohm
Lead Channel Impedance Value: 590 Ohm
Lead Channel Pacing Threshold Amplitude: 0.75 V
Lead Channel Pacing Threshold Amplitude: 1.25 V
Lead Channel Pacing Threshold Pulse Width: 0.4 ms
Lead Channel Pacing Threshold Pulse Width: 0.4 ms
Lead Channel Sensing Intrinsic Amplitude: 1 mV
Lead Channel Sensing Intrinsic Amplitude: 9.4 mV
Lead Channel Setting Pacing Amplitude: 2 V
Lead Channel Setting Pacing Amplitude: 2.5 V
Lead Channel Setting Pacing Pulse Width: 0.4 ms
Lead Channel Setting Sensing Sensitivity: 2 mV
Pulse Gen Model: 2272
Pulse Gen Serial Number: 3956585

## 2023-12-13 ENCOUNTER — Telehealth: Payer: Self-pay | Admitting: Cardiology

## 2023-12-13 NOTE — Telephone Encounter (Signed)
 Pt called in reporting she's back in afib this evening though her HRs are in the 70-80 range. Currently on flecainide , dropped to 75mg  BID and Toprol  XL 50mg  daily. States she has had trouble with lower BPs recently in the 90s. Her systolic BP 94 currently. States she can feel that she is in afib but is familiar with her symptoms and this is her usual. Asking about using additional flecainide  as she thinks it was reduced for a prolonged PR interval. I advised with her rates <100 ok to continue her usual med regimen for now. If rates >100 then would take 1 additional flecainide . Reports she sent a remote transmission as well. She voiced understanding and thanked me for callback. Will route to primary EP MD/RN for follow up with any additional recommendations.

## 2023-12-14 NOTE — Telephone Encounter (Signed)
 Call transferred at STAT.   Pt states that she woke up this morning at 6:30a and is still in a-fib. Pt states she went back into a-fib last night around 6p. She talked to Manuelita, NP last night as on call provider. Pt states that in the past she was told that she can take an extra dose of flecainide  to help with heart rate control when she is in a-fib however recently her dose of flecainide  was reduced because of P-R interval elongation. Pt did not take extra dose of flecainide . This morning pt states that after getting up she took her blood pressure 2 separate time. 90/45 and 84/47 heart rates are around 90bpm.  Pt has not taken her morning medications bases on these readings. Pt is currently trying to hydrate to get her blood pressure up. Pt also complains of being light headed and when moving around she is short of breath which is not normal for her. Advised pt that work on elevating blood pressure in order to get in her flecainide  75mg  and metoprolol  succinate 50mg  this morning. Will further discuss with Dr. Kennyth for recommendations. Pt verbalizes understanding.

## 2023-12-14 NOTE — Telephone Encounter (Signed)
 Spoke with the patient who states that she has been in a fib or flutter since last night. Yesterday heart rates were controlled but today that have been getting as high as 105. She has not taken metoprolol  or flecainide  today. Advised patient that Dr. Kennyth would like for her to go ahead and take 100 mg of flecainide  now and again tonight. Then go back to 75 mg twice daily. Advised to increase fluid intake. She is going to send in a remote transmission from her device. She states that she also sent one in last night. Will have device clinic review her transmissions and we will call her back.

## 2023-12-14 NOTE — Telephone Encounter (Signed)
 Pt has sent in the transmission and would like a call back to go over results

## 2023-12-14 NOTE — Telephone Encounter (Signed)
 Patient c/o Palpitations:  STAT if patient reporting lightheadedness, shortness of breath, or chest pain  How long have you had palpitations/irregular HR/ Afib? Are you having the symptoms now?   Yes  Are you currently experiencing lightheadedness, SOB or CP?   Lightheadedness, a little SOB when up and walking around  Do you have a history of afib (atrial fibrillation) or irregular heart rhythm?   Yes  Have you checked your BP or HR? (document readings if available):     BP 90/47  HR 90 at rest  Are you experiencing any other symptoms?   No  Patient noted she had an ablation a few months ago and is now concerned she has been in afib since yesterday at 6:00 pm.

## 2023-12-14 NOTE — Telephone Encounter (Signed)
 Pt is calling back to f/u on getting assistance/recommendations for Dr. Kennyth. She stated she feels this is something that needs immediate attention and she hasn't heard anything since this morning after speaking with a nurse. She stated she received a message on MyChart but made it very clear she'd like to talk over the phone and not messages on MyChart. Please advise

## 2023-12-14 NOTE — Telephone Encounter (Signed)
 Remote transmission received. Presenting rhythm appears AP/VS/VP. FF present on RA lead causing mode switch to DDI resulting in low PR interval which could contribute to current symptoms. Per Dorise Brittle, [employer] Rep patient needs to have programming changes below based on current real time EGM's.  - Extend PVAB from 120 ms to 150 ms. - Changing stored EGM from A-sense Amp to Bipolar to see if presenting is easier to view.  - Decrease RA sensitivity to 0.5 mV.  Reviewed with Dr. Kennyth in office who would like patient to come into device clinic for programming changes and have [employer] Rep. present. Patient aware/Bryan Brittle made aware of Device Clinic apt made on Tuesday 12/18/23 @ 10:00 AM. Patient made aware of location, date and time.  Most recent BP: 80-90 systolic. Patient reports has consumed 70 oz. Of water  so far today. Patient would like call back in regards of starting BB with low BP readings. Advised patient I will forward back to Dr. Kennyth to advise further.

## 2023-12-14 NOTE — Telephone Encounter (Signed)
 Spoke with the patient and advised that she is not in afib or aflutter. Advised that Dr. Kennyth recommended to continue holding metoprolol  if her blood pressure remains low. She will continue flecainide  75 mg twice daily. Advised to keep appointment next week. ER precautions given for any worsening symptoms.

## 2023-12-18 ENCOUNTER — Telehealth: Payer: Self-pay | Admitting: Cardiology

## 2023-12-18 ENCOUNTER — Emergency Department (HOSPITAL_COMMUNITY)

## 2023-12-18 ENCOUNTER — Other Ambulatory Visit: Payer: Self-pay

## 2023-12-18 ENCOUNTER — Inpatient Hospital Stay (HOSPITAL_COMMUNITY)
Admission: EM | Admit: 2023-12-18 | Discharge: 2023-12-23 | DRG: 378 | Disposition: A | Discharge status: Home / Self Care | Source: Ambulatory Visit | Attending: Internal Medicine | Admitting: Internal Medicine

## 2023-12-18 ENCOUNTER — Ambulatory Visit: Attending: Internal Medicine

## 2023-12-18 DIAGNOSIS — Z808 Family history of malignant neoplasm of other organs or systems: Secondary | ICD-10-CM

## 2023-12-18 DIAGNOSIS — I5032 Chronic diastolic (congestive) heart failure: Secondary | ICD-10-CM | POA: Diagnosis present

## 2023-12-18 DIAGNOSIS — D62 Acute posthemorrhagic anemia: Secondary | ICD-10-CM | POA: Diagnosis present

## 2023-12-18 DIAGNOSIS — R0789 Other chest pain: Secondary | ICD-10-CM | POA: Diagnosis present

## 2023-12-18 DIAGNOSIS — Z803 Family history of malignant neoplasm of breast: Secondary | ICD-10-CM

## 2023-12-18 DIAGNOSIS — I10 Essential (primary) hypertension: Secondary | ICD-10-CM | POA: Diagnosis present

## 2023-12-18 DIAGNOSIS — Z800 Family history of malignant neoplasm of digestive organs: Secondary | ICD-10-CM

## 2023-12-18 DIAGNOSIS — E739 Lactose intolerance, unspecified: Secondary | ICD-10-CM | POA: Diagnosis present

## 2023-12-18 DIAGNOSIS — Z8041 Family history of malignant neoplasm of ovary: Secondary | ICD-10-CM

## 2023-12-18 DIAGNOSIS — Z96642 Presence of left artificial hip joint: Secondary | ICD-10-CM | POA: Diagnosis present

## 2023-12-18 DIAGNOSIS — Z886 Allergy status to analgesic agent status: Secondary | ICD-10-CM

## 2023-12-18 DIAGNOSIS — Z90722 Acquired absence of ovaries, bilateral: Secondary | ICD-10-CM

## 2023-12-18 DIAGNOSIS — I4719 Other supraventricular tachycardia: Secondary | ICD-10-CM | POA: Diagnosis present

## 2023-12-18 DIAGNOSIS — I495 Sick sinus syndrome: Secondary | ICD-10-CM

## 2023-12-18 DIAGNOSIS — G4733 Obstructive sleep apnea (adult) (pediatric): Secondary | ICD-10-CM | POA: Diagnosis present

## 2023-12-18 DIAGNOSIS — Z8542 Personal history of malignant neoplasm of other parts of uterus: Secondary | ICD-10-CM

## 2023-12-18 DIAGNOSIS — Z8249 Family history of ischemic heart disease and other diseases of the circulatory system: Secondary | ICD-10-CM

## 2023-12-18 DIAGNOSIS — Z90710 Acquired absence of both cervix and uterus: Secondary | ICD-10-CM

## 2023-12-18 DIAGNOSIS — Z87891 Personal history of nicotine dependence: Secondary | ICD-10-CM

## 2023-12-18 DIAGNOSIS — K648 Other hemorrhoids: Secondary | ICD-10-CM | POA: Diagnosis present

## 2023-12-18 DIAGNOSIS — K2970 Gastritis, unspecified, without bleeding: Secondary | ICD-10-CM | POA: Diagnosis present

## 2023-12-18 DIAGNOSIS — K219 Gastro-esophageal reflux disease without esophagitis: Secondary | ICD-10-CM | POA: Diagnosis present

## 2023-12-18 DIAGNOSIS — Z888 Allergy status to other drugs, medicaments and biological substances status: Secondary | ICD-10-CM

## 2023-12-18 DIAGNOSIS — I4892 Unspecified atrial flutter: Secondary | ICD-10-CM | POA: Diagnosis present

## 2023-12-18 DIAGNOSIS — D649 Anemia, unspecified: Principal | ICD-10-CM

## 2023-12-18 DIAGNOSIS — I110 Hypertensive heart disease with heart failure: Secondary | ICD-10-CM | POA: Diagnosis present

## 2023-12-18 DIAGNOSIS — K644 Residual hemorrhoidal skin tags: Secondary | ICD-10-CM | POA: Diagnosis present

## 2023-12-18 DIAGNOSIS — R262 Difficulty in walking, not elsewhere classified: Secondary | ICD-10-CM | POA: Diagnosis present

## 2023-12-18 DIAGNOSIS — Z6834 Body mass index (BMI) 34.0-34.9, adult: Secondary | ICD-10-CM

## 2023-12-18 DIAGNOSIS — R195 Other fecal abnormalities: Secondary | ICD-10-CM | POA: Diagnosis present

## 2023-12-18 DIAGNOSIS — Z7901 Long term (current) use of anticoagulants: Secondary | ICD-10-CM

## 2023-12-18 DIAGNOSIS — Z9079 Acquired absence of other genital organ(s): Secondary | ICD-10-CM

## 2023-12-18 DIAGNOSIS — K5731 Diverticulosis of large intestine without perforation or abscess with bleeding: Principal | ICD-10-CM | POA: Diagnosis present

## 2023-12-18 DIAGNOSIS — K922 Gastrointestinal hemorrhage, unspecified: Secondary | ICD-10-CM

## 2023-12-18 DIAGNOSIS — Z9049 Acquired absence of other specified parts of digestive tract: Secondary | ICD-10-CM

## 2023-12-18 DIAGNOSIS — Z79899 Other long term (current) drug therapy: Secondary | ICD-10-CM

## 2023-12-18 DIAGNOSIS — E669 Obesity, unspecified: Secondary | ICD-10-CM | POA: Diagnosis present

## 2023-12-18 DIAGNOSIS — R002 Palpitations: Secondary | ICD-10-CM

## 2023-12-18 DIAGNOSIS — E860 Dehydration: Secondary | ICD-10-CM | POA: Diagnosis present

## 2023-12-18 DIAGNOSIS — Z96651 Presence of right artificial knee joint: Secondary | ICD-10-CM | POA: Diagnosis present

## 2023-12-18 DIAGNOSIS — K449 Diaphragmatic hernia without obstruction or gangrene: Secondary | ICD-10-CM | POA: Diagnosis present

## 2023-12-18 DIAGNOSIS — Z880 Allergy status to penicillin: Secondary | ICD-10-CM

## 2023-12-18 DIAGNOSIS — I480 Paroxysmal atrial fibrillation: Secondary | ICD-10-CM

## 2023-12-18 DIAGNOSIS — D6869 Other thrombophilia: Secondary | ICD-10-CM | POA: Diagnosis present

## 2023-12-18 DIAGNOSIS — E785 Hyperlipidemia, unspecified: Secondary | ICD-10-CM | POA: Diagnosis present

## 2023-12-18 DIAGNOSIS — M8580 Other specified disorders of bone density and structure, unspecified site: Secondary | ICD-10-CM | POA: Diagnosis present

## 2023-12-18 DIAGNOSIS — Z950 Presence of cardiac pacemaker: Secondary | ICD-10-CM

## 2023-12-18 DIAGNOSIS — I4510 Unspecified right bundle-branch block: Secondary | ICD-10-CM | POA: Diagnosis present

## 2023-12-18 DIAGNOSIS — R0602 Shortness of breath: Secondary | ICD-10-CM

## 2023-12-18 DIAGNOSIS — I4819 Other persistent atrial fibrillation: Secondary | ICD-10-CM | POA: Diagnosis present

## 2023-12-18 DIAGNOSIS — Z833 Family history of diabetes mellitus: Secondary | ICD-10-CM

## 2023-12-18 DIAGNOSIS — Z7985 Long-term (current) use of injectable non-insulin antidiabetic drugs: Secondary | ICD-10-CM

## 2023-12-18 DIAGNOSIS — R5382 Chronic fatigue, unspecified: Secondary | ICD-10-CM | POA: Diagnosis present

## 2023-12-18 LAB — COMPREHENSIVE METABOLIC PANEL WITH GFR
ALT: 13 U/L (ref 0–44)
AST: 23 U/L (ref 15–41)
Albumin: 3.7 g/dL (ref 3.5–5.0)
Alkaline Phosphatase: 57 U/L (ref 38–126)
Anion gap: 11 (ref 5–15)
BUN: 13 mg/dL (ref 8–23)
CO2: 21 mmol/L — ABNORMAL LOW (ref 22–32)
Calcium: 8.9 mg/dL (ref 8.9–10.3)
Chloride: 107 mmol/L (ref 98–111)
Creatinine, Ser: 0.61 mg/dL (ref 0.44–1.00)
GFR, Estimated: >60 mL/min (ref >60)
Glucose, Bld: 82 mg/dL (ref 70–99)
Potassium: 4.1 mmol/L (ref 3.5–5.1)
Sodium: 139 mmol/L (ref 135–145)
Total Bilirubin: 0.3 mg/dL (ref 0.0–1.2)
Total Protein: 5.7 g/dL — ABNORMAL LOW (ref 6.5–8.1)

## 2023-12-18 LAB — CUP PACEART INCLINIC DEVICE CHECK
Date Time Interrogation Session: 20251216121527
Implantable Lead Connection Status: 753985
Implantable Lead Connection Status: 753985
Implantable Lead Implant Date: 20220912
Implantable Lead Implant Date: 20220912
Implantable Lead Location: 753859
Implantable Lead Location: 753860
Implantable Lead Model: 3830
Implantable Pulse Generator Implant Date: 20220912
Pulse Gen Model: 2272
Pulse Gen Serial Number: 3956585

## 2023-12-18 LAB — CBC WITH DIFFERENTIAL/PLATELET
Abs Immature Granulocytes: 0.01 K/uL (ref 0.00–0.07)
Basophils Absolute: 0 K/uL (ref 0.0–0.1)
Basophils Relative: 1 %
Eosinophils Absolute: 0.1 K/uL (ref 0.0–0.5)
Eosinophils Relative: 1 %
HCT: 28.2 % — ABNORMAL LOW (ref 36.0–46.0)
Hemoglobin: 8.6 g/dL — ABNORMAL LOW (ref 12.0–15.0)
Immature Granulocytes: 0 %
Lymphocytes Relative: 18 %
Lymphs Abs: 1 K/uL (ref 0.7–4.0)
MCH: 26.9 pg (ref 26.0–34.0)
MCHC: 30.5 g/dL (ref 30.0–36.0)
MCV: 88.1 fL (ref 80.0–100.0)
Monocytes Absolute: 0.6 K/uL (ref 0.1–1.0)
Monocytes Relative: 11 %
Neutro Abs: 3.9 K/uL (ref 1.7–7.7)
Neutrophils Relative %: 69 %
Platelets: 311 K/uL (ref 150–400)
RBC: 3.2 MIL/uL — ABNORMAL LOW (ref 3.87–5.11)
RDW: 16.2 % — ABNORMAL HIGH (ref 11.5–15.5)
WBC: 5.6 K/uL (ref 4.0–10.5)
nRBC: 0 % (ref 0.0–0.2)

## 2023-12-18 LAB — MAGNESIUM: Magnesium: 2 mg/dL (ref 1.7–2.4)

## 2023-12-18 NOTE — Telephone Encounter (Signed)
 Spoke with pt on the phone. States she is currently at the ED. States that the nurse during her pacer check sent her over d/t a flutter. Pt states she experiences SOB with any activity, even just walked a few feet. Concerned that her HGB is 8.6, states she is still waiting in the ED to be seen. Spoke with Rozelle, RN, he has made Jodie Passey who is rounding in the hospital today aware that pt is being seen in the ED.

## 2023-12-18 NOTE — Telephone Encounter (Signed)
 Pt c/o Shortness Of Breath: STAT if SOB developed within the last 24 hours or pt is noticeably SOB on the phone  1. Are you currently SOB (can you hear that pt is SOB on the phone)? no  2. How long have you been experiencing SOB? Two weeks   3. Are you SOB when sitting or when up moving around? Moving around   4. Are you currently experiencing any other symptoms?  Pressure in chest, palps.  Pt in ER. Requesting sooner appt with Dr. Kennyth than 12/30 appt.

## 2023-12-18 NOTE — ED Provider Triage Note (Signed)
 Emergency Medicine Provider Triage Evaluation Note  Veronica Bradford , a 78 y.o. female  was evaluated in triage.  Pt complains of palpitations, shortness of breath, extreme fatigue.  Was seen at the cardiovascular office today.  Typically sees Dr. Kennyth for electrophysiology.  Recently was taken off of Tikosyn  and recently had flecainide  decreased.  Now experiencing palpitations and issues with atrial tachycardia.  Concern now that pacemaker is not capturing fully.  Was referred here for workup.  Reportedly EP is aware.  Recent interrogation of the device today.  Review of Systems  Positive: Palpitations, shortness of breath, extreme fatigue Negative: Active chest pain, leg swelling  Physical Exam  BP (!) 110/59   Pulse 83   Temp (!) 97.5 F (36.4 C)   Resp 17   SpO2 100%  Gen:   Awake, no distress   Resp:  Normal effort  MSK:   Moves extremities without difficulty    Medical Decision Making  Medically screening exam initiated at 12:07 PM.  Appropriate orders placed.  Veronica Bradford was informed that the remainder of the evaluation will be completed by another provider, this initial triage assessment does not replace that evaluation, and the importance of remaining in the ED until their evaluation is complete.  78 year old female presents emergency department with concern for palpitations, shortness of breath.  Has recently had medication adjustments.  Then was experiencing atrial tachycardias, now concerned that the pacemaker is not appropriately capturing.  Was referred here from cardiovascular for evaluation.  Blood work placed.  Patient in no acute distress, blood pressure stable.  Patient evaluated sitting up in chair, fully clothed, limited PE. Orders placed. Patient was counseled that they need to remain in the ED until the completion of their work-up including a full H&P, additional testing and results of any tests.  The patient appears stable and the remainder of the encounter may be  completed by another provider.   Bari Roxie HERO, DO 12/18/23 1209

## 2023-12-18 NOTE — Progress Notes (Signed)
 Patient brought into device clinic d/t previous device alert w/ patient c/o SOB/Fatigue. Device interrogation and programming changes performed w/ [employer] Representative, [place]. Presenting Rhythm AFL/VS 91 irreg. R-R. Normal device function. Patient given ED precautions d/t rhythm and c/o SOB. Patient to visit ED directly after OV.   Previous device alert believed to be AP/VS/VP w/ FF present on RA lead causing mode switch to DDI resulting in low PR interval which could contribute to current symptoms. 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 On Xarelto  per EPIC. Current AT/AF burden 2.6%, believed to be higher d/t undersensing. With programming changes will be able to see more accurate AT/AF burden.   Given patient symptoms Dr. Cindie recommended ED precautions. Patient verbalized understanding and agreeable w/ plan. EP APP in hospital notified of patient planning to visit ED.   Programming Changes per [facility] Rep: - Atrial Max Sensitivity reprogrammed from 0.27mV to 0.28mV d/t undersensing AFL/AF waves.  - Channel 1: Stored EGM Configuration reprogrammed from Atrial Sense Amp to Atrial Bipolar.

## 2023-12-18 NOTE — ED Triage Notes (Signed)
 Pt. Stated, Veronica Bradford been in atrial flutter for about a month and I was at the vascular office and they were checking my pacemaker. Im really having trouble breathing, SOB and keeping my BP up. They sent me here to be seen by a cardiologist.

## 2023-12-18 NOTE — Patient Instructions (Signed)
 Follow up as scheduled.

## 2023-12-19 ENCOUNTER — Encounter (HOSPITAL_COMMUNITY): Payer: Self-pay | Admitting: Family Medicine

## 2023-12-19 DIAGNOSIS — I480 Paroxysmal atrial fibrillation: Secondary | ICD-10-CM | POA: Diagnosis not present

## 2023-12-19 DIAGNOSIS — I484 Atypical atrial flutter: Secondary | ICD-10-CM | POA: Diagnosis not present

## 2023-12-19 DIAGNOSIS — D62 Acute posthemorrhagic anemia: Secondary | ICD-10-CM | POA: Diagnosis present

## 2023-12-19 LAB — CBC
HCT: 27.4 % — ABNORMAL LOW (ref 36.0–46.0)
Hemoglobin: 8.3 g/dL — ABNORMAL LOW (ref 12.0–15.0)
MCH: 26.5 pg (ref 26.0–34.0)
MCHC: 30.3 g/dL (ref 30.0–36.0)
MCV: 87.5 fL (ref 80.0–100.0)
Platelets: 316 K/uL (ref 150–400)
RBC: 3.13 MIL/uL — ABNORMAL LOW (ref 3.87–5.11)
RDW: 16.3 % — ABNORMAL HIGH (ref 11.5–15.5)
WBC: 7.8 K/uL (ref 4.0–10.5)
nRBC: 0 % (ref 0.0–0.2)

## 2023-12-19 LAB — BASIC METABOLIC PANEL WITH GFR
Anion gap: 9 (ref 5–15)
BUN: 14 mg/dL (ref 8–23)
CO2: 24 mmol/L (ref 22–32)
Calcium: 8.8 mg/dL — ABNORMAL LOW (ref 8.9–10.3)
Chloride: 108 mmol/L (ref 98–111)
Creatinine, Ser: 0.59 mg/dL (ref 0.44–1.00)
GFR, Estimated: >60 mL/min (ref >60)
Glucose, Bld: 94 mg/dL (ref 70–99)
Potassium: 3.8 mmol/L (ref 3.5–5.1)
Sodium: 141 mmol/L (ref 135–145)

## 2023-12-19 LAB — POC OCCULT BLOOD, ED: Fecal Occult Blood, POC: POSITIVE — AB

## 2023-12-19 LAB — TROPONIN T, HIGH SENSITIVITY
Troponin T High Sensitivity: <15 ng/L (ref 0–19)
Troponin T High Sensitivity: <15 ng/L (ref 0–19)

## 2023-12-19 MED ORDER — ACETAMINOPHEN 500 MG PO TABS
500.0000 mg | ORAL_TABLET | Freq: Four times a day (QID) | ORAL | Status: DC | PRN
  Administered 2023-12-19 – 2023-12-21 (×2): 500 mg via ORAL
  Filled 2023-12-19 (×2): qty 1

## 2023-12-19 MED ORDER — SODIUM CHLORIDE 0.9 % IV SOLN: INTRAVENOUS | Status: AC

## 2023-12-19 MED ORDER — SUCRALFATE 1 G PO TABS
1.0000 g | ORAL_TABLET | Freq: Two times a day (BID) | ORAL | Status: DC | PRN
  Administered 2023-12-20: 17:00:00 1 g via ORAL
  Filled 2023-12-19: qty 1

## 2023-12-19 MED ORDER — ONDANSETRON HCL 4 MG/2ML IJ SOLN: 4.0000 mg | Freq: Four times a day (QID) | INTRAMUSCULAR | Status: DC | PRN

## 2023-12-19 MED ORDER — FLECAINIDE ACETATE 50 MG PO TABS
75.0000 mg | ORAL_TABLET | Freq: Two times a day (BID) | ORAL | Status: DC
  Filled 2023-12-19: qty 2

## 2023-12-19 MED ORDER — COLESTIPOL HCL 1 G PO TABS
2.0000 g | ORAL_TABLET | Freq: Two times a day (BID) | ORAL | Status: DC
  Administered 2023-12-19 – 2023-12-23 (×6): 2 g via ORAL
  Filled 2023-12-19 (×11): qty 2

## 2023-12-19 MED ORDER — METOPROLOL SUCCINATE ER 50 MG PO TB24
50.0000 mg | ORAL_TABLET | Freq: Every day | ORAL | Status: DC
  Administered 2023-12-19 – 2023-12-23 (×4): 50 mg via ORAL
  Filled 2023-12-19 (×2): qty 1
  Filled 2023-12-19: qty 2
  Filled 2023-12-19 (×2): qty 1

## 2023-12-19 MED ORDER — ONDANSETRON HCL 4 MG PO TABS: 4.0000 mg | ORAL_TABLET | Freq: Four times a day (QID) | ORAL | Status: DC | PRN

## 2023-12-19 MED ORDER — PRAVASTATIN SODIUM 40 MG PO TABS
40.0000 mg | ORAL_TABLET | Freq: Every evening | ORAL | Status: DC
  Administered 2023-12-19 – 2023-12-22 (×4): 40 mg via ORAL
  Filled 2023-12-19 (×5): qty 1

## 2023-12-19 MED ORDER — PANTOPRAZOLE SODIUM 40 MG PO TBEC
40.0000 mg | DELAYED_RELEASE_TABLET | Freq: Two times a day (BID) | ORAL | Status: DC
  Administered 2023-12-19 – 2023-12-23 (×9): 40 mg via ORAL
  Filled 2023-12-19 (×9): qty 1

## 2023-12-19 MED ORDER — FERROUS FUMARATE 324 (106 FE) MG PO TABS
1.0000 | ORAL_TABLET | Freq: Every day | ORAL | Status: DC
  Administered 2023-12-19 – 2023-12-23 (×4): 106 mg via ORAL
  Filled 2023-12-19 (×6): qty 1

## 2023-12-19 NOTE — ED Notes (Signed)
 Pt given hot tea and warm blankets.

## 2023-12-19 NOTE — ED Notes (Signed)
 Patient is now back on the monitor and she is sitting up in a chair patient has call bell in reach

## 2023-12-19 NOTE — H&P (Signed)
 History and Physical    Veronica Bradford FMW:[id number] DOB: Jan 03, 1946 DOA: 12/18/2023  PCP: Amon Aloysius BRAVO, MD  Patient coming from: Home  I have personally briefly reviewed patient's old medical records in [facility]  Chief Complaint: Shortness of breath/palpitation and chest pain  HPI: Veronica Bradford is a 78 y.o. female with medical history significant of atrial flutter on Xarelto  also status post pacemaker, chronic diastolic congestive heart failure, dyslipidemia, GERD, hypertension, obesity, OSA on CPAP presented to ED with complaint of palpitation, shortness of breath and chest pain.  Per patient, shortness of breath started about a month ago but has been gradually worsening.  She has been concerned that she has atrial flutter.  Denies any other complaint such as fever, chills, nausea, vomiting, chest pain, any problem with urination or with bowel movement.  Patient is on iron pills but denies any melena, hematochezia or hematemesis.  According to patient and per notes, patient due to having feeling of palpitation went to see device clinic today.  She was informed that the settings were not correct and the information that cardiologist was receiving was incorrect and she was told that she was in atrial flutter.  This was discussed with Dr. Cindie of cardiology who recommended for the patient to come to the emergency department.  Per their note, EP PA will see patient here.  ED Course: Upon arrival to ED, patient was hemodynamically stable, 1 brief episode of heart rate at 130 which was several hours ago, heart rate has been around 100 after that.  Not hypoxic.  BMP unremarkable.  CBC showed hemoglobin of 8.6 down from 13.24 months ago with positive FOBT.  Received no medications in the ED.  Hospitalist called for admission.  Review of Systems: As per HPI otherwise negative.    Past Medical History:  Diagnosis Date   Atrial fibrillation (HCC)    Atrial fibrillation (HCC)    Back pain     CHF (congestive heart failure) (HCC)    grade 2 HF   Chronic sinusitis    s/p 2 surgeries remotely, Dr Ethyl   Closed fracture of metatarsal of left foot    L foot, fifth metatarsal   Complication of anesthesia    woke up once during procedure   Dysrhythmia    a-fib   Ectopic pregnancy    Endometrial cancer (HCC) 2004   Early diagnosis   GERD (gastroesophageal reflux disease)    Hyperlipemia    Hypertension    IBS (irritable bowel syndrome) 08/13/2014   Dx 2015, diarrhea on-off, Dr Donnald    Lumbar radiculopathy 08/2012   MRI in 08/2012   Mild hyperparathyroidism    Osteopenia    PAF (paroxysmal atrial fibrillation) (HCC)    Presence of permanent cardiac pacemaker    Sciatica of right side 08/2013   Received Depomedrol 80 mg injection   Sleep apnea 09/09/2013   Dx with OSA in 2015, by Dr. Fernande,   wears CPAPAPAP    Past Surgical History:  Procedure Laterality Date   ATRIAL FIBRILLATION ABLATION N/A 09/04/2017   Procedure: ATRIAL FIBRILLATION ABLATION;  Surgeon: Kelsie Agent, MD;  Location: [facility] INVASIVE CV LAB;  Service: Cardiovascular;  Laterality: N/A;   ATRIAL FIBRILLATION ABLATION N/A 08/01/2023   Procedure: ATRIAL FIBRILLATION ABLATION;  Surgeon: Kennyth Chew, MD;  Location[facility] INVASIVE CV LAB;  Service: Cardiovascular;  Laterality: N/A;   BIOPSY  03/11/2020   Procedure: BIOPSY;  Surgeon: Donnald Charleston, MD;  Location: [facility] ENDOSCOPY;  Service: Endoscopy;;  EGD and COLON   CARPAL TUNNEL RELEASE Right 06/17/2019   CARPAL TUNNEL RELEASE Left 11/2020   CHOLECYSTECTOMY  ~2006   COLONOSCOPY WITH PROPOFOL  N/A 03/11/2020   Procedure: COLONOSCOPY WITH PROPOFOL ;  Surgeon: Donnald Charleston, MD;  Location: [facility] ENDOSCOPY;  Service: Endoscopy;  Laterality: N/A;   DILATION AND CURETTAGE OF UTERUS     after SAB   ESOPHAGOGASTRODUODENOSCOPY  02/09/2014   Dr. Donnald   ESOPHAGOGASTRODUODENOSCOPY (EGD) WITH PROPOFOL  N/A 03/11/2020   Procedure: ESOPHAGOGASTRODUODENOSCOPY (EGD) WITH  PROPOFOL ;  Surgeon: Donnald Charleston, MD;  Location: [facility] ENDOSCOPY;  Service: Endoscopy;  Laterality: N/A;   FINGER SURGERY Left    index   NASAL SINUS SURGERY     x 2 remotely, Dr Ethyl   PACEMAKER IMPLANT N/A 09/13/2020   Procedure: PACEMAKER IMPLANT;  Surgeon: Fernande Elspeth BROCKS, MD;  Location: [facility] INVASIVE CV LAB;  Service: Cardiovascular;  Laterality: N/A;   PARATHYROIDECTOMY Left 09/14/2022   Procedure: LEFT PARATHYROIDECTOMY;  Surgeon: Eletha Boas, MD;  Location: [facility] ORS;  Service: General;  Laterality: Left;   TONSILLECTOMY     TOTAL ABDOMINAL HYSTERECTOMY W/ BILATERAL SALPINGOOPHORECTOMY     TOTAL HIP ARTHROPLASTY Left 07/18/2021   Procedure: TOTAL HIP ARTHROPLASTY ANTERIOR APPROACH;  Surgeon: Fidel Rogue, MD;  Location: [facility] ORS;  Service: Orthopedics;  Laterality: Left;   TOTAL KNEE ARTHROPLASTY Right ~2008     reports that she quit smoking about 43 years ago. Her smoking use included cigarettes. She has never used smokeless tobacco. She reports current alcohol  use of about 1.0 standard drink of alcohol  per week. She reports that she does not use drugs.  Allergies[1]  Family History  Problem Relation Age of Onset   Hypertension Mother    Colon cancer Mother    Breast cancer Mother    Ovarian cancer Mother    Hypertension Father    Diabetes Father    Head & neck cancer Sister    CAD Neg Hx     Prior to Admission medications  Medication Sig Start Date End Date Taking? Authorizing Provider  acetaminophen  (TYLENOL ) 500 MG tablet Take 500 mg by mouth every 6 (six) hours as needed for moderate pain or headache.    [provider]  acetaminophen -codeine  (TYLENOL  #3) 300-30 MG tablet Take 1 tablet by mouth at bedtime as needed for moderate pain (pain score 4-6). 09/09/23   Webb, Padonda B, FNP  ARTIFICIAL TEAR SOLUTION OP Place 1 drop into both eyes 4 (four) times daily as needed (dry/irritated eyes.).    [provider]  ascorbic acid (VITAMIN C) 500 MG tablet  Take 500 mg by mouth 2 (two) times daily. Take with Iron    [provider]  B Complex-C (SUPER B COMPLEX/VITAMIN C PO) Take 1 tablet by mouth daily.    [provider]  Cholecalciferol  (VITAMIN D3) 50 MCG (2000 UT) capsule Take 2,000 Units by mouth daily.    [provider]  Coenzyme Q10 (COQ10) 200 MG CAPS Take 200 mg by mouth daily.    [provider]  colestipol  (COLESTID ) 1 g tablet Take 2 g by mouth 2 (two) times daily.    [provider]  CRANBERRY EXTRACT PO Take 1 tablet by mouth 2 (two) times daily.    [provider]  diphenoxylate-atropine  (LOMOTIL) 2.5-0.025 MG tablet Take 1 tablet by mouth 4 (four) times daily as needed for diarrhea or loose stools. 01/16/23   [provider]  estradiol  (ESTRACE ) 0.1 MG/GM vaginal cream  Place 1 Applicatorful vaginally 3 (three) times a week. 11/10/22   Antonio Cyndee Jamee JONELLE, DO  ferrous fumarate  (HEMOCYTE - 106 MG FE) 325 (106 Fe) MG TABS tablet Take 1 tablet (106 mg of iron total) by mouth daily. Take apart from pantoprazole  10/31/23   Amon Aloysius BRAVO, MD  flecainide  (TAMBOCOR ) 50 MG tablet Take 1.5 tablets (75 mg total) by mouth 2 (two) times daily. 12/10/23   Kennyth Chew, MD  fluticasone  (FLONASE ) 50 MCG/ACT nasal spray Place 1 spray into both nostrils daily.    [provider]  furosemide  (LASIX ) 40 MG tablet TAKE 1 TABLET BY MOUTH AS NEEDED 04/10/23   Fernande Elspeth BROCKS, MD  MAGNESIUM  GLYCINATE PO Take 300 mg by mouth daily as needed (upset stomach).    [provider]  metoprolol  succinate (TOPROL -XL) 50 MG 24 hr tablet Take 1 tablet (50 mg total) by mouth in the morning and at bedtime. Take with or immediately following a meal. 11/16/23   Kennyth Chew, MD  olopatadine  (PATANOL) 0.1 % ophthalmic solution Place 1 drop into both eyes daily as needed (allergies).    [provider]  pantoprazole  (PROTONIX ) 40 MG tablet Take 1 tablet (40 mg total) by mouth 2 (two) times  daily. 01/27/21   Dow Arland BROCKS, NP  potassium chloride  SA (KLOR-CON  M) 20 MEQ tablet Take 1 tablet (20 mEq total) by mouth daily. 10/24/23   Terra Fairy PARAS, PA-C  pravastatin  (PRAVACHOL ) 40 MG tablet Take 1 tablet (40 mg total) by mouth every evening. 11/27/23   Kennyth Chew, MD  sucralfate  (CARAFATE ) 1 g tablet Take 1 g by mouth 2 (two) times daily as needed (upset stomach).    [provider]  tirzepatide  (ZEPBOUND ) 7.5 MG/0.5ML Pen Inject 7.5 mg into the skin once a week. 10/31/23   Amon Aloysius BRAVO, MD  valACYclovir  (VALTREX ) 1000 MG tablet Take 1 tablet (1,000 mg total) by mouth 3 (three) times daily. 11/27/23   Antonio Cyndee Jamee JONELLE, DO  XARELTO  20 MG TABS tablet TAKE 1 TABLET(20 MG) BY MOUTH DAILY WITH SUPPER 07/18/23   Kennyth Chew, MD    Physical Exam: Vitals:   12/18/23 1108 12/18/23 1938 12/19/23 0033  BP: (!) 110/59 (!) 127/55   Pulse: 83 (!) 130   Resp: 17 (!) 21   Temp: (!) 97.5 F (36.4 C) (!) 97.4 F (36.3 C) 97.8 F (36.6 C)  TempSrc:   Oral  SpO2: 100% 95%     Constitutional: NAD, calm, comfortable Vitals:   12/18/23 1108 12/18/23 1938 12/19/23 0033  BP: (!) 110/59 (!) 127/55   Pulse: 83 (!) 130   Resp: 17 (!) 21   Temp: (!) 97.5 F (36.4 C) (!) 97.4 F (36.3 C) 97.8 F (36.6 C)  TempSrc:   Oral  SpO2: 100% 95%    Eyes: PERRL, lids and conjunctivae normal ENMT: Mucous membranes are moist. Posterior pharynx clear of any exudate or lesions.Normal dentition.  Neck: normal, supple, no masses, no thyromegaly Respiratory: clear to auscultation bilaterally, no wheezing, no crackles. Normal respiratory effort. No accessory muscle use.  Cardiovascular: Regular rate and rhythm. No extremity edema. 2+ pedal pulses. No carotid bruits.  Abdomen: no tenderness, no masses palpated. No hepatosplenomegaly. Bowel sounds positive.  Musculoskeletal: no clubbing / cyanosis. No joint deformity upper and lower extremities. Good ROM, no contractures. Normal muscle  tone.  Skin: no rashes, lesions, ulcers. No induration Neurologic: CN 2-12 grossly intact. Sensation intact, DTR normal. Strength 5/5 in all 4.  Psychiatric: Normal judgment and insight. Alert and oriented x 3. Normal mood.    Labs on Admission: I have personally reviewed following labs and imaging studies  CBC: Recent Labs  Lab 12/18/23 1217  WBC 5.6  NEUTROABS 3.9  HGB 8.6*  HCT 28.2*  MCV 88.1  PLT 311   Basic Metabolic Panel: Recent Labs  Lab 12/18/23 1217  NA 139  K 4.1  CL 107  CO2 21*  GLUCOSE 82  BUN 13  CREATININE 0.61  CALCIUM  8.9  MG 2.0   GFR: CrCl cannot be calculated (Unknown ideal weight.). Liver Function Tests: Recent Labs  Lab 12/18/23 1217  AST 23  ALT 13  ALKPHOS 57  BILITOT 0.3  PROT 5.7*  ALBUMIN 3.7   No results for input(s): LIPASE, AMYLASE in the last 168 hours. No results for input(s): AMMONIA in the last 168 hours. Coagulation Profile: No results for input(s): INR, PROTIME in the last 168 hours. Cardiac Enzymes: No results for input(s): CKTOTAL, CKMB, CKMBINDEX, TROPONINI in the last 168 hours. BNP (last 3 results) No results for input(s): PROBNP in the last 8760 hours. HbA1C: No results for input(s): HGBA1C in the last 72 hours. CBG: No results for input(s): GLUCAP in the last 168 hours. Lipid Profile: No results for input(s): CHOL, HDL, LDLCALC, TRIG, CHOLHDL, LDLDIRECT in the last 72 hours. Thyroid  Function Tests: No results for input(s): TSH, T4TOTAL, FREET4, T3FREE, THYROIDAB in the last 72 hours. Anemia Panel: No results for input(s): VITAMINB12, FOLATE, FERRITIN, TIBC, IRON, RETICCTPCT in the last 72 hours. Urine analysis:    Component Value Date/Time   COLORURINE YELLOW 03/07/2023 1341   APPEARANCEUR CLEAR 03/07/2023 1341   LABSPEC 1.010 03/07/2023 1341   PHURINE 6.0 03/07/2023 1341   GLUCOSEU NEGATIVE 03/07/2023 1341   HGBUR NEGATIVE 03/07/2023 1341    BILIRUBINUR negative 03/22/2023 0835   BILIRUBINUR negative 11/10/2022 1528   KETONESUR negative 03/22/2023 0835   KETONESUR NEGATIVE 03/07/2023 1341   PROTEINUR negative 03/22/2023 0835   PROTEINUR NEGATIVE 11/29/2022 1235   UROBILINOGEN 0.2 03/22/2023 0835   UROBILINOGEN 0.2 03/07/2023 1341   NITRITE Negative 03/22/2023 0835   NITRITE NEGATIVE 03/07/2023 1341   LEUKOCYTESUR Trace (A) 03/22/2023 0835   LEUKOCYTESUR NEGATIVE 03/07/2023 1341    Radiological Exams on Admission: DG Chest 1 View Result Date: 12/18/2023 CLINICAL DATA:  Chest pain EXAM: CHEST  1 VIEW COMPARISON:  November 29, 2022 FINDINGS: The heart size and mediastinal contours are within normal limits. Left-sided pacemaker is unchanged. Both lungs are clear. The visualized skeletal structures are unremarkable. IMPRESSION: No active disease. Electronically Signed   By: Lynwood Landy Raddle M.D.   On: 12/18/2023 13:03   CUP PACEART INCLINIC DEVICE CHECK Result Date: 12/18/2023 Patient brought into device clinic d/t previous device alert w/ patient c/o SOB/Fatigue. Device interrogation and programming changes performed w/ [employer] Representative, [place]. Presenting Rhythm AFL/VS 91 irreg. R-R. Normal device function. Patient given ED precautions d/t rhythm and c/o SOB. Patient to visit ED directly after OV. Previous device alert believed to be AP/VS/VP w/ FF present on RA lead causing mode switch to DDI resulting in low PR interval which could contribute to current symptoms. 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 On Xarelto  per EPIC. Current AT/AF burden 2.6%, believed to be higher d/t undersensing. With programming changes will be able to see more accurate AT/AF burden. Given patient symptoms Dr. Cindie recommended ED precautions. Patient verbalized understanding and  agreeable w/ plan. Programming Changes per [facility] Rep: - Atrial Max Sensitivity reprogrammed from 0.82mV to 0.83mV  d/t undersensing AFL/AF waves. - Channel 1: Stored EGM Configuration reprogrammed from Atrial Sense Amp to Atrial Bipolar.Rozelle Banter, BSN, RN   EKG: Independently reviewed.  Right bundle branch block.  Assessment/Plan Principal Problem:   Acute blood loss anemia   Generalized weakness/shortness of breath/acute blood loss anemia presumably secondary to upper GI bleed, POA: Patient symptoms are likely secondary to acute blood loss anemia.  No reported history of melena, hematochezia or hematemesis.  Significant drop of hemoglobin/5 g in 4 months.  FOBT positive.  Patient sees Dr. Saintclair of [facility]. I was informed by ED PA Lamar Schlossman that he has sent a secure chat message to [facility] Dr. Blas to see this patient tomorrow morning.  Resume PPI.  Chest pain: EKG appears to be with no acute changes.  Troponin x 1 is negative.  Will follow 2 more troponins.  Atrial flutter: Rates around 100.  Patient currently asymptomatic.  According to ED PA, patient was on Tikosyn  lately and has been transition to metoprolol  and flecainide  and dose has been reduced.  I will resume those medications.  Hold Xarelto  due to presumed GI bleed.  Hyperlipidemia: Pravastatin .  DVT prophylaxis: SCDs Start: 12/19/23 0033 Code Status: Secure chat sent to GI as mentioned above. Family Communication: Husband present at bedside.  Plan of care discussed with patient in length and he verbalized understanding and agreed with it. Disposition Plan: Once medically optimized Consults called: GI  Fredia Skeeter MD [facility]  *Please note that this is a verbal dictation therefore any spelling or grammatical errors are due to the Dragon Medical One system interpretation.  Please page via Amion and do not message via secure chat for urgent patient care matters. Secure chat can be used for non urgent patient care matters. 12/19/2023, 12:34 AM  To contact the attending provider between 7A-7P or the covering  provider during after hours 7P-7A, please log into the web site [email or web address]     [1]  Allergies Allergen Reactions   Ibuprofen Other (See Comments)    Pt is on Xarelto    Lactose Intolerance (Gi) Diarrhea and Nausea And Vomiting   Lisinopril Cough   Nsaids Other (See Comments)    Pt on Xarelto    Tape     Causes blisters    Tolmetin Other (See Comments)    Pt on Xarelto    Zoster Vaccine Live Other (See Comments)    Serum sickness, high fever, neck pain    Amoxicillin Rash   Penicillins Rash    Local reaction

## 2023-12-19 NOTE — Progress Notes (Signed)
 Remote PPM Transmission

## 2023-12-19 NOTE — Consult Note (Signed)
 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 female.  HPI: Patient seen and examined and her hospital computer chart and our office computer chart reviewed and she is actually doing well from a GI standpoint has not seen any signs of bleeding is on her blood thinner but no extra aspirin or nonsteroidals and her previous workup to include a CT scan in March and 2 colonoscopies and endoscopies in 2021 and 2022 were reviewed and although she has been trying to lose weight and has been on a medicine for that she has lost 25 pounds but feels good about that but has no other worrisome symptoms and her case discussed with her husband as well as she last took her blood thinner last night and her hemoglobin did drop in 4 months from 12-8  Past Medical History:  Diagnosis Date   Atrial fibrillation (HCC)    Atrial fibrillation (HCC)    Back pain    CHF (congestive heart failure) (HCC)    grade 2 HF   Chronic sinusitis    s/p 2 surgeries remotely, Dr Ethyl   Closed fracture of metatarsal of left foot    L foot, fifth metatarsal   Complication of anesthesia    woke up once during procedure   Dysrhythmia    a-fib   Ectopic pregnancy    Endometrial cancer (HCC) 2004   Early diagnosis   GERD (gastroesophageal reflux disease)    Hyperlipemia    Hypertension    IBS (irritable bowel syndrome) 08/13/2014   Dx 2015, diarrhea on-off, Dr Donnald    Lumbar radiculopathy 08/2012   MRI in 08/2012   Mild hyperparathyroidism    Osteopenia    PAF (paroxysmal atrial fibrillation) (HCC)    Presence of permanent cardiac pacemaker    Sciatica of right side 08/2013   Received Depomedrol 80 mg injection   Sleep apnea 09/09/2013   Dx with OSA in 2015, by Dr. Fernande,   wears CPAPAPAP    Past Surgical History:  Procedure Laterality Date   ATRIAL FIBRILLATION ABLATION N/A 09/04/2017   Procedure: ATRIAL FIBRILLATION  ABLATION;  Surgeon: Kelsie Agent, MD;  Location: [facility] IN[facility]ASIVE CV [facility];  Service: Cardiovascular;  Laterality: N/A;   ATRIAL FIBRILLATION ABLATION N/A 08/01/2023   Procedure: ATRIAL FIBRILLATION ABLATION;  Surgeon: Kennyth Chew, MD;  Location: [facility] INVASIVE CV LAB;  Service: Cardiovascular;  Laterality: N/A;   BIOPSY  03/11/2020   Procedure: BIOPSY;  Surgeon: Donnald Charleston, MD;  Location: [facility]OS[facility];  Service: Endoscopy;;  EGD and COLON   CARPAL TUNNEL RELEASE Right 06/17/2019   CARPAL TUNNEL RELEASE Left 11/2020   CHOLECYSTECTOMY  ~2006   COLONOSCOPY WITH PROPOFOL  N/A 03/11/2020   Procedure: COLONOSCOPY WITH PROPOFOL ;  Surgeon: Donnald Charleston, MD;  Location: [facility];  Service: Endoscopy;  Laterality: N/A;   DILATION AND CURETTAGE OF UTERUS     after SAB   ESOPHAGOGASTRODUODENOSCOPY  02/09/2014   Dr. Donnald   ESOPHAGOGASTRODUODENOSCOPY (EGD) WITH PROPOFOL  N/A 03/11/2020   Procedure: ESOPHAGOGASTRODUODENOSCOPY (EGD) WITH PROPOFOL ;  Surgeon: Donnald Charleston, MD;  Location: [facility];  Service: Endoscopy;  Laterality: N/A;   FINGER SURGERY Left    index   NASAL SINUS SURGERY     x 2 remotely, Dr Ethyl   PACEMAKER IMPLANT N/A 09/13/2020   Procedure: PACEMAKER IMPLANT;  Surgeon: Fernande Elspeth BROCKS, MD;  Location: [facility] INVASIVE CV LAB;  Service: Cardiovascular;  Laterality: N/A;  PARATHYROIDECTOMY Left 09/14/2022   Procedure: LEFT PARATHYROIDECTOMY;  Surgeon: Eletha Boas, MD;  Location: [facility];  Service: General;  Laterality: Left;   TONSILLECTOMY     TOTAL ABDOMINAL HYSTERECTOMY W/ BILATERAL SALPINGOOPHORECTOMY     TOTAL HIP ARTHROPLASTY Left 07/18/2021   Procedure: TOTAL HIP ARTHROPLASTY ANTERIOR APPROACH;  Surgeon: Fidel Rogue, MD;  Location: [facility];  Service: Orthopedics;  Laterality: Left;   TOTAL KNEE ARTHROPLASTY Right ~2008    Family History  Problem Relation Age of Onset   Hypertension Mother    Colon cancer Mother    Breast cancer Mother    Ovarian cancer Mother     Hypertension Father    Diabetes Father    Head & neck cancer Sister    CAD Neg Hx     Social History:  reports that she quit smoking about 43 years ago. Her smoking use included cigarettes. She has never used smokeless tobacco. She reports current alcohol  use of about 1.0 standard drink of alcohol  per week. She reports that she does not use drugs.  Allergies: Allergies[1]  Medications: I have reviewed the patient's current medications.  Results for orders placed or performed during the hospital encounter of 12/18/23 (from the past 48 hours)  CBC with Differential     Status: Abnormal   Collection Time: 12/18/23 12:17 PM  Result Value Ref Range   WBC 5.6 4.0 - 10.5 K/uL   RBC 3.20 (L) 3.87 - 5.11 MIL/uL   Hemoglobin 8.6 (L) 12.0 - 15.0 g/dL   HCT 71.7 (L) 63.9 - 53.9 %   MCV 88.1 80.0 - 100.0 fL   MCH 26.9 26.0 - 34.0 pg   MCHC 30.5 30.0 - 36.0 g/dL   RDW 83.7 (H) 88.4 - 84.4 %   Platelets 311 150 - 400 K/uL   nRBC 0.0 0.0 - 0.2 %   Neutrophils Relative % 69 %   Neutro Abs 3.9 1.7 - 7.7 K/uL   Lymphocytes Relative 18 %   Lymphs Abs 1.0 0.7 - 4.0 K/uL   Monocytes Relative 11 %   Monocytes Absolute 0.6 0.1 - 1.0 K/uL   Eosinophils Relative 1 %   Eosinophils Absolute 0.1 0.0 - 0.5 K/uL   Basophils Relative 1 %   Basophils Absolute 0.0 0.0 - 0.1 K/uL   Immature Granulocytes 0 %   Abs Immature Granulocytes 0.01 0.00 - 0.07 K/uL    Comment: Performed at [facility], [address] [place], [place] [address]  Comprehensive metabolic panel     Status: Abnormal   Collection Time: 12/18/23 12:17 PM  Result Value Ref Range   Sodium 139 135 - 145 mmol/L   Potassium 4.1 3.5 - 5.1 mmol/L   Chloride 107 98 - 111 mmol/L   CO2 21 (L) 22 - 32 mmol/L   Glucose, Bld 82 70 - 99 mg/dL    Comment: Glucose reference range applies only to samples taken after fasting for at least 8 hours.   BUN 13 8 - 23 mg/dL   Creatinine, Ser 9.38 0.44 - 1.00 mg/dL   Calcium  8.9 8.9 - 10.3 mg/dL    Total Protein 5.7 (L) 6.5 - 8.1 g/dL   Albumin 3.7 3.5 - 5.0 g/dL   AST 23 15 - 41 U/L    Comment: HEMOLYSIS AT THIS LEVEL MAY AFFECT RESULT   ALT 13 0 - 44 U/L   Alkaline Phosphatase 57 38 - 126 U/L   Total Bilirubin 0.3 0.0 - 1.2 mg/dL  GFR, Estimated >60 >60 mL/min    Comment: (NOTE) Calculated using the CKD-EPI Creatinine Equation (2021)    Anion gap 11 5 - 15    Comment: Performed at [facility], [address] [place], [place] [address]  Magnesium      Status: None   Collection Time: 12/18/23 12:17 PM  Result Value Ref Range   Magnesium  2.0 1.7 - 2.4 mg/dL    Comment: Performed at [facility], [address] [place], [place] [address]  Troponin T, High Sensitivity     Status: None   Collection Time: 12/18/23 11:16 PM  Result Value Ref Range   Troponin T High Sensitivity <15 0 - 19 ng/L    Comment: (NOTE) Biotin concentrations > 1000 ng/mL falsely decrease TnT results.  Serial cardiac troponin measurements are suggested.  Refer to the Links section for chest pain algorithms and additional  guidance. Performed at [facility], [address] [place], [place] [address]   POC occult blood, ED     Status: Abnormal   Collection Time: 12/19/23 12:14 AM  Result Value Ref Range   Fecal Occult Blood, POC Positive (A) Negative  Type and screen [facility]     Status: None   Collection Time: 12/19/23 12:22 AM  Result Value Ref Range   ABO/RH(D) A POS    Antibody Screen NEG    Sample Expiration      12/22/2023,2359 Performed at [facility], [address] [place], [place] [address]   CBC     Status: Abnormal   Collection Time: 12/19/23  3:02 AM  Result Value Ref Range   WBC 7.8 4.0 - 10.5 K/uL   RBC 3.13 (L) 3.87 - 5.11 MIL/uL   Hemoglobin 8.3 (L) 12.0 - 15.0 g/dL   HCT 72.5 (L) 63.9 - 53.9 %   MCV 87.5 80.0 - 100.0 fL   MCH 26.5 26.0 - 34.0 pg   MCHC 30.3 30.0 - 36.0 g/dL   RDW 83.6 (H) 88.4 - 84.4 %   Platelets 316  150 - 400 K/uL   nRBC 0.0 0.0 - 0.2 %    Comment: Performed at [facility], [address] [place], [place] [address]  Basic metabolic panel     Status: Abnormal   Collection Time: 12/19/23  3:02 AM  Result Value Ref Range   Sodium 141 135 - 145 mmol/L   Potassium 3.8 3.5 - 5.1 mmol/L   Chloride 108 98 - 111 mmol/L   CO2 24 22 - 32 mmol/L   Glucose, Bld 94 70 - 99 mg/dL    Comment: Glucose reference range applies only to samples taken after fasting for at least 8 hours.   BUN 14 8 - 23 mg/dL   Creatinine, Ser 9.40 0.44 - 1.00 mg/dL   Calcium  8.8 (L) 8.9 - 10.3 mg/dL   GFR, Estimated >39 >39 mL/min    Comment: (NOTE) Calculated using the CKD-EPI Creatinine Equation (2021)    Anion gap 9 5 - 15    Comment: Performed at [facility], [address], [place], [place] [address]  Troponin T, High Sensitivity     Status: None   Collection Time: 12/19/23  3:02 AM  Result Value Ref Range   Troponin T High Sensitivity <15 0 - 19 ng/L    Comment: (NOTE) Biotin concentrations > 1000 ng/mL falsely decrease TnT results.  Serial cardiac troponin measurements are suggested.  Refer  to the Links section for chest pain algorithms and additional  guidance. Performed at [facility], [address] [place], [place] [address]     DG Chest 1 View Result Date: 12/18/2023 CLINICAL DATA:  Chest pain EXAM: CHEST  1 VIEW COMPARISON:  November 29, 2022 FINDINGS: The heart size and mediastinal contours are within normal limits. Left-sided pacemaker is unchanged. Both lungs are clear. The visualized skeletal structures are unremarkable. IMPRESSION: No active disease. Electronically Signed   By: Lynwood Landy Raddle M.D.   On: 12/18/2023 13:03   CUP PACEART INCLINIC DEVICE CHECK Result Date: 12/18/2023 Patient brought into device clinic d/t previous device alert w/ patient c/o SOB/Fatigue. Device interrogation and programming changes performed w/ [facility] Representative, [place]. Presenting Rhythm  AFL/VS 91 irreg. R-R. Normal device function. Patient given ED precautions d/t rhythm and c/o SOB. Patient to visit ED directly after OV. Previous device alert believed to be AP/VS/VP w/ FF present on RA lead causing mode switch to DDI resulting in low PR interval which could contribute to current symptoms. 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 On Xarelto  per EPIC. Current AT/AF burden 2.6%, believed to be higher d/t undersensing. With programming changes will be able to see more accurate AT/AF burden. Given patient symptoms Dr. Cindie recommended ED precautions. Patient verbalized understanding and agreeable w/ plan. Programming Changes per [facility] Rep: - Atrial Max Sensitivity reprogrammed from 0.102mV to 0.69mV d/t undersensing AFL/AF waves. - Channel 1: Stored EGM Configuration reprogrammed from Atrial Sense Amp to Atrial Bipolar.Rozelle Banter, BSN, RN   Review of Systems we discussed her cardiology issue as well she does want to eat Blood pressure (!) 101/52, pulse (!) 55, temperature 97.7 F (36.5 C), temperature source Oral, resp. rate 17, SpO2 100%. 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  Plan: Will order iron studies tomorrow and if she does not need a transfusion consider IV iron and consider repeat CT just to be sure but will proceed with an endoscopy first on Friday and my partner did say she has a difficult colon to negotiate based on fixation and diverticuli and if endoscopy not revealing we will need that either as an inpatient or outpatient and I answered all of her and her husband's questions but will allow to eat for now and will check on tomorrow and please call me sooner if specific GI question or problem arise  Angeleah Labrake E 12/19/2023, 10:53 AM         [1]  Allergies Allergen Reactions    Ibuprofen Other (See Comments)    Pt is on Xarelto    Nsaids Other (See Comments)    Pt on Xarelto    Tolmetin Other (See Comments)    Pt on Xarelto    Zoster Vaccine Live Other (See Comments)    Serum sickness, high fever, neck pain    Tape Other (See Comments)    Causes blisters    Amoxicillin Rash   Lactose Intolerance (Gi) Diarrhea and Nausea And Vomiting   Lisinopril Cough   Penicillins Rash    Local reaction

## 2023-12-19 NOTE — Consult Note (Signed)
 t   ELECTROPHYSIOLOGY CONSULT NOTE    Patient ID: Veronica Bradford MRN: [id number], DOB/AGE: 76/03/1945 76 y.o.  Admit date: 12/18/2023 Date of Consult: 12/19/2023  Primary Physician: Amon Aloysius BRAVO, MD Primary Cardiologist: None  Electrophysiologist: Dr. Kennyth   Referring Provider: Dr. Lue  Patient Profile: Veronica Bradford is a 78 y.o. female with a history of atrial fibrillation, atrial flutter, PVCs, tachycardia/bradycardia syndrome s/p PPM who is being seen today for the evaluation of atrial flutter at the request of Dr. Lue.  HPI:  Veronica Bradford is a 78 y.o. female with above noted medical history who was seen in device clinic yesterday and referred to the ED.   Patient's arrhythmia history is complex. In 2019, she underwent ablation for afib. By 2023, it was noted that patient was having self limited episodes of afib, approximately every 4-6 weeks and she was started on Dofetilide . Initially this worked well to limit these episodes, but by March of this year, noted more breakthrough afib. She was ultimately referred to Dr. Kennyth and underwent redo ablation (found to have reconnection of the LSPV, LIPV, RSPV, CTI remained blocked) on 08/01/23. Following redo ablation, patient with atrial bigeminy and frequent PACs, symptoms with both. She was taken off Dofetilide  and started on Flecainide .   On 12/11 patient called after hours provider line to report symptoms of afib. She also submitted a remote transmission from her device. This transmission was not suggestive of atrial fib or flutter at the time of her symptoms. It did however reveal FF on RA lead and patient was scheduled in device clinic for programming adjustments. In clinic on 12/16, patient noted to be in atrial flutter (though undersensed at time of interrogation) since 12/15. Programming Changes per [facility] Rep:- Atrial Max Sensitivity reprogrammed from 0.1mV to 0.87mV d/t undersensing AFL/AF waves. - Channel 1: Stored EGM  Configuration reprogrammed from Atrial Sense Amp to Atrial Bipolar. In this visit, patient revealed acute on chronic fatigue and dyspnea, prompting recommendation that she present to the ED.   Today in the ED, patient states that for about 3 weeks, she has had significantly less energy than her normal baseline, is exertionally limited with dyspnea. Initially attributed these symptoms to arrhythmia, though labs in the ED reveal acute anemia with positive HOBT. She has not seen any obvious evidence of GI bleeding at home.   Labs Potassium3.8 (12/17 0302) Magnesium   2.0 (12/16 1217) Creatinine, ser  0.59 (12/17 0302) PLT  316 (12/17 0302) HGB  8.3* (12/17 0302) WBC 7.8 (12/17 0302)  .    Past Medical History:  Diagnosis Date   Atrial fibrillation (HCC)    Atrial fibrillation (HCC)    Back pain    CHF (congestive heart failure) (HCC)    grade 2 HF   Chronic sinusitis    s/p 2 surgeries remotely, Dr Ethyl   Closed fracture of metatarsal of left foot    L foot, fifth metatarsal   Complication of anesthesia    woke up once during procedure   Dysrhythmia    a-fib   Ectopic pregnancy    Endometrial cancer (HCC) 2004   Early diagnosis   GERD (gastroesophageal reflux disease)    Hyperlipemia    Hypertension    IBS (irritable bowel syndrome) 08/13/2014   Dx 2015, diarrhea on-off, Dr Donnald    Lumbar radiculopathy 08/2012   MRI in 08/2012   Mild hyperparathyroidism    Osteopenia    PAF (paroxysmal atrial fibrillation) (HCC)  Presence of permanent cardiac pacemaker    Sciatica of right side 08/2013   Received Depomedrol 80 mg injection   Sleep apnea 09/09/2013   Dx with OSA in 2015, by Dr. Fernande,   wears CPAPAPAP     Surgical History:  Past Surgical History:  Procedure Laterality Date   ATRIAL FIBRILLATION ABLATION N/A 09/04/2017   Procedure: ATRIAL FIBRILLATION ABLATION;  Surgeon: Kelsie Agent, MD;  Location: [facility] INVASIVE CV LAB;  Service: Cardiovascular;  Laterality: N/A;    ATRIAL FIBRILLATION ABLATION N/A 08/01/2023   Procedure: ATRIAL FIBRILLATION ABLATION;  Surgeon: Kennyth Chew, MD;  Location: [facility] INVASIVE CV LAB;  Service: Cardiovascular;  Laterality: N/A;   BIOPSY  03/11/2020   Procedure: BIOPSY;  Surgeon: Donnald Charleston, MD;  Location: [facility] ENDOSCOPY;  Service: Endoscopy;;  EGD and COLON   CARPAL TUNNEL RELEASE Right 06/17/2019   CARPAL TUNNEL RELEASE Left 11/2020   CHOLECYSTECTOMY  ~2006   COLONOSCOPY WITH PROPOFOL  N/A 03/11/2020   Procedure: COLONOSCOPY WITH PROPOFOL ;  Surgeon: Donnald Charleston, MD;  Location: [facility]OS[facility];  Service: Endoscopy;  Laterality: N/A;   DILATION AND CURETTAGE OF UTERUS     after SAB   ESOPHAGOGASTRODUODENOSCOPY  02/09/2014   Dr. Donnald   ESOPHAGOGASTRODUODENOSCOPY (EGD) WITH PROPOFOL  N/A 03/11/2020   Procedure: ESOPHAGOGASTRODUODENOSCOPY (EGD) WITH PROPOFOL ;  Surgeon: Donnald Charleston, MD;  Location: [facility] ENDOS[facility];  Service: Endoscopy;  Laterality: N/A;   FINGER SURGERY Left    index   NASAL SINUS SURGERY     x 2 remotely, Dr Ethyl   PACEMAKER IMPLANT N/A 09/13/2020   Procedure: PACEMAKER IMPLANT;  Surgeon: Fernande Elspeth BROCKS, MD;  Location: [facility] INVASIVE CV LAB;  Service: Cardiovascular;  Laterality: N/A;   PARATHYROIDECTOMY Left 09/14/2022   Procedure: LEFT PARATHYROIDECTOMY;  Surgeon: Eletha Boas, MD;  Location: [facility];  Service: General;  Laterality: Left;   TONSILLECTOMY     TOTAL ABDOMINAL HYSTERECTOMY W/ BILATERAL SALPINGOOPHORECTOMY     TOTAL HIP ARTHROPLASTY Left 07/18/2021   Procedure: TOTAL HIP ARTHROPLASTY ANTERIOR APPROACH;  Surgeon: Fidel Rogue, MD;  Location: [facility] OR[facility];  Service: Orthopedics;  Laterality: Left;   TOTAL KNEE ARTHROPLASTY Right ~2008     (Not in a hospital admission)   Inpatient Medications:   colestipol   2 g Oral BID   Ferrous Fumarate   1 tablet Oral Daily   flecainide   75 mg Oral BID   metoprolol  succinate  50 mg Oral Daily   pantoprazole   40 mg Oral BID   pravastatin   40 mg Oral  QPM    Allergies: Allergies[1]  Family History  Problem Relation Age of Onset   Hypertension Mother    Colon cancer Mother    Breast cancer Mother    Ovarian cancer Mother    Hypertension Father    Diabetes Father    Head & neck cancer Sister    CAD Neg Hx      Physical Exam: Vitals:   12/19/23 0406 12/19/23 0500 12/19/23 0800 12/19/23 0808  BP: (!) 94/33 (!) 99/55 (!) 101/52   Pulse: 83 80 (!) 55   Resp: 17 20 17    Temp: 97.6 F (36.4 C)   97.7 F (36.5 C)  TempSrc: Oral   Oral  SpO2: 100% 91% 100%     GEN- NAD, A&O x 3, normal affect HEENT: Normocephalic, atraumatic Lungs- CTAB, Normal effort.  Heart- Irregularly irregular rate and rhythm, No M/G/R.  GI- Soft, NT, ND.  Extremities- No clubbing, cyanosis, or edema   Radiology/Studies: DG Chest 1 View  Result Date: 12/18/2023 CLINICAL DATA:  Chest pain EXAM: CHEST  1 VIEW COMPARISON:  November 29, 2022 FINDINGS: The heart size and mediastinal contours are within normal limits. Left-sided pacemaker is unchanged. Both lungs are clear. The visualized skeletal structures are unremarkable. IMPRESSION: No active disease. Electronically Signed   By: Lynwood Landy Raddle M.D.   On: 12/18/2023 13:03   CUP PACEART INCLINIC DEVICE CHECK Result Date: 12/18/2023 Patient brought into device clinic d/t previous device alert w/ patient c/o SOB/Fatigue. Device interrogation and programming changes performed w/ [facility] Representative, [place]. Presenting Rhythm AFL/VS 91 irreg. R-R. Normal device function. Patient given ED precautions d/t rhythm and c/o SOB. Patient to visit ED directly after OV. Previous device alert believed to be AP/VS/VP w/ FF present on RA lead causing mode switch to DDI resulting in low PR interval which could contribute to current symptoms. 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 On Xarelto  per EPIC. Current AT/AF burden 2.6%, believed to be higher d/t  undersensing. With programming changes will be able to see more accurate AT/AF burden. Given patient symptoms Dr. Cindie recommended ED precautions. Patient verbalized understanding and agreeable w/ plan. Programming Changes per [facility] Rep: - Atrial Max Sensitivity reprogrammed from 0.41mV to 0.82mV d/t undersensing AFL/AF waves. - Channel 1: Stored EGM Configuration reprogrammed from Atrial Sense Amp to Atrial Bipolar.Rozelle Banter, BSN, RN  CUP PACEART REMOTE DEVICE CHECK Result Date: 12/12/2023 Pacemaker: Scheduled remote reviewed. Normal device function.  Presenting rhythm: AS/VS Known PAF, AF burden 1%, Xarelto  per [facility] EMR Next remote transmission per protocol. ML, CVRS  MM 3D DIAGNOSTIC MAMMOGRAM UNILATERAL RIGHT BREAST Result Date: 12/06/2023 CLINICAL DATA:  78 year old presenting with paresthesias involving the outer periareolar RIGHT breast. Family history of breast cancer in her mother in her 34s. EXAM: DIGITAL DIAGNOSTIC UNILATERAL RIGHT MAMMOGRAM WITH TOMOSYNTHESIS AND CAD; ULTRASOUND RIGHT BREAST LIMITED TECHNIQUE: Right digital diagnostic mammography and breast tomosynthesis was performed. The images were evaluated with computer-aided detection. ; Targeted ultrasound examination of the right breast was performed COMPARISON:  Previous exam(s). ACR Breast Density Category b: There are scattered areas of fibroglandular density. FINDINGS: Full field CC and MLO views and a spot tangential view of the area of concern were obtained. No findings suspicious for malignancy. No mammographic abnormality in the outer periareolar location in the area of paresthesia. Targeted ultrasound is performed in the area of concern, demonstrating normal scattered fibroglandular tissue at 10 o'clock 3 cm from the nipple. No cyst, solid mass or abnormal acoustic shadowing is identified. On correlative physical examination, there is no palpable abnormality in the outer periareolar location. There is no skin erythema.  IMPRESSION: No mammographic or sonographic evidence of malignancy involving the RIGHT breast. RECOMMENDATION: Annual BILATERAL screening mammography which is due in March, 2026. I have discussed the findings and recommendations with the patient. If applicable, a reminder letter will be sent to the patient regarding the next appointment. BI-RADS CATEGORY  1: Negative. Electronically Signed   By: Debby Satterfield M.D.   On: 12/06/2023 13:53   US  LIMITED ULTRASOUND INCLUDING AXILLA RIGHT BREAST Result Date: 12/06/2023 CLINICAL DATA:  78 year old presenting with paresthesias involving the outer periareolar RIGHT breast. Family history of breast cancer in her mother in her 41s. EXAM: DIGITAL DIAGNOSTIC UNILATERAL RIGHT MAMMOGRAM WITH TOMOSYNTHESIS AND CAD; ULTRASOUND RIGHT BREAST LIMITED TECHNIQUE: Right digital diagnostic mammography and breast tomosynthesis was performed. The images were evaluated with computer-aided detection. ; Targeted ultrasound examination of  the right breast was performed COMPARISON:  Previous exam(s). ACR Breast Density Category b: There are scattered areas of fibroglandular density. FINDINGS: Full field CC and MLO views and a spot tangential view of the area of concern were obtained. No findings suspicious for malignancy. No mammographic abnormality in the outer periareolar location in the area of paresthesia. Targeted ultrasound is performed in the area of concern, demonstrating normal scattered fibroglandular tissue at 10 o'clock 3 cm from the nipple. No cyst, solid mass or abnormal acoustic shadowing is identified. On correlative physical examination, there is no palpable abnormality in the outer periareolar location. There is no skin erythema. IMPRESSION: No mammographic or sonographic evidence of malignancy involving the RIGHT breast. RECOMMENDATION: Annual BILATERAL screening mammography which is due in March, 2026. I have discussed the findings and recommendations with the patient. If  applicable, a reminder letter will be sent to the patient regarding the next appointment. BI-RADS CATEGORY  1: Negative. Electronically Signed   By: Debby Satterfield M.D.   On: 12/06/2023 13:53    EKG: Atypical atrial flutter with variable conduction, intermittent V pacing (personally reviewed)  TELEMETRY: persistent atypical atrial flutter with intermittent V pacing, rates well controlled (personally reviewed)  DEVICE HISTORY:  [employer] Assurity MRI Dual Chamber PPM implanted 09/2020 for Sinus Node Dysfunction    Assessment/Plan:  Paroxysmal atrial fibrillation Persistent atrial flutter Secondary hypercoagulable state Patient with 2 prior ablations (2019, 2025) noted with recurrent atrial flutter on 12/15, though possibly with earlier onset not detected by her device. She initially attributed her fatigue and dyspnea over the last several weeks to arrhythmia, now has been found with acute anemia in the setting of GIB. Given that these symptoms were also occurring earlier this month when she was in a sinus rhythm, suspect that anemia is the primary cause. Remains in flutter today, ventricular rates are well controlled. With ongoing workup of GIB, her Xarelto  will be held, precluding any attempts to restore NSR. At this time, would continue Flecainide  and Metoprolol . We will arrange close outpatient follow up in afib clinic to discuss rhythm management once cleared to resume OAC. In the longterm, will need to consider LAA closure given GIB.   Tachycardia bradycardia syndrome As above, device reprogrammed yesterday to increase atrial sensing.  Acute anemia GI bleed HGB down further this AM: 8.6->8.3. She is pending inpatient GI evaluation.  Hyperlipidemia Continue statin.     For questions or updates, please contact [facility] Please consult [email or web address] for contact info under     Signed, Artist Pouch, PA-C  12/19/2023, 8:19 AM           [1]  Allergies Allergen  Reactions   Ibuprofen Other (See Comments)    Pt is on Xarelto    Nsaids Other (See Comments)    Pt on Xarelto    Tolmetin Other (See Comments)    Pt on Xarelto    Zoster Vaccine Live Other (See Comments)    Serum sickness, high fever, neck pain    Tape Other (See Comments)    Causes blisters    Amoxicillin Rash   Lactose Intolerance (Gi) Diarrhea and Nausea And Vomiting   Lisinopril Cough   Penicillins Rash    Local reaction

## 2023-12-19 NOTE — ED Notes (Signed)
Walked patient to the bathroom patient did well 

## 2023-12-19 NOTE — ED Notes (Signed)
 Handoff report given to [facility] RN

## 2023-12-19 NOTE — ED Notes (Signed)
 CCMD called to confirm pt transfer from room 21 to room 5.

## 2023-12-19 NOTE — Progress Notes (Signed)
 PROGRESS NOTE    Veronica Bradford  FMW:[id number] DOB: 1945-10-10 DOA: 12/18/2023 PCP: Amon Aloysius BRAVO, MD   Brief Narrative:  DAY ZERO -for complete HPI please see H&P this morning by Dr. Vernon  Briefly patient presents with generalized weakness shortness of breath palpitations and chest pain found to have acute symptomatic anemia with concern for GI bleed given FOBT positive status.  GI has been consulted for further evaluation and treatment.  Xarelto  on hold  Patient also has history of a flutter status post pacemaker currently on Xarelto , on hold as above, as well as chronic diastolic congestive heart failure, hyperlipidemia, GERD, hypertension, obesity, sleep apnea compliant with CPAP.  Initial workup thus far appears to be negative for ACS given negative troponin and unremarkable EKGs.  Cardiology/EP is following along we do appreciate their insight recommendations  Assessment & Plan:   Principal Problem:   Acute blood loss anemia  Acute symptomatic anemia, rule out GI bleed -[facility] following, appreciate insight recommendations -Baseline hemoglobin around 14 currently 8 -FOBT positive, Xarelto  on hold  Weakness/ambulatory dysfunction - Likely secondary to above - Continue to follow clinically, PT OT to follow along  Atypical chest pain, ACS ruled out  Aflutter w/o RVR Hypertension Heart failure, diastolic, not in acute exacerbation -Rate currently appears controlled, EP/cardiology following -recently transition to flecainide  and metoprolol  from Tikosyn  -Troponin negative, EKG without ST elevations or depressions -Could consider supply/demand mismatch with symptomatic tachycardia in the setting of profound anemia -symptoms have improved drastically since admission with supportive care  Hyperlipidemia: Pravastatin . Sleep apnea, continue CPAP  DVT prophylaxis: SCDs Start: 12/19/23 0033 chemical prophylaxis on hold given above Code Status:   Code Status: Full Code Family  Communication: None present  Status is: Observation  Dispo: The patient is from: Home              Anticipated d/c is to: Home              Anticipated d/c date is: 24 to 48 hours              Patient currently not medically stable for discharge  Consultants:  GI, cardiology/EP  Procedures:  Pending above  Antimicrobials:  None currently indicated  Subjective: No acute issues or events overnight denies nausea vomiting diarrhea constipation headache fevers chills or chest pain  Objective: Vitals:   12/19/23 0033 12/19/23 0036 12/19/23 0406 12/19/23 0500  BP:  123/67 (!) 94/33 (!) 99/55  Pulse:  86 83 80  Resp:  20 17 20   Temp: 97.8 F (36.6 C)  97.6 F (36.4 C)   TempSrc: Oral  Oral   SpO2:  100% 100% 91%   No intake or output data in the 24 hours ending 12/19/23 0720 There were no vitals filed for this visit.  Examination:  General:  Pleasantly resting in bed, No acute distress. HEENT:  Normocephalic atraumatic.  Sclerae nonicteric, noninjected.  Extraocular movements intact bilaterally. Neck:  Without mass or deformity.  Trachea is midline. Lungs:  Clear to auscultate bilaterally without rhonchi, wheeze, or rales. Heart:  Regular rate and rhythm.  Without murmurs, rubs, or gallops. Abdomen:  Soft, nontender, nondistended.  Without guarding or rebound. Extremities: Without cyanosis, clubbing, edema, or obvious deformity. Skin:  Warm and dry, no erythema.  Data Reviewed: I have personally reviewed following labs and imaging studies  CBC: Recent Labs  Lab 12/18/23 1217 12/19/23 0302  WBC 5.6 7.8  NEUTROABS 3.9  --   HGB 8.6* 8.3*  HCT 28.2* 27.4*  MCV 88.1 87.5  PLT 311 316   Basic Metabolic Panel: Recent Labs  Lab 12/18/23 1217 12/19/23 0302  NA 139 141  K 4.1 3.8  CL 107 108  CO2 21* 24  GLUCOSE 82 94  BUN 13 14  CREATININE 0.61 0.59  CALCIUM  8.9 8.8*  MG 2.0  --    GFR: CrCl cannot be calculated (Unknown ideal weight.). Liver Function  Tests: Recent Labs  Lab 12/18/23 1217  AST 23  ALT 13  ALKPHOS 57  BILITOT 0.3  PROT 5.7*  ALBUMIN 3.7   No results found for this or any previous visit (from the past 240 hours).   Radiology Studies: DG Chest 1 View Result Date: 12/18/2023 CLINICAL DATA:  Chest pain EXAM: CHEST  1 VIEW COMPARISON:  November 29, 2022 FINDINGS: The heart size and mediastinal contours are within normal limits. Left-sided pacemaker is unchanged. Both lungs are clear. The visualized skeletal structures are unremarkable. IMPRESSION: No active disease. Electronically Signed   By: Lynwood Landy Raddle M.D.   On: 12/18/2023 13:03   CUP PACEART INCLINIC DEVICE CHECK Result Date: 12/18/2023 Patient brought into device clinic d/t previous device alert w/ patient c/o SOB/Fatigue. Device interrogation and programming changes performed w/ [facility] Representative, [place]. Presenting Rhythm AFL/VS 91 irreg. R-R. Normal device function. Patient given ED precautions d/t rhythm and c/o SOB. Patient to visit ED directly after OV. Previous device alert believed to be AP/VS/VP w/ FF present on RA lead causing mode switch to DDI resulting in low PR interval which could contribute to current symptoms. 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 On Xarelto  per EPIC. Current AT/AF burden 2.6%, believed to be higher d/t undersensing. With programming changes will be able to see more accurate AT/AF burden. Given patient symptoms Dr. Cindie recommended ED precautions. Patient verbalized understanding and agreeable w/ plan. Programming Changes per [facility] Rep: - Atrial Max Sensitivity reprogrammed from 0.26mV to 0.61mV d/t undersensing AFL/AF waves. - Channel 1: Stored EGM Configuration reprogrammed from Atrial Sense Amp to Atrial Bipolar.Rozelle Banter, BSN, RN  Scheduled Meds:  colestipol   2 g Oral BID   Ferrous Fumarate   1 tablet Oral Daily   flecainide   75 mg Oral BID   metoprolol   succinate  50 mg Oral Daily   pantoprazole   40 mg Oral BID   pravastatin   40 mg Oral QPM   Continuous Infusions:  sodium chloride  100 mL/hr at 12/19/23 0301     LOS: 0 days   Time spent:  Elsie JAYSON [place], DO [facility]  If 7PM-7AM, please contact night-coverage [email or web address]  12/19/2023, 7:20 AM

## 2023-12-19 NOTE — ED Provider Notes (Signed)
 [facility]-EM[facility]ENCY DEPT [facility] Emergency Department Provider Note MRN:  [id number]  Arrival date & time: 12/19/2023     Chief Complaint   Shortness of Breath, aterial flutter, and Chest Pain   History of Present Illness   Veronica Bradford is a 78 y.o. year-old female presents to the ED with chief complaint of shortness of breath.  Has had gradually worsening shortness of breath for the past month or so.  States that she has also been having some trouble with her heart rhythms and is concerned that she has been in a flutter.  She is anticoagulated on Xarelto .  She states that she was seen earlier today by cardiology and was referred to the emergency department for shortness of breath and further evaluation.  History provided by patient.   Review of Systems  Pertinent positive and negative review of systems noted in HPI.    Physical Exam   Vitals:   12/18/23 1938 12/19/23 0033  BP: (!) 127/55   Pulse: (!) 130   Resp: (!) 21   Temp: (!) 97.4 F (36.3 C) 97.8 F (36.6 C)  SpO2: 95%     CONSTITUTIONAL:  non toxic-appearing, NAD NEURO:  Alert and oriented x 3, CN 3-12 grossly intact EYES:  eyes equal and reactive ENT/NECK:  Supple, no stridor  CARDIO:  tachycardic, regular rhythm, appears well-perfused  PULM:  No respiratory distress, CTAB GI/GU:  non-distended, RN Melanie, chaperone present for exam, dark stool on DRE MSK/SPINE:  No gross deformities, no edema, moves all extremities  SKIN:  no rash, atraumatic   *Additional and/or pertinent findings included in MDM below  Diagnostic and Interventional Summary    EKG Interpretation Date/Time:  Tuesday December 18 2023 11:25:51 EST Ventricular Rate:  71 PR Interval:    QRS Duration:  162 QT Interval:  440 QTC Calculation: 478 R Axis:   -12  Text Interpretation: Undetermined rhythm Right bundle branch block Septal infarct , age undetermined Compared with prior EKG from 12/03/2023 Confirmed by Gennaro Bouchard ([id number])  on 12/19/2023 12:37:00 AM       Labs Reviewed  CBC WITH DIFFERENTIAL/PLATELET - Abnormal; Notable for the following components:      Result Value   RBC 3.20 (*)    Hemoglobin 8.6 (*)    HCT 28.2 (*)    RDW 16.2 (*)    All other components within normal limits  COMPREHENSIVE METABOLIC PANEL WITH GFR - Abnormal; Notable for the following components:   CO2 21 (*)    Total Protein 5.7 (*)    All other components within normal limits  POC OCCULT BLOOD, ED - Abnormal; Notable for the following components:   Fecal Occult Blood, POC Positive (*)    All other components within normal limits  MAGNESIUM   CBC  BASIC METABOLIC PANEL WITH GFR  TYPE AND SCREEN  TROPONIN T, HIGH SENSITIVITY    DG Chest 1 View  Final Result      Medications  acetaminophen  (TYLENOL ) tablet 500 mg (has no administration in time range)  colestipol  (COLESTID ) tablet 2 g (has no administration in time range)  flecainide  (TAMBOCOR ) tablet 75 mg (has no administration in time range)  metoprolol  succinate (TOPROL -XL) 24 hr tablet 50 mg (has no administration in time range)  pravastatin  (PRAVACHOL ) tablet 40 mg (has no administration in time range)  pantoprazole  (PROTONIX ) EC tablet 40 mg (has no administration in time range)  sucralfate  (CARAFATE ) tablet 1 g (has no administration in time range)  ferrous fumarate  (HEMOCYTE -  106 mg FE) tablet 106 mg of iron (has no administration in time range)  0.9 %  sodium chloride  infusion (has no administration in time range)  ondansetron  (ZOFRAN ) tablet 4 mg (has no administration in time range)    Or  ondansetron  (ZOFRAN ) injection 4 mg (has no administration in time range)     Procedures  /  Critical Care .Critical Care  Performed by: Vicky Charleston, PA-C Authorized by: Vicky Charleston, PA-C   Critical care provider statement:    Critical care time (minutes):  35   Critical care was necessary to treat or prevent imminent or life-threatening deterioration of the  following conditions:  Circulatory failure   Critical care was time spent personally by me on the following activities:  Development of treatment plan with patient or surrogate, discussions with consultants, evaluation of patient's response to treatment, examination of patient, ordering and review of laboratory studies, ordering and review of radiographic studies, ordering and performing treatments and interventions, pulse oximetry, re-evaluation of patient's condition and review of old charts   ED Course and Medical Decision Making  I have reviewed the triage vital signs, the nursing notes, and pertinent available records from the EMR.  Social Determinants Affecting Complexity of Care: Patient has no clinically significant social determinants affecting this chief complaint..   ED Course:    Medical Decision Making Patient here with worsening shortness of breath over the past month or so.  She was sent in by outpatient clinic for further evaluation of palpitations.  She has had troubles with her heart rhythm recently and has had changes in rate controlling medication from Tikosyn  to metoprolol  and flecainide , and then decreasing flecainide  due to prolonged PR interval.  Laboratory workup today is notable for a new anemia of 8.6, down from 13.2 in July of this year.  Hemoccult is positive.  She is anticoagulated on Xarelto .  Suspect GI loss.  Will need admission.  Will consult with medicine and will send secure chat to GI.  Patient sees Dr. Rochele.  Cardiology note in epic says that cards will round on patient when admitted.    Amount and/or Complexity of Data Reviewed Labs: ordered.  Risk Decision regarding hospitalization.         Consultants: I consulted with Hospitalist, Dr. Vernon, who is appreciated for admitting. Secure chat sent to [facility] GI, Dr. Elicia, requesting AM consult.  Treatment and Plan: Patient's exam and diagnostic results are concerning for  symptomatic anemia and palpitations.  Feel that patient will need admission to the hospital for further treatment and evaluation.    Final Clinical Impressions(s) / ED Diagnoses     ICD-10-CM   1. Symptomatic anemia  D64.9     2. Gastrointestinal hemorrhage, unspecified gastrointestinal hemorrhage type  K92.2     3. SOB (shortness of breath)  R06.02     4. Palpitations  R00.2       ED Discharge Orders     None         Discharge Instructions Discussed with and Provided to Patient:   Discharge Instructions   None      Vicky Charleston, PA-C 12/19/23 0037    Kammerer, Megan L, DO 12/20/23 0300

## 2023-12-20 ENCOUNTER — Ambulatory Visit: Payer: Self-pay | Admitting: Cardiology

## 2023-12-20 DIAGNOSIS — R002 Palpitations: Secondary | ICD-10-CM | POA: Diagnosis present

## 2023-12-20 DIAGNOSIS — K648 Other hemorrhoids: Secondary | ICD-10-CM | POA: Diagnosis present

## 2023-12-20 DIAGNOSIS — I495 Sick sinus syndrome: Secondary | ICD-10-CM | POA: Diagnosis present

## 2023-12-20 DIAGNOSIS — K449 Diaphragmatic hernia without obstruction or gangrene: Secondary | ICD-10-CM | POA: Diagnosis not present

## 2023-12-20 DIAGNOSIS — Z96642 Presence of left artificial hip joint: Secondary | ICD-10-CM | POA: Diagnosis present

## 2023-12-20 DIAGNOSIS — D649 Anemia, unspecified: Secondary | ICD-10-CM | POA: Diagnosis not present

## 2023-12-20 DIAGNOSIS — I4892 Unspecified atrial flutter: Secondary | ICD-10-CM | POA: Diagnosis present

## 2023-12-20 DIAGNOSIS — K5731 Diverticulosis of large intestine without perforation or abscess with bleeding: Secondary | ICD-10-CM | POA: Diagnosis present

## 2023-12-20 DIAGNOSIS — I10 Essential (primary) hypertension: Secondary | ICD-10-CM | POA: Diagnosis not present

## 2023-12-20 DIAGNOSIS — E860 Dehydration: Secondary | ICD-10-CM | POA: Diagnosis present

## 2023-12-20 DIAGNOSIS — Z833 Family history of diabetes mellitus: Secondary | ICD-10-CM | POA: Diagnosis not present

## 2023-12-20 DIAGNOSIS — E785 Hyperlipidemia, unspecified: Secondary | ICD-10-CM | POA: Diagnosis present

## 2023-12-20 DIAGNOSIS — Z87891 Personal history of nicotine dependence: Secondary | ICD-10-CM | POA: Diagnosis not present

## 2023-12-20 DIAGNOSIS — Z7901 Long term (current) use of anticoagulants: Secondary | ICD-10-CM | POA: Diagnosis not present

## 2023-12-20 DIAGNOSIS — Z96651 Presence of right artificial knee joint: Secondary | ICD-10-CM | POA: Diagnosis present

## 2023-12-20 DIAGNOSIS — K219 Gastro-esophageal reflux disease without esophagitis: Secondary | ICD-10-CM | POA: Diagnosis present

## 2023-12-20 DIAGNOSIS — I4719 Other supraventricular tachycardia: Secondary | ICD-10-CM | POA: Diagnosis present

## 2023-12-20 DIAGNOSIS — K644 Residual hemorrhoidal skin tags: Secondary | ICD-10-CM | POA: Diagnosis present

## 2023-12-20 DIAGNOSIS — Z950 Presence of cardiac pacemaker: Secondary | ICD-10-CM | POA: Diagnosis not present

## 2023-12-20 DIAGNOSIS — Z8249 Family history of ischemic heart disease and other diseases of the circulatory system: Secondary | ICD-10-CM | POA: Diagnosis not present

## 2023-12-20 DIAGNOSIS — I5032 Chronic diastolic (congestive) heart failure: Secondary | ICD-10-CM

## 2023-12-20 DIAGNOSIS — I4819 Other persistent atrial fibrillation: Secondary | ICD-10-CM | POA: Diagnosis present

## 2023-12-20 DIAGNOSIS — K5730 Diverticulosis of large intestine without perforation or abscess without bleeding: Secondary | ICD-10-CM | POA: Diagnosis not present

## 2023-12-20 DIAGNOSIS — D62 Acute posthemorrhagic anemia: Secondary | ICD-10-CM | POA: Diagnosis present

## 2023-12-20 DIAGNOSIS — E669 Obesity, unspecified: Secondary | ICD-10-CM | POA: Diagnosis present

## 2023-12-20 DIAGNOSIS — G4733 Obstructive sleep apnea (adult) (pediatric): Secondary | ICD-10-CM | POA: Diagnosis present

## 2023-12-20 DIAGNOSIS — D6869 Other thrombophilia: Secondary | ICD-10-CM | POA: Diagnosis present

## 2023-12-20 DIAGNOSIS — Z7985 Long-term (current) use of injectable non-insulin antidiabetic drugs: Secondary | ICD-10-CM | POA: Diagnosis not present

## 2023-12-20 DIAGNOSIS — I110 Hypertensive heart disease with heart failure: Secondary | ICD-10-CM | POA: Diagnosis present

## 2023-12-20 LAB — FERRITIN: Ferritin: 17 ng/mL (ref 11–307)

## 2023-12-20 LAB — CBC WITH DIFFERENTIAL/PLATELET
Abs Immature Granulocytes: 0.02 K/uL (ref 0.00–0.07)
Basophils Absolute: 0 K/uL (ref 0.0–0.1)
Basophils Relative: 1 %
Eosinophils Absolute: 0.1 K/uL (ref 0.0–0.5)
Eosinophils Relative: 2 %
HCT: 23.8 % — ABNORMAL LOW (ref 36.0–46.0)
Hemoglobin: 7.5 g/dL — ABNORMAL LOW (ref 12.0–15.0)
Immature Granulocytes: 0 %
Lymphocytes Relative: 23 %
Lymphs Abs: 1.5 K/uL (ref 0.7–4.0)
MCH: 27.4 pg (ref 26.0–34.0)
MCHC: 31.5 g/dL (ref 30.0–36.0)
MCV: 86.9 fL (ref 80.0–100.0)
Monocytes Absolute: 0.9 K/uL (ref 0.1–1.0)
Monocytes Relative: 14 %
Neutro Abs: 3.8 K/uL (ref 1.7–7.7)
Neutrophils Relative %: 60 %
Platelets: 261 K/uL (ref 150–400)
RBC: 2.74 MIL/uL — ABNORMAL LOW (ref 3.87–5.11)
RDW: 16.8 % — ABNORMAL HIGH (ref 11.5–15.5)
WBC: 6.4 K/uL (ref 4.0–10.5)
nRBC: 0 % (ref 0.0–0.2)

## 2023-12-20 LAB — BASIC METABOLIC PANEL WITH GFR
Anion gap: 8 (ref 5–15)
BUN: 13 mg/dL (ref 8–23)
CO2: 21 mmol/L — ABNORMAL LOW (ref 22–32)
Calcium: 8.2 mg/dL — ABNORMAL LOW (ref 8.9–10.3)
Chloride: 110 mmol/L (ref 98–111)
Creatinine, Ser: 0.67 mg/dL (ref 0.44–1.00)
GFR, Estimated: >60 mL/min (ref >60)
Glucose, Bld: 88 mg/dL (ref 70–99)
Potassium: 3.8 mmol/L (ref 3.5–5.1)
Sodium: 139 mmol/L (ref 135–145)

## 2023-12-20 LAB — IRON AND TIBC
Iron: 44 ug/dL (ref 28–170)
Saturation Ratios: 14 % (ref 10.4–31.8)
TIBC: 312 ug/dL (ref 250–450)
UIBC: 269 ug/dL

## 2023-12-20 LAB — PREPARE RBC (CROSSMATCH)

## 2023-12-20 MED ORDER — SODIUM CHLORIDE 0.9 % IV SOLN: INTRAVENOUS | Status: DC

## 2023-12-20 MED ORDER — ACETAMINOPHEN 325 MG PO TABS
650.0000 mg | ORAL_TABLET | Freq: Once | ORAL | Status: AC
  Administered 2023-12-20: 12:00:00 650 mg via ORAL
  Filled 2023-12-20: qty 2

## 2023-12-20 MED ORDER — SODIUM CHLORIDE 0.9% IV SOLUTION: Freq: Once | INTRAVENOUS | Status: AC

## 2023-12-20 MED ORDER — BENEFIBER PO CHEW: 2.0000 | CHEWABLE_TABLET | Freq: Every evening | ORAL | Status: DC

## 2023-12-20 MED ORDER — BENEFIBER PO POWD: 1.0000 | Freq: Every morning | ORAL | Status: DC

## 2023-12-20 MED ORDER — NUTRISOURCE FIBER PO PACK
1.0000 | PACK | Freq: Two times a day (BID) | ORAL | Status: DC
  Administered 2023-12-21 – 2023-12-23 (×4): 1
  Filled 2023-12-20 (×7): qty 1

## 2023-12-20 NOTE — Progress Notes (Signed)
 Pravastatin  due time changed to 2000 because patient states she must wait two hours after taking Carafate  to take any further medications.

## 2023-12-20 NOTE — TOC Initial Note (Signed)
 Transition of Care [facility]) - Initial/Assessment Note    Patient Details  Name: Veronica Bradford MRN: [id number] Date of Birth: January 16, 1945  [facility] CM/SW Contact:    Sudie Erminio Deems, RN Phone Number: 12/20/2023, 1:16 PM  Clinical Narrative: Patient presented for shortness of breath. PTA patient was from home with spouse. Patient has insurance and PCP. No home needs identified during this visit. ICM will continue to follow for disposition needs.                   Expected Discharge Plan: Home/Self Care Barriers to Discharge: Continued Medical Work up   Patient Goals and CMS Choice Patient states their goals for this hospitalization and ongoing recovery are:: Plan to return home          Expected Discharge Plan and Services In-house Referral: NA Discharge Planning Services: CM Consult Post Acute Care Choice: NA Living arrangements for the past 2 months: Single Family Home                           HH Arranged: NA          Prior Living Arrangements/Services Living arrangements for the past 2 months: Single Family Home Lives with:: Spouse Patient language and need for interpreter reviewed:: Yes Do you feel safe going back to the place where you live?: Yes      Need for Family Participation in Patient Care: No (Comment) Care giver support system in place?: No (comment)   Criminal Activity/Legal Involvement Pertinent to Current Situation/Hospitalization: No - Comment as needed  Activities of Daily Living   ADL Screening (condition at time of admission) Independently performs ADLs?: Yes (appropriate for developmental age) Is the patient deaf or have difficulty hearing?: Yes Does the patient have difficulty seeing, even when wearing glasses/contacts?: No Does the patient have difficulty concentrating, remembering, or making decisions?: No  Permission Sought/Granted Permission sought to share information with : Family Supports, Case  Manager                Emotional Assessment Appearance:: Appears stated age Attitude/Demeanor/Rapport: Engaged Affect (typically observed): Appropriate Orientation: : Oriented to Self, Oriented to Place, Oriented to  Time, Oriented to Situation Alcohol  / Substance Use: Not Applicable Psych Involvement: No (comment)  Admission diagnosis:  Palpitations [R00.2] Acute blood loss anemia [D62] SOB (shortness of breath) [R06.02] Symptomatic anemia [D64.9] Gastrointestinal hemorrhage, unspecified gastrointestinal hemorrhage type [K92.2] Patient Active Problem List   Diagnosis Date Noted   Acute blood loss anemia 12/19/2023   Hyperparathyroidism, primary 09/03/2022   DOE (dyspnea on exertion) 05/04/2022   Paroxysmal A-fib (HCC) 01/03/2022   Chronic diastolic CHF (congestive heart failure) (HCC) 07/14/2021   Pacemaker 12/14/2020   RBBB 08/09/2020   PVC's (premature ventricular contractions) 08/09/2020   Sinus bradycardia - junctional rhythm 08/09/2020   Vitamin D  deficiency 01/20/2019   Multiple pulmonary nodules determined by computed tomography of lung 12/06/2017   Osteopenia 05/23/2017   Annual physical exam 04/14/2015   PCP NOTES >>>>> 12/23/2014   IBS (irritable bowel syndrome) 08/13/2014   Hyperglycemia    Chronic sinusitis    Endometrial cancer (HCC)    Back pain-- on tylenol  #3 prn    GERD (gastroesophageal reflux disease)    HTN (hypertension) 09/09/2013   Diastolic dysfunction-grade 2 09/09/2013   Chronic anticoagulation 09/09/2013   Edema of both legs 09/09/2013   Dyslipidemia 09/09/2013   Morbid obesity (HCC) 09/09/2013   OSA on  CPAP 09/09/2013   PCP:  Amon Aloysius BRAVO, MD Pharmacy:   [facility] [phone] - [place], [place] - [address] AT [facility] [address] [place] [address] Phone: [phone] Fax: [phone]     Social Drivers of Health (SDOH) Social History: SDOH Screenings   Food Insecurity: No Food Insecurity (12/19/2023)   Housing: Low Risk (12/19/2023)  Transportation Needs: No Transportation Needs (12/19/2023)  Utilities: Not At Risk (12/19/2023)  Alcohol  Screen: Low Risk (07/23/2023)  Depression (PHQ2-9): Low Risk (10/31/2023)  Financial Resource Strain: Low Risk (07/23/2023)  Physical Activity: Insufficiently Active (07/23/2023)  Social Connections: Moderately Isolated (12/19/2023)  Stress: No Stress Concern Present (07/23/2023)  Tobacco Use: Medium Risk (12/19/2023)  Health Literacy: Adequate Health Literacy (06/27/2023)   SDOH Interventions:     Readmission Risk Interventions    12/01/2022    1:35 PM  Readmission Risk Prevention Plan  Transportation Screening Complete  PCP or Specialist Appt within 5-7 Days Complete  Home Care Screening Complete  Medication Review (RN CM) Complete

## 2023-12-20 NOTE — Progress Notes (Signed)
°  Rates remain stable.   Note plans from GI. Hgb continues to drop.   No plans for rhythm control at this time with holding OAC and planning for EGD +/- eventual colon.  Flecainide  stopped for now with risk of conversion and increased risk of stroke off OAC.   EP is available with any questions or concerns.   Ozell Jodie Passey, PA-C  12/20/2023 7:16 AM

## 2023-12-20 NOTE — Progress Notes (Signed)
 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

## 2023-12-20 NOTE — Anesthesia Preprocedure Evaluation (Signed)
 Anesthesia Evaluation    Reviewed: Allergy & Precautions, H&P , Patient's Chart, lab work & pertinent test results  Airway        Dental   Pulmonary neg pulmonary ROS, sleep apnea , former smoker          Cardiovascular Exercise Tolerance: Good hypertension, Pt. on medications and Pt. on home beta blockers +CHF and + DOE  negative cardio ROS + dysrhythmias Atrial Fibrillation + pacemaker      Neuro/Psych negative neurological ROS  negative psych ROS   GI/Hepatic negative GI ROS, Neg liver ROS,GERD  Medicated,,  Endo/Other  negative endocrine ROS    Renal/GU negative Renal ROS  negative genitourinary   Musculoskeletal   Abdominal   Peds  Hematology negative hematology ROS (+) Blood dyscrasia, anemia   Anesthesia Other Findings   Reproductive/Obstetrics negative OB ROS                              Anesthesia Physical Anesthesia Plan  ASA: 3  Anesthesia Plan: MAC   Post-op Pain Management: Minimal or no pain anticipated   Induction: Intravenous  PONV Risk Score and Plan: 2 and Propofol  infusion  Airway Management Planned: Natural Airway and Simple Face Mask  Additional Equipment:   Intra-op Plan:   Post-operative Plan:   Informed Consent: I have reviewed the patients History and Physical, chart, labs and discussed the procedure including the risks, benefits and alternatives for the proposed anesthesia with the patient or authorized representative who has indicated his/her understanding and acceptance.       Plan Discussed with:   Anesthesia Plan Comments:         Anesthesia Quick Evaluation

## 2023-12-20 NOTE — Progress Notes (Signed)
 PROGRESS NOTE    Veronica Bradford  FMW:[id number] DOB: 23-Jun-1945 DOA: 12/18/2023 PCP: Amon Aloysius BRAVO, MD    Chief Complaint  Patient presents with   Shortness of Breath   aterial flutter   Chest Pain    Brief Narrative:  Briefly patient presents with generalized weakness shortness of breath palpitations and chest pain found to have acute symptomatic anemia with concern for GI bleed given FOBT positive status.  GI has been consulted for further evaluation and treatment.  Xarelto  on hold   Patient also has history of a flutter status post pacemaker currently on Xarelto , on hold as above, as well as chronic diastolic congestive heart failure, hyperlipidemia, GERD, hypertension, obesity, sleep apnea compliant with CPAP.   Initial workup thus far appears to be negative for ACS given negative troponin and unremarkable EKGs.  Cardiology/EP is following along we do appreciate their insight recommendations   Assessment & Plan:   Principal Problem:   Acute blood loss anemia Active Problems:   HTN (hypertension)   Chronic anticoagulation   Dyslipidemia   OSA on CPAP   GERD (gastroesophageal reflux disease)   Chronic diastolic CHF (congestive heart failure) (HCC)   Atrial flutter (HCC)  #1 symptomatic anemia/rule out GI bleed -Patient noted to have presented with generalized weakness, shortness of breath on minimal exertion, palpitations, chest pain noted to have a drop in hemoglobin down to 8.6 with last hemoglobin on epic noted at 13.2 on 07/25/2023. - Patient denies any overt bleeding and states he is on oral iron supplementation - Hemoglobin noted at 7.5 this morning. - Will transfuse 2 units PRBCs. - Xarelto  on hold. - Patient seen in consultation by GI and patient for upper endoscopy tomorrow and probable colonoscopy and further evaluation. - Appreciate GI input and recommendations.  2.  Weakness/ambulatory dysfunction -Likely secondary to problem #1. - PT/OT.  3.  Atypical  chest pain, ACS ruled out/atrial flutter without RVR/hypertension/chronic diastolic heart failure -Rate currently controlled. - Patient seen in consultation by EP/cardiology and patient noted to have recently been transition to flecainide  and metoprolol  from Tikosyn . - Flecainide  discontinued by EP and patient holding Xarelto . -Continue Toprol -XL for rate control. - Per EP pending GI workup.  4.  Hyperlipidemia -Continue statin.  5.  Sleep apnea -CPAP nightly.  6.  GERD -PPI.   DVT prophylaxis: SCDs Code Status: Full Family Communication: Updated patient.  No family at bedside. Disposition: TBD  Status is: Inpatient The patient will require care spanning > 2 midnights and should be moved to inpatient because: Severity of illness   Consultants:  EP: Dr.Carlisle 12/19/2023 GI: Dr. Rosalie 12/19/2023  Procedures:  Chest x-ray 12/18/2023 Transfused 2 units PRBCs pending 12/20/2023  Antimicrobials:  Anti-infectives (From admission, onward)    None         Subjective: Patient sitting up at the side of the bed currently receiving transfusion of PRBCs.  Patient denies any chest pain.  Does endorse shortness of breath on minimal exertion.  Stated felt a little dizzy this morning.  Patient also with some complaints of feeling fuzzy in her brain.  Objective: Vitals:   12/20/23 0933 12/20/23 1204 12/20/23 1225 12/20/23 1600  BP: (!) 113/53  (!) 119/53 (!) 109/59  Pulse: 70 63 87 81  Resp:  15 16 20   Temp:  98.6 F (37 C) 98.1 F (36.7 C) 98.2 F (36.8 C)  TempSrc:  Oral Oral Oral  SpO2:   100% 97%  Weight:      Height:  Intake/Output Summary (Last 24 hours) at 12/20/2023 1804 Last data filed at 12/20/2023 1737 Gross per 24 hour  Intake 360 ml  Output --  Net 360 ml   Filed Weights   12/19/23 1500  Weight: 102.8 kg    Examination:  General exam: Appears calm and comfortable.  Pallor. Respiratory system: Clear to auscultation. Respiratory effort  normal. Cardiovascular system: Irregularly irregular.  No JVD, no murmurs rubs or gallops.  No pitting lower extremity edema.  Gastrointestinal system: Abdomen is nondistended, soft and nontender. No organomegaly or masses felt. Normal bowel sounds heard. Central nervous system: Alert and oriented. No focal neurological deficits. Extremities: Symmetric 5 x 5 power. Skin: No rashes, lesions or ulcers Psychiatry: Judgement and insight appear normal. Mood & affect appropriate.     Data Reviewed: I have personally reviewed following labs and imaging studies  CBC: Recent Labs  Lab 12/18/23 1217 12/19/23 0302 12/20/23 0426  WBC 5.6 7.8 6.4  NEUTROABS 3.9  --  3.8  HGB 8.6* 8.3* 7.5*  HCT 28.2* 27.4* 23.8*  MCV 88.1 87.5 86.9  PLT 311 316 261    Basic Metabolic Panel: Recent Labs  Lab 12/18/23 1217 12/19/23 0302 12/20/23 0426  NA 139 141 139  K 4.1 3.8 3.8  CL 107 108 110  CO2 21* 24 21*  GLUCOSE 82 94 88  BUN 13 14 13   CREATININE 0.61 0.59 0.67  CALCIUM  8.9 8.8* 8.2*  MG 2.0  --   --     GFR: Estimated Creatinine Clearance: 74.4 mL/min (by C-G formula based on SCr of 0.67 mg/dL).  Liver Function Tests: Recent Labs  Lab 12/18/23 1217  AST 23  ALT 13  ALKPHOS 57  BILITOT 0.3  PROT 5.7*  ALBUMIN 3.7    CBG: No results for input(s): GLUCAP in the last 168 hours.   No results found for this or any previous visit (from the past 240 hours).       Radiology Studies: No results found.      Scheduled Meds:  colestipol   2 g Oral BID   Ferrous Fumarate   1 tablet Oral Daily   metoprolol  succinate  50 mg Oral Daily   pantoprazole   40 mg Oral BID   pravastatin   40 mg Oral QPM   Continuous Infusions:  sodium chloride        LOS: 0 days    Time spent: 40 minutes    Toribio Hummer, MD [facility]   To contact the attending provider between 7A-7P or the covering provider during after hours 7P-7A, please log into the web site  [email or web address] and access using universal [place] password for that web site. If you do not have the password, please call the hospital operator.  12/20/2023, 6:04 PM

## 2023-12-20 NOTE — Care Management Obs Status (Signed)
 MEDICARE OBSERVATION STATUS NOTIFICATION   Patient Details  Name: Veronica Bradford MRN: [id number] Date of Birth: 04/29/1945   Medicare Observation Status Notification Given:  Yes    Sudie Erminio Deems, RN 12/20/2023, 12:27 PM

## 2023-12-21 ENCOUNTER — Encounter (HOSPITAL_COMMUNITY): Payer: Self-pay | Admitting: Family Medicine

## 2023-12-21 ENCOUNTER — Encounter (HOSPITAL_COMMUNITY): Payer: Self-pay | Admitting: Anesthesiology

## 2023-12-21 ENCOUNTER — Encounter (HOSPITAL_COMMUNITY): Admission: EM | Disposition: A | Payer: Self-pay | Source: Ambulatory Visit | Attending: Orthopedic Surgery

## 2023-12-21 ENCOUNTER — Inpatient Hospital Stay (HOSPITAL_COMMUNITY): Payer: Self-pay | Admitting: Anesthesiology

## 2023-12-21 DIAGNOSIS — D649 Anemia, unspecified: Secondary | ICD-10-CM | POA: Diagnosis not present

## 2023-12-21 DIAGNOSIS — K449 Diaphragmatic hernia without obstruction or gangrene: Secondary | ICD-10-CM | POA: Diagnosis not present

## 2023-12-21 DIAGNOSIS — I10 Essential (primary) hypertension: Secondary | ICD-10-CM | POA: Diagnosis not present

## 2023-12-21 DIAGNOSIS — D62 Acute posthemorrhagic anemia: Secondary | ICD-10-CM | POA: Diagnosis not present

## 2023-12-21 DIAGNOSIS — I5032 Chronic diastolic (congestive) heart failure: Secondary | ICD-10-CM | POA: Diagnosis not present

## 2023-12-21 DIAGNOSIS — Z87891 Personal history of nicotine dependence: Secondary | ICD-10-CM | POA: Diagnosis not present

## 2023-12-21 DIAGNOSIS — I110 Hypertensive heart disease with heart failure: Secondary | ICD-10-CM

## 2023-12-21 HISTORY — PX: ESOPHAGOGASTRODUODENOSCOPY: SHX5428

## 2023-12-21 LAB — CBC
HCT: 32.2 % — ABNORMAL LOW (ref 36.0–46.0)
Hemoglobin: 10.6 g/dL — ABNORMAL LOW (ref 12.0–15.0)
MCH: 28.3 pg (ref 26.0–34.0)
MCHC: 32.9 g/dL (ref 30.0–36.0)
MCV: 85.9 fL (ref 80.0–100.0)
Platelets: 282 K/uL (ref 150–400)
RBC: 3.75 MIL/uL — ABNORMAL LOW (ref 3.87–5.11)
RDW: 17 % — ABNORMAL HIGH (ref 11.5–15.5)
WBC: 5.9 K/uL (ref 4.0–10.5)
nRBC: 0 % (ref 0.0–0.2)

## 2023-12-21 LAB — BPAM RBC
Blood Product Expiration Date: 202512212359
Blood Product Expiration Date: 202601142359
ISSUE DATE / TIME: 202512181153
ISSUE DATE / TIME: 202512181812
Unit Type and Rh: 600
Unit Type and Rh: 6200

## 2023-12-21 LAB — TYPE AND SCREEN
ABO/RH(D): A POS
Antibody Screen: NEGATIVE
Unit division: 0
Unit division: 0

## 2023-12-21 LAB — BASIC METABOLIC PANEL WITH GFR
Anion gap: 10 (ref 5–15)
BUN: 10 mg/dL (ref 8–23)
CO2: 21 mmol/L — ABNORMAL LOW (ref 22–32)
Calcium: 9.3 mg/dL (ref 8.9–10.3)
Chloride: 111 mmol/L (ref 98–111)
Creatinine, Ser: 0.61 mg/dL (ref 0.44–1.00)
GFR, Estimated: >60 mL/min
Glucose, Bld: 87 mg/dL (ref 70–99)
Potassium: 3.9 mmol/L (ref 3.5–5.1)
Sodium: 142 mmol/L (ref 135–145)

## 2023-12-21 LAB — MAGNESIUM: Magnesium: 2 mg/dL (ref 1.7–2.4)

## 2023-12-21 MED ORDER — LIDOCAINE 2% (20 MG/ML) 5 ML SYRINGE
INTRAMUSCULAR | Status: DC | PRN
  Administered 2023-12-21: 80 mg via INTRAVENOUS

## 2023-12-21 MED ORDER — ONDANSETRON HCL 4 MG/2ML IJ SOLN
INTRAMUSCULAR | Status: DC | PRN
  Administered 2023-12-21: 4 mg via INTRAVENOUS

## 2023-12-21 MED ORDER — PROPOFOL 500 MG/50ML IV EMUL
INTRAVENOUS | Status: DC | PRN
  Administered 2023-12-21: 125 ug/kg/min via INTRAVENOUS

## 2023-12-21 MED ORDER — PROPOFOL 10 MG/ML IV BOLUS
INTRAVENOUS | Status: DC | PRN
  Administered 2023-12-21: 30 mg via INTRAVENOUS

## 2023-12-21 MED ORDER — SODIUM CHLORIDE 0.9 % IV SOLN: INTRAVENOUS | Status: DC

## 2023-12-21 MED ORDER — PEG 3350-KCL-NA BICARB-NACL 420 G PO SOLR
4000.0000 mL | Freq: Once | ORAL | Status: AC
  Administered 2023-12-21: 4000 mL via ORAL
  Filled 2023-12-21: qty 4000

## 2023-12-21 MED ORDER — SODIUM CHLORIDE 0.9 % IV SOLN
INTRAVENOUS | Status: AC | PRN
  Administered 2023-12-21: 500 mL via INTRAVENOUS

## 2023-12-21 NOTE — Progress Notes (Signed)
" °  Note EGD without significant findings and plans for potential colonoscopy.   Will defer OAC resumption timing to GI.   Pt with EP follow up 12/30 with Dr. Kennyth. Will discuss options for rhythm control at that time.   Would leave off flecainide  at discharge with potential for chemical conversion off OAC.   EP will see as needed while remains here.    Ozell Jodie Passey, PA-C  12/21/2023 2:16 PM  "

## 2023-12-21 NOTE — Progress Notes (Signed)
 " PROGRESS NOTE    Veronica Bradford  FMW:[id number] DOB: January 04, 1945 DOA: 12/18/2023 PCP: Amon Aloysius BRAVO, MD    Chief Complaint  Patient presents with   Shortness of Breath   aterial flutter   Chest Pain    Brief Narrative:  Briefly patient presents with generalized weakness shortness of breath palpitations and chest pain found to have acute symptomatic anemia with concern for GI bleed given FOBT positive status.  GI has been consulted for further evaluation and treatment.  Xarelto  on hold   Patient also has history of a flutter status post pacemaker currently on Xarelto , on hold as above, as well as chronic diastolic congestive heart failure, hyperlipidemia, GERD, hypertension, obesity, sleep apnea compliant with CPAP.   Initial workup thus far appears to be negative for ACS given negative troponin and unremarkable EKGs.  Cardiology/EP is following along we do appreciate their insight recommendations   Assessment & Plan:   Principal Problem:   Acute blood loss anemia Active Problems:   HTN (hypertension)   Chronic anticoagulation   Dyslipidemia   OSA on CPAP   GERD (gastroesophageal reflux disease)   Chronic diastolic CHF (congestive heart failure) (HCC)   Atrial flutter (HCC)   Symptomatic anemia  #1 symptomatic anemia/rule out GI bleed -Patient noted to have presented with generalized weakness, shortness of breath on minimal exertion, palpitations, chest pain noted to have a drop in hemoglobin down to 8.6 with last hemoglobin on epic noted at 13.2 on 07/25/2023. - Patient denies any overt bleeding and states he is on oral iron supplementation - Patient status post transfusion 2 units PRBCs, 12/20/2023, with posttransfusion hemoglobin noted at 10.6 from 7.5 from 8.3 on admission.  -Patient with clinical improvement. - Xarelto  on hold. - Patient seen in consultation by GI and patient status post upper endoscopy this morning, 12/20/2023, which showed a normal larynx, small hiatal  hernia, gastritis, normal duodenal bulb, first portion of the duodenum, second portion of the duodenum and third portion of the duodenum.  No specimens collected.  - GI recommending colonoscopy which will be done tomorrow.  - Follow H&H.  - Transfusion threshold hemoglobin < 8. - GI following and appreciate input and recommendations.   2.  Weakness/ambulatory dysfunction -Likely secondary to problem #1. -Improved posttransfusion. - PT/OT.  3.  Atypical chest pain, ACS ruled out/atrial flutter without RVR/hypertension/chronic diastolic heart failure -Rate currently controlled. - Patient seen in consultation by EP/cardiology and patient noted to have recently been transition to flecainide  and metoprolol  from Tikosyn . - Flecainide  discontinued by EP and patient holding Xarelto . -Continue Toprol -XL for rate control. - Per EP pending GI workup.  4.  Hyperlipidemia - Statin.   5.  Sleep apnea -CPAP nightly.  6.  GERD - Continue PPI.   DVT prophylaxis: SCDs Code Status: Full Family Communication: Updated patient.  No family at bedside. Disposition: TBD  Status is: Inpatient The patient will require care spanning > 2 midnights and should be moved to inpatient because: Severity of illness   Consultants:  EP: Dr.Carlisle 12/19/2023 GI: Dr. Rosalie 12/19/2023  Procedures:  Chest x-ray 12/18/2023 Transfused 2 units PRBCs  12/20/2023 EGD: Dr. Rosalie 12/21/2023   Antimicrobials:  Anti-infectives (From admission, onward)    None         Subjective: Patient sitting up at the side of the bed.  Noted to have had a upper endoscopy this morning and states spoke with GI and patient for colonoscopy tomorrow.  Patient states after transfusion of PRBCs has  felt significantly better, no further dizziness, shortness of breath on minimal exertion has improved significantly per patient.  Patient denies any overt bleeding.    Objective: Vitals:   12/21/23 0836 12/21/23 0838 12/21/23 0840  12/21/23 0901  BP: (!) 95/54 (!) 97/57 (!) 97/57 (!) 99/45  Pulse: 68 70 68 61  Resp: (!) 21 16 17    Temp:    97.8 F (36.6 C)  TempSrc:    Oral  SpO2: 94% 96% 97% 98%  Weight:      Height:        Intake/Output Summary (Last 24 hours) at 12/21/2023 1251 Last data filed at 12/21/2023 0753 Gross per 24 hour  Intake 1657.12 ml  Output --  Net 1657.12 ml   Filed Weights   12/19/23 1500  Weight: 102.8 kg    Examination:  General exam: NAD. Respiratory system: Lungs clear to auscultation bilaterally.  No wheezes, no crackles, no rhonchi.  Fair air movement.  Speaking in full sentences.   Cardiovascular system: Irregularly irregular.  No JVD.  No murmurs rubs or gallops.  No lower extremity edema.  Gastrointestinal system: Abdomen is soft, nontender, nondistended, positive bowel sounds.  No rebound.  No guarding. Central nervous system: Alert and oriented. No focal neurological deficits. Extremities: Symmetric 5 x 5 power. Skin: No rashes, lesions or ulcers Psychiatry: Judgement and insight appear normal. Mood & affect appropriate.     Data Reviewed: I have personally reviewed following labs and imaging studies  CBC: Recent Labs  Lab 12/18/23 1217 12/19/23 0302 12/20/23 0426 12/21/23 0548  WBC 5.6 7.8 6.4 5.9  NEUTROABS 3.9  --  3.8  --   HGB 8.6* 8.3* 7.5* 10.6*  HCT 28.2* 27.4* 23.8* 32.2*  MCV 88.1 87.5 86.9 85.9  PLT 311 316 261 282    Basic Metabolic Panel: Recent Labs  Lab 12/18/23 1217 12/19/23 0302 12/20/23 0426 12/21/23 0548  NA 139 141 139 142  K 4.1 3.8 3.8 3.9  CL 107 108 110 111  CO2 21* 24 21* 21*  GLUCOSE 82 94 88 87  BUN 13 14 13 10   CREATININE 0.61 0.59 0.67 0.61  CALCIUM  8.9 8.8* 8.2* 9.3  MG 2.0  --   --  2.0    GFR: Estimated Creatinine Clearance: 74.4 mL/min (by C-G formula based on SCr of 0.61 mg/dL).  Liver Function Tests: Recent Labs  Lab 12/18/23 1217  AST 23  ALT 13  ALKPHOS 57  BILITOT 0.3  PROT 5.7*  ALBUMIN 3.7     CBG: No results for input(s): GLUCAP in the last 168 hours.   No results found for this or any previous visit (from the past 240 hours).       Radiology Studies: No results found.      Scheduled Meds:  colestipol   2 g Oral BID   Ferrous Fumarate   1 tablet Oral Daily   fiber  1 packet Per Tube BID   metoprolol  succinate  50 mg Oral Daily   pantoprazole   40 mg Oral BID   polyethylene glycol-electrolytes  4,000 mL Oral Once   pravastatin   40 mg Oral QPM   Continuous Infusions:     LOS: 1 day    Time spent: 40 minutes    Toribio Hummer, MD [facility]   To contact the attending provider between 7A-7P or the covering provider during after hours 7P-7A, please log into the web site [email or web address] and access using universal On[employer] password for that web site. If  you do not have the password, please call the hospital operator.  12/21/2023, 12:51 PM    "

## 2023-12-21 NOTE — Op Note (Signed)
 [facility] Patient Name: Veronica Bradford Procedure Date : 12/21/2023 MRN: [id number] Attending MD: Oliva Boots , MD, [id number] Date of Birth: 03-Jul-1945 CSN: [id number] Age: 78 Admit Type: Inpatient Procedure:                Upper GI endoscopy Indications:              Acute post hemorrhagic anemia, Heme positive stool Providers:                Oliva Boots, MD, Gregoria Pierce, RN, Corene Southgate,                            Technician Referring MD:              Medicines:                Monitored Anesthesia Care Complications:            No immediate complications. Estimated Blood Loss:     Estimated blood loss: none. Procedure:                Pre-Anesthesia Assessment:                           - Prior to the procedure, a History and Physical                            was performed, and patient medications and                            allergies were reviewed. The patient's tolerance of                            previous anesthesia was also reviewed. The risks                            and benefits of the procedure and the sedation                            options and risks were discussed with the patient.                            All questions were answered, and informed consent                            was obtained. Prior Anticoagulants: The patient has                            taken Xarelto  (rivaroxaban ), last dose was 2 days                            prior to procedure. ASA Grade Assessment: III - A                            patient with severe systemic disease. After  reviewing the risks and benefits, the patient was                            deemed in satisfactory condition to undergo the                            procedure.                           After obtaining informed consent, the endoscope was                            passed under direct vision. Throughout the                            procedure, the patient's  blood pressure, pulse, and                            oxygen saturations were monitored continuously. The                            [id number]-H190 ([id number]) Olympus endoscope was introduced                            through the mouth, and advanced to the third part                            of duodenum. The upper GI endoscopy was                            accomplished without difficulty. The patient                            tolerated the procedure well. Scope In: Scope Out: Findings:      The larynx was normal.      A small hiatal hernia was present.      Localized moderate inflammation characterized by erythema was found in       the gastric antrum.      The duodenal bulb, first portion of the duodenum, second portion of the       duodenum and third portion of the duodenum were normal.      The cardia and gastric fundus were normal on retroflexion. And no signs       of GI bleeding Impression:               - Normal larynx.                           - Small hiatal hernia.                           - Gastritis.                           - Normal duodenal bulb, first portion of the  duodenum, second portion of the duodenum and third                            portion of the duodenum.                           - No specimens collected. Recommendation:           - Will discuss colonoscopy when awake to decide                            diet. But we can proceed with colonoscopy tomorrow                            or let her go home and see how she does and                            reevaluate in the office                           - Continue present medications.                           - Return to GI clinic in 4 weeks.                           - Telephone GI clinic if symptomatic PRN. Procedure Code(s):        --- Professional ---                           [phone], Esophagogastroduodenoscopy, flexible,                            transoral; diagnostic,  including collection of                            specimen(s) by brushing or washing, when performed                            (separate procedure) Diagnosis Code(s):        --- Professional ---                           K44.9, Diaphragmatic hernia without obstruction or                            gangrene                           K29.70, Gastritis, unspecified, without bleeding                           D62, Acute posthemorrhagic anemia                           R19.5, Other fecal abnormalities CPT copyright 2022 [employer]. All rights reserved. The codes  documented in this report are preliminary and upon coder review may  be revised to meet current compliance requirements. Oliva Boots, MD 12/21/2023 8:13:41 AM This report has been signed electronically. Number of Addenda: 0

## 2023-12-21 NOTE — Plan of Care (Signed)
  Problem: Clinical Measurements: Goal: Will remain free from infection Outcome: Progressing Goal: Respiratory complications will improve Outcome: Progressing Goal: Cardiovascular complication will be avoided Outcome: Progressing   Problem: Activity: Goal: Risk for activity intolerance will decrease Outcome: Progressing   Problem: Nutrition: Goal: Adequate nutrition will be maintained Outcome: Progressing   Problem: Safety: Goal: Ability to remain free from injury will improve Outcome: Progressing

## 2023-12-21 NOTE — Plan of Care (Signed)

## 2023-12-21 NOTE — Progress Notes (Signed)
 Veronica Bradford 7:43 AM  Subjective: Patient still with reflux and indigestion having trouble getting Carafate  when she needs it which we discussed no new complaints and we rediscussed the procedure  Objective: Vital signs stable afebrile no acute distress exam please see preassessment evaluation chemistries okay hemoglobin increased nicely with transfusion  Assessment: Guaiac positive anemia and patient on Eliquis   Plan: Okay to proceed with endoscopy with anesthesia assistance  [facility]  office [phone] After 5PM or if no answer call [phone]

## 2023-12-21 NOTE — Transfer of Care (Signed)
 Immediate Anesthesia Transfer of Care Note  Patient: Veronica Bradford  Procedure(s) Performed: EGD (ESOPHAGOGASTRODUODENOSCOPY)  Patient Location: Endoscopy Unit  Anesthesia Type:MAC  Level of Consciousness: awake, alert , and drowsy  Airway & Oxygen Therapy: Patient Spontanous Breathing and Patient connected to face mask oxygen  Post-op Assessment: Report given to RN, Post -op Vital signs reviewed and stable, and Patient moving all extremities X 4  Post vital signs: Reviewed and stable  Last Vitals:  Vitals Value Taken Time  BP 97/57 12/21/23 08:38  Temp 35.8 C 12/21/23 08:30  Pulse 63 12/21/23 08:38  Resp 25 12/21/23 08:38  SpO2 98 % 12/21/23 08:38  Vitals shown include unfiled device data.  Last Pain:  Vitals:   12/21/23 0836  TempSrc:   PainSc: 0-No pain      Patients Stated Pain Goal: 0 (12/20/23 2027)  Complications: No notable events documented.

## 2023-12-21 NOTE — Anesthesia Preprocedure Evaluation (Signed)
"                                    Anesthesia Evaluation  Patient identified by MRN, date of birth, ID band Patient awake    Reviewed: Allergy & Precautions, H&P , NPO status , Patient's Chart, lab work & pertinent test results, reviewed documented beta blocker date and time   History of Anesthesia Complications (+) history of anesthetic complications  Airway Mallampati: II  TM Distance: >3 FB Neck ROM: Full    Dental no notable dental hx. (+) Teeth Intact, Dental Advisory Given   Pulmonary sleep apnea and Continuous Positive Airway Pressure Ventilation , neg COPD, former smoker   Pulmonary exam normal breath sounds clear to auscultation       Cardiovascular Exercise Tolerance: Good hypertension, Pt. on medications and Pt. on home beta blockers + Past MI, +CHF and + DOE  (-) CAD and (-) Cardiac Stents + dysrhythmias Atrial Fibrillation + pacemaker  Rhythm:Regular Rate:Normal     Neuro/Psych neg Seizures  Neuromuscular disease negative neurological ROS  negative psych ROS   GI/Hepatic Neg liver ROS,GERD  Medicated,,(+) neg Cirrhosis        Endo/Other  negative endocrine ROS    Renal/GU Renal diseasenegative Renal ROS  negative genitourinary   Musculoskeletal   Abdominal   Peds  Hematology  (+) Blood dyscrasia, anemia   Anesthesia Other Findings   Reproductive/Obstetrics negative OB ROS                              Anesthesia Physical Anesthesia Plan  ASA: 3  Anesthesia Plan: MAC   Post-op Pain Management: Minimal or no pain anticipated   Induction: Intravenous  PONV Risk Score and Plan: 2 and Propofol  infusion  Airway Management Planned: Natural Airway and Simple Face Mask  Additional Equipment:   Intra-op Plan:   Post-operative Plan:   Informed Consent: I have reviewed the patients History and Physical, chart, labs and discussed the procedure including the risks, benefits and alternatives for the  proposed anesthesia with the patient or authorized representative who has indicated his/her understanding and acceptance.     Dental advisory given  Plan Discussed with: CRNA  Anesthesia Plan Comments:          Anesthesia Quick Evaluation  "

## 2023-12-21 NOTE — Anesthesia Postprocedure Evaluation (Signed)
"   Anesthesia Post Note  Patient: Silvano [name]  Procedure(s) Performed: EGD (ESOPHAGOGASTRODUODENOSCOPY)     Patient location during evaluation: Endoscopy Anesthesia Type: MAC Level of consciousness: awake and alert Pain management: pain level controlled Vital Signs Assessment: post-procedure vital signs reviewed and stable Respiratory status: spontaneous breathing, nonlabored ventilation and respiratory function stable Cardiovascular status: stable and blood pressure returned to baseline Postop Assessment: no apparent nausea or vomiting Anesthetic complications: no   No notable events documented.  Last Vitals:  Vitals:   12/21/23 0840 12/21/23 0901  BP: (!) 97/57 (!) 99/45  Pulse: 68 61  Resp: 17   Temp:  36.6 C  SpO2: 97% 98%    Last Pain:  Vitals:   12/21/23 0901  TempSrc: Oral  PainSc:                  Nolia Tschantz,W. EDMOND      "

## 2023-12-22 ENCOUNTER — Inpatient Hospital Stay (HOSPITAL_COMMUNITY): Admitting: Anesthesiology

## 2023-12-22 ENCOUNTER — Encounter (HOSPITAL_COMMUNITY): Admission: EM | Disposition: A | Payer: Self-pay | Source: Ambulatory Visit | Attending: Orthopedic Surgery

## 2023-12-22 ENCOUNTER — Encounter (HOSPITAL_COMMUNITY): Payer: Self-pay | Admitting: Family Medicine

## 2023-12-22 DIAGNOSIS — Z87891 Personal history of nicotine dependence: Secondary | ICD-10-CM | POA: Diagnosis not present

## 2023-12-22 DIAGNOSIS — I10 Essential (primary) hypertension: Secondary | ICD-10-CM | POA: Diagnosis not present

## 2023-12-22 DIAGNOSIS — K5730 Diverticulosis of large intestine without perforation or abscess without bleeding: Secondary | ICD-10-CM | POA: Diagnosis not present

## 2023-12-22 DIAGNOSIS — I110 Hypertensive heart disease with heart failure: Secondary | ICD-10-CM

## 2023-12-22 DIAGNOSIS — I5032 Chronic diastolic (congestive) heart failure: Secondary | ICD-10-CM

## 2023-12-22 DIAGNOSIS — D649 Anemia, unspecified: Secondary | ICD-10-CM | POA: Diagnosis not present

## 2023-12-22 DIAGNOSIS — D62 Acute posthemorrhagic anemia: Secondary | ICD-10-CM | POA: Diagnosis not present

## 2023-12-22 HISTORY — PX: COLONOSCOPY: SHX5424

## 2023-12-22 LAB — BASIC METABOLIC PANEL WITH GFR
Anion gap: 9 (ref 5–15)
BUN: 7 mg/dL — ABNORMAL LOW (ref 8–23)
CO2: 23 mmol/L (ref 22–32)
Calcium: 8.7 mg/dL — ABNORMAL LOW (ref 8.9–10.3)
Chloride: 110 mmol/L (ref 98–111)
Creatinine, Ser: 0.62 mg/dL (ref 0.44–1.00)
GFR, Estimated: >60 mL/min
Glucose, Bld: 79 mg/dL (ref 70–99)
Potassium: 3.7 mmol/L (ref 3.5–5.1)
Sodium: 142 mmol/L (ref 135–145)

## 2023-12-22 LAB — CBC
HCT: 31.1 % — ABNORMAL LOW (ref 36.0–46.0)
Hemoglobin: 9.8 g/dL — ABNORMAL LOW (ref 12.0–15.0)
MCH: 27.2 pg (ref 26.0–34.0)
MCHC: 31.5 g/dL (ref 30.0–36.0)
MCV: 86.4 fL (ref 80.0–100.0)
Platelets: 276 K/uL (ref 150–400)
RBC: 3.6 MIL/uL — ABNORMAL LOW (ref 3.87–5.11)
RDW: 17 % — ABNORMAL HIGH (ref 11.5–15.5)
WBC: 5.7 K/uL (ref 4.0–10.5)
nRBC: 0 % (ref 0.0–0.2)

## 2023-12-22 LAB — MAGNESIUM: Magnesium: 1.9 mg/dL (ref 1.7–2.4)

## 2023-12-22 MED ORDER — PHENYLEPHRINE 80 MCG/ML (10ML) SYRINGE FOR IV PUSH (FOR BLOOD PRESSURE SUPPORT)
PREFILLED_SYRINGE | INTRAVENOUS | Status: DC | PRN
  Administered 2023-12-22 (×3): 160 ug via INTRAVENOUS

## 2023-12-22 MED ORDER — METOPROLOL TARTRATE 25 MG PO TABS
25.0000 mg | ORAL_TABLET | Freq: Once | ORAL | Status: AC
  Administered 2023-12-22: 25 mg via ORAL
  Filled 2023-12-22: qty 1

## 2023-12-22 MED ORDER — PROPOFOL 500 MG/50ML IV EMUL
INTRAVENOUS | Status: DC | PRN
  Administered 2023-12-22: 113 ug/kg/min via INTRAVENOUS

## 2023-12-22 MED ORDER — PROPOFOL 10 MG/ML IV BOLUS
INTRAVENOUS | Status: DC | PRN
  Administered 2023-12-22: 30 mg via INTRAVENOUS
  Administered 2023-12-22 (×2): 20 mg via INTRAVENOUS

## 2023-12-22 MED ORDER — LIDOCAINE 2% (20 MG/ML) 5 ML SYRINGE
INTRAMUSCULAR | Status: DC | PRN
  Administered 2023-12-22: 100 mg via INTRAVENOUS

## 2023-12-22 MED ORDER — SODIUM CHLORIDE 0.9 % IV BOLUS
1000.0000 mL | Freq: Once | INTRAVENOUS | Status: AC
  Administered 2023-12-22: 1000 mL via INTRAVENOUS

## 2023-12-22 NOTE — Progress Notes (Signed)
 [facility] night cross cover note:   Patient inquiring as to whether or not she would be receiving a dose of her home metoprolol  succinate this evening, noting that her metoprolol  succinate 50 mg p.o. daily was held earlier today in the setting of soft blood pressures.  Blood pressure is subsequently improved, and the patient is concerned about ensuing development of elevated heart rate if she continues to be without her beta-blocker in the setting of a history of paroxysmal atrial flutter.  Next dose of her metoprolol  succinate is scheduled to occur tomorrow morning.  I subsequently placed a one-time order for short acting metoprolol  to tartrate 25 mg p.o. x 1 dose now.   Eva Pore, DO Hospitalist

## 2023-12-22 NOTE — Progress Notes (Signed)
 " PROGRESS NOTE    Veronica Bradford  FMW:[id number] DOB: [birth year] DOA: 12/18/2023 PCP: Amon Aloysius BRAVO, MD    Chief Complaint  Patient presents with   Shortness of Breath   aterial flutter   Chest Pain    Brief Narrative:  Briefly patient presents with generalized weakness shortness of breath palpitations and chest pain found to have acute symptomatic anemia with concern for GI bleed given FOBT positive status.  GI has been consulted for further evaluation and treatment.  Xarelto  on hold   Patient also has history of a flutter status post pacemaker currently on Xarelto , on hold as above, as well as chronic diastolic congestive heart failure, hyperlipidemia, GERD, hypertension, obesity, sleep apnea compliant with CPAP.   Initial workup thus far appears to be negative for ACS given negative troponin and unremarkable EKGs.  Cardiology/EP is following along we do appreciate their insight recommendations   Assessment & Plan:   Principal Problem:   Acute blood loss anemia Active Problems:   HTN (hypertension)   Chronic anticoagulation   Dyslipidemia   OSA on CPAP   GERD (gastroesophageal reflux disease)   Chronic diastolic CHF (congestive heart failure) (HCC)   Atrial flutter (HCC)   Symptomatic anemia  #1 symptomatic anemia/rule out GI bleed -Patient noted to have presented with generalized weakness, shortness of breath on minimal exertion, palpitations, chest pain noted to have a drop in hemoglobin down to 8.6 with last hemoglobin on epic noted at 13.2 on 07/25/2023. - Patient denies any overt bleeding and states he is on oral iron supplementation - Patient status post transfusion 2 units PRBCs, 12/20/2023, with posttransfusion hemoglobin noted at 9.8 from 10.6 from 7.5 from 8.3 on admission.  -Patient with clinical improvement. - Xarelto  on hold. - Patient seen in consultation by GI and patient status post upper endoscopy this morning, 12/20/2023, which showed a normal larynx, small  hiatal hernia, gastritis, normal duodenal bulb, first portion of the duodenum, second portion of the duodenum and third portion of the duodenum.  No specimens collected.  - GI recommending colonoscopy which was done this morning which showed external and internal hemorrhoids, diverticulosis in the sigmoid colon and descending colon, no blood seen and no significant lesions seen.  - Follow H&H.  - Transfusion threshold hemoglobin < 8. -Patient placed back on a soft diet and per GI anticoagulation may be resumed on Monday, 12/24/2023 with close outpatient follow-up with GI and PCP. - GI following and appreciate input and recommendations.   2.  Weakness/ambulatory dysfunction -Likely secondary to problem #1. -Improved posttransfusion. - PT/OT.  3.  Atypical chest pain, ACS ruled out/atrial flutter without RVR/hypertension/chronic diastolic heart failure -Rate currently controlled. - Patient seen in consultation by EP/cardiology and patient noted to have recently been transitioned to flecainide  and metoprolol  from Tikosyn . - Flecainide  discontinued by EP and patient holding Xarelto . -Continue Toprol -XL for rate control. - Per EP pending GI workup. -Patient underwent endoscopy and colonoscopy, GI recommending resumption of anticoagulation and today lower dose possible and may restart on Monday, 12/24/2023. - Outpatient follow-up with EP as scheduled on 01/01/2024.  4.  Hyperlipidemia - Continue statin.   5.  Sleep apnea -CPAP nightly.  6.  GERD - PPI.  7.  Soft blood pressure -Likely secondary to dehydration post bowel prep for colonoscopy. - 1 L normal saline bolus.   DVT prophylaxis: SCDs Code Status: Full Family Communication: Updated patient.  No family at bedside. Disposition: Likely home in the next 24 to 48 hours  if continued clinical improvement.  Status is: Inpatient The patient will require care spanning > 2 midnights and should be moved to inpatient because: Severity of  illness   Consultants:  EP: Dr.Carlisle 12/19/2023 GI: Dr. Rosalie 12/19/2023  Procedures:  Chest x-ray 12/18/2023 Transfused 2 units PRBCs  12/20/2023 EGD: Dr. Rosalie 12/21/2023 Colonoscopy 12/22/2023: Per Dr. Rosalie   Antimicrobials:  Anti-infectives (From admission, onward)    None         Subjective: Patient sitting up in bed on his cell phone.  Noted to be eating lunch.  Denies any chest pain or shortness of breath.  Denies any significant palpitations.  Some complaints of fatigue.  Stated had multiple loose watery stools that look like pond water  with bowel prep yesterday.  Patient denies any bleeding.   Objective: Vitals:   12/22/23 0832 12/22/23 0835 12/22/23 0858 12/22/23 1253  BP: (!) 104/56 114/66 (!) 91/49 (!) 94/48  Pulse: 70 69  63  Resp: (!) 22 20  16   Temp:  (!) 97.5 F (36.4 C) (!) 97.4 F (36.3 C) 98.2 F (36.8 C)  TempSrc:   Oral Oral  SpO2: 98% 96%  99%  Weight:      Height:        Intake/Output Summary (Last 24 hours) at 12/22/2023 1343 Last data filed at 12/22/2023 9188 Gross per 24 hour  Intake 400 ml  Output --  Net 400 ml   Filed Weights   12/19/23 1500  Weight: 102.8 kg    Examination:  General exam: NAD. Respiratory system: CTAB.  No wheezes, no crackles, no rhonchi.  Fair air movement.  Speaking in full sentences.  No use of accessory muscles of respiration Cardiovascular system: Irregularly irregular.  No JVD, no murmurs rubs or gallops.  No lower extremity edema.  Gastrointestinal system: Abdomen is soft, nontender, nondistended, positive bowel sounds.  No rebound.  No guarding.  Central nervous system: Alert and oriented. No focal neurological deficits. Extremities: Symmetric 5 x 5 power. Skin: No rashes, lesions or ulcers Psychiatry: Judgement and insight appear normal. Mood & affect appropriate.     Data Reviewed: I have personally reviewed following labs and imaging studies  CBC: Recent Labs  Lab 12/18/23 1217  12/19/23 0302 12/20/23 0426 12/21/23 0548 12/22/23 0432  WBC 5.6 7.8 6.4 5.9 5.7  NEUTROABS 3.9  --  3.8  --   --   HGB 8.6* 8.3* 7.5* 10.6* 9.8*  HCT 28.2* 27.4* 23.8* 32.2* 31.1*  MCV 88.1 87.5 86.9 85.9 86.4  PLT 311 316 261 282 276    Basic Metabolic Panel: Recent Labs  Lab 12/18/23 1217 12/19/23 0302 12/20/23 0426 12/21/23 0548 12/22/23 0432  NA 139 141 139 142 142  K 4.1 3.8 3.8 3.9 3.7  CL 107 108 110 111 110  CO2 21* 24 21* 21* 23  GLUCOSE 82 94 88 87 79  BUN 13 14 13 10  7*  CREATININE 0.61 0.59 0.67 0.61 0.62  CALCIUM  8.9 8.8* 8.2* 9.3 8.7*  MG 2.0  --   --  2.0 1.9    GFR: Estimated Creatinine Clearance: 74.4 mL/min (by C-G formula based on SCr of 0.62 mg/dL).  Liver Function Tests: Recent Labs  Lab 12/18/23 1217  AST 23  ALT 13  ALKPHOS 57  BILITOT 0.3  PROT 5.7*  ALBUMIN 3.7    CBG: No results for input(s): GLUCAP in the last 168 hours.   No results found for this or any previous visit (from the past  240 hours).       Radiology Studies: No results found.      Scheduled Meds:  colestipol   2 g Oral BID   Ferrous Fumarate   1 tablet Oral Daily   fiber  1 packet Per Tube BID   metoprolol  succinate  50 mg Oral Daily   pantoprazole   40 mg Oral BID   pravastatin   40 mg Oral QPM   Continuous Infusions:  sodium chloride         LOS: 2 days    Time spent: 40 minutes    Toribio Hummer, MD [facility]   To contact the attending provider between 7A-7P or the covering provider during after hours 7P-7A, please log into the web site [email or web address] and access using universal [place] password for that web site. If you do not have the password, please call the hospital operator.  12/22/2023, 1:43 PM    "

## 2023-12-22 NOTE — Progress Notes (Signed)
 Veronica Bradford 7:26 AM  Subjective: Patient doing well no signs of bleeding we rediscussed the procedure no new complaints  Objective: Vital signs stable afebrile no acute distress exam please see preassessment evaluation labs stable  Assessment: Guaiac positive anemia in patient on blood thinners  Plan: Okay to proceed with colonoscopy with anesthesia assistance  [facility]  office [phone] After 5PM or if no answer call [phone]

## 2023-12-22 NOTE — Transfer of Care (Signed)
 Immediate Anesthesia Transfer of Care Note  Patient: Veronica Bradford  Procedure(s) Performed: COLONOSCOPY  Patient Location: PACU  Anesthesia Type:MAC  Level of Consciousness: awake  Airway & Oxygen Therapy: Patient Spontanous Breathing and Patient connected to nasal cannula oxygen  Post-op Assessment: Report given to RN and Post -op Vital signs reviewed and stable  Post vital signs: Reviewed and stable  Last Vitals:  Vitals Value Taken Time  BP 130/110 12/22/23 08:10  Temp    Pulse 78 12/22/23 08:12  Resp 16 12/22/23 08:12  SpO2 99 % 12/22/23 08:12  Vitals shown include unfiled device data.  Last Pain:  Vitals:   12/22/23 0722  TempSrc: Temporal  PainSc: 0-No pain      Patients Stated Pain Goal: 0 (12/21/23 2049)  Complications: No notable events documented.

## 2023-12-22 NOTE — Anesthesia Postprocedure Evaluation (Signed)
"   Anesthesia Post Note  Patient: Veronica Bradford  Procedure(s) Performed: COLONOSCOPY     Patient location during evaluation: PACU Anesthesia Type: MAC Level of consciousness: awake and alert Pain management: pain level controlled Vital Signs Assessment: post-procedure vital signs reviewed and stable Respiratory status: spontaneous breathing, nonlabored ventilation, respiratory function stable and patient connected to nasal cannula oxygen Cardiovascular status: stable and blood pressure returned to baseline Postop Assessment: no apparent nausea or vomiting Anesthetic complications: no   No notable events documented.  Last Vitals:  Vitals:   12/22/23 0858 12/22/23 1253  BP: (!) 91/49 (!) 94/48  Pulse:  63  Resp:  16  Temp: (!) 36.3 C 36.8 C  SpO2:  99%    Last Pain:  Vitals:   12/22/23 1253  TempSrc: Oral  PainSc: 0-No pain                 Lynwood MARLA Cornea      "

## 2023-12-22 NOTE — Plan of Care (Signed)
  Problem: Health Behavior/Discharge Planning: Goal: Ability to manage health-related needs will improve Outcome: Progressing   Problem: Clinical Measurements: Goal: Respiratory complications will improve Outcome: Progressing Goal: Cardiovascular complication will be avoided Outcome: Progressing   Problem: Activity: Goal: Risk for activity intolerance will decrease Outcome: Progressing   Problem: Safety: Goal: Ability to remain free from injury will improve Outcome: Progressing

## 2023-12-22 NOTE — Plan of Care (Signed)
   Problem: Health Behavior/Discharge Planning: Goal: Ability to manage health-related needs will improve Outcome: Progressing   Problem: Clinical Measurements: Goal: Ability to maintain clinical measurements within normal limits will improve Outcome: Progressing   Problem: Clinical Measurements: Goal: Will remain free from infection Outcome: Progressing

## 2023-12-22 NOTE — Op Note (Signed)
 [facility] Patient Name: Veronica Bradford Procedure Date : 12/22/2023 MRN: [id number] Attending MD: Oliva Boots , MD, [id number] Date of Birth: 09/25/1945 CSN: [id number] Age: 78 Admit Type: Inpatient Procedure:                Colonoscopy Indications:              Last colonoscopy: 2022, Heme positive stool, Acute                            post hemorrhagic anemia Providers:                Oliva Boots, MD, Collene Edu, RN, Haskel Chris,                            Technician Referring MD:              Medicines:                Monitored Anesthesia Care Complications:            No immediate complications. Estimated Blood Loss:     Estimated blood loss: none. Procedure:                Pre-Anesthesia Assessment:                           - Prior to the procedure, a History and Physical                            was performed, and patient medications and                            allergies were reviewed. The patient's tolerance of                            previous anesthesia was also reviewed. The risks                            and benefits of the procedure and the sedation                            options and risks were discussed with the patient.                            All questions were answered, and informed consent                            was obtained. Prior Anticoagulants: The patient has                            taken Xarelto  (rivaroxaban ), last dose was 3 days                            prior to procedure. ASA Grade Assessment: III - A  patient with severe systemic disease. After                            reviewing the risks and benefits, the patient was                            deemed in satisfactory condition to undergo the                            procedure.                           After obtaining informed consent, the colonoscope                            was passed under direct vision. Throughout the                             procedure, the patient's blood pressure, pulse, and                            oxygen saturations were monitored continuously. The                            [id number]-HQ190L ([id number]) Olympus colonoscope was                            introduced through the anus and advanced to the the                            cecum, identified by appendiceal orifice and                            ileocecal valve. The colonoscopy was performed                            without difficulty. The patient tolerated the                            procedure well. The quality of the bowel                            preparation was fair. Scope In: 7:45:06 AM Scope Out: 8:04:56 AM Scope Withdrawal Time: 0 hours 14 minutes 0 seconds  Total Procedure Duration: 0 hours 19 minutes 50 seconds  Findings:      External and internal hemorrhoids were found during retroflexion, during       perianal exam and during digital exam. The hemorrhoids were small.      Multiple small-mouthed diverticula were found in the sigmoid colon.      A few small-mouthed diverticula were found in the descending colon.      No additional abnormalities were found on retroflexion. Impression:               - Preparation of the colon was fair. However no  blood was seen and no significant lesions were seen                           - External and internal hemorrhoids.                           - Diverticulosis in the sigmoid colon.                           - Diverticulosis in the descending colon.                           - No specimens collected. Recommendation:           - Soft diet today. Okay with me to go home once                            blood thinner needs are determined as below                           - Continue present medications. Reevaluate blood                            thinners needs and use as lower dose as possible                            that makes patient and cardiology happy  and okay to                            restart on Monday unless needed sooner as needed                           - Repeat colonoscopy in 5 years for screening                            purposes. If doing well medically                           - Return to GI office in 2 weeks. To recheck guaiac                            CBC and see if capsule endoscopy is needed                           - Telephone GI clinic if symptomatic PRN. Procedure Code(s):        --- Professional ---                           [phone], Colonoscopy, flexible; diagnostic, including                            collection of specimen(s) by brushing or washing,  when performed (separate procedure) Diagnosis Code(s):        --- Professional ---                           R19.5, Other fecal abnormalities                           D62, Acute posthemorrhagic anemia                           K57.30, Diverticulosis of large intestine without                            perforation or abscess without bleeding CPT copyright 2022 American [employer] Association. All rights reserved. The codes documented in this report are preliminary and upon coder review may  be revised to meet current compliance requirements. Oliva Boots, MD 12/22/2023 8:15:31 AM This report has been signed electronically. Number of Addenda: 0

## 2023-12-23 ENCOUNTER — Other Ambulatory Visit (HOSPITAL_COMMUNITY): Payer: Self-pay

## 2023-12-23 ENCOUNTER — Encounter (HOSPITAL_COMMUNITY): Payer: Self-pay | Admitting: Gastroenterology

## 2023-12-23 DIAGNOSIS — E785 Hyperlipidemia, unspecified: Secondary | ICD-10-CM | POA: Diagnosis not present

## 2023-12-23 DIAGNOSIS — G4733 Obstructive sleep apnea (adult) (pediatric): Secondary | ICD-10-CM | POA: Diagnosis not present

## 2023-12-23 DIAGNOSIS — I10 Essential (primary) hypertension: Secondary | ICD-10-CM | POA: Diagnosis not present

## 2023-12-23 DIAGNOSIS — Z7901 Long term (current) use of anticoagulants: Secondary | ICD-10-CM | POA: Diagnosis not present

## 2023-12-23 DIAGNOSIS — K219 Gastro-esophageal reflux disease without esophagitis: Secondary | ICD-10-CM | POA: Diagnosis not present

## 2023-12-23 DIAGNOSIS — D649 Anemia, unspecified: Secondary | ICD-10-CM | POA: Diagnosis not present

## 2023-12-23 DIAGNOSIS — I5032 Chronic diastolic (congestive) heart failure: Secondary | ICD-10-CM | POA: Diagnosis not present

## 2023-12-23 DIAGNOSIS — I4892 Unspecified atrial flutter: Secondary | ICD-10-CM | POA: Diagnosis not present

## 2023-12-23 DIAGNOSIS — D62 Acute posthemorrhagic anemia: Secondary | ICD-10-CM | POA: Diagnosis not present

## 2023-12-23 LAB — CBC
HCT: 30.6 % — ABNORMAL LOW (ref 36.0–46.0)
Hemoglobin: 9.6 g/dL — ABNORMAL LOW (ref 12.0–15.0)
MCH: 27.1 pg (ref 26.0–34.0)
MCHC: 31.4 g/dL (ref 30.0–36.0)
MCV: 86.4 fL (ref 80.0–100.0)
Platelets: 242 K/uL (ref 150–400)
RBC: 3.54 MIL/uL — ABNORMAL LOW (ref 3.87–5.11)
RDW: 17.2 % — ABNORMAL HIGH (ref 11.5–15.5)
WBC: 5.4 K/uL (ref 4.0–10.5)
nRBC: 0 % (ref 0.0–0.2)

## 2023-12-23 LAB — BASIC METABOLIC PANEL WITH GFR
Anion gap: 8 (ref 5–15)
BUN: 9 mg/dL (ref 8–23)
CO2: 25 mmol/L (ref 22–32)
Calcium: 8.7 mg/dL — ABNORMAL LOW (ref 8.9–10.3)
Chloride: 108 mmol/L (ref 98–111)
Creatinine, Ser: 0.61 mg/dL (ref 0.44–1.00)
GFR, Estimated: >60 mL/min
Glucose, Bld: 94 mg/dL (ref 70–99)
Potassium: 4.1 mmol/L (ref 3.5–5.1)
Sodium: 141 mmol/L (ref 135–145)

## 2023-12-23 MED ORDER — METOPROLOL SUCCINATE ER 50 MG PO TB24: 50.0000 mg | ORAL_TABLET | Freq: Every day | ORAL | Status: DC

## 2023-12-23 MED ORDER — APIXABAN 5 MG PO TABS
5.0000 mg | ORAL_TABLET | Freq: Two times a day (BID) | ORAL | 0 refills | Status: DC
  Filled 2023-12-23: qty 180, 90d supply, fill #0

## 2023-12-23 MED ORDER — APIXABAN 5 MG PO TABS: 5.0000 mg | ORAL_TABLET | Freq: Two times a day (BID) | ORAL | 0 refills | Status: AC

## 2023-12-23 NOTE — Evaluation (Signed)
 Physical Therapy Evaluation Patient Details Name: Veronica Bradford MRN: [id number] DOB: Jan 23, 1945 Today's Date: 12/23/2023  History of Present Illness  The pt is a 78 yo female presenting 12/16 from cardiologist with a flutter x1 month and SOB. Work up revealed anemia, pt s/p upper endoscopy 12/19 and colonoscopy 12/20 which showed external and internal hemorrhoids and diverticulitis. PMH includes: afib, CHF, endometrial cancer, HLD, HTN, pacemaker, OSA, obesity (BMI 34), L THA, and R TKA.   Clinical Impression  Pt in bed upon arrival of PT, agreeable to evaluation at this time. Prior to admission the pt was completely independent without use of DME, walks 2 miles on a daily basis, and is independent with all ADLs and IADLs. The pt was able to complete all transfers without assist or DME, but does demo increased sway and slowed speed with gait. Pt endorses SOB with gait after ~150 ft, HR 100-110bpm. Given significant decline in endurance due to persistent afib, pt provided with education on progressive walking program for home and HEP for whole-body strengthening she can complete at home. Anticipate no follow up PT needs after medically stable for d/c.       If plan is discharge home, recommend the following: A little help with walking and/or transfers;Help with stairs or ramp for entrance   Can travel by private vehicle        Equipment Recommendations None recommended by PT  Recommendations for Other Services       Functional Status Assessment Patient has had a recent decline in their functional status and demonstrates the ability to make significant improvements in function in a reasonable and predictable amount of time.     Precautions / Restrictions Precautions Precautions: Fall Recall of Precautions/Restrictions: Intact Precaution/Restrictions Comments: watch HR Restrictions Weight Bearing Restrictions Per Provider Order: No      Mobility  Bed Mobility Overal bed mobility:  Independent                  Transfers Overall transfer level: Independent Equipment used: None               General transfer comment: no assist, no UE support, HR from 60 to 80bpm    Ambulation/Gait Ambulation/Gait assistance: Contact guard assist Gait Distance (Feet): 250 Feet Assistive device: None Gait Pattern/deviations: Step-through pattern, Decreased stride length, Drifts right/left Gait velocity: decreased Gait velocity interpretation: <1.31 ft/sec, indicative of household ambulator   General Gait Details: pt with slowed speed and increased sway. no overt LOB but reports SOB with ~150 ft, HR to 110s     Balance Overall balance assessment: Needs assistance Sitting-balance support: Feet supported, No upper extremity supported Sitting balance-Leahy Scale: Normal     Standing balance support: No upper extremity supported, During functional activity Standing balance-Leahy Scale: Good                               Pertinent Vitals/Pain Pain Assessment Pain Assessment: No/denies pain    Home Living Family/patient expects to be discharged to:: Private residence Living Arrangements: Spouse/significant other;Children Available Help at Discharge: Family;Available 24 hours/day Type of Home: House Home Access: Stairs to enter (ramp to 2 stairs) Entrance Stairs-Rails: Left Entrance Stairs-Number of Steps: 2 Alternate Level Stairs-Number of Steps: flight Home Layout: Two level;Laundry or work area in basement;Able to live on main level with bedroom/bathroom Home Equipment: Production Assistant, Radio - single point      Prior Function Prior Level of  Function : Independent/Modified Independent;Driving             Mobility Comments: walks 2 miles per day ADLs Comments: independent, adult son does laundnry and helps with cooking     Extremity/Trunk Assessment   Upper Extremity Assessment Upper Extremity Assessment: Overall WFL for tasks assessed     Lower Extremity Assessment Lower Extremity Assessment: Overall WFL for tasks assessed (mild weakness in L hip, chronic after L THA)    Cervical / Trunk Assessment Cervical / Trunk Assessment: Normal  Communication   Communication Communication: No apparent difficulties    Cognition Arousal: Alert Behavior During Therapy: WFL for tasks assessed/performed   PT - Cognitive impairments: No apparent impairments                         Following commands: Intact       Cueing Cueing Techniques: Verbal cues     General Comments General comments (skin integrity, edema, etc.): HR 60-70 at rest, 80 in standing, 100-110 bpm with gait. in afib rhythm.    Exercises General Exercises - Lower Extremity Long Arc Quad: Both, Strengthening, 10 reps, Seated Hip Flexion/Marching: AROM, Both, 10 reps Heel Raises: Strengthening, Both, 10 reps, Standing   Assessment/Plan    PT Assessment Patient needs continued PT services  PT Problem List Cardiopulmonary status limiting activity;Decreased activity tolerance       PT Treatment Interventions DME instruction;Gait training;Stair training;Therapeutic exercise;Functional mobility training;Therapeutic activities;Balance training;Patient/family education    PT Goals (Current goals can be found in the Care Plan section)  Acute Rehab PT Goals Patient Stated Goal: to maintain strength PT Goal Formulation: With patient Time For Goal Achievement: 12/30/23 Potential to Achieve Goals: Good    Frequency Min 1X/week        AM-PAC PT 6 Clicks Mobility  Outcome Measure Help needed turning from your back to your side while in a flat bed without using bedrails?: None Help needed moving from lying on your back to sitting on the side of a flat bed without using bedrails?: None Help needed moving to and from a bed to a chair (including a wheelchair)?: None Help needed standing up from a chair using your arms (e.g., wheelchair or bedside  chair)?: None Help needed to walk in hospital room?: A Little Help needed climbing 3-5 steps with a railing? : A Little 6 Click Score: 22    End of Session Equipment Utilized During Treatment: Gait belt Activity Tolerance: Patient tolerated treatment well Patient left: in chair;with call bell/phone within reach Nurse Communication: Mobility status PT Visit Diagnosis: Unsteadiness on feet (R26.81)    Time: 9159-9069 PT Time Calculation (min) (ACUTE ONLY): 50 min   Charges:     PT Treatments $Gait Training: 8-22 mins $Therapeutic Exercise: 23-37 mins PT General Charges $$ ACUTE PT VISIT: 1 Visit         Izetta Call, PT, DPT   Acute Rehabilitation Department Office [phone] Secure Chat Communication Preferred  Izetta JULIANNA Call 12/23/2023, 9:52 AM

## 2023-12-23 NOTE — Discharge Instructions (Signed)

## 2023-12-23 NOTE — Discharge Summary (Signed)
 Physician Discharge Summary  Veronica Bradford FMW:[id number] DOB: 11/26/1945 DOA: 12/18/2023  PCP: Amon Aloysius BRAVO, MD  Admit date: 12/18/2023 Discharge date: 12/23/2023  Time spent: 60 minutes  Recommendations for Outpatient Follow-up:  Follow-up with Amon Aloysius BRAVO, MD in 1-2 weeks.  On follow-up patient will need a basic metabolic profile done to follow-up on electrolytes and renal function.  Patient will need a CBC done to follow-up on counts. Follow-up with Dr. Rosalie, gastroenterology in 2 weeks. Follow-up with Dr. Kennyth, EP/cardiology as scheduled on 01/01/2024.   Discharge Diagnoses:  Principal Problem:   Acute blood loss anemia Active Problems:   HTN (hypertension)   Chronic anticoagulation   Dyslipidemia   OSA on CPAP   GERD (gastroesophageal reflux disease)   Chronic diastolic CHF (congestive heart failure) (HCC)   Atrial flutter (HCC)   Symptomatic anemia   Discharge Condition: Stable and improved.  Diet recommendation: Heart healthy  Filed Weights   12/19/23 1500  Weight: 102.8 kg    History of present illness:  HPI per Dr. Vernon Silvano DELENA Bradford is a 77 y.o. female with medical history significant of atrial flutter on Xarelto  also status post pacemaker, chronic diastolic congestive heart failure, dyslipidemia, GERD, hypertension, obesity, OSA on CPAP presented to ED with complaint of palpitation, shortness of breath and chest pain.  Per patient, shortness of breath started about a month ago but has been gradually worsening.  She has been concerned that she has atrial flutter.  Denies any other complaint such as fever, chills, nausea, vomiting, chest pain, any problem with urination or with bowel movement.  Patient is on iron pills but denies any melena, hematochezia or hematemesis.   According to patient and per notes, patient due to having feeling of palpitation went to see device clinic today.  She was informed that the settings were not correct and the information that  cardiologist was receiving was incorrect and she was told that she was in atrial flutter.  This was discussed with Dr. Cindie of cardiology who recommended for the patient to come to the emergency department.  Per their note, EP PA will see patient here.   ED Course: Upon arrival to ED, patient was hemodynamically stable, 1 brief episode of heart rate at 130 which was several hours ago, heart rate has been around 100 after that.  Not hypoxic.  BMP unremarkable.  CBC showed hemoglobin of 8.6 down from 13.24 months ago with positive FOBT.  Received no medications in the ED.  Hospitalist called for admission.  Hospital Course:  #1 symptomatic anemia/rule out GI bleed -Patient noted to have presented with generalized weakness, shortness of breath on minimal exertion, palpitations, chest pain noted to have a drop in hemoglobin down to 8.6 with last hemoglobin on epic noted at 13.2 on 07/25/2023. - Patient denied any overt bleeding and states she is on oral iron supplementation - Patient status post transfusion 2 units PRBCs, 12/20/2023, with posttransfusion hemoglobin noted at 9.6 by day of discharge from 9.8 from 10.6 from 7.5 from 8.3 on admission.  -Patient with clinical improvement posttransfusion.. - Xarelto  held during the hospitalization. - Patient seen in consultation by GI and patient status post upper endoscopy, 12/20/2023, which showed a normal larynx, small hiatal hernia, gastritis, normal duodenal bulb, first portion of the duodenum, second portion of the duodenum and third portion of the duodenum.  No specimens collected.  - GI recommended colonoscopy which was done (12/22/2023) which showed external and internal hemorrhoids, diverticulosis in the sigmoid  colon and descending colon, no blood seen and no significant lesions seen.  -Patient placed back on a soft diet and per GI anticoagulation may be resumed on Monday, 12/24/2023 with close outpatient follow-up with GI and PCP. -Patient be  discharged in stable improved condition with outpatient follow-up with GI, PCP.   2.  Weakness/ambulatory dysfunction -Likely secondary to problem #1. -Improved posttransfusion. - Outpatient follow-up with PCP.   3.  Atypical chest pain, ACS ruled out/atrial flutter without RVR/hypertension/chronic diastolic heart failure -Rate controlled during the hospitalization. - Patient seen in consultation by EP/cardiology and patient noted to have recently been transitioned to flecainide  and metoprolol  from Tikosyn . - Flecainide  discontinued by EP during the hospitalization and patient holding Xarelto . - Patient maintained on Toprol -XL at 50 mg daily during the hospitalization per cardiology/EP. -Patient underwent endoscopy and colonoscopy, GI recommending resumption of anticoagulation to be resumed on Monday, 12/24/2023.  -In discussion with patient Xarelto  was changed to Eliquis  per patient preference due to decreased risk of bleeding.   - Outpatient follow-up with EP as scheduled on 01/01/2024. -Flecainide  will be held on discharge until follow-up with EP   4.  Hyperlipidemia - Patient maintained on home regimen statin.    5.  Sleep apnea -CPAP nightly.   6.  GERD - Patient maintained on PPI.   7.  Soft blood pressure -Likely secondary to dehydration post bowel prep for colonoscopy. - Resolved with fluid bolus.   - Outpatient follow-up.      Procedures: Chest x-ray 12/18/2023 Transfused 2 units PRBCs  12/20/2023 EGD: Dr. Rosalie 12/21/2023 Colonoscopy 12/22/2023: Per Dr. Rosalie    Consultations: EP: Dr.Carlisle 12/19/2023 GI: Dr. Rosalie 12/19/2023  Discharge Exam: Vitals:   12/23/23 0520 12/23/23 0930  BP: 123/65 (!) 117/59  Pulse: 60 67  Resp: 18 16  Temp: 97.6 F (36.4 C) 97.9 F (36.6 C)  SpO2: 99% 99%    General: NAD Cardiovascular: Irregularly irregular. Respiratory: CTAB.  No wheezes, no crackles, no rhonchi.  Fair air movement.  Speaking in full  sentences.  Discharge Instructions   Discharge Instructions     Diet - low sodium heart healthy   Complete by: As directed    Increase activity slowly   Complete by: As directed       Allergies as of 12/23/2023       Reactions   Ibuprofen Other (See Comments)   Pt is on Xarelto    Nsaids Other (See Comments)   Pt on Xarelto    Tolmetin Other (See Comments)   Pt on Xarelto    Zoster Vaccine Live Other (See Comments)   Serum sickness, high fever, neck pain    Tape Other (See Comments)   Causes blisters   Amoxicillin Rash   Lactose Intolerance (gi) Diarrhea, Nausea And Vomiting   Lisinopril Cough   Penicillins Rash   Local reaction        Medication List     PAUSE taking these medications    flecainide  50 MG tablet Wait to take this until your doctor or other care provider tells you to start again. Commonly known as: TAMBOCOR  Take 1.5 tablets (75 mg total) by mouth 2 (two) times daily.   rivaroxaban  20 MG Tabs tablet Wait to take this until your doctor or other care provider tells you to start again. Commonly known as: XARELTO  Take 20 mg by mouth daily with supper.       TAKE these medications    acetaminophen  500 MG tablet Commonly known as: TYLENOL  Take  500 mg by mouth every 6 (six) hours as needed for moderate pain or headache.   acetaminophen -codeine  300-30 MG tablet Commonly known as: TYLENOL  #3 Take 0.5-1 tablets by mouth every 6 (six) hours as needed for moderate pain (pain score 4-6).   apixaban  5 MG Tabs tablet Commonly known as: ELIQUIS  Take 1 tablet (5 mg total) by mouth 2 (two) times daily. Start taking on: December 24, 2023   ARTIFICIAL TEAR SOLUTION OP Place 1 drop into both eyes 4 (four) times daily as needed (dry/irritated eyes.).   Benefiber Powd Take 1 Scoop by mouth in the morning. Mix one scoop of powder with morning coffee and drink daily.   Benefiber Chew Chew 2 each by mouth every evening.   colestipol  1 g tablet Commonly  known as: COLESTID  Take 2 g by mouth 2 (two) times daily.   CoQ10 200 MG Caps Take 200 mg by mouth daily.   CRANBERRY EXTRACT PO Take 1 tablet by mouth 2 (two) times daily.   estradiol  0.1 MG/GM vaginal cream Commonly known as: ESTRACE  Place 1 Applicatorful vaginally 3 (three) times a week.   ferrous fumarate  325 (106 Fe) MG Tabs tablet Commonly known as: HEMOCYTE - 106 mg FE Take 1 tablet (106 mg of iron total) by mouth daily. Take apart from pantoprazole    fluticasone  50 MCG/ACT nasal spray Commonly known as: FLONASE  Place 1 spray into both nostrils daily.   furosemide  40 MG tablet Commonly known as: LASIX  TAKE 1 TABLET BY MOUTH AS NEEDED   MAGNESIUM  GLYCINATE PO Take 1,000 mg by mouth daily as needed (upset stomach). Magnesium  gummies   metoprolol  succinate 50 MG 24 hr tablet Commonly known as: TOPROL -XL Take 1 tablet (50 mg total) by mouth daily. Take with or immediately following a meal. What changed: when to take this   olopatadine  0.1 % ophthalmic solution Commonly known as: PATANOL Place 1 drop into both eyes daily as needed (allergies).   pantoprazole  40 MG tablet Commonly known as: PROTONIX  Take 1 tablet (40 mg total) by mouth 2 (two) times daily.   potassium chloride  SA 20 MEQ tablet Commonly known as: KLOR-CON  M Take 1 tablet (20 mEq total) by mouth daily.   pravastatin  40 MG tablet Commonly known as: PRAVACHOL  Take 1 tablet (40 mg total) by mouth every evening.   SENIOR MULTIVITAMIN PLUS PO Take 1 tablet by mouth in the morning.   sucralfate  1 g tablet Commonly known as: CARAFATE  Take 1 g by mouth 2 (two) times daily as needed (upset stomach).   SUPER B COMPLEX/VITAMIN C PO Take 1 tablet by mouth daily.   valACYclovir  1000 MG tablet Commonly known as: VALTREX  Take 1 tablet (1,000 mg total) by mouth 3 (three) times daily.   Vitamin D3 50 MCG (2000 UT) capsule Take 2,000 Units by mouth daily.   Zepbound  7.5 MG/0.5ML Pen Generic drug:  tirzepatide  Inject 7.5 mg into the skin once a week. What changed: when to take this       Allergies[1]  Follow-up Information     Paz, Jose E, MD. Schedule an appointment as soon as possible for a visit in 2 week(s).   Specialty: Internal Medicine Why: Follow-up in 1 to 2 weeks. Contact information: [address] [place] [address] [phone]         Rosalie Kitchens, MD. Schedule an appointment as soon as possible for a visit in 2 week(s).   Specialty: Gastroenterology Contact information: [address] [place] [address] [phone]         Kennyth Chew, MD Follow up on 01/01/2024.   Specialties: Cardiology, Radiology Why: Follow-up as scheduled. Contact information: [address] [place] [address] [phone]                  The results of significant diagnostics from this hospitalization (including imaging, microbiology, ancillary and laboratory) are listed below for reference.    Significant Diagnostic Studies: DG Chest 1 View Result Date: 12/18/2023 CLINICAL DATA:  Chest pain EXAM: CHEST  1 VIEW COMPARISON:  November 29, 2022 FINDINGS: The heart size and mediastinal contours are within normal limits. Left-sided pacemaker is unchanged. Both lungs are clear. The visualized skeletal structures are unremarkable. IMPRESSION: No active disease. Electronically Signed   By: Lynwood Landy Raddle M.D.   On: 12/18/2023 13:03   CUP PACEART INCLINIC DEVICE CHECK Result Date: 12/18/2023 Patient brought into device clinic d/t previous device alert w/ patient c/o SOB/Fatigue. Device interrogation and programming changes performed w/ [facility] Representative, [place]. Presenting Rhythm AFL/VS 91 irreg. R-R. Normal device function. Patient given ED precautions d/t rhythm and c/o SOB. Patient to visit ED directly after OV. Previous device alert believed to be AP/VS/VP w/ FF present on RA lead causing mode switch to DDI resulting in low  PR interval which could contribute to current symptoms. 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 On Xarelto  per EPIC. Current AT/AF burden 2.6%, believed to be higher d/t undersensing. With programming changes will be able to see more accurate AT/AF burden. Given patient symptoms Dr. Cindie recommended ED precautions. Patient verbalized understanding and agreeable w/ plan. Programming Changes per [facility] Rep: - Atrial Max Sensitivity reprogrammed from 0.73mV to 0.84mV d/t undersensing AFL/AF waves. - Channel 1: Stored EGM Configuration reprogrammed from Atrial Sense Amp to Atrial Bipolar.Rozelle Banter, BSN, RN  CUP PACEART REMOTE DEVICE CHECK Result Date: 12/12/2023 Pacemaker: Scheduled remote reviewed. Normal device function.  Presenting rhythm: AS/VS Known PAF, AF burden 1%, Xarelto  per [facility] EMR Next remote transmission per protocol. ML, CVRS  MM 3D DIAGNOSTIC MAMMOGRAM UNILATERAL RIGHT BREAST Result Date: 12/06/2023 CLINICAL DATA:  78 year old presenting with paresthesias involving the outer periareolar RIGHT breast. Family history of breast cancer in her mother in her 5s. EXAM: DIGITAL DIAGNOSTIC UNILATERAL RIGHT MAMMOGRAM WITH TOMOSYNTHESIS AND CAD; ULTRASOUND RIGHT BREAST LIMITED TECHNIQUE: Right digital diagnostic mammography and breast tomosynthesis was performed. The images were evaluated with computer-aided detection. ; Targeted ultrasound examination of the right breast was performed COMPARISON:  Previous exam(s). ACR Breast Density Category b: There are scattered areas of fibroglandular density. FINDINGS: Full field CC and MLO views and a spot tangential view of the area of concern were obtained. No findings suspicious for malignancy. No mammographic abnormality in the outer periareolar location in the area of paresthesia. Targeted ultrasound is performed in the area of concern, demonstrating normal scattered fibroglandular tissue at  10 o'clock 3 cm from the nipple. No cyst, solid mass or abnormal acoustic shadowing is identified. On correlative physical examination, there is no palpable abnormality in the outer periareolar location. There is no skin erythema. IMPRESSION: No mammographic or sonographic evidence of malignancy involving the RIGHT breast. RECOMMENDATION: Annual BILATERAL screening mammography which is due in March, 2026. I have discussed the findings and recommendations with the patient. If applicable, a reminder letter will be sent to the patient regarding the next appointment. BI-RADS CATEGORY  1: Negative. Electronically Signed  By: Debby Satterfield M.D.   On: 12/06/2023 13:53   US  LIMITED ULTRASOUND INCLUDING AXILLA RIGHT BREAST Result Date: 12/06/2023 CLINICAL DATA:  78 year old presenting with paresthesias involving the outer periareolar RIGHT breast. Family history of breast cancer in her mother in her 29s. EXAM: DIGITAL DIAGNOSTIC UNILATERAL RIGHT MAMMOGRAM WITH TOMOSYNTHESIS AND CAD; ULTRASOUND RIGHT BREAST LIMITED TECHNIQUE: Right digital diagnostic mammography and breast tomosynthesis was performed. The images were evaluated with computer-aided detection. ; Targeted ultrasound examination of the right breast was performed COMPARISON:  Previous exam(s). ACR Breast Density Category b: There are scattered areas of fibroglandular density. FINDINGS: Full field CC and MLO views and a spot tangential view of the area of concern were obtained. No findings suspicious for malignancy. No mammographic abnormality in the outer periareolar location in the area of paresthesia. Targeted ultrasound is performed in the area of concern, demonstrating normal scattered fibroglandular tissue at 10 o'clock 3 cm from the nipple. No cyst, solid mass or abnormal acoustic shadowing is identified. On correlative physical examination, there is no palpable abnormality in the outer periareolar location. There is no skin erythema. IMPRESSION: No  mammographic or sonographic evidence of malignancy involving the RIGHT breast. RECOMMENDATION: Annual BILATERAL screening mammography which is due in March, 2026. I have discussed the findings and recommendations with the patient. If applicable, a reminder letter will be sent to the patient regarding the next appointment. BI-RADS CATEGORY  1: Negative. Electronically Signed   By: Debby Satterfield M.D.   On: 12/06/2023 13:53    Microbiology: No results found for this or any previous visit (from the past 240 hours).   Labs: Basic Metabolic Panel: Recent Labs  Lab 12/18/23 1217 12/19/23 0302 12/20/23 0426 12/21/23 0548 12/22/23 0432 12/23/23 0208  NA 139 141 139 142 142 141  K 4.1 3.8 3.8 3.9 3.7 4.1  CL 107 108 110 111 110 108  CO2 21* 24 21* 21* 23 25  GLUCOSE 82 94 88 87 79 94  BUN 13 14 13 10  7* 9  CREATININE 0.61 0.59 0.67 0.61 0.62 0.61  CALCIUM  8.9 8.8* 8.2* 9.3 8.7* 8.7*  MG 2.0  --   --  2.0 1.9  --    Liver Function Tests: Recent Labs  Lab 12/18/23 1217  AST 23  ALT 13  ALKPHOS 57  BILITOT 0.3  PROT 5.7*  ALBUMIN 3.7   No results for input(s): LIPASE, AMYLASE in the last 168 hours. No results for input(s): AMMONIA in the last 168 hours. CBC: Recent Labs  Lab 12/18/23 1217 12/19/23 0302 12/20/23 0426 12/21/23 0548 12/22/23 0432 12/23/23 0208  WBC 5.6 7.8 6.4 5.9 5.7 5.4  NEUTROABS 3.9  --  3.8  --   --   --   HGB 8.6* 8.3* 7.5* 10.6* 9.8* 9.6*  HCT 28.2* 27.4* 23.8* 32.2* 31.1* 30.6*  MCV 88.1 87.5 86.9 85.9 86.4 86.4  PLT 311 316 261 282 276 242   Cardiac Enzymes: No results for input(s): CKTOTAL, CKMB, CKMBINDEX, TROPONINI in the last 168 hours. BNP: BNP (last 3 results) No results for input(s): BNP in the last 8760 hours.  ProBNP (last 3 results) No results for input(s): PROBNP in the last 8760 hours.  CBG: No results for input(s): GLUCAP in the last 168 hours.     Signed:  Toribio Hummer MD.  [facility] 12/23/2023, 11:59 AM       [1]  Allergies Allergen Reactions   Ibuprofen Other (See Comments)    Pt is on  Xarelto    Nsaids Other (See Comments)    Pt on Xarelto    Tolmetin Other (See Comments)    Pt on Xarelto    Zoster Vaccine Live Other (See Comments)    Serum sickness, high fever, neck pain    Tape Other (See Comments)    Causes blisters    Amoxicillin Rash   Lactose Intolerance (Gi) Diarrhea and Nausea And Vomiting   Lisinopril Cough   Penicillins Rash    Local reaction

## 2023-12-23 NOTE — Plan of Care (Signed)
  Problem: Health Behavior/Discharge Planning: Goal: Ability to manage health-related needs will improve Outcome: Progressing   Problem: Clinical Measurements: Goal: Will remain free from infection Outcome: Progressing Goal: Respiratory complications will improve Outcome: Progressing Goal: Cardiovascular complication will be avoided Outcome: Progressing   Problem: Activity: Goal: Risk for activity intolerance will decrease Outcome: Progressing   Problem: Nutrition: Goal: Adequate nutrition will be maintained Outcome: Progressing   Problem: Safety: Goal: Ability to remain free from injury will improve Outcome: Progressing

## 2023-12-23 NOTE — Progress Notes (Signed)
 DISCHARGE NOTE HOME Veronica Bradford to be discharged Home per MD order. Discussed prescriptions and follow up appointments with the patient. Prescriptions given to patient; medication list explained in detail. Patient verbalized understanding.  Extra Education on diet and addressed concerns of  low hemoglobin.  Patient verbalized understanding and will follow up with Dietary referral and PCP.  Skin clean, dry and intact without evidence of skin break down, no evidence of skin tears noted. IV catheter discontinued intact. Site without signs and symptoms of complications. Dressing and pressure applied. Pt denies pain at the site currently. No complaints noted.  Patient free of lines, drains, and wounds.   An After Visit Summary (AVS) was printed and given to the patient. Patient escorted via wheelchair, and discharged home via private auto.  Peyton SHAUNNA Pepper, RN

## 2023-12-23 NOTE — Progress Notes (Signed)
 OT Cancellation Note  Patient Details Name: Veronica Bradford MRN: [id number] DOB: November 06, 1945   Cancelled Treatment:    Reason Eval/Treat Not Completed: OT screened, no needs identified, will sign off (Discussed with PT)  Lamonta Cypress,HILLARY 12/23/2023, 10:56 AM Kreg Sink, OT/L   Acute OT Clinical Specialist Acute Rehabilitation Services Pager [phone] Office [phone]

## 2023-12-24 ENCOUNTER — Encounter (HOSPITAL_COMMUNITY): Payer: Self-pay | Admitting: Gastroenterology

## 2023-12-24 ENCOUNTER — Telehealth: Payer: Self-pay

## 2023-12-24 NOTE — Transitions of Care (Post Inpatient/ED Visit) (Signed)
" ° °  12/24/2023  Name: Veronica Bradford MRN: [id number] DOB: [birth year]  Today's TOC FU Call Status: Today's TOC FU Call Status:: Successful TOC FU Call Completed TOC FU Call Complete Date: 12/24/23  Patient's Name and Date of Birth confirmed. Name, DOB  Transition Care Management Follow-up Telephone Call Date of Discharge: 12/23/23 Discharge Facility: [facility]) Type of Discharge: Inpatient Admission Primary Inpatient Discharge Diagnosis:: Anemia How have you been since you were released from the hospital?: Better Any questions or concerns?: No  Items Reviewed: Did you receive and understand the discharge instructions provided?: Yes Medications obtained,verified, and reconciled?: Yes (Medications Reviewed) Any new allergies since your discharge?: No Dietary orders reviewed?: Yes Type of Diet Ordered:: Low Sodium Heart Healthy Do you have support at home?: Yes People in Home [RPT]: spouse Name of Support/Comfort Primary Source: Fiorella Hanahan  Medications Reviewed Today: Medications Reviewed Today   Medications were not reviewed in this encounter     Home Care and Equipment/Supplies: Were Home Health Services Ordered?: No Any new equipment or medical supplies ordered?: No  Functional Questionnaire: Do you need assistance with bathing/showering or dressing?: No Do you need assistance with meal preparation?: No Do you need assistance with eating?: No Do you have difficulty maintaining continence: No Do you need assistance with getting out of bed/getting out of a chair/moving?: No Do you have difficulty managing or taking your medications?: No  Follow up appointments reviewed: PCP Follow-up appointment confirmed?: Yes Date of PCP follow-up appointment?: 12/28/23 Follow-up Provider: Dr. Frann Specialist [facility] Follow-up appointment confirmed?: Yes Date of Specialist follow-up appointment?: 01/01/24 Follow-Up Specialty Provider:: Dr. Kennyth Do you need  transportation to your follow-up appointment?: No Do you understand care options if your condition(s) worsen?: Yes-patient verbalized understanding  SDOH Interventions Today    Flowsheet Row Most Recent Value  SDOH Interventions   Food Insecurity Interventions Intervention Not Indicated  Housing Interventions Intervention Not Indicated  Transportation Interventions Intervention Not Indicated  Utilities Interventions Intervention Not Indicated    [place] Balboa, BSN, RN [place]  VBCI - Population Health RN Care Manager [phone]  "

## 2023-12-28 ENCOUNTER — Encounter: Payer: Self-pay | Admitting: Family Medicine

## 2023-12-28 ENCOUNTER — Ambulatory Visit: Payer: Self-pay | Admitting: Family Medicine

## 2023-12-28 ENCOUNTER — Ambulatory Visit: Admitting: Family Medicine

## 2023-12-28 VITALS — BP 116/72 | HR 52 | Temp 97.7°F | Resp 16 | Ht 67.5 in | Wt 227.6 lb

## 2023-12-28 DIAGNOSIS — D62 Acute posthemorrhagic anemia: Secondary | ICD-10-CM

## 2023-12-28 LAB — LACTATE DEHYDROGENASE: LDH: 144 U/L (ref 120–250)

## 2023-12-28 LAB — CBC
HCT: 35.8 % — ABNORMAL LOW (ref 36.0–46.0)
Hemoglobin: 11.5 g/dL — ABNORMAL LOW (ref 12.0–15.0)
MCHC: 32.2 g/dL (ref 30.0–36.0)
MCV: 86 fl (ref 78.0–100.0)
Platelets: 279 K/uL (ref 150.0–400.0)
RBC: 4.16 Mil/uL (ref 3.87–5.11)
RDW: 18.2 % — ABNORMAL HIGH (ref 11.5–15.5)
WBC: 6.1 K/uL (ref 4.0–10.5)

## 2023-12-28 LAB — BASIC METABOLIC PANEL WITH GFR
BUN: 8 mg/dL (ref 6–23)
CO2: 26 meq/L (ref 19–32)
Calcium: 8.9 mg/dL (ref 8.4–10.5)
Chloride: 107 meq/L (ref 96–112)
Creatinine, Ser: 0.61 mg/dL (ref 0.40–1.20)
GFR: 87.08 mL/min
Glucose, Bld: 93 mg/dL (ref 70–99)
Potassium: 3.9 meq/L (ref 3.5–5.1)
Sodium: 141 meq/L (ref 135–145)

## 2023-12-28 LAB — B12 AND FOLATE PANEL
Folate: >23.7 ng/mL
Vitamin B-12: 1063 pg/mL — ABNORMAL HIGH (ref 211–911)

## 2023-12-28 NOTE — Progress Notes (Addendum)
 Chief Complaint  Patient presents with   Hospitalization Follow-up    Patient presents today for a hospital follow-up. She was admitted into [facility] hospital from 12/18/2023- 12/23/2023 for acute blood loss anemia. She reports SOB.    Subjective: Patient is a 78 y.o. female here for hospitalization follow-up.  Admitted 12/16-12/21 for acute blood loss/symptomatic anemia. DOE for past 1.5 mo. Was working with cards thinking it is related to that. Went to ER at their rec and Hb was 8.5. Received 2 u of PRBC's on 12/18.  Hemoglobin eventually came up to the 9-10 range.  She still having shortness of breath with exertion and mental fog.  No blood in her urine or stool.  FOBT was positive.  GI workup was unremarkable.  She has been taking iron.  She has been staying hydrated drinking around 100 ounces of water  daily.  Past Medical History:  Diagnosis Date   Allergy    Anemia 2021   Arthritis    Atrial fibrillation (HCC)    Atrial fibrillation (HCC)    Back pain    Blood transfusion without reported diagnosis    Cataract 2021   Removed   CHF (congestive heart failure) (HCC)    grade 2 HF   Chronic sinusitis    s/p 2 surgeries remotely, Dr Ethyl   Closed fracture of metatarsal of left foot    L foot, fifth metatarsal   Complication of anesthesia    woke up once during procedure   Dysrhythmia    a-fib   Ectopic pregnancy    Endometrial cancer (HCC) 2004   Early diagnosis   GERD (gastroesophageal reflux disease)    Heart murmur    Hyperlipemia    Hypertension    IBS (irritable bowel syndrome) 08/13/2014   Dx 2015, diarrhea on-off, Dr Donnald    Lumbar radiculopathy 08/2012   MRI in 08/2012   Mild hyperparathyroidism    Osteopenia    PAF (paroxysmal atrial fibrillation) (HCC)    Presence of permanent cardiac pacemaker    Sciatica of right side 08/2013   Received Depomedrol 80 mg injection   Sleep apnea 09/09/2013   Dx with OSA in 2015, by Dr. Fernande,   wears CPAPAPAP     Objective: BP 116/72   Pulse (!) 52   Temp 97.7 F (36.5 C)   Resp 16   Ht 5' 7.5 (1.715 m)   Wt 227 lb 9.6 oz (103.2 kg)   SpO2 95%   BMI 35.12 kg/m  General: Awake, appears stated age Heart: Reg rhythm, bradycardic, no LE edema Mouth: MMM, no subglossal icterus Skin: No jaundice Eyes: No scleral icterus Lungs: CTAB, no rales, wheezes or rhonchi. No accessory muscle use Psych: Age appropriate judgment and insight, normal affect and mood  Assessment and Plan: Acute blood loss anemia - Plan: CBC, Basic metabolic panel with GFR, B12 and Folate Panel, Lactate dehydrogenase, Urine Microscopic Only  Check above for thoroughness.  Hopefully she does not have to go back to the hospital.  Appreciate EP and GI input.  She has a follow-up appointment with each in the next 1 to 2 weeks.  Stay hydrated and continue supplementation.  Consider adding electrolytes to around 30 to 35 ounces of fluid daily.  Depending on results, may consider hematology referral.  Follow-up with her regular PCP as originally scheduled.  She will go to the ER if symptoms worsen. The patient voiced understanding and agreement to the plan.  I spent 30 minutes with the  patient discussing the above plan in addition to reviewing her chart/hospitalization on the same day of the visit.  Mabel Mt [place], DO 12/28/2023  11:40 AM

## 2023-12-28 NOTE — Patient Instructions (Signed)
 Give us  2-3 business days to get the results of your labs back.   Stay hydrated. Consider adding some electrolytes to 30-35 oz of your fluid intake.   Let us  know if you need anything.

## 2023-12-28 NOTE — Addendum Note (Signed)
 Addended by: TRUDY CURVIN RAMAN on: 12/28/2023 03:19 PM   Modules accepted: Orders

## 2023-12-29 LAB — URINALYSIS, MICROSCOPIC ONLY
Bacteria, UA: NONE SEEN /HPF
Hyaline Cast: NONE SEEN /LPF
RBC / HPF: NONE SEEN /HPF (ref 0–2)
Squamous Epithelial / HPF: NONE SEEN /HPF
WBC, UA: NONE SEEN /HPF (ref 0–5)

## 2023-12-30 ENCOUNTER — Ambulatory Visit: Payer: Self-pay | Admitting: Cardiology

## 2024-01-01 ENCOUNTER — Encounter: Payer: Self-pay | Admitting: Cardiology

## 2024-01-01 ENCOUNTER — Ambulatory Visit: Attending: Cardiology | Admitting: Cardiology

## 2024-01-01 VITALS — BP 122/78 | HR 66 | Ht 67.5 in | Wt 227.0 lb

## 2024-01-01 DIAGNOSIS — I495 Sick sinus syndrome: Secondary | ICD-10-CM

## 2024-01-01 DIAGNOSIS — I491 Atrial premature depolarization: Secondary | ICD-10-CM

## 2024-01-01 DIAGNOSIS — K922 Gastrointestinal hemorrhage, unspecified: Secondary | ICD-10-CM | POA: Diagnosis not present

## 2024-01-01 DIAGNOSIS — I483 Typical atrial flutter: Secondary | ICD-10-CM

## 2024-01-01 DIAGNOSIS — Z950 Presence of cardiac pacemaker: Secondary | ICD-10-CM | POA: Diagnosis not present

## 2024-01-01 DIAGNOSIS — D6869 Other thrombophilia: Secondary | ICD-10-CM | POA: Diagnosis not present

## 2024-01-01 DIAGNOSIS — I480 Paroxysmal atrial fibrillation: Secondary | ICD-10-CM

## 2024-01-01 LAB — CUP PACEART INCLINIC DEVICE CHECK
Battery Remaining Longevity: 96 mo
Battery Voltage: 3.01 V
Brady Statistic RA Percent Paced: 2 %
Brady Statistic RV Percent Paced: 21 %
Date Time Interrogation Session: 20251230151839
Implantable Lead Connection Status: 753985
Implantable Lead Connection Status: 753985
Implantable Lead Implant Date: 20220912
Implantable Lead Implant Date: 20220912
Implantable Lead Location: 753859
Implantable Lead Location: 753860
Implantable Lead Model: 3830
Implantable Pulse Generator Implant Date: 20220912
Lead Channel Impedance Value: 350 Ohm
Lead Channel Impedance Value: 612.5 Ohm
Lead Channel Pacing Threshold Amplitude: 1 V
Lead Channel Pacing Threshold Amplitude: 1 V
Lead Channel Pacing Threshold Pulse Width: 0.4 ms
Lead Channel Pacing Threshold Pulse Width: 0.4 ms
Lead Channel Sensing Intrinsic Amplitude: 0.2 mV
Lead Channel Sensing Intrinsic Amplitude: 11.8 mV
Lead Channel Setting Pacing Amplitude: 2 V
Lead Channel Setting Pacing Amplitude: 2.5 V
Lead Channel Setting Pacing Pulse Width: 0.4 ms
Lead Channel Setting Sensing Sensitivity: 2 mV
Pulse Gen Model: 2272
Pulse Gen Serial Number: 3956585

## 2024-01-01 MED ORDER — FLECAINIDE ACETATE 100 MG PO TABS: 100.0000 mg | ORAL_TABLET | Freq: Two times a day (BID) | ORAL | 3 refills | Status: AC

## 2024-01-01 NOTE — Progress Notes (Unsigned)
 " Electrophysiology Office Note:   Date:  01/01/2024  ID:  Veronica Bradford, DOB Dec 01, 1945, MRN [id number]  Primary Cardiologist: None Electrophysiologist: Fonda Kitty, MD      History of Present Illness:   Veronica Bradford is a 78 y.o. 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 Fernande.  Discussed the use of AI scribe software for clinical note transcription with the patient, who gave verbal consent to proceed.  History of Present Illness Veronica Bradford is a 78 year old female with atrial fibrillation who presents for follow-up after an ablation procedure on 08/01/2023.  She was found to have reconnection of the LSPV, LIPV, RSPV.  She has experienced significant improvement in her symptoms following the ablation procedure. Initially, she had atrial bigeminy and frequent premature atrial contractions (PACs) post-procedure, which were debilitating and comparable to atrial fibrillation, causing her to stop during walks. However, over the past three weeks, she has been able to walk 1.5 to 2 miles daily without issues, and the tiredness she experienced post-ablation has resolved.  She was on diltiazem  for the PACs, but its effectiveness is uncertain. She has not experienced significant PACs in the past three weeks and wants to discontinue diltiazem . She is currently on dofetilide  (Tikosyn ) for atrial fibrillation prevention.  She started a GLP-1 medication two months ago and returned to [employer] a month prior, resulting in a weight loss of 26 pounds. Her increased physical activity and weight loss have positively impacted her blood pressure, which has decreased significantly. She is currently taking a sixth of a 40 mg valsartan  tablet due to low blood pressure readings when taking the full dose.  She  reports one episode of atrial fibrillation lasting about two hours on September 17th, which she attributes to atrial undersensing. She notes that her heart rate increases significantly with minimal movement due to the pacemaker's accelerometer.  She has reduced her use of Lasix  to once a week, depending on ankle swelling, which has improved. Her current medications include valsartan , spironolactone , and dofetilide .   Review of systems complete and found to be negative unless listed in HPI.   EP Information / Studies Reviewed:    EKG is not ordered today. EKG from 03/27/23 reviewed which showed atrial paced rhythm.      Echo 02/11/2021: 1. Left ventricular ejection fraction, by estimation, is 65 to 70%. The  left ventricle has normal function. The left ventricle has no regional  wall motion abnormalities. There is moderate asymmetric left ventricular  hypertrophy of the basal-septal  segment. Left ventricular diastolic parameters are indeterminate.   2. Right ventricular systolic function is normal. The right ventricular  size is mildly enlarged. There is normal pulmonary artery systolic  pressure. The estimated right ventricular systolic pressure is 30.2 mmHg.   3. The mitral valve is normal in structure. Trivial mitral valve  regurgitation. No evidence of mitral stenosis.   4. The aortic valve is tricuspid. Aortic valve regurgitation is not  visualized. No aortic stenosis is present.   5. The inferior vena cava is normal in size with greater than 50%  respiratory variability, suggesting right atrial pressure of 3 mmHg.   Risk Assessment/Calculations:    CHA2DS2-VASc Score = 6   This indicates a 9.7% annual risk of stroke. The patient's score is based upon: CHF History: 1 HTN  History: 1 Diabetes History: 0 Stroke History: 0 Vascular Disease History: 1 Age Score: 2 Gender Score: 1   {This patient has a significant risk of stroke if diagnosed with atrial fibrillation.  Please  consider VKA or DOAC agent for anticoagulation if the bleeding risk is acceptable.   You can also use the SmartPhrase .[email or web address] for documentation.   :[id number]}      Physical Exam:   VS:  There were no vitals taken for this visit.   Wt Readings from Last 3 Encounters:  12/28/23 227 lb 9.6 oz (103.2 kg)  12/24/23 226 lb (102.5 kg)  12/19/23 226 lb 11.2 oz (102.8 kg)     GEN: Well nourished, well developed in no acute distress NECK: No JVD; No carotid bruits CARDIAC: Normal rate, regular. Well healed left chest pacer pocket. RESPIRATORY:  Clear to auscultation without rales, wheezing or rhonchi  ABDOMEN: Soft, non-tender, non-distended EXTREMITIES: No edema; No deformity   ASSESSMENT AND PLAN:    Assessment & Plan # Paroxysmal Atrial Fibrillation: Status post PVI in 2019. Redo ablation on 08/01/2023.  She was found to have reconnection of the LSPV, LIPV, RSPV.  All pulmonary veins were isolated and posterior wall was isolated. # Typical atrial flutter: Status post CTI ablation in 2019.  CTI remained blocked on redo ablation 08/01/2023. # Frequent PACs # High risk medication use: Dofetilide . Qtc stable on last ECG. # Tachycardia-bradycardia syndrome status post pacemaker - We had a lengthy discussion regarding options for PAC suppression, which appears to be her most bothersome symptom at this time.  She had previously been on flecainide .  This may be more successful at suppressing ectopy then her dofetilide .  We discussed stopping dofetilide  and retrialing flecainide  now that she has had a redo ablation for her atrial fibrillation.  She does have long PR and right bundle branch block but has pacemaker in place.  We will stop dofetilide  and wait 48 hours, then restart flecainide  100 mg twice daily.  She will return to clinic 1 week later for ECG.  We may need to reprogram her device to shorten AV delays, which may result in ventricular pacing but she does have a left bundle branch area  pacing lead.  We also discussed that a dedicated catheter ablation for PAC could be performed if burden is high enough. - Continue metoprolol  XL 50 mg twice daily. 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 -Continue Xarelto  20 mg once daily.  #Hypertension - Blood pressures have significantly improved since losing weight.  She will remain off valsartan .  We will also stop spironolactone  for right now and she will continue to monitor her blood pressures.  If blood pressures become elevated then she will resume spironolactone  12.5 mg once daily.  These changes should allow for her to tolerate full doses of metoprolol .   Follow up with EP APP in 12 months.  Signed, Fonda Kitty, MD   "

## 2024-01-01 NOTE — Patient Instructions (Signed)
 Medication Instructions:  Your physician has recommended you make the following change in your medication:  1) ON January 12th - resume flecainide  at 100 mg twice daily   *If you need a refill on your cardiac medications before your next appointment, please call your pharmacy*  Lab Work: TODAY: BMET and CBC  Testing/Procedures: Cardioversion Your physician has recommended that you have a Cardioversion (DCCV). Electrical Cardioversion uses a jolt of electricity to your heart either through paddles or wired patches attached to your chest. This is a controlled, usually prescheduled, procedure. Defibrillation is done under light anesthesia in the hospital, and you usually go home the day of the procedure. This is done to get your heart back into a normal rhythm. You are not awake for the procedure. Please see the instruction sheet given to you today.  Follow-Up: At [facility], you and your health needs are our priority.  As part of our continuing mission to provide you with exceptional heart care, our providers are all part of one team.  This team includes your primary Cardiologist (physician) and Advanced Practice Providers or APPs (Physician Assistants and Nurse Practitioners) who all work together to provide you with the care you need, when you need it.  Your next appointment:   1 month after Cardioversion  Provider:   Fonda Kitty, MD

## 2024-01-04 ENCOUNTER — Encounter: Payer: Self-pay | Admitting: Internal Medicine

## 2024-01-06 ENCOUNTER — Ambulatory Visit: Payer: Self-pay | Admitting: Cardiology

## 2024-01-07 ENCOUNTER — Telehealth: Payer: Self-pay

## 2024-01-07 ENCOUNTER — Other Ambulatory Visit (HOSPITAL_COMMUNITY): Payer: Self-pay

## 2024-01-07 NOTE — Telephone Encounter (Signed)
 Pharmacy Patient Advocate Encounter   Received notification from Patient Advice Request messages that prior authorization for Zepbound  7.5mg /0.56ml is required/requested.   Insurance verification completed.   The patient is insured through [facility].   Per test claim: PA required; PA submitted to above mentioned insurance via Latent Key/confirmation #/[employer]OC [employer] Status is pending

## 2024-01-07 NOTE — Telephone Encounter (Signed)
 Pharmacy Patient Advocate Encounter  Received notification from [employer] that Prior Authorization for Zepbound  7.5mg /0.85ml has been DENIED.  See denial reason below. No denial letter attached in CMM. Will attach denial letter to Media tab once received.   PA #/Case ID/Reference #: [id number]

## 2024-01-09 ENCOUNTER — Telehealth: Payer: Self-pay | Admitting: Pharmacist

## 2024-01-09 ENCOUNTER — Encounter: Payer: Self-pay | Admitting: Cardiology

## 2024-01-09 NOTE — Telephone Encounter (Signed)
 Appeal has been submitted. Will advise when response is received, please be advised that most companies may take 30 days to make a decision. Appeal letter and supporting clinical documentation have been faxed to [phone] on 01/09/2024 @10 :10 am.  Thank you, Devere Pandy, PharmD Clinical Pharmacist  [place]  Direct Dial: [phone]

## 2024-01-10 ENCOUNTER — Telehealth: Payer: Self-pay

## 2024-01-10 DIAGNOSIS — D649 Anemia, unspecified: Secondary | ICD-10-CM

## 2024-01-10 NOTE — Telephone Encounter (Signed)
 Copied from CRM #[id number]. Topic: Referral - Question >> Jan 10, 2024 11:20 AM Carlyon D wrote: Reason for CRM: Veronica Bradford with [facility] is calling stating per insurance is now requiring a  referral through [employer] care portal.  Also has to have provider and diagnostics code on referral. See below   Provider-Jaquelyn thigpin Diagnostic Code  -D64.9

## 2024-01-10 NOTE — Telephone Encounter (Unsigned)
 Copied from CRM #[id number]. Topic: Referral - Status >> Jan 10, 2024  2:03 PM Jasmin G wrote: Reason for CRM: Kaylyn with [facility] called to request for referral to be sent through [employer] portal as it is required by them.

## 2024-01-10 NOTE — Telephone Encounter (Signed)
 Referral placed.

## 2024-01-14 ENCOUNTER — Other Ambulatory Visit (HOSPITAL_COMMUNITY): Payer: Self-pay

## 2024-01-14 NOTE — Telephone Encounter (Signed)
 Appeal for Zepbound  has been approved by insurance through 01/01/2025.

## 2024-01-16 ENCOUNTER — Encounter (HOSPITAL_COMMUNITY): Admission: RE | Disposition: A | Discharge status: Home / Self Care | Payer: Self-pay | Source: Home / Self Care

## 2024-01-16 ENCOUNTER — Ambulatory Visit (HOSPITAL_COMMUNITY): Admission: RE | Admit: 2024-01-16 | Discharge: 2024-01-16 | Disposition: A | Discharge status: Home / Self Care

## 2024-01-16 ENCOUNTER — Encounter (HOSPITAL_COMMUNITY): Payer: Self-pay

## 2024-01-16 ENCOUNTER — Other Ambulatory Visit: Payer: Self-pay

## 2024-01-16 ENCOUNTER — Ambulatory Visit (HOSPITAL_COMMUNITY): Admitting: Anesthesiology

## 2024-01-16 DIAGNOSIS — I441 Atrioventricular block, second degree: Secondary | ICD-10-CM | POA: Diagnosis not present

## 2024-01-16 DIAGNOSIS — Z87891 Personal history of nicotine dependence: Secondary | ICD-10-CM

## 2024-01-16 DIAGNOSIS — I5032 Chronic diastolic (congestive) heart failure: Secondary | ICD-10-CM

## 2024-01-16 DIAGNOSIS — Z1152 Encounter for screening for COVID-19: Secondary | ICD-10-CM | POA: Diagnosis not present

## 2024-01-16 DIAGNOSIS — Z950 Presence of cardiac pacemaker: Secondary | ICD-10-CM | POA: Insufficient documentation

## 2024-01-16 DIAGNOSIS — I4892 Unspecified atrial flutter: Secondary | ICD-10-CM | POA: Insufficient documentation

## 2024-01-16 DIAGNOSIS — I480 Paroxysmal atrial fibrillation: Secondary | ICD-10-CM | POA: Insufficient documentation

## 2024-01-16 DIAGNOSIS — I493 Ventricular premature depolarization: Secondary | ICD-10-CM | POA: Insufficient documentation

## 2024-01-16 DIAGNOSIS — Z7901 Long term (current) use of anticoagulants: Secondary | ICD-10-CM | POA: Diagnosis not present

## 2024-01-16 DIAGNOSIS — I4819 Other persistent atrial fibrillation: Secondary | ICD-10-CM | POA: Diagnosis not present

## 2024-01-16 DIAGNOSIS — G43909 Migraine, unspecified, not intractable, without status migrainosus: Secondary | ICD-10-CM | POA: Diagnosis not present

## 2024-01-16 DIAGNOSIS — E785 Hyperlipidemia, unspecified: Secondary | ICD-10-CM | POA: Diagnosis not present

## 2024-01-16 DIAGNOSIS — I4891 Unspecified atrial fibrillation: Secondary | ICD-10-CM

## 2024-01-16 DIAGNOSIS — I110 Hypertensive heart disease with heart failure: Secondary | ICD-10-CM | POA: Diagnosis not present

## 2024-01-16 DIAGNOSIS — E669 Obesity, unspecified: Secondary | ICD-10-CM | POA: Diagnosis not present

## 2024-01-16 DIAGNOSIS — I5033 Acute on chronic diastolic (congestive) heart failure: Secondary | ICD-10-CM | POA: Diagnosis not present

## 2024-01-16 DIAGNOSIS — I495 Sick sinus syndrome: Secondary | ICD-10-CM | POA: Insufficient documentation

## 2024-01-16 DIAGNOSIS — D509 Iron deficiency anemia, unspecified: Secondary | ICD-10-CM | POA: Diagnosis not present

## 2024-01-16 DIAGNOSIS — D6869 Other thrombophilia: Secondary | ICD-10-CM | POA: Insufficient documentation

## 2024-01-16 DIAGNOSIS — Z01818 Encounter for other preprocedural examination: Secondary | ICD-10-CM

## 2024-01-16 HISTORY — PX: CARDIOVERSION: EP1203

## 2024-01-16 MED ORDER — SODIUM CHLORIDE 0.9 % IV SOLN: INTRAVENOUS | Status: DC

## 2024-01-16 MED ORDER — PROPOFOL 10 MG/ML IV BOLUS
INTRAVENOUS | Status: DC | PRN
  Administered 2024-01-16: 60 mg via INTRAVENOUS

## 2024-01-16 MED ORDER — LIDOCAINE 2% (20 MG/ML) 5 ML SYRINGE
INTRAMUSCULAR | Status: DC | PRN
  Administered 2024-01-16: 50 mg via INTRAVENOUS

## 2024-01-16 NOTE — Transfer of Care (Signed)
 Immediate Anesthesia Transfer of Care Note  Patient: Veronica Bradford  Procedure(s) Performed: CARDIOVERSION  Patient Location: Cath Lab  Anesthesia Type:General  Level of Consciousness: drowsy  Airway & Oxygen Therapy: Patient Spontanous Breathing and Patient connected to nasal cannula oxygen  Post-op Assessment: Report given to RN and Post -op Vital signs reviewed and stable  Post vital signs: Reviewed and stable  Last Vitals:  Vitals Value Taken Time  BP    Temp    Pulse    Resp    SpO2      Last Pain:  Vitals:   01/16/24 0914  TempSrc: Tympanic  PainSc: 0-No pain         Complications: No notable events documented.

## 2024-01-16 NOTE — Anesthesia Postprocedure Evaluation (Signed)
"   Anesthesia Post Note  Patient: Veronica Bradford  Procedure(s) Performed: CARDIOVERSION     Patient location during evaluation: Cath Lab Anesthesia Type: General Level of consciousness: awake and alert Pain management: pain level controlled Vital Signs Assessment: post-procedure vital signs reviewed and stable Respiratory status: spontaneous breathing, nonlabored ventilation and respiratory function stable Cardiovascular status: blood pressure returned to baseline and stable Postop Assessment: no apparent nausea or vomiting Anesthetic complications: no   No notable events documented.  Last Vitals:  Vitals:   01/16/24 1000 01/16/24 1010  BP: 109/60 (!) 104/55  Pulse: 70 70  Resp: 16 (!) 9  Temp:    SpO2: 98% 99%    Last Pain:  Vitals:   01/16/24 1010  TempSrc:   PainSc: 0-No pain                 Sanvi Ehler      "

## 2024-01-16 NOTE — CV Procedure (Signed)
" ° °  DIRECT CURRENT CARDIOVERSION  NAME:  Veronica Bradford    MRN: [id number] DOB:  [birth year]    ADMIT DATE: 01/16/2024  Indication:  Symptomatic atrial fibrillation  Procedure Note:  The patient signed informed consent.  They have had had therapeutic anticoagulation with greater than 3 weeks.  Anesthesia was administered by Dr. Leopoldo.  Adequate airway was maintained throughout and vital followed per protocol.  They were cardioverted x 1 with 200J of biphasic synchronized energy.  They converted to A-paced V-paced at 70.  There were no apparent complications.  The patient had normal neuro status and respiratory status post procedure with vitals stable as recorded elsewhere.    Follow up: They will continue on current medical therapy and follow up with cardiology as scheduled.  Joelle DEL. Ren Ny, MD, [facility]  [facility]  9:35 AM  "

## 2024-01-16 NOTE — Anesthesia Preprocedure Evaluation (Addendum)
"                                    Anesthesia Evaluation  Patient identified by MRN, date of birth, ID band Patient awake    Reviewed: Allergy & Precautions, H&P , NPO status , Patient's Chart, lab work & pertinent test results, reviewed documented beta blocker date and time   History of Anesthesia Complications (+) history of anesthetic complications  Airway Mallampati: II  TM Distance: >3 FB Neck ROM: Full    Dental  (+) Teeth Intact, Dental Advisory Given   Pulmonary sleep apnea and Continuous Positive Airway Pressure Ventilation , neg COPD, former smoker   breath sounds clear to auscultation       Cardiovascular Exercise Tolerance: Good hypertension, Pt. on medications and Pt. on home beta blockers + Past MI, +CHF and + DOE  (-) CAD and (-) Cardiac Stents + dysrhythmias Atrial Fibrillation + pacemaker  Rhythm:Irregular  1. Left ventricular ejection fraction, by estimation, is 65 to 70%. The  left ventricle has normal function. The left ventricle has no regional  wall motion abnormalities. There is moderate asymmetric left ventricular  hypertrophy of the basal-septal  segment. Left ventricular diastolic parameters are indeterminate.   2. Right ventricular systolic function is normal. The right ventricular  size is mildly enlarged. There is normal pulmonary artery systolic  pressure. The estimated right ventricular systolic pressure is 30.2 mmHg.   3. The mitral valve is normal in structure. Trivial mitral valve  regurgitation. No evidence of mitral stenosis.   4. The aortic valve is tricuspid. Aortic valve regurgitation is not  visualized. No aortic stenosis is present.   5. The inferior vena cava is normal in size with greater than 50%  respiratory variability, suggesting right atrial pressure of 3 mmHg.      Neuro/Psych neg Seizures  Neuromuscular disease negative neurological ROS  negative psych ROS   GI/Hepatic Neg liver ROS,GERD  Medicated,,(+) neg  Cirrhosis        Endo/Other  negative endocrine ROS    Renal/GU Renal diseasenegative Renal ROS  negative genitourinary   Musculoskeletal   Abdominal   Peds  Hematology  (+) Blood dyscrasia, anemia   Anesthesia Other Findings   Reproductive/Obstetrics negative OB ROS                              Anesthesia Physical Anesthesia Plan  ASA: 3  Anesthesia Plan: General   Post-op Pain Management: Minimal or no pain anticipated   Induction: Intravenous  PONV Risk Score and Plan: 3 and Treatment may vary due to age or medical condition  Airway Management Planned: Natural Airway, Simple Face Mask and Nasal Cannula  Additional Equipment: None  Intra-op Plan:   Post-operative Plan:   Informed Consent: I have reviewed the patients History and Physical, chart, labs and discussed the procedure including the risks, benefits and alternatives for the proposed anesthesia with the patient or authorized representative who has indicated his/her understanding and acceptance.     Dental advisory given  Plan Discussed with: CRNA  Anesthesia Plan Comments:          Anesthesia Quick Evaluation  "

## 2024-01-17 ENCOUNTER — Encounter: Payer: Self-pay | Admitting: Cardiology

## 2024-01-17 NOTE — Telephone Encounter (Signed)
 Good Afternoon!   I do not see of any changes that were made regarding patient PPM.   If you need anything else, please let us  know!  Thanks!

## 2024-01-18 ENCOUNTER — Emergency Department (HOSPITAL_BASED_OUTPATIENT_CLINIC_OR_DEPARTMENT_OTHER)

## 2024-01-18 ENCOUNTER — Telehealth: Payer: Self-pay

## 2024-01-18 ENCOUNTER — Inpatient Hospital Stay (HOSPITAL_BASED_OUTPATIENT_CLINIC_OR_DEPARTMENT_OTHER)
Admission: EM | Admit: 2024-01-18 | Discharge: 2024-01-21 | DRG: 291 | Disposition: A | Discharge status: Home / Self Care | Attending: Internal Medicine | Admitting: Internal Medicine

## 2024-01-18 ENCOUNTER — Ambulatory Visit: Payer: Self-pay

## 2024-01-18 ENCOUNTER — Other Ambulatory Visit: Payer: Self-pay

## 2024-01-18 ENCOUNTER — Encounter (HOSPITAL_BASED_OUTPATIENT_CLINIC_OR_DEPARTMENT_OTHER): Payer: Self-pay | Admitting: Emergency Medicine

## 2024-01-18 DIAGNOSIS — Z888 Allergy status to other drugs, medicaments and biological substances status: Secondary | ICD-10-CM

## 2024-01-18 DIAGNOSIS — E739 Lactose intolerance, unspecified: Secondary | ICD-10-CM | POA: Diagnosis present

## 2024-01-18 DIAGNOSIS — Z880 Allergy status to penicillin: Secondary | ICD-10-CM

## 2024-01-18 DIAGNOSIS — Z91048 Other nonmedicinal substance allergy status: Secondary | ICD-10-CM

## 2024-01-18 DIAGNOSIS — I10 Essential (primary) hypertension: Secondary | ICD-10-CM | POA: Diagnosis present

## 2024-01-18 DIAGNOSIS — M8580 Other specified disorders of bone density and structure, unspecified site: Secondary | ICD-10-CM | POA: Diagnosis present

## 2024-01-18 DIAGNOSIS — D649 Anemia, unspecified: Secondary | ICD-10-CM | POA: Diagnosis present

## 2024-01-18 DIAGNOSIS — Z7985 Long-term (current) use of injectable non-insulin antidiabetic drugs: Secondary | ICD-10-CM

## 2024-01-18 DIAGNOSIS — Z821 Family history of blindness and visual loss: Secondary | ICD-10-CM

## 2024-01-18 DIAGNOSIS — Z8041 Family history of malignant neoplasm of ovary: Secondary | ICD-10-CM

## 2024-01-18 DIAGNOSIS — I110 Hypertensive heart disease with heart failure: Principal | ICD-10-CM | POA: Diagnosis present

## 2024-01-18 DIAGNOSIS — Z7901 Long term (current) use of anticoagulants: Secondary | ICD-10-CM

## 2024-01-18 DIAGNOSIS — Z950 Presence of cardiac pacemaker: Secondary | ICD-10-CM

## 2024-01-18 DIAGNOSIS — I493 Ventricular premature depolarization: Secondary | ICD-10-CM | POA: Diagnosis present

## 2024-01-18 DIAGNOSIS — G4733 Obstructive sleep apnea (adult) (pediatric): Secondary | ICD-10-CM | POA: Diagnosis present

## 2024-01-18 DIAGNOSIS — R519 Headache, unspecified: Secondary | ICD-10-CM

## 2024-01-18 DIAGNOSIS — K219 Gastro-esophageal reflux disease without esophagitis: Secondary | ICD-10-CM | POA: Diagnosis present

## 2024-01-18 DIAGNOSIS — Z822 Family history of deafness and hearing loss: Secondary | ICD-10-CM

## 2024-01-18 DIAGNOSIS — R911 Solitary pulmonary nodule: Secondary | ICD-10-CM

## 2024-01-18 DIAGNOSIS — Z90710 Acquired absence of both cervix and uterus: Secondary | ICD-10-CM

## 2024-01-18 DIAGNOSIS — Z96642 Presence of left artificial hip joint: Secondary | ICD-10-CM | POA: Diagnosis present

## 2024-01-18 DIAGNOSIS — K649 Unspecified hemorrhoids: Secondary | ICD-10-CM | POA: Diagnosis present

## 2024-01-18 DIAGNOSIS — Z886 Allergy status to analgesic agent status: Secondary | ICD-10-CM

## 2024-01-18 DIAGNOSIS — Z808 Family history of malignant neoplasm of other organs or systems: Secondary | ICD-10-CM

## 2024-01-18 DIAGNOSIS — Z803 Family history of malignant neoplasm of breast: Secondary | ICD-10-CM

## 2024-01-18 DIAGNOSIS — Z8542 Personal history of malignant neoplasm of other parts of uterus: Secondary | ICD-10-CM

## 2024-01-18 DIAGNOSIS — G43909 Migraine, unspecified, not intractable, without status migrainosus: Secondary | ICD-10-CM | POA: Diagnosis present

## 2024-01-18 DIAGNOSIS — K589 Irritable bowel syndrome without diarrhea: Secondary | ICD-10-CM | POA: Diagnosis present

## 2024-01-18 DIAGNOSIS — Z79899 Other long term (current) drug therapy: Secondary | ICD-10-CM

## 2024-01-18 DIAGNOSIS — R29810 Facial weakness: Secondary | ICD-10-CM | POA: Diagnosis present

## 2024-01-18 DIAGNOSIS — I495 Sick sinus syndrome: Secondary | ICD-10-CM | POA: Diagnosis present

## 2024-01-18 DIAGNOSIS — Z811 Family history of alcohol abuse and dependence: Secondary | ICD-10-CM

## 2024-01-18 DIAGNOSIS — I4819 Other persistent atrial fibrillation: Secondary | ICD-10-CM | POA: Diagnosis present

## 2024-01-18 DIAGNOSIS — E785 Hyperlipidemia, unspecified: Secondary | ICD-10-CM | POA: Diagnosis present

## 2024-01-18 DIAGNOSIS — R0609 Other forms of dyspnea: Secondary | ICD-10-CM | POA: Diagnosis present

## 2024-01-18 DIAGNOSIS — I5033 Acute on chronic diastolic (congestive) heart failure: Secondary | ICD-10-CM | POA: Diagnosis present

## 2024-01-18 DIAGNOSIS — I441 Atrioventricular block, second degree: Secondary | ICD-10-CM | POA: Diagnosis present

## 2024-01-18 DIAGNOSIS — Z8249 Family history of ischemic heart disease and other diseases of the circulatory system: Secondary | ICD-10-CM

## 2024-01-18 DIAGNOSIS — Z6833 Body mass index (BMI) 33.0-33.9, adult: Secondary | ICD-10-CM

## 2024-01-18 DIAGNOSIS — Z800 Family history of malignant neoplasm of digestive organs: Secondary | ICD-10-CM

## 2024-01-18 DIAGNOSIS — Z96651 Presence of right artificial knee joint: Secondary | ICD-10-CM | POA: Diagnosis present

## 2024-01-18 DIAGNOSIS — I4892 Unspecified atrial flutter: Secondary | ICD-10-CM | POA: Diagnosis present

## 2024-01-18 DIAGNOSIS — Z810 Family history of intellectual disabilities: Secondary | ICD-10-CM

## 2024-01-18 DIAGNOSIS — R0600 Dyspnea, unspecified: Principal | ICD-10-CM

## 2024-01-18 DIAGNOSIS — D509 Iron deficiency anemia, unspecified: Secondary | ICD-10-CM | POA: Diagnosis present

## 2024-01-18 DIAGNOSIS — I4510 Unspecified right bundle-branch block: Secondary | ICD-10-CM | POA: Diagnosis present

## 2024-01-18 DIAGNOSIS — I480 Paroxysmal atrial fibrillation: Secondary | ICD-10-CM | POA: Diagnosis present

## 2024-01-18 DIAGNOSIS — Z87891 Personal history of nicotine dependence: Secondary | ICD-10-CM

## 2024-01-18 DIAGNOSIS — E669 Obesity, unspecified: Secondary | ICD-10-CM | POA: Diagnosis present

## 2024-01-18 DIAGNOSIS — Z833 Family history of diabetes mellitus: Secondary | ICD-10-CM

## 2024-01-18 DIAGNOSIS — Z1152 Encounter for screening for COVID-19: Secondary | ICD-10-CM

## 2024-01-18 LAB — COMPREHENSIVE METABOLIC PANEL WITH GFR
ALT: 61 U/L — ABNORMAL HIGH (ref 0–44)
AST: 65 U/L — ABNORMAL HIGH (ref 15–41)
Albumin: 3.9 g/dL (ref 3.5–5.0)
Alkaline Phosphatase: 91 U/L (ref 38–126)
Anion gap: 10 (ref 5–15)
BUN: 14 mg/dL (ref 8–23)
CO2: 22 mmol/L (ref 22–32)
Calcium: 9.1 mg/dL (ref 8.9–10.3)
Chloride: 105 mmol/L (ref 98–111)
Creatinine, Ser: 0.55 mg/dL (ref 0.44–1.00)
GFR, Estimated: >60 mL/min
Glucose, Bld: 116 mg/dL — ABNORMAL HIGH (ref 70–99)
Potassium: 4.1 mmol/L (ref 3.5–5.1)
Sodium: 137 mmol/L (ref 135–145)
Total Bilirubin: 0.7 mg/dL (ref 0.0–1.2)
Total Protein: 6.3 g/dL — ABNORMAL LOW (ref 6.5–8.1)

## 2024-01-18 LAB — CBC
HCT: 35.1 % — ABNORMAL LOW (ref 36.0–46.0)
Hemoglobin: 11.5 g/dL — ABNORMAL LOW (ref 12.0–15.0)
MCH: 28.6 pg (ref 26.0–34.0)
MCHC: 32.8 g/dL (ref 30.0–36.0)
MCV: 87.3 fL (ref 80.0–100.0)
Platelets: 223 K/uL (ref 150–400)
RBC: 4.02 MIL/uL (ref 3.87–5.11)
RDW: 18.8 % — ABNORMAL HIGH (ref 11.5–15.5)
WBC: 10.6 K/uL — ABNORMAL HIGH (ref 4.0–10.5)
nRBC: 0 % (ref 0.0–0.2)

## 2024-01-18 LAB — D-DIMER, QUANTITATIVE: D-Dimer, Quant: 3.06 ug{FEU}/mL — ABNORMAL HIGH (ref 0.00–0.50)

## 2024-01-18 LAB — RESP PANEL BY RT-PCR (RSV, FLU A&B, COVID)  RVPGX2
Influenza A by PCR: NEGATIVE
Influenza B by PCR: NEGATIVE
Resp Syncytial Virus by PCR: NEGATIVE
SARS Coronavirus 2 by RT PCR: NEGATIVE

## 2024-01-18 LAB — TROPONIN T, HIGH SENSITIVITY
Troponin T High Sensitivity: <15 ng/L (ref 0–19)
Troponin T High Sensitivity: 17 ng/L (ref 0–19)

## 2024-01-18 LAB — PRO BRAIN NATRIURETIC PEPTIDE: Pro Brain Natriuretic Peptide: 1768 pg/mL — ABNORMAL HIGH

## 2024-01-18 MED ORDER — FUROSEMIDE 10 MG/ML IJ SOLN
40.0000 mg | Freq: Once | INTRAMUSCULAR | Status: AC
  Administered 2024-01-18: 40 mg via INTRAVENOUS
  Filled 2024-01-18: qty 4

## 2024-01-18 MED ORDER — FLECAINIDE ACETATE 50 MG PO TABS
100.0000 mg | ORAL_TABLET | Freq: Two times a day (BID) | ORAL | Status: DC
  Administered 2024-01-18 – 2024-01-21 (×6): 100 mg via ORAL
  Filled 2024-01-18 (×4): qty 2

## 2024-01-18 MED ORDER — SODIUM CHLORIDE 0.9 % IV BOLUS
500.0000 mL | Freq: Once | INTRAVENOUS | Status: AC
  Administered 2024-01-18: 500 mL via INTRAVENOUS

## 2024-01-18 MED ORDER — APIXABAN 5 MG PO TABS
5.0000 mg | ORAL_TABLET | Freq: Two times a day (BID) | ORAL | Status: DC
  Administered 2024-01-18 – 2024-01-21 (×6): 5 mg via ORAL
  Filled 2024-01-18 (×5): qty 1

## 2024-01-18 MED ORDER — METOCLOPRAMIDE HCL 5 MG/ML IJ SOLN
10.0000 mg | Freq: Once | INTRAMUSCULAR | Status: AC
  Administered 2024-01-18: 10 mg via INTRAVENOUS
  Filled 2024-01-18: qty 2

## 2024-01-18 MED ORDER — IOHEXOL 350 MG/ML SOLN
75.0000 mL | Freq: Once | INTRAVENOUS | Status: AC | PRN
  Administered 2024-01-18: 75 mL via INTRAVENOUS

## 2024-01-18 MED ORDER — METOPROLOL SUCCINATE ER 25 MG PO TB24
25.0000 mg | ORAL_TABLET | Freq: Every day | ORAL | Status: DC
  Administered 2024-01-19: 25 mg via ORAL

## 2024-01-18 MED ORDER — METOPROLOL SUCCINATE ER 25 MG PO TB24: 50.0000 mg | ORAL_TABLET | Freq: Every day | ORAL | Status: DC

## 2024-01-18 MED ORDER — PROCHLORPERAZINE EDISYLATE 10 MG/2ML IJ SOLN: 10.0000 mg | Freq: Once | INTRAMUSCULAR | Status: DC

## 2024-01-18 MED ORDER — ACETAMINOPHEN 325 MG PO TABS
650.0000 mg | ORAL_TABLET | Freq: Once | ORAL | Status: AC
  Administered 2024-01-18: 650 mg via ORAL
  Filled 2024-01-18: qty 2

## 2024-01-18 NOTE — Telephone Encounter (Signed)
 Spoke with patient regarding her headaches and her symptoms after her cardioversion (see previous notes regarding this issue)  Patient was recently started on Flecainide  and cardioverted   Flecainide  has headaches as a listed side effect  Question if patient having poor reaction to medication?  Patient stated that changes were made to her pacemaker settings by device rep Bryan before/after procedure  This RN told patient he would call the [employer] rep and call her back  Spoke with [employer] rep Dorise regarding changes made to patient's device programming during or after cardioversion was performed and he told this RN the changes they made. However, he was unsure if the report made it to be scanned into patient records or not  Called patient back to request a manual transmission from patient to review for any possible anomalies and also see all changes made to programming by device rep  Will follow up with patient once transmission reviewed

## 2024-01-18 NOTE — ED Triage Notes (Signed)
 Pt reports cardioversion Wednesday; c/o headache since Wednesday. C/o shob today; chest pain with breathing; Spouse reports R facial droop today appx  1845.  No droop appreciated in triage, no sensation deficits, no drift.   Also R throat discomfort.    Recent blood transfusion 12/18/2023.

## 2024-01-18 NOTE — ED Notes (Signed)
 Pt transported to CT ?

## 2024-01-18 NOTE — Telephone Encounter (Signed)
 FYI. Pt refused ED.

## 2024-01-18 NOTE — Telephone Encounter (Signed)
 FYI Only or Action Required?: FYI only for provider: ED advised.  Patient was last seen in primary care on 12/28/2023 by Frann Mabel Mt, DO.  Called Nurse Triage reporting Headache.  Symptoms began several days ago.  Interventions attempted: OTC medications: Tylenol , Rest, hydration, or home remedies, Ice/heat application, and Dietary changes.  Symptoms are: unchanged.  Triage Disposition: Go to ED Now (or PCP Triage)  Patient/caregiver understands and will follow disposition?: Unsure  Message from [place] E sent at 01/18/2024  4:28 PM EST  Summary: Headache   Reason for Triage: Headache since patient had cardioversion 2 days ago, headache will not go away. With medication she rated 5 out of 10 for pain, without medication it is 7 out of 10.         Reason for Disposition  Sounds like a serious complication to the triager  Answer Assessment - Initial Assessment Questions Headache started mild after cardioversion and has gotten worse since. Is on blood thinners for 3 weeks and thought HA was from sedation or medication given. Pt also has visual merchandiser. Has taken Tylenol  and 1/2 of Tylenol  #3 and not gotten relief. Has tried caffeine, applied heat/ice. Has spoke with Cardiology and unable to be seen quickly. ED advised but pt refused, states she has been previously for true emergency needing blood transfusion and still waiting over 13 hours.   1. SYMPTOM: What's the main symptom you're concerned about? (e.g., pain, fever, vomiting)     Headache that won't go away 2. ONSET: When did sx  start?     2 days 3. SURGERY: What surgery did you have?     Cardioversion  4. DATE of SURGERY: When was the surgery?      2 days 01/16/24 5. ANESTHESIA: What type of anesthesia did you have? (e.g., general, spinal, epidural, local)     general 7. PAIN: Is there any pain? If Yes, ask: How bad is it?  (Scale 0-10; or none, mild, moderate, severe)     Pain 6-7/10 11. OTHER  SYMPTOMS: Do you have any other symptoms? (e.g., drainage from wound, painful urination, constipation)       Neck soreness, soreness in glands on front of neck  Protocols used: Post-Op Symptoms and Questions-A-AH

## 2024-01-18 NOTE — Telephone Encounter (Signed)
 Pt called in stating that since her cardioversion she has been having bad headaches

## 2024-01-18 NOTE — Telephone Encounter (Signed)
Agree, message sent

## 2024-01-18 NOTE — ED Notes (Signed)
 Pt ambulatory to restroom. States she feels much better since lasix  administration.

## 2024-01-18 NOTE — Telephone Encounter (Signed)
 Message received from [facility] monitoring site that we are temporarily unable to receive remote transmissions because the patient's device has entered into RF telemetry lock out   Spoke with [employer] and there is no work around to this lockout and device will be unable to send remote transmissions for at least 4 days per device tech  Called patient back to relay this message and also update her with Dr. Shaune response  Patient requested to come in to device clinic to have her device interrogated to update settings in [employer] and also look for any anomalies (no device testing)  This RN stated he will put patient on device clinic scheduled tentatively for next Thursday 01/24/24 at 1040 and instructed patient to attempt to send a manual transmission that morning before coming to clinic and if RF telemetry is still locked out then we will interrogate device in person  Patient verbalized understanding of plan and was very appreciative of this RN's assistance and listening ear

## 2024-01-18 NOTE — ED Provider Notes (Signed)
 " [place] [facility] AT [facility] Provider Note   CSN: [id number] Arrival date & time: 01/18/24  1934     Patient presents with: Shortness of Breath and Headache   Veronica Bradford is a 79 y.o. female.   Is a 79 year old female presenting emergency department with complaint of chest pain, shortness of breath starting today.  She had a cardioversion this past Wednesday reports that she has had a headache since that time. Had migraines when she was younger, but has not had them in years.  Describes it as a headache rating back to front.  No vision loss, aphasia, dysarthria unilateral weakness.  Triage note mentions facial droop, when asked about it husband stated it appeared her face was droopy while she was taking a nap, but resolved when she was awoken.  No further facial droop or symptoms.  She notes becoming quite short of breath this afternoon prior to arrival having some chest pressure.   Shortness of Breath Associated symptoms: headaches   Headache      Prior to Admission medications  Medication Sig Start Date End Date Taking? Authorizing Provider  acetaminophen  (TYLENOL ) 500 MG tablet Take 500 mg by mouth every 6 (six) hours as needed for moderate pain or headache.    [provider]  acetaminophen -codeine  (TYLENOL  #3) 300-30 MG tablet Take 0.5-1 tablets by mouth every 6 (six) hours as needed for moderate pain (pain score 4-6).    [provider]  apixaban  (ELIQUIS ) 5 MG TABS tablet Take 1 tablet (5 mg total) by mouth 2 (two) times daily. 12/24/23   Sebastian Toribio GAILS, MD  ARTIFICIAL TEAR SOLUTION OP Place 1 drop into both eyes 4 (four) times daily as needed (dry/irritated eyes.).    [provider]  B Complex-C (SUPER B COMPLEX/VITAMIN C PO) Take 1 tablet by mouth daily.    [provider]  Cholecalciferol  (VITAMIN D3) 50 MCG (2000 UT) capsule Take 2,000 Units by mouth daily.    [provider]  Coenzyme Q10  (COQ10) 200 MG CAPS Take 200 mg by mouth daily.    [provider]  colestipol  (COLESTID ) 1 g tablet Take 2 g by mouth 2 (two) times daily.    [provider]  CRANBERRY EXTRACT PO Take 1 tablet by mouth 2 (two) times daily.    [provider]  estradiol  (ESTRACE ) 0.1 MG/GM vaginal cream Place 1 Applicatorful vaginally 3 (three) times a week. 11/10/22   Lowne Chase, Yvonne R, DO  Fe Fum-FA-B Cmp-C-Zn-Mg-Mn-Cu (HEMOCYTE PLUS) 106-1 MG CAPS Take 1 capsule by mouth 2 (two) times daily. 12/25/23   [provider]  ferrous fumarate  (HEMOCYTE - 106 MG FE) 325 (106 Fe) MG TABS tablet Take 1 tablet (106 mg of iron  total) by mouth daily. Take apart from pantoprazole  10/31/23   Amon Aloysius BRAVO, MD  flecainide  (TAMBOCOR ) 100 MG tablet Take 1 tablet (100 mg total) by mouth 2 (two) times daily. 01/14/24   Kennyth Chew, MD  fluticasone  (FLONASE ) 50 MCG/ACT nasal spray Place 1 spray into both nostrils daily.    [provider]  furosemide  (LASIX ) 40 MG tablet TAKE 1 TABLET BY MOUTH AS NEEDED Patient taking differently: Take 20 mg by mouth as needed. 04/10/23   Fernande Elspeth BROCKS, MD  MAGNESIUM  GLYCINATE PO Take 1,000 mg by mouth daily as needed (upset stomach). Magnesium  gummies    [provider]  metoprolol  succinate (TOPROL -XL) 50 MG 24 hr tablet Take 1 tablet (50 mg  total) by mouth daily. Take with or immediately following a meal. Patient taking differently: Take 50 mg by mouth daily. Taking 0.5mg  tab twice daily 12/23/23   Sebastian Toribio GAILS, MD  Multiple Vitamins-Minerals (SENIOR MULTIVITAMIN PLUS PO) Take 1 tablet by mouth in the morning.    [provider]  pantoprazole  (PROTONIX ) 40 MG tablet Take 1 tablet (40 mg total) by mouth 2 (two) times daily. 01/27/21   Dow Arland BROCKS, NP  potassium chloride  SA (KLOR-CON  M) 20 MEQ tablet Take 1 tablet (20 mEq total) by mouth daily. 10/24/23   Terra Fairy PARAS, PA-C  pravastatin  (PRAVACHOL ) 40 MG tablet Take 1  tablet (40 mg total) by mouth every evening. 11/27/23   Kennyth Chew, MD  sucralfate  (CARAFATE ) 1 g tablet Take 1 g by mouth 2 (two) times daily as needed (upset stomach).    [provider]  tirzepatide  (ZEPBOUND ) 7.5 MG/0.5ML Pen Inject 7.5 mg into the skin once a week. Patient taking differently: Inject 7.5 mg into the skin every Sunday. 10/31/23   Paz, Jose E, MD  Wheat Dextrin (BENEFIBER) CHEW Chew 2 each by mouth every evening.    [provider]  Wheat Dextrin (BENEFIBER) POWD Take 1 Scoop by mouth in the morning. Mix one scoop of powder with morning coffee and drink daily.    [provider]    Allergies: Ibuprofen, Nsaids, Tolmetin, Zoster vaccine live, Tape, Amoxicillin, Lactose intolerance (gi), Lisinopril, and Penicillins    Review of Systems  Respiratory:  Positive for shortness of breath.   Neurological:  Positive for headaches.    Updated Vital Signs BP 135/62   Pulse 83   Temp 97.7 F (36.5 C)   Resp (!) 24   Ht 5' 7.5 (1.715 m)   Wt 100.2 kg   SpO2 94%   BMI 34.10 kg/m   Physical Exam Vitals and nursing note reviewed.  Constitutional:      General: She is not in acute distress.    Appearance: She is not toxic-appearing.  HENT:     Head: Normocephalic.  Cardiovascular:     Rate and Rhythm: Normal rate and regular rhythm.  Pulmonary:     Effort: Accessory muscle usage present. No respiratory distress.  Chest:     Chest wall: No tenderness.  Musculoskeletal:     Cervical back: Normal range of motion.     Right lower leg: Edema present.     Left lower leg: Edema present.  Skin:    General: Skin is warm.  Neurological:     Mental Status: She is alert and oriented to person, place, and time.  Psychiatric:        Mood and Affect: Mood normal.        Behavior: Behavior normal.     (all labs ordered are listed, but only abnormal results are displayed) Labs Reviewed  CBC - Abnormal; Notable for the following components:       Result Value   WBC 10.6 (*)    Hemoglobin 11.5 (*)    HCT 35.1 (*)    RDW 18.8 (*)    All other components within normal limits  COMPREHENSIVE METABOLIC PANEL WITH GFR - Abnormal; Notable for the following components:   Glucose, Bld 116 (*)    Total Protein 6.3 (*)    AST 65 (*)    ALT 61 (*)    All other components within normal limits  D-DIMER, QUANTITATIVE - Abnormal; Notable for the following components:   D-Dimer,  Quant 3.06 (*)    All other components within normal limits  PRO BRAIN NATRIURETIC PEPTIDE - Abnormal; Notable for the following components:   Pro Brain Natriuretic Peptide 1,768.0 (*)    All other components within normal limits  RESP PANEL BY RT-PCR (RSV, FLU A&B, COVID)  RVPGX2  TROPONIN T, HIGH SENSITIVITY  TROPONIN T, HIGH SENSITIVITY    EKG: None  Radiology: CT Angio Chest PE W and/or Wo Contrast Result Date: 01/18/2024 EXAM: CTA CHEST 01/18/2024 10:25:00 PM TECHNIQUE: CTA of the chest was performed without and with the administration of 75 mL iohexol  (OMNIPAQUE ) 350 MG/ML injection. Multiplanar reformatted images are provided for review. MIP images are provided for review. Automated exposure control, iterative reconstruction, and/or weight based adjustment of the mA/kV was utilized to reduce the radiation dose to as low as reasonably achievable. COMPARISON: None available. CLINICAL HISTORY: Shortness of breath and chest pain. FINDINGS: PULMONARY ARTERIES: The pulmonary artery shows a normal branching pattern. No filling defect to suggest pulmonary embolism is seen. Main pulmonary artery is normal in caliber. MEDIASTINUM: The heart is at the upper limits of normal in size. Coronary calcifications are noted. The thoracic aorta shows atherosclerotic calcifications without aneurysmal dilatation. The esophagus, as visualized, is within normal limits. LYMPH NODES: No mediastinal, hilar or axillary lymphadenopathy. LUNGS AND PLEURA: Small bilateral pleural effusions are  noted. Changes of pulmonary edema are seen. No focal infiltrate or sizable effusion is noted. A 5 mm nodule is noted in the lateral aspect of the right middle lobe. No follow-up is recommended. No pneumothorax. UPPER ABDOMEN: Limited images of the upper abdomen are unremarkable. SOFT TISSUES AND BONES: Degenerative changes of the thoracic spine are seen. No acute soft tissue abnormality. IMPRESSION: 1. No pulmonary embolism. 2. Small bilateral pleural effusions and changes of pulmonary edema. No focal infiltrate or sizable effusion. 3. 5 mm nodule in the right middle lobe. No routine follow-up imaging is recommended per Fleischner Society Guidelines. Electronically signed by: Oneil Devonshire MD 01/18/2024 10:46 PM EST RP Workstation: [id number]   CT Head Wo Contrast Result Date: 01/18/2024 CLINICAL DATA:  New onset headache EXAM: CT HEAD WITHOUT CONTRAST TECHNIQUE: Contiguous axial images were obtained from the base of the skull through the vertex without intravenous contrast. RADIATION DOSE REDUCTION: This exam was performed according to the departmental dose-optimization program which includes automated exposure control, adjustment of the mA and/or kV according to patient size and/or use of iterative reconstruction technique. COMPARISON:  CT brain 11/22/2022 FINDINGS: Brain: No acute territorial infarction, hemorrhage or intracranial mass. The ventricles are nonenlarged. Vascular: No hyperdense vessels.  Carotid vascular calcification Skull: Normal. Negative for fracture or focal lesion. Sinuses/Orbits: No acute finding. Other: None IMPRESSION: No CT evidence for acute intracranial abnormality Electronically Signed   By: Luke Bun M.D.   On: 01/18/2024 21:17   DG Chest 2 View Result Date: 01/18/2024 EXAM: 2 VIEW(S) XRAY OF THE CHEST 01/18/2024 09:02:00 PM COMPARISON: 12/18/2023 CLINICAL HISTORY: SOB FINDINGS: LINES, TUBES AND DEVICES: Left pacer is unchanged. LUNGS AND PLEURA: Small bilateral pleural  effusions. Bibasilar atelectasis. Interstitial prominence throughout the lungs, this could reflect early interstitial edema. No focal pulmonary opacity. No pneumothorax. HEART AND MEDIASTINUM: No acute abnormality of the cardiac and mediastinal silhouettes. BONES AND SOFT TISSUES: No acute osseous abnormality. IMPRESSION: 1. Interstitial prominence throughout the lungs, which may reflect early interstitial edema. 2. Small bilateral pleural effusions with bibasilar atelectasis. Electronically signed by: Franky Crease MD 01/18/2024 09:08 PM EST RP Workstation: [id number]  Procedures   Medications Ordered in the ED  apixaban  (ELIQUIS ) tablet 5 mg (5 mg Oral Given 01/18/24 2205)  flecainide  (TAMBOCOR ) tablet 100 mg (100 mg Oral Self Administered 01/18/24 2211)  metoprolol  succinate (TOPROL -XL) 24 hr tablet 25 mg (has no administration in time range)  acetaminophen  (TYLENOL ) tablet 650 mg (650 mg Oral Given 01/18/24 2034)  sodium chloride  0.9 % bolus 500 mL (0 mLs Intravenous Stopped 01/18/24 2201)  metoCLOPramide  (REGLAN ) injection 10 mg (10 mg Intravenous Given 01/18/24 2206)  iohexol  (OMNIPAQUE ) 350 MG/ML injection 75 mL (75 mLs Intravenous Contrast Given 01/18/24 2224)  furosemide  (LASIX ) injection 40 mg (40 mg Intravenous Given 01/18/24 2235)    Clinical Course as of 01/18/24 2321  Fri Jan 18, 2024  2013 Echo in 2023 with normal EF per my chart review.  [TY]  2121 CT Head Wo Contrast IMPRESSION: No CT evidence for acute intracranial abnormality   Electronically Signed   By: Luke Bun M.D.   On: 01/18/2024 21:17   [TY]  2257 CT Angio Chest PE W and/or Wo Contrast MPRESSION: 1. No pulmonary embolism. 2. Small bilateral pleural effusions and changes of pulmonary edema. No focal infiltrate or sizable effusion. 3. 5 mm nodule in the right middle lobe. No routine follow-up imaging is recommended per Fleischner Society Guidelines.  Electronically signed by: Oneil Devonshire MD 01/18/2024  10:46 PM EST RP Workstation: [id number]   [TY]    Clinical Course User Index [TY] Neysa Caron PARAS, DO                                 Medical Decision Making This is a 79 year old female presenting emergency department chest pain shortness of breath.  She is afebrile nontachycardic, hypertensive on arrival 175/100, improved without blood pressure medications.  Clinically with some degree of hypervolemia, mild edema.  Coarse breath sounds.  Chest x-ray with possible pulmonary edema.  Follow-up BNP 1768.  No history of heart failure.  Last EF in 2023 normal.  New onset heart failure?.  Given her recent procedure concern for possible PE, however overall lower risk especially given she is on Eliquis .  D-dimer ordered and was elevated.  Follow-up CTA without PE, but did show pulmonary edema again.  Troponins without evidence of ischemia.  Other labs largely stable.  Flu/COVID/RSV was negative.  Unlikely causing her symptoms.  Given her new onset headache concern for possible intracranial pathology.  CT head negative however.  Low suspicion for acute stroke.  Given migraine cocktail Reglan  and Tylenol  with some improvement of her headache.  She did give a small fluid bolus before chest x-ray and BMP had resulted.  Case discussed with Dr. Winfred with cardiology recommending admission to [facility] and diuresis.  Consult out to cardiology.  Amount and/or Complexity of Data Reviewed Independent Historian:     Details: Has been noted with peers to be facial droop while patient was sleeping.  But resolved as soon as she woke External Data Reviewed:     Details: Cardioversion recently.  On Eliquis .  Also on flecainide  Labs: ordered. Radiology: ordered and independent interpretation performed. Decision-making details documented in ED Course.    Details: Does appear to have some vascular congestion.  No pneumonia pneumothorax ECG/medicine tests: independent interpretation performed.    Details: No  ischemic changes Discussion of management or test interpretation with external provider(s): Cardiology  Risk OTC drugs. Prescription drug management. Decision regarding hospitalization. Diagnosis or treatment  significantly limited by social determinants of health.      Final diagnoses:  None    ED Discharge Orders     None          Neysa Caron PARAS, DO 01/18/24 2321  "

## 2024-01-19 ENCOUNTER — Encounter (HOSPITAL_BASED_OUTPATIENT_CLINIC_OR_DEPARTMENT_OTHER): Payer: Self-pay | Admitting: Family Medicine

## 2024-01-19 DIAGNOSIS — I480 Paroxysmal atrial fibrillation: Secondary | ICD-10-CM

## 2024-01-19 DIAGNOSIS — Z7901 Long term (current) use of anticoagulants: Secondary | ICD-10-CM

## 2024-01-19 DIAGNOSIS — I5033 Acute on chronic diastolic (congestive) heart failure: Secondary | ICD-10-CM | POA: Diagnosis not present

## 2024-01-19 DIAGNOSIS — I110 Hypertensive heart disease with heart failure: Secondary | ICD-10-CM | POA: Diagnosis not present

## 2024-01-19 MED ORDER — HEMOCYTE PLUS 106-1 MG PO CAPS: 1.0000 | ORAL_CAPSULE | Freq: Two times a day (BID) | ORAL | Status: DC

## 2024-01-19 MED ORDER — PRAVASTATIN SODIUM 40 MG PO TABS
40.0000 mg | ORAL_TABLET | Freq: Every day | ORAL | Status: DC
  Administered 2024-01-19 – 2024-01-20 (×2): 40 mg via ORAL
  Filled 2024-01-19 (×3): qty 1

## 2024-01-19 MED ORDER — ONDANSETRON HCL 4 MG PO TABS: 4.0000 mg | ORAL_TABLET | Freq: Four times a day (QID) | ORAL | Status: DC | PRN

## 2024-01-19 MED ORDER — COLESTIPOL HCL 1 G PO TABS
2.0000 g | ORAL_TABLET | Freq: Two times a day (BID) | ORAL | Status: DC
  Administered 2024-01-20 – 2024-01-21 (×4): 2 g via ORAL
  Filled 2024-01-19 (×5): qty 2

## 2024-01-19 MED ORDER — ACETAMINOPHEN 500 MG PO TABS
1000.0000 mg | ORAL_TABLET | Freq: Once | ORAL | Status: AC
  Administered 2024-01-19: 1000 mg via ORAL
  Filled 2024-01-19: qty 2

## 2024-01-19 MED ORDER — PRAVASTATIN SODIUM 40 MG PO TABS: 40.0000 mg | ORAL_TABLET | Freq: Every evening | ORAL | Status: DC

## 2024-01-19 MED ORDER — ONDANSETRON HCL 4 MG/2ML IJ SOLN: 4.0000 mg | Freq: Four times a day (QID) | INTRAMUSCULAR | Status: DC | PRN

## 2024-01-19 MED ORDER — NUTRISOURCE FIBER PO PACK
1.0000 | PACK | Freq: Two times a day (BID) | ORAL | Status: DC
  Administered 2024-01-19 – 2024-01-21 (×4): 1
  Filled 2024-01-19 (×5): qty 1

## 2024-01-19 MED ORDER — SENNOSIDES-DOCUSATE SODIUM 8.6-50 MG PO TABS
1.0000 | ORAL_TABLET | Freq: Every evening | ORAL | Status: DC | PRN
  Filled 2024-01-19: qty 1

## 2024-01-19 MED ORDER — HYDRALAZINE HCL 20 MG/ML IJ SOLN: 5.0000 mg | Freq: Four times a day (QID) | INTRAMUSCULAR | Status: DC | PRN

## 2024-01-19 MED ORDER — PRAVASTATIN SODIUM 40 MG PO TABS: 40.0000 mg | ORAL_TABLET | Freq: Every day | ORAL | Status: DC

## 2024-01-19 MED ORDER — FE FUM-VIT C-VIT B12-FA 460-60-0.01-1 MG PO CAPS
1.0000 | ORAL_CAPSULE | Freq: Two times a day (BID) | ORAL | Status: DC
  Administered 2024-01-20 – 2024-01-21 (×3): 1 via ORAL
  Filled 2024-01-19 (×5): qty 1

## 2024-01-19 MED ORDER — ACETAMINOPHEN 325 MG PO TABS
650.0000 mg | ORAL_TABLET | Freq: Once | ORAL | Status: AC
  Administered 2024-01-19: 650 mg via ORAL
  Filled 2024-01-19: qty 2

## 2024-01-19 MED ORDER — COLESTIPOL HCL 1 G PO TABS
2.0000 g | ORAL_TABLET | Freq: Two times a day (BID) | ORAL | Status: DC
  Filled 2024-01-19: qty 2

## 2024-01-19 MED ORDER — METOPROLOL SUCCINATE ER 25 MG PO TB24
25.0000 mg | ORAL_TABLET | Freq: Two times a day (BID) | ORAL | Status: DC
  Administered 2024-01-19 – 2024-01-21 (×4): 25 mg via ORAL
  Filled 2024-01-19 (×4): qty 1

## 2024-01-19 MED ORDER — FUROSEMIDE 10 MG/ML IJ SOLN
20.0000 mg | Freq: Two times a day (BID) | INTRAMUSCULAR | Status: AC
  Administered 2024-01-19 – 2024-01-20 (×2): 20 mg via INTRAVENOUS
  Filled 2024-01-19 (×2): qty 2

## 2024-01-19 MED ORDER — ACETAMINOPHEN 325 MG PO TABS
650.0000 mg | ORAL_TABLET | Freq: Four times a day (QID) | ORAL | Status: DC | PRN
  Administered 2024-01-19 – 2024-01-21 (×4): 650 mg via ORAL
  Filled 2024-01-19 (×4): qty 2

## 2024-01-19 MED ORDER — BENEFIBER PO POWD: 1.0000 | Freq: Two times a day (BID) | ORAL | Status: DC

## 2024-01-19 MED ORDER — ACETAMINOPHEN 650 MG RE SUPP: 650.0000 mg | Freq: Four times a day (QID) | RECTAL | Status: DC | PRN

## 2024-01-19 MED ORDER — PANTOPRAZOLE SODIUM 40 MG PO TBEC
40.0000 mg | DELAYED_RELEASE_TABLET | Freq: Two times a day (BID) | ORAL | Status: DC
  Administered 2024-01-19 – 2024-01-21 (×4): 40 mg via ORAL
  Filled 2024-01-19 (×4): qty 1

## 2024-01-19 NOTE — Assessment & Plan Note (Signed)
 Apixaban  5 mg BID, flecainide  100 mg BID, metoprolol  succinate 25 mg daily

## 2024-01-19 NOTE — ED Notes (Signed)
 Pt  verbalized wanting to take her morning meds. Wants hubby to bring them. Per verbal ok with Dr. Mannie that is fine. Pt is wanting to take Metoprolol , Flecainide , and Eliquis .

## 2024-01-19 NOTE — ED Notes (Addendum)
 Pt taking home meds at this time. Approved by EDP.   Pt taking metoprolol  succinate 25 mg, flecanide 100 mg, eliquis  10 mg, and K-Dur.

## 2024-01-19 NOTE — Assessment & Plan Note (Signed)
 Suspect secondary to fluid overload at patient was in atrial fibrillation

## 2024-01-19 NOTE — Assessment & Plan Note (Addendum)
 Home metoprolol  succinate 25 mg daily resumed Hydralazine  5 mg IV q6h prn, sbp > 165, 5 days ordered

## 2024-01-19 NOTE — ED Notes (Signed)
 Pt ambulatory to restroom. Given hot tea and protein bar per request.

## 2024-01-19 NOTE — H&P (Signed)
 " History and Physical - Telemedicine  ANAY WALTER FMW:[id number] DOB: 1945/06/09 DOA: 01/18/2024  PCP: Amon Aloysius BRAVO, MD  Patient coming from: home   Referring provider: Dr. Neysa, EDP Telemedicine provider: Dr. Sherre Patient location: [facility] Referring diagnosis: pulmonary edema Patient name and DOB verified: Patient was able to verify her first and last name: Veronica Bradford, date of birth: [birth year]. Patient consented to Telemedicine Evaluation: yes RN virtual assistant: Darryle Lunger, RN Video encounter time and date: 01/19/24 and at approximately: 11:38  Chief Concern: headache, dyspnea on exertion  HPI: Veronica Bradford is a 79 year old female with history of paroxysmal atrial fibrillation, symptomatic atrial fibrillation, morbid obesity, history of pacemaker status with recent cardioversion 01/16/24, on flecainide , GERD, hyperlipidemia, on anticoagulation with Eliquis , IBS.  Vitals in the ED showed t of 97.8, rr 20, hr 70, blood pressure 102/52, SpO2 95% on 2 L nasal cannula.  Serum sodium is 137, K 4.1, chloride 105, bicarb 22, BUN of 14, sCr 0.55, eGFR > 60, nonfasting glucose 116, WBC 10.6, hemoglobin 11.5, platelets of 223.  proBNP was 1768.  High sensitive point was less than 15 and on repeat was 17.  D-dimer was elevated at 3.06.  ED treatment: Furosemide  40 mg IV one-time dose.  01/18/25: I assumed care of patient. Assessment and HP completed via telemedicine encounter. --------------------------------------------- At bedside via telemedicine encounter, patient was able to tell me her first last name, date of birth.  Patient reports that she had cardioversion on Wednesday, 01/16/2024.  Promptly after, she developed a dull headache.  On Thursday, the headache intensified.  And then on Friday, patient felt like her head was going to split into and the blood was getting gush out of her eyeballs due to the terrible headache.  She felt like it was a pulsatile sensation.  She called her  cardiologist and was recommended to come to the emergency department for further evaluation.  When her husband drove her to the ED, when she got out of the car, the walk from the car to the ED was terrible for her.  She reports she had worsening shortness of breath.  She reports this is more than her baseline shortness of breath with exertion.  Note she gained approximately 2.5 lbs since the cardioversion.  She reports the weight has remained at the increased 2.5 pounds and has not increased further.  She denies any swelling in her lower extremities. She reports that she has been prescribed Lasix  in the past however her SBP has always been in the 100s and therefore she has been fearful to take furosemide  due to being concern for lowering her systolic blood pressure.  She denies trauma to her person.  Of note, 3 weeks prior to the scheduled cardioversion, patient was off of antiarrhythmic medication.  She was able to feel her atrial fibrillation sensation.  Patient reports that the symptoms associated with atrial fibrillation for her is that she feels mildly short of breath with ambulation.  Reports that she felt the symptoms during the time she was off of her antiarrhythmic medication.  She reports that in her youth, she had migraine headaches.  She reports that headaches were so severe that she would see lights.  She reports is been many years since then she has had migraine headaches.  She reports that this headache that she developed since after the cardioversion is different than before.  Social history: She lives at home with her husband.  She denies tobacco, EtOH, recreational drug use.  She is retired and previously was a Advertising Account Executive.  ROS: Constitutional: + weight change, no fever ENT/Mouth: no sore throat, no rhinorrhea Eyes: no eye pain, no vision changes Cardiovascular: no chest pain, + dyspnea,  no edema, no palpitations Respiratory: no cough, no sputum, no  wheezing Gastrointestinal: no nausea, no vomiting, no diarrhea, no constipation Genitourinary: no urinary incontinence, no dysuria, no hematuria Musculoskeletal: no arthralgias, no myalgias Skin: no skin lesions, no pruritus, Neuro: + weakness, no loss of consciousness, no syncope, + headache Psych: no anxiety, no depression, no decrease appetite Heme/Lymph: no bruising, no bleeding  Assessment/Plan  Principal Problem:   Acute on chronic heart failure with preserved ejection fraction (HFpEF) (HCC) Active Problems:   Chronic anticoagulation   Dyslipidemia   Morbid obesity (HCC)   OSA on CPAP   GERD (gastroesophageal reflux disease)   HTN (hypertension)   IBS (irritable bowel syndrome)   RBBB   Pacemaker   Paroxysmal A-fib (HCC)   DOE (dyspnea on exertion)   Symptomatic anemia   Assessment and Plan:  * Acute on chronic heart failure with preserved ejection fraction (HFpEF) (HCC) Presumed secondary to atrial fibrillation  Patient was without rate/rhythm control for 2-3 weeks prior to anticoagulation. She reports compliance with eliquis  Strict I/Os Complete echo ordered Furosemide  20 mg BID, 2 doses ordered on admission Telemetry  Dyslipidemia Pravastatin  40 mg every evening resumed  Chronic anticoagulation Home apixaban  5 mg BID resumed  OSA on CPAP Cpap at bedtime ordered  Morbid obesity (HCC) This complicates overall care and prognosis.   DOE (dyspnea on exertion) Suspect secondary to fluid overload at patient was in atrial fibrillation  Paroxysmal A-fib (HCC) Apixaban  5 mg BID, flecainide  100 mg BID, metoprolol  succinate 25 mg daily  HTN (hypertension) Home metoprolol  succinate 25 mg daily resumed Hydralazine  5 mg IV q6h prn, sbp > 165, 5 days ordered  Chart reviewed.   DVT prophylaxis: Apixaban  5 mg p.o. twice daily Code Status: Full code Diet: Heart healthy diet Family Communication: Updated husband at bedside Disposition Plan: Pending clinical  course Consults called: Cardiology Admission status: Telemetry, inpatient  Past Medical History:  Diagnosis Date   Allergy    Anemia 2021   Arthritis    Atrial fibrillation (HCC)    Atrial fibrillation (HCC)    Back pain    Blood transfusion without reported diagnosis    Cataract 2021   Removed   CHF (congestive heart failure) (HCC)    grade 2 HF   Chronic sinusitis    s/p 2 surgeries remotely, Dr Ethyl   Closed fracture of metatarsal of left foot    L foot, fifth metatarsal   Complication of anesthesia    woke up once during procedure   Dysrhythmia    a-fib   Ectopic pregnancy    Endometrial cancer (HCC) 2004   Early diagnosis   GERD (gastroesophageal reflux disease)    Heart murmur    Hyperlipemia    Hypertension    IBS (irritable bowel syndrome) 08/13/2014   Dx 2015, diarrhea on-off, Dr Donnald    Lumbar radiculopathy 08/2012   MRI in 08/2012   Mild hyperparathyroidism    Osteopenia    PAF (paroxysmal atrial fibrillation) (HCC)    Presence of permanent cardiac pacemaker    Sciatica of right side 08/2013   Received Depomedrol 80 mg injection   Sleep apnea 09/09/2013   Dx with OSA in 2015, by Dr. Fernande,   wears CPAPAPAP   Past Surgical History:  Procedure Laterality Date   ABDOMINAL HYSTERECTOMY     ATRIAL FIBRILLATION ABLATION N/A 09/04/2017   Procedure: ATRIAL FIBRILLATION ABLATION;  Surgeon: Kelsie Agent, MD;  Location: [facility] INVASIVE CV LAB;  Service: Cardiovascular;  Laterality: N/A;   ATRIAL FIBRILLATION ABLATION N/A 08/01/2023   Procedure: ATRIAL FIBRILLATION ABLATION;  Surgeon: Kennyth Chew, MD;  Location: [facility] INVASIVE CV LAB;  Service: Cardiovascular;  Laterality: N/A;   BIOPSY  03/11/2020   Procedure: BIOPSY;  Surgeon: Donnald Charleston, MD;  Location: [facility] ENDOSCOPY;  Service: Endoscopy;;  EGD and COLON   CARDIOVERSION N/A 01/16/2024   Procedure: CARDIOVERSION;  Surgeon: Ren Donley Joelle VEAR, MD;  Location: [facility] INVASIVE CV LAB;  Service: Cardiovascular;   Laterality: N/A;   CARPAL TUNNEL RELEASE Right 06/17/2019   CARPAL TUNNEL RELEASE Left 11/2020   CHOLECYSTECTOMY  ~2006   COLONOSCOPY N/A 12/22/2023   Procedure: COLONOSCOPY;  Surgeon: Rosalie Kitchens, MD;  Location: [facility] ENDOSCOPY;  Service: Gastroenterology;  Laterality: N/A;   COLONOSCOPY WITH PROPOFOL  N/A 03/11/2020   Procedure: COLONOSCOPY WITH PROPOFOL ;  Surgeon: Donnald Charleston, MD;  Location: [facility] ENDOSCOPY;  Service: Endoscopy;  Laterality: N/A;   DILATION AND CURETTAGE OF UTERUS     after SAB   ESOPHAGOGASTRODUODENOSCOPY  02/09/2014   Dr. Donnald   ESOPHAGOGASTRODUODENOSCOPY N/A 12/21/2023   Procedure: EGD (ESOPHAGOGASTRODUODENOSCOPY);  Surgeon: Rosalie Kitchens, MD;  Location: [facility] ENDOSCOPY;  Service: Gastroenterology;  Laterality: N/A;   ESOPHAGOGASTRODUODENOSCOPY (EGD) WITH PROPOFOL  N/A 03/11/2020   Procedure: ESOPHAGOGASTRODUODENOSCOPY (EGD) WITH PROPOFOL ;  Surgeon: Donnald Charleston, MD;  Location: [facility] ENDOSCOPY;  Service: Endoscopy;  Laterality: N/A;   EYE SURGERY  2021   FINGER SURGERY Left    index   FRACTURE SURGERY  07/18/2021   L total hip replacement   JOINT REPLACEMENT     NASAL SINUS SURGERY     x 2 remotely, Dr Ethyl   PACEMAKER IMPLANT N/A 09/13/2020   Procedure: PACEMAKER IMPLANT;  Surgeon: Fernande Elspeth BROCKS, MD;  Location: [facility] INVASIVE CV LAB;  Service: Cardiovascular;  Laterality: N/A;   PARATHYROIDECTOMY Left 09/14/2022   Procedure: LEFT PARATHYROIDECTOMY;  Surgeon: Eletha Boas, MD;  Location: [facility] ORS;  Service: General;  Laterality: Left;   TONSILLECTOMY     TOTAL ABDOMINAL HYSTERECTOMY W/ BILATERAL SALPINGOOPHORECTOMY     TOTAL HIP ARTHROPLASTY Left 07/18/2021   Procedure: TOTAL HIP ARTHROPLASTY ANTERIOR APPROACH;  Surgeon: Fidel Rogue, MD;  Location: [facility] ORS;  Service: Orthopedics;  Laterality: Left;   TOTAL KNEE ARTHROPLASTY Right ~2008   Social History:  reports that she quit smoking about 44 years ago. Her smoking use included cigarettes. She has a 2.5 pack-year  smoking history. She has never used smokeless tobacco. She reports that she does not currently use alcohol  after a past usage of about 1.0 standard drink of alcohol  per week. She reports that she does not use drugs.  Allergies[1] Family History  Problem Relation Age of Onset   Atrial fibrillation Mother    Hypertension Mother    Colon cancer Mother    Breast cancer Mother    Ovarian cancer Mother    Cancer Mother    Hearing loss Mother    Varicose Veins Mother    Hypertension Father    Diabetes Father    Alcohol  abuse Father    Vision loss Father    Head & neck cancer Sister    Birth defects Son    Intellectual disability Son    CAD Neg Hx    Family history: Family history reviewed and not  pertinent.  Prior to Admission medications  Medication Sig Start Date End Date Taking? Authorizing Provider  acetaminophen  (TYLENOL ) 500 MG tablet Take 500 mg by mouth every 6 (six) hours as needed for moderate pain or headache.    [provider]  acetaminophen -codeine  (TYLENOL  #3) 300-30 MG tablet Take 0.5-1 tablets by mouth every 6 (six) hours as needed for moderate pain (pain score 4-6).    [provider]  apixaban  (ELIQUIS ) 5 MG TABS tablet Take 1 tablet (5 mg total) by mouth 2 (two) times daily. 12/24/23   Sebastian Toribio GAILS, MD  ARTIFICIAL TEAR SOLUTION OP Place 1 drop into both eyes 4 (four) times daily as needed (dry/irritated eyes.).    [provider]  B Complex-C (SUPER B COMPLEX/VITAMIN C PO) Take 1 tablet by mouth daily.    [provider]  Cholecalciferol  (VITAMIN D3) 50 MCG (2000 UT) capsule Take 2,000 Units by mouth daily.    [provider]  Coenzyme Q10 (COQ10) 200 MG CAPS Take 200 mg by mouth daily.    [provider]  colestipol  (COLESTID ) 1 g tablet Take 2 g by mouth 2 (two) times daily.    [provider]  CRANBERRY EXTRACT PO Take 1 tablet by mouth 2 (two) times daily.    [provider]  estradiol   (ESTRACE ) 0.1 MG/GM vaginal cream Place 1 Applicatorful vaginally 3 (three) times a week. 11/10/22   Lowne Chase, Yvonne R, DO  Fe Fum-FA-B Cmp-C-Zn-Mg-Mn-Cu (HEMOCYTE PLUS) 106-1 MG CAPS Take 1 capsule by mouth 2 (two) times daily. 12/25/23   [provider]  ferrous fumarate  (HEMOCYTE - 106 MG FE) 325 (106 Fe) MG TABS tablet Take 1 tablet (106 mg of iron  total) by mouth daily. Take apart from pantoprazole  10/31/23   Paz, Jose E, MD  flecainide  (TAMBOCOR ) 100 MG tablet Take 1 tablet (100 mg total) by mouth 2 (two) times daily. 01/14/24   Kennyth Chew, MD  fluticasone  (FLONASE ) 50 MCG/ACT nasal spray Place 1 spray into both nostrils daily.    [provider]  furosemide  (LASIX ) 40 MG tablet TAKE 1 TABLET BY MOUTH AS NEEDED Patient taking differently: Take 20 mg by mouth as needed. 04/10/23   Fernande Elspeth BROCKS, MD  MAGNESIUM  GLYCINATE PO Take 1,000 mg by mouth daily as needed (upset stomach). Magnesium  gummies    [provider]  metoprolol  succinate (TOPROL -XL) 50 MG 24 hr tablet Take 1 tablet (50 mg total) by mouth daily. Take with or immediately following a meal. Patient taking differently: Take 50 mg by mouth daily. Taking 0.5mg  tab twice daily 12/23/23   Sebastian Toribio GAILS, MD  Multiple Vitamins-Minerals (SENIOR MULTIVITAMIN PLUS PO) Take 1 tablet by mouth in the morning.    [provider]  pantoprazole  (PROTONIX ) 40 MG tablet Take 1 tablet (40 mg total) by mouth 2 (two) times daily. 01/27/21   Dow Arland BROCKS, NP  potassium chloride  SA (KLOR-CON  M) 20 MEQ tablet Take 1 tablet (20 mEq total) by mouth daily. 10/24/23   Terra Fairy PARAS, PA-C  pravastatin  (PRAVACHOL ) 40 MG tablet Take 1 tablet (40 mg total) by mouth every evening. 11/27/23   Kennyth Chew, MD  sucralfate  (CARAFATE ) 1 g tablet Take 1 g by mouth 2 (two) times daily as needed (upset stomach).    [provider]  tirzepatide  (ZEPBOUND ) 7.5 MG/0.5ML Pen Inject 7.5 mg into the skin once a  week. Patient taking differently: Inject 7.5 mg into the skin every Sunday. 10/31/23  Paz, Jose E, MD  Wheat Dextrin (BENEFIBER) CHEW Chew 2 each by mouth every evening.    [provider]  Wheat Dextrin (BENEFIBER) POWD Take 1 Scoop by mouth in the morning. Mix one scoop of powder with morning coffee and drink daily.    [provider]   Physical Exam completed with assistance of: Darryle Lunger, RN, who was at bedside during this portion of the virtual encounter:  Vitals:   01/19/24 1145 01/19/24 1200 01/19/24 1215 01/19/24 1312  BP:   (!) 126/54 (!) 141/68  Pulse: 70 70 70 70  Resp: (!) 21 17 20 20   Temp:    98.2 F (36.8 C)  TempSrc:    Oral  SpO2: 96% 93% 94% 99%  Weight:    100.7 kg  Height:    5' 7.5 (1.715 m)   Constitutional: appears age-appropriate, mildly anxious Eyes: EOMI,  conjunctivae normal ENMT: Mucous membranes are moist. Hearing appropriate Neck: normal, supple, no masses, no thyromegaly Respiratory: Diminished lung sounds in bilateral bases, otherwise clear to auscultation in upper lobes, no wheezing. Normal respiratory effort. No accessory muscle use.  Cardiovascular: Regular rate and rhythm, no murmurs. No extremity edema. Abdomen: no tenderness. Bowel sounds positive.  Musculoskeletal: No joint deformity upper and lower extremities. Good ROM, no contractures, no atrophy. Skin: no rashes, ulcers on visible skin Neurologic: Strength is appropriate upper extremities.  Psychiatric: Normal judgment and insight. Alert and oriented x 3. Normal mood.   EKG: independently reviewed, showing sinus rhythm with rate of 70, QTc 442  Chest x-ray on Admission: I personally reviewed and I agree with radiologist reading as below.  CT Angio Chest PE W and/or Wo Contrast Result Date: 01/18/2024 EXAM: CTA CHEST 01/18/2024 10:25:00 PM TECHNIQUE: CTA of the chest was performed without and with the administration of 75 mL iohexol  (OMNIPAQUE ) 350 MG/ML injection.  Multiplanar reformatted images are provided for review. MIP images are provided for review. Automated exposure control, iterative reconstruction, and/or weight based adjustment of the mA/kV was utilized to reduce the radiation dose to as low as reasonably achievable. COMPARISON: None available. CLINICAL HISTORY: Shortness of breath and chest pain. FINDINGS: PULMONARY ARTERIES: The pulmonary artery shows a normal branching pattern. No filling defect to suggest pulmonary embolism is seen. Main pulmonary artery is normal in caliber. MEDIASTINUM: The heart is at the upper limits of normal in size. Coronary calcifications are noted. The thoracic aorta shows atherosclerotic calcifications without aneurysmal dilatation. The esophagus, as visualized, is within normal limits. LYMPH NODES: No mediastinal, hilar or axillary lymphadenopathy. LUNGS AND PLEURA: Small bilateral pleural effusions are noted. Changes of pulmonary edema are seen. No focal infiltrate or sizable effusion is noted. A 5 mm nodule is noted in the lateral aspect of the right middle lobe. No follow-up is recommended. No pneumothorax. UPPER ABDOMEN: Limited images of the upper abdomen are unremarkable. SOFT TISSUES AND BONES: Degenerative changes of the thoracic spine are seen. No acute soft tissue abnormality. IMPRESSION: 1. No pulmonary embolism. 2. Small bilateral pleural effusions and changes of pulmonary edema. No focal infiltrate or sizable effusion. 3. 5 mm nodule in the right middle lobe. No routine follow-up imaging is recommended per Fleischner Society Guidelines. Electronically signed by: Oneil Devonshire MD 01/18/2024 10:46 PM EST RP Workstation: [id number]   CT Head Wo Contrast Result Date: 01/18/2024 CLINICAL DATA:  New onset headache EXAM: CT HEAD WITHOUT CONTRAST TECHNIQUE: Contiguous axial images were obtained from the base of the skull through the vertex without intravenous contrast. RADIATION  DOSE REDUCTION: This exam was performed according  to the departmental dose-optimization program which includes automated exposure control, adjustment of the mA and/or kV according to patient size and/or use of iterative reconstruction technique. COMPARISON:  CT brain 11/22/2022 FINDINGS: Brain: No acute territorial infarction, hemorrhage or intracranial mass. The ventricles are nonenlarged. Vascular: No hyperdense vessels.  Carotid vascular calcification Skull: Normal. Negative for fracture or focal lesion. Sinuses/Orbits: No acute finding. Other: None IMPRESSION: No CT evidence for acute intracranial abnormality Electronically Signed   By: Luke Bun M.D.   On: 01/18/2024 21:17   DG Chest 2 View Result Date: 01/18/2024 EXAM: 2 VIEW(S) XRAY OF THE CHEST 01/18/2024 09:02:00 PM COMPARISON: 12/18/2023 CLINICAL HISTORY: SOB FINDINGS: LINES, TUBES AND DEVICES: Left pacer is unchanged. LUNGS AND PLEURA: Small bilateral pleural effusions. Bibasilar atelectasis. Interstitial prominence throughout the lungs, this could reflect early interstitial edema. No focal pulmonary opacity. No pneumothorax. HEART AND MEDIASTINUM: No acute abnormality of the cardiac and mediastinal silhouettes. BONES AND SOFT TISSUES: No acute osseous abnormality. IMPRESSION: 1. Interstitial prominence throughout the lungs, which may reflect early interstitial edema. 2. Small bilateral pleural effusions with bibasilar atelectasis. Electronically signed by: Franky Crease MD 01/18/2024 09:08 PM EST RP Workstation: [id number]   Labs on Admission: I have personally reviewed following labs.  CBC: Recent Labs  Lab 01/18/24 2025  WBC 10.6*  HGB 11.5*  HCT 35.1*  MCV 87.3  PLT 223   Basic Metabolic Panel: Recent Labs  Lab 01/18/24 2025  NA 137  K 4.1  CL 105  CO2 22  GLUCOSE 116*  BUN 14  CREATININE 0.55  CALCIUM  9.1   GFR: Estimated Creatinine Clearance: 73.7 mL/min (by C-G formula based on SCr of 0.55 mg/dL).  Liver Function Tests: Recent Labs  Lab 01/18/24 2025  AST 65*   ALT 61*  ALKPHOS 91  BILITOT 0.7  PROT 6.3*  ALBUMIN 3.9   BNP (last 3 results) Recent Labs    01/18/24 2025  PROBNP 1,768.0*   Urine analysis:    Component Value Date/Time   COLORURINE YELLOW 03/07/2023 1341   APPEARANCEUR CLEAR 03/07/2023 1341   LABSPEC 1.010 03/07/2023 1341   PHURINE 6.0 03/07/2023 1341   GLUCOSEU NEGATIVE 03/07/2023 1341   HGBUR NEGATIVE 03/07/2023 1341   BILIRUBINUR negative 03/22/2023 0835   BILIRUBINUR negative 11/10/2022 1528   KETONESUR negative 03/22/2023 0835   KETONESUR NEGATIVE 03/07/2023 1341   PROTEINUR negative 03/22/2023 0835   PROTEINUR NEGATIVE 11/29/2022 1235   UROBILINOGEN 0.2 03/22/2023 0835   UROBILINOGEN 0.2 03/07/2023 1341   NITRITE Negative 03/22/2023 0835   NITRITE NEGATIVE 03/07/2023 1341   LEUKOCYTESUR Trace (A) 03/22/2023 0835   LEUKOCYTESUR NEGATIVE 03/07/2023 1341   This document was prepared using Dragon Voice Recognition software and may include unintentional dictation errors.  Dr. Sherre [facility] Location: [place]  If 7PM-7AM, please contact overnight-coverage provider If 7AM-7PM, please contact day attending provider [email or web address]  01/19/2024, 3:52 PM      [1]  Allergies Allergen Reactions   Ibuprofen Other (See Comments)    Pt is on Xarelto    Nsaids Other (See Comments)    Pt on Xarelto    Tolmetin Other (See Comments)    Pt on Xarelto    Zoster Vaccine Live Other (See Comments)    Serum sickness, high fever, neck pain    Tape Other (See Comments)    Causes blisters    Amoxicillin Rash   Lactose Intolerance (Gi) Diarrhea and Nausea And Vomiting   Lisinopril Cough  Penicillins Rash    Local reaction   "

## 2024-01-19 NOTE — Assessment & Plan Note (Signed)
 -  This complicates overall care and prognosis.

## 2024-01-19 NOTE — Hospital Course (Addendum)
 [name] is a 79 year old female with history of paroxysmal atrial fibrillation, symptomatic atrial fibrillation, morbid obesity, history of pacemaker status with recent cardioversion 01/16/24, on flecainide , GERD, hyperlipidemia, on anticoagulation with Eliquis , IBS.  Vitals in the ED showed t of 97.8, rr 20, hr 70, blood pressure 102/52, SpO2 95% on 2 L nasal cannula.  Serum sodium is 137, K 4.1, chloride 105, bicarb 22, BUN of 14, sCr 0.55, eGFR > 60, nonfasting glucose 116, WBC 10.6, hemoglobin 11.5, platelets of 223.  proBNP was 1768.  HS point was less than 15 and on repeat was 17.  D-dimer was elevated at 3.06.  ED treatment: Furosemide  40 mg IV one-time dose.  01/18/25: I assumed care of patient. Assessment and HP completed via telemedicine encounter.

## 2024-01-19 NOTE — Progress Notes (Signed)
 Talked to patient about wearing CPAP tonight.  Patient stated that her husband would bring her CPAP in later today.  I asked patient if she would like us  to provide her with a CPAP.  She replied that she can only tolerate CPAP wearing her own mask. and that she would not be able to sleep tonight anyway.  Patient is alert and oriented and is able to ambulate back and forth the bathroom and doesn't appear short of breath at this time. I told the patient to let us  know is she changed her mind about wearing out CPAP and she stated that she would.  RT will continue to monitor.

## 2024-01-19 NOTE — Consult Note (Signed)
 "  CARDIOLOGY CONSULT NOTE    Patient ID: Veronica Bradford MRN: [id number], DOB/AGE: [birth year] 79 y.o.  Admit date: 01/18/2024 Date of Consult: 01/19/2024  Primary Physician: Amon Aloysius BRAVO, MD Primary Cardiologist: NA Electrophysiologist: Dr. Kennyth  Patient Profile: Veronica Bradford is a 79 y.o. female with a history of persistent atrial fibrillation, GI bleed, CHFpEF, OSA hypertension who is being seen today for the evaluation of atrial fibrillation and acute on chronic CHFpEF at the request of Dr. Sherre.  HPI:  Veronica Bradford is a 79 y.o. 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 Fernande in 2022.  She had an ablation in 2019 and repeat electrophysiology study and ablation by Dr. Kennyth in July of 2025.  During the second ablation, 3 of 4 pulmonary veins were noted to have reconnected and were reablated.  The posterior wall of the atrium was also reablated.  Prior CTI ablation was noted to be persistently blocked.  She was seen in device clinic on December 16 and noted to be in atrial flutter and sent to the ER.  Anemia was diagnosed, so anticoagulation and flecainide  were held. She received blood transfusions and upper and lower endoscopy which showed hemorrhoids.  After resumption of anticoagulation for an appropriate duration, DC cardioversion was performed on January 14.  Her flecainide  100 mg p.o. twice daily was resumed.  At that time, her pacemaker was reprogrammed from DDIR to DDDR and from a lower rate limit of 60 bpm to 70 bpm.  The paced AV delay was decreased from 350 ms to 180 ms, and the sensed AV delay programmed to 160 ms.  Past Medical History:  Diagnosis Date   Allergy    Anemia 2021   Arthritis    Atrial fibrillation (HCC)    Atrial fibrillation (HCC)    Back pain    Blood transfusion without reported diagnosis     Cataract 2021   Removed   CHF (congestive heart failure) (HCC)    grade 2 HF   Chronic sinusitis    s/p 2 surgeries remotely, Dr Ethyl   Closed fracture of metatarsal of left foot    L foot, fifth metatarsal   Complication of anesthesia    woke up once during procedure   Dysrhythmia    a-fib   Ectopic pregnancy    Endometrial cancer (HCC) 2004   Early diagnosis   GERD (gastroesophageal reflux disease)    Heart murmur    Hyperlipemia    Hypertension    IBS (irritable bowel syndrome) 08/13/2014   Dx 2015, diarrhea on-off, Dr Donnald    Lumbar radiculopathy 08/2012   MRI in 08/2012   Mild hyperparathyroidism    Osteopenia    PAF (paroxysmal atrial fibrillation) (HCC)    Presence of permanent cardiac pacemaker    Sciatica of right side 08/2013   Received Depomedrol 80 mg injection   Sleep apnea 09/09/2013   Dx with OSA in 2015, by Dr. Fernande,   wears CPAPAPAP      Home medications Medications Prior to Admission  Medication Sig Dispense Refill Last Dose/Taking   acetaminophen  (TYLENOL ) 500 MG tablet Take 500 mg by mouth every 6 (six) hours as needed for moderate pain or headache.      acetaminophen -codeine  (TYLENOL  #3) 300-30 MG tablet Take 0.5-1 tablets by mouth every 6 (six) hours as needed for moderate  pain (pain score 4-6).      apixaban  (ELIQUIS ) 5 MG TABS tablet Take 1 tablet (5 mg total) by mouth 2 (two) times daily. 180 tablet 0    ARTIFICIAL TEAR SOLUTION OP Place 1 drop into both eyes 4 (four) times daily as needed (dry/irritated eyes.).      B Complex-C (SUPER B COMPLEX/VITAMIN C PO) Take 1 tablet by mouth daily.      Cholecalciferol  (VITAMIN D3) 50 MCG (2000 UT) capsule Take 2,000 Units by mouth daily.      Coenzyme Q10 (COQ10) 200 MG CAPS Take 200 mg by mouth daily.      colestipol  (COLESTID ) 1 g tablet Take 2 g by mouth 2 (two) times daily.      CRANBERRY EXTRACT PO Take 1 tablet by mouth 2 (two) times daily.      estradiol  (ESTRACE ) 0.1 MG/GM vaginal cream Place  1 Applicatorful vaginally 3 (three) times a week. 42.5 g 12    Fe Fum-FA-B Cmp-C-Zn-Mg-Mn-Cu (HEMOCYTE PLUS) 106-1 MG CAPS Take 1 capsule by mouth 2 (two) times daily.      ferrous fumarate  (HEMOCYTE - 106 MG FE) 325 (106 Fe) MG TABS tablet Take 1 tablet (106 mg of iron  total) by mouth daily. Take apart from pantoprazole       flecainide  (TAMBOCOR ) 100 MG tablet Take 1 tablet (100 mg total) by mouth 2 (two) times daily. 180 tablet 3    fluticasone  (FLONASE ) 50 MCG/ACT nasal spray Place 1 spray into both nostrils daily.      furosemide  (LASIX ) 40 MG tablet TAKE 1 TABLET BY MOUTH AS NEEDED (Patient taking differently: Take 20 mg by mouth as needed.) 90 tablet 3    MAGNESIUM  GLYCINATE PO Take 1,000 mg by mouth daily as needed (upset stomach). Magnesium  gummies      metoprolol  succinate (TOPROL -XL) 50 MG 24 hr tablet Take 1 tablet (50 mg total) by mouth daily. Take with or immediately following a meal. (Patient taking differently: Take 50 mg by mouth daily. Taking 0.5mg  tab twice daily)      Multiple Vitamins-Minerals (SENIOR MULTIVITAMIN PLUS PO) Take 1 tablet by mouth in the morning.      pantoprazole  (PROTONIX ) 40 MG tablet Take 1 tablet (40 mg total) by mouth 2 (two) times daily.      potassium chloride  SA (KLOR-CON  M) 20 MEQ tablet Take 1 tablet (20 mEq total) by mouth daily. 90 tablet 2    pravastatin  (PRAVACHOL ) 40 MG tablet Take 1 tablet (40 mg total) by mouth every evening. 90 tablet 3    sucralfate  (CARAFATE ) 1 g tablet Take 1 g by mouth 2 (two) times daily as needed (upset stomach).      tirzepatide  (ZEPBOUND ) 7.5 MG/0.5ML Pen Inject 7.5 mg into the skin once a week. (Patient taking differently: Inject 7.5 mg into the skin every Sunday.) 2 mL 3    Wheat Dextrin (BENEFIBER) CHEW Chew 2 each by mouth every evening.      Wheat Dextrin (BENEFIBER) POWD Take 1 Scoop by mouth in the morning. Mix one scoop of powder with morning coffee and drink daily.         Physical Exam: Vitals:   01/19/24  1145 01/19/24 1200 01/19/24 1215 01/19/24 1312  BP:   (!) 126/54 (!) 141/68  Pulse: 70 70 70 70  Resp: (!) 21 17 20 20   Temp:    98.2 F (36.8 C)  TempSrc:    Oral  SpO2: 96% 93% 94% 99%  Weight:  100.7 kg  Height:    5' 7.5 (1.715 m)    Gen: Appears comfortable, well-nourished CV: RRR, trace dependent edema The device site is normal -- no tenderness, edema, drainage, redness, threatened erosion.  Pulm: breathing easily  Device interrogation -performed by me personally, with reprogramming Device is programmed DDIR 70, pAV 180, sAV 160 Multiple atrial high rate episodes were detected with atrial rates > 250 bpm, but EGMs are not available presently  PERTINENT STUDIES SUMMARIZED:  Echocardiogram:      ordered  Imaging: Reviewed in epic   EKG: AV dual paced rhythm (personally reviewed)  TELEMETRY:  AV dual paced rhythm, 70 bpm (personally reviewed)  DEVICE HISTORY:  See HPI   ASSESSMENT & PLAN:  Acute on chronic CHF Patient presented with mild volume overload that seems to be progressing since cardioversion on July 14 She has also been experiencing headache, which is improved with diuresis At that time, flecainide  100 mg twice daily was added, and programming changes were made to the device TTE is pending -if EF is reduced or there are structural abnormalities, we will need to discontinue flecainide  I reprogrammed her device today to DDIR with a lower rate limit of 65 bpm and a paced AV delay of 200 ms  Persistent atrial fibrillation and flutter Continue flecainide  100mg  PO BID for now Device noted atrial high rate episodes prior to admission yesterday, but there were no available EGMs. Pacemaker has been reprogrammed to DDIR  Headache Very possibly from flecainide , but she has tolerated flecainide  in the past at similar dosing Will see if device programming changes and diuresis improve her symptoms    For questions or updates, please contact [facility] Please consult [email or web address] for contact info under Cardiology/STEMI.  Signed, Eulas Furbish, MD 01/19/2024 1:54 PM       "

## 2024-01-19 NOTE — Assessment & Plan Note (Signed)
 Cpap at bedtime ordered

## 2024-01-19 NOTE — Assessment & Plan Note (Signed)
Pravastatin 40 mg every evening resumed

## 2024-01-19 NOTE — ED Provider Notes (Signed)
" ° ° °  ED Course / MDM   Clinical Course as of 01/19/24 0645  [place] Jan 18, 2024  2013 Echo in 2023 with normal EF per my chart review.  [TY]  2121 CT Head Wo Contrast IMPRESSION: No CT evidence for acute intracranial abnormality   Electronically Signed   By: Luke Bun M.D.   On: 01/18/2024 21:17   [TY]  2257 CT Angio Chest PE W and/or Wo Contrast MPRESSION: 1. No pulmonary embolism. 2. Small bilateral pleural effusions and changes of pulmonary edema. No focal infiltrate or sizable effusion. 3. 5 mm nodule in the right middle lobe. No routine follow-up imaging is recommended per Fleischner Society Guidelines.  Electronically signed by: Oneil Devonshire MD 01/18/2024 10:46 PM EST RP Workstation: [id number]   [TY]  2336 Received sign out from Dr. Neysa presenting with headache, new CHF s/p cardioversion. Imaging with pulmonary edema. Pending admission  [WS]  Sat Jan 19, 2024  9354 Patient has been admitted to hospitalist, pending bed.  [WS]    Clinical Course User Index [TY] Neysa Caron PARAS, DO [WS] Francesca Elsie CROME, MD   Medical Decision Making Amount and/or Complexity of Data Reviewed Labs: ordered. Radiology: ordered. Decision-making details documented in ED Course.  Risk OTC drugs. Prescription drug management. Decision regarding hospitalization.          Francesca Elsie CROME, MD 01/19/24 [phone]  "

## 2024-01-19 NOTE — Progress Notes (Signed)
 Plan of Care Note for accepted transfer   Patient: Veronica Bradford MRN: [id number]   DOA: 01/18/2024  Facility requesting transfer: [facility]   Requesting Provider: Dr. Francesca   Reason for transfer: Acute CHF   Facility course: 79 yr old female with HTN, OSA, HFpEF, and atrial fibrillation on Xarelto  who was cardioverted on 1/14 and has had headaches ever since and now also pleuritic pain and SOB.   Pro-BNP is 1768 and troponin normal x2. CTA is negative for PE but notable for pulmonary edema and small bilateral pleural effusions.   Cardiology (Dr. Donnel) recommended medical admission to [facility] for diuresis. She was given IV Lasix .   Plan of care: The patient is accepted for admission to Telemetry unit, at [facility].   Author: Evalene GORMAN Sprinkles, MD 01/19/2024  Check [email or web address] for on-call coverage.  Nursing staff, Please call [facility] Admits & Consults System-Wide number on Amion as soon as patient's arrival, so appropriate admitting provider can evaluate the pt.

## 2024-01-19 NOTE — Assessment & Plan Note (Addendum)
 Presumed secondary to atrial fibrillation  Patient was without rate/rhythm control for 2-3 weeks prior to anticoagulation. She reports compliance with eliquis  Strict I/Os Complete echo ordered Furosemide  20 mg BID, 2 doses ordered on admission Telemetry

## 2024-01-19 NOTE — Assessment & Plan Note (Signed)
 Home apixaban  5 mg BID resumed

## 2024-01-19 NOTE — Plan of Care (Signed)

## 2024-01-20 ENCOUNTER — Inpatient Hospital Stay (HOSPITAL_COMMUNITY)

## 2024-01-20 DIAGNOSIS — I5033 Acute on chronic diastolic (congestive) heart failure: Secondary | ICD-10-CM | POA: Diagnosis not present

## 2024-01-20 LAB — CBC
HCT: 30 % — ABNORMAL LOW (ref 36.0–46.0)
Hemoglobin: 9.7 g/dL — ABNORMAL LOW (ref 12.0–15.0)
MCH: 28.2 pg (ref 26.0–34.0)
MCHC: 32.3 g/dL (ref 30.0–36.0)
MCV: 87.2 fL (ref 80.0–100.0)
Platelets: 191 K/uL (ref 150–400)
RBC: 3.44 MIL/uL — ABNORMAL LOW (ref 3.87–5.11)
RDW: 18.6 % — ABNORMAL HIGH (ref 11.5–15.5)
WBC: 6.4 K/uL (ref 4.0–10.5)
nRBC: 0 % (ref 0.0–0.2)

## 2024-01-20 LAB — BASIC METABOLIC PANEL WITH GFR
Anion gap: 10 (ref 5–15)
BUN: 15 mg/dL (ref 8–23)
CO2: 27 mmol/L (ref 22–32)
Calcium: 8.7 mg/dL — ABNORMAL LOW (ref 8.9–10.3)
Chloride: 104 mmol/L (ref 98–111)
Creatinine, Ser: 0.65 mg/dL (ref 0.44–1.00)
GFR, Estimated: >60 mL/min
Glucose, Bld: 96 mg/dL (ref 70–99)
Potassium: 3.6 mmol/L (ref 3.5–5.1)
Sodium: 140 mmol/L (ref 135–145)

## 2024-01-20 MED ORDER — IRON SUCROSE 400 MG IVPB - SIMPLE MED
400.0000 mg | Freq: Once | Status: DC
  Filled 2024-01-20: qty 270

## 2024-01-20 MED ORDER — POTASSIUM CHLORIDE CRYS ER 20 MEQ PO TBCR
40.0000 meq | EXTENDED_RELEASE_TABLET | Freq: Once | ORAL | Status: AC
  Administered 2024-01-20: 40 meq via ORAL
  Filled 2024-01-20: qty 2

## 2024-01-20 MED ORDER — FUROSEMIDE 40 MG PO TABS
40.0000 mg | ORAL_TABLET | Freq: Every day | ORAL | Status: DC
  Administered 2024-01-21: 40 mg via ORAL
  Filled 2024-01-20: qty 1

## 2024-01-20 MED ORDER — SODIUM CHLORIDE 0.9 % IV SOLN
400.0000 mg | Freq: Once | INTRAVENOUS | Status: AC
  Administered 2024-01-20: 400 mg via INTRAVENOUS
  Filled 2024-01-20: qty 20

## 2024-01-20 NOTE — Progress Notes (Signed)
 "  Electrophysiology Progress Note  Patient Name: Veronica Bradford Date of Encounter: 01/20/2024  Primary Cardiologist: NA Electrophysiologist: Dr. Kennyth   Subjective   She reports that she feels much better today from a volume and headache standpoint.  She feels low but nervous that her blood counts are lower and she is receiving IV iron .  She feels that her headache is improving with diuresis but resolved completely with changes the pacemaker programming.  Inpatient Medications    Scheduled Meds:  apixaban   5 mg Oral BID   colestipol   2 g Oral Q12H   Fe Fum-Vit C-Vit B12-FA  1 capsule Oral BID WC   fiber  1 packet Per Tube BID   flecainide   100 mg Oral BID   furosemide   40 mg Oral Daily   metoprolol  succinate  25 mg Oral BID   pantoprazole   40 mg Oral BID   pravastatin   40 mg Oral q1800   Continuous Infusions:  iron  sucrose (VENOFER ) 400 mg in sodium chloride  0.9 % 250 mL IVPB 400 mg (01/20/24 1151)   PRN Meds: acetaminophen  **OR** acetaminophen , hydrALAZINE , ondansetron  **OR** ondansetron  (ZOFRAN ) IV, senna-docusate   Vital Signs    Vitals:   01/19/24 2222 01/19/24 2339 01/20/24 0754 01/20/24 1119  BP: (!) 113/54 (!) 99/51 (!) 126/56 (!) 123/58  Pulse: 70 66 63 67  Resp:  18 16   Temp:  98.3 F (36.8 C) 97.9 F (36.6 C)   TempSrc:  Oral Oral   SpO2:  93% 92% 94%  Weight:      Height:        Intake/Output Summary (Last 24 hours) at 01/20/2024 1225 Last data filed at 01/19/2024 1800 Gross per 24 hour  Intake 740 ml  Output --  Net 740 ml   Filed Weights   01/18/24 1942 01/19/24 1312  Weight: 100.2 kg 100.7 kg    Physical Exam    GEN- The patient is well appearing, alert and oriented x 3 today.   Lungs- Clear to ausculation bilaterally, normal work of breathing Heart- Regular rate and rhythm, no murmurs, rubs or gallops Extremities- no clubbing, cyanosis, or edema Neuro- strength and sensation are intact  Labs      Telemetry    Sinus rhythm  (personally reviewed)  Radiology    CT Angio Chest PE W and/or Wo Contrast Result Date: 01/18/2024 EXAM: CTA CHEST 01/18/2024 10:25:00 PM TECHNIQUE: CTA of the chest was performed without and with the administration of 75 mL iohexol  (OMNIPAQUE ) 350 MG/ML injection. Multiplanar reformatted images are provided for review. MIP images are provided for review. Automated exposure control, iterative reconstruction, and/or weight based adjustment of the mA/kV was utilized to reduce the radiation dose to as low as reasonably achievable. COMPARISON: None available. CLINICAL HISTORY: Shortness of breath and chest pain. FINDINGS: PULMONARY ARTERIES: The pulmonary artery shows a normal branching pattern. No filling defect to suggest pulmonary embolism is seen. Main pulmonary artery is normal in caliber. MEDIASTINUM: The heart is at the upper limits of normal in size. Coronary calcifications are noted. The thoracic aorta shows atherosclerotic calcifications without aneurysmal dilatation. The esophagus, as visualized, is within normal limits. LYMPH NODES: No mediastinal, hilar or axillary lymphadenopathy. LUNGS AND PLEURA: Small bilateral pleural effusions are noted. Changes of pulmonary edema are seen. No focal infiltrate or sizable effusion is noted. A 5 mm nodule is noted in the lateral aspect of the right middle lobe. No follow-up is recommended. No pneumothorax. UPPER ABDOMEN: Limited images of the  upper abdomen are unremarkable. SOFT TISSUES AND BONES: Degenerative changes of the thoracic spine are seen. No acute soft tissue abnormality. IMPRESSION: 1. No pulmonary embolism. 2. Small bilateral pleural effusions and changes of pulmonary edema. No focal infiltrate or sizable effusion. 3. 5 mm nodule in the right middle lobe. No routine follow-up imaging is recommended per Fleischner Society Guidelines. Electronically signed by: Oneil Devonshire MD 01/18/2024 10:46 PM EST RP Workstation: [id number]   CT Head Wo  Contrast Result Date: 01/18/2024 CLINICAL DATA:  New onset headache EXAM: CT HEAD WITHOUT CONTRAST TECHNIQUE: Contiguous axial images were obtained from the base of the skull through the vertex without intravenous contrast. RADIATION DOSE REDUCTION: This exam was performed according to the departmental dose-optimization program which includes automated exposure control, adjustment of the mA and/or kV according to patient size and/or use of iterative reconstruction technique. COMPARISON:  CT brain 11/22/2022 FINDINGS: Brain: No acute territorial infarction, hemorrhage or intracranial mass. The ventricles are nonenlarged. Vascular: No hyperdense vessels.  Carotid vascular calcification Skull: Normal. Negative for fracture or focal lesion. Sinuses/Orbits: No acute finding. Other: None IMPRESSION: No CT evidence for acute intracranial abnormality Electronically Signed   By: Luke Bun M.D.   On: 01/18/2024 21:17   DG Chest 2 View Result Date: 01/18/2024 EXAM: 2 VIEW(S) XRAY OF THE CHEST 01/18/2024 09:02:00 PM COMPARISON: 12/18/2023 CLINICAL HISTORY: SOB FINDINGS: LINES, TUBES AND DEVICES: Left pacer is unchanged. LUNGS AND PLEURA: Small bilateral pleural effusions. Bibasilar atelectasis. Interstitial prominence throughout the lungs, this could reflect early interstitial edema. No focal pulmonary opacity. No pneumothorax. HEART AND MEDIASTINUM: No acute abnormality of the cardiac and mediastinal silhouettes. BONES AND SOFT TISSUES: No acute osseous abnormality. IMPRESSION: 1. Interstitial prominence throughout the lungs, which may reflect early interstitial edema. 2. Small bilateral pleural effusions with bibasilar atelectasis. Electronically signed by: Franky Crease MD 01/18/2024 09:08 PM EST RP Workstation: [id number]     Patient Profile     Veronica Bradford is a 79 y.o. female with a past medical history significant for persistent atrial fibrillation, bradycardia with Abbott dual-chamber pacemaker in place, GI  bleed, CHFpEF, OSA hypertension .    She was admitted for shortness of breath, fatigue, and headache which have been worsening since DC cardioversion was performed on January 14 --Flecainide  was restarted at a higher dose, and programming changes were made to her device at that time.   Assessment & Plan     Acute on chronic CHF Patient presented with mild volume overload that seems to be progressing since cardioversion on July 14 She has also been experiencing headache, which is improved with diuresis At that time, flecainide  100 mg twice daily was added, and programming changes were made to the device TTE is pending - if EF is reduced or there are structural abnormalities, we will need to discontinue flecainide  I reprogrammed her device to DDIR with a lower rate limit of 65 bpm and a paced AV delay of 200 ms (more similar to her pre-DCCV settings) -- this resulted in resolution of her headache and a funny feeling in her neck   Persistent atrial fibrillation and flutter Continue flecainide  100mg  PO BID for now Device noted atrial high rate episodes prior to admission, but there were no available EGMs. 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  Signed, Eulas FORBES Furbish, MD  01/20/2024, 12:25 PM   "

## 2024-01-20 NOTE — Progress Notes (Addendum)
 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  Recently underwent cardioversion 1/14, was started on flecainide  6 days prior. - Admitted in December for anemia, heme positive stools, improved with transfusion, EGD and colonoscopy were unremarkable -In the ED noted to be volume overloaded, mildly hypoxic placed on 2 L O2, sodium 137, creatinine 0.5, hemoglobin 11, proBNP 1768, troponin 15, 17 - Admitted, started on diuretics  Subjective: Feels much better, breathing is improving, concerned about her hemoglobin  Assessment and Plan:  Acute on chronic heart failure with preserved ejection fraction (HFpEF) (HCC) Presumed secondary to recent atrial fibrillation  -Improved with diuresis, appears close to euvolemic - Switch to oral Lasix  today - Cards following, FU ECHO  Iron  deficiency anemia - Hemoglobin in the 9.6-11.5 range over the last 1 month - EGD and colonoscopy in December were unremarkable for active bleeding - Anemia panel then noted iron  deficiency, add IV iron  today - Follow-up with PCP, may benefit from periodic iron  infusions  Paroxysmal A-fib (HCC) Apixaban  5 mg BID, flecainide  100 mg BID, metoprolol  succinate 25 mg daily -in NSR now  Headache - Could be secondary to CHF, appears to have improved, monitor  RML nodule  Dyslipidemia Pravastatin  40 mg every evening resumed  Chronic anticoagulation Home apixaban  5 mg BID resumed  OSA on CPAP Cpap at bedtime ordered  Obesity (HCC) This complicates overall care and prognosis.   HTN (hypertension) Home metoprolol  succinate 25 mg daily resumed   DVT prophylaxis: Eliquis  Code Status: Full code Family Communication: None present Disposition Plan: Home tomorrow if stable  Consultants: Cards   Procedures:   Antimicrobials:     Objective: Vitals:   01/19/24 1312 01/19/24 1930 01/19/24 2222 01/19/24 2339  BP: (!) 141/68 (!) 113/54 (!) 113/54 (!) 99/51  Pulse: 70  70 66  Resp: 20 18  18   Temp: 98.2 F (36.8 C) 98.6 F (37 C)  98.3 F (36.8 C)  TempSrc: Oral Oral  Oral  SpO2: 99%   93%  Weight: 100.7 kg     Height: 5' 7.5 (1.715 m)       Intake/Output Summary (Last 24 hours) at 01/20/2024 0732 Last data filed at 01/19/2024 1800 Gross per 24 hour  Intake 740 ml  Output --  Net 740 ml   Filed Weights   01/18/24 1942 01/19/24 1312  Weight: 100.2 kg 100.7 kg    Examination:  General exam: Appears calm and comfortable  Respiratory system: Clear to auscultation Cardiovascular system: S1 & S2 heard, RRR.  Abd: nondistended, soft and nontender.Normal bowel sounds heard. Central nervous system: Alert and oriented. No focal neurological deficits. Extremities: no edema Skin: No rashes Psychiatry:  Mood & affect appropriate.     Data Reviewed:   CBC: Recent Labs  Lab 01/18/24 2025 01/20/24 0327  WBC 10.6* 6.4  HGB 11.5* 9.7*  HCT 35.1* 30.0*  MCV 87.3 87.2  PLT 223 191   Basic Metabolic Panel: Recent Labs  Lab 01/18/24 2025 01/20/24 0327  NA 137 140  K 4.1 3.6  CL 105 104  CO2 22 27  GLUCOSE 116* 96  BUN 14 15  CREATININE 0.55 0.65  CALCIUM  9.1 8.7*   GFR: Estimated Creatinine Clearance: 73.7 mL/min (by C-G formula based on SCr of 0.65 mg/dL). Liver Function Tests: Recent Labs  Lab 01/18/24 2025  AST  65*  ALT 61*  ALKPHOS 91  BILITOT 0.7  PROT 6.3*  ALBUMIN 3.9   No results for input(s): LIPASE, AMYLASE in the last 168 hours. No results for input(s): AMMONIA in the last 168 hours. Coagulation Profile: No results for input(s): INR, PROTIME in the last 168 hours. Cardiac Enzymes: No results for input(s): CKTOTAL, CKMB, CKMBINDEX, TROPONINI in the last 168 hours. BNP (last 3 results) Recent Labs    01/18/24 2025  PROBNP 1,768.0*   HbA1C: No  results for input(s): HGBA1C in the last 72 hours. CBG: No results for input(s): GLUCAP in the last 168 hours. Lipid Profile: No results for input(s): CHOL, HDL, LDLCALC, TRIG, CHOLHDL, LDLDIRECT in the last 72 hours. Thyroid  Function Tests: No results for input(s): TSH, T4TOTAL, FREET4, T3FREE, THYROIDAB in the last 72 hours. Anemia Panel: No results for input(s): VITAMINB12, FOLATE, FERRITIN, TIBC, IRON , RETICCTPCT in the last 72 hours. Urine analysis:    Component Value Date/Time   COLORURINE YELLOW 03/07/2023 1341   APPEARANCEUR CLEAR 03/07/2023 1341   LABSPEC 1.010 03/07/2023 1341   PHURINE 6.0 03/07/2023 1341   GLUCOSEU NEGATIVE 03/07/2023 1341   HGBUR NEGATIVE 03/07/2023 1341   BILIRUBINUR negative 03/22/2023 0835   BILIRUBINUR negative 11/10/2022 1528   KETONESUR negative 03/22/2023 0835   KETONESUR NEGATIVE 03/07/2023 1341   PROTEINUR negative 03/22/2023 0835   PROTEINUR NEGATIVE 11/29/2022 1235   UROBILINOGEN 0.2 03/22/2023 0835   UROBILINOGEN 0.2 03/07/2023 1341   NITRITE Negative 03/22/2023 0835   NITRITE NEGATIVE 03/07/2023 1341   LEUKOCYTESUR Trace (A) 03/22/2023 0835   LEUKOCYTESUR NEGATIVE 03/07/2023 1341   Sepsis Labs: @LABRCNTIP (procalcitonin:4,lacticidven:4)  ) Recent Results (from the past 240 hours)  Resp panel by RT-PCR (RSV, Flu A&B, Covid) Anterior Nasal Swab     Status: None   Collection Time: 01/18/24  7:44 PM   Specimen: Anterior Nasal Swab  Result Value Ref Range Status   SARS Coronavirus 2 by RT PCR NEGATIVE NEGATIVE Final    Comment: (NOTE) SARS-CoV-2 target nucleic acids are NOT DETECTED.  The SARS-CoV-2 RNA is generally detectable in upper respiratory specimens during the acute phase of infection. The lowest concentration of SARS-CoV-2 viral copies this assay can detect is 138 copies/mL. A negative result does not preclude SARS-Cov-2 infection and should not be used as the sole basis for treatment  or other patient management decisions. A negative result may occur with  improper specimen collection/handling, submission of specimen other than nasopharyngeal swab, presence of viral mutation(s) within the areas targeted by this assay, and inadequate number of viral copies(<138 copies/mL). A negative result must be combined with clinical observations, patient history, and epidemiological information. The expected result is Negative.  Fact Sheet for Patients:  bloggercourse.com  Fact Sheet for Healthcare Providers:  seriousbroker.it  This test is no t yet approved or cleared by the [place]  FDA and  has been authorized for detection and/or diagnosis of SARS-CoV-2 by FDA under an Emergency Use Authorization (EUA). This EUA will remain  in effect (meaning this test can be used) for the duration of the COVID-19 declaration under Section 564(b)(1) of the Act, 21 U.S.C.section 360bbb-3(b)(1), unless the authorization is terminated  or revoked sooner.       Influenza A by PCR NEGATIVE NEGATIVE Final   Influenza B by PCR NEGATIVE NEGATIVE Final    Comment: (NOTE) The Xpert Xpress SARS-CoV-2/FLU/RSV plus assay is intended as an aid in the diagnosis of influenza from Nasopharyngeal swab specimens and should not be used as a  sole basis for treatment. Nasal washings and aspirates are unacceptable for Xpert Xpress SARS-CoV-2/FLU/RSV testing.  Fact Sheet for Patients: bloggercourse.com  Fact Sheet for Healthcare Providers: seriousbroker.it  This test is not yet approved or cleared by the [place]  FDA and has been authorized for detection and/or diagnosis of SARS-CoV-2 by FDA under an Emergency Use Authorization (EUA). This EUA will remain in effect (meaning this test can be used) for the duration of the COVID-19 declaration under Section 564(b)(1) of the Act, 21 U.S.C. section  360bbb-3(b)(1), unless the authorization is terminated or revoked.     Resp Syncytial Virus by PCR NEGATIVE NEGATIVE Final    Comment: (NOTE) Fact Sheet for Patients: bloggercourse.com  Fact Sheet for Healthcare Providers: seriousbroker.it  This test is not yet approved or cleared by the [place]  FDA and has been authorized for detection and/or diagnosis of SARS-CoV-2 by FDA under an Emergency Use Authorization (EUA). This EUA will remain in effect (meaning this test can be used) for the duration of the COVID-19 declaration under Section 564(b)(1) of the Act, 21 U.S.C. section 360bbb-3(b)(1), unless the authorization is terminated or revoked.  Performed at [facility], [address] [place], [place] [address]      Radiology Studies: CT Angio Chest PE W and/or Wo Contrast Result Date: 01/18/2024 EXAM: CTA CHEST 01/18/2024 10:25:00 PM TECHNIQUE: CTA of the chest was performed without and with the administration of 75 mL iohexol  (OMNIPAQUE ) 350 MG/ML injection. Multiplanar reformatted images are provided for review. MIP images are provided for review. Automated exposure control, iterative reconstruction, and/or weight based adjustment of the mA/kV was utilized to reduce the radiation dose to as low as reasonably achievable. COMPARISON: None available. CLINICAL HISTORY: Shortness of breath and chest pain. FINDINGS: PULMONARY ARTERIES: The pulmonary artery shows a normal branching pattern. No filling defect to suggest pulmonary embolism is seen. Main pulmonary artery is normal in caliber. MEDIASTINUM: The heart is at the upper limits of normal in size. Coronary calcifications are noted. The thoracic aorta shows atherosclerotic calcifications without aneurysmal dilatation. The esophagus, as visualized, is within normal limits. LYMPH NODES: No mediastinal, hilar or axillary lymphadenopathy. LUNGS AND PLEURA: Small bilateral  pleural effusions are noted. Changes of pulmonary edema are seen. No focal infiltrate or sizable effusion is noted. A 5 mm nodule is noted in the lateral aspect of the right middle lobe. No follow-up is recommended. No pneumothorax. UPPER ABDOMEN: Limited images of the upper abdomen are unremarkable. SOFT TISSUES AND BONES: Degenerative changes of the thoracic spine are seen. No acute soft tissue abnormality. IMPRESSION: 1. No pulmonary embolism. 2. Small bilateral pleural effusions and changes of pulmonary edema. No focal infiltrate or sizable effusion. 3. 5 mm nodule in the right middle lobe. No routine follow-up imaging is recommended per Fleischner Society Guidelines. Electronically signed by: Oneil Devonshire MD 01/18/2024 10:46 PM EST RP Workstation: [id number]   CT Head Wo Contrast Result Date: 01/18/2024 CLINICAL DATA:  New onset headache EXAM: CT HEAD WITHOUT CONTRAST TECHNIQUE: Contiguous axial images were obtained from the base of the skull through the vertex without intravenous contrast. RADIATION DOSE REDUCTION: This exam was performed according to the departmental dose-optimization program which includes automated exposure control, adjustment of the mA and/or kV according to patient size and/or use of iterative reconstruction technique. COMPARISON:  CT brain 11/22/2022 FINDINGS: Brain: No acute territorial infarction, hemorrhage or intracranial mass. The ventricles are nonenlarged. Vascular: No hyperdense vessels.  Carotid vascular calcification Skull: Normal. Negative for fracture or  focal lesion. Sinuses/Orbits: No acute finding. Other: None IMPRESSION: No CT evidence for acute intracranial abnormality Electronically Signed   By: Luke Bun M.D.   On: 01/18/2024 21:17   DG Chest 2 View Result Date: 01/18/2024 EXAM: 2 VIEW(S) XRAY OF THE CHEST 01/18/2024 09:02:00 PM COMPARISON: 12/18/2023 CLINICAL HISTORY: SOB FINDINGS: LINES, TUBES AND DEVICES: Left pacer is unchanged. LUNGS AND PLEURA: Small  bilateral pleural effusions. Bibasilar atelectasis. Interstitial prominence throughout the lungs, this could reflect early interstitial edema. No focal pulmonary opacity. No pneumothorax. HEART AND MEDIASTINUM: No acute abnormality of the cardiac and mediastinal silhouettes. BONES AND SOFT TISSUES: No acute osseous abnormality. IMPRESSION: 1. Interstitial prominence throughout the lungs, which may reflect early interstitial edema. 2. Small bilateral pleural effusions with bibasilar atelectasis. Electronically signed by: Franky Crease MD 01/18/2024 09:08 PM EST RP Workstation: [id number]     Scheduled Meds:  apixaban   5 mg Oral BID   colestipol   2 g Oral Q12H   Fe Fum-Vit C-Vit B12-FA  1 capsule Oral BID WC   fiber  1 packet Per Tube BID   flecainide   100 mg Oral BID   furosemide   20 mg Intravenous BID   metoprolol  succinate  25 mg Oral BID   pantoprazole   40 mg Oral BID   pravastatin   40 mg Oral q1800   Continuous Infusions:   LOS: 1 day    Time spent:    Sigurd Pac, MD [facility]   01/20/2024, 7:32 AM    "

## 2024-01-20 NOTE — Plan of Care (Signed)

## 2024-01-21 ENCOUNTER — Inpatient Hospital Stay (HOSPITAL_COMMUNITY)

## 2024-01-21 DIAGNOSIS — R0609 Other forms of dyspnea: Secondary | ICD-10-CM

## 2024-01-21 DIAGNOSIS — I5033 Acute on chronic diastolic (congestive) heart failure: Secondary | ICD-10-CM | POA: Diagnosis not present

## 2024-01-21 LAB — CBC
HCT: 32.3 % — ABNORMAL LOW (ref 36.0–46.0)
Hemoglobin: 10.6 g/dL — ABNORMAL LOW (ref 12.0–15.0)
MCH: 29 pg (ref 26.0–34.0)
MCHC: 32.8 g/dL (ref 30.0–36.0)
MCV: 88.3 fL (ref 80.0–100.0)
Platelets: 218 K/uL (ref 150–400)
RBC: 3.66 MIL/uL — ABNORMAL LOW (ref 3.87–5.11)
RDW: 18.4 % — ABNORMAL HIGH (ref 11.5–15.5)
WBC: 5.4 K/uL (ref 4.0–10.5)
nRBC: 0 % (ref 0.0–0.2)

## 2024-01-21 LAB — BASIC METABOLIC PANEL WITH GFR
Anion gap: 10 (ref 5–15)
BUN: 17 mg/dL (ref 8–23)
CO2: 26 mmol/L (ref 22–32)
Calcium: 8.8 mg/dL — ABNORMAL LOW (ref 8.9–10.3)
Chloride: 103 mmol/L (ref 98–111)
Creatinine, Ser: 0.58 mg/dL (ref 0.44–1.00)
GFR, Estimated: >60 mL/min
Glucose, Bld: 97 mg/dL (ref 70–99)
Potassium: 4.1 mmol/L (ref 3.5–5.1)
Sodium: 139 mmol/L (ref 135–145)

## 2024-01-21 LAB — ECHOCARDIOGRAM COMPLETE
Area-P 1/2: 3.31 cm2
Height: 67.5 in
MV M vel: 4.75 m/s
MV Peak grad: 90.3 mmHg
S' Lateral: 3.6 cm
Weight: 3504 [oz_av]

## 2024-01-21 MED ORDER — METOPROLOL SUCCINATE ER 50 MG PO TB24: 25.0000 mg | ORAL_TABLET | Freq: Two times a day (BID) | ORAL | Status: AC

## 2024-01-21 NOTE — TOC Initial Note (Signed)
 Transition of Care [facility]) - Initial/Assessment Note    Patient Details  Name: Veronica Bradford MRN: [id number] Date of Birth: 14-Apr-1945  [facility] CM/SW Contact:    Veronica JULIANNA George, RN Phone Number: 01/21/2024, 11:11 AM  Clinical Narrative:                 [name] is a 79 year old female with history of paroxysmal atrial fibrillation, symptomatic atrial fibrillation, morbid obesity, history of pacemaker status with recent cardioversion 01/16/24, on flecainide , GERD, hyperlipidemia, on anticoagulation with Eliquis , IBS.   Pt is from home with her spouse. They are together all the time.  Pt denies issues with transportation or her home medications.   Pt has transportation home at d/c.  ICM following for further needs.   Expected Discharge Plan: Home/Self Care Barriers to Discharge: Continued Medical Work up   Patient Goals and CMS Choice            Expected Discharge Plan and Services   Discharge Planning Services: CM Consult   Living arrangements for the past 2 months: Single Family Home                                      Prior Living Arrangements/Services Living arrangements for the past 2 months: Single Family Home   Patient language and need for interpreter reviewed:: Yes Do you feel safe going back to the place where you live?: Yes        Care giver support system in place?: Yes (comment) Current home services: DME (cane/ CPAP) Criminal Activity/Legal Involvement Pertinent to Current Situation/Hospitalization: No - Comment as needed  Activities of Daily Living   ADL Screening (condition at time of admission) Independently performs ADLs?: Yes (appropriate for developmental age) Is the patient deaf or have difficulty hearing?: No Does the patient have difficulty seeing, even when wearing glasses/contacts?: No Does the patient have difficulty concentrating, remembering, or making decisions?: No  Permission Sought/Granted                   Emotional Assessment Appearance:: Appears stated age Attitude/Demeanor/Rapport: Engaged Affect (typically observed): Accepting Orientation: : Oriented to Self, Oriented to Place, Oriented to  Time, Oriented to Situation   Psych Involvement: No (comment)  Admission diagnosis:  Pulmonary nodule [R91.1] Nonintractable headache, unspecified chronicity pattern, unspecified headache type [R51.9] Dyspnea, unspecified type [R06.00] Acute on chronic heart failure with preserved ejection fraction (HFpEF) (HCC) [I50.33] Patient Active Problem List   Diagnosis Date Noted   Acute on chronic heart failure with preserved ejection fraction (HFpEF) (HCC) 01/19/2024   Atrial flutter (HCC) 12/20/2023   Symptomatic anemia 12/20/2023   Acute blood loss anemia 12/19/2023   Hyperparathyroidism, primary 09/03/2022   DOE (dyspnea on exertion) 05/04/2022   Paroxysmal A-fib (HCC) 01/03/2022   Chronic diastolic CHF (congestive heart failure) (HCC) 07/14/2021   Pacemaker 12/14/2020   RBBB 08/09/2020   PVC's (premature ventricular contractions) 08/09/2020   Sinus bradycardia - junctional rhythm 08/09/2020   Vitamin D  deficiency 01/20/2019   Multiple pulmonary nodules determined by computed tomography of lung 12/06/2017   Osteopenia 05/23/2017   Annual physical exam 04/14/2015   PCP NOTES >>>>> 12/23/2014   IBS (irritable bowel syndrome) 08/13/2014   Hyperglycemia    Chronic sinusitis    Endometrial cancer (HCC)    Back pain-- on tylenol  #3 prn    GERD (gastroesophageal reflux disease)  HTN (hypertension) 09/09/2013   Diastolic dysfunction-grade 2 09/09/2013   Chronic anticoagulation 09/09/2013   Edema of both legs 09/09/2013   Dyslipidemia 09/09/2013   Morbid obesity (HCC) 09/09/2013   OSA on CPAP 09/09/2013   PCP:  Amon Aloysius BRAVO, MD Pharmacy:   [facility] [phone] - [place], [place] - [address] AT [facility] [address] [place] [address] Phone: [phone]  Fax: [phone]     Social Drivers of Health (SDOH) Social History: SDOH Screenings   Food Insecurity: No Food Insecurity (01/19/2024)  Housing: Low Risk (01/19/2024)  Transportation Needs: No Transportation Needs (01/19/2024)  Utilities: Not At Risk (01/19/2024)  Alcohol  Screen: Low Risk (07/23/2023)  Depression (PHQ2-9): Medium Risk (12/28/2023)  Financial Resource Strain: Low Risk (07/23/2023)  Physical Activity: Insufficiently Active (07/23/2023)  Social Connections: Moderately Isolated (01/19/2024)  Stress: No Stress Concern Present (07/23/2023)  Tobacco Use: Medium Risk (01/19/2024)  Health Literacy: Adequate Health Literacy (06/27/2023)   SDOH Interventions:     Readmission Risk Interventions    12/01/2022    1:35 PM  Readmission Risk Prevention Plan  Transportation Screening Complete  PCP or Specialist Appt within 5-7 Days Complete  Home Care Screening Complete  Medication Review (RN CM) Complete

## 2024-01-21 NOTE — Progress Notes (Incomplete Revision)
 "  Electrophysiology Progress Note  Patient Name: Veronica Bradford Date of Encounter: 01/21/2024  Primary Cardiologist: NA Electrophysiologist: Dr. Kennyth   Subjective   Walked to the nurses station and back x2 this AM, no SOB, felt a little swimmy headed but not dizzy, not near syncopal. Head ache and neck pressure both resolved completely Feels near her good baseline.   Inpatient Medications    Scheduled Meds:  apixaban   5 mg Oral BID   colestipol   2 g Oral Q12H   Fe Fum-Vit C-Vit B12-FA  1 capsule Oral BID WC   fiber  1 packet Per Tube BID   flecainide   100 mg Oral BID   furosemide   40 mg Oral Daily   metoprolol  succinate  25 mg Oral BID   pantoprazole   40 mg Oral BID   pravastatin   40 mg Oral q1800   Continuous Infusions:   PRN Meds: acetaminophen  **OR** acetaminophen , hydrALAZINE , ondansetron  **OR** ondansetron  (ZOFRAN ) IV, senna-docusate   Vital Signs    Vitals:   01/21/24 0426 01/21/24 0741 01/21/24 0822 01/21/24 1037  BP: 126/69  (!) 137/55 (!) 125/54  Pulse: 65     Resp: 18  18   Temp: 98 F (36.7 C)  98.1 F (36.7 C)   TempSrc: Oral  Oral   SpO2: 95%     Weight:  99.3 kg    Height:        Intake/Output Summary (Last 24 hours) at 01/21/2024 1104 Last data filed at 01/20/2024 1738 Gross per 24 hour  Intake 632.5 ml  Output --  Net 632.5 ml   Filed Weights   01/18/24 1942 01/19/24 1312 01/21/24 0741  Weight: 100.2 kg 100.7 kg 99.3 kg    Physical Exam     GEN- The patient is well appearing, alert and oriented x 3 today.   Lungs- Clear to ausculation bilaterally, normal work of breathing Talking in full sentences Heart- Regular rate and rhythm, no murmurs, rubs or gallops Extremities- no clubbing, cyanosis, or edema Neuro- strength and sensation are intact  Labs      Telemetry    AV pacing predominantly (personally reviewed)  Radiology    No results found.    Patient Profile     Veronica Bradford is a 79 y.o. 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    She was admitted for shortness of breath, fatigue, and headache which have been worsening since DC cardioversion was performed on January 14 --Flecainide  was restarted at a higher dose, and programming changes were made to her device at that time.   Assessment & Plan     Acute on chronic CHF Patient presented with mild volume overload that seems to be progressing since cardioversion on July 14 She has also been experiencing headache, which rresolved with diuresis At that time, flecainide  100 mg twice daily was re-started and programming changes were made to the device TTE is still pending - if EF is reduced or there are structural abnormalities, we will need to discontinue flecainide  Dr. Nancey programmed her device to DDIR with a lower rate limit of 65 bpm and a paced AV delay of 200 ms (more similar to her pre-DCCV settings) -- this resulted in resolution of her headache and a funny feeling in her neck  Both headache and neck pressure remain resolved  She is predominantly AV paced without awareness/symptoms   Persistent  atrial fibrillation and flutter Continue flecainide  100mg  PO BID for now Device noted atrial high rate episodes prior to admission, but there were no available EGMs. Pacemaker has been reprogrammed to DDIR No AFib here On Eliquis , H/H looks stable S/p DCCV 01/16/24, continue OAC without interruption She owuld be interested in discussing watchman, perhaps re-do AFib/vs flutter ablation with Dr. 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 Nancey to see   ADEND: Echo is read Normal LVEF with no significant VHD OK to continue home flecainide , metoprolol , Eliquis  She has follow up in place with Dr.  Kennyth Charlies Arthur, PA-C  For questions or updates, please contact [facility] Please consult [email or web address] for contact info under Cardiology/STEMI.  Signed, Charlies Macario Arthur, PA-C  01/21/2024, 11:04 AM   "

## 2024-01-21 NOTE — Progress Notes (Signed)
 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  Recently underwent cardioversion 1/14, was started on flecainide  6 days prior. - Admitted in December for anemia, heme positive stools, improved with transfusion, EGD and colonoscopy were unremarkable -In the ED noted to be volume overloaded, mildly hypoxic placed on 2 L O2, sodium 137, creatinine 0.5, hemoglobin 11, proBNP 1768, troponin 15, 17 - Admitted, started on diuretics  Subjective: Feels much better, breathing is improving, concerned about her hemoglobin  Assessment and Plan:  Acute on chronic heart failure with preserved ejection fraction (HFpEF) (HCC) Presumed secondary to recent atrial fibrillation  -Improved with diuresis, appears close to euvolemic - Continue oral Lasix , - Cards following, FU ECHO - DC home today if echo is unremarkable  Iron  deficiency anemia - Hemoglobin in the 9.6-11.5 range over the last 1 month - EGD and colonoscopy in December were unremarkable for active bleeding - Anemia panel then noted iron  deficiency, given IV iron  yesterday - Follow-up with PCP, may benefit from periodic iron  infusions  Paroxysmal A-fib (HCC) Apixaban  5 mg BID, flecainide  100 mg BID, metoprolol  succinate 25 mg daily -in NSR now  Headache - Could be secondary to CHF, appears to have improved, monitor  RML nodule  Dyslipidemia Pravastatin  40 mg every evening resumed  Chronic anticoagulation Home apixaban  5 mg BID resumed  OSA on CPAP Cpap at bedtime ordered  Obesity (HCC) This complicates overall care and prognosis.,  Losing weight on weight watchers  HTN (hypertension) Home metoprolol  succinate 25 mg daily resumed   DVT prophylaxis: Eliquis  Code Status: Full code Family Communication: None present Disposition Plan: Home pending  echo  Consultants: Cards   Procedures:   Antimicrobials:    Objective: Vitals:   01/21/24 0741 01/21/24 0822 01/21/24 1037 01/21/24 1126  BP:  (!) 137/55 (!) 125/54 127/63  Pulse:      Resp:  18  18  Temp:  98.1 F (36.7 C)  98.6 F (37 C)  TempSrc:  Oral  Oral  SpO2:      Weight: 99.3 kg     Height:        Intake/Output Summary (Last 24 hours) at 01/21/2024 1158 Last data filed at 01/20/2024 1738 Gross per 24 hour  Intake 632.5 ml  Output --  Net 632.5 ml   Filed Weights   01/18/24 1942 01/19/24 1312 01/21/24 0741  Weight: 100.2 kg 100.7 kg 99.3 kg    Examination:  Gen: Awake, Alert, Oriented X 3, no distress HEENT: no JVD Lungs: Good air movement bilaterally, CTAB CVS: S1S2/RRR Abd: soft, Non tender, non distended, BS present Extremities: No edema, compression stockings on Skin: no new rashes on exposed skin     Data Reviewed:   CBC: Recent Labs  Lab 01/18/24 2025 01/20/24 0327 01/21/24 0359  WBC 10.6* 6.4 5.4  HGB 11.5* 9.7* 10.6*  HCT 35.1* 30.0* 32.3*  MCV 87.3 87.2 88.3  PLT 223 191 218   Basic Metabolic Panel: Recent Labs  Lab 01/18/24 2025 01/20/24 0327 01/21/24 0359  NA 137 140 139  K 4.1 3.6 4.1  CL 105 104 103  CO2 22 27 26   GLUCOSE 116* 96 97  BUN 14 15 17   CREATININE 0.55 0.65 0.58  CALCIUM  9.1 8.7* 8.8*   GFR: Estimated Creatinine Clearance: 73.1 mL/min (by C-G formula based on SCr of  0.58 mg/dL). Liver Function Tests: Recent Labs  Lab 01/18/24 2025  AST 65*  ALT 61*  ALKPHOS 91  BILITOT 0.7  PROT 6.3*  ALBUMIN 3.9   No results for input(s): LIPASE, AMYLASE in the last 168 hours. No results for input(s): AMMONIA in the last 168 hours. Coagulation Profile: No results for input(s): INR, PROTIME in the last 168 hours. Cardiac Enzymes: No results for input(s): CKTOTAL, CKMB, CKMBINDEX, TROPONINI in the last 168 hours. BNP (last 3 results) Recent Labs    01/18/24 2025  PROBNP 1,768.0*    HbA1C: No results for input(s): HGBA1C in the last 72 hours. CBG: No results for input(s): GLUCAP in the last 168 hours. Lipid Profile: No results for input(s): CHOL, HDL, LDLCALC, TRIG, CHOLHDL, LDLDIRECT in the last 72 hours. Thyroid  Function Tests: No results for input(s): TSH, T4TOTAL, FREET4, T3FREE, THYROIDAB in the last 72 hours. Anemia Panel: No results for input(s): VITAMINB12, FOLATE, FERRITIN, TIBC, IRON , RETICCTPCT in the last 72 hours. Urine analysis:    Component Value Date/Time   COLORURINE YELLOW 03/07/2023 1341   APPEARANCEUR CLEAR 03/07/2023 1341   LABSPEC 1.010 03/07/2023 1341   PHURINE 6.0 03/07/2023 1341   GLUCOSEU NEGATIVE 03/07/2023 1341   HGBUR NEGATIVE 03/07/2023 1341   BILIRUBINUR negative 03/22/2023 0835   BILIRUBINUR negative 11/10/2022 1528   KETONESUR negative 03/22/2023 0835   KETONESUR NEGATIVE 03/07/2023 1341   PROTEINUR negative 03/22/2023 0835   PROTEINUR NEGATIVE 11/29/2022 1235   UROBILINOGEN 0.2 03/22/2023 0835   UROBILINOGEN 0.2 03/07/2023 1341   NITRITE Negative 03/22/2023 0835   NITRITE NEGATIVE 03/07/2023 1341   LEUKOCYTESUR Trace (A) 03/22/2023 0835   LEUKOCYTESUR NEGATIVE 03/07/2023 1341   Sepsis Labs: @LABRCNTIP (procalcitonin:4,lacticidven:4)  ) Recent Results (from the past 240 hours)  Resp panel by RT-PCR (RSV, Flu A&B, Covid) Anterior Nasal Swab     Status: None   Collection Time: 01/18/24  7:44 PM   Specimen: Anterior Nasal Swab  Result Value Ref Range Status   SARS Coronavirus 2 by RT PCR NEGATIVE NEGATIVE Final    Comment: (NOTE) SARS-CoV-2 target nucleic acids are NOT DETECTED.  The SARS-CoV-2 RNA is generally detectable in upper respiratory specimens during the acute phase of infection. The lowest concentration of SARS-CoV-2 viral copies this assay can detect is 138 copies/mL. A negative result does not preclude SARS-Cov-2 infection and should not be used as the sole basis  for treatment or other patient management decisions. A negative result may occur with  improper specimen collection/handling, submission of specimen other than nasopharyngeal swab, presence of viral mutation(s) within the areas targeted by this assay, and inadequate number of viral copies(<138 copies/mL). A negative result must be combined with clinical observations, patient history, and epidemiological information. The expected result is Negative.  Fact Sheet for Patients:  bloggercourse.com  Fact Sheet for Healthcare Providers:  seriousbroker.it  This test is no t yet approved or cleared by the [place]  FDA and  has been authorized for detection and/or diagnosis of SARS-CoV-2 by FDA under an Emergency Use Authorization (EUA). This EUA will remain  in effect (meaning this test can be used) for the duration of the COVID-19 declaration under Section 564(b)(1) of the Act, 21 U.S.C.section 360bbb-3(b)(1), unless the authorization is terminated  or revoked sooner.       Influenza A by PCR NEGATIVE NEGATIVE Final   Influenza B by PCR NEGATIVE NEGATIVE Final    Comment: (NOTE) The Xpert Xpress SARS-CoV-2/FLU/RSV plus assay is intended as an aid in the diagnosis  of influenza from Nasopharyngeal swab specimens and should not be used as a sole basis for treatment. Nasal washings and aspirates are unacceptable for Xpert Xpress SARS-CoV-2/FLU/RSV testing.  Fact Sheet for Patients: bloggercourse.com  Fact Sheet for Healthcare Providers: seriousbroker.it  This test is not yet approved or cleared by the [place]  FDA and has been authorized for detection and/or diagnosis of SARS-CoV-2 by FDA under an Emergency Use Authorization (EUA). This EUA will remain in effect (meaning this test can be used) for the duration of the COVID-19 declaration under Section 564(b)(1) of the Act, 21  U.S.C. section 360bbb-3(b)(1), unless the authorization is terminated or revoked.     Resp Syncytial Virus by PCR NEGATIVE NEGATIVE Final    Comment: (NOTE) Fact Sheet for Patients: bloggercourse.com  Fact Sheet for Healthcare Providers: seriousbroker.it  This test is not yet approved or cleared by the [place]  FDA and has been authorized for detection and/or diagnosis of SARS-CoV-2 by FDA under an Emergency Use Authorization (EUA). This EUA will remain in effect (meaning this test can be used) for the duration of the COVID-19 declaration under Section 564(b)(1) of the Act, 21 U.S.C. section 360bbb-3(b)(1), unless the authorization is terminated or revoked.  Performed at [facility], [address] [place], [place] [address]      Radiology Studies: No results found.    Scheduled Meds:  apixaban   5 mg Oral BID   colestipol   2 g Oral Q12H   Fe Fum-Vit C-Vit B12-FA  1 capsule Oral BID WC   fiber  1 packet Per Tube BID   flecainide   100 mg Oral BID   furosemide   40 mg Oral Daily   metoprolol  succinate  25 mg Oral BID   pantoprazole   40 mg Oral BID   pravastatin   40 mg Oral q1800   Continuous Infusions:   LOS: 2 days    Time spent:    Sigurd Pac, MD [facility]   01/21/2024, 11:58 AM    "

## 2024-01-21 NOTE — Progress Notes (Signed)
 "  Electrophysiology Progress Note  Patient Name: Veronica Bradford Date of Encounter: 01/21/2024  Primary Cardiologist: NA Electrophysiologist: Dr. Kennyth   Subjective   Walked to the nurses station and back x2 this AM, no SOB, felt a little swimmy headed but not dizzy, not near syncopal. Head ache and neck pressure both resolved completely Feels near her good baseline.   Inpatient Medications    Scheduled Meds:  apixaban   5 mg Oral BID   colestipol   2 g Oral Q12H   Fe Fum-Vit C-Vit B12-FA  1 capsule Oral BID WC   fiber  1 packet Per Tube BID   flecainide   100 mg Oral BID   furosemide   40 mg Oral Daily   metoprolol  succinate  25 mg Oral BID   pantoprazole   40 mg Oral BID   pravastatin   40 mg Oral q1800   Continuous Infusions:   PRN Meds: acetaminophen  **OR** acetaminophen , hydrALAZINE , ondansetron  **OR** ondansetron  (ZOFRAN ) IV, senna-docusate   Vital Signs    Vitals:   01/21/24 0426 01/21/24 0741 01/21/24 0822 01/21/24 1037  BP: 126/69  (!) 137/55 (!) 125/54  Pulse: 65     Resp: 18  18   Temp: 98 F (36.7 C)  98.1 F (36.7 C)   TempSrc: Oral  Oral   SpO2: 95%     Weight:  99.3 kg    Height:        Intake/Output Summary (Last 24 hours) at 01/21/2024 1104 Last data filed at 01/20/2024 1738 Gross per 24 hour  Intake 632.5 ml  Output --  Net 632.5 ml   Filed Weights   01/18/24 1942 01/19/24 1312 01/21/24 0741  Weight: 100.2 kg 100.7 kg 99.3 kg    Physical Exam     GEN- The patient is well appearing, alert and oriented x 3 today.   Lungs- Clear to ausculation bilaterally, normal work of breathing Talking in full sentences Heart- Regular rate and rhythm, no murmurs, rubs or gallops Extremities- no clubbing, cyanosis, or edema Neuro- strength and sensation are intact  Labs      Telemetry    AV pacing predominantly (personally reviewed)  Radiology    No results found.    Patient Profile     Veronica Bradford is a 79 y.o. 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    She was admitted for shortness of breath, fatigue, and headache which have been worsening since DC cardioversion was performed on January 14 --Flecainide  was restarted at a higher dose, and programming changes were made to her device at that time.   Assessment & Plan     Acute on chronic CHF Patient presented with mild volume overload that seems to be progressing since cardioversion on July 14 She has also been experiencing headache, which rresolved with diuresis At that time, flecainide  100 mg twice daily was re-started and programming changes were made to the device TTE is still pending - if EF is reduced or there are structural abnormalities, we will need to discontinue flecainide  Dr. Nancey programmed her device to DDIR with a lower rate limit of 65 bpm and a paced AV delay of 200 ms (more similar to her pre-DCCV settings) -- this resulted in resolution of her headache and a funny feeling in her neck  Both headache and neck pressure remain resolved  She is predominantly AV paced without awareness/symptoms   Persistent  atrial fibrillation and flutter Continue flecainide  100mg  PO BID for now Device noted atrial high rate episodes prior to admission, but there were no available EGMs. Pacemaker has been reprogrammed to DDIR No AFib here On Eliquis , H/H looks stable S/p DCCV 01/16/24, continue OAC without interruption She owuld be interested in discussing watchman, perhaps re-do AFib/vs flutter ablation with Dr. 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 Nancey to see   For questions or updates, please contact [facility] Please consult [email or web address] for contact info under Cardiology/STEMI.  Signed, Charlies Macario Arthur, PA-C  01/21/2024, 11:04 AM   "

## 2024-01-21 NOTE — Progress Notes (Signed)
 Heart Failure Navigator Progress Note  Assessed for Heart & Vascular TOC clinic readiness.  Patient does not need HF TOC appt per Dr. Fairy.   Navigator available for reassessment of patient.   Duwaine Plant, PharmD, BCPS Heart Failure Stewardship Pharmacist Phone [phone]

## 2024-01-21 NOTE — Progress Notes (Signed)
 Discharge Nurse Summary: DC order noted per MD. DC RN at bedside with patient. Patient agreeable with discharge plan, states family will arrive soon for pickup. AVS printed/reviewed with inclusion of HF Action Plan and HF Eating Plan for continued education. PIVs removed, skin intact. No DME needs. No home/TOC meds. CP/Edu resolved. Telemonitor returned to charging station. All belongings accounted for. Patient wheeled downstairs for discharge by private auto.   Rosario EMERSON Lund, RN

## 2024-01-21 NOTE — Progress Notes (Signed)
 Echocardiogram 2D Echocardiogram has been performed.  Veronica Bradford 01/21/2024, 2:38 PM

## 2024-01-22 ENCOUNTER — Telehealth: Payer: Self-pay | Admitting: *Deleted

## 2024-01-22 NOTE — Discharge Summary (Signed)
 Physician Discharge Summary  ANAYELY CONSTANTINE FMW:[id number] DOB: [birth year] DOA: 01/18/2024  PCP: Amon Aloysius BRAVO, MD  Admit date: 01/18/2024 Discharge date: 01/21/2024  Time spent: 45 minutes  Recommendations for Outpatient Follow-up:  PCP in 1 week, check CBC and BMP at follow-up Monitor iron  stores, may need periodic iron  infusions   Discharge Diagnoses:  Principal Problem:   Acute on chronic heart failure with preserved ejection fraction (HFpEF) (HCC) Iron  deficiency anemia Paroxysmal atrial fibrillation   Chronic anticoagulation   Dyslipidemia   Morbid obesity (HCC)   OSA on CPAP   GERD (gastroesophageal reflux disease)   HTN (hypertension)   IBS (irritable bowel syndrome)   RBBB   Pacemaker   DOE (dyspnea on exertion)   Discharge Condition: Improved  Diet recommendation: Heart healthy  Filed Weights   01/18/24 1942 01/19/24 1312 01/21/24 0741  Weight: 100.2 kg 100.7 kg 99.3 kg    History of present illness:  79/F with paroxysmal A-fib, PPM on Eliquis , diastolic CHF, obesity admitted 1/17 with headache and dyspnea.  Recently underwent cardioversion 1/14, was started on flecainide  6 days prior. - Admitted in December for anemia, heme positive stools, improved with transfusion, EGD and colonoscopy were unremarkable -In the ED noted to be volume overloaded, mildly hypoxic placed on 2 L O2, sodium 137, creatinine 0.5, hemoglobin 11, proBNP 1768, troponin 15, 17 - Admitted, started on diuretics  Hospital Course:   Acute on chronic heart failure with preserved ejection fraction (HFpEF) (HCC) Presumed secondary to recent atrial fibrillation  -Improved with diuresis, now appears euvolemic, resumed oral Lasix  -Repeat echo with EF 60-65%, normal RV, moderately elevated PASP - Continue metoprolol  -Consider SGLT2i at follow-up -[facility] in 2 weeks   Iron  deficiency anemia - Hemoglobin in the 9.6-11.5 range over the last 1 month - EGD and colonoscopy in December were  unremarkable for active bleeding - Anemia panel then noted iron  deficiency, given IV iron   - Follow-up with PCP, may benefit from periodic iron  infusions   Paroxysmal A-fib (HCC) Apixaban  5 mg BID, flecainide  100 mg BID, metoprolol  succinate 25 mg daily -in NSR now   Headache - Could be secondary to CHF, appears to have improved, monitor   RML nodule   Dyslipidemia Pravastatin  40 mg every evening resumed   Chronic anticoagulation Home apixaban  5 mg BID resumed   OSA on CPAP Cpap at bedtime ordered   Obesity (HCC) This complicates overall care and prognosis.,  Losing weight on weight watchers   HTN (hypertension) Home metoprolol  succinate 25 mg daily resumed  Discharge Exam: Vitals:   01/21/24 1037 01/21/24 1126  BP: (!) 125/54 127/63  Pulse:    Resp:  18  Temp:  98.6 F (37 C)  SpO2:     Gen: Awake, Alert, Oriented X 3,  HEENT: no JVD Lungs: Good air movement bilaterally, CTAB CVS: S1S2/RRR Abd: soft, Non tender, non distended, BS present Extremities: No edema Skin: no new rashes on exposed skin   Discharge Instructions    Allergies as of 01/21/2024       Reactions   Ibuprofen Other (See Comments)   Pt is on Xarelto    Nsaids Other (See Comments)   Pt on Xarelto    Tolmetin Other (See Comments)   Pt on Xarelto    Zoster Vaccine Live Other (See Comments)   Serum sickness, high fever, neck pain    Tape Other (See Comments)   Causes blisters   Amoxicillin Rash   Lactose Intolerance (gi) Diarrhea, Nausea And Vomiting  Lisinopril Cough   Penicillins Rash   Local reaction        Medication List     TAKE these medications    acetaminophen  500 MG tablet Commonly known as: TYLENOL  Take 500 mg by mouth every 6 (six) hours as needed for moderate pain or headache.   acetaminophen -codeine  300-30 MG tablet Commonly known as: TYLENOL  #3 Take 0.5-1 tablets by mouth every 6 (six) hours as needed for moderate pain (pain score 4-6).   apixaban  5 MG Tabs  tablet Commonly known as: ELIQUIS  Take 1 tablet (5 mg total) by mouth 2 (two) times daily.   ARTIFICIAL TEAR SOLUTION OP Place 1 drop into both eyes 4 (four) times daily as needed (dry/irritated eyes.).   Benefiber Powd Take 1 Scoop by mouth in the morning. Mix one scoop of powder with morning coffee and drink daily.   Benefiber Chew Chew 2 each by mouth every evening.   colestipol  1 g tablet Commonly known as: COLESTID  Take 2 g by mouth 2 (two) times daily.   CoQ10 200 MG Caps Take 200 mg by mouth daily.   CRANBERRY EXTRACT PO Take 1 tablet by mouth 2 (two) times daily.   estradiol  0.1 MG/GM vaginal cream Commonly known as: ESTRACE  Place 1 Applicatorful vaginally 3 (three) times a week.   flecainide  100 MG tablet Commonly known as: TAMBOCOR  Take 1 tablet (100 mg total) by mouth 2 (two) times daily.   fluticasone  50 MCG/ACT nasal spray Commonly known as: FLONASE  Place 1 spray into both nostrils daily.   furosemide  40 MG tablet Commonly known as: LASIX  TAKE 1 TABLET BY MOUTH AS NEEDED What changed: how much to take   Hemocyte Plus 106-1 MG Caps Take 1 capsule by mouth 2 (two) times daily.   MAGNESIUM  GLYCINATE PO Take 1,000 mg by mouth See admin instructions. Take one tablet by mouth twice a week   metoprolol  succinate 50 MG 24 hr tablet Commonly known as: TOPROL -XL Take 0.5 tablets (25 mg total) by mouth 2 (two) times daily. Take with or immediately following a meal. What changed:  how much to take when to take this   pantoprazole  40 MG tablet Commonly known as: PROTONIX  Take 1 tablet (40 mg total) by mouth 2 (two) times daily.   potassium chloride  SA 20 MEQ tablet Commonly known as: KLOR-CON  M Take 1 tablet (20 mEq total) by mouth daily.   pravastatin  40 MG tablet Commonly known as: PRAVACHOL  Take 1 tablet (40 mg total) by mouth every evening.   SENIOR MULTIVITAMIN PLUS PO Take 1 tablet by mouth in the morning.   sucralfate  1 g tablet Commonly  known as: CARAFATE  Take 1 g by mouth 2 (two) times daily as needed (upset stomach).   SUPER B COMPLEX/VITAMIN C PO Take 1 tablet by mouth daily.   Vitamin D3 50 MCG (2000 UT) capsule Take 2,000 Units by mouth daily.   Zepbound  7.5 MG/0.5ML Pen Generic drug: tirzepatide  Inject 7.5 mg into the skin once a week. What changed: when to take this       Allergies[1]    The results of significant diagnostics from this hospitalization (including imaging, microbiology, ancillary and laboratory) are listed below for reference.    Significant Diagnostic Studies: ECHOCARDIOGRAM COMPLETE Result Date: 01/21/2024    ECHOCARDIOGRAM REPORT   Patient Name:   RONIT CRANFIELD [facility] Date of Exam: 01/21/2024 Medical Rec #:  [id number]     Height:       67.5 in Accession #:  [id number]    Weight:       219.0 lb Date of Birth:  1945-09-26    BSA:          2.113 m Patient Age:    78 years      BP:           127/63 mmHg Patient Gender: F             HR:           65 bpm. Exam Location:  Inpatient Procedure: 2D Echo, Cardiac Doppler and Color Doppler (Both Spectral and Color            Flow Doppler were utilized during procedure). Indications:    Dyspnea R06  History:        Patient has prior history of Echocardiogram examinations, most                 recent 02/11/2021. CHF, Pacemaker, Arrythmias:Atrial                 Fibrillation; Risk Factors:Hypertension, Dyslipidemia and Former                 Smoker.  Sonographer:    Merlynn Argyle Referring Phys: [id number] AMY N COX  Sonographer Comments: Image acquisition challenging due to patient body habitus and Image acquisition challenging due to respiratory motion. IMPRESSIONS  1. Left ventricular ejection fraction, by estimation, is 60 to 65%. The left ventricle has normal function. The left ventricle has no regional wall motion abnormalities. Left ventricular diastolic parameters were normal.  2. Right ventricular systolic function is normal. The right ventricular size is normal.  There is moderately elevated pulmonary artery systolic pressure.  3. Left atrial size was moderately dilated.  4. Right atrial size was mildly dilated.  5. The mitral valve is abnormal. Mild mitral valve regurgitation. No evidence of mitral stenosis.  6. The aortic valve is tricuspid. There is mild calcification of the aortic valve. There is mild thickening of the aortic valve. Aortic valve regurgitation is not visualized. Aortic valve sclerosis is present, with no evidence of aortic valve stenosis.  7. The inferior vena cava is normal in size with greater than 50% respiratory variability, suggesting right atrial pressure of 3 mmHg. FINDINGS  Left Ventricle: Left ventricular ejection fraction, by estimation, is 60 to 65%. The left ventricle has normal function. The left ventricle has no regional wall motion abnormalities. Strain was performed and the global longitudinal strain is indeterminate. The left ventricular internal cavity size was normal in size. There is no left ventricular hypertrophy. Left ventricular diastolic parameters were normal. Right Ventricle: The right ventricular size is normal. No increase in right ventricular wall thickness. Right ventricular systolic function is normal. There is moderately elevated pulmonary artery systolic pressure. The tricuspid regurgitant velocity is 2.86 m/s, and with an assumed right atrial pressure of 15 mmHg, the estimated right ventricular systolic pressure is 47.7 mmHg. Left Atrium: Left atrial size was moderately dilated. Right Atrium: Right atrial size was mildly dilated. Pericardium: There is no evidence of pericardial effusion. Mitral Valve: The mitral valve is abnormal. There is mild thickening of the mitral valve leaflet(s). There is mild calcification of the mitral valve leaflet(s). Mild mitral valve regurgitation. No evidence of mitral valve stenosis. Tricuspid Valve: The tricuspid valve is normal in structure. Tricuspid valve regurgitation is mild . No  evidence of tricuspid stenosis. Aortic Valve: The aortic valve is tricuspid. There is mild calcification of the aortic valve. There is  mild thickening of the aortic valve. Aortic valve regurgitation is not visualized. Aortic valve sclerosis is present, with no evidence of aortic valve stenosis. Pulmonic Valve: The pulmonic valve was normal in structure. Pulmonic valve regurgitation is not visualized. No evidence of pulmonic stenosis. Aorta: The aortic root is normal in size and structure. Venous: The inferior vena cava is normal in size with greater than 50% respiratory variability, suggesting right atrial pressure of 3 mmHg. IAS/Shunts: No atrial level shunt detected by color flow Doppler. Additional Comments: 3D was performed not requiring image post processing on an independent workstation and was indeterminate.  LEFT VENTRICLE PLAX 2D LVIDd:         5.50 cm LVIDs:         3.60 cm LV PW:         0.90 cm LV IVS:        0.90 cm LVOT diam:     1.90 cm LV SV:         77 LV SV Index:   36 LVOT Area:     2.84 cm  RIGHT VENTRICLE             IVC RV S prime:     12.80 cm/s  IVC diam: 2.50 cm TAPSE (M-mode): 2.4 cm LEFT ATRIUM             Index        RIGHT ATRIUM           Index LA diam:        4.90 cm 2.32 cm/m   RA Area:     23.00 cm LA Vol (A2C):   75.5 ml 35.72 ml/m  RA Volume:   70.70 ml  33.45 ml/m LA Vol (A4C):   66.7 ml 31.56 ml/m LA Biplane Vol: 74.7 ml 35.35 ml/m  AORTIC VALVE LVOT Vmax:   123.00 cm/s LVOT Vmean:  89.000 cm/s LVOT VTI:    0.271 m  AORTA Ao Root diam: 3.40 cm Ao Asc diam:  3.40 cm MITRAL VALVE                TRICUSPID VALVE MV Area (PHT): 3.31 cm     TR Peak grad:   32.7 mmHg MV Decel Time: 229 msec     TR Vmax:        286.00 cm/s MR Peak grad: 90.2 mmHg MR Mean grad: 64.5 mmHg     SHUNTS MR Vmax:      475.00 cm/s   Systemic VTI:  0.27 m MR Vmean:     383.0 cm/s    Systemic Diam: 1.90 cm MV E velocity: 106.00 cm/s MV A velocity: 40.80 cm/s MV E/A ratio:  2.60 Maude Emmer MD  Electronically signed by Maude Emmer MD Signature Date/Time: 01/21/2024/3:40:52 PM    Final    CT Angio Chest PE W and/or Wo Contrast Result Date: 01/18/2024 EXAM: CTA CHEST 01/18/2024 10:25:00 PM TECHNIQUE: CTA of the chest was performed without and with the administration of 75 mL iohexol  (OMNIPAQUE ) 350 MG/ML injection. Multiplanar reformatted images are provided for review. MIP images are provided for review. Automated exposure control, iterative reconstruction, and/or weight based adjustment of the mA/kV was utilized to reduce the radiation dose to as low as reasonably achievable. COMPARISON: None available. CLINICAL HISTORY: Shortness of breath and chest pain. FINDINGS: PULMONARY ARTERIES: The pulmonary artery shows a normal branching pattern. No filling defect to suggest pulmonary embolism is seen. Main pulmonary artery is normal in caliber. MEDIASTINUM: The heart  is at the upper limits of normal in size. Coronary calcifications are noted. The thoracic aorta shows atherosclerotic calcifications without aneurysmal dilatation. The esophagus, as visualized, is within normal limits. LYMPH NODES: No mediastinal, hilar or axillary lymphadenopathy. LUNGS AND PLEURA: Small bilateral pleural effusions are noted. Changes of pulmonary edema are seen. No focal infiltrate or sizable effusion is noted. A 5 mm nodule is noted in the lateral aspect of the right middle lobe. No follow-up is recommended. No pneumothorax. UPPER ABDOMEN: Limited images of the upper abdomen are unremarkable. SOFT TISSUES AND BONES: Degenerative changes of the thoracic spine are seen. No acute soft tissue abnormality. IMPRESSION: 1. No pulmonary embolism. 2. Small bilateral pleural effusions and changes of pulmonary edema. No focal infiltrate or sizable effusion. 3. 5 mm nodule in the right middle lobe. No routine follow-up imaging is recommended per Fleischner Society Guidelines. Electronically signed by: Oneil Devonshire MD 01/18/2024 10:46 PM EST  RP Workstation: [id number]   CT Head Wo Contrast Result Date: 01/18/2024 CLINICAL DATA:  New onset headache EXAM: CT HEAD WITHOUT CONTRAST TECHNIQUE: Contiguous axial images were obtained from the base of the skull through the vertex without intravenous contrast. RADIATION DOSE REDUCTION: This exam was performed according to the departmental dose-optimization program which includes automated exposure control, adjustment of the mA and/or kV according to patient size and/or use of iterative reconstruction technique. COMPARISON:  CT brain 11/22/2022 FINDINGS: Brain: No acute territorial infarction, hemorrhage or intracranial mass. The ventricles are nonenlarged. Vascular: No hyperdense vessels.  Carotid vascular calcification Skull: Normal. Negative for fracture or focal lesion. Sinuses/Orbits: No acute finding. Other: None IMPRESSION: No CT evidence for acute intracranial abnormality Electronically Signed   By: Luke Bun M.D.   On: 01/18/2024 21:17   DG Chest 2 View Result Date: 01/18/2024 EXAM: 2 VIEW(S) XRAY OF THE CHEST 01/18/2024 09:02:00 PM COMPARISON: 12/18/2023 CLINICAL HISTORY: SOB FINDINGS: LINES, TUBES AND DEVICES: Left pacer is unchanged. LUNGS AND PLEURA: Small bilateral pleural effusions. Bibasilar atelectasis. Interstitial prominence throughout the lungs, this could reflect early interstitial edema. No focal pulmonary opacity. No pneumothorax. HEART AND MEDIASTINUM: No acute abnormality of the cardiac and mediastinal silhouettes. BONES AND SOFT TISSUES: No acute osseous abnormality. IMPRESSION: 1. Interstitial prominence throughout the lungs, which may reflect early interstitial edema. 2. Small bilateral pleural effusions with bibasilar atelectasis. Electronically signed by: Franky Crease MD 01/18/2024 09:08 PM EST RP Workstation: [id number]   EP STUDY Result Date: 01/16/2024 See surgical note for result.  CUP PACEART INCLINIC DEVICE CHECK Result Date: 01/01/2024 Normal in-clinic dual  chamber pacemaker check. Presenting Rhythm: Afib/VS-VP. Routine testing of thresholds, sensing, and impedance demonstrate stable parameters and no programming changes needed at this time. On going AF/AFL since 12/18/23. Estimated longevity 6.4 - 8.0 years. Pt enrolled in remote follow-up.Bard Silvan, BSN, RN   Microbiology: Recent Results (from the past 240 hours)  Resp panel by RT-PCR (RSV, Flu A&B, Covid) Anterior Nasal Swab     Status: None   Collection Time: 01/18/24  7:44 PM   Specimen: Anterior Nasal Swab  Result Value Ref Range Status   SARS Coronavirus 2 by RT PCR NEGATIVE NEGATIVE Final    Comment: (NOTE) SARS-CoV-2 target nucleic acids are NOT DETECTED.  The SARS-CoV-2 RNA is generally detectable in upper respiratory specimens during the acute phase of infection. The lowest concentration of SARS-CoV-2 viral copies this assay can detect is 138 copies/mL. A negative result does not preclude SARS-Cov-2 infection and should not be used as the sole basis for treatment  or other patient management decisions. A negative result may occur with  improper specimen collection/handling, submission of specimen other than nasopharyngeal swab, presence of viral mutation(s) within the areas targeted by this assay, and inadequate number of viral copies(<138 copies/mL). A negative result must be combined with clinical observations, patient history, and epidemiological information. The expected result is Negative.  Fact Sheet for Patients:  bloggercourse.com  Fact Sheet for Healthcare Providers:  seriousbroker.it  This test is no t yet approved or cleared by the [place]  FDA and  has been authorized for detection and/or diagnosis of SARS-CoV-2 by FDA under an Emergency Use Authorization (EUA). This EUA will remain  in effect (meaning this test can be used) for the duration of the COVID-19 declaration under Section 564(b)(1) of the Act,  21 U.S.C.section 360bbb-3(b)(1), unless the authorization is terminated  or revoked sooner.       Influenza A by PCR NEGATIVE NEGATIVE Final   Influenza B by PCR NEGATIVE NEGATIVE Final    Comment: (NOTE) The Xpert Xpress SARS-CoV-2/FLU/RSV plus assay is intended as an aid in the diagnosis of influenza from Nasopharyngeal swab specimens and should not be used as a sole basis for treatment. Nasal washings and aspirates are unacceptable for Xpert Xpress SARS-CoV-2/FLU/RSV testing.  Fact Sheet for Patients: bloggercourse.com  Fact Sheet for Healthcare Providers: seriousbroker.it  This test is not yet approved or cleared by the [place]  FDA and has been authorized for detection and/or diagnosis of SARS-CoV-2 by FDA under an Emergency Use Authorization (EUA). This EUA will remain in effect (meaning this test can be used) for the duration of the COVID-19 declaration under Section 564(b)(1) of the Act, 21 U.S.C. section 360bbb-3(b)(1), unless the authorization is terminated or revoked.     Resp Syncytial Virus by PCR NEGATIVE NEGATIVE Final    Comment: (NOTE) Fact Sheet for Patients: bloggercourse.com  Fact Sheet for Healthcare Providers: seriousbroker.it  This test is not yet approved or cleared by the [place]  FDA and has been authorized for detection and/or diagnosis of SARS-CoV-2 by FDA under an Emergency Use Authorization (EUA). This EUA will remain in effect (meaning this test can be used) for the duration of the COVID-19 declaration under Section 564(b)(1) of the Act, 21 U.S.C. section 360bbb-3(b)(1), unless the authorization is terminated or revoked.  Performed at [facility], [address] [place], [place] [address]      Labs: Basic Metabolic Panel: Recent Labs  Lab 01/18/24 2025 01/20/24 0327 01/21/24 0359  NA 137 140 139  K 4.1 3.6  4.1  CL 105 104 103  CO2 22 27 26   GLUCOSE 116* 96 97  BUN 14 15 17   CREATININE 0.55 0.65 0.58  CALCIUM  9.1 8.7* 8.8*   Liver Function Tests: Recent Labs  Lab 01/18/24 2025  AST 65*  ALT 61*  ALKPHOS 91  BILITOT 0.7  PROT 6.3*  ALBUMIN 3.9   No results for input(s): LIPASE, AMYLASE in the last 168 hours. No results for input(s): AMMONIA in the last 168 hours. CBC: Recent Labs  Lab 01/18/24 2025 01/20/24 0327 01/21/24 0359  WBC 10.6* 6.4 5.4  HGB 11.5* 9.7* 10.6*  HCT 35.1* 30.0* 32.3*  MCV 87.3 87.2 88.3  PLT 223 191 218   Cardiac Enzymes: No results for input(s): CKTOTAL, CKMB, CKMBINDEX, TROPONINI in the last 168 hours. BNP: BNP (last 3 results) No results for input(s): BNP in the last 8760 hours.  ProBNP (last 3 results) Recent Labs    01/18/24  2025  PROBNP 1,768.0*    CBG: No results for input(s): GLUCAP in the last 168 hours.     Signed:  Sigurd Pac MD.  [facility] 01/22/2024, 2:11 PM    01/22/2024    [1]  Allergies Allergen Reactions   Ibuprofen Other (See Comments)    Pt is on Xarelto    Nsaids Other (See Comments)    Pt on Xarelto    Tolmetin Other (See Comments)    Pt on Xarelto    Zoster Vaccine Live Other (See Comments)    Serum sickness, high fever, neck pain    Tape Other (See Comments)    Causes blisters    Amoxicillin Rash   Lactose Intolerance (Gi) Diarrhea and Nausea And Vomiting   Lisinopril Cough   Penicillins Rash    Local reaction

## 2024-01-22 NOTE — Transitions of Care (Post Inpatient/ED Visit) (Signed)
 "  01/22/2024  Name: Veronica Bradford MRN: [id number] DOB: [birth year]  Today's TOC FU Call Status: Today's TOC FU Call Status:: Successful TOC FU Call Completed TOC FU Call Complete Date: 01/22/24  Patient's Name and Date of Birth confirmed. Name, DOB  Transition Care Management Follow-up Telephone Call Date of Discharge: 01/21/24 Discharge Facility: [facility]) Type of Discharge: Inpatient Admission Primary Inpatient Discharge Diagnosis:: Acute on chronic heart failure with preserved ejection fraction( How have you been since you were released from the hospital?: Better Any questions or concerns?: No  Items Reviewed: Did you receive and understand the discharge instructions provided?: Yes Medications obtained,verified, and reconciled?: Yes (Medications Reviewed) Any new allergies since your discharge?: No Dietary orders reviewed?: No Do you have support at home?: Yes People in Home [RPT]: spouse Name of Support/Comfort Primary Source: Alm  Medications Reviewed Today: Medications Reviewed Today     Reviewed by Kennieth Cathlean DEL, RN (Case Manager) on 01/22/24 at 1157  Med List Status: <None>   Medication Order Taking? Sig Documenting Provider Last Dose Status Informant  acetaminophen  (TYLENOL ) 500 MG tablet [id number] Yes Take 500 mg by mouth every 6 (six) hours as needed for moderate pain or headache. [provider]  Active Self, Pharmacy Records  acetaminophen -codeine  (TYLENOL  #3) 300-30 MG tablet [id number] Yes Take 0.5-1 tablets by mouth every 6 (six) hours as needed for moderate pain (pain score 4-6). [provider]  Active Self, Pharmacy Records  apixaban  (ELIQUIS ) 5 MG TABS tablet [id number] Yes Take 1 tablet (5 mg total) by mouth 2 (two) times daily. Sebastian Toribio GAILS, MD  Active Self, Pharmacy Records  ARTIFICIAL TEAR SOLUTION OP [id number] Yes Place 1 drop into both eyes 4 (four) times daily as needed (dry/irritated eyes.). [provider]   Active Self, Pharmacy Records  B Complex-C (SUPER B COMPLEX/VITAMIN C PO) [id number] Yes Take 1 tablet by mouth daily. [provider]  Active Self, Pharmacy Records  Cholecalciferol  (VITAMIN D3) 50 MCG (2000 UT) capsule [id number] Yes Take 2,000 Units by mouth daily. [provider]  Active Self, Pharmacy Records  Coenzyme Q10 (COQ10) 200 MG CAPS [id number] Yes Take 200 mg by mouth daily. [provider]  Active Self, Pharmacy Records  colestipol  (COLESTID ) 1 g tablet [id number] Yes Take 2 g by mouth 2 (two) times daily. [provider]  Active Self, Pharmacy Records  CRANBERRY EXTRACT PO [id number] Yes Take 1 tablet by mouth 2 (two) times daily. [provider]  Active Self, Pharmacy Records  estradiol  (ESTRACE ) 0.1 MG/GM vaginal cream [id number] Yes Place 1 Applicatorful vaginally 3 (three) times a week. Antonio Cyndee Jamee JONELLE, DO  Active Self, Pharmacy Records  Fe Fum-FA-B Cmp-C-Zn-Mg-Mn-Cu (HEMOCYTE PLUS) 106-1 MG CAPS [id number] Yes Take 1 capsule by mouth 2 (two) times daily. [provider]  Active Self, Pharmacy Records  flecainide  (TAMBOCOR ) 100 MG tablet [id number] Yes Take 1 tablet (100 mg total) by mouth 2 (two) times daily. Kennyth Chew, MD  Active Self, Pharmacy Records  fluticasone  (FLONASE ) 50 MCG/ACT nasal spray [id number] Yes Place 1 spray into both nostrils daily. [provider]  Active Self, Pharmacy Records  furosemide  (LASIX ) 40 MG tablet [id number] Yes TAKE 1 TABLET BY MOUTH AS NEEDED  Patient taking differently: Take 20 mg by mouth as needed.   Fernande Elspeth BROCKS, MD  Active Self, Pharmacy Records  MAGNESIUM  GLYCINATE PO [id number] Yes Take 1,000 mg by mouth See admin instructions. Take one tablet by mouth twice a week [provider]  Active Self, Pharmacy Records  metoprolol  succinate (TOPROL -XL) 50 MG 24 hr tablet [id number] Yes Take 0.5 tablets (25 mg total) by mouth 2 (two) times daily. Take with or immediately  following a meal. Fairy Frames, MD  Active   Multiple Vitamins-Minerals (SENIOR MULTIVITAMIN PLUS PO) [id number] Yes Take 1 tablet by mouth in the morning. [provider]  Active Self, Pharmacy Records  pantoprazole  (PROTONIX ) 40 MG tablet [id number] Yes Take 1 tablet (40 mg total) by mouth 2 (two) times daily. Dow Arland BROCKS, NP  Active Self, Pharmacy Records  potassium chloride  SA (KLOR-CON  M) 20 MEQ tablet [id number] Yes Take 1 tablet (20 mEq total) by mouth daily. Terra Fairy PARAS, PA-C  Active Self, Pharmacy Records  pravastatin  (PRAVACHOL ) 40 MG tablet [id number] Yes Take 1 tablet (40 mg total) by mouth every evening. Kennyth Chew, MD  Active Self, Pharmacy Records  sucralfate  (CARAFATE ) 1 g tablet [id number] Yes Take 1 g by mouth 2 (two) times daily as needed (upset stomach). [provider]  Active Self, Pharmacy Records  tirzepatide  (ZEPBOUND ) 7.5 MG/0.5ML Pen [id number] Yes Inject 7.5 mg into the skin once a week.  Patient taking differently: Inject 7.5 mg into the skin every Sunday.   Paz, Jose E, MD  Active Self, Pharmacy Records           Med Note ROSELEE, TEENA BRAVO   Dju Jan 19, 2024  5:37 PM) Usually take on Sundays, however taken this past Wednesday because was on hold for procedure  Wheat Dextrin [facility]) CHEW [id number] Yes Chew 2 each by mouth every evening. [provider]  Active Self, Pharmacy Records  Wheat Dextrin [facility]) POWD [id number] Yes Take 1 Scoop by mouth in the morning. Mix one scoop of powder with morning coffee and drink daily. [provider]  Active Self, Pharmacy Records            Home Care and Equipment/Supplies: Were Home Health Services Ordered?: NA Any new equipment or medical supplies ordered?: NA  Functional Questionnaire: Do you need assistance with bathing/showering or dressing?: No Do you need assistance with meal preparation?: No Do you need assistance with eating?: No Do you have difficulty  maintaining continence: No Do you need assistance with getting out of bed/getting out of a chair/moving?: No Do you have difficulty managing or taking your medications?: No  Follow up appointments reviewed: PCP Follow-up appointment confirmed?: Yes Date of PCP follow-up appointment?: 01/28/24 Follow-up Provider: [facility] Follow-up appointment confirmed?: NA Do you need transportation to your follow-up appointment?: No Do you understand care options if your condition(s) worsen?: Yes-patient verbalized understanding  SDOH Interventions Today    Flowsheet Row Most Recent Value  SDOH Interventions   Food Insecurity Interventions Intervention Not Indicated  Housing Interventions Intervention Not Indicated  Transportation Interventions Intervention Not Indicated, Patient Resources (Friends/Family)  Utilities Interventions Intervention Not Indicated  Depression Interventions/Treatment  PHQ2-9 Score <4 Follow-up Not Indicated   Discussed and offered 30 day TOC program.  Patient declined.The patient has been provided with contact information for the care management team and has been advised to call with any health -related questions or concerns.  The patient verbalized understanding with current plan of care.  The patient is directed to their insurance card regarding availability of benefits coverage   Per patient she is weighing daily and monitoring fluid intake  Cathlean Headland BSN RN [employer] [facility] Health Care Management Coordinator [email or web address] Direct Dial: [phone]  Fax: [phone] Website: [email or web address]  "

## 2024-01-23 ENCOUNTER — Ambulatory Visit: Payer: Self-pay

## 2024-01-23 NOTE — Telephone Encounter (Signed)
 Appt scheduled

## 2024-01-23 NOTE — Telephone Encounter (Signed)
 Was hospitalized over the last weekend with CHF and acute pulmonary edema, discharged Monday. Hgb 10 on blood thinners. Has hospital f/u appt on Monday, however reports her stools are dark brown and not dark green, and she is concerned for bleeding. Requesting repeat H&H. (Pt does take oral iron  supplements. 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  Patient was last seen in primary care on 12/28/2023 by Frann Mabel Mt, DO.  Called Nurse Triage reporting Dark stool.  Symptoms began several days ago.  Interventions attempted: Nothing.  Symptoms are: stable.  Triage Disposition: Home Care  Patient/caregiver understands and will follow disposition?: No, wishes to speak with PCP   Reason for Disposition  Unusual stool color probably from food or medicine  Answer Assessment - Initial Assessment Questions 1. COLOR: What color is your stool? Is that color in part or all of the stool? (e.g., black, clay-colored, green, red)      Dark brown, not dark green.   2. ONSET: When did you first notice this color change?     Monday  3. CAUSE: Have you eaten any food or taken any medicine of this color? Note: See listing in Background Information section.      Takes iron  but is concerned for bleeding, as she takes a blood thinner.   4. OTHER SYMPTOMS: Do you have any other symptoms? (e.g., abdomen pain, diarrhea, fever, yellow eyes or skin).     112/59, denies SOB, denies dizziness.  Protocols used: Stools - Unusual Color-A-AH

## 2024-01-24 ENCOUNTER — Ambulatory Visit (HOSPITAL_BASED_OUTPATIENT_CLINIC_OR_DEPARTMENT_OTHER)
Admission: RE | Admit: 2024-01-24 | Discharge: 2024-01-24 | Disposition: A | Discharge status: Home / Self Care | Source: Ambulatory Visit | Attending: Medical | Admitting: Medical

## 2024-01-24 ENCOUNTER — Ambulatory Visit: Payer: Self-pay | Admitting: Medical

## 2024-01-24 ENCOUNTER — Ambulatory Visit

## 2024-01-24 ENCOUNTER — Ambulatory Visit (INDEPENDENT_AMBULATORY_CARE_PROVIDER_SITE_OTHER): Admitting: Medical

## 2024-01-24 VITALS — BP 118/60 | HR 64 | Temp 97.8°F | Resp 14 | Ht 67.5 in | Wt 219.4 lb

## 2024-01-24 DIAGNOSIS — I509 Heart failure, unspecified: Secondary | ICD-10-CM

## 2024-01-24 DIAGNOSIS — D649 Anemia, unspecified: Secondary | ICD-10-CM

## 2024-01-24 DIAGNOSIS — E559 Vitamin D deficiency, unspecified: Secondary | ICD-10-CM | POA: Diagnosis not present

## 2024-01-24 DIAGNOSIS — I480 Paroxysmal atrial fibrillation: Secondary | ICD-10-CM | POA: Diagnosis not present

## 2024-01-24 DIAGNOSIS — R600 Localized edema: Secondary | ICD-10-CM | POA: Diagnosis not present

## 2024-01-24 DIAGNOSIS — I5032 Chronic diastolic (congestive) heart failure: Secondary | ICD-10-CM | POA: Insufficient documentation

## 2024-01-24 LAB — CBC WITH DIFFERENTIAL/PLATELET
Basophils Absolute: 0.1 K/uL (ref 0.0–0.1)
Basophils Relative: 1 % (ref 0.0–3.0)
Eosinophils Absolute: 0.1 K/uL (ref 0.0–0.7)
Eosinophils Relative: 2 % (ref 0.0–5.0)
HCT: 36.5 % (ref 36.0–46.0)
Hemoglobin: 11.8 g/dL — ABNORMAL LOW (ref 12.0–15.0)
Lymphocytes Relative: 14 % (ref 12.0–46.0)
Lymphs Abs: 0.8 K/uL (ref 0.7–4.0)
MCHC: 32.3 g/dL (ref 30.0–36.0)
MCV: 86.4 fl (ref 78.0–100.0)
Monocytes Absolute: 0.6 K/uL (ref 0.1–1.0)
Monocytes Relative: 10 % (ref 3.0–12.0)
Neutro Abs: 4.2 K/uL (ref 1.4–7.7)
Neutrophils Relative %: 73 % (ref 43.0–77.0)
Platelets: 275 K/uL (ref 150.0–400.0)
RBC: 4.22 Mil/uL (ref 3.87–5.11)
RDW: 19.7 % — ABNORMAL HIGH (ref 11.5–15.5)
WBC: 5.7 K/uL (ref 4.0–10.5)

## 2024-01-24 LAB — COMPREHENSIVE METABOLIC PANEL WITH GFR
ALT: 34 U/L (ref 3–35)
AST: 26 U/L (ref 5–37)
Albumin: 4.1 g/dL (ref 3.5–5.2)
Alkaline Phosphatase: 69 U/L (ref 39–117)
BUN: 16 mg/dL (ref 6–23)
CO2: 30 meq/L (ref 19–32)
Calcium: 9.3 mg/dL (ref 8.4–10.5)
Chloride: 102 meq/L (ref 96–112)
Creatinine, Ser: 0.64 mg/dL (ref 0.40–1.20)
GFR: 86.04 mL/min
Glucose, Bld: 89 mg/dL (ref 70–99)
Potassium: 3.8 meq/L (ref 3.5–5.1)
Sodium: 139 meq/L (ref 135–145)
Total Bilirubin: 0.4 mg/dL (ref 0.2–1.2)
Total Protein: 6.8 g/dL (ref 6.0–8.3)

## 2024-01-24 LAB — VITAMIN D 25 HYDROXY (VIT D DEFICIENCY, FRACTURES): VITD: 37.77 ng/mL (ref 30.00–100.00)

## 2024-01-24 LAB — BRAIN NATRIURETIC PEPTIDE: Pro B Natriuretic peptide (BNP): 254 pg/mL — ABNORMAL HIGH (ref 1.0–100.0)

## 2024-01-24 NOTE — Progress Notes (Signed)
 "  Subjective:    Patient ID: Veronica Bradford, female    DOB: 1945-08-13, 79 y.o.   MRN: [id number]  HPI     Pt CC below below Stool Color Change Was in the hospital and DC on Monday says blood count was low has been having Dark stools worried that blood      Labs Only Also wants labs says she had fluid on her lungs the other week and wants to make sure it is not coming back      Veronica Bradford is a 79 year old female with atrial fibrillation and  probable gastrointestinal bleeding who presents with concerns of recurrent anemia and dark stools.  She has atrial fibrillation treated with anticoagulants for about ten years, initially Xarelto  and now Eliquis . Before Christmas she was admitted to [facility] for a large hemoglobin drop and received 2 units of blood. Upper and lower endoscopy did not find a bleeding source. Recent hemoglobin values over the past week were 11.5, 9.7, and 10.6. She takes oral iron  twice daily. Desrcribes getting transfusions.  She notes dark stools that she previously attributed to iron , but they have recently become darker. She has no abdominal pain. She started a new proton pump inhibitor once daily after previously taking Protonix  twice daily. She is worried she may need more transfusions.  She was recently hospitalized for cardioversion and pacemaker adjustments. Afterward she had severe headaches and shortness of breath and went to the emergency department. Head and chest CT were negative for clots. Imaging showed pulmonary congestion, and she was treated with diuresis. Her BNP was elevated. She now feels her breathing has improved. She monitors her weight and uses Lasix  as needed. HA has also subsided. Yesterday mild ha that responded to tylenol .       Review of Systems  Constitutional:  Positive for fatigue.  Respiratory:  Positive for shortness of breath. Negative for cough, choking and wheezing.        Some but improved since recent ED evaluation and  treatment.  Cardiovascular:  Negative for chest pain and palpitations.  Gastrointestinal:  Positive for blood in stool. Negative for abdominal distention, abdominal pain, diarrhea, nausea and vomiting.  Genitourinary:  Negative for dysuria and flank pain.  Musculoskeletal:  Negative for back pain and myalgias.  Skin:  Negative for rash.  Neurological:  Negative for dizziness and headaches.       No current ha but some recently.  Hematological:  Negative for adenopathy.  Psychiatric/Behavioral:  Negative for behavioral problems, confusion and decreased concentration.     Past Medical History:  Diagnosis Date   Allergy    Anemia 2021   Arthritis    Atrial fibrillation (HCC)    Atrial fibrillation (HCC)    Back pain    Blood transfusion without reported diagnosis    Cataract 2021   Removed   CHF (congestive heart failure) (HCC)    grade 2 HF   Chronic sinusitis    s/p 2 surgeries remotely, Dr Ethyl   Closed fracture of metatarsal of left foot    L foot, fifth metatarsal   Complication of anesthesia    woke up once during procedure   Dysrhythmia    a-fib   Ectopic pregnancy    Endometrial cancer (HCC) 2004   Early diagnosis   GERD (gastroesophageal reflux disease)    Heart murmur    Hyperlipemia    Hypertension    IBS (irritable bowel syndrome) 08/13/2014   Dx  2015, diarrhea on-off, Dr Donnald    Lumbar radiculopathy 08/2012   MRI in 08/2012   Mild hyperparathyroidism    Osteopenia    PAF (paroxysmal atrial fibrillation) (HCC)    Presence of permanent cardiac pacemaker    Sciatica of right side 08/2013   Received Depomedrol 80 mg injection   Sleep apnea 09/09/2013   Dx with OSA in 2015, by Dr. 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  Tobacco Use   Smoking status: Former    Current packs/day: 0.00    Average packs/day: 0.5 packs/day for 5.0 years (2.5 ttl pk-yrs)    Types: Cigarettes    Quit date: 01/06/1980    Years since quitting: 44.0   Smokeless tobacco: Never   Tobacco comments:    smoked from 1970 to 1982, less than 1 ppd  Vaping Use   Vaping status: Never Used  Substance and Sexual Activity   Alcohol  use: Not Currently    Alcohol /week: 1.0 standard drink of alcohol     Types: 1 Glasses of wine per week    Comment: rare   Drug use: Never   Sexual activity: Not Currently    Birth control/protection: Post-menopausal, None  Other Topics Concern   Not on file  Social History Narrative   Lives w/ husband, and son Marolyn   Retired engineer, civil (consulting)   Lives in [place]   Social Drivers of Health   Tobacco Use: Medium Risk (01/19/2024)   Patient History    Smoking Tobacco Use: Former    Smokeless Tobacco Use: Never    Passive Exposure: Not on file  Financial Resource Strain: Low Risk (01/24/2024)   Overall Financial Resource Strain (CARDIA)    Difficulty of Paying Living Expenses: Not hard at all  Food Insecurity: No Food Insecurity (01/24/2024)   Epic    Worried About Radiation Protection Practitioner of Food in the Last Year: Never true    Ran Out of Food in the Last Year: Never true  Transportation Needs: No Transportation Needs (01/24/2024)   Epic    Lack of Transportation (Medical): No    Lack of Transportation (Non-Medical): No  Physical Activity: Insufficiently Active (01/24/2024)   Exercise Vital Sign    Days of Exercise per Week: 4 days    Minutes of Exercise per Session: 30 min  Stress: No Stress Concern Present (01/24/2024)   [employer] of Occupational Health - Occupational Stress Questionnaire    Feeling of Stress: Only a little  Social Connections: Moderately Integrated (01/24/2024)   Social Connection and Isolation Panel    Frequency of Communication with Friends and Family: More  than three times a week    Frequency of Social Gatherings with Friends and Family: More than three times a week    Attends Religious Services: 1 to 4 times per year    Active Member of [employer] or Organizations: No    Attends Engineer, Structural: Not on file    Marital Status: Married  Recent Concern: Social Connections - Moderately Isolated (01/19/2024)   Social Connection and Isolation Panel    Frequency of Communication with Friends and Family: More than three times a week    Frequency of Social Gatherings with  Friends and Family: More than three times a week    Attends Religious Services: Never    Active Member of Clubs or Organizations: No    Attends Banker Meetings: Never    Marital Status: Married  Catering Manager Violence: Not At Risk (01/22/2024)   Epic    Fear of Current or Ex-Partner: No    Emotionally Abused: No    Physically Abused: No    Sexually Abused: No  Depression (PHQ2-9): Low Risk (01/22/2024)   Depression (PHQ2-9)    PHQ-2 Score: 3  Recent Concern: Depression (PHQ2-9) - Medium Risk (12/28/2023)   Depression (PHQ2-9)    PHQ-2 Score: 6  Alcohol  Screen: Low Risk (07/23/2023)   Alcohol  Screen    Last Alcohol  Screening Score (AUDIT): 2  Housing: Unknown (01/24/2024)   Epic    Unable to Pay for Housing in the Last Year: No    Number of Times Moved in the Last Year: Not on file    Homeless in the Last Year: No  Utilities: Not At Risk (01/22/2024)   Epic    Threatened with loss of utilities: No  Health Literacy: Adequate Health Literacy (06/27/2023)   B1300 Health Literacy    Frequency of need for help with medical instructions: Never    Past Surgical History:  Procedure Laterality Date   ABDOMINAL HYSTERECTOMY     ATRIAL FIBRILLATION ABLATION N/A 09/04/2017   Procedure: ATRIAL FIBRILLATION ABLATION;  Surgeon: Kelsie Agent, MD;  Location: [facility] INVASIVE CV LAB;  Service: Cardiovascular;  Laterality: N/A;   ATRIAL FIBRILLATION ABLATION N/A  08/01/2023   Procedure: ATRIAL FIBRILLATION ABLATION;  Surgeon: Kennyth Chew, MD;  Location: [facility] INVASIVE CV LAB;  Service: Cardiovascular;  Laterality: N/A;   BIOPSY  03/11/2020   Procedure: BIOPSY;  Surgeon: Donnald Charleston, MD;  Location: [facility] ENDOSCOPY;  Service: Endoscopy;;  EGD and COLON   CARDIOVERSION N/A 01/16/2024   Procedure: CARDIOVERSION;  Surgeon: Ren Donley Joelle VEAR, MD;  Location: [facility] INVASIVE CV LAB;  Service: Cardiovascular;  Laterality: N/A;   CARPAL TUNNEL RELEASE Right 06/17/2019   CARPAL TUNNEL RELEASE Left 11/2020   CHOLECYSTECTOMY  ~2006   COLONOSCOPY N/A 12/22/2023   Procedure: COLONOSCOPY;  Surgeon: Rosalie Kitchens, MD;  Location: [facility] ENDOSCOPY;  Service: Gastroenterology;  Laterality: N/A;   COLONOSCOPY WITH PROPOFOL  N/A 03/11/2020   Procedure: COLONOSCOPY WITH PROPOFOL ;  Surgeon: Donnald Charleston, MD;  Location: [facility] ENDOSCOPY;  Service: Endoscopy;  Laterality: N/A;   DILATION AND CURETTAGE OF UTERUS     after SAB   ESOPHAGOGASTRODUODENOSCOPY  02/09/2014   Dr. Donnald   ESOPHAGOGASTRODUODENOSCOPY N/A 12/21/2023   Procedure: EGD (ESOPHAGOGASTRODUODENOSCOPY);  Surgeon: Rosalie Kitchens, MD;  Location: [facility] ENDOSCOPY;  Service: Gastroenterology;  Laterality: N/A;   ESOPHAGOGASTRODUODENOSCOPY (EGD) WITH PROPOFOL  N/A 03/11/2020   Procedure: ESOPHAGOGASTRODUODENOSCOPY (EGD) WITH PROPOFOL ;  Surgeon: Donnald Charleston, MD;  Location: [facility] ENDOSCOPY;  Service: Endoscopy;  Laterality: N/A;   EYE SURGERY  2021   FINGER SURGERY Left    index   FRACTURE SURGERY  07/18/2021   L total hip replacement   JOINT REPLACEMENT     NASAL SINUS SURGERY     x 2 remotely, Dr Ethyl   PACEMAKER IMPLANT N/A 09/13/2020   Procedure: PACEMAKER IMPLANT;  Surgeon: Fernande Elspeth BROCKS, MD;  [facility] INVASIVE CV LAB;  Service: Cardiovascular;  Laterality: N/A;   PARATHYROIDECTOMY Left 09/14/2022   Procedure: LEFT PARATHYROIDECTOMY;  Surgeon: Eletha Boas, MD;  Location: [facility] ORS;  Service: General;  Laterality:  Left;  TONSILLECTOMY     TOTAL ABDOMINAL HYSTERECTOMY W/ BILATERAL SALPINGOOPHORECTOMY     TOTAL HIP ARTHROPLASTY Left 07/18/2021   Procedure: TOTAL HIP ARTHROPLASTY ANTERIOR APPROACH;  Surgeon: Fidel Rogue, MD;  Location: [facility] ORS;  Service: Orthopedics;  Laterality: Left;   TOTAL KNEE ARTHROPLASTY Right ~2008    Family History  Problem Relation Age of Onset   Atrial fibrillation Mother    Hypertension Mother    Colon cancer Mother    Breast cancer Mother    Ovarian cancer Mother    Cancer Mother    Hearing loss Mother    Varicose Veins Mother    Hypertension Father    Diabetes Father    Alcohol  abuse Father    Vision loss Father    Head & neck cancer Sister    Birth defects Son    Intellectual disability Son    CAD Neg Hx     Allergies[1]  Medications Ordered Prior to Encounter[2]  BP 118/60   Pulse 64   Temp 97.8 F (36.6 C) (Oral)   Resp 14   Ht 5' 7.5 (1.715 m)   Wt 219 lb 6.4 oz (99.5 kg)   SpO2 99%   BMI 33.86 kg/m        Objective:   Physical Exam  General Mental Status- Alert. General Appearance- Not in acute distress.   Skin General: Color- Normal Color. Moisture- Normal Moisture.  Neck Carotid Arteries- Normal color. Moisture- Normal Moisture. No carotid bruits. No JVD.  Chest and Lung Exam Auscultation: Breath Sounds:-CTA  Cardiovascular Auscultation:Rythm- RRR Murmurs & Other Heart Sounds:Auscultation of the heart reveals- No Murmurs.  Abdomen Inspection:-Inspeection Normal. Palpation/Percussion:Note:No mass. Palpation and Percussion of the abdomen reveal- Non Tender, Non Distended + BS, no rebound or guarding.   Neurologic Cranial Nerve exam:- CN III-XII intact(No nystagmus), symmetric smile. Strength:- 5/5 equal and symmetric strength both upper and lower extremities.   Lower ext- calfs symmetric, 1+ pedal edema bilaterally. Negative homans signs.    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 Hemoglobin around 10 g/dL. Previous GI workup negative for bleeding source. Dark stools likely from iron  supplementation. Potential transfusion if hemoglobin decreases. - Ordered stat CBC for hemoglobin assessment. - Ordered ifob card for occult blood in stool. - Communicate with GI doctor regarding ifob results and potential capsule endoscopy if positive or if anemia worsening - Discuss with cardiologist potential Watchman device placement if bleeding persists.  Chf with recent pulmonary congestion(mild pedal edema presently). Negative homans signs. Recent pulmonary congestion episode with shortness of breath and elevated BNP. Recent cardioversion and pacemaker adjustments may have contributed. Since diuresis in ED some improvement. - Ordered metabolic panel for kidney function stat and electrolytes. - Ordered repeat chest x-ray for fluid reaccumulating. - Continue Lasix  as needed for fluid management. -continue current meds  Paroxysmal atrial fibrillation and atrial flutter Recent cardioversion and pacemaker adjustments.. Pt concerns about pacemaker syndrome and headaches. Discussed potential Watchman device placement due to bleeding risk. - Continue follow up with cardiologist regarding pacemaker settings and potential Watchman device placement.  Follow up date to be determined after stat lab review  Dallas Maxwell, PA-C   I personally spent a total of 48 minutes in the care of the patient today including getting/reviewing separately obtained history, performing a medically appropriate exam/evaluation, counseling and educating, placing orders, and documenting clinical information in the EHR.     [1]  Allergies Allergen Reactions   Ibuprofen Other (See Comments)    Pt is on Xarelto    Nsaids Other (See Comments)    Pt on Xarelto    Tolmetin Other (See Comments)    Pt on Xarelto    Zoster Vaccine Live  Other (See Comments)    Serum sickness, high fever, neck pain    Tape Other (See Comments)    Causes blisters    Amoxicillin Rash   Lactose Intolerance (Gi) Diarrhea and Nausea And Vomiting   Lisinopril Cough   Penicillins Rash    Local reaction  [2]  Current Outpatient Medications on File Prior to Visit  Medication Sig Dispense Refill   acetaminophen  (TYLENOL ) 500 MG tablet Take 500 mg by mouth every 6 (six) hours as needed for moderate pain or headache.     acetaminophen -codeine  (TYLENOL  #3) 300-30 MG tablet Take 0.5-1 tablets by mouth every 6 (six) hours as needed for moderate pain (pain score 4-6).     apixaban  (ELIQUIS ) 5 MG TABS tablet Take 1 tablet (5 mg total) by mouth 2 (two) times daily. 180 tablet 0   ARTIFICIAL TEAR SOLUTION OP Place 1 drop into both eyes 4 (four) times daily as needed (dry/irritated eyes.).     B Complex-C (SUPER B COMPLEX/VITAMIN C PO) Take 1 tablet by mouth daily.     Cholecalciferol  (VITAMIN D3) 50 MCG (2000 UT) capsule Take 2,000 Units by mouth daily.     Coenzyme Q10 (COQ10) 200 MG CAPS Take 200 mg by mouth daily.     colestipol  (COLESTID ) 1 g tablet Take 2 g by mouth 2 (two) times daily.     CRANBERRY EXTRACT PO Take 1 tablet by mouth 2 (two) times daily.     estradiol  (ESTRACE ) 0.1 MG/GM vaginal cream Place 1 Applicatorful vaginally 3 (three) times a week. 42.5 g 12   Fe Fum-FA-B Cmp-C-Zn-Mg-Mn-Cu (HEMOCYTE PLUS) 106-1 MG CAPS Take 1 capsule by mouth 2 (two) times daily.     flecainide  (TAMBOCOR ) 100 MG tablet Take 1 tablet (100 mg total) by mouth 2 (two) times daily. 180 tablet 3   fluticasone  (FLONASE ) 50 MCG/ACT nasal spray Place 1 spray into both nostrils daily.     furosemide  (LASIX ) 40 MG tablet TAKE 1 TABLET BY MOUTH AS NEEDED (Patient taking differently: Take 20 mg by mouth as needed.) 90 tablet 3   MAGNESIUM  GLYCINATE PO Take 1,000 mg by mouth See admin instructions. Take one tablet by mouth twice a week     metoprolol  succinate (TOPROL -XL) 50  MG 24 hr tablet Take 0.5 tablets (25 mg total) by mouth 2 (two) times daily. Take with or immediately following a meal.     Multiple Vitamins-Minerals (SENIOR MULTIVITAMIN PLUS PO) Take 1 tablet by mouth in the morning.     pantoprazole  (PROTONIX ) 40 MG tablet Take 1 tablet (40 mg total) by mouth 2 (two) times daily.     potassium chloride  SA (KLOR-CON  M) 20 MEQ tablet Take 1 tablet (20 mEq total) by mouth daily. 90 tablet 2   pravastatin  (PRAVACHOL ) 40 MG tablet Take 1 tablet (40 mg total) by mouth every evening. 90 tablet 3   sucralfate  (CARAFATE ) 1 g tablet Take 1 g by mouth 2 (two) times daily as needed (upset stomach).     tirzepatide  (ZEPBOUND ) 7.5 MG/0.5ML Pen Inject 7.5 mg into the skin once a week. (Patient taking differently: Inject 7.5 mg into the skin every Sunday.) 2 mL 3   Wheat Dextrin (BENEFIBER) CHEW Chew 2 each  by mouth every evening.     Wheat Dextrin (BENEFIBER) POWD Take 1 Scoop by mouth in the morning. Mix one scoop of powder with morning coffee and drink daily.     No current facility-administered medications on file prior to visit.   "

## 2024-01-24 NOTE — Patient Instructions (Addendum)
 Anemia due to chronic gastrointestinal blood loss Chronic anemia likely from GI blood loss, possibly due to anticoagulant use. Hemoglobin around 10 g/dL. Previous GI workup negative for bleeding source. Dark stools likely from iron  supplementation. Potential transfusion if hemoglobin decreases. - Ordered stat CBC for hemoglobin assessment. - Ordered ifob card for occult blood in stool. - Communicate with GI doctor regarding ifob results and potential capsule endoscopy if positive or if anemia worsening - Discuss with cardiologist potential Watchman device placement if bleeding persists.  Chf with recent pulmonary congestion(mild pedal edema presently). Negative homans signs. Recent pulmonary congestion episode with shortness of breath and elevated BNP. Recent cardioversion and pacemaker adjustments may have contributed. Since diuresis in ED some improvement. Stable appearing today - Ordered metabolic panel for kidney function stat and electrolytes. - Ordered repeat chest x-ray for fluid reaccumulating. - Continue Lasix  as needed for fluid management. -continue current meds  Paroxysmal atrial fibrillation and atrial flutter Recent cardioversion and pacemaker adjustments.. Pt concerns about pacemaker syndrome and headaches. Discussed potential Watchman device placement due to bleeding risk. - Continue follow up with cardiologist regarding pacemaker settings and potential Watchman device placement.  Follow up date to be determined after stat lab review

## 2024-01-25 LAB — IRON,TIBC AND FERRITIN PANEL
%SAT: 11 % — ABNORMAL LOW (ref 16–45)
Ferritin: 194 ng/mL (ref 16–288)
Iron: 39 ug/dL — ABNORMAL LOW (ref 45–160)
TIBC: 344 ug/dL (ref 250–450)

## 2024-01-28 ENCOUNTER — Inpatient Hospital Stay: Admitting: Family Medicine

## 2024-02-01 ENCOUNTER — Ambulatory Visit: Admitting: Family Medicine

## 2024-02-01 ENCOUNTER — Encounter: Payer: Self-pay | Admitting: Family Medicine

## 2024-02-01 VITALS — BP 126/82 | HR 75 | Temp 97.8°F | Resp 16 | Ht 67.0 in | Wt 217.0 lb

## 2024-02-01 DIAGNOSIS — H04301 Unspecified dacryocystitis of right lacrimal passage: Secondary | ICD-10-CM | POA: Diagnosis not present

## 2024-02-01 MED ORDER — POLYMYXIN B-TRIMETHOPRIM 10000-0.1 UNIT/ML-% OP SOLN: 2.0000 [drp] | OPHTHALMIC | 0 refills | Status: AC

## 2024-02-01 MED ORDER — CEPHALEXIN 500 MG PO CAPS: 500.0000 mg | ORAL_CAPSULE | Freq: Three times a day (TID) | ORAL | 0 refills | Status: AC

## 2024-02-01 NOTE — Patient Instructions (Signed)
 Continue warm compresses as needed for 10-15 min.  Artificial tears like Refresh and Systane may be used for comfort. OK to get generic version. Generally people use them every 2-4 hours, but you can use them as much as you want because there is no medication in it.  Let us  know if you need anything.

## 2024-02-01 NOTE — Progress Notes (Signed)
 Chief Complaint  Patient presents with   Eye Drainage    Eye Drainage    Veronica Bradford is here for right eye irritation.  Duration: 5 days Drainage/tearing some yellowish d/c Chemical exposure? No  Recent URI? No  Contact lenses? No  History of allergies? Yes  Treatment to date: erythromycin  ointment, warm compresses No vision changes or trauma.   Past Medical History:  Diagnosis Date   Allergy    Anemia 2021   Arthritis    Atrial fibrillation (HCC)    Atrial fibrillation (HCC)    Back pain    Blood transfusion without reported diagnosis    Cataract 2021   Removed   CHF (congestive heart failure) (HCC)    grade 2 HF   Chronic sinusitis    s/p 2 surgeries remotely, Dr Ethyl   Closed fracture of metatarsal of left foot    L foot, fifth metatarsal   Complication of anesthesia    woke up once during procedure   Dysrhythmia    a-fib   Ectopic pregnancy    Endometrial cancer (HCC) 2004   Early diagnosis   GERD (gastroesophageal reflux disease)    Heart murmur    Hyperlipemia    Hypertension    IBS (irritable bowel syndrome) 08/13/2014   Dx 2015, diarrhea on-off, Dr Donnald    Lumbar radiculopathy 08/2012   MRI in 08/2012   Mild hyperparathyroidism    Osteopenia    PAF (paroxysmal atrial fibrillation) (HCC)    Presence of permanent cardiac pacemaker    Sciatica of right side 08/2013   Received Depomedrol 80 mg injection   Sleep apnea 09/09/2013   Dx with OSA in 2015, by Dr. Fernande,   wears CPAPAPAP   Family History  Problem Relation Age of Onset   Atrial fibrillation Mother    Hypertension Mother    Colon cancer Mother    Breast cancer Mother    Ovarian cancer Mother    Cancer Mother    Hearing loss Mother    Varicose Veins Mother    Hypertension Father    Diabetes Father    Alcohol  abuse Father    Vision loss Father    Head & neck cancer Sister    Birth defects Son    Intellectual disability Son    CAD Neg Hx     BP 126/82 (BP Location: Left Arm,  Patient Position: Sitting)   Pulse 75   Temp 97.8 F (36.6 C) (Oral)   Resp 16   Ht 5' 7 (1.702 m)   Wt 217 lb (98.4 kg)   SpO2 97%   BMI 33.99 kg/m  Gen: Awake, alert, appears stated age Eyes: Lids neg, Sclera white, lacrimal caruncle edematous on R compared to the L, PERRLA, EOMi Skin: Mild edema and erythema over the medial/prox max sinus region with slight ttp Nose: Nares patent without discharge Mouth: MMM, pharynx without erythema or exudate Psych: Age appropriate judgment and insight; mood and affect normal  Dacrocystitis, right - Plan: trimethoprim -polymyxin b  (POLYTRIM ) ophthalmic solution, cephALEXin  (KEFLEX ) 500 MG capsule  Drops as above, Keflex  TID for 7 d. Has tolerated well in the past.  Instructed to practice good hand hygiene and try not to touch face. Warm compresses and artificial tears also recommended. F/u if no improvement in 7-10 days. Pt voiced understanding and agreement to the plan.  Mabel Mt Toyah, DO 02/01/24 3:44 PM

## 2024-02-03 ENCOUNTER — Encounter (HOSPITAL_BASED_OUTPATIENT_CLINIC_OR_DEPARTMENT_OTHER): Payer: Self-pay | Admitting: Emergency Medicine

## 2024-02-03 ENCOUNTER — Other Ambulatory Visit: Payer: Self-pay

## 2024-02-03 ENCOUNTER — Emergency Department (HOSPITAL_BASED_OUTPATIENT_CLINIC_OR_DEPARTMENT_OTHER)
Admission: EM | Admit: 2024-02-03 | Discharge: 2024-02-03 | Disposition: A | Discharge status: Home / Self Care | Attending: Emergency Medicine | Admitting: Emergency Medicine

## 2024-02-03 DIAGNOSIS — H5711 Ocular pain, right eye: Secondary | ICD-10-CM | POA: Diagnosis present

## 2024-02-03 DIAGNOSIS — R519 Headache, unspecified: Secondary | ICD-10-CM | POA: Diagnosis not present

## 2024-02-03 MED ORDER — DOXYCYCLINE HYCLATE 100 MG PO CAPS: 100.0000 mg | ORAL_CAPSULE | Freq: Two times a day (BID) | ORAL | 0 refills | Status: AC

## 2024-02-03 MED ORDER — ERYTHROMYCIN 5 MG/GM OP OINT: TOPICAL_OINTMENT | OPHTHALMIC | 0 refills | Status: AC

## 2024-02-03 MED ORDER — KETOROLAC TROMETHAMINE 15 MG/ML IJ SOLN
15.0000 mg | Freq: Once | INTRAMUSCULAR | Status: AC
  Administered 2024-02-03: 15 mg via INTRAMUSCULAR
  Filled 2024-02-03: qty 1

## 2024-02-03 MED ORDER — OXYCODONE HCL 5 MG PO TABS
5.0000 mg | ORAL_TABLET | Freq: Once | ORAL | Status: AC
  Administered 2024-02-03: 5 mg via ORAL
  Filled 2024-02-03: qty 1

## 2024-02-03 MED ORDER — METHYLPREDNISOLONE 4 MG PO TBPK: ORAL_TABLET | ORAL | 0 refills | Status: AC

## 2024-02-03 NOTE — Discharge Instructions (Addendum)
 Warm compresses at least 4 times a day.  I have changed your antibiotic regimen.  Please call the eye doctor in the morning.  See when they can see you in clinic.  Hopefully the steroids help a lot with your discomfort.  Please return for uncontrolled pain rapid spreading redness or if you develop a fever.

## 2024-02-03 NOTE — ED Triage Notes (Signed)
 Pt with c/o pain under RT eye (dx with dacrocystitis on 1/30); using Tylenol  for pain, but it's not helping; took hydrocodone  w/o relief

## 2024-02-03 NOTE — ED Provider Notes (Signed)
 " [place] [facility] Provider Note   CSN: [id number] Arrival date & time: 02/03/24  2052     Patient presents with: Facial Pain   Veronica Bradford is a 79 y.o. female.   79 yo F with a chief complaints of right eye pain.  Said has been watering for about a week.  Seen her family doctor and was evaluated.  Thought to be an inflamed tear duct.  Started on Keflex  and Polytrim  drops.  She was having pain with Polytrim  drop administration and so self stopped this a couple days ago.  She has been on the antibiotics for about 3 days and felt like things maybe were getting better but started having worsening pain yesterday.  She feeling the pain was unbearable today.  She tried a couple different narcotic pills that she had leftover at home.  Did not feel like they were helping and so came here for evaluation.  She tells me she does have a history of migraines and used to have pain in a similar area.  She denies rash elsewhere.  Denies pain in the ear.        Prior to Admission medications  Medication Sig Start Date End Date Taking? Authorizing Provider  doxycycline  (VIBRAMYCIN ) 100 MG capsule Take 1 capsule (100 mg total) by mouth 2 (two) times daily. One po bid x 7 days 02/03/24  Yes Emil Share, DO  erythromycin  ophthalmic ointment Place a 1/2 inch ribbon of ointment into the lower eyelid four times a day. 02/03/24  Yes Emil Share, DO  methylPREDNISolone  (MEDROL  DOSEPAK) 4 MG TBPK tablet Day 1: 8mg  before breakfast, 4 mg after lunch, 4 mg after supper, and 8 mg at bedtime Day 2: 4 mg before breakfast, 4 mg after lunch, 4 mg  after supper, and 8 mg  at bedtime Day 3:  4 mg  before breakfast, 4 mg  after lunch, 4 mg after supper, and 4 mg  at bedtime Day 4: 4 mg  before breakfast, 4 mg  after lunch, and 4 mg at bedtime Day 5: 4 mg  before breakfast and 4 mg at bedtime Day 6: 4 mg  before breakfast 02/03/24  Yes Damarkus Balis, DO  acetaminophen  (TYLENOL ) 500 MG tablet Take  500 mg by mouth every 6 (six) hours as needed for moderate pain or headache.    [provider]  acetaminophen -codeine  (TYLENOL  #3) 300-30 MG tablet Take 0.5-1 tablets by mouth every 6 (six) hours as needed for moderate pain (pain score 4-6).    [provider]  apixaban  (ELIQUIS ) 5 MG TABS tablet Take 1 tablet (5 mg total) by mouth 2 (two) times daily. 12/24/23   Sebastian Toribio GAILS, MD  ARTIFICIAL TEAR SOLUTION OP Place 1 drop into both eyes 4 (four) times daily as needed (dry/irritated eyes.).    [provider]  B Complex-C (SUPER B COMPLEX/VITAMIN C PO) Take 1 tablet by mouth daily.    [provider]  cephALEXin  (KEFLEX ) 500 MG capsule Take 1 capsule (500 mg total) by mouth 3 (three) times daily for 7 days. 02/01/24 02/08/24  Frann Mabel Mt, DO  Cholecalciferol  (VITAMIN D3) 50 MCG (2000 UT) capsule Take 2,000 Units by mouth daily.    [provider]  Coenzyme Q10 (COQ10) 200 MG CAPS Take 200 mg by mouth daily.    [provider]  colestipol  (COLESTID ) 1 g tablet Take 2 g by mouth 2 (two) times daily.  [provider]  CRANBERRY EXTRACT PO Take 1 tablet by mouth 2 (two) times daily.    [provider]  estradiol  (ESTRACE ) 0.1 MG/GM vaginal cream Place 1 Applicatorful vaginally 3 (three) times a week. 11/10/22   Lowne Chase, Yvonne R, DO  Fe Fum-FA-B Cmp-C-Zn-Mg-Mn-Cu (HEMOCYTE PLUS) 106-1 MG CAPS Take 1 capsule by mouth 2 (two) times daily. 12/25/23   [provider]  flecainide  (TAMBOCOR ) 100 MG tablet Take 1 tablet (100 mg total) by mouth 2 (two) times daily. 01/14/24   Kennyth Chew, MD  fluticasone  (FLONASE ) 50 MCG/ACT nasal spray Place 1 spray into both nostrils daily.    [provider]  furosemide  (LASIX ) 40 MG tablet TAKE 1 TABLET BY MOUTH AS NEEDED Patient taking differently: Take 20 mg by mouth as needed. 04/10/23   Fernande Elspeth BROCKS, MD  MAGNESIUM  GLYCINATE PO Take 1,000 mg by mouth See admin  instructions. Take one tablet by mouth twice a week    [provider]  metoprolol  succinate (TOPROL -XL) 50 MG 24 hr tablet Take 0.5 tablets (25 mg total) by mouth 2 (two) times daily. Take with or immediately following a meal. 01/21/24   Fairy Frames, MD  Multiple Vitamins-Minerals (SENIOR MULTIVITAMIN PLUS PO) Take 1 tablet by mouth in the morning.    [provider]  pantoprazole  (PROTONIX ) 40 MG tablet Take 1 tablet (40 mg total) by mouth 2 (two) times daily. 01/27/21   Dow Arland BROCKS, NP  potassium chloride  SA (KLOR-CON  M) 20 MEQ tablet Take 1 tablet (20 mEq total) by mouth daily. 10/24/23   Terra Fairy PARAS, PA-C  pravastatin  (PRAVACHOL ) 40 MG tablet Take 1 tablet (40 mg total) by mouth every evening. 11/27/23   Kennyth Chew, MD  sucralfate  (CARAFATE ) 1 g tablet Take 1 g by mouth 2 (two) times daily as needed (upset stomach).    [provider]  tirzepatide  (ZEPBOUND ) 7.5 MG/0.5ML Pen Inject 7.5 mg into the skin once a week. Patient taking differently: Inject 7.5 mg into the skin every Sunday. 10/31/23   Paz, Jose E, MD  trimethoprim -polymyxin b  (POLYTRIM ) ophthalmic solution Place 2 drops into the right eye every 4 (four) hours. 02/01/24   Wendling, Mabel Mt, DO  Wheat Dextrin (BENEFIBER) CHEW Chew 2 each by mouth every evening.    [provider]  Wheat Dextrin (BENEFIBER) POWD Take 1 Scoop by mouth in the morning. Mix one scoop of powder with morning coffee and drink daily.    [provider]    Allergies: Ibuprofen, Nsaids, Tolmetin, Zoster vaccine live, Tape, Amoxicillin, Lactose intolerance (gi), Lisinopril, and Penicillins    Review of Systems  Updated Vital Signs BP (!) 138/56   Pulse 65   Temp 97.9 F (36.6 C)   Resp 16   Ht 5' 7 (1.702 m)   Wt 98.4 kg   SpO2 99%   BMI 33.99 kg/m   Physical Exam Vitals and nursing note reviewed.  Constitutional:      General: She is not in acute distress.    Appearance: She is  well-developed. She is not diaphoretic.  HENT:     Head: Normocephalic and atraumatic.     Mouth/Throat:     Comments: Overall good dentition.  No obvious pain with percussion of the teeth. Eyes:     Pupils: Pupils are equal, round, and reactive to light.      Comments: Patient has some mild erythema underneath the right eye.  She does have some purulent appearing drainage at  the medial canthus.  The eye itself is without any injection.  Extraocular motion intact.  3 mm and reactive.  No significant edema over the maxillary sinus.  Cardiovascular:     Rate and Rhythm: Normal rate and regular rhythm.     Heart sounds: No murmur heard.    No friction rub. No gallop.  Pulmonary:     Effort: Pulmonary effort is normal.     Breath sounds: No wheezing or rales.  Abdominal:     General: There is no distension.     Palpations: Abdomen is soft.     Tenderness: There is no abdominal tenderness.  Musculoskeletal:        General: No tenderness.     Cervical back: Normal range of motion and neck supple.  Skin:    General: Skin is warm and dry.  Neurological:     Mental Status: She is alert and oriented to person, place, and time.  Psychiatric:        Behavior: Behavior normal.     (all labs ordered are listed, but only abnormal results are displayed) Labs Reviewed - No data to display  EKG: None  Radiology: No results found.   Procedures   Medications Ordered in the ED  ketorolac  (TORADOL ) 15 MG/ML injection 15 mg (has no administration in time range)  oxyCODONE  (Oxy IR/ROXICODONE ) immediate release tablet 5 mg (has no administration in time range)                                    Medical Decision Making Risk Prescription drug management.   79 yo F with a cc of right sided facial pain.  Patient was diagnosed with an inflamed tear duct by her PCP.  She has been on antibiotics for about 3 days.  Felt like her pain and got much worse and so came in for evaluation.   Clinically does seem consistent with tear duct obstruction.  I do not see any signs that this is consistent with deep space abscess from dental infection or shingles.  I discussed different modalities for pain control at home.  She is felt that her antibiotic therapy is not improving her symptoms we will change antibiotics here.  She is on Eliquis  will do a course of steroids. Given information for ophthalmology follow-up.  9:40 PM:  I have discussed the diagnosis/risks/treatment options with the patient and family.  Evaluation and diagnostic testing in the emergency department does not suggest an emergent condition requiring admission or immediate intervention beyond what has been performed at this time.  They will follow up with Optho. We also discussed returning to the ED immediately if new or worsening sx occur. We discussed the sx which are most concerning (e.g., sudden worsening pain, fever, inability to tolerate by mouth) that necessitate immediate return. Medications administered to the patient during their visit and any new prescriptions provided to the patient are listed below.  Medications given during this visit Medications  ketorolac  (TORADOL ) 15 MG/ML injection 15 mg (has no administration in time range)  oxyCODONE  (Oxy IR/ROXICODONE ) immediate release tablet 5 mg (has no administration in time range)     The patient appears reasonably screen and/or stabilized for discharge and I doubt any other medical condition or other [facility] requiring further screening, evaluation, or treatment in the ED at this time prior to discharge.      Final diagnoses:  Right facial pain  ED Discharge Orders          Ordered    methylPREDNISolone  (MEDROL  DOSEPAK) 4 MG TBPK tablet        02/03/24 2133    erythromycin  ophthalmic ointment        02/03/24 2134    doxycycline  (VIBRAMYCIN ) 100 MG capsule  2 times daily        02/03/24 2134               Emil Share, DO 02/03/24 2140  "

## 2024-02-04 ENCOUNTER — Inpatient Hospital Stay: Admitting: Family Medicine

## 2024-02-04 DIAGNOSIS — D500 Iron deficiency anemia secondary to blood loss (chronic): Secondary | ICD-10-CM

## 2024-02-04 MED ORDER — TIRZEPATIDE-WEIGHT MANAGEMENT 10 MG/0.5ML ~~LOC~~ SOLN: 10.0000 mg | SUBCUTANEOUS | 2 refills | Status: AC

## 2024-02-05 ENCOUNTER — Telehealth: Payer: Self-pay | Admitting: Internal Medicine

## 2024-02-05 DIAGNOSIS — H04301 Unspecified dacryocystitis of right lacrimal passage: Secondary | ICD-10-CM

## 2024-02-05 LAB — OPHTHALMOLOGY REPORT-SCANNED

## 2024-02-05 NOTE — Telephone Encounter (Signed)
 Copied from CRM [id number]. Topic: Referral - Request for Referral >> Feb 05, 2024 10:56 AM Carlyon D wrote: Did the patient discuss referral with their provider in the last year? Yes (If No - schedule appointment) (If Yes - send message)  Appointment offered? Yes already had an apt with dr frann on 1/30   Type of order/referral and detailed reason for visit: Dr. [facility] ( Has an aPT at 12:30 Today) referred by ER when she went to hospital   Preference of office, provider, location: [address]   If referral order, have you been seen by this specialty before? Yes (If Yes, this issue or another issue? When? Where?  Can we respond through MyChart? Yes

## 2024-02-05 NOTE — Telephone Encounter (Signed)
 Referral placed

## 2024-02-07 ENCOUNTER — Other Ambulatory Visit

## 2024-02-07 ENCOUNTER — Encounter: Payer: Self-pay | Admitting: Family Medicine

## 2024-02-07 DIAGNOSIS — D500 Iron deficiency anemia secondary to blood loss (chronic): Secondary | ICD-10-CM | POA: Diagnosis not present

## 2024-02-07 LAB — CBC
HCT: 38.8 % (ref 36.0–46.0)
Hemoglobin: 12.4 g/dL (ref 12.0–15.0)
MCHC: 32 g/dL (ref 30.0–36.0)
MCV: 89 fl (ref 78.0–100.0)
Platelets: 295 10*3/uL (ref 150.0–400.0)
RBC: 4.36 Mil/uL (ref 3.87–5.11)
RDW: 20 % — ABNORMAL HIGH (ref 11.5–15.5)
WBC: 9.4 10*3/uL (ref 4.0–10.5)

## 2024-02-20 ENCOUNTER — Ambulatory Visit: Admitting: Cardiology

## 2024-03-04 ENCOUNTER — Ambulatory Visit: Admitting: Internal Medicine

## 2024-03-11 ENCOUNTER — Encounter

## 2024-06-10 ENCOUNTER — Encounter

## 2024-07-01 ENCOUNTER — Ambulatory Visit

## 2024-09-09 ENCOUNTER — Encounter
